# Patient Record
Sex: Female | Born: 1970 | Race: Black or African American | Hispanic: No | Smoking: Never smoker
Health system: Southern US, Community
[De-identification: ages and names within clinical notes are randomized; demographics above are authoritative.]

## PROBLEM LIST (undated history)

## (undated) DIAGNOSIS — G5603 Carpal tunnel syndrome, bilateral upper limbs: Secondary | ICD-10-CM

## (undated) DIAGNOSIS — M25731 Osteophyte, right wrist: Secondary | ICD-10-CM

## (undated) DIAGNOSIS — G4730 Sleep apnea, unspecified: Secondary | ICD-10-CM

## (undated) DIAGNOSIS — M3500 Sicca syndrome, unspecified: Secondary | ICD-10-CM

## (undated) DIAGNOSIS — M1990 Unspecified osteoarthritis, unspecified site: Secondary | ICD-10-CM

## (undated) DIAGNOSIS — K219 Gastro-esophageal reflux disease without esophagitis: Secondary | ICD-10-CM

## (undated) DIAGNOSIS — J45998 Other asthma: Secondary | ICD-10-CM

## (undated) DIAGNOSIS — T7840XA Allergy, unspecified, initial encounter: Secondary | ICD-10-CM

## (undated) DIAGNOSIS — R519 Headache, unspecified: Secondary | ICD-10-CM

## (undated) DIAGNOSIS — R51 Headache: Secondary | ICD-10-CM

## (undated) DIAGNOSIS — I10 Essential (primary) hypertension: Secondary | ICD-10-CM

## (undated) HISTORY — DX: Gastro-esophageal reflux disease without esophagitis: K21.9

## (undated) HISTORY — DX: Osteophyte, right wrist: M25.731

## (undated) HISTORY — PX: CARPAL TUNNEL RELEASE: SHX101

## (undated) HISTORY — DX: Other asthma: J45.998

## (undated) HISTORY — PX: KNEE ARTHROSCOPY: SUR90

## (undated) HISTORY — DX: Allergy, unspecified, initial encounter: T78.40XA

## (undated) HISTORY — DX: Sjogren syndrome, unspecified: M35.00

## (undated) HISTORY — DX: Sleep apnea, unspecified: G47.30

---

## 2006-01-10 ENCOUNTER — Emergency Department: Payer: Self-pay | Admitting: Emergency Medicine

## 2007-01-15 ENCOUNTER — Emergency Department: Payer: Self-pay | Admitting: Unknown Physician Specialty

## 2007-08-09 ENCOUNTER — Other Ambulatory Visit: Payer: Self-pay

## 2007-08-09 ENCOUNTER — Emergency Department: Payer: Self-pay | Admitting: Emergency Medicine

## 2008-03-21 ENCOUNTER — Ambulatory Visit: Payer: Self-pay

## 2008-10-22 ENCOUNTER — Emergency Department: Payer: Self-pay | Admitting: Internal Medicine

## 2009-01-16 ENCOUNTER — Ambulatory Visit: Payer: Self-pay | Admitting: Family Medicine

## 2009-03-01 ENCOUNTER — Ambulatory Visit: Payer: Self-pay

## 2009-03-06 ENCOUNTER — Ambulatory Visit: Payer: Self-pay

## 2017-06-03 ENCOUNTER — Emergency Department (HOSPITAL_COMMUNITY)
Admission: EM | Admit: 2017-06-03 | Discharge: 2017-06-03 | Disposition: A | Discharge status: Home / Self Care | Payer: BC Managed Care – PPO | Attending: Emergency Medicine | Admitting: Emergency Medicine

## 2017-06-03 ENCOUNTER — Encounter: Payer: Self-pay | Admitting: Emergency Medicine

## 2017-06-03 ENCOUNTER — Encounter (HOSPITAL_COMMUNITY): Payer: Self-pay | Admitting: Emergency Medicine

## 2017-06-03 DIAGNOSIS — R531 Weakness: Secondary | ICD-10-CM | POA: Insufficient documentation

## 2017-06-03 DIAGNOSIS — M5412 Radiculopathy, cervical region: Secondary | ICD-10-CM | POA: Diagnosis not present

## 2017-06-03 DIAGNOSIS — R200 Anesthesia of skin: Secondary | ICD-10-CM | POA: Diagnosis present

## 2017-06-03 DIAGNOSIS — I10 Essential (primary) hypertension: Secondary | ICD-10-CM | POA: Diagnosis not present

## 2017-06-03 DIAGNOSIS — Z5321 Procedure and treatment not carried out due to patient leaving prior to being seen by health care provider: Secondary | ICD-10-CM | POA: Diagnosis not present

## 2017-06-03 HISTORY — DX: Essential (primary) hypertension: I10

## 2017-06-03 LAB — URINALYSIS, ROUTINE W REFLEX MICROSCOPIC
Bilirubin Urine: NEGATIVE
Glucose, UA: NEGATIVE mg/dL
Hgb urine dipstick: NEGATIVE
Ketones, ur: NEGATIVE mg/dL
Leukocytes, UA: NEGATIVE
Nitrite: NEGATIVE
Protein, ur: NEGATIVE mg/dL
Specific Gravity, Urine: 1.02 (ref 1.005–1.030)
pH: 5 (ref 5.0–8.0)

## 2017-06-03 LAB — TROPONIN I: Troponin I: <0.03 ng/mL (ref <0.03)

## 2017-06-03 LAB — BASIC METABOLIC PANEL
Anion gap: 8 (ref 5–15)
BUN: 19 mg/dL (ref 6–20)
CO2: 29 mmol/L (ref 22–32)
Calcium: 9.5 mg/dL (ref 8.9–10.3)
Chloride: 100 mmol/L — ABNORMAL LOW (ref 101–111)
Creatinine, Ser: 0.9 mg/dL (ref 0.44–1.00)
GFR calc Af Amer: >60 mL/min (ref >60)
GFR calc non Af Amer: >60 mL/min (ref >60)
Glucose, Bld: 104 mg/dL — ABNORMAL HIGH (ref 65–99)
Potassium: 3.4 mmol/L — ABNORMAL LOW (ref 3.5–5.1)
Sodium: 137 mmol/L (ref 135–145)

## 2017-06-03 LAB — CBC WITH DIFFERENTIAL/PLATELET
Basophils Absolute: 0.1 10*3/uL (ref 0–0.1)
Basophils Relative: 1 %
Eosinophils Absolute: 0.1 10*3/uL (ref 0–0.7)
Eosinophils Relative: 1 %
HCT: 34.6 % — ABNORMAL LOW (ref 35.0–47.0)
Hemoglobin: 11.9 g/dL — ABNORMAL LOW (ref 12.0–16.0)
Lymphocytes Relative: 36 %
Lymphs Abs: 2.9 10*3/uL (ref 1.0–3.6)
MCH: 33 pg (ref 26.0–34.0)
MCHC: 34.5 g/dL (ref 32.0–36.0)
MCV: 95.6 fL (ref 80.0–100.0)
Monocytes Absolute: 0.6 10*3/uL (ref 0.2–0.9)
Monocytes Relative: 8 %
Neutro Abs: 4.3 10*3/uL (ref 1.4–6.5)
Neutrophils Relative %: 54 %
Platelets: 291 10*3/uL (ref 150–440)
RBC: 3.62 MIL/uL — ABNORMAL LOW (ref 3.80–5.20)
RDW: 13.8 % (ref 11.5–14.5)
WBC: 7.9 10*3/uL (ref 3.6–11.0)

## 2017-06-03 LAB — POCT PREGNANCY, URINE: Preg Test, Ur: NEGATIVE

## 2017-06-03 NOTE — ED Triage Notes (Signed)
C/o numbness and tingling to R arm and leg x 3 weeks.  Reports swelling to R knee.  Denies recent injury.  History of R knee surgery.  Also reports bruising to R 4th toe since hitting it 2 months ago.  Scheduled to have R carpal tunnel release.

## 2017-06-03 NOTE — ED Triage Notes (Addendum)
Pt presents to ED with c/o right arm and leg numbness for the past 3 weeks. Pt states she was dx with carpal tunnel with brace in place for support approx 1 month ago. Pt was informed her carpal tunnel is causing the numbness to her arm/hand. Pt also reports pain to her 4th digit on her right foot after hitting it a couple of months ago on a coffee table. Pt states the toe is slightly crooked with bruising present. Pt currently has no increased work of breathing or distress noted. Skin warm and dry. Speech clear. Denies chest pain or headache.

## 2017-06-04 ENCOUNTER — Emergency Department: Payer: BC Managed Care – PPO

## 2017-06-04 ENCOUNTER — Emergency Department
Admission: EM | Admit: 2017-06-04 | Discharge: 2017-06-04 | Disposition: A | Discharge status: Home / Self Care | Payer: BC Managed Care – PPO | Attending: Student in an Organized Health Care Education/Training Program | Admitting: Student in an Organized Health Care Education/Training Program

## 2017-06-04 DIAGNOSIS — M5412 Radiculopathy, cervical region: Secondary | ICD-10-CM

## 2017-06-04 DIAGNOSIS — R531 Weakness: Secondary | ICD-10-CM

## 2017-06-04 MED ORDER — DEXAMETHASONE SODIUM PHOSPHATE 10 MG/ML IJ SOLN
10.0000 mg | Freq: Once | INTRAMUSCULAR | Status: AC
  Administered 2017-06-04: 10 mg via INTRAVENOUS
  Filled 2017-06-04: qty 1

## 2017-06-04 MED ORDER — PREDNISONE 10 MG PO TABS: 10.0000 mg | ORAL_TABLET | Freq: Every day | ORAL | 0 refills | Status: DC

## 2017-06-04 NOTE — ED Notes (Signed)
Patient transported to MRI 

## 2017-06-04 NOTE — Discharge Instructions (Signed)
Return for worsening weakness, numbness or for any additional concerns or questions.

## 2017-06-04 NOTE — Consult Note (Signed)
Referring Physician:  No referring provider defined for this encounter.  Primary Physician:  System, Pcp Not In  Chief Complaint:  Numbness R side of body  History of Present Illness: 06/04/2017 Anita Gibbs is a 46 y.o. female who presents with the chief complaint of numbness and tingling in the R side of her body.  This began in her R arm several months ago, and affects all 5 fingers of her right hand and her forearm.  More recently, she has developed L leg numbness over the past 3-4 weeks.  She reports weakness of her R arm, particularly with opening jars and holding items. She has dropped several things.  She reports some issues with manual dexterity of the R hand.  Due to the amount of difficulty this is causing, she sought care at the [facility] ED, where MRI showed significant cervical spine findings.  I was consulted for an opinion.  In addition to above, Anita Gibbs reports some incoordination and gait difficulty, but no falls or trips.  She reports that these symptoms have been worsening.  She also reports that she has shooting pains down her R arm into her index finger. This has been going on for months.  Anita Gibbs has symptoms of cervical myelopathy - weakness of arms, gait difficulty, dropping items, hand numbness, arm pain.  She has tried bracing and >6 weeks of naproxen for conservative management. She has not had injections.  The symptoms are causing a significant impact on the patient's life.   Review of Systems:  A 10 point review of systems is negative, except for the pertinent positives and negatives detailed in the HPI.  Past Medical History: Past Medical History:  Diagnosis Date  . Hypertension     Past Surgical History: History reviewed. No pertinent surgical history.  Allergies: Allergies as of 06/03/2017  . (No Known Allergies)    Medications: No current facility-administered medications for this encounter.  No current outpatient prescriptions  on file.   Social History: Social History  Substance Use Topics  . Smoking status: Never Smoker  . Smokeless tobacco: Never Used  . Alcohol use No    Family Medical History: History reviewed. No pertinent family history.  Physical Examination: Vitals:   06/04/17 0115 06/04/17 0400  BP: 114/66 (!) 147/85  Pulse: (!) 57 62  Resp: (!) 23 19  Temp:    SpO2: 96% 100%     General: Patient is well developed, well nourished, calm, collected, and in no apparent distress.  Psychiatric: Patient is non-anxious.  Head:  Pupils equal, round, and reactive to light.  ENT:  Oral mucosa appears well hydrated.  Neck:   Supple.  Full range of motion.  Respiratory: Patient is breathing without any difficulty.  Extremities: No edema.  Vascular: Palpable pulses in dorsal pedal vessels.  Skin:   On exposed skin, there are no abnormal skin lesions.  Heart sounds normal no MRG. Chest Clear to Auscultation Bilaterally.   NEUROLOGICAL:  General: In no acute distress.   Awake, alert, oriented to person, place, and time.  Pupils equal round and reactive to light.  Facial tone is symmetric.  Tongue protrusion is midline.  There is no pronator drift.  ROM of spine: diminished extension.  Palpation of spine: nontender.    Strength: Side Biceps Triceps Deltoid Interossei Grip Wrist Ext. Wrist Flex.  R 5 5 4+ 4+ 4 5 5   L 5 5 5 5 5 5 5    Side Iliopsoas Quads Hamstring  PF DF EHL  R 5 5 5 5 5 5   L 5 5 5 5 5 5    Reflexes are 3+ and symmetric at the biceps, triceps, brachioradialis, 1+ and symmetric at patella and achilles.   Left upper and lower extremity sensation is intact to light touch and pin prick.  R arm, trunk, and leg show diminished light touch, but preserved deep touch and pain sensation. Clonus is present (2 beats).  Toes are down-going.   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  Imaging: MRI Brain and Cervical Spine 06/04/17 IMPRESSION: MRI head:  1. Limited by braces artifact.  No  acute intracranial process. 2. Partially empty sella, otherwise negative MRI of the head without contrast. MRI cervical spine:  1. 4 mm RIGHT central C4-5 and C5-6 disc protrusions compressing the spinal cord without cord edema. Small disc protrusions C6-7. 2. Moderate canal stenosis C5-6, mild to moderate C4-5 and mild at C6-7. 3. Neural foraminal narrowing C3-4 thru C5-6: Moderate on the LEFT at C4-5.   Electronically Signed   By: Elon Alas M.D.   On: 06/04/2017 03:15  I have personally reviewed the images and agree with the above interpretation with the following exceptions - there is moderate to severe stenosis at C4-5 and C5-6 due to disc herniations, and moderate stenosis at C6-7. She has myelomalacia apparent on axial imaging at these levels.  Assessment and Plan: Anita Gibbs is a pleasant 46 y.o. 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 There is also some evidence of radiculopathy affecting the R C6 nerve root.  Though I feel she meets indications for surgical intervention, she would like to try additional medications prior to making this decision.  I have recommended a steroid taper, and will see her in clinic in follow-up.  I have offered ACDF C4-7 to alleviate the pressure on her spine.  We will discuss this in clinic further after she tries additional conservative management.    Anita Gibbs K. Izora Ribas MD, [place] [facility]

## 2017-06-04 NOTE — ED Provider Notes (Addendum)
[facility] Emergency Department Provider Note    First MD Initiated Contact with Patient 06/04/17 0007     (approximate)  I have reviewed the triage vital signs and the nursing notes.   HISTORY  Chief Complaint Numbness    HPI Anita Gibbs is a 46 y.o. female with a history of hypertension presents with chief complaint of progressively worsening tingling and burning sensation on her right upper extremity and right lower extremity. States that she's been dealing with tingling in her right arm for several months and was told that she had carpal tunnel. 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 She is able to ambulate with a steady gait. No facial droop. No headaches. No trauma. She does not smoke. Denies any chest pain or shortness of breath.   Past Medical History:  Diagnosis Date  . Hypertension    History reviewed. No pertinent family history. History reviewed. No pertinent surgical history. There are no active problems to display for this patient.     Prior to Admission medications   Not on File    Allergies Patient has no known allergies.    Social History Social History  Substance Use Topics  . Smoking status: Never Smoker  . Smokeless tobacco: Never Used  . Alcohol use No    Review of Systems Patient denies headaches, rhinorrhea, blurry vision, numbness, shortness of breath, chest pain, edema, cough, abdominal pain, nausea, vomiting, diarrhea, dysuria, fevers, rashes or hallucinations unless otherwise stated above in HPI. ____________________________________________   PHYSICAL EXAM:  VITAL SIGNS: Vitals:   06/03/17 2201 06/04/17 0015  BP: (!) 152/82 (!) 123/42  Pulse: 63 61  Resp: 18 20  Temp: 97.9 F (36.6 C)   SpO2: 100% 100%    Constitutional: Alert and  oriented. Well appearing and in no acute distress. Eyes: Conjunctivae are normal.  Head: Atraumatic. Nose: No congestion/rhinnorhea. Mouth/Throat: Mucous membranes are moist.   Neck: No stridor. Painless ROM.  Cardiovascular: Normal rate, regular rhythm. Grossly normal heart sounds.  Good peripheral circulation. Respiratory: Normal respiratory effort.  No retractions. Lungs CTAB. Gastrointestinal: Soft and nontender. No distention. No abdominal bruits. No CVA tenderness. Genitourinary:  Musculoskeletal: No lower extremity tenderness nor edema.  No joint effusions. Neurologic:  Normal speech and language.CN 2-12 intact. SILT bilaterally, mild decrease grip strength and tricep extension of RUE,  Decreased plantar flexion as compared to left.  . No facial droop Skin:  Skin is warm, dry and intact. No rash noted. Psychiatric: Mood and affect are normal. Speech and behavior are normal.  ____________________________________________   LABS (all labs ordered are listed, but only abnormal results are displayed)  Results for orders placed or performed during the hospital encounter of 06/04/17 (from the past 24 hour(s))  Basic metabolic panel     Status: Abnormal   Collection Time: 06/03/17 10:11 PM  Result Value Ref Range   Sodium 137 135 - 145 mmol/L   Potassium 3.4 (L) 3.5 - 5.1 mmol/L   Chloride 100 (L) 101 - 111 mmol/L   CO2 29 22 - 32 mmol/L   Glucose, Bld 104 (H) 65 - 99 mg/dL   BUN 19 6 - 20 mg/dL   Creatinine, Ser 0.90 0.44 - 1.00 mg/dL   Calcium 9.5 8.9 - 10.3 mg/dL   GFR calc non Af Amer >  60 >60 mL/min   GFR calc Af Amer >60 >60 mL/min   Anion gap 8 5 - 15  CBC WITH DIFFERENTIAL     Status: Abnormal   Collection Time: 06/03/17 10:11 PM  Result Value Ref Range   WBC 7.9 3.6 - 11.0 K/uL   RBC 3.62 (L) 3.80 - 5.20 MIL/uL   Hemoglobin 11.9 (L) 12.0 - 16.0 g/dL   HCT 34.6 (L) 35.0 - 47.0 %   MCV 95.6 80.0 - 100.0 fL   MCH 33.0 26.0 - 34.0 pg   MCHC 34.5 32.0 - 36.0 g/dL   RDW  13.8 11.5 - 14.5 %   Platelets 291 150 - 440 K/uL   Neutrophils Relative % 54 %   Neutro Abs 4.3 1.4 - 6.5 K/uL   Lymphocytes Relative 36 %   Lymphs Abs 2.9 1.0 - 3.6 K/uL   Monocytes Relative 8 %   Monocytes Absolute 0.6 0.2 - 0.9 K/uL   Eosinophils Relative 1 %   Eosinophils Absolute 0.1 0 - 0.7 K/uL   Basophils Relative 1 %   Basophils Absolute 0.1 0 - 0.1 K/uL  Troponin I     Status: None   Collection Time: 06/03/17 10:11 PM  Result Value Ref Range   Troponin I <0.03 <0.03 ng/mL  Urinalysis, Routine w reflex microscopic     Status: Abnormal   Collection Time: 06/03/17 10:34 PM  Result Value Ref Range   Color, Urine YELLOW (A) YELLOW   APPearance CLEAR (A) CLEAR   Specific Gravity, Urine 1.020 1.005 - 1.030   pH 5.0 5.0 - 8.0   Glucose, UA NEGATIVE NEGATIVE mg/dL   Hgb urine dipstick NEGATIVE NEGATIVE   Bilirubin Urine NEGATIVE NEGATIVE   Ketones, ur NEGATIVE NEGATIVE mg/dL   Protein, ur NEGATIVE NEGATIVE mg/dL   Nitrite NEGATIVE NEGATIVE   Leukocytes, UA NEGATIVE NEGATIVE  Pregnancy, urine POC     Status: None   Collection Time: 06/03/17 10:43 PM  Result Value Ref Range   Preg Test, Ur NEGATIVE NEGATIVE   ____________________________________________  EKG My review and personal interpretation at Time: 22:01   Indication: tingling  Rate: 60  Rhythm: sinus Axis: normal  Other: normal intervals, no stemi,  ____________________________________________  RADIOLOGY  I personally reviewed all radiographic images ordered to evaluate for the above acute complaints and reviewed radiology reports and findings.  These findings were personally discussed with the patient.  Please see medical record for radiology report.  ____________________________________________   PROCEDURES  Procedure(s) performed:  Procedures    Critical Care performed: yes CRITICAL CARE Performed by: Merlyn Lot   Total critical care time: 30 minutes  Critical care time was exclusive of  separately billable procedures and treating other patients.  Critical care was necessary to treat or prevent imminent or life-threatening deterioration.  Critical care was time spent personally by me on the following activities: development of treatment plan with patient and/or surrogate as well as nursing, discussions with consultants, evaluation of patient's response to treatment, examination of patient, obtaining history from patient or surrogate, ordering and performing treatments and interventions, ordering and review of laboratory studies, ordering and review of radiographic studies, pulse oximetry and re-evaluation of patient's condition.  ____________________________________________   INITIAL IMPRESSION / ASSESSMENT AND PLAN / ED COURSE  Pertinent labs & imaging results that were available during my care of the patient were reviewed by me and considered in my medical decision making (see chart for details).  DDX: cva, tia, hypoglycemia, dehydration, electrolyte abnormality,  radiculopathy   Anita Gibbs is a 46 y.o. who presents to the ED with chief complaint of right-sided weakness as described above. Patient presentation CT imaging ordered due to concern for stroke versus fracture with evidence of herniation given her radicular type symptoms. Blood work is otherwise reassuring. CT imaging shows no evidence of acute intracranial process but her cervical spine does show evidence of mobile cord impingement secondary to disc herniation. Based on her symptoms with acute motor deficits will order MRI to further evaluate.  Clinical Course as of Jun 04 641  Thu Jun 04, 2017  0419 discussed results of MRI concerning for acute cord impingement which would correspond with her deficits. The patient is updated on plan. Spoke with Dr. Cari Caraway of neurosurgery who kindly agrees to evaluate patient for further recommendations.  [PR]    Clinical Course User Index [PR] Merlyn Lot, MD    ----------------------------------------- 6:44 AM on 06/04/2017 -----------------------------------------  Patient remains hemodynamic stable. Patient will be signed out to Dr. Reita Cliche pending consultation by neurosurgery.   ----------------------------------------- 7:12 AM on 06/04/2017 -----------------------------------------  Dr. Izora Ribas is currently evaluated patient at bedside. Based on the duration of her symptoms it is felt that she is appropriate for trial of steroid taper and follow up in clinic. Have discussed with the patient and available family all diagnostics and treatments performed thus far and all questions were answered to the best of my ability. The patient demonstrates understanding and agreement with plan.  ____________________________________________   FINAL CLINICAL IMPRESSION(S) / ED DIAGNOSES  Final diagnoses:  Cervical radiculopathy  Weakness      NEW MEDICATIONS STARTED DURING THIS VISIT:  New Prescriptions   No medications on file     Note:  This document was prepared using Dragon voice recognition software and may include unintentional dictation errors.    Merlyn Lot, MD 06/04/17 8828    Merlyn Lot, MD 06/04/17 [id number]

## 2017-06-23 ENCOUNTER — Inpatient Hospital Stay: Admission: RE | Admit: 2017-06-23 | Payer: BC Managed Care – PPO | Source: Ambulatory Visit

## 2017-06-25 ENCOUNTER — Encounter
Admission: RE | Admit: 2017-06-25 | Discharge: 2017-06-25 | Disposition: A | Discharge status: Home / Self Care | Payer: BC Managed Care – PPO | Source: Ambulatory Visit | Attending: Neurosurgery | Admitting: Neurosurgery

## 2017-06-25 DIAGNOSIS — Z01818 Encounter for other preprocedural examination: Secondary | ICD-10-CM | POA: Diagnosis present

## 2017-06-25 DIAGNOSIS — M5000 Cervical disc disorder with myelopathy, unspecified cervical region: Secondary | ICD-10-CM | POA: Diagnosis not present

## 2017-06-25 HISTORY — DX: Headache, unspecified: R51.9

## 2017-06-25 HISTORY — DX: Headache: R51

## 2017-06-25 HISTORY — DX: Unspecified osteoarthritis, unspecified site: M19.90

## 2017-06-25 HISTORY — DX: Carpal tunnel syndrome, bilateral upper limbs: G56.03

## 2017-06-25 LAB — URINALYSIS, COMPLETE (UACMP) WITH MICROSCOPIC
Bilirubin Urine: NEGATIVE
Glucose, UA: NEGATIVE mg/dL
Ketones, ur: NEGATIVE mg/dL
Leukocytes, UA: NEGATIVE
Nitrite: NEGATIVE
Protein, ur: NEGATIVE mg/dL
Specific Gravity, Urine: 1.021 (ref 1.005–1.030)
pH: 5 (ref 5.0–8.0)

## 2017-06-25 LAB — CBC
HCT: 38.2 % (ref 35.0–47.0)
Hemoglobin: 12.7 g/dL (ref 12.0–16.0)
MCH: 32.6 pg (ref 26.0–34.0)
MCHC: 33.1 g/dL (ref 32.0–36.0)
MCV: 98.3 fL (ref 80.0–100.0)
Platelets: 283 10*3/uL (ref 150–440)
RBC: 3.88 MIL/uL (ref 3.80–5.20)
RDW: 14.1 % (ref 11.5–14.5)
WBC: 5.4 10*3/uL (ref 3.6–11.0)

## 2017-06-25 LAB — BASIC METABOLIC PANEL
Anion gap: 8 (ref 5–15)
BUN: 17 mg/dL (ref 6–20)
CO2: 29 mmol/L (ref 22–32)
Calcium: 9.6 mg/dL (ref 8.9–10.3)
Chloride: 98 mmol/L — ABNORMAL LOW (ref 101–111)
Creatinine, Ser: 0.91 mg/dL (ref 0.44–1.00)
GFR calc Af Amer: >60 mL/min (ref >60)
GFR calc non Af Amer: >60 mL/min (ref >60)
Glucose, Bld: 100 mg/dL — ABNORMAL HIGH (ref 65–99)
Potassium: 3.4 mmol/L — ABNORMAL LOW (ref 3.5–5.1)
Sodium: 135 mmol/L (ref 135–145)

## 2017-06-25 LAB — PROTIME-INR
INR: 1.02
Prothrombin Time: 13.3 seconds (ref 11.4–15.2)

## 2017-06-25 LAB — SURGICAL PCR SCREEN
MRSA, PCR: NEGATIVE
Staphylococcus aureus: NEGATIVE

## 2017-06-25 LAB — TYPE AND SCREEN
ABO/RH(D): O POS
Antibody Screen: NEGATIVE

## 2017-06-25 LAB — APTT: aPTT: 30 seconds (ref 24–36)

## 2017-06-25 NOTE — Patient Instructions (Signed)
Your procedure is scheduled on: July 01, 2017 [facility] ) Report to Same Day Surgery.(MEDICAL [facility]ALL ) SECOND FLOOR To find out your arrival time please call [phone] between 1PM - 3PM on June 30, 2017 (TUESDAY )  Remember: Instructions that are not followed completely may result in serious medical risk, up to and including death, or upon the discretion of your surgeon and anesthesiologist your surgery may need to be rescheduled.     _X__ 1. Do not eat food after midnight the night before your procedure.                 No gum chewing or hard candies. You may drink clear liquids up to 2 hours                 before you are scheduled to arrive for your surgery- DO not drink clear                 liquids within 2 hours of the start of your surgery.                 Clear Liquids include:  water, apple juice without pulp, clear carbohydrate                 drink such as Clearfast of Gartorade, Black Coffee or Tea (Do not add                 anything to coffee or tea).     _X__ 2.  No Alcohol for 24 hours before or after surgery.   _X__ 3.  Do Not Smoke or use e-cigarettes For 24 Hours Prior to Your Surgery.                 Do not use any chewable tobacco products for at least 6 hours prior to                 surgery.  ____  4.  Bring all medications with you on the day of surgery if instructed.   __X__  5.  Notify your doctor if there is any change in your medical condition      (cold, fever, infections).     Do not wear jewelry, make-up, hairpins, clips or nail polish. Do not wear lotions, powders, or perfumes.  Do not shave 48 hours prior to surgery. Men may shave face and neck. Do not bring valuables to the hospital.    [facility] is not responsible for any belongings or valuables.  Contacts, dentures or bridgework may not be worn into surgery. Leave your suitcase in the car. After surgery it may be brought to your room. For patients admitted to the  hospital, discharge time is determined by your treatment team.   Patients discharged the day of surgery will not be allowed to drive home.   Please read over the following fact sheets that you were given:   MRSA Information          ____ Take these medicines the morning of surgery with A SIP OF WATER:    1.   2.   3.   4.  5.  6.  ____ Fleet Enema (as directed)   __X__ Use CHG Soap as directed  ____ Use inhalers on the day of surgery  ____ Stop metformin 2 days prior to surgery    ____ Take 1/2 of usual insulin dose the night before surgery. No insulin the morning  of surgery.   __X__ Stop Coumadin/Plavix/aspirin on (NO ASPIRIN )  __X__ Stop Anti-inflammatories on (NO ASPIRIN PRODUCTS, MOTRIN IBUPROFEN, ALEVE, ADVIL, BC'S, GOODYS, EXCEDRIN ) OK TO TAKE TYLENOL   ____ Stop supplements until after surgery.    ____ Bring C-Pap to the hospital.

## 2017-06-30 ENCOUNTER — Other Ambulatory Visit: Payer: BC Managed Care – PPO

## 2017-07-01 ENCOUNTER — Ambulatory Visit: Payer: BC Managed Care – PPO | Admitting: Certified Registered Nurse Anesthetist

## 2017-07-01 ENCOUNTER — Encounter
Admission: RE | Disposition: A | Discharge status: Home / Self Care | Payer: Self-pay | Source: Ambulatory Visit | Attending: Neurosurgery

## 2017-07-01 ENCOUNTER — Encounter: Payer: Self-pay | Admitting: *Deleted

## 2017-07-01 ENCOUNTER — Observation Stay
Admission: RE | Admit: 2017-07-01 | Discharge: 2017-07-02 | Disposition: A | Discharge status: Home / Self Care | Payer: BC Managed Care – PPO | Source: Ambulatory Visit | Attending: Neurosurgery | Admitting: Neurosurgery

## 2017-07-01 ENCOUNTER — Observation Stay: Payer: BC Managed Care – PPO

## 2017-07-01 ENCOUNTER — Ambulatory Visit: Payer: BC Managed Care – PPO

## 2017-07-01 DIAGNOSIS — G959 Disease of spinal cord, unspecified: Secondary | ICD-10-CM | POA: Diagnosis present

## 2017-07-01 DIAGNOSIS — Z981 Arthrodesis status: Secondary | ICD-10-CM

## 2017-07-01 DIAGNOSIS — I10 Essential (primary) hypertension: Secondary | ICD-10-CM | POA: Diagnosis not present

## 2017-07-01 DIAGNOSIS — Z79899 Other long term (current) drug therapy: Secondary | ICD-10-CM | POA: Insufficient documentation

## 2017-07-01 DIAGNOSIS — Z419 Encounter for procedure for purposes other than remedying health state, unspecified: Secondary | ICD-10-CM

## 2017-07-01 DIAGNOSIS — M4712 Other spondylosis with myelopathy, cervical region: Secondary | ICD-10-CM | POA: Diagnosis not present

## 2017-07-01 HISTORY — PX: ANTERIOR CERVICAL DECOMP/DISCECTOMY FUSION: SHX1161

## 2017-07-01 HISTORY — DX: Disease of spinal cord, unspecified: G95.9

## 2017-07-01 LAB — ABO/RH: ABO/RH(D): O POS

## 2017-07-01 LAB — POCT PREGNANCY, URINE: Preg Test, Ur: NEGATIVE

## 2017-07-01 SURGERY — ANTERIOR CERVICAL DECOMPRESSION/DISCECTOMY FUSION 3 LEVELS
Anesthesia: General

## 2017-07-01 MED ORDER — LACTATED RINGERS IV SOLN
INTRAVENOUS | Status: DC | PRN
  Administered 2017-07-01: 14:00:00 via INTRAVENOUS

## 2017-07-01 MED ORDER — LIDOCAINE HCL (CARDIAC) 20 MG/ML IV SOLN
INTRAVENOUS | Status: DC | PRN
  Administered 2017-07-01: 100 mg via INTRAVENOUS

## 2017-07-01 MED ORDER — LISINOPRIL-HYDROCHLOROTHIAZIDE 20-25 MG PO TABS: 1.0000 | ORAL_TABLET | Freq: Every day | ORAL | Status: DC

## 2017-07-01 MED ORDER — GELATIN ABSORBABLE 12-7 MM EX MISC
CUTANEOUS | Status: AC
  Filled 2017-07-01: qty 1

## 2017-07-01 MED ORDER — PROPOFOL 500 MG/50ML IV EMUL
INTRAVENOUS | Status: AC
  Filled 2017-07-01: qty 50

## 2017-07-01 MED ORDER — ACETAMINOPHEN 500 MG PO TABS
1000.0000 mg | ORAL_TABLET | Freq: Four times a day (QID) | ORAL | Status: AC
  Administered 2017-07-01 – 2017-07-02 (×4): 1000 mg via ORAL
  Filled 2017-07-01 (×4): qty 2

## 2017-07-01 MED ORDER — SODIUM CHLORIDE 0.9% FLUSH: 3.0000 mL | INTRAVENOUS | Status: DC | PRN

## 2017-07-01 MED ORDER — OXYCODONE HCL 5 MG PO TABS: 5.0000 mg | ORAL_TABLET | Freq: Once | ORAL | Status: DC | PRN

## 2017-07-01 MED ORDER — CEFAZOLIN SODIUM-DEXTROSE 2-4 GM/100ML-% IV SOLN
INTRAVENOUS | Status: AC
  Filled 2017-07-01: qty 100

## 2017-07-01 MED ORDER — FENTANYL CITRATE (PF) 100 MCG/2ML IJ SOLN
25.0000 ug | INTRAMUSCULAR | Status: DC | PRN
  Administered 2017-07-01 (×2): 25 ug via INTRAVENOUS
  Administered 2017-07-01 (×2): 50 ug via INTRAVENOUS

## 2017-07-01 MED ORDER — HYDROMORPHONE HCL 1 MG/ML IJ SOLN
0.5000 mg | INTRAMUSCULAR | Status: DC | PRN
  Administered 2017-07-01: 0.5 mg via INTRAVENOUS
  Filled 2017-07-01: qty 1

## 2017-07-01 MED ORDER — ACETAMINOPHEN 10 MG/ML IV SOLN
INTRAVENOUS | Status: AC
  Filled 2017-07-01: qty 100

## 2017-07-01 MED ORDER — THROMBIN (RECOMBINANT) 5000 UNITS EX SOLR
CUTANEOUS | Status: AC
  Filled 2017-07-01: qty 5000

## 2017-07-01 MED ORDER — ONDANSETRON HCL 4 MG/2ML IJ SOLN: 4.0000 mg | Freq: Four times a day (QID) | INTRAMUSCULAR | Status: DC | PRN

## 2017-07-01 MED ORDER — REMIFENTANIL HCL 1 MG IV SOLR
INTRAVENOUS | Status: AC
  Filled 2017-07-01: qty 1000

## 2017-07-01 MED ORDER — GLYCOPYRROLATE 0.2 MG/ML IJ SOLN
INTRAMUSCULAR | Status: DC | PRN
  Administered 2017-07-01: 0.2 mg via INTRAVENOUS

## 2017-07-01 MED ORDER — FENTANYL CITRATE (PF) 100 MCG/2ML IJ SOLN
INTRAMUSCULAR | Status: DC | PRN
Start: 2017-07-01 — End: 2017-07-01
  Administered 2017-07-01: 100 ug via INTRAVENOUS

## 2017-07-01 MED ORDER — OXYCODONE HCL 5 MG PO TABS: 5.0000 mg | ORAL_TABLET | ORAL | Status: DC | PRN

## 2017-07-01 MED ORDER — METHOCARBAMOL 1000 MG/10ML IJ SOLN
500.0000 mg | Freq: Four times a day (QID) | INTRAVENOUS | Status: DC | PRN
  Filled 2017-07-01: qty 5

## 2017-07-01 MED ORDER — FENTANYL CITRATE (PF) 100 MCG/2ML IJ SOLN
INTRAMUSCULAR | Status: AC
  Administered 2017-07-01: 25 ug via INTRAVENOUS
  Filled 2017-07-01: qty 2

## 2017-07-01 MED ORDER — SODIUM CHLORIDE 0.9 % IV SOLN
INTRAVENOUS | Status: DC
  Administered 2017-07-01: 19:00:00 via INTRAVENOUS

## 2017-07-01 MED ORDER — FAMOTIDINE 20 MG PO TABS
20.0000 mg | ORAL_TABLET | Freq: Once | ORAL | Status: AC
  Administered 2017-07-01: 20 mg via ORAL

## 2017-07-01 MED ORDER — ALBUTEROL SULFATE (2.5 MG/3ML) 0.083% IN NEBU: 3.0000 mL | INHALATION_SOLUTION | Freq: Four times a day (QID) | RESPIRATORY_TRACT | Status: DC | PRN

## 2017-07-01 MED ORDER — BACITRACIN 50000 UNITS IM SOLR
INTRAMUSCULAR | Status: AC
  Filled 2017-07-01: qty 1

## 2017-07-01 MED ORDER — ONDANSETRON HCL 4 MG PO TABS: 4.0000 mg | ORAL_TABLET | Freq: Four times a day (QID) | ORAL | Status: DC | PRN

## 2017-07-01 MED ORDER — PROPOFOL 10 MG/ML IV BOLUS
INTRAVENOUS | Status: AC
  Filled 2017-07-01: qty 60

## 2017-07-01 MED ORDER — FENTANYL CITRATE (PF) 100 MCG/2ML IJ SOLN
INTRAMUSCULAR | Status: AC
  Administered 2017-07-01: 50 ug via INTRAVENOUS
  Filled 2017-07-01: qty 2

## 2017-07-01 MED ORDER — FAMOTIDINE 20 MG PO TABS
ORAL_TABLET | ORAL | Status: AC
  Filled 2017-07-01: qty 1

## 2017-07-01 MED ORDER — MIDAZOLAM HCL 2 MG/2ML IJ SOLN
INTRAMUSCULAR | Status: AC
  Filled 2017-07-01: qty 2

## 2017-07-01 MED ORDER — SENNOSIDES-DOCUSATE SODIUM 8.6-50 MG PO TABS: 1.0000 | ORAL_TABLET | Freq: Every evening | ORAL | Status: DC | PRN

## 2017-07-01 MED ORDER — FENTANYL CITRATE (PF) 100 MCG/2ML IJ SOLN
INTRAMUSCULAR | Status: AC
  Filled 2017-07-01: qty 2

## 2017-07-01 MED ORDER — SODIUM CHLORIDE 0.9 % IV SOLN: 250.0000 mL | INTRAVENOUS | Status: DC

## 2017-07-01 MED ORDER — CEFAZOLIN SODIUM-DEXTROSE 2-4 GM/100ML-% IV SOLN
2.0000 g | Freq: Once | INTRAVENOUS | Status: AC
  Administered 2017-07-01: 2 g via INTRAVENOUS

## 2017-07-01 MED ORDER — ONDANSETRON HCL 4 MG/2ML IJ SOLN
INTRAMUSCULAR | Status: DC | PRN
  Administered 2017-07-01 (×2): 4 mg via INTRAVENOUS

## 2017-07-01 MED ORDER — ACETAMINOPHEN 325 MG PO TABS: 650.0000 mg | ORAL_TABLET | ORAL | Status: DC | PRN

## 2017-07-01 MED ORDER — HYDROCHLOROTHIAZIDE 25 MG PO TABS
25.0000 mg | ORAL_TABLET | Freq: Every day | ORAL | Status: DC
  Administered 2017-07-01: 25 mg via ORAL
  Filled 2017-07-01: qty 1

## 2017-07-01 MED ORDER — PROPOFOL 10 MG/ML IV BOLUS
INTRAVENOUS | Status: DC | PRN
  Administered 2017-07-01: 180 mg via INTRAVENOUS

## 2017-07-01 MED ORDER — SODIUM CHLORIDE 0.9% FLUSH
3.0000 mL | Freq: Two times a day (BID) | INTRAVENOUS | Status: DC
  Administered 2017-07-01: 3 mL via INTRAVENOUS

## 2017-07-01 MED ORDER — SODIUM CHLORIDE 0.9 % IV SOLN
INTRAVENOUS | Status: DC | PRN
  Administered 2017-07-01: 35 ug/min via INTRAVENOUS

## 2017-07-01 MED ORDER — OXYCODONE HCL 5 MG PO TABS
10.0000 mg | ORAL_TABLET | ORAL | Status: DC | PRN
  Administered 2017-07-01 – 2017-07-02 (×3): 10 mg via ORAL
  Filled 2017-07-01 (×3): qty 2

## 2017-07-01 MED ORDER — DEXAMETHASONE SODIUM PHOSPHATE 10 MG/ML IJ SOLN
INTRAMUSCULAR | Status: DC | PRN
  Administered 2017-07-01: 10 mg via INTRAVENOUS

## 2017-07-01 MED ORDER — LACTATED RINGERS IV SOLN
INTRAVENOUS | Status: DC
  Administered 2017-07-01 (×2): via INTRAVENOUS

## 2017-07-01 MED ORDER — MENTHOL 3 MG MT LOZG
1.0000 | LOZENGE | OROMUCOSAL | Status: DC | PRN
  Administered 2017-07-02: 3 mg via ORAL
  Filled 2017-07-01 (×2): qty 9

## 2017-07-01 MED ORDER — SODIUM CHLORIDE 0.9 % IV SOLN
INTRAVENOUS | Status: DC | PRN
  Administered 2017-07-01: .2 ug/kg/min via INTRAVENOUS

## 2017-07-01 MED ORDER — METHOCARBAMOL 500 MG PO TABS: 500.0000 mg | ORAL_TABLET | Freq: Four times a day (QID) | ORAL | Status: DC | PRN

## 2017-07-01 MED ORDER — SUCCINYLCHOLINE CHLORIDE 20 MG/ML IJ SOLN
INTRAMUSCULAR | Status: DC | PRN
  Administered 2017-07-01: 100 mg via INTRAVENOUS

## 2017-07-01 MED ORDER — MIDAZOLAM HCL 2 MG/2ML IJ SOLN
INTRAMUSCULAR | Status: DC | PRN
  Administered 2017-07-01: 2 mg via INTRAVENOUS

## 2017-07-01 MED ORDER — BUPIVACAINE-EPINEPHRINE (PF) 0.5% -1:200000 IJ SOLN
INTRAMUSCULAR | Status: AC
  Filled 2017-07-01: qty 30

## 2017-07-01 MED ORDER — SODIUM CHLORIDE 0.9 % IJ SOLN
INTRAMUSCULAR | Status: AC
  Filled 2017-07-01: qty 10

## 2017-07-01 MED ORDER — OXYCODONE HCL 5 MG/5ML PO SOLN: 5.0000 mg | Freq: Once | ORAL | Status: DC | PRN

## 2017-07-01 MED ORDER — ACETAMINOPHEN 10 MG/ML IV SOLN
INTRAVENOUS | Status: DC | PRN
  Administered 2017-07-01: 1000 mg via INTRAVENOUS

## 2017-07-01 MED ORDER — PROPOFOL 500 MG/50ML IV EMUL
INTRAVENOUS | Status: DC | PRN
  Administered 2017-07-01: 125 ug/kg/min via INTRAVENOUS

## 2017-07-01 MED ORDER — PHENOL 1.4 % MT LIQD
1.0000 | OROMUCOSAL | Status: DC | PRN
  Filled 2017-07-01: qty 177

## 2017-07-01 MED ORDER — LISINOPRIL 20 MG PO TABS
20.0000 mg | ORAL_TABLET | Freq: Every day | ORAL | Status: DC
  Administered 2017-07-01: 20 mg via ORAL
  Filled 2017-07-01: qty 1

## 2017-07-01 MED ORDER — ACETAMINOPHEN 650 MG RE SUPP: 650.0000 mg | RECTAL | Status: DC | PRN

## 2017-07-01 SURGICAL SUPPLY — 63 items
BAND RUBBER 3X1/6 TAN STRL (MISCELLANEOUS) ×2 IMPLANT
BLADE BOVIE TIP EXT 4 (BLADE) ×2 IMPLANT
BLADE SURG 15 STRL LF DISP TIS (BLADE) ×1 IMPLANT
BLADE SURG 15 STRL SS (BLADE) ×1
BONE MATRIX OSTEOCEL PRO SM (Bone Implant) ×2 IMPLANT
BULB RESERV EVAC DRAIN JP 100C (MISCELLANEOUS) IMPLANT
BUR NEURO DRILL SOFT 3.0X3.8M (BURR) ×2 IMPLANT
CAGE CERVICAL 14X16X7 7D (Cage) ×6 IMPLANT
CANISTER SUCT 1200ML W/VALVE (MISCELLANEOUS) ×4 IMPLANT
CHLORAPREP W/TINT 26ML (MISCELLANEOUS) ×2 IMPLANT
COUNTER NEEDLE 20/40 LG (NEEDLE) ×2 IMPLANT
COVER LIGHT HANDLE STERIS (MISCELLANEOUS) ×4 IMPLANT
CRADLE LAMINECT ARM (MISCELLANEOUS) ×2 IMPLANT
CUP MEDICINE 2OZ PLAST GRAD ST (MISCELLANEOUS) ×4 IMPLANT
DERMABOND ADVANCED (GAUZE/BANDAGES/DRESSINGS) ×1
DERMABOND ADVANCED .7 DNX12 (GAUZE/BANDAGES/DRESSINGS) ×1 IMPLANT
DRAIN CHANNEL JP 10F RND 20C F (MISCELLANEOUS) IMPLANT
DRAPE C-ARM 42X72 X-RAY (DRAPES) ×4 IMPLANT
DRAPE INCISE IOBAN 66X45 STRL (DRAPES) ×2 IMPLANT
DRAPE LAPAROTOMY 77X122 PED (DRAPES) ×2 IMPLANT
DRAPE MICROSCOPE SPINE 48X150 (DRAPES) ×2 IMPLANT
DRAPE POUCH INSTRU U-SHP 10X18 (DRAPES) ×2 IMPLANT
DRAPE SHEET LG 3/4 BI-LAMINATE (DRAPES) ×2 IMPLANT
DRAPE SURG 17X11 SM STRL (DRAPES) ×8 IMPLANT
DRAPE TABLE BACK 80X90 (DRAPES) ×2 IMPLANT
ELECT CAUTERY BLADE TIP 2.5 (TIP) ×2
ELECTRODE CAUTERY BLDE TIP 2.5 (TIP) ×1 IMPLANT
FEE INTRAOP MONITOR IMPULS NCS (MISCELLANEOUS) ×1 IMPLANT
FRAME EYE SHIELD (PROTECTIVE WEAR) ×2 IMPLANT
GAUZE SPONGE 4X4 12PLY STRL (GAUZE/BANDAGES/DRESSINGS) ×2 IMPLANT
GLOVE SURG SYN 8.5  E (GLOVE) ×3
GLOVE SURG SYN 8.5 E (GLOVE) ×3 IMPLANT
GOWN SRG XL LVL 3 NONREINFORCE (GOWNS) ×1 IMPLANT
GOWN STRL NON-REIN TWL XL LVL3 (GOWNS) ×1
GOWN STRL REUS W/ TWL LRG LVL3 (GOWN DISPOSABLE) ×1 IMPLANT
GOWN STRL REUS W/TWL LRG LVL3 (GOWN DISPOSABLE) ×1
GRADUATE 1200CC STRL 31836 (MISCELLANEOUS) ×2 IMPLANT
INTRAOP MONITOR FEE IMPULS NCS (MISCELLANEOUS) ×1
INTRAOP MONITOR FEE IMPULSE (MISCELLANEOUS) ×1
IV CATH ANGIO 12GX3 LT BLUE (NEEDLE) ×2 IMPLANT
KIT RM TURNOVER STRD PROC AR (KITS) ×2 IMPLANT
MARKER SKIN DUAL TIP RULER LAB (MISCELLANEOUS) ×4 IMPLANT
NEEDLE HYPO 22GX1.5 SAFETY (NEEDLE) ×2 IMPLANT
NS IRRIG 1000ML POUR BTL (IV SOLUTION) ×2 IMPLANT
PACK LAMINECTOMY NEURO (CUSTOM PROCEDURE TRAY) ×2 IMPLANT
PIN CASPAR 14 (PIN) ×1 IMPLANT
PIN CASPAR 14MM (PIN) ×2
PLATE ARCHON 3-LEVEL 56MM (Plate) ×2 IMPLANT
PUTTY DBM PROPEL SM (Putty) ×2 IMPLANT
SCREW ARCHON SD VAR 4.0X15MM (Screw) ×16 IMPLANT
SPOGE SURGIFLO 8M (HEMOSTASIS) ×1
SPONGE KITTNER 5P (MISCELLANEOUS) ×4 IMPLANT
SPONGE SURGIFLO 8M (HEMOSTASIS) ×1 IMPLANT
STRIP CLOSURE SKIN 1/2X4 (GAUZE/BANDAGES/DRESSINGS) IMPLANT
SUT SILK 2 0 (SUTURE) ×1
SUT SILK 2-0 18XBRD TIE 12 (SUTURE) ×1 IMPLANT
SUT V-LOC 90 ABS DVC 3-0 CL (SUTURE) ×2 IMPLANT
SUT VIC AB 3-0 SH 8-18 (SUTURE) ×2 IMPLANT
SYR 30ML LL (SYRINGE) ×2 IMPLANT
TAPE CLOTH 3X10 WHT NS LF (GAUZE/BANDAGES/DRESSINGS) ×2 IMPLANT
TOWEL OR 17X26 4PK STRL BLUE (TOWEL DISPOSABLE) ×8 IMPLANT
TRAY FOLEY W/METER SILVER 16FR (SET/KITS/TRAYS/PACK) IMPLANT
TUBING CONNECTING 10 (TUBING) ×2 IMPLANT

## 2017-07-01 NOTE — Anesthesia Post-op Follow-up Note (Signed)
Anesthesia QCDR form completed.        

## 2017-07-01 NOTE — Anesthesia Postprocedure Evaluation (Signed)
Anesthesia Post Note  Patient: Anita Gibbs  Procedure(s) Performed: ANTERIOR CERVICAL DECOMPRESSION/DISCECTOMY FUSION 3 LEVELS (N/A )  Patient location during evaluation: PACU Anesthesia Type: General Level of consciousness: awake and alert Pain management: pain level controlled Vital Signs Assessment: post-procedure vital signs reviewed and stable Respiratory status: spontaneous breathing and respiratory function stable Cardiovascular status: stable Anesthetic complications: no     Last Vitals:  Vitals:   07/01/17 1820 07/01/17 1825  BP: (!) 131/92   Pulse: 95 82  Resp: 18 (!) 24  Temp:  36.8 C  SpO2: 97% 95%    Last Pain:  Vitals:   07/01/17 1829  TempSrc:   PainSc: 3     LLE Motor Response: Purposeful movement (07/01/17 1825)   RLE Motor Response: Purposeful movement (07/01/17 1825)        Obie Silos K

## 2017-07-01 NOTE — Anesthesia Procedure Notes (Signed)
Procedure Name: Intubation Performed by: Demetrius Charity Pre-anesthesia Checklist: Patient identified, Patient being monitored, Timeout performed, Emergency Drugs available and Suction available Patient Re-evaluated:Patient Re-evaluated prior to induction Oxygen Delivery Method: Circle system utilized Preoxygenation: Pre-oxygenation with 100% oxygen Induction Type: IV induction Ventilation: Mask ventilation without difficulty Laryngoscope Size: 3 and McGraph Grade View: Grade I Tube type: Oral Tube size: 7.0 mm Number of attempts: 1 Airway Equipment and Method: Stylet and Video-laryngoscopy Placement Confirmation: ETT inserted through vocal cords under direct vision,  positive ETCO2 and breath sounds checked- equal and bilateral Secured at: 21 cm Tube secured with: Tape Dental Injury: Teeth and Oropharynx as per pre-operative assessment  Comments: Head and neck maintained in neutral position throughout intubation.

## 2017-07-01 NOTE — Anesthesia Preprocedure Evaluation (Signed)
Anesthesia Evaluation  Patient identified by MRN, date of birth, ID band Patient awake    Reviewed: Allergy & Precautions, H&P , NPO status , Patient's Chart, lab work & pertinent test results  History of Anesthesia Complications Negative for: history of anesthetic complications  Airway Mallampati: II  TM Distance: >3 FB Neck ROM: limited    Dental  (+) Chipped Braces:   Pulmonary neg shortness of breath, asthma ,           Cardiovascular Exercise Tolerance: Good hypertension, (-) angina(-) Past MI and (-) DOE      Neuro/Psych  Headaches,  Neuromuscular disease negative psych ROS   GI/Hepatic negative GI ROS, Neg liver ROS, neg GERD  ,  Endo/Other  negative endocrine ROS  Renal/GU      Musculoskeletal  (+) Arthritis ,   Abdominal   Peds  Hematology negative hematology ROS (+)   Anesthesia Other Findings Past Medical History: No date: Arthritis No date: Carpal tunnel syndrome, bilateral No date: Headache No date: Hypertension  Past Surgical History: No date: KNEE ARTHROSCOPY; Right  BMI    Body Mass Index:  41.70 kg/m      Reproductive/Obstetrics negative OB ROS                             Anesthesia Physical Anesthesia Plan  ASA: III  Anesthesia Plan: General ETT   Post-op Pain Management:    Induction: Intravenous  PONV Risk Score and Plan: 4 or greater and Ondansetron, Dexamethasone, Midazolam, Propofol infusion and Treatment may vary due to age or medical condition  Airway Management Planned: Oral ETT  Additional Equipment:   Intra-op Plan:   Post-operative Plan: Extubation in OR  Informed Consent: I have reviewed the patients History and Physical, chart, labs and discussed the procedure including the risks, benefits and alternatives for the proposed anesthesia with the patient or authorized representative who has indicated his/her understanding and acceptance.    Dental Advisory Given  Plan Discussed with: Anesthesiologist, CRNA and Surgeon  Anesthesia Plan Comments: (Patient consented for risks of anesthesia including but not limited to:  - adverse reactions to medications - damage to teeth, lips or other oral mucosa - sore throat or hoarseness - Damage to heart, brain, lungs or loss of life  Patient voiced understanding.)        Anesthesia Quick Evaluation

## 2017-07-01 NOTE — Transfer of Care (Signed)
Immediate Anesthesia Transfer of Care Note  Patient: Anita Gibbs  Procedure(s) Performed: ANTERIOR CERVICAL DECOMPRESSION/DISCECTOMY FUSION 3 LEVELS (N/A )  Patient Location: PACU  Anesthesia Type:GEN  Level of Consciousness: sedated  Airway & Oxygen Therapy: Patient Spontanous Breathing and Patient connected to face mask oxygen  Post-op Assessment: Report given to RN and Post -op Vital signs reviewed and stable  Post vital signs: Reviewed and stable  Last Vitals:  Vitals:   07/01/17 1137  BP: (!) 150/69  Pulse: 65  Resp: 16  Temp: 36.7 C  SpO2: 100%    Last Pain:  Vitals:   07/01/17 1137  TempSrc: Oral  PainSc: 7          Complications: No apparent anesthesia complications

## 2017-07-01 NOTE — Op Note (Signed)
Indications: Ms. Hugh is a 46 yo female who presented with cervical spondylosis causing myelopathy. She failed conservative management and asked for surgical intervention.  Findings: cervical spondylosis  Preoperative Diagnosis: Cervical myelopathy Postoperative Diagnosis: same   EBL: 50 ml IVF: 900 ml Drains: none Disposition: Extubated and Stable to PACU Complications: none  No foley catheter was placed.   Preoperative Note:   Risks of surgery discussed include: infection, bleeding, stroke, coma, death, paralysis, CSF leak, nerve/spinal cord injury, numbness, tingling, weakness, complex regional pain syndrome, recurrent stenosis and/or disc herniation, vascular injury, development of instability, neck/back pain, need for further surgery, persistent symptoms, development of deformity, and the risks of anesthesia. They understood these risks and have agreed to proceed.  Operative Note:   Operative Procedure: 1. Anterior Cervical Discectomy C4-5 including bilateral foraminotomies and end plate preparation  2. Anterior Cervical Discectomy C5-6 including bilateral foraminotomies and end plate preparation  3. Anterior Cervical Discectomy C6-7 including bilateral foraminotomies and end plate preparation 4. Anterior Spinal Instrumentation C4 to 7  5. Anterior arthrodesis from C4 to C7 with placement of biomechanical devices at C4-5, C5-6, and C6-7 6. Use of the operative microscope 7. Use of intraoperative flouroscopy  PROCEDURE IN DETAIL: After obtaining informed consent, the patient taken to the operating room, placed in supine position, general anesthesia induced.  The patient had a small shoulder roll placed behind their shoulders.  The patient received preop antibiotics and IV Decadron.  The patient had a neck incision outlined, was prepped and draped in usual sterile fashion. The incision was injected with local anesthetic.   An incision was opened, dissection taken down medial to  the carotid artery and jugular vein, lateral to the trachea and esophagus.  The prevertebral fascia identified and a localizing x-ray demonstrated the correct level.  The longus colli were dissected laterally, and self-retaining retractors placed to open the operative field. The microscope was then brought into the field.  With this complete, distractor pins were placed in the vertebral bodies of C4, C6, and C7. The distractor was placed from C4-6, and the anuli at C4-5 and C5-6 were opened using a bovie.  Curettes and pituitary rongeurs used to remove the majority of disk, then the drill was used to remove the posterior osteophyte and begin the foraminotomies. The nerve hook was used to elevate the posterior longitudinal ligament, which was then removed with Kerrison rongeurs. The microblunt nerve hook could be passed out the foramen bilateral at each level.   Meticulous hemostasis obtained. A biomechanical device (Nuvasive Cohere 7 mm height x 16 mm width by 14 mm depth) was placed at C4/5. A second biomechanical device (Nuvasive Cohere 7 mm height x 16 mm width by 14 mm depth) was placed at C5/6. Each device had been filled with allograft for aid in arthrodesis.  The caspar distractor was removed and placed at C6-7. The pin at C4 was removed and bone wax used for hemostasis. The C6-7 disc was opened with the bovie. Curettes and pituitary rongeurs used to remove the majority of disk, then the drill was used to remove the posterior osteophyte and begin the foraminotomies. The nerve hook was used to elevate the posterior longitudinal ligament, which was then removed with Kerrison rongeurs. The microblunt nerve hook could be passed out the foramen bilateral at each level.   Meticulous hemostasis obtained.  A biomechanical device (Nuvasive Cohere 7 mm height x 16 mm width by 14 mm depth) was placed at C6/7.    A four  segment, three level plate (56 mm Nuvasive Archon) was chosen.  Two screws placed in the  vertebral bodies of all four segments, respectively making sure the screws were behind the locking mechanism.  Final AP and lateral radiographs were taken.   With everything in good position, the wound was irrigated copiously with bacitracin-containing solution and meticulous hemostasis obtained.  Wound was closed in 2 layers using interrupted inverted 3-0 Vicryl sutures.  The wound was dressed with dermabond, the head of bed at 30 degrees, taken to recovery room in stable condition.  No new postop neurological deficits were identified..  All counts were correct at the end of the case.  I performed the entire procedure with the assistance of Marin Olp PA as an Pensions consultant.  Meade Maw MD

## 2017-07-01 NOTE — OR Nursing (Signed)
Lab tech in for abo/rh draw @ 1145 am

## 2017-07-01 NOTE — H&P (Signed)
I have reviewed and confirmed my history and physical from 06/11/17 with no additions or changes. Plan for ACDF C4-7.  Risks and benefits reviewed.       Heart sounds normal no MRG. Chest Clear to Auscultation Bilaterally.

## 2017-07-02 ENCOUNTER — Encounter: Payer: Self-pay | Admitting: Neurosurgery

## 2017-07-02 DIAGNOSIS — M4712 Other spondylosis with myelopathy, cervical region: Secondary | ICD-10-CM | POA: Diagnosis not present

## 2017-07-02 MED ORDER — OXYCODONE HCL 5 MG PO TABS: 5.0000 mg | ORAL_TABLET | ORAL | 0 refills | Status: DC | PRN

## 2017-07-02 MED ORDER — METHOCARBAMOL 500 MG PO TABS: 500.0000 mg | ORAL_TABLET | Freq: Four times a day (QID) | ORAL | 0 refills | Status: DC | PRN

## 2017-07-02 NOTE — Discharge Instructions (Signed)

## 2017-07-02 NOTE — Progress Notes (Signed)
Patient discharge summary reviewed with verbal understanding. 1 narcotic Rx given upon discharge. Escorted to personal vehicle via wc.

## 2017-07-02 NOTE — Discharge Summary (Signed)
  History: Anita Gibbs is POD1 s/p ACDF C4-C7 for cervical myelopathy. Patient tolerated procedure well. Pain has been controlled with tylenol, oxycodone, and robaxin. Currently 6/10. Patient denies issues with swallowing and is able to tolerated soft food. Voiding without issue. Denies pain, numbness, or tingling in extremities.   Physical Exam: Vitals:   07/02/17 0429 07/02/17 0825  BP: (!) 131/59 138/60  Pulse: (!) 56 69  Resp: 16   Temp: 98.4 F (36.9 C) 98 F (36.7 C)  SpO2: 100% 99%    AA Ox3 CNI Skin: Incision intact. Glue present. No drainage, minimal swelling Strength:5/5 upper and lower extremities. Sensation intact and symmetric upper and lower extremities.   Data:   Recent Labs Lab 06/25/17 1346  NA 135  K 3.4*  CL 98*  CO2 29  BUN 17  CREATININE 0.91  GLUCOSE 100*  CALCIUM 9.6   No results for input(s): AST, ALT, ALKPHOS in the last 168 hours.  Invalid input(s): TBILI    Recent Labs Lab 06/25/17 1346  WBC 5.4  HGB 12.7  HCT 38.2  PLT 283    Recent Labs Lab 06/25/17 1346  APTT 30  INR 1.02         Other tests/results:  EXAM: CERVICAL SPINE - 2-3 VIEW  COMPARISON:  None  FINDINGS: Anterior C4 through C7 fusion with interbody spacers. C7-T1 alignment is not well evaluated due to overlying osseous and soft tissue structures. Alignment otherwise maintained. Recent postsurgical change with air in the prevertebral soft tissues.  IMPRESSION: Post anterior C4 through C7 fusion with interbody spacers. No immediate postoperative complication.   Electronically Signed   By: Jeb Levering M.D.   On: 07/01/2017 23:04  Assessment/Plan:  Anita Gibbs is POD1 s/p C4-C7 ACDF. Recovery is going well. Patient request walker to assist with ambulation. Balance will be evaluated by PT and decide if PT home health is recommended at that time. Otherwise, admission was unremarkable. Pain will continue to be controlled with tylenol,  oxycodone, and robaxin. Patient advised no bending, twisting, or lifting greater than 10 pounds for 6 weeks. Contact office if any questions or concerns arise. Follow up in 2 weeks at clinic to monitor progress.  All questions were answered. Patient expressed understanding of post op instructions.   Marin Olp PA-C Department of Neurosurgery

## 2017-07-02 NOTE — Evaluation (Addendum)
Physical Therapy Evaluation Patient Details Name: GRISEL BLUMENSTOCK MRN: [id number] DOB: August 20, 1971 Today's Date: 07/02/2017   History of Present Illness  admitted for acute hospitalization status post ACDF C4-7 secondary to cervical myelopathy.  Clinical Impression  Upon evaluation, patient alert and oriented; follows all commands and demonstrates good insight/safety awareness.  Bilat UE/LE strength and ROM grossly symmetrical and WFL; denies paresthesia or radicular symptoms.  Demonstrates ability to complete bed mobility, basic transfers, gait (220' total) without assist device, stairs x4 with single rail, mod indep.  Good LE strength and control; no overt buckling or LOB.  Does require increased time for 5x sit/stand (21 seconds) due slight guarding, but able to maintain SLS 8-10 seconds bilat, negative Romberg.  Voices understanding of all education provided; comfortable with pending discharge home. Appears to be at baseline level of function and without acute indication for PT services.  Will complete order at this time.  Please re-consult should needs change.    Follow Up Recommendations No PT follow up    Equipment Recommendations       Recommendations for Other Services       Precautions / Restrictions Precautions Precautions: Cervical Restrictions Weight Bearing Restrictions: No      Mobility  Bed Mobility Overal bed mobility: Modified Independent                Transfers Overall transfer level: Modified independent Equipment used: None             General transfer comment: min use of UEs to assist with lift off   Ambulation/Gait Ambulation/Gait assistance: Modified independent (Device/Increase time) Ambulation Distance (Feet): 220 Feet Assistive device: Rolling walker (2 wheeled)       General Gait Details: RW initially utilized for patient comfort/confdience; no noted gait deviations to suggest need for RW.  RW removed and additional 120' completed  without use of RW without change in cadence, gait pattern or overall safety/stability; actually, noting improved trunk rotation, arm swing and overall fluidity  Stairs Stairs: Yes Stairs assistance: Modified independent (Device/Increase time) Stair Management: One rail Right Number of Stairs: 4 General stair comments: good LE strength/control; no buckling or LOB  Wheelchair Mobility    Modified Rankin (Stroke Patients Only)       Balance Overall balance assessment: Needs assistance Sitting-balance support: No upper extremity supported;Feet supported Sitting balance-Leahy Scale: Good     Standing balance support: No upper extremity supported Standing balance-Leahy Scale: Good   Single Leg Stance - Right Leg: 8 Single Leg Stance - Left Leg: 10                         Pertinent Vitals/Pain Pain Assessment: No/denies pain    Home Living Family/patient expects to be discharged to:: Private residence Living Arrangements: Alone Available Help at Discharge: Family (son planning to stay with patient upon discharge) Type of Home: House Home Access: Stairs to enter Entrance Stairs-Rails:  (has post to hold for stabilization as needed) Entrance Stairs-Number of Steps: 2 Home Layout: One level        Prior Function Level of Independence: Independent         Comments: Indep with ADLs, household and community mobilization without assist device; working full-time as Recruitment consultant, custodian and part-time job in [employer]        Extremity/Trunk Assessment   Upper Extremity Assessment Upper Extremity Assessment: Overall [facility] for tasks assessed  Lower Extremity Assessment Lower Extremity Assessment: Overall WFL for tasks assessed (strength grossly 5/5 bilat, denies numbness/paresthesia or any radicular symtoms)       Communication      Cognition Arousal/Alertness: Awake/alert Behavior During Therapy: WFL for tasks  assessed/performed Overall Cognitive Status: Within Functional Limits for tasks assessed                                        General Comments      Exercises Other Exercises Other Exercises: Educated in cervical precautions (patient able to repeat back), use of ice (instead of heat) during acute phase of recovery (may alternate ice/heat after first week), relaxation exercises and positioning.  Patient voiced understanding of all infomration.   Assessment/Plan    PT Assessment Patent does not need any further PT services  PT Problem List         PT Treatment Interventions      PT Goals (Current goals can be found in the Care Plan section)  Acute Rehab PT Goals Patient Stated Goal: to return home PT Goal Formulation: All assessment and education complete, DC therapy    Frequency     Barriers to discharge        Co-evaluation               AM-PAC PT "6 Clicks" Daily Activity  Outcome Measure Difficulty turning over in bed (including adjusting bedclothes, sheets and blankets)?: None Difficulty moving from lying on back to sitting on the side of the bed? : None Difficulty sitting down on and standing up from a chair with arms (e.g., wheelchair, bedside commode, etc,.)?: None Help needed moving to and from a bed to chair (including a wheelchair)?: None Help needed walking in hospital room?: None Help needed climbing 3-5 steps with a railing? : None 6 Click Score: 24    End of Session Equipment Utilized During Treatment: Gait belt Activity Tolerance: Patient tolerated treatment well Patient left: in bed;with call bell/phone within reach;with family/visitor present Nurse Communication: Mobility status PT Visit Diagnosis: Difficulty in walking, not elsewhere classified (R26.2)    Time: 1235-1250 PT Time Calculation (min) (ACUTE ONLY): 15 min   Charges:   PT Evaluation $PT Eval Low Complexity: 1 Low PT Treatments $Therapeutic Activity: 8-22  mins   PT G Codes:   PT G-Codes **NOT FOR INPATIENT CLASS** Functional Assessment Tool Used: AM-PAC 6 Clicks Basic Mobility Functional Limitation: Mobility: Walking and moving around Mobility: Walking and Moving Around Current Status (Q1975): 0 percent impaired, limited or restricted Mobility: Walking and Moving Around Goal Status (O8325): 0 percent impaired, limited or restricted Mobility: Walking and Moving Around Discharge Status (Q9826): 0 percent impaired, limited or restricted    Layanna Charo H. Owens Shark, PT, DPT, NCS 07/02/17, 4:59 PM [phone]

## 2017-07-02 NOTE — Progress Notes (Signed)
Pnt resting medicated as requested for pain and discomfort. Pnt reports pain radiated to shoulders. Applied warm blanket to pnts neck and shoulders. Pnt tol well and appears comfortable. No other issues or concerns at this time will continue to monitor and assess.

## 2017-07-02 NOTE — Care Management Note (Signed)
Case Management Note  Patient Details  Name: ANMOL PASCHEN MRN: [id number] Date of Birth: 19-Apr-1971  Subjective/Objective:  Ordered walker from [facility] PT to evlauate for home health needs                  Action/Plan: Gilford Rile from  [facility]  Expected Discharge Date:  07/02/17               Expected Discharge Plan:     In-House Referral:     Discharge planning Services  CM Consult  Post Acute Care Choice:  Durable Medical Equipment Choice offered to:  Patient  DME Arranged:  Gilford Rile rolling DME Agency:  [place]:    [facility] Agency:     Status of Service:  Completed, signed off  If discussed at [place] of Stay Meetings, dates discussed:    Additional Comments:  Jolly Mango, RN 07/02/2017, 9:34 AM

## 2018-01-21 ENCOUNTER — Other Ambulatory Visit: Payer: Self-pay | Admitting: Family Medicine

## 2018-01-21 DIAGNOSIS — N926 Irregular menstruation, unspecified: Secondary | ICD-10-CM

## 2018-01-27 ENCOUNTER — Ambulatory Visit
Admission: RE | Admit: 2018-01-27 | Discharge: 2018-01-27 | Disposition: A | Discharge status: Home / Self Care | Payer: BC Managed Care – PPO | Source: Ambulatory Visit | Attending: Family Medicine | Admitting: Family Medicine

## 2018-01-27 DIAGNOSIS — N926 Irregular menstruation, unspecified: Secondary | ICD-10-CM

## 2018-01-27 DIAGNOSIS — D259 Leiomyoma of uterus, unspecified: Secondary | ICD-10-CM | POA: Diagnosis not present

## 2018-04-28 ENCOUNTER — Emergency Department: Payer: BC Managed Care – PPO

## 2018-04-28 ENCOUNTER — Encounter: Payer: Self-pay | Admitting: Emergency Medicine

## 2018-04-28 ENCOUNTER — Emergency Department
Admission: EM | Admit: 2018-04-28 | Discharge: 2018-04-28 | Disposition: A | Discharge status: Home / Self Care | Payer: BC Managed Care – PPO | Attending: Emergency Medicine | Admitting: Emergency Medicine

## 2018-04-28 DIAGNOSIS — E876 Hypokalemia: Secondary | ICD-10-CM | POA: Insufficient documentation

## 2018-04-28 DIAGNOSIS — Z79899 Other long term (current) drug therapy: Secondary | ICD-10-CM | POA: Diagnosis not present

## 2018-04-28 DIAGNOSIS — M10072 Idiopathic gout, left ankle and foot: Secondary | ICD-10-CM | POA: Diagnosis not present

## 2018-04-28 DIAGNOSIS — I10 Essential (primary) hypertension: Secondary | ICD-10-CM | POA: Diagnosis not present

## 2018-04-28 DIAGNOSIS — R200 Anesthesia of skin: Secondary | ICD-10-CM | POA: Insufficient documentation

## 2018-04-28 DIAGNOSIS — R2242 Localized swelling, mass and lump, left lower limb: Secondary | ICD-10-CM | POA: Diagnosis present

## 2018-04-28 LAB — CBC WITH DIFFERENTIAL/PLATELET
Basophils Absolute: 0 10*3/uL (ref 0–0.1)
Basophils Relative: 1 %
Eosinophils Absolute: 0.1 10*3/uL (ref 0–0.7)
Eosinophils Relative: 1 %
HCT: 34.8 % — ABNORMAL LOW (ref 35.0–47.0)
Hemoglobin: 11.9 g/dL — ABNORMAL LOW (ref 12.0–16.0)
Lymphocytes Relative: 37 %
Lymphs Abs: 2.2 10*3/uL (ref 1.0–3.6)
MCH: 33.3 pg (ref 26.0–34.0)
MCHC: 34.3 g/dL (ref 32.0–36.0)
MCV: 97 fL (ref 80.0–100.0)
Monocytes Absolute: 0.6 10*3/uL (ref 0.2–0.9)
Monocytes Relative: 11 %
Neutro Abs: 3 10*3/uL (ref 1.4–6.5)
Neutrophils Relative %: 50 %
Platelets: 320 10*3/uL (ref 150–440)
RBC: 3.59 MIL/uL — ABNORMAL LOW (ref 3.80–5.20)
RDW: 13.6 % (ref 11.5–14.5)
WBC: 6 10*3/uL (ref 3.6–11.0)

## 2018-04-28 LAB — BASIC METABOLIC PANEL
Anion gap: 10 (ref 5–15)
BUN: 19 mg/dL (ref 6–20)
CO2: 25 mmol/L (ref 22–32)
Calcium: 9.4 mg/dL (ref 8.9–10.3)
Chloride: 104 mmol/L (ref 98–111)
Creatinine, Ser: 1.02 mg/dL — ABNORMAL HIGH (ref 0.44–1.00)
GFR calc Af Amer: >60 mL/min (ref >60)
GFR calc non Af Amer: >60 mL/min (ref >60)
Glucose, Bld: 106 mg/dL — ABNORMAL HIGH (ref 70–99)
Potassium: 3.3 mmol/L — ABNORMAL LOW (ref 3.5–5.1)
Sodium: 139 mmol/L (ref 135–145)

## 2018-04-28 LAB — URIC ACID: Uric Acid, Serum: 8 mg/dL — ABNORMAL HIGH (ref 2.5–7.1)

## 2018-04-28 MED ORDER — NAPROXEN 500 MG PO TABS: 500.0000 mg | ORAL_TABLET | Freq: Two times a day (BID) | ORAL | Status: DC

## 2018-04-28 MED ORDER — COLCHICINE 0.6 MG PO TABS
1.2000 mg | ORAL_TABLET | Freq: Once | ORAL | Status: AC
  Administered 2018-04-28: 1.2 mg via ORAL
  Filled 2018-04-28: qty 2

## 2018-04-28 MED ORDER — COLCHICINE 0.6 MG PO TABS: 0.6000 mg | ORAL_TABLET | Freq: Every day | ORAL | 0 refills | Status: DC

## 2018-04-28 MED ORDER — NAPROXEN 500 MG PO TABS
500.0000 mg | ORAL_TABLET | Freq: Once | ORAL | Status: AC
  Administered 2018-04-28: 500 mg via ORAL
  Filled 2018-04-28: qty 1

## 2018-04-28 NOTE — ED Notes (Signed)
See triage note  Presents with pain to left foot for about 3 weeks  Also has had some swelling to same

## 2018-04-28 NOTE — ED Triage Notes (Signed)
Pt ambulatory to triage. Pt reports pain and swelling to left foot for the last 3 weeks. Pt reports also some numbness to left foot. Denies injuries. Pt ambulatory and swelling noted.

## 2018-04-28 NOTE — ED Provider Notes (Signed)
[facility] Emergency Department Provider Note   ____________________________________________   First MD Initiated Contact with Patient 04/28/18 1051     (approximate)  I have reviewed the triage vital signs and the nursing notes.   HISTORY  Chief Complaint Foot Pain    HPI Anita Gibbs is a 47 y.o. female patient complain of swelling numbness to the left foot increasing over the past 2 weeks.  Patient onset was after she returned from a trip to [place].  Patient state atypical gait secondary to the pain and swelling.  Patient was seen by PCP today was sent to the emergency room for evaluation of DVT.  Patient denies chest pain or shortness of breath.  Patient rates pain/discomfort as 6/10.  Patient described pain/discomfort as "achy of intermitting numbness".   Past Medical History:  Diagnosis Date  . Arthritis   . Carpal tunnel syndrome, bilateral   . Headache   . Hypertension     Patient Active Problem List   Diagnosis Date Noted  . Cervical myelopathy ([place]) 07/01/2017    Past Surgical History:  Procedure Laterality Date  . ANTERIOR CERVICAL DECOMP/DISCECTOMY FUSION N/A 07/01/2017   Procedure: ANTERIOR CERVICAL DECOMPRESSION/DISCECTOMY FUSION 3 LEVELS;  Surgeon: Meade Maw, MD;  Location: [facility] ORS;  Service: Neurosurgery;  Laterality: N/A;  . KNEE ARTHROSCOPY Right     Prior to Admission medications   Medication Sig Start Date End Date Taking? Authorizing Provider  albuterol (PROVENTIL HFA;VENTOLIN HFA) 108 (90 Base) MCG/ACT inhaler Inhale 2 puffs into the lungs every 6 (six) hours as needed for wheezing or shortness of breath.    [provider]  aspirin-acetaminophen-caffeine (EXCEDRIN MIGRAINE) [id number] MG tablet Take 2 tablets by mouth every 6 (six) hours as needed for headache.    [provider]  colchicine 0.6 MG tablet Take 1 tablet (0.6 mg total) by mouth daily. 04/28/18 04/28/19  Sable Feil, PA-C    diphenhydrAMINE (BENADRYL) 25 mg capsule Take 25 mg by mouth every 6 (six) hours as needed (for allergies.).    [provider]  lisinopril-hydrochlorothiazide (PRINZIDE,ZESTORETIC) 20-25 MG tablet Take 1 tablet by mouth daily.    [provider]  methocarbamol (ROBAXIN) 500 MG tablet Take 1 tablet (500 mg total) by mouth every 6 (six) hours as needed for muscle spasms. 07/02/17   Marin Olp, PA-C  naproxen (NAPROSYN) 500 MG tablet Take 1 tablet (500 mg total) by mouth 2 (two) times daily with a meal. 04/28/18   Sable Feil, PA-C  oxyCODONE (OXY IR/ROXICODONE) 5 MG immediate release tablet Take 1 tablet (5 mg total) by mouth every 3 (three) hours as needed for moderate pain ((score 4 to 6)). 07/02/17   Marin Olp, PA-C    Allergies Other; Shellfish allergy; Strawberry (diagnostic); and Tomato  Family History  Problem Relation Age of Onset  . [name] Mother   . Diabetes Mother   . Hypertension Father     Social History Social History   Tobacco Use  . Smoking status: Never Smoker  . Smokeless tobacco: Never Used  Substance Use Topics  . Alcohol use: Yes    Comment: occ  . Drug use: No    Review of Systems Constitutional: No fever/chills Eyes: No visual changes. ENT: No sore throat. Cardiovascular: Denies chest pain. Respiratory: Denies shortness of breath. Gastrointestinal: No abdominal pain.  No nausea, no vomiting.  No diarrhea.  No constipation. Genitourinary: Negative for dysuria. Musculoskeletal: Left foot pain and swelling. Skin: Negative for rash.  Neurological: Negative for headaches, focal weakness or numbness. Endocrine:Hypertension Hematological/Lymphatic: Allergic/Immunilogical: Shellfish, tomatoes, and strawberries.  ____________________________________________   PHYSICAL EXAM:  VITAL SIGNS: ED Triage Vitals  Enc Vitals Group     BP 04/28/18 1043 (!) 133/54     Pulse Rate 04/28/18 1043 73     Resp 04/28/18 1043 20     Temp  04/28/18 1043 98.3 F (36.8 C)     Temp Source 04/28/18 1043 Oral     SpO2 04/28/18 1043 100 %     Weight 04/28/18 1044 220 lb (99.8 kg)     Height 04/28/18 1044 5\' 2"  (1.575 m)     Head Circumference --      Peak Flow --      Pain Score 04/28/18 1043 6     Pain Loc --      Pain Edu? --      Excl. in [place]? --    Constitutional: Alert and oriented. Well appearing and in no acute distress. Cardiovascular: Normal rate, regular rhythm. Grossly normal heart sounds.  Good peripheral circulation. Respiratory: Normal respiratory effort.  No retractions. Lungs CTAB. Musculoskeletal: No obvious deformity to the left foot. Neurologic:  Normal speech and language. No gross focal neurologic deficits are appreciated. No gait instability. Skin:  Skin is warm, dry and intact. No rash noted.  Edema erythema left foot. Psychiatric: Mood and affect are normal. Speech and behavior are normal.  ____________________________________________   LABS (all labs ordered are listed, but only abnormal results are displayed)  Labs Reviewed  BASIC METABOLIC PANEL - Abnormal; Notable for the following components:      Result Value   Potassium 3.3 (*)    Glucose, Bld 106 (*)    Creatinine, Ser 1.02 (*)    All other components within normal limits  CBC WITH DIFFERENTIAL/PLATELET - Abnormal; Notable for the following components:   RBC 3.59 (*)    Hemoglobin 11.9 (*)    HCT 34.8 (*)    All other components within normal limits  URIC ACID - Abnormal; Notable for the following components:   Uric Acid, Serum 8.0 (*)    All other components within normal limits   ____________________________________________  EKG   ____________________________________________  RADIOLOGY  ED MD interpretation:    Official radiology report(s): US Venous Img Lower Unilateral Left  Result Date: 04/28/2018 CLINICAL DATA:  47 year old female with a history of left ankle and leg pain with swelling EXAM: LEFT LOWER EXTREMITY  VENOUS DOPPLER ULTRASOUND TECHNIQUE: Gray-scale sonography with graded compression, as well as color Doppler and duplex ultrasound were performed to evaluate the lower extremity deep venous systems from the level of the common femoral vein and including the common femoral, femoral, profunda femoral, popliteal and calf veins including the posterior tibial, peroneal and gastrocnemius veins when visible. The superficial great saphenous vein was also interrogated. Spectral Doppler was utilized to evaluate flow at rest and with distal augmentation maneuvers in the common femoral, femoral and popliteal veins. COMPARISON:  None. FINDINGS: Contralateral Common Femoral Vein: Respiratory phasicity is normal and symmetric with the symptomatic side. No evidence of thrombus. Normal compressibility. Common Femoral Vein: No evidence of thrombus. Normal compressibility, respiratory phasicity and response to augmentation. Saphenofemoral Junction: No evidence of thrombus. Normal compressibility and flow on color Doppler imaging. Profunda Femoral Vein: No evidence of thrombus. Normal compressibility and flow on color Doppler imaging. Femoral Vein: No evidence of thrombus. Normal compressibility, respiratory phasicity and response to augmentation. Popliteal Vein: No evidence of thrombus. Normal  compressibility, respiratory phasicity and response to augmentation. Calf Veins: No evidence of thrombus. Normal compressibility and flow on color Doppler imaging. Superficial Great Saphenous Vein: No evidence of thrombus. Normal compressibility and flow on color Doppler imaging. Other Findings:  None. IMPRESSION: Sonographic survey of the left lower extremity negative for DVT Electronically Signed   By: Corrie Mckusick D.O.   On: 04/28/2018 12:02    ____________________________________________   PROCEDURES  Procedure(s) performed: None  Procedures  Critical Care performed:  No  ____________________________________________   INITIAL IMPRESSION / ASSESSMENT AND PLAN / ED COURSE  As part of my medical decision making, I reviewed the following data within the electronic MEDICAL RECORD NUMBER    Left foot pain and swelling secondary to gout.  Discussed negative ultrasound findings with patient.  Patient also was found to be hyperkalemic which probably secondary to her blood pressure medication.  Patient given discharge care instructions.  Patient given a prescription for colchicine and naproxen.  Patient advised to follow-up PCP with lab results to discuss mild hyperkalemia.      ____________________________________________   FINAL CLINICAL IMPRESSION(S) / ED DIAGNOSES  Final diagnoses:  Acute idiopathic gout of left foot  Hypokalemia due to loss of potassium     ED Discharge Orders         Ordered    colchicine 0.6 MG tablet  Daily     04/28/18 1259    naproxen (NAPROSYN) 500 MG tablet  2 times daily with meals     04/28/18 1300           Note:  This document was prepared using Dragon voice recognition software and may include unintentional dictation errors.    Sable Feil, PA-C 04/28/18 1306    Earleen Newport, MD 04/28/18 [phone]

## 2018-05-21 ENCOUNTER — Other Ambulatory Visit: Payer: Self-pay | Admitting: Student

## 2018-05-21 DIAGNOSIS — G959 Disease of spinal cord, unspecified: Secondary | ICD-10-CM

## 2018-05-31 ENCOUNTER — Ambulatory Visit (INDEPENDENT_AMBULATORY_CARE_PROVIDER_SITE_OTHER): Payer: BC Managed Care – PPO

## 2018-05-31 ENCOUNTER — Encounter: Payer: Self-pay | Admitting: Family Medicine

## 2018-05-31 ENCOUNTER — Ambulatory Visit: Payer: BC Managed Care – PPO | Admitting: Family Medicine

## 2018-05-31 VITALS — BP 120/68 | HR 71 | Temp 99.1°F | Ht 62.0 in | Wt 221.8 lb

## 2018-05-31 DIAGNOSIS — I10 Essential (primary) hypertension: Secondary | ICD-10-CM | POA: Diagnosis not present

## 2018-05-31 DIAGNOSIS — E875 Hyperkalemia: Secondary | ICD-10-CM

## 2018-05-31 DIAGNOSIS — R208 Other disturbances of skin sensation: Secondary | ICD-10-CM

## 2018-05-31 DIAGNOSIS — R200 Anesthesia of skin: Secondary | ICD-10-CM | POA: Insufficient documentation

## 2018-05-31 DIAGNOSIS — Z23 Encounter for immunization: Secondary | ICD-10-CM | POA: Diagnosis not present

## 2018-05-31 DIAGNOSIS — R202 Paresthesia of skin: Secondary | ICD-10-CM

## 2018-05-31 DIAGNOSIS — Z1231 Encounter for screening mammogram for malignant neoplasm of breast: Secondary | ICD-10-CM

## 2018-05-31 DIAGNOSIS — M109 Gout, unspecified: Secondary | ICD-10-CM | POA: Diagnosis not present

## 2018-05-31 DIAGNOSIS — M5030 Other cervical disc degeneration, unspecified cervical region: Secondary | ICD-10-CM

## 2018-05-31 DIAGNOSIS — M7732 Calcaneal spur, left foot: Secondary | ICD-10-CM

## 2018-05-31 HISTORY — DX: Gout, unspecified: M10.9

## 2018-05-31 HISTORY — DX: Hyperkalemia: E87.5

## 2018-05-31 HISTORY — DX: Anesthesia of skin: R20.0

## 2018-05-31 LAB — COMPREHENSIVE METABOLIC PANEL
ALT: 9 U/L (ref 0–35)
AST: 13 U/L (ref 0–37)
Albumin: 4.2 g/dL (ref 3.5–5.2)
Alkaline Phosphatase: 36 U/L — ABNORMAL LOW (ref 39–117)
BUN: 16 mg/dL (ref 6–23)
CO2: 29 mEq/L (ref 19–32)
Calcium: 9.6 mg/dL (ref 8.4–10.5)
Chloride: 103 mEq/L (ref 96–112)
Creatinine, Ser: 0.96 mg/dL (ref 0.40–1.20)
GFR: 80.87 mL/min (ref >60.00)
Glucose, Bld: 137 mg/dL — ABNORMAL HIGH (ref 70–99)
Potassium: 3.8 mEq/L (ref 3.5–5.1)
Sodium: 138 mEq/L (ref 135–145)
Total Bilirubin: 0.3 mg/dL (ref 0.2–1.2)
Total Protein: 7.9 g/dL (ref 6.0–8.3)

## 2018-05-31 LAB — LIPID PANEL
Cholesterol: 184 mg/dL (ref 0–200)
HDL: 46.9 mg/dL (ref >39.00)
LDL Cholesterol: 126 mg/dL — ABNORMAL HIGH (ref 0–99)
NonHDL: 137.08
Total CHOL/HDL Ratio: 4
Triglycerides: 56 mg/dL (ref 0.0–149.0)
VLDL: 11.2 mg/dL (ref 0.0–40.0)

## 2018-05-31 LAB — CBC WITH DIFFERENTIAL/PLATELET
Basophils Absolute: 0 10*3/uL (ref 0.0–0.1)
Basophils Relative: 0.3 % (ref 0.0–3.0)
Eosinophils Absolute: 0.1 10*3/uL (ref 0.0–0.7)
Eosinophils Relative: 1.6 % (ref 0.0–5.0)
HCT: 33.7 % — ABNORMAL LOW (ref 36.0–46.0)
Hemoglobin: 11.4 g/dL — ABNORMAL LOW (ref 12.0–15.0)
Lymphocytes Relative: 40.3 % (ref 12.0–46.0)
Lymphs Abs: 2.1 10*3/uL (ref 0.7–4.0)
MCHC: 33.8 g/dL (ref 30.0–36.0)
MCV: 97.5 fl (ref 78.0–100.0)
Monocytes Absolute: 0.3 10*3/uL (ref 0.1–1.0)
Monocytes Relative: 5.7 % (ref 3.0–12.0)
Neutro Abs: 2.8 10*3/uL (ref 1.4–7.7)
Neutrophils Relative %: 52.1 % (ref 43.0–77.0)
Platelets: 281 10*3/uL (ref 150.0–400.0)
RBC: 3.45 Mil/uL — ABNORMAL LOW (ref 3.87–5.11)
RDW: 13.9 % (ref 11.5–15.5)
WBC: 5.3 10*3/uL (ref 4.0–10.5)

## 2018-05-31 LAB — HEMOGLOBIN A1C: Hgb A1c MFr Bld: 6 % (ref 4.6–6.5)

## 2018-05-31 LAB — TSH: TSH: 1.3 u[IU]/mL (ref 0.35–4.50)

## 2018-05-31 LAB — URIC ACID: Uric Acid, Serum: 6 mg/dL (ref 2.4–7.0)

## 2018-05-31 NOTE — Progress Notes (Signed)
Subjective:    Patient ID: Anita Gibbs, female    DOB: 08/29/1971, 47 y.o.   MRN: [id number]  HPI  Presents to clinic to establish care with new PCP.   Patient's past medical history, social history, surgical history, family history reviewed and updated accordingly in chart.  Patient's main concern today is left foot pain/numbness.  She was seen at [facility] emergency department on April 29, 2018 due to left foot pain/swelling to rule out DVT.  DVT was ruled out, and patient was diagnosed with gout.  She was treated with colchicine and naproxen.  Lab work done in ER also revealed a mild hyperkalemia which was thought to be related to her blood pressure medication lisinopril.  Patient states she has finished the naproxen and colchicine, but still has feeling of numbness in her left foot.  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  She is currently following with neurology for evaluation of need for possible spinal fusion and cervical area.  Patient states she has had issues with left arm and left leg directly related to cervical spine degenerative disc disease  Blood pressure stable on lisinopril hydrochlorothiazide combination.  Patient would like flu vaccine in clinic today.  Patient Active Problem List   Diagnosis Date Noted  . Essential hypertension 05/31/2018  . Gout of left foot 05/31/2018  . Numbness of left foot 05/31/2018  . Cervical myelopathy ([place]) 07/01/2017   Past Medical History:  Diagnosis Date  . Arthritis   . Carpal tunnel syndrome, bilateral   . Headache   . Hypertension    Family History  Problem Relation Age of Onset  . [name] Mother   . Diabetes Mother   . Arthritis Mother   . Hypertension Mother   . Hyperlipidemia Mother   . Hypertension Father   . Arthritis Father   . Hearing loss Father   . Kidney disease Father   . Arthritis Sister   . Hypertension Sister   . Cancer  Brother   . COPD Brother   . Early death Brother   . COPD Son   . Depression Son    Social History   Tobacco Use  . Smoking status: Never Smoker  . Smokeless tobacco: Never Used  Substance Use Topics  . Alcohol use: Yes    Comment: occ   Past Surgical History:  Procedure Laterality Date  . ANTERIOR CERVICAL DECOMP/DISCECTOMY FUSION N/A 07/01/2017   Procedure: ANTERIOR CERVICAL DECOMPRESSION/DISCECTOMY FUSION 3 LEVELS;  Surgeon: Meade Maw, MD;  Location: [facility] ORS;  Service: Neurosurgery;  Laterality: N/A;  . KNEE ARTHROSCOPY Right     Review of Systems   Constitutional: Negative for chills, fatigue and fever.  HENT: Negative for congestion, ear pain, sinus pain and sore throat.   Eyes: Negative.   Respiratory: Negative for cough, shortness of breath and wheezing.   Cardiovascular: Negative for chest pain, palpitations and leg swelling.  Gastrointestinal: Negative for abdominal pain, diarrhea, nausea and vomiting.  Genitourinary: Negative for dysuria, frequency and urgency.  Musculoskeletal: Pain/numbness in left foot. Dx with Gout in ER 04/29/18 Skin: Negative for color change, pallor and rash.  Neurological: Negative for syncope, light-headedness and headaches.  Psychiatric/Behavioral: The patient is not nervous/anxious.       Objective:   Physical Exam  Constitutional: She appears well-developed and well-nourished. No distress.  Head: Normocephalic and atraumatic.  Eyes: Pupils are equal, round, and reactive to light. EOM are normal. No  scleral icterus.  Neck: Normal range of motion. Neck supple. No tracheal deviation present.  Cardiovascular: Normal rate, regular rhythm and normal heart sounds.  Pulmonary/Chest: Effort normal and breath sounds normal. No respiratory distress. She has no wheezes. She has no rales.  Breast exam declined in clinic today Musculoskeletal: Gait normal. ROM arms and legs normal. ROM feet, ankles, toes normal.  Neurological: She is  alert and oriented to person, place, and time. States left foot "feels numb" can feel the pressure or me touching foot, but still says it feels numb.   Gait normal  Skin: Skin is warm and dry. No pallor.  Psychiatric: She has a normal mood and affect. Her behavior is normal. Thought content normal.  Nursing note and vitals reviewed.    Vitals:   05/31/18 0926  BP: 120/68  Pulse: 71  Temp: 99.1 F (37.3 C)  SpO2: 98%   Assessment & Plan:    HTN -- Stable on current medications. We will get CBC, CMP, Lipids, TSH, A1c labs drawn today.   Gout/foot numbness -- She was diagnosed with gout in ER. We will recheck uric acid and get xray of foot due to continued c/o pain and numbness after finished treatment for gout.  Hyperkalemia -- Mild in ER. CMP collected in lab work.   DJD cervical spine -- She is following with neurosurgery  Screening mammogram ordered.  Flu vaccine given in clinic today.   Follow up in 6 months for monitoring of BP. Depending on lab results, xray results and left foot numbness - we can consider specialist referral to further evaluate.

## 2018-06-01 NOTE — Addendum Note (Signed)
Addended by: Philis Nettle on: 06/01/2018 08:41 AM   Modules accepted: Orders

## 2018-06-10 ENCOUNTER — Ambulatory Visit
Admission: RE | Admit: 2018-06-10 | Discharge: 2018-06-10 | Disposition: A | Discharge status: Home / Self Care | Payer: BC Managed Care – PPO | Source: Ambulatory Visit | Attending: Student | Admitting: Student

## 2018-06-10 DIAGNOSIS — M4802 Spinal stenosis, cervical region: Secondary | ICD-10-CM | POA: Diagnosis not present

## 2018-06-10 DIAGNOSIS — Z981 Arthrodesis status: Secondary | ICD-10-CM | POA: Insufficient documentation

## 2018-06-10 DIAGNOSIS — G959 Disease of spinal cord, unspecified: Secondary | ICD-10-CM

## 2018-06-18 ENCOUNTER — Ambulatory Visit: Payer: Self-pay | Admitting: Podiatry

## 2018-07-02 ENCOUNTER — Encounter: Payer: Self-pay | Admitting: Podiatry

## 2018-07-06 ENCOUNTER — Encounter: Payer: Self-pay | Admitting: Family Medicine

## 2018-07-07 NOTE — Progress Notes (Signed)
This encounter was created in error - please disregard.

## 2018-07-22 ENCOUNTER — Encounter: Payer: Self-pay | Admitting: Emergency Medicine

## 2018-07-22 ENCOUNTER — Emergency Department
Admission: EM | Admit: 2018-07-22 | Discharge: 2018-07-22 | Disposition: A | Discharge status: Home / Self Care | Payer: BC Managed Care – PPO | Attending: Emergency Medicine | Admitting: Emergency Medicine

## 2018-07-22 ENCOUNTER — Other Ambulatory Visit: Payer: Self-pay

## 2018-07-22 DIAGNOSIS — Z79899 Other long term (current) drug therapy: Secondary | ICD-10-CM | POA: Insufficient documentation

## 2018-07-22 DIAGNOSIS — S70361A Insect bite (nonvenomous), right thigh, initial encounter: Secondary | ICD-10-CM | POA: Diagnosis not present

## 2018-07-22 DIAGNOSIS — Y929 Unspecified place or not applicable: Secondary | ICD-10-CM | POA: Insufficient documentation

## 2018-07-22 DIAGNOSIS — I10 Essential (primary) hypertension: Secondary | ICD-10-CM | POA: Insufficient documentation

## 2018-07-22 DIAGNOSIS — Y939 Activity, unspecified: Secondary | ICD-10-CM | POA: Insufficient documentation

## 2018-07-22 DIAGNOSIS — W57XXXA Bitten or stung by nonvenomous insect and other nonvenomous arthropods, initial encounter: Secondary | ICD-10-CM | POA: Diagnosis not present

## 2018-07-22 DIAGNOSIS — Y999 Unspecified external cause status: Secondary | ICD-10-CM | POA: Insufficient documentation

## 2018-07-22 DIAGNOSIS — S80861A Insect bite (nonvenomous), right lower leg, initial encounter: Secondary | ICD-10-CM

## 2018-07-22 MED ORDER — PREDNISONE 10 MG PO TABS: 30.0000 mg | ORAL_TABLET | Freq: Every day | ORAL | 0 refills | Status: DC

## 2018-07-22 NOTE — Discharge Instructions (Addendum)
Take over-the-counter Claritin or Benadryl to help with the itching.  You can also use hydrocortisone cream which would help.  Benadryl cream.  You may continue the aloe vera.  Return if worsening

## 2018-07-22 NOTE — ED Triage Notes (Signed)
Patient reports getting insect bite on right upper leg 4 days ago. Reports continued itching since bite. No obvious swelling or redness noted to site.

## 2018-07-22 NOTE — ED Provider Notes (Signed)
[facility] Emergency Department Provider Note  ____________________________________________   First MD Initiated Contact with Patient 07/22/18 1830     (approximate)  I have reviewed the triage vital signs and the nursing notes.   HISTORY  Chief Complaint Insect Bite    HPI Anita Gibbs is a 47 y.o. female presents emergency department complaining of a bug bite to the right upper leg.   Patient states that area has been swollen and itching.  She states the swelling has decreased but the use of aloe vera.  She denies any chest pain or shortness of breath related to the bug bite.  She denies any fever or chills.   Past Medical History:  Diagnosis Date  . Arthritis   . Carpal tunnel syndrome, bilateral   . Headache   . Hypertension     Patient Active Problem List   Diagnosis Date Noted  . Essential hypertension 05/31/2018  . Gout of left foot 05/31/2018  . Numbness of left foot 05/31/2018  . Hyperkalemia 05/31/2018  . Cervical myelopathy ([place]) 07/01/2017    Past Surgical History:  Procedure Laterality Date  . ANTERIOR CERVICAL DECOMP/DISCECTOMY FUSION N/A 07/01/2017   Procedure: ANTERIOR CERVICAL DECOMPRESSION/DISCECTOMY FUSION 3 LEVELS;  Surgeon: Meade Maw, MD;  Location: [facility] ORS;  Service: Neurosurgery;  Laterality: N/A;  . KNEE ARTHROSCOPY Right     Prior to Admission medications   Medication Sig Start Date End Date Taking? Authorizing Provider  albuterol (PROVENTIL HFA;VENTOLIN HFA) 108 (90 Base) MCG/ACT inhaler Inhale 2 puffs into the lungs every 6 (six) hours as needed for wheezing or shortness of breath.    [provider]  aspirin-acetaminophen-caffeine (EXCEDRIN MIGRAINE) [phone] MG tablet Take 2 tablets by mouth every 6 (six) hours as needed for headache.    [provider]  colchicine 0.6 MG tablet Take 1 tablet (0.6 mg total) by mouth daily. 04/28/18 04/28/19  Sable Feil, PA-C  diphenhydrAMINE  (BENADRYL) 25 mg capsule Take 25 mg by mouth every 6 (six) hours as needed (for allergies.).    [provider]  gabapentin (NEURONTIN) 300 MG capsule Take 300 mg by mouth 3 (three) times daily.    [provider]  lisinopril-hydrochlorothiazide (PRINZIDE,ZESTORETIC) 20-25 MG tablet Take 1 tablet by mouth daily.    [provider]  methocarbamol (ROBAXIN) 500 MG tablet Take 1 tablet (500 mg total) by mouth every 6 (six) hours as needed for muscle spasms. 07/02/17   Marin Olp, PA-C  naproxen (NAPROSYN) 500 MG tablet Take 1 tablet (500 mg total) by mouth 2 (two) times daily with a meal. 04/28/18   Sable Feil, PA-C  oxyCODONE (OXY IR/ROXICODONE) 5 MG immediate release tablet Take 1 tablet (5 mg total) by mouth every 3 (three) hours as needed for moderate pain ((score 4 to 6)). 07/02/17   Marin Olp, PA-C  predniSONE (DELTASONE) 10 MG tablet Take 3 tablets (30 mg total) by mouth daily with breakfast. 07/22/18   Caryn Section Linden Dolin, PA-C    Allergies Other; Shellfish allergy; Strawberry (diagnostic); and Tomato  Family History  Problem Relation Age of Onset  . [name] Mother   . Diabetes Mother   . Arthritis Mother   . Hypertension Mother   . Hyperlipidemia Mother   . Hypertension Father   . Arthritis Father   . Hearing loss Father   . Kidney disease Father   . Arthritis Sister   . Hypertension Sister   . Cancer Brother   . COPD Brother   .  Early death Brother   . COPD Son   . Depression Son     Social History Social History   Tobacco Use  . Smoking status: Never Smoker  . Smokeless tobacco: Never Used  Substance Use Topics  . Alcohol use: Yes    Comment: occ  . Drug use: No    Review of Systems  Constitutional: No fever/chills Eyes: No visual changes. ENT: No sore throat. Respiratory: Denies cough Genitourinary: Negative for dysuria. Musculoskeletal: Negative for back pain. Skin: Negative for rash.  Positive for bug  bite    ____________________________________________   PHYSICAL EXAM:  VITAL SIGNS: ED Triage Vitals  Enc Vitals Group     BP 07/22/18 1812 140/67     Pulse Rate 07/22/18 1812 66     Resp 07/22/18 1812 18     Temp 07/22/18 1812 98.7 F (37.1 C)     Temp Source 07/22/18 1812 Oral     SpO2 07/22/18 1812 99 %     Weight 07/22/18 1812 218 lb (98.9 kg)     Height 07/22/18 1812 5\' 2"  (1.575 m)     Head Circumference --      Peak Flow --      Pain Score 07/22/18 1815 5     Pain Loc --      Pain Edu? --      Excl. in [place]? --     Constitutional: Alert and oriented. Well appearing and in no acute distress. Eyes: Conjunctivae are normal.  Head: Atraumatic. Nose: No congestion/rhinnorhea. Mouth/Throat: Mucous membranes are moist.   Neck:  supple no lymphadenopathy noted Cardiovascular: Normal rate, regular rhythm. Heart sounds are normal Respiratory: Normal respiratory effort.  No retractions, lungs c t a  GU: deferred Musculoskeletal: FROM all extremities, warm and well perfused Neurologic:  Normal speech and language.  Skin:  Skin is warm, dry and intact. No rash noted.  Positive for small hard area noted on the posterior right thigh.  Patient has a picture which shows hives noted around the area.  No pus or drainage is noted. Psychiatric: Mood and affect are normal. Speech and behavior are normal.  ____________________________________________   LABS (all labs ordered are listed, but only abnormal results are displayed)  Labs Reviewed - No data to display ____________________________________________   ____________________________________________  RADIOLOGY    ____________________________________________   PROCEDURES  Procedure(s) performed: No  Procedures    ____________________________________________   INITIAL IMPRESSION / ASSESSMENT AND PLAN / ED COURSE  Pertinent labs & imaging results that were available during my care of the patient were reviewed  by me and considered in my medical decision making (see chart for details).   Patient reports the emergency department with complaints of a bug bite.  Physical exam shows evidence of a recent insect bite.  Explained findings to the patient.  She is given a prescription for prednisone 30 mg daily for 4 days.  Explained to her that this will help decrease the itching and the hives.  She is to continue to use aloe vera and may also use hydrocortisone cream/Benadryl cream.  She states she understands will comply.  She is discharged in stable condition.     As part of my medical decision making, I reviewed the following data within the [place] notes reviewed and incorporated, Notes from prior ED visits and [place] Controlled Substance Database  ____________________________________________   FINAL CLINICAL IMPRESSION(S) / ED DIAGNOSES  Final diagnoses:  Insect bite of lower extremity,  right, initial encounter      NEW MEDICATIONS STARTED DURING THIS VISIT:  New Prescriptions   PREDNISONE (DELTASONE) 10 MG TABLET    Take 3 tablets (30 mg total) by mouth daily with breakfast.     Note:  This document was prepared using Dragon voice recognition software and may include unintentional dictation errors.    Versie Starks, PA-C 07/22/18 1900    Harvest Dark, MD 07/22/18 [phone]

## 2018-07-26 DIAGNOSIS — G8929 Other chronic pain: Secondary | ICD-10-CM

## 2018-07-26 DIAGNOSIS — M545 Low back pain, unspecified: Secondary | ICD-10-CM

## 2018-07-26 HISTORY — DX: Low back pain, unspecified: M54.50

## 2018-07-26 HISTORY — DX: Other chronic pain: G89.29

## 2018-08-03 ENCOUNTER — Encounter: Payer: Self-pay | Admitting: Family Medicine

## 2018-08-03 ENCOUNTER — Ambulatory Visit: Payer: BC Managed Care – PPO | Admitting: Family Medicine

## 2018-08-03 VITALS — BP 118/82 | HR 67 | Temp 98.7°F | Ht 62.0 in | Wt 226.0 lb

## 2018-08-03 DIAGNOSIS — T7840XA Allergy, unspecified, initial encounter: Secondary | ICD-10-CM

## 2018-08-03 DIAGNOSIS — Z91018 Allergy to other foods: Secondary | ICD-10-CM | POA: Diagnosis not present

## 2018-08-03 DIAGNOSIS — I10 Essential (primary) hypertension: Secondary | ICD-10-CM

## 2018-08-03 DIAGNOSIS — Z833 Family history of diabetes mellitus: Secondary | ICD-10-CM | POA: Diagnosis not present

## 2018-08-03 LAB — CBC WITH DIFFERENTIAL/PLATELET
Basophils Absolute: 0 10*3/uL (ref 0.0–0.1)
Basophils Relative: 0.5 % (ref 0.0–3.0)
Eosinophils Absolute: 0.1 10*3/uL (ref 0.0–0.7)
Eosinophils Relative: 2.3 % (ref 0.0–5.0)
HCT: 37.1 % (ref 36.0–46.0)
Hemoglobin: 12.2 g/dL (ref 12.0–15.0)
Lymphocytes Relative: 34.3 % (ref 12.0–46.0)
Lymphs Abs: 1.8 10*3/uL (ref 0.7–4.0)
MCHC: 33 g/dL (ref 30.0–36.0)
MCV: 100.6 fl — ABNORMAL HIGH (ref 78.0–100.0)
Monocytes Absolute: 0.4 10*3/uL (ref 0.1–1.0)
Monocytes Relative: 7.5 % (ref 3.0–12.0)
Neutro Abs: 3 10*3/uL (ref 1.4–7.7)
Neutrophils Relative %: 55.4 % (ref 43.0–77.0)
Platelets: 286 10*3/uL (ref 150.0–400.0)
RBC: 3.69 Mil/uL — ABNORMAL LOW (ref 3.87–5.11)
RDW: 15 % (ref 11.5–15.5)
WBC: 5.4 10*3/uL (ref 4.0–10.5)

## 2018-08-03 LAB — COMPREHENSIVE METABOLIC PANEL
ALT: 10 U/L (ref 0–35)
AST: 11 U/L (ref 0–37)
Albumin: 4.2 g/dL (ref 3.5–5.2)
Alkaline Phosphatase: 35 U/L — ABNORMAL LOW (ref 39–117)
BUN: 13 mg/dL (ref 6–23)
CO2: 29 mEq/L (ref 19–32)
Calcium: 9.6 mg/dL (ref 8.4–10.5)
Chloride: 103 mEq/L (ref 96–112)
Creatinine, Ser: 1.04 mg/dL (ref 0.40–1.20)
GFR: 73.67 mL/min (ref >60.00)
Glucose, Bld: 137 mg/dL — ABNORMAL HIGH (ref 70–99)
Potassium: 3.7 mEq/L (ref 3.5–5.1)
Sodium: 138 mEq/L (ref 135–145)
Total Bilirubin: 0.6 mg/dL (ref 0.2–1.2)
Total Protein: 7.8 g/dL (ref 6.0–8.3)

## 2018-08-03 LAB — HEMOGLOBIN A1C: Hgb A1c MFr Bld: 5.5 % (ref 4.6–6.5)

## 2018-08-03 NOTE — Progress Notes (Signed)
Subjective:    Patient ID: Anita Gibbs, female    DOB: 07-05-1971, 47 y.o.   MRN: [id number]  HPI  Presents to clinic for follow up on hand swelling. Patient was seen at [facility] ER on 08/01/18 due to left hand swelling and itching after eating jerk chicken. Patient states the symptoms started about 30 min after eating the chicken so this is why she thinks maybe she had allergic reaction. I was able to review ER note via [facility] in [place]. She was advised to take zyrtec and Pepcid to calm allergic reaction symptoms  Patient states the swelling has resolved. The itching is slightly improved, still somewhat present.   Blood pressure also is looking great on lisinopril HCTZ, tolerating this med without any issues. No CP, SOB, Leg swelling or palpitations.  Patient also wants her A1c rechecked, due to family history or diabetes she remains concerned she is diabetic.  Lab Results  Component Value Date   HGBA1C 5.5 08/03/2018    Patient Active Problem List   Diagnosis Date Noted  . Essential hypertension 05/31/2018  . Gout of left foot 05/31/2018  . Numbness of left foot 05/31/2018  . Hyperkalemia 05/31/2018  . Cervical myelopathy ([place]) 07/01/2017   Social History   Tobacco Use  . Smoking status: Never Smoker  . Smokeless tobacco: Never Used  Substance Use Topics  . Alcohol use: Yes    Comment: occ    Review of Systems  Constitutional: Negative for chills, fatigue and fever.  HENT: Negative for congestion, ear pain, sinus pain and sore throat.   Eyes: Negative.   Respiratory: Negative for cough, shortness of breath and wheezing.   Cardiovascular: Negative for chest pain, palpitations and leg swelling.  Gastrointestinal: Negative for abdominal pain, diarrhea, nausea and vomiting.  Genitourinary: Negative for dysuria, frequency and urgency.  Musculoskeletal: +hand swelling, improved Skin: Negative for color change, pallor and rash. Some left hand itching.     Neurological: Negative for syncope, light-headedness and headaches.  Psychiatric/Behavioral: The patient is not nervous/anxious.       Objective:   Physical Exam   Constitutional:  She appears well-developed and well-nourished. No distress.  HENT:  Head: Normocephalic and atraumatic.  Eyes: Pupils are equal, round, and reactive to light. EOM are normal. No scleral icterus.  Neck: Normal range of motion. Neck supple. No tracheal deviation present.  Cardiovascular: Normal rate, regular rhythm and normal heart sounds. No LE edema.  Pulmonary/Chest: Effort normal and breath sounds normal. No respiratory distress. She has no wheezes. She has no rales.  Neurological: She is alert and oriented to person, place, and time.  Musculoskeletal: Range of motion of all fingers, wrists intact.  No extremity swelling present. gait normal  Skin: Skin is warm and dry. No pallor.  No signs of rash or skin dryness. Psychiatric: She has a normal mood and affect. Her behavior is normal. Thought content normal.   Nursing note and vitals reviewed.    Today's Vitals   08/03/18 1026  BP: 118/82  Pulse: 67  Temp: 98.7 F (37.1 C)  TempSrc: Oral  SpO2: 97%  Weight: 226 lb (102.5 kg)  Height: 5\' 2"  (1.575 m)   Body mass index is 41.34 kg/m.  BP Readings from Last 3 Encounters:  08/03/18 118/82  07/22/18 140/67  05/31/18 120/68    Assessment & Plan:   Suspected food allergy/allergic reaction-patient's hand swelling and itching have both improved.  Patient advised she should continue to take Zyrtec every  day, and can use a topical Benadryl cream as needed for the remainder of itching symptoms.  Patient also is agreeable to referral to allergist due to possible food allergy.  I want to make sure we rule out allergen triggers so patient does not end up with a worse allergy reaction in the future.  Essential hypertension-patient's blood pressure is controlled beautifully on lisinopril  hydrochlorothiazide.  She will continue taking this medicine.  Family history of type 2 diabetes- patient's A1c was 6.0% in September, we will recheck a1c to further monitor this due to patient concerns she is diabetic.  Patient encouraged to follow healthy diet by increasing vegetables, decreasing carbs.  Also advised to increase her physical activity.  CBC and CMP ordered and lab work today as well  Patient will keep regularly scheduled follow-up here as planned in March 2020.  Patient is aware she can return to clinic sooner if any issues arise

## 2018-08-17 ENCOUNTER — Ambulatory Visit (INDEPENDENT_AMBULATORY_CARE_PROVIDER_SITE_OTHER): Payer: BC Managed Care – PPO | Admitting: Allergy and Immunology

## 2018-08-17 ENCOUNTER — Telehealth: Payer: Self-pay | Admitting: Family Medicine

## 2018-08-17 ENCOUNTER — Encounter: Payer: Self-pay | Admitting: Allergy and Immunology

## 2018-08-17 VITALS — BP 120/72 | HR 68 | Temp 98.5°F | Resp 20 | Ht 63.0 in | Wt 224.0 lb

## 2018-08-17 DIAGNOSIS — T783XXD Angioneurotic edema, subsequent encounter: Secondary | ICD-10-CM

## 2018-08-17 DIAGNOSIS — L500 Allergic urticaria: Secondary | ICD-10-CM | POA: Diagnosis not present

## 2018-08-17 DIAGNOSIS — T7840XD Allergy, unspecified, subsequent encounter: Secondary | ICD-10-CM | POA: Diagnosis not present

## 2018-08-17 DIAGNOSIS — Z79899 Other long term (current) drug therapy: Secondary | ICD-10-CM

## 2018-08-17 MED ORDER — LOSARTAN POTASSIUM 50 MG PO TABS: 50.0000 mg | ORAL_TABLET | Freq: Every day | ORAL | 1 refills | Status: DC

## 2018-08-17 MED ORDER — FAMOTIDINE 20 MG PO TABS: 20.0000 mg | ORAL_TABLET | Freq: Two times a day (BID) | ORAL | 2 refills | Status: DC

## 2018-08-17 MED ORDER — EPINEPHRINE 0.3 MG/0.3ML IJ SOAJ: INTRAMUSCULAR | 2 refills | Status: DC

## 2018-08-17 NOTE — Telephone Encounter (Signed)
Please advise 

## 2018-08-17 NOTE — Telephone Encounter (Signed)
Sometime this week would be OK -- I am off the few days after christmas.  If not this week then the week of new years could work too

## 2018-08-17 NOTE — Telephone Encounter (Signed)
LMTCB to make f/u appointment with Lauren.

## 2018-08-17 NOTE — Patient Instructions (Addendum)
  1.  Allergen avoidance measures  2.  Auvi-Q 0.3, Benadryl, MD/ER evaluation for allergic reaction  3.  Every day use the following medications:   A.  Cetirizine 10 mg tablet twice a day  B.  Famotidine 20 mg tablet twice a day  4.  Can add OTC Benadryl if needed  5.  Eliminate use of lisinopril and use losartan 50 mg tablet 1 time per day.  Check blood pressure and follow-up with primary care doctor about further management of hypertension  6.  Blood -alpha gal panel, shellfish panel, tomato IgE, strawberry IgE, thyroid peroxidase antibody, TSH, T4,  7.  Return to clinic in 4 weeks or earlier if problem  8.  Further evaluation?  Yes if recurrent reactions

## 2018-08-17 NOTE — Telephone Encounter (Signed)
Pt called [facility] Patient is calling and states she seen her allergist today and he changed her BP medication to losartan (COZAAR) 50 MG tablet from lisinopril-hydrochlorothiazide (PRINZIDE,ZESTORETIC) 20-25 MG tablet. She states he told her to follow up with Lauren but did not give her a time frame to follow up. Patient would like to know when would Lauren like for her to make an appt.

## 2018-08-17 NOTE — Progress Notes (Signed)
Dear Philis Nettle,  Thank you for referring Anita Gibbs to the [facility] on 08/17/2018.   Below is a summation of this patient's evaluation and recommendations.  Thank you for your referral. I will keep you informed about this patient's response to treatment.   If you have any questions please do not hesitate to contact me.   Sincerely,  Jiles Prows, MD Allergy / Immunology [place]   ______________________________________________________________________    NEW PATIENT NOTE  Referring Provider: Jodelle Green, FNP Primary Provider: Jodelle Green, FNP Date of office visit: 08/17/2018    Subjective:   Chief Complaint:  Anita Gibbs (DOB: 12-30-1970) is a 47 y.o. female who presents to the clinic on 08/17/2018 with a chief complaint of Urticaria (3 months) .     HPI: Anita Gibbs presents to this clinic in evaluation of recurrent allergic reactions.  Her history dates back about 3 months ago.  She has developed intense continuous itching for which she will use witch hazel, peroxide, and Benadryl on most days.  She has had very significant itching episodes associated with angioedema.  She will develop very red raised itchy areas that sometimes involve a very large area like the dorsal surface of her hand or her left calf.  She has ended up in the emergency room on 2 occasions in evaluation of this issue with her most recent evaluation occurring on 01 August 2018.  She has had a least 10 of these episodes.  They usually last about a week to a week and a half and then resolve without any scar or hyperpigmentation.  She has no associated systemic or constitutional symptoms when these events occur.  There is not really an obvious consistent trigger giving rise to these reactions.  She has not really had an obvious change in her environment or medications and she is not using  over-the-counter agents or health foods or herbs that may be responsible for this activity.  She has not been having any unusual GI or respiratory tract symptoms.  Over the course of the past several years she has had several food reactions.  When eating shrimp several years ago she developed diffuse urticaria.  When eating strawberries she developed facial swelling several years ago.  When eating cherry tomatoes she has broken out in hives although she can eat regular tomatoes with no problem.  Past Medical History:  Diagnosis Date  . Arthritis   . Carpal tunnel syndrome, bilateral   . Headache   . Hypertension     Past Surgical History:  Procedure Laterality Date  . ANTERIOR CERVICAL DECOMP/DISCECTOMY FUSION N/A 07/01/2017   Procedure: ANTERIOR CERVICAL DECOMPRESSION/DISCECTOMY FUSION 3 LEVELS;  Surgeon: Meade Maw, MD;  Location: [facility] ORS;  Service: Neurosurgery;  Laterality: N/A;  . KNEE ARTHROSCOPY Right     Allergies as of 08/17/2018      Reactions   Other Hives   White and red sauces   Shellfish Allergy Hives   Strawberry (diagnostic) Hives   Tomato Hives   cherry      Medication List      albuterol 108 (90 Base) MCG/ACT inhaler Commonly known as:  PROVENTIL HFA;VENTOLIN HFA Inhale 2 puffs into the lungs every 6 (six) hours as needed for wheezing or shortness of breath.   aspirin-acetaminophen-caffeine 250-250-65 MG tablet Commonly known as:  EXCEDRIN MIGRAINE Take 2 tablets by mouth every 6 (six)  hours as needed for headache.   diphenhydrAMINE 25 mg capsule Commonly known as:  BENADRYL Take 25 mg by mouth every 6 (six) hours as needed (for allergies.).   EPINEPHrine 0.3 mg/0.3 mL Soaj injection Commonly known as:  AUVI-Q Use as directed for severe allergic reaction   gabapentin 300 MG capsule Commonly known as:  NEURONTIN Take 300 mg by mouth 3 (three) times daily.   lisinopril-hydrochlorothiazide 20-25 MG tablet Commonly known as:   PRINZIDE,ZESTORETIC Take 1 tablet by mouth daily.   methocarbamol 500 MG tablet Commonly known as:  ROBAXIN Take 1 tablet (500 mg total) by mouth every 6 (six) hours as needed for muscle spasms.   naproxen 500 MG tablet Commonly known as:  NAPROSYN Take 1 tablet (500 mg total) by mouth 2 (two) times daily with a meal.   oxyCODONE 5 MG immediate release tablet Commonly known as:  Oxy IR/ROXICODONE Take 1 tablet (5 mg total) by mouth every 3 (three) hours as needed for moderate pain ((score 4 to 6)).       Review of systems negative except as noted in HPI / PMHx or noted below:  Review of Systems  Constitutional: Negative.   HENT: Negative.   Eyes: Negative.   Respiratory: Negative.   Cardiovascular: Negative.   Gastrointestinal: Negative.   Genitourinary: Negative.   Musculoskeletal: Negative.   Skin: Negative.   Neurological: Negative.   Endo/Heme/Allergies: Negative.   Psychiatric/Behavioral: Negative.     Family History  Problem Relation Age of Onset  . [name] Mother   . Diabetes Mother   . Arthritis Mother   . Hypertension Mother   . Hyperlipidemia Mother   . Hypertension Father   . Arthritis Father   . Hearing loss Father   . Kidney disease Father   . Arthritis Sister   . Hypertension Sister   . Cancer Brother   . COPD Brother   . Early death Brother   . COPD Son   . Depression Son   . Allergic rhinitis Neg Hx   . Angioedema Neg Hx   . Eczema Neg Hx   . Urticaria Neg Hx   . Asthma Neg Hx     Social History   Socioeconomic History  . Marital status: Widowed    Spouse name: Not on file  . Number of children: Not on file  . Years of education: Not on file  . Highest education level: Not on file  Occupational History  . Not on file  Social Needs  . Financial resource strain: Not on file  . Food insecurity:    Worry: Not on file    Inability: Not on file  . Transportation needs:    Medical: Not on file    Non-medical: Not on file  Tobacco  Use  . Smoking status: Never Smoker  . Smokeless tobacco: Never Used  Substance and Sexual Activity  . Alcohol use: Yes    Comment: occ  . Drug use: No  . Sexual activity: Yes    Birth control/protection: None  Lifestyle  . Physical activity:    Days per week: Not on file    Minutes per session: Not on file  . Stress: Not on file  Relationships  . Social connections:    Talks on phone: Not on file    Gets together: Not on file    Attends religious service: Not on file    Active member of club or organization: Not on file    Attends meetings  of clubs or organizations: Not on file    Relationship status: Not on file  . Intimate partner violence:    Fear of current or ex partner: Not on file    Emotionally abused: Not on file    Physically abused: Not on file    Forced sexual activity: Not on file  Other Topics Concern  . Not on file  Social History Narrative  . Not on file    Environmental and Social history  Lives in a house with a dry environment, cats located inside the household, no carpet in the bedroom, no plastic on the bed, no plastic on the pillows, no smokers located inside the household.  Objective:   Vitals:   08/17/18 0900  BP: 120/72  Pulse: 68  Resp: 20  Temp: 98.5 F (36.9 C)  SpO2: 97%   Height: 5\' 3"  (160 cm) Weight: 224 lb (101.6 kg)  Physical Exam Constitutional:      Appearance: She is not diaphoretic.  HENT:     Head: Normocephalic. No right periorbital erythema or left periorbital erythema.     Right Ear: Tympanic membrane, ear canal and external ear normal.     Left Ear: Tympanic membrane, ear canal and external ear normal.     Nose: Nose normal. No mucosal edema or rhinorrhea.     Mouth/Throat:     Pharynx: No oropharyngeal exudate.  Eyes:     General: Lids are normal.     Conjunctiva/sclera: Conjunctivae normal.     Pupils: Pupils are equal, round, and reactive to light.  Neck:     Thyroid: No thyromegaly.     Trachea: Trachea  normal. No tracheal deviation.  Cardiovascular:     Rate and Rhythm: Normal rate and regular rhythm.     Heart sounds: Normal heart sounds, S1 normal and S2 normal. No murmur.  Pulmonary:     Effort: Pulmonary effort is normal. No respiratory distress.     Breath sounds: No stridor. No wheezing or rales.  Chest:     Chest wall: No tenderness.  Abdominal:     General: There is no distension.     Palpations: Abdomen is soft. There is no mass.     Tenderness: There is no abdominal tenderness. There is no guarding or rebound.  Musculoskeletal:        General: No tenderness.  Lymphadenopathy:     Head:     Right side of head: No tonsillar adenopathy.     Left side of head: No tonsillar adenopathy.     Cervical: No cervical adenopathy.  Skin:    Coloration: Skin is not pale.     Findings: No erythema or rash.     Nails: There is no clubbing.   Neurological:     Mental Status: She is alert.     Diagnostics: Allergy skin tests were performed.  She demonstrated hypersensitivity to grasses, trees, weeds.  She did not demonstrate any hypersensitivity against the screening panel of foods.  Results of blood tests obtained 03 August 2018 identify creatinine 1.04 mg/DL, AST 11 U/L, ALT 10 U/L, WBC 6.4, absolute eosinophil 100, absolute lymphocyte 1800, hemoglobin 12.2, platelet 286  Results of blood tests obtained 31 May 2018 identified TSH 1.30 uIU/mL   Assessment and Plan:    1. Allergic reaction, subsequent encounter   2. Allergic urticaria   3. Angioedema, subsequent encounter   4. On angiotensin-converting enzyme (ACE) inhibitors     1.  Allergen avoidance measures  2.  Auvi-Q 0.3, Benadryl, MD/ER evaluation for allergic reaction  3.  Every day use the following medications:   A.  Cetirizine 10 mg tablet twice a day  B.  Famotidine 20 mg tablet twice a day  4.  Can add OTC Benadryl if needed  5.  Eliminate use of lisinopril and use losartan 50 mg tablet 1 time  per day.  Check blood pressure and follow-up with primary care doctor about further management of hypertension  6.  Blood -alpha gal panel, shellfish panel, tomato IgE, strawberry IgE, thyroid peroxidase antibody, TSH, T4,  7.  Return to clinic in 4 weeks or earlier if problem  8.  Further evaluation?  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  It is quite possible that she is having sensitivity to consumption of various foods based upon their content of pollen associated proteins.  For now we will have her consistently use an H1 and H2 receptor blocker to see if we can raise her threshold for immunological hyperreactivity.  As well, we will have her undergo further evaluation for atopic disease and other systemic disease contributing to her immunological hyperactivity as noted above.  She has had several allergic reactions and the use of an ACE inhibitor is a contraindication in the setting of recurrent reactions and we will give her a ARB to replace her ACE inhibitor and she can follow-up with her primary care doctor regarding further evaluation for this issue.  I will see her back in this clinic in 4 weeks or earlier if there is a problem.  Jiles Prows, MD Allergy / Immunology [facility]

## 2018-08-17 NOTE — Telephone Encounter (Signed)
Copied from [place] [id number]. Topic: Quick Communication - See Telephone Encounter >> Aug 17, 2018 10:55 AM Conception Chancy, NT wrote: CRM for notification. See Telephone encounter for: 08/17/18.  Patient is calling and states she seen her allergist today and he changed her BP medication to losartan (COZAAR) 50 MG tablet from lisinopril-hydrochlorothiazide (PRINZIDE,ZESTORETIC) 20-25 MG tablet. She states he told her to follow up with Lauren but did not give her a time frame to follow up. Patient would like to know when would Lauren like for her to make an appt.

## 2018-08-17 NOTE — Telephone Encounter (Signed)
Called Pt and scheduled an appointment for 08/24/2018 @ 9:00am

## 2018-08-18 ENCOUNTER — Encounter: Payer: Self-pay | Admitting: Allergy and Immunology

## 2018-08-19 ENCOUNTER — Telehealth: Payer: Self-pay | Admitting: Allergy and Immunology

## 2018-08-19 MED ORDER — EPINEPHRINE 0.3 MG/0.3ML IJ SOAJ: INTRAMUSCULAR | 2 refills | Status: AC

## 2018-08-19 NOTE — Telephone Encounter (Signed)
Patient was seen 08-17-18 and was told a prescription for Auvi-Q would be sent to the pharmacy and if she didn't hear anything for a couple days, to call the pharmacy. She called them today, and they told her they never received it. It shows they did, but they told her they didn't. I told her I would like you all know.

## 2018-08-19 NOTE — Telephone Encounter (Signed)
Called and spoke with patient and it looks like her Auvi-q was sent to the wrong pharmacy. Rx has been sent to [facility] and patient will call this afternoon to set up delivery.

## 2018-08-20 LAB — T4, FREE: Free T4: 1.33 ng/dL (ref 0.82–1.77)

## 2018-08-20 LAB — ALPHA-GAL PANEL
Alpha Gal IgE*: 0.13 kU/L — ABNORMAL HIGH (ref <0.10)
Beef (Bos spp) IgE: <0.1 kU/L (ref <0.35)
Class Interpretation: 0
Class Interpretation: 0
Class Interpretation: 0
Lamb/Mutton (Ovis spp) IgE: <0.1 kU/L (ref <0.35)
Pork (Sus spp) IgE: <0.1 kU/L (ref <0.35)

## 2018-08-20 LAB — ALLERGEN PROFILE, SHELLFISH
Clam IgE: <0.1 kU/L
F023-IgE Crab: <0.1 kU/L
F080-IgE Lobster: <0.1 kU/L
F290-IgE Oyster: <0.1 kU/L
Scallop IgE: <0.1 kU/L
Shrimp IgE: <0.1 kU/L

## 2018-08-20 LAB — THYROID PEROXIDASE ANTIBODY: Thyroperoxidase Ab SerPl-aCnc: 14 IU/mL (ref 0–34)

## 2018-08-20 LAB — ALLERGEN, TOMATO F25: Allergen Tomato, IgE: <0.1 kU/L

## 2018-08-20 LAB — TSH: TSH: 1.39 u[IU]/mL (ref 0.450–4.500)

## 2018-08-20 LAB — ALLERGEN, STRAWBERRY, F44: Allergen Strawberry IgE: <0.1 kU/L

## 2018-08-24 ENCOUNTER — Ambulatory Visit: Payer: BC Managed Care – PPO | Admitting: Family Medicine

## 2018-08-24 DIAGNOSIS — Z0289 Encounter for other administrative examinations: Secondary | ICD-10-CM

## 2018-08-24 NOTE — Progress Notes (Deleted)
   Subjective:    Patient ID: Anita Gibbs, female    DOB: 10/04/1970, 47 y.o.   MRN: [id number]  HPI  Presents to clinic to discuss BP medication  Patient Active Problem List   Diagnosis Date Noted  . Essential hypertension 05/31/2018  . Gout of left foot 05/31/2018  . Numbness of left foot 05/31/2018  . Hyperkalemia 05/31/2018  . Cervical myelopathy ([place]) 07/01/2017   Social History   Tobacco Use  . Smoking status: Never Smoker  . Smokeless tobacco: Never Used  Substance Use Topics  . Alcohol use: Yes    Comment: occ    Review of Systems  Constitutional: Negative for chills, fatigue and fever.  HENT: Negative for congestion, ear pain, sinus pain and sore throat.   Eyes: Negative.   Respiratory: Negative for cough, shortness of breath and wheezing.   Cardiovascular: Negative for chest pain, palpitations and leg swelling.  Gastrointestinal: Negative for abdominal pain, diarrhea, nausea and vomiting.  Genitourinary: Negative for dysuria, frequency and urgency.  Musculoskeletal: Negative for arthralgias and myalgias.  Skin: Negative for color change, pallor and rash.  Neurological: Negative for syncope, light-headedness and headaches.  Psychiatric/Behavioral: The patient is not nervous/anxious.       Objective:   Physical Exam        Assessment & Plan:

## 2018-09-02 ENCOUNTER — Other Ambulatory Visit: Payer: Self-pay | Admitting: Family Medicine

## 2018-09-02 DIAGNOSIS — Z1231 Encounter for screening mammogram for malignant neoplasm of breast: Secondary | ICD-10-CM

## 2018-09-03 IMAGING — MR MR CERVICAL SPINE W/O CM
5 series · 40 of 48 positions shown · non-contrast
Comparison: CT HEAD and cervical spine June 04, 2017 at 3366
hours

CLINICAL DATA: RIGHT arm and RIGHT leg numbness for 3 weeks. RIGHT
carpal tunnel.

EXAM:
MRI HEAD WITHOUT CONTRAST
MRI CERVICAL SPINE WITHOUT CONTRAST
TECHNIQUE: Multiplanar, multiecho pulse sequences of the brain and surrounding
structures, and cervical spine, to include the craniocervical
junction and cervicothoracic junction, were obtained without
intravenous contrast.

[Series 2: T2 · sagittal · 3.0mm · 0.70mm/px · 7 of 15 slices shown (1 of 2)]
[im 1/15]
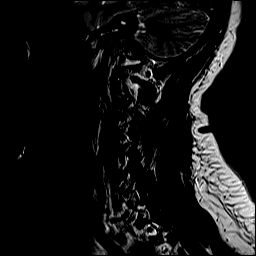
[im 3/15]
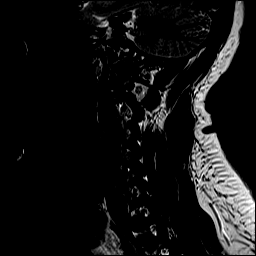
[im 5/15]
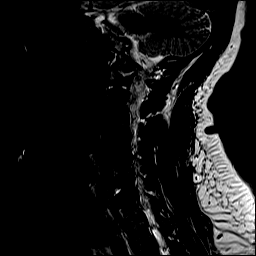
[im 8/15]
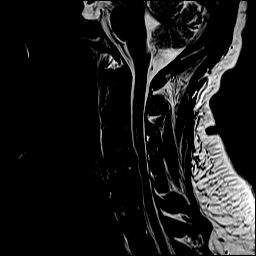
[im 10/15]
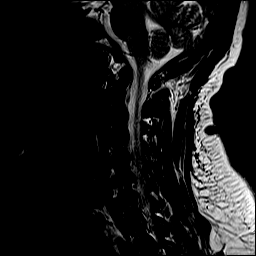
[im 12/15]
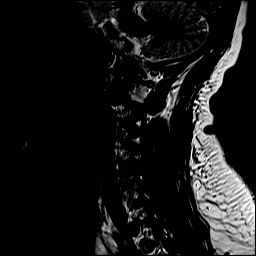
[im 15/15]
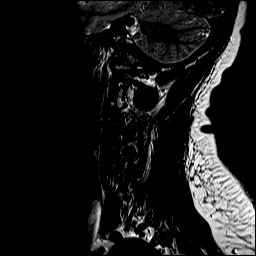

[Series 3: T1 · sagittal · 3.0mm · 0.70mm/px · 7 of 15 slices shown]
[im 1/15]
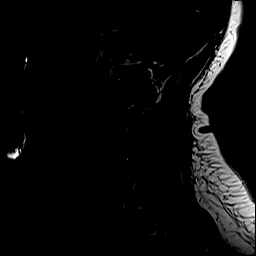
[im 3/15]
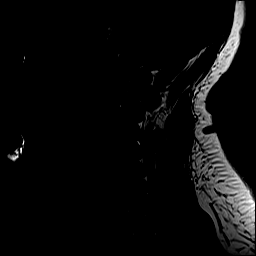
[im 5/15]
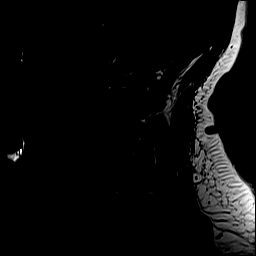
[im 8/15]
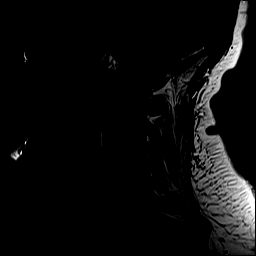
[im 10/15]
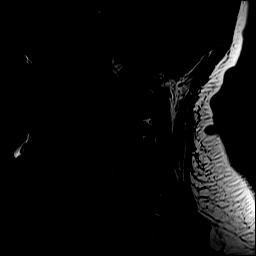
[im 12/15]
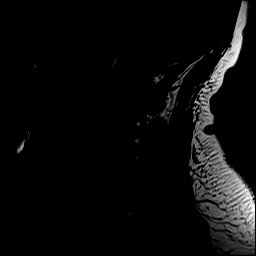
[im 15/15]
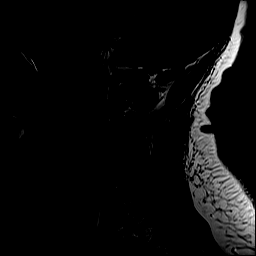

[Series 4: STIR · sagittal · 3.0mm · 0.70mm/px · 8 of 15 slices shown]
[im 1/15]
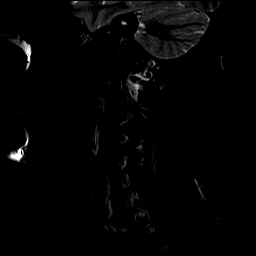
[im 3/15]
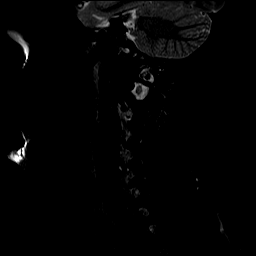
[im 5/15]
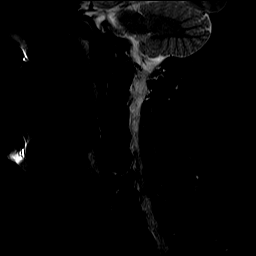
[im 7/15]
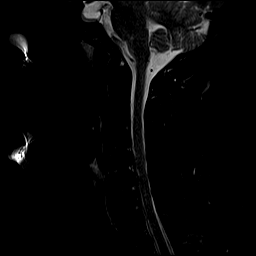
[im 9/15]
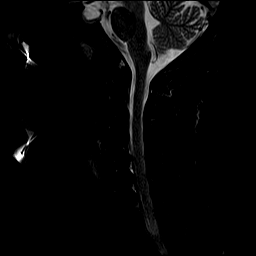
[im 11/15]
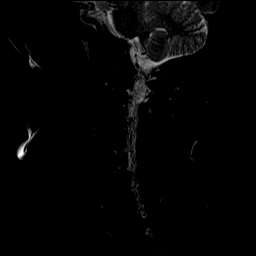
[im 13/15]
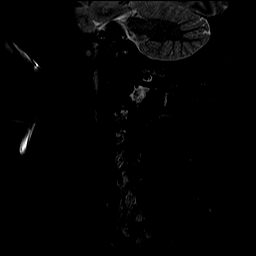
[im 15/15]
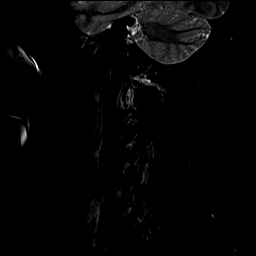

[Series 5: T2 · axial · 3.0mm · 0.70mm/px · z∈[-196,-105]mm · 10 of 25 slices shown (2 of 2)]
[im 1/25]
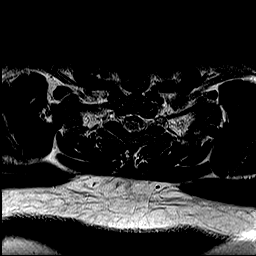
[im 3/25]
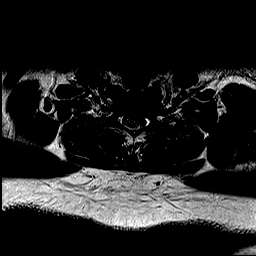
[im 5/25]
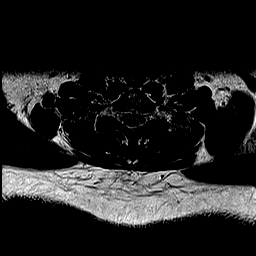
[im 9/25]
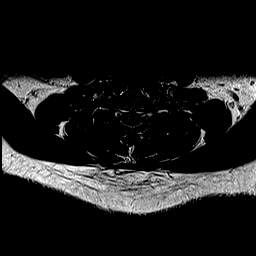
[im 11/25]
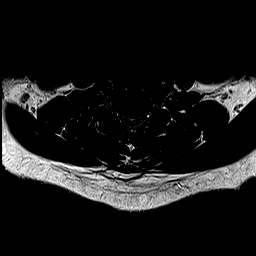
[im 13/25]
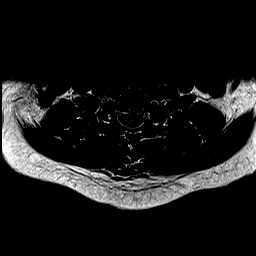
[im 15/25]
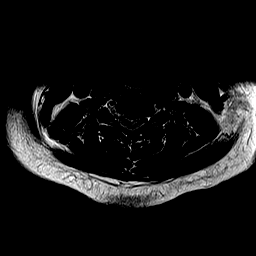
[im 17/25]
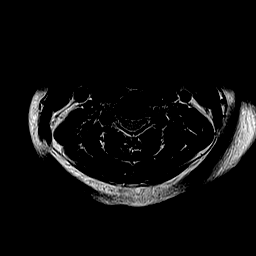
[im 21/25]
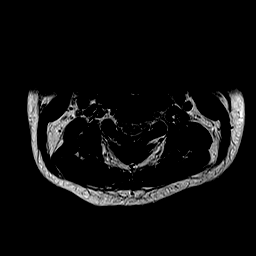
[im 25/25]
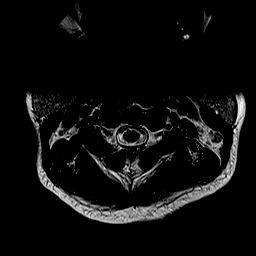

[Series 6: mpgr ax · axial · 3.0mm · 0.35mm/px · z∈[-196,-105]mm · 8 of 25 slices shown]
[im 1/25]
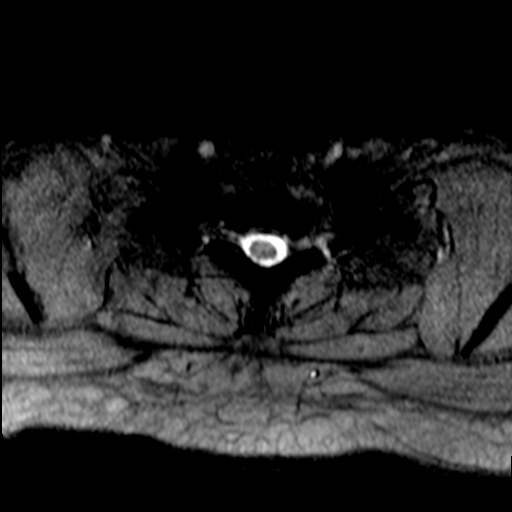
[im 5/25]
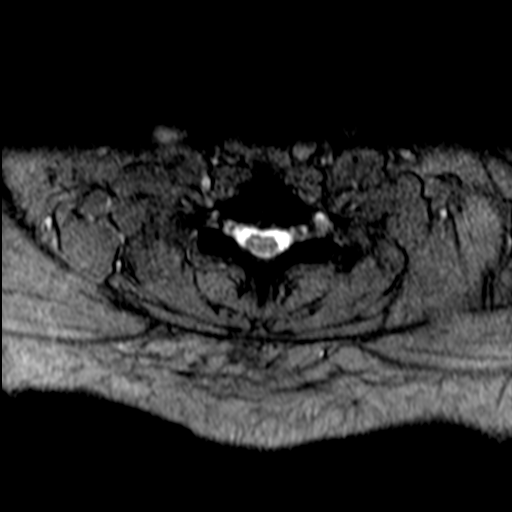
[im 9/25]
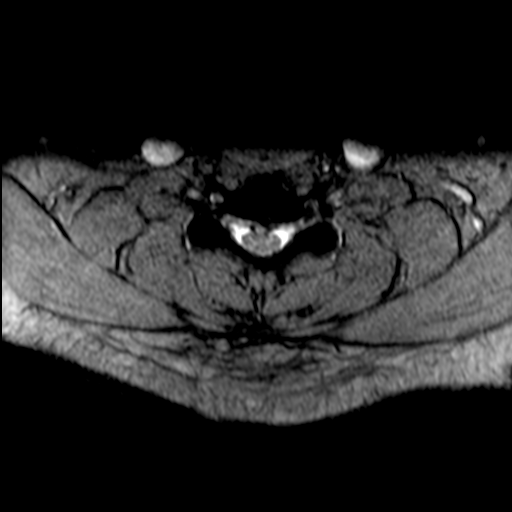
[im 11/25]
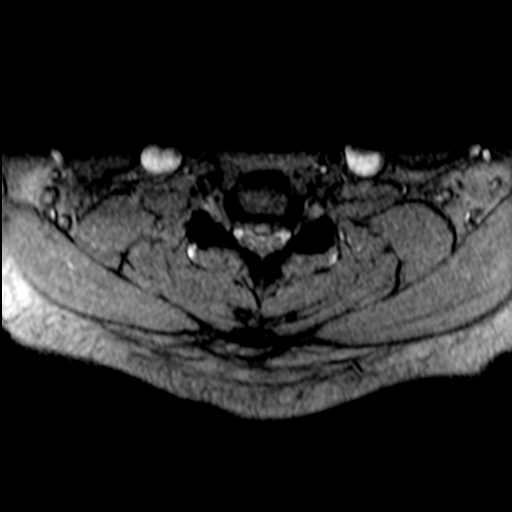
[im 15/25]
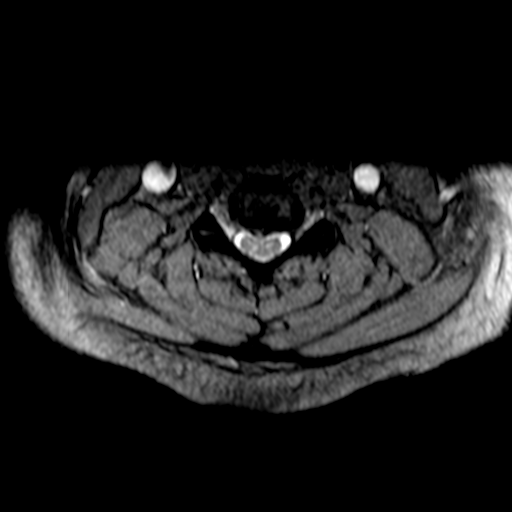
[im 17/25]
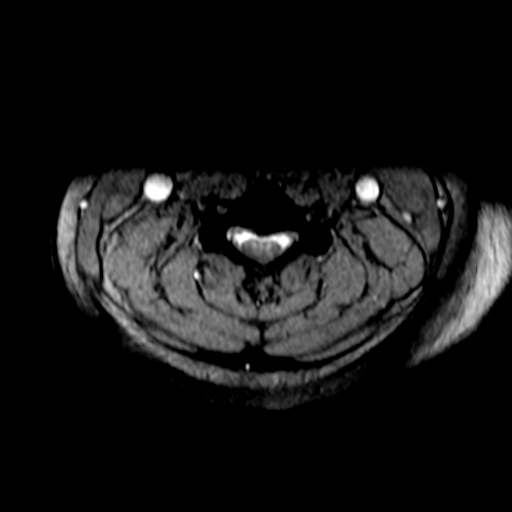
[im 21/25]
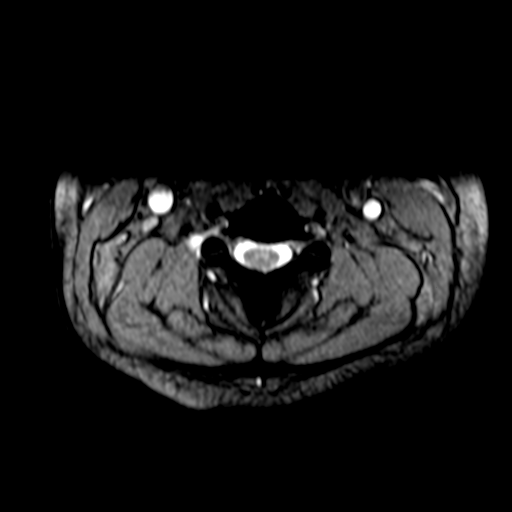
[im 25/25]
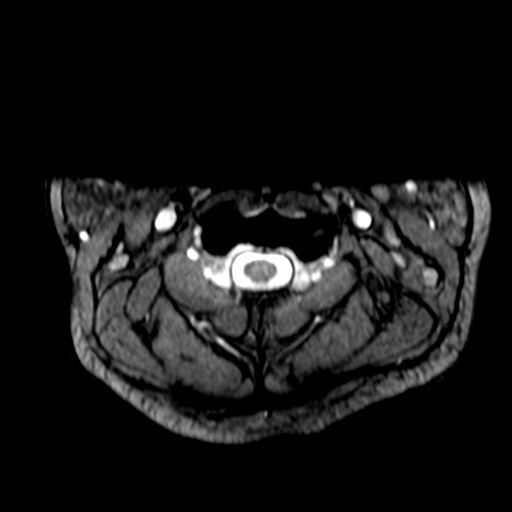

[40 of 48 positions shown; findings below may reference images not displayed]

FINDINGS: MRI HEAD FINDINGS- braces artifact perturbing field homogeneity,
obscuring frontal lobes on multiple sequences.

BRAIN: No reduced diffusion to suggest acute ischemia or hyperacute
demyelination. No susceptibility artifact to suggest hemorrhage. The
ventricles and sulci are normal for patient's age. No suspicious
parenchymal signal, mass or mass effect. No abnormal extra-axial
fluid collections.

VASCULAR: Normal major intracranial vascular flow voids present at
skull base.

SKULL AND UPPER CERVICAL SPINE: Partially empty sella. No suspicious
calvarial bone marrow signal. Craniocervical junction maintained.

SINUSES/ORBITS: Mild ethmoid mucosal thickening. Mastoid air cells
are well aerated. The included ocular globes and orbital contents
are non-suspicious.

OTHER: None.

MRI CERVICAL SPINE FINDINGS

ALIGNMENT: Straightened cervical lordosis.  No malalignment.

VERTEBRAE/DISCS: Vertebral bodies are intact. Intervertebral disc
morphology's and signal are normal. Mild C5-6 and C6-7 ventral
endplate spurring.

CORD:Cervical spinal cord is normal morphology and signal
characteristics from the cervicomedullary junction to level of T1-2,
the most caudal well visualized level.

POSTERIOR FOSSA, VERTEBRAL ARTERIES, PARASPINAL TISSUES: No MR
findings of ligamentous injury. Vertebral artery flow voids present.
Included posterior fossa and paraspinal soft tissues are normal.

DISC LEVELS:

C2-3: No disc bulge, canal stenosis nor neural foraminal narrowing.

C3-4: Mild uncovertebral hypertrophy without canal stenosis. Mild
LEFT neural foraminal narrowing.

C4-5: 4 mm RIGHT central disc protrusion with bright STIR signal
suggesting acute process. RIGHT ventral cord deformity. Mild to
moderate canal stenosis. Moderate LEFT neural foraminal narrowing.

C5-6: 4 mm RIGHT central disc protrusion bright STIR signal
suggesting acute process. RIGHT ventral cord deformity. Moderate
canal stenosis. Mild neural foraminal narrowing.

C6-7: Small central disc protrusion, uncovertebral hypertrophy. Mild
canal stenosis without neural foraminal narrowing.

C7-T1: No disc bulge, canal stenosis nor neural foraminal narrowing.
IMPRESSION: MRI head:

1. Limited by braces artifact.  No acute intracranial process.
2. Partially empty sella, otherwise negative MRI of the head without
contrast.
MRI cervical spine:

1. 4 mm RIGHT central C4-5 and C5-6 disc protrusions compressing the
spinal cord without cord edema. Small disc protrusions C6-7.
2. Moderate canal stenosis C5-6, mild to moderate C4-5 and mild at
C6-7.
3. Neural foraminal narrowing C3-4 thru C5-6: Moderate on the LEFT
at C4-5.

## 2018-09-03 IMAGING — MR MR HEAD W/O CM
8 of 10 series · 34 of 48 positions shown · non-contrast
Comparison: CT HEAD and cervical spine June 04, 2017 at 3366
hours

CLINICAL DATA: RIGHT arm and RIGHT leg numbness for 3 weeks. RIGHT
carpal tunnel.

EXAM:
MRI HEAD WITHOUT CONTRAST
MRI CERVICAL SPINE WITHOUT CONTRAST
TECHNIQUE: Multiplanar, multiecho pulse sequences of the brain and surrounding
structures, and cervical spine, to include the craniocervical
junction and cervicothoracic junction, were obtained without
intravenous contrast.

[Series 2: GRE · sagittal · 5.0mm · 0.45mm/px · 3 of 21 slices shown (1 of 2)]
[im 1/21]
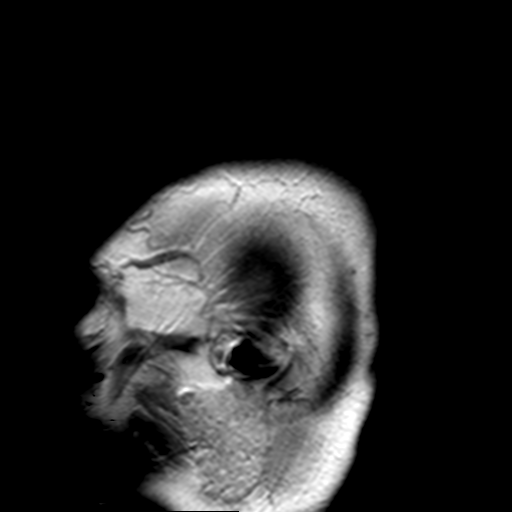
[im 11/21]
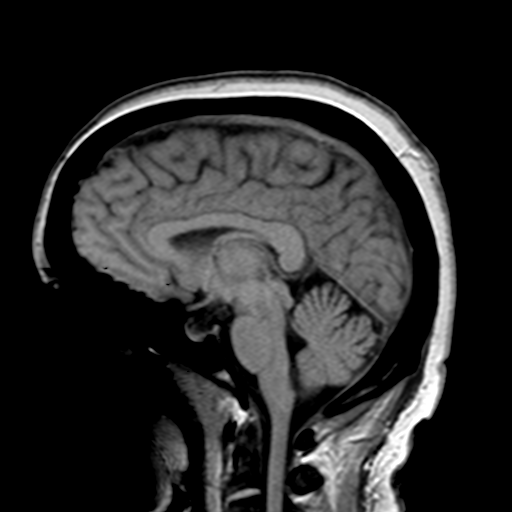
[im 21/21]
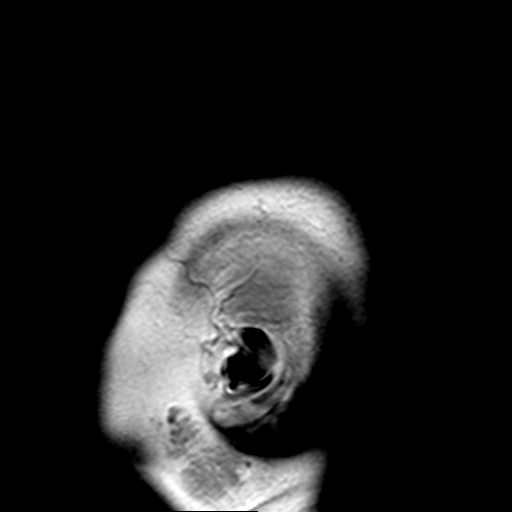

[Series 8: DWI · axial · 3.0mm · 1.80mm/px · z∈[-130,+16]mm · 7 of 50 slices shown (1 of 2)]
[im 1/50]
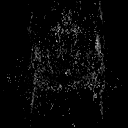
[im 9/50]
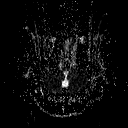
[im 17/50]
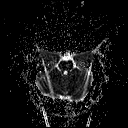
[im 25/50]
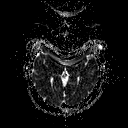
[im 33/50]
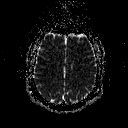
[im 41/50]
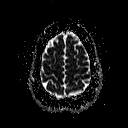
[im 50/50]
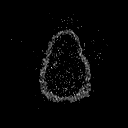

[Series 10: DWI · coronal · 3.0mm · 1.80mm/px · 6 of 43 slices shown (2 of 2)]
[im 1/43]
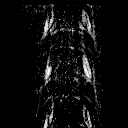
[im 9/43]
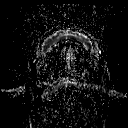
[im 17/43]
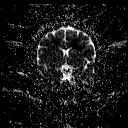
[im 26/43]
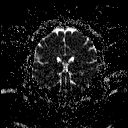
[im 34/43]
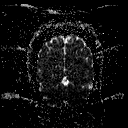
[im 43/43]
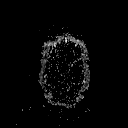

[Series 11: T2 · axial · 5.0mm · 0.45mm/px · z∈[-134,+15]mm · 3 of 24 slices shown (1 of 3)]
[im 1/24]
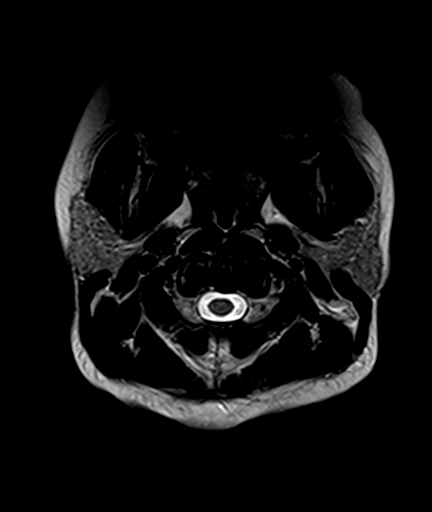
[im 12/24]
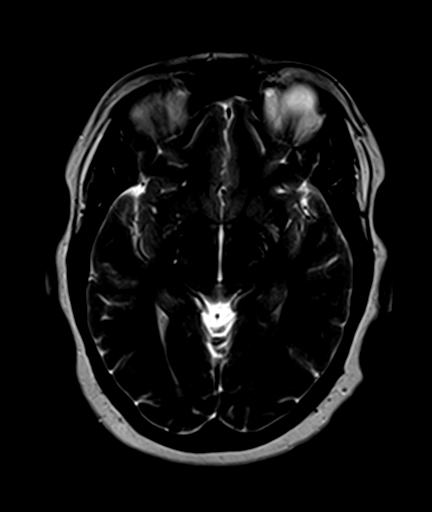
[im 24/24]
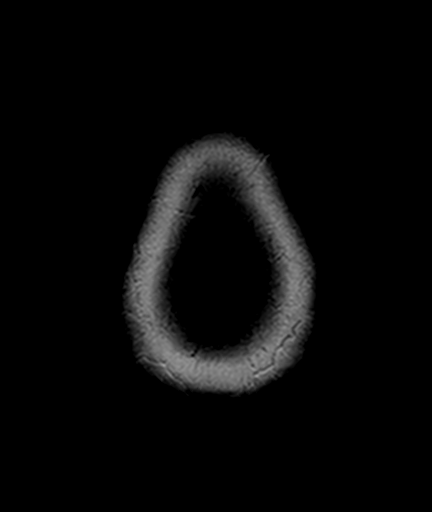

[Series 12: FLAIR · axial · 3.0mm · 0.45mm/px · z∈[-137,+18]mm · 7 of 53 slices shown]
[im 1/53]
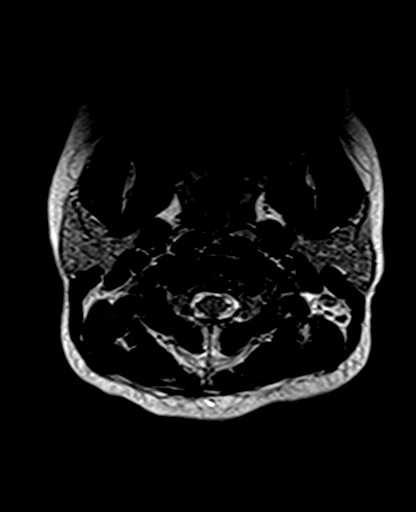
[im 9/53]
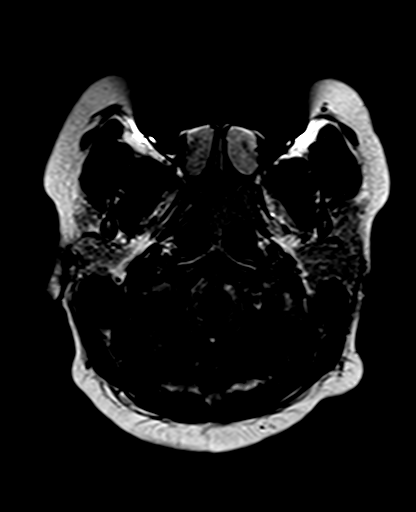
[im 18/53]
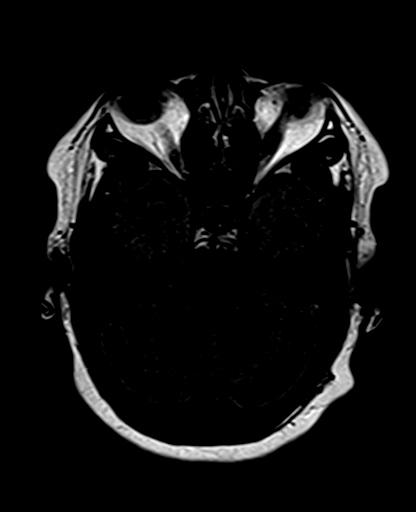
[im 27/53]
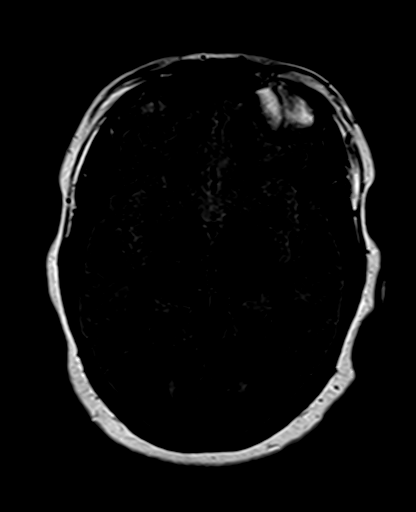
[im 35/53]
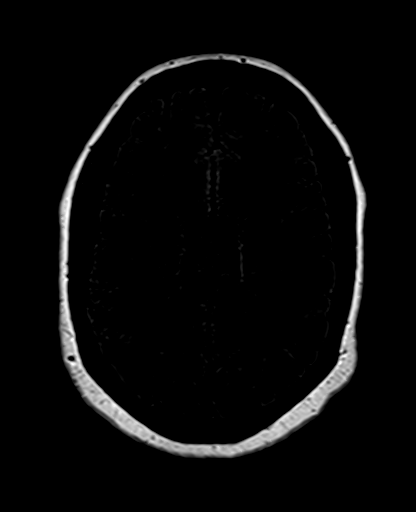
[im 44/53]
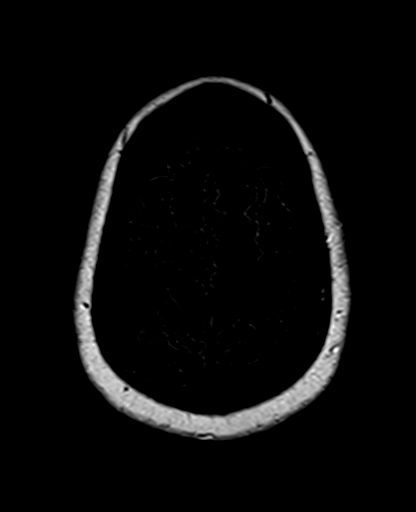
[im 53/53]
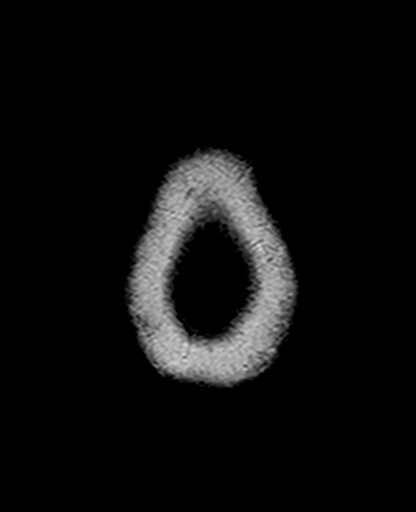

[Series 13: T2 · axial · 5.0mm · 1.20mm/px · z∈[-134,+15]mm · 3 of 24 slices shown (2 of 3)]
[im 1/24]
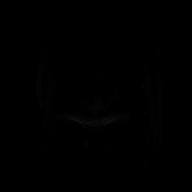
[im 12/24]
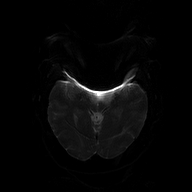
[im 24/24]
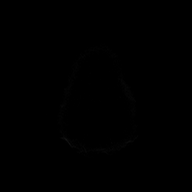

[Series 14: GRE · axial · 5.0mm · 0.45mm/px · 1 of 25 slices shown (2 of 2)]
[im 1/25]
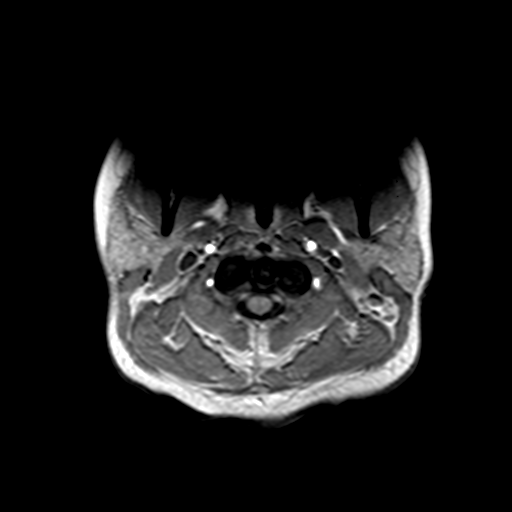

[Series 15: T2 · coronal · 5.0mm · 0.45mm/px · 4 of 26 slices shown (3 of 3)]
[im 1/26]
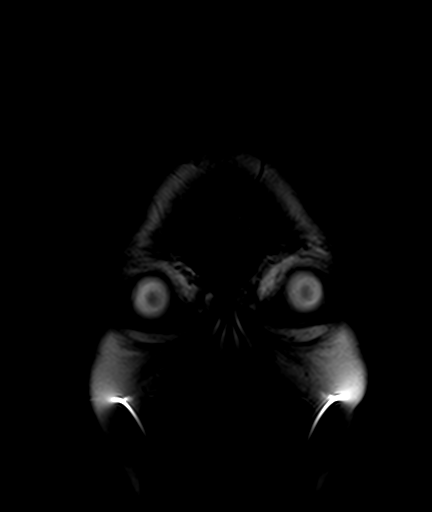
[im 9/26]
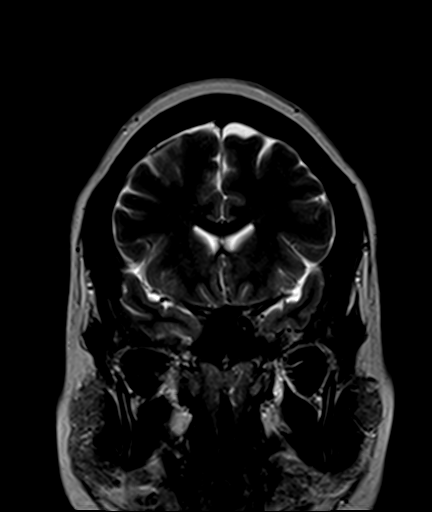
[im 17/26]
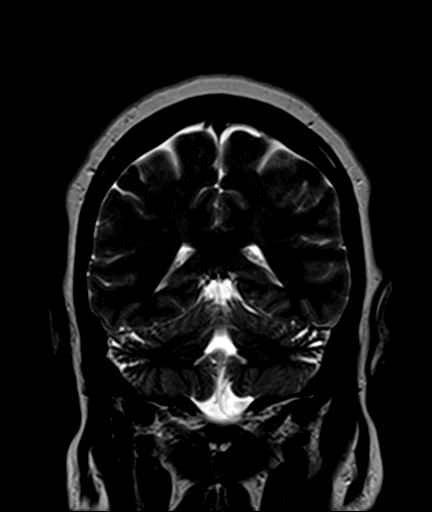
[im 26/26]
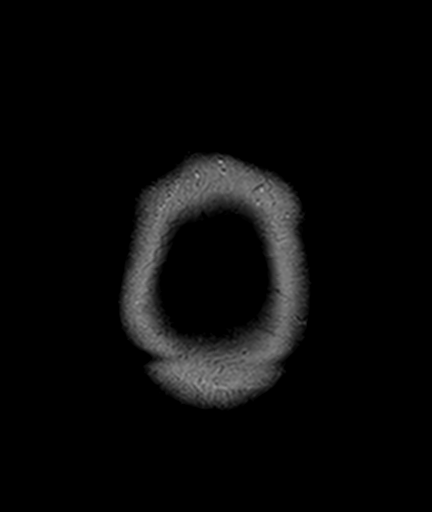

[34 of 48 positions shown; findings below may reference images not displayed]

FINDINGS: MRI HEAD FINDINGS- braces artifact perturbing field homogeneity,
obscuring frontal lobes on multiple sequences.

BRAIN: No reduced diffusion to suggest acute ischemia or hyperacute
demyelination. No susceptibility artifact to suggest hemorrhage. The
ventricles and sulci are normal for patient's age. No suspicious
parenchymal signal, mass or mass effect. No abnormal extra-axial
fluid collections.

VASCULAR: Normal major intracranial vascular flow voids present at
skull base.

SKULL AND UPPER CERVICAL SPINE: Partially empty sella. No suspicious
calvarial bone marrow signal. Craniocervical junction maintained.

SINUSES/ORBITS: Mild ethmoid mucosal thickening. Mastoid air cells
are well aerated. The included ocular globes and orbital contents
are non-suspicious.

OTHER: None.

MRI CERVICAL SPINE FINDINGS

ALIGNMENT: Straightened cervical lordosis.  No malalignment.

VERTEBRAE/DISCS: Vertebral bodies are intact. Intervertebral disc
morphology's and signal are normal. Mild C5-6 and C6-7 ventral
endplate spurring.

CORD:Cervical spinal cord is normal morphology and signal
characteristics from the cervicomedullary junction to level of T1-2,
the most caudal well visualized level.

POSTERIOR FOSSA, VERTEBRAL ARTERIES, PARASPINAL TISSUES: No MR
findings of ligamentous injury. Vertebral artery flow voids present.
Included posterior fossa and paraspinal soft tissues are normal.

DISC LEVELS:

C2-3: No disc bulge, canal stenosis nor neural foraminal narrowing.

C3-4: Mild uncovertebral hypertrophy without canal stenosis. Mild
LEFT neural foraminal narrowing.

C4-5: 4 mm RIGHT central disc protrusion with bright STIR signal
suggesting acute process. RIGHT ventral cord deformity. Mild to
moderate canal stenosis. Moderate LEFT neural foraminal narrowing.

C5-6: 4 mm RIGHT central disc protrusion bright STIR signal
suggesting acute process. RIGHT ventral cord deformity. Moderate
canal stenosis. Mild neural foraminal narrowing.

C6-7: Small central disc protrusion, uncovertebral hypertrophy. Mild
canal stenosis without neural foraminal narrowing.

C7-T1: No disc bulge, canal stenosis nor neural foraminal narrowing.
IMPRESSION: MRI head:

1. Limited by braces artifact.  No acute intracranial process.
2. Partially empty sella, otherwise negative MRI of the head without
contrast.
MRI cervical spine:

1. 4 mm RIGHT central C4-5 and C5-6 disc protrusions compressing the
spinal cord without cord edema. Small disc protrusions C6-7.
2. Moderate canal stenosis C5-6, mild to moderate C4-5 and mild at
C6-7.
3. Neural foraminal narrowing C3-4 thru C5-6: Moderate on the LEFT
at C4-5.

## 2018-09-06 ENCOUNTER — Encounter (INDEPENDENT_AMBULATORY_CARE_PROVIDER_SITE_OTHER): Payer: Self-pay

## 2018-09-06 ENCOUNTER — Ambulatory Visit: Payer: BC Managed Care – PPO | Admitting: Family Medicine

## 2018-09-06 ENCOUNTER — Other Ambulatory Visit (HOSPITAL_COMMUNITY)
Admission: RE | Admit: 2018-09-06 | Discharge: 2018-09-06 | Disposition: A | Discharge status: Home / Self Care | Payer: BC Managed Care – PPO | Source: Ambulatory Visit | Attending: Family Medicine | Admitting: Family Medicine

## 2018-09-06 ENCOUNTER — Encounter: Payer: Self-pay | Admitting: Family Medicine

## 2018-09-06 VITALS — BP 142/88 | HR 61 | Temp 98.6°F | Ht 63.0 in | Wt 227.0 lb

## 2018-09-06 DIAGNOSIS — N898 Other specified noninflammatory disorders of vagina: Secondary | ICD-10-CM

## 2018-09-06 DIAGNOSIS — Z113 Encounter for screening for infections with a predominantly sexual mode of transmission: Secondary | ICD-10-CM

## 2018-09-06 DIAGNOSIS — N760 Acute vaginitis: Secondary | ICD-10-CM | POA: Diagnosis not present

## 2018-09-06 DIAGNOSIS — B9689 Other specified bacterial agents as the cause of diseases classified elsewhere: Secondary | ICD-10-CM

## 2018-09-06 DIAGNOSIS — I10 Essential (primary) hypertension: Secondary | ICD-10-CM | POA: Diagnosis not present

## 2018-09-06 LAB — POCT URINALYSIS DIPSTICK
Bilirubin, UA: NEGATIVE
Blood, UA: NEGATIVE
Glucose, UA: NEGATIVE
Ketones, UA: NEGATIVE
Leukocytes, UA: NEGATIVE
Nitrite, UA: NEGATIVE
Protein, UA: NEGATIVE
Spec Grav, UA: >=1.03 — AB (ref 1.010–1.025)
Urobilinogen, UA: 0.2 E.U./dL
pH, UA: 5.5 (ref 5.0–8.0)

## 2018-09-06 LAB — POCT URINE PREGNANCY: Preg Test, Ur: NEGATIVE

## 2018-09-06 MED ORDER — LOSARTAN POTASSIUM 100 MG PO TABS: 100.0000 mg | ORAL_TABLET | Freq: Every day | ORAL | 3 refills | Status: DC

## 2018-09-06 MED ORDER — METRONIDAZOLE 500 MG PO TABS: 500.0000 mg | ORAL_TABLET | Freq: Two times a day (BID) | ORAL | 0 refills | Status: DC

## 2018-09-06 NOTE — Progress Notes (Signed)
Subjective:    Patient ID: Anita Gibbs, female    DOB: 1971-05-14, 48 y.o.   MRN: [id number]  HPI  Presents to clinic for follow-up on blood pressure after medication was changed from lisinopril-hydrochlorothiazide to losartan and also complains of some vaginal discharge with foul smell.  Patient states that change on blood pressure medicine so far has been going okay.  Has no adverse side effects to new medication, denies chest pain, short of breath, leg swelling.  States that she is checked her BP at home is still is running on the higher end in the 160s to 170s over 80s to 90s.  Patient states the vaginal discharge began about a week ago.  She recently began having sexual intercourse again, and is interested in getting STD testing done today.  Denies any painful urination, denies any abdominal pain, denies nausea/vomiting/diarrhea.  Patient Active Problem List   Diagnosis Date Noted  . Essential hypertension 05/31/2018  . Gout of left foot 05/31/2018  . Numbness of left foot 05/31/2018  . Hyperkalemia 05/31/2018  . Cervical myelopathy ([place]) 07/01/2017   Social History   Tobacco Use  . Smoking status: Never Smoker  . Smokeless tobacco: Never Used  Substance Use Topics  . Alcohol use: Yes    Comment: occ   Review of Systems   Constitutional: Negative for chills, fatigue and fever.  HENT: Negative for congestion, ear pain, sinus pain and sore throat.   Eyes: Negative.   Respiratory: Negative for cough, shortness of breath and wheezing.   Cardiovascular: Negative for chest pain, palpitations and leg swelling.  Gastrointestinal: Negative for abdominal pain, diarrhea, nausea and vomiting.  Genitourinary: Negative for dysuria, frequency and urgency. +vaginal discharge. Musculoskeletal: Negative for arthralgias and myalgias.  Skin: Negative for color change, pallor and rash.  Neurological: Negative for syncope, light-headedness and headaches.  Psychiatric/Behavioral: The  patient is not nervous/anxious.       Objective:   Physical Exam Vitals signs and nursing note reviewed.  HENT:     Head: Normocephalic and atraumatic.  Eyes:     Extraocular Movements: Extraocular movements intact.     Conjunctiva/sclera: Conjunctivae normal.  Neck:     Musculoskeletal: Neck supple. No neck rigidity.  Cardiovascular:     Rate and Rhythm: Normal rate and regular rhythm.  Pulmonary:     Effort: Pulmonary effort is normal. No respiratory distress.     Breath sounds: Normal breath sounds. No wheezing, rhonchi or rales.  Genitourinary:    Labia:        Right: No rash, tenderness or lesion.        Left: No rash, tenderness or lesion.      Vagina: Vaginal discharge (small amount of white vaginal discharge) present.     Cervix: Normal.     Uterus: Not tender.      Adnexa:        Right: No tenderness.         Left: No tenderness.    Lymphadenopathy:     Cervical: No cervical adenopathy.     Lower Body: No right inguinal adenopathy. No left inguinal adenopathy.  Skin:    General: Skin is warm and dry.     Coloration: Skin is not pale.  Neurological:     Mental Status: She is alert and oriented to person, place, and time.  Psychiatric:        Mood and Affect: Mood normal.        Behavior: Behavior normal.  Vitals:   09/06/18 0853  BP: (!) 142/88  Pulse: 61  Temp: 98.6 F (37 C)  SpO2: 97%   Assessment & Plan:   Discharge from vagina, STD screen, bacterial vaginosis-due to vaginal discharge and foul smell, I do suspect bacterial vaginosis.  Patient will take Flagyl twice daily for 7 days.  STD testing sample collected during pelvic exam and sent to lab to look for gonorrhea, chlamydia, trichomonas, bacterial vaginosis, vaginal Candida.  Patient also will have lab work to screen for syphilis and HIV.  Essential hypertension-patient has been tolerating losartan without any adverse effects.  BP still has been running high at home, we will increase  losartan to 100 mg once daily.  Return to clinic in 2 weeks for BP check and basic metabolic panel recheck after dose increase of losartan.

## 2018-09-07 ENCOUNTER — Encounter: Payer: Self-pay | Admitting: Nurse Practitioner

## 2018-09-07 ENCOUNTER — Ambulatory Visit
Admission: RE | Admit: 2018-09-07 | Discharge: 2018-09-07 | Disposition: A | Discharge status: Home / Self Care | Payer: BC Managed Care – PPO | Source: Ambulatory Visit | Attending: Nurse Practitioner | Admitting: Nurse Practitioner

## 2018-09-07 ENCOUNTER — Other Ambulatory Visit: Payer: Self-pay

## 2018-09-07 ENCOUNTER — Ambulatory Visit: Payer: BC Managed Care – PPO | Admitting: Nurse Practitioner

## 2018-09-07 VITALS — BP 155/93 | HR 60 | Temp 98.3°F | Ht 63.0 in | Wt 226.0 lb

## 2018-09-07 DIAGNOSIS — M5442 Lumbago with sciatica, left side: Secondary | ICD-10-CM

## 2018-09-07 DIAGNOSIS — M79602 Pain in left arm: Secondary | ICD-10-CM

## 2018-09-07 DIAGNOSIS — G8929 Other chronic pain: Secondary | ICD-10-CM | POA: Insufficient documentation

## 2018-09-07 DIAGNOSIS — M542 Cervicalgia: Secondary | ICD-10-CM

## 2018-09-07 DIAGNOSIS — G894 Chronic pain syndrome: Secondary | ICD-10-CM | POA: Insufficient documentation

## 2018-09-07 DIAGNOSIS — M79601 Pain in right arm: Secondary | ICD-10-CM

## 2018-09-07 DIAGNOSIS — M5441 Lumbago with sciatica, right side: Secondary | ICD-10-CM

## 2018-09-07 DIAGNOSIS — Z79899 Other long term (current) drug therapy: Secondary | ICD-10-CM | POA: Insufficient documentation

## 2018-09-07 DIAGNOSIS — M899 Disorder of bone, unspecified: Secondary | ICD-10-CM

## 2018-09-07 DIAGNOSIS — M533 Sacrococcygeal disorders, not elsewhere classified: Principal | ICD-10-CM

## 2018-09-07 DIAGNOSIS — M79605 Pain in left leg: Secondary | ICD-10-CM

## 2018-09-07 DIAGNOSIS — M79604 Pain in right leg: Secondary | ICD-10-CM

## 2018-09-07 DIAGNOSIS — Z789 Other specified health status: Secondary | ICD-10-CM | POA: Insufficient documentation

## 2018-09-07 HISTORY — DX: Other chronic pain: G89.29

## 2018-09-07 HISTORY — DX: Disorder of bone, unspecified: M89.9

## 2018-09-07 HISTORY — DX: Other long term (current) drug therapy: Z79.899

## 2018-09-07 HISTORY — DX: Other specified health status: Z78.9

## 2018-09-07 LAB — CERVICOVAGINAL ANCILLARY ONLY
Bacterial vaginitis: POSITIVE — AB
Candida vaginitis: NEGATIVE
Chlamydia: NEGATIVE
Neisseria Gonorrhea: NEGATIVE
Trichomonas: POSITIVE — AB

## 2018-09-07 LAB — RPR TITER: RPR Titer: 1:1 {titer} — ABNORMAL HIGH

## 2018-09-07 LAB — URINE CULTURE
MICRO NUMBER:: 15533
SPECIMEN QUALITY:: ADEQUATE

## 2018-09-07 LAB — RPR: RPR Ser Ql: REACTIVE — AB

## 2018-09-07 LAB — FLUORESCENT TREPONEMAL AB(FTA)-IGG-BLD: Fluorescent Treponemal ABS: REACTIVE — AB

## 2018-09-07 LAB — HIV ANTIBODY (ROUTINE TESTING W REFLEX): HIV 1&2 Ab, 4th Generation: NONREACTIVE

## 2018-09-07 NOTE — Patient Instructions (Addendum)
____________________________________________________________________________________________  Appointment Policy Summary  It is our goal and responsibility to provide the medical community with assistance in the evaluation and management of patients with chronic pain. Unfortunately our resources are limited. Because we do not have an unlimited amount of time, or available appointments, we are required to closely monitor and manage their use. The following rules exist to maximize their use:  Patient's responsibilities: 1. Punctuality:  At what time should I arrive? You should be physically present in our office 30 minutes before your scheduled appointment. Your scheduled appointment is with your assigned healthcare provider. However, it takes 5-10 minutes to be "checked-in", and another 15 minutes for the nurses to do the admission. If you arrive to our office at the time you were given for your appointment, you will end up being at least 20-25 minutes late to your appointment with the provider. 2. Tardiness:  What happens if I arrive only a few minutes after my scheduled appointment time? You will need to reschedule your appointment. The cutoff is your appointment time. This is why it is so important that you arrive at least 30 minutes before that appointment. If you have an appointment scheduled for 10:00 AM and you arrive at 10:01, you will be required to reschedule your appointment.  3. Plan ahead:  Always assume that you will encounter traffic on your way in. Plan for it. If you are dependent on a driver, make sure they understand these rules and the need to arrive early. 4. Other appointments and responsibilities:  Avoid scheduling any other appointments before or after your pain clinic appointments.  5. Be prepared:  Write down everything that you need to discuss with your healthcare provider and give this information to the admitting nurse. Write down the medications that you will need  refilled. Bring your pills and bottles (even the empty ones), to all of your appointments, except for those where a procedure is scheduled. 6. No children or pets:  Find someone to take care of them. It is not appropriate to bring them in. 7. Scheduling changes:  We request "advanced notification" of any changes or cancellations. 8. Advanced notification:  Defined as a time period of more than 24 hours prior to the originally scheduled appointment. This allows for the appointment to be offered to other patients. 9. Rescheduling:  When a visit is rescheduled, it will require the cancellation of the original appointment. For this reason they both fall within the category of "Cancellations".  10. Cancellations:  They require advanced notification. Any cancellation less than 24 hours before the  appointment will be recorded as a "No Show". 11. No Show:  Defined as an unkept appointment where the patient failed to notify or declare to the practice their intention or inability to keep the appointment.  Corrective process for repeat offenders:  1. Tardiness: Three (3) episodes of rescheduling due to late arrivals will be recorded as one (1) "No Show". 2. Cancellation or reschedule: Three (3) cancellations or rescheduling will be recorded as one (1) "No Show". 3. "No Shows": Three (3) "No Shows" within a 12 month period will result in discharge from the practice. ____________________________________________________________________________________________   ______________________________________________________________________________________________  Specialty Pain Scale  Introduction:  There are significant differences in how pain is reported. The word pain usually refers to physical pain, but it is also a common synonym of suffering. The medical community uses a scale from 0 (zero) to 10 (ten) to report pain level. Zero (0) is described as "no pain",   while ten (10) is described as "the worse pain  you can imagine". The problem with this scale is that physical pain is reported along with suffering. Suffering refers to mental pain, or more often yet it refers to any unpleasant feeling, emotion or aversion associated with the perception of harm or threat of harm. It is the psychological component of pain.  Pain Specialists prefer to separate the two components. The pain scale used by this practice is the Verbal Numerical Rating Scale (VNRS-11). This scale is for the physical pain only. DO NOT INCLUDE how your pain psychologically affects you. This scale is for adults 21 years of age and older. It has 11 (eleven) levels. The 1st level is 0/10. This means: "right now, I have no pain". In the context of pain management, it also means: "right now, my physical pain is under control with the current therapy".  General Information:  The scale should reflect your current level of pain. Unless you are specifically asked for the level of your worst pain, or your average pain. If you are asked for one of these two, then it should be understood that it is over the past 24 hours.  Levels 1 (one) through 5 (five) are described below, and can be treated as an outpatient. Ambulatory pain management facilities such as ours are more than adequate to treat these levels. Levels 6 (six) through 10 (ten) are also described below, however, these must be treated as a hospitalized patient. While levels 6 (six) and 7 (seven) may be evaluated at an urgent care facility, levels 8 (eight) through 10 (ten) constitute medical emergencies and as such, they belong in a hospital's emergency department. When having these levels (as described below), do not come to our office. Our facility is not equipped to manage these levels. Go directly to an urgent care facility or an emergency department to be evaluated.  Definitions:  Activities of Daily Living (ADL): Activities of daily living (ADL or ADLs) is a term used in healthcare to refer to  people's daily self-care activities. Health professionals often use a person's ability or inability to perform ADLs as a measurement of their functional status, particularly in regard to people post injury, with disabilities and the elderly. There are two ADL levels: Basic and Instrumental. Basic Activities of Daily Living (BADL  or BADLs) consist of self-care tasks that include: Bathing and showering; personal hygiene and grooming (including brushing/combing/styling hair); dressing; Toilet hygiene (getting to the toilet, cleaning oneself, and getting back up); eating and self-feeding (not including cooking or chewing and swallowing); functional mobility, often referred to as "transferring", as measured by the ability to walk, get in and out of bed, and get into and out of a chair; the broader definition (moving from one place to another while performing activities) is useful for people with different physical abilities who are still able to get around independently. Basic ADLs include the things many people do when they get up in the morning and get ready to go out of the house: get out of bed, go to the toilet, bathe, dress, groom, and eat. On the average, loss of function typically follows a particular order. Hygiene is the first to go, followed by loss of toilet use and locomotion. The last to go is the ability to eat. When there is only one remaining area in which the person is independent, there is a 62.9% chance that it is eating and only a 3.5% chance that it is hygiene. Instrumental Activities   of Daily Living (IADL or IADLs) are not necessary for fundamental functioning, but they let an individual live independently in a community. IADL consist of tasks that include: cleaning and maintaining the house; home establishment and maintenance; care of others (including selecting and supervising caregivers); care of pets; child rearing; managing money; managing financials (investments, etc.); meal preparation  and cleanup; shopping for groceries and necessities; moving within the community; safety procedures and emergency responses; health management and maintenance (taking prescribed medications); and using the telephone or other form of communication.  Instructions:  Most patients tend to report their pain as a combination of two factors, their physical pain and their psychosocial pain. This last one is also known as "suffering" and it is reflection of how physical pain affects you socially and psychologically. From now on, report them separately.  From this point on, when asked to report your pain level, report only your physical pain. Use the following table for reference.  Pain Clinic Pain Levels (0-5/10)  Pain Level Score  Description  No Pain 0   Mild pain 1 Nagging, annoying, but does not interfere with basic activities of daily living (ADL). Patients are able to eat, bathe, get dressed, toileting (being able to get on and off the toilet and perform personal hygiene functions), transfer (move in and out of bed or a chair without assistance), and maintain continence (able to control bladder and bowel functions). Blood pressure and heart rate are unaffected. A normal heart rate for a healthy adult ranges from 60 to 100 bpm (beats per minute).   Mild to moderate pain 2 Noticeable and distracting. Impossible to hide from other people. More frequent flare-ups. Still possible to adapt and function close to normal. It can be very annoying and may have occasional stronger flare-ups. With discipline, patients may get used to it and adapt.   Moderate pain 3 Interferes significantly with activities of daily living (ADL). It becomes difficult to feed, bathe, get dressed, get on and off the toilet or to perform personal hygiene functions. Difficult to get in and out of bed or a chair without assistance. Very distracting. With effort, it can be ignored when deeply involved in activities.   Moderately severe pain  4 Impossible to ignore for more than a few minutes. With effort, patients may still be able to manage work or participate in some social activities. Very difficult to concentrate. Signs of autonomic nervous system discharge are evident: dilated pupils (mydriasis); mild sweating (diaphoresis); sleep interference. Heart rate becomes elevated (>115 bpm). Diastolic blood pressure (lower number) rises above 100 mmHg. Patients find relief in laying down and not moving.   Severe pain 5 Intense and extremely unpleasant. Associated with frowning face and frequent crying. Pain overwhelms the senses.  Ability to do any activity or maintain social relationships becomes significantly limited. Conversation becomes difficult. Pacing back and forth is common, as getting into a comfortable position is nearly impossible. Pain wakes you up from deep sleep. Physical signs will be obvious: pupillary dilation; increased sweating; goosebumps; brisk reflexes; cold, clammy hands and feet; nausea, vomiting or dry heaves; loss of appetite; significant sleep disturbance with inability to fall asleep or to remain asleep. When persistent, significant weight loss is observed due to the complete loss of appetite and sleep deprivation.  Blood pressure and heart rate becomes significantly elevated. Caution: If elevated blood pressure triggers a pounding headache associated with blurred vision, then the patient should immediately seek attention at an urgent or emergency care unit, as   these may be signs of an impending stroke.    Emergency Department Pain Levels (6-10/10)  Emergency Room Pain 6 Severely limiting. Requires emergency care and should not be seen or managed at an outpatient pain management facility. Communication becomes difficult and requires great effort. Assistance to reach the emergency department may be required. Facial flushing and profuse sweating along with potentially dangerous increases in heart rate and blood pressure  will be evident.   Distressing pain 7 Self-care is very difficult. Assistance is required to transport, or use restroom. Assistance to reach the emergency department will be required. Tasks requiring coordination, such as bathing and getting dressed become very difficult.   Disabling pain 8 Self-care is no longer possible. At this level, pain is disabling. The individual is unable to do even the most "basic" activities such as walking, eating, bathing, dressing, transferring to a bed, or toileting. Fine motor skills are lost. It is difficult to think clearly.   Incapacitating pain 9 Pain becomes incapacitating. Thought processing is no longer possible. Difficult to remember your own name. Control of movement and coordination are lost.   The worst pain imaginable 10 At this level, most patients pass out from pain. When this level is reached, collapse of the autonomic nervous system occurs, leading to a sudden drop in blood pressure and heart rate. This in turn results in a temporary and dramatic drop in blood flow to the brain, leading to a loss of consciousness. Fainting is one of the body's self defense mechanisms. Passing out puts the brain in a calmed state and causes it to shut down for a while, in order to begin the healing process.    Summary: 1. Refer to this scale when providing [place] with your pain level. 2. Be accurate and careful when reporting your pain level. This will help with your care. 3. Over-reporting your pain level will lead to loss of credibility. 4. Even a level of 1/10 means that there is pain and will be treated at our facility. 5. High, inaccurate reporting will be documented as "Symptom Exaggeration", leading to loss of credibility and suspicions of possible secondary gains such as obtaining more narcotics, or wanting to appear disabled, for fraudulent reasons. 6. Only pain levels of 5 or below will be seen at our facility. 7. Pain levels of 6 and above will be sent to the  Emergency Department and the appointment cancelled. ______________________________________________________________________________________________   BMI Assessment: Estimated body mass index is 40.03 kg/m as calculated from the following:   Height as of this encounter: 5\' 3"  (1.6 m).   Weight as of this encounter: 226 lb (102.5 kg).  BMI interpretation table: BMI level Category Range association with higher incidence of chronic pain  <18 kg/m2 Underweight   18.5-24.9 kg/m2 Ideal body weight   25-29.9 kg/m2 Overweight Increased incidence by 20%  30-34.9 kg/m2 Obese (Class I) Increased incidence by 68%  35-39.9 kg/m2 Severe obesity (Class II) Increased incidence by 136%  >40 kg/m2 Extreme obesity (Class III) Increased incidence by 254%      BMI Readings from Last 4 Encounters:  09/07/18 40.03 kg/m  09/06/18 40.21 kg/m  08/17/18 39.68 kg/m  08/03/18 41.34 kg/m      Wt Readings from Last 4 Encounters:  09/07/18 226 lb (102.5 kg)  09/06/18 227 lb (103 kg)  08/17/18 224 lb (101.6 kg)  08/03/18 226 lb (102.5 kg)

## 2018-09-07 NOTE — Progress Notes (Signed)
Patient's Name: Anita Gibbs  MRN: [id number]  Referring Provider: Marin Olp, PA-C  DOB: 1971/06/24  PCP: Jodelle Green, FNP  DOS: 09/07/2018  Note by: Dionisio David NP  Service setting: Ambulatory outpatient  Specialty: Interventional Pain Management  Location: [facility] (AMB) Pain Management Facility    Patient type: New Patient    Primary Reason(s) for Visit: Initial Patient Evaluation CC: Neck Pain  HPI  Ms. Mortell is a 48 y.o. year old, female patient, who comes today for an initial evaluation. She has Cervical myelopathy (Delia); Essential hypertension; Gout of left foot; Numbness and tingling of both feet; Hyperkalemia; Chronic bilateral low back pain without sciatica; Chronic neck pain (Primary Area of Pain) (L>R); Chronic upper extremity pain (Secondary Area of Pain) (R>L); Chronic bilateral low back pain with bilateral sciatica [facility] Area of Pain) (R>L); Chronic pain of both lower extremities (Fourth Area of Pain) (R>L); Chronic pain syndrome; Pharmacologic therapy; Disorder of skeletal system; Problems influencing health status; and Chronic right sacroiliac joint pain on their problem list.. Her primarily concern today is the Neck Pain  Pain Assessment: Location: Right Neck(right side all way to foot) Radiating: pain radiaties down neck to foot on the right side Onset: More than a month ago Duration: Chronic pain Quality: Shooting, Sharp, Aching, Constant Severity: 7 /10 (subjective, self-reported pain score)  Note: Reported level is compatible with observation. Clinically the patient looks like a 2/10 A 2/10 is viewed as "Mild to Moderate" and described as noticeable and distracting. Impossible to hide from other people. More frequent flare-ups. Still possible to adapt and function close to normal. It can be very annoying and may have occasional stronger flare-ups. With discipline, patients may get used to it and adapt. Information on the proper use of the pain scale provided to the  patient today. When using our objective Pain Scale, levels between 6 and 10/10 are said to belong in an emergency room, as it progressively worsens from a 6/10, described as severely limiting, requiring emergency care not usually available at an outpatient pain management facility. At a 6/10 level, communication becomes difficult and requires great effort. Assistance to reach the emergency department may be required. Facial flushing and profuse sweating along with potentially dangerous increases in heart rate and blood pressure will be evident. Effect on ADL: pain slow me down but i still have to work Timing: Constant Modifying factors: medications BP: (!) 155/93  HR: 60  Onset and Duration: Gradual and Date of onset: 2018 Cause of pain: Unknown Severity: Getting worse, NAS-11 at its worse: 12/10, NAS-11 at its best: 6/10, NAS-11 now: 8/10 and NAS-11 on the average: 7/10 Timing: Not influenced by the time of the day and During activity or exercise Aggravating Factors: Lifiting and Prolonged standing Alleviating Factors: Lying down, Medications, Resting and Warm showers or baths Associated Problems: Night-time cramps, Numbness, Sweating, Tingling, Pain that wakes patient up and Pain that does not allow patient to sleep Quality of Pain: Cramping, Nagging, Sharp, Throbbing, Tingling and Uncomfortable Previous Examinations or Tests: CT scan, MRI scan and X-rays Previous Treatments: The patient denies treatments  The patient comes into the clinics today for the first time for a chronic pain management evaluation.  According to the patient her primary area of pain is in her neck.  She denies any precipitating factors.  She admits that she woke up one morning left-sided numbness and tingling was seen in the ER and 1 month later she underwent the surgery.  She is status post  ACDF October 2018 by Dr. Cari Caraway.  She denies any previous interventional therapy.  She denies any physical therapy.  She has had  a recent CT scan along with cervical MRI.  She is currently describing her pain is sharp pain in the neck.  Indicated that she was sent to have an injection in her neck.  Her second area of pain is in her arms.  She admits that currently the right side is worse than the left.  She has numbness tingling that goes down into the fingers.  She has had a nerve conduction study.  She was diagnosed with a right-sided carpal tunnel syndrome.  Denies any weakness.  Her third area of pain is in her lower back.  She admits that the right side is greater than the left.  She denies any previous surgery, interventional therapy, physical therapy or recent images.  Her last area of pain is in her legs.  She admits that the right is greater than the left.  She has numbness and tingling in her right leg that goes down into the buttocks down the back of her leg into the top of her foot.  She does have occasional swelling in her right leg.  On the left side she does have some tingling.  She denies any weakness.  She denies any recent physical therapy.  Today I took the time to provide the patient with information regarding this pain practice. The patient was informed that the practice is divided into two sections: an interventional pain management section, as well as a completely separate and distinct medication management section. I explained that there are procedure days for interventional therapies, and evaluation days for follow-ups and medication management. Because of the amount of documentation required during both, they are kept separated. This means that there is the possibility that he may be scheduled for a procedure on one day, and medication management the next. I have also informed her that because of staffing and facility limitations, this practice will no longer take patients for medication management only. To illustrate the reasons for this, I gave the patient the example of surgeons, and how inappropriate it  would be to refer a patient to his/her care, just to write for the post-surgical antibiotics on a surgery done by a different surgeon.   Because interventional pain management is part of the board-certified specialty for the doctors, the patient was informed that joining this practice means that they are open to any and all interventional therapies. I made it clear that this does not mean that they will be forced to have any procedures done. What this means is that I believe interventional therapies to be essential part of the diagnosis and proper management of chronic pain conditions. Therefore, patients not interested in these interventional alternatives will be better served under the care of a different practitioner.  The patient was also made aware of my Comprehensive Pain Management Safety Guidelines where by joining this practice, they limit all of their nerve blocks and joint injections to those done by our practice, for as long as we are retained to manage their care. Historic Controlled Substance Pharmacotherapy Review  PMP and historical list of controlled substances: Oxycodone 5 mg, tramadol 50 mg Highest opioid analgesic regimen found: Oxycodone 5 mg 2 tablets 4 times daily (fill date 11/12/2017) oxycodone 40 mg/day Most recent opioid analgesic: None Current opioid analgesics: None Highest recorded MME/day: 60 mg/day MME/day: 0 mg/day Medications: The patient did not bring the medication(s) to  the appointment, as requested in our "New Patient Package" Pharmacodynamics: Desired effects: Analgesia: The patient reports >50% benefit. Reported improvement in function: The patient reports medication allows her to accomplish basic ADLs. Clinically meaningful improvement in function (CMIF): Sustained CMIF goals met Perceived effectiveness: Described as relatively effective, allowing for increase in activities of daily living (ADL) Undesirable effects: Side-effects or Adverse reactions: None  reported Historical Monitoring: The patient  reports no history of drug use. List of all UDS Test(s): No results found for: MDMA, COCAINSCRNUR, PCPSCRNUR, PCPQUANT, CANNABQUANT, THCU, [place] List of all Serum Drug Screening Test(s):  No results found for: AMPHSCRSER, BARBSCRSER, BENZOSCRSER, COCAINSCRSER, PCPSCRSER, PCPQUANT, THCSCRSER, CANNABQUANT, OPIATESCRSER, OXYSCRSER, PROPOXSCRSER Historical Background Evaluation: [place] PDMP: Six (6) year initial data search conducted.             [place] Department of public safety, offender search: Editor, commissioning Information) Non-contributory Risk Assessment Profile: Aberrant behavior: None observed or detected today Risk factors for fatal opioid overdose: None identified today Fatal overdose hazard ratio (HR): Calculation deferred Non-fatal overdose hazard ratio (HR): Calculation deferred Risk of opioid abuse or dependence: 0.7-3.0% with doses ? 36 MME/day and 6.1-26% with doses ? 120 MME/day. Substance use disorder (SUD) risk level: Pending results of Medical Psychology Evaluation for SUD Opioid risk tool (ORT) (Total Score): 0  ORT Scoring interpretation table:  Score <3 = Low Risk for SUD  Score between 4-7 = Moderate Risk for SUD  Score >8 = High Risk for Opioid Abuse   PHQ-2 Depression Scale:  Total score:    PHQ-2 Scoring interpretation table: (Score and probability of major depressive disorder)  Score 0 = No depression  Score 1 = 15.4% Probability  Score 2 = 21.1% Probability  Score 3 = 38.4% Probability  Score 4 = 45.5% Probability  Score 5 = 56.4% Probability  Score 6 = 78.6% Probability   PHQ-9 Depression Scale:  Total score:    PHQ-9 Scoring interpretation table:  Score 0-4 = No depression  Score 5-9 = Mild depression  Score 10-14 = Moderate depression  Score 15-19 = Moderately severe depression  Score 20-27 = Severe depression (2.4 times higher risk of SUD and 2.89 times higher risk of overuse)   Pharmacologic Plan: Pending ordered  tests and/or consults  Meds  The patient has a current medication list which includes the following prescription(s): albuterol, aspirin-acetaminophen-caffeine, diphenhydramine, epinephrine, famotidine, gabapentin, losartan, methocarbamol, metronidazole, and oxycodone.  Current Outpatient Medications on File Prior to Visit  Medication Sig  . albuterol (PROVENTIL HFA;VENTOLIN HFA) 108 (90 Base) MCG/ACT inhaler Inhale 2 puffs into the lungs every 6 (six) hours as needed for wheezing or shortness of breath.  Marland Kitchen aspirin-acetaminophen-caffeine (EXCEDRIN MIGRAINE) 250-250-65 MG tablet Take 2 tablets by mouth every 6 (six) hours as needed for headache.  . diphenhydrAMINE (BENADRYL) 25 mg capsule Take 25 mg by mouth every 6 (six) hours as needed (for allergies.).  Marland Kitchen EPINEPHrine (AUVI-Q) 0.3 mg/0.3 mL IJ SOAJ injection Use as directed for severe allergic reaction  . famotidine (PEPCID) 20 MG tablet Take 1 tablet (20 mg total) by mouth 2 (two) times daily.  Marland Kitchen gabapentin (NEURONTIN) 300 MG capsule Take 300 mg by mouth 3 (three) times daily.  Marland Kitchen losartan (COZAAR) 100 MG tablet Take 1 tablet (100 mg total) by mouth daily.  . methocarbamol (ROBAXIN) 500 MG tablet Take 1 tablet (500 mg total) by mouth every 6 (six) hours as needed for muscle spasms.  . metroNIDAZOLE (FLAGYL) 500 MG tablet Take 1 tablet (500 mg total)  by mouth 2 (two) times daily.  Marland Kitchen oxyCODONE (OXY IR/ROXICODONE) 5 MG immediate release tablet Take 1 tablet (5 mg total) by mouth every 3 (three) hours as needed for moderate pain ((score 4 to 6)).   No current facility-administered medications on file prior to visit.    Imaging Review  Cervical Imaging: Cervical MR wo contrast:  Results for orders placed during the hospital encounter of 06/10/18  MR CERVICAL SPINE WO CONTRAST   Narrative CLINICAL DATA:  Cervical myelopathy. Posterior and right lateral neck pain. Left leg numbness. Bilateral arm and hand numbness.  EXAM: MRI CERVICAL SPINE  WITHOUT CONTRAST  TECHNIQUE: Multiplanar, multisequence MR imaging of the cervical spine was performed. No intravenous contrast was administered.  COMPARISON:  None.  FINDINGS: Alignment: AP alignment is anatomic.  Vertebrae: Marrow signal is obscured by fusion hardware at C4, C5, C6, and C7.  Cord: Normal signal is present in the cervical and upper thoracic spinal cord to the lowest imaged level T3-4.  Posterior Fossa, vertebral arteries, paraspinal tissues: A relatively empty sella is present. Intracranial contents are otherwise normal. Flow is present in the basilar artery. The craniocervical junction is otherwise normal.  Disc levels:  C2-3: Negative.  C3-4: Negative. A mild broad-based disc osteophyte complex present. Uncovertebral spurring contributes to mild foraminal narrowing bilaterally.  C4-5: A leftward disc osteophyte complex is present. Uncovertebral and facet disease contribute to mild left foraminal narrowing. There is partial effacement of ventral CSF.  C5-6: Anterior fusion is present. Mild left foraminal narrowing is due to residual uncovertebral disease.  C6-7: Anterior fusion is present. Mild left foraminal narrowing is due to residual uncovertebral disease.  C7-T1: Minimal left-sided uncovertebral spurring is present without significant stenosis.  IMPRESSION: 1. Anterior fusion at C4-5, C5-6, and C6-7. 2. Mild residual central canal narrowing at C4-5. 3. Mild left foraminal narrowing at each of the fused levels. 4. Mild foraminal narrowing bilaterally at C3-4, adjacent to the fusion.   Electronically Signed   By: San Morelle M.D.   On: 06/10/2018 12:00     Cervical CT wo contrast:  Results for orders placed during the hospital encounter of 06/10/18  CT CERVICAL SPINE WO CONTRAST   Narrative CLINICAL DATA:  Left-sided numbness since ACDF 1 year ago.  EXAM: CT CERVICAL SPINE WITHOUT CONTRAST  TECHNIQUE: Multidetector CT  imaging of the cervical spine was performed without intravenous contrast. Multiplanar CT image reconstructions were also generated.  COMPARISON:  MRI cervical spine from same day. Cervical spine x-rays dated July 01, 2017.  FINDINGS: Alignment: Normal.  Skull base and vertebrae: Prior C4-C7 ACDF with solid fusion. The right C7 screw and plate are slightly offset from the vertebral body. No acute fracture. No primary bone lesion or focal pathologic process.  Soft tissues and spinal canal: Normal prevertebral soft tissues.  Disc levels:  C2-C3:  Negative.  C3-C4: Tiny central disc protrusion. Mild bilateral uncovertebral hypertrophy with mild bilateral neuroforaminal stenosis.  C4-C5: Prior ACDF. Mild left neuroforaminal stenosis due to facet uncovertebral hypertrophy.  C5-C6: Prior ACDF. Mild bilateral facet uncovertebral hypertrophy. Mild left neuroforaminal stenosis.  C6-C7: Prior ACDF. Mild left facet uncovertebral hypertrophy. Mild left neuroforaminal stenosis.  C7-T1:  Negative.  Upper chest: Negative.  Other: None.  IMPRESSION: 1. Prior C4-C7 ACDF with solid fusion. The right C7 screw and plate are slightly offset from the vertebral body. 2. Mild residual left-sided neuroforaminal stenosis at the fused levels. 3. Mild bilateral neuroforaminal stenosis at C3-C4.   Electronically Signed   By:  Titus Dubin M.D.   On: 06/10/2018 13:46     Cervical DG 2-3 views:  Results for orders placed during the hospital encounter of 07/01/17  DG Cervical Spine 2 or 3 views   Narrative CLINICAL DATA:  Status post cervical spine fusion.  EXAM: CERVICAL SPINE - 2-3 VIEW  COMPARISON:  None  FINDINGS: Anterior C4 through C7 fusion with interbody spacers. C7-T1 alignment is not well evaluated due to overlying osseous and soft tissue structures. Alignment otherwise maintained. Recent postsurgical change with air in the prevertebral soft  tissues.  IMPRESSION: Post anterior C4 through C7 fusion with interbody spacers. No immediate postoperative complication.   Electronically Signed   By: Jeb Levering M.D.   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  EXAM: CERVICAL SPINE - 2-3 VIEW; DG C-ARM 61-120 MIN  COMPARISON:  None.  FINDINGS: Multiple C-arm images demonstrate the patient has undergone anterior cervical fusion at C4-5, C5-6, and C6-7. Alignment appears anatomic.  IMPRESSION: Anterior cervical fusion performed from C4-5 through C6-7.  FLUOROSCOPY TIME:  8 seconds.  C-arm fluoroscopic images were obtained intraoperatively and submitted for post operative interpretation.   Electronically Signed   By: Lorriane Shire M.D.   On: 07/01/2017 17:30           ROS  Cardiovascular History: High blood pressure Pulmonary or Respiratory History: Shortness of breath Neurological History: No reported neurological signs or symptoms such as seizures, abnormal skin sensations, urinary and/or fecal incontinence, being born with an abnormal open spine and/or a tethered spinal cord Review of Past Neurological Studies:  Results for orders placed or performed during the hospital encounter of 06/04/17  MR Brain Wo Contrast   Narrative   CLINICAL DATA:  RIGHT arm and RIGHT leg numbness for 3 weeks. RIGHT carpal tunnel.  EXAM: MRI HEAD WITHOUT CONTRAST  MRI CERVICAL SPINE WITHOUT CONTRAST  TECHNIQUE: Multiplanar, multiecho pulse sequences of the brain and surrounding structures, and cervical spine, to include the craniocervical junction and cervicothoracic junction, were obtained without intravenous contrast.  COMPARISON:  CT HEAD and cervical spine June 04, 2017 at 0056 hours  FINDINGS: MRI HEAD FINDINGS- braces artifact perturbing field homogeneity, obscuring frontal lobes on multiple  sequences.  BRAIN: No reduced diffusion to suggest acute ischemia or hyperacute demyelination. No susceptibility artifact to suggest hemorrhage. The ventricles and sulci are normal for patient's age. No suspicious parenchymal signal, mass or mass effect. No abnormal extra-axial fluid collections.  VASCULAR: Normal major intracranial vascular flow voids present at skull base.  SKULL AND UPPER CERVICAL SPINE: Partially empty sella. No suspicious calvarial bone marrow signal. Craniocervical junction maintained.  SINUSES/ORBITS: Mild ethmoid mucosal thickening. Mastoid air cells are well aerated. The included ocular globes and orbital contents are non-suspicious.  OTHER: None.  MRI CERVICAL SPINE FINDINGS  ALIGNMENT: Straightened cervical lordosis.  No malalignment.  VERTEBRAE/DISCS: Vertebral bodies are intact. Intervertebral disc morphology's and signal are normal. Mild C5-6 and C6-7 ventral endplate spurring.  CORD:Cervical spinal cord is normal morphology and signal characteristics from the cervicomedullary junction to level of T1-2, the most caudal well visualized level.  POSTERIOR FOSSA, VERTEBRAL ARTERIES, PARASPINAL TISSUES: No MR findings of ligamentous injury. Vertebral artery flow voids present. Included posterior fossa and paraspinal soft tissues are normal.  DISC LEVELS:  C2-3: No disc bulge, canal stenosis nor neural foraminal narrowing.  C3-4: Mild uncovertebral hypertrophy without canal stenosis. Mild LEFT neural  foraminal narrowing.  C4-5: 4 mm RIGHT central disc protrusion with bright STIR signal suggesting acute process. RIGHT ventral cord deformity. Mild to moderate canal stenosis. Moderate LEFT neural foraminal narrowing.  C5-6: 4 mm RIGHT central disc protrusion bright STIR signal suggesting acute process. RIGHT ventral cord deformity. Moderate canal stenosis. Mild neural foraminal narrowing.  C6-7: Small central disc protrusion, uncovertebral  hypertrophy. Mild canal stenosis without neural foraminal narrowing.  C7-T1: No disc bulge, canal stenosis nor neural foraminal narrowing.  IMPRESSION: MRI head:  1. Limited by braces artifact.  No acute intracranial process. 2. Partially empty sella, otherwise negative MRI of the head without contrast. MRI cervical spine:  1. 4 mm RIGHT central C4-5 and C5-6 disc protrusions compressing the spinal cord without cord edema. Small disc protrusions C6-7. 2. Moderate canal stenosis C5-6, mild to moderate C4-5 and mild at C6-7. 3. Neural foraminal narrowing C3-4 thru C5-6: Moderate on the LEFT at C4-5.   Electronically Signed   By: Elon Alas M.D.   On: 06/04/2017 03:15   CT Head Wo Contrast   Narrative   CLINICAL DATA:  Numbness and tingling right arm and leg 3 weeks. Right knee swelling.  EXAM: CT HEAD WITHOUT CONTRAST  CT CERVICAL SPINE WITHOUT CONTRAST  TECHNIQUE: Multidetector CT imaging of the head and cervical spine was performed following the standard protocol without intravenous contrast. Multiplanar CT image reconstructions of the cervical spine were also generated.  COMPARISON:  None.  FINDINGS: CT HEAD FINDINGS  Brain: No evidence of acute infarction, hemorrhage, hydrocephalus, extra-axial collection or mass lesion/mass effect.  Vascular: No hyperdense vessel or unexpected calcification.  Skull: Normal. Negative for fracture or focal lesion.  Sinuses/Orbits: No acute finding.  Other: None.  CT CERVICAL SPINE FINDINGS  Alignment: Mild reversal of the normal cervical lordosis. No traumatic subluxation.  Skull base and vertebrae: Minimal spondylosis of the cervical spine. Vertebral body heights maintained. Atlantoaxial articulation is within normal. Minimal uncovertebral joint spurring. No acute fracture.  Soft tissues and spinal canal: No prevertebral fluid or swelling. No visible canal hematoma.  Disc levels: Minimal disc space  narrowing at the C5-6 and C6-7 levels.  Upper chest: Negative.  Other: None.  IMPRESSION: No acute intracranial findings.  No acute cervical spine injury.  Mild spondylosis of the cervical spine with minimal disc disease at the C5-6 and C6-7 levels.   Electronically Signed   By: Marin Olp M.D.   On: 06/04/2017 01:16    Psychological-Psychiatric History: No reported psychological or psychiatric signs or symptoms such as difficulty sleeping, anxiety, depression, delusions or hallucinations (schizophrenial), mood swings (bipolar disorders) or suicidal ideations or attempts Gastrointestinal History: No reported gastrointestinal signs or symptoms such as vomiting or evacuating blood, reflux, heartburn, alternating episodes of diarrhea and constipation, inflamed or scarred liver, or pancreas or irrregular and/or infrequent bowel movements Genitourinary History: Difficulty emptying the bladder or controlling the flow of urine (Neurogenic bladder) Hematological History: No reported hematological signs or symptoms such as prolonged bleeding, low or poor functioning platelets, bruising or bleeding easily, hereditary bleeding problems, low energy levels due to low hemoglobin or being anemic Endocrine History: No reported endocrine signs or symptoms such as high or low blood sugar, rapid heart rate due to high thyroid levels, obesity or weight gain due to slow thyroid or thyroid disease Rheumatologic History: No reported rheumatological signs and symptoms such as fatigue, joint pain, tenderness, swelling, redness, heat, stiffness, decreased range of motion, with or without associated rash Musculoskeletal History: Negative for myasthenia  gravis, muscular dystrophy, multiple sclerosis or malignant hyperthermia Work History: Working full time  Allergies  Ms. Rosen is allergic to other; shellfish allergy; strawberry (diagnostic); and tomato.  Laboratory Chemistry  Inflammation Markers No  results found for: CRP, ESRSEDRATE (CRP: Acute Phase) (ESR: Chronic Phase) Renal Function Markers Lab Results  Component Value Date   BUN 13 08/03/2018   CREATININE 1.04 08/03/2018   GFRAA >60 04/28/2018   GFRNONAA >60 04/28/2018   Hepatic Function Markers Lab Results  Component Value Date   AST 11 08/03/2018   ALT 10 08/03/2018   ALBUMIN 4.2 08/03/2018   ALKPHOS 35 (L) 08/03/2018   Electrolytes Lab Results  Component Value Date   NA 138 08/03/2018   K 3.7 08/03/2018   CL 103 08/03/2018   CALCIUM 9.6 08/03/2018   Neuropathy Markers No results found for: [id number] Bone Pathology Markers Lab Results  Component Value Date   ALKPHOS 35 (L) 08/03/2018   CALCIUM 9.6 08/03/2018   Coagulation Parameters Lab Results  Component Value Date   INR 1.02 06/25/2017   LABPROT 13.3 06/25/2017   APTT 30 06/25/2017   PLT 286.0 08/03/2018   Cardiovascular Markers Lab Results  Component Value Date   HGB 12.2 08/03/2018   HCT 37.1 08/03/2018   Note: Lab results reviewed.  PFSH  Drug: Ms. Rasnic  reports no history of drug use. Alcohol:  reports current alcohol use. Tobacco:  reports that she has never smoked. She has never used smokeless tobacco. Medical:  has a past medical history of Arthritis, Carpal boss, right, Carpal tunnel syndrome, bilateral, Headache, and Hypertension. Family: family history includes [name] in her mother; Arthritis in her father, mother, and sister; COPD in her brother and son; Cancer in her brother; Depression in her son; Diabetes in her mother; Early death in her brother; Hearing loss in her father; Hyperlipidemia in her mother; Hypertension in her father, mother, and sister; Kidney disease in her father.  Past Surgical History:  Procedure Laterality Date  . ANTERIOR CERVICAL DECOMP/DISCECTOMY FUSION N/A 07/01/2017   Procedure: ANTERIOR CERVICAL DECOMPRESSION/DISCECTOMY FUSION 3 LEVELS;  Surgeon: Meade Maw, MD;  Location: [facility];   Service: Neurosurgery;  Laterality: N/A;  . KNEE ARTHROSCOPY Right    Active Ambulatory Problems    Diagnosis Date Noted  . Cervical myelopathy ([place]) 07/01/2017  . Essential hypertension 05/31/2018  . Gout of left foot 05/31/2018  . Numbness and tingling of both feet 05/31/2018  . Hyperkalemia 05/31/2018  . Chronic bilateral low back pain without sciatica 07/26/2018  . Chronic neck pain (Primary Area of Pain) (L>R) 09/07/2018  . Chronic upper extremity pain (Secondary Area of Pain) (R>L) 09/07/2018  . Chronic bilateral low back pain with bilateral sciatica [facility] Area of Pain) (R>L) 09/07/2018  . Chronic pain of both lower extremities (Fourth Area of Pain) (R>L) 09/07/2018  . Chronic pain syndrome 09/07/2018  . Pharmacologic therapy 09/07/2018  . Disorder of skeletal system 09/07/2018  . Problems influencing health status 09/07/2018  . Chronic right sacroiliac joint pain 09/07/2018   Resolved Ambulatory Problems    Diagnosis Date Noted  . No Resolved Ambulatory Problems   Past Medical History:  Diagnosis Date  . Arthritis   . Carpal boss, right   . Carpal tunnel syndrome, bilateral   . Headache   . Hypertension    Constitutional Exam  General appearance: Well nourished, well developed, and well hydrated. In no apparent acute distress Vitals:   09/07/18 1008  BP: (!) 155/93  Pulse: 60  Temp: 98.3 F (36.8 C)  SpO2: 99%  Weight: 226 lb (102.5 kg)  Height: _0  (1.6 m)   BMI Assessment: Estimated body mass index is 40.03 kg/m as calculated from the following:   Height as of this encounter: _1  (1.6 m).   Weight as of this encounter: 226 lb (102.5 kg).  BMI interpretation table: BMI level Category Range association with higher incidence of chronic pain  <18 kg/m2 Underweight   18.5-24.9 kg/m2 Ideal body weight   25-29.9 kg/m2 Overweight Increased incidence by 20%  30-34.9 kg/m2 Obese (Class I) Increased incidence by 68%  35-39.9 kg/m2 Severe obesity (Class  II) Increased incidence by 136%  >40 kg/m2 Extreme obesity (Class III) Increased incidence by 254%   BMI Readings from Last 4 Encounters:  09/07/18 40.03 kg/m  09/06/18 40.21 kg/m  08/17/18 39.68 kg/m  08/03/18 41.34 kg/m   Wt Readings from Last 4 Encounters:  09/07/18 226 lb (102.5 kg)  09/06/18 227 lb (103 kg)  08/17/18 224 lb (101.6 kg)  08/03/18 226 lb (102.5 kg)  Psych/Mental status: Alert, oriented x 3 (person, place, & time)       Eyes: PERLA Respiratory: No evidence of acute respiratory distress  Cervical Spine Exam  Inspection: Well healed scar from previous spine surgery detected Alignment: Asymmetric anterior aspect to left  Functional ROM: Adequate ROM      Stability: No instability detected Muscle strength & Tone: Functionally intact Sensory: Unimpaired Palpation: Complains of area being tender to palpation              Upper Extremity (UE) Exam    Side: Right upper extremity  Side: Left upper extremity  Inspection: No masses, redness, swelling, or asymmetry. No contractures  Inspection: No masses, redness, swelling, or asymmetry. No contractures  Functional ROM: Unrestricted ROM          Functional ROM: Unrestricted ROM          Muscle strength & Tone: Functionally intact  Muscle strength & Tone: Functionally intact  Sensory: Dermatomal pain pattern  Sensory: Dermatomal pain pattern  Palpation: No palpable anomalies              Palpation: No palpable anomalies              Specialized Test(s): Deferred         Specialized Test(s): Deferred          Thoracic Spine Exam  Inspection: No masses, redness, or swelling Alignment: Symmetrical Functional ROM: Unrestricted ROM Stability: No instability detected Sensory: Unimpaired Muscle strength & Tone: No palpable anomalies  Lumbar Spine Exam  Inspection: No masses, redness, or swelling Alignment: Symmetrical Functional ROM: Unrestricted ROM      Stability: No instability detected Muscle strength & Tone:  Functionally intact Sensory: Unimpaired Palpation: Complains of area being tender to palpation       Provocative Tests: Lumbar Hyperextension and rotation test: Positive on the right for facet joint pain. Patrick's Maneuver: Positive for right-sided S-I arthralgia              Gait & Posture Assessment  Ambulation: Unassisted Gait: Awkward Posture: WNL   Lower Extremity Exam    Side: Right lower extremity  Side: Left lower extremity  Inspection: Edema  Inspection: No masses, redness, swelling, or asymmetry. No contractures  Functional ROM: Unrestricted ROM          Functional ROM: Unrestricted ROM          Muscle strength & Tone:  Functionally intact  Muscle strength & Tone: Functionally intact  Sensory: Unimpaired  Sensory: Unimpaired  Palpation: No palpable anomalies  Palpation: No palpable anomalies   Assessment  Primary Diagnosis & Pertinent Problem List: The primary encounter diagnosis was Chronic neck pain (Primary Area of Pain) (L>R). Diagnoses of Chronic pain of both upper extremities, Chronic bilateral low back pain with bilateral sciatica (Tertiary Area of Pain) (R>L), Chronic pain of both lower extremities (Fourth Area of Pain) (R>L), Chronic pain syndrome, Pharmacologic therapy, Disorder of skeletal system, Problems influencing health status, and Chronic right sacroiliac joint pain were also pertinent to this visit.  Visit Diagnosis: 1. Chronic neck pain (Primary Area of Pain) (L>R)   2. Chronic pain of both upper extremities   3. Chronic bilateral low back pain with bilateral sciatica [facility] Area of Pain) (R>L)   4. Chronic pain of both lower extremities (Fourth Area of Pain) (R>L)   5. Chronic pain syndrome   6. Pharmacologic therapy   7. Disorder of skeletal system   8. Problems influencing health status   9. Chronic right sacroiliac joint pain    Plan of Care  Initial treatment plan:  Please be advised that as per protocol, today's visit has been an evaluation  only. We have not taken over the patient's controlled substance management.  Problem-specific plan: No problem-specific Assessment & Plan notes found for this encounter.  Ordered Lab-work, Procedure(s), Referral(s), & Consult(s): Orders Placed This Encounter  Procedures  . DG Si Joints  . DG Lumbar Spine Complete W/Bend  . Compliance Drug Analysis, Ur  . Comp. Metabolic Panel (12)  . Magnesium  . Vitamin B12  . Sedimentation rate  . 25-Hydroxyvitamin D Lcms D2+D3  . C-reactive protein  . Ambulatory referral to Physical Therapy   Pharmacotherapy: Medications ordered:  No orders of the defined types were placed in this encounter.  Medications administered during this visit: Yasmene Salomone. Mu[place] had no medications administered during this visit.   Pharmacotherapy under consideration:  Opioid Analgesics: The patient was informed that there is no guarantee that she would be a candidate for opioid analgesics. The decision will be made following CDC guidelines. This decision will be based on the results of diagnostic studies, as well as Ms. Norem's risk profile.  Membrane stabilizer: To be determined at a later time Muscle relaxant: To be determined at a later time NSAID: To be determined at a later time Other analgesic(s): To be determined at a later time   Interventional therapies under consideration: Ms. Garin was informed that there is no guarantee that she would be a candidate for interventional therapies. The decision will be based on the results of diagnostic studies, as well as Ms. Borre's risk profile.  Possible procedure(s): Diagnostic midline cervical epidural steroid injection Diagnostic bilateral cervical facet nerve block Possible bilateral cervical facet radiofrequency ablation Diagnostic midline lumbar epidural steroid injection Diagnostic bilateral lumbar facet nerve block Possible bilateral lumbar facet radiofrequency ablation Diagnostic right-sided sacroiliac  joint injection Possible right-sided sacroiliac joint radiofrequency ablation   Provider-requested follow-up: Return for 2nd Visit, w/ Dr. Dossie Arbour.  Future Appointments  Date Time Provider [place]  09/22/2018  9:00 AM [facility]  09/23/2018  9:00 AM [facility] N[facility]  09/23/2018  9:30 AM [facility] LAB [facility]  09/27/2018 10:30 AM Milinda Pointer, MD [facility] None  09/28/2018  6:00 PM Kozlow, Donnamarie Poag, MD [facility] None  11/29/2018  9:00 AM Guse, Jacquelynn Cree, FNP [facility]    Primary Care  Physician: Jodelle Green, FNP Location: [facility] Note by:  Date: 09/07/2018; Time: 1:14 PM  Pain Score Disclaimer: We use the NRS-11 scale. This is a self-reported, subjective measurement of pain severity with only modest accuracy. It is used primarily to identify changes within a particular patient. It must be understood that outpatient pain scales are significantly less accurate that those used for research, where they can be applied under ideal controlled circumstances with minimal exposure to variables. In reality, the score is likely to be a combination of pain intensity and pain affect, where pain affect describes the degree of emotional arousal or changes in action readiness caused by the sensory experience of pain. Factors such as social and work situation, setting, emotional state, anxiety levels, expectation, and prior pain experience may influence pain perception and show large inter-individual differences that may also be affected by time variables.  Patient instructions provided during this appointment: Patient Instructions   ____________________________________________________________________________________________  Appointment Policy Summary  It is our goal and responsibility to provide the medical community with assistance in the evaluation and management of patients with chronic pain. Unfortunately our resources are  limited. Because we do not have an unlimited amount of time, or available appointments, we are required to closely monitor and manage their use. The following rules exist to maximize their use:  Patient's responsibilities: 1. Punctuality:  At what time should I arrive? You should be physically present in our office 30 minutes before your scheduled appointment. Your scheduled appointment is with your assigned healthcare provider. However, it takes 5-10 minutes to be "checked-in", and another 15 minutes for the nurses to do the admission. If you arrive to our office at the time you were given for your appointment, you will end up being at least 20-25 minutes late to your appointment with the provider. 2. Tardiness:  What happens if I arrive only a few minutes after my scheduled appointment time? You will need to reschedule your appointment. The cutoff is your appointment time. This is why it is so important that you arrive at least 30 minutes before that appointment. If you have an appointment scheduled for 10:00 AM and you arrive at 10:01, you will be required to reschedule your appointment.  3. Plan ahead:  Always assume that you will encounter traffic on your way in. Plan for it. If you are dependent on a driver, make sure they understand these rules and the need to arrive early. 4. Other appointments and responsibilities:  Avoid scheduling any other appointments before or after your pain clinic appointments.  5. Be prepared:  Write down everything that you need to discuss with your healthcare provider and give this information to the admitting nurse. Write down the medications that you will need refilled. Bring your pills and bottles (even the empty ones), to all of your appointments, except for those where a procedure is scheduled. 6. No children or pets:  Find someone to take care of them. It is not appropriate to bring them in. 7. Scheduling changes:  We request "advanced notification" of any  changes or cancellations. 8. Advanced notification:  Defined as a time period of more than 24 hours prior to the originally scheduled appointment. This allows for the appointment to be offered to other patients. 9. Rescheduling:  When a visit is rescheduled, it will require the cancellation of the original appointment. For this reason they both fall within the category of "Cancellations".  10. Cancellations:  They require advanced notification. Any  cancellation less than 24 hours before the  appointment will be recorded as a "No Show". 11. No Show:  Defined as an unkept appointment where the patient failed to notify or declare to the practice their intention or inability to keep the appointment.  Corrective process for repeat offenders:  1. Tardiness: Three (3) episodes of rescheduling due to late arrivals will be recorded as one (1) "No Show". 2. Cancellation or reschedule: Three (3) cancellations or rescheduling will be recorded as one (1) "No Show". 3. "No Shows": Three (3) "No Shows" within a 12 month period will result in discharge from the practice. ____________________________________________________________________________________________   ______________________________________________________________________________________________  Specialty Pain Scale  Introduction:  There are significant differences in how pain is reported. The word pain usually refers to physical pain, but it is also a common synonym of suffering. The medical community uses a scale from 0 (zero) to 10 (ten) to report pain level. Zero (0) is described as "no pain", while ten (10) is described as "the worse pain you can imagine". The problem with this scale is that physical pain is reported along with suffering. Suffering refers to mental pain, or more often yet it refers to any unpleasant feeling, emotion or aversion associated with the perception of harm or threat of harm. It is the psychological component of  pain.  Pain Specialists prefer to separate the two components. The pain scale used by this practice is the Verbal Numerical Rating Scale (VNRS-11). This scale is for the physical pain only. DO NOT INCLUDE how your pain psychologically affects you. This scale is for adults 107 years of age and older. It has 11 (eleven) levels. The 1st level is 0/10. This means: "right now, I have no pain". In the context of pain management, it also means: "right now, my physical pain is under control with the current therapy".  General Information:  The scale should reflect your current level of pain. Unless you are specifically asked for the level of your worst pain, or your average pain. If you are asked for one of these two, then it should be understood that it is over the past 24 hours.  Levels 1 (one) through 5 (five) are described below, and can be treated as an outpatient. Ambulatory pain management facilities such as ours are more than adequate to treat these levels. Levels 6 (six) through 10 (ten) are also described below, however, these must be treated as a hospitalized patient. While levels 6 (six) and 7 (seven) may be evaluated at an urgent care facility, levels 8 (eight) through 10 (ten) constitute medical emergencies and as such, they belong in a hospital's emergency department. When having these levels (as described below), do not come to our office. Our facility is not equipped to manage these levels. Go directly to an urgent care facility or an emergency department to be evaluated.  Definitions:  Activities of Daily Living (ADL): Activities of daily living (ADL or ADLs) is a term used in healthcare to refer to people's daily self-care activities. Health professionals often use a person's ability or inability to perform ADLs as a measurement of their functional status, particularly in regard to people post injury, with disabilities and the elderly. There are two ADL levels: Basic and Instrumental. Basic  Activities of Daily Living (BADL  or BADLs) consist of self-care tasks that include: Bathing and showering; personal hygiene and grooming (including brushing/combing/styling hair); dressing; Toilet hygiene (getting to the toilet, cleaning oneself, and getting back up); eating and self-feeding (not  including cooking or chewing and swallowing); functional mobility, often referred to as "transferring", as measured by the ability to walk, get in and out of bed, and get into and out of a chair; the broader definition (moving from one place to another while performing activities) is useful for people with different physical abilities who are still able to get around independently. Basic ADLs include the things many people do when they get up in the morning and get ready to go out of the house: get out of bed, go to the toilet, bathe, dress, groom, and eat. On the average, loss of function typically follows a particular order. Hygiene is the first to go, followed by loss of toilet use and locomotion. The last to go is the ability to eat. When there is only one remaining area in which the person is independent, there is a 62.9% chance that it is eating and only a 3.5% chance that it is hygiene. Instrumental Activities of Daily Living (IADL or IADLs) are not necessary for fundamental functioning, but they let an individual live independently in a community. IADL consist of tasks that include: cleaning and maintaining the house; home establishment and maintenance; care of others (including selecting and supervising caregivers); care of pets; child rearing; managing money; managing financials (investments, etc.); meal preparation and cleanup; shopping for groceries and necessities; moving within the community; safety procedures and emergency responses; health management and maintenance (taking prescribed medications); and using the telephone or other form of communication.  Instructions:  Most patients tend to report  their pain as a combination of two factors, their physical pain and their psychosocial pain. This last one is also known as "suffering" and it is reflection of how physical pain affects you socially and psychologically. From now on, report them separately.  From this point on, when asked to report your pain level, report only your physical pain. Use the following table for reference.  Pain Clinic Pain Levels (0-5/10)  Pain Level Score  Description  No Pain 0   Mild pain 1 Nagging, annoying, but does not interfere with basic activities of daily living (ADL). Patients are able to eat, bathe, get dressed, toileting (being able to get on and off the toilet and perform personal hygiene functions), transfer (move in and out of bed or a chair without assistance), and maintain continence (able to control bladder and bowel functions). Blood pressure and heart rate are unaffected. A normal heart rate for a healthy adult ranges from 60 to 100 bpm (beats per minute).   Mild to moderate pain 2 Noticeable and distracting. Impossible to hide from other people. More frequent flare-ups. Still possible to adapt and function close to normal. It can be very annoying and may have occasional stronger flare-ups. With discipline, patients may get used to it and adapt.   Moderate pain 3 Interferes significantly with activities of daily living (ADL). It becomes difficult to feed, bathe, get dressed, get on and off the toilet or to perform personal hygiene functions. Difficult to get in and out of bed or a chair without assistance. Very distracting. With effort, it can be ignored when deeply involved in activities.   Moderately severe pain 4 Impossible to ignore for more than a few minutes. With effort, patients may still be able to manage work or participate in some social activities. Very difficult to concentrate. Signs of autonomic nervous system discharge are evident: dilated pupils (mydriasis); mild sweating (diaphoresis);  sleep interference. Heart rate becomes elevated (>115 bpm). Diastolic  blood pressure (lower number) rises above 100 mmHg. Patients find relief in laying down and not moving.   Severe pain 5 Intense and extremely unpleasant. Associated with frowning face and frequent crying. Pain overwhelms the senses.  Ability to do any activity or maintain social relationships becomes significantly limited. Conversation becomes difficult. Pacing back and forth is common, as getting into a comfortable position is nearly impossible. Pain wakes you up from deep sleep. Physical signs will be obvious: pupillary dilation; increased sweating; goosebumps; brisk reflexes; cold, clammy hands and feet; nausea, vomiting or dry heaves; loss of appetite; significant sleep disturbance with inability to fall asleep or to remain asleep. When persistent, significant weight loss is observed due to the complete loss of appetite and sleep deprivation.  Blood pressure and heart rate becomes significantly elevated. Caution: If elevated blood pressure triggers a pounding headache associated with blurred vision, then the patient should immediately seek attention at an urgent or emergency care unit, as these may be signs of an impending stroke.    Emergency Department Pain Levels (6-10/10)  Emergency Room Pain 6 Severely limiting. Requires emergency care and should not be seen or managed at an outpatient pain management facility. Communication becomes difficult and requires great effort. Assistance to reach the emergency department may be required. Facial flushing and profuse sweating along with potentially dangerous increases in heart rate and blood pressure will be evident.   Distressing pain 7 Self-care is very difficult. Assistance is required to transport, or use restroom. Assistance to reach the emergency department will be required. Tasks requiring coordination, such as bathing and getting dressed become very difficult.   Disabling pain 8  Self-care is no longer possible. At this level, pain is disabling. The individual is unable to do even the most "basic" activities such as walking, eating, bathing, dressing, transferring to a bed, or toileting. Fine motor skills are lost. It is difficult to think clearly.   Incapacitating pain 9 Pain becomes incapacitating. Thought processing is no longer possible. Difficult to remember your own name. Control of movement and coordination are lost.   The worst pain imaginable 10 At this level, most patients pass out from pain. When this level is reached, collapse of the autonomic nervous system occurs, leading to a sudden drop in blood pressure and heart rate. This in turn results in a temporary and dramatic drop in blood flow to the brain, leading to a loss of consciousness. Fainting is one of the body's self defense mechanisms. Passing out puts the brain in a calmed state and causes it to shut down for a while, in order to begin the healing process.    Summary: 1. Refer to this scale when providing [place] with your pain level. 2. Be accurate and careful when reporting your pain level. This will help with your care. 3. Over-reporting your pain level will lead to loss of credibility. 4. Even a level of 1/10 means that there is pain and will be treated at our facility. 5. High, inaccurate reporting will be documented as "Symptom Exaggeration", leading to loss of credibility and suspicions of possible secondary gains such as obtaining more narcotics, or wanting to appear disabled, for fraudulent reasons. 6. Only pain levels of 5 or below will be seen at our facility. 7. Pain levels of 6 and above will be sent to the Emergency Department and the appointment cancelled. ______________________________________________________________________________________________   BMI Assessment: Estimated body mass index is 40.03 kg/m as calculated from the following:   Height as of  this encounter: _0  (1.6 m).    Weight as of this encounter: 226 lb (102.5 kg).  BMI interpretation table: BMI level Category Range association with higher incidence of chronic pain  <18 kg/m2 Underweight   18.5-24.9 kg/m2 Ideal body weight   25-29.9 kg/m2 Overweight Increased incidence by 20%  30-34.9 kg/m2 Obese (Class I) Increased incidence by 68%  35-39.9 kg/m2 Severe obesity (Class II) Increased incidence by 136%  >40 kg/m2 Extreme obesity (Class III) Increased incidence by 254%      BMI Readings from Last 4 Encounters:  09/07/18 40.03 kg/m  09/06/18 40.21 kg/m  08/17/18 39.68 kg/m  08/03/18 41.34 kg/m      Wt Readings from Last 4 Encounters:  09/07/18 226 lb (102.5 kg)  09/06/18 227 lb (103 kg)  08/17/18 224 lb (101.6 kg)  08/03/18 226 lb (102.5 kg)

## 2018-09-08 ENCOUNTER — Encounter: Payer: Self-pay | Admitting: Nurse Practitioner

## 2018-09-08 DIAGNOSIS — R700 Elevated erythrocyte sedimentation rate: Secondary | ICD-10-CM | POA: Insufficient documentation

## 2018-09-08 HISTORY — DX: Elevated erythrocyte sedimentation rate: R70.0

## 2018-09-08 NOTE — Progress Notes (Signed)
Results were reviewed and found to be: mildly abnormal  No acute injury or pathology identified  Review would suggest interventional pain management techniques may be of benefit 

## 2018-09-09 LAB — CERVICOVAGINAL ANCILLARY ONLY: Herpes: NEGATIVE

## 2018-09-11 LAB — COMPLIANCE DRUG ANALYSIS, UR

## 2018-09-12 ENCOUNTER — Other Ambulatory Visit: Payer: Self-pay | Admitting: Allergy and Immunology

## 2018-09-12 LAB — 25-HYDROXY VITAMIN D LCMS D2+D3
25-Hydroxy, Vitamin D-2: <1 ng/mL
25-Hydroxy, Vitamin D-3: 8.1 ng/mL
25-Hydroxy, Vitamin D: 8.1 ng/mL — ABNORMAL LOW

## 2018-09-12 LAB — SEDIMENTATION RATE: Sed Rate: 67 mm/hr — ABNORMAL HIGH (ref 0–32)

## 2018-09-12 LAB — COMP. METABOLIC PANEL (12)
AST: 10 IU/L (ref 0–40)
Albumin/Globulin Ratio: 1.2 (ref 1.2–2.2)
Albumin: 4.3 g/dL (ref 3.5–5.5)
Alkaline Phosphatase: 42 IU/L (ref 39–117)
BUN/Creatinine Ratio: 13 (ref 9–23)
BUN: 13 mg/dL (ref 6–24)
Bilirubin Total: 0.6 mg/dL (ref 0.0–1.2)
Calcium: 9.6 mg/dL (ref 8.7–10.2)
Chloride: 105 mmol/L (ref 96–106)
Creatinine, Ser: 1.03 mg/dL — ABNORMAL HIGH (ref 0.57–1.00)
GFR calc Af Amer: 76 mL/min/{1.73_m2} (ref >59)
GFR calc non Af Amer: 66 mL/min/{1.73_m2} (ref >59)
Globulin, Total: 3.5 g/dL (ref 1.5–4.5)
Glucose: 88 mg/dL (ref 65–99)
Potassium: 3.9 mmol/L (ref 3.5–5.2)
Sodium: 142 mmol/L (ref 134–144)
Total Protein: 7.8 g/dL (ref 6.0–8.5)

## 2018-09-12 LAB — MAGNESIUM: Magnesium: 2.2 mg/dL (ref 1.6–2.3)

## 2018-09-12 LAB — VITAMIN B12: Vitamin B-12: 601 pg/mL (ref 232–1245)

## 2018-09-12 LAB — C-REACTIVE PROTEIN: CRP: 7 mg/L (ref 0–10)

## 2018-09-13 ENCOUNTER — Ambulatory Visit: Payer: BC Managed Care – PPO | Admitting: Nurse Practitioner

## 2018-09-20 ENCOUNTER — Other Ambulatory Visit: Payer: BC Managed Care – PPO

## 2018-09-22 ENCOUNTER — Ambulatory Visit
Admission: RE | Admit: 2018-09-22 | Discharge: 2018-09-22 | Disposition: A | Discharge status: Home / Self Care | Payer: BC Managed Care – PPO | Source: Ambulatory Visit | Attending: Family Medicine | Admitting: Family Medicine

## 2018-09-22 ENCOUNTER — Telehealth: Payer: Self-pay | Admitting: Family Medicine

## 2018-09-22 ENCOUNTER — Other Ambulatory Visit: Payer: Self-pay | Admitting: Lab

## 2018-09-22 DIAGNOSIS — N760 Acute vaginitis: Secondary | ICD-10-CM

## 2018-09-22 DIAGNOSIS — Z1231 Encounter for screening mammogram for malignant neoplasm of breast: Secondary | ICD-10-CM

## 2018-09-22 DIAGNOSIS — B9689 Other specified bacterial agents as the cause of diseases classified elsewhere: Secondary | ICD-10-CM

## 2018-09-22 DIAGNOSIS — N898 Other specified noninflammatory disorders of vagina: Secondary | ICD-10-CM

## 2018-09-22 MED ORDER — METRONIDAZOLE 500 MG PO TABS: 500.0000 mg | ORAL_TABLET | Freq: Two times a day (BID) | ORAL | 0 refills | Status: DC

## 2018-09-22 NOTE — Telephone Encounter (Signed)
Copied from [place]. Topic: General - Other >> Sep 22, 2018  8:51 AM Carolyn Stare wrote:  Pt call to ask for a refill on metroNIDAZOLE (FLAGYL) 500 MG tablet     still having discharge   [place]

## 2018-09-22 NOTE — Telephone Encounter (Signed)
I will do refill of this x1, if not improving she needs to come back in for appt  Also -- [facility]?

## 2018-09-22 NOTE — Telephone Encounter (Signed)
Pt called [facility] call to ask for a refill on metroNIDAZOLE (FLAGYL) 500 MG tablet     still having discharge   [place]

## 2018-09-22 NOTE — Telephone Encounter (Signed)
Called Pt to tell her the Rx was sent in to the pharmacy for the Flagyl, and if it is not improving she needs make an appt to come back to our office. I also asked the Pt [facility] and she said yes.

## 2018-09-23 ENCOUNTER — Other Ambulatory Visit (INDEPENDENT_AMBULATORY_CARE_PROVIDER_SITE_OTHER): Payer: BC Managed Care – PPO

## 2018-09-23 ENCOUNTER — Ambulatory Visit (INDEPENDENT_AMBULATORY_CARE_PROVIDER_SITE_OTHER): Payer: BC Managed Care – PPO

## 2018-09-23 ENCOUNTER — Ambulatory Visit: Payer: BC Managed Care – PPO | Admitting: Allergy & Immunology

## 2018-09-23 VITALS — BP 160/84

## 2018-09-23 DIAGNOSIS — I10 Essential (primary) hypertension: Secondary | ICD-10-CM

## 2018-09-23 LAB — BASIC METABOLIC PANEL
BUN: 11 mg/dL (ref 6–23)
CO2: 25 mEq/L (ref 19–32)
Calcium: 9.1 mg/dL (ref 8.4–10.5)
Chloride: 108 mEq/L (ref 96–112)
Creatinine, Ser: 0.89 mg/dL (ref 0.40–1.20)
GFR: 82.91 mL/min (ref >60.00)
Glucose, Bld: 112 mg/dL — ABNORMAL HIGH (ref 70–99)
Potassium: 3.7 mEq/L (ref 3.5–5.1)
Sodium: 142 mEq/L (ref 135–145)

## 2018-09-23 MED ORDER — AMLODIPINE BESYLATE 5 MG PO TABS: 5.0000 mg | ORAL_TABLET | Freq: Every day | ORAL | 1 refills | Status: DC

## 2018-09-23 NOTE — Progress Notes (Signed)
BP still elevated even with increase in losartan. We will add amlodipine 5 mg per day.  Please have patient make follow up appt with me in 2-3 weeks for recheck on BP

## 2018-09-23 NOTE — Progress Notes (Signed)
Patient came in today for BP check after changing medication to Losartan. BP was checked with large cuff in [place].  BP was still elevated & checked again in [place] with systolic number still high.

## 2018-09-23 NOTE — Progress Notes (Signed)
Patient scheduled & notified.

## 2018-09-23 NOTE — Progress Notes (Signed)
Patient's Name: Anita Gibbs  MRN: [id number]  Referring Provider: Jodelle Green, FNP  DOB: April 04, 1971  PCP: Jodelle Green, FNP  DOS: 09/27/2018  Note by: Gaspar Cola, MD  Service setting: Ambulatory outpatient  Specialty: Interventional Pain Management  Location: [facility] (AMB) Pain Management Facility    Patient type: Established   Primary Reason(s) for Visit: Encounter for evaluation before starting new chronic pain management plan of care (Level of risk: moderate) CC: Neck Pain and Back Pain (lower, right is worse)  HPI  Anita Gibbs is a 48 y.o. year old, female patient, who comes today for a follow-up evaluation to review the test results and decide on a treatment plan. She has Cervical myelopathy ([place]); Essential hypertension; Chronic gout of foot (Left); Numbness and tingling of both feet; Hyperkalemia; Chronic low back pain [facility] Area of Pain) (Bilateral) (R>L) w/o sciatica; Chronic neck pain (Primary Area of Pain) (Bilateral) (L>R); Chronic low back pain [facility] Area of Pain) (Bilateral) (R>L) w/ sciatica (Bilateral); Chronic lower extremity pain (Fourth Area of Pain) (Bilateral) (R>L); Chronic pain syndrome; Pharmacologic therapy; Disorder of skeletal system; Problems influencing health status; Chronic sacroiliac joint pain (Right); Elevated sed rate; Chronic upper extremity pain (Secondary Area of Pain) (Bilateral) (R>L); Cervicalgia (Primary Area of Pain) (Bilateral) (L>R); History of fusion of cervical spine (ACDF C4-C7); Abnormal MRI, cervical spine (06/10/2018); 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-5, C5-6, and C6-7); Cervical facet joint syndrome (Bilateral) (L>R); Cervical facet hypertrophy (C3-T1); 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 Her primarily concern today is the Neck Pain and Back Pain (lower, right is worse)  Pain Assessment: Location: Right Neck Radiating: radiates down right side of back, through right buttock, down back of right leg to foot, sometimes all toes of right foot feel numb Onset: More than a month ago Duration: Chronic pain Quality: Constant, Numbness, Aching, Sharp, Shooting Severity: 7 /10 (subjective, self-reported pain score)  Note: Reported level is inconsistent with clinical observations. Clinically the patient looks like a 2/10 A 2/10 is viewed as "Mild to Moderate" and described as noticeable and distracting. Impossible to hide from other people. More frequent flare-ups. Still possible to adapt and function close to normal. It can be very annoying and may have occasional stronger flare-ups. With discipline, patients may get used to it and adapt. Information on the proper use of the pain scale provided to the patient today. When using our objective Pain Scale, levels between 6 and 10/10 are said to belong in an emergency room, as it progressively worsens from a 6/10, described as severely limiting, requiring emergency care not usually available at an outpatient pain management facility. At a 6/10 level, communication becomes difficult and requires great effort. Assistance to reach the emergency department may be required. Facial flushing and profuse sweating along with potentially dangerous increases in heart rate and blood pressure will be evident. Effect on ADL: "pain slows me down, but I still have to work" Timing: Constant Modifying factors: medications BP: (!) 137/56  HR: 65  Anita Gibbs comes in today for a follow-up visit after her initial evaluation on 09/07/2018.  Today we went over the results of her tests. These were explained  in "Layman's terms". During today's appointment we went over my diagnostic impression, as well as the proposed treatment plan.  According to the patient her primary area of pain is in her neck.  She denies any precipitating factors.  She admits that she woke up one morning left-sided numbness and tingling was seen in the ER and 1 month later she underwent the surgery.  She is s/p ACDF October 2018 by Dr. Cari Caraway.  She denies any previous interventional therapy.  She denies any physical therapy.  She has had a recent CT scan along with cervical MRI.  She is currently describing her pain is sharp pain in the neck.  Indicated that she was sent to have an injection in her neck.  Her second area of pain is in her arms(B)(R>L).  She admits that currently the right side is worse than the left.  She has numbness tingling that goes down into the fingers.  She has had a nerve conduction study.  She was diagnosed with a right-sided carpal tunnel syndrome.  Denies any weakness.  Her third area of pain is in her lower back(B)(R>L).  She admits that the right side is greater than the left.  She denies any previous surgery, interventional therapy, physical therapy or recent images.  Her last area of pain is in her legs(B)(R>L).  She admits that the right is greater than the left.  She has numbness and tingling in her right leg that goes down into the buttocks down the back of her leg into the top of her foot (L5).  She does have occasional swelling in her right leg.  On the left side she does have some tingling.  LLEP, same as in right. She denies any weakness.  She denies any recent physical therapy.  In considering the treatment plan options, Ms. Belleau was reminded that I no longer take patients for medication management only. I asked her to let me know if she had no intention of taking advantage of the interventional therapies, so that we could  make arrangements to provide this space to someone interested. I also made it clear that undergoing interventional therapies for the purpose of getting pain medications is very inappropriate on the part of a patient, and it will not be tolerated in this practice. This type of behavior would suggest true addiction and therefore it requires referral to an addiction specialist.   Further details on both, my assessment(s), as well as the proposed treatment plan, please see below.  Controlled Substance Pharmacotherapy Assessment REMS (Risk Evaluation and Mitigation Strategy)  Analgesic: None Highest recorded MME/day: 60 mg/day MME/day: 0 mg/day  Pill Count: None expected due to no prior prescriptions written by our practice. Rise Patience, RN  09/27/2018 11:00 AM  Signed Safety precautions to be maintained throughout the outpatient stay will include: orient to surroundings, keep bed in low position, maintain call bell within reach at all times, provide assistance with transfer out of bed and ambulation.    Pharmacokinetics: Liberation and absorption (onset of action): WNL Distribution (time to peak effect): WNL Metabolism and excretion (duration of action): WNL         Pharmacodynamics: Desired effects: Analgesia: Ms. Frank reports >50% benefit. Functional ability: Patient reports that medication allows her to accomplish basic ADLs Clinically meaningful improvement in function (CMIF): Sustained CMIF goals met Perceived effectiveness: Described as relatively effective, allowing for increase in activities of daily living (ADL) Undesirable effects: Side-effects or Adverse reactions: None reported Monitoring: [place] PMP: Online review  of the past 53-monthperiod previously conducted. Not applicable at this point since we have not taken over the patient's medication management yet. List of other Serum/Urine Drug Screening Test(s):  No results found. List of all UDS test(s) done:  Lab Results   Component Value Date   SUMMARY FINAL 09/07/2018   Last UDS on record: Summary  Date Value Ref Range Status  09/07/2018 FINAL  Final    Comment:    ==================================================================== TOXASSURE COMP DRUG ANALYSIS,UR ==================================================================== Test                             Result       Flag       Units Drug Present and Declared for Prescription Verification   Oxycodone                      220          EXPECTED   ng/mg creat   Oxymorphone                    65           EXPECTED   ng/mg creat   Noroxycodone                   363          EXPECTED   ng/mg creat    Sources of oxycodone include scheduled prescription medications.    Oxymorphone and noroxycodone are expected metabolites of    oxycodone. Oxymorphone is also available as a scheduled    prescription medication.   Gabapentin                     PRESENT      EXPECTED   Methocarbamol                  PRESENT      EXPECTED   Diphenhydramine                PRESENT      EXPECTED Drug Absent but Declared for Prescription Verification   Acetaminophen                  Not Detected UNEXPECTED    Acetaminophen, as indicated in the declared medication list, is    not always detected even when used as directed.   Salicylate                     Not Detected UNEXPECTED    Aspirin, as indicated in the declared medication list, is not    always detected even when used as directed.   Naproxen                       Not Detected UNEXPECTED ==================================================================== Test                      Result    Flag   Units      Ref Range   Creatinine              207              mg/dL      >=20 ==================================================================== Declared Medications:  The flagging and interpretation on this report are based on the  following declared medications.  Unexpected results may arise from  inaccuracies  in  the declared medications.  **Note: The testing scope of this panel includes these medications:  Diphenhydramine (Benadryl)  Gabapentin  Methocarbamol (Robaxin)  Naproxen (Naprosyn)  Oxycodone  **Note: The testing scope of this panel does not include small to  moderate amounts of these reported medications:  Acetaminophen (Excedrin)  Aspirin (Excedrin)  **Note: The testing scope of this panel does not include following  reported medications:  Albuterol  Caffeine (Excedrin)  Epinephrine (EpiPen)  Famotidine (Pepcid)  Losartan (Cozaar)  Metronidazole (Flagyl) ==================================================================== For clinical consultation, please call [phone]. ====================================================================    UDS interpretation: No unexpected findings.          Medication Assessment Form: Patient introduced to form today Treatment compliance: Treatment may start today if patient agrees with proposed plan. Evaluation of compliance is not applicable at this point Risk Assessment Profile: Aberrant behavior: See initial evaluations. None observed or detected today Comorbid factors increasing risk of overdose: See initial evaluation. No additional risks detected today Opioid risk tool (ORT) (Total Score): 0 Personal History of Substance Abuse (SUD-Substance use disorder):  Alcohol: Negative  Illegal Drugs: Negative  Rx Drugs: Negative  ORT Risk Level calculation: Low Risk Risk of substance use disorder (SUD): Low Opioid Risk Tool - 09/27/18 1059      Family History of Substance Abuse   Alcohol  Negative    Illegal Drugs  Negative    Rx Drugs  Negative      Personal History of Substance Abuse   Alcohol  Negative    Illegal Drugs  Negative    Rx Drugs  Negative      Age   Age between 30-45 years   No      History of Preadolescent Sexual Abuse   History of Preadolescent Sexual Abuse  Negative or Female      Psychological Disease    Psychological Disease  Negative    Depression  Negative      Total Score   Opioid Risk Tool Scoring  0    Opioid Risk Interpretation  Low Risk      ORT Scoring interpretation table:  Score <3 = Low Risk for SUD  Score between 4-7 = Moderate Risk for SUD  Score >8 = High Risk for Opioid Abuse   Risk Mitigation Strategies:  Patient opioid safety counseling: Completed today. Counseling provided to patient as per "Patient Counseling Document". Document signed by patient, attesting to counseling and understanding Patient-Prescriber Agreement (PPA): Obtained today.  Controlled substance notification to other providers: Written and sent today.  Pharmacologic Plan: Today we may be taking over the patient's pharmacological regimen. See below.             Laboratory Chemistry  Inflammation Markers (CRP: Acute Phase) (ESR: Chronic Phase) Lab Results  Component Value Date   CRP 7 09/07/2018   ESRSEDRATE 67 (H) 09/07/2018                         Rheumatology Markers Lab Results  Component Value Date   LABURIC 6.0 05/31/2018                        Renal Function Markers Lab Results  Component Value Date   BUN 11 09/23/2018   CREATININE 0.89 09/23/2018   BCR 13 09/07/2018   GFRAA 76 09/07/2018   GFRNONAA 66 09/07/2018  Hepatic Function Markers Lab Results  Component Value Date   AST 10 09/07/2018   ALT 10 08/03/2018   ALBUMIN 4.3 09/07/2018   ALKPHOS 42 09/07/2018                        Electrolytes Lab Results  Component Value Date   NA 142 09/23/2018   K 3.7 09/23/2018   CL 108 09/23/2018   CALCIUM 9.1 09/23/2018   MG 2.2 09/07/2018                        Neuropathy Markers Lab Results  Component Value Date   VITAMINB12 601 09/07/2018   HGBA1C 5.5 08/03/2018   HIV NON-REACTIVE 09/06/2018                        CNS Tests No results found.  Bone Pathology Markers Lab Results  Component Value Date   25OHVITD1 8.1 (L)  09/07/2018   25OHVITD2 <1.0 09/07/2018   25OHVITD3 8.1 09/07/2018                         Coagulation Parameters Lab Results  Component Value Date   INR 1.02 06/25/2017   LABPROT 13.3 06/25/2017   APTT 30 06/25/2017   PLT 286.0 08/03/2018                        Cardiovascular Markers Lab Results  Component Value Date   TROPONINI <0.03 06/03/2017   HGB 12.2 08/03/2018   HCT 37.1 08/03/2018                         CA Markers No results found.  Note: Lab results reviewed.  Recent Diagnostic Imaging Review  Cervical Imaging: Cervical MR wo contrast:  Results for orders placed during the hospital encounter of 06/10/18  MR CERVICAL SPINE WO CONTRAST   Narrative CLINICAL DATA:  Cervical myelopathy. Posterior and right lateral neck pain. Left leg numbness. Bilateral arm and hand numbness.  EXAM: MRI CERVICAL SPINE WITHOUT CONTRAST  TECHNIQUE: Multiplanar, multisequence MR imaging of the cervical spine was performed. No intravenous contrast was administered.  COMPARISON:  None.  FINDINGS: Alignment: AP alignment is anatomic.  Vertebrae: Marrow signal is obscured by fusion hardware at C4, C5, C6, and C7.  Cord: Normal signal is present in the cervical and upper thoracic spinal cord to the lowest imaged level T3-4.  Posterior Fossa, vertebral arteries, paraspinal tissues: A relatively empty sella is present. Intracranial contents are otherwise normal. Flow is present in the basilar artery. The craniocervical junction is otherwise normal.  Disc levels:  C2-3: Negative.  C3-4: Negative. A mild broad-based disc osteophyte complex present. Uncovertebral spurring contributes to mild foraminal narrowing bilaterally.  C4-5: A leftward disc osteophyte complex is present. Uncovertebral and facet disease contribute to mild left foraminal narrowing. There is partial effacement of ventral CSF.  C5-6: Anterior fusion is present. Mild left foraminal narrowing is due to  residual uncovertebral disease.  C6-7: Anterior fusion is present. Mild left foraminal narrowing is due to residual uncovertebral disease.  C7-T1: Minimal left-sided uncovertebral spurring is present without significant stenosis.  IMPRESSION: 1. Anterior fusion at C4-5, C5-6, and C6-7. 2. Mild residual central canal narrowing at C4-5. 3. Mild left foraminal narrowing at each of the fused levels. 4. Mild foraminal narrowing bilaterally at C3-4, adjacent  to the fusion.   Electronically Signed   By: San Morelle M.D.   On: 06/10/2018 12:00    Cervical CT wo contrast:  Results for orders placed during the hospital encounter of 06/10/18  CT CERVICAL SPINE WO CONTRAST   Narrative CLINICAL DATA:  Left-sided numbness since ACDF 1 year ago.  EXAM: CT CERVICAL SPINE WITHOUT CONTRAST  TECHNIQUE: Multidetector CT imaging of the cervical spine was performed without intravenous contrast. Multiplanar CT image reconstructions were also generated.  COMPARISON:  MRI cervical spine from same day. Cervical spine x-rays dated July 01, 2017.  FINDINGS: Alignment: Normal.  Skull base and vertebrae: Prior C4-C7 ACDF with solid fusion. The right C7 screw and plate are slightly offset from the vertebral body. No acute fracture. No primary bone lesion or focal pathologic process.  Soft tissues and spinal canal: Normal prevertebral soft tissues.  Disc levels:  C2-C3:  Negative.  C3-C4: Tiny central disc protrusion. Mild bilateral uncovertebral hypertrophy with mild bilateral neuroforaminal stenosis.  C4-C5: Prior ACDF. Mild left neuroforaminal stenosis due to facet uncovertebral hypertrophy.  C5-C6: Prior ACDF. Mild bilateral facet uncovertebral hypertrophy. Mild left neuroforaminal stenosis.  C6-C7: Prior ACDF. Mild left facet uncovertebral hypertrophy. Mild left neuroforaminal stenosis.  C7-T1:  Negative.  Upper chest: Negative.  Other: None.  IMPRESSION: 1.  Prior C4-C7 ACDF with solid fusion. The right C7 screw and plate are slightly offset from the vertebral body. 2. Mild residual left-sided neuroforaminal stenosis at the fused levels. 3. Mild bilateral neuroforaminal stenosis at C3-C4.   Electronically Signed   By: Titus Dubin M.D.   On: 06/10/2018 13:46    Cervical DG 2-3 views:  Results for orders placed during the hospital encounter of 07/01/17  DG Cervical Spine 2 or 3 views   Narrative CLINICAL DATA:  Status post cervical spine fusion.  EXAM: CERVICAL SPINE - 2-3 VIEW  COMPARISON:  None  FINDINGS: Anterior C4 through C7 fusion with interbody spacers. C7-T1 alignment is not well evaluated due to overlying osseous and soft tissue structures. Alignment otherwise maintained. Recent postsurgical change with air in the prevertebral soft tissues.  IMPRESSION: Post anterior C4 through C7 fusion with interbody spacers. No immediate postoperative complication.   Electronically Signed   By: Jeb Levering M.D.   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  EXAM: CERVICAL SPINE - 2-3 VIEW; DG C-ARM 61-120 MIN  COMPARISON:  None.  FINDINGS: Multiple C-arm images demonstrate the patient has undergone anterior cervical fusion at C4-5, C5-6, and C6-7. Alignment appears anatomic.  IMPRESSION: Anterior cervical fusion performed from C4-5 through C6-7.  FLUOROSCOPY TIME:  8 seconds.  C-arm fluoroscopic images were obtained intraoperatively and submitted for post operative interpretation.   Electronically Signed   By: Lorriane Shire M.D.   On: 07/01/2017 17:30    Lumbosacral Imaging: Lumbar DG Bending views:  Results for orders placed during the hospital encounter of 09/07/18  DG Lumbar Spine Complete W/Bend   Narrative CLINICAL DATA:  48 year old female chronic low back pain. No injury.  EXAM: LUMBAR SPINE -  COMPLETE WITH BENDING VIEWS  COMPARISON:  None.  FINDINGS: Slight curvature lumbar spine convex left. Normal mineralization. No compression fracture or pars defect.  No significant lumbar disc space narrowing. No abnormal motion between flexion and extension.  Minimal anterior spur T11-12 and L3-4 level.  IMPRESSION: 1. Slight curvature lumbar spine convex left. 2. No compression fracture  or pars defect noted. 3. No significant lumbar disc space narrowing. 4. No abnormal motion between flexion and extension.   Electronically Signed   By: Genia Del M.D.   On: 09/07/2018 15:31    Sacroiliac Joint Imaging: Sacroiliac Joint DG:  Results for orders placed during the hospital encounter of 09/07/18  DG Si Joints   Narrative CLINICAL DATA:  48 year old female with chronic lumbar pain. No injury. Initial encounter.  EXAM: BILATERAL SACROILIAC JOINTS - 3+ VIEW  COMPARISON:  Lumbar spine films same date.  FINDINGS: Mild sclerosis surrounds the sacroiliac joints bilaterally. No associated erosions. Otherwise negative.  IMPRESSION: Mild sclerosis surrounds the sacroiliac joints bilaterally. No associated erosions.   Electronically Signed   By: Genia Del M.D.   On: 09/07/2018 15:32    Foot Imaging: Foot-L DG Complete:  Results for orders placed in visit on 05/31/18  DG Foot Complete Left   Narrative CLINICAL DATA:  Pain and numbness  EXAM: LEFT FOOT - COMPLETE 3+ VIEW  COMPARISON:  None.  FINDINGS: Frontal, oblique, and lateral views obtained. There is generalized soft tissue swelling. There is no appreciable fracture or dislocation. Joint spaces appear normal. There is spurring in the dorsal midfoot. There are spurs arising from the posterior and inferior calcaneus.  IMPRESSION: Spurring dorsal midfoot. Calcaneal spurs present. No appreciable joint space narrowing or erosion. No fracture or dislocation. There is generalized soft tissue  swelling.   Electronically Signed   By: Lowella Grip III M.D.   On: 05/31/2018 10:16    Complexity Note: Imaging results reviewed. Results shared with Ms. Rademaker, using Layman's terms.                         Meds   Current Outpatient Medications:  .  albuterol (PROVENTIL HFA;VENTOLIN HFA) 108 (90 Base) MCG/ACT inhaler, Inhale 2 puffs into the lungs every 6 (six) hours as needed for wheezing or shortness of breath., Disp: , Rfl:  .  amLODipine (NORVASC) 5 MG tablet, Take 1 tablet (5 mg total) by mouth daily., Disp: 90 tablet, Rfl: 1 .  aspirin-acetaminophen-caffeine (EXCEDRIN MIGRAINE) 250-250-65 MG tablet, Take 2 tablets by mouth every 6 (six) hours as needed for headache., Disp: , Rfl:  .  diphenhydrAMINE (BENADRYL) 25 mg capsule, Take 25 mg by mouth every 6 (six) hours as needed (for allergies.)., Disp: , Rfl:  .  EPINEPHrine (AUVI-Q) 0.3 mg/0.3 mL IJ SOAJ injection, Use as directed for severe allergic reaction, Disp: 2 Device, Rfl: 2 .  famotidine (PEPCID) 20 MG tablet, Take 1 tablet (20 mg total) by mouth 2 (two) times daily., Disp: 60 tablet, Rfl: 2 .  gabapentin (NEURONTIN) 300 MG capsule, Take 300 mg by mouth 3 (three) times daily., Disp: , Rfl:  .  losartan (COZAAR) 100 MG tablet, Take 1 tablet (100 mg total) by mouth daily., Disp: 90 tablet, Rfl: 3 .  methocarbamol (ROBAXIN) 500 MG tablet, Take 1 tablet (500 mg total) by mouth every 6 (six) hours as needed for muscle spasms., Disp: 90 tablet, Rfl: 0 .  metroNIDAZOLE (FLAGYL) 500 MG tablet, Take 1 tablet (500 mg total) by mouth 2 (two) times daily., Disp: 14 tablet, Rfl: 0 .  oxyCODONE (OXY IR/ROXICODONE) 5 MG immediate release tablet, Take 1 tablet (5 mg total) by mouth every 3 (three) hours as needed for moderate pain ((score 4 to 6))., Disp: 30 tablet, Rfl: 0 .  calcium carbonate (CALCIUM 600) 600 MG TABS tablet, Take 1 tablet (  600 mg total) by mouth 2 (two) times daily with a meal for 30 days., Disp: 60 tablet, Rfl: 0 .   Cholecalciferol (VITAMIN D3) 125 MCG (5000 UT) CAPS, Take 1 capsule (5,000 Units total) by mouth daily with breakfast. Take along with calcium and magnesium., Disp: 30 capsule, Rfl: 5 .  ergocalciferol (VITAMIN D2) 1.25 MG (50000 UT) capsule, Take 1 capsule (50,000 Units total) by mouth 2 (two) times a week. X 6 weeks., Disp: 12 capsule, Rfl: 0 .  Magnesium 500 MG CAPS, Take 1 capsule (500 mg total) by mouth 2 (two) times daily at 8 am and 10 pm., Disp: 60 capsule, Rfl: 5  ROS  Constitutional: Denies any fever or chills Gastrointestinal: No reported hemesis, hematochezia, vomiting, or acute GI distress Musculoskeletal: Denies any acute onset joint swelling, redness, loss of ROM, or weakness Neurological: No reported episodes of acute onset apraxia, aphasia, dysarthria, agnosia, amnesia, paralysis, loss of coordination, or loss of consciousness  Allergies  Ms. Guerry is allergic to other; shellfish allergy; strawberry (diagnostic); and tomato.  PFSH  Drug: Ms. Feigenbaum  reports no history of drug use. Alcohol:  reports current alcohol use. Tobacco:  reports that she has never smoked. She has never used smokeless tobacco. Medical:  has a past medical history of Arthritis, Carpal boss, right, Carpal tunnel syndrome, bilateral, Headache, and Hypertension. Surgical: Ms. Stringfield  has a past surgical history that includes Knee arthroscopy (Right) and Anterior cervical decomp/discectomy fusion (N/A, 07/01/2017). Family: family history includes [name] in her mother; Arthritis in her father, mother, and sister; Breast cancer in her maternal aunt and paternal aunt; COPD in her brother and son; Cancer in her brother; Depression in her son; Diabetes in her mother; Early death in her brother; Hearing loss in her father; Hyperlipidemia in her mother; Hypertension in her father, mother, and sister; Kidney disease in her father.  Constitutional Exam  General appearance: Well nourished, well developed, and  well hydrated. In no apparent acute distress Vitals:   09/27/18 1051  BP: (!) 137/56  Pulse: 65  Resp: 16  Temp: 98.3 F (36.8 C)  TempSrc: Oral  SpO2: 100%  Weight: 222 lb (100.7 kg)  Height: '5\' 3"'  (1.6 m)   BMI Assessment: Estimated body mass index is 39.33 kg/m as calculated from the following:   Height as of this encounter: '5\' 3"'  (1.6 m).   Weight as of this encounter: 222 lb (100.7 kg).  BMI interpretation table: BMI level Category Range association with higher incidence of chronic pain  <18 kg/m2 Underweight   18.5-24.9 kg/m2 Ideal body weight   25-29.9 kg/m2 Overweight Increased incidence by 20%  30-34.9 kg/m2 Obese (Class I) Increased incidence by 68%  35-39.9 kg/m2 Severe obesity (Class II) Increased incidence by 136%  >40 kg/m2 Extreme obesity (Class III) Increased incidence by 254%   Patient's current BMI Ideal Body weight  Body mass index is 39.33 kg/m. Ideal body weight: 52.4 kg (115 lb 8.3 oz) Adjusted ideal body weight: 71.7 kg (158 lb 1.8 oz)   BMI Readings from Last 4 Encounters:  09/28/18 39.33 kg/m  09/27/18 39.33 kg/m  09/07/18 40.03 kg/m  09/06/18 40.21 kg/m   Wt Readings from Last 4 Encounters:  09/28/18 222 lb (100.7 kg)  09/27/18 222 lb (100.7 kg)  09/07/18 226 lb (102.5 kg)  09/06/18 227 lb (103 kg)  Psych/Mental status: Alert, oriented x 3 (person, place, & time)       Eyes: PERLA Respiratory: No evidence of acute respiratory distress  Cervical Spine Area Exam  Skin & Axial Inspection: No masses, redness, edema, swelling, or associated skin lesions Alignment: Symmetrical Functional ROM: Unrestricted ROM      Stability: No instability detected Muscle Tone/Strength: Functionally intact. No obvious neuro-muscular anomalies detected. Sensory (Neurological): Unimpaired Palpation: No palpable anomalies              Upper Extremity (UE) Exam    Side: Right upper extremity  Side: Left upper extremity  Skin & Extremity Inspection: Skin  color, temperature, and hair growth are WNL. No peripheral edema or cyanosis. No masses, redness, swelling, asymmetry, or associated skin lesions. No contractures.  Skin & Extremity Inspection: Skin color, temperature, and hair growth are WNL. No peripheral edema or cyanosis. No masses, redness, swelling, asymmetry, or associated skin lesions. No contractures.  Functional ROM: Unrestricted ROM          Functional ROM: Unrestricted ROM          Muscle Tone/Strength: Functionally intact. No obvious neuro-muscular anomalies detected.  Muscle Tone/Strength: Functionally intact. No obvious neuro-muscular anomalies detected.  Sensory (Neurological): Unimpaired          Sensory (Neurological): Unimpaired          Palpation: No palpable anomalies              Palpation: No palpable anomalies              Provocative Test(s):  Phalen's test: deferred Tinel's test: deferred Apley's scratch test (touch opposite shoulder):  Action 1 (Across chest): deferred Action 2 (Overhead): deferred Action 3 (LB reach): deferred   Provocative Test(s):  Phalen's test: deferred Tinel's test: deferred Apley's scratch test (touch opposite shoulder):  Action 1 (Across chest): deferred Action 2 (Overhead): deferred Action 3 (LB reach): deferred    Thoracic Spine Area Exam  Skin & Axial Inspection: No masses, redness, or swelling Alignment: Symmetrical Functional ROM: Unrestricted ROM Stability: No instability detected Muscle Tone/Strength: Functionally intact. No obvious neuro-muscular anomalies detected. Sensory (Neurological): Unimpaired Muscle strength & Tone: No palpable anomalies  Lumbar Spine Area Exam  Skin & Axial Inspection: No masses, redness, or swelling Alignment: Symmetrical Functional ROM: Unrestricted ROM       Stability: No instability detected Muscle Tone/Strength: Functionally intact. No obvious neuro-muscular anomalies detected. Sensory (Neurological): Unimpaired Palpation: No palpable  anomalies       Provocative Tests: Hyperextension/rotation test: deferred today       Lumbar quadrant test (Kemp's test): deferred today       Lateral bending test: deferred today       Patrick's Maneuver: deferred today                   FABER* test: deferred today                   S-I anterior distraction/compression test: deferred today         S-I lateral compression test: deferred today         S-I Thigh-thrust test: deferred today         S-I Gaenslen's test: deferred today         *(Flexion, ABduction and External Rotation)  Gait & Posture Assessment  Ambulation: Unassisted Gait: Relatively normal for age and body habitus Posture: WNL   Lower Extremity Exam    Side: Right lower extremity  Side: Left lower extremity  Stability: No instability observed          Stability: No instability observed  Skin & Extremity Inspection: Skin color, temperature, and hair growth are WNL. No peripheral edema or cyanosis. No masses, redness, swelling, asymmetry, or associated skin lesions. No contractures.  Skin & Extremity Inspection: Skin color, temperature, and hair growth are WNL. No peripheral edema or cyanosis. No masses, redness, swelling, asymmetry, or associated skin lesions. No contractures.  Functional ROM: Unrestricted ROM                  Functional ROM: Unrestricted ROM                  Muscle Tone/Strength: Functionally intact. No obvious neuro-muscular anomalies detected.  Muscle Tone/Strength: Functionally intact. No obvious neuro-muscular anomalies detected.  Sensory (Neurological): Unimpaired        Sensory (Neurological): Unimpaired        DTR: Patellar: deferred today Achilles: deferred today Plantar: deferred today  DTR: Patellar: deferred today Achilles: deferred today Plantar: deferred today  Palpation: No palpable anomalies  Palpation: No palpable anomalies   Assessment & Plan  Primary Diagnosis & Pertinent Problem List: The primary encounter diagnosis was  Chronic pain syndrome. 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-5, C5-6, and C6-7), Cervical myelopathy (HCC), Chronic upper extremity pain (Secondary Area of Pain) (Bilateral) (R>L), DDD (degenerative disc disease), cervical, Spondylosis, cervical, w/ myelopathy, Chronic low back pain (Tertiary Area of Pain) (Bilateral) (R>L) w/ sciatica (Bilateral), DDD (degenerative disc disease), lumbar, Chronic sacroiliac joint dysfunction (Bilateral), Osteoarthritis of sacroiliac joint (Bilateral), Somatic dysfunction of sacroiliac joint (Bilateral), Chronic sacroiliac joint pain (Right), Chronic lower extremity pain (Fourth Area of Pain) (Bilateral) (R>L), Numbness and tingling of both feet, Disorder of skeletal system, Elevated sed rate, Pharmacologic therapy, and Vitamin D deficiency were also pertinent to this visit.  Visit Diagnosis: 1. Chronic pain syndrome   2. Chronic neck pain (Primary Area of Pain) (Bilateral) (L>R)   3. Chronic neck pain (Bilateral) w/ history of cervical spinal surgery   4. Cervicalgia (Primary Area of Pain) (Bilateral) (L>R)   5. Cervical facet hypertrophy (C3-T1)   6. Cervical facet joint syndrome (Bilateral) (L>R)   7. Cervical spondylosis   8. History of fusion of cervical spine (ACDF C4-C7)   9. Abnormal MRI, cervical spine (06/10/2018)   10. 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)   11. 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 Cervical myelopathy ([place])   13. Chronic upper extremity pain  (Secondary Area of Pain) (Bilateral) (R>L)   14. DDD (degenerative disc disease), cervical   15. Spondylosis, cervical, w/ myelopathy   16. Chronic low back pain [facility] Area of Pain) (Bilateral) (R>L) w/ sciatica (Bilateral)   17. DDD (degenerative disc disease), lumbar   18. Chronic sacroiliac joint dysfunction (Bilateral)   19. Osteoarthritis of sacroiliac joint (Bilateral)   20. Somatic dysfunction of sacroiliac joint (Bilateral)   21. Chronic sacroiliac joint pain (Right)   22. Chronic lower extremity pain (Fourth Area of Pain) (Bilateral) (R>L)   23. Numbness and tingling of both feet   24. Disorder of skeletal system   25. Elevated sed rate   26. Pharmacologic therapy   27. Vitamin D deficiency  Problems updated and reviewed during this visit: No problems updated.  Plan of Care  Pharmacotherapy (Medications Ordered): Meds ordered this encounter  Medications  . ergocalciferol (VITAMIN D2) 1.25 MG (50000 UT) capsule    Sig: Take 1 capsule (50,000 Units total) by mouth 2 (two) times a week. X 6 weeks.    Dispense:  12 capsule    Refill:  0    Do not add this medication to the electronic "Automatic Refill" notification system. Patient may have prescription filled one day early if pharmacy is closed on scheduled refill date.  . Cholecalciferol (VITAMIN D3) 125 MCG (5000 UT) CAPS    Sig: Take 1 capsule (5,000 Units total) by mouth daily with breakfast. Take along with calcium and magnesium.    Dispense:  30 capsule    Refill:  5    Do not place medication on "Automatic Refill".  May substitute with similar over-the-counter product.  . Magnesium 500 MG CAPS    Sig: Take 1 capsule (500 mg total) by mouth 2 (two) times daily at 8 am and 10 pm.    Dispense:  60 capsule    Refill:  5    Do not place medication on "Automatic Refill".  The patient may use similar over-the-counter product.  . calcium carbonate (CALCIUM 600) 600 MG TABS tablet    Sig: Take 1 tablet (600 mg total)  by mouth 2 (two) times daily with a meal for 30 days.    Dispense:  60 tablet    Refill:  0    Do not place medication on "Automatic Refill". Fill one day early if pharmacy is closed on scheduled refill date.    Procedure Orders     Cervical Epidural Injection Lab Orders  No laboratory test(s) ordered today   Imaging Orders  No imaging studies ordered today   Referral Orders  No referral(s) requested today    Pharmacological management options:  Opioid Analgesics: We'll take over management today. See above orders Membrane stabilizer: We have discussed the possibility of optimizing this mode of therapy, if tolerated Muscle relaxant: We have discussed the possibility of a trial NSAID: We have discussed the possibility of a trial Other analgesic(s): To be determined at a later time   Interventional management options: Planned, scheduled, and/or pending:    Diagnostic right-sided cervical epidural steroid injection #1 under fluoroscopic guidance and IV sedation    Considering:   Diagnostic midline CESI  Diagnostic bilateral cervical facet nerve block  Possible bilateral cervical facet RFA  Diagnostic midline lumbar ESI  Diagnostic bilateral lumbar facet nerve block  Possible bilateral lumbar facet RFA  Diagnostic right-sided sacroiliac joint injection  Possible right-sided sacroiliac joint RFA    PRN Procedures:   None at this time   Provider-requested follow-up: Return for Procedure (w/ sedation): (R) CESI #1.  Future Appointments  Date Time Provider [place]  10/07/2018  9:00 AM [facility] NURSE [facility]  10/12/2018 11:30 AM Kozlow, Donnamarie Poag, MD [facility] None  10/13/2018 11:15 AM Milinda Pointer, MD [facility] None  11/29/2018  9:00 AM Guse, Jacquelynn Cree, FNP [facility]    Primary Care Physician: Jodelle Green, FNP Location: [facility] Note by: Gaspar Cola, MD Date: 09/27/2018; Time: 12:05 PM

## 2018-09-23 NOTE — Progress Notes (Signed)
I called LM for patient to call back. BP check needs to be scheduled 2-3 weeks & patient needs to be notified amlodipine was sent in to start.

## 2018-09-23 NOTE — Addendum Note (Signed)
Addended by: Philis Nettle on: 09/23/2018 09:26 AM   Modules accepted: Orders

## 2018-09-27 ENCOUNTER — Ambulatory Visit: Payer: BC Managed Care – PPO | Attending: Pain Medicine | Admitting: Pain Medicine

## 2018-09-27 ENCOUNTER — Encounter: Payer: Self-pay | Admitting: Pain Medicine

## 2018-09-27 ENCOUNTER — Other Ambulatory Visit: Payer: Self-pay

## 2018-09-27 VITALS — BP 137/56 | HR 65 | Temp 98.3°F | Resp 16 | Ht 63.0 in | Wt 222.0 lb

## 2018-09-27 DIAGNOSIS — Z79899 Other long term (current) drug therapy: Secondary | ICD-10-CM | POA: Diagnosis present

## 2018-09-27 DIAGNOSIS — M9904 Segmental and somatic dysfunction of sacral region: Secondary | ICD-10-CM | POA: Insufficient documentation

## 2018-09-27 DIAGNOSIS — M5442 Lumbago with sciatica, left side: Secondary | ICD-10-CM | POA: Insufficient documentation

## 2018-09-27 DIAGNOSIS — M461 Sacroiliitis, not elsewhere classified: Secondary | ICD-10-CM

## 2018-09-27 DIAGNOSIS — E559 Vitamin D deficiency, unspecified: Secondary | ICD-10-CM | POA: Insufficient documentation

## 2018-09-27 DIAGNOSIS — G8929 Other chronic pain: Secondary | ICD-10-CM | POA: Diagnosis present

## 2018-09-27 DIAGNOSIS — M79605 Pain in left leg: Secondary | ICD-10-CM | POA: Insufficient documentation

## 2018-09-27 DIAGNOSIS — M5030 Other cervical disc degeneration, unspecified cervical region: Secondary | ICD-10-CM | POA: Insufficient documentation

## 2018-09-27 DIAGNOSIS — G959 Disease of spinal cord, unspecified: Secondary | ICD-10-CM | POA: Diagnosis present

## 2018-09-27 DIAGNOSIS — M5136 Other intervertebral disc degeneration, lumbar region: Secondary | ICD-10-CM | POA: Insufficient documentation

## 2018-09-27 DIAGNOSIS — R200 Anesthesia of skin: Secondary | ICD-10-CM

## 2018-09-27 DIAGNOSIS — M47812 Spondylosis without myelopathy or radiculopathy, cervical region: Secondary | ICD-10-CM

## 2018-09-27 DIAGNOSIS — M533 Sacrococcygeal disorders, not elsewhere classified: Secondary | ICD-10-CM | POA: Insufficient documentation

## 2018-09-27 DIAGNOSIS — M79604 Pain in right leg: Secondary | ICD-10-CM | POA: Diagnosis present

## 2018-09-27 DIAGNOSIS — M4802 Spinal stenosis, cervical region: Secondary | ICD-10-CM | POA: Insufficient documentation

## 2018-09-27 DIAGNOSIS — M542 Cervicalgia: Secondary | ICD-10-CM

## 2018-09-27 DIAGNOSIS — G8928 Other chronic postprocedural pain: Secondary | ICD-10-CM

## 2018-09-27 DIAGNOSIS — R700 Elevated erythrocyte sedimentation rate: Secondary | ICD-10-CM | POA: Diagnosis present

## 2018-09-27 DIAGNOSIS — R937 Abnormal findings on diagnostic imaging of other parts of musculoskeletal system: Secondary | ICD-10-CM | POA: Insufficient documentation

## 2018-09-27 DIAGNOSIS — M79602 Pain in left arm: Secondary | ICD-10-CM

## 2018-09-27 DIAGNOSIS — M5441 Lumbago with sciatica, right side: Secondary | ICD-10-CM | POA: Insufficient documentation

## 2018-09-27 DIAGNOSIS — M47818 Spondylosis without myelopathy or radiculopathy, sacral and sacrococcygeal region: Secondary | ICD-10-CM | POA: Diagnosis present

## 2018-09-27 DIAGNOSIS — Z9889 Other specified postprocedural states: Secondary | ICD-10-CM

## 2018-09-27 DIAGNOSIS — Z981 Arthrodesis status: Secondary | ICD-10-CM | POA: Insufficient documentation

## 2018-09-27 DIAGNOSIS — M899 Disorder of bone, unspecified: Secondary | ICD-10-CM | POA: Insufficient documentation

## 2018-09-27 DIAGNOSIS — M4712 Other spondylosis with myelopathy, cervical region: Secondary | ICD-10-CM | POA: Diagnosis present

## 2018-09-27 DIAGNOSIS — G894 Chronic pain syndrome: Secondary | ICD-10-CM | POA: Insufficient documentation

## 2018-09-27 DIAGNOSIS — R202 Paresthesia of skin: Secondary | ICD-10-CM | POA: Insufficient documentation

## 2018-09-27 DIAGNOSIS — M79601 Pain in right arm: Secondary | ICD-10-CM | POA: Diagnosis present

## 2018-09-27 HISTORY — DX: Other spondylosis with myelopathy, cervical region: M47.12

## 2018-09-27 HISTORY — DX: Cervicalgia: M54.2

## 2018-09-27 HISTORY — DX: Sacrococcygeal disorders, not elsewhere classified: M53.3

## 2018-09-27 HISTORY — DX: Segmental and somatic dysfunction of sacral region: M99.04

## 2018-09-27 HISTORY — DX: Other chronic pain: G89.29

## 2018-09-27 HISTORY — DX: Sacroiliitis, not elsewhere classified: M46.1

## 2018-09-27 HISTORY — DX: Spinal stenosis, cervical region: M48.02

## 2018-09-27 HISTORY — DX: Other chronic postprocedural pain: G89.28

## 2018-09-27 HISTORY — DX: Spondylosis without myelopathy or radiculopathy, cervical region: M47.812

## 2018-09-27 HISTORY — DX: Abnormal findings on diagnostic imaging of other parts of musculoskeletal system: R93.7

## 2018-09-27 HISTORY — DX: Arthrodesis status: Z98.1

## 2018-09-27 MED ORDER — VITAMIN D3 125 MCG (5000 UT) PO CAPS: 1.0000 | ORAL_CAPSULE | Freq: Every day | ORAL | 5 refills | Status: AC

## 2018-09-27 MED ORDER — MAGNESIUM 500 MG PO CAPS: 500.0000 mg | ORAL_CAPSULE | Freq: Two times a day (BID) | ORAL | 5 refills | Status: AC

## 2018-09-27 MED ORDER — CALCIUM CARBONATE 600 MG PO TABS: 600.0000 mg | ORAL_TABLET | Freq: Two times a day (BID) | ORAL | 0 refills | Status: DC

## 2018-09-27 MED ORDER — ERGOCALCIFEROL 1.25 MG (50000 UT) PO CAPS: 50000.0000 [IU] | ORAL_CAPSULE | ORAL | 0 refills | Status: AC

## 2018-09-27 NOTE — Progress Notes (Signed)
Safety precautions to be maintained throughout the outpatient stay will include: orient to surroundings, keep bed in low position, maintain call bell within reach at all times, provide assistance with transfer out of bed and ambulation.  

## 2018-09-27 NOTE — Patient Instructions (Addendum)
____________________________________________________________________________________________  Preparing for Procedure with Sedation  Instructions: . Oral Intake: Do not eat or drink anything for at least 8 hours prior to your procedure. . Transportation: Public transportation is not allowed. Bring an adult driver. The driver must be physically present in our waiting room before any procedure can be started. . Physical Assistance: Bring an adult physically capable of assisting you, in the event you need help. This adult should keep you company at home for at least 6 hours after the procedure. . Blood Pressure Medicine: Take your blood pressure medicine with a sip of water the morning of the procedure. . Blood thinners: Notify our staff if you are taking any blood thinners. Depending on which one you take, there will be specific instructions on how and when to stop it. . Diabetics on insulin: Notify the staff so that you can be scheduled 1st case in the morning. If your diabetes requires high dose insulin, take only  of your normal insulin dose the morning of the procedure and notify the staff that you have done so. . Preventing infections: Shower with an antibacterial soap the morning of your procedure. . Build-up your immune system: Take 1000 mg of Vitamin C with every meal (3 times a day) the day prior to your procedure. . Antibiotics: Inform the staff if you have a condition or reason that requires you to take antibiotics before dental procedures. . Pregnancy: If you are pregnant, call and cancel the procedure. . Sickness: If you have a cold, fever, or any active infections, call and cancel the procedure. . Arrival: You must be in the facility at least 30 minutes prior to your scheduled procedure. . Children: Do not bring children with you. . Dress appropriately: Bring dark clothing that you would not mind if they get stained. . Valuables: Do not bring any jewelry or valuables.  Procedure  appointments are reserved for interventional treatments only. . No Prescription Refills. . No medication changes will be discussed during procedure appointments. . No disability issues will be discussed.  Reasons to call and reschedule or cancel your procedure: (Following these recommendations will minimize the risk of a serious complication.) . Surgeries: Avoid having procedures within 2 weeks of any surgery. (Avoid for 2 weeks before or after any surgery). . Flu Shots: Avoid having procedures within 2 weeks of a flu shots or . (Avoid for 2 weeks before or after immunizations). . Barium: Avoid having a procedure within 7-10 days after having had a radiological study involving the use of radiological contrast. (Myelograms, Barium swallow or enema study). . Heart attacks: Avoid any elective procedures or surgeries for the initial 6 months after a "Myocardial Infarction" (Heart Attack). . Blood thinners: It is imperative that you stop these medications before procedures. Let us know if you if you take any blood thinner.  . Infection: Avoid procedures during or within two weeks of an infection (including chest colds or gastrointestinal problems). Symptoms associated with infections include: Localized redness, fever, chills, night sweats or profuse sweating, burning sensation when voiding, cough, congestion, stuffiness, runny nose, sore throat, diarrhea, nausea, vomiting, cold or Flu symptoms, recent or current infections. It is specially important if the infection is over the area that we intend to treat. . Heart and lung problems: Symptoms that may suggest an active cardiopulmonary problem include: cough, chest pain, breathing difficulties or shortness of breath, dizziness, ankle swelling, uncontrolled high or unusually low blood pressure, and/or palpitations. If you are experiencing any of these symptoms, cancel   your procedure and contact your primary care physician for an evaluation.  Remember:   Regular Business hours are:  Monday to Thursday 8:00 AM to 4:00 PM  Provider's Schedule: Tanza Pellot, MD:  Procedure days: Tuesday and Thursday 7:30 AM to 4:00 PM  Bilal Lateef, MD:  Procedure days: Monday and Wednesday 7:30 AM to 4:00 PM ____________________________________________________________________________________________    ______________________________________________________________________________________________  Specialty Pain Scale  Introduction:  There are significant differences in how pain is reported. The word pain usually refers to physical pain, but it is also a common synonym of suffering. The medical community uses a scale from 0 (zero) to 10 (ten) to report pain level. Zero (0) is described as "no pain", while ten (10) is described as "the worse pain you can imagine". The problem with this scale is that physical pain is reported along with suffering. Suffering refers to mental pain, or more often yet it refers to any unpleasant feeling, emotion or aversion associated with the perception of harm or threat of harm. It is the psychological component of pain.  Pain Specialists prefer to separate the two components. The pain scale used by this practice is the Verbal Numerical Rating Scale (VNRS-11). This scale is for the physical pain only. DO NOT INCLUDE how your pain psychologically affects you. This scale is for adults 21 years of age and older. It has 11 (eleven) levels. The 1st level is 0/10. This means: "right now, I have no pain". In the context of pain management, it also means: "right now, my physical pain is under control with the current therapy".  General Information:  The scale should reflect your current level of pain. Unless you are specifically asked for the level of your worst pain, or your average pain. If you are asked for one of these two, then it should be understood that it is over the past 24 hours.  Levels 1 (one) through 5 (five) are  described below, and can be treated as an outpatient. Ambulatory pain management facilities such as ours are more than adequate to treat these levels. Levels 6 (six) through 10 (ten) are also described below, however, these must be treated as a hospitalized patient. While levels 6 (six) and 7 (seven) may be evaluated at an urgent care facility, levels 8 (eight) through 10 (ten) constitute medical emergencies and as such, they belong in a hospital's emergency department. When having these levels (as described below), do not come to our office. Our facility is not equipped to manage these levels. Go directly to an urgent care facility or an emergency department to be evaluated.  Definitions:  Activities of Daily Living (ADL): Activities of daily living (ADL or ADLs) is a term used in healthcare to refer to people's daily self-care activities. Health professionals often use a person's ability or inability to perform ADLs as a measurement of their functional status, particularly in regard to people post injury, with disabilities and the elderly. There are two ADL levels: Basic and Instrumental. Basic Activities of Daily Living (BADL  or BADLs) consist of self-care tasks that include: Bathing and showering; personal hygiene and grooming (including brushing/combing/styling hair); dressing; Toilet hygiene (getting to the toilet, cleaning oneself, and getting back up); eating and self-feeding (not including cooking or chewing and swallowing); functional mobility, often referred to as "transferring", as measured by the ability to walk, get in and out of bed, and get into and out of a chair; the broader definition (moving from one place to another while performing activities)   is useful for people with different physical abilities who are still able to get around independently. Basic ADLs include the things many people do when they get up in the morning and get ready to go out of the house: get out of bed, go to the  toilet, bathe, dress, groom, and eat. On the average, loss of function typically follows a particular order. Hygiene is the first to go, followed by loss of toilet use and locomotion. The last to go is the ability to eat. When there is only one remaining area in which the person is independent, there is a 62.9% chance that it is eating and only a 3.5% chance that it is hygiene. Instrumental Activities of Daily Living (IADL or IADLs) are not necessary for fundamental functioning, but they let an individual live independently in a community. IADL consist of tasks that include: cleaning and maintaining the house; home establishment and maintenance; care of others (including selecting and supervising caregivers); care of pets; child rearing; managing money; managing financials (investments, etc.); meal preparation and cleanup; shopping for groceries and necessities; moving within the community; safety procedures and emergency responses; health management and maintenance (taking prescribed medications); and using the telephone or other form of communication.  Instructions:  Most patients tend to report their pain as a combination of two factors, their physical pain and their psychosocial pain. This last one is also known as "suffering" and it is reflection of how physical pain affects you socially and psychologically. From now on, report them separately.  From this point on, when asked to report your pain level, report only your physical pain. Use the following table for reference.  Pain Clinic Pain Levels (0-5/10)  Pain Level Score  Description  No Pain 0   Mild pain 1 Nagging, annoying, but does not interfere with basic activities of daily living (ADL). Patients are able to eat, bathe, get dressed, toileting (being able to get on and off the toilet and perform personal hygiene functions), transfer (move in and out of bed or a chair without assistance), and maintain continence (able to control bladder and  bowel functions). Blood pressure and heart rate are unaffected. A normal heart rate for a healthy adult ranges from 60 to 100 bpm (beats per minute).   Mild to moderate pain 2 Noticeable and distracting. Impossible to hide from other people. More frequent flare-ups. Still possible to adapt and function close to normal. It can be very annoying and may have occasional stronger flare-ups. With discipline, patients may get used to it and adapt.   Moderate pain 3 Interferes significantly with activities of daily living (ADL). It becomes difficult to feed, bathe, get dressed, get on and off the toilet or to perform personal hygiene functions. Difficult to get in and out of bed or a chair without assistance. Very distracting. With effort, it can be ignored when deeply involved in activities.   Moderately severe pain 4 Impossible to ignore for more than a few minutes. With effort, patients may still be able to manage work or participate in some social activities. Very difficult to concentrate. Signs of autonomic nervous system discharge are evident: dilated pupils (mydriasis); mild sweating (diaphoresis); sleep interference. Heart rate becomes elevated (>115 bpm). Diastolic blood pressure (lower number) rises above 100 mmHg. Patients find relief in laying down and not moving.   Severe pain 5 Intense and extremely unpleasant. Associated with frowning face and frequent crying. Pain overwhelms the senses.  Ability to do any activity or maintain social   relationships becomes significantly limited. Conversation becomes difficult. Pacing back and forth is common, as getting into a comfortable position is nearly impossible. Pain wakes you up from deep sleep. Physical signs will be obvious: pupillary dilation; increased sweating; goosebumps; brisk reflexes; cold, clammy hands and feet; nausea, vomiting or dry heaves; loss of appetite; significant sleep disturbance with inability to fall asleep or to remain asleep. When  persistent, significant weight loss is observed due to the complete loss of appetite and sleep deprivation.  Blood pressure and heart rate becomes significantly elevated. Caution: If elevated blood pressure triggers a pounding headache associated with blurred vision, then the patient should immediately seek attention at an urgent or emergency care unit, as these may be signs of an impending stroke.    Emergency Department Pain Levels (6-10/10)  Emergency Room Pain 6 Severely limiting. Requires emergency care and should not be seen or managed at an outpatient pain management facility. Communication becomes difficult and requires great effort. Assistance to reach the emergency department may be required. Facial flushing and profuse sweating along with potentially dangerous increases in heart rate and blood pressure will be evident.   Distressing pain 7 Self-care is very difficult. Assistance is required to transport, or use restroom. Assistance to reach the emergency department will be required. Tasks requiring coordination, such as bathing and getting dressed become very difficult.   Disabling pain 8 Self-care is no longer possible. At this level, pain is disabling. The individual is unable to do even the most "basic" activities such as walking, eating, bathing, dressing, transferring to a bed, or toileting. Fine motor skills are lost. It is difficult to think clearly.   Incapacitating pain 9 Pain becomes incapacitating. Thought processing is no longer possible. Difficult to remember your own name. Control of movement and coordination are lost.   The worst pain imaginable 10 At this level, most patients pass out from pain. When this level is reached, collapse of the autonomic nervous system occurs, leading to a sudden drop in blood pressure and heart rate. This in turn results in a temporary and dramatic drop in blood flow to the brain, leading to a loss of consciousness. Fainting is one of the body's  self defense mechanisms. Passing out puts the brain in a calmed state and causes it to shut down for a while, in order to begin the healing process.    Summary: 1. Refer to this scale when providing [place] with your pain level. 2. Be accurate and careful when reporting your pain level. This will help with your care. 3. Over-reporting your pain level will lead to loss of credibility. 4. Even a level of 1/10 means that there is pain and will be treated at our facility. 5. High, inaccurate reporting will be documented as "Symptom Exaggeration", leading to loss of credibility and suspicions of possible secondary gains such as obtaining more narcotics, or wanting to appear disabled, for fraudulent reasons. 6. Only pain levels of 5 or below will be seen at our facility. 7. Pain levels of 6 and above will be sent to the Emergency Department and the appointment cancelled. ______________________________________________________________________________________________  ____________________________________________________________________________________________  General Risks and Possible Complications  Patient Responsibilities: It is important that you read this as it is part of your informed consent. It is our duty to inform you of the risks and possible complications associated with treatments offered to you. It is your responsibility as a patient to read this and to ask questions about anything that is not clear or  that you believe was not covered in this document.  Patient's Rights: You have the right to refuse treatment. You also have the right to change your mind, even after initially having agreed to have the treatment done. However, under this last option, if you wait until the last second to change your mind, you may be charged for the materials used up to that point.  Introduction: Medicine is not an Chief Strategy Officer. Everything in Medicine, including the lack of treatment(s), carries the potential for  danger, harm, or loss (which is by definition: Risk). In Medicine, a complication is a secondary problem, condition, or disease that can aggravate an already existing one. All treatments carry the risk of possible complications. The fact that a side effects or complications occurs, does not imply that the treatment was conducted incorrectly. It must be clearly understood that these can happen even when everything is done following the highest safety standards.  No treatment: You can choose not to proceed with the proposed treatment alternative. The "PRO(s)" would include: avoiding the risk of complications associated with the therapy. The "CON(s)" would include: not getting any of the treatment benefits. These benefits fall under one of three categories: diagnostic; therapeutic; and/or palliative. Diagnostic benefits include: getting information which can ultimately lead to improvement of the disease or symptom(s). Therapeutic benefits are those associated with the successful treatment of the disease. Finally, palliative benefits are those related to the decrease of the primary symptoms, without necessarily curing the condition (example: decreasing the pain from a flare-up of a chronic condition, such as incurable terminal cancer).  General Risks and Complications: These are associated to most interventional treatments. They can occur alone, or in combination. They fall under one of the following six (6) categories: no benefit or worsening of symptoms; bleeding; infection; nerve damage; allergic reactions; and/or death. 1. No benefits or worsening of symptoms: In Medicine there are no guarantees, only probabilities. No healthcare provider can ever guarantee that a medical treatment will work, they can only state the probability that it may. Furthermore, there is always the possibility that the condition may worsen, either directly, or indirectly, as a consequence of the treatment. 2. Bleeding: This is more  common if the patient is taking a blood thinner, either prescription or over the counter (example: Goody Powders, Fish oil, Aspirin, Garlic, etc.), or if suffering a condition associated with impaired coagulation (example: Hemophilia, cirrhosis of the liver, low platelet counts, etc.). However, even if you do not have one on these, it can still happen. If you have any of these conditions, or take one of these drugs, make sure to notify your treating physician. 3. Infection: This is more common in patients with a compromised immune system, either due to disease (example: diabetes, cancer, human immunodeficiency virus [HIV], etc.), or due to medications or treatments (example: therapies used to treat cancer and rheumatological diseases). However, even if you do not have one on these, it can still happen. If you have any of these conditions, or take one of these drugs, make sure to notify your treating physician. 4. Nerve Damage: This is more common when the treatment is an invasive one, but it can also happen with the use of medications, such as those used in the treatment of cancer. The damage can occur to small secondary nerves, or to large primary ones, such as those in the spinal cord and brain. This damage may be temporary or permanent and it may lead to impairments that can range from temporary  numbness to permanent paralysis and/or brain death. 5. Allergic Reactions: Any time a substance or material comes in contact with our body, there is the possibility of an allergic reaction. These can range from a mild skin rash (contact dermatitis) to a severe systemic reaction (anaphylactic reaction), which can result in death. 6. Death: In general, any medical intervention can result in death, most of the time due to an unforeseen complication. ____________________________________________________________________________________________

## 2018-09-28 ENCOUNTER — Encounter: Payer: Self-pay | Admitting: Pain Medicine

## 2018-09-28 ENCOUNTER — Other Ambulatory Visit: Payer: Self-pay

## 2018-09-28 ENCOUNTER — Ambulatory Visit (HOSPITAL_BASED_OUTPATIENT_CLINIC_OR_DEPARTMENT_OTHER): Payer: BC Managed Care – PPO | Admitting: Pain Medicine

## 2018-09-28 ENCOUNTER — Ambulatory Visit
Admission: RE | Admit: 2018-09-28 | Discharge: 2018-09-28 | Disposition: A | Discharge status: Home / Self Care | Payer: BC Managed Care – PPO | Source: Ambulatory Visit | Attending: Pain Medicine | Admitting: Pain Medicine

## 2018-09-28 ENCOUNTER — Ambulatory Visit: Payer: BC Managed Care – PPO | Admitting: Allergy and Immunology

## 2018-09-28 VITALS — BP 122/64 | HR 63 | Temp 98.1°F | Resp 16 | Ht 63.0 in | Wt 222.0 lb

## 2018-09-28 DIAGNOSIS — Z981 Arthrodesis status: Secondary | ICD-10-CM | POA: Diagnosis present

## 2018-09-28 DIAGNOSIS — M5030 Other cervical disc degeneration, unspecified cervical region: Secondary | ICD-10-CM | POA: Diagnosis not present

## 2018-09-28 DIAGNOSIS — Z91013 Allergy to seafood: Secondary | ICD-10-CM

## 2018-09-28 DIAGNOSIS — G959 Disease of spinal cord, unspecified: Secondary | ICD-10-CM | POA: Insufficient documentation

## 2018-09-28 DIAGNOSIS — M47812 Spondylosis without myelopathy or radiculopathy, cervical region: Secondary | ICD-10-CM | POA: Insufficient documentation

## 2018-09-28 DIAGNOSIS — M4802 Spinal stenosis, cervical region: Secondary | ICD-10-CM | POA: Diagnosis present

## 2018-09-28 DIAGNOSIS — M542 Cervicalgia: Secondary | ICD-10-CM | POA: Insufficient documentation

## 2018-09-28 HISTORY — DX: Allergy to seafood: Z91.013

## 2018-09-28 MED ORDER — SODIUM CHLORIDE (PF) 0.9 % IJ SOLN
INTRAMUSCULAR | Status: AC
  Filled 2018-09-28: qty 10

## 2018-09-28 MED ORDER — SODIUM CHLORIDE 0.9% FLUSH
1.0000 mL | Freq: Once | INTRAVENOUS | Status: AC
  Administered 2018-09-28: 1 mL

## 2018-09-28 MED ORDER — LACTATED RINGERS IV SOLN
1000.0000 mL | Freq: Once | INTRAVENOUS | Status: AC
  Administered 2018-09-28: 1000 mL via INTRAVENOUS

## 2018-09-28 MED ORDER — DEXAMETHASONE SODIUM PHOSPHATE 10 MG/ML IJ SOLN
10.0000 mg | Freq: Once | INTRAMUSCULAR | Status: AC
  Administered 2018-09-28: 10 mg
  Filled 2018-09-28: qty 1

## 2018-09-28 MED ORDER — ROPIVACAINE HCL 2 MG/ML IJ SOLN
1.0000 mL | Freq: Once | INTRAMUSCULAR | Status: AC
  Administered 2018-09-28: 1 mL via EPIDURAL
  Filled 2018-09-28: qty 10

## 2018-09-28 MED ORDER — FENTANYL CITRATE (PF) 100 MCG/2ML IJ SOLN
25.0000 ug | INTRAMUSCULAR | Status: DC | PRN
  Filled 2018-09-28: qty 2

## 2018-09-28 MED ORDER — LIDOCAINE HCL 2 % IJ SOLN
20.0000 mL | Freq: Once | INTRAMUSCULAR | Status: AC
  Administered 2018-09-28: 400 mg
  Filled 2018-09-28: qty 40

## 2018-09-28 MED ORDER — MIDAZOLAM HCL 5 MG/5ML IJ SOLN
1.0000 mg | INTRAMUSCULAR | Status: DC | PRN
  Filled 2018-09-28: qty 5

## 2018-09-28 NOTE — Progress Notes (Addendum)
Patient's Name: Anita Gibbs  MRN: [id number]  Referring Provider: Jodelle Green, FNP  DOB: 06-10-1971  PCP: Jodelle Green, FNP  DOS: 09/28/2018  Note by: Gaspar Cola, MD  Service setting: Ambulatory outpatient  Specialty: Interventional Pain Management  Patient type: Established  Location: [facility] (AMB) Pain Management Facility  Visit type: Interventional Procedure   Primary Reason for Visit: Interventional Pain Management Treatment. CC: Back Pain (Across Lower Back)  Procedure:          Anesthesia, Analgesia, Anxiolysis:  Type: Diagnostic, Inter-Laminar, Cervical Epidural Steroid Injection  #1  Region: Posterior Cervico-thoracic Region Level: C7-T1 Laterality: Right-Sided Paramedial  Type: Moderate (Conscious) Sedation combined with Local Anesthesia Indication(s): Analgesia and Anxiety Route: Intravenous (IV) IV Access: Secured Sedation: Meaningful verbal contact was maintained at all times during the procedure  Local Anesthetic: Lidocaine 1-2%  Position: Prone with head of the table was raised to facilitate breathing.   Indications: 1. DDD (degenerative disc disease), cervical   2. 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 Cervical spondylosis   5. Cervicalgia (Primary Area of Pain) (Bilateral) (L>R)   6. Cervical myelopathy (HCC)   7. History of fusion of cervical spine (ACDF C4-C7)    Pain Score: Pre-procedure: 9 /10 Post-procedure: 0-No pain/10  Pre-op Assessment:  Anita Gibbs is a 48 y.o. (year old), female patient, seen today for interventional treatment. She  has a past surgical history that includes Knee arthroscopy (Right) and Anterior cervical decomp/discectomy fusion (N/A, 07/01/2017). Anita Gibbs has a current medication list which includes the following prescription(s): albuterol, amlodipine, aspirin-acetaminophen-caffeine, calcium carbonate, vitamin d3, diphenhydramine,  epinephrine, ergocalciferol, famotidine, gabapentin, losartan, magnesium, methocarbamol, metronidazole, and oxycodone. Her primarily concern today is the Back Pain (Across Lower Back)  Initial Vital Signs:  Pulse/HCG Rate: 63ECG Heart Rate: (!) 57 Temp: 98.2 F (36.8 C) Resp: 18 BP: (!) 142/73 SpO2: 100 %  BMI: Estimated body mass index is 39.33 kg/m as calculated from the following:   Height as of this encounter: 5\' 3"  (1.6 m).   Weight as of this encounter: 222 lb (100.7 kg).  Risk Assessment: Allergies: Reviewed. She is allergic to other; shellfish allergy; strawberry (diagnostic); and tomato.  Allergy Precautions: No iodine containing solutions or radiological contrast used. Coagulopathies: Reviewed. None identified.  Blood-thinner therapy: None at this time Active Infection(s): Reviewed. None identified. Anita Gibbs is afebrile  Site Confirmation: Anita Gibbs was asked to confirm the procedure and laterality before marking the site Procedure checklist: Completed Consent: Before the procedure and under the influence of no sedative(s), amnesic(s), or anxiolytics, the patient was informed of the treatment options, risks and possible complications. To fulfill our ethical and legal obligations, as recommended by the American Medical Association's Code of Ethics, I have informed the patient of my clinical impression; the nature and purpose of the treatment or procedure; the risks, benefits, and possible complications of the intervention; the alternatives, including doing nothing; the risk(s) and benefit(s) of the alternative treatment(s) or procedure(s); and the risk(s) and benefit(s) of doing nothing. The patient was provided information about the general risks and possible complications associated with the procedure. These may include, but are not limited to: failure to achieve desired goals, infection, bleeding, organ or nerve damage, allergic reactions, paralysis, and death. In addition,  the patient was informed of those risks and complications associated to Spine-related procedures, such as failure to decrease pain; infection (i.e.: Meningitis, epidural or  intraspinal abscess); bleeding (i.e.: epidural hematoma, subarachnoid hemorrhage, or any other type of intraspinal or peri-dural bleeding); organ or nerve damage (i.e.: Any type of peripheral nerve, nerve root, or spinal cord injury) with subsequent damage to sensory, motor, and/or autonomic systems, resulting in permanent pain, numbness, and/or weakness of one or several areas of the body; allergic reactions; (i.e.: anaphylactic reaction); and/or death. Furthermore, the patient was informed of those risks and complications associated with the medications. These include, but are not limited to: allergic reactions (i.e.: anaphylactic or anaphylactoid reaction(s)); adrenal axis suppression; blood sugar elevation that in diabetics may result in ketoacidosis or comma; water retention that in patients with history of congestive heart failure may result in shortness of breath, pulmonary edema, and decompensation with resultant heart failure; weight gain; swelling or edema; medication-induced neural toxicity; particulate matter embolism and blood vessel occlusion with resultant organ, and/or nervous system infarction; and/or aseptic necrosis of one or more joints. Finally, the patient was informed that Medicine is not an exact science; therefore, there is also the possibility of unforeseen or unpredictable risks and/or possible complications that may result in a catastrophic outcome. The patient indicated having understood very clearly. We have given the patient no guarantees and we have made no promises. Enough time was given to the patient to ask questions, all of which were answered to the patient's satisfaction. Anita Gibbs has indicated that she wanted to continue with the procedure. Attestation: I, the ordering provider, attest that I have  discussed with the patient the benefits, risks, side-effects, alternatives, likelihood of achieving goals, and potential problems during recovery for the procedure that I have provided informed consent. Date  Time: 09/28/2018  8:52 AM  Pre-Procedure Preparation:  Monitoring: As per clinic protocol. Respiration, ETCO2, SpO2, BP, heart rate and rhythm monitor placed and checked for adequate function Safety Precautions: Patient was assessed for positional comfort and pressure points before starting the procedure. Time-out: I initiated and conducted the "Time-out" before starting the procedure, as per protocol. The patient was asked to participate by confirming the accuracy of the "Time Out" information. Verification of the correct person, site, and procedure were performed and confirmed by me, the nursing staff, and the patient. "Time-out" conducted as per Joint Commission's Universal Protocol (UP.01.01.01). Time: 1950  Description of Procedure:          Target Area: For Epidural Steroid injections the target is the interlaminar space, initially targeting the lower border of the superior vertebral body lamina. Approach: Paramedial approach. Area Prepped: Entire PosteriorCervical Region Prepping solution: ChloraPrep (2% chlorhexidine gluconate and 70% isopropyl alcohol) Safety Precautions: Aspiration looking for blood return was conducted prior to all injections. At no point did we inject any substances, as a needle was being advanced. No attempts were made at seeking any paresthesias. Safe injection practices and needle disposal techniques used. Medications properly checked for expiration dates. SDV (single dose vial) medications used. Description of the Procedure: Protocol guidelines were followed. The procedure needle was introduced through the skin, ipsilateral to the reported pain, and advanced to the target area. Bone was contacted and the needle walked caudad, until the lamina was cleared. The  epidural space was identified using "loss-of-resistance technique" with 2-3 ml of PF-NaCl (0.9% NSS), in a 5cc LOR glass syringe. Vitals:   09/28/18 1006 09/28/18 1016 09/28/18 1026 09/28/18 1036  BP: 114/71 125/71 120/69 122/64  Pulse:      Resp: 15 19 13 16   Temp:  98.2 F (36.8 C)  98.1 F (36.7  C)  TempSrc:      SpO2: 96% 99% 99% 98%  Weight:      Height:        Start Time: 0958 hrs. End Time: 1005 hrs. Materials:  Needle(s) Type: Epidural needle Gauge: 17G Length: 3.5-in Medication(s): Please see orders for medications and dosing details.  Imaging Guidance (Spinal):          Type of Imaging Technique: Fluoroscopy Guidance (Spinal) Indication(s): Assistance in needle guidance and placement for procedures requiring needle placement in or near specific anatomical locations not easily accessible without such assistance. Exposure Time: Please see nurses notes. Contrast: None used. Fluoroscopic Guidance: I was personally present during the use of fluoroscopy. "Tunnel Vision Technique" used to obtain the best possible view of the target area. Parallax error corrected before commencing the procedure. "Direction-depth-direction" technique used to introduce the needle under continuous pulsed fluoroscopy. Once target was reached, antero-posterior, oblique, and lateral fluoroscopic projection used confirm needle placement in all planes. Images permanently stored in EMR. Interpretation: No contrast injected. I personally interpreted the imaging intraoperatively. Adequate needle placement confirmed in multiple planes. Permanent images saved into the patient's record.  Antibiotic Prophylaxis:   Anti-infectives (From admission, onward)   None     Indication(s): None identified  Post-operative Assessment:  Post-procedure Vital Signs:  Pulse/HCG Rate: 6360 Temp: 98.1 F (36.7 C) Resp: 16 BP: 122/64 SpO2: 98 %  EBL: None  Complications: No immediate post-treatment complications  observed by team, or reported by patient.  Note: The patient tolerated the entire procedure well. A repeat set of vitals were taken after the procedure and the patient was kept under observation following institutional policy, for this type of procedure. Post-procedural neurological assessment was performed, showing return to baseline, prior to discharge. The patient was provided with post-procedure discharge instructions, including a section on how to identify potential problems. Should any problems arise concerning this procedure, the patient was given instructions to immediately contact us, at any time, without hesitation. In any case, we plan to contact the patient by telephone for a follow-up status report regarding this interventional procedure.  Comments:  No additional relevant information.  Plan of Care    Imaging Orders     DG C-Arm 1-60 Min-No Report  Procedure Orders     Cervical Epidural Injection  Medications ordered for procedure: Meds ordered this encounter  Medications  . lidocaine (XYLOCAINE) 2 % (with pres) injection 400 mg  . DISCONTD: midazolam (VERSED) 5 MG/5ML injection 1-2 mg    Make sure Flumazenil is available in the pyxis when using this medication. If oversedation occurs, administer 0.2 mg IV over 15 sec. If after 45 sec no response, administer 0.2 mg again over 1 min; may repeat at 1 min intervals; not to exceed 4 doses (1 mg)  . DISCONTD: fentaNYL (SUBLIMAZE) injection 25-50 mcg    Make sure Narcan is available in the pyxis when using this medication. In the event of respiratory depression (RR< 8/min): Titrate NARCAN (naloxone) in increments of 0.1 to 0.2 mg IV at 2-3 minute intervals, until desired degree of reversal.  . lactated ringers infusion 1,000 mL  . sodium chloride flush (NS) 0.9 % injection 1 mL  . ropivacaine (PF) 2 mg/mL (0.2%) (NAROPIN) injection 1 mL  . dexamethasone (DECADRON) injection 10 mg   Medications administered: We administered  lidocaine, lactated ringers, sodium chloride flush, ropivacaine (PF) 2 mg/mL (0.2%), and dexamethasone.  See the medical record for exact dosing, route, and time of administration.  Disposition:  Discharge home  Discharge Date & Time: 09/28/2018; 1037 hrs.   Physician-requested Follow-up: Return for post-procedure eval (2 wks), w/ Dr. Dossie Arbour.  Future Appointments  Date Time Provider [place]  10/07/2018  9:00 AM [facility] NURSE [facility]  10/12/2018 11:30 AM Kozlow, Donnamarie Poag, MD [facility] None  10/13/2018 11:15 AM Milinda Pointer, MD [facility] None  11/29/2018  9:00 AM Guse, Jacquelynn Cree, FNP [facility]   Primary Care Physician: Jodelle Green, FNP Location: [facility] Note by: Gaspar Cola, MD Date: 09/28/2018; Time: 1:57 PM  Disclaimer:  Medicine is not an Chief Strategy Officer. The only guarantee in medicine is that nothing is guaranteed. It is important to note that the decision to proceed with this intervention was based on the information collected from the patient. The Data and conclusions were drawn from the patient's questionnaire, the interview, and the physical examination. Because the information was provided in large part by the patient, it cannot be guaranteed that it has not been purposely or unconsciously manipulated. Every effort has been made to obtain as much relevant data as possible for this evaluation. It is important to note that the conclusions that lead to this procedure are derived in large part from the available data. Always take into account that the treatment will also be dependent on availability of resources and existing treatment guidelines, considered by other Pain Management Practitioners as being common knowledge and practice, at the time of the intervention. For Medico-Legal purposes, it is also important to point out that variation in procedural techniques and pharmacological choices are the acceptable norm. The indications,  contraindications, technique, and results of the above procedure should only be interpreted and judged by a Board-Certified Interventional Pain Specialist with extensive familiarity and expertise in the same exact procedure and technique.

## 2018-09-28 NOTE — Addendum Note (Signed)
Addended by: Milinda Pointer A on: 09/28/2018 01:57 PM   Modules accepted: Orders

## 2018-09-28 NOTE — Patient Instructions (Signed)

## 2018-09-29 ENCOUNTER — Telehealth: Payer: Self-pay

## 2018-09-29 NOTE — Telephone Encounter (Signed)
Post procedure phone call.  Patient states she is doing well.  

## 2018-09-30 ENCOUNTER — Telehealth: Payer: Self-pay | Admitting: Pain Medicine

## 2018-09-30 IMAGING — CR DG CERVICAL SPINE 2 OR 3 VIEWS
1 series · 4 of 4 positions shown · non-contrast
Comparison: None

CLINICAL DATA: Status post cervical spine fusion.

EXAM:
CERVICAL SPINE - 2-3 VIEW

[Series 1: dg cervical spine 2 or 3 views · 0.14mm/px · 4 of 4 slices shown]
[im 1/4]
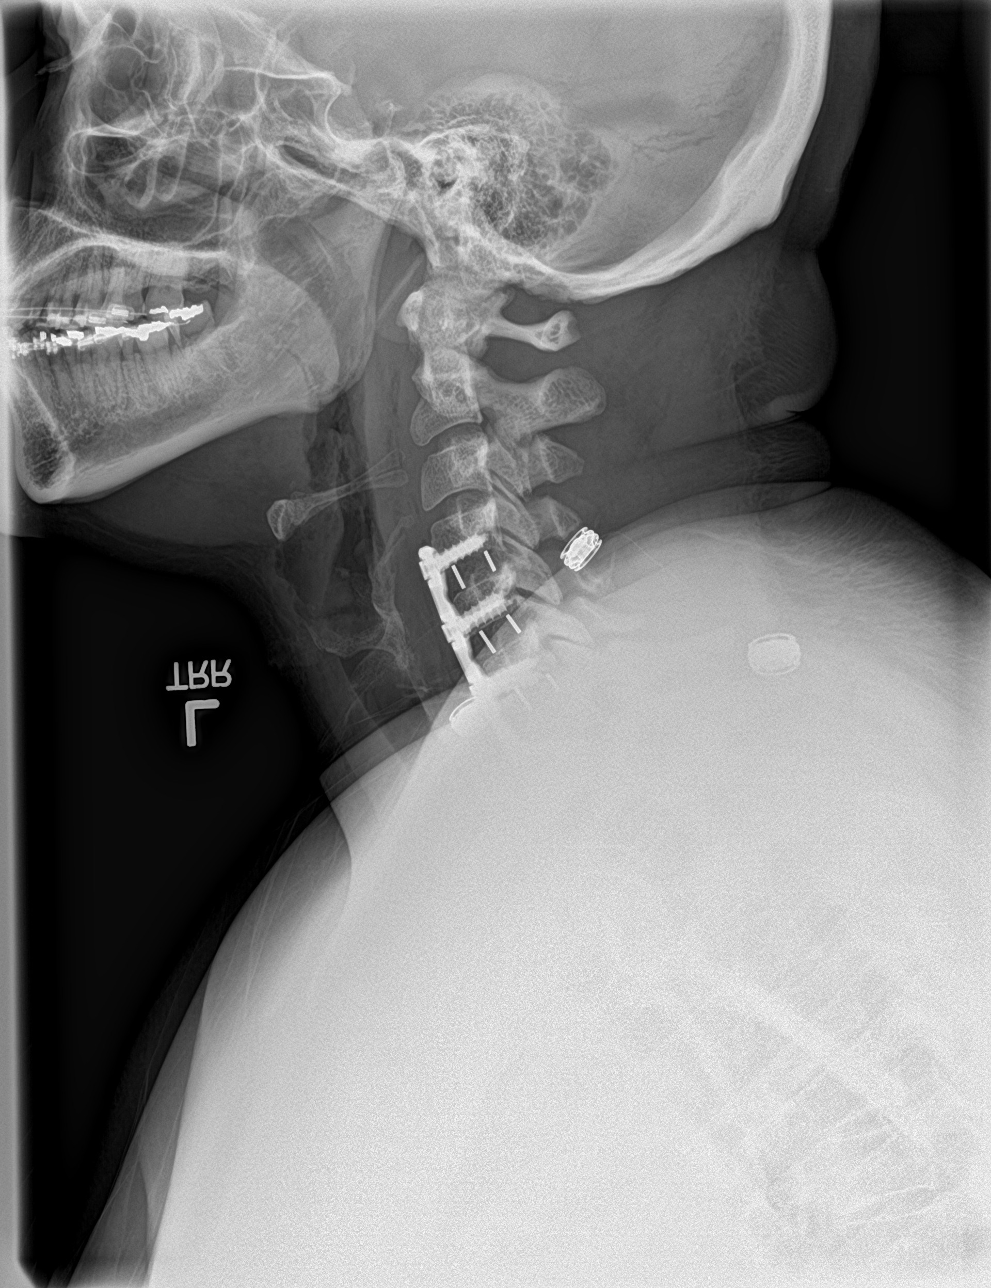
[im 2/4]
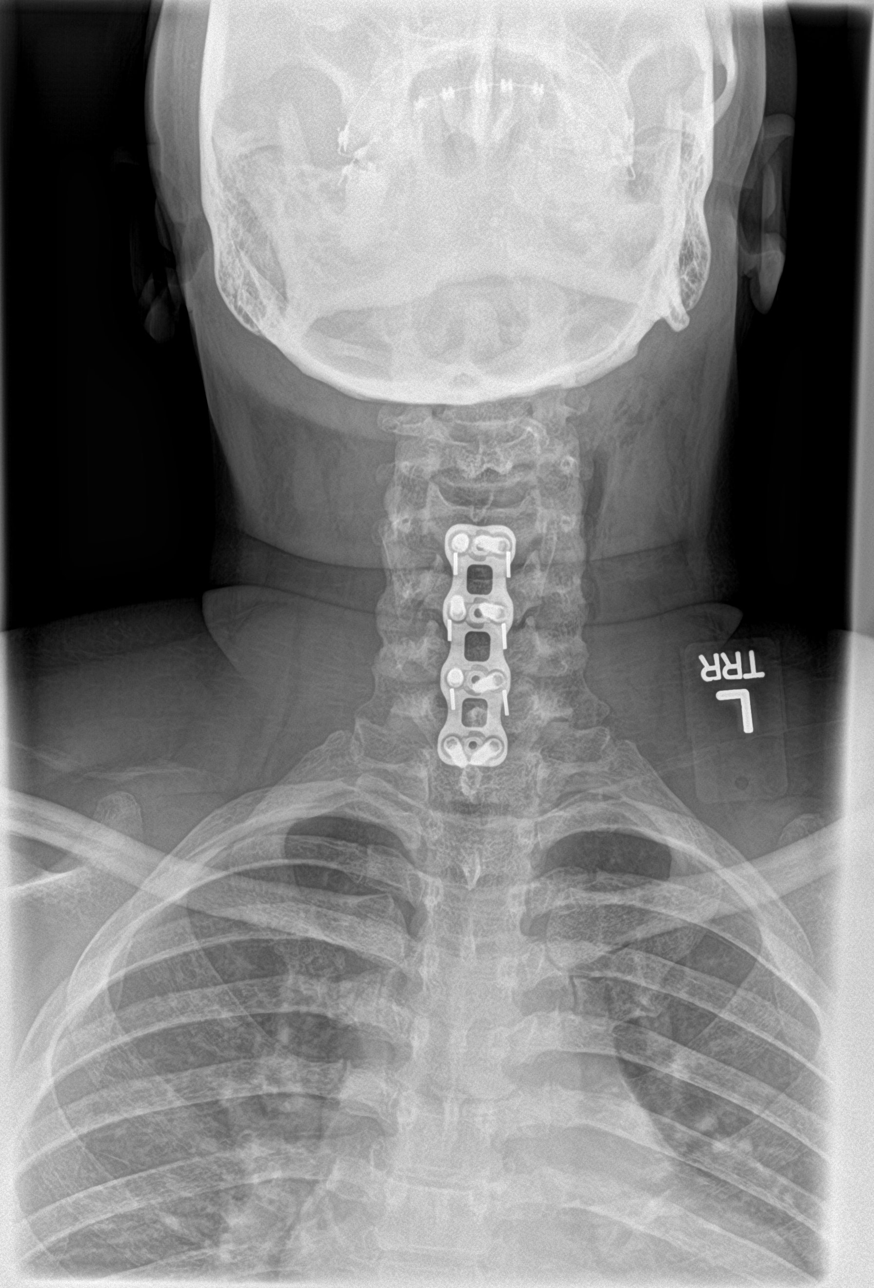
[im 3/4]
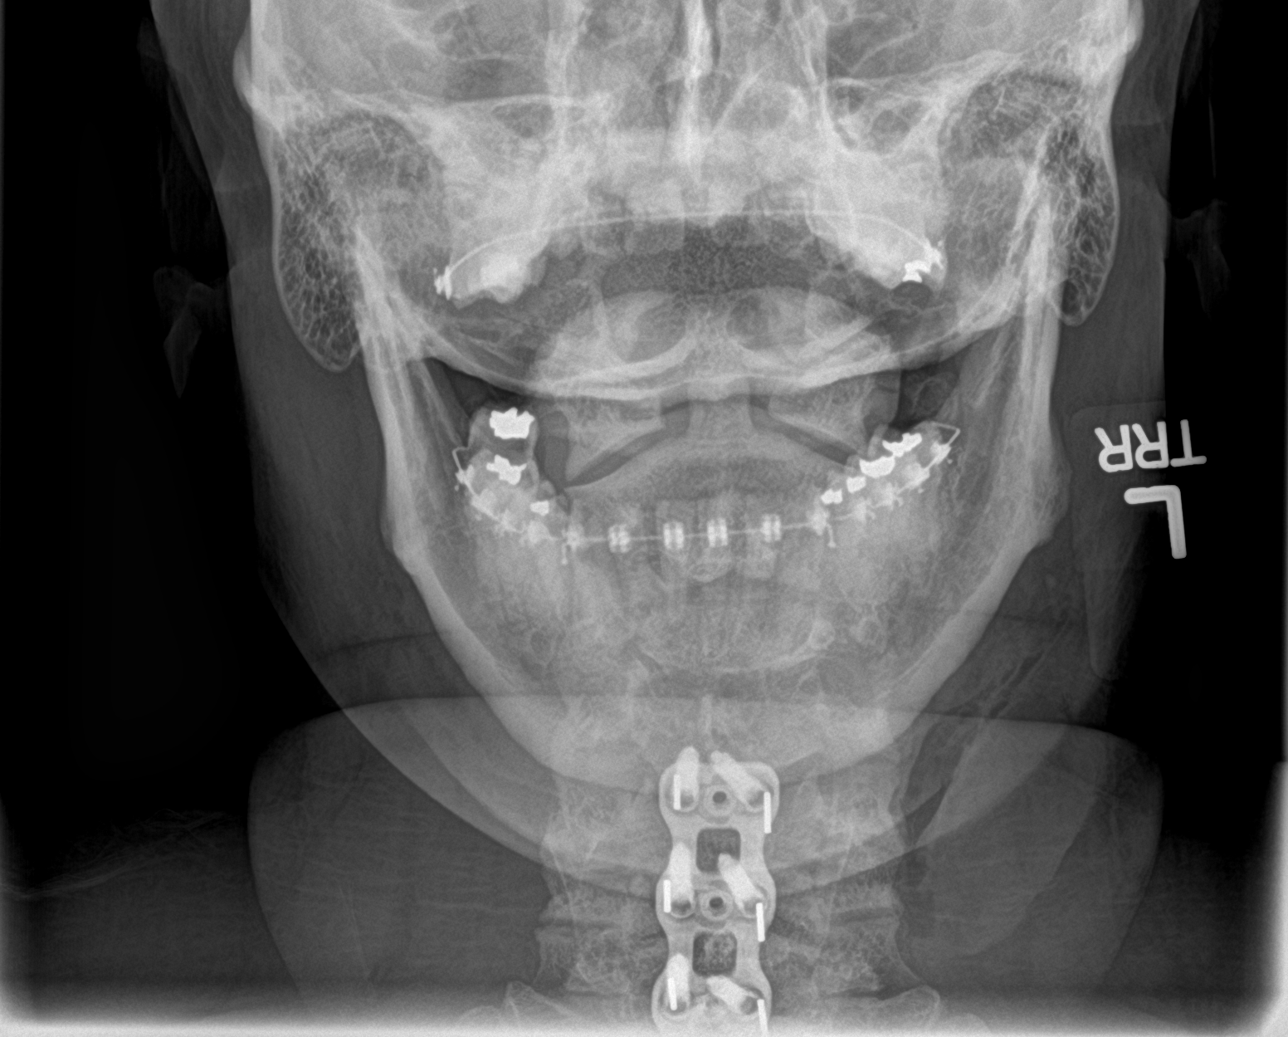
[im 4/4]
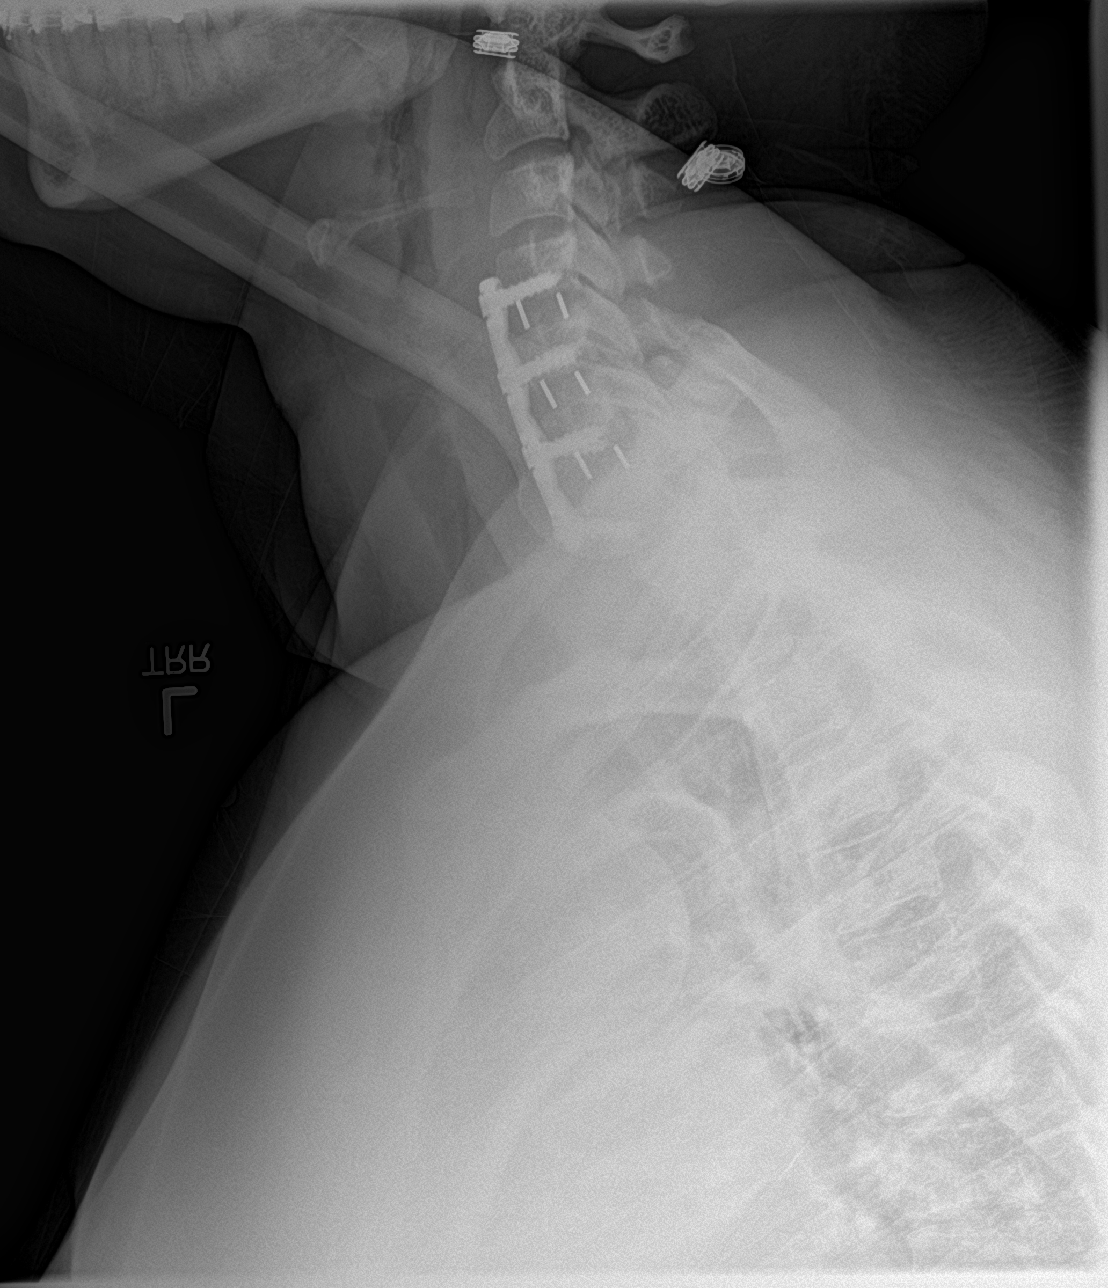

[4 of 4 positions shown; findings below may reference images not displayed]

FINDINGS: Anterior C4 through C7 fusion with interbody spacers. C7-T1
alignment is not well evaluated due to overlying osseous and soft
tissue structures. Alignment otherwise maintained. Recent
postsurgical change with air in the prevertebral soft tissues.
IMPRESSION: Post anterior C4 through C7 fusion with interbody spacers. No
immediate postoperative complication.

## 2018-09-30 NOTE — Telephone Encounter (Signed)
Pt called and stated that she had a procedure on Tuesday with Dr Delane Ginger and he told her to take Vitamin D2 and now pt states that she is broke out in a rash that looks like mosquito bites on her legs and neck. Please call pt back.

## 2018-09-30 NOTE — Telephone Encounter (Signed)
Spoke with patient.  States she took Vitamin D2 last night and this morning developed a rash on her neck, chest and inner legs.  Dr Dossie Arbour notified.  Dr Dossie Arbour states to have patient stop the vitamin D2 and try the vitamin D3 and see if the same thing happens and to let us know.  Patient states understanding and will call us back to inform us whether or not the vitamin D3 caused a rash.

## 2018-10-07 ENCOUNTER — Ambulatory Visit (INDEPENDENT_AMBULATORY_CARE_PROVIDER_SITE_OTHER): Payer: BC Managed Care – PPO

## 2018-10-07 VITALS — BP 142/78

## 2018-10-07 DIAGNOSIS — I10 Essential (primary) hypertension: Secondary | ICD-10-CM | POA: Diagnosis not present

## 2018-10-07 NOTE — Progress Notes (Signed)
BP looks improved  Continue current medications & planned appts as scheduled

## 2018-10-08 ENCOUNTER — Encounter: Payer: Self-pay | Admitting: Family Medicine

## 2018-10-08 ENCOUNTER — Ambulatory Visit: Payer: BC Managed Care – PPO | Admitting: Family Medicine

## 2018-10-08 VITALS — BP 138/82 | HR 70 | Temp 98.8°F | Resp 18 | Ht 63.0 in | Wt 220.6 lb

## 2018-10-08 DIAGNOSIS — I10 Essential (primary) hypertension: Secondary | ICD-10-CM

## 2018-10-08 DIAGNOSIS — Z9109 Other allergy status, other than to drugs and biological substances: Secondary | ICD-10-CM | POA: Diagnosis not present

## 2018-10-08 MED ORDER — FLUTICASONE PROPIONATE 50 MCG/ACT NA SUSP: 2.0000 | Freq: Every day | NASAL | 6 refills | Status: DC

## 2018-10-08 MED ORDER — MONTELUKAST SODIUM 10 MG PO TABS: 10.0000 mg | ORAL_TABLET | Freq: Every day | ORAL | 4 refills | Status: DC

## 2018-10-08 NOTE — Patient Instructions (Signed)
Take zyrtec in AM and singulair in PM  Use flonase  Try putting vaseline on q-tip and rubbing inside nose to help dryness in nose

## 2018-10-08 NOTE — Progress Notes (Signed)
Subjective:    Patient ID: Anita Gibbs, female    DOB: 11-17-1970, 48 y.o.   MRN: [id number]  HPI   Patient presents to clinic due to sneezing, itchy nose, clear nasal drainage, itchy throat, ringing in ears, ear fullness and puffy eyes for the past 2 to 3 weeks.  Patient states Anita Gibbs has a long history of multiple environmental allergies.  Currently is taking Zyrtec every day.  States Zyrtec does help somewhat, but allergy symptoms have seemed worse over the past few weeks.  Also patient has been tolerating the addition of amlodipine to her losartan very well for blood pressure control.  No chest pain, palpitations or lower extremity swelling.  Patient Active Problem List   Diagnosis Date Noted  . History of allergy to shellfish 09/28/2018  . Chronic upper extremity pain (Secondary Area of Pain) (Bilateral) (R>L) 09/27/2018  . Cervicalgia (Primary Area of Pain) (Bilateral) (L>R) 09/27/2018  . History of fusion of cervical spine (ACDF C4-C7) 09/27/2018  . Abnormal MRI, cervical spine (06/10/2018) 09/27/2018  . 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 Cervical facet joint syndrome (Bilateral) (L>R) 09/27/2018  . Cervical facet hypertrophy (C3-T1) 09/27/2018  . 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 Chronic sacroiliac joint dysfunction (Bilateral) 09/27/2018  . 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  . Somatic dysfunction of sacroiliac joint (Bilateral) 09/27/2018  . DDD (degenerative disc disease), cervical 09/27/2018  . Chronic neck pain (Bilateral) w/ history of cervical spinal surgery 09/27/2018  . Spondylosis, cervical, w/ myelopathy 09/27/2018  . Cervical spondylosis 09/27/2018  . DDD (degenerative disc disease), lumbar 09/27/2018  . Vitamin D deficiency 09/27/2018  . Elevated sed rate 09/08/2018  . Chronic neck pain (Primary Area of Pain) (Bilateral) (L>R) 09/07/2018  . Chronic low back pain  [facility] Area of Pain) (Bilateral) (R>L) w/ sciatica (Bilateral) 09/07/2018  . Chronic lower extremity pain (Fourth Area of Pain) (Bilateral) (R>L) 09/07/2018  . Chronic pain syndrome 09/07/2018  . Pharmacologic therapy 09/07/2018  . Disorder of skeletal system 09/07/2018  . Problems influencing health status 09/07/2018  . Chronic sacroiliac joint pain (Right) 09/07/2018  . Chronic low back pain [facility] Area of Pain) (Bilateral) (R>L) w/o sciatica 07/26/2018  . Essential hypertension 05/31/2018  . Chronic gout of foot (Left) 05/31/2018  . Numbness and tingling of both feet 05/31/2018  . Hyperkalemia 05/31/2018  . Cervical myelopathy ([place]) 07/01/2017   Social History   Tobacco Use  . Smoking status: Never Smoker  . Smokeless tobacco: Never Used  Substance Use Topics  . Alcohol use: Yes    Comment: occ   Review of Systems  Constitutional: Negative for chills, fatigue and fever.  HENT:+ congestion, ear pain, sinus pain, bloody nose x1, sneezing, itchy throat.   Eyes: + puffy/itchy eyes.   Respiratory: Negative for cough, shortness of breath and wheezing.   Cardiovascular: Negative for chest pain, palpitations and leg swelling.  Gastrointestinal: Negative for abdominal pain, diarrhea, nausea and vomiting.  Genitourinary: Negative for dysuria, frequency and urgency.  Musculoskeletal: Negative for arthralgias and myalgias.  Skin: Negative for color change, pallor and rash.  Neurological: Negative for syncope, light-headedness and headaches.  Psychiatric/Behavioral: The patient is not nervous/anxious.       Objective:   Physical Exam Vitals signs and nursing note reviewed.  Constitutional:      General: Anita Gibbs is not in acute distress.    Appearance: Anita Gibbs is not ill-appearing or toxic-appearing.  HENT:  Head: Normocephalic and atraumatic.     Ears:     Comments: Fullness bilat TMs    Nose: Congestion and rhinorrhea (clear) present.     Mouth/Throat:     Mouth: Mucous  membranes are moist.     Pharynx: No oropharyngeal exudate or posterior oropharyngeal erythema.     Comments: +post nasal drip Eyes:     General: No scleral icterus.    Extraocular Movements: Extraocular movements intact.     Conjunctiva/sclera: Conjunctivae normal.     Comments: Some conjunctival redness. No crusting or discharge.  Neck:     Musculoskeletal: Neck supple. No neck rigidity.  Cardiovascular:     Rate and Rhythm: Normal rate and regular rhythm.  Pulmonary:     Effort: Pulmonary effort is normal. No respiratory distress.     Breath sounds: Normal breath sounds.  Lymphadenopathy:     Cervical: No cervical adenopathy.  Skin:    General: Skin is warm and dry.     Coloration: Skin is not jaundiced or pale.  Neurological:     Mental Status: Anita Gibbs is alert and oriented to person, place, and time.  Psychiatric:        Mood and Affect: Mood normal.        Behavior: Behavior normal.    BP Readings from Last 3 Encounters:  10/07/18 (!) 142/78  09/28/18 122/64  09/27/18 (!) 137/56   Vitals:   10/08/18 0848  BP: 138/82  Pulse: 70  Resp: 18  Temp: 98.8 F (37.1 C)  SpO2: 99%       Assessment & Plan:   Multiple environmental allergies-patient will take Zyrtec in the mornings and add Singulair in the p.m.  Anita Gibbs will use Flonase nasal spray once daily.  Also advised to help soothe dryness and nasal passages Anita Gibbs can put Vaseline on a Q-tip and rub inside the nose.  Advised to increase fluid intake, and do good handwashing.  Essential hypertension-blood pressure looks wonderful since addition of amlodipine.  Anita Gibbs will continue losartan and amlodipine.  Patient will keep already planned follow-up at the end of March 2020.

## 2018-10-11 ENCOUNTER — Other Ambulatory Visit: Payer: Self-pay | Admitting: Family Medicine

## 2018-10-11 NOTE — Telephone Encounter (Signed)
Pt called and states that she has experienced SOB since last Friday. Pt was seen in the office with Ander Purpura, NP on 10/08/18. Pt not in distress at this time. Prescription for albuterol was previously ordered by a historical provider.

## 2018-10-11 NOTE — Telephone Encounter (Signed)
Copied from [place] [id number]. Topic: Quick Communication - Rx Refill/Question >> Oct 11, 2018 12:55 PM Leward Quan A wrote: Medication: albuterol (PROVENTIL HFA;VENTOLIN HFA) 108 (90 Base) MCG/ACT inhaler  Patient say that she is having SOB upon exertion  Has the patient contacted their pharmacy? Yes.   (Agent: If no, request that the patient contact the pharmacy for the refill.) (Agent: If yes, when and what did the pharmacy advise?)  Preferred Pharmacy (with phone number or street name): [place], [place] - [place] [phone] (Phone) [phone] (Fax)    Agent: Please be advised that RX refills may take up to 3 business days. We ask that you follow-up with your pharmacy.

## 2018-10-12 ENCOUNTER — Ambulatory Visit: Payer: BC Managed Care – PPO | Admitting: Family Medicine

## 2018-10-12 ENCOUNTER — Ambulatory Visit: Payer: BC Managed Care – PPO | Admitting: Allergy and Immunology

## 2018-10-12 ENCOUNTER — Ambulatory Visit: Payer: Self-pay | Admitting: *Deleted

## 2018-10-12 ENCOUNTER — Encounter: Payer: Self-pay | Admitting: Family Medicine

## 2018-10-12 VITALS — BP 116/78 | HR 62 | Temp 98.9°F | Resp 16 | Ht 63.0 in | Wt 224.6 lb

## 2018-10-12 DIAGNOSIS — J101 Influenza due to other identified influenza virus with other respiratory manifestations: Secondary | ICD-10-CM | POA: Diagnosis not present

## 2018-10-12 DIAGNOSIS — R059 Cough, unspecified: Secondary | ICD-10-CM

## 2018-10-12 DIAGNOSIS — R05 Cough: Secondary | ICD-10-CM | POA: Diagnosis not present

## 2018-10-12 LAB — POC INFLUENZA A&B (BINAX/QUICKVUE)
Influenza A, POC: POSITIVE — AB
Influenza B, POC: NEGATIVE

## 2018-10-12 MED ORDER — ONDANSETRON 4 MG PO TBDP: 4.0000 mg | ORAL_TABLET | Freq: Three times a day (TID) | ORAL | 0 refills | Status: DC | PRN

## 2018-10-12 MED ORDER — HYDROCOD POLST-CPM POLST ER 10-8 MG/5ML PO SUER: 5.0000 mL | Freq: Two times a day (BID) | ORAL | 0 refills | Status: DC | PRN

## 2018-10-12 MED ORDER — ALBUTEROL SULFATE HFA 108 (90 BASE) MCG/ACT IN AERS: 2.0000 | INHALATION_SPRAY | Freq: Four times a day (QID) | RESPIRATORY_TRACT | 5 refills | Status: DC | PRN

## 2018-10-12 NOTE — Progress Notes (Signed)
Subjective:    Patient ID: Anita Gibbs, female    DOB: 09/16/1970, 48 y.o.   MRN: [id number]  HPI   Patient presents to clinic complaining of cough, nasal congestion, shortness of breath, body aches, fever and chills, feeling rundown that began 3 days ago on Saturday, 10/09/2018.  States for the most part before Saturday she has been feeling well other than some regular seasonal allergy symptoms.  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  Patient Active Problem List   Diagnosis Date Noted  . History of allergy to shellfish 09/28/2018  . Chronic upper extremity pain (Secondary Area of Pain) (Bilateral) (R>L) 09/27/2018  . Cervicalgia (Primary Area of Pain) (Bilateral) (L>R) 09/27/2018  . History of fusion of cervical spine (ACDF C4-C7) 09/27/2018  . Abnormal MRI, cervical spine (06/10/2018) 09/27/2018  . 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 Cervical facet joint syndrome (Bilateral) (L>R) 09/27/2018  . Cervical facet hypertrophy (C3-T1) 09/27/2018  . 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 Chronic sacroiliac joint dysfunction (Bilateral) 09/27/2018  . 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  . Somatic dysfunction of sacroiliac joint (Bilateral) 09/27/2018  . DDD (degenerative disc disease), cervical 09/27/2018  . Chronic neck pain (Bilateral) w/ history of cervical spinal surgery 09/27/2018  . Spondylosis, cervical, w/ myelopathy 09/27/2018  . Cervical spondylosis 09/27/2018  . DDD (degenerative disc disease), lumbar 09/27/2018  . Vitamin D deficiency 09/27/2018  . Elevated sed rate 09/08/2018  . Chronic neck pain (Primary Area of Pain) (Bilateral) (L>R) 09/07/2018  . Chronic low back pain [facility] Area of Pain) (Bilateral) (R>L) w/ sciatica (Bilateral) 09/07/2018  . Chronic lower extremity pain (Fourth Area of Pain) (Bilateral) (R>L)  09/07/2018  . Chronic pain syndrome 09/07/2018  . Pharmacologic therapy 09/07/2018  . Disorder of skeletal system 09/07/2018  . Problems influencing health status 09/07/2018  . Chronic sacroiliac joint pain (Right) 09/07/2018  . Chronic low back pain [facility] Area of Pain) (Bilateral) (R>L) w/o sciatica 07/26/2018  . Essential hypertension 05/31/2018  . Chronic gout of foot (Left) 05/31/2018  . Numbness and tingling of both feet 05/31/2018  . Hyperkalemia 05/31/2018  . Cervical myelopathy ([place]) 07/01/2017   Social History   Tobacco Use  . Smoking status: Never Smoker  . Smokeless tobacco: Never Used  Substance Use Topics  . Alcohol use: Yes    Comment: occ   Review of Systems  Constitutional: +chills, fatigue and fever.  HENT: +nasal congestion. Negative for ear pain, sinus pain and sore throat.   Eyes: Negative.   Respiratory:+cough, shortness of breath and wheezing.   Cardiovascular: Negative for chest pain, palpitations and leg swelling.  Gastrointestinal: Negative for abdominal pain, diarrhea, and vomiting. +nausea Genitourinary: Negative for dysuria, frequency and urgency.  Musculoskeletal: +aches  Skin: Negative for color change, pallor and rash.  Neurological: Negative for syncope, light-headedness and headaches.  Psychiatric/Behavioral: The patient is not nervous/anxious.       Objective:   Physical Exam Vitals signs and nursing note reviewed.  Constitutional:      General: She is not in acute distress.    Appearance: She is ill-appearing (appears tired). She is not toxic-appearing.  HENT:     Head: Normocephalic and atraumatic.     Nose: Congestion and rhinorrhea present.     Mouth/Throat:     Mouth: Mucous membranes are moist.     Pharynx: Posterior oropharyngeal erythema present.  No oropharyngeal exudate.  Eyes:     General: No scleral icterus.       Right eye: No discharge.        Left eye: No discharge.     Extraocular Movements: Extraocular  movements intact.     Conjunctiva/sclera: Conjunctivae normal.  Neck:     Musculoskeletal: Neck supple. No neck rigidity.  Cardiovascular:     Rate and Rhythm: Normal rate and regular rhythm.  Pulmonary:     Effort: Pulmonary effort is normal. No respiratory distress.     Breath sounds: Normal breath sounds. No wheezing, rhonchi or rales.  Abdominal:     General: Bowel sounds are normal.     Palpations: Abdomen is soft.     Tenderness: There is no abdominal tenderness.  Lymphadenopathy:     Cervical: No cervical adenopathy.  Skin:    General: Skin is warm and dry.  Neurological:     Mental Status: She is alert and oriented to person, place, and time.  Psychiatric:        Mood and Affect: Mood normal.        Behavior: Behavior normal.    Vitals:   10/12/18 1323  BP: 116/78  Pulse: 62  Resp: 16  Temp: 98.9 F (37.2 C)  SpO2: 100%      Assessment & Plan:    Influenza A - point-of-care flu testing is positive for flu A.  Due to being on third day of symptoms she is out of range for Tamiflu.  She will use albuterol inhaler as needed to treat shortness of breath and wheezing.  She will take Zofran as needed for any nausea.  She will use Tylenol or Advil if needed for aches/fever.  Prescribed Tussionex syrup to use as needed for cough, made aware that this med can cause drowsiness so do not take prior to driving.  Advised she is considered contagious from 5 to 7 days of symptom onset, out of work note given.  She will rest, keep up good fluid intake and do good handwashing.  Advised she should begin to feel better by end of this week.  If she is not feeling better as expected, advised to call office for reevaluation.  She will otherwise keep already scheduled follow-up as planned.

## 2018-10-12 NOTE — Telephone Encounter (Signed)
FYI Pt called stating that she was seen in the office on 10/08/2018; starting 10/09/2018 she started having shortness of breath, dry cough, and nausea; she actually did vomit on the night of 10/11/2018; the pt says that she is short of breath all of the time; the pt has not used the albuterol inhaler (sent to [facility]); the pt says that it should have been sent to [place];  temp 99.8 at 2030 10/11/2018; recommendations made per nurse triage protocol; the pt does not want to go to ED/urgent care; she would like to be seen in the office; spoke with Judson Roch and given ok to schedule pt; pt offered and accepted appointment with Philis Nettle, [place], 10/12/2018 at 1320; further explained to pt that the provider may also recommend that she should be evaluated in ED/Urgent Care; she verbalized understanding; will route to office for notification; will also have new prescription for albuterol inhaler sent to [place]

## 2018-10-12 NOTE — Telephone Encounter (Addendum)
Pt called stating that she was seen in the office on 10/08/2018; starting 10/09/2018 she started having shortness of breath, dry cough, and nausea; she actually did vomit on the night of 10/11/2018; the pt says that she is short of breath all of the time; the pt has not used the albuterol inhaler (sent to [place]); the pt says that it should have been sent to [place];  temp 99.8 at 2030 10/11/2018; recommendations made per nurse triage protocol; the pt does not want to go to ED/urgent care; she would like to be seen in the office; spoke with Judson Roch and given ok to schedule pt; pt offered and accepted appointment with Philis Nettle, [place], 10/12/2018 at 1320; further explained to pt that the provider may also recommend that she should be evaluated in ED/Urgent Care; she verbalized understanding; will route to office for notification; will also have new prescription for albuterol inhaler sent to [facility].   Reason for Disposition . [1] MILD difficulty breathing (e.g., minimal/no SOB at rest, SOB with walking, pulse <100) AND [2] NEW-onset or WORSE than normal  Answer Assessment - Initial Assessment Questions 1. RESPIRATORY STATUS: "Describe your breathing?" (e.g., wheezing, shortness of breath, unable to speak, severe coughing)      Short of breath 2. ONSET: "When did this breathing problem begin?"      10/09/2018 3. PATTERN "Does the difficult breathing come and go, or has it been constant since it started?"      constant 4. SEVERITY: "How bad is your breathing?" (e.g., mild, moderate, severe)    - MILD: No SOB at rest, mild SOB with walking, speaks normally in sentences, can lay down, no retractions, pulse < 100.    - MODERATE: SOB at rest, SOB with minimal exertion and prefers to sit, cannot lie down flat, speaks in phrases, mild retractions, audible wheezing, pulse 100-120.    - SEVERE: Very SOB at rest, speaks in single words, struggling to breathe, sitting  hunched forward, retractions, pulse > 120      moderate 5. RECURRENT SYMPTOM: "Have you had difficulty breathing before?" If so, ask: "When was the last time?" and "What happened that time?"      Yes due to allergies 6. CARDIAC HISTORY: "Do you have any history of heart disease?" (e.g., heart attack, angina, bypass surgery, angioplasty)      hypertension 7. LUNG HISTORY: "Do you have any history of lung disease?"  (e.g., pulmonary embolus, asthma, emphysema)     no 8. CAUSE: "What do you think is causing the breathing problem?"      congestion 9. OTHER SYMPTOMS: "Do you have any other symptoms? (e.g., dizziness, runny nose, cough, chest pain, fever)     Dry cough, nasal congestion, nausea 10. PREGNANCY: "Is there any chance you are pregnant?" "When was your last menstrual period?"       No, No  11. TRAVEL: "Have you traveled out of the country in the last month?" (e.g., travel history, exposures)       No  Protocols used: BREATHING DIFFICULTY-A-AH

## 2018-10-12 NOTE — Patient Instructions (Signed)

## 2018-10-12 NOTE — Progress Notes (Deleted)
Patient's Name: Anita Gibbs  MRN: [id number]  Referring Provider: Jodelle Green, FNP  DOB: 1971/06/28  PCP: Jodelle Green, FNP  DOS: 10/13/2018  Note by: Gaspar Cola, MD  Service setting: Ambulatory outpatient  Specialty: Interventional Pain Management  Location: [facility] (AMB) Pain Management Facility    Patient type: Established   Primary Reason(s) for Visit: Encounter for post-procedure evaluation of chronic illness with mild to moderate exacerbation CC: No chief complaint on file.  HPI  Anita Gibbs is a 48 y.o. year old, female patient, who comes today for a post-procedure evaluation. She has Cervical myelopathy ([place]); Essential hypertension; Chronic gout of foot (Left); Numbness and tingling of both feet; Hyperkalemia; Chronic low back pain [facility] Area of Pain) (Bilateral) (R>L) w/o sciatica; Chronic neck pain (Primary Area of Pain) (Bilateral) (L>R); Chronic low back pain [facility] Area of Pain) (Bilateral) (R>L) w/ sciatica (Bilateral); Chronic lower extremity pain (Fourth Area of Pain) (Bilateral) (R>L); Chronic pain syndrome; Pharmacologic therapy; Disorder of skeletal system; Problems influencing health status; Chronic sacroiliac joint pain (Right); Elevated sed rate; Chronic upper extremity pain (Secondary Area of Pain) (Bilateral) (R>L); Cervicalgia (Primary Area of Pain) (Bilateral) (L>R); History of fusion of cervical spine (ACDF C4-C7); Abnormal MRI, cervical spine (06/10/2018); 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-5, C5-6, and C6-7); Cervical facet joint syndrome (Bilateral) (L>R); Cervical facet hypertrophy (C3-T1); 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 Her primarily concern today is the No chief complaint on file.  Pain Assessment: Location:     Radiating:   Onset:   Duration:   Quality:   Severity:  /10 (subjective, self-reported pain score)  Note: Reported level is compatible with observation.                         When using our objective Pain Scale, levels between 6 and 10/10 are said to belong in an emergency room, as it progressively worsens from a 6/10, described as severely limiting, requiring emergency care not usually available at an outpatient pain management facility. At a 6/10 level, communication becomes difficult and requires great effort. Assistance to reach the emergency department may be required. Facial flushing and profuse sweating along with potentially dangerous increases in heart rate and blood pressure will be evident. Effect on ADL:   Timing:   Modifying factors:   BP:    HR:    Anita Gibbs comes in today for post-procedure evaluation.  Further details on both, my assessment(s), as well as the proposed treatment plan, please see below.  Post-Procedure Assessment  09/30/2018 Procedure: Diagnostic right-sided Cervical ESI #1 under fluoroscopic guidance and IV sedation Pre-procedure pain score:  9/10 Post-procedure pain score: 0/10 (100% relief) Influential Factors: BMI:   Intra-procedural challenges: None observed.         Assessment challenges: None detected.              Reported side-effects: None.        Post-procedural adverse reactions or complications: None reported         Sedation: Sedation provided. When no sedatives are used, the analgesic levels obtained are directly associated to the effectiveness of the local  anesthetics. However, when sedation is provided, the level of analgesia obtained during the initial 1 hour following the intervention, is believed to be the  result of a combination of factors. These factors may include, but are not limited to: 1. The effectiveness of the local anesthetics used. 2. The effects of the analgesic(s) and/or anxiolytic(s) used. 3. The degree of discomfort experienced by the patient at the time of the procedure. 4. The patients ability and reliability in recalling and recording the events. 5. The presence and influence of possible secondary gains and/or psychosocial factors. Reported result: Relief experienced during the 1st hour after the procedure:   (Ultra-Short Term Relief)            Interpretative annotation: Clinically appropriate result. Analgesia during this period is likely to be Local Anesthetic and/or IV Sedative (Analgesic/Anxiolytic) related.          Effects of local anesthetic: The analgesic effects attained during this period are directly associated to the localized infiltration of local anesthetics and therefore cary significant diagnostic value as to the etiological location, or anatomical origin, of the pain. Expected duration of relief is directly dependent on the pharmacodynamics of the local anesthetic used. Long-acting (4-6 hours) anesthetics used.  Reported result: Relief during the next 4 to 6 hour after the procedure:   (Short-Term Relief)            Interpretative annotation: Clinically appropriate result. Analgesia during this period is likely to be Local Anesthetic-related.          Long-term benefit: Defined as the period of time past the expected duration of local anesthetics (1 hour for short-acting and 4-6 hours for long-acting). With the possible exception of prolonged sympathetic blockade from the local anesthetics, benefits during this period are typically attributed to, or associated with, other factors such as analgesic sensory neuropraxia, antiinflammatory effects, or beneficial biochemical changes provided by agents other than the local anesthetics.  Reported result: Extended relief  following procedure:   (Long-Term Relief)            Interpretative annotation: Clinically possible results. Good relief. No permanent benefit expected. Inflammation plays a part in the etiology to the pain.          Current benefits: Defined as reported results that persistent at this point in time.   Analgesia: *** %            Function: Somewhat improved ROM: Somewhat improved Interpretative annotation: Recurrence of symptoms. No permanent benefit expected. Effective diagnostic intervention.          Interpretation: Results would suggest a successful diagnostic intervention.                  Plan:  Please see "Plan of Care" for details.                Laboratory Chemistry  Inflammation Markers (CRP: Acute Phase) (ESR: Chronic Phase) Lab Results  Component Value Date   CRP 7 09/07/2018   ESRSEDRATE 67 (H) 09/07/2018                         Rheumatology Markers Lab Results  Component Value Date   LABURIC 6.0 05/31/2018                        Renal Markers Lab Results  Component Value Date   BUN 11 09/23/2018   CREATININE 0.89 09/23/2018   BCR 13 09/07/2018  GFRAA 76 09/07/2018   GFRNONAA 66 09/07/2018                             Hepatic Markers Lab Results  Component Value Date   AST 10 09/07/2018   ALT 10 08/03/2018   ALBUMIN 4.3 09/07/2018                        Neuropathy Markers Lab Results  Component Value Date   VITAMINB12 601 09/07/2018   HGBA1C 5.5 08/03/2018   HIV NON-REACTIVE 09/06/2018                        Hematology Parameters Lab Results  Component Value Date   INR 1.02 06/25/2017   LABPROT 13.3 06/25/2017   APTT 30 06/25/2017   PLT 286.0 08/03/2018   HGB 12.2 08/03/2018   HCT 37.1 08/03/2018                        CV Markers Lab Results  Component Value Date   TROPONINI <0.03 06/03/2017                         Note: Lab results reviewed.  Recent Imaging Results   Results for orders placed in visit on 09/28/18  DG C-Arm 1-60  Min-No Report   Narrative Fluoroscopy was utilized by the requesting physician.  No radiographic  interpretation.    Interpretation Report: Fluoroscopy was used during the procedure to assist with needle guidance. The images were interpreted intraoperatively by the requesting physician.  Meds   Current Outpatient Medications:  .  albuterol (PROVENTIL HFA;VENTOLIN HFA) 108 (90 Base) MCG/ACT inhaler, Inhale 2 puffs into the lungs every 6 (six) hours as needed for wheezing or shortness of breath., Disp: 1 Inhaler, Rfl: 5 .  amLODipine (NORVASC) 5 MG tablet, Take 1 tablet (5 mg total) by mouth daily., Disp: 90 tablet, Rfl: 1 .  aspirin-acetaminophen-caffeine (EXCEDRIN MIGRAINE) 250-250-65 MG tablet, Take 2 tablets by mouth every 6 (six) hours as needed for headache., Disp: , Rfl:  .  calcium carbonate (CALCIUM 600) 600 MG TABS tablet, Take 1 tablet (600 mg total) by mouth 2 (two) times daily with a meal for 30 days., Disp: 60 tablet, Rfl: 0 .  chlorpheniramine-HYDROcodone (TUSSIONEX PENNKINETIC ER) 10-8 MG/5ML SUER, Take 5 mLs by mouth every 12 (twelve) hours as needed., Disp: 140 mL, Rfl: 0 .  Cholecalciferol (VITAMIN D3) 125 MCG (5000 UT) CAPS, Take 1 capsule (5,000 Units total) by mouth daily with breakfast. Take along with calcium and magnesium., Disp: 30 capsule, Rfl: 5 .  diphenhydrAMINE (BENADRYL) 25 mg capsule, Take 25 mg by mouth every 6 (six) hours as needed (for allergies.)., Disp: , Rfl:  .  EPINEPHrine (AUVI-Q) 0.3 mg/0.3 mL IJ SOAJ injection, Use as directed for severe allergic reaction, Disp: 2 Device, Rfl: 2 .  ergocalciferol (VITAMIN D2) 1.25 MG (50000 UT) capsule, Take 1 capsule (50,000 Units total) by mouth 2 (two) times a week. X 6 weeks., Disp: 12 capsule, Rfl: 0 .  famotidine (PEPCID) 20 MG tablet, Take 1 tablet (20 mg total) by mouth 2 (two) times daily., Disp: 60 tablet, Rfl: 2 .  fluticasone (FLONASE) 50 MCG/ACT nasal spray, Place 2 sprays into both nostrils daily., Disp: 16  g, Rfl: 6 .  gabapentin (NEURONTIN) 300 MG capsule, Take 300   mg by mouth 3 (three) times daily., Disp: , Rfl:  .  losartan (COZAAR) 100 MG tablet, Take 1 tablet (100 mg total) by mouth daily., Disp: 90 tablet, Rfl: 3 .  Magnesium 500 MG CAPS, Take 1 capsule (500 mg total) by mouth 2 (two) times daily at 8 am and 10 pm., Disp: 60 capsule, Rfl: 5 .  methocarbamol (ROBAXIN) 500 MG tablet, Take 1 tablet (500 mg total) by mouth every 6 (six) hours as needed for muscle spasms., Disp: 90 tablet, Rfl: 0 .  metroNIDAZOLE (FLAGYL) 500 MG tablet, Take 1 tablet (500 mg total) by mouth 2 (two) times daily., Disp: 14 tablet, Rfl: 0 .  montelukast (SINGULAIR) 10 MG tablet, Take 1 tablet (10 mg total) by mouth at bedtime., Disp: 30 tablet, Rfl: 4 .  ondansetron (ZOFRAN ODT) 4 MG disintegrating tablet, Take 1 tablet (4 mg total) by mouth every 8 (eight) hours as needed for nausea or vomiting., Disp: 20 tablet, Rfl: 0 .  oxyCODONE (OXY IR/ROXICODONE) 5 MG immediate release tablet, Take 1 tablet (5 mg total) by mouth every 3 (three) hours as needed for moderate pain ((score 4 to 6))., Disp: 30 tablet, Rfl: 0  ROS  Constitutional: Denies any fever or chills Gastrointestinal: No reported hemesis, hematochezia, vomiting, or acute GI distress Musculoskeletal: Denies any acute onset joint swelling, redness, loss of ROM, or weakness Neurological: No reported episodes of acute onset apraxia, aphasia, dysarthria, agnosia, amnesia, paralysis, loss of coordination, or loss of consciousness  Allergies  Anita Gibbs is allergic to other; shellfish allergy; strawberry (diagnostic); and tomato.  PFSH  Drug: Anita Gibbs  reports no history of drug use. Alcohol:  reports current alcohol use. Tobacco:  reports that she has never smoked. She has never used smokeless tobacco. Medical:  has a past medical history of Arthritis, Carpal boss, right, Carpal tunnel syndrome, bilateral, Headache, and Hypertension. Surgical: Anita Gibbs   has a past surgical history that includes Knee arthroscopy (Right) and Anterior cervical decomp/discectomy fusion (N/A, 07/01/2017). Family: family history includes [name] in her mother; Arthritis in her father, mother, and sister; Breast cancer in her maternal aunt and paternal aunt; COPD in her brother and son; Cancer in her brother; Depression in her son; Diabetes in her mother; Early death in her brother; Hearing loss in her father; Hyperlipidemia in her mother; Hypertension in her father, mother, and sister; Kidney disease in her father.  Constitutional Exam  General appearance: Well nourished, well developed, and well hydrated. In no apparent acute distress There were no vitals filed for this visit. BMI Assessment: Estimated body mass index is 39.79 kg/m as calculated from the following:   Height as of 10/12/18: 5' 3" (1.6 m).   Weight as of 10/12/18: 224 lb 9.6 oz (101.9 kg).  BMI interpretation table: BMI level Category Range association with higher incidence of chronic pain  <18 kg/m2 Underweight   18.5-24.9 kg/m2 Ideal body weight   25-29.9 kg/m2 Overweight Increased incidence by 20%  30-34.9 kg/m2 Obese (Class I) Increased incidence by 68%  35-39.9 kg/m2 Severe obesity (Class II) Increased incidence by 136%  >40 kg/m2 Extreme obesity (Class III) Increased incidence by 254%   Patient's current BMI Ideal Body weight  There is no height or weight on file to calculate BMI. Ideal body weight: 52.4 kg (115 lb 8.3 oz) Adjusted ideal body weight: 72.2 kg (159 lb 2.4 oz)   BMI Readings from Last 4 Encounters:  10/12/18 39.79 kg/m  10/08/18 39.08 kg/m  09/28/18 39.33  kg/m  09/27/18 39.33 kg/m   Wt Readings from Last 4 Encounters:  10/12/18 224 lb 9.6 oz (101.9 kg)  10/08/18 220 lb 9.6 oz (100.1 kg)  09/28/18 222 lb (100.7 kg)  09/27/18 222 lb (100.7 kg)  Psych/Mental status: Alert, oriented x 3 (person, place, & time)       Eyes: PERLA Respiratory: No evidence of acute  respiratory distress  Cervical Spine Area Exam  Skin & Axial Inspection: No masses, redness, edema, swelling, or associated skin lesions Alignment: Symmetrical Functional ROM: Unrestricted ROM      Stability: No instability detected Muscle Tone/Strength: Functionally intact. No obvious neuro-muscular anomalies detected. Sensory (Neurological): Unimpaired Palpation: No palpable anomalies              Upper Extremity (UE) Exam    Side: Right upper extremity  Side: Left upper extremity  Skin & Extremity Inspection: Skin color, temperature, and hair growth are WNL. No peripheral edema or cyanosis. No masses, redness, swelling, asymmetry, or associated skin lesions. No contractures.  Skin & Extremity Inspection: Skin color, temperature, and hair growth are WNL. No peripheral edema or cyanosis. No masses, redness, swelling, asymmetry, or associated skin lesions. No contractures.  Functional ROM: Unrestricted ROM          Functional ROM: Unrestricted ROM          Muscle Tone/Strength: Functionally intact. No obvious neuro-muscular anomalies detected.  Muscle Tone/Strength: Functionally intact. No obvious neuro-muscular anomalies detected.  Sensory (Neurological): Unimpaired          Sensory (Neurological): Unimpaired          Palpation: No palpable anomalies              Palpation: No palpable anomalies              Provocative Test(s):  Phalen's test: deferred Tinel's test: deferred Apley's scratch test (touch opposite shoulder):  Action 1 (Across chest): deferred Action 2 (Overhead): deferred Action 3 (LB reach): deferred   Provocative Test(s):  Phalen's test: deferred Tinel's test: deferred Apley's scratch test (touch opposite shoulder):  Action 1 (Across chest): deferred Action 2 (Overhead): deferred Action 3 (LB reach): deferred    Thoracic Spine Area Exam  Skin & Axial Inspection: No masses, redness, or swelling Alignment: Symmetrical Functional ROM: Unrestricted ROM Stability:  No instability detected Muscle Tone/Strength: Functionally intact. No obvious neuro-muscular anomalies detected. Sensory (Neurological): Unimpaired Muscle strength & Tone: No palpable anomalies  Lumbar Spine Area Exam  Skin & Axial Inspection: No masses, redness, or swelling Alignment: Symmetrical Functional ROM: Unrestricted ROM       Stability: No instability detected Muscle Tone/Strength: Functionally intact. No obvious neuro-muscular anomalies detected. Sensory (Neurological): Unimpaired Palpation: No palpable anomalies       Provocative Tests: Hyperextension/rotation test: deferred today       Lumbar quadrant test (Kemp's test): deferred today       Lateral bending test: deferred today       Patrick's Maneuver: deferred today                   FABER* test: deferred today                   S-I anterior distraction/compression test: deferred today         S-I lateral compression test: deferred today         S-I Thigh-thrust test: deferred today         S-I Gaenslen's test: deferred today         *(  Flexion, ABduction and External Rotation)  Gait & Posture Assessment  Ambulation: Unassisted Gait: Relatively normal for age and body habitus Posture: WNL   Lower Extremity Exam    Side: Right lower extremity  Side: Left lower extremity  Stability: No instability observed          Stability: No instability observed          Skin & Extremity Inspection: Skin color, temperature, and hair growth are WNL. No peripheral edema or cyanosis. No masses, redness, swelling, asymmetry, or associated skin lesions. No contractures.  Skin & Extremity Inspection: Skin color, temperature, and hair growth are WNL. No peripheral edema or cyanosis. No masses, redness, swelling, asymmetry, or associated skin lesions. No contractures.  Functional ROM: Unrestricted ROM                  Functional ROM: Unrestricted ROM                  Muscle Tone/Strength: Functionally intact. No obvious neuro-muscular  anomalies detected.  Muscle Tone/Strength: Functionally intact. No obvious neuro-muscular anomalies detected.  Sensory (Neurological): Unimpaired        Sensory (Neurological): Unimpaired        DTR: Patellar: deferred today Achilles: deferred today Plantar: deferred today  DTR: Patellar: deferred today Achilles: deferred today Plantar: deferred today  Palpation: No palpable anomalies  Palpation: No palpable anomalies   Assessment   Status Diagnosis  Controlled Controlled Controlled 1. Cervicalgia (Primary Area of Pain) (Bilateral) (L>R)   2. Chronic upper extremity pain (Secondary Area of Pain) (Bilateral) (R>L)   3. Chronic low back pain [facility] Area of Pain) (Bilateral) (R>L) w/o sciatica   4. Chronic lower extremity pain (Fourth Area of Pain) (Bilateral) (R>L)   5. History of fusion of cervical spine (ACDF C4-C7)      Updated Problems: No problems updated.  Plan of Care  Pharmacotherapy (Medications Ordered): No orders of the defined types were placed in this encounter.  Medications administered today: Anita Gibbs. Anita Gibbs had no medications administered during this visit.  Procedure Orders    No procedure(s) ordered today   Lab Orders  No laboratory test(s) ordered today   Imaging Orders  No imaging studies ordered today   Referral Orders  No referral(s) requested today   Interventional management options: Planned, scheduled, and/or pending:   ***   Considering:   Diagnostic right-sided CESI #2  Diagnostic bilateral cervicalfacet nerve block  Possible bilateral cervical facet RFA  Diagnostic right-sided LESI  Diagnostic bilateral lumbar facet nerve block  Possible bilateral lumbar facet RFA  Diagnostic right-sided sacroiliac joint injection  Possible right-sided sacroiliac joint RFA    Palliative PRN treatment(s):   Diagnostic/therapeutic right-sided cervical ESI #2 under fluoroscopic guidance and IV sedation    Provider-requested follow-up: No  follow-ups on file.  Future Appointments  Date Time Provider [place]  10/13/2018 11:15 AM Milinda Pointer, MD [facility] None  11/09/2018  9:30 AM Shelton Silvas, PT [facility] None  11/11/2018  9:00 AM Shelton Silvas, PT [facility] None  11/16/2018  9:45 AM Shelton Silvas, PT [facility] None  11/18/2018  9:00 AM Shelton Silvas, PT [facility] None  11/23/2018  9:45 AM Shelton Silvas, PT [facility] None  11/25/2018  9:00 AM Shelton Silvas, PT [facility] None  11/29/2018  9:00 AM Jodelle Green, FNP [id number]  11/30/2018  9:45 AM Shelton Silvas, PT [facility] None  12/02/2018  9:45 AM Shelton Silvas, PT [facility] None  12/07/2018  9:00 AM Shelton Silvas, PT  [facility] None  12/10/2018  9:00 AM Miller, Chelsea, PT [facility] None  12/14/2018  9:00 AM Miller, Chelsea, PT [facility] None  12/16/2018  9:00 AM Miller, Chelsea, PT [facility] None  12/21/2018  9:00 AM Miller, Chelsea, PT [facility] None  12/23/2018  9:00 AM Miller, Chelsea, PT [facility] None  12/28/2018  9:00 AM Miller, Chelsea, PT [facility] None  12/30/2018  9:00 AM Miller, Chelsea, PT [facility] None   Primary Care Physician: Guse, Lauren M, FNP Location: [facility] Note by: Francisco A Naveira, MD Date: 10/13/2018; Time: 7:00 AM 

## 2018-10-13 ENCOUNTER — Ambulatory Visit: Payer: BC Managed Care – PPO | Admitting: Pain Medicine

## 2018-10-22 ENCOUNTER — Ambulatory Visit: Payer: Self-pay | Admitting: *Deleted

## 2018-10-22 ENCOUNTER — Encounter: Payer: Self-pay | Admitting: Family Medicine

## 2018-10-22 ENCOUNTER — Ambulatory Visit: Payer: BC Managed Care – PPO | Admitting: Family Medicine

## 2018-10-22 VITALS — BP 126/80 | HR 76 | Temp 98.7°F | Resp 18 | Ht 63.0 in | Wt 228.0 lb

## 2018-10-22 DIAGNOSIS — J019 Acute sinusitis, unspecified: Secondary | ICD-10-CM

## 2018-10-22 DIAGNOSIS — J029 Acute pharyngitis, unspecified: Secondary | ICD-10-CM

## 2018-10-22 LAB — POCT RAPID STREP A (OFFICE): Rapid Strep A Screen: NEGATIVE

## 2018-10-22 MED ORDER — AMOXICILLIN-POT CLAVULANATE 875-125 MG PO TABS: 1.0000 | ORAL_TABLET | Freq: Two times a day (BID) | ORAL | 0 refills | Status: DC

## 2018-10-22 NOTE — Progress Notes (Signed)
Subjective:    Patient ID: Anita Gibbs, female    DOB: 07/22/1971, 48 y.o.   MRN: [id number]  HPI  Patient presents to clinic complaining of sore throat, sinus pressure, thick yellow mucus when she blows her nose and is coughing up some yellow mucus as well.  She was here last week and diagnosed with the flu.  Patient states her flu symptoms have all improved except for the sinus congestion getting worse and the sore throat persisting.  No more fever or chills.  No body aches.  No shortness of breath or wheezing.  No chest pain.  No nausea/vomiting or diarrhea.  Patient Active Problem List   Diagnosis Date Noted  . History of allergy to shellfish 09/28/2018  . Chronic upper extremity pain (Secondary Area of Pain) (Bilateral) (R>L) 09/27/2018  . Cervicalgia (Primary Area of Pain) (Bilateral) (L>R) 09/27/2018  . History of fusion of cervical spine (ACDF C4-C7) 09/27/2018  . Abnormal MRI, cervical spine (06/10/2018) 09/27/2018  . 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 Cervical facet joint syndrome (Bilateral) (L>R) 09/27/2018  . Cervical facet hypertrophy (C3-T1) 09/27/2018  . 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 Chronic sacroiliac joint dysfunction (Bilateral) 09/27/2018  . 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  . Somatic dysfunction of sacroiliac joint (Bilateral) 09/27/2018  . DDD (degenerative disc disease), cervical 09/27/2018  . Chronic neck pain (Bilateral) w/ history of cervical spinal surgery 09/27/2018  . Spondylosis, cervical, w/ myelopathy 09/27/2018  . Cervical spondylosis 09/27/2018  . DDD (degenerative disc disease), lumbar 09/27/2018  . Vitamin D deficiency 09/27/2018  . Elevated sed rate 09/08/2018  . Chronic neck pain (Primary Area of Pain) (Bilateral) (L>R) 09/07/2018  . Chronic low back pain [facility] Area of Pain) (Bilateral) (R>L) w/ sciatica (Bilateral) 09/07/2018  . Chronic  lower extremity pain (Fourth Area of Pain) (Bilateral) (R>L) 09/07/2018  . Chronic pain syndrome 09/07/2018  . Pharmacologic therapy 09/07/2018  . Disorder of skeletal system 09/07/2018  . Problems influencing health status 09/07/2018  . Chronic sacroiliac joint pain (Right) 09/07/2018  . Chronic low back pain [facility] Area of Pain) (Bilateral) (R>L) w/o sciatica 07/26/2018  . Essential hypertension 05/31/2018  . Chronic gout of foot (Left) 05/31/2018  . Numbness and tingling of both feet 05/31/2018  . Hyperkalemia 05/31/2018  . Cervical myelopathy ([place]) 07/01/2017   Social History   Tobacco Use  . Smoking status: Never Smoker  . Smokeless tobacco: Never Used  Substance Use Topics  . Alcohol use: Yes    Comment: occ   Review of Systems   Constitutional: Negative for chills, fatigue and fever.  HENT: +congestion, ear pain, sinus pain, fullness, pressure, drainage and sore throat.   Eyes: Negative.   Respiratory: Negative for cough, shortness of breath and wheezing.   Cardiovascular: Negative for chest pain, palpitations and leg swelling.  Gastrointestinal: Negative for abdominal pain, diarrhea, nausea and vomiting.  Genitourinary: Negative for dysuria, frequency and urgency.  Musculoskeletal: Negative for arthralgias and myalgias.  Skin: Negative for color change, pallor and rash.  Neurological: Negative for syncope, light-headedness and headaches.  Psychiatric/Behavioral: The patient is not nervous/anxious.       Objective:   Physical Exam Vitals signs and nursing note reviewed.  Constitutional:      General: She is not in acute distress.    Appearance: She is not toxic-appearing or diaphoretic.  HENT:     Head: Normocephalic and atraumatic.     Right Ear:  There is no impacted cerumen.     Left Ear: There is no impacted cerumen.     Nose: Nasal tenderness, congestion and rhinorrhea present.     Right Sinus: Maxillary sinus tenderness and frontal sinus tenderness  present.     Left Sinus: Maxillary sinus tenderness and frontal sinus tenderness present.     Mouth/Throat:     Mouth: Mucous membranes are moist.     Pharynx: No oropharyngeal exudate or posterior oropharyngeal erythema.  Eyes:     General: No scleral icterus.    Extraocular Movements: Extraocular movements intact.     Conjunctiva/sclera: Conjunctivae normal.     Pupils: Pupils are equal, round, and reactive to light.  Neck:     Musculoskeletal: Neck supple. No neck rigidity.  Cardiovascular:     Rate and Rhythm: Normal rate and regular rhythm.  Pulmonary:     Effort: Pulmonary effort is normal. No respiratory distress.     Breath sounds: No wheezing, rhonchi or rales.  Lymphadenopathy:     Cervical: No cervical adenopathy.  Skin:    General: Skin is warm and dry.     Coloration: Skin is not pale.  Neurological:     Mental Status: She is alert and oriented to person, place, and time.  Psychiatric:        Mood and Affect: Mood normal.        Behavior: Behavior normal.    Vitals:   10/22/18 1312  BP: 126/80  Pulse: 76  Resp: 18  Temp: 98.7 F (37.1 C)  SpO2: 97%      Assessment & Plan:   Acute sinusitis- patient will take Augmentin twice daily for 10 days to treat sinus infection.  Advised to do saline nasal rinses to help reduce congestion.  Advised to keep up good fluid intake, get good rest and do good handwashing.  Point-of-care strep is negative in clinic.  She will keep regularly scheduled follow-up as planned.  Patient is aware she can return to clinic sooner if any issues arise.

## 2018-10-22 NOTE — Telephone Encounter (Signed)
Patient states she was diagnosed with flu on last week and the symptoms are coming back in she wants to know if there is anything she can take.  Patient states she was prescribed comfort measures for flu treatment- cough medication and inhaler and antinausea medication.  Cough has improved with treatment. Patient has started sneezing, mucus is turning yellow,and sore throat. 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  Reason for Disposition . [1] Using nasal washes and pain medicine > 24 hours AND [2] sinus pain (around cheekbone or eye) persists  Answer Assessment - Initial Assessment Questions 1. DIAGNOSIS CONFIRMATION: "When was the influenza diagnosed?" "By whom?" "Did you get a test for it?"     10/12/18- positive for type A 2. MEDICINES: "Were you prescribed any medications for the influenza?"  (e.g., zanamivir [Relenza], oseltamavir [Tamiflu]).      Cough medication, inhaler, antimedic 3. ONSET of SYMPTOMS: "When did your symptoms start?"     Throat has been sore this whole time- worse now than before 4. SYMPTOMS: "What symptoms are you most concerned about?" (e.g., runny nose, stuffy nose, sore throat, cough, breathing difficulty, fever)     Sore throat, change in mucus- nasal passages 5. COUGH: "How bad is the cough?"     cough is better 6. FEVER: "Do you have a fever?" If so, ask: "What is your temperature, how was it measured, and when did it start?"     No fever 7. RESPIRATORY DISTRESS: "Are you having any trouble breathing?" If yes, ask: "Describe your breathing."      Patient is still the inhaler 8. FLU VACCINE: "Did you receive a flu shot this year?" (e.g., seasonal influenza, H1N1)     yes 9. PREGNANCY: "Is there any chance you are pregnant?" "When was your last menstrual period?"     No- no cycle 10. HIGH RISK for COMPLICATIONS: "Do you have any heart or lung problems? Do you have a weakened immune system?" (e.g., CHF, COPD, asthma, HIV positive, chemotherapy,  renal failure, diabetes mellitus, sickle cell anemia)       no  Protocols used: INFLUENZA FOLLOW-UP CALL-A-AH

## 2018-10-22 NOTE — Patient Instructions (Signed)
Take a daily probiotic for at least the next 2 to 4 weeks to replace good bacteria in body   Sinusitis, Adult Sinusitis is soreness and swelling (inflammation) of your sinuses. Sinuses are hollow spaces in the bones around your face. They are located:  Around your eyes.  In the middle of your forehead.  Behind your nose.  In your cheekbones. Your sinuses and nasal passages are lined with a fluid called mucus. Mucus drains out of your sinuses. Swelling can trap mucus in your sinuses. This lets germs (bacteria, virus, or fungus) grow, which leads to infection. Most of the time, this condition is caused by a virus. What are the causes? This condition is caused by:  Allergies.  Asthma.  Germs.  Things that block your nose or sinuses.  Growths in the nose (nasal polyps).  Chemicals or irritants in the air.  Fungus (rare). What increases the risk? You are more likely to develop this condition if:  You have a weak body defense system (immune system).  You do a lot of swimming or diving.  You use nasal sprays too much.  You smoke. What are the signs or symptoms? The main symptoms of this condition are pain and a feeling of pressure around the sinuses. Other symptoms include:  Stuffy nose (congestion).  Runny nose (drainage).  Swelling and warmth in the sinuses.  Headache.  Toothache.  A cough that may get worse at night.  Mucus that collects in the throat or the back of the nose (postnasal drip).  Being unable to smell and taste.  Being very tired (fatigue).  A fever.  Sore throat.  Bad breath. How is this diagnosed? This condition is diagnosed based on:  Your symptoms.  Your medical history.  A physical exam.  Tests to find out if your condition is short-term (acute) or long-term (chronic). Your doctor may: ? Check your nose for growths (polyps). ? Check your sinuses using a tool that has a light (endoscope). ? Check for allergies or germs. ? Do  imaging tests, such as an MRI or CT scan. How is this treated? Treatment for this condition depends on the cause and whether it is short-term or long-term.  If caused by a virus, your symptoms should go away on their own within 10 days. You may be given medicines to relieve symptoms. They include: ? Medicines that shrink swollen tissue in the nose. ? Medicines that treat allergies (antihistamines). ? A spray that treats swelling of the nostrils. ? Rinses that help get rid of thick mucus in your nose (nasal saline washes).  If caused by bacteria, your doctor may wait to see if you will get better without treatment. You may be given antibiotic medicine if you have: ? A very bad infection. ? A weak body defense system.  If caused by growths in the nose, you may need to have surgery. Follow these instructions at home: Medicines  Take, use, or apply over-the-counter and prescription medicines only as told by your doctor. These may include nasal sprays.  If you were prescribed an antibiotic medicine, take it as told by your doctor. Do not stop taking the antibiotic even if you start to feel better. Hydrate and humidify   Drink enough water to keep your pee (urine) pale yellow.  Use a cool mist humidifier to keep the humidity level in your home above 50%.  Breathe in steam for 10-15 minutes, 3-4 times a day, or as told by your doctor. You can do  this in the bathroom while a hot shower is running.  Try not to spend time in cool or dry air. Rest  Rest as much as you can.  Sleep with your head raised (elevated).  Make sure you get enough sleep each night. General instructions   Put a warm, moist washcloth on your face 3-4 times a day, or as often as told by your doctor. This will help with discomfort.  Wash your hands often with soap and water. If there is no soap and water, use hand sanitizer.  Do not smoke. Avoid being around people who are smoking (secondhand smoke).  Keep  all follow-up visits as told by your doctor. This is important. Contact a doctor if:  You have a fever.  Your symptoms get worse.  Your symptoms do not get better within 10 days. Get help right away if:  You have a very bad headache.  You cannot stop throwing up (vomiting).  You have very bad pain or swelling around your face or eyes.  You have trouble seeing.  You feel confused.  Your neck is stiff.  You have trouble breathing. Summary  Sinusitis is swelling of your sinuses. Sinuses are hollow spaces in the bones around your face.  This condition is caused by tissues in your nose that become inflamed or swollen. This traps germs. These can lead to infection.  If you were prescribed an antibiotic medicine, take it as told by your doctor. Do not stop taking it even if you start to feel better.  Keep all follow-up visits as told by your doctor. This is important. This information is not intended to replace advice given to you by your health care provider. Make sure you discuss any questions you have with your health care provider. Document Released: 02/04/2008 Document Revised: 01/18/2018 Document Reviewed: 01/18/2018 Elsevier Interactive Patient Education  2019 [employer].

## 2018-10-22 NOTE — Telephone Encounter (Signed)
FYI

## 2018-10-22 NOTE — Addendum Note (Signed)
Addended by: Neta Ehlers on: 10/22/2018 01:35 PM   Modules accepted: Orders

## 2018-10-22 NOTE — Telephone Encounter (Signed)
FYI Patient states she was diagnosed with flu on last week and the symptoms are coming back in she wants to know if there is anything she can take.  Patient states she was prescribed comfort measures for flu treatment- cough medication and inhaler and antinausea medication.  Cough has improved with treatment. Patient has started sneezing, mucus is turning yellow,and sore throat. 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  Pt has an appt today with Philis Nettle FNP

## 2018-10-26 ENCOUNTER — Other Ambulatory Visit: Payer: Self-pay | Admitting: Family Medicine

## 2018-10-26 NOTE — Telephone Encounter (Signed)
Copied from [place] [id number]. Topic: Quick Communication - Rx Refill/Question >> Oct 26, 2018 12:32 PM Rayann Heman wrote: Medication: methocarbamol (ROBAXIN) 500 MG tablet [[id number]] gabapentin (NEURONTIN) 300 MG capsule [[id number] would like to know if lauren guse could take over medications   Has the patient contacted their pharmacy? no Preferred Pharmacy (with phone number or street name): [place], [place] - [address] [phone] (Phone) [phone] (Fax)   Agent: Please be advised that RX refills may take up to 3 business days. We ask that you follow-up with your pharmacy.

## 2018-10-26 NOTE — Progress Notes (Signed)
Patient's Name: Anita Gibbs  MRN: [id number]  Referring Provider: Jodelle Green, FNP  DOB: [birth year]  PCP: Jodelle Green, FNP  DOS: 10/27/2018  Note by: Gaspar Cola, MD  Service setting: Ambulatory outpatient  Specialty: Interventional Pain Management  Location: [facility] (AMB) Pain Management Facility    Patient type: Established   Primary Reason(s) for Visit: Encounter for post-procedure evaluation of chronic illness with mild to moderate exacerbation CC: Neck Pain  HPI  Anita Gibbs is a 48 y.o. year old, female patient, who comes today for a post-procedure evaluation. She has Cervical myelopathy ([place]); Essential hypertension; Chronic gout of foot (Left); Numbness and tingling of both feet; Hyperkalemia; Chronic low back pain [facility] Area of Pain) (Bilateral) (R>L) w/o sciatica; Chronic neck pain (Primary Area of Pain) (Bilateral) (L>R); Chronic low back pain [facility] Area of Pain) (Bilateral) (R>L) w/ sciatica (Bilateral); Chronic lower extremity pain (Fourth Area of Pain) (Bilateral) (R>L); Chronic pain syndrome; Pharmacologic therapy; Disorder of skeletal system; Problems influencing health status; Chronic sacroiliac joint pain (Right); Elevated sed rate; Chronic upper extremity pain (Secondary Area of Pain) (Bilateral) (R>L); Cervicalgia (Primary Area of Pain) (Bilateral) (L>R); History of fusion of cervical spine (ACDF C4-C7); Abnormal MRI, cervical spine (06/10/2018); 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-5, C5-6, and C6-7); Cervical facet joint syndrome (Bilateral) (L>R); Cervical facet hypertrophy (C3-T1); 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 Her primarily concern today is the Neck Pain  Pain Assessment: Location: Lower Neck Radiating: Pain radiaties down neck to lower back Onset: More than a month ago Duration: Chronic pain Quality: Sharp, Nagging, Constant Severity: 9 /10 (subjective, self-reported pain score)  Note: Reported level is inconsistent with clinical observations. Clinically the patient looks like a 3/10 A 3/10 is viewed as "Moderate" and described as significantly interfering with activities of daily living (ADL). It becomes difficult to feed, bathe, get dressed, get on and off the toilet or to perform personal hygiene functions. Difficult to get in and out of bed or a chair without assistance. Very distracting. With effort, it can be ignored when deeply involved in activities. Information on the proper use of the pain scale provided to the patient today. When using our objective Pain Scale, levels between 6 and 10/10 are said to belong in an emergency room, as it progressively worsens from a 6/10, described as severely limiting, requiring emergency care not usually available at an outpatient pain management facility. At a 6/10 level, communication becomes difficult and requires great effort. Assistance to reach the emergency department may be required. Facial flushing and profuse sweating along with potentially dangerous increases in heart rate and blood pressure will be evident. Effect on ADL: can't take pain meds during the day because it affects my job, slows me down Timing: Constant Modifying factors: medications, ice and heat BP: 127/71  HR: 66  Anita Gibbs comes in today for post-procedure evaluation.  The patient indicates that after her cervical epidural steroid injection she had 5 days of 100% relief of the pain.  After that, the pain started coming  back and she is now experiencing pain that is somewhat different to what it was before.  At this point, based on the described results, I believe that she still has some remaining swelling in the area and therefore she would benefit from completing the series of 3 cervical epidural steroid injections.  I have explained this to the patient and she agrees.  We will be scheduling the patient to return for her other 2 cervical epidural steroid injection.  Patient has requested for [place] to take over her medication management.  We have done so today.  She has signed a medication agreement with [place] and we have explained to her that she can no longer get any pain medication from anybody else.  We have provided her with written information about our rules and regulations.  Further details on both, my assessment(s), as well as the proposed treatment plan, please see below.  Controlled Substance Pharmacotherapy Assessment REMS (Risk Evaluation and Mitigation Strategy)  Analgesic: Oxycodone IR 5 mg daily. MME/day: 7.5 mg/day.  No notes on file Pharmacokinetics: Liberation and absorption (onset of action): WNL Distribution (time to peak effect): WNL Metabolism and excretion (duration of action): WNL         Pharmacodynamics: Desired effects: Analgesia: Ms. Johndrow reports >50% benefit. Functional ability: Patient reports that medication allows her to accomplish basic ADLs Clinically meaningful improvement in function (CMIF): Sustained CMIF goals met Perceived effectiveness: Described as relatively effective, allowing for increase in activities of daily living (ADL) Undesirable effects: Side-effects or Adverse reactions: None reported Monitoring: [place] PMP: Online review of the past 67-monthperiod conducted. Compliant with practice rules and regulations Last UDS on record: Summary  Date Value Ref Range Status  09/07/2018 FINAL  Final    Comment:     ==================================================================== TOXASSURE COMP DRUG ANALYSIS,UR ==================================================================== Test                             Result       Flag       Units Drug Present and Declared for Prescription Verification   Oxycodone                      220          EXPECTED   ng/mg creat   Oxymorphone                    65           EXPECTED   ng/mg creat   Noroxycodone                   363          EXPECTED   ng/mg creat    Sources of oxycodone include scheduled prescription medications.    Oxymorphone and noroxycodone are expected metabolites of    oxycodone. Oxymorphone is also available as a scheduled    prescription medication.   Gabapentin                     PRESENT      EXPECTED   Methocarbamol                  PRESENT      EXPECTED   Diphenhydramine                PRESENT      EXPECTED Drug Absent but Declared for Prescription Verification   Acetaminophen  Not Detected UNEXPECTED    Acetaminophen, as indicated in the declared medication list, is    not always detected even when used as directed.   Salicylate                     Not Detected UNEXPECTED    Aspirin, as indicated in the declared medication list, is not    always detected even when used as directed.   Naproxen                       Not Detected UNEXPECTED ==================================================================== Test                      Result    Flag   Units      Ref Range   Creatinine              207              mg/dL      >=20 ==================================================================== Declared Medications:  The flagging and interpretation on this report are based on the  following declared medications.  Unexpected results may arise from  inaccuracies in the declared medications.  **Note: The testing scope of this panel includes these medications:  Diphenhydramine (Benadryl)  Gabapentin  Methocarbamol  (Robaxin)  Naproxen (Naprosyn)  Oxycodone  **Note: The testing scope of this panel does not include small to  moderate amounts of these reported medications:  Acetaminophen (Excedrin)  Aspirin (Excedrin)  **Note: The testing scope of this panel does not include following  reported medications:  Albuterol  Caffeine (Excedrin)  Epinephrine (EpiPen)  Famotidine (Pepcid)  Losartan (Cozaar)  Metronidazole (Flagyl) ==================================================================== For clinical consultation, please call [phone]. ====================================================================    UDS interpretation: Compliant          Medication Assessment Form: Reviewed. Patient indicates being compliant with therapy Treatment compliance: Compliant Risk Assessment Profile: Aberrant behavior: See initial evaluations. None observed or detected today Comorbid factors increasing risk of overdose: See initial evaluation. No additional risks detected today Opioid risk tool (ORT):  Opioid Risk  10/27/2018  Alcohol 0  Illegal Drugs 0  Rx Drugs 0  Alcohol 0  Illegal Drugs 0  Rx Drugs 0  Age between 16-45 years  0  History of Preadolescent Sexual Abuse 0  Psychological Disease 0  Depression 0  Opioid Risk Tool Scoring 0  Opioid Risk Interpretation Low Risk    ORT Scoring interpretation table:  Score <3 = Low Risk for SUD  Score between 4-7 = Moderate Risk for SUD  Score >8 = High Risk for Opioid Abuse   Risk of substance use disorder (SUD): Low  Risk Mitigation Strategies:  Patient Counseling: Covered Patient-Prescriber Agreement (PPA): Present and active  Notification to other healthcare providers: Done  Pharmacologic Plan: Today we will take over her medication management.              Post-Procedure Assessment  09/30/2018 Procedure: Diagnostic right-sided cervical epidural steroid injection #1 under fluoroscopic guidance and IV sedation  Pre-procedure pain  score:  9/10 Post-procedure pain score: 0/10 (100% relief) Influential Factors: BMI: 40.39 kg/m Intra-procedural challenges: None observed.         Assessment challenges: None detected.              Reported side-effects: None.        Post-procedural adverse reactions or complications: None reported         Sedation: Sedation provided.  When no sedatives are used, the analgesic levels obtained are directly associated to the effectiveness of the local anesthetics. However, when sedation is provided, the level of analgesia obtained during the initial 1 hour following the intervention, is believed to be the result of a combination of factors. These factors may include, but are not limited to: 1. The effectiveness of the local anesthetics used. 2. The effects of the analgesic(s) and/or anxiolytic(s) used. 3. The degree of discomfort experienced by the patient at the time of the procedure. 4. The patients ability and reliability in recalling and recording the events. 5. The presence and influence of possible secondary gains and/or psychosocial factors. Reported result: Relief experienced during the 1st hour after the procedure: 100 % (Ultra-Short Term Relief) Ms. Mackel has indicated area to have been numb during this time. Interpretative annotation: Clinically appropriate result. Analgesia during this period is likely to be Local Anesthetic and/or IV Sedative (Analgesic/Anxiolytic) related.          Effects of local anesthetic: The analgesic effects attained during this period are directly associated to the localized infiltration of local anesthetics and therefore cary significant diagnostic value as to the etiological location, or anatomical origin, of the pain. Expected duration of relief is directly dependent on the pharmacodynamics of the local anesthetic used. Long-acting (4-6 hours) anesthetics used.  Reported result: Relief during the next 4 to 6 hour after the procedure: 100 % (Short-Term  Relief) Ms. Spader has indicated area to have been numb during this time. Interpretative annotation: Clinically appropriate result. Analgesia during this period is likely to be Local Anesthetic-related.          Long-term benefit: Defined as the period of time past the expected duration of local anesthetics (1 hour for short-acting and 4-6 hours for long-acting). With the possible exception of prolonged sympathetic blockade from the local anesthetics, benefits during this period are typically attributed to, or associated with, other factors such as analgesic sensory neuropraxia, antiinflammatory effects, or beneficial biochemical changes provided by agents other than the local anesthetics.  Reported result: Extended relief following procedure: 100 %(last for 5 days) (Long-Term Relief)            Interpretative annotation: Clinically possible results. Good relief. No permanent benefit expected. Inflammation plays a part in the etiology to the pain.          Current benefits: Defined as reported results that persistent at this point in time.   Analgesia: 0 %            Function: Back to baseline ROM: Back to baseline Interpretative annotation: Recurrence of symptoms. Therapeutic benefit observed. Results would suggest persistent aggravating factors.          Interpretation: Results would suggest a successful diagnostic intervention.                  Plan:  Proceed with treatment No.: 2          Laboratory Chemistry  Inflammation Markers (CRP: Acute Phase) (ESR: Chronic Phase) Lab Results  Component Value Date   CRP 7 09/07/2018   ESRSEDRATE 67 (H) 09/07/2018                         Renal Markers Lab Results  Component Value Date   BUN 11 09/23/2018   CREATININE 0.89 09/23/2018   BCR 13 09/07/2018   GFRAA 76 09/07/2018   GFRNONAA 66 09/07/2018  Hepatic Markers Lab Results  Component Value Date   AST 10 09/07/2018   ALT 10 08/03/2018   ALBUMIN 4.3  09/07/2018                        Note: Lab results reviewed.  Recent Imaging Results   Results for orders placed in visit on 09/28/18  DG C-Arm 1-60 Min-No Report   Narrative Fluoroscopy was utilized by the requesting physician.  No radiographic  interpretation.    Interpretation Report: Fluoroscopy was used during the procedure to assist with needle guidance. The images were interpreted intraoperatively by the requesting physician.  Meds   Current Outpatient Medications:  .  albuterol (PROVENTIL HFA;VENTOLIN HFA) 108 (90 Base) MCG/ACT inhaler, Inhale 2 puffs into the lungs every 6 (six) hours as needed for wheezing or shortness of breath., Disp: 1 Inhaler, Rfl: 5 .  amLODipine (NORVASC) 5 MG tablet, Take 1 tablet (5 mg total) by mouth daily., Disp: 90 tablet, Rfl: 1 .  amoxicillin-clavulanate (AUGMENTIN) 875-125 MG tablet, Take 1 tablet by mouth 2 (two) times daily., Disp: 20 tablet, Rfl: 0 .  aspirin-acetaminophen-caffeine (EXCEDRIN MIGRAINE) 250-250-65 MG tablet, Take 2 tablets by mouth every 6 (six) hours as needed for headache., Disp: , Rfl:  .  calcium carbonate (CALCIUM 600) 600 MG TABS tablet, Take 1 tablet (600 mg total) by mouth 2 (two) times daily with a meal for 30 days., Disp: 60 tablet, Rfl: 0 .  chlorpheniramine-HYDROcodone (TUSSIONEX PENNKINETIC ER) 10-8 MG/5ML SUER, Take 5 mLs by mouth every 12 (twelve) hours as needed., Disp: 140 mL, Rfl: 0 .  Cholecalciferol (VITAMIN D3) 125 MCG (5000 UT) CAPS, Take 1 capsule (5,000 Units total) by mouth daily with breakfast. Take along with calcium and magnesium., Disp: 30 capsule, Rfl: 5 .  diphenhydrAMINE (BENADRYL) 25 mg capsule, Take 25 mg by mouth every 6 (six) hours as needed (for allergies.)., Disp: , Rfl:  .  EPINEPHrine (AUVI-Q) 0.3 mg/0.3 mL IJ SOAJ injection, Use as directed for severe allergic reaction, Disp: 2 Device, Rfl: 2 .  ergocalciferol (VITAMIN D2) 1.25 MG (50000 UT) capsule, Take 1 capsule (50,000 Units total) by  mouth 2 (two) times a week. X 6 weeks., Disp: 12 capsule, Rfl: 0 .  famotidine (PEPCID) 20 MG tablet, Take 1 tablet (20 mg total) by mouth 2 (two) times daily., Disp: 60 tablet, Rfl: 2 .  fluticasone (FLONASE) 50 MCG/ACT nasal spray, Place 2 sprays into both nostrils daily., Disp: 16 g, Rfl: 6 .  gabapentin (NEURONTIN) 300 MG capsule, Take 1-3 capsules (300-900 mg total) by mouth 3 (three) times daily for 30 days. Follow written titration instructions., Disp: 270 capsule, Rfl: 0 .  losartan (COZAAR) 100 MG tablet, Take 1 tablet (100 mg total) by mouth daily., Disp: 90 tablet, Rfl: 3 .  Magnesium 500 MG CAPS, Take 1 capsule (500 mg total) by mouth 2 (two) times daily at 8 am and 10 pm., Disp: 60 capsule, Rfl: 5 .  methocarbamol (ROBAXIN) 500 MG tablet, Take 1 tablet (500 mg total) by mouth every 6 (six) hours as needed for up to 30 days for muscle spasms., Disp: 90 tablet, Rfl: 0 .  montelukast (SINGULAIR) 10 MG tablet, Take 1 tablet (10 mg total) by mouth at bedtime., Disp: 30 tablet, Rfl: 4 .  ondansetron (ZOFRAN ODT) 4 MG disintegrating tablet, Take 1 tablet (4 mg total) by mouth every 8 (eight) hours as needed for nausea or vomiting., Disp: 20 tablet, Rfl:  0 .  oxyCODONE (OXY IR/ROXICODONE) 5 MG immediate release tablet, Take 1 tablet (5 mg total) by mouth daily for 30 days. Must last 30 days., Disp: 30 tablet, Rfl: 0  ROS  Constitutional: Denies any fever or chills Gastrointestinal: No reported hemesis, hematochezia, vomiting, or acute GI distress Musculoskeletal: Denies any acute onset joint swelling, redness, loss of ROM, or weakness Neurological: No reported episodes of acute onset apraxia, aphasia, dysarthria, agnosia, amnesia, paralysis, loss of coordination, or loss of consciousness  Allergies  Ms. Lokken is allergic to other; shellfish allergy; strawberry (diagnostic); and tomato.  PFSH  Drug: Ms. Adler  reports no history of drug use. Alcohol:  reports current alcohol  use. Tobacco:  reports that she has never smoked. She has never used smokeless tobacco. Medical:  has a past medical history of Arthritis, Carpal boss, right, Carpal tunnel syndrome, bilateral, Headache, and Hypertension. Surgical: Ms. Beaudin  has a past surgical history that includes Knee arthroscopy (Right) and Anterior cervical decomp/discectomy fusion (N/A, 07/01/2017). Family: family history includes [name] in her mother; Arthritis in her father, mother, and sister; Breast cancer in her maternal aunt and paternal aunt; COPD in her brother and son; Cancer in her brother; Depression in her son; Diabetes in her mother; Early death in her brother; Hearing loss in her father; Hyperlipidemia in her mother; Hypertension in her father, mother, and sister; Kidney disease in her father.  Constitutional Exam  General appearance: Well nourished, well developed, and well hydrated. In no apparent acute distress Vitals:   10/27/18 0926  BP: 127/71  Pulse: 66  Temp: 98.3 F (36.8 C)  SpO2: 100%  Weight: 228 lb (103.4 kg)  Height: '5\' 3"'  (1.6 m)   BMI Assessment: Estimated body mass index is 40.39 kg/m as calculated from the following:   Height as of this encounter: '5\' 3"'  (1.6 m).   Weight as of this encounter: 228 lb (103.4 kg).  BMI interpretation table: BMI level Category Range association with higher incidence of chronic pain  <18 kg/m2 Underweight   18.5-24.9 kg/m2 Ideal body weight   25-29.9 kg/m2 Overweight Increased incidence by 20%  30-34.9 kg/m2 Obese (Class I) Increased incidence by 68%  35-39.9 kg/m2 Severe obesity (Class II) Increased incidence by 136%  >40 kg/m2 Extreme obesity (Class III) Increased incidence by 254%   Patient's current BMI Ideal Body weight  Body mass index is 40.39 kg/m. Ideal body weight: 52.4 kg (115 lb 8.3 oz) Adjusted ideal body weight: 72.8 kg (160 lb 8.2 oz)   BMI Readings from Last 4 Encounters:  10/27/18 40.39 kg/m  10/22/18 40.39 kg/m   10/12/18 39.79 kg/m  10/08/18 39.08 kg/m   Wt Readings from Last 4 Encounters:  10/27/18 228 lb (103.4 kg)  10/22/18 228 lb (103.4 kg)  10/12/18 224 lb 9.6 oz (101.9 kg)  10/08/18 220 lb 9.6 oz (100.1 kg)  Psych/Mental status: Alert, oriented x 3 (person, place, & time)       Eyes: PERLA Respiratory: No evidence of acute respiratory distress  Cervical Spine Area Exam  Skin & Axial Inspection: No masses, redness, edema, swelling, or associated skin lesions Alignment: Symmetrical Functional ROM: Decreased ROM      Stability: No instability detected Muscle Tone/Strength: Guarding observed Sensory (Neurological): Movement-associated pain Palpation: Complains of area being tender to palpation              Upper Extremity (UE) Exam    Side: Right upper extremity  Side: Left upper extremity  Skin & Extremity  Inspection: Skin color, temperature, and hair growth are WNL. No peripheral edema or cyanosis. No masses, redness, swelling, asymmetry, or associated skin lesions. No contractures.  Skin & Extremity Inspection: Skin color, temperature, and hair growth are WNL. No peripheral edema or cyanosis. No masses, redness, swelling, asymmetry, or associated skin lesions. No contractures.  Functional ROM: Unrestricted ROM          Functional ROM: Unrestricted ROM          Muscle Tone/Strength: Functionally intact. No obvious neuro-muscular anomalies detected.  Muscle Tone/Strength: Functionally intact. No obvious neuro-muscular anomalies detected.  Sensory (Neurological): Unimpaired          Sensory (Neurological): Unimpaired          Palpation: No palpable anomalies              Palpation: No palpable anomalies              Provocative Test(s):  Phalen's test: deferred Tinel's test: deferred Apley's scratch test (touch opposite shoulder):  Action 1 (Across chest): deferred Action 2 (Overhead): deferred Action 3 (LB reach): deferred   Provocative Test(s):  Phalen's test: deferred Tinel's  test: deferred Apley's scratch test (touch opposite shoulder):  Action 1 (Across chest): deferred Action 2 (Overhead): deferred Action 3 (LB reach): deferred    Thoracic Spine Area Exam  Skin & Axial Inspection: No masses, redness, or swelling Alignment: Symmetrical Functional ROM: Unrestricted ROM Stability: No instability detected Muscle Tone/Strength: Functionally intact. No obvious neuro-muscular anomalies detected. Sensory (Neurological): Unimpaired Muscle strength & Tone: No palpable anomalies  Lumbar Spine Area Exam  Skin & Axial Inspection: No masses, redness, or swelling Alignment: Symmetrical Functional ROM: Unrestricted ROM       Stability: No instability detected Muscle Tone/Strength: Functionally intact. No obvious neuro-muscular anomalies detected. Sensory (Neurological): Unimpaired Palpation: No palpable anomalies       Provocative Tests: Hyperextension/rotation test: deferred today       Lumbar quadrant test (Kemp's test): deferred today       Lateral bending test: deferred today       Patrick's Maneuver: deferred today                   FABER* test: deferred today                   S-I anterior distraction/compression test: deferred today         S-I lateral compression test: deferred today         S-I Thigh-thrust test: deferred today         S-I Gaenslen's test: deferred today         *(Flexion, ABduction and External Rotation)  Gait & Posture Assessment  Ambulation: Unassisted Gait: Relatively normal for age and body habitus Posture: WNL   Lower Extremity Exam    Side: Right lower extremity  Side: Left lower extremity  Stability: No instability observed          Stability: No instability observed          Skin & Extremity Inspection: Skin color, temperature, and hair growth are WNL. No peripheral edema or cyanosis. No masses, redness, swelling, asymmetry, or associated skin lesions. No contractures.  Skin & Extremity Inspection: Skin color, temperature,  and hair growth are WNL. No peripheral edema or cyanosis. No masses, redness, swelling, asymmetry, or associated skin lesions. No contractures.  Functional ROM: Unrestricted ROM  Functional ROM: Unrestricted ROM                  Muscle Tone/Strength: Functionally intact. No obvious neuro-muscular anomalies detected.  Muscle Tone/Strength: Functionally intact. No obvious neuro-muscular anomalies detected.  Sensory (Neurological): Unimpaired        Sensory (Neurological): Unimpaired        DTR: Patellar: deferred today Achilles: deferred today Plantar: deferred today  DTR: Patellar: deferred today Achilles: deferred today Plantar: deferred today  Palpation: No palpable anomalies  Palpation: No palpable anomalies   Assessment   Status Diagnosis  Persistent Persistent Improved 1. Cervicalgia (Primary Area of Pain) (Bilateral) (L>R)   2. Chronic neck pain (Primary Area of Pain) (Bilateral) (L>R)   3. Chronic upper extremity pain (Secondary Area of Pain) (Bilateral) (R>L)   4. Chronic low back pain [place] [facility] Area of Pain) (Bilateral) (R>L) w/ sciatica (Bilateral)   5. Chronic lower extremity pain (Fourth Area of Pain) (Bilateral) (R>L)   6. 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)   7. DDD (degenerative disc disease), cervical   8. Neurogenic pain   9. Chronic musculoskeletal pain   10. Chronic pain syndrome      Updated Problems: Problem  Neurogenic Pain  Chronic Musculoskeletal Pain    Plan of Care  Pharmacotherapy (Medications Ordered): Meds ordered this encounter  Medications  . gabapentin (NEURONTIN) 300 MG capsule    Sig: Take 1-3 capsules (300-900 mg total) by mouth 3 (three) times daily for 30 days. Follow written titration instructions.    Dispense:  270 capsule    Refill:  0    Do not place medication on "Automatic Refill". Fill one day early if pharmacy is closed on scheduled refill date.  . methocarbamol (ROBAXIN) 500 MG tablet    Sig: Take 1  tablet (500 mg total) by mouth every 6 (six) hours as needed for up to 30 days for muscle spasms.    Dispense:  90 tablet    Refill:  0    Do not place medication on "Automatic Refill". Fill one day early if pharmacy is closed on scheduled refill date.  Marland Kitchen oxyCODONE (OXY IR/ROXICODONE) 5 MG immediate release tablet    Sig: Take 1 tablet (5 mg total) by mouth daily for 30 days. Must last 30 days.    Dispense:  30 tablet    Refill:  0   Medications administered today: Lashaya C. Musa had no medications administered during this visit.   Procedure Orders     Cervical Epidural Injection Lab Orders  No laboratory test(s) ordered today   Imaging Orders  No imaging studies ordered today   Referral Orders  No referral(s) requested today   Interventional management options: Planned, scheduled, and/or pending:   Diagnostic right-sided cervical ESI #2 under fluoroscopic guidance and IV sedation. Today I will take over her meds.   Considering:   Diagnostic midline CESI  Diagnostic bilateral cervicalfacet nerve block  Possible bilateral cervical facet RFA  Diagnostic midline lumbar ESI  Diagnostic bilateral lumbar facet nerve block  Possible bilateral lumbar facet RFA  Diagnostic right-sided sacroiliac joint injection  Possible right-sided sacroiliac joint RFA    Palliative PRN treatment(s):   Diagnostic right CESIs    Provider-requested follow-up: Return for Procedure (w/ sedation): (R) CESI #2.  Future Appointments  Date Time Provider [place]  11/04/2018  9:45 AM Milinda Pointer, MD [facility] None  11/09/2018  9:30 AM Shelton Silvas, PT [facility] None  11/11/2018  9:00 AM Shelton Silvas, PT [facility]  None  11/16/2018  9:45 AM Shelton Silvas, PT [facility] None  11/18/2018  9:00 AM Shelton Silvas, PT [facility] None  11/23/2018  9:45 AM Shelton Silvas, PT [facility] None  11/25/2018  9:00 AM Shelton Silvas, PT [facility] None  11/29/2018  9:00 AM Guse, Jacquelynn Cree, FNP [id number]  [facility]  11/30/2018  9:45 AM Shelton Silvas, PT [facility] None  12/02/2018  9:45 AM Shelton Silvas, PT [facility] None  12/07/2018  9:00 AM Shelton Silvas, PT [facility] None  12/10/2018  9:00 AM Shelton Silvas, PT [facility] None  12/14/2018  9:00 AM Shelton Silvas, PT [facility] None  12/16/2018  9:00 AM Shelton Silvas, PT [facility] None  12/21/2018  9:00 AM Shelton Silvas, PT [facility] None  12/23/2018  9:00 AM Shelton Silvas, PT [facility] None  12/28/2018  9:00 AM Shelton Silvas, PT [facility] None  12/30/2018  9:00 AM Shelton Silvas, PT [facility] None   Primary Care Physician: Jodelle Green, FNP Location: [facility] Note by: Gaspar Cola, MD Date: 10/27/2018; Time: 12:55 PM

## 2018-10-26 NOTE — Telephone Encounter (Signed)
Requested medication (s) are due for refill today - expired per list  Requested medication (s) are on the active medication list -yes  Future visit scheduled - yes  Last refill: Robaxin- 07/02/17                 Gabapentin- historical  Notes to clinic: Patient is requesting PCP take over the medications requested. 1 Rx is a non delegated Rx and the other is a historical medication. Sent for PCP review of request  Requested Prescriptions  Pending Prescriptions Disp Refills   methocarbamol (ROBAXIN) 500 MG tablet 90 tablet 0    Sig: Take 1 tablet (500 mg total) by mouth every 6 (six) hours as needed for muscle spasms.     Not Delegated - Analgesics:  Muscle Relaxants Failed - 10/26/2018 12:35 PM      Failed - This refill cannot be delegated      Passed - Valid encounter within last 6 months    Recent Outpatient Visits          4 days ago Acute sinusitis, recurrence not specified, unspecified location   [place] Guse, Jacquelynn Cree, FNP   2 weeks ago Influenza A   [facility] [place] Guse, Jacquelynn Cree, FNP   2 weeks ago Multiple environmental allergies   [place] Guse, Jacquelynn Cree, FNP   1 month ago Discharge from the [place], FNP   2 months ago [place], FNP      Future Appointments            In 1 month Guse, Jacquelynn Cree, [place], [facility]          gabapentin (NEURONTIN) 300 MG capsule      Sig: Take 1 capsule (300 mg total) by mouth 3 (three) times daily.     Neurology: Anticonvulsants - gabapentin Passed - 10/26/2018 12:35 PM      Passed - Valid encounter within last 12 months    Recent Outpatient Visits          4 days ago Acute sinusitis, recurrence not specified, unspecified location   [place] Guse, Jacquelynn Cree, FNP   2 weeks ago Influenza A   [place]  Guse, Jacquelynn Cree, FNP   2 weeks ago Multiple environmental allergies   [place] Guse, Jacquelynn Cree, FNP   1 month ago Discharge from the [place] Guse, Jacquelynn Cree, FNP   2 months ago [place] Guse, Jacquelynn Cree, FNP      Future Appointments            In 1 month Guse, Jacquelynn Cree, FNP [place], [facility]            Requested Prescriptions  Pending Prescriptions Disp Refills   methocarbamol (ROBAXIN) 500 MG tablet 90 tablet 0    Sig: Take 1 tablet (500 mg total) by mouth every 6 (six) hours as needed for muscle spasms.     Not Delegated - Analgesics:  Muscle Relaxants Failed - 10/26/2018 12:35 PM      Failed - This refill cannot be delegated      Passed - Valid encounter within last 6 months    Recent Outpatient Visits  4 days ago Acute sinusitis, recurrence not specified, unspecified location   [place] Guse, Jacquelynn Cree, FNP   2 weeks ago Influenza A   [facility] [place] Guse, Jacquelynn Cree, FNP   2 weeks ago Multiple environmental allergies   [place] Guse, Jacquelynn Cree, FNP   1 month ago Discharge from the vagina   [place], FNP   2 months ago [place] Guse, Jacquelynn Cree, FNP      Future Appointments            In 1 month Guse, Jacquelynn Cree, [place], [facility]          gabapentin (NEURONTIN) 300 MG capsule      Sig: Take 1 capsule (300 mg total) by mouth 3 (three) times daily.     Neurology: Anticonvulsants - gabapentin Passed - 10/26/2018 12:35 PM      Passed - Valid encounter within last 12 months    Recent Outpatient Visits          4 days ago Acute sinusitis, recurrence not specified, unspecified location   [place] Guse, Jacquelynn Cree, FNP   2 weeks ago Influenza A   [place] Guse, Jacquelynn Cree, FNP   2 weeks ago Multiple environmental allergies   [place] Guse, Jacquelynn Cree, FNP   1 month ago Discharge from the [place] Guse, Jacquelynn Cree, FNP   2 months ago [place] Guse, Jacquelynn Cree, FNP      Future Appointments            In 1 month Guse, Jacquelynn Cree, [place], [facility]

## 2018-10-27 ENCOUNTER — Other Ambulatory Visit: Payer: Self-pay

## 2018-10-27 ENCOUNTER — Ambulatory Visit: Payer: BC Managed Care – PPO | Attending: Pain Medicine | Admitting: Pain Medicine

## 2018-10-27 ENCOUNTER — Encounter: Payer: Self-pay | Admitting: Pain Medicine

## 2018-10-27 VITALS — BP 127/71 | HR 66 | Temp 98.3°F | Ht 63.0 in | Wt 228.0 lb

## 2018-10-27 DIAGNOSIS — M7918 Myalgia, other site: Secondary | ICD-10-CM

## 2018-10-27 DIAGNOSIS — G8929 Other chronic pain: Secondary | ICD-10-CM | POA: Insufficient documentation

## 2018-10-27 DIAGNOSIS — M79604 Pain in right leg: Secondary | ICD-10-CM | POA: Diagnosis present

## 2018-10-27 DIAGNOSIS — M79605 Pain in left leg: Secondary | ICD-10-CM

## 2018-10-27 DIAGNOSIS — M5441 Lumbago with sciatica, right side: Secondary | ICD-10-CM | POA: Diagnosis present

## 2018-10-27 DIAGNOSIS — M4802 Spinal stenosis, cervical region: Secondary | ICD-10-CM

## 2018-10-27 DIAGNOSIS — M542 Cervicalgia: Secondary | ICD-10-CM | POA: Diagnosis present

## 2018-10-27 DIAGNOSIS — M792 Neuralgia and neuritis, unspecified: Secondary | ICD-10-CM

## 2018-10-27 DIAGNOSIS — G894 Chronic pain syndrome: Secondary | ICD-10-CM | POA: Diagnosis present

## 2018-10-27 DIAGNOSIS — M79601 Pain in right arm: Secondary | ICD-10-CM | POA: Diagnosis present

## 2018-10-27 DIAGNOSIS — M79602 Pain in left arm: Secondary | ICD-10-CM

## 2018-10-27 DIAGNOSIS — M5030 Other cervical disc degeneration, unspecified cervical region: Secondary | ICD-10-CM | POA: Diagnosis present

## 2018-10-27 DIAGNOSIS — M5442 Lumbago with sciatica, left side: Secondary | ICD-10-CM | POA: Diagnosis present

## 2018-10-27 HISTORY — DX: Myalgia, other site: M79.18

## 2018-10-27 HISTORY — DX: Neuralgia and neuritis, unspecified: M79.2

## 2018-10-27 MED ORDER — GABAPENTIN 300 MG PO CAPS: 300.0000 mg | ORAL_CAPSULE | Freq: Three times a day (TID) | ORAL | 0 refills | Status: DC

## 2018-10-27 MED ORDER — OXYCODONE HCL 5 MG PO TABS: 5.0000 mg | ORAL_TABLET | Freq: Every day | ORAL | 0 refills | Status: DC

## 2018-10-27 MED ORDER — METHOCARBAMOL 500 MG PO TABS: 500.0000 mg | ORAL_TABLET | Freq: Four times a day (QID) | ORAL | 0 refills | Status: DC | PRN

## 2018-10-27 NOTE — Patient Instructions (Addendum)
______________________________________________________________________________________________  Specialty Pain Scale  Introduction:  There are significant differences in how pain is reported. The word pain usually refers to physical pain, but it is also a common synonym of suffering. The medical community uses a scale from 0 (zero) to 10 (ten) to report pain level. Zero (0) is described as "no pain", while ten (10) is described as "the worse pain you can imagine". The problem with this scale is that physical pain is reported along with suffering. Suffering refers to mental pain, or more often yet it refers to any unpleasant feeling, emotion or aversion associated with the perception of harm or threat of harm. It is the psychological component of pain.  Pain Specialists prefer to separate the two components. The pain scale used by this practice is the Verbal Numerical Rating Scale (VNRS-11). This scale is for the physical pain only. DO NOT INCLUDE how your pain psychologically affects you. This scale is for adults 21 years of age and older. It has 11 (eleven) levels. The 1st level is 0/10. This means: "right now, I have no pain". In the context of pain management, it also means: "right now, my physical pain is under control with the current therapy".  General Information:  The scale should reflect your current level of pain. Unless you are specifically asked for the level of your worst pain, or your average pain. If you are asked for one of these two, then it should be understood that it is over the past 24 hours.  Levels 1 (one) through 5 (five) are described below, and can be treated as an outpatient. Ambulatory pain management facilities such as ours are more than adequate to treat these levels. Levels 6 (six) through 10 (ten) are also described below, however, these must be treated as a hospitalized patient. While levels 6 (six) and 7 (seven) may be evaluated at an urgent care facility, levels 8  (eight) through 10 (ten) constitute medical emergencies and as such, they belong in a hospital's emergency department. When having these levels (as described below), do not come to our office. Our facility is not equipped to manage these levels. Go directly to an urgent care facility or an emergency department to be evaluated.  Definitions:  Activities of Daily Living (ADL): Activities of daily living (ADL or ADLs) is a term used in healthcare to refer to people's daily self-care activities. Health professionals often use a person's ability or inability to perform ADLs as a measurement of their functional status, particularly in regard to people post injury, with disabilities and the elderly. There are two ADL levels: Basic and Instrumental. Basic Activities of Daily Living (BADL  or BADLs) consist of self-care tasks that include: Bathing and showering; personal hygiene and grooming (including brushing/combing/styling hair); dressing; Toilet hygiene (getting to the toilet, cleaning oneself, and getting back up); eating and self-feeding (not including cooking or chewing and swallowing); functional mobility, often referred to as "transferring", as measured by the ability to walk, get in and out of bed, and get into and out of a chair; the broader definition (moving from one place to another while performing activities) is useful for people with different physical abilities who are still able to get around independently. Basic ADLs include the things many people do when they get up in the morning and get ready to go out of the house: get out of bed, go to the toilet, bathe, dress, groom, and eat. On the average, loss of function typically follows a particular order.   Hygiene is the first to go, followed by loss of toilet use and locomotion. The last to go is the ability to eat. When there is only one remaining area in which the person is independent, there is a 62.9% chance that it is eating and only a 3.5% chance  that it is hygiene. Instrumental Activities of Daily Living (IADL or IADLs) are not necessary for fundamental functioning, but they let an individual live independently in a community. IADL consist of tasks that include: cleaning and maintaining the house; home establishment and maintenance; care of others (including selecting and supervising caregivers); care of pets; child rearing; managing money; managing financials (investments, etc.); meal preparation and cleanup; shopping for groceries and necessities; moving within the community; safety procedures and emergency responses; health management and maintenance (taking prescribed medications); and using the telephone or other form of communication.  Instructions:  Most patients tend to report their pain as a combination of two factors, their physical pain and their psychosocial pain. This last one is also known as "suffering" and it is reflection of how physical pain affects you socially and psychologically. From now on, report them separately.  From this point on, when asked to report your pain level, report only your physical pain. Use the following table for reference.  Pain Clinic Pain Levels (0-5/10)  Pain Level Score  Description  No Pain 0   Mild pain 1 Nagging, annoying, but does not interfere with basic activities of daily living (ADL). Patients are able to eat, bathe, get dressed, toileting (being able to get on and off the toilet and perform personal hygiene functions), transfer (move in and out of bed or a chair without assistance), and maintain continence (able to control bladder and bowel functions). Blood pressure and heart rate are unaffected. A normal heart rate for a healthy adult ranges from 60 to 100 bpm (beats per minute).   Mild to moderate pain 2 Noticeable and distracting. Impossible to hide from other people. More frequent flare-ups. Still possible to adapt and function close to normal. It can be very annoying and may have  occasional stronger flare-ups. With discipline, patients may get used to it and adapt.   Moderate pain 3 Interferes significantly with activities of daily living (ADL). It becomes difficult to feed, bathe, get dressed, get on and off the toilet or to perform personal hygiene functions. Difficult to get in and out of bed or a chair without assistance. Very distracting. With effort, it can be ignored when deeply involved in activities.   Moderately severe pain 4 Impossible to ignore for more than a few minutes. With effort, patients may still be able to manage work or participate in some social activities. Very difficult to concentrate. Signs of autonomic nervous system discharge are evident: dilated pupils (mydriasis); mild sweating (diaphoresis); sleep interference. Heart rate becomes elevated (>115 bpm). Diastolic blood pressure (lower number) rises above 100 mmHg. Patients find relief in laying down and not moving.   Severe pain 5 Intense and extremely unpleasant. Associated with frowning face and frequent crying. Pain overwhelms the senses.  Ability to do any activity or maintain social relationships becomes significantly limited. Conversation becomes difficult. Pacing back and forth is common, as getting into a comfortable position is nearly impossible. Pain wakes you up from deep sleep. Physical signs will be obvious: pupillary dilation; increased sweating; goosebumps; brisk reflexes; cold, clammy hands and feet; nausea, vomiting or dry heaves; loss of appetite; significant sleep disturbance with inability to fall asleep or to   remain asleep. When persistent, significant weight loss is observed due to the complete loss of appetite and sleep deprivation.  Blood pressure and heart rate becomes significantly elevated. Caution: If elevated blood pressure triggers a pounding headache associated with blurred vision, then the patient should immediately seek attention at an urgent or emergency care unit, as  these may be signs of an impending stroke.    Emergency Department Pain Levels (6-10/10)  Emergency Room Pain 6 Severely limiting. Requires emergency care and should not be seen or managed at an outpatient pain management facility. Communication becomes difficult and requires great effort. Assistance to reach the emergency department may be required. Facial flushing and profuse sweating along with potentially dangerous increases in heart rate and blood pressure will be evident.   Distressing pain 7 Self-care is very difficult. Assistance is required to transport, or use restroom. Assistance to reach the emergency department will be required. Tasks requiring coordination, such as bathing and getting dressed become very difficult.   Disabling pain 8 Self-care is no longer possible. At this level, pain is disabling. The individual is unable to do even the most "basic" activities such as walking, eating, bathing, dressing, transferring to a bed, or toileting. Fine motor skills are lost. It is difficult to think clearly.   Incapacitating pain 9 Pain becomes incapacitating. Thought processing is no longer possible. Difficult to remember your own name. Control of movement and coordination are lost.   The worst pain imaginable 10 At this level, most patients pass out from pain. When this level is reached, collapse of the autonomic nervous system occurs, leading to a sudden drop in blood pressure and heart rate. This in turn results in a temporary and dramatic drop in blood flow to the brain, leading to a loss of consciousness. Fainting is one of the body's self defense mechanisms. Passing out puts the brain in a calmed state and causes it to shut down for a while, in order to begin the healing process.    Summary: 1. Refer to this scale when providing [place] with your pain level. 2. Be accurate and careful when reporting your pain level. This will help with your care. 3. Over-reporting your pain level will  lead to loss of credibility. 4. Even a level of 1/10 means that there is pain and will be treated at our facility. 5. High, inaccurate reporting will be documented as "Symptom Exaggeration", leading to loss of credibility and suspicions of possible secondary gains such as obtaining more narcotics, or wanting to appear disabled, for fraudulent reasons. 6. Only pain levels of 5 or below will be seen at our facility. 7. Pain levels of 6 and above will be sent to the Emergency Department and the appointment cancelled. ______________________________________________________________________________________________   ____________________________________________________________________________________________  Preparing for Procedure with Sedation  Instructions: . Oral Intake: Do not eat or drink anything for at least 8 hours prior to your procedure. . Transportation: Public transportation is not allowed. Bring an adult driver. The driver must be physically present in our waiting room before any procedure can be started. Marland Kitchen Physical Assistance: Bring an adult physically capable of assisting you, in the event you need help. This adult should keep you company at home for at least 6 hours after the procedure. . Blood Pressure Medicine: Take your blood pressure medicine with a sip of water the morning of the procedure. . Blood thinners: Notify our staff if you are taking any blood thinners. Depending on which one you take, there will be  specific instructions on how and when to stop it. . Diabetics on insulin: Notify the staff so that you can be scheduled 1st case in the morning. If your diabetes requires high dose insulin, take only  of your normal insulin dose the morning of the procedure and notify the staff that you have done so. . Preventing infections: Shower with an antibacterial soap the morning of your procedure. . Build-up your immune system: Take 1000 mg of Vitamin C with every meal (3 times a day)  the day prior to your procedure. Marland Kitchen Antibiotics: Inform the staff if you have a condition or reason that requires you to take antibiotics before dental procedures. . Pregnancy: If you are pregnant, call and cancel the procedure. . Sickness: If you have a cold, fever, or any active infections, call and cancel the procedure. . Arrival: You must be in the facility at least 30 minutes prior to your scheduled procedure. . Children: Do not bring children with you. . Dress appropriately: Bring dark clothing that you would not mind if they get stained. . Valuables: Do not bring any jewelry or valuables.  Procedure appointments are reserved for interventional treatments only. Marland Kitchen No Prescription Refills. . No medication changes will be discussed during procedure appointments. . No disability issues will be discussed.  Reasons to call and reschedule or cancel your procedure: (Following these recommendations will minimize the risk of a serious complication.) . Surgeries: Avoid having procedures within 2 weeks of any surgery. (Avoid for 2 weeks before or after any surgery). . Flu Shots: Avoid having procedures within 2 weeks of a flu shots or . (Avoid for 2 weeks before or after immunizations). . Barium: Avoid having a procedure within 7-10 days after having had a radiological study involving the use of radiological contrast. (Myelograms, Barium swallow or enema study). . Heart attacks: Avoid any elective procedures or surgeries for the initial 6 months after a "Myocardial Infarction" (Heart Attack). . Blood thinners: It is imperative that you stop these medications before procedures. Let us know if you if you take any blood thinner.  . Infection: Avoid procedures during or within two weeks of an infection (including chest colds or gastrointestinal problems). Symptoms associated with infections include: Localized redness, fever, chills, night sweats or profuse sweating, burning sensation when voiding, cough,  congestion, stuffiness, runny nose, sore throat, diarrhea, nausea, vomiting, cold or Flu symptoms, recent or current infections. It is specially important if the infection is over the area that we intend to treat. Marland Kitchen Heart and lung problems: Symptoms that may suggest an active cardiopulmonary problem include: cough, chest pain, breathing difficulties or shortness of breath, dizziness, ankle swelling, uncontrolled high or unusually low blood pressure, and/or palpitations. If you are experiencing any of these symptoms, cancel your procedure and contact your primary care physician for an evaluation.  Remember:  Regular Business hours are:  Monday to Thursday 8:00 AM to 4:00 PM  Provider's Schedule: Milinda Pointer, MD:  Procedure days: Tuesday and Thursday 7:30 AM to 4:00 PM  Gillis Santa, MD:  Procedure days: Monday and Wednesday 7:30 AM to 4:00 PM ____________________________________________________________________________________________   ____________________________________________________________________________________________  Medication Rules  Purpose: To inform patients, and their family members, of our rules and regulations.  Applies to: All patients receiving prescriptions (written or electronic).  Pharmacy of record: Pharmacy where electronic prescriptions will be sent. If written prescriptions are taken to a different pharmacy, please inform the nursing staff. The pharmacy listed in the electronic medical record should be the one  where you would like electronic prescriptions to be sent.  Electronic prescriptions: In compliance with the [place] (STOP) Act of 2017 (Session Lanny Cramp [phone]), effective September 01, 2018, all controlled substances must be electronically prescribed. Calling prescriptions to the pharmacy will cease to exist.  Prescription refills: Only during scheduled appointments. Applies to all prescriptions.  NOTE: The  following applies primarily to controlled substances (Opioid* Pain Medications).   Patient's responsibilities: 1. Pain Pills: Bring all pain pills to every appointment (except for procedure appointments). 2. Pill Bottles: Bring pills in original pharmacy bottle. Always bring the newest bottle. Bring bottle, even if empty. 3. Medication refills: You are responsible for knowing and keeping track of what medications you take and those you need refilled. The day before your appointment: write a list of all prescriptions that need to be refilled. The day of the appointment: give the list to the admitting nurse. Prescriptions will be written only during appointments. No prescriptions will be written on procedure days. If you forget a medication: it will not be "Called in", "Faxed", or "electronically sent". You will need to get another appointment to get these prescribed. No early refills. Do not call asking to have your prescription filled early. 4. Prescription Accuracy: You are responsible for carefully inspecting your prescriptions before leaving our office. Have the discharge nurse carefully go over each prescription with you, before taking them home. Make sure that your name is accurately spelled, that your address is correct. Check the name and dose of your medication to make sure it is accurate. Check the number of pills, and the written instructions to make sure they are clear and accurate. Make sure that you are given enough medication to last until your next medication refill appointment. 5. Taking Medication: Take medication as prescribed. When it comes to controlled substances, taking less pills or less frequently than prescribed is permitted and encouraged. Never take more pills than instructed. Never take medication more frequently than prescribed.  6. Inform other Doctors: Always inform, all of your healthcare providers, of all the medications you take. 7. Pain Medication from other  Providers: You are not allowed to accept any additional pain medication from any other Doctor or Healthcare provider. There are two exceptions to this rule. (see below) In the event that you require additional pain medication, you are responsible for notifying us, as stated below. 8. Medication Agreement: You are responsible for carefully reading and following our Medication Agreement. This must be signed before receiving any prescriptions from our practice. Safely store a copy of your signed Agreement. Violations to the Agreement will result in no further prescriptions. (Additional copies of our Medication Agreement are available upon request.) 9. Laws, Rules, & Regulations: All patients are expected to follow all Federal and [employer], [employer], Rules, [employer]. Ignorance of the Laws does not constitute a valid excuse. The use of any illegal substances is prohibited. 10. Adopted CDC guidelines & recommendations: Target dosing levels will be at or below 60 MME/day. Use of benzodiazepines** is not recommended.  Exceptions: There are only two exceptions to the rule of not receiving pain medications from other Healthcare Providers. 1. Exception #1 (Emergencies): In the event of an emergency (i.e.: accident requiring emergency care), you are allowed to receive additional pain medication. However, you are responsible for: As soon as you are able, call our office [phone], at any time of the day or night, and leave a message stating your name, the date and nature  of the emergency, and the name and dose of the medication prescribed. In the event that your call is answered by a member of our staff, make sure to document and save the date, time, and the name of the person that took your information.  2. Exception #2 (Planned Surgery): In the event that you are scheduled by another doctor or dentist to have any type of surgery or procedure, you are allowed (for a period no longer than 30 days), to receive  additional pain medication, for the acute post-op pain. However, in this case, you are responsible for picking up a copy of our "Post-op Pain Management for Surgeons" handout, and giving it to your surgeon or dentist. This document is available at our office, and does not require an appointment to obtain it. Simply go to our office during business hours (Monday-Thursday from 8:00 AM to 4:00 PM) (Friday 8:00 AM to 12:00 Noon) or if you have a scheduled appointment with [place], prior to your surgery, and ask for it by name. In addition, you will need to provide [place] with your name, name of your surgeon, type of surgery, and date of procedure or surgery.  *Opioid medications include: morphine, codeine, oxycodone, oxymorphone, hydrocodone, hydromorphone, meperidine, tramadol, tapentadol, buprenorphine, fentanyl, methadone. **Benzodiazepine medications include: diazepam (Valium), alprazolam (Xanax), clonazepam (Klonopine), lorazepam (Ativan), clorazepate (Tranxene), chlordiazepoxide (Librium), estazolam (Prosom), oxazepam (Serax), temazepam (Restoril), triazolam (Halcion) (Last updated: 10/29/2017) ____________________________________________________________________________________________   ____________________________________________________________________________________________  Medication Recommendations and Reminders  Applies to: All patients receiving prescriptions (written and/or electronic).  Medication Rules & Regulations: These rules and regulations exist for your safety and that of others. They are not flexible and neither are we. Dismissing or ignoring them will be considered "non-compliance" with medication therapy, resulting in complete and irreversible termination of such therapy. (See document titled "Medication Rules" for more details.) In all conscience, because of safety reasons, we cannot continue providing a therapy where the patient does not follow instructions.  Pharmacy of record:    Definition: This is the pharmacy where your electronic prescriptions will be sent.   We do not endorse any particular pharmacy.  You are not restricted in your choice of pharmacy.  The pharmacy listed in the electronic medical record should be the one where you want electronic prescriptions to be sent.  If you choose to change pharmacy, simply notify our nursing staff of your choice of new pharmacy.  Recommendations:  Keep all of your pain medications in a safe place, under lock and key, even if you live alone.   After you fill your prescription, take 1 week's worth of pills and put them away in a safe place. You should keep a separate, properly labeled bottle for this purpose. The remainder should be kept in the original bottle. Use this as your primary supply, until it runs out. Once it's gone, then you know that you have 1 week's worth of medicine, and it is time to come in for a prescription refill. If you do this correctly, it is unlikely that you will ever run out of medicine.  To make sure that the above recommendation works, it is very important that you make sure your medication refill appointments are scheduled at least 1 week before you run out of medicine. To do this in an effective manner, make sure that you do not leave the office without scheduling your next medication management appointment. Always ask the nursing staff to show you in your prescription , when your medication will be running  out. Then arrange for the receptionist to get you a return appointment, at least 7 days before you run out of medicine. Do not wait until you have 1 or 2 pills left, to come in. This is very poor planning and does not take into consideration that we may need to cancel appointments due to bad weather, sickness, or emergencies affecting our staff.  "Partial Fill": If for any reason your pharmacy does not have enough pills/tablets to completely fill or refill your prescription, do not allow for  a "partial fill". You will need a separate prescription to fill the remaining amount, which we will not provide. If the reason for the partial fill is your insurance, you will need to talk to the pharmacist about payment alternatives for the remaining tablets, but again, do not accept a partial fill.  Prescription refills and/or changes in medication(s):   Prescription refills, and/or changes in dose or medication, will be conducted only during scheduled medication management appointments. (Applies to both, written and electronic prescriptions.)  No refills on procedure days. No medication will be changed or started on procedure days. No changes, adjustments, and/or refills will be conducted on a procedure day. Doing so will interfere with the diagnostic portion of the procedure.  No phone refills. No medications will be "called into the pharmacy".  No Fax refills.  No weekend refills.  No Holliday refills.  No after hours refills.  Remember:  Business hours are:  Monday to Thursday 8:00 AM to 4:00 PM Provider's Schedule: Dionisio David, NP - Appointments are:  Medication management: Monday to Thursday 8:00 AM to 4:00 PM Milinda Pointer, MD - Appointments are:  Medication management: Monday and Wednesday 8:00 AM to 4:00 PM Procedure day: Tuesday and Thursday 7:30 AM to 4:00 PM Gillis Santa, MD - Appointments are:  Medication management: Tuesday and Thursday 8:00 AM to 4:00 PM Procedure day: Monday and Wednesday 7:30 AM to 4:00 PM (Last update: 10/29/2017) ____________________________________________________________________________________________   ____________________________________________________________________________________________  CANNABIDIOL (AKA: CBD Oil or Pills)  Applies to: All patients receiving prescriptions of controlled substances (written and/or electronic).  General Information: Cannabidiol (CBD) was discovered in [birth year]. It is one of some 113 identified  cannabinoids in cannabis (Marijuana) plants, accounting for up to 40% of the plant's extract. As of 2018, preliminary clinical research on cannabidiol included studies of anxiety, cognition, movement disorders, and pain.  Cannabidiol is consummed in multiple ways, including inhalation of cannabis smoke or vapor, as an aerosol spray into the cheek, and by mouth. It may be supplied as CBD oil containing CBD as the active ingredient (no added tetrahydrocannabinol (THC) or terpenes), a full-plant CBD-dominant hemp extract oil, capsules, dried cannabis, or as a liquid solution. CBD is thought not have the same psychoactivity as THC, and may affect the actions of THC. Studies suggest that CBD may interact with different biological targets, including cannabinoid receptors and other neurotransmitter receptors. As of 2018 the mechanism of action for its biological effects has not been determined.  In the [place], cannabidiol has a limited approval by the Food and Drug Administration (FDA) for treatment of only two types of epilepsy disorders. The side effects of long-term use of the drug include somnolence, decreased appetite, diarrhea, fatigue, malaise, weakness, sleeping problems, and others.  CBD remains a Schedule I drug prohibited for any use.  Legality: Some manufacturers ship CBD products nationally, an illegal action which the FDA has not enforced in 2018, with CBD remaining the subject of an FDA investigational new drug  evaluation, and is not considered legal as a dietary supplement or food ingredient as of December 2018. Federal illegality has made it difficult historically to conduct research on CBD. CBD is openly sold in head shops and health food stores in some states where such sales have not been explicitly legalized.  Warning: Because it is not FDA approved for general use or treatment of pain, it is not required to undergo the same manufacturing controls as prescription drugs.  This means  that the available cannabidiol (CBD) may be contaminated with THC.  If this is the case, it will trigger a positive urine drug screen (UDS) test for cannabinoids (Marijuana).  Because a positive UDS for illicit substances is a violation of our medication agreement, your opioid analgesics (pain medicine) may be permanently discontinued. (Last update: 11/19/2017) ____________________________________________________________________________________________   ____________________________________________________________________________________________  Gabapentin Titration  Medication used: Gabapentin (Generic Name) or Neurontin (Brand Name) 300 mg tablets/capsules  Reasons to stop increasing the dose:  Reason 1: You get good relief of symptoms, in which case there is no need to increase the daily dose any further.    Reason 2: You develop some side effects, such as sleeping all of the time, difficulty concentrating, or becoming disoriented, in which case you need to go down on the dose, to the prior level, where you were not experiencing any side effects. Stay on that dose longer, to allow more time for your body to get use it, before attempting to increase it again.   Reasons to stop increasing the dose: Reason 1: You get good relief of symptoms, in which case there is no need to increase the daily dose any further.  Reason 2: You develop some side effects, such as sleeping all of the time, difficulty concentrating, or becoming disoriented, in which case you need to go down on the dose, to the prior level, where you were not experiencing any side effects. Stay on that dose longer, to allow more time for your body to get use it, before attempting to increase it again.  Steps to increase medication: Step 1: Start by taking 1 (one) tablet at bedtime x 7 (seven) days.  Step 2: After 7 (seven) days of taking 1 (one) tablet at bedtime, increase it to 2 (two) tablets at bedtime. Stay on this dose x 7  (seven) days.  Step 3: After 7 (seven) days of taking 2 (two) tablets at bedtime, increase it to 3 (three) tablets at bedtime. Stay on this dose x another 7 (seven) days.  Step 4: After 7 (seven) days of taking 3 (three) tablet at bedtime, begin taking 1 (one) tablet at noon with lunch. Stay on this dose x another 7 (seven) days.  Step 5: After 7 (seven) days of taking 3 (three) tablet at bedtime, and 1 (one) tablet at noon, then begin taking 1 (one) tablet in the afternoon with dinner. Stay on this dose x another 7 (seven) days.  Step 6: After 7 (seven) days of taking 3 (three) tablet at bedtime, 1 (one) tablet at noon, and 1 (one) tablet in the afternoon, then begin taking 1 (one) tablet in the morning with breakfast. Stay on this dose x another 7 (seven) days. At this point you should be taking the medicine 4 (four) times a day, or about every 6 (six) hours. This daily regimen of taking the medicine 4 (four) times a day, will be maintained from now on. You should not take any doses any sooner than every 6 (six)  hours.  Step 7: After 7 (seven) days of taking 3 (three) tablet at bedtime, 1 (one) tablet at noon, 1 (one) tablet in the afternoon, and 1 (one) tablet in the morning, begin taking 2 (two) tablets at noon with lunch. Stay on this dose x another 7 (seven) days.   Step 8: After 7 (seven) days of taking 3 (three) tablet at bedtime, 2 (two) tablets at noon, 1 (one) tablet in the afternoon, and 1 (one) tablet in the morning, begin taking 2 (two) tablets in the afternoon with dinner. Stay on this dose x another 7 (seven) days.   Step 9: After 7 (seven) days of taking 3 (three) tablet at bedtime, 2 (two) tablets at noon, 2 (two) tablets in the afternoon, and 1 (one) tablet in the morning, begin taking 2 (two) tablets in the morning with breakfast. Stay on this dose x another 7 (seven) days. At this point you should be taking the medicine 4 (four) times a day, or about every 6 (six) hours. This daily  regimen of taking the medicine 4 (four) times a day, will be maintained from now on. You should not take any doses any sooner than every 6 (six) hours.  Step 10: After 7 (seven) days of taking 3 (three) tablet at bedtime, 2 (two) tablets at noon, 2 (two) tablets in the afternoon, and 2 (two) tablets in the morning, begin taking 3 (three) tablets at noon with lunch. Stay on this dose x another 7 (seven) days.   Step 11: After 7 (seven) days of taking 3 (three) tablet at bedtime, 3 (three) tablets at noon, 2 (two) tablets in the afternoon, and 2 (two) tablets in the morning, begin taking 3 (three) tablets in the afternoon with dinner. Stay on this dose x another 7 (seven) days.   Step 12: After 7 (seven) days of taking 3 (three) tablet at bedtime, 3 (three) tablets at noon, 3 (three) tablets in the afternoon, and 2 (two) tablet in the morning, begin taking 3 (three) tablets in the morning with breakfast. Stay on this dose x another 7 (seven) days. At this point you should be taking the medicine 4 (four) times a day, or about every 6 (six) hours. This daily regimen of taking the medicine 4 (four) times a day, will be maintained from now on.   Endpoint: Once you have reached the maximum dose you can tolerate without side-effects, contact your physician so as to evaluate the results of the regimen.   Questions: Feel free to contact us for any questions or problems at [phone] ____________________________________________________________________________________________   Epidural Steroid Injection An epidural steroid injection is given to relieve pain in your neck, back, or legs that is caused by the irritation or swelling of a nerve root. This procedure involves injecting a steroid and numbing medicine (anesthetic) into the epidural space. The epidural space is the space between the outer covering of your spinal cord and the bones that form your backbone (vertebra).  LET [facility] CARE PROVIDER  KNOW ABOUT:   Any allergies you have.  All medicines you are taking, including vitamins, herbs, eye drops, creams, and over-the-counter medicines such as aspirin.  Previous problems you or members of your family have had with the use of anesthetics.  Any blood disorders or blood clotting disorders you have.  Previous surgeries you have had.  Medical conditions you have.  RISKS AND COMPLICATIONS Generally, this is a safe procedure. However, as with any procedure, complications can occur. Possible  complications of epidural steroid injection include:  Headache.  Bleeding.  Infection.  Allergic reaction to the medicines.  Damage to your nerves. The response to this procedure depends on the underlying cause of the pain and its duration. People who have long-term (chronic) pain are less likely to benefit from epidural steroids than are those people whose pain comes on strong and suddenly.  BEFORE THE PROCEDURE   Ask your health care provider about changing or stopping your regular medicines. You may be advised to stop taking blood-thinning medicines a few days before the procedure.  You may be given medicines to reduce anxiety.  Arrange for someone to take you home after the procedure.  PROCEDURE   You will remain awake during the procedure. You may receive medicine to make you relaxed.  You will be asked to lie on your stomach.  The injection site will be cleaned.  The injection site will be numbed with a medicine (local anesthetic).  A needle will be injected through your skin into the epidural space.  Your health care provider will use an X-ray machine to ensure that the steroid is delivered closest to the affected nerve. You may have minimal discomfort at this time.  Once the needle is in the right position, the local anesthetic and the steroid will be injected into the epidural space.  The needle will then be removed and a bandage will be applied to the injection  site.  AFTER THE PROCEDURE   You may be monitored for a short time before you go home.  You may feel weakness or numbness in your arm or leg, which disappears within hours.  You may be allowed to eat, drink, and take your regular medicine.  You may have soreness at the site of the injection.   This information is not intended to replace advice given to you by your health care provider. Make sure you discuss any questions you have with your health care provider.   Document Released: 11/25/2007 Document Revised: 04/20/2013 Document Reviewed: 02/04/2013 Elsevier Interactive Patient Education 2016 [place]  What are the risk, side effects and possible complications? Generally speaking, most procedures are safe.  However, with any procedure there are risks, side effects, and the possibility of complications.  The risks and complications are dependent upon the sites that are lesioned, or the type of nerve block to be performed.  The closer the procedure is to the spine, the more serious the risks are.  Great care is taken when placing the radio frequency needles, block needles or lesioning probes, but sometimes complications can occur. Infection: Any time there is an injection through the skin, there is a risk of infection.  This is why sterile conditions are used for these blocks. There are four possible types of infection: 1. Localized skin infection. 2. Central Nervous System Infection: This can be in the form of Meningitis, which can be deadly. 3. Epidural Infections: This can be in the form of an epidural abscess, which can cause pressure inside of the spine, causing compression of the spinal cord with subsequent paralysis. This would require an emergency surgery to decompress, and there are no guarantees that the patient would recover from the paralysis. 4. Discitis: This is an infection of the intervertebral discs. It occurs in about 1% of discography  procedures. It is difficult to treat and it may lead to surgery. Pain: the needles have to go through skin and soft tissues, will cause soreness. Damage  to internal structures:  The nerves to be lesioned may be near blood vessels or other nerves which can be potentially damaged. Bleeding: Bleeding is more common if the patient is taking blood thinners such as  aspirin, Coumadin, Ticiid, Plavix, etc., or if he/she have some genetic predisposition such as hemophilia. Bleeding into the spinal canal can cause compression of the spinal  cord with subsequent paralysis.  This would require an emergency surgery to decompress and there are no guarantees that the patient would recover from the paralysis. Pneumothorax: Puncturing of a lung is a possibility, every time a needle is introduced in the area of the chest or upper back.  Pneumothorax refers to free air around the collapsed lung(s), inside of the thoracic cavity (chest cavity).  Another two possible complications related to a similar event would include: Hemothorax and Chylothorax. These are variations of the Pneumothorax, where instead of air around the collapsed lung(s), you may have blood or chyle, respectively. Spinal headaches: They may occur with any procedures in the area of the spine. Persistent CSF (Cerebro-Spinal Fluid) leakage: This is a rare problem, but may occur with prolonged intrathecal or epidural catheters either due to the formation of a fistulous track or a dural tear. Nerve damage: By working so close to the spinal cord, there is always a possibility of nerve damage, which could be as serious as a permanent spinal cord injury with paralysis. Death: Although rare, severe deadly allergic reactions known as "Anaphylactic reaction" can occur to any of the medications used. Worsening of the symptoms: We can always make thing worse.  What are the chances of something like this happening? Chances of any of this occuring are extremely low.  By  statistics, you have more of a chance of getting killed in a motor vehicle accident: while driving to the hospital than any of the above occurring .  Nevertheless, you should be aware that they are possibilities.  In general, it is similar to taking a shower.  Everybody knows that you can slip, hit your head and get killed.  Does that mean that you should not shower again?  Nevertheless always keep in mind that statistics do not mean anything if you happen to be on the wrong side of them.  Even if a procedure has a 1 (one) in a 1,000,000 (million) chance of going wrong, it you happen to be that one..Also, keep in mind that by statistics, you have more of a chance of having something go wrong when taking medications.  Who should not have this procedure? If you are on a blood thinning medication (e.g. Coumadin, Plavix, see list of "Blood Thinners"), or if you have an active infection going on, you should not have the procedure.  If you are taking any blood thinners, please inform your physician.  Preparing for your procedure: 1. Do not eat or drink anything at least eight (8) hours prior to the procedure. 2. Bring a driver with you .  It cannot be a taxi. 3. Come accompanied by an adult that can drive you back, and that is strong enough to help you if your legs get weak or numb from the local anesthetic. 4. Take all of your medicines the morning of the procedure with just enough water to swallow them. 5. If you have diabetes, make sure that you are scheduled to have your procedure done first thing in the morning, whenever possible. 6. If you have diabetes, take only half of your insulin dose and notify  our nurse that you have done so as soon as you arrive at the clinic. 7. If you are diabetic, but only take blood sugar pills (oral hypoglycemic), then do not take them on the morning of your procedure.  You may take them after you have had the procedure. 8. Do not take aspirin or any aspirin-containing  medications, at least eleven (11) days prior to the procedure.  They may prolong bleeding. 9. Wear loose fitting clothing that may be easy to take off and that you would not mind if it got stained with Betadine or blood. 10. Do not wear any jewelry or perfume 11. Remove any nail coloring.  It will interfere with some of our monitoring equipment. 12. If you take Metformin for your diabetes, stop it 48 hours prior to the procedure.  NOTE: Remember that this is not meant to be interpreted as a complete list of all possible complications.  Unforeseen problems may occur.  BLOOD THINNERS The following drugs contain aspirin or other products, which can cause increased bleeding during surgery and should not be taken for 2 weeks prior to and 1 week after surgery.  If you should need take something for relief of minor pain, you may take acetaminophen which is found in Tylenol,m Datril, Anacin-3 and Panadol. It is not blood thinner. The products listed below are.  Do not take any of the products listed below in addition to any listed on your instruction sheet.  A.P.C or A.P.C with Codeine Codeine Phosphate Capsules #3 Ibuprofen Ridaura  ABC compound Congesprin Imuran rimadil  Advil Cope Indocin Robaxisal  Alka-Seltzer Effervescent Pain Reliever and Antacid Coricidin or Coricidin-D  Indomethacin Rufen  Alka-Seltzer plus Cold Medicine Cosprin Ketoprofen S-A-C Tablets  Anacin Analgesic Tablets or Capsules Coumadin Korlgesic Salflex  Anacin Extra Strength Analgesic tablets or capsules CP-2 Tablets Lanoril Salicylate  Anaprox Cuprimine Capsules Levenox Salocol  Anexsia-D Dalteparin Magan Salsalate  Anodynos Darvon compound Magnesium Salicylate Sine-off  Ansaid Dasin Capsules Magsal Sodium Salicylate  Anturane Depen Capsules Marnal Soma  APF Arthritis pain formula Dewitt's Pills Measurin Stanback  Argesic Dia-Gesic Meclofenamic Sulfinpyrazone  Arthritis Bayer Timed Release Aspirin Diclofenac Meclomen  Sulindac  Arthritis pain formula Anacin Dicumarol Medipren Supac  Analgesic (Safety coated) Arthralgen Diffunasal Mefanamic Suprofen  Arthritis Strength Bufferin Dihydrocodeine Mepro Compound Suprol  Arthropan liquid Dopirydamole Methcarbomol with Aspirin Synalgos  ASA tablets/Enseals Disalcid Micrainin Tagament  Ascriptin Doan's Midol Talwin  Ascriptin A/D Dolene Mobidin Tanderil  Ascriptin Extra Strength Dolobid Moblgesic Ticlid  Ascriptin with Codeine Doloprin or Doloprin with Codeine Momentum Tolectin  Asperbuf Duoprin Mono-gesic Trendar  Aspergum Duradyne Motrin or Motrin IB Triminicin  Aspirin plain, buffered or enteric coated Durasal Myochrisine Trigesic  Aspirin Suppositories Easprin Nalfon Trillsate  Aspirin with Codeine Ecotrin Regular or Extra Strength Naprosyn Uracel  Atromid-S Efficin Naproxen Ursinus  Auranofin Capsules Elmiron Neocylate Vanquish  Axotal Emagrin Norgesic Verin  Azathioprine Empirin or Empirin with Codeine Normiflo Vitamin E  Azolid Emprazil Nuprin Voltaren  Bayer Aspirin plain, buffered or children's or timed BC Tablets or powders Encaprin Orgaran Warfarin Sodium  Buff-a-Comp Enoxaparin Orudis Zorpin  Buff-a-Comp with Codeine Equegesic Os-Cal-Gesic   Buffaprin Excedrin plain, buffered or Extra Strength Oxalid   Bufferin Arthritis Strength Feldene Oxphenbutazone   Bufferin plain or Extra Strength Feldene Capsules Oxycodone with Aspirin   Bufferin with Codeine Fenoprofen Fenoprofen Pabalate or Pabalate-SF   Buffets II Flogesic Panagesic   Buffinol plain or Extra Strength Florinal or Florinal with Codeine Panwarfarin   Buf-Tabs Flurbiprofen Penicillamine  Butalbital Compound Four-way cold tablets Penicillin   Butazolidin Fragmin Pepto-Bismol   Carbenicillin Geminisyn Percodan   Carna Arthritis Reliever Geopen Persantine   Carprofen Gold's salt Persistin   Chloramphenicol Goody's Phenylbutazone   Chloromycetin Haltrain Piroxlcam   Clmetidine heparin  Plaquenil   Cllnoril Hyco-pap Ponstel   Clofibrate Hydroxy chloroquine Propoxyphen         Before stopping any of these medications, be sure to consult the physician who ordered them.  Some, such as Coumadin (Warfarin) are ordered to prevent or treat serious conditions such as "deep thrombosis", "pumonary embolisms", and other heart problems.  The amount of time that you may need off of the medication may also vary with the medication and the reason for which you were taking it.  If you are taking any of these medications, please make sure you notify your pain physician before you undergo any procedures.

## 2018-10-31 ENCOUNTER — Other Ambulatory Visit: Payer: Self-pay | Admitting: Pain Medicine

## 2018-10-31 DIAGNOSIS — E559 Vitamin D deficiency, unspecified: Secondary | ICD-10-CM

## 2018-11-04 ENCOUNTER — Encounter: Payer: Self-pay | Admitting: Pain Medicine

## 2018-11-04 ENCOUNTER — Ambulatory Visit
Admission: RE | Admit: 2018-11-04 | Discharge: 2018-11-04 | Disposition: A | Discharge status: Home / Self Care | Payer: BC Managed Care – PPO | Source: Ambulatory Visit | Attending: Pain Medicine | Admitting: Pain Medicine

## 2018-11-04 ENCOUNTER — Other Ambulatory Visit: Payer: Self-pay

## 2018-11-04 ENCOUNTER — Ambulatory Visit (HOSPITAL_BASED_OUTPATIENT_CLINIC_OR_DEPARTMENT_OTHER): Payer: BC Managed Care – PPO | Admitting: Pain Medicine

## 2018-11-04 VITALS — BP 123/73 | HR 54 | Temp 97.3°F | Resp 18 | Ht 63.0 in | Wt 220.0 lb

## 2018-11-04 DIAGNOSIS — M79602 Pain in left arm: Secondary | ICD-10-CM | POA: Diagnosis present

## 2018-11-04 DIAGNOSIS — M79601 Pain in right arm: Secondary | ICD-10-CM | POA: Insufficient documentation

## 2018-11-04 DIAGNOSIS — Z91013 Allergy to seafood: Secondary | ICD-10-CM

## 2018-11-04 DIAGNOSIS — G959 Disease of spinal cord, unspecified: Secondary | ICD-10-CM

## 2018-11-04 DIAGNOSIS — M47812 Spondylosis without myelopathy or radiculopathy, cervical region: Secondary | ICD-10-CM | POA: Insufficient documentation

## 2018-11-04 DIAGNOSIS — M542 Cervicalgia: Secondary | ICD-10-CM | POA: Diagnosis present

## 2018-11-04 DIAGNOSIS — M4802 Spinal stenosis, cervical region: Secondary | ICD-10-CM

## 2018-11-04 DIAGNOSIS — M5030 Other cervical disc degeneration, unspecified cervical region: Secondary | ICD-10-CM | POA: Diagnosis present

## 2018-11-04 DIAGNOSIS — G8929 Other chronic pain: Secondary | ICD-10-CM | POA: Diagnosis present

## 2018-11-04 MED ORDER — DEXAMETHASONE SODIUM PHOSPHATE 10 MG/ML IJ SOLN
10.0000 mg | Freq: Once | INTRAMUSCULAR | Status: AC
  Administered 2018-11-04: 10 mg
  Filled 2018-11-04: qty 1

## 2018-11-04 MED ORDER — MIDAZOLAM HCL 5 MG/5ML IJ SOLN
1.0000 mg | INTRAMUSCULAR | Status: DC | PRN
  Administered 2018-11-04: 2 mg via INTRAVENOUS
  Filled 2018-11-04: qty 5

## 2018-11-04 MED ORDER — SODIUM CHLORIDE 0.9% FLUSH: 1.0000 mL | Freq: Once | INTRAVENOUS | Status: DC

## 2018-11-04 MED ORDER — FENTANYL CITRATE (PF) 100 MCG/2ML IJ SOLN
25.0000 ug | INTRAMUSCULAR | Status: DC | PRN
  Administered 2018-11-04: 50 ug via INTRAVENOUS
  Filled 2018-11-04: qty 2

## 2018-11-04 MED ORDER — LACTATED RINGERS IV SOLN
1000.0000 mL | Freq: Once | INTRAVENOUS | Status: AC
  Administered 2018-11-04: 1000 mL via INTRAVENOUS

## 2018-11-04 MED ORDER — LIDOCAINE HCL 2 % IJ SOLN
20.0000 mL | Freq: Once | INTRAMUSCULAR | Status: AC
  Administered 2018-11-04: 400 mg
  Filled 2018-11-04: qty 40

## 2018-11-04 MED ORDER — ROPIVACAINE HCL 2 MG/ML IJ SOLN
1.0000 mL | Freq: Once | INTRAMUSCULAR | Status: AC
  Administered 2018-11-04: 1 mL via EPIDURAL
  Filled 2018-11-04: qty 10

## 2018-11-04 NOTE — Progress Notes (Signed)
Safety precautions to be maintained throughout the outpatient stay will include: orient to surroundings, keep bed in low position, maintain call bell within reach at all times, provide assistance with transfer out of bed and ambulation.  

## 2018-11-04 NOTE — Patient Instructions (Addendum)
____________________________________________________________________________________________  Post-Procedure Discharge Instructions  Instructions:  Apply ice:   Purpose: This will minimize any swelling and discomfort after procedure.   When: Day of procedure, as soon as you get home.  How: Fill a plastic sandwich bag with crushed ice. Cover it with a small towel and apply to injection site.  How long: (15 min on, 15 min off) Apply for 15 minutes then remove x 15 minutes.  Repeat sequence on day of procedure, until you go to bed.  Apply heat:   Purpose: To treat any soreness and discomfort from the procedure.  When: Starting the next day after the procedure.  How: Apply heat to procedure site starting the day following the procedure.  How long: May continue to repeat daily, until discomfort goes away.  Food intake: Start with clear liquids (like water) and advance to regular food, as tolerated.   Physical activities: Keep activities to a minimum for the first 8 hours after the procedure. After that, then as tolerated.  Driving: If you have received any sedation, be responsible and do not drive. You are not allowed to drive for 24 hours after having sedation.  Blood thinner: (Applies only to those taking blood thinners) You may restart your blood thinner 6 hours after your procedure.  Insulin: (Applies only to Diabetic patients taking insulin) As soon as you can eat, you may resume your normal dosing schedule.  Infection prevention: Keep procedure site clean and dry. Shower daily and clean area with soap and water.  Post-procedure Pain Diary: Extremely important that this be done correctly and accurately. Recorded information will be used to determine the next step in treatment. For the purpose of accuracy, follow these rules:  Evaluate only the area treated. Do not report or include pain from an untreated area. For the purpose of this evaluation, ignore all other areas of pain,  except for the treated area.  After your procedure, avoid taking a long nap and attempting to complete the pain diary after you wake up. Instead, set your alarm clock to go off every hour, on the hour, for the initial 8 hours after the procedure. Document the duration of the numbing medicine, and the relief you are getting from it.  Do not go to sleep and attempt to complete it later. It will not be accurate. If you received sedation, it is likely that you were given a medication that may cause amnesia. Because of this, completing the diary at a later time may cause the information to be inaccurate. This information is needed to plan your care.  Follow-up appointment: Keep your post-procedure follow-up evaluation appointment after the procedure (usually 2 weeks for most procedures, 6 weeks for radiofrequencies). DO NOT FORGET to bring you pain diary with you.   Expect: (What should I expect to see with my procedure?)  From numbing medicine (AKA: Local Anesthetics): Numbness or decrease in pain. You may also experience some weakness, which if present, could last for the duration of the local anesthetic.  Onset: Full effect within 15 minutes of injected.  Duration: It will depend on the type of local anesthetic used. On the average, 1 to 8 hours.   From steroids (Applies only if steroids were used): Decrease in swelling or inflammation. Once inflammation is improved, relief of the pain will follow.  Onset of benefits: Depends on the amount of swelling present. The more swelling, the longer it will take for the benefits to be seen. In some cases, up to 10 days.    Duration: Steroids will stay in the system x 2 weeks. Duration of benefits will depend on multiple posibilities including persistent irritating factors.  Side-effects: If present, they may typically last 2 weeks (the duration of the steroids).  Frequent: Cramps (if they occur, drink Gatorade and take over-the-counter Magnesium 450-500 mg  once to twice a day); water retention with temporary weight gain; increases in blood sugar; decreased immune system response; increased appetite.  Occasional: Facial flushing (red, warm cheeks); mood swings; menstrual changes.  Uncommon: Long-term decrease or suppression of natural hormones; bone thinning. (These are more common with higher doses or more frequent use. This is why we prefer that our patients avoid having any injection therapies in other practices.)   Very Rare: Severe mood changes; psychosis; aseptic necrosis.  From procedure: Some discomfort is to be expected once the numbing medicine wears off. This should be minimal if ice and heat are applied as instructed.  Call if: (When should I call?)  You experience numbness and weakness that gets worse with time, as opposed to wearing off.  New onset bowel or bladder incontinence. (Applies only to procedures done in the spine)  Emergency Numbers:  Durning business hours (Monday - Thursday, 8:00 AM - 4:00 PM) (Friday, 9:00 AM - 12:00 Noon): [phone]  After hours: [phone]  NOTE: If you are having a problem and are unable connect with, or to talk to a provider, then go to your nearest urgent care or emergency department. If the problem is serious and urgent, please call 911. ____________________________________________________________________________________________   Pain Management Discharge Instructions  General Discharge Instructions :  If you need to reach your doctor call: Monday-Friday 8:00 am - 4:00 pm at [phone] or toll free [phone].  After clinic hours [phone] to have operator reach doctor.  Bring all of your medication bottles to all your appointments in the pain clinic.  To cancel or reschedule your appointment with Pain Management please remember to call 24 hours in advance to avoid a fee.  Refer to the educational materials which you have been given on: General Risks, I had my  Procedure. Discharge Instructions, Post Sedation.  Post Procedure Instructions:  The drugs you were given will stay in your system until tomorrow, so for the next 24 hours you should not drive, make any legal decisions or drink any alcoholic beverages.  You may eat anything you prefer, but it is better to start with liquids then soups and crackers, and gradually work up to solid foods.  Please notify your doctor immediately if you have any unusual bleeding, trouble breathing or pain that is not related to your normal pain.  Depending on the type of procedure that was done, some parts of your body may feel week and/or numb.  This usually clears up by tonight or the next day.  Walk with the use of an assistive device or accompanied by an adult for the 24 hours.  You may use ice on the affected area for the first 24 hours.  Put ice in a Ziploc bag and cover with a towel and place against area 15 minutes on 15 minutes off.  You may switch to heat after 24 hours.Epidural Steroid Injection Patient Information  Description: The epidural space surrounds the nerves as they exit the spinal cord.  In some patients, the nerves can be compressed and inflamed by a bulging disc or a tight spinal canal (spinal stenosis).  By injecting steroids into the epidural space, we can bring irritated nerves   into direct contact with a potentially helpful medication.  These steroids act directly on the irritated nerves and can reduce swelling and inflammation which often leads to decreased pain.  Epidural steroids may be injected anywhere along the spine and from the neck to the low back depending upon the location of your pain.   After numbing the skin with local anesthetic (like Novocaine), a small needle is passed into the epidural space slowly.  You may experience a sensation of pressure while this is being done.  The entire block usually last less than 10 minutes.  Conditions which may be treated by epidural  steroids:   Low back and leg pain  Neck and arm pain  Spinal stenosis  Post-laminectomy syndrome  Herpes zoster (shingles) pain  Pain from compression fractures  Preparation for the injection:  1. Do not eat any solid food or dairy products within 8 hours of your appointment.  2. You may drink clear liquids up to 3 hours before appointment.  Clear liquids include water, black coffee, juice or soda.  No milk or cream please. 3. You may take your regular medication, including pain medications, with a sip of water before your appointment  Diabetics should hold regular insulin (if taken separately) and take 1/2 normal NPH dos the morning of the procedure.  Carry some sugar containing items with you to your appointment. 4. A driver must accompany you and be prepared to drive you home after your procedure.  5. Bring all your current medications with your. 6. An IV may be inserted and sedation may be given at the discretion of the physician.   7. A blood pressure cuff, EKG and other monitors will often be applied during the procedure.  Some patients may need to have extra oxygen administered for a short period. 8. You will be asked to provide medical information, including your allergies, prior to the procedure.  We must know immediately if you are taking blood thinners (like Coumadin/Warfarin)  Or if you are allergic to IV iodine contrast (dye). We must know if you could possible be pregnant.  Possible side-effects:  Bleeding from needle site  Infection (rare, may require surgery)  Nerve injury (rare)  Numbness & tingling (temporary)  Difficulty urinating (rare, temporary)  Spinal headache ( a headache worse with upright posture)  Light -headedness (temporary)  Pain at injection site (several days)  Decreased blood pressure (temporary)  Weakness in arm/leg (temporary)  Pressure sensation in back/neck (temporary)  Call if you experience:  Fever/chills associated with  headache or increased back/neck pain.  Headache worsened by an upright position.  New onset weakness or numbness of an extremity below the injection site  Hives or difficulty breathing (go to the emergency room)  Inflammation or drainage at the infection site  Severe back/neck pain  Any new symptoms which are concerning to you  Please note:  Although the local anesthetic injected can often make your back or neck feel good for several hours after the injection, the pain will likely return.  It takes 3-7 days for steroids to work in the epidural space.  You may not notice any pain relief for at least that one week.  If effective, we will often do a series of three injections spaced 3-6 weeks apart to maximally decrease your pain.  After the initial series, we generally will wait several months before considering a repeat injection of the same type.  If you have any questions, please call [phone] [facility] 

## 2018-11-04 NOTE — Progress Notes (Addendum)
Patient's Name: Anita Gibbs  MRN: [id number]  Referring Provider: Jodelle Green, FNP  DOB: 05-Dec-1970  PCP: Jodelle Green, FNP  DOS: 11/04/2018  Note by: Gaspar Cola, MD  Service setting: Ambulatory outpatient  Specialty: Interventional Pain Management  Patient type: Established  Location: [facility] (AMB) Pain Management Facility  Visit type: Interventional Procedure   Primary Reason for Visit: Interventional Pain Management Treatment. CC: Neck Pain and Back Pain (lower)  Procedure:          Anesthesia, Analgesia, Anxiolysis:  Type: Therapeutic, Inter-Laminar, Cervical Epidural Steroid Injection  #2  Region: Posterior Cervico-thoracic Region Level: C7-T1 Laterality: Right-Sided Paramedial  Type: Moderate (Conscious) Sedation combined with Local Anesthesia Indication(s): Analgesia and Anxiety Route: Intravenous (IV) IV Access: Secured Sedation: Meaningful verbal contact was maintained at all times during the procedure  Local Anesthetic: Lidocaine 1-2%  Position: Prone with head of the table was raised to facilitate breathing.   Indications: 1. DDD (degenerative disc disease), cervical   2. 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 Cervical myelopathy (HCC)   5. Cervical spondylosis   6. Cervicalgia (Primary Area of Pain) (Bilateral) (L>R)   7. Chronic upper extremity pain (Secondary Area of Pain) (Bilateral) (R>L)    Pain Score: Pre-procedure: 5 /10 Post-procedure: 0-No pain/10  Pre-op Assessment:  Ms. Piccininni is a 48 y.o. (year old), female patient, seen today for interventional treatment. She  has a past surgical history that includes Knee arthroscopy (Right) and Anterior cervical decomp/discectomy fusion (N/A, 07/01/2017). Ms. Shippey has a current medication list which includes the following prescription(s): albuterol, amlodipine, aspirin-acetaminophen-caffeine, calcium carbonate, vitamin  d3, diphenhydramine, epinephrine, ergocalciferol, famotidine, fluticasone, gabapentin, losartan, magnesium, methocarbamol, montelukast, ondansetron, oxycodone, amoxicillin-clavulanate, and chlorpheniramine-hydrocodone, and the following Facility-Administered Medications: fentanyl, lactated ringers, midazolam, and sodium chloride flush. Her primarily concern today is the Neck Pain and Back Pain (lower)  Initial Vital Signs:  Pulse/HCG Rate: (!) 58ECG Heart Rate: (!) 53 Temp: 98.4 F (36.9 C) Resp: 16 BP: 125/76 SpO2: 100 %  BMI: Estimated body mass index is 38.97 kg/m as calculated from the following:   Height as of this encounter: 5\' 3"  (1.6 m).   Weight as of this encounter: 220 lb (99.8 kg).  Risk Assessment: Allergies: Reviewed. She is allergic to other; shellfish allergy; strawberry (diagnostic); and tomato.  Allergy Precautions: No iodine containing solutions or radiological contrast used. Coagulopathies: Reviewed. None identified.  Blood-thinner therapy: None at this time Active Infection(s): Reviewed. None identified. Ms. [place] is afebrile  Site Confirmation: Ms. Dement was asked to confirm the procedure and laterality before marking the site Procedure checklist: Completed Consent: Before the procedure and under the influence of no sedative(s), amnesic(s), or anxiolytics, the patient was informed of the treatment options, risks and possible complications. To fulfill our ethical and legal obligations, as recommended by the American Medical Association's Code of Ethics, I have informed the patient of my clinical impression; the nature and purpose of the treatment or procedure; the risks, benefits, and possible complications of the intervention; the alternatives, including doing nothing; the risk(s) and benefit(s) of the alternative treatment(s) or procedure(s); and the risk(s) and benefit(s) of doing nothing. The patient was provided information about the general risks and possible  complications associated with the procedure. These may include, but are not limited to: failure to achieve desired goals, infection, bleeding, organ or nerve damage, allergic reactions, paralysis, and death. In addition, the patient was  informed of those risks and complications associated to Spine-related procedures, such as failure to decrease pain; infection (i.e.: Meningitis, epidural or intraspinal abscess); bleeding (i.e.: epidural hematoma, subarachnoid hemorrhage, or any other type of intraspinal or peri-dural bleeding); organ or nerve damage (i.e.: Any type of peripheral nerve, nerve root, or spinal cord injury) with subsequent damage to sensory, motor, and/or autonomic systems, resulting in permanent pain, numbness, and/or weakness of one or several areas of the body; allergic reactions; (i.e.: anaphylactic reaction); and/or death. Furthermore, the patient was informed of those risks and complications associated with the medications. These include, but are not limited to: allergic reactions (i.e.: anaphylactic or anaphylactoid reaction(s)); adrenal axis suppression; blood sugar elevation that in diabetics may result in ketoacidosis or comma; water retention that in patients with history of congestive heart failure may result in shortness of breath, pulmonary edema, and decompensation with resultant heart failure; weight gain; swelling or edema; medication-induced neural toxicity; particulate matter embolism and blood vessel occlusion with resultant organ, and/or nervous system infarction; and/or aseptic necrosis of one or more joints. Finally, the patient was informed that Medicine is not an exact science; therefore, there is also the possibility of unforeseen or unpredictable risks and/or possible complications that may result in a catastrophic outcome. The patient indicated having understood very clearly. We have given the patient no guarantees and we have made no promises. Enough time was given to the  patient to ask questions, all of which were answered to the patient's satisfaction. Ms. Moffitt has indicated that she wanted to continue with the procedure. Attestation: I, the ordering provider, attest that I have discussed with the patient the benefits, risks, side-effects, alternatives, likelihood of achieving goals, and potential problems during recovery for the procedure that I have provided informed consent. Date  Time: 11/04/2018 10:13 AM  Pre-Procedure Preparation:  Monitoring: As per clinic protocol. Respiration, ETCO2, SpO2, BP, heart rate and rhythm monitor placed and checked for adequate function Safety Precautions: Patient was assessed for positional comfort and pressure points before starting the procedure. Time-out: I initiated and conducted the "Time-out" before starting the procedure, as per protocol. The patient was asked to participate by confirming the accuracy of the "Time Out" information. Verification of the correct person, site, and procedure were performed and confirmed by me, the nursing staff, and the patient. "Time-out" conducted as per Joint Commission's Universal Protocol (UP.01.01.01). Time: 1115  Description of Procedure:          Target Area: For Epidural Steroid injections the target is the interlaminar space, initially targeting the lower border of the superior vertebral body lamina. Approach: Paramedial approach. Area Prepped: Entire PosteriorCervical Region Prepping solution: ChloraPrep (2% chlorhexidine gluconate and 70% isopropyl alcohol) Safety Precautions: Aspiration looking for blood return was conducted prior to all injections. At no point did we inject any substances, as a needle was being advanced. No attempts were made at seeking any paresthesias. Safe injection practices and needle disposal techniques used. Medications properly checked for expiration dates. SDV (single dose vial) medications used. Description of the Procedure: Protocol guidelines were  followed. The procedure needle was introduced through the skin, ipsilateral to the reported pain, and advanced to the target area. Bone was contacted and the needle walked caudad, until the lamina was cleared. The epidural space was identified using "loss-of-resistance technique" with 2-3 ml of PF-NaCl (0.9% NSS), in a 5cc LOR glass syringe. Vitals:   11/04/18 1115 11/04/18 1120 11/04/18 1125 11/04/18 1135  BP: 120/75 124/77 126/81 127/70  Pulse: Marland Kitchen)  51 (!) 52 (!) 54   Resp: 18 16 17 16   Temp:      TempSrc:      SpO2: 97% 98% 96% 100%  Weight:      Height:        Start Time: 1115 hrs. End Time: 1125 hrs. Materials:  Needle(s) Type: Epidural needle Gauge: 17G Length: 3.5-in Medication(s): Please see orders for medications and dosing details.  Imaging Guidance (Spinal):          Type of Imaging Technique: Fluoroscopy Guidance (Spinal) Indication(s): Assistance in needle guidance and placement for procedures requiring needle placement in or near specific anatomical locations not easily accessible without such assistance. Exposure Time: Please see nurses notes. Contrast: Before injecting any contrast, we confirmed that the patient did not have an allergy to iodine, shellfish, or radiological contrast. Once satisfactory needle placement was completed at the desired level, radiological contrast was injected. Contrast injected under live fluoroscopy. No contrast complications. See chart for type and volume of contrast used. Fluoroscopic Guidance: I was personally present during the use of fluoroscopy. "Tunnel Vision Technique" used to obtain the best possible view of the target area. Parallax error corrected before commencing the procedure. "Direction-depth-direction" technique used to introduce the needle under continuous pulsed fluoroscopy. Once target was reached, antero-posterior, oblique, and lateral fluoroscopic projection used confirm needle placement in all planes. Images permanently stored  in EMR. Interpretation: I personally interpreted the imaging intraoperatively. Adequate needle placement confirmed in multiple planes. Appropriate spread of contrast into desired area was observed. No evidence of afferent or efferent intravascular uptake. No intrathecal or subarachnoid spread observed. Permanent images saved into the patient's record.  Antibiotic Prophylaxis:   Anti-infectives (From admission, onward)   None     Indication(s): None identified  Post-operative Assessment:  Post-procedure Vital Signs:  Pulse/HCG Rate: (!) 54(!) 53 Temp: 98.4 F (36.9 C) Resp: 16 BP: 127/70 SpO2: 100 %  EBL: None  Complications: No immediate post-treatment complications observed by team, or reported by patient.  Note: The patient tolerated the entire procedure well. A repeat set of vitals were taken after the procedure and the patient was kept under observation following institutional policy, for this type of procedure. Post-procedural neurological assessment was performed, showing return to baseline, prior to discharge. The patient was provided with post-procedure discharge instructions, including a section on how to identify potential problems. Should any problems arise concerning this procedure, the patient was given instructions to immediately contact us, at any time, without hesitation. In any case, we plan to contact the patient by telephone for a follow-up status report regarding this interventional procedure.  Comments:  No additional relevant information.  Plan of Care    Imaging Orders     DG C-Arm 1-60 Min-No Report Orders:  Orders Placed This Encounter  Procedures  . Cervical Epidural Injection    Procedure: Cervical Epidural Steroid Injection/Block Purpose: Therapeutic Indication(s): Radiculitis and cervicalgia associater with cervical degenerative disc disease.    Scheduling Instructions:     Level(s): C7-T1     Laterality: Right-sided     Sedation: Patient's  choice.     Timeframe: Today    Order Specific Question:   Where will this procedure be performed?    Answer:   [facility]    Comments:   by Dr. Dossie Arbour  . DG C-Arm 1-60 Min-No Report    Intraoperative interpretation by procedural physician at [place].    Standing Status:   Standing    Number  of Occurrences:   1    Order Specific Question:   Reason for exam:    Answer:   Assistance in needle guidance and placement for procedures requiring needle placement in or near specific anatomical locations not easily accessible without such assistance.  . Provider attestation of informed consent for procedure/surgical case    I, the ordering provider, attest that I have discussed with the patient the benefits, risks, side effects, alternatives, likelihood of achieving goals and potential problems during recovery for the procedure that I have provided informed consent.    Standing Status:   Standing    Number of Occurrences:   1  . Informed Consent Details: Transcribe to consent form and obtain patient signature    Consent Attestation: I, the ordering provider, attest that I have discussed with the patient the benefits, risks, side-effects, alternatives, likelihood of achieving goals, and potential problems during recovery for the procedure that I have provided informed consent.    Standing Status:   Standing    Number of Occurrences:   1    Order Specific Question:   Procedure    Answer:   Right-sided Cervical epidural steroid injection under fluoroscopic guidance.    Order Specific Question:   Surgeon    Answer:   Kathlen [place]. Dossie Arbour, MD    Order Specific Question:   Indication/Reason    Answer:   Neck pain and upper extremity pain secondary to cervical radiculitis   Medications ordered for procedure: Meds ordered this encounter  Medications  . lidocaine (XYLOCAINE) 2 % (with pres) injection 400 mg  . midazolam (VERSED) 5 MG/5ML injection 1-2 mg    Make sure Flumazenil  is available in the pyxis when using this medication. If oversedation occurs, administer 0.2 mg IV over 15 sec. If after 45 sec no response, administer 0.2 mg again over 1 min; may repeat at 1 min intervals; not to exceed 4 doses (1 mg)  . fentaNYL (SUBLIMAZE) injection 25-50 mcg    Make sure Narcan is available in the pyxis when using this medication. In the event of respiratory depression (RR< 8/min): Titrate NARCAN (naloxone) in increments of 0.1 to 0.2 mg IV at 2-3 minute intervals, until desired degree of reversal.  . lactated ringers infusion 1,000 mL  . sodium chloride flush (NS) 0.9 % injection 1 mL  . ropivacaine (PF) 2 mg/mL (0.2%) (NAROPIN) injection 1 mL  . dexamethasone (DECADRON) injection 10 mg   Medications administered: We administered lidocaine, midazolam, fentaNYL, lactated ringers, ropivacaine (PF) 2 mg/mL (0.2%), and dexamethasone.  See the medical record for exact dosing, route, and time of administration.  Disposition: Discharge home  Discharge Date & Time: 11/04/2018; 1155 hrs.   Follow-up plan:   Return for PPE (2 wks) w/ Dr. Dossie Arbour.   Future Appointments  Date Time Provider [place]  11/09/2018  9:30 AM Shelton Silvas, PT [facility] None  11/11/2018  9:00 AM Shelton Silvas, PT [facility] None  11/16/2018  9:45 AM Shelton Silvas, PT [facility] None  11/18/2018  9:00 AM Shelton Silvas, PT [facility] None  11/23/2018  9:45 AM Shelton Silvas, PT [facility] None  11/25/2018  9:00 AM Shelton Silvas, PT [facility] None  11/29/2018  9:00 AM Jodelle Green, FNP [facility]  11/30/2018  9:45 AM Shelton Silvas, PT [facility] None  12/02/2018  9:45 AM Shelton Silvas, PT [facility] None  12/07/2018  9:00 AM Shelton Silvas, PT [facility] None  12/10/2018  9:00 AM Shelton Silvas, PT [facility] None  12/14/2018  9:00  AM Shelton Silvas, PT [facility] None  12/16/2018  9:00 AM Shelton Silvas, PT [facility] None  12/21/2018  9:00 AM Shelton Silvas, PT [facility] None  12/23/2018  9:00 AM  Shelton Silvas, PT [facility] None  12/28/2018  9:00 AM Shelton Silvas, PT [facility] None  12/30/2018  9:00 AM Shelton Silvas, PT [facility] None   Primary Care Physician: Jodelle Green, FNP Location: [facility] Note by: Gaspar Cola, MD Date: 11/04/2018; Time: 11:38 AM  Disclaimer:  Medicine is not an Chief Strategy Officer. The only guarantee in medicine is that nothing is guaranteed. It is important to note that the decision to proceed with this intervention was based on the information collected from the patient. The Data and conclusions were drawn from the patient's questionnaire, the interview, and the physical examination. Because the information was provided in large part by the patient, it cannot be guaranteed that it has not been purposely or unconsciously manipulated. Every effort has been made to obtain as much relevant data as possible for this evaluation. It is important to note that the conclusions that lead to this procedure are derived in large part from the available data. Always take into account that the treatment will also be dependent on availability of resources and existing treatment guidelines, considered by other Pain Management Practitioners as being common knowledge and practice, at the time of the intervention. For Medico-Legal purposes, it is also important to point out that variation in procedural techniques and pharmacological choices are the acceptable norm. The indications, contraindications, technique, and results of the above procedure should only be interpreted and judged by a Board-Certified Interventional Pain Specialist with extensive familiarity and expertise in the same exact procedure and technique.

## 2018-11-05 ENCOUNTER — Ambulatory Visit: Payer: BC Managed Care – PPO | Admitting: Family Medicine

## 2018-11-05 ENCOUNTER — Telehealth: Payer: Self-pay | Admitting: *Deleted

## 2018-11-05 ENCOUNTER — Encounter: Payer: Self-pay | Admitting: Family Medicine

## 2018-11-05 VITALS — BP 120/70 | HR 73 | Temp 99.5°F | Ht 63.0 in | Wt 228.6 lb

## 2018-11-05 DIAGNOSIS — N898 Other specified noninflammatory disorders of vagina: Secondary | ICD-10-CM

## 2018-11-05 DIAGNOSIS — Z01411 Encounter for gynecological examination (general) (routine) with abnormal findings: Secondary | ICD-10-CM

## 2018-11-05 LAB — POCT URINALYSIS DIPSTICK
Bilirubin, UA: NEGATIVE
Glucose, UA: NEGATIVE
Ketones, UA: NEGATIVE
Leukocytes, UA: NEGATIVE
Nitrite, UA: NEGATIVE
Protein, UA: NEGATIVE
Spec Grav, UA: 1.02 (ref 1.010–1.025)
Urobilinogen, UA: 0.2 E.U./dL
pH, UA: 6 (ref 5.0–8.0)

## 2018-11-05 MED ORDER — FLUCONAZOLE 150 MG PO TABS: 150.0000 mg | ORAL_TABLET | Freq: Once | ORAL | 0 refills | Status: AC

## 2018-11-05 MED ORDER — METRONIDAZOLE 500 MG PO TABS: 500.0000 mg | ORAL_TABLET | Freq: Two times a day (BID) | ORAL | 0 refills | Status: DC

## 2018-11-05 NOTE — Telephone Encounter (Signed)
No problems post procedure. 

## 2018-11-05 NOTE — Progress Notes (Signed)
Subjective:    Patient ID: Anita Gibbs, female    DOB: 04-Dec-1970, 48 y.o.   MRN: [id number]  HPI  Patient presents to clinic complaining of vaginal discharge and vaginal itching for about 1 week.  Patient did finish treatment for syphilis via [place] last month.  Patient also has had bacterial vaginosis in the past, treated with Flagyl and believes this is the same thing.  Denies any new sexual partners.  When having sex, does use condoms.  Denies any issues with urination.  Denies any trouble with bowel movements.  Denies any fever or chills. Patient Active Problem List   Diagnosis Date Noted  . Neurogenic pain 10/27/2018  . Chronic musculoskeletal pain 10/27/2018  . History of allergy to shellfish 09/28/2018  . Chronic upper extremity pain (Secondary Area of Pain) (Bilateral) (R>L) 09/27/2018  . Cervicalgia (Primary Area of Pain) (Bilateral) (L>R) 09/27/2018  . History of fusion of cervical spine (ACDF C4-C7) 09/27/2018  . Abnormal MRI, cervical spine (06/10/2018) 09/27/2018  . 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 Cervical facet joint syndrome (Bilateral) (L>R) 09/27/2018  . Cervical facet hypertrophy (C3-T1) 09/27/2018  . 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 Chronic sacroiliac joint dysfunction (Bilateral) 09/27/2018  . 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  . Somatic dysfunction of sacroiliac joint (Bilateral) 09/27/2018  . DDD (degenerative disc disease), cervical 09/27/2018  . Chronic neck pain (Bilateral) w/ history of cervical spinal surgery 09/27/2018  . Spondylosis, cervical, w/ myelopathy 09/27/2018  . Cervical spondylosis 09/27/2018  . DDD (degenerative disc disease), lumbar 09/27/2018  . Vitamin D deficiency 09/27/2018  . Elevated sed rate 09/08/2018  . Chronic neck pain (Primary Area of Pain) (Bilateral) (L>R) 09/07/2018  . Chronic low back pain [facility]  Area of Pain) (Bilateral) (R>L) w/ sciatica (Bilateral) 09/07/2018  . Chronic lower extremity pain (Fourth Area of Pain) (Bilateral) (R>L) 09/07/2018  . Chronic pain syndrome 09/07/2018  . Pharmacologic therapy 09/07/2018  . Disorder of skeletal system 09/07/2018  . Problems influencing health status 09/07/2018  . Chronic sacroiliac joint pain (Right) 09/07/2018  . Chronic low back pain [facility] Area of Pain) (Bilateral) (R>L) w/o sciatica 07/26/2018  . Essential hypertension 05/31/2018  . Chronic gout of foot (Left) 05/31/2018  . Numbness and tingling of both feet 05/31/2018  . Hyperkalemia 05/31/2018  . Cervical myelopathy ([place]) 07/01/2017   Social History   Tobacco Use  . Smoking status: Never Smoker  . Smokeless tobacco: Never Used  Substance Use Topics  . Alcohol use: Yes    Comment: occ   Review of Systems  Constitutional: Negative for chills, fatigue and fever.  HENT: Negative for congestion, ear pain, sinus pain and sore throat.   Eyes: Negative.   Respiratory: Negative for cough, shortness of breath and wheezing.   Cardiovascular: Negative for chest pain, palpitations and leg swelling.  Gastrointestinal: Negative for abdominal pain, diarrhea, nausea and vomiting.  Genitourinary: Negative for dysuria, frequency and urgency. +vaginal itch/discharge Musculoskeletal: Negative for arthralgias and myalgias.  Skin: Negative for color change, pallor and rash.  Neurological: Negative for syncope, light-headedness and headaches.  Psychiatric/Behavioral: The patient is not nervous/anxious.       Objective:   Physical Exam Vitals signs and nursing note reviewed.  Constitutional:      General: She is not in acute distress.    Appearance: She is not toxic-appearing.  HENT:     Head: Normocephalic and atraumatic.  Cardiovascular:  Rate and Rhythm: Normal rate and regular rhythm.  Pulmonary:     Effort: Pulmonary effort is normal. No respiratory distress.     Breath  sounds: Normal breath sounds.  Abdominal:     General: Bowel sounds are normal. There is no distension.     Palpations: Abdomen is soft. There is no mass.     Tenderness: There is no abdominal tenderness. There is no guarding or rebound.     Hernia: No hernia is present.  Genitourinary:    Labia:        Right: No rash, tenderness, lesion or injury.        Left: No rash, tenderness, lesion or injury.      Vagina: No signs of injury and foreign body. Vaginal discharge (small amount of thin white discharge) present. No tenderness, bleeding or lesions.     Cervix: Normal.     Uterus: Not tender.      Adnexa:        Right: No tenderness.         Left: No tenderness.    Lymphadenopathy:     Lower Body: No right inguinal adenopathy. No left inguinal adenopathy.  Neurological:     Mental Status: She is alert and oriented to person, place, and time.     Gait: Gait normal.  Psychiatric:        Mood and Affect: Mood normal.        Behavior: Behavior normal.    Vitals:   11/05/18 1534  BP: 120/70  Pulse: 73  Temp: 99.5 F (37.5 C)  SpO2: 97%      Assessment & Plan:    A total of 25  minutes were spent face-to-face with the patient during this encounter and over half of that time was spent on counseling and coordination of care. The patient was counseled on testing we are doing today, GU care, safe sex practices.  Vaginal discharge/vaginal itching-patient's physical exam does appear consistent with a bacterial vaginosis.  We will treat her with Flagyl twice daily for 7 days.  Due to the itching we will also do Diflucan x1 dose.  Testing for gonorrhea chlamydia collected and sent to pharmacy.  Patient advised to keep up good fluid intake, wear cotton underwear, always wipe front to back after urinating, avoid the use of scented soaps when bathing genitourinary area.  Patient will keep regularly scheduled follow-up as planned.  She is aware she can return to clinic anytime if issues  arise.

## 2018-11-06 LAB — URINE CULTURE
MICRO NUMBER:: 287211
SPECIMEN QUALITY:: ADEQUATE

## 2018-11-07 LAB — GC/CHLAMYDIA PROBE AMP
Chlamydia trachomatis, NAA: NEGATIVE
Neisseria gonorrhoeae by PCR: NEGATIVE

## 2018-11-09 ENCOUNTER — Ambulatory Visit: Payer: BC Managed Care – PPO | Admitting: Physical Therapy

## 2018-11-11 ENCOUNTER — Ambulatory Visit: Payer: BC Managed Care – PPO | Admitting: Physical Therapy

## 2018-11-16 ENCOUNTER — Ambulatory Visit: Payer: BC Managed Care – PPO | Admitting: Physical Therapy

## 2018-11-18 ENCOUNTER — Ambulatory Visit: Payer: BC Managed Care – PPO | Admitting: Physical Therapy

## 2018-11-23 ENCOUNTER — Ambulatory Visit: Payer: BC Managed Care – PPO | Admitting: Physical Therapy

## 2018-11-25 ENCOUNTER — Encounter: Payer: BC Managed Care – PPO | Admitting: Physical Therapy

## 2018-11-26 ENCOUNTER — Telehealth: Payer: Self-pay | Admitting: Family Medicine

## 2018-11-26 NOTE — Telephone Encounter (Signed)
Can we set up web ex rather than phone call for mondays visit? Reimbursement is more. If patient has an email address and a smart phone or computer they can do web ex

## 2018-11-26 NOTE — Telephone Encounter (Signed)
The patient has been scheduled for a Webex visit.

## 2018-11-27 NOTE — Telephone Encounter (Signed)
Thanks

## 2018-11-29 ENCOUNTER — Other Ambulatory Visit: Payer: Self-pay

## 2018-11-29 ENCOUNTER — Ambulatory Visit: Payer: BC Managed Care – PPO | Attending: Pain Medicine | Admitting: Pain Medicine

## 2018-11-29 ENCOUNTER — Encounter: Payer: Self-pay | Admitting: Family Medicine

## 2018-11-29 ENCOUNTER — Ambulatory Visit (INDEPENDENT_AMBULATORY_CARE_PROVIDER_SITE_OTHER): Payer: BC Managed Care – PPO | Admitting: Family Medicine

## 2018-11-29 DIAGNOSIS — G894 Chronic pain syndrome: Secondary | ICD-10-CM | POA: Diagnosis not present

## 2018-11-29 DIAGNOSIS — J45998 Other asthma: Secondary | ICD-10-CM | POA: Diagnosis not present

## 2018-11-29 DIAGNOSIS — G8929 Other chronic pain: Secondary | ICD-10-CM

## 2018-11-29 DIAGNOSIS — I10 Essential (primary) hypertension: Secondary | ICD-10-CM | POA: Diagnosis not present

## 2018-11-29 DIAGNOSIS — J301 Allergic rhinitis due to pollen: Secondary | ICD-10-CM

## 2018-11-29 DIAGNOSIS — M47812 Spondylosis without myelopathy or radiculopathy, cervical region: Secondary | ICD-10-CM | POA: Diagnosis not present

## 2018-11-29 DIAGNOSIS — M7918 Myalgia, other site: Secondary | ICD-10-CM

## 2018-11-29 DIAGNOSIS — M542 Cervicalgia: Secondary | ICD-10-CM | POA: Diagnosis not present

## 2018-11-29 HISTORY — DX: Other asthma: J45.998

## 2018-11-29 HISTORY — DX: Allergic rhinitis due to pollen: J30.1

## 2018-11-29 MED ORDER — AMLODIPINE BESYLATE 5 MG PO TABS: 5.0000 mg | ORAL_TABLET | Freq: Every day | ORAL | 1 refills | Status: DC

## 2018-11-29 MED ORDER — METHOCARBAMOL 750 MG PO TABS: 750.0000 mg | ORAL_TABLET | Freq: Three times a day (TID) | ORAL | 0 refills | Status: DC | PRN

## 2018-11-29 MED ORDER — OXYCODONE HCL 5 MG PO TABS: 5.0000 mg | ORAL_TABLET | Freq: Every day | ORAL | 0 refills | Status: DC | PRN

## 2018-11-29 MED ORDER — OXYCODONE HCL 5 MG PO TABS: 5.0000 mg | ORAL_TABLET | Freq: Every day | ORAL | 0 refills | Status: DC

## 2018-11-29 NOTE — Patient Instructions (Signed)

## 2018-11-29 NOTE — Progress Notes (Addendum)
Virtual Visit via Telephone Note  I connected with Anita Gibbs on 11/29/18 at  9:00 AM EDT by telephone and verified that I am speaking with the correct person using two identifiers.  We originally were scheduled to do a WebEx video visit, but patient was unable to get the video portion working on her end even after trying to go through steps coaching pateint to help her get set video set up; not successful.  We changed form of visit to a telephone call visit.   I discussed the limitations, risks, security and privacy concerns of performing an evaluation and management service by telephone and the availability of in person appointments. I also discussed with the patient that there may be a patient responsible charge related to this service. The patient expressed understanding and agreed to proceed.  Patient present on the call, located at her home.   Philis Nettle FNP present on the call; located at [facility]   History of Present Illness:  Patient called via phone today to discuss her blood pressure and also to discuss seasonal allergic rhinitis, seasonal asthma.  Blood pressures have been running very good at home with combination of losartan and amlodipine.  Patient states they have been running mainly in the 120s to 130s over 70s 80s.  BP measured today by patient and is 133/72.  Denies any issues with chest pain, shortness of breath, cough, wheezing, lower extremity swelling, or palpitations.  Patient is starting to notice her nasal congestion kick up a little bit due to thick pollen.  Patient does have a history of seasonal allergic rhinitis from pollen and sometimes a seasonal asthma flareup.  Has not had issues with any breathing yet, but wants to be prepared.  Patient was prescribed an albuterol inhaler to use as needed at last visit, has not needed yet.  Also was prescribed a singular tablet at last visit, but has not started taking this medication yet.  Denies any fever or  chills.  Denies any fatigue or body aches.  Denies any travel outside of the [place] area.    Observations/Objective:  Patient sounds to be in no distress while speaking on the phone.  She is speaking fluently without any issues.  She is alert and oriented.  Patient was able to measure her own BP at home this morning with reading of 133/72.  Assessment and Plan:  Essential hypertension-patient will continue losartan and amlodipine as prescribed.  These medications seem to be working quite well together.  She will also work on healthy diet and regular exercise discussed diet full of lean proteins and lots of vegetables and lower carbs and processed sugars.  We will plan for her to get blood work rechecked at her next follow-up visit.  BP medication refill sent into pharmacy for patient.  Seasonal allergic rhinitis/seasonal asthma-patient encouraged to begin taking the Singulair that was sent in at last visit every night, also discussed adding on a daily Zyrtec, Claritin or Allegra if seasonal allergies do become bad.  Patient also advised she does have the albuterol inhaler on hand if needed for any breakthrough shortness of breath or wheezing. Discussed good handwashing, not touching face, remaining home as much as possible  Follow Up Instructions:  Patient will follow-up here in approximately 3 months for recheck of chronic medical conditions and we will plan to do blood work at that time.  Patient aware she can call office if anything is needed and she can return to clinic  sooner if any issues arise.  I discussed the assessment and treatment plan with the patient. The patient was provided an opportunity to ask questions and all were answered. The patient agreed with the plan and demonstrated an understanding of the instructions.   The patient was advised to call back or seek an in-person evaluation if the symptoms worsen or if the condition fails to improve as  anticipated.  I provided 15 minutes of non-face-to-face time during this encounter.   Jodelle Green, FNP

## 2018-11-29 NOTE — Telephone Encounter (Signed)
Called Pt and scheduled her a Follow-up appt for 03/01/2019 @ 8:20am.

## 2018-11-29 NOTE — Progress Notes (Signed)
Pain Management Encounter Note - Virtual Visit via Telephone Telehealth (real-time audio visits between healthcare provider and patient).  Patient's Phone No.:  [phone] (home); [phone] (mobile); (Preferred) [phone]  Pre-screening note:  Our staff contacted Anita Gibbs and offered her an "in person", "face-to-face" appointment versus a telephone encounter. She indicated preferring the telephone encounter, at this time.  Reason for Virtual Visit: COVID-19*  Social distancing based on CDC ans AMA recommendations.   I contacted Anita Gibbs on 11/29/2018 at 2:39 PM by telephone and clearly identified myself as Gaspar Cola, MD. I verified that I was speaking with the correct person using two identifiers (Name and date of birth: Aug 02, 1971).  Advanced Informed Consent I sought verbal advanced consent from Anita Gibbs for telemedicine interactions and virtual visit. I informed Anita Gibbs of the security and privacy concerns, risks, and limitations associated with performing an evaluation and management service by telephone. I also informed Anita Gibbs of the availability of "in person" appointments and I informed her of the possibility of a patient responsible charge related to this service. Anita Gibbs expressed understanding and agreed to proceed.   Historic Elements   Anita Gibbs is a 48 y.o. year old, female patient evaluated today after her last encounter by our practice on 11/05/2018. Anita Gibbs  has a past medical history of Arthritis, Carpal boss, right, Carpal tunnel syndrome, bilateral, Headache, Hypertension, and Seasonal asthma (11/29/2018). She also  has a past surgical history that includes Knee arthroscopy (Right) and Anterior cervical decomp/discectomy fusion (N/A, 07/01/2017). Anita Gibbs has a current medication list which includes the following prescription(s): albuterol, amlodipine, chlorpheniramine-hydrocodone, vitamin d3, diphenhydramine, epinephrine,  famotidine, fluticasone, losartan, magnesium, methocarbamol, metronidazole, montelukast, oxycodone, oxycodone, and oxycodone. She  reports that she has never smoked. She has never used smokeless tobacco. She reports current alcohol use. She reports that she does not use drugs. Anita Gibbs is allergic to other; shellfish allergy; strawberry (diagnostic); and tomato.   HPI  I last saw her on 11/04/2018. She is being evaluated for both, medication management and a post-procedure assessment.  Post-Procedure Evaluation  Procedure: Diagnostic/therapeutic cervical epidural steroid injection #2 under fluoroscopic guidance and IV sedation Pre-procedure pain level:  5/10 Post-procedure: 0/10          Sedation: Please see nurses note.  Effectiveness during initial hour after procedure(Ultra-Short Term Relief): 100 %  Local anesthetic used: Long-acting (4-6 hours) Effectiveness: Defined as any analgesic benefit obtained secondary to the administration of local anesthetics. This carries significant diagnostic value as to the etiological location, or anatomical origin, of the pain. Duration of benefit is expected to coincide with the duration of the local anesthetic used.  Effectiveness during initial 4-6 hours after procedure(Short-Term Relief): 90 %  Long-term benefit: Defined as any relief past the pharmacologic duration of the local anesthetics.  Effectiveness past the initial 6 hours after procedure(Long-Term Relief): 75 % x 2-3 days  Current benefits: Defined as benefit that persist at this time.   Analgesia:  <50% better Function: Back to baseline ROM: Back to baseline  Note: Based on the description of the patient's problem, it would seem that the pain may be coming from the facet joints as opposed to intraspinal.  Pharmacotherapy Assessment  Analgesic: Oxycodone IR 5 mg 1 tablet p.o. daily MME/day: 7.5 mg/day.   Monitoring: Pharmacotherapy: No side-effects or adverse reactions reported. [place]  PMP: PDMP not reviewed this encounter.       Compliance: No problems identified. Plan: Refer to "POC".  Review of recent tests  DG C-Arm 1-60 Min-No Report Fluoroscopy was utilized by the requesting physician.  No radiographic  interpretation.    Office Visit on 11/05/2018  Component Date Value Ref Range Status  . Chlamydia trachomatis, NAA 11/05/2018 Negative  Negative Final  . Neisseria gonorrhoeae by PCR 11/05/2018 Negative  Negative Final  . Color, UA 11/05/2018 dk yellow   Final  . Clarity, UA 11/05/2018 cloudy   Final  . Glucose, UA 11/05/2018 Negative  Negative Final  . Bilirubin, UA 11/05/2018 neg   Final  . Ketones, UA 11/05/2018 neg   Final  . Spec Grav, UA 11/05/2018 1.020  1.010 - 1.025 Final  . Blood, UA 11/05/2018 2+   Final  . pH, UA 11/05/2018 6.0  5.0 - 8.0 Final  . Protein, UA 11/05/2018 Negative  Negative Final  . Urobilinogen, UA 11/05/2018 0.2  0.2 or 1.0 E.U./dL Final  . Nitrite, UA 11/05/2018 neg   Final  . Leukocytes, UA 11/05/2018 Negative  Negative Final  . Appearance 11/05/2018 cloudy   Final  . Odor 11/05/2018 none   Final  . MICRO NUMBER: 11/05/2018 [id number]   Final  . SPECIMEN QUALITY: 11/05/2018 Adequate   Final  . Sample Source 11/05/2018 NOT GIVEN   Final  . STATUS: 11/05/2018 FINAL   Final  . Result: 11/05/2018 Multiple organisms present, each less than 10,000 CFU/mL. These organisms, commonly found on external and internal genitalia, are considered to be colonizers. No further testing performed.   Final   Assessment  The primary encounter diagnosis was Cervical facet hypertrophy (C3-T1). Diagnoses of Cervical facet joint syndrome (Bilateral) (L>R), Chronic neck pain (Primary Area of Pain) (Bilateral) (L>R), Chronic pain syndrome, and Chronic musculoskeletal pain were also pertinent to this visit.  Plan of Care  I discussed the assessment and treatment plan with the patient. The patient was provided an opportunity to ask questions and all were  answered. The patient agreed with the plan and demonstrated an understanding of the instructions.  Patient advised to call back or seek an in-person evaluation if the symptoms or condition worsens.  I am having Anita Gibbs. Anita Gibbs start on oxyCODONE, oxyCODONE, and methocarbamol. I am also having her maintain her diphenhydrAMINE, famotidine, EPINEPHrine, losartan, Vitamin D3, Magnesium, montelukast, fluticasone, albuterol, chlorpheniramine-HYDROcodone, metroNIDAZOLE, and oxyCODONE. Pharmacotherapy (Medications Ordered): Meds ordered this encounter  Medications  . oxyCODONE (OXY IR/ROXICODONE) 5 MG immediate release tablet    Sig: Take 1 tablet (5 mg total) by mouth daily for 30 days. Must last 30 days.    Dispense:  30 tablet    Refill:  0    [place] STOP ACT - Not applicable to Chronic Pain Syndrome (G89.4) diagnosis. Fill one day early if pharmacy is closed on scheduled refill date. Do not fill until: 12/01/18. To last until: 12/31/18.  Marland Kitchen oxyCODONE (OXY IR/ROXICODONE) 5 MG immediate release tablet    Sig: Take 1 tablet (5 mg total) by mouth daily as needed for up to 30 days for severe pain.    Dispense:  30 tablet    Refill:  0    [place] STOP ACT - Not applicable to Chronic Pain Syndrome (G89.4) diagnosis. Fill one day early if pharmacy is closed on scheduled refill date. Do not fill until: 01/30/19. To last until: 03/01/19.  Marland Kitchen oxyCODONE (OXY IR/ROXICODONE) 5 MG immediate release tablet    Sig: Take 1 tablet (5 mg total) by mouth daily as needed for up to 30 days for severe pain. Must last  30 days.    Dispense:  30 tablet    Refill:  0    [place] STOP ACT - Not applicable to Chronic Pain Syndrome (G89.4) diagnosis. Fill one day early if pharmacy is closed on scheduled refill date. Do not fill until: 12/31/18. To last until: 01/30/19.  Marland Kitchen methocarbamol (ROBAXIN) 750 MG tablet    Sig: Take 1 tablet (750 mg total) by mouth every 8 (eight) hours as needed for muscle spasms.    Dispense:  90 tablet    Refill:   0    Do not place this medication, or any other prescription from our practice, on "Automatic Refill". Patient may have prescription filled one day early if pharmacy is closed on scheduled refill date.   Orders:  Orders Placed This Encounter  Procedures  . CERVICAL FACET (MEDIAL BRANCH NERVE BLOCK)     CLINICAL INDICATIONS: Neck pain. Indications: Cervical Facet Syndrome.    Standing Status:   Standing    Number of Occurrences:   2    Standing Expiration Date:   05/31/2020    Scheduling Instructions:     LATERALITY: Bilateral     Level: C3-4, C4-5, C5-6 Facet joints (C3, C4, C5, C6, & C7 Medial Branch Nerves)     SEDATION: Patient's choice.     TIMEFRAME: PRN procedure. (Ms. Catalina will call when needed.)    Order Specific Question:   Where will this procedure be performed?    Answer:   [facility]   Follow-up plan:   Return in about 3 months (around 03/01/2019) for Med-Mgmt, w/ Dr. Dossie Arbour, PRN Procedure(s): (B) C-FCT BLK #1 (w/ sedation).    Total duration of non-face-to-face encounter: 15 minutes.  Note by: Gaspar Cola, MD Date: 11/29/2018; Time: 2:39 PM  Disclaimer:  * Given the special circumstances of the COVID-19 pandemic, the federal government has announced that the Office for Civil Rights (OCR) will exercise its enforcement discretion and will not impose penalties on physicians using telehealth in the event of noncompliance with regulatory requirements under the [place] and Accountability Act (HIPAA) in connection with the good faith provision of telehealth during the IHWTU-88 national public health emergency. ([place])

## 2018-11-29 NOTE — Telephone Encounter (Signed)
You're welcome!

## 2018-11-29 NOTE — Addendum Note (Signed)
Addended by: Philis Nettle on: 11/29/2018 01:21 PM   Modules accepted: Level of Service

## 2018-11-29 NOTE — Telephone Encounter (Signed)
Please call to schedule 3 month follow up office visit with me for patient for her BP

## 2018-11-30 ENCOUNTER — Encounter: Payer: BC Managed Care – PPO | Admitting: Physical Therapy

## 2018-12-01 ENCOUNTER — Telehealth: Payer: Self-pay | Admitting: Pain Medicine

## 2018-12-01 NOTE — Telephone Encounter (Signed)
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 She wants to know if she can pick up a note saying she cannot lift more than 25 lbs. She is having back and leg pain and spasms. Please call patient.

## 2018-12-01 NOTE — Telephone Encounter (Signed)
Patient advised that our office does not determine specific limitations, and cannot issue a note stating any limitations.

## 2018-12-02 ENCOUNTER — Encounter: Payer: BC Managed Care – PPO | Admitting: Physical Therapy

## 2018-12-07 ENCOUNTER — Encounter: Payer: BC Managed Care – PPO | Admitting: Physical Therapy

## 2018-12-09 ENCOUNTER — Encounter: Payer: BC Managed Care – PPO | Admitting: Physical Therapy

## 2018-12-10 ENCOUNTER — Encounter: Payer: BC Managed Care – PPO | Admitting: Physical Therapy

## 2018-12-14 ENCOUNTER — Encounter: Payer: BC Managed Care – PPO | Admitting: Physical Therapy

## 2018-12-16 ENCOUNTER — Encounter: Payer: BC Managed Care – PPO | Admitting: Physical Therapy

## 2018-12-21 ENCOUNTER — Encounter: Payer: BC Managed Care – PPO | Admitting: Physical Therapy

## 2018-12-23 ENCOUNTER — Encounter: Payer: BC Managed Care – PPO | Admitting: Physical Therapy

## 2018-12-23 ENCOUNTER — Telehealth: Payer: Self-pay | Admitting: *Deleted

## 2018-12-23 ENCOUNTER — Other Ambulatory Visit: Payer: Self-pay | Admitting: Pain Medicine

## 2018-12-23 DIAGNOSIS — M5441 Lumbago with sciatica, right side: Principal | ICD-10-CM

## 2018-12-23 DIAGNOSIS — M5442 Lumbago with sciatica, left side: Principal | ICD-10-CM

## 2018-12-23 DIAGNOSIS — G8929 Other chronic pain: Secondary | ICD-10-CM

## 2018-12-23 MED ORDER — METHYLPREDNISOLONE 4 MG PO TBPK: ORAL_TABLET | ORAL | 0 refills | Status: AC

## 2018-12-23 NOTE — Telephone Encounter (Signed)
Having sharp pain in lower back, with numbness in right leg. Numbness is continually getting worse. Is there anything that can be done at this time?

## 2018-12-23 NOTE — Telephone Encounter (Signed)
Call and inform patient that I called a medrol dose pack.

## 2018-12-28 ENCOUNTER — Encounter: Payer: BC Managed Care – PPO | Admitting: Physical Therapy

## 2018-12-30 ENCOUNTER — Encounter: Payer: BC Managed Care – PPO | Admitting: Physical Therapy

## 2019-01-27 ENCOUNTER — Other Ambulatory Visit: Payer: Self-pay | Admitting: Family Medicine

## 2019-01-27 DIAGNOSIS — Z9109 Other allergy status, other than to drugs and biological substances: Secondary | ICD-10-CM

## 2019-01-31 ENCOUNTER — Other Ambulatory Visit: Payer: Self-pay | Admitting: Pain Medicine

## 2019-01-31 DIAGNOSIS — G8929 Other chronic pain: Secondary | ICD-10-CM

## 2019-01-31 DIAGNOSIS — M7918 Myalgia, other site: Secondary | ICD-10-CM

## 2019-02-17 ENCOUNTER — Encounter: Payer: Self-pay | Admitting: Pain Medicine

## 2019-02-21 ENCOUNTER — Ambulatory Visit: Payer: BC Managed Care – PPO | Attending: Pain Medicine | Admitting: Pain Medicine

## 2019-02-21 ENCOUNTER — Other Ambulatory Visit: Payer: Self-pay | Admitting: Pain Medicine

## 2019-02-21 ENCOUNTER — Other Ambulatory Visit: Payer: Self-pay

## 2019-02-21 DIAGNOSIS — G894 Chronic pain syndrome: Secondary | ICD-10-CM

## 2019-02-21 DIAGNOSIS — M7918 Myalgia, other site: Secondary | ICD-10-CM

## 2019-02-21 DIAGNOSIS — M79601 Pain in right arm: Secondary | ICD-10-CM

## 2019-02-21 DIAGNOSIS — M79602 Pain in left arm: Secondary | ICD-10-CM

## 2019-02-21 DIAGNOSIS — M542 Cervicalgia: Secondary | ICD-10-CM

## 2019-02-21 DIAGNOSIS — M79604 Pain in right leg: Secondary | ICD-10-CM

## 2019-02-21 DIAGNOSIS — G8929 Other chronic pain: Secondary | ICD-10-CM

## 2019-02-21 DIAGNOSIS — M5442 Lumbago with sciatica, left side: Secondary | ICD-10-CM

## 2019-02-21 DIAGNOSIS — M5441 Lumbago with sciatica, right side: Secondary | ICD-10-CM

## 2019-02-21 DIAGNOSIS — M79605 Pain in left leg: Secondary | ICD-10-CM

## 2019-02-21 MED ORDER — OXYCODONE HCL 5 MG PO TABS: 5.0000 mg | ORAL_TABLET | Freq: Every day | ORAL | 0 refills | Status: DC | PRN

## 2019-02-21 MED ORDER — METHOCARBAMOL 750 MG PO TABS: 750.0000 mg | ORAL_TABLET | Freq: Three times a day (TID) | ORAL | 2 refills | Status: DC | PRN

## 2019-02-21 NOTE — Progress Notes (Signed)
Pain Management Virtual Encounter Note - Virtual Visit via Telephone Telehealth (real-time audio visits between healthcare provider and patient).   Patient's Phone No. & Preferred Pharmacy:  [phone] (home); [phone] (mobile); (Preferred) [phone] [email or web address]  [place], [place] - [address] [place] [address] Phone: [phone] Fax: [phone]    Pre-screening note:  Our staff contacted Anita Gibbs and offered her an "in person", "face-to-face" appointment versus a telephone encounter. She indicated preferring the telephone encounter, at this time.   Reason for Virtual Visit: COVID-19*  Social distancing based on CDC and AMA recommendations.   I contacted [name] on 02/21/2019 via telephone.      I clearly identified myself as Gaspar Cola, MD. I verified that I was speaking with the correct person using two identifiers (Name: Anita Gibbs, and date of birth: 09/17/1970).  Advanced Informed Consent I sought verbal advanced consent from [name] for virtual visit interactions. I informed Anita Gibbs of possible security and privacy concerns, risks, and limitations associated with providing "not-in-person" medical evaluation and management services. I also informed Anita Gibbs of the availability of "in-person" appointments. Finally, I informed her that there would be a charge for the virtual visit and that she could be  personally, fully or partially, financially responsible for it. Anita Gibbs expressed understanding and agreed to proceed.   Historic Elements   Anita Gibbs is a 48 y.o. year old, female patient evaluated today after her last encounter by our practice on 01/31/2019. [name]  has a past medical history of Arthritis, Carpal boss, right, Carpal tunnel syndrome, bilateral, Headache, Hypertension, and Seasonal asthma (11/29/2018). She also  has a past surgical history that includes  Knee arthroscopy (Right) and Anterior cervical decomp/discectomy fusion (N/A, 07/01/2017). [name] has a current medication list which includes the following prescription(s): albuterol, amlodipine, cetirizine hcl, vitamin d3, diphenhydramine, epinephrine, famotidine, fluticasone, losartan, magnesium, methocarbamol, metronidazole, montelukast, oxycodone, oxycodone, and oxycodone. She  reports that she has never smoked. She has never used smokeless tobacco. She reports current alcohol use. She reports that she does not use drugs. [name] is allergic to other; shellfish allergy; strawberry (diagnostic); and tomato.   HPI  Today, she is being contacted for medication management.  The patient indicates doing rather well on her medication regimen.  However, she feels that she needs to have another cervical epidural steroid injection.  She has had 2 of these injections and therefore we will go ahead and schedule her third as soon as possible.  Pharmacotherapy Assessment  Analgesic: Oxycodone IR 5 mg 1 tablet p.o. daily MME/day: 7.5 mg/day.   Monitoring: Pharmacotherapy: No side-effects or adverse reactions reported. [place] PMP: PDMP reviewed during this encounter.       Compliance: No problems identified. Effectiveness: Clinically acceptable. Plan: Refer to "POC".  Pertinent Labs   SAFETY SCREENING Profile Lab Results  Component Value Date   STAPHAUREUS NEGATIVE 06/25/2017   MRSAPCR NEGATIVE 06/25/2017   HIV NON-REACTIVE 09/06/2018   PREGTESTUR Negative 09/06/2018   Renal Function Lab Results  Component Value Date   BUN 11 09/23/2018   CREATININE 0.89 09/23/2018   BCR 13 09/07/2018   GFRAA 76 09/07/2018   GFRNONAA 66 09/07/2018   Hepatic Function Lab Results  Component Value Date   AST 10 09/07/2018   ALT 10 08/03/2018   ALBUMIN 4.3 09/07/2018   UDS Summary  Date Value Ref Range Status  09/07/2018 FINAL  Final  Comment:     ==================================================================== TOXASSURE COMP DRUG ANALYSIS,UR ==================================================================== Test                             Result       Flag       Units Drug Present and Declared for Prescription Verification   Oxycodone                      220          EXPECTED   ng/mg creat   Oxymorphone                    65           EXPECTED   ng/mg creat   Noroxycodone                   363          EXPECTED   ng/mg creat    Sources of oxycodone include scheduled prescription medications.    Oxymorphone and noroxycodone are expected metabolites of    oxycodone. Oxymorphone is also available as a scheduled    prescription medication.   Gabapentin                     PRESENT      EXPECTED   Methocarbamol                  PRESENT      EXPECTED   Diphenhydramine                PRESENT      EXPECTED Drug Absent but Declared for Prescription Verification   Acetaminophen                  Not Detected UNEXPECTED    Acetaminophen, as indicated in the declared medication list, is    not always detected even when used as directed.   Salicylate                     Not Detected UNEXPECTED    Aspirin, as indicated in the declared medication list, is not    always detected even when used as directed.   Naproxen                       Not Detected UNEXPECTED ==================================================================== Test                      Result    Flag   Units      Ref Range   Creatinine              207              mg/dL      >=20 ==================================================================== Declared Medications:  The flagging and interpretation on this report are based on the  following declared medications.  Unexpected results may arise from  inaccuracies in the declared medications.  **Note: The testing scope of this panel includes these medications:  Diphenhydramine (Benadryl)  Gabapentin  Methocarbamol  (Robaxin)  Naproxen (Naprosyn)  Oxycodone  **Note: The testing scope of this panel does not include small to  moderate amounts of these reported medications:  Acetaminophen (Excedrin)  Aspirin (Excedrin)  **Note: The testing scope of this panel does not include following  reported medications:  Albuterol  Caffeine (Excedrin)  Epinephrine (EpiPen)  Famotidine (Pepcid)  Losartan (Cozaar)  Metronidazole (Flagyl) ==================================================================== For clinical consultation, please call [phone]. ====================================================================    Note: Above Lab results reviewed.  Recent imaging  DG C-Arm 1-60 Min-No Report Fluoroscopy was utilized by the requesting physician.  No radiographic  interpretation.   Assessment  The primary encounter diagnosis was Chronic pain syndrome. 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  Plan of Care  I have discontinued Amada C. Lim's chlorpheniramine-HYDROcodone, oxyCODONE, and oxyCODONE. I have also changed her oxyCODONE. Additionally, I am having her start on oxyCODONE and oxyCODONE. Lastly, I am having her maintain her diphenhydrAMINE, famotidine, EPINEPHrine, losartan, Vitamin D3, Magnesium, fluticasone, albuterol, metroNIDAZOLE, amLODipine, montelukast, Cetirizine HCl (ZYRTEC PO), and methocarbamol.  Pharmacotherapy (Medications Ordered): Meds ordered this encounter  Medications  . oxyCODONE (OXY IR/ROXICODONE) 5 MG immediate release tablet    Sig: Take 1 tablet (5 mg total) by mouth daily as needed for up to 30 days for severe pain. Must last 30 days     Dispense:  30 tablet    Refill:  0    Chronic Pain: STOP Act - Not applicable. Fill 1 day early if closed on scheduled refill date. Do not fill until: 02/27/2019. To last until: 03/29/2019. Instruct to avoid benzodiazepines within 8 hours of opioid.  . methocarbamol (ROBAXIN) 750 MG tablet    Sig: Take 1 tablet (750 mg total) by mouth every 8 (eight) hours as needed for muscle spasms.    Dispense:  90 tablet    Refill:  2    Fill one day early if pharmacy is closed on scheduled refill date. May substitute for generic if available.  Marland Kitchen oxyCODONE (OXY IR/ROXICODONE) 5 MG immediate release tablet    Sig: Take 1 tablet (5 mg total) by mouth daily as needed for up to 30 days for severe pain. Must last 30 days    Dispense:  30 tablet    Refill:  0    Chronic Pain: STOP Act - Not applicable. Fill 1 day early if closed on scheduled refill date. Do not fill until: 03/29/2019. To last until: 04/28/2019. Instruct to avoid benzodiazepines within 8 hours of opioid.  Marland Kitchen oxyCODONE (OXY IR/ROXICODONE) 5 MG immediate release tablet    Sig: Take 1 tablet (5 mg total) by mouth daily as needed for up to 30 days for severe pain. Must last 30 days    Dispense:  30 tablet    Refill:  0    Chronic Pain: STOP Act - Not applicable. Fill 1 day early if closed on scheduled refill date. Do not fill until: 04/28/2019. To last until: 05/28/2019. Instruct to avoid benzodiazepines within 8 hours of opioid.   Orders:  Orders Placed This Encounter  Procedures  . Cervical Epidural Injection    Procedure: Cervical Epidural Steroid Injection/Block Purpose: Diagnostic Indication(s): Radiculitis and/or cervicalgia associater with cervical degenerative disc disease.    Standing Status:   Future    Standing Expiration Date:   02/21/2020    Scheduling Instructions:     Level(s): C7-T1     Laterality: Right-sided     Sedation: With Sedation.     Timeframe: ASAA    Order Specific Question:   Where will this procedure  be performed?     Answer:   [facility]    Comments:   by Dr. Dossie Arbour   Follow-up plan:   Return in about 3 months (around 05/18/2019) for (VV), E/M (MM), in addition, Procedure (w/ sedation): (R) CESI, (ASAP).    Recent Visits Date Type Provider Dept  11/29/18 Office Visit Milinda Pointer, MD [facility]  Showing recent visits within past 90 days and meeting all other requirements   Today's Visits Date Type Provider Dept  02/21/19 Office Visit Milinda Pointer, MD [facility]  Showing today's visits and meeting all other requirements   Future Appointments No visits were found meeting these conditions.  Showing future appointments within next 90 days and meeting all other requirements   I discussed the assessment and treatment plan with the patient. The patient was provided an opportunity to ask questions and all were answered. The patient agreed with the plan and demonstrated an understanding of the instructions.  Patient advised to call back or seek an in-person evaluation if the symptoms or condition worsens.  Total duration of non-face-to-face encounter: 15 minutes.  Note by: Gaspar Cola, MD Date: 02/21/2019; Time: 11:04 AM  Note: This dictation was prepared with Dragon dictation. Any transcriptional errors that may result from this process are unintentional.  Disclaimer:  * Given the special circumstances of the COVID-19 pandemic, the federal government has announced that the Office for Civil Rights (OCR) will exercise its enforcement discretion and will not impose penalties on physicians using telehealth in the event of noncompliance with regulatory requirements under the [place] and [place] (HIPAA) in connection with the good faith provision of telehealth during the IOXBD-53 national public health emergency. ([place])

## 2019-02-21 NOTE — Patient Instructions (Addendum)
____________________________________________________________________________________________  Preparing for Procedure with Sedation  Procedure appointments are limited to planned procedures: . No Prescription Refills. . No disability issues will be discussed. . No medication changes will be discussed.  Instructions: . Oral Intake: Do not eat or drink anything for at least 8 hours prior to your procedure. . Transportation: Public transportation is not allowed. Bring an adult driver. The driver must be physically present in our waiting room before any procedure can be started. . Physical Assistance: Bring an adult physically capable of assisting you, in the event you need help. This adult should keep you company at home for at least 6 hours after the procedure. . Blood Pressure Medicine: Take your blood pressure medicine with a sip of water the morning of the procedure. . Blood thinners: Notify our staff if you are taking any blood thinners. Depending on which one you take, there will be specific instructions on how and when to stop it. . Diabetics on insulin: Notify the staff so that you can be scheduled 1st case in the morning. If your diabetes requires high dose insulin, take only  of your normal insulin dose the morning of the procedure and notify the staff that you have done so. . Preventing infections: Shower with an antibacterial soap the morning of your procedure. . Build-up your immune system: Take 1000 mg of Vitamin C with every meal (3 times a day) the day prior to your procedure. . Antibiotics: Inform the staff if you have a condition or reason that requires you to take antibiotics before dental procedures. . Pregnancy: If you are pregnant, call and cancel the procedure. . Sickness: If you have a cold, fever, or any active infections, call and cancel the procedure. . Arrival: You must be in the facility at least 30 minutes prior to your scheduled procedure. . Children: Do not bring  children with you. . Dress appropriately: Bring dark clothing that you would not mind if they get stained. . Valuables: Do not bring any jewelry or valuables.  Reasons to call and reschedule or cancel your procedure: (Following these recommendations will minimize the risk of a serious complication.) . Surgeries: Avoid having procedures within 2 weeks of any surgery. (Avoid for 2 weeks before or after any surgery). . Flu Shots: Avoid having procedures within 2 weeks of a flu shots or . (Avoid for 2 weeks before or after immunizations). . Barium: Avoid having a procedure within 7-10 days after having had a radiological study involving the use of radiological contrast. (Myelograms, Barium swallow or enema study). . Heart attacks: Avoid any elective procedures or surgeries for the initial 6 months after a "Myocardial Infarction" (Heart Attack). . Blood thinners: It is imperative that you stop these medications before procedures. Let us know if you if you take any blood thinner.  . Infection: Avoid procedures during or within two weeks of an infection (including chest colds or gastrointestinal problems). Symptoms associated with infections include: Localized redness, fever, chills, night sweats or profuse sweating, burning sensation when voiding, cough, congestion, stuffiness, runny nose, sore throat, diarrhea, nausea, vomiting, cold or Flu symptoms, recent or current infections. It is specially important if the infection is over the area that we intend to treat. . Heart and lung problems: Symptoms that may suggest an active cardiopulmonary problem include: cough, chest pain, breathing difficulties or shortness of breath, dizziness, ankle swelling, uncontrolled high or unusually low blood pressure, and/or palpitations. If you are experiencing any of these symptoms, cancel your procedure and contact   your primary care physician for an evaluation.  Remember:  Regular Business hours are:  Monday to Thursday  8:00 AM to 4:00 PM  Provider's Schedule: Milinda Pointer, MD:  Procedure days: Tuesday and Thursday 7:30 AM to 4:00 PM  Gillis Santa, MD:  Procedure days: Monday and Wednesday 7:30 AM to 4:00 PM ____________________________________________________________________________________________   ____________________________________________________________________________________________  Medication Rules  Purpose: To inform patients, and their family members, of our rules and regulations.  Applies to: All patients receiving prescriptions (written or electronic).  Pharmacy of record: Pharmacy where electronic prescriptions will be sent. If written prescriptions are taken to a different pharmacy, please inform the nursing staff. The pharmacy listed in the electronic medical record should be the one where you would like electronic prescriptions to be sent.  Electronic prescriptions: In compliance with the [place] (STOP) Act of 2017 (Session Lanny Cramp [phone]), effective September 01, 2018, all controlled substances must be electronically prescribed. Calling prescriptions to the pharmacy will cease to exist.  Prescription refills: Only during scheduled appointments. Applies to all prescriptions.  NOTE: The following applies primarily to controlled substances (Opioid* Pain Medications).   Patient's responsibilities: 1. Pain Pills: Bring all pain pills to every appointment (except for procedure appointments). 2. Pill Bottles: Bring pills in original pharmacy bottle. Always bring the newest bottle. Bring bottle, even if empty. 3. Medication refills: You are responsible for knowing and keeping track of what medications you take and those you need refilled. The day before your appointment: write a list of all prescriptions that need to be refilled. The day of the appointment: give the list to the admitting nurse. Prescriptions will be written only during  appointments. No prescriptions will be written on procedure days. If you forget a medication: it will not be "Called in", "Faxed", or "electronically sent". You will need to get another appointment to get these prescribed. No early refills. Do not call asking to have your prescription filled early. 4. Prescription Accuracy: You are responsible for carefully inspecting your prescriptions before leaving our office. Have the discharge nurse carefully go over each prescription with you, before taking them home. Make sure that your name is accurately spelled, that your address is correct. Check the name and dose of your medication to make sure it is accurate. Check the number of pills, and the written instructions to make sure they are clear and accurate. Make sure that you are given enough medication to last until your next medication refill appointment. 5. Taking Medication: Take medication as prescribed. When it comes to controlled substances, taking less pills or less frequently than prescribed is permitted and encouraged. Never take more pills than instructed. Never take medication more frequently than prescribed.  6. Inform other Doctors: Always inform, all of your healthcare providers, of all the medications you take. 7. Pain Medication from other Providers: You are not allowed to accept any additional pain medication from any other Doctor or Healthcare provider. There are two exceptions to this rule. (see below) In the event that you require additional pain medication, you are responsible for notifying us, as stated below. 8. Medication Agreement: You are responsible for carefully reading and following our Medication Agreement. This must be signed before receiving any prescriptions from our practice. Safely store a copy of your signed Agreement. Violations to the Agreement will result in no further prescriptions. (Additional copies of our Medication Agreement are available upon request.) 9. Laws, Rules,  & Regulations: All patients are expected to follow all Federal  and [employer], Rules, & Regulations. Ignorance of the Laws does not constitute a valid excuse. The use of any illegal substances is prohibited. 10. Adopted CDC guidelines & recommendations: Target dosing levels will be at or below 60 MME/day. Use of benzodiazepines** is not recommended.  Exceptions: There are only two exceptions to the rule of not receiving pain medications from other Healthcare Providers. 1. Exception #1 (Emergencies): In the event of an emergency (i.e.: accident requiring emergency care), you are allowed to receive additional pain medication. However, you are responsible for: As soon as you are able, call our office [phone], at any time of the day or night, and leave a message stating your name, the date and nature of the emergency, and the name and dose of the medication prescribed. In the event that your call is answered by a member of our staff, make sure to document and save the date, time, and the name of the person that took your information.  2. Exception #2 (Planned Surgery): In the event that you are scheduled by another doctor or dentist to have any type of surgery or procedure, you are allowed (for a period no longer than 30 days), to receive additional pain medication, for the acute post-op pain. However, in this case, you are responsible for picking up a copy of our "Post-op Pain Management for Surgeons" handout, and giving it to your surgeon or dentist. This document is available at our office, and does not require an appointment to obtain it. Simply go to our office during business hours (Monday-Thursday from 8:00 AM to 4:00 PM) (Friday 8:00 AM to 12:00 Noon) or if you have a scheduled appointment with [place], prior to your surgery, and ask for it by name. In addition, you will need to provide [place] with your name, name of your surgeon, type of surgery, and date of procedure or surgery.  *Opioid  medications include: morphine, codeine, oxycodone, oxymorphone, hydrocodone, hydromorphone, meperidine, tramadol, tapentadol, buprenorphine, fentanyl, methadone. **Benzodiazepine medications include: diazepam (Valium), alprazolam (Xanax), clonazepam (Klonopine), lorazepam (Ativan), clorazepate (Tranxene), chlordiazepoxide (Librium), estazolam (Prosom), oxazepam (Serax), temazepam (Restoril), triazolam (Halcion) (Last updated: 10/29/2017) ____________________________________________________________________________________________

## 2019-02-25 ENCOUNTER — Other Ambulatory Visit: Payer: Self-pay

## 2019-02-25 ENCOUNTER — Other Ambulatory Visit
Admission: RE | Admit: 2019-02-25 | Discharge: 2019-02-25 | Disposition: A | Discharge status: Home / Self Care | Payer: BC Managed Care – PPO | Source: Ambulatory Visit | Attending: Pain Medicine | Admitting: Pain Medicine

## 2019-02-25 DIAGNOSIS — Z1159 Encounter for screening for other viral diseases: Secondary | ICD-10-CM | POA: Insufficient documentation

## 2019-02-26 LAB — NOVEL CORONAVIRUS, NAA (HOSP ORDER, SEND-OUT TO REF LAB; TAT 18-24 HRS): SARS-CoV-2, NAA: NOT DETECTED

## 2019-03-01 ENCOUNTER — Ambulatory Visit (INDEPENDENT_AMBULATORY_CARE_PROVIDER_SITE_OTHER): Payer: BC Managed Care – PPO | Admitting: Family Medicine

## 2019-03-01 ENCOUNTER — Ambulatory Visit
Admission: RE | Admit: 2019-03-01 | Discharge: 2019-03-01 | Disposition: A | Discharge status: Home / Self Care | Payer: BC Managed Care – PPO | Source: Ambulatory Visit | Attending: Pain Medicine | Admitting: Pain Medicine

## 2019-03-01 ENCOUNTER — Ambulatory Visit (HOSPITAL_BASED_OUTPATIENT_CLINIC_OR_DEPARTMENT_OTHER): Payer: BC Managed Care – PPO | Admitting: Pain Medicine

## 2019-03-01 ENCOUNTER — Encounter: Payer: Self-pay | Admitting: Pain Medicine

## 2019-03-01 ENCOUNTER — Other Ambulatory Visit: Payer: Self-pay

## 2019-03-01 VITALS — BP 132/60 | HR 50 | Temp 97.5°F | Resp 18 | Ht 63.0 in | Wt 228.0 lb

## 2019-03-01 DIAGNOSIS — I10 Essential (primary) hypertension: Secondary | ICD-10-CM | POA: Diagnosis not present

## 2019-03-01 DIAGNOSIS — G959 Disease of spinal cord, unspecified: Secondary | ICD-10-CM

## 2019-03-01 DIAGNOSIS — M5030 Other cervical disc degeneration, unspecified cervical region: Secondary | ICD-10-CM

## 2019-03-01 DIAGNOSIS — M542 Cervicalgia: Secondary | ICD-10-CM | POA: Insufficient documentation

## 2019-03-01 DIAGNOSIS — M4802 Spinal stenosis, cervical region: Secondary | ICD-10-CM | POA: Diagnosis present

## 2019-03-01 DIAGNOSIS — M47812 Spondylosis without myelopathy or radiculopathy, cervical region: Secondary | ICD-10-CM | POA: Insufficient documentation

## 2019-03-01 DIAGNOSIS — Z9109 Other allergy status, other than to drugs and biological substances: Secondary | ICD-10-CM | POA: Diagnosis not present

## 2019-03-01 MED ORDER — LIDOCAINE HCL 2 % IJ SOLN
20.0000 mL | Freq: Once | INTRAMUSCULAR | Status: AC
  Administered 2019-03-01: 400 mg

## 2019-03-01 MED ORDER — FENTANYL CITRATE (PF) 100 MCG/2ML IJ SOLN
INTRAMUSCULAR | Status: AC
  Filled 2019-03-01: qty 2

## 2019-03-01 MED ORDER — ROPIVACAINE HCL 2 MG/ML IJ SOLN
INTRAMUSCULAR | Status: AC
  Filled 2019-03-01: qty 10

## 2019-03-01 MED ORDER — FENTANYL CITRATE (PF) 100 MCG/2ML IJ SOLN
25.0000 ug | INTRAMUSCULAR | Status: DC | PRN
  Administered 2019-03-01: 50 ug via INTRAVENOUS

## 2019-03-01 MED ORDER — MIDAZOLAM HCL 5 MG/5ML IJ SOLN
INTRAMUSCULAR | Status: AC
  Filled 2019-03-01: qty 5

## 2019-03-01 MED ORDER — SODIUM CHLORIDE (PF) 0.9 % IJ SOLN
INTRAMUSCULAR | Status: AC
  Filled 2019-03-01: qty 10

## 2019-03-01 MED ORDER — DEXAMETHASONE SODIUM PHOSPHATE 10 MG/ML IJ SOLN
10.0000 mg | Freq: Once | INTRAMUSCULAR | Status: AC
  Administered 2019-03-01: 10 mg

## 2019-03-01 MED ORDER — DEXAMETHASONE SODIUM PHOSPHATE 10 MG/ML IJ SOLN
INTRAMUSCULAR | Status: AC
  Filled 2019-03-01: qty 1

## 2019-03-01 MED ORDER — ROPIVACAINE HCL 2 MG/ML IJ SOLN
1.0000 mL | Freq: Once | INTRAMUSCULAR | Status: AC
  Administered 2019-03-01: 1 mL via EPIDURAL

## 2019-03-01 MED ORDER — LACTATED RINGERS IV SOLN
1000.0000 mL | Freq: Once | INTRAVENOUS | Status: AC
  Administered 2019-03-01: 1000 mL via INTRAVENOUS

## 2019-03-01 MED ORDER — SODIUM CHLORIDE 0.9% FLUSH
1.0000 mL | Freq: Once | INTRAVENOUS | Status: AC
  Administered 2019-03-01: 1 mL

## 2019-03-01 MED ORDER — LIDOCAINE HCL 2 % IJ SOLN
INTRAMUSCULAR | Status: AC
  Filled 2019-03-01: qty 20

## 2019-03-01 MED ORDER — MIDAZOLAM HCL 5 MG/5ML IJ SOLN
1.0000 mg | INTRAMUSCULAR | Status: DC | PRN
  Administered 2019-03-01: 2 mg via INTRAVENOUS

## 2019-03-01 NOTE — Progress Notes (Signed)
Patient's Name: Anita Gibbs  MRN: [id number]  Referring Provider: Jodelle Green, FNP  DOB: 16-Sep-1970  PCP: Jodelle Green, FNP  DOS: 03/01/2019  Note by: Gaspar Cola, MD  Service setting: Ambulatory outpatient  Specialty: Interventional Pain Management  Patient type: Established  Location: [facility] (AMB) Pain Management Facility  Visit type: Interventional Procedure   Primary Reason for Visit: Interventional Pain Management Treatment. CC: Neck Pain  Procedure:          Anesthesia, Analgesia, Anxiolysis:  Type: Diagnostic, Inter-Laminar, Cervical Epidural Steroid Injection  #3  Region: Posterior Cervico-thoracic Region Level: C7-T1 Laterality: Right-Sided Paramedial  Type: Moderate (Conscious) Sedation combined with Local Anesthesia Indication(s): Analgesia and Anxiety Route: Intravenous (IV) IV Access: Secured Sedation: Meaningful verbal contact was maintained at all times during the procedure  Local Anesthetic: Lidocaine 1-2%  Position: Prone with head of the table was raised to facilitate breathing.   Indications: 1. DDD (degenerative disc disease), cervical   2. 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 Cervical spondylosis   5. Cervical myelopathy (HCC)   6. Cervicalgia (Primary Area of Pain) (Bilateral) (L>R)    Pain Score: Pre-procedure: 4 /10 Post-procedure: 0-No pain/10  Pre-op Assessment:  Anita Gibbs is a 48 y.o. (year old), female patient, seen today for interventional treatment. She  has a past surgical history that includes Knee arthroscopy (Right) and Anterior cervical decomp/discectomy fusion (N/A, 07/01/2017). Anita Gibbs has a current medication list which includes the following prescription(s): albuterol, amlodipine, cetirizine hcl, vitamin d3, diphenhydramine, epinephrine, famotidine, fluticasone, losartan, magnesium, methocarbamol, metronidazole, montelukast, oxycodone,  oxycodone, and oxycodone, and the following Facility-Administered Medications: fentanyl and midazolam. Her primarily concern today is the Neck Pain  Initial Vital Signs:  Pulse/HCG Rate: (!) 58ECG Heart Rate: 75 Temp: 99.2 F (37.3 C) Resp: 16 BP: 135/70 SpO2: 100 %  BMI: Estimated body mass index is 40.39 kg/m as calculated from the following:   Height as of this encounter: 5\' 3"  (1.6 m).   Weight as of this encounter: 228 lb (103.4 kg).  Risk Assessment: Allergies: Reviewed. She is allergic to other; shellfish allergy; strawberry (diagnostic); and tomato.  Allergy Precautions: None required Coagulopathies: Reviewed. None identified.  Blood-thinner therapy: None at this time Active Infection(s): Reviewed. None identified. Anita Gibbs is afebrile  Site Confirmation: Anita Gibbs was asked to confirm the procedure and laterality before marking the site Procedure checklist: Completed Consent: Before the procedure and under the influence of no sedative(s), amnesic(s), or anxiolytics, the patient was informed of the treatment options, risks and possible complications. To fulfill our ethical and legal obligations, as recommended by the American Medical Association's Code of Ethics, I have informed the patient of my clinical impression; the nature and purpose of the treatment or procedure; the risks, benefits, and possible complications of the intervention; the alternatives, including doing nothing; the risk(s) and benefit(s) of the alternative treatment(s) or procedure(s); and the risk(s) and benefit(s) of doing nothing. The patient was provided information about the general risks and possible complications associated with the procedure. These may include, but are not limited to: failure to achieve desired goals, infection, bleeding, organ or nerve damage, allergic reactions, paralysis, and death. In addition, the patient was informed of those risks and complications associated to Spine-related  procedures, such as failure to decrease pain; infection (i.e.: Meningitis, epidural or intraspinal abscess); bleeding (i.e.: epidural hematoma, subarachnoid hemorrhage, or any other type of intraspinal or  peri-dural bleeding); organ or nerve damage (i.e.: Any type of peripheral nerve, nerve root, or spinal cord injury) with subsequent damage to sensory, motor, and/or autonomic systems, resulting in permanent pain, numbness, and/or weakness of one or several areas of the body; allergic reactions; (i.e.: anaphylactic reaction); and/or death. Furthermore, the patient was informed of those risks and complications associated with the medications. These include, but are not limited to: allergic reactions (i.e.: anaphylactic or anaphylactoid reaction(s)); adrenal axis suppression; blood sugar elevation that in diabetics may result in ketoacidosis or comma; water retention that in patients with history of congestive heart failure may result in shortness of breath, pulmonary edema, and decompensation with resultant heart failure; weight gain; swelling or edema; medication-induced neural toxicity; particulate matter embolism and blood vessel occlusion with resultant organ, and/or nervous system infarction; and/or aseptic necrosis of one or more joints. Finally, the patient was informed that Medicine is not an exact science; therefore, there is also the possibility of unforeseen or unpredictable risks and/or possible complications that may result in a catastrophic outcome. The patient indicated having understood very clearly. We have given the patient no guarantees and we have made no promises. Enough time was given to the patient to ask questions, all of which were answered to the patient's satisfaction. Anita Gibbs has indicated that she wanted to continue with the procedure. Attestation: I, the ordering provider, attest that I have discussed with the patient the benefits, risks, side-effects, alternatives, likelihood of  achieving goals, and potential problems during recovery for the procedure that I have provided informed consent. Date  Time: 03/01/2019  8:44 AM  Pre-Procedure Preparation:  Monitoring: As per clinic protocol. Respiration, ETCO2, SpO2, BP, heart rate and rhythm monitor placed and checked for adequate function Safety Precautions: Patient was assessed for positional comfort and pressure points before starting the procedure. Time-out: I initiated and conducted the "Time-out" before starting the procedure, as per protocol. The patient was asked to participate by confirming the accuracy of the "Time Out" information. Verification of the correct person, site, and procedure were performed and confirmed by me, the nursing staff, and the patient. "Time-out" conducted as per Joint Commission's Universal Protocol (UP.01.01.01). Time: 0930  Description of Procedure:          Target Area: For Epidural Steroid injections the target is the interlaminar space, initially targeting the lower border of the superior vertebral body lamina. Approach: Paramedial approach. Area Prepped: Entire PosteriorCervical Region Prepping solution: DuraPrep (Iodine Povacrylex [0.7% available iodine] and Isopropyl Alcohol, 74% w/w) Safety Precautions: Aspiration looking for blood return was conducted prior to all injections. At no point did we inject any substances, as a needle was being advanced. No attempts were made at seeking any paresthesias. Safe injection practices and needle disposal techniques used. Medications properly checked for expiration dates. SDV (single dose vial) medications used. Description of the Procedure: Protocol guidelines were followed. The procedure needle was introduced through the skin, ipsilateral to the reported pain, and advanced to the target area. Bone was contacted and the needle walked caudad, until the lamina was cleared. The epidural space was identified using "loss-of-resistance technique" with 2-3  ml of PF-NaCl (0.9% NSS), in a 5cc LOR glass syringe. Vitals:   03/01/19 0936 03/01/19 0946 03/01/19 0957 03/01/19 1006  BP: 121/73 120/74 (!) 135/58 132/60  Pulse: (!) 50     Resp: 16 18 20 18   Temp:  (!) 97.5 F (36.4 C)  (!) 97.5 F (36.4 C)  SpO2: 98% 100% 100% 100%  Weight:      Height:        Start Time: 0936 hrs. End Time: 0936 hrs. Materials:  Needle(s) Type: Epidural needle Gauge: 17G Length: 3.5-in Medication(s): Please see orders for medications and dosing details.  Imaging Guidance (Spinal):          Type of Imaging Technique: Fluoroscopy Guidance (Spinal) Indication(s): Assistance in needle guidance and placement for procedures requiring needle placement in or near specific anatomical locations not easily accessible without such assistance. Exposure Time: Please see nurses notes. Contrast: Before injecting any contrast, we confirmed that the patient did not have an allergy to iodine, shellfish, or radiological contrast. Once satisfactory needle placement was completed at the desired level, radiological contrast was injected. Contrast injected under live fluoroscopy. No contrast complications. See chart for type and volume of contrast used. Fluoroscopic Guidance: I was personally present during the use of fluoroscopy. "Tunnel Vision Technique" used to obtain the best possible view of the target area. Parallax error corrected before commencing the procedure. "Direction-depth-direction" technique used to introduce the needle under continuous pulsed fluoroscopy. Once target was reached, antero-posterior, oblique, and lateral fluoroscopic projection used confirm needle placement in all planes. Images permanently stored in EMR. Interpretation: I personally interpreted the imaging intraoperatively. Adequate needle placement confirmed in multiple planes. Appropriate spread of contrast into desired area was observed. No evidence of afferent or efferent intravascular uptake. No  intrathecal or subarachnoid spread observed. Permanent images saved into the patient's record.  Antibiotic Prophylaxis:   Anti-infectives (From admission, onward)   None     Indication(s): None identified  Post-operative Assessment:  Post-procedure Vital Signs:  Pulse/HCG Rate: (!) 5070 Temp: (!) 97.5 F (36.4 C) Resp: 18 BP: 132/60 SpO2: 100 %  EBL: None  Complications: No immediate post-treatment complications observed by team, or reported by patient.  Note: The patient tolerated the entire procedure well. A repeat set of vitals were taken after the procedure and the patient was kept under observation following institutional policy, for this type of procedure. Post-procedural neurological assessment was performed, showing return to baseline, prior to discharge. The patient was provided with post-procedure discharge instructions, including a section on how to identify potential problems. Should any problems arise concerning this procedure, the patient was given instructions to immediately contact us, at any time, without hesitation. In any case, we plan to contact the patient by telephone for a follow-up status report regarding this interventional procedure.  Comments:  No additional relevant information.  Plan of Care  Orders:  Orders Placed This Encounter  Procedures  . Cervical Epidural Injection    Procedure: Cervical Epidural Steroid Injection/Block Purpose: Diagnostic Indication(s): Radiculitis and cervicalgia associater with cervical degenerative disc disease.    Scheduling Instructions:     Level(s): C7-T1     Laterality: Right-sided     Sedation: With Sedation.     Timeframe: Today    Order Specific Question:   Where will this procedure be performed?    Answer:   [facility]    Comments:   by Dr. Dossie Arbour  . [facility] C-ARM 1-60 MIN NO REPORT    Intraoperative interpretation by procedural physician at [place].    Standing Status:    Standing    Number of Occurrences:   1    Order Specific Question:   Reason for exam:    Answer:   Assistance in needle guidance and placement for procedures requiring needle placement in or near specific anatomical locations not easily  accessible without such assistance.  . Provider attestation of informed consent for procedure/surgical case    I, the ordering provider, attest that I have discussed with the patient the benefits, risks, side effects, alternatives, likelihood of achieving goals and potential problems during recovery for the procedure that I have provided informed consent.    Standing Status:   Standing    Number of Occurrences:   1  . Informed Consent Details: Transcribe to consent form and obtain patient signature    Standing Status:   Standing    Number of Occurrences:   1    Order Specific Question:   Procedure    Answer:   Cervical epidural steroid injection under fluoroscopic guidance. (See notes for level and laterality.)    Order Specific Question:   Surgeon    Answer:   Volney Reierson A. Dossie Arbour, MD    Order Specific Question:   Indication/Reason    Answer:   Neck pain and/or upper extremity pain secondary to cervical radiculitis/radiculopathy.  . Miscellanous precautions    NOTE: Although It is true that patients can have allergies to shellfish and that shellfish contain iodine, most shellfish  allergies are due to two protein allergens present in the shellfish: tropomyosins and parvalbumin. Not all patients with shellfish allergies are allergic to iodine. However, as a precaution, avoid using iodine containing products.    Standing Status:   Standing    Number of Occurrences:   1   Medications ordered for procedure: Meds ordered this encounter  Medications  . lidocaine (XYLOCAINE) 2 % (with pres) injection 400 mg  . lactated ringers infusion 1,000 mL  . midazolam (VERSED) 5 MG/5ML injection 1-2 mg    Make sure Flumazenil is available in the pyxis when using this  medication. If oversedation occurs, administer 0.2 mg IV over 15 sec. If after 45 sec no response, administer 0.2 mg again over 1 min; may repeat at 1 min intervals; not to exceed 4 doses (1 mg)  . fentaNYL (SUBLIMAZE) injection 25-50 mcg    Make sure Narcan is available in the pyxis when using this medication. In the event of respiratory depression (RR< 8/min): Titrate NARCAN (naloxone) in increments of 0.1 to 0.2 mg IV at 2-3 minute intervals, until desired degree of reversal.  . sodium chloride flush (NS) 0.9 % injection 1 mL  . ropivacaine (PF) 2 mg/mL (0.2%) (NAROPIN) injection 1 mL  . dexamethasone (DECADRON) injection 10 mg   Medications administered: We administered lidocaine, lactated ringers, midazolam, fentaNYL, sodium chloride flush, ropivacaine (PF) 2 mg/mL (0.2%), and dexamethasone.  See the medical record for exact dosing, route, and time of administration.  Disposition: Discharge home  Discharge Date & Time: 03/01/2019; 1007 hrs.   Follow-up plan:   Return for (VV), E/M (PP).     Recent Visits Date Type Provider Dept  02/21/19 Office Visit Milinda Pointer, MD [facility]  Showing recent visits within past 90 days and meeting all other requirements   Today's Visits Date Type Provider Dept  03/01/19 Procedure visit Milinda Pointer, MD [facility]  Showing today's visits and meeting all other requirements   Future Appointments Date Type Provider Dept  03/17/19 Appointment Milinda Pointer, MD [facility]  05/18/19 Appointment Milinda Pointer, MD [facility]  Showing future appointments within next 90 days and meeting all other requirements   Primary Care Physician: Jodelle Green, FNP Location: [facility] Note by: Gaspar Cola, MD Date: 03/01/2019; Time: 10:39  AM  Disclaimer:  Medicine is not an Chief Strategy Officer. The only guarantee in medicine is that nothing is guaranteed. It is  important to note that the decision to proceed with this intervention was based on the information collected from the patient. The Data and conclusions were drawn from the patient's questionnaire, the interview, and the physical examination. Because the information was provided in large part by the patient, it cannot be guaranteed that it has not been purposely or unconsciously manipulated. Every effort has been made to obtain as much relevant data as possible for this evaluation. It is important to note that the conclusions that lead to this procedure are derived in large part from the available data. Always take into account that the treatment will also be dependent on availability of resources and existing treatment guidelines, considered by other Pain Management Practitioners as being common knowledge and practice, at the time of the intervention. For Medico-Legal purposes, it is also important to point out that variation in procedural techniques and pharmacological choices are the acceptable norm. The indications, contraindications, technique, and results of the above procedure should only be interpreted and judged by a Board-Certified Interventional Pain Specialist with extensive familiarity and expertise in the same exact procedure and technique.

## 2019-03-01 NOTE — Progress Notes (Signed)
Patient ID: Anita Gibbs, female   DOB: May 15, 1971, 49 y.o.   MRN: [id number]    Virtual Visit via phone Note  This visit type was conducted due to national recommendations for restrictions regarding the COVID-19 pandemic (e.g. social distancing).  This format is felt to be most appropriate for this patient at this time.  All issues noted in this document were discussed and addressed.  No physical exam was performed (except for noted visual exam findings with Video Visits).   I connected with Garald Braver today at  8:20 AM EDT by a telephone and verified that I am speaking with the correct person using two identifiers. Location patient: home Location provider: work or home office Persons participating in the virtual visit: patient, provider  I discussed the limitations, risks, security and privacy concerns of performing an evaluation and management service by telephone and the availability of in person appointments. I also discussed with the patient that there may be a patient responsible charge related to this service. The patient expressed understanding and agreed to proceed.  HPI:  Patient and I connected via telephone today to follow-up on blood pressure, multiple environmental allergies and chronic neck pain.  Patient has been tolerating her losartan and amlodipine without any problems.  Denies any chest pain, shortness of breath, or extremity swelling or palpitations.  Has not checked her BP in the last couple weeks, but states did have a checked back in May and it was 136/82.  Patient's allergy symptoms are well controlled with using Singulair, Flonase and also daily Zyrtec or Claritin.  Patient also has a epidural procedure planned for later today to help control her chronic neck pain.  Patient is pleased she is going to be getting epidural and is hopeful it will help better control her pain.   ROS:   Review of Systems  Constitutional: Negative for chills, fatigue and fever.    HENT: Negative for congestion, ear pain, sinus pain and sore throat.   Eyes: Negative.   Respiratory: Negative for cough, shortness of breath and wheezing.   Cardiovascular: Negative for chest pain, palpitations and leg swelling.  Gastrointestinal: Negative for abdominal pain, diarrhea, nausea and vomiting.  Genitourinary: Negative for dysuria, frequency and urgency.  Musculoskeletal: +chronic neck pain  Skin: Negative for color change, pallor and rash.  Neurological: Negative for syncope, light-headedness and headaches.  Psychiatric/Behavioral: The patient is not nervous/anxious.     Past Medical History:  Diagnosis Date   Arthritis    Carpal boss, right    Carpal tunnel syndrome, bilateral    Headache    Hypertension    Seasonal asthma 11/29/2018    Past Surgical History:  Procedure Laterality Date   ANTERIOR CERVICAL DECOMP/DISCECTOMY FUSION N/A 07/01/2017   Procedure: ANTERIOR CERVICAL DECOMPRESSION/DISCECTOMY FUSION 3 LEVELS;  Surgeon: Meade Maw, MD;  Location: [facility];  Service: Neurosurgery;  Laterality: N/A;   KNEE ARTHROSCOPY Right     Family History  Problem Relation Age of Onset   [name] Mother    Diabetes Mother    Arthritis Mother    Hypertension Mother    Hyperlipidemia Mother    Hypertension Father    Arthritis Father    Hearing loss Father    Kidney disease Father    Arthritis Sister    Hypertension Sister    Cancer Brother    COPD Brother    Early death Brother    COPD Son    Depression Son    Breast cancer Maternal Aunt  Breast cancer Paternal Aunt    Allergic rhinitis Neg Hx    Angioedema Neg Hx    Eczema Neg Hx    Urticaria Neg Hx    Asthma Neg Hx    Social History   Tobacco Use   Smoking status: Never Smoker   Smokeless tobacco: Never Used  Substance Use Topics   Alcohol use: Yes    Comment: occ    Current Outpatient Medications:    albuterol (PROVENTIL HFA;VENTOLIN HFA) 108 (90  Base) MCG/ACT inhaler, Inhale 2 puffs into the lungs every 6 (six) hours as needed for wheezing or shortness of breath., Disp: 1 Inhaler, Rfl: 5   amLODipine (NORVASC) 5 MG tablet, Take 1 tablet (5 mg total) by mouth daily., Disp: 90 tablet, Rfl: 1   Cetirizine HCl (ZYRTEC PO), Take 25 mg by mouth., Disp: , Rfl:    Cholecalciferol (VITAMIN D3) 125 MCG (5000 UT) CAPS, Take 1 capsule (5,000 Units total) by mouth daily with breakfast. Take along with calcium and magnesium., Disp: 30 capsule, Rfl: 5   diphenhydrAMINE (BENADRYL) 25 mg capsule, Take 25 mg by mouth every 6 (six) hours as needed (for allergies.)., Disp: , Rfl:    EPINEPHrine (AUVI-Q) 0.3 mg/0.3 mL IJ SOAJ injection, Use as directed for severe allergic reaction, Disp: 2 Device, Rfl: 2   famotidine (PEPCID) 20 MG tablet, Take 1 tablet (20 mg total) by mouth 2 (two) times daily., Disp: 60 tablet, Rfl: 2   fluticasone (FLONASE) 50 MCG/ACT nasal spray, Place 2 sprays into both nostrils daily., Disp: 16 g, Rfl: 6   losartan (COZAAR) 100 MG tablet, Take 1 tablet (100 mg total) by mouth daily., Disp: 90 tablet, Rfl: 3   Magnesium 500 MG CAPS, Take 1 capsule (500 mg total) by mouth 2 (two) times daily at 8 am and 10 pm., Disp: 60 capsule, Rfl: 5   methocarbamol (ROBAXIN) 750 MG tablet, Take 1 tablet (750 mg total) by mouth every 8 (eight) hours as needed for muscle spasms., Disp: 90 tablet, Rfl: 2   metroNIDAZOLE (FLAGYL) 500 MG tablet, Take 1 tablet (500 mg total) by mouth 2 (two) times daily., Disp: 14 tablet, Rfl: 0   montelukast (SINGULAIR) 10 MG tablet, TAKE 1 TABLET BY MOUTH AT BEDTIME, Disp: 30 tablet, Rfl: 4   oxyCODONE (OXY IR/ROXICODONE) 5 MG immediate release tablet, Take 1 tablet (5 mg total) by mouth daily as needed for up to 30 days for severe pain. Must last 30 days, Disp: 30 tablet, Rfl: 0   [START ON 03/29/2019] oxyCODONE (OXY IR/ROXICODONE) 5 MG immediate release tablet, Take 1 tablet (5 mg total) by mouth daily as needed  for up to 30 days for severe pain. Must last 30 days, Disp: 30 tablet, Rfl: 0   [START ON 04/28/2019] oxyCODONE (OXY IR/ROXICODONE) 5 MG immediate release tablet, Take 1 tablet (5 mg total) by mouth daily as needed for up to 30 days for severe pain. Must last 30 days, Disp: 30 tablet, Rfl: 0  EXAM:  GENERAL: alert, oriented, sounds well and in no acute distress  LUNGS: Speaking in full sentences, no signs of respiratory distress, breathing rate appears normal, no obvious gross SOB, gasping, coughing or wheezing  PSYCH/NEURO: pleasant and cooperative, no obvious depression or anxiety, speech and thought processing grossly intact  ASSESSMENT AND PLAN:  Discussed the following assessment and plan:  Essential hypertension-BP well controlled on current medication regimen.  Tolerating losartan amlodipine with no issues.  She will continue.  Multiple environmental allergies-  allergy symptoms controlled at this time with Flonase, Claritin or Zyrtec in the a.m. and Singulair in the p.m.  Chronic neck pain- follows with pain management.  Will be getting epidural procedure today to help better control pain.    I discussed the assessment and treatment plan with the patient. The patient was provided an opportunity to ask questions and all were answered. The patient agreed with the plan and demonstrated an understanding of the instructions.   The patient was advised to call back or seek an in-person evaluation if the symptoms worsen or if the condition fails to improve as anticipated.  I provided 13 minutes of non-face-to-face over phone time during this encounter.  We will plan to have patient follow-up in the office in approximately 3 months for management of chronic issues.  She is aware she can call clinic or return to clinic sooner at any time if issues arise.  Jodelle Green, FNP

## 2019-03-01 NOTE — Progress Notes (Signed)
Safety precautions to be maintained throughout the outpatient stay will include: orient to surroundings, keep bed in low position, maintain call bell within reach at all times, provide assistance with transfer out of bed and ambulation.  

## 2019-03-01 NOTE — Patient Instructions (Signed)

## 2019-03-02 ENCOUNTER — Telehealth: Payer: Self-pay

## 2019-03-02 NOTE — Telephone Encounter (Signed)
Patient was called. She stated that she was little nausea this morning. She was told to monitor her condition and call us if she has other problems.

## 2019-03-16 ENCOUNTER — Encounter: Payer: Self-pay | Admitting: Pain Medicine

## 2019-03-16 NOTE — Patient Instructions (Signed)

## 2019-03-16 NOTE — Progress Notes (Signed)
Pain Management Virtual Encounter Note - Virtual Visit via Telephone Telehealth (real-time audio visits between healthcare provider and Anita Gibbs).   Anita Gibbs's Phone No. & Preferred Pharmacy:  [phone] (home); [phone] (mobile); (Preferred) [phone] [email or web address]  [place], [place] [address] Phone: [phone] Fax: [phone]    Pre-screening note:  Our staff contacted Anita Gibbs and offered her an "in person", "face-to-face" appointment versus a telephone encounter. Anita Gibbs indicated preferring the telephone encounter, at this time.   Reason for Virtual Visit: COVID-19*  Social distancing based on CDC and AMA recommendations.   I contacted Anita Gibbs on 03/17/2019 via telephone.      I clearly identified myself as Gaspar Cola, MD. I verified that I was speaking with the correct person using two identifiers (Name: Anita Gibbs, and date of birth: 28-Sep-1970).  Advanced Informed Consent I sought verbal advanced consent from Anita Gibbs for virtual visit interactions. I informed Anita Gibbs of possible security and privacy concerns, risks, and limitations associated with providing "not-in-person" medical evaluation and management services. I also informed Anita Gibbs of the availability of "in-person" appointments. Finally, I informed her that there would be a charge for the virtual visit and that Anita Gibbs could be  personally, fully or partially, financially responsible for it. Anita Gibbs expressed understanding and agreed to proceed.   Historic Elements   Anita Gibbs is a 48 y.o. year old, female Anita Gibbs evaluated today after her last encounter by our practice on 03/02/2019. Anita Gibbs  has a past medical history of Arthritis, Carpal boss, right, Carpal tunnel syndrome, bilateral, Headache, Hypertension, and Seasonal asthma (11/29/2018). Anita Gibbs also  has a past surgical history that includes  Knee arthroscopy (Right) and Anterior cervical decomp/discectomy fusion (N/A, 07/01/2017). Anita Gibbs has a current medication list which includes the following prescription(s): albuterol, amlodipine, cetirizine hcl, vitamin d3, diphenhydramine, epinephrine, famotidine, fluticasone, losartan, magnesium, methocarbamol, metronidazole, montelukast, oxycodone, oxycodone, and oxycodone. Anita Gibbs  reports that Anita Gibbs has never smoked. Anita Gibbs has never used smokeless tobacco. Anita Gibbs reports current alcohol use. Anita Gibbs reports that Anita Gibbs does not use drugs. Anita Gibbs is allergic to other; shellfish allergy; strawberry (diagnostic); and tomato.   HPI  Today, Anita Gibbs is being contacted for a post-procedure assessment.  The Anita Gibbs refers that for the duration of the local anesthetic Anita Gibbs had good relief, unfortunately Anita Gibbs did not get any long-term benefit from it.  Particularly in the area of the neck.  When questioned further about radicular symptoms Anita Gibbs indicated that the numbness that Anita Gibbs was experiencing in the upper extremities does not seem to be there as frequently as he used to be.  The pain in the upper extremities seem to have improved with the epidurals.  Reviewing the MRI below, there is evidence of multilevel uncovertebral joint (facet joint) arthropathy.  Prior physical exam testing was compatible with bilateral cervical facet disease.  At this time, I believe that Anita Gibbs would benefit from a diagnostic bilateral cervical facet block under fluoroscopic guidance and IV sedation.  The Anita Gibbs was informed of the reasoning behind this and Anita Gibbs understood and agreed.  Should the Anita Gibbs get good benefit from the diagnostic cervical facet blocks, the long-term goal is to apply radiofrequency, in the event of the relief being just temporary.  06/10/2018 cervical MRI FINDINGS: Vertebrae: Marrow signal is obscured by fusion hardware at C4, C5, C6, and C7.  Cord: Normal signal is present in the cervical and upper  thoracic spinal cord to  the lowest imaged level T3-4. Posterior Fossa, vertebral arteries, paraspinal tissues: A relatively empty sella is present. Intracranial contents are otherwise normal. Flow is present in the basilar artery. The craniocervical junction is otherwise normal. Disc levels: C2-3: Negative. C3-4: Negative. A mild broad-based disc osteophyte complex present. Uncovertebral spurring contributes to mild foraminal narrowing bilaterally. C4-5: A leftward disc osteophyte complex is present. Uncovertebral and facet disease contribute to mild left foraminal narrowing. There is partial effacement of ventral CSF. C5-6: Anterior fusion is present. Mild left foraminal narrowing is due to residual uncovertebral disease. C6-7: Anterior fusion is present. Mild left foraminal narrowing is due to residual uncovertebral disease. C7-T1: Minimal left-sided uncovertebral spurring is present without significant stenosis.  IMPRESSION: 1. Anterior fusion at C4-5, C5-6, and C6-7. 2. Mild residual central canal narrowing at C4-5. 3. Mild left foraminal narrowing at each of the fused levels. 4. Mild foraminal narrowing bilaterally at C3-4, adjacent to the fusion.  Post-Procedure Evaluation  Procedure: Diagnostic right-sided cervical epidural steroid injection #3 under fluoroscopic guidance and IV sedation Pre-procedure pain level:  4/10 Post-procedure: 0/10 (100% relief)  Sedation: Sedation provided.  Effectiveness during initial hour after procedure(Ultra-Short Term Relief): 100 %   Local anesthetic used: Long-acting (4-6 hours) Effectiveness: Defined as any analgesic benefit obtained secondary to the administration of local anesthetics. This carries significant diagnostic value as to the etiological location, or anatomical origin, of the pain. Duration of benefit is expected to coincide with the duration of the local anesthetic used.  Effectiveness during initial 4-6 hours after procedure(Short-Term Relief): 100 %(Anita Gibbs  stated that Anita Gibbs got no relief after numbness was gone)   Long-term benefit: Defined as any relief past the pharmacologic duration of the local anesthetics.  Effectiveness past the initial 6 hours after procedure(Long-Term Relief): 0 %   Current benefits: Defined as benefit that persist at this time.   Analgesia:  Back to baseline Function: Back to baseline ROM: Back to baseline  Pharmacotherapy Assessment  Analgesic: Oxycodone IR 5 mg, 1 tab PO QD (has enough to last until 05/28/2019) MME/day:7.5mg /day.   Monitoring: Pharmacotherapy: No side-effects or adverse reactions reported. [place] PMP: PDMP reviewed during this encounter.       Compliance: No problems identified. Effectiveness: Clinically acceptable. Plan: Refer to "POC".  Pertinent Labs   SAFETY SCREENING Profile Lab Results  Component Value Date   SARSCOV2NAA NOT DETECTED 02/25/2019   COVIDSOURCE NASOPHARYNGEAL 02/25/2019   STAPHAUREUS NEGATIVE 06/25/2017   MRSAPCR NEGATIVE 06/25/2017   HIV NON-REACTIVE 09/06/2018   PREGTESTUR Negative 09/06/2018   Renal Function Lab Results  Component Value Date   BUN 11 09/23/2018   CREATININE 0.89 09/23/2018   BCR 13 09/07/2018   GFRAA 76 09/07/2018   GFRNONAA 66 09/07/2018   Hepatic Function Lab Results  Component Value Date   AST 10 09/07/2018   ALT 10 08/03/2018   ALBUMIN 4.3 09/07/2018   UDS Summary  Date Value Ref Range Status  09/07/2018 FINAL  Final    Comment:    ==================================================================== TOXASSURE COMP DRUG ANALYSIS,UR ==================================================================== Test                             Result       Flag       Units Drug Present and Declared for Prescription Verification   Oxycodone  220          EXPECTED   ng/mg creat   Oxymorphone                    65           EXPECTED   ng/mg creat   Noroxycodone                   363          EXPECTED   ng/mg creat     Sources of oxycodone include scheduled prescription medications.    Oxymorphone and noroxycodone are expected metabolites of    oxycodone. Oxymorphone is also available as a scheduled    prescription medication.   Gabapentin                     PRESENT      EXPECTED   Methocarbamol                  PRESENT      EXPECTED   Diphenhydramine                PRESENT      EXPECTED Drug Absent but Declared for Prescription Verification   Acetaminophen                  Not Detected UNEXPECTED    Acetaminophen, as indicated in the declared medication list, is    not always detected even when used as directed.   Salicylate                     Not Detected UNEXPECTED    Aspirin, as indicated in the declared medication list, is not    always detected even when used as directed.   Naproxen                       Not Detected UNEXPECTED ==================================================================== Test                      Result    Flag   Units      Ref Range   Creatinine              207              mg/dL      >=20 ==================================================================== Declared Medications:  The flagging and interpretation on this report are based on the  following declared medications.  Unexpected results may arise from  inaccuracies in the declared medications.  **Note: The testing scope of this panel includes these medications:  Diphenhydramine (Benadryl)  Gabapentin  Methocarbamol (Robaxin)  Naproxen (Naprosyn)  Oxycodone  **Note: The testing scope of this panel does not include small to  moderate amounts of these reported medications:  Acetaminophen (Excedrin)  Aspirin (Excedrin)  **Note: The testing scope of this panel does not include following  reported medications:  Albuterol  Caffeine (Excedrin)  Epinephrine (EpiPen)  Famotidine (Pepcid)  Losartan (Cozaar)  Metronidazole (Flagyl) ==================================================================== For  clinical consultation, please call [phone]. ====================================================================    Note: Above Lab results reviewed.  Recent imaging  DG PAIN CLINIC C-ARM 1-60 MIN NO REPORT Fluoro was used, but no Radiologist interpretation will be provided.  Please refer to "NOTES" tab for provider progress note.     Assessment  The primary encounter diagnosis was Cervicalgia (Primary Area of Pain) (Bilateral) (L>R). 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  Plan of Care  I am having Anita Gibbs maintain her diphenhydrAMINE, famotidine, EPINEPHrine, losartan, Vitamin D3, Magnesium, fluticasone, albuterol, metroNIDAZOLE, amLODipine, montelukast, Cetirizine HCl (ZYRTEC PO), oxyCODONE, methocarbamol, oxyCODONE, and oxyCODONE.  Pharmacotherapy (Medications Ordered): No orders of the defined types were placed in this encounter.  Orders:  Orders Placed This Encounter  Procedures  . CERVICAL FACET (MEDIAL BRANCH NERVE BLOCK)     Standing Status:   Future    Standing Expiration Date:   04/16/2019    Scheduling Instructions:     Side: Bilateral     Level: C3-4, C4-5, C5-6 Facet joints (C3, C4, C5, C6, & C7 Medial Branch Nerves)     Sedation: With Sedation.     Timeframe: As soon as schedule allows    Order Specific Question:   Where will this procedure be performed?    Answer:   [facility]   Follow-up plan:   Return for Procedure (w/ sedation): (B) C-FCT Blk #1.      Interventional management options:  Considering:   Diagnostic bilateral cervicalfacet nerve block #1 Possible bilateral cervical facetRFA Diagnostic midline lumbarESI Diagnostic  bilateral lumbar facet nerve block Possible bilateral lumbar facetRFA Diagnostic right-sided sacroiliac joint injection Possible right-sided sacroiliac jointRFA   Palliative PRN treatment(s):   Palliative right-sided CESI #4     Recent Visits Date Type Provider Dept  03/01/19 Procedure visit Milinda Pointer, MD [facility]  02/21/19 Office Visit Milinda Pointer, MD [facility]  Showing recent visits within past 90 days and meeting all other requirements   Today's Visits Date Type Provider Dept  03/17/19 Office Visit Milinda Pointer, MD [facility]  Showing today's visits and meeting all other requirements   Future Appointments Date Type Provider Dept  05/18/19 Appointment Milinda Pointer, MD [facility]  Showing future appointments within next 90 days and meeting all other requirements   I discussed the assessment and treatment plan with the Anita Gibbs. The Anita Gibbs was provided an opportunity to ask questions and all were answered. The Anita Gibbs agreed with the plan and demonstrated an understanding of the instructions.  Anita Gibbs advised to call back or seek an in-person evaluation if the symptoms or condition worsens.  Total duration of non-face-to-face encounter: 18 minutes.  Note by: Gaspar Cola, MD Date: 03/17/2019; Time: 11:05 AM  Note: This dictation was prepared with Dragon dictation. Any transcriptional errors that may result from this process are unintentional.  Disclaimer:  * Given the special circumstances of the COVID-19 pandemic, the federal government has announced that the Office for Civil Rights (OCR) will exercise its enforcement discretion and will not impose penalties on physicians using telehealth in the event of noncompliance with regulatory requirements under the [place] (HIPAA) in connection with the good faith provision of telehealth during the HUDJS-97  national public health emergency. ([place])

## 2019-03-17 ENCOUNTER — Other Ambulatory Visit: Payer: Self-pay

## 2019-03-17 ENCOUNTER — Ambulatory Visit: Payer: BC Managed Care – PPO | Attending: Pain Medicine | Admitting: Pain Medicine

## 2019-03-17 DIAGNOSIS — M79602 Pain in left arm: Secondary | ICD-10-CM

## 2019-03-17 DIAGNOSIS — M79601 Pain in right arm: Secondary | ICD-10-CM | POA: Diagnosis not present

## 2019-03-17 DIAGNOSIS — M545 Low back pain, unspecified: Secondary | ICD-10-CM

## 2019-03-17 DIAGNOSIS — M47812 Spondylosis without myelopathy or radiculopathy, cervical region: Secondary | ICD-10-CM

## 2019-03-17 DIAGNOSIS — M542 Cervicalgia: Secondary | ICD-10-CM

## 2019-03-17 DIAGNOSIS — Z981 Arthrodesis status: Secondary | ICD-10-CM | POA: Diagnosis not present

## 2019-03-17 DIAGNOSIS — G8929 Other chronic pain: Secondary | ICD-10-CM

## 2019-03-22 ENCOUNTER — Ambulatory Visit
Admission: RE | Admit: 2019-03-22 | Discharge: 2019-03-22 | Disposition: A | Discharge status: Home / Self Care | Payer: BC Managed Care – PPO | Source: Ambulatory Visit | Attending: Pain Medicine | Admitting: Pain Medicine

## 2019-03-22 ENCOUNTER — Encounter: Payer: Self-pay | Admitting: Pain Medicine

## 2019-03-22 ENCOUNTER — Ambulatory Visit (HOSPITAL_BASED_OUTPATIENT_CLINIC_OR_DEPARTMENT_OTHER): Payer: BC Managed Care – PPO | Admitting: Pain Medicine

## 2019-03-22 ENCOUNTER — Other Ambulatory Visit: Payer: Self-pay

## 2019-03-22 VITALS — BP 112/95 | HR 59 | Temp 97.6°F | Resp 16 | Ht 63.0 in | Wt 228.0 lb

## 2019-03-22 DIAGNOSIS — M5030 Other cervical disc degeneration, unspecified cervical region: Secondary | ICD-10-CM

## 2019-03-22 DIAGNOSIS — M47812 Spondylosis without myelopathy or radiculopathy, cervical region: Secondary | ICD-10-CM

## 2019-03-22 DIAGNOSIS — M542 Cervicalgia: Secondary | ICD-10-CM | POA: Insufficient documentation

## 2019-03-22 HISTORY — DX: Spondylosis without myelopathy or radiculopathy, cervical region: M47.812

## 2019-03-22 MED ORDER — FENTANYL CITRATE (PF) 100 MCG/2ML IJ SOLN
25.0000 ug | INTRAMUSCULAR | Status: DC | PRN
  Administered 2019-03-22: 50 ug via INTRAVENOUS

## 2019-03-22 MED ORDER — ROPIVACAINE HCL 2 MG/ML IJ SOLN
INTRAMUSCULAR | Status: AC
  Filled 2019-03-22: qty 20

## 2019-03-22 MED ORDER — DEXAMETHASONE SODIUM PHOSPHATE 10 MG/ML IJ SOLN
20.0000 mg | Freq: Once | INTRAMUSCULAR | Status: AC
  Administered 2019-03-22: 20 mg

## 2019-03-22 MED ORDER — LIDOCAINE HCL 2 % IJ SOLN
INTRAMUSCULAR | Status: AC
  Filled 2019-03-22: qty 20

## 2019-03-22 MED ORDER — LACTATED RINGERS IV SOLN
1000.0000 mL | Freq: Once | INTRAVENOUS | Status: AC
  Administered 2019-03-22: 14:00:00 1000 mL via INTRAVENOUS

## 2019-03-22 MED ORDER — LIDOCAINE HCL 2 % IJ SOLN
20.0000 mL | Freq: Once | INTRAMUSCULAR | Status: AC
  Administered 2019-03-22: 400 mg

## 2019-03-22 MED ORDER — ROPIVACAINE HCL 2 MG/ML IJ SOLN
18.0000 mL | Freq: Once | INTRAMUSCULAR | Status: AC
  Administered 2019-03-22: 18 mL via PERINEURAL

## 2019-03-22 MED ORDER — FENTANYL CITRATE (PF) 100 MCG/2ML IJ SOLN
INTRAMUSCULAR | Status: AC
  Filled 2019-03-22: qty 2

## 2019-03-22 MED ORDER — MIDAZOLAM HCL 5 MG/5ML IJ SOLN
INTRAMUSCULAR | Status: AC
  Filled 2019-03-22: qty 5

## 2019-03-22 MED ORDER — MIDAZOLAM HCL 5 MG/5ML IJ SOLN
1.0000 mg | INTRAMUSCULAR | Status: DC | PRN
  Administered 2019-03-22: 2 mg via INTRAVENOUS

## 2019-03-22 MED ORDER — DEXAMETHASONE SODIUM PHOSPHATE 10 MG/ML IJ SOLN
INTRAMUSCULAR | Status: AC
  Filled 2019-03-22: qty 2

## 2019-03-22 NOTE — Progress Notes (Signed)
Patient's Name: Anita Gibbs  MRN: [id number]  Referring Provider: Jodelle Green, FNP  DOB: 1971-03-19  PCP: Anita Green, FNP  DOS: 03/22/2019  Note by: Anita Cola, MD  Service setting: Ambulatory outpatient  Specialty: Interventional Pain Management  Patient type: Established  Location: [facility] (AMB) Pain Management Facility  Visit type: Interventional Procedure   Primary Reason for Visit: Interventional Pain Management Treatment. CC: Neck Pain  Procedure:          Anesthesia, Analgesia, Anxiolysis:  Type: Cervical Facet Medial Branch Block(s)  #1  Primary Purpose: Diagnostic Region: Posterolateral cervical spine Level: C3, C4, C5, C6, & C7 Medial Branch Level(s). Injecting these levels blocks the C3-4, C4-5, C5-6, and C6-7 cervical facet joints. Laterality: Bilateral  Type: Moderate (Conscious) Sedation combined with Local Anesthesia Indication(s): Analgesia and Anxiety Route: Intravenous (IV) IV Access: Secured Sedation: Meaningful verbal contact was maintained at all times during the procedure  Local Anesthetic: Lidocaine 1-2%  Position: Prone with head of the table raised to facilitate breathing.   Indications: 1. Cervical facet joint syndrome (Bilateral) (L>R)   2. Spondylosis without myelopathy or radiculopathy, cervical region   3. Cervical facet hypertrophy (C3-T1)   4. Cervicalgia (Primary Area of Pain) (Bilateral) (L>R)   5. Cervical spondylosis   6. DDD (degenerative disc disease), cervical    Pain Score: Pre-procedure: 5 /10 Post-procedure: 3 /10   Pertinent Labs  COVID-19 screennig: Lab Results  Component Value Date   SARSCOV2NAA NOT DETECTED 02/25/2019   Pre-op Assessment:  [name] is a 48 y.o. (year old), female patient, seen today for interventional treatment. She  has a past surgical history that includes Knee arthroscopy (Right) and Anterior cervical decomp/discectomy fusion (N/A, 07/01/2017). [name] has a current medication list which  includes the following prescription(s): albuterol, amlodipine, cetirizine hcl, vitamin d3, diphenhydramine, epinephrine, famotidine, fluticasone, losartan, magnesium, methocarbamol, metronidazole, montelukast, oxycodone, oxycodone, and oxycodone, and the following Facility-Administered Medications: fentanyl and midazolam. Her primarily concern today is the Neck Pain  Initial Vital Signs:  Pulse/HCG Rate: 64ECG Heart Rate: (!) 59 Temp: 98.4 F (36.9 C) Resp: 16 BP: (!) 145/81 SpO2: 100 %  BMI: Estimated body mass index is 40.39 kg/m as calculated from the following:   Height as of this encounter: 5\' 3"  (1.6 m).   Weight as of this encounter: 228 lb (103.4 kg).  Risk Assessment: Allergies: Reviewed. She is allergic to other; shellfish allergy; strawberry (diagnostic); and tomato.  Allergy Precautions: None required Coagulopathies: Reviewed. None identified.  Blood-thinner therapy: None at this time Active Infection(s): Reviewed. None identified. [name] is afebrile  Site Confirmation: [name] was asked to confirm the procedure and laterality before marking the site Procedure checklist: Completed Consent: Before the procedure and under the influence of no sedative(s), amnesic(s), or anxiolytics, the patient was informed of the treatment options, risks and possible complications. To fulfill our ethical and legal obligations, as recommended by the American Medical Association's Code of Ethics, I have informed the patient of my clinical impression; the nature and purpose of the treatment or procedure; the risks, benefits, and possible complications of the intervention; the alternatives, including doing nothing; the risk(s) and benefit(s) of the alternative treatment(s) or procedure(s); and the risk(s) and benefit(s) of doing nothing. The patient was provided information about the general risks and possible complications associated with the procedure. These may include, but are not limited  to: failure to achieve desired goals, infection, bleeding, organ or nerve damage, allergic reactions, paralysis, and death.  In addition, the patient was informed of those risks and complications associated to Spine-related procedures, such as failure to decrease pain; infection (i.e.: Meningitis, epidural or intraspinal abscess); bleeding (i.e.: epidural hematoma, subarachnoid hemorrhage, or any other type of intraspinal or peri-dural bleeding); organ or nerve damage (i.e.: Any type of peripheral nerve, nerve root, or spinal cord injury) with subsequent damage to sensory, motor, and/or autonomic systems, resulting in permanent pain, numbness, and/or weakness of one or several areas of the body; allergic reactions; (i.e.: anaphylactic reaction); and/or death. Furthermore, the patient was informed of those risks and complications associated with the medications. These include, but are not limited to: allergic reactions (i.e.: anaphylactic or anaphylactoid reaction(s)); adrenal axis suppression; blood sugar elevation that in diabetics may result in ketoacidosis or comma; water retention that in patients with history of congestive heart failure may result in shortness of breath, pulmonary edema, and decompensation with resultant heart failure; weight gain; swelling or edema; medication-induced neural toxicity; particulate matter embolism and blood vessel occlusion with resultant organ, and/or nervous system infarction; and/or aseptic necrosis of one or more joints. Finally, the patient was informed that Medicine is not an exact science; therefore, there is also the possibility of unforeseen or unpredictable risks and/or possible complications that may result in a catastrophic outcome. The patient indicated having understood very clearly. We have given the patient no guarantees and we have made no promises. Enough time was given to the patient to ask questions, all of which were answered to the patient's satisfaction.  [name] has indicated that she wanted to continue with the procedure. Attestation: I, the ordering provider, attest that I have discussed with the patient the benefits, risks, side-effects, alternatives, likelihood of achieving goals, and potential problems during recovery for the procedure that I have provided informed consent. Date   Time: 03/22/2019  1:16 PM  Pre-Procedure Preparation:  Monitoring: As per clinic protocol. Respiration, ETCO2, SpO2, BP, heart rate and rhythm monitor placed and checked for adequate function Safety Precautions: Patient was assessed for positional comfort and pressure points before starting the procedure. Time-out: I initiated and conducted the "Time-out" before starting the procedure, as per protocol. The patient was asked to participate by confirming the accuracy of the "Time Out" information. Verification of the correct person, site, and procedure were performed and confirmed by me, the nursing staff, and the patient. "Time-out" conducted as per Joint Commission's Universal Protocol (UP.01.01.01). Time: 1356  Description of Procedure:          Laterality: Bilateral. The procedure was performed in identical fashion on both sides. Level: C3, C4, C5, C6, & C7 Medial Branch Level(s). Area Prepped: Posterior Cervico-thoracic Region Prepping solution: DuraPrep (Iodine Povacrylex [0.7% available iodine] and Isopropyl Alcohol, 74% w/w) Safety Precautions: Aspiration looking for blood return was conducted prior to all injections. At no point did we inject any substances, as a needle was being advanced. Before injecting, the patient was told to immediately notify me if she was experiencing any new onset of "ringing in the ears, or metallic taste in the mouth". No attempts were made at seeking any paresthesias. Safe injection practices and needle disposal techniques used. Medications properly checked for expiration dates. SDV (single dose vial) medications used. After the  completion of the procedure, all disposable equipment used was discarded in the proper designated medical waste containers. Local Anesthesia: Protocol guidelines were followed. The patient was positioned over the fluoroscopy table. The area was prepped in the usual manner. The time-out was completed. The target  area was identified using fluoroscopy. A 12-in long, straight, sterile hemostat was used with fluoroscopic guidance to locate the targets for each level blocked. Once located, the skin was marked with an approved surgical skin marker. Once all sites were marked, the skin (epidermis, dermis, and hypodermis), as well as deeper tissues (fat, connective tissue and muscle) were infiltrated with a small amount of a short-acting local anesthetic, loaded on a 10cc syringe with a 25G, 1.5-in  Needle. An appropriate amount of time was allowed for local anesthetics to take effect before proceeding to the next step. Local Anesthetic: Lidocaine 2.0% The unused portion of the local anesthetic was discarded in the proper designated containers. Technical explanation of process:  C3 Medial Branch Nerve Block (MBB): The target area for the C3 dorsal medial articular branch is the lateral concave waist of the articular pillar of C3. Under fluoroscopic guidance, a Quincke needle was inserted until contact was made with os over the postero-lateral aspect of the articular pillar of C3 (target area). After negative aspiration for blood, 0.5 mL of the nerve block solution was injected without difficulty or complication. The needle was removed intact. C4 Medial Branch Nerve Block (MBB): The target area for the C4 dorsal medial articular branch is the lateral concave waist of the articular pillar of C4. Under fluoroscopic guidance, a Quincke needle was inserted until contact was made with os over the postero-lateral aspect of the articular pillar of C4 (target area). After negative aspiration for blood, 0.5 mL of the nerve block  solution was injected without difficulty or complication. The needle was removed intact. C5 Medial Branch Nerve Block (MBB): The target area for the C5 dorsal medial articular branch is the lateral concave waist of the articular pillar of C5. Under fluoroscopic guidance, a Quincke needle was inserted until contact was made with os over the postero-lateral aspect of the articular pillar of C5 (target area). After negative aspiration for blood, 0.5 mL of the nerve block solution was injected without difficulty or complication. The needle was removed intact. C6 Medial Branch Nerve Block (MBB): The target area for the C6 dorsal medial articular branch is the lateral concave waist of the articular pillar of C6. Under fluoroscopic guidance, a Quincke needle was inserted until contact was made with os over the postero-lateral aspect of the articular pillar of C6 (target area). After negative aspiration for blood, 0.5 mL of the nerve block solution was injected without difficulty or complication. The needle was removed intact. C7 Medial Branch Nerve Block (MBB): The target for the C7 dorsal medial articular branch lies on the superior-medial tip of the C7 transverse process. Under fluoroscopic guidance, a Quincke needle was inserted until contact was made with os over the postero-lateral aspect of the articular pillar of C7 (target area). After negative aspiration for blood, 0.5 mL of the nerve block solution was injected without difficulty or complication. The needle was removed intact. Procedural Needles: 22-gauge, 3.5-inch, Quincke needles used for all levels. Nerve block solution: 0.2% PF-Ropivacaine + Triamcinolone (40 mg/mL) diluted to a final concentration of 4 mg of Triamcinolone/mL of Ropivacaine The unused portion of the solution was discarded in the proper designated containers.  Once the entire procedure was completed, the treated area was cleaned, making sure to leave some of the prepping solution back  to take advantage of its long term bactericidal properties.  Anatomy Reference Guide:       Vitals:   03/22/19 1404 03/22/19 1409 03/22/19 1419 03/22/19 1429  BP: 129/65  121/65 139/74 128/68  Pulse: (!) 57 (!) 59 (!) 59   Resp: 20 19 18    Temp:    97.6 F (36.4 C)  TempSrc:      SpO2: 100% 100% 100% 100%  Weight:      Height:        Start Time: 1356 hrs. End Time: 1408 hrs.  Imaging Guidance (Spinal):          Type of Imaging Technique: Fluoroscopy Guidance (Spinal) Indication(s): Assistance in needle guidance and placement for procedures requiring needle placement in or near specific anatomical locations not easily accessible without such assistance. Exposure Time: Please see nurses notes. Contrast: None used. Fluoroscopic Guidance: I was personally present during the use of fluoroscopy. "Tunnel Vision Technique" used to obtain the best possible view of the target area. Parallax error corrected before commencing the procedure. "Direction-depth-direction" technique used to introduce the needle under continuous pulsed fluoroscopy. Once target was reached, antero-posterior, oblique, and lateral fluoroscopic projection used confirm needle placement in all planes. Images permanently stored in EMR. Interpretation: No contrast injected. I personally interpreted the imaging intraoperatively. Adequate needle placement confirmed in multiple planes. Permanent images saved into the patient's record.  Antibiotic Prophylaxis:   Anti-infectives (From admission, onward)   None     Indication(s): None identified  Post-operative Assessment:  Post-procedure Vital Signs:  Pulse/HCG Rate: (!) 5960 Temp: 97.6 F (36.4 C) Resp: 18 BP: 128/68 SpO2: 100 %  EBL: None  Complications: No immediate post-treatment complications observed by team, or reported by patient.  Note: The patient tolerated the entire procedure well. A repeat set of vitals were taken after the procedure and the patient  was kept under observation following institutional policy, for this type of procedure. Post-procedural neurological assessment was performed, showing return to baseline, prior to discharge. The patient was provided with post-procedure discharge instructions, including a section on how to identify potential problems. Should any problems arise concerning this procedure, the patient was given instructions to immediately contact us, at any time, without hesitation. In any case, we plan to contact the patient by telephone for a follow-up status report regarding this interventional procedure.  Comments:  No additional relevant information.  Plan of Care  Orders:  Orders Placed This Encounter  Procedures   CERVICAL FACET (MEDIAL BRANCH NERVE BLOCK)     Scheduling Instructions:     Side: Bilateral     Level: C3-4, C4-5, C5-6 Facet joints (C3, C4, C5, C6, & C7 Medial Branch Nerves)     Sedation: With Sedation.     Timeframe: Today    Order Specific Question:   Where will this procedure be performed?    Answer:   [facility]   DG [facility] C-ARM 1-60 MIN NO REPORT    Intraoperative interpretation by procedural physician at [facility].    Standing Status:   Standing    Number of Occurrences:   1    Order Specific Question:   Reason for exam:    Answer:   Assistance in needle guidance and placement for procedures requiring needle placement in or near specific anatomical locations not easily accessible without such assistance.   Informed Consent Details: Transcribe to consent form and obtain patient signature    Surgeon: Nettie Wyffels A. Dossie Arbour, MD    Scheduling Instructions:     Procedure: Bilateral Cervical facet block under fluoroscopic guidance.     Indications: Chronic neck pain secondary to cervical facet syndrome   Provider attestation of informed consent  for procedure/surgical case    I, the ordering provider, attest that I have discussed with the patient the benefits,  risks, side effects, alternatives, likelihood of achieving goals and potential problems during recovery for the procedure that I have provided informed consent.    Standing Status:   Standing    Number of Occurrences:   1   Chronic Opioid Analgesic:  Oxycodone IR 5 mg, 1 tab PO QD (has enough to last until 05/28/2019) MME/day:7.5mg /day.   Medications ordered for procedure: Meds ordered this encounter  Medications   lidocaine (XYLOCAINE) 2 % (with pres) injection 400 mg   lactated ringers infusion 1,000 mL   midazolam (VERSED) 5 MG/5ML injection 1-2 mg    Make sure Flumazenil is available in the pyxis when using this medication. If oversedation occurs, administer 0.2 mg IV over 15 sec. If after 45 sec no response, administer 0.2 mg again over 1 min; may repeat at 1 min intervals; not to exceed 4 doses (1 mg)   fentaNYL (SUBLIMAZE) injection 25-50 mcg    Make sure Narcan is available in the pyxis when using this medication. In the event of respiratory depression (RR< 8/min): Titrate NARCAN (naloxone) in increments of 0.1 to 0.2 mg IV at 2-3 minute intervals, until desired degree of reversal.   ropivacaine (PF) 2 mg/mL (0.2%) (NAROPIN) injection 18 mL   dexamethasone (DECADRON) injection 20 mg   Medications administered: We administered lidocaine, lactated ringers, midazolam, fentaNYL, ropivacaine (PF) 2 mg/mL (0.2%), and dexamethasone.  See the medical record for exact dosing, route, and time of administration.  Follow-up plan:   Return for (VV), 2 wk PP-F/U Eval.       Interventional management options:  Considering:   Diagnostic bilateral cervicalfacet nerve block #1under fluoroscopic guidance and IV sedation (today) Possible bilateral cervical facetRFA Diagnostic right L3-4 lumbarESI Diagnostic bilateral L3 vs L4 TFESI  Diagnostic bilateral lumbar facet nerve block Possible bilateral lumbar facetRFA Diagnostic right vs bilateral S-I joint injection Possible right  vs bilateral S-I jointRFA   Palliative PRN treatment(s):   Palliative right-sided CESI #4     Recent Visits Date Type Provider Dept  03/17/19 Office Visit Milinda Pointer, MD [facility]  03/01/19 Procedure visit Milinda Pointer, MD [facility]  02/21/19 Office Visit Milinda Pointer, MD [facility]  Showing recent visits within past 90 days and meeting all other requirements   Today's Visits Date Type Provider Dept  03/22/19 Procedure visit Milinda Pointer, MD [facility]  Showing today's visits and meeting all other requirements   Future Appointments Date Type Provider Dept  04/20/19 Appointment Milinda Pointer, MD [facility]  05/18/19 Appointment Milinda Pointer, MD [facility]  Showing future appointments within next 90 days and meeting all other requirements   Disposition: Discharge home  Discharge Date & Time: 03/22/2019; 1440 hrs.   Primary Care Physician: Anita Green, FNP Location: [facility] Note by: Anita Cola, MD Date: 03/22/2019; Time: 2:35 PM  Disclaimer:  Medicine is not an Chief Strategy Officer. The only guarantee in medicine is that nothing is guaranteed. It is important to note that the decision to proceed with this intervention was based on the information collected from the patient. The Data and conclusions were drawn from the patient's questionnaire, the interview, and the physical examination. Because the information was provided in large part by the patient, it cannot be guaranteed that it has not been purposely or unconsciously manipulated. Every effort has been  made to obtain as much relevant data as possible for this evaluation. It is important to note that the conclusions that lead to this procedure are derived in large part from the available data. Always take into account that the treatment will also be dependent on availability of resources and  existing treatment guidelines, considered by other Pain Management Practitioners as being common knowledge and practice, at the time of the intervention. For Medico-Legal purposes, it is also important to point out that variation in procedural techniques and pharmacological choices are the acceptable norm. The indications, contraindications, technique, and results of the above procedure should only be interpreted and judged by a Board-Certified Interventional Pain Specialist with extensive familiarity and expertise in the same exact procedure and technique.

## 2019-03-22 NOTE — Patient Instructions (Signed)

## 2019-03-23 ENCOUNTER — Telehealth: Payer: Self-pay | Admitting: *Deleted

## 2019-03-23 NOTE — Telephone Encounter (Signed)
No answer. LVM for her to call for any post procedure issues.

## 2019-04-19 ENCOUNTER — Ambulatory Visit (INDEPENDENT_AMBULATORY_CARE_PROVIDER_SITE_OTHER): Payer: BC Managed Care – PPO | Admitting: Family Medicine

## 2019-04-19 ENCOUNTER — Encounter: Payer: Self-pay | Admitting: Pain Medicine

## 2019-04-19 ENCOUNTER — Other Ambulatory Visit: Payer: Self-pay

## 2019-04-19 ENCOUNTER — Ambulatory Visit: Payer: Self-pay | Admitting: *Deleted

## 2019-04-19 DIAGNOSIS — R21 Rash and other nonspecific skin eruption: Secondary | ICD-10-CM

## 2019-04-19 DIAGNOSIS — L299 Pruritus, unspecified: Secondary | ICD-10-CM

## 2019-04-19 MED ORDER — TRIAMCINOLONE ACETONIDE 0.1 % EX CREA: 1.0000 "application " | TOPICAL_CREAM | Freq: Two times a day (BID) | CUTANEOUS | 0 refills | Status: DC

## 2019-04-19 MED ORDER — PREDNISONE 10 MG (21) PO TBPK: ORAL_TABLET | ORAL | 0 refills | Status: DC

## 2019-04-19 MED ORDER — HYDROXYZINE HCL 10 MG PO TABS: 10.0000 mg | ORAL_TABLET | Freq: Three times a day (TID) | ORAL | 1 refills | Status: DC | PRN

## 2019-04-19 NOTE — Telephone Encounter (Signed)
Patient thought she had had mosquito bite- but legs are swollen and itching. Patient has localized swelling inner thigh bilaterally. Patient is not sure what she has going on there.  Reason for Disposition . [1] Red or very tender (to touch) area AND [2] started over 24 hours after the bite  Answer Assessment - Initial Assessment Questions 1. ONSET: "When did the swelling start?" (e.g., minutes, hours, days)     last night about 8 oclock 2. LOCATION: "What part of the leg is swollen?"  "Are both legs swollen or just one leg?"     Upper legs- inner thigh area 3. SEVERITY: "How bad is the swelling?" (e.g., localized; mild, moderate, severe)  - Localized - small area of swelling localized to one leg  - MILD pedal edema - swelling limited to foot and ankle, pitting edema < 1/4 inch (6 mm) deep, rest and elevation eliminate most or all swelling  - MODERATE edema - swelling of lower leg to knee, pitting edema > 1/4 inch (6 mm) deep, rest and elevation only partially reduce swelling  - SEVERE edema - swelling extends above knee, facial or hand swelling present      Localized to upper legs 4. REDNESS: "Does the swelling look red or infected?"     No sure- patient has calamine lotion on it 5. PAIN: "Is the swelling painful to touch?" If so, ask: "How painful is it?"   (Scale 1-10; mild, moderate or severe)     Yes- 7 6. FEVER: "Do you have a fever?" If so, ask: "What is it, how was it measured, and when did it start?"      no 7. CAUSE: "What do you think is causing the leg swelling?"     Patient thought it was mosquito bites 8. MEDICAL HISTORY: "Do you have a history of heart failure, kidney disease, liver failure, or cancer?"     no 9. RECURRENT SYMPTOM: "Have you had leg swelling before?" If so, ask: "When was the last time?" "What happened that time?"     no 10. OTHER SYMPTOMS: "Do you have any other symptoms?" (e.g., chest pain, difficulty breathing)       no 11. PREGNANCY: "Is there any  chance you are pregnant?" "When was your last menstrual period?"       No- patient is on pills to control bleeding  Protocols used: INSECT BITE-A-AH, LEG SWELLING AND EDEMA-A-AH

## 2019-04-19 NOTE — Progress Notes (Signed)
Pain Management Virtual Encounter Note - Virtual Visit via Telephone Telehealth (real-time audio visits between healthcare provider and patient).   Patient's Phone No. & Preferred Pharmacy:  [phone] (home); [phone] (mobile); (Preferred) [phone] [email or web address]  [place], [place] - [address] [place] [place] [address] Phone: [phone] Fax: [phone]    Pre-screening note:  Our staff contacted Ms. Chaidez and offered her an "in person", "face-to-face" appointment versus a telephone encounter. She indicated preferring the telephone encounter, at this time.   Reason for Virtual Visit: COVID-19*  Social distancing based on CDC and AMA recommendations.   I contacted [name] on 04/20/2019 via telephone.      I clearly identified myself as Gaspar Cola, MD. I verified that I was speaking with the correct person using two identifiers (Name: SALIMATOU SIMONE, and date of birth: [birth year]).  Advanced Informed Consent I sought verbal advanced consent from [name] for virtual visit interactions. I informed Ms. Wiechmann of possible security and privacy concerns, risks, and limitations associated with providing "not-in-person" medical evaluation and management services. I also informed Ms. Looney of the availability of "in-person" appointments. Finally, I informed her that there would be a charge for the virtual visit and that she could be  personally, fully or partially, financially responsible for it. Ms. Polimeni expressed understanding and agreed to proceed.   Historic Elements   Ms. ASHANTE YELLIN is a 48 y.o. year old, female patient evaluated today after her last encounter by our practice on 03/23/2019. Ms. Erker  has a past medical history of Arthritis, Carpal boss, right, Carpal tunnel syndrome, bilateral, Headache, Hypertension, and Seasonal asthma (11/29/2018). She also  has a past surgical history that  includes Knee arthroscopy (Right) and Anterior cervical decomp/discectomy fusion (N/A, 07/01/2017). Ms. Villeda has a current medication list which includes the following prescription(s): albuterol, amlodipine, cetirizine hcl, diphenhydramine, epinephrine, famotidine, fluticasone, gabapentin, hydroxyzine, losartan, methocarbamol, montelukast, oxycodone, oxycodone, prednisone, triamcinolone cream, and oxycodone. She  reports that she has never smoked. She has never used smokeless tobacco. She reports current alcohol use. She reports that she does not use drugs. Ms. Gwynne is allergic to other; shellfish allergy; strawberry (diagnostic); and tomato.   HPI  Today, she is being contacted for both, medication management and a post-procedure assessment.  Today I went over the results of the procedure with the patient and it turns out that she had complete relief of her neck pain for at least 2 weeks.  What did not get better with her low back pain, but when she was provided notes with the results of the procedure she was a little confused and thought that she should include the lower back pain and the report of her pain results.  Anyway, I spent today quite a bit of time explaining to her the difference between the axial pain and the extremity pain and the origins of those as well as how the local anesthetic and steroids worked in terms of the diagnostic blocks that were performing.  Today we also spoke about the choices of treating this with radiofrequency as well as all the choices such as surgeries.  She currently has most of her pain in the lower back and she wants [place] to work on that first.  However, she is also having pain in the upper back and numbness in both arms.  This numbness and pain of the upper extremities did get better with a series of 3 cervical  epidural steroid injections, but unfortunately it is coming back.  We will need to address this in the near future.  She currently describes her low back pain  as being bilateral.  She is also denying any type of referred pain or lower extremity pain.  Note: The patient's last prescription filled was on 02/21/2019 and it was a prescription written on 11/29/2018.  Looking at her medication use pattern, it would seem that 30 pills are lasting her approximately 70 days.  The last time that I refilled her medication I gave her 3 prescriptions on 02/21/2019, for 30 pills each.  If each of the prescriptions is lasting her 70 days, this means that her last prescription filled on 02/21/2019 should last her until 05/02/2019.  If at that point she starts using the 3 prescriptions that I wrote for her on 02/21/2019, this means that her prescriptions to last until 11/28/2019.    Post-Procedure Evaluation  Procedure: Diagnostic bilateral cervical facet block #1 under fluoroscopic guidance and IV sedation Pre-procedure pain level:  5/10 Post-procedure: 3/10 (< 50% relief)  Sedation: Sedation provided.  Effectiveness during initial hour after procedure(Ultra-Short Term Relief): 100 %   Local anesthetic used: Long-acting (4-6 hours) Effectiveness: Defined as any analgesic benefit obtained secondary to the administration of local anesthetics. This carries significant diagnostic value as to the etiological location, or anatomical origin, of the pain. Duration of benefit is expected to coincide with the duration of the local anesthetic used.  Effectiveness during initial 4-6 hours after procedure(Short-Term Relief): 100 %   Long-term benefit: Defined as any relief past the pharmacologic duration of the local anesthetics.  Effectiveness past the initial 6 hours after procedure(Long-Term Relief): 100 % relief of the neck pain for 2 weeks.  Current benefits: Defined as benefit that persist at this time.   Analgesia:  >50% relief of the neck pain, but she still having numbness of the upper extremities which has been getting worse.  Aside from this, she also has quite a bit of pain in  her lower back. Function: Somewhat improved ROM: Somewhat improved  Pharmacotherapy Assessment  Analgesic: Oxycodone IR 5 mg, 1 tab PO QD (5 mg/day of oxycodone) (has enough to last until 11/28/2019) (please see the 04/20/2019 note.) MME/day:7.5mg /day.   Monitoring: Pharmacotherapy: No side-effects or adverse reactions reported. [place] PMP: PDMP reviewed during this encounter.       Compliance: No problems identified. Effectiveness: Clinically acceptable. Plan: Refer to "POC".  UDS:  Summary  Date Value Ref Range Status  09/07/2018 FINAL  Final    Comment:    ==================================================================== TOXASSURE COMP DRUG ANALYSIS,UR ==================================================================== Test                             Result       Flag       Units Drug Present and Declared for Prescription Verification   Oxycodone                      220          EXPECTED   ng/mg creat   Oxymorphone                    65           EXPECTED   ng/mg creat   Noroxycodone                   363  EXPECTED   ng/mg creat    Sources of oxycodone include scheduled prescription medications.    Oxymorphone and noroxycodone are expected metabolites of    oxycodone. Oxymorphone is also available as a scheduled    prescription medication.   Gabapentin                     PRESENT      EXPECTED   Methocarbamol                  PRESENT      EXPECTED   Diphenhydramine                PRESENT      EXPECTED Drug Absent but Declared for Prescription Verification   Acetaminophen                  Not Detected UNEXPECTED    Acetaminophen, as indicated in the declared medication list, is    not always detected even when used as directed.   Salicylate                     Not Detected UNEXPECTED    Aspirin, as indicated in the declared medication list, is not    always detected even when used as directed.   Naproxen                       Not Detected  UNEXPECTED ==================================================================== Test                      Result    Flag   Units      Ref Range   Creatinine              207              mg/dL      >=20 ==================================================================== Declared Medications:  The flagging and interpretation on this report are based on the  following declared medications.  Unexpected results may arise from  inaccuracies in the declared medications.  **Note: The testing scope of this panel includes these medications:  Diphenhydramine (Benadryl)  Gabapentin  Methocarbamol (Robaxin)  Naproxen (Naprosyn)  Oxycodone  **Note: The testing scope of this panel does not include small to  moderate amounts of these reported medications:  Acetaminophen (Excedrin)  Aspirin (Excedrin)  **Note: The testing scope of this panel does not include following  reported medications:  Albuterol  Caffeine (Excedrin)  Epinephrine (EpiPen)  Famotidine (Pepcid)  Losartan (Cozaar)  Metronidazole (Flagyl) ==================================================================== For clinical consultation, please call [phone]. ====================================================================    Laboratory Chemistry Profile (12 mo)  Renal: 09/07/2018: BUN/Creatinine Ratio 13 09/23/2018: BUN 11; Creatinine, Ser 0.89  Lab Results  Component Value Date   GFRAA 76 09/07/2018   GFRNONAA 66 09/07/2018   Hepatic: 09/07/2018: Albumin 4.3 Lab Results  Component Value Date   AST 10 09/07/2018   ALT 10 08/03/2018   Other: 09/07/2018: 25-Hydroxy, Vitamin D 8.1; 25-Hydroxy, Vitamin D-2 <1.0; 25-Hydroxy, Vitamin D-3 8.1; CRP 7; Sed Rate 67; Vitamin B-12 601 Note: Above Lab results reviewed.  Imaging  Last 90 days:  Dg Pain Clinic C-arm 1-60 Min No Report  Result Date: 03/22/2019 Fluoro was used, but no Radiologist interpretation will be provided. Please refer to "NOTES" tab for provider progress  note.  Dg Pain Clinic C-arm 1-60 Min No Report  Result Date: 03/01/2019 Fluoro was used, but no Radiologist interpretation will be  provided. Please refer to "NOTES" tab for provider progress note.   Assessment  The primary encounter diagnosis was Chronic pain syndrome. 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  Plan of Care  I have discontinued Chaise C. Soy's metroNIDAZOLE. I am also having her maintain her diphenhydrAMINE, famotidine, EPINEPHrine, losartan, fluticasone, albuterol, amLODipine, montelukast, Cetirizine HCl (ZYRTEC PO), oxyCODONE, methocarbamol, oxyCODONE, oxyCODONE, predniSONE, hydrOXYzine, triamcinolone cream, and gabapentin.  Pharmacotherapy (Medications Ordered): No orders of the defined types were placed in this encounter.  Orders:  Orders Placed This Encounter  Procedures  . LUMBAR FACET(MEDIAL BRANCH NERVE BLOCK) MBNB    Standing Status:   Future    Standing Expiration Date:   05/21/2019    Scheduling Instructions:     Side: Bilateral     Level: L3-4, L4-5, & L5-S1 Facets (L2, L3, L4, L5, & S1 Medial Branch Nerves)     Sedation: Patient's choice.     Timeframe: ASAA    Order Specific Question:   Where will this procedure be performed?    Answer:   [facility]  . SACROILIAC JOINT INJECTION    Standing Status:   Future    Standing Expiration Date:   05/21/2019    Scheduling Instructions:     Side: Bilateral     Sedation: Patient's choice.      Timeframe: ASAP    Order Specific Question:   Where will this procedure be performed?    Answer:   [facility]   Follow-up plan:   Return in about 7 months (around 11/28/2019) for (VV), E/M (MM), in addition, Procedure (w/ sedation): (B) L-FCT #1 Blk Vs SI Blk #1.      Interventional management options:  Considering:   Possible bilateral cervical facetRFA Diagnostic right L3-4 LESI Diagnostic bilateral L3 vs L4 TFESI  Diagnostic bilateral lumbar facet block Possible bilateral lumbar facetRFA Diagnostic right vs bilateral S-I joint injection Possible right vs bilateral S-I jointRFA   Palliative PRN treatment(s):   Palliative right-sided CESI #4  Diagnostic bilateral cervicalfacet nerve block #2     Recent Visits Date Type Provider Dept  03/22/19 Procedure visit Milinda Pointer, MD [facility]  03/17/19 Office Visit Milinda Pointer, MD [facility]  03/01/19 Procedure visit Milinda Pointer, MD [facility]  02/21/19 Office Visit Milinda Pointer, MD [facility]  Showing recent visits within past 90 days and meeting all other requirements   Today's Visits Date Type Provider Dept  04/20/19 Office Visit Milinda Pointer, MD [facility]  Showing today's visits and meeting all other requirements   Future Appointments No visits were found meeting these conditions.  Showing future appointments within next 90 days and meeting all other requirements   I discussed the assessment and treatment plan with the patient. The patient was provided an opportunity to ask questions and all were answered. The patient agreed with the plan and demonstrated an understanding of the instructions.  Patient advised to call back or seek an in-person evaluation if the symptoms or condition worsens.  Total duration of non-face-to-face encounter: 25  minutes.  Note by: Gaspar Cola, MD Date: 04/20/2019; Time: 10:41  AM  Note: This dictation was prepared with Dragon dictation. Any transcriptional errors that may result from this process are unintentional.  Disclaimer:  * Given the special circumstances of the COVID-19 pandemic, the federal government has announced that the Office for Civil Rights (OCR) will exercise its enforcement discretion and will not impose penalties on physicians using telehealth in the event of noncompliance with regulatory requirements under the [place] (HIPAA) in connection with the good faith provision of telehealth during the GEXBM-84 national public health emergency. ([place])

## 2019-04-19 NOTE — Progress Notes (Signed)
Patient ID: Anita Gibbs, female   DOB: 02/05/1971, 48 y.o.   MRN: [id number]    Virtual Visit via video Note  This visit type was conducted due to national recommendations for restrictions regarding the COVID-19 pandemic (e.g. social distancing).  This format is felt to be most appropriate for this patient at this time.  All issues noted in this document were discussed and addressed.  No physical exam was performed (except for noted visual exam findings with Video Visits).   I connected with Garald Braver today at  3:20 PM EDT by a video enabled telemedicine application nd verified that I am speaking with the correct person using two identifiers. Location patient: home Location provider: work or home office Persons participating in the virtual visit: patient, provider  I discussed the limitations, risks, security and privacy concerns of performing an evaluation and management service by telephone and the availability of in person appointments. I also discussed with the patient that there may be a patient responsible charge related to this service. The patient expressed understanding and agreed to proceed.  HPI:  Patient and I connected via video due to rash on bilateral upper thighs.  States rash is a little bit red, raised and bumpy and would describe it as very itchy.  Denies any new lotions, soaps or detergents.  Denies new places or foods.  No open or draining areas in the rash.  Denies fever or chills.    ROS: See pertinent positives and negatives per HPI.  Past Medical History:  Diagnosis Date  . Arthritis   . Carpal boss, right   . Carpal tunnel syndrome, bilateral   . Headache   . Hypertension   . Seasonal asthma 11/29/2018    Past Surgical History:  Procedure Laterality Date  . ANTERIOR CERVICAL DECOMP/DISCECTOMY FUSION N/A 07/01/2017   Procedure: ANTERIOR CERVICAL DECOMPRESSION/DISCECTOMY FUSION 3 LEVELS;  Surgeon: Meade Maw, MD;  Location: [facility];  Service:  Neurosurgery;  Laterality: N/A;  . KNEE ARTHROSCOPY Right     Family History  Problem Relation Age of Onset  . [name] Mother   . Diabetes Mother   . Arthritis Mother   . Hypertension Mother   . Hyperlipidemia Mother   . Hypertension Father   . Arthritis Father   . Hearing loss Father   . Kidney disease Father   . Arthritis Sister   . Hypertension Sister   . Cancer Brother   . COPD Brother   . Early death Brother   . COPD Son   . Depression Son   . Breast cancer Maternal Aunt   . Breast cancer Paternal Aunt   . Allergic rhinitis Neg Hx   . Angioedema Neg Hx   . Eczema Neg Hx   . Urticaria Neg Hx   . Asthma Neg Hx    Social History   Tobacco Use  . Smoking status: Never Smoker  . Smokeless tobacco: Never Used  Substance Use Topics  . Alcohol use: Yes    Comment: occ    Current Outpatient Medications:  .  albuterol (PROVENTIL HFA;VENTOLIN HFA) 108 (90 Base) MCG/ACT inhaler, Inhale 2 puffs into the lungs every 6 (six) hours as needed for wheezing or shortness of breath., Disp: 1 Inhaler, Rfl: 5 .  amLODipine (NORVASC) 5 MG tablet, Take 1 tablet (5 mg total) by mouth daily., Disp: 90 tablet, Rfl: 1 .  Cetirizine HCl (ZYRTEC PO), Take 25 mg by mouth., Disp: , Rfl:  .  diphenhydrAMINE (BENADRYL) 25 mg  capsule, Take 25 mg by mouth every 6 (six) hours as needed (for allergies.)., Disp: , Rfl:  .  EPINEPHrine (AUVI-Q) 0.3 mg/0.3 mL IJ SOAJ injection, Use as directed for severe allergic reaction, Disp: 2 Device, Rfl: 2 .  famotidine (PEPCID) 20 MG tablet, Take 1 tablet (20 mg total) by mouth 2 (two) times daily., Disp: 60 tablet, Rfl: 2 .  fluticasone (FLONASE) 50 MCG/ACT nasal spray, Place 2 sprays into both nostrils daily., Disp: 16 g, Rfl: 6 .  losartan (COZAAR) 100 MG tablet, Take 1 tablet (100 mg total) by mouth daily., Disp: 90 tablet, Rfl: 3 .  methocarbamol (ROBAXIN) 750 MG tablet, Take 1 tablet (750 mg total) by mouth every 8 (eight) hours as needed for muscle  spasms., Disp: 90 tablet, Rfl: 2 .  metroNIDAZOLE (FLAGYL) 500 MG tablet, Take 1 tablet (500 mg total) by mouth 2 (two) times daily., Disp: 14 tablet, Rfl: 0 .  montelukast (SINGULAIR) 10 MG tablet, TAKE 1 TABLET BY MOUTH AT BEDTIME, Disp: 30 tablet, Rfl: 4 .  oxyCODONE (OXY IR/ROXICODONE) 5 MG immediate release tablet, Take 1 tablet (5 mg total) by mouth daily as needed for up to 30 days for severe pain. Must last 30 days, Disp: 30 tablet, Rfl: 0 .  [START ON 04/28/2019] oxyCODONE (OXY IR/ROXICODONE) 5 MG immediate release tablet, Take 1 tablet (5 mg total) by mouth daily as needed for up to 30 days for severe pain. Must last 30 days, Disp: 30 tablet, Rfl: 0 .  oxyCODONE (OXY IR/ROXICODONE) 5 MG immediate release tablet, Take 1 tablet (5 mg total) by mouth daily as needed for up to 30 days for severe pain. Must last 30 days, Disp: 30 tablet, Rfl: 0  EXAM:  GENERAL: alert, oriented, appears well and in no acute distress  HEENT: atraumatic, conjunttiva clear, no obvious abnormalities on inspection of external nose and ears  NECK: normal movements of the head and neck  LUNGS: on inspection no signs of respiratory distress, breathing rate appears normal, no obvious gross SOB, gasping or wheezing  CV: no obvious cyanosis  MS: moves all visible extremities without noticeable abnormality  PSYCH/NEURO: pleasant and cooperative, no obvious depression or anxiety, speech and thought processing grossly intact  ASSESSMENT AND PLAN:  Discussed the following assessment and plan:  Rash, itching - unclear reason or cause for rash.  We will treat with oral prednisone taper, topical steroid cream and hydroxyzine as needed for itching.  Patient also advised to take an oral nondrowsy antihistamine in the morning such as a Allegra 180 mg or Claritin 10 mg to calm down bodies histamine response.  Advised to not use topical steroid cream for longer than 7 days at a time as longer term use of steroid cream can  discolor skin.  Advised if symptoms of rash and itching do not improve the next 7 to 10 days to let us know and at that point we can consider dermatology referral.  To monitor for any possible triggers and advised to use soaps that are mild and unscented as these are less irritating to skin.   I discussed the assessment and treatment plan with the patient. The patient was provided an opportunity to ask questions and all were answered. The patient agreed with the plan and demonstrated an understanding of the instructions.   The patient was advised to call back or seek an in-person evaluation if the symptoms worsen or if the condition fails to improve as anticipated.  Jodelle Green, FNP

## 2019-04-20 ENCOUNTER — Ambulatory Visit: Payer: BC Managed Care – PPO | Attending: Pain Medicine | Admitting: Pain Medicine

## 2019-04-20 DIAGNOSIS — G894 Chronic pain syndrome: Secondary | ICD-10-CM

## 2019-04-20 DIAGNOSIS — M533 Sacrococcygeal disorders, not elsewhere classified: Secondary | ICD-10-CM

## 2019-04-20 DIAGNOSIS — M545 Low back pain, unspecified: Secondary | ICD-10-CM

## 2019-04-20 DIAGNOSIS — M47816 Spondylosis without myelopathy or radiculopathy, lumbar region: Secondary | ICD-10-CM

## 2019-04-20 DIAGNOSIS — M79605 Pain in left leg: Secondary | ICD-10-CM

## 2019-04-20 DIAGNOSIS — M79601 Pain in right arm: Secondary | ICD-10-CM

## 2019-04-20 DIAGNOSIS — M5441 Lumbago with sciatica, right side: Secondary | ICD-10-CM

## 2019-04-20 DIAGNOSIS — G8929 Other chronic pain: Secondary | ICD-10-CM

## 2019-04-20 DIAGNOSIS — M5442 Lumbago with sciatica, left side: Secondary | ICD-10-CM | POA: Diagnosis not present

## 2019-04-20 DIAGNOSIS — M542 Cervicalgia: Secondary | ICD-10-CM

## 2019-04-20 DIAGNOSIS — M79602 Pain in left arm: Secondary | ICD-10-CM

## 2019-04-20 DIAGNOSIS — M79604 Pain in right leg: Secondary | ICD-10-CM

## 2019-04-20 HISTORY — DX: Spondylosis without myelopathy or radiculopathy, lumbar region: M47.816

## 2019-04-20 NOTE — Patient Instructions (Signed)

## 2019-05-10 ENCOUNTER — Encounter: Payer: Self-pay | Admitting: Allergy and Immunology

## 2019-05-10 ENCOUNTER — Other Ambulatory Visit: Payer: Self-pay

## 2019-05-10 ENCOUNTER — Ambulatory Visit: Payer: BC Managed Care – PPO | Admitting: Allergy and Immunology

## 2019-05-10 VITALS — BP 160/98 | HR 78 | Temp 98.1°F | Resp 16

## 2019-05-10 DIAGNOSIS — Z9109 Other allergy status, other than to drugs and biological substances: Secondary | ICD-10-CM

## 2019-05-10 DIAGNOSIS — L500 Allergic urticaria: Secondary | ICD-10-CM

## 2019-05-10 DIAGNOSIS — T783XXD Angioneurotic edema, subsequent encounter: Secondary | ICD-10-CM

## 2019-05-10 DIAGNOSIS — T7840XD Allergy, unspecified, subsequent encounter: Secondary | ICD-10-CM | POA: Diagnosis not present

## 2019-05-10 MED ORDER — FAMOTIDINE 20 MG PO TABS: 20.0000 mg | ORAL_TABLET | Freq: Two times a day (BID) | ORAL | 5 refills | Status: DC

## 2019-05-10 MED ORDER — MONTELUKAST SODIUM 10 MG PO TABS: 10.0000 mg | ORAL_TABLET | Freq: Every day | ORAL | 5 refills | Status: DC

## 2019-05-10 NOTE — Progress Notes (Signed)
[facility] [place] [facility] [place]   Follow-up Note  Referring Provider: Jodelle Green, FNP Primary Provider: Jodelle Green, FNP Date of Office Visit: 05/10/2019  Subjective:   Anita Gibbs (DOB: 01/27/1971) is a 48 y.o. 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  I last saw her in this clinic during her initial evaluation of 17 August 2018.  During her last visit we had her utilize a H1 and H2 receptor blocker on a consistent basis and we had her evaluated for systemic disease with various blood tests all of which returned normal other than a slightly elevated alpha gal IgE antibody.  She did well with resolution of her pruritus and resolution of allergic reactions until about 1 month ago.  At that point in time she developed pruritus across her body and has developed a reaction sometime in August where she developed an itchy swelling area on both thighs for which she was treated with a systemic steroid with complete resolution.  She has no associated systemic or constitutional symptoms.  She has not really had a significant change in her environment or medications or supplement use or any obvious trigger giving rise to her this issue.  She continues on cetirizine and famotidine and has been adding in Benadryl twice a day.  Allergies as of 05/10/2019      Reactions   Other Hives   White and red sauces   Shellfish Allergy Hives   Strawberry (diagnostic) Hives   Tomato Hives   cherry      Medication List    albuterol 108 (90 Base) MCG/ACT inhaler Commonly known as: VENTOLIN HFA Inhale 2 puffs into the lungs every 6 (six) hours as needed for wheezing or shortness of breath.   amLODipine 5 MG tablet Commonly known as: NORVASC Take 1 tablet (5 mg total) by mouth daily.    diphenhydrAMINE 25 mg capsule Commonly known as: BENADRYL Take 25 mg by mouth every 6 (six) hours as needed (for allergies.).   EPINEPHrine 0.3 mg/0.3 mL Soaj injection Commonly known as: Auvi-Q Use as directed for severe allergic reaction   famotidine 20 MG tablet Commonly known as: PEPCID Take 1 tablet (20 mg total) by mouth 2 (two) times daily.   fluticasone 50 MCG/ACT nasal spray Commonly known as: FLONASE Place 2 sprays into both nostrils daily.   gabapentin 300 MG capsule Commonly known as: NEURONTIN Take 300 mg by mouth 3 (three) times daily.   hydrOXYzine 10 MG tablet Commonly known as: ATARAX/VISTARIL Take 1 tablet (10 mg total) by mouth 3 (three) times daily as needed for itching. Can cause drowsiness   losartan 100 MG tablet Commonly known as: COZAAR Take 1 tablet (100 mg total) by mouth daily.   methocarbamol 750 MG tablet Commonly known as: ROBAXIN Take 1 tablet (750 mg total) by mouth every 8 (eight) hours as needed for muscle spasms.   montelukast 10 MG tablet Commonly known as: SINGULAIR TAKE 1 TABLET BY MOUTH AT BEDTIME   norethindrone 5 MG tablet Commonly known as: AYGESTIN   oxyCODONE 5 MG immediate release tablet Commonly known as: Oxy IR/ROXICODONE Take 1 tablet (5 mg total) by mouth daily as needed for up to 30 days for severe pain. Must last 30 days   oxyCODONE 5 MG immediate release tablet Commonly known  as: Oxy IR/ROXICODONE Take 1 tablet (5 mg total) by mouth daily as needed for up to 30 days for severe pain. Must last 30 days   oxyCODONE 5 MG immediate release tablet Commonly known as: Oxy IR/ROXICODONE Take 1 tablet (5 mg total) by mouth daily as needed for up to 30 days for severe pain. Must last 30 days   triamcinolone cream 0.1 % Commonly known as: KENALOG Apply 1 application topically 2 (two) times daily. Use thin layer on affected area. Do not use for more than 7 days at a time.   ZYRTEC PO Take 25 mg by mouth.       Past  Medical History:  Diagnosis Date  . Arthritis   . Carpal boss, right   . Carpal tunnel syndrome, bilateral   . Headache   . Hypertension   . Seasonal asthma 11/29/2018    Past Surgical History:  Procedure Laterality Date  . ANTERIOR CERVICAL DECOMP/DISCECTOMY FUSION N/A 07/01/2017   Procedure: ANTERIOR CERVICAL DECOMPRESSION/DISCECTOMY FUSION 3 LEVELS;  Surgeon: Meade Maw, MD;  Location: [facility] ORS;  Service: Neurosurgery;  Laterality: N/A;  . KNEE ARTHROSCOPY Right     Review of systems negative except as noted in HPI / PMHx or noted below:  Review of Systems  Constitutional: Negative.   HENT: Negative.   Eyes: Negative.   Respiratory: Negative.   Cardiovascular: Negative.   Gastrointestinal: Negative.   Genitourinary: Negative.   Musculoskeletal: Negative.   Skin: Negative.   Neurological: Negative.   Endo/Heme/Allergies: Negative.   Psychiatric/Behavioral: Negative.      Objective:   Vitals:   05/10/19 1008  BP: (!) 160/98  Pulse: 78  Resp: 16  Temp: 98.1 F (36.7 C)  SpO2: 98%          Physical Exam Constitutional:      Appearance: She is not diaphoretic.  HENT:     Head: Normocephalic.     Right Ear: Tympanic membrane, ear canal and external ear normal.     Left Ear: Tympanic membrane, ear canal and external ear normal.     Nose: Nose normal. No mucosal edema or rhinorrhea.     Mouth/Throat:     Pharynx: Uvula midline. No oropharyngeal exudate.  Eyes:     Conjunctiva/sclera: Conjunctivae normal.  Neck:     Thyroid: No thyromegaly.     Trachea: Trachea normal. No tracheal tenderness or tracheal deviation.  Cardiovascular:     Rate and Rhythm: Normal rate and regular rhythm.     Heart sounds: Normal heart sounds, S1 normal and S2 normal. No murmur.  Pulmonary:     Effort: No respiratory distress.     Breath sounds: Normal breath sounds. No stridor. No wheezing or rales.  Lymphadenopathy:     Head:     Right side of head: No tonsillar  adenopathy.     Left side of head: No tonsillar adenopathy.     Cervical: No cervical adenopathy.  Skin:    Findings: No erythema or rash.     Nails: There is no clubbing.   Neurological:     Mental Status: She is alert.     Diagnostics:   Results of blood tests obtained 17 August 2018 identified TSH 1.390 IU/mL, thyroid peroxidase antibody 14 IU/mL, free T4 1.33 NG/DL, negative IgE antibodies directed against seafood panel, alpha gal IgE 0.13 KU/L, no IgE antibodies directed against beef, Lamb, pork   Assessment and Plan:   1. Allergic reaction, subsequent encounter   2.  Allergic urticaria   3. Angioedema, subsequent encounter   4. Multiple environmental allergies     1.  Allergen avoidance measures - no mammal for 4 weeks: Helps?  2.  Auvi-Q 0.3, Benadryl, MD/ER evaluation for allergic reaction  3.  Every day use the following medications:   A.  Cetirizine 10 mg 1-2 tablets twice a day (MAX=40mg )  B.  Famotidine 20 mg tablet twice a day  C.  Montelukast 10 mg - 1 tablet 1 time per day  4.  Can add OTC Benadryl if needed   5.  Return to clinic in 12 weeks or earlier if problem  6.  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  There does not appear to be any obvious provoking factor giving rise to this issue.  We will have her empirically perform avoidance measures directed against mammal consumption given her positive alpha gal IgE antibody.  She will contact me at the end of 4 weeks and will make a decision about this dietary restriction pending her response to this approach.  I will see her back in this clinic in 12 weeks or earlier if there is a problem.  Allena Katz, MD Allergy / Immunology [place]

## 2019-05-10 NOTE — Patient Instructions (Addendum)
  1.  Allergen avoidance measures - no mammal for 4 weeks: Helps?  2.  Auvi-Q 0.3, Benadryl, MD/ER evaluation for allergic reaction  3.  Every day use the following medications:   A.  Cetirizine 10 mg 1-2 tablets twice a day (MAX=40mg )  B.  Famotidine 20 mg tablet twice a day  C.  Montelukast 10 mg - 1 tablet 1 time per day  4.  Can add OTC Benadryl if needed   5.  Return to clinic in 12 weeks or earlier if problem  6.  Obtain fall flu vaccine (and COVID vaccine)

## 2019-05-11 ENCOUNTER — Encounter: Payer: Self-pay | Admitting: Allergy and Immunology

## 2019-05-18 ENCOUNTER — Encounter: Payer: BC Managed Care – PPO | Admitting: Pain Medicine

## 2019-05-19 ENCOUNTER — Other Ambulatory Visit: Payer: Self-pay

## 2019-05-19 ENCOUNTER — Encounter: Payer: Self-pay | Admitting: Pain Medicine

## 2019-05-19 ENCOUNTER — Ambulatory Visit
Admission: RE | Admit: 2019-05-19 | Discharge: 2019-05-19 | Disposition: A | Discharge status: Home / Self Care | Payer: BC Managed Care – PPO | Source: Ambulatory Visit | Attending: Pain Medicine | Admitting: Pain Medicine

## 2019-05-19 ENCOUNTER — Ambulatory Visit (HOSPITAL_BASED_OUTPATIENT_CLINIC_OR_DEPARTMENT_OTHER): Payer: BC Managed Care – PPO | Admitting: Pain Medicine

## 2019-05-19 VITALS — BP 133/72 | HR 62 | Temp 97.8°F | Resp 21 | Ht 63.0 in | Wt 228.0 lb

## 2019-05-19 DIAGNOSIS — M47817 Spondylosis without myelopathy or radiculopathy, lumbosacral region: Secondary | ICD-10-CM

## 2019-05-19 DIAGNOSIS — M47816 Spondylosis without myelopathy or radiculopathy, lumbar region: Secondary | ICD-10-CM

## 2019-05-19 DIAGNOSIS — M5136 Other intervertebral disc degeneration, lumbar region: Secondary | ICD-10-CM | POA: Insufficient documentation

## 2019-05-19 DIAGNOSIS — M545 Low back pain, unspecified: Secondary | ICD-10-CM

## 2019-05-19 DIAGNOSIS — G8929 Other chronic pain: Secondary | ICD-10-CM | POA: Insufficient documentation

## 2019-05-19 HISTORY — DX: Spondylosis without myelopathy or radiculopathy, lumbosacral region: M47.817

## 2019-05-19 MED ORDER — TRIAMCINOLONE ACETONIDE 40 MG/ML IJ SUSP
80.0000 mg | Freq: Once | INTRAMUSCULAR | Status: AC
  Administered 2019-05-19: 80 mg
  Filled 2019-05-19: qty 2

## 2019-05-19 MED ORDER — ROPIVACAINE HCL 2 MG/ML IJ SOLN
18.0000 mL | Freq: Once | INTRAMUSCULAR | Status: AC
  Administered 2019-05-19: 18 mL via PERINEURAL
  Filled 2019-05-19: qty 20

## 2019-05-19 MED ORDER — FENTANYL CITRATE (PF) 100 MCG/2ML IJ SOLN
25.0000 ug | INTRAMUSCULAR | Status: DC | PRN
  Administered 2019-05-19: 50 ug via INTRAVENOUS
  Filled 2019-05-19: qty 2

## 2019-05-19 MED ORDER — MIDAZOLAM HCL 5 MG/5ML IJ SOLN
1.0000 mg | INTRAMUSCULAR | Status: DC | PRN
  Administered 2019-05-19: 2 mg via INTRAVENOUS
  Filled 2019-05-19: qty 5

## 2019-05-19 MED ORDER — LIDOCAINE HCL 2 % IJ SOLN
20.0000 mL | Freq: Once | INTRAMUSCULAR | Status: AC
  Administered 2019-05-19: 400 mg
  Filled 2019-05-19: qty 20

## 2019-05-19 MED ORDER — LACTATED RINGERS IV SOLN
1000.0000 mL | Freq: Once | INTRAVENOUS | Status: AC
  Administered 2019-05-19: 1000 mL via INTRAVENOUS

## 2019-05-19 NOTE — Patient Instructions (Signed)

## 2019-05-19 NOTE — Progress Notes (Signed)
Patient's Name: Anita Gibbs  MRN: [id number]  Referring Provider: Milinda Pointer, MD  DOB: 1971-07-03  PCP: Jodelle Green, FNP  DOS: 05/19/2019  Note by: Gaspar Cola, MD  Service setting: Ambulatory outpatient  Specialty: Interventional Pain Management  Patient type: Established  Location: [facility] (AMB) Pain Management Facility  Visit type: Interventional Procedure   Primary Reason for Visit: Interventional Pain Management Treatment. CC: Back Pain (lower)  Procedure:          Anesthesia, Analgesia, Anxiolysis:  Type: Lumbar Facet, Medial Branch Block(s)          Primary Purpose: Diagnostic Region: Posterolateral Lumbosacral Spine Level: L2, L3, L4, L5, & S1 Medial Branch Level(s). Injecting these levels blocks the L3-4, L4-5, and L5-S1 lumbar facet joints. Laterality: Bilateral  Type: Moderate (Conscious) Sedation combined with Local Anesthesia Indication(s): Analgesia and Anxiety Route: Intravenous (IV) IV Access: Secured Sedation: Meaningful verbal contact was maintained at all times during the procedure  Local Anesthetic: Lidocaine 1-2%  Position: Prone   Indications: 1. Lumbar facet syndrome (Bilateral)   2. Spondylosis without myelopathy or radiculopathy, lumbosacral region   3. DDD (degenerative disc disease), lumbar   4. Chronic low back pain [facility]) (Bilateral) (R>L) w/o sciatica    Pain Score: Pre-procedure: 8 /10 Post-procedure: 0-No pain/10   Pre-op Assessment:  Ms. Fedie is a 48 y.o. (year old), female patient, seen today for interventional treatment. She  has a past surgical history that includes Knee arthroscopy (Right) and Anterior cervical decomp/discectomy fusion (N/A, 07/01/2017). Ms. Erbes has a current medication list which includes the following prescription(s): albuterol, amlodipine, cetirizine hcl, diphenhydramine, epinephrine, famotidine, fluticasone, gabapentin, hydroxyzine, losartan, methocarbamol, montelukast, norethindrone,  oxycodone, triamcinolone cream, oxycodone, and oxycodone, and the following Facility-Administered Medications: fentanyl and midazolam. Her primarily concern today is the Back Pain (lower)  Initial Vital Signs:  Pulse/HCG Rate: 65ECG Heart Rate: 60 Temp: 99.3 F (37.4 C) Resp: 18 BP: (!) 146/90 SpO2: 99 %  BMI: Estimated body mass index is 40.39 kg/m as calculated from the following:   Height as of this encounter: 5\' 3"  (1.6 m).   Weight as of this encounter: 228 lb (103.4 kg).  Risk Assessment: Allergies: Reviewed. She is allergic to other; shellfish allergy; strawberry (diagnostic); and tomato.  Allergy Precautions: None required Coagulopathies: Reviewed. None identified.  Blood-thinner therapy: None at this time Active Infection(s): Reviewed. None identified. Ms. Begnoche is afebrile  Site Confirmation: Ms. Schwieger was asked to confirm the procedure and laterality before marking the site Procedure checklist: Completed Consent: Before the procedure and under the influence of no sedative(s), amnesic(s), or anxiolytics, the patient was informed of the treatment options, risks and possible complications. To fulfill our ethical and legal obligations, as recommended by the American Medical Association's Code of Ethics, I have informed the patient of my clinical impression; the nature and purpose of the treatment or procedure; the risks, benefits, and possible complications of the intervention; the alternatives, including doing nothing; the risk(s) and benefit(s) of the alternative treatment(s) or procedure(s); and the risk(s) and benefit(s) of doing nothing. The patient was provided information about the general risks and possible complications associated with the procedure. These may include, but are not limited to: failure to achieve desired goals, infection, bleeding, organ or nerve damage, allergic reactions, paralysis, and death. In addition, the patient was informed of those risks and  complications associated to Spine-related procedures, such as failure to decrease pain; infection (i.e.: Meningitis, epidural or intraspinal abscess); bleeding (i.e.: epidural  hematoma, subarachnoid hemorrhage, or any other type of intraspinal or peri-dural bleeding); organ or nerve damage (i.e.: Any type of peripheral nerve, nerve root, or spinal cord injury) with subsequent damage to sensory, motor, and/or autonomic systems, resulting in permanent pain, numbness, and/or weakness of one or several areas of the body; allergic reactions; (i.e.: anaphylactic reaction); and/or death. Furthermore, the patient was informed of those risks and complications associated with the medications. These include, but are not limited to: allergic reactions (i.e.: anaphylactic or anaphylactoid reaction(s)); adrenal axis suppression; blood sugar elevation that in diabetics may result in ketoacidosis or comma; water retention that in patients with history of congestive heart failure may result in shortness of breath, pulmonary edema, and decompensation with resultant heart failure; weight gain; swelling or edema; medication-induced neural toxicity; particulate matter embolism and blood vessel occlusion with resultant organ, and/or nervous system infarction; and/or aseptic necrosis of one or more joints. Finally, the patient was informed that Medicine is not an exact science; therefore, there is also the possibility of unforeseen or unpredictable risks and/or possible complications that may result in a catastrophic outcome. The patient indicated having understood very clearly. We have given the patient no guarantees and we have made no promises. Enough time was given to the patient to ask questions, all of which were answered to the patient's satisfaction. Ms. Dennin has indicated that she wanted to continue with the procedure. Attestation: I, the ordering provider, attest that I have discussed with the patient the benefits, risks,  side-effects, alternatives, likelihood of achieving goals, and potential problems during recovery for the procedure that I have provided informed consent. Date   Time: 05/19/2019  9:46 AM  Pre-Procedure Preparation:  Monitoring: As per clinic protocol. Respiration, ETCO2, SpO2, BP, heart rate and rhythm monitor placed and checked for adequate function Safety Precautions: Patient was assessed for positional comfort and pressure points before starting the procedure. Time-out: I initiated and conducted the "Time-out" before starting the procedure, as per protocol. The patient was asked to participate by confirming the accuracy of the "Time Out" information. Verification of the correct person, site, and procedure were performed and confirmed by me, the nursing staff, and the patient. "Time-out" conducted as per Joint Commission's Universal Protocol (UP.01.01.01). Time: 1038  Description of Procedure:          Laterality: Bilateral. The procedure was performed in identical fashion on both sides. Levels:  L2, L3, L4, L5, & S1 Medial Branch Level(s) Area Prepped: Posterior Lumbosacral Region Prepping solution: DuraPrep (Iodine Povacrylex [0.7% available iodine] and Isopropyl Alcohol, 74% w/w) Safety Precautions: Aspiration looking for blood return was conducted prior to all injections. At no point did we inject any substances, as a needle was being advanced. Before injecting, the patient was told to immediately notify me if she was experiencing any new onset of "ringing in the ears, or metallic taste in the mouth". No attempts were made at seeking any paresthesias. Safe injection practices and needle disposal techniques used. Medications properly checked for expiration dates. SDV (single dose vial) medications used. After the completion of the procedure, all disposable equipment used was discarded in the proper designated medical waste containers. Local Anesthesia: Protocol guidelines were followed. The  patient was positioned over the fluoroscopy table. The area was prepped in the usual manner. The time-out was completed. The target area was identified using fluoroscopy. A 12-in long, straight, sterile hemostat was used with fluoroscopic guidance to locate the targets for each level blocked. Once located, the skin was marked  with an approved surgical skin marker. Once all sites were marked, the skin (epidermis, dermis, and hypodermis), as well as deeper tissues (fat, connective tissue and muscle) were infiltrated with a small amount of a short-acting local anesthetic, loaded on a 10cc syringe with a 25G, 1.5-in  Needle. An appropriate amount of time was allowed for local anesthetics to take effect before proceeding to the next step. Local Anesthetic: Lidocaine 2.0% The unused portion of the local anesthetic was discarded in the proper designated containers. Technical explanation of process:  L2 Medial Branch Nerve Block (MBB): The target area for the L2 medial branch is at the junction of the postero-lateral aspect of the superior articular process and the superior, posterior, and medial edge of the transverse process of L3. Under fluoroscopic guidance, a Quincke needle was inserted until contact was made with os over the superior postero-lateral aspect of the pedicular shadow (target area). After negative aspiration for blood, 0.5 mL of the nerve block solution was injected without difficulty or complication. The needle was removed intact. L3 Medial Branch Nerve Block (MBB): The target area for the L3 medial branch is at the junction of the postero-lateral aspect of the superior articular process and the superior, posterior, and medial edge of the transverse process of L4. Under fluoroscopic guidance, a Quincke needle was inserted until contact was made with os over the superior postero-lateral aspect of the pedicular shadow (target area). After negative aspiration for blood, 0.5 mL of the nerve block solution  was injected without difficulty or complication. The needle was removed intact. L4 Medial Branch Nerve Block (MBB): The target area for the L4 medial branch is at the junction of the postero-lateral aspect of the superior articular process and the superior, posterior, and medial edge of the transverse process of L5. Under fluoroscopic guidance, a Quincke needle was inserted until contact was made with os over the superior postero-lateral aspect of the pedicular shadow (target area). After negative aspiration for blood, 0.5 mL of the nerve block solution was injected without difficulty or complication. The needle was removed intact. L5 Medial Branch Nerve Block (MBB): The target area for the L5 medial branch is at the junction of the postero-lateral aspect of the superior articular process and the superior, posterior, and medial edge of the sacral ala. Under fluoroscopic guidance, a Quincke needle was inserted until contact was made with os over the superior postero-lateral aspect of the pedicular shadow (target area). After negative aspiration for blood, 0.5 mL of the nerve block solution was injected without difficulty or complication. The needle was removed intact. S1 Medial Branch Nerve Block (MBB): The target area for the S1 medial branch is at the posterior and inferior 6 o'clock position of the L5-S1 facet joint. Under fluoroscopic guidance, the Quincke needle inserted for the L5 MBB was redirected until contact was made with os over the inferior and postero aspect of the sacrum, at the 6 o' clock position under the L5-S1 facet joint (Target area). After negative aspiration for blood, 0.5 mL of the nerve block solution was injected without difficulty or complication. The needle was removed intact.  Nerve block solution: 0.2% PF-Ropivacaine + Triamcinolone (40 mg/mL) diluted to a final concentration of 4 mg of Triamcinolone/mL of Ropivacaine The unused portion of the solution was discarded in the proper  designated containers. Procedural Needles: 22-gauge, 3.5-inch, Quincke needles used for all levels.  Once the entire procedure was completed, the treated area was cleaned, making sure to leave some of the prepping  solution back to take advantage of its long term bactericidal properties.   Illustration of the posterior view of the lumbar spine and the posterior neural structures. Laminae of L2 through S1 are labeled. DPRL5, dorsal primary ramus of L5; DPRS1, dorsal primary ramus of S1; DPR3, dorsal primary ramus of L3; FJ, facet (zygapophyseal) joint L3-L4; I, inferior articular process of L4; LB1, lateral branch of dorsal primary ramus of L1; IAB, inferior articular branches from L3 medial branch (supplies L4-L5 facet joint); IBP, intermediate branch plexus; MB3, medial branch of dorsal primary ramus of L3; NR3, third lumbar nerve root; S, superior articular process of L5; SAB, superior articular branches from L4 (supplies L4-5 facet joint also); TP3, transverse process of L3.  Vitals:   05/19/19 1053 05/19/19 1057 05/19/19 1107 05/19/19 1117  BP: (!) 145/76 137/77 135/70 133/72  Pulse: 62     Resp: 16 11 18  (!) 21  Temp:  97.7 F (36.5 C)  97.8 F (36.6 C)  TempSrc:  Temporal  Temporal  SpO2: 99% 100% 98% 100%  Weight:      Height:         Start Time: 1038 hrs. End Time: 1050 hrs.  Imaging Guidance (Spinal):          Type of Imaging Technique: Fluoroscopy Guidance (Spinal) Indication(s): Assistance in needle guidance and placement for procedures requiring needle placement in or near specific anatomical locations not easily accessible without such assistance. Exposure Time: Please see nurses notes. Contrast: None used. Fluoroscopic Guidance: I was personally present during the use of fluoroscopy. "Tunnel Vision Technique" used to obtain the best possible view of the target area. Parallax error corrected before commencing the procedure. "Direction-depth-direction" technique used to  introduce the needle under continuous pulsed fluoroscopy. Once target was reached, antero-posterior, oblique, and lateral fluoroscopic projection used confirm needle placement in all planes. Images permanently stored in EMR. Interpretation: No contrast injected. I personally interpreted the imaging intraoperatively. Adequate needle placement confirmed in multiple planes. Permanent images saved into the patient's record.  Antibiotic Prophylaxis:   Anti-infectives (From admission, onward)   None     Indication(s): None identified  Post-operative Assessment:  Post-procedure Vital Signs:  Pulse/HCG Rate: 6267 Temp: 97.8 F (36.6 C) Resp: (!) 21 BP: 133/72 SpO2: 100 %  EBL: None  Complications: No immediate post-treatment complications observed by team, or reported by patient.  Note: The patient tolerated the entire procedure well. A repeat set of vitals were taken after the procedure and the patient was kept under observation following institutional policy, for this type of procedure. Post-procedural neurological assessment was performed, showing return to baseline, prior to discharge. The patient was provided with post-procedure discharge instructions, including a section on how to identify potential problems. Should any problems arise concerning this procedure, the patient was given instructions to immediately contact us, at any time, without hesitation. In any case, we plan to contact the patient by telephone for a follow-up status report regarding this interventional procedure.  Comments:  No additional relevant information.  Plan of Care  Orders:  Orders Placed This Encounter  Procedures   LUMBAR FACET(MEDIAL BRANCH NERVE BLOCK) MBNB    Scheduling Instructions:     Side: Bilateral     Level: L3-4, L4-5, & L5-S1 Facets (L2, L3, L4, L5, & S1 Medial Branch Nerves)     Sedation: With Sedation.     Timeframe: Today    Order Specific Question:   Where will this procedure be  performed?    Answer:  [facility]   DG PAIN CLINIC C-ARM 1-60 MIN NO REPORT    Intraoperative interpretation by procedural physician at [facility].    Standing Status:   Standing    Number of Occurrences:   1    Order Specific Question:   Reason for exam:    Answer:   Assistance in needle guidance and placement for procedures requiring needle placement in or near specific anatomical locations not easily accessible without such assistance.   Informed Consent Details: Physician/Practitioner Attestation; Transcribe to consent form and obtain patient signature    Surgeon/Provider: Merick Kelleher A. Dossie Arbour, MD (Pain Management Specialist) Attestation: I, Cosmos Dossie Arbour, MD, the physician/practitioner, attest that I have discussed with the patient the benefits, risks, side effects, alternatives, likelihood of achieving goals and potential problems during recovery for the procedure that I have provided informed consent.    Scheduling Instructions:     Procedure: Lumbar Facet Block  under fluoroscopic guidance     Indication(s): Low Back Pain, with our without leg pain, due to Facet Joint Arthralgia (Joint Pain) known as Lumbar Facet Syndrome, secondary to Lumbar, and/or Lumbosacral Spondylosis (Arthritis of the Spine), without myelopathy or radiculopathy (Nerve Damage).     Note: Always confirm laterality of pain with Ms. Akers, before procedure.   Chronic Opioid Analgesic:  Oxycodone IR 5 mg, 1 tab PO QD (5 mg/day of oxycodone) (has enough to last until 11/28/2019) (please see the 04/20/2019 note.) MME/day:7.5mg /day.   Medications ordered for procedure: Meds ordered this encounter  Medications   lidocaine (XYLOCAINE) 2 % (with pres) injection 400 mg   lactated ringers infusion 1,000 mL   midazolam (VERSED) 5 MG/5ML injection 1-2 mg    Make sure Flumazenil is available in the pyxis when using this medication. If oversedation occurs, administer 0.2 mg IV over 15 sec.  If after 45 sec no response, administer 0.2 mg again over 1 min; may repeat at 1 min intervals; not to exceed 4 doses (1 mg)   fentaNYL (SUBLIMAZE) injection 25-50 mcg    Make sure Narcan is available in the pyxis when using this medication. In the event of respiratory depression (RR< 8/min): Titrate NARCAN (naloxone) in increments of 0.1 to 0.2 mg IV at 2-3 minute intervals, until desired degree of reversal.   ropivacaine (PF) 2 mg/mL (0.2%) (NAROPIN) injection 18 mL   triamcinolone acetonide (KENALOG-40) injection 80 mg   Medications administered: We administered lidocaine, lactated ringers, midazolam, fentaNYL, ropivacaine (PF) 2 mg/mL (0.2%), and triamcinolone acetonide.  See the medical record for exact dosing, route, and time of administration.  Follow-up plan:   Return in about 2 weeks (around 06/02/2019) for (VV), (PP).       Interventional management options:  Considering:   Possible bilateral cervical facetRFA Diagnostic right L3-4 LESI Diagnostic bilateral L3 vs L4 TFESI  Diagnostic bilateral lumbar facet block Possible bilateral lumbar facetRFA Diagnostic right vs bilateral S-I joint injection Possible right vs bilateral S-I jointRFA   Palliative PRN treatment(s):   Palliative right-sided CESI #4  Diagnostic bilateral cervicalfacet nerve block #2      Recent Visits Date Type Provider Dept  04/20/19 Office Visit Milinda Pointer, MD [facility]  03/22/19 Procedure visit Milinda Pointer, MD [facility]  03/17/19 Office Visit Milinda Pointer, MD [facility]  03/01/19 Procedure visit Milinda Pointer, MD [facility]  02/21/19 Office Visit Milinda Pointer, MD [facility]  Showing recent visits within past 90 days and meeting all other requirements  Today's Visits Date Type Provider Dept  05/19/19 Procedure visit Milinda Pointer, MD [facility]  Showing today's visits and meeting  all other requirements   Future Appointments Date Type Provider Dept  06/13/19 Appointment Milinda Pointer, MD [facility]  Showing future appointments within next 90 days and meeting all other requirements   Disposition: Discharge home  Discharge Date & Time: 05/19/2019; 1120 hrs.   Primary Care Physician: Jodelle Green, FNP Location: [facility] Note by: Gaspar Cola, MD Date: 05/19/2019; Time: 12:53 PM  Disclaimer:  Medicine is not an Chief Strategy Officer. The only guarantee in medicine is that nothing is guaranteed. It is important to note that the decision to proceed with this intervention was based on the information collected from the patient. The Data and conclusions were drawn from the patient's questionnaire, the interview, and the physical examination. Because the information was provided in large part by the patient, it cannot be guaranteed that it has not been purposely or unconsciously manipulated. Every effort has been made to obtain as much relevant data as possible for this evaluation. It is important to note that the conclusions that lead to this procedure are derived in large part from the available data. Always take into account that the treatment will also be dependent on availability of resources and existing treatment guidelines, considered by other Pain Management Practitioners as being common knowledge and practice, at the time of the intervention. For Medico-Legal purposes, it is also important to point out that variation in procedural techniques and pharmacological choices are the acceptable norm. The indications, contraindications, technique, and results of the above procedure should only be interpreted and judged by a Board-Certified Interventional Pain Specialist with extensive familiarity and expertise in the same exact procedure and technique.

## 2019-05-19 NOTE — Progress Notes (Signed)
Safety precautions to be maintained throughout the outpatient stay will include: orient to surroundings, keep bed in low position, maintain call bell within reach at all times, provide assistance with transfer out of bed and ambulation.  

## 2019-05-20 ENCOUNTER — Telehealth: Payer: Self-pay

## 2019-05-20 NOTE — Telephone Encounter (Signed)
Patient states that she is having some nausea. Instructed to drink fluids and keep a watch on it . I asked if she was running a temperature she stated it was 100.4 last night. Informed her again to be cautious and watch both and if needed to call us back or seek help if needed.Patient with understanding

## 2019-05-25 ENCOUNTER — Ambulatory Visit (INDEPENDENT_AMBULATORY_CARE_PROVIDER_SITE_OTHER): Payer: BC Managed Care – PPO | Admitting: Family Medicine

## 2019-05-25 ENCOUNTER — Other Ambulatory Visit: Payer: Self-pay

## 2019-05-25 DIAGNOSIS — N898 Other specified noninflammatory disorders of vagina: Secondary | ICD-10-CM | POA: Diagnosis not present

## 2019-05-25 MED ORDER — FLUCONAZOLE 150 MG PO TABS: 150.0000 mg | ORAL_TABLET | Freq: Once | ORAL | 0 refills | Status: AC

## 2019-05-25 MED ORDER — METRONIDAZOLE 500 MG PO TABS: 500.0000 mg | ORAL_TABLET | Freq: Two times a day (BID) | ORAL | 0 refills | Status: DC

## 2019-05-25 NOTE — Progress Notes (Signed)
Patient ID: Anita Gibbs, female   DOB: [birth year], 48 y.o.   MRN: [id number]    Virtual Visit via video Note  This visit type was conducted due to national recommendations for restrictions regarding the COVID-19 pandemic (e.g. social distancing).  This format is felt to be most appropriate for this patient at this time.  All issues noted in this document were discussed and addressed.  No physical exam was performed (except for noted visual exam findings with Video Visits).   I connected with Garald Braver today at 10:20 AM EDT by a video enabled telemedicine application and verified that I am speaking with the correct person using two identifiers. Location patient: home Location provider: work or home office Persons participating in the virtual visit: patient, provider  I discussed the limitations, risks, security and privacy concerns of performing an evaluation and management service by video and the availability of in person appointments. I also discussed with the patient that there may be a patient responsible charge related to this service. The patient expressed understanding and agreed to proceed.   HPI:  Patient and I connected via video to discuss some vaginal discharge with odor.  States the discharge is white in color and does smell "fishy".  Patient denies any concerns for STDs.  Denies abdominal pain, difficulty urinating or any issues having bowel movements.  Menstrual cycles are normal.    ROS: See pertinent positives and negatives per HPI.  Past Medical History:  Diagnosis Date  . Arthritis   . Carpal boss, right   . Carpal tunnel syndrome, bilateral   . Headache   . Hypertension   . Seasonal asthma 11/29/2018    Past Surgical History:  Procedure Laterality Date  . ANTERIOR CERVICAL DECOMP/DISCECTOMY FUSION N/A 07/01/2017   Procedure: ANTERIOR CERVICAL DECOMPRESSION/DISCECTOMY FUSION 3 LEVELS;  Surgeon: Meade Maw, MD;  Location: [facility];  Service:  Neurosurgery;  Laterality: N/A;  . KNEE ARTHROSCOPY Right     Family History  Problem Relation Age of Onset  . [name] Mother   . Diabetes Mother   . Arthritis Mother   . Hypertension Mother   . Hyperlipidemia Mother   . Hypertension Father   . Arthritis Father   . Hearing loss Father   . Kidney disease Father   . Arthritis Sister   . Hypertension Sister   . Cancer Brother   . COPD Brother   . Early death Brother   . COPD Son   . Depression Son   . Breast cancer Maternal Aunt   . Breast cancer Paternal Aunt   . Allergic rhinitis Neg Hx   . Angioedema Neg Hx   . Eczema Neg Hx   . Urticaria Neg Hx   . Asthma Neg Hx    Social History   Tobacco Use  . Smoking status: Never Smoker  . Smokeless tobacco: Never Used  Substance Use Topics  . Alcohol use: Yes    Comment: occ     Current Outpatient Medications:  .  albuterol (PROVENTIL HFA;VENTOLIN HFA) 108 (90 Base) MCG/ACT inhaler, Inhale 2 puffs into the lungs every 6 (six) hours as needed for wheezing or shortness of breath., Disp: 1 Inhaler, Rfl: 5 .  amLODipine (NORVASC) 5 MG tablet, Take 1 tablet (5 mg total) by mouth daily., Disp: 90 tablet, Rfl: 1 .  Cetirizine HCl (ZYRTEC PO), Take 25 mg by mouth., Disp: , Rfl:  .  diphenhydrAMINE (BENADRYL) 25 mg capsule, Take 25 mg by mouth every 6 (six)  hours as needed (for allergies.)., Disp: , Rfl:  .  EPINEPHrine (AUVI-Q) 0.3 mg/0.3 mL IJ SOAJ injection, Use as directed for severe allergic reaction, Disp: 2 Device, Rfl: 2 .  famotidine (PEPCID) 20 MG tablet, Take 1 tablet (20 mg total) by mouth 2 (two) times daily., Disp: 60 tablet, Rfl: 5 .  fluticasone (FLONASE) 50 MCG/ACT nasal spray, Place 2 sprays into both nostrils daily., Disp: 16 g, Rfl: 6 .  gabapentin (NEURONTIN) 300 MG capsule, Take 300 mg by mouth 3 (three) times daily., Disp: , Rfl:  .  hydrOXYzine (ATARAX/VISTARIL) 10 MG tablet, Take 1 tablet (10 mg total) by mouth 3 (three) times daily as needed for itching. Can  cause drowsiness, Disp: 30 tablet, Rfl: 1 .  losartan (COZAAR) 100 MG tablet, Take 1 tablet (100 mg total) by mouth daily., Disp: 90 tablet, Rfl: 3 .  methocarbamol (ROBAXIN) 750 MG tablet, Take 1 tablet (750 mg total) by mouth every 8 (eight) hours as needed for muscle spasms., Disp: 90 tablet, Rfl: 2 .  montelukast (SINGULAIR) 10 MG tablet, Take 1 tablet (10 mg total) by mouth at bedtime., Disp: 30 tablet, Rfl: 5 .  norethindrone (AYGESTIN) 5 MG tablet, , Disp: , Rfl:  .  oxyCODONE (OXY IR/ROXICODONE) 5 MG immediate release tablet, Take 1 tablet (5 mg total) by mouth daily as needed for up to 30 days for severe pain. Must last 30 days, Disp: 30 tablet, Rfl: 0 .  triamcinolone cream (KENALOG) 0.1 %, Apply 1 application topically 2 (two) times daily. Use thin layer on affected area. Do not use for more than 7 days at a time., Disp: 30 g, Rfl: 0 .  oxyCODONE (OXY IR/ROXICODONE) 5 MG immediate release tablet, Take 1 tablet (5 mg total) by mouth daily as needed for up to 30 days for severe pain. Must last 30 days, Disp: 30 tablet, Rfl: 0 .  oxyCODONE (OXY IR/ROXICODONE) 5 MG immediate release tablet, Take 1 tablet (5 mg total) by mouth daily as needed for up to 30 days for severe pain. Must last 30 days, Disp: 30 tablet, Rfl: 0  EXAM:  GENERAL: alert, oriented, appears well and in no acute distress  HEENT: atraumatic, conjunttiva clear, no obvious abnormalities on inspection of external nose and ears  NECK: normal movements of the head and neck  LUNGS: on inspection no signs of respiratory distress, breathing rate appears normal, no obvious gross SOB, gasping or wheezing  CV: no obvious cyanosis  MS: moves all visible extremities without noticeable abnormality  PSYCH/NEURO: pleasant and cooperative, no obvious depression or anxiety, speech and thought processing grossly intact  ASSESSMENT AND PLAN:  Discussed the following assessment and plan:  1. Vaginal discharge Suspect vaginal  discharge could be related to a bacterial vaginosis and/or yeast infection.  We will treat for both.  She will take Flagyl twice daily for 7 days and also Diflucan x1.  Advised to cleanse body with mild soap such as a Dove white bar soap and rinse well with water.  Advised to avoid body washes that contain excessive scent as they can be irritating the skin especially in at the GU region.  Advised patient if symptoms do not improve with current treatment plan, she will have to come into clinic for vaginal exam and further testing.  Verbalizes understanding.  - metroNIDAZOLE (FLAGYL) 500 MG tablet; Take 1 tablet (500 mg total) by mouth 2 (two) times daily.  Dispense: 14 tablet; Refill: 0 - fluconazole (DIFLUCAN) 150 MG tablet;  Take 1 tablet (150 mg total) by mouth once for 1 dose.  Dispense: 1 tablet; Refill: 0    I discussed the assessment and treatment plan with the patient. The patient was provided an opportunity to ask questions and all were answered. The patient agreed with the plan and demonstrated an understanding of the instructions.   The patient was advised to call back or seek an in-person evaluation if the symptoms worsen or if the condition fails to improve as anticipated.  Jodelle Green, FNP

## 2019-06-09 ENCOUNTER — Telehealth: Payer: Self-pay | Admitting: *Deleted

## 2019-06-09 NOTE — Telephone Encounter (Signed)
Attempted to call for pre appointment review of meds/allergies. Message left. 

## 2019-06-10 ENCOUNTER — Encounter: Payer: Self-pay | Admitting: Pain Medicine

## 2019-06-12 NOTE — Progress Notes (Signed)
Pain Management Virtual Encounter Note - Virtual Visit via Telephone Telehealth (real-time audio visits between healthcare provider and patient).   Patient's Phone No. & Preferred Pharmacy:  [phone] (home); [phone] (mobile); (Preferred) [phone] [email or web address]  [place], [place] - [address] [place] [address] Phone: [phone] Fax: [phone]    Pre-screening note:  Our staff contacted Anita Gibbs and offered her an "in person", "face-to-face" appointment versus a telephone encounter. She indicated preferring the telephone encounter, at this time.   Reason for Virtual Visit: COVID-19*  Social distancing based on CDC and AMA recommendations.   I contacted Anita Gibbs on 06/13/2019 via telephone.      I clearly identified myself as Gaspar Cola, MD. I verified that I was speaking with the correct person using two identifiers (Name: Anita Gibbs, and date of birth: [birth year]).  Advanced Informed Consent I sought verbal advanced consent from Anita Gibbs for virtual visit interactions. I informed Anita Gibbs of possible security and privacy concerns, risks, and limitations associated with providing "not-in-person" medical evaluation and management services. I also informed Anita Gibbs of the availability of "in-person" appointments. Finally, I informed her that there would be a charge for the virtual visit and that she could be  personally, fully or partially, financially responsible for it. [name] expressed understanding and agreed to proceed.   Historic Elements   Anita Gibbs is a 48 y.o. year old, female patient evaluated today after her last encounter by our practice on 06/09/2019. Anita Gibbs  has a past medical history of Arthritis, Carpal boss, right, Carpal tunnel syndrome, bilateral, Headache, Hypertension, and Seasonal asthma (11/29/2018). She also  has a past surgical history that  includes Knee arthroscopy (Right) and Anterior cervical decomp/discectomy fusion (N/A, 07/01/2017). Anita Gibbs has a current medication list which includes the following prescription(s): albuterol, amlodipine, cetirizine hcl, diphenhydramine, epinephrine, famotidine, fluticasone, gabapentin, losartan, montelukast, norethindrone, methocarbamol, oxycodone, oxycodone, and oxycodone. She  reports that she has never smoked. She has never used smokeless tobacco. She reports current alcohol use. She reports that she does not use drugs. Anita Gibbs is allergic to other; shellfish allergy; strawberry (diagnostic); and tomato.   HPI  Today, she is being contacted for both, medication management and a post-procedure assessment.  According to the 04/20/2019 note, the patient should have enough pain medication to last until 11/28/2019.  The results of today's evaluation would suggest that the patient's low back pain is secondary to facet arthralgia and therefore she may be a good candidate for lumbar facet radiofrequency ablation, based on the 05/19/2019 diagnostic injection.  The patient indicates currently having absolutely no pain whatsoever.  This again would confirm that the pain is from a lumbar facet arthropathy.  I had today I have explained to the patient on the long-term plan is to wait to see if and when the pain comes back and if it does, we will go ahead and repeat the diagnostic injection.  If she again gets similar results, then she may be a good candidate for radiofrequency ablation.  She understood and accepted.  The patient indicates doing well with the current medication regimen. No adverse reactions or side effects reported to the medications.   Post-Procedure Evaluation  Procedure (05/19/2019): Diagnostic/therapeutic bilateral lumbar facet block #1 under fluoroscopic guidance and IV sedation Pre-procedure pain level:  8/10 Post-procedure: 0/10 (100% relief)  Sedation: Sedation provided.   Effectiveness during initial hour after procedure(Ultra-Short Term Relief):  100 %   Local anesthetic used: Long-acting (4-6 hours) Effectiveness: Defined as any analgesic benefit obtained secondary to the administration of local anesthetics. This carries significant diagnostic value as to the etiological location, or anatomical origin, of the pain. Duration of benefit is expected to coincide with the duration of the local anesthetic used.  Effectiveness during initial 4-6 hours after procedure(Short-Term Relief): 100 %   Long-term benefit: Defined as any relief past the pharmacologic duration of the local anesthetics.  Effectiveness past the initial 6 hours after procedure(Long-Term Relief): 100 %(ongoing)   Current benefits: Defined as benefit that persist at this time.   Analgesia:  90-100% better Function: Anita Gibbs reports improvement in function ROM: Anita Gibbs reports improvement in ROM  Pharmacotherapy Assessment  Analgesic: Oxycodone IR 5 mg, 1 tab PO QD (5 mg/day of oxycodone) (has enough to last until 11/28/2019) (please see the 04/20/2019 note.) MME/day:7.5mg /day.   Monitoring: Pharmacotherapy: No side-effects or adverse reactions reported. [place] PMP: PDMP reviewed during this encounter.       Compliance: No problems identified. Effectiveness: Clinically acceptable. Plan: Refer to "POC".  UDS:  Summary  Date Value Ref Range Status  09/07/2018 FINAL  Final    Comment:    ==================================================================== TOXASSURE COMP DRUG ANALYSIS,UR ==================================================================== Test                             Result       Flag       Units Drug Present and Declared for Prescription Verification   Oxycodone                      220          EXPECTED   ng/mg creat   Oxymorphone                    65           EXPECTED   ng/mg creat   Noroxycodone                   363          EXPECTED   ng/mg creat    Sources  of oxycodone include scheduled prescription medications.    Oxymorphone and noroxycodone are expected metabolites of    oxycodone. Oxymorphone is also available as a scheduled    prescription medication.   Gabapentin                     PRESENT      EXPECTED   Methocarbamol                  PRESENT      EXPECTED   Diphenhydramine                PRESENT      EXPECTED Drug Absent but Declared for Prescription Verification   Acetaminophen                  Not Detected UNEXPECTED    Acetaminophen, as indicated in the declared medication list, is    not always detected even when used as directed.   Salicylate                     Not Detected UNEXPECTED    Aspirin, as indicated in the declared medication list, is not    always detected even when used  as directed.   Naproxen                       Not Detected UNEXPECTED ==================================================================== Test                      Result    Flag   Units      Ref Range   Creatinine              207              mg/dL      >=20 ==================================================================== Declared Medications:  The flagging and interpretation on this report are based on the  following declared medications.  Unexpected results may arise from  inaccuracies in the declared medications.  **Note: The testing scope of this panel includes these medications:  Diphenhydramine (Benadryl)  Gabapentin  Methocarbamol (Robaxin)  Naproxen (Naprosyn)  Oxycodone  **Note: The testing scope of this panel does not include small to  moderate amounts of these reported medications:  Acetaminophen (Excedrin)  Aspirin (Excedrin)  **Note: The testing scope of this panel does not include following  reported medications:  Albuterol  Caffeine (Excedrin)  Epinephrine (EpiPen)  Famotidine (Pepcid)  Losartan (Cozaar)  Metronidazole (Flagyl) ==================================================================== For clinical  consultation, please call [phone]. ====================================================================    Laboratory Chemistry Profile (12 mo)  Renal: 09/07/2018: Gibbs/Creatinine Ratio 13 09/23/2018: Gibbs 11; Creatinine, Ser 0.89  Lab Results  Component Value Date   GFR 82.91 09/23/2018   GFRAA 76 09/07/2018   GFRNONAA 66 09/07/2018   Hepatic: 09/07/2018: Albumin 4.3 Lab Results  Component Value Date   AST 10 09/07/2018   ALT 10 08/03/2018   Other: 09/07/2018: 25-Hydroxy, Vitamin D 8.1; 25-Hydroxy, Vitamin D-2 <1.0; 25-Hydroxy, Vitamin D-3 8.1; CRP 7; Sed Rate 67; Vitamin B-12 601 Note: Above Lab results reviewed.  Imaging  Last 90 days:  Dg Pain Clinic C-arm 1-60 Min No Report  Result Date: 05/19/2019 Fluoro was used, but no Radiologist interpretation will be provided. Please refer to "NOTES" tab for provider progress note.  Dg Pain Clinic C-arm 1-60 Min No Report  Result Date: 03/22/2019 Fluoro was used, but no Radiologist interpretation will be provided. Please refer to "NOTES" tab for provider progress note.   Assessment  The primary encounter diagnosis was Chronic pain syndrome. Diagnoses of Chronic neck pain (Primary Area of Pain) (Bilateral) (L>R), Chronic upper extremity pain (Secondary Area of Pain) (Bilateral) (R>L), Chronic low back pain (Tertiary Area of Pain) (Bilateral) (R>L) w/o sciatica, Chronic lower extremity pain (Fourth Area of Pain) (Bilateral) (R>L), Lumbar facet syndrome (Bilateral) (R>L), and Chronic musculoskeletal pain were also pertinent to this visit.  Plan of Care  I have discontinued Anita Gibbs's oxyCODONE, oxyCODONE, hydrOXYzine, triamcinolone cream, and metroNIDAZOLE. I have also changed her oxyCODONE. Additionally, I am having her start on oxyCODONE and oxyCODONE. Lastly, I am having her maintain her diphenhydrAMINE, EPINEPHrine, losartan, fluticasone, albuterol, amLODipine, Cetirizine HCl (ZYRTEC PO), gabapentin, norethindrone, montelukast,  famotidine, and methocarbamol.  Pharmacotherapy (Medications Ordered): Meds ordered this encounter  Medications  . methocarbamol (ROBAXIN) 750 MG tablet    Sig: Take 1 tablet (750 mg total) by mouth every 8 (eight) hours as needed for muscle spasms.    Dispense:  90 tablet    Refill:  3    Fill one day early if pharmacy is closed on scheduled refill date. May substitute for generic if available.  Marland Kitchen oxyCODONE (OXY IR/ROXICODONE) 5 MG immediate release  tablet    Sig: Take 1 tablet (5 mg total) by mouth daily as needed for severe pain. Must last 30 days    Dispense:  30 tablet    Refill:  0    Chronic Pain: STOP Act (Not applicable) Fill 1 day early if closed on refill date. Do not fill until: 06/13/2019. To last until: 07/13/2019. Avoid benzodiazepines within 8 hours of opioids  . oxyCODONE (OXY IR/ROXICODONE) 5 MG immediate release tablet    Sig: Take 1 tablet (5 mg total) by mouth daily as needed for severe pain. Must last 30 days    Dispense:  30 tablet    Refill:  0    Chronic Pain: STOP Act (Not applicable) Fill 1 day early if closed on refill date. Do not fill until: 07/13/2019. To last until: 08/12/2019. Avoid benzodiazepines within 8 hours of opioids  . oxyCODONE (OXY IR/ROXICODONE) 5 MG immediate release tablet    Sig: Take 1 tablet (5 mg total) by mouth daily as needed for severe pain. Must last 30 days    Dispense:  30 tablet    Refill:  0    Chronic Pain: STOP Act (Not applicable) Fill 1 day early if closed on refill date. Do not fill until: 08/12/2019. To last until: 09/11/2019. Avoid benzodiazepines within 8 hours of opioids   Orders:  Orders Placed This Encounter  Procedures  . LUMBAR FACET(MEDIAL BRANCH NERVE BLOCK) MBNB    Scheduling timeframe: (PRN procedure) Ms. Baza will call when needed. Clinical indication: Axial low back pain. 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  Sedation: Usually done with sedation. (May be done without sedation if so desired by  patient.) Requirements: NPO x 8 hrs.; Driver; Stop blood thinners.    Standing Status:   Standing    Number of Occurrences:   5    Standing Expiration Date:   06/12/2020    Scheduling Instructions:     Procedure: Lumbar facet block (medial branch block).     Level: L3-4, L4-5, & L5-S1 Facets (L2, L3, L4, L5, & S1 Medial Branch Nerves)     Laterality: Bilateral    Order Specific Question:   Where will this procedure be performed?    Answer:   [facility]   Follow-up plan:   Return in about 3 months (around 09/07/2019) for (VV), (MM), in addition, PRN Procedure(s): (B) L-FCT BLK #2.      Interventional management options:  Considering:   Possible bilateral cervical facetRFA Diagnostic right L3-4 LESI Diagnostic bilateral L3 vs L4 TFESI  Possible bilateral lumbar facetRFA Diagnostic right vs bilateral SI joint injection Possible right vs bilateral SI jointRFA   Palliative PRN treatment(s):   Diagnostic/therapeutic bilateral lumbar facet block #2  Palliative right CESI #4  Diagnostic bilateral cervicalfacet block #2    Recent Visits Date Type Provider Dept  05/19/19 Procedure visit Milinda Pointer, MD [facility]  04/20/19 Office Visit Milinda Pointer, MD [facility]  03/22/19 Procedure visit Milinda Pointer, MD [facility]  03/17/19 Office Visit Milinda Pointer, MD [facility]  Showing recent visits within past 90 days and meeting all other requirements   Today's Visits Date Type Provider Dept  06/13/19 Office Visit Milinda Pointer, MD [facility]  Showing today's visits and meeting all other requirements   Future Appointments No visits were found meeting these conditions.  Showing future appointments within next 90 days and meeting all other requirements   I discussed the assessment and treatment plan with the  patient. The patient was provided an opportunity to ask questions and all were  answered. The patient agreed with the plan and demonstrated an understanding of the instructions.  Patient advised to call back or seek an in-person evaluation if the symptoms or condition worsens.  Total duration of non-face-to-face encounter: 13 minutes.  Note by: Gaspar Cola, MD Date: 06/13/2019; Time: 2:49 PM  Note: This dictation was prepared with Dragon dictation. Any transcriptional errors that may result from this process are unintentional.  Disclaimer:  * Given the special circumstances of the COVID-19 pandemic, the federal government has announced that the Office for Civil Rights (OCR) will exercise its enforcement discretion and will not impose penalties on physicians using telehealth in the event of noncompliance with regulatory requirements under the [place] (HIPAA) in connection with the good faith provision of telehealth during the XX123456 national public health emergency. ([place])

## 2019-06-13 ENCOUNTER — Ambulatory Visit: Payer: BC Managed Care – PPO | Attending: Pain Medicine | Admitting: Pain Medicine

## 2019-06-13 ENCOUNTER — Other Ambulatory Visit: Payer: Self-pay

## 2019-06-13 DIAGNOSIS — M79604 Pain in right leg: Secondary | ICD-10-CM

## 2019-06-13 DIAGNOSIS — M545 Low back pain, unspecified: Secondary | ICD-10-CM

## 2019-06-13 DIAGNOSIS — M79602 Pain in left arm: Secondary | ICD-10-CM

## 2019-06-13 DIAGNOSIS — G894 Chronic pain syndrome: Secondary | ICD-10-CM

## 2019-06-13 DIAGNOSIS — M79601 Pain in right arm: Secondary | ICD-10-CM

## 2019-06-13 DIAGNOSIS — M79605 Pain in left leg: Secondary | ICD-10-CM

## 2019-06-13 DIAGNOSIS — M542 Cervicalgia: Secondary | ICD-10-CM

## 2019-06-13 DIAGNOSIS — M7918 Myalgia, other site: Secondary | ICD-10-CM

## 2019-06-13 DIAGNOSIS — M47816 Spondylosis without myelopathy or radiculopathy, lumbar region: Secondary | ICD-10-CM

## 2019-06-13 DIAGNOSIS — G8929 Other chronic pain: Secondary | ICD-10-CM

## 2019-06-13 MED ORDER — OXYCODONE HCL 5 MG PO TABS: 5.0000 mg | ORAL_TABLET | Freq: Every day | ORAL | 0 refills | Status: DC | PRN

## 2019-06-13 MED ORDER — METHOCARBAMOL 750 MG PO TABS: 750.0000 mg | ORAL_TABLET | Freq: Three times a day (TID) | ORAL | 3 refills | Status: DC | PRN

## 2019-06-13 NOTE — Patient Instructions (Signed)

## 2019-07-21 ENCOUNTER — Other Ambulatory Visit: Payer: Self-pay

## 2019-07-21 ENCOUNTER — Encounter: Payer: Self-pay | Admitting: Pain Medicine

## 2019-07-21 ENCOUNTER — Ambulatory Visit (HOSPITAL_BASED_OUTPATIENT_CLINIC_OR_DEPARTMENT_OTHER): Payer: BC Managed Care – PPO | Admitting: Pain Medicine

## 2019-07-21 ENCOUNTER — Ambulatory Visit
Admission: RE | Admit: 2019-07-21 | Discharge: 2019-07-21 | Disposition: A | Discharge status: Home / Self Care | Payer: BC Managed Care – PPO | Source: Ambulatory Visit | Attending: Pain Medicine | Admitting: Pain Medicine

## 2019-07-21 VITALS — BP 139/68 | HR 90 | Temp 97.6°F | Resp 16 | Ht 63.0 in | Wt 228.0 lb

## 2019-07-21 DIAGNOSIS — M47817 Spondylosis without myelopathy or radiculopathy, lumbosacral region: Secondary | ICD-10-CM | POA: Diagnosis present

## 2019-07-21 DIAGNOSIS — M5136 Other intervertebral disc degeneration, lumbar region: Secondary | ICD-10-CM

## 2019-07-21 DIAGNOSIS — Z91013 Allergy to seafood: Secondary | ICD-10-CM | POA: Diagnosis present

## 2019-07-21 DIAGNOSIS — M545 Low back pain, unspecified: Secondary | ICD-10-CM

## 2019-07-21 DIAGNOSIS — G8929 Other chronic pain: Secondary | ICD-10-CM | POA: Insufficient documentation

## 2019-07-21 DIAGNOSIS — M47816 Spondylosis without myelopathy or radiculopathy, lumbar region: Secondary | ICD-10-CM | POA: Insufficient documentation

## 2019-07-21 MED ORDER — FENTANYL CITRATE (PF) 100 MCG/2ML IJ SOLN
25.0000 ug | INTRAMUSCULAR | Status: DC | PRN
  Administered 2019-07-21: 50 ug via INTRAVENOUS
  Filled 2019-07-21: qty 2

## 2019-07-21 MED ORDER — ROPIVACAINE HCL 2 MG/ML IJ SOLN
18.0000 mL | Freq: Once | INTRAMUSCULAR | Status: AC
  Administered 2019-07-21: 18 mL via PERINEURAL
  Filled 2019-07-21: qty 20

## 2019-07-21 MED ORDER — TRIAMCINOLONE ACETONIDE 40 MG/ML IJ SUSP
80.0000 mg | Freq: Once | INTRAMUSCULAR | Status: AC
  Administered 2019-07-21: 80 mg
  Filled 2019-07-21: qty 2

## 2019-07-21 MED ORDER — LACTATED RINGERS IV SOLN
1000.0000 mL | Freq: Once | INTRAVENOUS | Status: AC
  Administered 2019-07-21: 1000 mL via INTRAVENOUS

## 2019-07-21 MED ORDER — LIDOCAINE HCL 2 % IJ SOLN
20.0000 mL | Freq: Once | INTRAMUSCULAR | Status: AC
  Administered 2019-07-21: 400 mg
  Filled 2019-07-21: qty 40

## 2019-07-21 MED ORDER — MIDAZOLAM HCL 5 MG/5ML IJ SOLN
1.0000 mg | INTRAMUSCULAR | Status: DC | PRN
  Administered 2019-07-21: 1 mg via INTRAVENOUS
  Filled 2019-07-21: qty 5

## 2019-07-21 NOTE — Patient Instructions (Signed)

## 2019-07-21 NOTE — Progress Notes (Signed)
Patient's Name: Anita Gibbs  MRN: [id number]  Referring Provider: Milinda Pointer, MD  DOB: [birth year]  PCP: Jodelle Green, FNP  DOS: 07/21/2019  Note by: Gaspar Cola, MD  Service setting: Ambulatory outpatient  Specialty: Interventional Pain Management  Patient type: Established  Location: [facility] (AMB) Pain Management Facility  Visit type: Interventional Procedure   Primary Reason for Visit: Interventional Pain Management Treatment. CC: Back Pain (lower)  Procedure:          Anesthesia, Analgesia, Anxiolysis:  Type: Lumbar Facet, Medial Branch Block(s) #2  Primary Purpose: Diagnostic Region: Posterolateral Lumbosacral Spine Level: L2, L3, L4, L5, & S1 Medial Branch Level(s). Injecting these levels blocks the L3-4, L4-5, and L5-S1 lumbar facet joints. Laterality: Bilateral  Type: Moderate (Conscious) Sedation combined with Local Anesthesia Indication(s): Analgesia and Anxiety Route: Intravenous (IV) IV Access: Secured Sedation: Meaningful verbal contact was maintained at all times during the procedure  Local Anesthetic: Lidocaine 1-2%  Position: Prone   Indications: 1. Spondylosis without myelopathy or radiculopathy, lumbosacral region   2. Lumbar facet syndrome (Bilateral) (R>L)   3. DDD (degenerative disc disease), lumbar   4. Chronic low back pain [facility]) (Bilateral) (R>L) w/o sciatica    Pain Score: Pre-procedure: 8 /10 Post-procedure: 0-No pain/10   Pre-op Assessment:  Anita Gibbs is a 48 y.o. (year old), female patient, seen today for interventional treatment. She  has a past surgical history that includes Knee arthroscopy (Right) and Anterior cervical decomp/discectomy fusion (N/A, 07/01/2017). Anita Gibbs has a current medication list which includes the following prescription(s): albuterol, amlodipine, cetirizine hcl, diphenhydramine, epinephrine, famotidine, fluticasone, gabapentin, losartan, methocarbamol, montelukast, norethindrone, oxycodone,  oxycodone, and oxycodone, and the following Facility-Administered Medications: fentanyl and midazolam. Her primarily concern today is the Back Pain (lower)  Initial Vital Signs:  Pulse/HCG Rate: 90ECG Heart Rate: 62 Temp: (!) 97.3 F (36.3 C) Resp: 16 BP: 131/77 SpO2: 100 %  BMI: Estimated body mass index is 40.39 kg/m as calculated from the following:   Height as of this encounter: 5\' 3"  (1.6 m).   Weight as of this encounter: 228 lb (103.4 kg).  Risk Assessment: Allergies: Reviewed. She is allergic to other; shellfish allergy; strawberry (diagnostic); and tomato.  Allergy Precautions: None required Coagulopathies: Reviewed. None identified.  Blood-thinner therapy: None at this time Active Infection(s): Reviewed. None identified. Anita Gibbs is afebrile  Site Confirmation: Anita Gibbs was asked to confirm the procedure and laterality before marking the site Procedure checklist: Completed Consent: Before the procedure and under the influence of no sedative(s), amnesic(s), or anxiolytics, the patient was informed of the treatment options, risks and possible complications. To fulfill our ethical and legal obligations, as recommended by the American Medical Association's Code of Ethics, I have informed the patient of my clinical impression; the nature and purpose of the treatment or procedure; the risks, benefits, and possible complications of the intervention; the alternatives, including doing nothing; the risk(s) and benefit(s) of the alternative treatment(s) or procedure(s); and the risk(s) and benefit(s) of doing nothing. The patient was provided information about the general risks and possible complications associated with the procedure. These may include, but are not limited to: failure to achieve desired goals, infection, bleeding, organ or nerve damage, allergic reactions, paralysis, and death. In addition, the patient was informed of those risks and complications associated to  Spine-related procedures, such as failure to decrease pain; infection (i.e.: Meningitis, epidural or intraspinal abscess); bleeding (i.e.: epidural hematoma, subarachnoid hemorrhage, or any other type of intraspinal  or peri-dural bleeding); organ or nerve damage (i.e.: Any type of peripheral nerve, nerve root, or spinal cord injury) with subsequent damage to sensory, motor, and/or autonomic systems, resulting in permanent pain, numbness, and/or weakness of one or several areas of the body; allergic reactions; (i.e.: anaphylactic reaction); and/or death. Furthermore, the patient was informed of those risks and complications associated with the medications. These include, but are not limited to: allergic reactions (i.e.: anaphylactic or anaphylactoid reaction(s)); adrenal axis suppression; blood sugar elevation that in diabetics may result in ketoacidosis or comma; water retention that in patients with history of congestive heart failure may result in shortness of breath, pulmonary edema, and decompensation with resultant heart failure; weight gain; swelling or edema; medication-induced neural toxicity; particulate matter embolism and blood vessel occlusion with resultant organ, and/or nervous system infarction; and/or aseptic necrosis of one or more joints. Finally, the patient was informed that Medicine is not an exact science; therefore, there is also the possibility of unforeseen or unpredictable risks and/or possible complications that may result in a catastrophic outcome. The patient indicated having understood very clearly. We have given the patient no guarantees and we have made no promises. Enough time was given to the patient to ask questions, all of which were answered to the patient's satisfaction. Anita Gibbs has indicated that she wanted to continue with the procedure. Attestation: I, the ordering provider, attest that I have discussed with the patient the benefits, risks, side-effects, alternatives,  likelihood of achieving goals, and potential problems during recovery for the procedure that I have provided informed consent. Date   Time: 07/21/2019  9:46 AM  Pre-Procedure Preparation:  Monitoring: As per clinic protocol. Respiration, ETCO2, SpO2, BP, heart rate and rhythm monitor placed and checked for adequate function Safety Precautions: Patient was assessed for positional comfort and pressure points before starting the procedure. Time-out: I initiated and conducted the "Time-out" before starting the procedure, as per protocol. The patient was asked to participate by confirming the accuracy of the "Time Out" information. Verification of the correct person, site, and procedure were performed and confirmed by me, the nursing staff, and the patient. "Time-out" conducted as per Joint Commission's Universal Protocol (UP.01.01.01). Time: 1039  Description of Procedure:          Laterality: Bilateral. The procedure was performed in identical fashion on both sides. Levels:  L2, L3, L4, L5, & S1 Medial Branch Level(s) Area Prepped: Posterior Lumbosacral Region Prepping solution: DuraPrep (Iodine Povacrylex [0.7% available iodine] and Isopropyl Alcohol, 74% w/w) Safety Precautions: Aspiration looking for blood return was conducted prior to all injections. At no point did we inject any substances, as a needle was being advanced. Before injecting, the patient was told to immediately notify me if she was experiencing any new onset of "ringing in the ears, or metallic taste in the mouth". No attempts were made at seeking any paresthesias. Safe injection practices and needle disposal techniques used. Medications properly checked for expiration dates. SDV (single dose vial) medications used. After the completion of the procedure, all disposable equipment used was discarded in the proper designated medical waste containers. Local Anesthesia: Protocol guidelines were followed. The patient was positioned over the  fluoroscopy table. The area was prepped in the usual manner. The time-out was completed. The target area was identified using fluoroscopy. A 12-in long, straight, sterile hemostat was used with fluoroscopic guidance to locate the targets for each level blocked. Once located, the skin was marked with an approved surgical skin marker. Once all sites  were marked, the skin (epidermis, dermis, and hypodermis), as well as deeper tissues (fat, connective tissue and muscle) were infiltrated with a small amount of a short-acting local anesthetic, loaded on a 10cc syringe with a 25G, 1.5-in  Needle. An appropriate amount of time was allowed for local anesthetics to take effect before proceeding to the next step. Local Anesthetic: Lidocaine 2.0% The unused portion of the local anesthetic was discarded in the proper designated containers. Technical explanation of process:  L2 Medial Branch Nerve Block (MBB): The target area for the L2 medial branch is at the junction of the postero-lateral aspect of the superior articular process and the superior, posterior, and medial edge of the transverse process of L3. Under fluoroscopic guidance, a Quincke needle was inserted until contact was made with os over the superior postero-lateral aspect of the pedicular shadow (target area). After negative aspiration for blood, 0.5 mL of the nerve block solution was injected without difficulty or complication. The needle was removed intact. L3 Medial Branch Nerve Block (MBB): The target area for the L3 medial branch is at the junction of the postero-lateral aspect of the superior articular process and the superior, posterior, and medial edge of the transverse process of L4. Under fluoroscopic guidance, a Quincke needle was inserted until contact was made with os over the superior postero-lateral aspect of the pedicular shadow (target area). After negative aspiration for blood, 0.5 mL of the nerve block solution was injected without difficulty  or complication. The needle was removed intact. L4 Medial Branch Nerve Block (MBB): The target area for the L4 medial branch is at the junction of the postero-lateral aspect of the superior articular process and the superior, posterior, and medial edge of the transverse process of L5. Under fluoroscopic guidance, a Quincke needle was inserted until contact was made with os over the superior postero-lateral aspect of the pedicular shadow (target area). After negative aspiration for blood, 0.5 mL of the nerve block solution was injected without difficulty or complication. The needle was removed intact. L5 Medial Branch Nerve Block (MBB): The target area for the L5 medial branch is at the junction of the postero-lateral aspect of the superior articular process and the superior, posterior, and medial edge of the sacral ala. Under fluoroscopic guidance, a Quincke needle was inserted until contact was made with os over the superior postero-lateral aspect of the pedicular shadow (target area). After negative aspiration for blood, 0.5 mL of the nerve block solution was injected without difficulty or complication. The needle was removed intact. S1 Medial Branch Nerve Block (MBB): The target area for the S1 medial branch is at the posterior and inferior 6 o'clock position of the L5-S1 facet joint. Under fluoroscopic guidance, the Quincke needle inserted for the L5 MBB was redirected until contact was made with os over the inferior and postero aspect of the sacrum, at the 6 o' clock position under the L5-S1 facet joint (Target area). After negative aspiration for blood, 0.5 mL of the nerve block solution was injected without difficulty or complication. The needle was removed intact.  Nerve block solution: 0.2% PF-Ropivacaine + Triamcinolone (40 mg/mL) diluted to a final concentration of 4 mg of Triamcinolone/mL of Ropivacaine The unused portion of the solution was discarded in the proper designated containers. Procedural  Needles: 22-gauge, 5-inch, Quincke needles used for all levels.  Once the entire procedure was completed, the treated area was cleaned, making sure to leave some of the prepping solution back to take advantage of its long term  bactericidal properties.   Illustration of the posterior view of the lumbar spine and the posterior neural structures. Laminae of L2 through S1 are labeled. DPRL5, dorsal primary ramus of L5; DPRS1, dorsal primary ramus of S1; DPR3, dorsal primary ramus of L3; FJ, facet (zygapophyseal) joint L3-L4; I, inferior articular process of L4; LB1, lateral branch of dorsal primary ramus of L1; IAB, inferior articular branches from L3 medial branch (supplies L4-L5 facet joint); IBP, intermediate branch plexus; MB3, medial branch of dorsal primary ramus of L3; NR3, third lumbar nerve root; S, superior articular process of L5; SAB, superior articular branches from L4 (supplies L4-5 facet joint also); TP3, transverse process of L3.  Vitals:   07/21/19 1052 07/21/19 1101 07/21/19 1111 07/21/19 1122  BP: 112/78 134/63 135/64 139/68  Pulse:      Resp: 17 18 18 16   Temp:  97.6 F (36.4 C)    TempSrc:      SpO2: 94% 97% 100% 100%  Weight:      Height:         Start Time: 1039 hrs. End Time: 1052 hrs.  Imaging Guidance (Spinal):          Type of Imaging Technique: Fluoroscopy Guidance (Spinal) Indication(s): Assistance in needle guidance and placement for procedures requiring needle placement in or near specific anatomical locations not easily accessible without such assistance. Exposure Time: Please see nurses notes. Contrast: None used. Fluoroscopic Guidance: I was personally present during the use of fluoroscopy. "Tunnel Vision Technique" used to obtain the best possible view of the target area. Parallax error corrected before commencing the procedure. "Direction-depth-direction" technique used to introduce the needle under continuous pulsed fluoroscopy. Once target was reached,  antero-posterior, oblique, and lateral fluoroscopic projection used confirm needle placement in all planes. Images permanently stored in EMR. Interpretation: No contrast injected. I personally interpreted the imaging intraoperatively. Adequate needle placement confirmed in multiple planes. Permanent images saved into the patient's record.  Antibiotic Prophylaxis:   Anti-infectives (From admission, onward)   None     Indication(s): None identified  Post-operative Assessment:  Post-procedure Vital Signs:  Pulse/HCG Rate: 9062 Temp: 97.6 F (36.4 C) Resp: 16 BP: 139/68 SpO2: 100 %  EBL: None  Complications: No immediate post-treatment complications observed by team, or reported by patient.  Note: The patient tolerated the entire procedure well. A repeat set of vitals were taken after the procedure and the patient was kept under observation following institutional policy, for this type of procedure. Post-procedural neurological assessment was performed, showing return to baseline, prior to discharge. The patient was provided with post-procedure discharge instructions, including a section on how to identify potential problems. Should any problems arise concerning this procedure, the patient was given instructions to immediately contact us, at any time, without hesitation. In any case, we plan to contact the patient by telephone for a follow-up status report regarding this interventional procedure.  Comments:  No additional relevant information.  Plan of Care  Orders:  Orders Placed This Encounter  Procedures   L-FCT Blk (Today)    Scheduling Instructions:     Procedure: Lumbar facet block (AKA.: Lumbosacral medial branch nerve block)     Side: Bilateral     Level: L3-4, L4-5, & L5-S1 Facets (L2, L3, L4, L5, & S1 Medial Branch Nerves)     Sedation: Patient's choice.     Timeframe: Today    Order Specific Question:   Where will this procedure be performed?    Answer:   [facility]   Fluoro (C-Arm) (<60 min) (No Report)    Intraoperative interpretation by procedural physician at [place].    Standing Status:   Standing    Number of Occurrences:   1    Order Specific Question:   Reason for exam:    Answer:   Assistance in needle guidance and placement for procedures requiring needle placement in or near specific anatomical locations not easily accessible without such assistance.   Consent: L-FCT BLK    Nursing Order: Transcribe to consent form and obtain patient signature. Note: Always confirm laterality of pain with Anita Gibbs, before procedure. Procedure: Lumbar Facet Block  under fluoroscopic guidance Indication/Reason: Low Back Pain, with our without leg pain, due to Facet Joint Arthralgia (Joint Pain) known as Lumbar Facet Syndrome, secondary to Lumbar, and/or Lumbosacral Spondylosis (Arthritis of the Spine), without myelopathy or radiculopathy (Nerve Damage). Provider Attestation: I, [place] Dossie Arbour, MD, (Pain Management Specialist), the physician/practitioner, attest that I have discussed with the patient the benefits, risks, side effects, alternatives, likelihood of achieving goals and potential problems during recovery for the procedure that I have provided informed consent.   Block Tray    Equipment required: Single use, disposable, "Block Tray"    Standing Status:   Standing    Number of Occurrences:   1    Order Specific Question:   Specify    Answer:   Block Tray   Allergy: IODINE / Shellfish    NOTE: Although It is true that patients can have allergies to shellfish and that shellfish contain iodine, most shellfish  allergies are due to two protein allergens present in the shellfish: tropomyosins and parvalbumin. Not all patients with shellfish allergies are allergic to iodine. However, as a precaution, avoid using iodine containing products.    Standing Status:   Standing    Number of Occurrences:   1   Chronic Opioid  Analgesic:  Oxycodone IR 5 mg, 1 tab PO QD (5 mg/day of oxycodone) (has enough to last until 11/28/2019) (please see the 04/20/2019 note.) MME/day:7.5mg /day.   Medications ordered for procedure: Meds ordered this encounter  Medications   lidocaine (XYLOCAINE) 2 % (with pres) injection 400 mg   lactated ringers infusion 1,000 mL   midazolam (VERSED) 5 MG/5ML injection 1-2 mg    Make sure Flumazenil is available in the pyxis when using this medication. If oversedation occurs, administer 0.2 mg IV over 15 sec. If after 45 sec no response, administer 0.2 mg again over 1 min; may repeat at 1 min intervals; not to exceed 4 doses (1 mg)   fentaNYL (SUBLIMAZE) injection 25-50 mcg    Make sure Narcan is available in the pyxis when using this medication. In the event of respiratory depression (RR< 8/min): Titrate NARCAN (naloxone) in increments of 0.1 to 0.2 mg IV at 2-3 minute intervals, until desired degree of reversal.   ropivacaine (PF) 2 mg/mL (0.2%) (NAROPIN) injection 18 mL   triamcinolone acetonide (KENALOG-40) injection 80 mg   Medications administered: We administered lidocaine, lactated ringers, midazolam, fentaNYL, ropivacaine (PF) 2 mg/mL (0.2%), and triamcinolone acetonide.  See the medical record for exact dosing, route, and time of administration.  Follow-up plan:   Return in about 2 weeks (around 08/04/2019) for (VV), (PP).       Interventional management options:  Considering:   NOTE: NO RFA until BMI is <35.  Possible bilateral cervical facetRFA Diagnostic right L3-4 LESI Diagnostic bilateral L3 vs L4 TFESI  Possible bilateral lumbar facetRFA  Diagnostic right vs bilateral SI joint injection Possible right vs bilateral SI jointRFA   Palliative PRN treatment(s):   Diagnostic/therapeutic bilateral lumbar facet block #2  Palliative right CESI #4  Diagnostic bilateral cervicalfacet block #2     Recent Visits Date Type Provider Dept  06/13/19 Office Visit  Milinda Pointer, MD [facility]  05/19/19 Procedure visit Milinda Pointer, MD [facility]  Showing recent visits within past 90 days and meeting all other requirements   Today's Visits Date Type Provider Dept  07/21/19 Procedure visit Milinda Pointer, MD [facility]  Showing today's visits and meeting all other requirements   Future Appointments Date Type Provider Dept  08/10/19 Appointment Milinda Pointer, MD [facility]  09/07/19 Appointment Milinda Pointer, MD [facility]  Showing future appointments within next 90 days and meeting all other requirements   Disposition: Discharge home  Discharge Date & Time: 07/21/2019; 1123 hrs.   Primary Care Physician: Jodelle Green, FNP Location: [facility] Note by: Gaspar Cola, MD Date: 07/21/2019; Time: 12:02 PM  Disclaimer:  Medicine is not an Chief Strategy Officer. The only guarantee in medicine is that nothing is guaranteed. It is important to note that the decision to proceed with this intervention was based on the information collected from the patient. The Data and conclusions were drawn from the patient's questionnaire, the interview, and the physical examination. Because the information was provided in large part by the patient, it cannot be guaranteed that it has not been purposely or unconsciously manipulated. Every effort has been made to obtain as much relevant data as possible for this evaluation. It is important to note that the conclusions that lead to this procedure are derived in large part from the available data. Always take into account that the treatment will also be dependent on availability of resources and existing treatment guidelines, considered by other Pain Management Practitioners as being common knowledge and practice, at the time of the intervention. For Medico-Legal purposes, it is also important to point out that variation  in procedural techniques and pharmacological choices are the acceptable norm. The indications, contraindications, technique, and results of the above procedure should only be interpreted and judged by a Board-Certified Interventional Pain Specialist with extensive familiarity and expertise in the same exact procedure and technique.

## 2019-07-21 NOTE — Progress Notes (Signed)
Safety precautions to be maintained throughout the outpatient stay will include: orient to surroundings, keep bed in low position, maintain call bell within reach at all times, provide assistance with transfer out of bed and ambulation.  

## 2019-07-22 ENCOUNTER — Telehealth: Payer: Self-pay

## 2019-07-22 NOTE — Telephone Encounter (Signed)
Post procedure phone call.  Patient states she is doing well.  

## 2019-08-08 ENCOUNTER — Encounter: Payer: Self-pay | Admitting: Internal Medicine

## 2019-08-08 ENCOUNTER — Ambulatory Visit (INDEPENDENT_AMBULATORY_CARE_PROVIDER_SITE_OTHER): Payer: BC Managed Care – PPO | Admitting: Internal Medicine

## 2019-08-08 ENCOUNTER — Other Ambulatory Visit: Payer: Self-pay

## 2019-08-08 DIAGNOSIS — R21 Rash and other nonspecific skin eruption: Secondary | ICD-10-CM | POA: Insufficient documentation

## 2019-08-08 HISTORY — DX: Rash and other nonspecific skin eruption: R21

## 2019-08-08 MED ORDER — METHYLPREDNISOLONE 4 MG PO TBPK: ORAL_TABLET | ORAL | 0 refills | Status: DC

## 2019-08-08 NOTE — Progress Notes (Addendum)
Patient ID: Anita Gibbs, female   DOB: 1970-09-03, 48 y.o.   MRN: [id number]   Virtual Visit via video Note  This visit type was conducted due to national recommendations for restrictions regarding the COVID-19 pandemic (e.g. social distancing).  This format is felt to be most appropriate for this patient at this time.  All issues noted in this document were discussed and addressed.  No physical exam was performed (except for noted visual exam findings with Video Visits).   I connected with Garald Braver by a video enabled telemedicine application and verified that I am speaking with the correct person using two identifiers. Location patient: home Location provider: work  Persons participating in the virtual visit: patient, provider  I discussed the limitations, risks, security and privacy concerns of performing an evaluation and management service by video and the availability of in person appointments.  The patient expressed understanding and agreed to proceed.   Reason for visit: acute visit.  HPI: Former pt of [employer].  She has been diagnosed with alpha gal allergy.  States she ate an increased amount of red meat this past week.  Noticed two days ago, she developed a rash (described as whelps on her neck, back, breast and legs - down to her feet). She is on zyrtec daily.  Has been taking scheduled benadryl.  Still with increased itching and rash.  No lip or tongue swelling.  No fever.  No headache.  No swallowing problems and no feeling like her throat is closing.  No cough, congestin or sob).  No nausea, vomiting or diarrhea.  Has taken steroids for similar flare and tolerated.  Denies diabetes or any acid reflux or history of ulcers.      ROS: See pertinent positives and negatives per HPI.  Past Medical History:  Diagnosis Date  . Arthritis   . Carpal boss, right   . Carpal tunnel syndrome, bilateral   . Headache   . Hypertension   . Seasonal asthma 11/29/2018    Past  Surgical History:  Procedure Laterality Date  . ANTERIOR CERVICAL DECOMP/DISCECTOMY FUSION N/A 07/01/2017   Procedure: ANTERIOR CERVICAL DECOMPRESSION/DISCECTOMY FUSION 3 LEVELS;  Surgeon: Meade Maw, MD;  Location: [facility];  Service: Neurosurgery;  Laterality: N/A;  . KNEE ARTHROSCOPY Right     Family History  Problem Relation Age of Onset  . [name] Mother   . Diabetes Mother   . Arthritis Mother   . Hypertension Mother   . Hyperlipidemia Mother   . Hypertension Father   . Arthritis Father   . Hearing loss Father   . Kidney disease Father   . Arthritis Sister   . Hypertension Sister   . Cancer Brother   . COPD Brother   . Early death Brother   . COPD Son   . Depression Son   . Breast cancer Maternal Aunt   . Breast cancer Paternal Aunt   . Allergic rhinitis Neg Hx   . Angioedema Neg Hx   . Eczema Neg Hx   . Urticaria Neg Hx   . Asthma Neg Hx     SOCIAL HX: reviewed.    Current Outpatient Medications:  .  albuterol (PROVENTIL HFA;VENTOLIN HFA) 108 (90 Base) MCG/ACT inhaler, Inhale 2 puffs into the lungs every 6 (six) hours as needed for wheezing or shortness of breath., Disp: 1 Inhaler, Rfl: 5 .  amLODipine (NORVASC) 5 MG tablet, Take 1 tablet (5 mg total) by mouth daily., Disp: 90 tablet, Rfl: 1 .  Cetirizine HCl (ZYRTEC PO), Take 25 mg by mouth., Disp: , Rfl:  .  diphenhydrAMINE (BENADRYL) 25 mg capsule, Take 25 mg by mouth every 6 (six) hours as needed (for allergies.)., Disp: , Rfl:  .  EPINEPHrine (AUVI-Q) 0.3 mg/0.3 mL IJ SOAJ injection, Use as directed for severe allergic reaction, Disp: 2 Device, Rfl: 2 .  famotidine (PEPCID) 20 MG tablet, Take 1 tablet (20 mg total) by mouth 2 (two) times daily., Disp: 60 tablet, Rfl: 5 .  fluticasone (FLONASE) 50 MCG/ACT nasal spray, Place 2 sprays into both nostrils daily., Disp: 16 g, Rfl: 6 .  gabapentin (NEURONTIN) 300 MG capsule, Take 300 mg by mouth 3 (three) times daily., Disp: , Rfl:  .  losartan (COZAAR) 100  MG tablet, Take 1 tablet (100 mg total) by mouth daily., Disp: 90 tablet, Rfl: 3 .  methocarbamol (ROBAXIN) 750 MG tablet, Take 1 tablet (750 mg total) by mouth every 8 (eight) hours as needed for muscle spasms., Disp: 90 tablet, Rfl: 3 .  methylPREDNISolone (MEDROL DOSEPAK) 4 MG TBPK tablet, Medrol dosepack 6 day taper.  Take as directed., Disp: 21 tablet, Rfl: 0 .  montelukast (SINGULAIR) 10 MG tablet, Take 1 tablet (10 mg total) by mouth at bedtime., Disp: 30 tablet, Rfl: 5 .  norethindrone (AYGESTIN) 5 MG tablet, , Disp: , Rfl:  .  oxyCODONE (OXY IR/ROXICODONE) 5 MG immediate release tablet, Take 1 tablet (5 mg total) by mouth daily as needed for severe pain. Must last 30 days, Disp: 30 tablet, Rfl: 0 .  oxyCODONE (OXY IR/ROXICODONE) 5 MG immediate release tablet, Take 1 tablet (5 mg total) by mouth daily as needed for severe pain. Must last 30 days, Disp: 30 tablet, Rfl: 0 .  [START ON 08/12/2019] oxyCODONE (OXY IR/ROXICODONE) 5 MG immediate release tablet, Take 1 tablet (5 mg total) by mouth daily as needed for severe pain. Must last 30 days, Disp: 30 tablet, Rfl: 0  EXAM:  GENERAL: alert, oriented, appears well and in no acute distress  HEENT: atraumatic, conjunttiva clear, no obvious abnormalities on inspection of external nose and ears  NECK: normal movements of the head and neck  LUNGS: on inspection no signs of respiratory distress, breathing rate appears normal, no obvious gross SOB, gasping or wheezing  CV: no obvious cyanosis  SKIN:  Round erythematous rash - top of her breast.  (also has on neck, back and legs, but I did not visualize these areas)  PSYCH/NEURO: pleasant and cooperative, no obvious depression or anxiety, speech and thought processing grossly intact  ASSESSMENT AND PLAN:  Discussed the following assessment and plan:  Rash Has alpha gal allergy. Ate increased red meat over this past week.  She is taking zyrtec daily and has started scheduled benadryl.  Some  better. She is still having increased rash and itching.  No lip or tongue swelling and no difficulty breathing.  Has taken steroids previously and tolerated.  Medrol dose pack 6 day taper.  Discussed possible side effects of medication.  Follow.  Call with update/problems.      I discussed the assessment and treatment plan with the patient. The patient was provided an opportunity to ask questions and all were answered. The patient agreed with the plan and demonstrated an understanding of the instructions.   The patient was advised to call back or seek an in-person evaluation if the symptoms worsen or if the condition fails to improve as anticipated.   Einar Pheasant, MD

## 2019-08-08 NOTE — Assessment & Plan Note (Signed)
Has alpha gal allergy. Ate increased red meat over this past week.  She is taking zyrtec daily and has started scheduled benadryl.  Some better. She is still having increased rash and itching.  No lip or tongue swelling and no difficulty breathing.  Has taken steroids previously and tolerated.  Medrol dose pack 6 day taper.  Discussed possible side effects of medication.  Follow.  Call with update/problems.

## 2019-08-08 NOTE — Addendum Note (Signed)
Addended by: Alisa Graff on: 08/08/2019 09:02 AM   Modules accepted: Orders

## 2019-08-09 ENCOUNTER — Encounter: Payer: Self-pay | Admitting: Pain Medicine

## 2019-08-09 NOTE — Progress Notes (Signed)
Pain Management Virtual Encounter Note - Virtual Visit via Telephone Telehealth (real-time audio visits between healthcare provider and patient).   Patient's Phone No. & Preferred Pharmacy:  [phone] (home); [phone] (mobile); (Preferred) [phone] [email or web address]  [place], [place] - [address] [place] [address] Phone: [phone] Fax: [phone]    Pre-screening note:  Our staff contacted Anita Gibbs and offered her an "in person", "face-to-face" appointment versus a telephone encounter. She indicated preferring the telephone encounter, at this time.   Reason for Virtual Visit: COVID-19*  Social distancing based on CDC and AMA recommendations.   I contacted Anita Gibbs on 08/10/2019 via telephone.      I clearly identified myself as Gaspar Cola, MD. I verified that I was speaking with the correct person using two identifiers (Name: Anita Gibbs, and date of birth: April 10, 1971).  Advanced Informed Consent I sought verbal advanced consent from Anita Gibbs for virtual visit interactions. I informed Anita Gibbs of possible security and privacy concerns, risks, and limitations associated with providing "not-in-person" medical evaluation and management services. I also informed Anita Gibbs of the availability of "in-person" appointments. Finally, I informed her that there would be a charge for the virtual visit and that she could be  personally, fully or partially, financially responsible for it. Anita Gibbs expressed understanding and agreed to proceed.   Historic Elements   Anita Gibbs is a 48 y.o. year old, female patient evaluated today after her last encounter by our practice on 07/22/2019. Anita Gibbs  has a past medical history of Arthritis, Carpal boss, right, Carpal tunnel syndrome, bilateral, Headache, Hypertension, and Seasonal asthma (11/29/2018). She also  has a past surgical history that  includes Knee arthroscopy (Right) and Anterior cervical decomp/discectomy fusion (N/A, 07/01/2017). Anita Gibbs has a current medication list which includes the following prescription(s): albuterol, amlodipine, cetirizine hcl, diphenhydramine, epinephrine, famotidine, fluticasone, gabapentin, losartan, methocarbamol, methylprednisolone, montelukast, norethindrone, oxycodone, oxycodone, oxycodone, and oxycodone. She  reports that she has never smoked. She has never used smokeless tobacco. She reports current alcohol use. She reports that she does not use drugs. Anita Gibbs is allergic to other; shellfish allergy; strawberry (diagnostic); and tomato.   HPI  Today, she is being contacted for a post-procedure assessment.  The patient indicates having attained excellent relief of the pain with the diagnostic bilateral lumbar facet block #2.  She is currently not experiencing any pain in the lumbar region.  Today we talked about the alternatives including the possibility of radiofrequency ablation.  I reminded the patient to minimize lifting and turning at the waist level, so asked to minimize aggravating the back pain.  I also recommended that she bring her weight down to a BMI of 30 or less, also to help with the pain and to increase the possibility of success if we need to proceed with radiofrequency ablation in the future.  Today the patient asked me to write for her gabapentin and I reminded her that the gabapentin need to take it on a regular basis and it takes 2 to 3 weeks for levels to rise the body so as to be effective.  She has been taking 300 mg p.o. 3 times daily for a while and refers that she feels that she could take more.  She denies any type of side effects to the medication.  Today I will increase the dose to 300 to 600 mg p.o. 3 times daily.  I have  given the patient explicit instructions on how to slowly increase the dose by 1 additional tablet every 2 to 3 weeks.  She understood and  accepted.  In addition, the patient today asked me about applying for disability due to her low back pain and neck pain.  I told her that I cannot give her any legal advice regarding that.  I also told her that being realistic, it usually it is very difficult to get disability for the type of condition that she has, which is arthritis of the lumbar facet joint.  Post-Procedure Evaluation  Procedure: Diagnostic bilateral lumbar facet block #2 under fluoroscopic guidance and IV sedation Pre-procedure pain level:  8/10 Post-procedure: 0/10 (100% relief)  Sedation: Sedation provided.  Rise Patience, RN  08/09/2019  2:26 PM  Sign when Signing Visit Pain relief after procedure (treated area only): (Questions asked to patient) 1. Starting about 15 minutes after the procedure, and "while the area was still numb" (from the local anesthetics), were you having any of your usual pain "in that area" (the treated area)?  (NOTE: NOT including the discomfort from the needle sticks.) First 1 hour: 100 % better. First 4-6 hours: 100 % better. 2. Assuming that it did get numb. How long was the area numb? 100 % benefit, longer than 6 hours. How long? 8 hours. 3. How much better is your pain now, when compared to before the procedure? Current benefit: 100 % better. 4. Can you move better now? Improvement in ROM (Range of Motion): Yes. 5. Can you do more now? Improvement in function: Yes. 4. Did you have any problems with the procedure? Side-effects/Complications: No.   Current benefits: Defined as benefit that persist at this time.   Analgesia:  90-100% better Function: Anita Gibbs reports improvement in function ROM: Anita Gibbs reports improvement in ROM   Pharmacotherapy Assessment  Analgesic: Oxycodone IR 5 mg, 1 tab PO QD (5 mg/day of oxycodone) (has enough to last until 11/28/2019) (please see the 04/20/2019 note.) MME/day:7.5mg /day.   Monitoring: Pharmacotherapy: No side-effects or adverse  reactions reported. [place] PMP: PDMP reviewed during this encounter.       Compliance: No problems identified. Effectiveness: Clinically acceptable. Plan: Refer to "POC".  UDS:  Summary  Date Value Ref Range Status  09/07/2018 FINAL  Final    Comment:    ==================================================================== [facility] COMP DRUG ANALYSIS,UR ==================================================================== Test                             Result       Flag       Units Drug Present and Declared for Prescription Verification   Oxycodone                      220          EXPECTED   ng/mg creat   Oxymorphone                    65           EXPECTED   ng/mg creat   Noroxycodone                   363          EXPECTED   ng/mg creat    Sources of oxycodone include scheduled prescription medications.    Oxymorphone and noroxycodone are expected metabolites of    oxycodone. Oxymorphone is also available as  a scheduled    prescription medication.   Gabapentin                     PRESENT      EXPECTED   Methocarbamol                  PRESENT      EXPECTED   Diphenhydramine                PRESENT      EXPECTED Drug Absent but Declared for Prescription Verification   Acetaminophen                  Not Detected UNEXPECTED    Acetaminophen, as indicated in the declared medication list, is    not always detected even when used as directed.   Salicylate                     Not Detected UNEXPECTED    Aspirin, as indicated in the declared medication list, is not    always detected even when used as directed.   Naproxen                       Not Detected UNEXPECTED ==================================================================== Test                      Result    Flag   Units      Ref Range   Creatinine              207              mg/dL      >=20 ==================================================================== Declared Medications:  The flagging and interpretation on this  report are based on the  following declared medications.  Unexpected results may arise from  inaccuracies in the declared medications.  **Note: The testing scope of this panel includes these medications:  Diphenhydramine (Benadryl)  Gabapentin  Methocarbamol (Robaxin)  Naproxen (Naprosyn)  Oxycodone  **Note: The testing scope of this panel does not include small to  moderate amounts of these reported medications:  Acetaminophen (Excedrin)  Aspirin (Excedrin)  **Note: The testing scope of this panel does not include following  reported medications:  Albuterol  Caffeine (Excedrin)  Epinephrine (EpiPen)  Famotidine (Pepcid)  Losartan (Cozaar)  Metronidazole (Flagyl) ==================================================================== For clinical consultation, please call [phone]. ====================================================================    Laboratory Chemistry Profile (12 mo)  Renal: 09/07/2018: BUN/Creatinine Ratio 13 09/23/2018: BUN 11; Creatinine, Ser 0.89  Lab Results  Component Value Date   GFR 82.91 09/23/2018   GFRAA 76 09/07/2018   GFRNONAA 66 09/07/2018   Hepatic: 09/07/2018: Albumin 4.3 Lab Results  Component Value Date   AST 10 09/07/2018   ALT 10 08/03/2018   Other: 09/07/2018: 25-Hydroxy, Vitamin D 8.1; 25-Hydroxy, Vitamin D-2 <1.0; 25-Hydroxy, Vitamin D-3 8.1; CRP 7; Sed Rate 67; Vitamin B-12 601 Note: Above Lab results reviewed.  Imaging  Fluoro (C-Arm) (<60 min) (No Report) Fluoro was used, but no Radiologist interpretation will be provided.  Please refer to "NOTES" tab for provider progress note.   Assessment  The primary encounter diagnosis was Chronic low back pain [facility] Area of Pain) (Bilateral) (R>L) w/o sciatica. Diagnoses of Lumbar facet syndrome (Bilateral) (R>L), Chronic pain syndrome, Chronic musculoskeletal pain, and Neurogenic pain were also pertinent to this visit.  Plan of Care  Problem-specific:  No problem-specific  Assessment & Plan notes found for this encounter.  I have changed Olena C. Hillis's gabapentin. I am also having her start on oxyCODONE and oxyCODONE. Additionally, I am having her maintain her diphenhydrAMINE, EPINEPHrine, losartan, fluticasone, albuterol, amLODipine, Cetirizine HCl (ZYRTEC PO), norethindrone, montelukast, famotidine, oxyCODONE, methylPREDNISolone, oxyCODONE, and methocarbamol.  Pharmacotherapy (Medications Ordered): Meds ordered this encounter  Medications  . oxyCODONE (OXY IR/ROXICODONE) 5 MG immediate release tablet    Sig: Take 1 tablet (5 mg total) by mouth daily as needed for severe pain. Must last 30 days    Dispense:  30 tablet    Refill:  0    Chronic Pain: STOP Act (Not applicable) Fill 1 day early if closed on refill date. Do not fill until: 09/11/2019. To last until: 10/11/2019. Avoid benzodiazepines within 8 hours of opioids  . oxyCODONE (OXY IR/ROXICODONE) 5 MG immediate release tablet    Sig: Take 1 tablet (5 mg total) by mouth daily as needed for severe pain. Must last 30 days    Dispense:  30 tablet    Refill:  0    Chronic Pain: STOP Act (Not applicable) Fill 1 day early if closed on refill date. Do not fill until: 10/11/2019. To last until: 11/10/2019. Avoid benzodiazepines within 8 hours of opioids  . oxyCODONE (OXY IR/ROXICODONE) 5 MG immediate release tablet    Sig: Take 1 tablet (5 mg total) by mouth daily as needed for severe pain. Must last 30 days    Dispense:  30 tablet    Refill:  0    Chronic Pain: STOP Act (Not applicable) Fill 1 day early if closed on refill date. Do not fill until: 11/10/2019. To last until: 12/10/2019. Avoid benzodiazepines within 8 hours of opioids  . methocarbamol (ROBAXIN) 750 MG tablet    Sig: Take 1 tablet (750 mg total) by mouth every 8 (eight) hours as needed for muscle spasms.    Dispense:  90 tablet    Refill:  5    Fill one day early if pharmacy is closed on scheduled refill date. May substitute for generic if available.   . gabapentin (NEURONTIN) 300 MG capsule    Sig: Take 1-2 capsules (300-600 mg total) by mouth 3 (three) times daily.    Dispense:  180 capsule    Refill:  2    Fill one day early if pharmacy is closed on scheduled refill date. May substitute for generic if available.   Orders:  Orders Placed This Encounter  Procedures  . L-FCT Blk (PRN)    Scheduling timeframe: (PRN procedure) Ms. Everson will call when needed. Clinical indication: Axial low back pain. 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  Sedation: Usually done with sedation. (May be done without sedation if so desired by patient.) Requirements: NPO x 8 hrs.; Driver; Stop blood thinners. Interval: No sooner than two weeks for diagnostic or therapeutic. No sooner than every other month for palliative.    Standing Status:   Standing    Number of Occurrences:   5    Standing Expiration Date:   08/09/2020    Scheduling Instructions:     Procedure: Lumbar facet block (AKA.: Lumbosacral medial branch nerve block)     Level: L3-4, L4-5, & L5-S1 Facets (L2, L3, L4, L5, & S1 Medial Branch Nerves)     Laterality: Bilateral    Order Specific Question:   Where will this procedure be performed?    Answer:   [facility]   Follow-up plan:   Return in about 17 weeks (around 12/07/2019) for (VV), (MM), in addition,  PRN Procedure(s): (B) L-FCT BLK #3.      Interventional management options:  Considering:   NOTE: NO RFA until BMI is <35. Possible bilateral cervical facetRFA Diagnostic right L3-4 LESI Diagnostic bilateral L3 vs L4 TFESI  Possible bilateral lumbar facetRFA Diagnostic right vs bilateral SI joint injection Possible right vs bilateral SI jointRFA   Palliative PRN treatment(s):   Diagnostic/therapeutic bilateral lumbar facet block #2  Palliative right CESI #4  Diagnostic bilateral cervicalfacet block #2    Recent Visits Date Type Provider Dept  07/21/19 Procedure visit Milinda Pointer, MD [facility]   06/13/19 Office Visit Milinda Pointer, MD [facility]  05/19/19 Procedure visit Milinda Pointer, MD [facility]  Showing recent visits within past 90 days and meeting all other requirements   Today's Visits Date Type Provider Dept  08/10/19 Telemedicine Milinda Pointer, MD [facility]  Showing today's visits and meeting all other requirements   Future Appointments Date Type Provider Dept  09/07/19 Appointment Milinda Pointer, MD [facility]  Showing future appointments within next 90 days and meeting all other requirements   I discussed the assessment and treatment plan with the patient. The patient was provided an opportunity to ask questions and all were answered. The patient agreed with the plan and demonstrated an understanding of the instructions.  Patient advised to call back or seek an in-person evaluation if the symptoms or condition worsens.  Total duration of non-face-to-face encounter: 25 minutes.  Note by: Gaspar Cola, MD Date: 08/10/2019; Time: 11:56 AM  Note: This dictation was prepared with Dragon dictation. Any transcriptional errors that may result from this process are unintentional.  Disclaimer:  * Given the special circumstances of the COVID-19 pandemic, the federal government has announced that the Office for Civil Rights (OCR) will exercise its enforcement discretion and will not impose penalties on physicians using telehealth in the event of noncompliance with regulatory requirements under the [place] (HIPAA) in connection with the good faith provision of telehealth during the XX123456 national public health emergency. ([place])

## 2019-08-09 NOTE — Progress Notes (Signed)
Pain relief after procedure (treated area only): (Questions asked to patient) 1. Starting about 15 minutes after the procedure, and "while the area was still numb" (from the local anesthetics), were you having any of your usual pain "in that area" (the treated area)?  (NOTE: NOT including the discomfort from the needle sticks.) First 1 hour: 100 % better. First 4-6 hours: 100 % better. 2. Assuming that it did get numb. How long was the area numb? 100 % benefit, longer than 6 hours. How long? 8 hours. 3. How much better is your pain now, when compared to before the procedure? Current benefit: 100 % better. 4. Can you move better now? Improvement in ROM (Range of Motion): Yes. 5. Can you do more now? Improvement in function: Yes. 4. Did you have any problems with the procedure? Side-effects/Complications: No.

## 2019-08-10 ENCOUNTER — Ambulatory Visit: Payer: BC Managed Care – PPO | Attending: Pain Medicine | Admitting: Pain Medicine

## 2019-08-10 ENCOUNTER — Other Ambulatory Visit: Payer: Self-pay

## 2019-08-10 DIAGNOSIS — M7918 Myalgia, other site: Secondary | ICD-10-CM | POA: Diagnosis not present

## 2019-08-10 DIAGNOSIS — M47816 Spondylosis without myelopathy or radiculopathy, lumbar region: Secondary | ICD-10-CM | POA: Diagnosis not present

## 2019-08-10 DIAGNOSIS — M545 Low back pain, unspecified: Secondary | ICD-10-CM

## 2019-08-10 DIAGNOSIS — G894 Chronic pain syndrome: Secondary | ICD-10-CM | POA: Diagnosis not present

## 2019-08-10 DIAGNOSIS — M792 Neuralgia and neuritis, unspecified: Secondary | ICD-10-CM

## 2019-08-10 DIAGNOSIS — G8929 Other chronic pain: Secondary | ICD-10-CM

## 2019-08-10 MED ORDER — METHOCARBAMOL 750 MG PO TABS: 750.0000 mg | ORAL_TABLET | Freq: Three times a day (TID) | ORAL | 5 refills | Status: DC | PRN

## 2019-08-10 MED ORDER — OXYCODONE HCL 5 MG PO TABS: 5.0000 mg | ORAL_TABLET | Freq: Every day | ORAL | 0 refills | Status: DC | PRN

## 2019-08-10 MED ORDER — GABAPENTIN 300 MG PO CAPS: 300.0000 mg | ORAL_CAPSULE | Freq: Three times a day (TID) | ORAL | 2 refills | Status: DC

## 2019-08-10 NOTE — Patient Instructions (Signed)

## 2019-08-12 ENCOUNTER — Other Ambulatory Visit: Payer: Self-pay

## 2019-08-15 ENCOUNTER — Other Ambulatory Visit: Payer: Self-pay

## 2019-08-15 ENCOUNTER — Ambulatory Visit (INDEPENDENT_AMBULATORY_CARE_PROVIDER_SITE_OTHER): Payer: BC Managed Care – PPO

## 2019-08-15 DIAGNOSIS — Z23 Encounter for immunization: Secondary | ICD-10-CM

## 2019-09-06 ENCOUNTER — Encounter: Payer: Self-pay | Admitting: Internal Medicine

## 2019-09-06 ENCOUNTER — Other Ambulatory Visit: Payer: Self-pay

## 2019-09-06 ENCOUNTER — Ambulatory Visit (INDEPENDENT_AMBULATORY_CARE_PROVIDER_SITE_OTHER): Payer: BC Managed Care – PPO | Admitting: Internal Medicine

## 2019-09-06 VITALS — Ht 63.0 in | Wt 245.0 lb

## 2019-09-06 DIAGNOSIS — Z1329 Encounter for screening for other suspected endocrine disorder: Secondary | ICD-10-CM

## 2019-09-06 DIAGNOSIS — Z1211 Encounter for screening for malignant neoplasm of colon: Secondary | ICD-10-CM

## 2019-09-06 DIAGNOSIS — I10 Essential (primary) hypertension: Secondary | ICD-10-CM | POA: Diagnosis not present

## 2019-09-06 DIAGNOSIS — Z1231 Encounter for screening mammogram for malignant neoplasm of breast: Secondary | ICD-10-CM

## 2019-09-06 DIAGNOSIS — D259 Leiomyoma of uterus, unspecified: Secondary | ICD-10-CM | POA: Insufficient documentation

## 2019-09-06 DIAGNOSIS — K219 Gastro-esophageal reflux disease without esophagitis: Secondary | ICD-10-CM

## 2019-09-06 DIAGNOSIS — E559 Vitamin D deficiency, unspecified: Secondary | ICD-10-CM

## 2019-09-06 DIAGNOSIS — R739 Hyperglycemia, unspecified: Secondary | ICD-10-CM

## 2019-09-06 DIAGNOSIS — Z1389 Encounter for screening for other disorder: Secondary | ICD-10-CM

## 2019-09-06 MED ORDER — PANTOPRAZOLE SODIUM 40 MG PO TBEC: 40.0000 mg | DELAYED_RELEASE_TABLET | Freq: Every day | ORAL | 3 refills | Status: DC

## 2019-09-06 NOTE — Patient Instructions (Signed)
Gastroesophageal Reflux Disease, Adult Gastroesophageal reflux (GER) happens when acid from the stomach flows up into the tube that connects the mouth and the stomach (esophagus). Normally, food travels down the esophagus and stays in the stomach to be digested. However, when a person has GER, food and stomach acid sometimes move back up into the esophagus. If this becomes a more serious problem, the person may be diagnosed with a disease called gastroesophageal reflux disease (GERD). GERD occurs when the reflux:  Happens often.  Causes frequent or severe symptoms.  Causes problems such as damage to the esophagus. When stomach acid comes in contact with the esophagus, the acid may cause soreness (inflammation) in the esophagus. Over time, GERD may create small holes (ulcers) in the lining of the esophagus. What are the causes? This condition is caused by a problem with the muscle between the esophagus and the stomach (lower esophageal sphincter, or LES). Normally, the LES muscle closes after food passes through the esophagus to the stomach. When the LES is weakened or abnormal, it does not close properly, and that allows food and stomach acid to go back up into the esophagus. The LES can be weakened by certain dietary substances, medicines, and medical conditions, including:  Tobacco use.  Pregnancy.  Having a hiatal hernia.  Alcohol use.  Certain foods and beverages, such as coffee, chocolate, onions, and peppermint. What increases the risk? You are more likely to develop this condition if you:  Have an increased body weight.  Have a connective tissue disorder.  Use NSAID medicines. What are the signs or symptoms? Symptoms of this condition include:  Heartburn.  Difficult or painful swallowing.  The feeling of having a lump in the throat.  Abitter taste in the mouth.  Bad breath.  Having a large amount of saliva.  Having an upset or bloated  stomach.  Belching.  Chest pain. Different conditions can cause chest pain. Make sure you see your health care provider if you experience chest pain.  Shortness of breath or wheezing.  Ongoing (chronic) cough or a night-time cough.  Wearing away of tooth enamel.  Weight loss. How is this diagnosed? Your health care provider will take a medical history and perform a physical exam. To determine if you have mild or severe GERD, your health care provider may also monitor how you respond to treatment. You may also have tests, including:  A test to examine your stomach and esophagus with a small camera (endoscopy).  A test thatmeasures the acidity level in your esophagus.  A test thatmeasures how much pressure is on your esophagus.  A barium swallow or modified barium swallow test to show the shape, size, and functioning of your esophagus. How is this treated? The goal of treatment is to help relieve your symptoms and to prevent complications. Treatment for this condition may vary depending on how severe your symptoms are. Your health care provider may recommend:  Changes to your diet.  Medicine.  Surgery. Follow these instructions at home: Eating and drinking   Follow a diet as recommended by your health care provider. This may involve avoiding foods and drinks such as: ? Coffee and tea (with or without caffeine). ? Drinks that containalcohol. ? Energy drinks and sports drinks. ? Carbonated drinks or sodas. ? Chocolate and cocoa. ? Peppermint and mint flavorings. ? Garlic and onions. ? Horseradish. ? Spicy and acidic foods, including peppers, chili powder, curry powder, vinegar, hot sauces, and barbecue sauce. ? Citrus fruit juices and citrus   fruits, such as oranges, lemons, and limes. ? Tomato-based foods, such as red sauce, chili, salsa, and pizza with red sauce. ? Fried and fatty foods, such as donuts, french fries, potato chips, and high-fat dressings. ? High-fat  meats, such as hot dogs and fatty cuts of red and white meats, such as rib eye steak, sausage, ham, and bacon. ? High-fat dairy items, such as whole milk, butter, and cream cheese.  Eat small, frequent meals instead of large meals.  Avoid drinking large amounts of liquid with your meals.  Avoid eating meals during the 2-3 hours before bedtime.  Avoid lying down right after you eat.  Do not exercise right after you eat. Lifestyle   Do not use any products that contain nicotine or tobacco, such as cigarettes, e-cigarettes, and chewing tobacco. If you need help quitting, ask your health care provider.  Try to reduce your stress by using methods such as yoga or meditation. If you need help reducing stress, ask your health care provider.  If you are overweight, reduce your weight to an amount that is healthy for you. Ask your health care provider for guidance about a safe weight loss goal. General instructions  Pay attention to any changes in your symptoms.  Take over-the-counter and prescription medicines only as told by your health care provider. Do not take aspirin, ibuprofen, or other NSAIDs unless your health care provider told you to do so.  Wear loose-fitting clothing. Do not wear anything tight around your waist that causes pressure on your abdomen.  Raise (elevate) the head of your bed about 6 inches (15 cm).  Avoid bending over if this makes your symptoms worse.  Keep all follow-up visits as told by your health care provider. This is important. Contact a health care provider if:  You have: ? New symptoms. ? Unexplained weight loss. ? Difficulty swallowing or it hurts to swallow. ? Wheezing or a persistent cough. ? A hoarse voice.  Your symptoms do not improve with treatment. Get help right away if you:  Have pain in your arms, neck, jaw, teeth, or back.  Feel sweaty, dizzy, or light-headed.  Have chest pain or shortness of breath.  Vomit and your vomit looks  like blood or coffee grounds.  Faint.  Have stool that is bloody or black.  Cannot swallow, drink, or eat. Summary  Gastroesophageal reflux happens when acid from the stomach flows up into the esophagus. GERD is a disease in which the reflux happens often, causes frequent or severe symptoms, or causes problems such as damage to the esophagus.  Treatment for this condition may vary depending on how severe your symptoms are. Your health care provider may recommend diet and lifestyle changes, medicine, or surgery.  Contact a health care provider if you have new or worsening symptoms.  Take over-the-counter and prescription medicines only as told by your health care provider. Do not take aspirin, ibuprofen, or other NSAIDs unless your health care provider told you to do so.  Keep all follow-up visits as told by your health care provider. This is important. This information is not intended to replace advice given to you by your health care provider. Make sure you discuss any questions you have with your health care provider. Document Revised: 02/24/2018 Document Reviewed: 02/24/2018 Elsevier Patient Education  2020 [place] for Gastroesophageal Reflux Disease, Adult When you have gastroesophageal reflux disease (GERD), the foods you eat and your eating habits are very important. Choosing the right foods  can help ease the discomfort of GERD. Consider working with a diet and nutrition specialist (dietitian) to help you make healthy food choices. What general guidelines should I follow?  Eating plan  Choose healthy foods low in fat, such as fruits, vegetables, whole grains, low-fat dairy products, and lean meat, fish, and poultry.  Eat frequent, small meals instead of three large meals each day. Eat your meals slowly, in a relaxed setting. Avoid bending over or lying down until 2-3 hours after eating.  Limit high-fat foods such as fatty meats or fried foods.  Limit your  intake of oils, butter, and shortening to less than 8 teaspoons each day.  Avoid the following: ? Foods that cause symptoms. These may be different for different people. Keep a food diary to keep track of foods that cause symptoms. ? Alcohol. ? Drinking large amounts of liquid with meals. ? Eating meals during the 2-3 hours before bed.  Cook foods using methods other than frying. This may include baking, grilling, or broiling. Lifestyle  Maintain a healthy weight. Ask your health care provider what weight is healthy for you. If you need to lose weight, work with your health care provider to do so safely.  Exercise for at least 30 minutes on 5 or more days each week, or as told by your health care provider.  Avoid wearing clothes that fit tightly around your waist and chest.  Do not use any products that contain nicotine or tobacco, such as cigarettes and e-cigarettes. If you need help quitting, ask your health care provider.  Sleep with the head of your bed raised. Use a wedge under the mattress or blocks under the bed frame to raise the head of the bed. What foods are not recommended? The items listed may not be a complete list. Talk with your dietitian about what dietary choices are best for you. Grains Pastries or quick breads with added fat. [place] toast. Vegetables Deep fried vegetables. [place] fries. Any vegetables prepared with added fat. Any vegetables that cause symptoms. For some people this may include tomatoes and tomato products, chili peppers, onions and garlic, and horseradish. Fruits Any fruits prepared with added fat. Any fruits that cause symptoms. For some people this may include citrus fruits, such as oranges, grapefruit, pineapple, and lemons. Meats and other protein foods High-fat meats, such as fatty beef or pork, hot dogs, ribs, ham, sausage, salami and bacon. Fried meat or protein, including fried fish and fried chicken. Nuts and nut butters. Dairy Whole milk  and chocolate milk. Sour cream. Cream. Ice cream. Cream cheese. Milk shakes. Beverages Coffee and tea, with or without caffeine. Carbonated beverages. Sodas. Energy drinks. Fruit juice made with acidic fruits (such as orange or grapefruit). Tomato juice. Alcoholic drinks. Fats and oils Butter. Margarine. Shortening. Ghee. Sweets and desserts Chocolate and cocoa. Donuts. Seasoning and other foods Pepper. Peppermint and spearmint. Any condiments, herbs, or seasonings that cause symptoms. For some people, this may include curry, hot sauce, or vinegar-based salad dressings. Summary  When you have gastroesophageal reflux disease (GERD), food and lifestyle choices are very important to help ease the discomfort of GERD.  Eat frequent, small meals instead of three large meals each day. Eat your meals slowly, in a relaxed setting. Avoid bending over or lying down until 2-3 hours after eating.  Limit high-fat foods such as fatty meat or fried foods. This information is not intended to replace advice given to you by your health care provider. Make sure you discuss  any questions you have with your health care provider. Document Revised: 12/09/2018 Document Reviewed: 08/19/2016 Elsevier Patient Education  [place].

## 2019-09-06 NOTE — Progress Notes (Signed)
Telephone Note  I connected with Anita Gibbs  on 09/06/19 at  3:30 PM EST by telephone application and verified that I am speaking with the correct person using two identifiers.  Location patient: home Location provider:work or home office Persons participating in the virtual visit: patient, provider  I discussed the limitations of evaluation and management by telemedicine and the availability of in person appointments. The patient expressed understanding and agreed to proceed.   HPI: 1. gerd uncontrolled worse with dips, carbonated sodas I.e gingerale and spicy foods she feels at times food is stuck in throat 2-3 x per day no pain or trouble swallowing liqs/solids and has sour taste in mouth x 3 weeks wakes her up at 2-3 am in the am pepcid ac 20 mg is not helping   2. HTN on losartan 50 mg qd, norvasc 5 mg qd not checking BP   ROS: See pertinent positives and negatives per HPI.  Past Medical History:  Diagnosis Date  . Arthritis   . Carpal boss, right   . Carpal tunnel syndrome, bilateral   . Headache   . Hypertension   . Seasonal asthma 11/29/2018    Past Surgical History:  Procedure Laterality Date  . ANTERIOR CERVICAL DECOMP/DISCECTOMY FUSION N/A 07/01/2017   Procedure: ANTERIOR CERVICAL DECOMPRESSION/DISCECTOMY FUSION 3 LEVELS;  Surgeon: Meade Maw, MD;  Location: [facility] ORS;  Service: Neurosurgery;  Laterality: N/A;  . KNEE ARTHROSCOPY Right     Family History  Problem Relation Age of Onset  . [name] Mother   . Diabetes Mother   . Arthritis Mother   . Hypertension Mother   . Hyperlipidemia Mother   . Hypertension Father   . Arthritis Father   . Hearing loss Father   . Kidney disease Father   . Arthritis Sister   . Hypertension Sister   . Cancer Brother   . COPD Brother   . Early death Brother   . COPD Son   . Depression Son   . Breast cancer Maternal Aunt   . Breast cancer Paternal Aunt   . Allergic rhinitis Neg Hx   . Angioedema Neg Hx   . Eczema Neg  Hx   . Urticaria Neg Hx   . Asthma Neg Hx     SOCIAL HX:  Bus driver     Current Outpatient Medications:  .  albuterol (PROVENTIL HFA;VENTOLIN HFA) 108 (90 Base) MCG/ACT inhaler, Inhale 2 puffs into the lungs every 6 (six) hours as needed for wheezing or shortness of breath., Disp: 1 Inhaler, Rfl: 5 .  amLODipine (NORVASC) 5 MG tablet, Take 1 tablet (5 mg total) by mouth daily., Disp: 90 tablet, Rfl: 1 .  Cetirizine HCl (ZYRTEC PO), Take 25 mg by mouth., Disp: , Rfl:  .  diphenhydrAMINE (BENADRYL) 25 mg capsule, Take 25 mg by mouth every 6 (six) hours as needed (for allergies.)., Disp: , Rfl:  .  EPINEPHrine (AUVI-Q) 0.3 mg/0.3 mL IJ SOAJ injection, Use as directed for severe allergic reaction, Disp: 2 Device, Rfl: 2 .  famotidine (PEPCID) 20 MG tablet, Take 1 tablet (20 mg total) by mouth 2 (two) times daily., Disp: 60 tablet, Rfl: 5 .  fluticasone (FLONASE) 50 MCG/ACT nasal spray, Place 2 sprays into both nostrils daily., Disp: 16 g, Rfl: 6 .  gabapentin (NEURONTIN) 300 MG capsule, Take 1-2 capsules (300-600 mg total) by mouth 3 (three) times daily., Disp: 180 capsule, Rfl: 2 .  losartan (COZAAR) 100 MG tablet, Take 1 tablet (100 mg total) by mouth  daily., Disp: 90 tablet, Rfl: 3 .  [START ON 10/11/2019] methocarbamol (ROBAXIN) 750 MG tablet, Take 1 tablet (750 mg total) by mouth every 8 (eight) hours as needed for muscle spasms., Disp: 90 tablet, Rfl: 5 .  methylPREDNISolone (MEDROL DOSEPAK) 4 MG TBPK tablet, Medrol dosepack 6 day taper.  Take as directed., Disp: 21 tablet, Rfl: 0 .  montelukast (SINGULAIR) 10 MG tablet, Take 1 tablet (10 mg total) by mouth at bedtime., Disp: 30 tablet, Rfl: 5 .  norethindrone (AYGESTIN) 5 MG tablet, 2 (two) times daily. , Disp: , Rfl:  .  oxyCODONE (OXY IR/ROXICODONE) 5 MG immediate release tablet, Take 1 tablet (5 mg total) by mouth daily as needed for severe pain. Must last 30 days, Disp: 30 tablet, Rfl: 0 .  [START ON 09/11/2019] oxyCODONE (OXY  IR/ROXICODONE) 5 MG immediate release tablet, Take 1 tablet (5 mg total) by mouth daily as needed for severe pain. Must last 30 days, Disp: 30 tablet, Rfl: 0 .  [START ON 10/11/2019] oxyCODONE (OXY IR/ROXICODONE) 5 MG immediate release tablet, Take 1 tablet (5 mg total) by mouth daily as needed for severe pain. Must last 30 days, Disp: 30 tablet, Rfl: 0 .  [START ON 11/10/2019] oxyCODONE (OXY IR/ROXICODONE) 5 MG immediate release tablet, Take 1 tablet (5 mg total) by mouth daily as needed for severe pain. Must last 30 days, Disp: 30 tablet, Rfl: 0 .  pantoprazole (PROTONIX) 40 MG tablet, Take 1 tablet (40 mg total) by mouth daily. 30 min. Before dinner, Disp: 90 tablet, Rfl: 3  EXAM:  VITALS per patient if applicable:  GENERAL: alert, oriented, appears well and in no acute distress  HEENT: atraumatic, conjunttiva clear, no obvious abnormalities on inspection of external nose and ears  NECK: normal movements of the head and neck  LUNGS: on inspection no signs of respiratory distress, breathing rate appears normal, no obvious gross SOB, gasping or wheezing  CV: no obvious cyanosis  MS: moves all visible extremities without noticeable abnormality  PSYCH/NEURO: pleasant and cooperative, no obvious depression or anxiety, speech and thought processing grossly intact  ASSESSMENT AND PLAN:  Discussed the following assessment and plan:  Gastroesophageal reflux disease, unspecified whether esophagitis present - Plan: pantoprazole (PROTONIX) 40 MG tablet, Ambulatory referral to Gastroenterology Hold pepcid for now if has problems can try this in the am if needed   Essential hypertension - Plan: Comprehensive metabolic panel, CBC with Differential/Platelet, Lipid panel -rec monitor BP  Cont meds   Vitamin D deficiency - Plan: Vitamin D (25 hydroxy)  Hyperglycemia - Plan: HgB A1c  HM Flu shot utd  Tdap ask at f/u  mammo can call to schedule due 09/2019  Encounter for screening colonoscopy -  Plan: Ambulatory referral to Gastroenterology w/u GERD and screening colonoscopy  02/26/18 neg neg hpv h/o abnl h/o fibroids Dr.Song  -we discussed possible serious and likely etiologies, options for evaluation and workup, limitations of telemedicine visit vs in person visit, treatment, treatment risks and precautions. Pt prefers to treat via telemedicine empirically rather then risking or undertaking an in person visit at this moment. Patient agrees to seek prompt in person care if worsening, new symptoms arise, or if is not improving with treatment.   I discussed the assessment and treatment plan with the patient. The patient was provided an opportunity to ask questions and all were answered. The patient agreed with the plan and demonstrated an understanding of the instructions.   The patient was advised to call back or seek  an in-person evaluation if the symptoms worsen or if the condition fails to improve as anticipated.  Time spent  30-39 minutes  Delorise Jackson, MD

## 2019-09-07 ENCOUNTER — Telehealth: Payer: BC Managed Care – PPO | Admitting: Pain Medicine

## 2019-09-23 ENCOUNTER — Other Ambulatory Visit: Payer: Self-pay | Admitting: Internal Medicine

## 2019-09-23 ENCOUNTER — Telehealth: Payer: Self-pay | Admitting: Internal Medicine

## 2019-09-23 DIAGNOSIS — I10 Essential (primary) hypertension: Secondary | ICD-10-CM

## 2019-09-23 MED ORDER — LOSARTAN POTASSIUM 100 MG PO TABS: 100.0000 mg | ORAL_TABLET | Freq: Every day | ORAL | 3 refills | Status: DC

## 2019-09-23 NOTE — Telephone Encounter (Signed)
Pt needs a refill on her losartan (COZAAR) 100 MG tablet, only two pills remaining.

## 2019-09-28 ENCOUNTER — Ambulatory Visit: Payer: BC Managed Care – PPO | Admitting: Family

## 2019-09-30 ENCOUNTER — Ambulatory Visit
Admission: RE | Admit: 2019-09-30 | Discharge: 2019-09-30 | Disposition: A | Discharge status: Home / Self Care | Payer: BC Managed Care – PPO | Source: Ambulatory Visit | Attending: Internal Medicine | Admitting: Internal Medicine

## 2019-09-30 DIAGNOSIS — Z1231 Encounter for screening mammogram for malignant neoplasm of breast: Secondary | ICD-10-CM | POA: Diagnosis present

## 2019-10-10 ENCOUNTER — Other Ambulatory Visit: Payer: BC Managed Care – PPO

## 2019-10-10 ENCOUNTER — Other Ambulatory Visit (INDEPENDENT_AMBULATORY_CARE_PROVIDER_SITE_OTHER): Payer: BC Managed Care – PPO

## 2019-10-10 ENCOUNTER — Other Ambulatory Visit: Payer: Self-pay

## 2019-10-10 ENCOUNTER — Other Ambulatory Visit: Payer: Self-pay | Admitting: Internal Medicine

## 2019-10-10 ENCOUNTER — Encounter: Payer: Self-pay | Admitting: Internal Medicine

## 2019-10-10 DIAGNOSIS — R319 Hematuria, unspecified: Secondary | ICD-10-CM

## 2019-10-10 DIAGNOSIS — I10 Essential (primary) hypertension: Secondary | ICD-10-CM

## 2019-10-10 DIAGNOSIS — R7303 Prediabetes: Secondary | ICD-10-CM | POA: Insufficient documentation

## 2019-10-10 DIAGNOSIS — E559 Vitamin D deficiency, unspecified: Secondary | ICD-10-CM

## 2019-10-10 DIAGNOSIS — R739 Hyperglycemia, unspecified: Secondary | ICD-10-CM | POA: Diagnosis not present

## 2019-10-10 DIAGNOSIS — Z1329 Encounter for screening for other suspected endocrine disorder: Secondary | ICD-10-CM

## 2019-10-10 DIAGNOSIS — E785 Hyperlipidemia, unspecified: Secondary | ICD-10-CM | POA: Insufficient documentation

## 2019-10-10 DIAGNOSIS — Z1389 Encounter for screening for other disorder: Secondary | ICD-10-CM

## 2019-10-10 HISTORY — DX: Prediabetes: R73.03

## 2019-10-10 LAB — CBC WITH DIFFERENTIAL/PLATELET
Basophils Absolute: 0 10*3/uL (ref 0.0–0.1)
Basophils Relative: 0.6 % (ref 0.0–3.0)
Eosinophils Absolute: 0.1 10*3/uL (ref 0.0–0.7)
Eosinophils Relative: 1.8 % (ref 0.0–5.0)
HCT: 40.2 % (ref 36.0–46.0)
Hemoglobin: 13.3 g/dL (ref 12.0–15.0)
Lymphocytes Relative: 47.1 % — ABNORMAL HIGH (ref 12.0–46.0)
Lymphs Abs: 3.2 10*3/uL (ref 0.7–4.0)
MCHC: 33.2 g/dL (ref 30.0–36.0)
MCV: 102.5 fl — ABNORMAL HIGH (ref 78.0–100.0)
Monocytes Absolute: 0.4 10*3/uL (ref 0.1–1.0)
Monocytes Relative: 5.4 % (ref 3.0–12.0)
Neutro Abs: 3.1 10*3/uL (ref 1.4–7.7)
Neutrophils Relative %: 45.1 % (ref 43.0–77.0)
Platelets: 371 10*3/uL (ref 150.0–400.0)
RBC: 3.92 Mil/uL (ref 3.87–5.11)
RDW: 14 % (ref 11.5–15.5)
WBC: 6.8 10*3/uL (ref 4.0–10.5)

## 2019-10-10 LAB — COMPREHENSIVE METABOLIC PANEL
ALT: 19 U/L (ref 0–35)
AST: 16 U/L (ref 0–37)
Albumin: 4.2 g/dL (ref 3.5–5.2)
Alkaline Phosphatase: 41 U/L (ref 39–117)
BUN: 14 mg/dL (ref 6–23)
CO2: 23 mEq/L (ref 19–32)
Calcium: 9.4 mg/dL (ref 8.4–10.5)
Chloride: 104 mEq/L (ref 96–112)
Creatinine, Ser: 1.05 mg/dL (ref 0.40–1.20)
GFR: 68.2 mL/min (ref >60.00)
Glucose, Bld: 111 mg/dL — ABNORMAL HIGH (ref 70–99)
Potassium: 3.9 mEq/L (ref 3.5–5.1)
Sodium: 138 mEq/L (ref 135–145)
Total Bilirubin: 0.5 mg/dL (ref 0.2–1.2)
Total Protein: 7.7 g/dL (ref 6.0–8.3)

## 2019-10-10 LAB — LIPID PANEL
Cholesterol: 227 mg/dL — ABNORMAL HIGH (ref 0–200)
HDL: 41.1 mg/dL (ref >39.00)
LDL Cholesterol: 171 mg/dL — ABNORMAL HIGH (ref 0–99)
NonHDL: 186.26
Total CHOL/HDL Ratio: 6
Triglycerides: 77 mg/dL (ref 0.0–149.0)
VLDL: 15.4 mg/dL (ref 0.0–40.0)

## 2019-10-10 LAB — VITAMIN D 25 HYDROXY (VIT D DEFICIENCY, FRACTURES): VITD: 11.15 ng/mL — ABNORMAL LOW (ref 30.00–100.00)

## 2019-10-10 LAB — TSH: TSH: 3.33 u[IU]/mL (ref 0.35–4.50)

## 2019-10-10 LAB — HEMOGLOBIN A1C: Hgb A1c MFr Bld: 6.1 % (ref 4.6–6.5)

## 2019-10-11 ENCOUNTER — Other Ambulatory Visit: Payer: Self-pay | Admitting: Internal Medicine

## 2019-10-11 ENCOUNTER — Telehealth: Payer: Self-pay | Admitting: Internal Medicine

## 2019-10-11 DIAGNOSIS — R319 Hematuria, unspecified: Secondary | ICD-10-CM

## 2019-10-11 LAB — URINALYSIS, ROUTINE W REFLEX MICROSCOPIC
Bacteria, UA: NONE SEEN /HPF
Bilirubin Urine: NEGATIVE
Glucose, UA: NEGATIVE
Hyaline Cast: NONE SEEN /LPF
Ketones, ur: NEGATIVE
Leukocytes,Ua: NEGATIVE
Nitrite: NEGATIVE
Specific Gravity, Urine: 1.026 (ref 1.001–1.03)
WBC, UA: NONE SEEN /HPF (ref 0–5)
pH: <=5 (ref 5.0–8.0)

## 2019-10-11 NOTE — Telephone Encounter (Signed)
Once I have reply I will call about results. She has viewed already on mychart.

## 2019-10-11 NOTE — Telephone Encounter (Signed)
  Call pt about labs review of recent and needs to sch f/u with me as well  No particular tests for cancer other than  mammo normal 09/2019  Encounter for screening colonoscopy - Plan: Ambulatory referral to Gastroenterology w/u GERD and screening colonoscopy  -referral placed did she here from GI   02/26/18 neg neg hpv h/o abnl h/o fibroids Dr.Song -pap due every 3 years so due in 02/26/21 with ob/gyn if still sees Dr. Jerl Santos   [place]

## 2019-10-11 NOTE — Telephone Encounter (Signed)
Pt called in and wants to know if there are any tests she could take to be tested for cancer?  Pt also wants a call about regarding her lab results from yesterday.

## 2019-10-11 NOTE — Addendum Note (Signed)
Addended by: Leeanne Rio on: 10/11/2019 02:17 PM   Modules accepted: Orders

## 2019-10-12 ENCOUNTER — Other Ambulatory Visit: Payer: Self-pay | Admitting: Internal Medicine

## 2019-10-12 DIAGNOSIS — E785 Hyperlipidemia, unspecified: Secondary | ICD-10-CM

## 2019-10-12 LAB — URINE CULTURE
MICRO NUMBER:: 10132573
SPECIMEN QUALITY:: ADEQUATE

## 2019-10-12 MED ORDER — ATORVASTATIN CALCIUM 10 MG PO TABS: 10.0000 mg | ORAL_TABLET | Freq: Every day | ORAL | 3 refills | Status: DC

## 2019-10-12 NOTE — Telephone Encounter (Signed)
Patient has been informed. Also schedule for tdap per request.

## 2019-10-13 ENCOUNTER — Other Ambulatory Visit: Payer: Self-pay

## 2019-10-13 ENCOUNTER — Ambulatory Visit (INDEPENDENT_AMBULATORY_CARE_PROVIDER_SITE_OTHER): Payer: BC Managed Care – PPO | Admitting: Lab

## 2019-10-13 DIAGNOSIS — Z23 Encounter for immunization: Secondary | ICD-10-CM | POA: Diagnosis not present

## 2019-10-25 ENCOUNTER — Ambulatory Visit (HOSPITAL_BASED_OUTPATIENT_CLINIC_OR_DEPARTMENT_OTHER): Payer: BC Managed Care – PPO | Admitting: Pain Medicine

## 2019-10-25 ENCOUNTER — Encounter: Payer: Self-pay | Admitting: Pain Medicine

## 2019-10-25 ENCOUNTER — Ambulatory Visit
Admission: RE | Admit: 2019-10-25 | Discharge: 2019-10-25 | Disposition: A | Discharge status: Home / Self Care | Payer: BC Managed Care – PPO | Source: Ambulatory Visit | Attending: Pain Medicine | Admitting: Pain Medicine

## 2019-10-25 ENCOUNTER — Other Ambulatory Visit: Payer: Self-pay

## 2019-10-25 VITALS — BP 116/63 | HR 69 | Temp 98.0°F | Resp 21 | Ht 63.0 in | Wt 250.0 lb

## 2019-10-25 DIAGNOSIS — M47816 Spondylosis without myelopathy or radiculopathy, lumbar region: Secondary | ICD-10-CM | POA: Insufficient documentation

## 2019-10-25 DIAGNOSIS — M47817 Spondylosis without myelopathy or radiculopathy, lumbosacral region: Secondary | ICD-10-CM | POA: Diagnosis present

## 2019-10-25 DIAGNOSIS — M5136 Other intervertebral disc degeneration, lumbar region: Secondary | ICD-10-CM | POA: Insufficient documentation

## 2019-10-25 DIAGNOSIS — M545 Low back pain, unspecified: Secondary | ICD-10-CM

## 2019-10-25 DIAGNOSIS — M792 Neuralgia and neuritis, unspecified: Secondary | ICD-10-CM | POA: Diagnosis present

## 2019-10-25 DIAGNOSIS — G8929 Other chronic pain: Secondary | ICD-10-CM | POA: Insufficient documentation

## 2019-10-25 MED ORDER — FENTANYL CITRATE (PF) 100 MCG/2ML IJ SOLN
25.0000 ug | INTRAMUSCULAR | Status: DC | PRN
  Administered 2019-10-25: 50 ug via INTRAVENOUS
  Filled 2019-10-25: qty 2

## 2019-10-25 MED ORDER — MIDAZOLAM HCL 5 MG/5ML IJ SOLN
1.0000 mg | INTRAMUSCULAR | Status: DC | PRN
  Administered 2019-10-25: 10:00:00 2 mg via INTRAVENOUS
  Filled 2019-10-25: qty 5

## 2019-10-25 MED ORDER — TRIAMCINOLONE ACETONIDE 40 MG/ML IJ SUSP
80.0000 mg | Freq: Once | INTRAMUSCULAR | Status: AC
  Administered 2019-10-25: 80 mg
  Filled 2019-10-25: qty 2

## 2019-10-25 MED ORDER — GABAPENTIN 300 MG PO CAPS: 300.0000 mg | ORAL_CAPSULE | Freq: Three times a day (TID) | ORAL | 5 refills | Status: DC

## 2019-10-25 MED ORDER — LACTATED RINGERS IV SOLN
1000.0000 mL | Freq: Once | INTRAVENOUS | Status: AC
  Administered 2019-10-25: 10:00:00 1000 mL via INTRAVENOUS

## 2019-10-25 MED ORDER — ROPIVACAINE HCL 2 MG/ML IJ SOLN
18.0000 mL | Freq: Once | INTRAMUSCULAR | Status: AC
  Administered 2019-10-25: 10:00:00 18 mL via PERINEURAL
  Filled 2019-10-25: qty 20

## 2019-10-25 MED ORDER — LIDOCAINE HCL 2 % IJ SOLN
20.0000 mL | Freq: Once | INTRAMUSCULAR | Status: AC
  Administered 2019-10-25: 400 mg
  Filled 2019-10-25: qty 40

## 2019-10-25 NOTE — Patient Instructions (Signed)

## 2019-10-25 NOTE — Progress Notes (Signed)
PROVIDER NOTE: Information contained herein reflects review and annotations entered in association with encounter. Interpretation of such information and data should be left to medically-trained personnel. Information provided to patient can be located elsewhere in the medical record under "Patient Instructions". Document created using STT-dictation technology, any transcriptional errors that may result from process are unintentional.    Patient: Anita Gibbs  Service Category: Procedure  Provider: Gaspar Cola, MD  DOB: 09/11/1970  DOS: 10/25/2019  Location: [facility]  MRN: [id number]  Setting: Ambulatory - outpatient  Referring Provider: McLean-Scocuzza, Olivia Mackie *  Type: Established Patient  Specialty: Interventional Pain Management  PCP: McLean-Scocuzza, Nino Glow, MD   Primary Reason for Visit: Interventional Pain Management Treatment. CC: Back Pain (LOWER)  Procedure:          Anesthesia, Analgesia, Anxiolysis:  Type: Lumbar Facet, Medial Branch Block(s) #3  Primary Purpose: Therapeutic Region: Posterolateral Lumbosacral Spine Level: L2, L3, L4, L5, & S1 Medial Branch Level(s). Injecting these levels blocks the L3-4, L4-5, and L5-S1 lumbar facet joints. Laterality: Bilateral  Type: Moderate (Conscious) Sedation combined with Local Anesthesia Indication(s): Analgesia and Anxiety Route: Intravenous (IV) IV Access: Secured Sedation: Meaningful verbal contact was maintained at all times during the procedure  Local Anesthetic: Lidocaine 1-2%  Position: Prone   Indications: 1. Lumbar facet syndrome (Bilateral) (R>L)   2. Spondylosis without myelopathy or radiculopathy, lumbosacral region   3. DDD (degenerative disc disease), lumbar   4. Chronic low back pain [facility] Area of Pain) (Bilateral) (R>L) w/o sciatica   5. Neurogenic pain    Pain Score: Pre-procedure: 6 /10 Post-procedure: 0-No pain/10   Pre-op Assessment:  Anita Gibbs is a 49 y.o. (year old),  female patient, seen today for interventional treatment. She  has a past surgical history that includes Knee arthroscopy (Right) and Anterior cervical decomp/discectomy fusion (N/A, 07/01/2017). Anita Gibbs has a current medication list which includes the following prescription(s): albuterol, amlodipine, atorvastatin, cetirizine hcl, diphenhydramine, epinephrine, famotidine, fluticasone, losartan, methocarbamol, methylprednisolone, montelukast, norethindrone, oxycodone, [START ON 11/10/2019] oxycodone, pantoprazole, and [START ON 11/08/2019] gabapentin, and the following Facility-Administered Medications: fentanyl and midazolam. Her primarily concern today is the Back Pain (LOWER)  Initial Vital Signs:  Pulse/HCG Rate: 69ECG Heart Rate: 61 Temp: 98 F (36.7 C) Resp: 20 BP: 135/73 SpO2: 100 %  BMI: Estimated body mass index is 44.29 kg/m as calculated from the following:   Height as of this encounter: 5\' 3"  (1.6 m).   Weight as of this encounter: 250 lb (113.4 kg).  Risk Assessment: Allergies: Reviewed. She is allergic to other; shellfish allergy; strawberry (diagnostic); and tomato.  Allergy Precautions: None required Coagulopathies: Reviewed. None identified.  Blood-thinner therapy: None at this time Active Infection(s): Reviewed. None identified. Anita Gibbs is afebrile  Site Confirmation: Anita Gibbs was asked to confirm the procedure and laterality before marking the site Procedure checklist: Completed Consent: Before the procedure and under the influence of no sedative(s), amnesic(s), or anxiolytics, the patient was informed of the treatment options, risks and possible complications. To fulfill our ethical and legal obligations, as recommended by the American Medical Association's Code of Ethics, I have informed the patient of my clinical impression; the nature and purpose of the treatment or procedure; the risks, benefits, and possible complications of the intervention; the alternatives,  including doing nothing; the risk(s) and benefit(s) of the alternative treatment(s) or procedure(s); and the risk(s) and benefit(s) of doing nothing. The patient was provided information about the general risks and possible complications associated  with the procedure. These may include, but are not limited to: failure to achieve desired goals, infection, bleeding, organ or nerve damage, allergic reactions, paralysis, and death. In addition, the patient was informed of those risks and complications associated to Spine-related procedures, such as failure to decrease pain; infection (i.e.: Meningitis, epidural or intraspinal abscess); bleeding (i.e.: epidural hematoma, subarachnoid hemorrhage, or any other type of intraspinal or peri-dural bleeding); organ or nerve damage (i.e.: Any type of peripheral nerve, nerve root, or spinal cord injury) with subsequent damage to sensory, motor, and/or autonomic systems, resulting in permanent pain, numbness, and/or weakness of one or several areas of the body; allergic reactions; (i.e.: anaphylactic reaction); and/or death. Furthermore, the patient was informed of those risks and complications associated with the medications. These include, but are not limited to: allergic reactions (i.e.: anaphylactic or anaphylactoid reaction(s)); adrenal axis suppression; blood sugar elevation that in diabetics may result in ketoacidosis or comma; water retention that in patients with history of congestive heart failure may result in shortness of breath, pulmonary edema, and decompensation with resultant heart failure; weight gain; swelling or edema; medication-induced neural toxicity; particulate matter embolism and blood vessel occlusion with resultant organ, and/or nervous system infarction; and/or aseptic necrosis of one or more joints. Finally, the patient was informed that Medicine is not an exact science; therefore, there is also the possibility of unforeseen or unpredictable risks  and/or possible complications that may result in a catastrophic outcome. The patient indicated having understood very clearly. We have given the patient no guarantees and we have made no promises. Enough time was given to the patient to ask questions, all of which were answered to the patient's satisfaction. Anita Gibbs has indicated that she wanted to continue with the procedure. Attestation: I, the ordering provider, attest that I have discussed with the patient the benefits, risks, side-effects, alternatives, likelihood of achieving goals, and potential problems during recovery for the procedure that I have provided informed consent. Date  Time: 10/25/2019  9:16 AM  Pre-Procedure Preparation:  Monitoring: As per clinic protocol. Respiration, ETCO2, SpO2, BP, heart rate and rhythm monitor placed and checked for adequate function Safety Precautions: Patient was assessed for positional comfort and pressure points before starting the procedure. Time-out: I initiated and conducted the "Time-out" before starting the procedure, as per protocol. The patient was asked to participate by confirming the accuracy of the "Time Out" information. Verification of the correct person, site, and procedure were performed and confirmed by me, the nursing staff, and the patient. "Time-out" conducted as per Joint Commission's Universal Protocol (UP.01.01.01). Time: 0959  Description of Procedure:          Laterality: Bilateral. The procedure was performed in identical fashion on both sides. Levels:  L2, L3, L4, L5, & S1 Medial Branch Level(s) Area Prepped: Posterior Lumbosacral Region Prepping solution: DuraPrep (Iodine Povacrylex [0.7% available iodine] and Isopropyl Alcohol, 74% w/w) Safety Precautions: Aspiration looking for blood return was conducted prior to all injections. At no point did we inject any substances, as a needle was being advanced. Before injecting, the patient was told to immediately notify me if she  was experiencing any new onset of "ringing in the ears, or metallic taste in the mouth". No attempts were made at seeking any paresthesias. Safe injection practices and needle disposal techniques used. Medications properly checked for expiration dates. SDV (single dose vial) medications used. After the completion of the procedure, all disposable equipment used was discarded in the proper designated medical waste containers. Local  Anesthesia: Protocol guidelines were followed. The patient was positioned over the fluoroscopy table. The area was prepped in the usual manner. The time-out was completed. The target area was identified using fluoroscopy. A 12-in long, straight, sterile hemostat was used with fluoroscopic guidance to locate the targets for each level blocked. Once located, the skin was marked with an approved surgical skin marker. Once all sites were marked, the skin (epidermis, dermis, and hypodermis), as well as deeper tissues (fat, connective tissue and muscle) were infiltrated with a small amount of a short-acting local anesthetic, loaded on a 10cc syringe with a 25G, 1.5-in  Needle. An appropriate amount of time was allowed for local anesthetics to take effect before proceeding to the next step. Local Anesthetic: Lidocaine 2.0% The unused portion of the local anesthetic was discarded in the proper designated containers. Technical explanation of process:  L2 Medial Branch Nerve Block (MBB): The target area for the L2 medial branch is at the junction of the postero-lateral aspect of the superior articular process and the superior, posterior, and medial edge of the transverse process of L3. Under fluoroscopic guidance, a Quincke needle was inserted until contact was made with os over the superior postero-lateral aspect of the pedicular shadow (target area). After negative aspiration for blood, 0.5 mL of the nerve block solution was injected without difficulty or complication. The needle was removed  intact. L3 Medial Branch Nerve Block (MBB): The target area for the L3 medial branch is at the junction of the postero-lateral aspect of the superior articular process and the superior, posterior, and medial edge of the transverse process of L4. Under fluoroscopic guidance, a Quincke needle was inserted until contact was made with os over the superior postero-lateral aspect of the pedicular shadow (target area). After negative aspiration for blood, 0.5 mL of the nerve block solution was injected without difficulty or complication. The needle was removed intact. L4 Medial Branch Nerve Block (MBB): The target area for the L4 medial branch is at the junction of the postero-lateral aspect of the superior articular process and the superior, posterior, and medial edge of the transverse process of L5. Under fluoroscopic guidance, a Quincke needle was inserted until contact was made with os over the superior postero-lateral aspect of the pedicular shadow (target area). After negative aspiration for blood, 0.5 mL of the nerve block solution was injected without difficulty or complication. The needle was removed intact. L5 Medial Branch Nerve Block (MBB): The target area for the L5 medial branch is at the junction of the postero-lateral aspect of the superior articular process and the superior, posterior, and medial edge of the sacral ala. Under fluoroscopic guidance, a Quincke needle was inserted until contact was made with os over the superior postero-lateral aspect of the pedicular shadow (target area). After negative aspiration for blood, 0.5 mL of the nerve block solution was injected without difficulty or complication. The needle was removed intact. S1 Medial Branch Nerve Block (MBB): The target area for the S1 medial branch is at the posterior and inferior 6 o'clock position of the L5-S1 facet joint. Under fluoroscopic guidance, the Quincke needle inserted for the L5 MBB was redirected until contact was made with  os over the inferior and postero aspect of the sacrum, at the 6 o' clock position under the L5-S1 facet joint (Target area). After negative aspiration for blood, 0.5 mL of the nerve block solution was injected without difficulty or complication. The needle was removed intact.  Nerve block solution: 0.2% PF-Ropivacaine + Triamcinolone (  40 mg/mL) diluted to a final concentration of 4 mg of Triamcinolone/mL of Ropivacaine The unused portion of the solution was discarded in the proper designated containers. Procedural Needles: 22-gauge, 5-inch, Quincke needles used for all levels.  Once the entire procedure was completed, the treated area was cleaned, making sure to leave some of the prepping solution back to take advantage of its long term bactericidal properties.   Illustration of the posterior view of the lumbar spine and the posterior neural structures. Laminae of L2 through S1 are labeled. DPRL5, dorsal primary ramus of L5; DPRS1, dorsal primary ramus of S1; DPR3, dorsal primary ramus of L3; FJ, facet (zygapophyseal) joint L3-L4; I, inferior articular process of L4; LB1, lateral branch of dorsal primary ramus of L1; IAB, inferior articular branches from L3 medial branch (supplies L4-L5 facet joint); IBP, intermediate branch plexus; MB3, medial branch of dorsal primary ramus of L3; NR3, third lumbar nerve root; S, superior articular process of L5; SAB, superior articular branches from L4 (supplies L4-5 facet joint also); TP3, transverse process of L3.  Vitals:   10/25/19 1009 10/25/19 1020 10/25/19 1030 10/25/19 1040  BP: 124/70 125/73 115/63 116/63  Pulse:      Resp: 16 14 20  (!) 21  Temp:      SpO2: 99% 100% 99% 100%  Weight:      Height:         Start Time: 0959 hrs. End Time: 1009 hrs.  Imaging Guidance (Spinal):          Type of Imaging Technique: Fluoroscopy Guidance (Spinal) Indication(s): Assistance in needle guidance and placement for procedures requiring needle placement in or  near specific anatomical locations not easily accessible without such assistance. Exposure Time: Please see nurses notes. Contrast: None used. Fluoroscopic Guidance: I was personally present during the use of fluoroscopy. "Tunnel Vision Technique" used to obtain the best possible view of the target area. Parallax error corrected before commencing the procedure. "Direction-depth-direction" technique used to introduce the needle under continuous pulsed fluoroscopy. Once target was reached, antero-posterior, oblique, and lateral fluoroscopic projection used confirm needle placement in all planes. Images permanently stored in EMR. Interpretation: No contrast injected. I personally interpreted the imaging intraoperatively. Adequate needle placement confirmed in multiple planes. Permanent images saved into the patient's record.  Antibiotic Prophylaxis:   Anti-infectives (From admission, onward)   None     Indication(s): None identified  Post-operative Assessment:  Post-procedure Vital Signs:  Pulse/HCG Rate: 69(!) 59 Temp: 98 F (36.7 C) Resp: (!) 21 BP: 116/63 SpO2: 100 %  EBL: None  Complications: No immediate post-treatment complications observed by team, or reported by patient.  Note: The patient tolerated the entire procedure well. A repeat set of vitals were taken after the procedure and the patient was kept under observation following institutional policy, for this type of procedure. Post-procedural neurological assessment was performed, showing return to baseline, prior to discharge. The patient was provided with post-procedure discharge instructions, including a section on how to identify potential problems. Should any problems arise concerning this procedure, the patient was given instructions to immediately contact us, at any time, without hesitation. In any case, we plan to contact the patient by telephone for a follow-up status report regarding this interventional  procedure.  Comments:  No additional relevant information.  Plan of Care  Orders:  Orders Placed This Encounter  Procedures  . LUMBAR FACET(MEDIAL BRANCH NERVE BLOCK) MBNB    Scheduling Instructions:     Procedure: Lumbar facet block (AKA.: Lumbosacral medial  branch nerve block)     Side: Bilateral     Level: L3-4, L4-5, & L5-S1 Facets (L2, L3, L4, L5, & S1 Medial Branch Nerves)     Sedation: Patient's choice.     Timeframe: Today    Order Specific Question:   Where will this procedure be performed?    Answer:   [facility]  . DG [facility] C-ARM 1-60 MIN NO REPORT    Intraoperative interpretation by procedural physician at [place].    Standing Status:   Standing    Number of Occurrences:   1    Order Specific Question:   Reason for exam:    Answer:   Assistance in needle guidance and placement for procedures requiring needle placement in or near specific anatomical locations not easily accessible without such assistance.  . Informed Consent Details: Physician/Practitioner Attestation; Transcribe to consent form and obtain patient signature    Nursing Order: Transcribe to consent form and obtain patient signature. Note: Always confirm laterality of pain with Anita Gibbs, before procedure. Procedure: Lumbar Facet Block  under fluoroscopic guidance Indication/Reason: Low Back Pain, with our without leg pain, due to Facet Joint Arthralgia (Joint Pain) known as Lumbar Facet Syndrome, secondary to Lumbar, and/or Lumbosacral Spondylosis (Arthritis of the Spine), without myelopathy or radiculopathy (Nerve Damage). Provider Attestation: I, Corcoran Dossie Arbour, MD, (Pain Management Specialist), the physician/practitioner, attest that I have discussed with the patient the benefits, risks, side effects, alternatives, likelihood of achieving goals and potential problems during recovery for the procedure that I have provided informed consent.  . Provide equipment / supplies  at bedside    Equipment required: Single use, disposable, "Block Tray"    Standing Status:   Standing    Number of Occurrences:   1    Order Specific Question:   Specify    Answer:   Block Tray  . Miscellanous precautions    Standing Status:   Standing    Number of Occurrences:   1   Chronic Opioid Analgesic:  Oxycodone IR 5 mg, 1 tab PO QD (5 mg/day of oxycodone) (has enough to last until 11/28/2019) (please see the 04/20/2019 note.) MME/day:7.5mg /day.   Medications ordered for procedure: Meds ordered this encounter  Medications  . gabapentin (NEURONTIN) 300 MG capsule    Sig: Take 1-2 capsules (300-600 mg total) by mouth 3 (three) times daily.    Dispense:  180 capsule    Refill:  5    Fill one day early if pharmacy is closed on scheduled refill date. May substitute for generic if available.  . lidocaine (XYLOCAINE) 2 % (with pres) injection 400 mg  . lactated ringers infusion 1,000 mL  . midazolam (VERSED) 5 MG/5ML injection 1-2 mg    Make sure Flumazenil is available in the pyxis when using this medication. If oversedation occurs, administer 0.2 mg IV over 15 sec. If after 45 sec no response, administer 0.2 mg again over 1 min; may repeat at 1 min intervals; not to exceed 4 doses (1 mg)  . fentaNYL (SUBLIMAZE) injection 25-50 mcg    Make sure Narcan is available in the pyxis when using this medication. In the event of respiratory depression (RR< 8/min): Titrate NARCAN (naloxone) in increments of 0.1 to 0.2 mg IV at 2-3 minute intervals, until desired degree of reversal.  . ropivacaine (PF) 2 mg/mL (0.2%) (NAROPIN) injection 18 mL  . triamcinolone acetonide (KENALOG-40) injection 80 mg   Medications administered: We administered lidocaine, lactated ringers,  midazolam, fentaNYL, ropivacaine (PF) 2 mg/mL (0.2%), and triamcinolone acetonide.  See the medical record for exact dosing, route, and time of administration.  Follow-up plan:   Return in about 2 weeks (around 11/08/2019) for  (VV), (PP).       Interventional management options:  Considering:   NOTE: NO RFA until BMI is <35. Possible bilateral cervical facetRFA Diagnostic right L3-4 LESI Diagnostic bilateral L3 vs L4 TFESI  Possible bilateral lumbar facetRFA Diagnostic right vs bilateral SI joint injection Possible right vs bilateral SI jointRFA   Palliative PRN treatment(s):   Diagnostic/therapeutic bilateral lumbar facet block #2  Palliative right CESI #4  Diagnostic bilateral cervicalfacet block #2     Recent Visits Date Type Provider Dept  08/10/19 Telemedicine Milinda Pointer, MD [facility]  Showing recent visits within past 90 days and meeting all other requirements   Today's Visits Date Type Provider Dept  10/25/19 Procedure visit Milinda Pointer, MD [facility]  Showing today's visits and meeting all other requirements   Future Appointments Date Type Provider Dept  11/08/19 Appointment Milinda Pointer, [facility]  12/07/19 Appointment Milinda Pointer, MD [facility]  Showing future appointments within next 90 days and meeting all other requirements   Disposition: Discharge home  Discharge (Date  Time): 10/25/2019; 1040 hrs.   Primary Care Physician: McLean-Scocuzza, Nino Glow, MD Location: [facility] Note by: Gaspar Cola, MD Date: 10/25/2019; Time: 11:02 AM  Disclaimer:  Medicine is not an Chief Strategy Officer. The only guarantee in medicine is that nothing is guaranteed. It is important to note that the decision to proceed with this intervention was based on the information collected from the patient. The Data and conclusions were drawn from the patient's questionnaire, the interview, and the physical examination. Because the information was provided in large part by the patient, it cannot be guaranteed that it has not been purposely or unconsciously manipulated. Every effort has been made to  obtain as much relevant data as possible for this evaluation. It is important to note that the conclusions that lead to this procedure are derived in large part from the available data. Always take into account that the treatment will also be dependent on availability of resources and existing treatment guidelines, considered by other Pain Management Practitioners as being common knowledge and practice, at the time of the intervention. For Medico-Legal purposes, it is also important to point out that variation in procedural techniques and pharmacological choices are the acceptable norm. The indications, contraindications, technique, and results of the above procedure should only be interpreted and judged by a Board-Certified Interventional Pain Specialist with extensive familiarity and expertise in the same exact procedure and technique.

## 2019-10-25 NOTE — Progress Notes (Deleted)
Safety followed

## 2019-11-08 ENCOUNTER — Telehealth: Payer: BC Managed Care – PPO | Admitting: Pain Medicine

## 2019-11-09 ENCOUNTER — Telehealth: Payer: Self-pay

## 2019-11-09 ENCOUNTER — Encounter: Payer: Self-pay | Admitting: Pain Medicine

## 2019-11-09 NOTE — Telephone Encounter (Signed)
LM for patient to call office to go over pre virtual appointment questions.  

## 2019-11-10 ENCOUNTER — Encounter: Payer: Self-pay | Admitting: Pain Medicine

## 2019-11-10 NOTE — Progress Notes (Signed)
Pain relief after procedure (treated area only): (Questions asked to patient) 1. Starting about 15 minutes after the procedure, and "while the area was still numb" (from the local anesthetics), were you having any of your usual pain "in that area" (the treated area)?  (NOTE: NOT including the discomfort from the needle sticks.) First 1 hour: 100 % better. First 4-6 hours: 100 % better. 2. How long did the numbness from the local anesthetics last? (More than 6 hours?) Duration: 6 hours.  3. How much better is your pain now, when compared to before the procedure? Current benefit: 30 % better. 4. Can you move better now? Improvement in ROM (Range of Motion): Yes. 5. Can you do more now? Improvement in function: Yes. 4. Did you have any problems with the procedure? Side-effects/Complications: No.

## 2019-11-13 NOTE — Progress Notes (Signed)
Patient: Anita Gibbs  Service Category: E/M  Provider: Gaspar Cola, MD  DOB: 1970-09-04  DOS: 11/14/2019  Location: Office  MRN: [id number]  Setting: Ambulatory outpatient  Referring Provider: McLean-Scocuzza, Olivia Mackie *  Type: Established Patient  Specialty: Interventional Pain Management  PCP: McLean-Scocuzza, Nino Glow, MD  Location: Remote location  Delivery: TeleHealth     Virtual Encounter - Pain Management PROVIDER NOTE: Information contained herein reflects review and annotations entered in association with encounter. Interpretation of such information and data should be left to medically-trained personnel. Information provided to patient can be located elsewhere in the medical record under "Patient Instructions". Document created using STT-dictation technology, any transcriptional errors that may result from process are unintentional.    Contact & Pharmacy Preferred: [phone] Home: [phone] (home) Mobile: [phone] (mobile) E-mail: [email or web address]  [place], [place] - [address] [place] [address] Phone: [phone] Fax: [phone]   Pre-screening  Anita Gibbs offered "in-person" vs "virtual" encounter. She indicated preferring virtual for this encounter.   Reason COVID-19*  Social distancing based on CDC and AMA recommendations.   I contacted Anita Gibbs on 11/14/2019 via telephone.      I clearly identified myself as Gaspar Cola, MD. I verified that I was speaking with the correct person using two identifiers (Name: Anita Gibbs, and date of birth: 09-03-1970).  Consent I sought verbal advanced consent from Anita Gibbs for virtual visit interactions. I informed Anita Gibbs of possible security and privacy concerns, risks, and limitations associated with providing "not-in-person" medical evaluation and management services. I also informed Anita Gibbs of the availability of "in-person"  appointments. Finally, I informed her that there would be a charge for the virtual visit and that she could be  personally, fully or partially, financially responsible for it. Ms. Salido expressed understanding and agreed to proceed.   Historic Elements   Anita Gibbs is a 49 y.o. year old, female patient evaluated today after her last contact with our practice on 11/09/2019. Ms. Bhat  has a past medical history of Arthritis, Carpal boss, right, Carpal tunnel syndrome, bilateral, Headache, Hypertension, and Seasonal asthma (11/29/2018). She also  has a past surgical history that includes Knee arthroscopy (Right) and Anterior cervical decomp/discectomy fusion (N/A, 07/01/2017). Anita Gibbs has a current medication list which includes the following prescription(s): albuterol, amlodipine, atorvastatin, cetirizine hcl, diphenhydramine, epinephrine, famotidine, fluticasone, gabapentin, losartan, methocarbamol, methylprednisolone, montelukast, norethindrone, oxycodone, [START ON 02/10/2020] oxycodone, and pantoprazole. She  reports that she has never smoked. She has never used smokeless tobacco. She reports current alcohol use. She reports that she does not use drugs. Anita Gibbs is allergic to other; shellfish allergy; strawberry (diagnostic); and tomato.   HPI  Today, she is being contacted for both, medication management and a post-procedure assessment. She is using an average of 1/2 pill per day.  The patient again confirmed having attained 100% relief of the pain for the duration of the local anesthetic on her third bilateral lumbar facet block.  Unfortunately, she continues not to obtain any long-term benefit from the steroids suggesting that the primary etiology is not an inflammatory condition but mostly mechanical.  For this reason, the patient would be a much better candidate for radiofrequency ablation in order to attain that long-term benefit.  This was discussed with the patient who agreed  with the plan.  At this point, I believe it to be medically necessary for this patient to undergo  radiofrequency ablation for the purpose of extending the benefit from the lumbar facet blocks.  She clearly does attain good relief in addition to increasing range of motion and function.  Provocative test such as hypertension and rotation of the lumbar spine continue to be positive for lumbar facet disease.  Post-Procedure Evaluation  Procedure (10/25/2019): Diagnostic/therapeutic bilateral lumbar facet block #3 under fluoroscopic guidance, and IV sedation Pre-procedure pain level:  6/10 Post-procedure: 0/10 (100% relief)  Sedation: Sedation provided.  Landis Martins, RN  11/10/2019  3:19 PM  Sign when Signing Visit Pain relief after procedure (treated area only): (Questions asked to patient) 1. Starting about 15 minutes after the procedure, and "while the area was still numb" (from the local anesthetics), were you having any of your usual pain "in that area" (the treated area)?  (NOTE: NOT including the discomfort from the needle sticks.) First 1 hour: 100 % better. First 4-6 hours: 100 % better. 2. How long did the numbness from the local anesthetics last? (More than 6 hours?) Duration: 6 hours.  3. How much better is your pain now, when compared to before the procedure? Current benefit: 30 % better. 4. Can you move better now? Improvement in ROM (Range of Motion): Yes. 5. Can you do more now? Improvement in function: Yes. 4. Did you have any problems with the procedure? Side-effects/Complications: No.  Current benefits: Defined as benefit that persist at this time.   Analgesia:  <50% better Function: Anita Gibbs reports improvement in function ROM: Anita Gibbs reports improvement in ROM   Medical Necessity: Anita Gibbs has experienced debilitating chronic pain from the Lumbosacral Facet Syndrome (Spondylosis without myelopathy or radiculopathy, lumbosacral region [M47.817]) that has  persisted for longer than three months of failed non-surgical care and has either failed to respond, or was unable to tolerate, or simply did not get enough benefit from other more conservative therapies including, but not limited to: 1. Over-the-counter oral analgesic medications (i.e.: ibuprofen, naproxen, etc.) 2. Anti-inflammatory medications 3. Muscle relaxants 4. Membrane stabilizers 5. Opioids 6. Home exercise program (HEP). 7. Modalities (Heat, ice, etc.) 8. Invasive techniques such as nerve blocks.  Anita Gibbs has attained greater than 50% reduction in pain from at least two (2) diagnostic medial branch blocks conducted in separate occasions. For this reason, I believe it is medically necessary to proceed with Non-Pulsed Radiofrequency Ablation for the purpose of attempting to prolong the duration of the benefits seen with the diagnostic injections.  Pharmacotherapy Assessment  Analgesic: Oxycodone IR 5 mg, 1 tab PO QOD (2.5 mg/day of oxycodone) (enough to last until 11/28/2019) MME/day:7.43m/day.   Monitoring: [place] PMP: PDMP reviewed during this encounter.       Pharmacotherapy: No side-effects or adverse reactions reported. Compliance: No problems identified. Effectiveness: Clinically acceptable. Plan: Refer to "POC".  UDS:  Summary  Date Value Ref Range Status  09/07/2018 FINAL  Final    Comment:    ==================================================================== TOXASSURE COMP DRUG ANALYSIS,UR ==================================================================== Test                             Result       Flag       Units Drug Present and Declared for Prescription Verification   Oxycodone                      220          EXPECTED   ng/mg creat  Oxymorphone                    65           EXPECTED   ng/mg creat   Noroxycodone                   363          EXPECTED   ng/mg creat    Sources of oxycodone include scheduled prescription medications.    Oxymorphone  and noroxycodone are expected metabolites of    oxycodone. Oxymorphone is also available as a scheduled    prescription medication.   Gabapentin                     PRESENT      EXPECTED   Methocarbamol                  PRESENT      EXPECTED   Diphenhydramine                PRESENT      EXPECTED Drug Absent but Declared for Prescription Verification   Acetaminophen                  Not Detected UNEXPECTED    Acetaminophen, as indicated in the declared medication list, is    not always detected even when used as directed.   Salicylate                     Not Detected UNEXPECTED    Aspirin, as indicated in the declared medication list, is not    always detected even when used as directed.   Naproxen                       Not Detected UNEXPECTED ==================================================================== Test                      Result    Flag   Units      Ref Range   Creatinine              207              mg/dL      >=20 ==================================================================== Declared Medications:  The flagging and interpretation on this report are based on the  following declared medications.  Unexpected results may arise from  inaccuracies in the declared medications.  **Note: The testing scope of this panel includes these medications:  Diphenhydramine (Benadryl)  Gabapentin  Methocarbamol (Robaxin)  Naproxen (Naprosyn)  Oxycodone  **Note: The testing scope of this panel does not include small to  moderate amounts of these reported medications:  Acetaminophen (Excedrin)  Aspirin (Excedrin)  **Note: The testing scope of this panel does not include following  reported medications:  Albuterol  Caffeine (Excedrin)  Epinephrine (EpiPen)  Famotidine (Pepcid)  Losartan (Cozaar)  Metronidazole (Flagyl) ==================================================================== For clinical consultation, please call [phone]. ====================================================================    Laboratory Chemistry Profile   Renal Lab Results  Component Value Date   BUN 14 10/10/2019   CREATININE 1.05 10/10/2019   BCR 13 09/07/2018   GFR 68.20 10/10/2019   GFRAA 76 09/07/2018   GFRNONAA 66 09/07/2018    Hepatic Lab Results  Component Value Date   AST 16 10/10/2019   ALT 19 10/10/2019   ALBUMIN 4.2 10/10/2019   ALKPHOS 41 10/10/2019    Electrolytes Lab Results  Component Value Date   NA 138 10/10/2019   K 3.9 10/10/2019   CL 104 10/10/2019   CALCIUM 9.4 10/10/2019   MG 2.2 09/07/2018    Bone Lab Results  Component Value Date   VD25OH 11.15 (L) 10/10/2019   25OHVITD1 8.1 (L) 09/07/2018   25OHVITD2 <1.0 09/07/2018   25OHVITD3 8.1 09/07/2018    Inflammation (CRP: Acute Phase) (ESR: Chronic Phase) Lab Results  Component Value Date   CRP 7 09/07/2018   ESRSEDRATE 67 (H) 09/07/2018      Note: Above Lab results reviewed.  Imaging  DG PAIN CLINIC C-ARM 1-60 MIN NO REPORT Fluoro was used, but no Radiologist interpretation will be provided.  Please refer to "NOTES" tab for provider progress note.      Assessment  The primary encounter diagnosis was Lumbar facet syndrome (Bilateral) (R>L). Diagnoses of Chronic low back pain (Tertiary Area of Pain) (Bilateral) (R>L) w/o sciatica, DDD (degenerative disc disease), lumbar, and Chronic pain syndrome were also pertinent to this visit.  Plan of Care  Problem-specific:  No problem-specific Assessment & Plan notes found for this encounter.  Anita Gibbs has a current medication list which includes the following long-term medication(s): albuterol, amlodipine, atorvastatin, cetirizine hcl, diphenhydramine, famotidine, fluticasone, gabapentin, losartan, methocarbamol, montelukast, norethindrone, oxycodone, [START ON 02/10/2020] oxycodone, and pantoprazole.  Pharmacotherapy (Medications Ordered): Meds ordered this encounter   Medications  . oxyCODONE (OXY IR/ROXICODONE) 5 MG immediate release tablet    Sig: Take 1 tablet (5 mg total) by mouth daily as needed for severe pain. Must last 30 days    Dispense:  15 tablet    Refill:  0    Chronic Pain: STOP Act (Not applicable) Fill 1 day early if closed on refill date. Do not fill until: 02/10/2020. To last until: 03/11/2020. Avoid benzodiazepines within 8 hours of opioids   Orders:  Orders Placed This Encounter  Procedures  . Radiofrequency,Lumbar    Standing Status:   Future    Standing Expiration Date:   05/16/2021    Scheduling Instructions:     Side(s): Left-sided     Level: L3-4, L4-5, & L5-S1 Facets (L2, L3, L4, L5, & S1 Medial Branch Nerves)     Sedation: With Sedation     Scheduling Timeframe: As soon as pre-approved    Order Specific Question:   Where will this procedure be performed?    Answer:   [facility]   Follow-up plan:   Return in about 4 months (around 03/07/2020) for (VV), (MM), in addition, RFA (w/ sedation): (L) L-FCT RFA #1, (ASAP).      Interventional management options:  Considering:   NOTE: Shellfish allergy Possible bilateral cervical facetRFA Diagnostic right L3-4 LESI Diagnostic bilateral L3 vs L4 TFESI  Possible bilateral lumbar facetRFA #1 Diagnostic right vs bilateral SI joint injection Possible right vs bilateral SI jointRFA   Palliative PRN treatment(s):   Diagnostic/therapeutic bilateral lumbar facet block #4  Palliative right CESI #4  Diagnostic bilateral cervicalfacet block #2    Recent Visits Date Type Provider Dept  10/25/19 Procedure visit Milinda Pointer, MD [facility]  Showing recent visits within past 90 days and meeting all other requirements   Today's Visits Date Type Provider Dept  11/14/19 Telemedicine Milinda Pointer, MD [facility]  Showing today's visits and meeting all other requirements   Future Appointments Date Type Provider Dept  12/07/19  Appointment Milinda Pointer, MD [facility]  Showing future appointments within next 90 days and  meeting all other requirements   I discussed the assessment and treatment plan with the patient. The patient was provided an opportunity to ask questions and all were answered. The patient agreed with the plan and demonstrated an understanding of the instructions.  Patient advised to call back or seek an in-person evaluation if the symptoms or condition worsens.  Duration of encounter: 16 minutes.  Note by: Gaspar Cola, MD Date: 11/14/2019; Time: 11:47 AM

## 2019-11-14 ENCOUNTER — Ambulatory Visit: Payer: BC Managed Care – PPO | Attending: Pain Medicine | Admitting: Pain Medicine

## 2019-11-14 ENCOUNTER — Other Ambulatory Visit: Payer: Self-pay

## 2019-11-14 DIAGNOSIS — M5136 Other intervertebral disc degeneration, lumbar region: Secondary | ICD-10-CM | POA: Diagnosis not present

## 2019-11-14 DIAGNOSIS — M545 Low back pain, unspecified: Secondary | ICD-10-CM

## 2019-11-14 DIAGNOSIS — G894 Chronic pain syndrome: Secondary | ICD-10-CM | POA: Diagnosis not present

## 2019-11-14 DIAGNOSIS — M47816 Spondylosis without myelopathy or radiculopathy, lumbar region: Secondary | ICD-10-CM | POA: Diagnosis not present

## 2019-11-14 DIAGNOSIS — G8929 Other chronic pain: Secondary | ICD-10-CM

## 2019-11-14 LAB — HM COLONOSCOPY

## 2019-11-14 MED ORDER — OXYCODONE HCL 5 MG PO TABS: 5.0000 mg | ORAL_TABLET | Freq: Every day | ORAL | 0 refills | Status: DC | PRN

## 2019-11-15 ENCOUNTER — Other Ambulatory Visit: Payer: Self-pay | Admitting: Pain Medicine

## 2019-11-15 ENCOUNTER — Telehealth: Payer: Self-pay | Admitting: *Deleted

## 2019-11-15 DIAGNOSIS — M5136 Other intervertebral disc degeneration, lumbar region: Secondary | ICD-10-CM | POA: Insufficient documentation

## 2019-11-15 DIAGNOSIS — R200 Anesthesia of skin: Secondary | ICD-10-CM

## 2019-11-15 DIAGNOSIS — R202 Paresthesia of skin: Secondary | ICD-10-CM

## 2019-11-15 DIAGNOSIS — M79605 Pain in left leg: Secondary | ICD-10-CM

## 2019-11-15 DIAGNOSIS — G8929 Other chronic pain: Secondary | ICD-10-CM

## 2019-11-15 DIAGNOSIS — M51369 Other intervertebral disc degeneration, lumbar region without mention of lumbar back pain or lower extremity pain: Secondary | ICD-10-CM

## 2019-11-15 DIAGNOSIS — M47816 Spondylosis without myelopathy or radiculopathy, lumbar region: Secondary | ICD-10-CM

## 2019-11-15 HISTORY — DX: Other intervertebral disc degeneration, lumbar region: M51.36

## 2019-11-15 HISTORY — DX: Other intervertebral disc degeneration, lumbar region without mention of lumbar back pain or lower extremity pain: M51.369

## 2019-11-17 LAB — HM COLONOSCOPY

## 2019-11-21 ENCOUNTER — Telehealth: Payer: Self-pay | Admitting: *Deleted

## 2019-11-21 ENCOUNTER — Telehealth: Payer: BC Managed Care – PPO | Admitting: Pain Medicine

## 2019-11-22 ENCOUNTER — Other Ambulatory Visit: Payer: Self-pay | Admitting: Internal Medicine

## 2019-11-22 ENCOUNTER — Telehealth: Payer: Self-pay | Admitting: Internal Medicine

## 2019-11-22 DIAGNOSIS — I10 Essential (primary) hypertension: Secondary | ICD-10-CM

## 2019-11-22 MED ORDER — AMLODIPINE BESYLATE 5 MG PO TABS: 5.0000 mg | ORAL_TABLET | Freq: Every day | ORAL | 3 refills | Status: DC

## 2019-11-22 NOTE — Telephone Encounter (Signed)
Last sent in by Guse. Okay to send?

## 2019-11-22 NOTE — Telephone Encounter (Signed)
Pt needs a refill on amLODipine (NORVASC) 5 MG tablet sent to [facility]

## 2019-12-01 ENCOUNTER — Encounter: Payer: Self-pay | Admitting: Internal Medicine

## 2019-12-01 ENCOUNTER — Other Ambulatory Visit: Payer: Self-pay

## 2019-12-06 ENCOUNTER — Ambulatory Visit: Payer: BC Managed Care – PPO | Admitting: Internal Medicine

## 2019-12-06 ENCOUNTER — Other Ambulatory Visit: Payer: Self-pay

## 2019-12-06 ENCOUNTER — Ambulatory Visit (INDEPENDENT_AMBULATORY_CARE_PROVIDER_SITE_OTHER): Payer: BC Managed Care – PPO

## 2019-12-06 ENCOUNTER — Ambulatory Visit: Payer: BC Managed Care – PPO

## 2019-12-06 ENCOUNTER — Encounter: Payer: Self-pay | Admitting: Internal Medicine

## 2019-12-06 VITALS — BP 140/84 | HR 89 | Temp 97.5°F | Ht 63.0 in | Wt 253.0 lb

## 2019-12-06 DIAGNOSIS — I10 Essential (primary) hypertension: Secondary | ICD-10-CM | POA: Diagnosis not present

## 2019-12-06 DIAGNOSIS — M79671 Pain in right foot: Secondary | ICD-10-CM

## 2019-12-06 DIAGNOSIS — M79672 Pain in left foot: Secondary | ICD-10-CM

## 2019-12-06 MED ORDER — HYDROCHLOROTHIAZIDE 25 MG PO TABS: 12.5000 mg | ORAL_TABLET | Freq: Every day | ORAL | 3 refills | Status: DC

## 2019-12-06 NOTE — Patient Instructions (Addendum)
Ice feet and voltaren gel up to 4x per day  Blood pressure goal <130/<80    Foot Pain Many things can cause foot pain. Some common causes are:  An injury.  A sprain.  Arthritis.  Blisters.  Bunions. Follow these instructions at home: Managing pain, stiffness, and swelling If directed, put ice on the painful area:  Put ice in a plastic bag.  Place a towel between your skin and the bag.  Leave the ice on for 20 minutes, 2-3 times a day.  Activity  Do not stand or walk for long periods.  Return to your normal activities as told by your health care provider. Ask your health care provider what activities are safe for you.  Do stretches to relieve foot pain and stiffness as told by your health care provider.  Do not lift anything that is heavier than 10 lb (4.5 kg), or the limit that you are told, until your health care provider says that it is safe. Lifting a lot of weight can put added pressure on your feet. Lifestyle  Wear comfortable, supportive shoes that fit you well. Do not wear high heels.  Keep your feet clean and dry. General instructions  Take over-the-counter and prescription medicines only as told by your health care provider.  Rub your foot gently.  Pay attention to any changes in your symptoms.  Keep all follow-up visits as told by your health care provider. This is important. Contact a health care provider if:  Your pain does not get better after a few days of self-care.  Your pain gets worse.  You cannot stand on your foot. Get help right away if:  Your foot is numb or tingling.  Your foot or toes are swollen.  Your foot or toes turn white or blue.  You have warmth and redness along your foot. Summary  Common causes of foot pain are injury, sprain, arthritis, blisters or bunions.  Ice, medicines, and comfortable shoes may help foot pain.  Contact your health care provider if your pain does not get better after a few days of self-care.  This information is not intended to replace advice given to you by your health care provider. Make sure you discuss any questions you have with your health care provider. Document Revised: 06/03/2018 Document Reviewed: 06/03/2018 Elsevier Patient Education  [place] DASH stands for "Dietary Approaches to Stop Hypertension." The DASH eating plan is a healthy eating plan that has been shown to reduce high blood pressure (hypertension). It may also reduce your risk for type 2 diabetes, heart disease, and stroke. The DASH eating plan may also help with weight loss. What are tips for following this plan?  General guidelines  Avoid eating more than 2,300 mg (milligrams) of salt (sodium) a day. If you have hypertension, you may need to reduce your sodium intake to 1,500 mg a day.  Limit alcohol intake to no more than 1 drink a day for nonpregnant women and 2 drinks a day for men. One drink equals 12 oz of beer, 5 oz of wine, or 1 oz of hard liquor.  Work with your health care provider to maintain a healthy body weight or to lose weight. Ask what an ideal weight is for you.  Get at least 30 minutes of exercise that causes your heart to beat faster (aerobic exercise) most days of the week. Activities may include walking, swimming, or biking.  Work with your health care provider or diet and  nutrition specialist (dietitian) to adjust your eating plan to your individual calorie needs. Reading food labels   Check food labels for the amount of sodium per serving. Choose foods with less than 5 percent of the Daily Value of sodium. Generally, foods with less than 300 mg of sodium per serving fit into this eating plan.  To find whole grains, look for the word "whole" as the first word in the ingredient list. Shopping  Buy products labeled as "low-sodium" or "no salt added."  Buy fresh foods. Avoid canned foods and premade or frozen meals. Cooking  Avoid adding salt when  cooking. Use salt-free seasonings or herbs instead of table salt or sea salt. Check with your health care provider or pharmacist before using salt substitutes.  Do not fry foods. Cook foods using healthy methods such as baking, boiling, grilling, and broiling instead.  Cook with heart-healthy oils, such as olive, canola, soybean, or sunflower oil. Meal planning  Eat a balanced diet that includes: ? 5 or more servings of fruits and vegetables each day. At each meal, try to fill half of your plate with fruits and vegetables. ? Up to 6-8 servings of whole grains each day. ? Less than 6 oz of lean meat, poultry, or fish each day. A 3-oz serving of meat is about the same size as a deck of cards. One egg equals 1 oz. ? 2 servings of low-fat dairy each day. ? A serving of nuts, seeds, or beans 5 times each week. ? Heart-healthy fats. Healthy fats called Omega-3 fatty acids are found in foods such as flaxseeds and coldwater fish, like sardines, salmon, and mackerel.  Limit how much you eat of the following: ? Canned or prepackaged foods. ? Food that is high in trans fat, such as fried foods. ? Food that is high in saturated fat, such as fatty meat. ? Sweets, desserts, sugary drinks, and other foods with added sugar. ? Full-fat dairy products.  Do not salt foods before eating.  Try to eat at least 2 vegetarian meals each week.  Eat more home-cooked food and less restaurant, buffet, and fast food.  When eating at a restaurant, ask that your food be prepared with less salt or no salt, if possible. What foods are recommended? The items listed may not be a complete list. Talk with your dietitian about what dietary choices are best for you. Grains Whole-grain or whole-wheat bread. Whole-grain or whole-wheat pasta. Brown rice. [place]. Bulgur. Whole-grain and low-sodium cereals. Pita bread. Low-fat, low-sodium crackers. Whole-wheat flour tortillas. Vegetables Fresh or frozen vegetables  (raw, steamed, roasted, or grilled). Low-sodium or reduced-sodium tomato and vegetable juice. Low-sodium or reduced-sodium tomato sauce and tomato paste. Low-sodium or reduced-sodium canned vegetables. Fruits All fresh, dried, or frozen fruit. Canned fruit in natural juice (without added sugar). Meat and other protein foods Skinless chicken or [place]. Ground chicken or [place]. Pork with fat trimmed off. Fish and seafood. Egg whites. Dried beans, peas, or lentils. Unsalted nuts, nut butters, and seeds. Unsalted canned beans. Lean cuts of beef with fat trimmed off. Low-sodium, lean deli meat. Dairy Low-fat (1%) or fat-free (skim) milk. Fat-free, low-fat, or reduced-fat cheeses. Nonfat, low-sodium ricotta or cottage cheese. Low-fat or nonfat yogurt. Low-fat, low-sodium cheese. Fats and oils Soft margarine without trans fats. Vegetable oil. Low-fat, reduced-fat, or light mayonnaise and salad dressings (reduced-sodium). Canola, safflower, olive, soybean, and sunflower oils. Avocado. Seasoning and other foods Herbs. Spices. Seasoning mixes without salt. Unsalted popcorn and pretzels. Fat-free sweets. What  foods are not recommended? The items listed may not be a complete list. Talk with your dietitian about what dietary choices are best for you. Grains Baked goods made with fat, such as croissants, muffins, or some breads. Dry pasta or rice meal packs. Vegetables Creamed or fried vegetables. Vegetables in a cheese sauce. Regular canned vegetables (not low-sodium or reduced-sodium). Regular canned tomato sauce and paste (not low-sodium or reduced-sodium). Regular tomato and vegetable juice (not low-sodium or reduced-sodium). Angie Fava. Olives. Fruits Canned fruit in a light or heavy syrup. Fried fruit. Fruit in cream or butter sauce. Meat and other protein foods Fatty cuts of meat. Ribs. Fried meat. Berniece Salines. Sausage. Bologna and other processed lunch meats. Salami. Fatback. Hotdogs. Bratwurst. Salted nuts  and seeds. Canned beans with added salt. Canned or smoked fish. Whole eggs or egg yolks. Chicken or [place] with skin. Dairy Whole or 2% milk, cream, and half-and-half. Whole or full-fat cream cheese. Whole-fat or sweetened yogurt. Full-fat cheese. Nondairy creamers. Whipped toppings. Processed cheese and cheese spreads. Fats and oils Butter. Stick margarine. Lard. Shortening. Ghee. Bacon fat. Tropical oils, such as coconut, palm kernel, or palm oil. Seasoning and other foods Salted popcorn and pretzels. Onion salt, garlic salt, seasoned salt, table salt, and sea salt. Worcestershire sauce. Tartar sauce. Barbecue sauce. Teriyaki sauce. Soy sauce, including reduced-sodium. Steak sauce. Canned and packaged gravies. Fish sauce. Oyster sauce. Cocktail sauce. Horseradish that you find on the shelf. Ketchup. Mustard. Meat flavorings and tenderizers. Bouillon cubes. Hot sauce and Tabasco sauce. Premade or packaged marinades. Premade or packaged taco seasonings. Relishes. Regular salad dressings. Where to find more information:  National Heart, Lung, and [place]: https://wilson-eaton.com/  American Heart Association: www.heart.org Summary  The DASH eating plan is a healthy eating plan that has been shown to reduce high blood pressure (hypertension). It may also reduce your risk for type 2 diabetes, heart disease, and stroke.  With the DASH eating plan, you should limit salt (sodium) intake to 2,300 mg a day. If you have hypertension, you may need to reduce your sodium intake to 1,500 mg a day.  When on the DASH eating plan, aim to eat more fresh fruits and vegetables, whole grains, lean proteins, low-fat dairy, and heart-healthy fats.  Work with your health care provider or diet and nutrition specialist (dietitian) to adjust your eating plan to your individual calorie needs. This information is not intended to replace advice given to you by your health care provider. Make sure you discuss any questions  you have with your health care provider. Document Revised: 07/31/2017 Document Reviewed: 08/11/2016 Elsevier Patient Education  [place].  Plantar Fasciitis  Plantar fasciitis is a painful foot condition that affects the heel. It occurs when the band of tissue that connects the toes to the heel bone (plantar fascia) becomes irritated. This can happen as the result of exercising too much or doing other repetitive activities (overuse injury). The pain from plantar fasciitis can range from mild irritation to severe pain that makes it difficult to walk or move. The pain is usually worse in the morning after sleeping, or after sitting or lying down for a while. Pain may also be worse after long periods of walking or standing. What are the causes? This condition may be caused by:  Standing for long periods of time.  Wearing shoes that do not have good arch support.  Doing activities that put stress on joints (high-impact activities), including running, aerobics, and ballet.  Being overweight.  An abnormal  way of walking (gait).  Tight muscles in the back of your lower leg (calf).  High arches in your feet.  Starting a new athletic activity. What are the signs or symptoms? The main symptom of this condition is heel pain. Pain may:  Be worse with first steps after a time of rest, especially in the morning after sleeping or after you have been sitting or lying down for a while.  Be worse after long periods of standing still.  Decrease after 30-45 minutes of activity, such as gentle walking. How is this diagnosed? This condition may be diagnosed based on your medical history and your symptoms. Your health care provider may ask questions about your activity level. Your health care provider will do a physical exam to check for:  A tender area on the bottom of your foot.  A high arch in your foot.  Pain when you move your foot.  Difficulty moving your foot. You may have  imaging tests to confirm the diagnosis, such as:  X-rays.  Ultrasound.  MRI. How is this treated? Treatment for plantar fasciitis depends on how severe your condition is. Treatment may include:  Rest, ice, applying pressure (compression), and raising the affected foot (elevation). This may be called RICE therapy. Your health care provider may recommend RICE therapy along with over-the-counter pain medicines to manage your pain.  Exercises to stretch your calves and your plantar fascia.  A splint that holds your foot in a stretched, upward position while you sleep (night splint).  Physical therapy to relieve symptoms and prevent problems in the future.  Injections of steroid medicine (cortisone) to relieve pain and inflammation.  Stimulating your plantar fascia with electrical impulses (extracorporeal shock wave therapy). This is usually the last treatment option before surgery.  Surgery, if other treatments have not worked after 12 months. Follow these instructions at home:  Managing pain, stiffness, and swelling  If directed, put ice on the painful area: ? Put ice in a plastic bag, or use a frozen bottle of water. ? Place a towel between your skin and the bag or bottle. ? Roll the bottom of your foot over the bag or bottle. ? Do this for 20 minutes, 2-3 times a day.  Wear athletic shoes that have air-sole or gel-sole cushions, or try wearing soft shoe inserts that are designed for plantar fasciitis.  Raise (elevate) your foot above the level of your heart while you are sitting or lying down. Activity  Avoid activities that cause pain. Ask your health care provider what activities are safe for you.  Do physical therapy exercises and stretches as told by your health care provider.  Try activities and forms of exercise that are easier on your joints (low-impact). Examples include swimming, water aerobics, and biking. General instructions  Take over-the-counter and  prescription medicines only as told by your health care provider.  Wear a night splint while sleeping, if told by your health care provider. Loosen the splint if your toes tingle, become numb, or turn cold and blue.  Maintain a healthy weight, or work with your health care provider to lose weight as needed.  Keep all follow-up visits as told by your health care provider. This is important. Contact a health care provider if you:  Have symptoms that do not go away after caring for yourself at home.  Have pain that gets worse.  Have pain that affects your ability to move or do your daily activities. Summary  Plantar fasciitis is a  painful foot condition that affects the heel. It occurs when the band of tissue that connects the toes to the heel bone (plantar fascia) becomes irritated.  The main symptom of this condition is heel pain that may be worse after exercising too much or standing still for a long time.  Treatment varies, but it usually starts with rest, ice, compression, and elevation (RICE therapy) and over-the-counter medicines to manage pain. This information is not intended to replace advice given to you by your health care provider. Make sure you discuss any questions you have with your health care provider. Document Revised: 07/31/2017 Document Reviewed: 06/15/2017 Elsevier Patient Education  2020 [employer].

## 2019-12-06 NOTE — Progress Notes (Signed)
Chief Complaint  Patient presents with  . Follow-up  . Foot Problem    left foot swelling, bilateral foot pain, right foot has turned darker in color. Difficulty walking due to feet pain. 7/10 pain score  . Referral    referral to podiatry   F/u  1. C/o left foot swelling at times and pain with walking worse right heel >left and top of right foot color turning dark. 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 htn on losartan 100 mg qd and norvasc 5 mg qd   Review of Systems  Constitutional: Negative for weight loss.  HENT: Negative for hearing loss.   Eyes: Negative for blurred vision.  Respiratory: Negative for shortness of breath.   Cardiovascular: Negative for chest pain.  Gastrointestinal: Negative for abdominal pain.  Musculoskeletal: Positive for joint pain.  Skin: Negative for rash.  Neurological: Negative for headaches.  Psychiatric/Behavioral: Negative for depression.   Past Medical History:  Diagnosis Date  . Arthritis   . Carpal boss, right   . Carpal tunnel syndrome, bilateral   . Headache   . Hypertension   . Seasonal asthma 11/29/2018   Past Surgical History:  Procedure Laterality Date  . ANTERIOR CERVICAL DECOMP/DISCECTOMY FUSION N/A 07/01/2017   Procedure: ANTERIOR CERVICAL DECOMPRESSION/DISCECTOMY FUSION 3 LEVELS;  Surgeon: Meade Maw, MD;  Location: [facility] ORS;  Service: Neurosurgery;  Laterality: N/A;  . KNEE ARTHROSCOPY Right    Family History  Problem Relation Age of Onset  . [name] Mother   . Diabetes Mother   . Arthritis Mother   . Hypertension Mother   . Hyperlipidemia Mother   . Hypertension Father   . Arthritis Father   . Hearing loss Father   . Kidney disease Father   . Lymphoma Father   . Arthritis Sister   . Hypertension Sister   . Cancer Brother        ? lung due to exposure   . COPD Brother   . Early death Brother   . COPD Son   . Depression Son   . Breast cancer Maternal Aunt    . Breast cancer Paternal Aunt   . Allergic rhinitis Neg Hx   . Angioedema Neg Hx   . Eczema Neg Hx   . Urticaria Neg Hx   . Asthma Neg Hx    Social History   Socioeconomic History  . Marital status: Widowed    Spouse name: Not on file  . Number of children: Not on file  . Years of education: Not on file  . Highest education level: Not on file  Occupational History  . Not on file  Tobacco Use  . Smoking status: Never Smoker  . Smokeless tobacco: Never Used  Substance and Sexual Activity  . Alcohol use: Yes    Comment: occ  . Drug use: No  . Sexual activity: Yes    Birth control/protection: None  Other Topics Concern  . Not on file  Social History Narrative   Bus driver    And school custodian    Social Determinants of Health   Financial Resource Strain:   . Difficulty of Paying Living Expenses:   Food Insecurity:   . Worried About Charity fundraiser in the Last Year:   . Arboriculturist in the Last Year:   Transportation Needs:   . Film/video editor (Medical):   Marland Kitchen Lack of Transportation (Non-Medical):   Physical Activity:   .  Days of Exercise per Week:   . Minutes of Exercise per Session:   Stress:   . Feeling of Stress :   Social Connections:   . Frequency of Communication with Friends and Family:   . Frequency of Social Gatherings with Friends and Family:   . Attends Religious Services:   . Active Member of Clubs or Organizations:   . Attends Archivist Meetings:   Marland Kitchen Marital Status:   Intimate Partner Violence:   . Fear of Current or Ex-Partner:   . Emotionally Abused:   Marland Kitchen Physically Abused:   . Sexually Abused:    Current Meds  Medication Sig  . albuterol (PROVENTIL HFA;VENTOLIN HFA) 108 (90 Base) MCG/ACT inhaler Inhale 2 puffs into the lungs every 6 (six) hours as needed for wheezing or shortness of breath.  Marland Kitchen amLODipine (NORVASC) 5 MG tablet Take 1 tablet (5 mg total) by mouth daily.  Marland Kitchen atorvastatin (LIPITOR) 10 MG tablet Take 1  tablet (10 mg total) by mouth daily at 6 PM.  . Cetirizine HCl (ZYRTEC PO) Take 25 mg by mouth.  . Cholecalciferol (VITAMIN D-3) 125 MCG (5000 UT) TABS Take by mouth daily.  . diphenhydrAMINE (BENADRYL) 25 mg capsule Take 25 mg by mouth every 6 (six) hours as needed (for allergies.).  Marland Kitchen EPINEPHrine (AUVI-Q) 0.3 mg/0.3 mL IJ SOAJ injection Use as directed for severe allergic reaction  . fluticasone (FLONASE) 50 MCG/ACT nasal spray Place 2 sprays into both nostrils daily.  Marland Kitchen gabapentin (NEURONTIN) 300 MG capsule Take 1-2 capsules (300-600 mg total) by mouth 3 (three) times daily.  Marland Kitchen losartan (COZAAR) 100 MG tablet Take 1 tablet (100 mg total) by mouth daily.  . methocarbamol (ROBAXIN) 750 MG tablet Take 1 tablet (750 mg total) by mouth every 8 (eight) hours as needed for muscle spasms.  . montelukast (SINGULAIR) 10 MG tablet Take 1 tablet (10 mg total) by mouth at bedtime.  . norethindrone (AYGESTIN) 5 MG tablet 2 (two) times daily.   Marland Kitchen oxyCODONE (OXY IR/ROXICODONE) 5 MG immediate release tablet Take 1 tablet (5 mg total) by mouth daily as needed for severe pain. Must last 30 days  . [START ON 02/10/2020] oxyCODONE (OXY IR/ROXICODONE) 5 MG immediate release tablet Take 1 tablet (5 mg total) by mouth daily as needed for severe pain. Must last 30 days  . pantoprazole (PROTONIX) 40 MG tablet Take 1 tablet (40 mg total) by mouth daily. 30 min. Before dinner   Allergies  Allergen Reactions  . Other Hives    White and red sauces  . Shellfish Allergy Hives  . Strawberry (Diagnostic) Hives  . Tomato Hives    cherry   Recent Results (from the past 2160 hour(s))  HgB A1c     Status: None   Collection Time: 10/10/19  9:00 AM  Result Value Ref Range   Hgb A1c MFr Bld 6.1 4.6 - 6.5 %    Comment: Glycemic Control Guidelines for People with Diabetes:Non Diabetic:  <6%Goal of Therapy: <7%Additional Action Suggested:  >8%   Vitamin D (25 hydroxy)     Status: Abnormal   Collection Time: 10/10/19  9:00 AM   Result Value Ref Range   VITD 11.15 (L) 30.00 - 100.00 ng/mL  Urinalysis, Routine w reflex microscopic     Status: Abnormal   Collection Time: 10/10/19  9:00 AM  Result Value Ref Range   Color, Urine YELLOW YELLOW   APPearance CLOUDY (A) CLEAR   Specific Gravity, Urine 1.026 1.001 - 1.03  pH < OR = 5.0 5.0 - 8.0   Glucose, UA NEGATIVE NEGATIVE   Bilirubin Urine NEGATIVE NEGATIVE   Ketones, ur NEGATIVE NEGATIVE   Hgb urine dipstick 2+ (A) NEGATIVE   Protein, ur 1+ (A) NEGATIVE   Nitrite NEGATIVE NEGATIVE   Leukocytes,Ua NEGATIVE NEGATIVE   WBC, UA NONE SEEN 0 - 5 /HPF   RBC / HPF 0-2 0 - 2 /HPF   Squamous Epithelial / LPF 0-5 < OR = 5 /HPF   Bacteria, UA NONE SEEN NONE SEEN /HPF   Hyaline Cast NONE SEEN NONE SEEN /LPF  TSH     Status: None   Collection Time: 10/10/19  9:00 AM  Result Value Ref Range   TSH 3.33 0.35 - 4.50 uIU/mL  Lipid panel     Status: Abnormal   Collection Time: 10/10/19  9:00 AM  Result Value Ref Range   Cholesterol 227 (H) 0 - 200 mg/dL    Comment: ATP III Classification       Desirable:  < 200 mg/dL               Borderline High:  200 - 239 mg/dL          High:  > = 240 mg/dL   Triglycerides 77.0 0.0 - 149.0 mg/dL    Comment: Normal:  <150 mg/dLBorderline High:  150 - 199 mg/dL   HDL 41.10 >39.00 mg/dL   VLDL 15.4 0.0 - 40.0 mg/dL   LDL Cholesterol 171 (H) 0 - 99 mg/dL   Total CHOL/HDL Ratio 6     Comment:                Men          Women1/2 Average Risk     3.4          3.3Average Risk          5.0          4.42X Average Risk          9.6          7.13X Average Risk          15.0          11.0                       NonHDL 186.26     Comment: NOTE:  Non-HDL goal should be 30 mg/dL higher than patient's LDL goal (i.e. LDL goal of < 70 mg/dL, would have non-HDL goal of < 100 mg/dL)  CBC with Differential/Platelet     Status: Abnormal   Collection Time: 10/10/19  9:00 AM  Result Value Ref Range   WBC 6.8 4.0 - 10.5 K/uL   RBC 3.92 3.87 - 5.11 Mil/uL    Hemoglobin 13.3 12.0 - 15.0 g/dL   HCT 40.2 36.0 - 46.0 %   MCV 102.5 (H) 78.0 - 100.0 fl   MCHC 33.2 30.0 - 36.0 g/dL   RDW 14.0 11.5 - 15.5 %   Platelets 371.0 150.0 - 400.0 K/uL   Neutrophils Relative % 45.1 43.0 - 77.0 %   Lymphocytes Relative 47.1 (H) 12.0 - 46.0 %   Monocytes Relative 5.4 3.0 - 12.0 %   Eosinophils Relative 1.8 0.0 - 5.0 %   Basophils Relative 0.6 0.0 - 3.0 %   Neutro Abs 3.1 1.4 - 7.7 K/uL   Lymphs Abs 3.2 0.7 - 4.0 K/uL   Monocytes Absolute 0.4 0.1 - 1.0 K/uL  Eosinophils Absolute 0.1 0.0 - 0.7 K/uL   Basophils Absolute 0.0 0.0 - 0.1 K/uL  Comprehensive metabolic panel     Status: Abnormal   Collection Time: 10/10/19  9:00 AM  Result Value Ref Range   Sodium 138 135 - 145 mEq/L   Potassium 3.9 3.5 - 5.1 mEq/L   Chloride 104 96 - 112 mEq/L   CO2 23 19 - 32 mEq/L   Glucose, Bld 111 (H) 70 - 99 mg/dL   BUN 14 6 - 23 mg/dL   Creatinine, Ser 1.05 0.40 - 1.20 mg/dL   Total Bilirubin 0.5 0.2 - 1.2 mg/dL   Alkaline Phosphatase 41 39 - 117 U/L   AST 16 0 - 37 U/L   ALT 19 0 - 35 U/L   Total Protein 7.7 6.0 - 8.3 g/dL   Albumin 4.2 3.5 - 5.2 g/dL   GFR 68.20 >60.00 mL/min   Calcium 9.4 8.4 - 10.5 mg/dL  Urine Culture     Status: None   Collection Time: 10/10/19  9:00 AM   Specimen: Urine  Result Value Ref Range   MICRO NUMBER: [id number]    SPECIMEN QUALITY: Adequate    Sample Source URINE    STATUS: FINAL    ISOLATE 1:      Less than 10,000 CFU/mL of single Gram positive organism isolated. No further testing will be performed. If clinically indicated, recollection using a method to minimize contamination, with prompt transfer to Urine Culture Transport Tube, is recommended.  HM COLONOSCOPY     Status: None   Collection Time: 11/14/19 12:00 AM  Result Value Ref Range   HM Colonoscopy See Report (in chart) See Report (in chart), Patient Reported    Comment: mild gerd, hyperplastic polyp f/u in 5 yar Dr. Alice Reichert   HM COLONOSCOPY     Status: None    Collection Time: 11/17/19 12:00 AM  Result Value Ref Range   HM Colonoscopy See Report (in chart) See Report (in chart), Patient Reported    Comment: Repeat 5 years   Objective  Body mass index is 44.82 kg/m. Wt Readings from Last 3 Encounters:  12/06/19 253 lb (114.8 kg)  10/25/19 250 lb (113.4 kg)  09/06/19 245 lb (111.1 kg)   Temp Readings from Last 3 Encounters:  12/06/19 (!) 97.5 F (36.4 C) (Temporal)  10/25/19 98 F (36.7 C)  07/21/19 97.6 F (36.4 C)   BP Readings from Last 3 Encounters:  12/06/19 140/84  10/25/19 116/63  07/21/19 139/68   Pulse Readings from Last 3 Encounters:  12/06/19 89  10/25/19 69  07/21/19 90    Physical Exam Vitals and nursing note reviewed.  Constitutional:      Appearance: Normal appearance. She is well-developed and well-groomed. She is morbidly obese.  HENT:     Head: Normocephalic and atraumatic.  Eyes:     Conjunctiva/sclera: Conjunctivae normal.     Pupils: Pupils are equal, round, and reactive to light.  Cardiovascular:     Rate and Rhythm: Normal rate and regular rhythm.     Heart sounds: Normal heart sounds. No murmur.  Pulmonary:     Effort: Pulmonary effort is normal.     Breath sounds: Normal breath sounds.  Musculoskeletal:       Feet:  Skin:    General: Skin is warm and dry.  Neurological:     General: No focal deficit present.     Mental Status: She is alert and oriented to person, place, and time. Mental  status is at baseline.     Gait: Gait normal.  Psychiatric:        Attention and Perception: Attention and perception normal.        Mood and Affect: Mood and affect normal.        Speech: Speech normal.        Behavior: Behavior normal. Behavior is cooperative.        Thought Content: Thought content normal.        Cognition and Memory: Cognition and memory normal.        Judgment: Judgment normal.     Assessment  Plan  Pain in both feet left top of foot and right heel - Plan: DG Foot Complete  Right, DG Foot Complete Left Rx compression stockings    Essential hypertension - Plan: hydrochlorothiazide (HYDRODIURIL) 12.5 MG tablet in am  Cont other meds  Goal BP <130/<80  HM Flu shot utd  Tdap ask at f/u  mammo 09/30/19 normal   2021 colonoscopy - GERD hyperplastic polyp f/u in 5 years   02/26/18 neg neg hpv h/o abnl h/o fibroids Dr.Song  Provider: Dr. Olivia Mackie McLean-Scocuzza-Internal Medicine

## 2019-12-07 ENCOUNTER — Telehealth: Payer: BC Managed Care – PPO | Admitting: Pain Medicine

## 2019-12-23 ENCOUNTER — Ambulatory Visit: Payer: BC Managed Care – PPO | Admitting: Podiatry

## 2019-12-23 ENCOUNTER — Other Ambulatory Visit: Payer: Self-pay

## 2019-12-23 ENCOUNTER — Encounter: Payer: Self-pay | Admitting: Podiatry

## 2019-12-23 VITALS — Temp 97.5°F

## 2019-12-23 DIAGNOSIS — G5791 Unspecified mononeuropathy of right lower limb: Secondary | ICD-10-CM

## 2019-12-23 DIAGNOSIS — M5416 Radiculopathy, lumbar region: Secondary | ICD-10-CM | POA: Diagnosis not present

## 2019-12-23 DIAGNOSIS — R600 Localized edema: Secondary | ICD-10-CM

## 2019-12-23 DIAGNOSIS — M722 Plantar fascial fibromatosis: Secondary | ICD-10-CM | POA: Diagnosis not present

## 2019-12-23 MED ORDER — MELOXICAM 15 MG PO TABS: 15.0000 mg | ORAL_TABLET | Freq: Every day | ORAL | 1 refills | Status: DC

## 2019-12-27 NOTE — Progress Notes (Signed)
   Subjective: 49 y.o. female presenting today as a new patient with a chief complaint of tenderness and pins and needles sensation to the bilateral heels that began one year ago. She reports swelling and a nodule in the arch of the left foot. She states the pain is constant but worsens with movement and walking. She has been using ice therapy and taking pain medication for treatment. Patient is here for further evaluation and treatment.   Past Medical History:  Diagnosis Date  . Arthritis   . Carpal boss, right   . Carpal tunnel syndrome, bilateral   . Headache   . Hypertension   . Seasonal asthma 11/29/2018     Objective: Physical Exam General: The patient is alert and oriented x3 in no acute distress.  Dermatology: Skin is warm, dry and supple bilateral lower extremities. Negative for open lesions or macerations bilateral.   Vascular: Edema noted to the left foot. Dorsalis Pedis and Posterior Tibial pulses palpable bilateral. Capillary fill time is immediate to all digits.  Neurological: Epicritic and protective threshold diminished bilateral.   Musculoskeletal: Tenderness to palpation to the plantar aspect of the bilateral heels along the plantar fascia. All other joints range of motion within normal limits bilateral. Strength 5/5 in all groups bilateral.    Assessment: 1. plantar fasciitis right 2. Plantar fasciitis left - midfoot  3. Lumbar radiculopathy RLE 4. Peripheral neuropathy RLE 5. Edema left foot   Plan of Care:  1. Patient evaluated. Xrays from 12/07/2019 in Epic reviewed.   2. Injection of 0.5cc Celestone soluspan injected into the bilateral heels.  3. Compression anklet dispensed.  4. Rx for Meloxicam ordered for patient. 5. Return to clinic in 4 weeks.   Bus driver and custodian.    Edrick Kins, DPM [facility]  Dr. Edrick Kins, DPM    [address], [place] [address]                Office  [phone]  Fax [phone]

## 2020-01-17 ENCOUNTER — Encounter: Payer: Self-pay | Admitting: Internal Medicine

## 2020-01-17 ENCOUNTER — Ambulatory Visit (INDEPENDENT_AMBULATORY_CARE_PROVIDER_SITE_OTHER): Payer: BC Managed Care – PPO | Admitting: Internal Medicine

## 2020-01-17 ENCOUNTER — Other Ambulatory Visit: Payer: Self-pay

## 2020-01-17 VITALS — BP 122/76 | HR 77 | Temp 97.7°F | Ht 63.0 in | Wt 245.1 lb

## 2020-01-17 DIAGNOSIS — Z9109 Other allergy status, other than to drugs and biological substances: Secondary | ICD-10-CM

## 2020-01-17 DIAGNOSIS — E785 Hyperlipidemia, unspecified: Secondary | ICD-10-CM

## 2020-01-17 DIAGNOSIS — E559 Vitamin D deficiency, unspecified: Secondary | ICD-10-CM

## 2020-01-17 DIAGNOSIS — G4733 Obstructive sleep apnea (adult) (pediatric): Secondary | ICD-10-CM | POA: Diagnosis not present

## 2020-01-17 DIAGNOSIS — R0683 Snoring: Secondary | ICD-10-CM | POA: Diagnosis not present

## 2020-01-17 DIAGNOSIS — I10 Essential (primary) hypertension: Secondary | ICD-10-CM

## 2020-01-17 DIAGNOSIS — R7303 Prediabetes: Secondary | ICD-10-CM

## 2020-01-17 DIAGNOSIS — G4719 Other hypersomnia: Secondary | ICD-10-CM | POA: Diagnosis not present

## 2020-01-17 HISTORY — DX: Other allergy status, other than to drugs and biological substances: Z91.09

## 2020-01-17 MED ORDER — FLUTICASONE PROPIONATE 50 MCG/ACT NA SUSP: 2.0000 | Freq: Every day | NASAL | 6 refills | Status: DC

## 2020-01-17 NOTE — Progress Notes (Signed)
Chief Complaint  Patient presents with  . Fatigue    constant low energy + sleeping all day. Pt sleeps 6-8 hors every night. No known history of vitamin deficiency.    F/u  1. Increased daytime sleepiness though sleeping from 6:30 pm to 4 am. She is snoring onset 1 month  Ago nothing changed about her meds on chronic pain oxycodone and gabapentin 600 mg bid x 1.5 years per pain clinic  2. HTN controlled on norvasc 5 mg qd and losartan 100 mg qd and hctz 25 mg qd  3. Vitamin d Def will repeat lab upcoming  Review of Systems  Respiratory: Negative for shortness of breath.   Cardiovascular: Negative for chest pain.  Neurological:       +daytime fatigue and snoring   Psychiatric/Behavioral: The patient does not have insomnia.        +daytime fatigue and snoring    Past Medical History:  Diagnosis Date  . Arthritis   . Carpal boss, right   . Carpal tunnel syndrome, bilateral   . Headache   . Hypertension   . Seasonal asthma 11/29/2018   Past Surgical History:  Procedure Laterality Date  . ANTERIOR CERVICAL DECOMP/DISCECTOMY FUSION N/A 07/01/2017   Procedure: ANTERIOR CERVICAL DECOMPRESSION/DISCECTOMY FUSION 3 LEVELS;  Surgeon: Meade Maw, MD;  Location: [facility] ORS;  Service: Neurosurgery;  Laterality: N/A;  . KNEE ARTHROSCOPY Right    Family History  Problem Relation Age of Onset  . [name] Mother   . Diabetes Mother   . Arthritis Mother   . Hypertension Mother   . Hyperlipidemia Mother   . Hypertension Father   . Arthritis Father   . Hearing loss Father   . Kidney disease Father   . Lymphoma Father   . Arthritis Sister   . Hypertension Sister   . Cancer Brother        ? lung due to exposure   . COPD Brother   . Early death Brother   . COPD Son   . Depression Son   . Breast cancer Maternal Aunt   . Breast cancer Paternal Aunt   . Sleep apnea Daughter   . Allergic rhinitis Neg Hx   . Angioedema Neg Hx   . Eczema Neg Hx   . Urticaria Neg Hx   . Asthma Neg  Hx    Social History   Socioeconomic History  . Marital status: Widowed    Spouse name: Not on file  . Number of children: Not on file  . Years of education: Not on file  . Highest education level: Not on file  Occupational History  . Not on file  Tobacco Use  . Smoking status: Never Smoker  . Smokeless tobacco: Never Used  Substance and Sexual Activity  . Alcohol use: Yes    Comment: occ  . Drug use: No  . Sexual activity: Yes    Birth control/protection: None  Other Topics Concern  . Not on file  Social History Narrative   Bus driver    And school custodian    1 daughter lives in [place] 18 y.o as of 01/17/20   1 son 36 almost 9 in [place] Strain:   . Difficulty of Paying Living Expenses:   Food Insecurity:   . Worried About Charity fundraiser in the Last Year:   . Arboriculturist in the Last Year:   Transportation Needs:   .  Lack of Transportation (Medical):   Marland Kitchen Lack of Transportation (Non-Medical):   Physical Activity:   . Days of Exercise per Week:   . Minutes of Exercise per Session:   Stress:   . Feeling of Stress :   Social Connections:   . Frequency of Communication with Friends and Family:   . Frequency of Social Gatherings with Friends and Family:   . Attends Religious Services:   . Active Member of Clubs or Organizations:   . Attends Archivist Meetings:   Marland Kitchen Marital Status:   Intimate Partner Violence:   . Fear of Current or Ex-Partner:   . Emotionally Abused:   Marland Kitchen Physically Abused:   . Sexually Abused:    Current Meds  Medication Sig  . albuterol (PROVENTIL HFA;VENTOLIN HFA) 108 (90 Base) MCG/ACT inhaler Inhale 2 puffs into the lungs every 6 (six) hours as needed for wheezing or shortness of breath.  Marland Kitchen amLODipine (NORVASC) 5 MG tablet Take 1 tablet (5 mg total) by mouth daily.  Marland Kitchen atorvastatin (LIPITOR) 10 MG tablet Take 1 tablet (10 mg total) by mouth daily at 6 PM.  .  Cetirizine HCl (ZYRTEC PO) Take 25 mg by mouth.  . Cholecalciferol (VITAMIN D-3) 125 MCG (5000 UT) TABS Take by mouth daily.  . diphenhydrAMINE (BENADRYL) 25 mg capsule Take 25 mg by mouth every 6 (six) hours as needed (for allergies.).  Marland Kitchen EPINEPHrine (AUVI-Q) 0.3 mg/0.3 mL IJ SOAJ injection Use as directed for severe allergic reaction  . fluticasone (FLONASE) 50 MCG/ACT nasal spray Place 2 sprays into both nostrils daily.  Marland Kitchen gabapentin (NEURONTIN) 300 MG capsule Take 1-2 capsules (300-600 mg total) by mouth 3 (three) times daily.  . hydrochlorothiazide (HYDRODIURIL) 25 MG tablet Take 0.5 tablets (12.5 mg total) by mouth daily. In am  . losartan (COZAAR) 100 MG tablet Take 1 tablet (100 mg total) by mouth daily.  . meloxicam (MOBIC) 15 MG tablet Take 1 tablet (15 mg total) by mouth daily.  . methocarbamol (ROBAXIN) 750 MG tablet Take 1 tablet (750 mg total) by mouth every 8 (eight) hours as needed for muscle spasms.  . montelukast (SINGULAIR) 10 MG tablet Take 1 tablet (10 mg total) by mouth at bedtime.  . norethindrone (AYGESTIN) 5 MG tablet 2 (two) times daily.   Derrill Memo ON 02/10/2020] oxyCODONE (OXY IR/ROXICODONE) 5 MG immediate release tablet Take 1 tablet (5 mg total) by mouth daily as needed for severe pain. Must last 30 days  . pantoprazole (PROTONIX) 40 MG tablet Take 1 tablet (40 mg total) by mouth daily. 30 min. Before dinner  . [DISCONTINUED] fluticasone (FLONASE) 50 MCG/ACT nasal spray Place 2 sprays into both nostrils daily.   Allergies  Allergen Reactions  . Other Hives    White and red sauces  . Shellfish Allergy Hives  . Strawberry (Diagnostic) Hives  . Tomato Hives    cherry   Recent Results (from the past 2160 hour(s))  HM COLONOSCOPY     Status: None   Collection Time: 11/14/19 12:00 AM  Result Value Ref Range   HM Colonoscopy See Report (in chart) See Report (in chart), Patient Reported    Comment: mild gerd, hyperplastic polyp f/u in 5 yar Dr. Alice Reichert   HM  COLONOSCOPY     Status: None   Collection Time: 11/17/19 12:00 AM  Result Value Ref Range   HM Colonoscopy See Report (in chart) See Report (in chart), Patient Reported    Comment: Repeat 5 years  Objective  Body mass index is 43.42 kg/m. Wt Readings from Last 3 Encounters:  01/17/20 245 lb 1.9 oz (111.2 kg)  12/06/19 253 lb (114.8 kg)  10/25/19 250 lb (113.4 kg)   Temp Readings from Last 3 Encounters:  01/17/20 97.7 F (36.5 C) (Temporal)  12/23/19 (!) 97.5 F (36.4 C)  12/06/19 (!) 97.5 F (36.4 C) (Temporal)   BP Readings from Last 3 Encounters:  01/17/20 122/76  12/06/19 140/84  10/25/19 116/63   Pulse Readings from Last 3 Encounters:  01/17/20 77  12/06/19 89  10/25/19 69    Physical Exam Vitals and nursing note reviewed.  Constitutional:      Appearance: Normal appearance. She is well-developed and well-groomed. She is obese.  HENT:     Head: Normocephalic and atraumatic.  Eyes:     Conjunctiva/sclera: Conjunctivae normal.     Pupils: Pupils are equal, round, and reactive to light.  Cardiovascular:     Rate and Rhythm: Normal rate and regular rhythm.     Heart sounds: Normal heart sounds. No murmur.  Pulmonary:     Effort: Pulmonary effort is normal.     Breath sounds: Normal breath sounds.  Skin:    General: Skin is warm and dry.  Neurological:     General: No focal deficit present.     Mental Status: She is alert and oriented to person, place, and time. Mental status is at baseline.     Gait: Gait normal.  Psychiatric:        Attention and Perception: Attention and perception normal.        Mood and Affect: Mood and affect normal.        Speech: Speech normal.        Behavior: Behavior normal. Behavior is cooperative.        Thought Content: Thought content normal.        Cognition and Memory: Cognition and memory normal.        Judgment: Judgment normal.     Assessment  Plan  Excessive daytime sleepiness OSA (obstructive sleep apnea) -  Plan: Ambulatory referral to Pulmonology Snoring - Plan: Ambulatory referral to Pulmonology  Multiple environmental allergies - Plan: fluticasone (FLONASE) 50 MCG/ACT nasal spray  Essential hypertension - Plan: Comprehensive metabolic panel, Lipid panel Cont meds controlled   Vitamin D deficiency - Plan: Vitamin D (25 hydroxy)  Prediabetes - Plan: Hemoglobin A1c  Hyperlipidemia, unspecified hyperlipidemia type - Plan: Lipid panel  Cont meds lipitor 10 Repeat lipid upcoming  HM Flu shot utd  Tdap utd  covid vaccine considering   mammo 09/30/19 normal   2021 colonoscopy - GERD hyperplastic polyp f/u in 5 years   02/26/18 neg neg hpv h/o abnl h/o fibroids Dr.Song  Provider: Dr. Olivia Mackie McLean-Scocuzza-Internal Medicine

## 2020-01-17 NOTE — Patient Instructions (Addendum)
COVID-19 Vaccine Information can be found at: [email or web address] For questions related to vaccine distribution or appointments, please email [email or web address] or call [phone].   Consider Pfizer COVID 19 vaccine  [facility]   Sleep Apnea Sleep apnea is a condition in which breathing pauses or becomes shallow during sleep. Episodes of sleep apnea usually last 10 seconds or longer, and they may occur as many as 20 times an hour. Sleep apnea disrupts your sleep and keeps your body from getting the rest that it needs. This condition can increase your risk of certain health problems, including:  Heart attack.  Stroke.  Obesity.  Diabetes.  Heart failure.  Irregular heartbeat. What are the causes? There are three kinds of sleep apnea:  Obstructive sleep apnea. This kind is caused by a blocked or collapsed airway.  Central sleep apnea. This kind happens when the part of the brain that controls breathing does not send the correct signals to the muscles that control breathing.  Mixed sleep apnea. This is a combination of obstructive and central sleep apnea. The most common cause of this condition is a collapsed or blocked airway. An airway can collapse or become blocked if:  Your throat muscles are abnormally relaxed.  Your tongue and tonsils are larger than normal.  You are overweight.  Your airway is smaller than normal. What increases the risk? You are more likely to develop this condition if you:  Are overweight.  Smoke.  Have a smaller than normal airway.  Are elderly.  Are female.  Drink alcohol.  Take sedatives or tranquilizers.  Have a family history of sleep apnea. What are the signs or symptoms? Symptoms of this condition include:  Trouble staying asleep.  Daytime sleepiness and tiredness.  Irritability.  Loud snoring.  Morning headaches.  Trouble  concentrating.  Forgetfulness.  Decreased interest in sex.  Unexplained sleepiness.  Mood swings.  Personality changes.  Feelings of depression.  Waking up often during the night to urinate.  Dry mouth.  Sore throat. How is this diagnosed? This condition may be diagnosed with:  A medical history.  A physical exam.  A series of tests that are done while you are sleeping (sleep study). These tests are usually done in a sleep lab, but they may also be done at home. How is this treated? Treatment for this condition aims to restore normal breathing and to ease symptoms during sleep. It may involve managing health issues that can affect breathing, such as high blood pressure or obesity. Treatment may include:  Sleeping on your side.  Using a decongestant if you have nasal congestion.  Avoiding the use of depressants, including alcohol, sedatives, and narcotics.  Losing weight if you are overweight.  Making changes to your diet.  Quitting smoking.  Using a device to open your airway while you sleep, such as: ? An oral appliance. This is a custom-made mouthpiece that shifts your lower jaw forward. ? A continuous positive airway pressure (CPAP) device. This device blows air through a mask when you breathe out (exhale). ? A nasal expiratory positive airway pressure (EPAP) device. This device has valves that you put into each nostril. ? A bi-level positive airway pressure (BPAP) device. This device blows air through a mask when you breathe in (inhale) and breathe out (exhale).  Having surgery if other treatments do not work. During surgery, excess tissue is removed to create a wider airway. It is important to get treatment for sleep apnea. Without treatment, this condition can lead to:  High  blood pressure.  Coronary artery disease.  In men, an inability to achieve or maintain an erection (impotence).  Reduced thinking abilities. Follow these instructions at  home: Lifestyle  Make any lifestyle changes that your health care provider recommends.  Eat a healthy, well-balanced diet.  Take steps to lose weight if you are overweight.  Avoid using depressants, including alcohol, sedatives, and narcotics.  Do not use any products that contain nicotine or tobacco, such as cigarettes, e-cigarettes, and chewing tobacco. If you need help quitting, ask your health care provider. General instructions  Take over-the-counter and prescription medicines only as told by your health care provider.  If you were given a device to open your airway while you sleep, use it only as told by your health care provider.  If you are having surgery, make sure to tell your health care provider you have sleep apnea. You may need to bring your device with you.  Keep all follow-up visits as told by your health care provider. This is important. Contact a health care provider if:  The device that you received to open your airway during sleep is uncomfortable or does not seem to be working.  Your symptoms do not improve.  Your symptoms get worse. Get help right away if:  You develop: ? Chest pain. ? Shortness of breath. ? Discomfort in your back, arms, or stomach.  You have: ? Trouble speaking. ? Weakness on one side of your body. ? Drooping in your face. These symptoms may represent a serious problem that is an emergency. Do not wait to see if the symptoms will go away. Get medical help right away. Call your local emergency services (911 in the [place] Do not drive yourself to the hospital. Summary  Sleep apnea is a condition in which breathing pauses or becomes shallow during sleep.  The most common cause is a collapsed or blocked airway.  The goal of treatment is to restore normal breathing and to ease symptoms during sleep. This information is not intended to replace advice given to you by your health care provider. Make sure you discuss any questions you  have with your health care provider. Document Revised: 02/02/2019 Document Reviewed: 04/13/2018 Elsevier Patient Education  [place].  Sleep Studies A sleep study (polysomnogram) is a series of tests done while you are sleeping. A sleep study records your brain waves, heart rate, breathing rate, oxygen level, and eye and leg movements. A sleep study helps your health care provider:  See how well you sleep.  Diagnose a sleep disorder.  Determine how severe your sleep disorder is.  Create a plan to treat your sleep disorder. Your health care provider may recommend a sleep study if you:  Feel sleepy on most days.  Snore loudly while sleeping.  Have unusual behaviors while you sleep, such as walking.  Have brief periods in which you stop breathing during sleep (sleepapnea).  Fall asleep suddenly during the day (narcolepsy).  Have trouble falling asleep or staying asleep (insomnia).  Feel like you need to move your legs when trying to fall asleep (restless legs syndrome).  Move your legs by flexing and extending them regularly while asleep (periodic limb movement disorder).  Act out your dreams while you sleep (sleep behavior disorder).  Feel like you cannot move when you first wake up (sleep paralysis). What tests are part of a sleep study? Most sleep studies record the following during sleep:  Brain activity.  Eye movements.  Heart rate and rhythm.  Breathing rate and rhythm.  Blood-oxygen level.  Blood pressure.  Chest and belly movement as you breathe.  Arm and leg movements.  Snoring or other noises.  Body position. Where are sleep studies done? Sleep studies are done at sleep centers. A sleep center may be inside a hospital, office, or clinic. The room where you have the study may look like a hospital room or a hotel room. The health care providers doing the study may come in and out of the room during the study. Most of the time, they will be in  another room monitoring your test as you sleep. How are sleep studies done? Most sleep studies are done during a normal period of time for a full night of sleep. You will arrive at the study center in the evening and go home in the morning. Before the test  Bring your pajamas and toothbrush with you to the sleep study.  Do not have caffeine on the day of your sleep study.  Do not drink alcohol on the day of your sleep study.  Your health care provider will let you know if you should stop taking any of your regular medicines before the test. During the test      Round, sticky patches with sensors attached to recording wires (electrodes) are placed on your scalp, face, chest, and limbs.  Wires from all the electrodes and sensors run from your bed to a computer. The wires can be taken off and put back on if you need to get out of bed to go to the bathroom.  A sensor is placed over your nose to measure airflow.  A finger clip is put on your finger or ear to measure your blood oxygen level (pulse oximetry).  A belt is placed around your belly and a belt is placed around your chest to measure breathing movements.  If you have signs of the sleep disorder called sleep apnea during your test, you may get a treatment mask to wear for the second half of the night. ? The mask provides positive airway pressure (PAP) to help you breathe better during sleep. This may greatly improve your sleep apnea. ? You will then have all tests done again with the mask in place to see if your measurements and recordings change. After the test  A medical doctor who specializes in sleep will evaluate the results of your sleep study and share them with you and your primary health care provider.  Based on your results, your medical history, and a physical exam, you may be diagnosed with a sleep disorder, such as: ? Sleep apnea. ? Restless legs syndrome. ? Sleep-related behavior disorder. ? Sleep-related  movement disorders. ? Sleep-related seizure disorders.  Your health care team will help determine your treatment options based on your diagnosis. This may include: ? Improving your sleep habits (sleep hygiene). ? Wearing a continuous positive airway pressure (CPAP) or bi-level positive airway pressure (BPAP) mask. ? Wearing an oral device at night to improve breathing and reduce snoring. ? Taking medicines. Follow these instructions at home:  Take over-the-counter and prescription medicines only as told by your health care provider.  If you are instructed to use a CPAP or BPAP mask, make sure you use it nightly as directed.  Make any lifestyle changes that your health care provider recommends.  If you were given a device to open your airway while you sleep, use it only as told by your health care provider.  Do not use  any tobacco products, such as cigarettes, chewing tobacco, and e-cigarettes. If you need help quitting, ask your health care provider.  Keep all follow-up visits as told by your health care provider. This is important. Summary  A sleep study (polysomnogram) is a series of tests done while you are sleeping. It shows how well you sleep.  Most sleep studies are done over one full night of sleep. You will arrive at the study center in the evening and go home in the morning.  If you have signs of the sleep disorder called sleep apnea during your test, you may get a treatment mask to wear for the second half of the night.  A medical doctor who specializes in sleep will evaluate the results of your sleep study and share them with your primary health care provider. This information is not intended to replace advice given to you by your health care provider. Make sure you discuss any questions you have with your health care provider. Document Revised: 02/02/2019 Document Reviewed: 09/15/2017 Elsevier Patient Education  [place].

## 2020-01-24 ENCOUNTER — Ambulatory Visit: Payer: BC Managed Care – PPO | Admitting: Podiatry

## 2020-01-24 ENCOUNTER — Ambulatory Visit: Payer: Self-pay | Attending: Internal Medicine

## 2020-01-24 DIAGNOSIS — Z23 Encounter for immunization: Secondary | ICD-10-CM

## 2020-01-24 NOTE — Progress Notes (Signed)
   Covid-[facility] Vaccination [facility]  Name:  Anita Gibbs    MRN: [id number] DOB: [birth year]  01/24/2020  [name] was observed post Covid-19 immunization for 15 minutes without incident. She was provided with Vaccine Information Sheet and instruction to access the V-Safe system.   [name] was instructed to call 911 with any severe reactions post vaccine: Marland Kitchen Difficulty breathing  . Swelling of face and throat  . A fast heartbeat  . A bad rash all over body  . Dizziness and weakness   Immunizations Administered    Name Date Dose VIS Date Route   Pfizer COVID-19 Vaccine 01/24/2020  5:25 PM 0.3 mL 10/26/2018 Intramuscular   Manufacturer: [place]   Lot: [id number]   [place]: [id number]

## 2020-02-02 ENCOUNTER — Telehealth: Payer: Self-pay | Admitting: Internal Medicine

## 2020-02-02 ENCOUNTER — Encounter: Payer: Self-pay | Admitting: Pain Medicine

## 2020-02-02 ENCOUNTER — Ambulatory Visit: Payer: BC Managed Care – PPO | Attending: Pain Medicine | Admitting: Pain Medicine

## 2020-02-02 ENCOUNTER — Other Ambulatory Visit: Payer: Self-pay

## 2020-02-02 VITALS — BP 111/52 | HR 86 | Temp 99.9°F | Resp 18 | Ht 63.0 in | Wt 245.0 lb

## 2020-02-02 DIAGNOSIS — G894 Chronic pain syndrome: Secondary | ICD-10-CM | POA: Diagnosis not present

## 2020-02-02 DIAGNOSIS — Z79899 Other long term (current) drug therapy: Secondary | ICD-10-CM | POA: Insufficient documentation

## 2020-02-02 DIAGNOSIS — M5136 Other intervertebral disc degeneration, lumbar region: Secondary | ICD-10-CM | POA: Diagnosis not present

## 2020-02-02 DIAGNOSIS — M9904 Segmental and somatic dysfunction of sacral region: Secondary | ICD-10-CM | POA: Diagnosis present

## 2020-02-02 DIAGNOSIS — G8929 Other chronic pain: Secondary | ICD-10-CM | POA: Diagnosis present

## 2020-02-02 DIAGNOSIS — M545 Low back pain, unspecified: Secondary | ICD-10-CM

## 2020-02-02 DIAGNOSIS — M461 Sacroiliitis, not elsewhere classified: Secondary | ICD-10-CM | POA: Diagnosis present

## 2020-02-02 DIAGNOSIS — M47816 Spondylosis without myelopathy or radiculopathy, lumbar region: Secondary | ICD-10-CM | POA: Insufficient documentation

## 2020-02-02 NOTE — Progress Notes (Signed)
Safety precautions to be maintained throughout the outpatient stay will include: orient to surroundings, keep bed in low position, maintain call bell within reach at all times, provide assistance with transfer out of bed and ambulation.  

## 2020-02-02 NOTE — Telephone Encounter (Signed)
-----   Message from Delorise Jackson, MD sent at 01/17/2020 12:56 PM EDT ----- Can you move labs back to 6/9 or after has to be 91 days for insurance fasting labs thanks

## 2020-02-02 NOTE — Telephone Encounter (Signed)
Patient rescheduled for 02/08/20 at 9:00 am. Patient informed and verbalized understanding.

## 2020-02-02 NOTE — Progress Notes (Signed)
PROVIDER NOTE: Information contained herein reflects review and annotations entered in association with encounter. Interpretation of such information and data should be left to medically-trained personnel. Information provided to patient can be located elsewhere in the medical record under "Patient Instructions". Document created using STT-dictation technology, any transcriptional errors that may result from process are unintentional.    Patient: Anita Gibbs  Service Category: E/M  Provider: Gaspar Cola, MD  DOB: 05-13-1971  DOS: 02/02/2020  Referring Provider: Orland Mustard *  MRN: [id number]  Setting: Ambulatory outpatient  PCP: McLean-Scocuzza, Nino Glow, MD  Type: Established Patient  Specialty: Interventional Pain Management    Location: Office  Delivery: Face-to-face     HPI  Reason for encounter: [name], a 49 y.o. year old female, is here today for evaluation and management of her Chronic pain syndrome [G89.4]. Ms. Harpenau primary complain today is Back Pain (low) Last encounter: Practice (11/21/2019). [name] was on 10/25/2019. Pertinent problems: Ms. Friberg has Cervical myelopathy ([place]); Chronic gout of foot (Left); Numbness and tingling of both feet; Chronic low back pain (Tertiary Area of Pain) (Bilateral) (R>L) w/o sciatica; Chronic neck pain (Primary Area of Pain) (Bilateral) (L>R); Chronic low back pain [facility] Area of Pain) (Bilateral) (R>L) w/ sciatica (Bilateral); Chronic lower extremity pain (Fourth Area of Pain) (Bilateral) (R>L); Chronic pain syndrome; Chronic sacroiliac joint pain (Right); Chronic upper extremity pain (Secondary Area of Pain) (Bilateral) (R>L); Cervicalgia (Primary Area of Pain) (Bilateral) (L>R); History of fusion of cervical spine (ACDF C4-C7); Abnormal MRI, cervical spine (06/10/2018); 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-5, C5-6, and C6-7); Cervical facet joint syndrome (Bilateral) (L>R);  Cervical facet hypertrophy (C3-T1); 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); Chronic sacroiliac joint dysfunction (Bilateral); Osteoarthritis of sacroiliac joint (Bilateral); Somatic dysfunction of sacroiliac joint (Bilateral); DDD (degenerative disc disease), cervical; Chronic neck pain (Bilateral) w/ history of cervical spinal surgery; Spondylosis, cervical, w/ myelopathy; Cervical spondylosis; DDD (degenerative disc disease), lumbar; Neurogenic pain; Chronic musculoskeletal pain; Spondylosis without myelopathy or radiculopathy, cervical region; Lumbar facet syndrome (Bilateral) (R>L); Spondylosis without myelopathy or radiculopathy, lumbosacral region; and Other intervertebral disc degeneration, lumbar region on their pertinent problem list. Pain Assessment: Severity of Chronic pain is reported as a 5 /10. Location: Back Lower/radiates down the back of right leg to heels. Onset: More than a month ago. Quality: Sharp, Stabbing. Timing: Constant. Modifying factor(s): medications and ice. Vitals:  height is '5\' 3"'  (1.6 m) and weight is 245 lb (111.1 kg). Her temperature is 99.9 F (37.7 C). Her blood pressure is 111/52 (abnormal) and her pulse is 86. Her respiration is 18 and oxygen saturation is 100%.   The patient comes into the clinics today to see what is it that we can do about her low back pain.  At the beginning I was surprised since this is something that we had already discussed.  Since she got such good relief with the diagnostic lumbar facet blocks the plan was to simply go ahead and do a radiofrequency ablation for the purpose of improving her pain so that she could increase her level of activity and continue gainful employment.  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  On the last visit I had indicated that she needed to have a radiofrequency ablation and I went ahead  and put the orders for that.  As it  turns out, her insurance company apparently denied it because she does not have an MRI.  So in order to be able to do the procedure we had to start playing board games and so we order an MRI, which in turn got denied.  So were having an interesting situation where they are denying treatment because they need her to have an MRI however, they do want to pay for it.  So at this point I am trying to figure out what is happening with these insurance companies that they have slightly cross the line in terms of stupidity.  So today we have looked at the recent why they have not approved the MRI and they claim that it is because the patient has not had a trial of physical therapy.  However in talking to the patient today, we have confirmed that her primary area of pain is that of the lower back, midline, and going all across it with bilateral lower extremity pain that seems to be worse on the right side where she has pain that goes all the way through the back of the leg into the area of the back of the ankle.  In addition to a history compatible with lumbar facet syndrome, we also have a physical exam that is positive for bilateral lumbar facet pain arthropathy.  Today we have confirmed that by submitting the patient to several provocative test where it show that the patient has a positive pain response to hyperextension on rotation of the lumbar spine with exact reproduction of the patient's low back pain.  Furthermore a Therapist, art and other test designed to evaluate for older similar causes of the pain seemed to be positive/equivocal on the right side for possible right sacroiliac joint pain.  However if to all of this we also add the results of the 3 diagnostic lumbar facet blocks, we have very strong evidence that this is coming from the facet joints and therefore it needs to be treated.   Today I went over the treatment plan for her low back pain.  She has already had  three diagnostic bilateral lumbar facet blocks where she attained 100% relief of the pain for the duration of the local anesthetic, however she has had marginal long-term benefit from these. The first bilateral lumbar facet block was done 05/19/2019 and it provided the patient with 100% relief of the pain for the duration of the local anesthetic and upon being evaluated on 06/13/2019 the patient had indicated that she was still having 100% relief of her low back pain. The second diagnostic bilateral lumbar facet block was done on 07/21/2019 and once more provided the patient with 100% relief of the pain for the duration of the local anesthetic followed by an older 100% relief that continued past the follow-up evaluation on 08/10/2019. The patient's third and last diagnostic bilateral lumbar facet block was done on 10/25/2019 and again I provided the patient with 100% relief of the pain for the duration of the local anesthetic followed by a decrease to a 30% benefit by 11/14/2019.  Today I will go ahead and send a referral to physical therapy for evaluation and treatment of the patient's low back pain.  I suspect that physical therapy will be short since the typical thing that we see with patient is having facet joint pain is that the physical therapy tends to worsen these.  If this is the case, she will not be able to tolerate it.  In addition to this,  I will go ahead and put an order for the lumbar MRI to evaluate the patient's low back pain and right lower extremity pain.  She previously had x-rays of the lumbar spine which were not diagnostic since they did not show any bone pathology however, we do need to keep in mind that the reason why we need an MRI is to evaluate for soft tissue pathology.  Medical Necessity: Ms. Rightmyer has experienced debilitating chronic pain from the Lumbosacral Facet Syndrome (Spondylosis without myelopathy or radiculopathy, lumbosacral region [M47.817]) that has persisted for longer  than three months of failed non-surgical care and has either failed to respond, or was unable to tolerate, or simply did not get enough benefit from other more conservative therapies including, but not limited to: 1. Over-the-counter oral analgesic medications (i.e.: ibuprofen, naproxen, etc.) 2. Anti-inflammatory medications 3. Muscle relaxants 4. Membrane stabilizers 5. Opioids 6. Home exercises 7. Modalities (Heat, ice, etc.) 8. Invasive techniques such as nerve blocks.  Ms. Raus has attained greater than 50% reduction in pain from at least three (3) diagnostic medial branch blocks conducted in separate occasions. For this reason, I believe it is medically necessary to proceed with Non-Pulsed Radiofrequency Ablation for the purpose of attempting to prolong the duration of the benefits seen with the diagnostic injections.  Diagnosis of facet-mediated spinal pain is difficult. History and physical examination may suggest, but cannot confirm, the facet joint as the source of pain (Hancock et al. 2007). Although Radiologists are commonly asked by Clinicians to determine the degree of facet joint osteoarthritis, the published radiological investigations report no correlation between the clinical symptoms of low back pain and degenerative spinal changes observed on radiologic imaging studies  ([place]).  Pharmacotherapy Assessment  Analgesic: Oxycodone IR 5 mg, 1 tab PO QOD (2.5 mg/day of oxycodone) (last filled on 01/23/2020) MME/day:7.70m/day.   Monitoring: [place] PMP: PDMP reviewed during this encounter.       Pharmacotherapy: No side-effects or adverse reactions reported. Compliance: No problems identified. Effectiveness: Clinically acceptable.  UDS:  Summary  Date Value Ref Range Status  09/07/2018 FINAL  Final    Comment:    ==================================================================== [facility] COMP DRUG  ANALYSIS,UR ==================================================================== Test                             Result       Flag       Units Drug Present and Declared for Prescription Verification   Oxycodone                      220          EXPECTED   ng/mg creat   Oxymorphone                    65           EXPECTED   ng/mg creat   Noroxycodone                   363          EXPECTED   ng/mg creat    Sources of oxycodone include scheduled prescription medications.    Oxymorphone and noroxycodone are expected metabolites of    oxycodone. Oxymorphone is also available as a scheduled    prescription medication.   Gabapentin  PRESENT      EXPECTED   Methocarbamol                  PRESENT      EXPECTED   Diphenhydramine                PRESENT      EXPECTED Drug Absent but Declared for Prescription Verification   Acetaminophen                  Not Detected UNEXPECTED    Acetaminophen, as indicated in the declared medication list, is    not always detected even when used as directed.   Salicylate                     Not Detected UNEXPECTED    Aspirin, as indicated in the declared medication list, is not    always detected even when used as directed.   Naproxen                       Not Detected UNEXPECTED ==================================================================== Test                      Result    Flag   Units      Ref Range   Creatinine              207              mg/dL      >=20 ==================================================================== Declared Medications:  The flagging and interpretation on this report are based on the  following declared medications.  Unexpected results may arise from  inaccuracies in the declared medications.  **Note: The testing scope of this panel includes these medications:  Diphenhydramine (Benadryl)  Gabapentin  Methocarbamol (Robaxin)  Naproxen (Naprosyn)  Oxycodone  **Note: The testing scope of this panel does  not include small to  moderate amounts of these reported medications:  Acetaminophen (Excedrin)  Aspirin (Excedrin)  **Note: The testing scope of this panel does not include following  reported medications:  Albuterol  Caffeine (Excedrin)  Epinephrine (EpiPen)  Famotidine (Pepcid)  Losartan (Cozaar)  Metronidazole (Flagyl) ==================================================================== For clinical consultation, please call [phone]. ====================================================================    ROS  Constitutional: Denies any fever or chills Gastrointestinal: No reported hemesis, hematochezia, vomiting, or acute GI distress Musculoskeletal: Denies any acute onset joint swelling, redness, loss of ROM, or weakness Neurological: No reported episodes of acute onset apraxia, aphasia, dysarthria, agnosia, amnesia, paralysis, loss of coordination, or loss of consciousness  Medication Review  Cetirizine HCl, EPINEPHrine, Vitamin D-3, albuterol, amLODipine, atorvastatin, diphenhydrAMINE, fluticasone, gabapentin, hydrochlorothiazide, losartan, meloxicam, methocarbamol, montelukast, norethindrone, oxyCODONE, and pantoprazole  History Review  Allergy: Ms. Mitchum is allergic to other; shellfish allergy; strawberry (diagnostic); and tomato. Drug: Ms. Marszalek  reports no history of drug use. Alcohol:  reports current alcohol use. Tobacco:  reports that she has never smoked. She has never used smokeless tobacco. Social: Ms. Judice  reports that she has never smoked. She has never used smokeless tobacco. She reports current alcohol use. She reports that she does not use drugs. Medical:  has a past medical history of Arthritis, Carpal boss, right, Carpal tunnel syndrome, bilateral, Headache, Hypertension, and Seasonal asthma (11/29/2018). Surgical: Ms. Ronning  has a past surgical history that includes Knee arthroscopy (Right) and Anterior cervical decomp/discectomy fusion (N/A,  07/01/2017). Family: family history includes [name] in her mother; Arthritis in her father, mother, and sister;  Breast cancer in her maternal aunt and paternal aunt; COPD in her brother and son; Cancer in her brother; Depression in her son; Diabetes in her mother; Early death in her brother; Hearing loss in her father; Hyperlipidemia in her mother; Hypertension in her father, mother, and sister; Kidney disease in her father; Lymphoma in her father; Sleep apnea in her daughter.  Laboratory Chemistry Profile   Renal Lab Results  Component Value Date   BUN 14 10/10/2019   CREATININE 1.05 10/10/2019   BCR 13 09/07/2018   GFR 68.20 10/10/2019   GFRAA 76 09/07/2018   GFRNONAA 66 09/07/2018     Hepatic Lab Results  Component Value Date   AST 16 10/10/2019   ALT 19 10/10/2019   ALBUMIN 4.2 10/10/2019   ALKPHOS 41 10/10/2019     Electrolytes Lab Results  Component Value Date   NA 138 10/10/2019   K 3.9 10/10/2019   CL 104 10/10/2019   CALCIUM 9.4 10/10/2019   MG 2.2 09/07/2018     Bone Lab Results  Component Value Date   VD25OH 11.15 (L) 10/10/2019   25OHVITD1 8.1 (L) 09/07/2018   25OHVITD2 <1.0 09/07/2018   25OHVITD3 8.1 09/07/2018     Inflammation (CRP: Acute Phase) (ESR: Chronic Phase) Lab Results  Component Value Date   CRP 7 09/07/2018   ESRSEDRATE 67 (H) 09/07/2018       Note: Above Lab results reviewed.  Recent Imaging Review  DG Foot Complete Left CLINICAL DATA:  Bilat pain  EXAM: LEFT FOOT - COMPLETE 3+ VIEW  COMPARISON:  05/31/2018  FINDINGS: Osseous mineralization normal.  Joint spaces preserved.  Small plantar and Achilles insertion calcaneal spurs.  Dorsal soft tissue swelling over the distal metatarsals.  No acute fracture, dislocation, or bone destruction.  IMPRESSION: Calcaneal spurring.  No acute osseous abnormalities.  Electronically Signed   By: Lavonia Dana M.D.   On: 12/07/2019 08:21 DG Foot Complete Right CLINICAL DATA:   BILATERAL foot pain  EXAM: RIGHT FOOT COMPLETE - 3+ VIEW  COMPARISON:  None  FINDINGS: Small plantar and Achilles insertion calcaneal spurs.  Osseous mineralization normal.  No fracture, dislocation, or bone destruction.  IMPRESSION: Calcaneal spurring.  No acute osseous abnormalities.  Electronically Signed   By: Lavonia Dana M.D.   On: 12/07/2019 08:20      Note: Reviewed Radiography is not sensitive for detection of mild osteoarthritis of the facet joints. It can detect severe cases of facet arthrosis. Oblique views are preferred over standard AP and lateral views. Joint space narrowing, subchondral sclerosis, and osteophyte formation are common radiographic findings.  In this case, we can see an example of the joint space narrowing of the subchondral sclerosis of the L3-4, L4-5, and L5-S1 facet joints, bilaterally, with the L5-S1 showing the most degree of degeneration  Physical Exam  General appearance: Well nourished, well developed, and well hydrated. In no apparent acute distress Mental status: Alert, oriented x 3 (person, place, & time)       Respiratory: No evidence of acute respiratory distress Eyes: PERLA Vitals: BP (!) 111/52   Pulse 86   Temp 99.9 F (37.7 C)   Resp 18   Ht '5\' 3"'  (1.6 m)   Wt 245 lb (111.1 kg)   SpO2 100%   BMI 43.40 kg/m  BMI: Estimated body mass index is 43.4 kg/m as calculated from the following:   Height as of this encounter: '5\' 3"'  (1.6 m).   Weight as of this encounter: 245 lb (  111.1 kg). Ideal: Ideal body weight: 52.4 kg (115 lb 8.3 oz) Adjusted ideal body weight: 75.9 kg (167 lb 5 oz)  Lumbar Exam  Skin & Axial Inspection: No masses, redness, or swelling Alignment: Symmetrical Functional ROM: Decreased ROM affecting both sides Stability: No instability detected Muscle Tone/Strength: Increased muscle tone over affected area Sensory (Neurological): Movement-associated pain Palpation: Complains of area being tender to palpation        Provocative Tests: Hyperextension/rotation test: (+) bilaterally for facet joint pain. Lumbar quadrant test (Kemp's test): (+) bilaterally for facet joint pain. Lateral bending test: (-)       Patrick's Maneuver: (+) for right-sided S-I arthralgia             FABER* test: (+) for right-sided S-I arthralgia             S-I anterior distraction/compression test: Non-diagnostic         S-I lateral compression test: Non-diagnostic         S-I Thigh-thrust test: Non-diagnostic         S-I Gaenslen's test: Non-diagnostic         *(Flexion, ABduction and External Rotation)  Lower Extremity Exam    Side: Right lower extremity  Side: Left lower extremity  Stability: No instability observed          Stability: No instability observed          Skin & Extremity Inspection: Skin color, temperature, and hair growth are WNL. No peripheral edema or cyanosis. No masses, redness, swelling, asymmetry, or associated skin lesions. No contractures.  Skin & Extremity Inspection: Skin color, temperature, and hair growth are WNL. No peripheral edema or cyanosis. No masses, redness, swelling, asymmetry, or associated skin lesions. No contractures.  Functional ROM: Decreased ROM for knee joint Limited SLR (straight leg raise)  Functional ROM: Unrestricted ROM         Adequate SLR (straight leg raise)  Muscle Tone/Strength: Able to Toe-walk & Heel-walk without problems  Muscle Tone/Strength: Able to Toe-walk & Heel-walk without problems  Sensory (Neurological): Dermatomal pain pattern sacral  Sensory (Neurological): Unimpaired        DTR: Patellar: deferred today Achilles: deferred today Plantar: deferred today  DTR: Patellar: deferred today Achilles: deferred today Plantar: deferred today  Palpation: No palpable anomalies  Palpation: No palpable anomalies   Assessment   Status Diagnosis  Persistent Unimproved Unimproved 1. Chronic pain syndrome   2. Chronic low back pain [facility] Area of Pain)  (Bilateral) (R>L) w/o sciatica   3. Lumbar facet syndrome (Bilateral) (R>L)   4. DDD (degenerative disc disease), lumbar   5. Other intervertebral disc degeneration, lumbar region   6. Osteoarthritis of sacroiliac joint (Bilateral)   7. Somatic dysfunction of sacroiliac joint (Bilateral)   8. Pharmacologic therapy      Updated Problems: No problems updated.  Plan of Care  Problem-specific:  No problem-specific Assessment & Plan notes found for this encounter.  Ms. FORRESTINE LECRONE has a current medication list which includes the following long-term medication(s): albuterol, amlodipine, atorvastatin, cetirizine hcl, diphenhydramine, fluticasone, gabapentin, hydrochlorothiazide, losartan, methocarbamol, montelukast, norethindrone, [START ON 02/10/2020] oxycodone, and pantoprazole.  Pharmacotherapy (Medications Ordered): No orders of the defined types were placed in this encounter.  Orders:  Orders Placed This Encounter  Procedures  . Radiofrequency,Lumbar    Standing Status:   Future    Standing Expiration Date:   02/01/2021    Scheduling Instructions:     Side(s): Left-sided     Level: L3-4,  L4-5, & L5-S1 Facets (L2, L3, L4, L5, & S1 Medial Branch Nerves)     Sedation: With Sedation.     Scheduling Timeframe: As soon as pre-approved    Order Specific Question:   Where will this procedure be performed?    Answer:   [facility]  . ToxASSURE Select 13 (MW), Urine    Volume: 30 ml(s). Minimum 3 ml of urine is needed. Document temperature of fresh sample. Indications: Long term (current) use of opiate analgesic (D14.970)    Order Specific Question:   Release to patient    Answer:   Immediate  . Ambulatory referral to Physical Therapy    Referral Priority:   Routine    Referral Type:   Physical Medicine    Referral Reason:   Specialty Services Required    Requested Specialty:   Physical Therapy    Number of Visits Requested:   1  . Ambulatory referral to Physical Therapy     Referral Priority:   Routine    Referral Type:   Physical Medicine    Referral Reason:   Specialty Services Required    Requested Specialty:   Physical Therapy    Number of Visits Requested:   1   Follow-up plan:   Return for RFA (w/ sedation): (L) L-FCT RFA #1.      Interventional management options:  Considering:   NOTE: Shellfish allergy Possible bilateral cervical facetRFA Diagnostic right L3-4 LESI Diagnostic bilateral L3 vs L4 TFESI  Possible bilateral lumbar facetRFA #1(starting with the left side) Diagnostic right vs bilateral SI joint injection Possible right vs bilateral SI jointRFA   Palliative PRN treatment(s):   Diagnostic/therapeutic bilateral lumbar facet block #4 (100/100/100) (100/100/100) (100/100/30) Palliative right CESI #4  Diagnostic bilateral cervicalfacet block #2    Recent Visits Date Type Provider Dept  11/14/19 Telemedicine Milinda Pointer, MD [facility]  Showing recent visits within past 90 days and meeting all other requirements   Today's Visits Date Type Provider Dept  02/02/20 Office Visit Milinda Pointer, MD [facility]  Showing today's visits and meeting all other requirements   Future Appointments Date Type Provider Dept  03/07/20 Appointment Milinda Pointer, MD [facility]  Showing future appointments within next 90 days and meeting all other requirements   I discussed the assessment and treatment plan with the patient. The patient was provided an opportunity to ask questions and all were answered. The patient agreed with the plan and demonstrated an understanding of the instructions.  Patient advised to call back or seek an in-person evaluation if the symptoms or condition worsens.  Duration of encounter: 35 minutes.  Note by: Gaspar Cola, MD Date: 02/02/2020; Time: 3:56 PM

## 2020-02-02 NOTE — Patient Instructions (Signed)
____________________________________________________________________________________________  Preparing for Procedure with Sedation  Procedure appointments are limited to planned procedures: . No Prescription Refills. . No disability issues will be discussed. . No medication changes will be discussed.  Instructions: . Oral Intake: Do not eat or drink anything for at least 8 hours prior to your procedure. (Exception: Blood Pressure Medication. See below.) . Transportation: Unless otherwise stated by your physician, you may drive yourself after the procedure. . Blood Pressure Medicine: Do not forget to take your blood pressure medicine with a sip of water the morning of the procedure. If your Diastolic (lower reading)is above 100 mmHg, elective cases will be cancelled/rescheduled. . Blood thinners: These will need to be stopped for procedures. Notify our staff if you are taking any blood thinners. Depending on which one you take, there will be specific instructions on how and when to stop it. . Diabetics on insulin: Notify the staff so that you can be scheduled 1st case in the morning. If your diabetes requires high dose insulin, take only  of your normal insulin dose the morning of the procedure and notify the staff that you have done so. . Preventing infections: Shower with an antibacterial soap the morning of your procedure. . Build-up your immune system: Take 1000 mg of Vitamin C with every meal (3 times a day) the day prior to your procedure. . Antibiotics: Inform the staff if you have a condition or reason that requires you to take antibiotics before dental procedures. . Pregnancy: If you are pregnant, call and cancel the procedure. . Sickness: If you have a cold, fever, or any active infections, call and cancel the procedure. . Arrival: You must be in the facility at least 30 minutes prior to your scheduled procedure. . Children: Do not bring children with you. . Dress appropriately:  Bring dark clothing that you would not mind if they get stained. . Valuables: Do not bring any jewelry or valuables.  Reasons to call and reschedule or cancel your procedure: (Following these recommendations will minimize the risk of a serious complication.) . Surgeries: Avoid having procedures within 2 weeks of any surgery. (Avoid for 2 weeks before or after any surgery). . Flu Shots: Avoid having procedures within 2 weeks of a flu shots or . (Avoid for 2 weeks before or after immunizations). . Barium: Avoid having a procedure within 7-10 days after having had a radiological study involving the use of radiological contrast. (Myelograms, Barium swallow or enema study). . Heart attacks: Avoid any elective procedures or surgeries for the initial 6 months after a "Myocardial Infarction" (Heart Attack). . Blood thinners: It is imperative that you stop these medications before procedures. Let us know if you if you take any blood thinner.  . Infection: Avoid procedures during or within two weeks of an infection (including chest colds or gastrointestinal problems). Symptoms associated with infections include: Localized redness, fever, chills, night sweats or profuse sweating, burning sensation when voiding, cough, congestion, stuffiness, runny nose, sore throat, diarrhea, nausea, vomiting, cold or Flu symptoms, recent or current infections. It is specially important if the infection is over the area that we intend to treat. . Heart and lung problems: Symptoms that may suggest an active cardiopulmonary problem include: cough, chest pain, breathing difficulties or shortness of breath, dizziness, ankle swelling, uncontrolled high or unusually low blood pressure, and/or palpitations. If you are experiencing any of these symptoms, cancel your procedure and contact your primary care physician for an evaluation.  Remember:  Regular Business hours are:    Monday to Thursday 8:00 AM to 4:00 PM  Provider's  Schedule: Milinda Pointer, MD:  Procedure days: Tuesday and Thursday 7:30 AM to 4:00 PM  Gillis Santa, MD:  Procedure days: Monday and Wednesday 7:30 AM to 4:00 PM ____________________________________________________________________________________________    ______________________________________________________________________________________________  Weight Management Required  URGENT: Your weight has been found to be adversely affecting your health.  Dear Anita Gibbs:  Your current Estimated body mass index is 43.42 kg/m as calculated from the following:   Height as of 01/17/20: _0  (1.6 m).   Weight as of 01/17/20: 245 lb 1.9 oz (111.2 kg).  Please use the table below to identify your weight category and associated incidence of chronic pain, secondary to your weight.  Body Mass Index (BMI) Classification BMI level (kg/m2) Category Associated incidence of chronic pain  <18  Underweight   18.5-24.9 Ideal body weight   25-29.9 Overweight  20%  30-34.9 Obese (Class I)  68%  35-39.9 Severe obesity (Class II)  136%  >40 Extreme obesity (Class III)  254%   In addition: You will be considered "Morbidly Obese", if your BMI is above 30 and you have one or more of the following conditions which are known to be caused and/or directly associated with obesity: 1.    Type 2 Diabetes (Which in turn can lead to cardiovascular diseases (CVD), stroke, peripheral vascular diseases (PVD), retinopathy, nephropathy, and neuropathy) 2.    Cardiovascular Disease (High Blood Pressure; Congestive Heart Failure; High Cholesterol; Coronary Artery Disease; Angina; or History of Heart Attacks) 3.    Breathing problems (Asthma; obesity-hypoventilation syndrome; obstructive sleep apnea; chronic inflammatory airway disease; reactive airway disease; or shortness of breath) 4.    Chronic kidney disease 5.    Liver disease (nonalcoholic fatty liver disease) 6.    High blood pressure 7.    Acid reflux  (gastroesophageal reflux disease; heartburn) 8.    Osteoarthritis (OA) (with any of the following: hip pain; knee pain; and/or low back pain) 9.    Low back pain (Lumbar Facet Syndrome; and/or Degenerative Disc Disease) 10.  Hip pain (Osteoarthritis of hip) (For every 1 lbs of added body weight, there is a 2 lbs increase in pressure inside of each hip articulation. 1:2 mechanical relationship) 11.  Knee pain (Osteoarthritis of knee) (For every 1 lbs of added body weight, there is a 4 lbs increase in pressure inside of each knee articulation. 1:4 mechanical relationship) (patients with a BMI>30 kg/m2 were 6.8 times more likely to develop knee OA than normal-weight individuals) 12.  Cancer: Epidemiological studies have shown that obesity is a risk factor for: post-menopausal breast cancer; cancers of the endometrium, colon and kidney cancer; malignant adenomas of the oesophagus. Obese subjects have an approximately 1.5-3.5-fold increased risk of developing these cancers compared with normal-weight subjects, and it has been estimated that between 15 and 45% of these cancers can be attributed to overweight. More recent studies suggest that obesity may also increase the risk of other types of cancer, including pancreatic, hepatic and gallbladder cancer. (Ref: Obesity and cancer. Pischon T, Nthlings U, [employer] H. Proc Nutr Soc. 2008 May;67(2):128-45. doi: 16.1096/E4540981191478295.) The International Agency for Research on Cancer (IARC) has identified 13 cancers associated with overweight and obesity: meningioma, multiple myeloma, adenocarcinoma of the esophagus, and cancers of the thyroid, postmenopausal breast cancer, gallbladder, stomach, liver, pancreas, kidney, ovaries, uterus, colon and rectal (colorectal) cancers. 69 percent of all cancers diagnosed in women and 24 percent of those diagnosed in men are associated with  overweight and obesity.  Recommendation: At this point it is urgent that you take a step  back and concentrate in loosing weight. Dedicate 100% of your efforts on this task. Nothing else will improve your health more than bringing your weight down and your BMI to less than 30. If you are here, you probably have chronic pain. Because most chronic pain patients have difficulty exercising secondary to their pain, you must rely on proper nutrition and diet in order to lose the weight. If your BMI is above 40, you should seriously consider bariatric surgery. A realistic goal is to lose 10% of your body weight over a period of 12 months.  Be honest to yourself, if over time you have unsuccessfully tried to lose weight, then it is time for you to seek professional help and to enter a medically supervised weight management program, and/or undergo bariatric surgery. Stop procrastinating.   Pain management considerations:  1.    Pharmacological Problems: Be advised that the use of opioid analgesics (oxycodone; hydrocodone; morphine; methadone; codeine; and all of their derivatives) have been associated with decreased metabolism and weight gain.  For this reason, should we see that you are unable to lose weight while taking these medications, it may become necessary for [place] to taper down and indefinitely discontinue them.  2.    Technical Problems: The incidence of successful interventional therapies decreases as the patient's BMI increases. It is much more difficult to accomplish a safe and effective interventional therapy on a patient with a BMI above 35. 3.    Radiation Exposure Problems: The x-rays machine, used to accomplish injection therapies, will automatically increase their x-ray output in order to capture an appropriate bone image. This means that radiation exposure increases exponentially with the patient's BMI. (The higher the BMI, the higher the radiation exposure.) Although the level of radiation used at a given time is still safe to the patient, it is not for the physician and/or assisting  staff. Unfortunately, radiation exposure is accumulative. Because physicians and the staff have to do procedures and be exposed on a daily basis, this can result in health problems such as cancer and radiation burns. Radiation exposure to the staff is monitored by the radiation batches that they wear. The exposure levels are reported back to the staff on a quarterly basis. Depending on levels of exposure, physicians and staff may be obligated by law to decrease this exposure. This means that they have the right and obligation to refuse providing therapies where they may be overexposed to radiation. For this reason, physicians may decline to offer therapies such as radiofrequency ablation or implants to patients with a BMI above 40. 4.    Current Trends: Be advised that the current trend is to no longer offer certain therapies to patients with a BMI equal to, or above 35, due to increase perioperative risks, increased technical procedural difficulties, and excessive radiation exposure to healthcare personnel.  ______________________________________________________________________________________________

## 2020-02-07 ENCOUNTER — Other Ambulatory Visit: Payer: BC Managed Care – PPO

## 2020-02-07 LAB — TOXASSURE SELECT 13 (MW), URINE

## 2020-02-08 ENCOUNTER — Other Ambulatory Visit: Payer: Self-pay

## 2020-02-08 ENCOUNTER — Other Ambulatory Visit: Payer: BC Managed Care – PPO

## 2020-02-08 NOTE — Addendum Note (Signed)
Addended by: Leeanne Rio on: 02/08/2020 09:18 AM   Modules accepted: Orders

## 2020-02-14 ENCOUNTER — Other Ambulatory Visit (INDEPENDENT_AMBULATORY_CARE_PROVIDER_SITE_OTHER): Payer: BC Managed Care – PPO

## 2020-02-14 ENCOUNTER — Other Ambulatory Visit: Payer: Self-pay

## 2020-02-14 ENCOUNTER — Ambulatory Visit: Payer: BC Managed Care – PPO | Attending: Internal Medicine

## 2020-02-14 DIAGNOSIS — E559 Vitamin D deficiency, unspecified: Secondary | ICD-10-CM

## 2020-02-14 DIAGNOSIS — E785 Hyperlipidemia, unspecified: Secondary | ICD-10-CM

## 2020-02-14 DIAGNOSIS — I10 Essential (primary) hypertension: Secondary | ICD-10-CM

## 2020-02-14 DIAGNOSIS — Z23 Encounter for immunization: Secondary | ICD-10-CM

## 2020-02-14 DIAGNOSIS — R7303 Prediabetes: Secondary | ICD-10-CM

## 2020-02-14 LAB — COMPREHENSIVE METABOLIC PANEL
ALT: 17 U/L (ref 0–35)
AST: 14 U/L (ref 0–37)
Albumin: 3.9 g/dL (ref 3.5–5.2)
Alkaline Phosphatase: 36 U/L — ABNORMAL LOW (ref 39–117)
BUN: 17 mg/dL (ref 6–23)
CO2: 27 mEq/L (ref 19–32)
Calcium: 9.1 mg/dL (ref 8.4–10.5)
Chloride: 104 mEq/L (ref 96–112)
Creatinine, Ser: 1.05 mg/dL (ref 0.40–1.20)
GFR: 68.09 mL/min (ref >60.00)
Glucose, Bld: 95 mg/dL (ref 70–99)
Potassium: 4.1 mEq/L (ref 3.5–5.1)
Sodium: 136 mEq/L (ref 135–145)
Total Bilirubin: 0.4 mg/dL (ref 0.2–1.2)
Total Protein: 6.9 g/dL (ref 6.0–8.3)

## 2020-02-14 LAB — LIPID PANEL
Cholesterol: 156 mg/dL (ref 0–200)
HDL: 50.1 mg/dL (ref >39.00)
LDL Cholesterol: 94 mg/dL (ref 0–99)
NonHDL: 106.18
Total CHOL/HDL Ratio: 3
Triglycerides: 62 mg/dL (ref 0.0–149.0)
VLDL: 12.4 mg/dL (ref 0.0–40.0)

## 2020-02-14 LAB — HEMOGLOBIN A1C: Hgb A1c MFr Bld: 6.2 % (ref 4.6–6.5)

## 2020-02-14 LAB — VITAMIN D 25 HYDROXY (VIT D DEFICIENCY, FRACTURES): VITD: 34.98 ng/mL (ref 30.00–100.00)

## 2020-02-14 NOTE — Progress Notes (Signed)
   Covid-19 Vaccination Clinic  Name:  Anita Gibbs    MRN: [id number] DOB: 1971-04-24  02/14/2020  Anita Gibbs was observed post Covid-19 immunization for 15 minutes without incident. She was provided with Vaccine Information Sheet and instruction to access the V-Safe system.   Anita Gibbs was instructed to call 911 with any severe reactions post vaccine: Marland Kitchen Difficulty breathing  . Swelling of face and throat  . A fast heartbeat  . A bad rash all over body  . Dizziness and weakness   Immunizations Administered    Name Date Dose VIS Date Route   Pfizer COVID-19 Vaccine 02/14/2020  3:52 PM 0.3 mL 10/26/2018 Intramuscular   Manufacturer: [place]   Lot: [id number]   [place]: [id number]

## 2020-03-06 ENCOUNTER — Ambulatory Visit: Payer: BC Managed Care – PPO | Admitting: Adult Health

## 2020-03-06 ENCOUNTER — Encounter: Payer: Self-pay | Admitting: Adult Health

## 2020-03-06 ENCOUNTER — Other Ambulatory Visit: Payer: Self-pay

## 2020-03-06 ENCOUNTER — Telehealth: Payer: Self-pay

## 2020-03-06 VITALS — BP 124/84 | HR 69 | Temp 97.9°F | Ht 63.0 in | Wt 250.8 lb

## 2020-03-06 DIAGNOSIS — M5442 Lumbago with sciatica, left side: Secondary | ICD-10-CM

## 2020-03-06 DIAGNOSIS — J302 Other seasonal allergic rhinitis: Secondary | ICD-10-CM

## 2020-03-06 DIAGNOSIS — G4719 Other hypersomnia: Secondary | ICD-10-CM

## 2020-03-06 DIAGNOSIS — R892 Abnormal level of other drugs, medicaments and biological substances in specimens from other organs, systems and tissues: Secondary | ICD-10-CM | POA: Insufficient documentation

## 2020-03-06 DIAGNOSIS — M5441 Lumbago with sciatica, right side: Secondary | ICD-10-CM

## 2020-03-06 DIAGNOSIS — G8929 Other chronic pain: Secondary | ICD-10-CM

## 2020-03-06 DIAGNOSIS — F1290 Cannabis use, unspecified, uncomplicated: Secondary | ICD-10-CM | POA: Insufficient documentation

## 2020-03-06 HISTORY — DX: Other hypersomnia: G47.19

## 2020-03-06 HISTORY — DX: Abnormal level of other drugs, medicaments and biological substances in specimens from other organs, systems and tissues: R89.2

## 2020-03-06 HISTORY — DX: Other seasonal allergic rhinitis: J30.2

## 2020-03-06 NOTE — Patient Instructions (Addendum)
Set up for Home Sleep study .  Work on healthy sleep regimen  Follow up with Primary MD regarding your eye issues  Follow up with Dr. Mortimer Fries or Grace Haggart NP in 2 months and As needed

## 2020-03-06 NOTE — Progress Notes (Signed)
Reviewed and agree with assessment/plan.   Chesley Mires, MD [facility] 03/06/2020, 11:28 AM Pager:  [phone]

## 2020-03-06 NOTE — Telephone Encounter (Signed)
LM  to call office for pre virtual appointment questions 

## 2020-03-06 NOTE — Assessment & Plan Note (Signed)
Patient has chronic/seasonal allergies.  Does have some upper eyelid puffiness which she relates to allergy symptoms.  No visual changes.  Have advised her to follow-up with her primary care provider.  Cool compresses as needed.  If symptoms are not improving or unable to get with her primary care provider will need to go to urgent care.

## 2020-03-06 NOTE — Progress Notes (Signed)
@Patient  ID: Anita Gibbs, female    DOB: 04-Nov-1970, 49 y.o.   MRN: [id number]  Chief Complaint  Patient presents with  . sleep consult    no prior sleep study- c/o loud snoring, restless sleep and daytime sleepiness.     Referring provider: McLean-Scocuzza, Olivia Mackie *  HPI: 49 year old female never smoker presents for sleep consult March 06, 2020 with complaints of daytime sleepiness, snoring Medical history significant for hypertension, hyperlipidemia, allergic rhinitis and seasonal asthma  TEST/EVENTS :   03/06/2020 Sleep consult Patient presents today for a sleep evaluation.  Patient was recently seen by her primary care provider complaining of daytime sleepiness.  Patient's current weight is 250 with a BMI of 44.  Epworth score 18.  Typically goes to sleep for about 8 to 9 hours.  Goes asleep very easily.  Typical bedtime is 6-7 pm  and wakes up at 4 AM.  However always wakes up tired and has significant daytime sleepiness. Does not nap typically . Does nap during weekends. Can fall asleep easily if she is sitting still. Does fall asleep after watching TV for 30 min. Denies Sleep paralysis or cataplexy .  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 Works during [employer] a day .  2 bottle of tea daily . Watches TV prior to bedtime.  Takes 10 min to fall asleep , no issues going to sleep . Wakes up easily to alarm.  Sometimes gets sleepy with driving .  Patient education on sleepiness with driving.  Goes to Pain clinic for chronic pain with neck and back, on gabapentin, mobic , robaxin and oxycodone (takes 1-2 x week) .  Epworth score today is 18  Does complain of chronic allergies, says eyes have been puffy last 2 days since being outside over the holiday weekend. No visual changes. Taking benadryl. Says some better. No pain.     Allergies  Allergen Reactions  . Other Hives    White and red sauces  . Shellfish Allergy Hives  . Strawberry (Diagnostic) Hives  . Tomato Hives    cherry     Immunization History  Administered Date(s) Administered  . Influenza,inj,Quad PF,6+ Mos 05/31/2018, 08/15/2019  . PFIZER SARS-COV-2 Vaccination 01/24/2020, 02/14/2020  . Tdap 10/13/2019    Past Medical History:  Diagnosis Date  . Arthritis   . Carpal boss, right   . Carpal tunnel syndrome, bilateral   . Headache   . Hypertension   . Seasonal asthma 11/29/2018   PMH : Allergic rhinitis   Surgical H: Neck surgery 2018. Knee surgery right 2010.   SH : 2 kids, adult age, never smoker . Rare etoh. No illegal drugs.  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  Single.  Daughter lives in [place]. Grandkids in [place] .  No usual hobbies.  Travels within [place]. No international travel.   FH : + allergies, cancer ? Brother and father  Social History   Tobacco Use  Smoking Status Never Smoker  Smokeless Tobacco Never Used   Counseling given: Not Answered   Outpatient Medications Prior to Visit  Medication Sig Dispense Refill  . albuterol (PROVENTIL HFA;VENTOLIN HFA) 108 (90 Base) MCG/ACT inhaler Inhale 2 puffs into the lungs every 6 (six) hours as needed for wheezing or shortness of breath. 1 Inhaler 5  . amLODipine (NORVASC) 5 MG tablet Take 1 tablet (5 mg total) by mouth daily. 90 tablet 3  . atorvastatin (LIPITOR) 10 MG tablet Take 1 tablet (10 mg total) by mouth  daily at 6 PM. 90 tablet 3  . Cetirizine HCl (ZYRTEC PO) Take 25 mg by mouth.    . Cholecalciferol (VITAMIN D-3) 125 MCG (5000 UT) TABS Take by mouth daily.    . diphenhydrAMINE (BENADRYL) 25 mg capsule Take 25 mg by mouth every 6 (six) hours as needed (for allergies.).    Marland Kitchen EPINEPHrine (AUVI-Q) 0.3 mg/0.3 mL IJ SOAJ injection Use as directed for severe allergic reaction 2 Device 2  . fluticasone (FLONASE) 50 MCG/ACT nasal spray Place 2 sprays into both nostrils daily. 16 g 6  . gabapentin (NEURONTIN) 300 MG capsule Take 1-2 capsules (300-600 mg total) by mouth 3 (three) times daily. 180 capsule 5  . hydrochlorothiazide  (HYDRODIURIL) 25 MG tablet Take 0.5 tablets (12.5 mg total) by mouth daily. In am 90 tablet 3  . losartan (COZAAR) 100 MG tablet Take 1 tablet (100 mg total) by mouth daily. 90 tablet 3  . meloxicam (MOBIC) 15 MG tablet Take 1 tablet (15 mg total) by mouth daily. 30 tablet 1  . methocarbamol (ROBAXIN) 750 MG tablet Take 1 tablet (750 mg total) by mouth every 8 (eight) hours as needed for muscle spasms. 90 tablet 5  . montelukast (SINGULAIR) 10 MG tablet Take 1 tablet (10 mg total) by mouth at bedtime. 30 tablet 5  . norethindrone (AYGESTIN) 5 MG tablet 2 (two) times daily.     Marland Kitchen oxyCODONE (OXY IR/ROXICODONE) 5 MG immediate release tablet Take 1 tablet (5 mg total) by mouth daily as needed for severe pain. Must last 30 days 15 tablet 0  . pantoprazole (PROTONIX) 40 MG tablet Take 1 tablet (40 mg total) by mouth daily. 30 min. Before dinner 90 tablet 3   No facility-administered medications prior to visit.     Review of Systems:   Constitutional:   No  weight loss, night sweats,  Fevers, chills,  +fatigue, or  lassitude.  HEENT:   No headaches,  Difficulty swallowing,  Tooth/dental problems, or  Sore throat,                No sneezing, itching, ear ache,  +nasal congestion, post nasal drip,  Eye lid puffiness. No visual changes.   CV:  No chest pain,  Orthopnea, PND, swelling in lower extremities, anasarca, dizziness, palpitations, syncope.   GI  No heartburn, indigestion, abdominal pain, nausea, vomiting, diarrhea, change in bowel habits, loss of appetite, bloody stools.   Resp: No shortness of breath with exertion or at rest.  No excess mucus, no productive cough,  No non-productive cough,  No coughing up of blood.  No change in color of mucus.  No wheezing.  No chest wall deformity  Skin: no rash or lesions.  GU: no dysuria, change in color of urine, no urgency or frequency.  No flank pain, no hematuria   MS:  No joint pain or swelling.  No decreased range of motion.  + chronic neck  and back pain (goes to pain clinic )     Physical Exam  BP 124/84 (BP Location: Left Arm, Cuff Size: Large)   Pulse 69   Temp 97.9 F (36.6 C) (Temporal)   Ht 5\' 3"  (1.6 m)   Wt 250 lb 12.8 oz (113.8 kg)   SpO2 98%   BMI 44.43 kg/m   GEN: A/Ox3; pleasant , NAD, BMI 44    HEENT:  [place]/AT,  NOSE-clear, THROAT-clear, no lesions, no postnasal drip or exudate noted. Class 2-3 MP airway , Right and left upper  eyelids (R>L)  puffiness, no redness or lesions noted.   NECK:  Supple w/ fair ROM; no JVD; normal carotid impulses w/o bruits; no thyromegaly or nodules palpated; no lymphadenopathy.    RESP  Clear  P & A; w/o, wheezes/ rales/ or rhonchi. no accessory muscle use, no dullness to percussion  CARD:  RRR, no m/r/g, no peripheral edema, pulses intact, no cyanosis or clubbing.  GI:   Soft & nt; nml bowel sounds; no organomegaly or masses detected.   Musco: Warm bil, no deformities or joint swelling noted.   Neuro: alert, no focal deficits noted.    Skin: Warm, no lesions or rashes    Lab Results:  CBC    Component Value Date/Time   WBC 6.8 10/10/2019 0900   RBC 3.92 10/10/2019 0900   HGB 13.3 10/10/2019 0900   HCT 40.2 10/10/2019 0900   PLT 371.0 10/10/2019 0900   MCV 102.5 (H) 10/10/2019 0900   MCH 33.3 04/28/2018 1115   MCHC 33.2 10/10/2019 0900   RDW 14.0 10/10/2019 0900   LYMPHSABS 3.2 10/10/2019 0900   MONOABS 0.4 10/10/2019 0900   EOSABS 0.1 10/10/2019 0900   BASOSABS 0.0 10/10/2019 0900    BMET    Component Value Date/Time   NA 136 02/14/2020 0853   NA 142 09/07/2018 1112   K 4.1 02/14/2020 0853   CL 104 02/14/2020 0853   CO2 27 02/14/2020 0853   GLUCOSE 95 02/14/2020 0853   BUN 17 02/14/2020 0853   BUN 13 09/07/2018 1112   CREATININE 1.05 02/14/2020 0853   CALCIUM 9.1 02/14/2020 0853   GFRNONAA 66 09/07/2018 1112   GFRAA 76 09/07/2018 1112    BMET    Component Value Date/Time   NA 136 02/14/2020 0853   NA 142 09/07/2018 1112   K 4.1  02/14/2020 0853   CL 104 02/14/2020 0853   CO2 27 02/14/2020 0853   GLUCOSE 95 02/14/2020 0853   BUN 17 02/14/2020 0853   BUN 13 09/07/2018 1112   CREATININE 1.05 02/14/2020 0853   CALCIUM 9.1 02/14/2020 0853   GFRNONAA 66 09/07/2018 1112   GFRAA 76 09/07/2018 1112    BNP No results found for: BNP  ProBNP No results found for: PROBNP  Imaging: No results found.    No flowsheet data found.  No results found for: NITRICOXIDE      Assessment & Plan:   Excessive daytime sleepiness Patient has multiple risk factors for obstructive sleep apnea including excessive daytime sleepiness, morbid obesity, snoring and restless sleep.  She has some contributing factors with chronic pain and on multiple sedating medications including gabapentin, Robaxin and oxycodone.  Extensive discussion with patient regarding dangers of sedating medications and daytime sleepiness with driving.  Recommend a home sleep study to be set up.  Patient education on healthy sleep regimen.  And patient education on dangers of sedating medications and driving.  Plan  Patient Instructions  Set up for Home Sleep study .  Work on healthy sleep regimen  Follow up with Primary MD regarding your eye issues  Follow up with Dr. Mortimer Fries or Sharicka Pogorzelski NP in 2 months and As needed       Seasonal allergies Patient has chronic/seasonal allergies.  Does have some upper eyelid puffiness which she relates to allergy symptoms.  No visual changes.  Have advised her to follow-up with her primary care provider.  Cool compresses as needed.  If symptoms are not improving or unable to get with her primary care  provider will need to go to urgent care.  Chronic low back pain [facility]) (Bilateral) (R>L) w/ sciatica (Bilateral) Chronic pain syndrome followed by the pain center.  Patient has daytime sleepiness and is on chronic medications that are sedating including gabapentin Robaxin and oxycodone.  Patient education on  dangers of driving with chronic pain medicines. See above for upcoming sleep study recommendations.     Rexene Edison, NP 03/06/2020 .

## 2020-03-06 NOTE — Assessment & Plan Note (Signed)
Patient has multiple risk factors for obstructive sleep apnea including excessive daytime sleepiness, morbid obesity, snoring and restless sleep.  She has some contributing factors with chronic pain and on multiple sedating medications including gabapentin, Robaxin and oxycodone.  Extensive discussion with patient regarding dangers of sedating medications and daytime sleepiness with driving.  Recommend a home sleep study to be set up.  Patient education on healthy sleep regimen.  And patient education on dangers of sedating medications and driving.  Plan  Patient Instructions  Set up for Home Sleep study .  Work on healthy sleep regimen  Follow up with Primary MD regarding your eye issues  Follow up with Dr. Mortimer Fries or Tiffiny Worthy NP in 2 months and As needed

## 2020-03-06 NOTE — Assessment & Plan Note (Signed)
Chronic pain syndrome followed by the pain center.  Patient has daytime sleepiness and is on chronic medications that are sedating including gabapentin Robaxin and oxycodone.  Patient education on dangers of driving with chronic pain medicines. See above for upcoming sleep study recommendations.

## 2020-03-07 ENCOUNTER — Encounter: Payer: Self-pay | Admitting: Internal Medicine

## 2020-03-07 ENCOUNTER — Ambulatory Visit (INDEPENDENT_AMBULATORY_CARE_PROVIDER_SITE_OTHER): Payer: BC Managed Care – PPO | Admitting: Internal Medicine

## 2020-03-07 ENCOUNTER — Other Ambulatory Visit: Payer: Self-pay

## 2020-03-07 ENCOUNTER — Ambulatory Visit: Payer: BC Managed Care – PPO | Attending: Pain Medicine | Admitting: Pain Medicine

## 2020-03-07 VITALS — BP 124/84 | HR 83 | Temp 99.6°F | Ht 63.0 in | Wt 251.2 lb

## 2020-03-07 DIAGNOSIS — Z9109 Other allergy status, other than to drugs and biological substances: Secondary | ICD-10-CM | POA: Diagnosis not present

## 2020-03-07 DIAGNOSIS — Z91018 Allergy to other foods: Secondary | ICD-10-CM | POA: Insufficient documentation

## 2020-03-07 DIAGNOSIS — Z9114 Patient's other noncompliance with medication regimen: Secondary | ICD-10-CM

## 2020-03-07 DIAGNOSIS — F1290 Cannabis use, unspecified, uncomplicated: Secondary | ICD-10-CM

## 2020-03-07 DIAGNOSIS — Z91148 Patient's other noncompliance with medication regimen for other reason: Secondary | ICD-10-CM | POA: Insufficient documentation

## 2020-03-07 DIAGNOSIS — Z87898 Personal history of other specified conditions: Secondary | ICD-10-CM

## 2020-03-07 DIAGNOSIS — H01119 Allergic dermatitis of unspecified eye, unspecified eyelid: Secondary | ICD-10-CM | POA: Diagnosis not present

## 2020-03-07 DIAGNOSIS — H1033 Unspecified acute conjunctivitis, bilateral: Secondary | ICD-10-CM | POA: Diagnosis not present

## 2020-03-07 DIAGNOSIS — H02843 Edema of right eye, unspecified eyelid: Secondary | ICD-10-CM

## 2020-03-07 DIAGNOSIS — F1991 Other psychoactive substance use, unspecified, in remission: Secondary | ICD-10-CM

## 2020-03-07 HISTORY — DX: Other psychoactive substance use, unspecified, in remission: F19.91

## 2020-03-07 HISTORY — DX: Allergy to other foods: Z91.018

## 2020-03-07 MED ORDER — METHYLPREDNISOLONE ACETATE 40 MG/ML IJ SUSP
40.0000 mg | Freq: Once | INTRAMUSCULAR | Status: AC
Start: 2020-03-07 — End: 2020-03-07
  Administered 2020-03-07: 40 mg via INTRAMUSCULAR

## 2020-03-07 MED ORDER — HYDROCORTISONE 2.5 % EX CREA: TOPICAL_CREAM | Freq: Two times a day (BID) | CUTANEOUS | 0 refills | Status: DC

## 2020-03-07 MED ORDER — OLOPATADINE HCL 0.2 % OP SOLN: 1.0000 [drp] | Freq: Every day | OPHTHALMIC | 11 refills | Status: DC

## 2020-03-07 MED ORDER — ERYTHROMYCIN 5 MG/GM OP OINT: 1.0000 "application " | TOPICAL_OINTMENT | Freq: Three times a day (TID) | OPHTHALMIC | 0 refills | Status: DC

## 2020-03-07 NOTE — Patient Instructions (Signed)
____________________________________________________________________________________________  Medication Rules  Purpose: To inform patients, and their family members, of our rules and regulations.  Applies to: All patients receiving prescriptions (written or electronic).  Pharmacy of record: Pharmacy where electronic prescriptions will be sent. If written prescriptions are taken to a different pharmacy, please inform the nursing staff. The pharmacy listed in the electronic medical record should be the one where you would like electronic prescriptions to be sent.  Electronic prescriptions: In compliance with the [place] Strengthen Opioid Misuse Prevention (STOP) Act of 2017 (Session Law 2017-74/H243), effective September 01, 2018, all controlled substances must be electronically prescribed. Calling prescriptions to the pharmacy will cease to exist.  Prescription refills: Only during scheduled appointments. Applies to all prescriptions.  NOTE: The following applies primarily to controlled substances (Opioid* Pain Medications).   Type of encounter (visit): For patients receiving controlled substances, face-to-face visits are required. (Not an option or up to the patient.)  Patient's responsibilities: 1. Pain Pills: Bring all pain pills to every appointment (except for procedure appointments). 2. Pill Bottles: Bring pills in original pharmacy bottle. Always bring the newest bottle. Bring bottle, even if empty. 3. Medication refills: You are responsible for knowing and keeping track of what medications you take and those you need refilled. The day before your appointment: write a list of all prescriptions that need to be refilled. The day of the appointment: give the list to the admitting nurse. Prescriptions will be written only during appointments. No prescriptions will be written on procedure days. If you forget a medication: it will not be "Called in", "Faxed", or "electronically sent".  You will need to get another appointment to get these prescribed. No early refills. Do not call asking to have your prescription filled early. 4. Prescription Accuracy: You are responsible for carefully inspecting your prescriptions before leaving our office. Have the discharge nurse carefully go over each prescription with you, before taking them home. Make sure that your name is accurately spelled, that your address is correct. Check the name and dose of your medication to make sure it is accurate. Check the number of pills, and the written instructions to make sure they are clear and accurate. Make sure that you are given enough medication to last until your next medication refill appointment. 5. Taking Medication: Take medication as prescribed. When it comes to controlled substances, taking less pills or less frequently than prescribed is permitted and encouraged. Never take more pills than instructed. Never take medication more frequently than prescribed.  6. Inform other Doctors: Always inform, all of your healthcare providers, of all the medications you take. 7. Pain Medication from other Providers: You are not allowed to accept any additional pain medication from any other Doctor or Healthcare provider. There are two exceptions to this rule. (see below) In the event that you require additional pain medication, you are responsible for notifying us, as stated below. 8. Medication Agreement: You are responsible for carefully reading and following our Medication Agreement. This must be signed before receiving any prescriptions from our practice. Safely store a copy of your signed Agreement. Violations to the Agreement will result in no further prescriptions. (Additional copies of our Medication Agreement are available upon request.) 9. Laws, Rules, & Regulations: All patients are expected to follow all Federal and State Laws, Statutes, Rules, & Regulations. Ignorance of the Laws does not constitute a  valid excuse.  10. Illegal drugs and Controlled Substances: The use of illegal substances (including, but not limited to marijuana and its   derivatives) and/or the illegal use of any controlled substances is strictly prohibited. Violation of this rule may result in the immediate and permanent discontinuation of any and all prescriptions being written by our practice. The use of any illegal substances is prohibited. 11. Adopted CDC guidelines & recommendations: Target dosing levels will be at or below 60 MME/day. Use of benzodiazepines** is not recommended.  Exceptions: There are only two exceptions to the rule of not receiving pain medications from other Healthcare Providers. 1. Exception #1 (Emergencies): In the event of an emergency (i.e.: accident requiring emergency care), you are allowed to receive additional pain medication. However, you are responsible for: As soon as you are able, call our office [phone], at any time of the day or night, and leave a message stating your name, the date and nature of the emergency, and the name and dose of the medication prescribed. In the event that your call is answered by a member of our staff, make sure to document and save the date, time, and the name of the person that took your information.  2. Exception #2 (Planned Surgery): In the event that you are scheduled by another doctor or dentist to have any type of surgery or procedure, you are allowed (for a period no longer than 30 days), to receive additional pain medication, for the acute post-op pain. However, in this case, you are responsible for picking up a copy of our "Post-op Pain Management for Surgeons" handout, and giving it to your surgeon or dentist. This document is available at our office, and does not require an appointment to obtain it. Simply go to our office during business hours (Monday-Thursday from 8:00 AM to 4:00 PM) (Friday 8:00 AM to 12:00 Noon) or if you have a scheduled appointment  with us, prior to your surgery, and ask for it by name. In addition, you will need to provide us with your name, name of your surgeon, type of surgery, and date of procedure or surgery.  *Opioid medications include: morphine, codeine, oxycodone, oxymorphone, hydrocodone, hydromorphone, meperidine, tramadol, tapentadol, buprenorphine, fentanyl, methadone. **Benzodiazepine medications include: diazepam (Valium), alprazolam (Xanax), clonazepam (Klonopine), lorazepam (Ativan), clorazepate (Tranxene), chlordiazepoxide (Librium), estazolam (Prosom), oxazepam (Serax), temazepam (Restoril), triazolam (Halcion) (Last updated: 10/29/2017) ____________________________________________________________________________________________    

## 2020-03-07 NOTE — Progress Notes (Signed)
Chief Complaint  Patient presents with  . Eye Problem   F/u  1.Monday right eyelid more swollen than left and eyelids itchy tried benadryl/ice/zyrtec h/o allergy testing in [place] and had welps with allergy testing tried to avoid beef and pork and only eating fish and chicken and [place] for now no new fruits. Eyes have some discharge  She normally wears contacts but not wearing for now she woke up with eyelid swelling  Review of Systems  Eyes:       +eyelid swelling less but right >left  Respiratory: Negative for shortness of breath.   Cardiovascular: Negative for chest pain.  Skin: Positive for itching.   Past Medical History:  Diagnosis Date  . Arthritis   . Carpal boss, right   . Carpal tunnel syndrome, bilateral   . Headache   . Hypertension   . Seasonal asthma 11/29/2018   Past Surgical History:  Procedure Laterality Date  . ANTERIOR CERVICAL DECOMP/DISCECTOMY FUSION N/A 07/01/2017   Procedure: ANTERIOR CERVICAL DECOMPRESSION/DISCECTOMY FUSION 3 LEVELS;  Surgeon: Meade Maw, MD;  Location: [facility] ORS;  Service: Neurosurgery;  Laterality: N/A;  . KNEE ARTHROSCOPY Right    Family History  Problem Relation Age of Onset  . [name] Mother   . Diabetes Mother   . Arthritis Mother   . Hypertension Mother   . Hyperlipidemia Mother   . Hypertension Father   . Arthritis Father   . Hearing loss Father   . Kidney disease Father   . Lymphoma Father   . Arthritis Sister   . Hypertension Sister   . Cancer Brother        ? lung due to exposure   . COPD Brother   . Early death Brother   . COPD Son   . Depression Son   . Breast cancer Maternal Aunt   . Breast cancer Paternal Aunt   . Sleep apnea Daughter   . Allergic rhinitis Neg Hx   . Angioedema Neg Hx   . Eczema Neg Hx   . Urticaria Neg Hx   . Asthma Neg Hx    Social History   Socioeconomic History  . Marital status: Widowed    Spouse name: Not on file  . Number of children: Not on file  . Years of education:  Not on file  . Highest education level: Not on file  Occupational History  . Not on file  Tobacco Use  . Smoking status: Never Smoker  . Smokeless tobacco: Never Used  Vaping Use  . Vaping Use: Never used  Substance and Sexual Activity  . Alcohol use: Yes    Comment: occ  . Drug use: No  . Sexual activity: Yes    Birth control/protection: None  Other Topics Concern  . Not on file  Social History Narrative   Bus driver    And school custodian    1 daughter lives in [place] 75 y.o as of 01/17/20   1 son 62 almost 69 in [place] Strain:   . Difficulty of Paying Living Expenses:   Food Insecurity:   . Worried About Charity fundraiser in the Last Year:   . Arboriculturist in the Last Year:   Transportation Needs:   . Film/video editor (Medical):   Marland Kitchen Lack of Transportation (Non-Medical):   Physical Activity:   . Days of Exercise per Week:   . Minutes of Exercise per Session:  Stress:   . Feeling of Stress :   Social Connections:   . Frequency of Communication with Friends and Family:   . Frequency of Social Gatherings with Friends and Family:   . Attends Religious Services:   . Active Member of Clubs or Organizations:   . Attends Archivist Meetings:   Marland Kitchen Marital Status:   Intimate Partner Violence:   . Fear of Current or Ex-Partner:   . Emotionally Abused:   Marland Kitchen Physically Abused:   . Sexually Abused:    Current Meds  Medication Sig  . albuterol (PROVENTIL HFA;VENTOLIN HFA) 108 (90 Base) MCG/ACT inhaler Inhale 2 puffs into the lungs every 6 (six) hours as needed for wheezing or shortness of breath.  Marland Kitchen amLODipine (NORVASC) 5 MG tablet Take 1 tablet (5 mg total) by mouth daily.  Marland Kitchen atorvastatin (LIPITOR) 10 MG tablet Take 1 tablet (10 mg total) by mouth daily at 6 PM.  . Cetirizine HCl (ZYRTEC PO) Take 25 mg by mouth.  . Cholecalciferol (VITAMIN D-3) 125 MCG (5000 UT) TABS Take by mouth daily.   . diphenhydrAMINE (BENADRYL) 25 mg capsule Take 25 mg by mouth every 6 (six) hours as needed (for allergies.).  Marland Kitchen EPINEPHrine (AUVI-Q) 0.3 mg/0.3 mL IJ SOAJ injection Use as directed for severe allergic reaction  . fluticasone (FLONASE) 50 MCG/ACT nasal spray Place 2 sprays into both nostrils daily.  Marland Kitchen gabapentin (NEURONTIN) 300 MG capsule Take 1-2 capsules (300-600 mg total) by mouth 3 (three) times daily.  . hydrochlorothiazide (HYDRODIURIL) 25 MG tablet Take 0.5 tablets (12.5 mg total) by mouth daily. In am  . losartan (COZAAR) 100 MG tablet Take 1 tablet (100 mg total) by mouth daily.  . meloxicam (MOBIC) 15 MG tablet Take 1 tablet (15 mg total) by mouth daily.  . methocarbamol (ROBAXIN) 750 MG tablet Take 1 tablet (750 mg total) by mouth every 8 (eight) hours as needed for muscle spasms.  . montelukast (SINGULAIR) 10 MG tablet Take 1 tablet (10 mg total) by mouth at bedtime.  . norethindrone (AYGESTIN) 5 MG tablet 2 (two) times daily.   . pantoprazole (PROTONIX) 40 MG tablet Take 1 tablet (40 mg total) by mouth daily. 30 min. Before dinner   Allergies  Allergen Reactions  . Other Hives    White and red sauces  . Shellfish Allergy Hives  . Strawberry (Diagnostic) Hives  . Tomato Hives    cherry   Recent Results (from the past 2160 hour(s))  ToxASSURE Select 13 (MW), Urine     Status: None   Collection Time: 02/01/20 12:00 PM  Result Value Ref Range   Summary Note     Comment: ==================================================================== ToxASSURE Select 13 (MW) ==================================================================== Test                             Result       Flag       Units Drug Present not Declared for Prescription Verification   Carboxy-THC                    20           UNEXPECTED ng/mg creat    Carboxy-THC is a metabolite of tetrahydrocannabinol (THC). Source of    THC is most commonly herbal marijuana or marijuana-based products,    but THC is  also present in a scheduled prescription medication.    Trace amounts of THC can be present in  hemp and cannabidiol (CBD)    products. This test is not intended to distinguish between delta-9-    tetrahydrocannabinol, the predominant form of THC in most herbal or    marijuana-based products, and delta-8-tetrahydrocannabinol. Drug Absent but Declared for Prescription Verification   Oxycodone                      Not Detected UNEXPECTED ng/mg creat =========================== ========================================= Test                      Result    Flag   Units      Ref Range   Creatinine              230              mg/dL      >=20 ==================================================================== Declared Medications:  The flagging and interpretation on this report are based on the  following declared medications.  Unexpected results may arise from  inaccuracies in the declared medications.  **Note: The testing scope of this panel includes these medications:  Oxycodone (Roxicodone)  **Note: The testing scope of this panel does not include the  following reported medications:  Albuterol (Ventolin HFA)  Amlodipine (Norvasc)  Atorvastatin (Lipitor)  Cetirizine  Diphenhydramine (Benadryl)  Epinephrine (EpiPen)  Fluticasone (Flonase)  Gabapentin (Neurontin)  Hydrochlorothiazide  Losartan (Cozaar)  Meloxicam (Mobic)  Methocarbamol (Robaxin)  Montelukast (Singulair)  Norethindrone (Aygestin)  Pantoprazole (Protonix)  Vitamin D3  ==================================================================== For clinical consultation, please call [phone]. ====================================================================   Vitamin D (25 hydroxy)     Status: None   Collection Time: 02/14/20  8:53 AM  Result Value Ref Range   VITD 34.98 30.00 - 100.00 ng/mL  Lipid panel     Status: None   Collection Time: 02/14/20  8:53 AM  Result Value Ref Range   Cholesterol 156 0 - 200  mg/dL    Comment: ATP III Classification       Desirable:  < 200 mg/dL               Borderline High:  200 - 239 mg/dL          High:  > = 240 mg/dL   Triglycerides 62.0 0 - 149 mg/dL    Comment: Normal:  <150 mg/dLBorderline High:  150 - 199 mg/dL   HDL 50.10 >39.00 mg/dL   VLDL 12.4 0.0 - 40.0 mg/dL   LDL Cholesterol 94 0 - 99 mg/dL   Total CHOL/HDL Ratio 3     Comment:                Men          Women1/2 Average Risk     3.4          3.3Average Risk          5.0          4.42X Average Risk          9.6          7.13X Average Risk          15.0          11.0                       NonHDL 106.18     Comment: NOTE:  Non-HDL goal should be 30 mg/dL higher than patient's LDL goal (i.e. LDL goal of < 70 mg/dL, would have  non-HDL goal of < 100 mg/dL)  Hemoglobin A1C     Status: None   Collection Time: 02/14/20  8:53 AM  Result Value Ref Range   Hgb A1c MFr Bld 6.2 4.6 - 6.5 %    Comment: Glycemic Control Guidelines for People with Diabetes:Non Diabetic:  <6%Goal of Therapy: <7%Additional Action Suggested:  >8%   Comp Met (CMET)     Status: Abnormal   Collection Time: 02/14/20  8:53 AM  Result Value Ref Range   Sodium 136 135 - 145 mEq/L   Potassium 4.1 3.5 - 5.1 mEq/L   Chloride 104 96 - 112 mEq/L   CO2 27 19 - 32 mEq/L   Glucose, Bld 95 70 - 99 mg/dL   BUN 17 6 - 23 mg/dL   Creatinine, Ser 1.05 0.40 - 1.20 mg/dL   Total Bilirubin 0.4 0.2 - 1.2 mg/dL   Alkaline Phosphatase 36 (L) 39 - 117 U/L   AST 14 0 - 37 U/L   ALT 17 0 - 35 U/L   Total Protein 6.9 6.0 - 8.3 g/dL   Albumin 3.9 3.5 - 5.2 g/dL   GFR 68.09 >60.00 mL/min   Calcium 9.1 8.4 - 10.5 mg/dL   Objective  Body mass index is 44.5 kg/m. Wt Readings from Last 3 Encounters:  03/07/20 251 lb 3.2 oz (113.9 kg)  03/06/20 250 lb 12.8 oz (113.8 kg)  02/02/20 245 lb (111.1 kg)   Temp Readings from Last 3 Encounters:  03/07/20 99.6 F (37.6 C) (Oral)  03/06/20 97.9 F (36.6 C) (Temporal)  02/02/20 99.9 F (37.7 C)   BP  Readings from Last 3 Encounters:  03/07/20 124/84  03/06/20 124/84  02/02/20 (!) 111/52   Pulse Readings from Last 3 Encounters:  03/07/20 83  03/06/20 69  02/02/20 86    Physical Exam Vitals and nursing note reviewed.  Constitutional:      Appearance: Normal appearance. She is well-developed and well-groomed. She is morbidly obese.  HENT:     Head: Normocephalic and atraumatic.     Comments: B/l eyelid swelling less but right >left Eyes:     Pupils: Pupils are equal, round, and reactive to light.     Comments: Injected conjuctiva  Cardiovascular:     Rate and Rhythm: Normal rate and regular rhythm.     Heart sounds: Normal heart sounds. No murmur heard.   Pulmonary:     Effort: Pulmonary effort is normal.     Breath sounds: Normal breath sounds.  Skin:    General: Skin is warm and dry.  Neurological:     General: No focal deficit present.     Mental Status: She is alert and oriented to person, place, and time. Mental status is at baseline.     Gait: Gait normal.  Psychiatric:        Attention and Perception: Attention and perception normal.        Mood and Affect: Mood and affect normal.        Speech: Speech normal.        Behavior: Behavior normal. Behavior is cooperative.        Thought Content: Thought content normal.        Cognition and Memory: Cognition and memory normal.        Judgment: Judgment normal.     Assessment  Plan  Acute conjunctivitis of both eyes, unspecified acute conjunctivitis type - Plan: hydrocortisone 2.5 % cream, erythromycin ophthalmic ointment, Olopatadine HCl 0.2 % SOLN  Eyelid dermatitis, allergic/contact with eyelid swelling - Plan: hydrocortisone 2.5 % cream depomedrol 40 x 1  Multiple food allergies - Plan: Ambulatory referral to Allergy  Environmental allergies - Plan: Ambulatory referral to Allergy  Referred to allergist in [facility] Dr. Zena Amos Provider: Dr. Olivia Mackie McLean-Scocuzza-[facility] Medicine

## 2020-03-07 NOTE — Addendum Note (Signed)
Addended by: Thressa Sheller on: 03/07/2020 12:01 PM   Modules accepted: Orders

## 2020-03-07 NOTE — Progress Notes (Signed)
Unsuccessful attempt to contact patient for Virtual Visit (Pain Management Telehealth)   Patient provided contact information:  [phone] (home); [phone] (mobile); (Preferred) [phone] [email or web address]   Pre-screening:  Our staff was unsuccessful in contacting Ms. Gambino using the above provided information.   I unsuccessfully attempted to make contact with [name] on 03/07/2020 via telephone. I was unable to complete the virtual encounter due to constant busy signal. I was unable to leave a message.  Unfortunately, the patient has UDS shows a violation of our medication rules and regulations and therefore today we will discontinue the use of opioid analgesics as part of her plan of care.  Since the patient is dose of oxycodone was low, there is no need for any interventions to prevent withdrawals.  In addition we had to point out that among the abnormalities seen, in her urine drug screening test, is the fact that there was no oxycodone found.  If the patient is stable on her Robaxin and Neurontin, we will consider transferring these medications to the patient's PCP.  Pharmacotherapy Assessment  Analgesic: Abnormal UDS (02/01/20) (+) undisclosed THC (marijuana). Opioid therapy terminated 03/07/2020.  Oxycodone IR 5 mg, 1 tab PO QOD (2.5 mg/day of oxycodone) (last filled on 01/23/2020) MME/day:7.5mg /day.   Follow-up plan:   Reschedule Visit.     Interventional management options:  Considering:   NOTE: Shellfish allergy Possible bilateral cervical facetRFA Diagnostic right L3-4 LESI Diagnostic bilateral L3 vs L4 TFESI  Possible bilateral lumbar facetRFA #1(starting with the left side) Diagnostic right vs bilateral SI joint injection Possible right vs bilateral SI jointRFA   Palliative PRN treatment(s):   Diagnostic/therapeutic bilateral lumbar facet block #4 (100/100/100) (100/100/100) (100/100/30) Palliative right CESI #4  Diagnostic bilateral  cervicalfacet block #2     Recent Visits Date Type Provider Dept  02/02/20 Office Visit Milinda Pointer, MD [facility]  Showing recent visits within past 90 days and meeting all other requirements Today's Visits Date Type Provider Dept  03/07/20 Telemedicine Milinda Pointer, MD [facility]  Showing today's visits and meeting all other requirements Future Appointments No visits were found meeting these conditions. Showing future appointments within next 90 days and meeting all other requirements   Note by: Gaspar Cola, MD Date: 03/07/2020; Time: 10:19 AM

## 2020-03-07 NOTE — Progress Notes (Signed)
Patient presenting with bilateral eye swelling today. The right eye has been worse than the left. Patient has been self treating with ice, Benadryl, and Zyrtec. Swelling has since gone down but still present.   Swelling started Monday. Patient had been sitting outside at a family event on Sunday. Patient wears contacts and has a history of allergies. Was not wearing her contacts on Sunday or Monday.   Patient flagged: Current status:  PATIENT IS OVERDUE FOR BMI FOLLOW UP PLAN BMI is estimated to be 44.5 based on the last recorded weight and height

## 2020-03-07 NOTE — Patient Instructions (Addendum)
[facility] Dr. Luz Brazen good reviews  [phone] [address] [place]    Allergic Conjunctivitis A clear membrane (conjunctiva) covers the white part of your eye and the inner surface of your eyelid. Allergic conjunctivitis happens when this membrane has inflammation. This is caused by allergies. Common causes of allergic reactions (allergens) include:  Outdoor allergens, such as: ? Pollen. ? Grass and weeds. ? Mold spores.  Indoor allergens, such as: ? Dust. ? Smoke. ? Mold. ? Pet dander. ? Animal hair. This condition can make your eye red or pink. It can also make your eye feel itchy. This condition cannot be spread from one person to another person (is not contagious). Follow these instructions at home:  Try not to be around things that you are allergic to.  Take or apply over-the-counter and prescription medicines only as told by your doctor. These include any eye drops.  Place a cool, clean washcloth on your eye for 10-20 minutes. Do this 3-4 times a day.  Do not touch or rub your eyes.  Do not wear contact lenses until the inflammation is gone. Wear glasses instead.  Do not wear eye makeup until the inflammation is gone.  Keep all follow-up visits as told by your doctor. This is important. Contact a doctor if:  Your symptoms get worse.  Your symptoms do not get better with treatment.  You have mild eye pain.  You are sensitive to light,  You have spots or blisters on your eyes.  You have pus coming from your eye.  You have a fever. Get help right away if:  You have redness, swelling, or other symptoms in only one eye.  Your vision is blurry.  You have vision changes.  You have very bad eye pain. Summary  Allergic conjunctivitis is caused by allergies. It can make your eye red or pink, and it can make your eye feel itchy.  This condition cannot be spread from one person to another person (is not  contagious).  Try not to be around things that you are allergic to.  Take or apply over-the-counter and prescription medicines only as told by your doctor. These include any eye drops.  Contact your doctor if your symptoms get worse or they do not get better with treatment. This information is not intended to replace advice given to you by your health care provider. Make sure you discuss any questions you have with your health care provider. Document Revised: 12/07/2018 Document Reviewed: 04/11/2016 Elsevier Patient Education  [place], Adult An allergy is when your body's defense system (immune system) overreacts to an otherwise harmless substance (allergen) that you breathe in or eat or something that touches your skin. When you come into contact with something that you are allergic to, your immune system produces certain proteins (antibodies). These proteins cause cells to release chemicals (histamines) that trigger the symptoms of an allergic reaction. Allergies often affect the nasal passages (allergic rhinitis), eyes (allergic conjunctivitis), skin (atopic dermatitis), and stomach. Allergies can be mild or severe. Allergies cannot spread from person to person (are not contagious). They can develop at any age and may be outgrown. What increases the risk? You may be at greater risk of allergies if other people in your family have allergies. What are the signs or symptoms? Symptoms depend on what type of allergy you have. They may include:  Runny, stuffy nose.  Sneezing.  Itchy mouth, ears, or  throat.  Postnasal drip.  Sore throat.  Itchy, red, watery, or puffy eyes.  Skin rash or hives.  Stomach pain.  Vomiting.  Diarrhea.  Bloating.  Wheezing or coughing. People with a severe allergy to food, medicine, or an insect bite may have a life-threatening allergic reaction (anaphylaxis). Symptoms of anaphylaxis include:  Hives.  Itching.  Flushed  face.  Swollen lips, tongue, or mouth.  Tight or swollen throat.  Chest pain or tightness in the chest.  Trouble breathing or shortness of breath.  Rapid heartbeat.  Dizziness or fainting.  Vomiting.  Diarrhea.  Pain in the abdomen. How is this diagnosed? This condition is diagnosed based on:  Your symptoms.  Your family and medical history.  A physical exam. You may need to see a health care provider who specializes in treating allergies (allergist). You may also have tests, including:  Skin tests to see which allergens are causing your symptoms, such as: ? Skin prick test. In this test, your skin is pricked with a tiny needle and exposed to small amounts of possible allergens to see if your skin reacts. ? Intradermal skin test. In this test, a small amount of allergen is injected under your skin to see if your skin reacts. ? Patch test. In this test, a small amount of allergen is placed on your skin and then your skin is covered with a bandage. Your health care provider will check your skin after a couple of days to see if a rash has developed.  Blood tests.  Challenges tests. In this test, you inhale a small amount of allergen by mouth to see if you have an allergic reaction. You may also be asked to:  Keep a food diary. A food diary is a record of all the foods and drinks you have in a day and any symptoms you experience.  Practice an elimination diet. An elimination diet involves eliminating specific foods from your diet and then adding them back in one by one to find out if a certain food causes an allergic reaction. How is this treated? Treatment for allergies depends on your symptoms. Treatment may include:  Cold compresses to soothe itching and swelling.  Eye drops.  Nasal sprays.  Using a saline spray or container (neti pot) to flush out the nose (nasal irrigation). These methods can help clear away mucus and keep the nasal passages moist.  Using a  humidifier.  Oral antihistamines or other medicines to block allergic reaction and inflammation.  Skin creams to treat rashes or itching.  Diet changes to eliminate food allergy triggers.  Repeated exposure to tiny amounts of allergens to build up a tolerance and prevent future allergic reactions (immunotherapy). These include: ? Allergy shots. ? Oral treatment. This involves taking small doses of an allergen under the tongue (sublingual immunotherapy).  Emergency epinephrine injection (auto-injector) in case of an allergic emergency. This is a self-injectable, pre-measured medicine that must be given within the first few minutes of a serious allergic reaction. Follow these instructions at home:         Avoid known allergens whenever possible.  If you suffer from airborne allergens, wash out your nose daily. You can do this with a saline spray or a neti pot to flush out your nose (nasal irrigation).  Take over-the-counter and prescription medicines only as told by your health care provider.  Keep all follow-up visits as told by your health care provider. This is important.  If you are at risk of a  severe allergic reaction (anaphylaxis), keep your auto-injector with you at all times.  If you have ever had anaphylaxis, wear a medical alert bracelet or necklace that states you have a severe allergy. Contact a health care provider if:  Your symptoms do not improve with treatment. Get help right away if:  You have symptoms of anaphylaxis, such as: ? Swollen mouth, tongue, or throat. ? Pain or tightness in your chest. ? Trouble breathing or shortness of breath. ? Dizziness or fainting. ? Severe abdominal pain, vomiting, or diarrhea. This information is not intended to replace advice given to you by your health care provider. Make sure you discuss any questions you have with your health care provider. Document Revised: 11/11/2017 Document Reviewed: 03/05/2016 Elsevier Patient  Education  [place].

## 2020-03-13 ENCOUNTER — Ambulatory Visit: Payer: BC Managed Care – PPO | Attending: Pain Medicine | Admitting: Physical Therapy

## 2020-03-13 DIAGNOSIS — G8929 Other chronic pain: Secondary | ICD-10-CM | POA: Insufficient documentation

## 2020-03-13 DIAGNOSIS — M545 Low back pain: Secondary | ICD-10-CM | POA: Insufficient documentation

## 2020-03-13 DIAGNOSIS — M6281 Muscle weakness (generalized): Secondary | ICD-10-CM | POA: Insufficient documentation

## 2020-03-14 ENCOUNTER — Ambulatory Visit: Payer: BC Managed Care – PPO | Admitting: Physical Therapy

## 2020-03-14 ENCOUNTER — Other Ambulatory Visit: Payer: Self-pay

## 2020-03-14 ENCOUNTER — Encounter: Payer: Self-pay | Admitting: Physical Therapy

## 2020-03-14 DIAGNOSIS — M6281 Muscle weakness (generalized): Secondary | ICD-10-CM

## 2020-03-14 DIAGNOSIS — M545 Low back pain, unspecified: Secondary | ICD-10-CM

## 2020-03-14 DIAGNOSIS — G8929 Other chronic pain: Secondary | ICD-10-CM | POA: Diagnosis present

## 2020-03-14 NOTE — Therapy (Signed)
[facility] [address], [place], [address] Phone: [phone]   Fax:  [phone]  Physical Therapy Evaluation  Patient Details  Name: Anita Gibbs MRN: [id number] Date of Birth: [birth year] Referring Provider (PT): Milinda Pointer, MD   Encounter Date: 03/14/2020   PT End of Session - 03/15/20 1630    Visit Number 1    Number of Visits 12    Date for PT Re-Evaluation 04/26/20    Authorization Type [employer] reporting period from 03/14/2020    Progress Note Due on Visit 10    PT Start Time 1730    PT Stop Time 1830    PT Time Calculation (min) 60 min    Activity Tolerance Patient tolerated treatment well;Patient limited by pain    Behavior During Therapy [facility] for tasks assessed/performed           Past Medical History:  Diagnosis Date  . Arthritis   . Carpal boss, right   . Carpal tunnel syndrome, bilateral   . Headache   . Hypertension   . Seasonal asthma 11/29/2018    Past Surgical History:  Procedure Laterality Date  . ANTERIOR CERVICAL DECOMP/DISCECTOMY FUSION N/A 07/01/2017   Procedure: ANTERIOR CERVICAL DECOMPRESSION/DISCECTOMY FUSION 3 LEVELS;  Surgeon: Meade Maw, MD;  Location: [facility] ORS;  Service: Neurosurgery;  Laterality: N/A;  . KNEE ARTHROSCOPY Right     There were no vitals filed for this visit.    Subjective Assessment - 03/14/20 1750    Subjective Patient reports she has back and neck pain that hurts all the way up and down. Her back pain is throbbing all day and occasionally her R leg goes numb and the knee will go out. She has had back pain for about a year. The only thing she can think of is that her husband was disabled and she was caring for him with dependent care for 2 years. Back pain came on to a point where she couldn't move, so she went to the ER. That's when she found out about her neck. She had to get three discs removed from her back, then  came the back pain. Had only had neck surgery. She has had some type of shots in her back, that last about 1 month and then the pain comes back. She has done this several time. The first week her pain goes completely away. Her back and neck pain comes and goes at the same time. Reports no history of injury to the neck. She has numbness down both arms, L > R. It is constantly present. L arm paresthesia all the way to the finger tips. Is intermittent in the forearm and distally. R hand intermittently goes numb. Patient reports pain and paresthesia down both LEs. R leg pain and numbness is comes down the lateral leg to the anterior lower leg to the toes, R glute cramps. R leg numbness is intermittent, paresthesia lingers.  L LE pain is just in the left foot with swelling. Also has numbness there. Travels lateral leg and to the front and dorsal and plantar aspect of L foot. Comes and goes. No left glute cramping. No history of injury to either leg. Had R knee arthroscopy (helped for a little bit).  Does not get too many headaches. Foot doctor did not find anything wrong with the L ankle (gave shot that helped). She gets the shakes in her hands sometimes, for example when she gets up  in the morning. Most of the time cannot funciton without a back brace (wearing today). Patient reports she missed her appointment yesterday because she felt like she could not move and was so numb. This was after work yesterday. She usually tries to walk 30 min after work. She has missed work for this. Her doctors know about it and have said to go to the ER. It gets better 45-60 min. Puts a ice pack, heating pad, and take oxycodone and muscle relaxants and that helps. Episodes happen 2-3 times a week. Have been happening the lat 6 months. Denies incontinence, vision changes, dizziness. Just has to lay there. States this is why she did not attend her PT appointment yesterday.    Pertinent History Patient is a 49 y.o. female who presents to  outpatient physical therapy with a referral for medical diagnosis chronic bilateral low back pain without sciatica, lumbar facet joint syndrome, osteoarthritis of sacroiliac joint, somatic dysfunction of sacroiliac joint. This patient's chief complaints consist of low back pain and bilateral leg pain/paresthesia R > L with weakness especially at R knee, and neck pain with B UE paresthesia leading to the following functional deficits: difficulty walking, unable to run, bending, lifting, moving furniture, driving, grocery shopping, housework, work Paramedic. Usual activities take extra time and the pain and dysfunction decreases her quality of life. Relevant past medical history and comorbidities include allergies (hives and knots; foods trigger), HTN, reflux, hx of gout, chronic pain syndrome, hx of R knee surgery, chronic neck pain with intermittent B UE paresthesia with hx of cervical spine surgery, episodes of feeling her whole body cannot move. Patient denies hx of cancer, stroke, seizures, lung problem, major cardiac events, diabetes, unexplained weight loss, changes in bowel or bladder problems, new onset stumbling. Does drop things. Chart review reveals Dr. Dossie Arbour provided facet blocks that resolved pt's pain 100% but was temporary. He planned to perform ablation that is indicated for longer term relief in the setting of successful facet blocks. However, insurance would not approve this without MRI first. Insurance requires PT prior to approval of MRI, leading to current referral for PT.    Limitations Sitting;Lifting;Standing;Walking;House hold activities;Other (comment)   work, lifting, bending, pushing, pulling   How long can you sit comfortably? can sit 2.5 hours (causes tolerable pain)    How long can you stand comfortably? 15-30 min before she must sit    How long can you walk comfortably? 1 hour    Diagnostic tests see chart    Patient Stated Goals "If PT can help with some of the pain, I  am for it"    Currently in Pain? Yes    Pain Score 7    Worst: 10+++/10; Best: 3/10   Pain Location Back    Pain Orientation Right;Left;Mid;Lower   currently across lower back   Pain Descriptors / Indicators Throbbing;Sharp;Nagging    Pain Type Chronic pain    Pain Radiating Towards pain and paresthesia down bilateral legs to feet intermittantly: lateral thigh to anterior lower leg. R > L.    Pain Onset More than a month ago    Pain Frequency Constant    Aggravating Factors  walking, doing the trash at work if bags are too heavy, moving desks, mopping, driving too long    Pain Relieving Factors laying in bed, medications (oxycodone and muscle relaxants, gabapentin), ice or heat    Effect of Pain on Daily Activities Functional Limitations: walking, unable to run, bending, lifting, moving furniture, driving,  grocery shopping, housework, work activities. Usual activities take extra time.             [facility] PT Assessment - 03/15/20 0001      Assessment   Medical Diagnosis chronic bilateral low back pain without sciatica, lumbar facet joint syndrome, osteoarthritis of sacroiliac joint, somatic dysfunction of sacroiliac joint    Referring Provider (PT) Milinda Pointer, MD    Onset Date/Surgical Date 03/15/19    Hand Dominance Left    Next MD Visit --   not yet set up   Prior Therapy None for this problem prior to current episode of care      Precautions   Precautions None      Restrictions   Weight Bearing Restrictions No      Balance Screen   Has the patient fallen in the past 6 months No    Has the patient had a decrease in activity level because of a fear of falling?  No    Is the patient reluctant to leave their home because of a fear of falling?  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  Slight shift in weight towards left side, correctable, appears related to R knee pain. Increased genuvarus at L knee and held in slight flexion.   Marland Kitchen Posture correction:  . Tremor: none . Muscle bulk: generally overall mildly decreased consistent with lack of regular adequate exercise or physical activity.  . Skin: The incision sites appear to be healing well with no excessive redness, warmth, drainage or signs of infection present.   . Bed mobility: rolling and supine <> sit WFL with report of pain . Transfers: sit <> stand WFL with report of pain . Gait: grossly WFL for household and short community ambulation with report of pain. More detailed gait analysis deferred to later date as needed.   NEUROLOGICAL Upper Motor Neuron Screen Hoffman's, and Clonus (ankle) negative bilaterally Dermatomes . L2-S2 appears equal and intact to light touch except R L5 decreased to light touch compared to left.  Myotomes . L2-S2 appears intact Deep Tendon Reflexes R/L  . 3+/3+ Biceps brachii reflex (C5, C6) . 0+/0+ Brachioradialis reflex (C6) - causes biceps reflex to go off bilaterally . 0+/0+ Triceps brachii reflex (C7) . 0+/0+ Quadriceps reflex (L4) . 2+/2+ Achilles reflex (S1) Neurodynamic Tests    Straight leg raise (SLR): R = negative sensitizing maneuver (tingling in R glute with no change), L = pain in low back but negative sensitizing manuever  SPINE MOTION. Lumbar AROM *Indicates pain - Flexion: = 2 inches proximal to ankles, end range pain. . - Extension: = 25%, pain during movement upon return. - Rotation: R = 75%, L = 75% (uncofmortable  bilaterally). - Side Flexion: R = proximal patella, L = proximal patella (pain during movenet and at end range bilaterally) . - Side glide: symmetrical and uncomfortable bilaterally.   PERIPHERAL JOINT  MOTION (in degrees) Passive Range of Motion (PROM) Comments: BLE appears WFL for basic mobility except:  B hip flexion 100 pulling and painful, stiff end feel. Left hip IR limited. Limited R knee extension.   MUSCLE PERFORMANCE (MMT):  *Indicates pain 03/14/20 Date Date  Joint/Motion R/L R/L R/L  Hip     Flexion 4+/5 / /  Extension (knee ext) 4+/4+ / /  Abduction 4+/4+* / /  Knee     Extension 4+*/5 / /  Flexion 3+*/5 / /  Ankle/Foot     Dorsiflexion  4/4+ / /  Great toe extension 5/5 / /  Eversion 4/4+ / /  Plantarflexion (seated) 4/4+ / /  Comments:   SPECIAL TESTS:  Straight leg raise (SLR): R = negative sensitizing maneuver (tingling in R glute with no change), L = pain in low back but negative sensitizing manuever  Scacral thrust: R = positive over SIJ, L = positive over SIJ.  FABER: R = pain in low back, L = pain low back and shot down front of thigh.  ACCESSORY MOTION:  - CPA tender at sacrum and lowest two segments of sacrum.  PALPATION: - TTP over B SIJ, R QL.   Objective measurements completed on examination: See above findings.     TREATMENT:  Therapeutic exercise: to centralize symptoms and improve ROM, strength, muscular endurance, and activity tolerance required for successful completion of functional activities.  - supine lower trunk rotation x 10 (discontinued due to pain at B SIJ).  - seated abdominal brace with marching, 1 second holds, 2x5 each side.  - Education on HEP including handout  - Education on diagnosis, prognosis, POC, anatomy and physiology of current condition.   HOME EXERCISE PROGRAM Access Code: [email or web address] URL: [email or web address] Date: 03/15/2020 Prepared by: Rosita Kea  Exercises Seated March - 1 x daily - 3 sets  - 5 reps - 1 second hold       PT Education - 03/15/20 1630    Education Details Exercise purpose/form. Self management techniques. Education on diagnosis, prognosis, POC, anatomy and physiology of current condition Education on HEP including handout    Person(s) Educated Patient    Methods Explanation;Demonstration;Tactile cues;Verbal cues;Handout    Comprehension Verbalized understanding;Returned demonstration;Verbal cues required;Tactile cues required;Need further instruction            PT Short Term Goals - 03/15/20 1759      PT SHORT TERM GOAL #1   Title Be independent with initial home exercise program for self-management of symptoms.    Baseline initial HEP provided at IE (03/14/2020);    Time 2    Period Weeks    Status New    Target Date 03/29/20             PT Long Term Goals - 03/15/20 1747      PT LONG TERM GOAL #1   Title Be independent with a long-term home exercise program for self-management of symptoms.    Baseline Initial HEP provided HEP (03/14/2020);    Time 6    Period Weeks    Status New   TARGET DATE FOR ALL LONG TERM GOALS: 04/26/2020     PT LONG TERM GOAL #2   Title Demonstrate improved FOTO score to equal or greater than 63 by visit 11 to demonstrate improvement in overall condition and self-reported functional ability.    Baseline 50 (03/14/2020);    Time 6    Period Weeks    Status New  PT LONG TERM GOAL #3   Title Patient will demonstrate at least 75% lumbar AROM with no compensations or increase in pain in all planes except intermittent end range discomfort to allow patient to complete valued activities with less difficulty.    Baseline pain during movement and restricted range (03/14/2020);    Time 6    Period Weeks    Status New      PT LONG TERM GOAL #4   Title Be able to demonstrate floor to waist lift with 25 box with proper form without limitation due to current condition in order to improve ability to lift during work  activities.    Baseline not tested (03/14/2020);    Time 6    Period Weeks    Status New      PT LONG TERM GOAL #5   Title Complete community, work and/or recreational activities without limitation due to current condition.    Baseline walking, unable to run, bending, lifting, moving furniture, driving, grocery shopping, housework, work activities. Usual activities take extra time (03/14/2020);    Time 6    Period Weeks    Status New                  Plan - 03/15/20 1632    Clinical Impression Statement Patient is a 49 y.o. female referred to outpatient physical therapy with a medical diagnosis of chronic bilateral low back pain without sciatica, lumbar facet joint syndrome, osteoarthritis of sacroiliac joint, somatic dysfunction of sacroiliac joint who presents with signs and symptoms consistent with chronic low back and bilateral sacroiliac pain with intermittant referral down B LE, R > L with R knee instability likely related to prior history of knee pain/dysfunction and exacerbated by pain referred down R leg. Patient with significant comorbidites including chronic neck pain with B UE intermittent paresthesia and hx of cervical spine surgery, chronic pain syndrome. Per clinical documentation and pt report she had excellent relief of back pain with facet blocks which are an indication for use of ablation procedure recommended by physician for longer term pain relief. Patient is referred by physician to PT due to insurance requirement to complete trial of PT prior to allowing MRI which insurance company requires for ablation procedure. PT is unable to provide same intervention as recommended ablation procedure and PT recommends physician's clinical judgement for pain relief be followed. Patient would likely have better results with PT when pain is better controlled with physician's recommended procedures. However, patient presents with significant pain, stiffness, paresthesia, muscle  performance (strength/endurance/power), activity tolerance, joint stiffness, muscle spasm, and ROM impairments that are limiting ability to complete her usual activities including walking, unable to run, bending, lifting, moving furniture, driving, grocery shopping, housework, work activities. Usual activities take extra time without difficulty. Patient may benefit from skilled physical therapy intervention to address current body structure impairments and activity limitations to improve function and work towards goals set in current POC in order to return to prior level of function or maximal functional improvement. Will continue with trial of PT with plan to refer back to physician if PT is not well tolerated or pt fails to make progress in reasonable amount of time.    Personal Factors and Comorbidities Age;Comorbidity 3+;Behavior Pattern;Past/Current Experience;Fitness;Social Background;Time since onset of injury/illness/exacerbation;Education    Comorbidities Relevant past medical history and comorbidities include allergies (hives and knots; foods trigger), HTN, reflux, hx of gout, chronic pain syndrome, hx of R knee surgery, chronic neck pain with intermittent B  UE paresthesia with hx of cervical spine surgery, episodes of feeling her whole body cannot move.    Examination-Activity Limitations Lift;Stand;Locomotion Level;Bend;Carry;Sit;Sleep;Squat;Stairs;Other    Examination-Participation Restrictions Yard Work;Cleaning;Meal Prep;Community Activity;Driving;Interpersonal Relationship;Laundry;Shop   walking, unable to run, bending, lifting, moving furniture, driving, grocery shopping, housework, work activities. Usual activities take extra time.   Stability/Clinical Decision Making Evolving/Moderate complexity    Clinical Decision Making Moderate    Rehab Potential Fair    PT Frequency 2x / week    PT Duration 6 weeks    PT Treatment/Interventions ADLs/Self Care Home Management;Aquatic  Therapy;Cryotherapy;Electrical Stimulation;Moist Heat;Traction;Gait training;Stair training;Functional mobility training;Therapeutic activities;Therapeutic exercise;Balance training;Neuromuscular re-education;Patient/family education;Manual techniques;Dry needling;Spinal Manipulations;Joint Manipulations    PT Next Visit Plan continue with graded exercise and manual as tolerated    PT Home Exercise Plan Medbridge Access Code: [email or web address]    Recommended Other Services proceed with procedures reccomended by physician    Consulted and Agree with Plan of Care Patient           Patient will benefit from skilled therapeutic intervention in order to improve the following deficits and impairments:  Increased fascial restricitons, Impaired sensation, Pain, Improper body mechanics, Decreased mobility, Increased muscle spasms, Impaired UE functional use, Impaired perceived functional ability, Hypomobility, Decreased strength, Decreased range of motion, Decreased endurance, Decreased activity tolerance, Difficulty walking, Impaired flexibility, Obesity  Visit Diagnosis: Chronic bilateral low back pain, unspecified whether sciatica present  Muscle weakness (generalized)     Problem List Patient Active Problem List   Diagnosis Date Noted  . History of illicit drug use 64/40/3474  . Pain medication agreement broken 03/07/2020  . Multiple food allergies 03/07/2020  . Excessive daytime sleepiness 03/06/2020  . Seasonal allergies 03/06/2020  . Marijuana use 03/06/2020  . Abnormal drug screen 03/06/2020  . Multiple environmental allergies 01/17/2020  . Other intervertebral disc degeneration, lumbar region 11/15/2019  . Prediabetes 10/10/2019  . HLD (hyperlipidemia) 10/10/2019  . Gastroesophageal reflux disease 09/06/2019  . Fibroid uterus 09/06/2019  . Rash 08/08/2019  . Spondylosis without myelopathy or radiculopathy, lumbosacral region 05/19/2019  . Lumbar facet syndrome (Bilateral) (R>L)  04/20/2019  . Spondylosis without myelopathy or radiculopathy, cervical region 03/22/2019  . Seasonal allergic rhinitis due to pollen 11/29/2018  . Seasonal asthma 11/29/2018  . Neurogenic pain 10/27/2018  . Chronic musculoskeletal pain 10/27/2018  . History of allergy to shellfish 09/28/2018  . Chronic upper extremity pain (Secondary Area of Pain) (Bilateral) (R>L) 09/27/2018  . Cervicalgia (Primary Area of Pain) (Bilateral) (L>R) 09/27/2018  . History of fusion of cervical spine (ACDF C4-C7) 09/27/2018  . Abnormal MRI, cervical spine (06/10/2018) 09/27/2018  . 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 Cervical facet joint syndrome (Bilateral) (L>R) 09/27/2018  . Cervical facet hypertrophy (C3-T1) 09/27/2018  . 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 Chronic sacroiliac joint dysfunction (Bilateral) 09/27/2018  . 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  . Somatic dysfunction of sacroiliac joint (Bilateral) 09/27/2018  . DDD (degenerative disc disease), cervical 09/27/2018  . Chronic neck pain (Bilateral) w/ history of cervical spinal surgery 09/27/2018  . Spondylosis, cervical, w/ myelopathy 09/27/2018  . Cervical spondylosis 09/27/2018  . DDD (degenerative disc disease), lumbar 09/27/2018  . Vitamin D deficiency 09/27/2018  . Elevated sed rate 09/08/2018  . Chronic neck pain (Primary Area of Pain) (Bilateral) (L>R) 09/07/2018  . Chronic low back pain [facility] Area of Pain) (Bilateral) (R>L) w/ sciatica (Bilateral) 09/07/2018  . Chronic lower extremity pain (Fourth Area of Pain) (  Bilateral) (R>L) 09/07/2018  . Chronic pain syndrome 09/07/2018  . Pharmacologic therapy 09/07/2018  . Disorder of skeletal system 09/07/2018  . Problems influencing health status 09/07/2018  . Chronic sacroiliac joint pain (Right) 09/07/2018  . Chronic low back pain [facility] Area of Pain) (Bilateral) (R>L) w/o sciatica  07/26/2018  . Essential hypertension 05/31/2018  . Chronic gout of foot (Left) 05/31/2018  . Numbness and tingling of both feet 05/31/2018  . Hyperkalemia 05/31/2018  . Cervical myelopathy ([place]) 07/01/2017    [name]. Graylon Good, PT, DPT 03/15/20, 6:03 PM  [facility] [address], [place], [address] Phone: [phone]   Fax:  [phone]  Name: Anita Gibbs MRN: [id number] Date of Birth: [birth year]

## 2020-03-15 ENCOUNTER — Ambulatory Visit: Payer: BC Managed Care – PPO | Admitting: Physical Therapy

## 2020-03-20 ENCOUNTER — Encounter: Payer: BC Managed Care – PPO | Admitting: Physical Therapy

## 2020-03-20 ENCOUNTER — Encounter: Payer: Self-pay | Admitting: Physical Therapy

## 2020-03-20 ENCOUNTER — Other Ambulatory Visit: Payer: Self-pay

## 2020-03-20 ENCOUNTER — Ambulatory Visit: Payer: BC Managed Care – PPO | Admitting: Physical Therapy

## 2020-03-20 DIAGNOSIS — G8929 Other chronic pain: Secondary | ICD-10-CM

## 2020-03-20 DIAGNOSIS — M545 Low back pain, unspecified: Secondary | ICD-10-CM

## 2020-03-20 DIAGNOSIS — M6281 Muscle weakness (generalized): Secondary | ICD-10-CM

## 2020-03-20 NOTE — Therapy (Signed)
[facility] [address] [place], [address] Phone: [phone]   Fax:  [phone]  Physical Therapy Treatment  Patient Details  Name: Anita Gibbs MRN: [id number] Date of Birth: [birth year] Referring Provider (PT): Milinda Pointer, MD   Encounter Date: 03/20/2020   PT End of Session - 03/20/20 1834    Visit Number 2    Number of Visits 12    Date for PT Re-Evaluation 04/26/20    Authorization Type [employer] reporting period from 03/14/2020    Progress Note Due on Visit 10    PT Start Time 1820    PT Stop Time 1900    PT Time Calculation (min) 40 min    Activity Tolerance Patient tolerated treatment well;Patient limited by pain    Behavior During Therapy [facility] for tasks assessed/performed           Past Medical History:  Diagnosis Date  . Arthritis   . Carpal boss, right   . Carpal tunnel syndrome, bilateral   . Headache   . Hypertension   . Seasonal asthma 11/29/2018    Past Surgical History:  Procedure Laterality Date  . ANTERIOR CERVICAL DECOMP/DISCECTOMY FUSION N/A 07/01/2017   Procedure: ANTERIOR CERVICAL DECOMPRESSION/DISCECTOMY FUSION 3 LEVELS;  Surgeon: Meade Maw, MD;  Location: [facility] ORS;  Service: Neurosurgery;  Laterality: N/A;  . KNEE ARTHROSCOPY Right     There were no vitals filed for this visit.   Subjective Assessment - 03/20/20 1821    Subjective Patient reports she has pain 8/10 pain and didn't go to work because she had a spell where she felt like she couldn't move. She thinks may have done too much at work on [employer]. She has been doing leg lifts from [facility] for aout 30 min a day. She has been doing it with her back supported. She states her doctor will not give her a pain pill refill because it shows she was positive for cannibis in her chart. She doesn't know how this could happen because she doesn't smoke. Patient reports lifting the trash at work intails  lifting the bag from waist up out of the can.    Pertinent History Patient is a 49 y.o. female who presents to outpatient physical therapy with a referral for medical diagnosis chronic bilateral low back pain without sciatica, lumbar facet joint syndrome, osteoarthritis of sacroiliac joint, somatic dysfunction of sacroiliac joint. This patient's chief complaints consist of low back pain and bilateral leg pain/paresthesia R > L with weakness especially at R knee, and neck pain with B UE paresthesia leading to the following functional deficits: difficulty walking, unable to run, bending, lifting, moving furniture, driving, grocery shopping, housework, work Paramedic. Usual activities take extra time and the pain and dysfunction decreases her quality of life. Relevant past medical history and comorbidities include allergies (hives and knots; foods trigger), HTN, reflux, hx of gout, chronic pain syndrome, hx of R knee surgery, chronic neck pain with intermittent B UE paresthesia with hx of cervical spine surgery, episodes of feeling her whole body cannot move. Patient denies hx of cancer, stroke, seizures, lung problem, major cardiac events, diabetes, unexplained weight loss, changes in bowel or bladder problems, new onset stumbling. Does drop things. Chart review reveals Dr. Dossie Arbour provided facet blocks that resolved pt's pain 100% but was temporary. He planned to perform ablation that is indicated for longer term relief in the setting of successful facet blocks. However, insurance would not  approve this without MRI first. Insurance requires PT prior to approval of MRI, leading to current referral for PT.    Limitations Sitting;Lifting;Standing;Walking;House hold activities;Other (comment)   work, lifting, bending, pushing, pulling   How long can you sit comfortably? can sit 2.5 hours (causes tolerable pain)    How long can you stand comfortably? 15-30 min before she must sit    How long can you walk  comfortably? 1 hour    Diagnostic tests see chart    Patient Stated Goals "If PT can help with some of the pain, I am for it"    Currently in Pain? Yes    Pain Score 8     Pain Location Back    Pain Orientation --   lower back   Pain Radiating Towards numbness in bilateral feet    Pain Onset More than a month ago             TREATMENT:  Therapeutic exercise:to centralize symptoms and improve ROM, strength, muscular endurance, and activity tolerance required for successful completion of functional activities.   - seated abdominal brace with marching, back supported, 1 second holds, x2 min. Moved to back unsupported for 1 min. More challenging.  -  Squats with BUE support focusing on glute activation and proper form with hip hinging, stabilized back, and tibial perpendicular to floor with knees behind toes. Self-selected depth. 3x10 (no pain) - Standing pallof press (multifidus press) with green theraband 3x10 each side. Cuing for trunk stability.  - side stepping with yellow band around knees, left x 20 feet (discontinued at that point due to R knee pain) - seated (back unsupported) B scaption with 3# DBs to improve trunk extensor strength. 3x10.  - seated lumbar flexion roll out with clear theraball x 30  - standing unilateral row with green theraband, 3x10 each side.  - standing isometric abdominal brace with UE pressure into theraball positioned in front of patient on chair, 5 second holds, x 10 forwards and x 10 each oblique.   HOME EXERCISE PROGRAM Access Code: [email or web address] URL: [email or web address] Date: 03/15/2020 Prepared by: Rosita Kea  Exercises Seated March - 1 x daily - 3 sets - 5 reps - 1 second hold     PT Education - 03/20/20 1833    Education Details Exercise purpose/form. Self management techniques.    Person(s) Educated Patient    Methods Explanation;Demonstration;Tactile cues;Verbal cues;Handout    Comprehension Verbalized  understanding;Returned demonstration;Verbal cues required;Need further instruction;Tactile cues required            PT Short Term Goals - 03/15/20 1759      PT SHORT TERM GOAL #1   Title Be independent with initial home exercise program for self-management of symptoms.    Baseline initial HEP provided at IE (03/14/2020);    Time 2    Period Weeks    Status New    Target Date 03/29/20             PT Long Term Goals - 03/15/20 1747      PT LONG TERM GOAL #1   Title Be independent with a long-term home exercise program for self-management of symptoms.    Baseline Initial HEP provided HEP (03/14/2020);    Time 6    Period Weeks    Status New   TARGET DATE FOR ALL LONG TERM GOALS: 04/26/2020     PT LONG TERM GOAL #2   Title Demonstrate improved FOTO score to equal or greater than 63  by visit 11 to demonstrate improvement in overall condition and self-reported functional ability.    Baseline 50 (03/14/2020);    Time 6    Period Weeks    Status New      PT LONG TERM GOAL #3   Title Patient will demonstrate at least 75% lumbar AROM with no compensations or increase in pain in all planes except intermittent end range discomfort to allow patient to complete valued activities with less difficulty.    Baseline pain during movement and restricted range (03/14/2020);    Time 6    Period Weeks    Status New      PT LONG TERM GOAL #4   Title Be able to demonstrate floor to waist lift with 25 box with proper form without limitation due to current condition in order to improve ability to lift during work activities.    Baseline not tested (03/14/2020);    Time 6    Period Weeks    Status New      PT LONG TERM GOAL #5   Title Complete community, work and/or recreational activities without limitation due to current condition.    Baseline walking, unable to run, bending, lifting, moving furniture, driving, grocery shopping, housework, work activities. Usual activities take extra time  (03/14/2020);    Time 6    Period Weeks    Status New                 Plan - 03/20/20 1853    Clinical Impression Statement Patient tolerated treatment fair with no increase in pain but continued to have difficulty with 8/10 pain that she had upon arrival. Discontinued or modified exercise as needed to be tolerable. Focus on graded exercise for trunk and LE loading. Patient did have some spasming at R lower back after last exercise and experienced some of the tremors she gets periodically in her B hands during that exercise. Patient would benefit from continued management of limiting condition by skilled physical therapist to address remaining impairments and functional limitations to work towards stated goals and return to PLOF or maximal functional independence.    Personal Factors and Comorbidities Age;Comorbidity 3+;Behavior Pattern;Past/Current Experience;Fitness;Social Background;Time since onset of injury/illness/exacerbation;Education    Comorbidities Relevant past medical history and comorbidities include allergies (hives and knots; foods trigger), HTN, reflux, hx of gout, chronic pain syndrome, hx of R knee surgery, chronic neck pain with intermittent B UE paresthesia with hx of cervical spine surgery, episodes of feeling her whole body cannot move.    Examination-Activity Limitations Lift;Stand;Locomotion Level;Bend;Carry;Sit;Sleep;Squat;Stairs;Other    Examination-Participation Restrictions Yard Work;Cleaning;Meal Prep;Community Activity;Driving;Interpersonal Relationship;Laundry;Shop   walking, unable to run, bending, lifting, moving furniture, driving, grocery shopping, housework, work activities. Usual activities take extra time.   Stability/Clinical Decision Making Evolving/Moderate complexity    Rehab Potential Fair    PT Frequency 2x / week    PT Duration 6 weeks    PT Treatment/Interventions ADLs/Self Care Home Management;Aquatic Therapy;Cryotherapy;Electrical  Stimulation;Moist Heat;Traction;Gait training;Stair training;Functional mobility training;Therapeutic activities;Therapeutic exercise;Balance training;Neuromuscular re-education;Patient/family education;Manual techniques;Dry needling;Spinal Manipulations;Joint Manipulations    PT Next Visit Plan continue with graded exercise and manual as tolerated    PT Home Exercise Plan Medbridge Access Code: [email or web address]    Consulted and Agree with Plan of Care Patient           Patient will benefit from skilled therapeutic intervention in order to improve the following deficits and impairments:  Increased fascial restricitons, Impaired sensation, Pain, Improper body mechanics, Decreased mobility, Increased muscle  spasms, Impaired UE functional use, Impaired perceived functional ability, Hypomobility, Decreased strength, Decreased range of motion, Decreased endurance, Decreased activity tolerance, Difficulty walking, Impaired flexibility, Obesity  Visit Diagnosis: Chronic bilateral low back pain, unspecified whether sciatica present  Muscle weakness (generalized)     Problem List Patient Active Problem List   Diagnosis Date Noted  . History of illicit drug use 79/10/4095  . Pain medication agreement broken 03/07/2020  . Multiple food allergies 03/07/2020  . Excessive daytime sleepiness 03/06/2020  . Seasonal allergies 03/06/2020  . Marijuana use 03/06/2020  . Abnormal drug screen 03/06/2020  . Multiple environmental allergies 01/17/2020  . Other intervertebral disc degeneration, lumbar region 11/15/2019  . Prediabetes 10/10/2019  . HLD (hyperlipidemia) 10/10/2019  . Gastroesophageal reflux disease 09/06/2019  . Fibroid uterus 09/06/2019  . Rash 08/08/2019  . Spondylosis without myelopathy or radiculopathy, lumbosacral region 05/19/2019  . Lumbar facet syndrome (Bilateral) (R>L) 04/20/2019  . Spondylosis without myelopathy or radiculopathy, cervical region 03/22/2019  . Seasonal allergic  rhinitis due to pollen 11/29/2018  . Seasonal asthma 11/29/2018  . Neurogenic pain 10/27/2018  . Chronic musculoskeletal pain 10/27/2018  . History of allergy to shellfish 09/28/2018  . Chronic upper extremity pain (Secondary Area of Pain) (Bilateral) (R>L) 09/27/2018  . Cervicalgia (Primary Area of Pain) (Bilateral) (L>R) 09/27/2018  . History of fusion of cervical spine (ACDF C4-C7) 09/27/2018  . Abnormal MRI, cervical spine (06/10/2018) 09/27/2018  . 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 Cervical facet joint syndrome (Bilateral) (L>R) 09/27/2018  . Cervical facet hypertrophy (C3-T1) 09/27/2018  . 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 Chronic sacroiliac joint dysfunction (Bilateral) 09/27/2018  . 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  . Somatic dysfunction of sacroiliac joint (Bilateral) 09/27/2018  . DDD (degenerative disc disease), cervical 09/27/2018  . Chronic neck pain (Bilateral) w/ history of cervical spinal surgery 09/27/2018  . Spondylosis, cervical, w/ myelopathy 09/27/2018  . Cervical spondylosis 09/27/2018  . DDD (degenerative disc disease), lumbar 09/27/2018  . Vitamin D deficiency 09/27/2018  . Elevated sed rate 09/08/2018  . Chronic neck pain (Primary Area of Pain) (Bilateral) (L>R) 09/07/2018  . Chronic low back pain [facility] Area of Pain) (Bilateral) (R>L) w/ sciatica (Bilateral) 09/07/2018  . Chronic lower extremity pain (Fourth Area of Pain) (Bilateral) (R>L) 09/07/2018  . Chronic pain syndrome 09/07/2018  . Pharmacologic therapy 09/07/2018  . Disorder of skeletal system 09/07/2018  . Problems influencing health status 09/07/2018  . Chronic sacroiliac joint pain (Right) 09/07/2018  . Chronic low back pain [facility] Area of Pain) (Bilateral) (R>L) w/o sciatica 07/26/2018  . Essential hypertension 05/31/2018  . Chronic gout of foot (Left) 05/31/2018  . Numbness and tingling of  both feet 05/31/2018  . Hyperkalemia 05/31/2018  . Cervical myelopathy ([place]) 07/01/2017    [name]. Graylon Good, PT, DPT 03/20/20, 7:11 PM  [facility] [address] [place], [address] Phone: [phone]   Fax:  [phone]  Name: Anita Gibbs MRN: [id number] Date of Birth: 09-03-1970

## 2020-03-22 ENCOUNTER — Encounter: Payer: Self-pay | Admitting: Physical Therapy

## 2020-03-22 ENCOUNTER — Other Ambulatory Visit: Payer: Self-pay

## 2020-03-22 ENCOUNTER — Encounter: Payer: BC Managed Care – PPO | Admitting: Physical Therapy

## 2020-03-22 ENCOUNTER — Encounter: Payer: Self-pay | Admitting: Internal Medicine

## 2020-03-22 ENCOUNTER — Ambulatory Visit: Payer: BC Managed Care – PPO | Admitting: Physical Therapy

## 2020-03-22 DIAGNOSIS — M6281 Muscle weakness (generalized): Secondary | ICD-10-CM

## 2020-03-22 DIAGNOSIS — M545 Low back pain, unspecified: Secondary | ICD-10-CM

## 2020-03-22 DIAGNOSIS — G8929 Other chronic pain: Secondary | ICD-10-CM

## 2020-03-22 NOTE — Therapy (Signed)
[facility] [address], [place], [address] Phone: [phone]   Fax:  [phone]  Physical Therapy Treatment  Patient Details  Name: Anita Gibbs MRN: [id number] Date of Birth: [birth year] Referring Provider (PT): Milinda Pointer, MD   Encounter Date: 03/22/2020   PT End of Session - 03/22/20 1830    Visit Number 3    Number of Visits 12    Date for PT Re-Evaluation 04/26/20    Authorization Type [employer] reporting period from 03/14/2020    Progress Note Due on Visit 10    PT Start Time 1816    PT Stop Time 1855    PT Time Calculation (min) 39 min    Activity Tolerance Patient tolerated treatment well;Patient limited by pain    Behavior During Therapy [facility] for tasks assessed/performed           Past Medical History:  Diagnosis Date  . Arthritis   . Carpal boss, right   . Carpal tunnel syndrome, bilateral   . Headache   . Hypertension   . Seasonal asthma 11/29/2018    Past Surgical History:  Procedure Laterality Date  . ANTERIOR CERVICAL DECOMP/DISCECTOMY FUSION N/A 07/01/2017   Procedure: ANTERIOR CERVICAL DECOMPRESSION/DISCECTOMY FUSION 3 LEVELS;  Surgeon: Meade Maw, MD;  Location: [facility] ORS;  Service: Neurosurgery;  Laterality: N/A;  . KNEE ARTHROSCOPY Right     There were no vitals filed for this visit.   Subjective Assessment - 03/22/20 1822    Subjective Patient reports her pain got much worse after leaving physical therapy last time. She states it hit her all of a suddend and she could not get out of the tub without help. 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 She feels like something else besides PT to help her pain. She reports she thinks she found the source of the [facility] that caused a positive test. State her freind brought her gummie bears that she ate not knowing they had THC in them. She found it in the ingrendients since last  session. Rates her pain as 12/10 across the lower back. Also has tingling in both feet. R knee has been hurting too but she does not feel it is that related to the back pain. 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    Pertinent History Patient is a 49 y.o. female who presents to outpatient physical therapy with a referral for medical diagnosis chronic bilateral low back pain without sciatica, lumbar facet joint syndrome, osteoarthritis of sacroiliac joint, somatic dysfunction of sacroiliac joint. This patient's chief complaints consist of low back pain and bilateral leg pain/paresthesia R > L with weakness especially at R knee, and neck pain with B UE paresthesia leading to the following functional deficits: difficulty walking, unable to run, bending, lifting, moving furniture, driving, grocery shopping, housework, work Paramedic. Usual activities take extra time and the pain and dysfunction decreases her quality of life. Relevant past medical history and comorbidities include allergies (hives and knots; foods trigger), HTN, reflux, hx of gout, chronic pain syndrome, hx of R knee surgery, chronic neck pain with intermittent B UE paresthesia with hx of cervical spine surgery, episodes of feeling her whole body cannot move. Patient denies hx of cancer, stroke, seizures, lung problem, major cardiac events, diabetes, unexplained weight loss, changes in bowel or bladder problems, new onset stumbling. Does drop things. Chart review reveals Dr.  Naveira provided facet blocks that resolved pt's pain 100% but was temporary. He planned to perform ablation that is indicated for longer term relief in the setting of successful facet blocks. However, insurance would not approve this without MRI first. Insurance requires PT prior to approval of MRI, leading to current referral for PT.    Limitations Sitting;Lifting;Standing;Walking;House hold activities;Other (comment)   work, lifting,  bending, pushing, pulling   How long can you sit comfortably? can sit 2.5 hours (causes tolerable pain)    How long can you stand comfortably? 15-30 min before she must sit    How long can you walk comfortably? 1 hour    Diagnostic tests see chart    Patient Stated Goals "If PT can help with some of the pain, I am for it"    Currently in Pain? Yes    Pain Score 10-Worst pain ever   12/10   Pain Location Back    Pain Orientation Lower    Pain Descriptors / Indicators Stabbing;Tightness    Pain Type Chronic pain    Pain Radiating Towards numbness in bilateral feet    Pain Onset More than a month ago              TREATMENT: Therapeutic exercise:to centralize symptoms and improve ROM, strength, muscular endurance, and activity tolerance required for successful completion of functional activities. - NuStep level 1 using bilateral upper and lower extremities. Seat setting 8. For improved extremity mobility, muscular endurance, and activity tolerance; and to induce the analgesic effect of aerobic exercise, stimulate improved joint nutrition, and prepare body structures and systems for following interventions. x 10  minutes. SPM = 35 - seated lumbar flexion roll out with clear theraball x x 4 min forward and diagonals.  - seated abdominal brace with marching, back  unsupported, 1 second holds, x1 min. Patient self selected this exercise.  - education about HEP and strategies for staying active without increasing pain.   Manual therapy: to reduce pain and tissue tension, improve range of motion, neuromodulation, in order to promote improved ability to complete functional activities. Prone with face in cradle, one pillow underhips and under lower legs for comfort:  - light to moderate STM over lumbar and lower thoracic spine musculature, and bilateral glutes. Skin to skin contact over lumbar/thoracic spine and upper glutes. "the stick" assist light STM over lower thoracic, lumbar spine,  glutes, and posterior lateral thights and calves (pt reported this felt good). For pain reduction.   HOME EXERCISE PROGRAM Access Code: [email or web address] URL: [email or web address] Date: 03/15/2020 Prepared by: Rosita Kea  Exercises Seated March - 1 x daily - 3 sets - 5 reps - 1 second hold    PT Education - 03/22/20 1830    Education Details intervention purpose/form. Self management techniques.    Person(s) Educated Patient    Methods Explanation;Demonstration;Tactile cues;Verbal cues    Comprehension Verbalized understanding;Returned demonstration;Verbal cues required;Need further instruction            PT Short Term Goals - 03/15/20 1759      PT SHORT TERM GOAL #1   Title Be independent with initial home exercise program for self-management of symptoms.    Baseline initial HEP provided at IE (03/14/2020);    Time 2    Period Weeks    Status New    Target Date 03/29/20             PT Long Term Goals - 03/15/20 1747      PT  LONG TERM GOAL #1   Title Be independent with a long-term home exercise program for self-management of symptoms.    Baseline Initial HEP provided HEP (03/14/2020);    Time 6    Period Weeks    Status New   TARGET DATE FOR ALL LONG TERM GOALS: 04/26/2020     PT LONG TERM GOAL #2   Title Demonstrate improved FOTO score to equal or greater than 63 by visit 11 to demonstrate improvement in overall condition and self-reported functional ability.    Baseline 50 (03/14/2020);    Time 6    Period Weeks    Status New      PT LONG TERM GOAL #3   Title Patient will demonstrate at least 75% lumbar AROM with no compensations or increase in pain in all planes except intermittent end range discomfort to allow patient to complete valued activities with less difficulty.    Baseline pain during movement and restricted range (03/14/2020);    Time 6    Period Weeks    Status New      PT LONG TERM GOAL #4   Title Be able to demonstrate floor to waist  lift with 25 box with proper form without limitation due to current condition in order to improve ability to lift during work activities.    Baseline not tested (03/14/2020);    Time 6    Period Weeks    Status New      PT LONG TERM GOAL #5   Title Complete community, work and/or recreational activities without limitation due to current condition.    Baseline walking, unable to run, bending, lifting, moving furniture, driving, grocery shopping, housework, work activities. Usual activities take extra time (03/14/2020);    Time 6    Period Weeks    Status New                 Plan - 03/22/20 1911    Clinical Impression Statement Patient tolerated this session well overall with interventions focused on pain control due to report of high level of pain apparently due to lack of tolerance for last PT session over the subsequent 24 hours following the visit. Patient reports she previously had a massage where the massage therapist hit a tender spot near the SIJs that immediately flared her pain, so this region was avoided during manual and patient was asked to give immediate feedback if PT got too close to such tender regions. One instance of starting to get too close at the R side and PT removed hand immediately with pt reporting that pain subsided. Patient TTP throughout lumbar and B glute musculature, so mild to shallow STM techniques used. Able to tolerate today's exercise without increased pain. Patient would benefit from continued management of limiting condition by skilled physical therapist to address remaining impairments and functional limitations to work towards stated goals and return to PLOF or maximal functional independence.    Personal Factors and Comorbidities Age;Comorbidity 3+;Behavior Pattern;Past/Current Experience;Fitness;Social Background;Time since onset of injury/illness/exacerbation;Education    Comorbidities Relevant past medical history and comorbidities include allergies  (hives and knots; foods trigger), HTN, reflux, hx of gout, chronic pain syndrome, hx of R knee surgery, chronic neck pain with intermittent B UE paresthesia with hx of cervical spine surgery, episodes of feeling her whole body cannot move.    Examination-Activity Limitations Lift;Stand;Locomotion Level;Bend;Carry;Sit;Sleep;Squat;Stairs;Other    Examination-Participation Restrictions Yard Work;Cleaning;Meal Prep;Community Activity;Driving;Interpersonal Relationship;Laundry;Shop   walking, unable to run, bending, lifting, moving furniture, driving, grocery shopping,  housework, work activities. Usual activities take extra time.   Stability/Clinical Decision Making Evolving/Moderate complexity    Rehab Potential Fair    PT Frequency 2x / week    PT Duration 6 weeks    PT Treatment/Interventions ADLs/Self Care Home Management;Aquatic Therapy;Cryotherapy;Electrical Stimulation;Moist Heat;Traction;Gait training;Stair training;Functional mobility training;Therapeutic activities;Therapeutic exercise;Balance training;Neuromuscular re-education;Patient/family education;Manual techniques;Dry needling;Spinal Manipulations;Joint Manipulations    PT Next Visit Plan continue with graded exercise and manual as tolerated    PT Home Exercise Plan Medbridge Access Code: [email or web address]    Consulted and Agree with Plan of Care Patient           Patient will benefit from skilled therapeutic intervention in order to improve the following deficits and impairments:  Increased fascial restricitons, Impaired sensation, Pain, Improper body mechanics, Decreased mobility, Increased muscle spasms, Impaired UE functional use, Impaired perceived functional ability, Hypomobility, Decreased strength, Decreased range of motion, Decreased endurance, Decreased activity tolerance, Difficulty walking, Impaired flexibility, Obesity  Visit Diagnosis: Chronic bilateral low back pain, unspecified whether sciatica present  Muscle weakness  (generalized)     Problem List Patient Active Problem List   Diagnosis Date Noted  . History of illicit drug use 34/28/7681  . Pain medication agreement broken 03/07/2020  . Multiple food allergies 03/07/2020  . Excessive daytime sleepiness 03/06/2020  . Seasonal allergies 03/06/2020  . Marijuana use 03/06/2020  . Abnormal drug screen 03/06/2020  . Multiple environmental allergies 01/17/2020  . Other intervertebral disc degeneration, lumbar region 11/15/2019  . Prediabetes 10/10/2019  . HLD (hyperlipidemia) 10/10/2019  . Gastroesophageal reflux disease 09/06/2019  . Fibroid uterus 09/06/2019  . Rash 08/08/2019  . Spondylosis without myelopathy or radiculopathy, lumbosacral region 05/19/2019  . Lumbar facet syndrome (Bilateral) (R>L) 04/20/2019  . Spondylosis without myelopathy or radiculopathy, cervical region 03/22/2019  . Seasonal allergic rhinitis due to pollen 11/29/2018  . Seasonal asthma 11/29/2018  . Neurogenic pain 10/27/2018  . Chronic musculoskeletal pain 10/27/2018  . History of allergy to shellfish 09/28/2018  . Chronic upper extremity pain (Secondary Area of Pain) (Bilateral) (R>L) 09/27/2018  . Cervicalgia (Primary Area of Pain) (Bilateral) (L>R) 09/27/2018  . History of fusion of cervical spine (ACDF C4-C7) 09/27/2018  . Abnormal MRI, cervical spine (06/10/2018) 09/27/2018  . 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 Cervical facet joint syndrome (Bilateral) (L>R) 09/27/2018  . Cervical facet hypertrophy (C3-T1) 09/27/2018  . 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 Chronic sacroiliac joint dysfunction (Bilateral) 09/27/2018  . 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  . Somatic dysfunction of sacroiliac joint (Bilateral) 09/27/2018  . DDD (degenerative disc disease), cervical 09/27/2018  . Chronic neck pain (Bilateral) w/ history of cervical spinal surgery 09/27/2018  . Spondylosis,  cervical, w/ myelopathy 09/27/2018  . Cervical spondylosis 09/27/2018  . DDD (degenerative disc disease), lumbar 09/27/2018  . Vitamin D deficiency 09/27/2018  . Elevated sed rate 09/08/2018  . Chronic neck pain (Primary Area of Pain) (Bilateral) (L>R) 09/07/2018  . Chronic low back pain [facility] Area of Pain) (Bilateral) (R>L) w/ sciatica (Bilateral) 09/07/2018  . Chronic lower extremity pain (Fourth Area of Pain) (Bilateral) (R>L) 09/07/2018  . Chronic pain syndrome 09/07/2018  . Pharmacologic therapy 09/07/2018  . Disorder of skeletal system 09/07/2018  . Problems influencing health status 09/07/2018  . Chronic sacroiliac joint pain (Right) 09/07/2018  . Chronic low back pain [facility] Area of Pain) (Bilateral) (R>L) w/o sciatica 07/26/2018  . Essential hypertension 05/31/2018  . Chronic gout of foot (Left) 05/31/2018  .  Numbness and tingling of both feet 05/31/2018  . Hyperkalemia 05/31/2018  . Cervical myelopathy ([place]) 07/01/2017    [name]. Graylon Good, PT, DPT 03/22/20, 7:12 PM  [facility] [address] [place], [address] Phone: [phone]   Fax:  [phone]  Name: ANABELEN KAMINSKY MRN: [id number] Date of Birth: 01/21/1971

## 2020-03-23 ENCOUNTER — Ambulatory Visit: Payer: BC Managed Care – PPO

## 2020-03-23 DIAGNOSIS — G4719 Other hypersomnia: Secondary | ICD-10-CM

## 2020-03-23 DIAGNOSIS — G4733 Obstructive sleep apnea (adult) (pediatric): Secondary | ICD-10-CM | POA: Diagnosis not present

## 2020-03-27 ENCOUNTER — Ambulatory Visit: Payer: BC Managed Care – PPO | Admitting: Physical Therapy

## 2020-03-27 ENCOUNTER — Encounter: Payer: BC Managed Care – PPO | Admitting: Physical Therapy

## 2020-03-29 ENCOUNTER — Ambulatory Visit: Payer: BC Managed Care – PPO | Admitting: Physical Therapy

## 2020-03-29 ENCOUNTER — Encounter: Payer: BC Managed Care – PPO | Admitting: Physical Therapy

## 2020-03-29 ENCOUNTER — Other Ambulatory Visit: Payer: Self-pay

## 2020-03-29 DIAGNOSIS — M6281 Muscle weakness (generalized): Secondary | ICD-10-CM

## 2020-03-29 DIAGNOSIS — G8929 Other chronic pain: Secondary | ICD-10-CM

## 2020-03-29 DIAGNOSIS — M545 Low back pain, unspecified: Secondary | ICD-10-CM

## 2020-03-29 NOTE — Therapy (Signed)
[facility] [address] [place], [address] Phone: [phone]   Fax:  [phone]  Physical Therapy Treatment  Patient Details  Name: Anita Gibbs MRN: [id number] Date of Birth: [birth year] Referring Provider (PT): Milinda Pointer, MD   Encounter Date: 03/29/2020   PT End of Session - 03/29/20 1832    Visit Number 4    Number of Visits 12    Date for PT Re-Evaluation 04/26/20    Authorization Type [employer] reporting period from 03/14/2020    Progress Note Due on Visit 10    PT Start Time 1816    PT Stop Time 1855    PT Time Calculation (min) 39 min    Activity Tolerance Patient tolerated treatment well;Patient limited by pain    Behavior During Therapy [facility] for tasks assessed/performed           Past Medical History:  Diagnosis Date  . Arthritis   . Carpal boss, right   . Carpal tunnel syndrome, bilateral   . Headache   . Hypertension   . Seasonal asthma 11/29/2018    Past Surgical History:  Procedure Laterality Date  . ANTERIOR CERVICAL DECOMP/DISCECTOMY FUSION N/A 07/01/2017   Procedure: ANTERIOR CERVICAL DECOMPRESSION/DISCECTOMY FUSION 3 LEVELS;  Surgeon: Meade Maw, MD;  Location: [facility] ORS;  Service: Neurosurgery;  Laterality: N/A;  . KNEE ARTHROSCOPY Right     There were no vitals filed for this visit.   Subjective Assessment - 03/29/20 1816    Subjective Patient reports she has 5/10 pain in her back and 10/10 pain in the right knee. She reports she felt better folloiwng last treatment session. She missed her PT appointment on tuesday due to her eye swelling up and needing to go to ED and it has resolved. She states her R knee has been giving her problems all this week. She worked today but sat the whole time. She has also been having some trouble with panic attacks.    Pertinent History Patient is a 49 y.o. female who presents to outpatient physical therapy with a  referral for medical diagnosis chronic bilateral low back pain without sciatica, lumbar facet joint syndrome, osteoarthritis of sacroiliac joint, somatic dysfunction of sacroiliac joint. This patient's chief complaints consist of low back pain and bilateral leg pain/paresthesia R > L with weakness especially at R knee, and neck pain with B UE paresthesia leading to the following functional deficits: difficulty walking, unable to run, bending, lifting, moving furniture, driving, grocery shopping, housework, work Paramedic. Usual activities take extra time and the pain and dysfunction decreases her quality of life. Relevant past medical history and comorbidities include allergies (hives and knots; foods trigger), HTN, reflux, hx of gout, chronic pain syndrome, hx of R knee surgery, chronic neck pain with intermittent B UE paresthesia with hx of cervical spine surgery, episodes of feeling her whole body cannot move. Patient denies hx of cancer, stroke, seizures, lung problem, major cardiac events, diabetes, unexplained weight loss, changes in bowel or bladder problems, new onset stumbling. Does drop things. Chart review reveals Dr. Dossie Arbour provided facet blocks that resolved pt's pain 100% but was temporary. He planned to perform ablation that is indicated for longer term relief in the setting of successful facet blocks. However, insurance would not approve this without MRI first. Insurance requires PT prior to approval of MRI, leading to current referral for PT.    Limitations Sitting;Lifting;Standing;Walking;House hold activities;Other (comment)   work, lifting,  bending, pushing, pulling   How long can you sit comfortably? can sit 2.5 hours (causes tolerable pain)    How long can you stand comfortably? 15-30 min before she must sit    How long can you walk comfortably? 1 hour    Diagnostic tests see chart    Patient Stated Goals "If PT can help with some of the pain, I am for it"    Currently in Pain?  Yes    Pain Score 10-Worst pain ever    Pain Location --   R knee 10/10; back 5/10.   Pain Onset More than a month ago            TREATMENT: Therapeutic exercise:to centralize symptoms and improve ROM, strength, muscular endurance, and activity tolerance required for successful completion of functional activities. - NuStep level 1 using bilateral upper and lower extremities. Seat setting 8. For improved extremity mobility, muscular endurance, and activity tolerance; and to induce the analgesic effect of aerobic exercise, stimulate improved joint nutrition, and prepare body structures and systems for following interventions. x 10  minutes. Average SPM = 35 - seated lumbar flexion roll out with clear theraball  x 4 min forward and diagonals.  - seated abdominal brace with marching,back unsupported,1 second holds,x1 min. Patient reports increased "tight" feeling   Manual therapy: to reduce pain and tissue tension, improve range of motion, neuromodulation, in order to promote improved ability to complete functional activities. Seated in short sitting:  - R tibeofemoral joint distraction and lateral rotation of tibia joint mobilization grade II-IV. Improved pain and stability when walking following.   Prone with face in cradle, one pillow under hips and under lower legs for comfort:  - light to moderate STM over lumbar and lower thoracic spine musculature, and bilateral glutes. Skin to skin contact over lumbar/thoracic spine and upper glutes. "the stick" assist light STM over lower thoracic, lumbar spine, glutes, and posterior lateral thights and calves (pt reported this felt good). For pain reduction.   HOME EXERCISE PROGRAM Access Code: [email or web address] URL: [email or web address] Date: 03/15/2020 Prepared by: Anita Gibbs  Exercises Seated March - 1 x daily - 3 sets - 5 reps - 1 second hold     PT Education - 03/29/20 1832    Education Details intervention purpose/form.  Self management techniques    Person(s) Educated Patient    Methods Explanation;Demonstration;Tactile cues;Verbal cues    Comprehension Verbalized understanding;Returned demonstration;Verbal cues required;Tactile cues required;Need further instruction            PT Short Term Goals - 03/29/20 1919      PT SHORT TERM GOAL #1   Title Be independent with initial home exercise program for self-management of symptoms.    Baseline initial HEP provided at IE (03/14/2020);    Time 2    Period Weeks    Status Partially Met    Target Date 03/29/20             PT Long Term Goals - 03/15/20 1747      PT LONG TERM GOAL #1   Title Be independent with a long-term home exercise program for self-management of symptoms.    Baseline Initial HEP provided HEP (03/14/2020);    Time 6    Period Weeks    Status New   TARGET DATE FOR ALL LONG TERM GOALS: 04/26/2020     PT LONG TERM GOAL #2   Title Demonstrate improved FOTO score to equal or greater than 63 by visit 11  to demonstrate improvement in overall condition and self-reported functional ability.    Baseline 50 (03/14/2020);    Time 6    Period Weeks    Status New      PT LONG TERM GOAL #3   Title Patient will demonstrate at least 75% lumbar AROM with no compensations or increase in pain in all planes except intermittent end range discomfort to allow patient to complete valued activities with less difficulty.    Baseline pain during movement and restricted range (03/14/2020);    Time 6    Period Weeks    Status New      PT LONG TERM GOAL #4   Title Be able to demonstrate floor to waist lift with 25 box with proper form without limitation due to current condition in order to improve ability to lift during work activities.    Baseline not tested (03/14/2020);    Time 6    Period Weeks    Status New      PT LONG TERM GOAL #5   Title Complete community, work and/or recreational activities without limitation due to current condition.     Baseline walking, unable to run, bending, lifting, moving furniture, driving, grocery shopping, housework, work activities. Usual activities take extra time (03/14/2020);    Time 6    Period Weeks    Status New                 Plan - 03/29/20 1918    Clinical Impression Statement Patient tolerated treatment with difficulty due to pain and discomfort. Performed similar intervention to last session due to report of relief until the next morning when she went to work. Patient reports some relief from manual therapy in the back but also that back pain still 5/10 pain. Reports improvement in R knee pain from 10/10 to 7/10.  Patient would benefit from continued management of limiting condition by skilled physical therapist to address remaining impairments and functional limitations to work towards stated goals and return to PLOF or maximal functional independence.    Personal Factors and Comorbidities Age;Comorbidity 3+;Behavior Pattern;Past/Current Experience;Fitness;Social Background;Time since onset of injury/illness/exacerbation;Education    Comorbidities Relevant past medical history and comorbidities include allergies (hives and knots; foods trigger), HTN, reflux, hx of gout, chronic pain syndrome, hx of R knee surgery, chronic neck pain with intermittent B UE paresthesia with hx of cervical spine surgery, episodes of feeling her whole body cannot move.    Examination-Activity Limitations Lift;Stand;Locomotion Level;Bend;Carry;Sit;Sleep;Squat;Stairs;Other    Examination-Participation Restrictions Yard Work;Cleaning;Meal Prep;Community Activity;Driving;Interpersonal Relationship;Laundry;Shop   walking, unable to run, bending, lifting, moving furniture, driving, grocery shopping, housework, work activities. Usual activities take extra time.   Stability/Clinical Decision Making Evolving/Moderate complexity    Rehab Potential Fair    PT Frequency 2x / week    PT Duration 6 weeks    PT  Treatment/Interventions ADLs/Self Care Home Management;Aquatic Therapy;Cryotherapy;Electrical Stimulation;Moist Heat;Traction;Gait training;Stair training;Functional mobility training;Therapeutic activities;Therapeutic exercise;Balance training;Neuromuscular re-education;Patient/family education;Manual techniques;Dry needling;Spinal Manipulations;Joint Manipulations    PT Next Visit Plan continue with graded exercise and manual as tolerated    PT Home Exercise Plan Medbridge Access Code: [email or web address]    Consulted and Agree with Plan of Care Patient           Patient will benefit from skilled therapeutic intervention in order to improve the following deficits and impairments:  Increased fascial restricitons, Impaired sensation, Pain, Improper body mechanics, Decreased mobility, Increased muscle spasms, Impaired UE functional use, Impaired perceived functional ability, Hypomobility, Decreased strength,  Decreased range of motion, Decreased endurance, Decreased activity tolerance, Difficulty walking, Impaired flexibility, Obesity  Visit Diagnosis: Chronic bilateral low back pain, unspecified whether sciatica present  Muscle weakness (generalized)     Problem List Patient Active Problem List   Diagnosis Date Noted  . History of illicit drug use 18/98/4210  . Pain medication agreement broken 03/07/2020  . Multiple food allergies 03/07/2020  . Excessive daytime sleepiness 03/06/2020  . Seasonal allergies 03/06/2020  . Marijuana use 03/06/2020  . Abnormal drug screen 03/06/2020  . Multiple environmental allergies 01/17/2020  . Other intervertebral disc degeneration, lumbar region 11/15/2019  . Prediabetes 10/10/2019  . HLD (hyperlipidemia) 10/10/2019  . Gastroesophageal reflux disease 09/06/2019  . Fibroid uterus 09/06/2019  . Rash 08/08/2019  . Spondylosis without myelopathy or radiculopathy, lumbosacral region 05/19/2019  . Lumbar facet syndrome (Bilateral) (R>L) 04/20/2019  . Spondylosis  without myelopathy or radiculopathy, cervical region 03/22/2019  . Seasonal allergic rhinitis due to pollen 11/29/2018  . Seasonal asthma 11/29/2018  . Neurogenic pain 10/27/2018  . Chronic musculoskeletal pain 10/27/2018  . History of allergy to shellfish 09/28/2018  . Chronic upper extremity pain (Secondary Area of Pain) (Bilateral) (R>L) 09/27/2018  . Cervicalgia (Primary Area of Pain) (Bilateral) (L>R) 09/27/2018  . History of fusion of cervical spine (ACDF C4-C7) 09/27/2018  . Abnormal MRI, cervical spine (06/10/2018) 09/27/2018  . 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 Cervical facet joint syndrome (Bilateral) (L>R) 09/27/2018  . Cervical facet hypertrophy (C3-T1) 09/27/2018  . 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 Chronic sacroiliac joint dysfunction (Bilateral) 09/27/2018  . 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  . Somatic dysfunction of sacroiliac joint (Bilateral) 09/27/2018  . DDD (degenerative disc disease), cervical 09/27/2018  . Chronic neck pain (Bilateral) w/ history of cervical spinal surgery 09/27/2018  . Spondylosis, cervical, w/ myelopathy 09/27/2018  . Cervical spondylosis 09/27/2018  . DDD (degenerative disc disease), lumbar 09/27/2018  . Vitamin D deficiency 09/27/2018  . Elevated sed rate 09/08/2018  . Chronic neck pain (Primary Area of Pain) (Bilateral) (L>R) 09/07/2018  . Chronic low back pain [facility] Area of Pain) (Bilateral) (R>L) w/ sciatica (Bilateral) 09/07/2018  . Chronic lower extremity pain (Fourth Area of Pain) (Bilateral) (R>L) 09/07/2018  . Chronic pain syndrome 09/07/2018  . Pharmacologic therapy 09/07/2018  . Disorder of skeletal system 09/07/2018  . Problems influencing health status 09/07/2018  . Chronic sacroiliac joint pain (Right) 09/07/2018  . Chronic low back pain [facility] Area of Pain) (Bilateral) (R>L) w/o sciatica 07/26/2018  . Essential  hypertension 05/31/2018  . Chronic gout of foot (Left) 05/31/2018  . Numbness and tingling of both feet 05/31/2018  . Hyperkalemia 05/31/2018  . Cervical myelopathy ([place]) 07/01/2017    [name]. Graylon Good, PT, DPT 03/29/20, 7:20 PM  [place] [facility] [address] [place], [address] Phone: [phone]   Fax:  [phone]  Name: TRENITA HULME MRN: [id number] Date of Birth: 09/16/1970

## 2020-03-31 DIAGNOSIS — G4733 Obstructive sleep apnea (adult) (pediatric): Secondary | ICD-10-CM | POA: Diagnosis not present

## 2020-04-02 ENCOUNTER — Telehealth: Payer: Self-pay | Admitting: Adult Health

## 2020-04-02 NOTE — Telephone Encounter (Signed)
Lm for pt

## 2020-04-02 NOTE — Telephone Encounter (Signed)
Called and spoke to pt, who is requesting HST results.  Pt is aware that study has not been read by MD as of yet. Advised pt that our office will contact her once sleep study has been read.

## 2020-04-03 ENCOUNTER — Encounter: Payer: BC Managed Care – PPO | Admitting: Physical Therapy

## 2020-04-03 ENCOUNTER — Ambulatory Visit: Payer: BC Managed Care – PPO | Admitting: Physical Therapy

## 2020-04-04 ENCOUNTER — Telehealth: Payer: Self-pay | Admitting: Adult Health

## 2020-04-04 DIAGNOSIS — G4733 Obstructive sleep apnea (adult) (pediatric): Secondary | ICD-10-CM

## 2020-04-04 NOTE — Telephone Encounter (Signed)
Spoke with pt. She is aware of her results. Order has been placed for CPAP. Pt will call back to make follow up appointment. Nothing further was needed.

## 2020-04-04 NOTE — Telephone Encounter (Signed)
Home sleep study from 03/23/20 showed mild obstructive sleep apnea with an AHI of 7.3 and SpO2 low of 77%.    Please let patient know that sleep study shows mild obstructive sleep apnea and nocturnal hypoxemia.  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  Enroll in [place]. Please set up a follow-up visit in the office in 6 to 8 weeks with a CPAP download.  If she would like an office visit to review results and treatment options we can do that as well

## 2020-04-05 ENCOUNTER — Encounter: Payer: Self-pay | Admitting: Physical Therapy

## 2020-04-05 ENCOUNTER — Encounter: Payer: BC Managed Care – PPO | Admitting: Physical Therapy

## 2020-04-05 ENCOUNTER — Ambulatory Visit: Payer: BC Managed Care – PPO | Attending: Pain Medicine | Admitting: Physical Therapy

## 2020-04-05 ENCOUNTER — Other Ambulatory Visit: Payer: Self-pay

## 2020-04-05 DIAGNOSIS — M545 Low back pain, unspecified: Secondary | ICD-10-CM

## 2020-04-05 DIAGNOSIS — M6281 Muscle weakness (generalized): Secondary | ICD-10-CM | POA: Diagnosis present

## 2020-04-05 DIAGNOSIS — G8929 Other chronic pain: Secondary | ICD-10-CM

## 2020-04-05 NOTE — Therapy (Signed)
[facility] [address], [place], [address] Phone: [phone]   Fax:  [phone]  Physical Therapy Treatment  Patient Details  Name: Anita Gibbs MRN: [id number] Date of Birth: May 17, 1971 Referring Provider (PT): Milinda Pointer, MD   Encounter Date: 04/05/2020   PT End of Session - 04/05/20 1831    Visit Number 5    Number of Visits 12    Date for PT Re-Evaluation 04/26/20    Authorization Type [employer] reporting period from 03/14/2020    Progress Note Due on Visit 10    PT Start Time 1820    PT Stop Time 1900    PT Time Calculation (min) 40 min    Activity Tolerance Patient tolerated treatment well;Patient limited by pain    Behavior During Therapy [facility] for tasks assessed/performed           Past Medical History:  Diagnosis Date  . Arthritis   . Carpal boss, right   . Carpal tunnel syndrome, bilateral   . Headache   . Hypertension   . Seasonal asthma 11/29/2018    Past Surgical History:  Procedure Laterality Date  . ANTERIOR CERVICAL DECOMP/DISCECTOMY FUSION N/A 07/01/2017   Procedure: ANTERIOR CERVICAL DECOMPRESSION/DISCECTOMY FUSION 3 LEVELS;  Surgeon: Meade Maw, MD;  Location: [facility] ORS;  Service: Neurosurgery;  Laterality: N/A;  . KNEE ARTHROSCOPY Right     There were no vitals filed for this visit.   Subjective Assessment - 04/05/20 1823    Subjective Patient reports her pain is 8/10 in her low back and R knee. Her R knee has been really bothering her a lot. She felt fine following last treatment session with a little "twigging" in her back but she feels that is normal. Knee pain is in the anterior portion. 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 She missed her PT appointment this Tuesday after she moved a couple of desks at work on Monday. 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 She has been getting shooting pain down the anterior R thigh towards the medial knee.    Pertinent History Patient is a 49 y.o. female who presents to outpatient physical therapy with a referral for medical diagnosis chronic bilateral low back pain without sciatica, lumbar facet joint syndrome, osteoarthritis of sacroiliac joint, somatic dysfunction of sacroiliac joint. This patient's chief complaints consist of low back pain and bilateral leg pain/paresthesia R > L with weakness especially at R knee, and neck pain with B UE paresthesia leading to the following functional deficits: difficulty walking, unable to run, bending, lifting, moving furniture, driving, grocery shopping, housework, work Paramedic. Usual activities take extra time and the pain and dysfunction decreases her quality of life. Relevant past medical history and comorbidities include allergies (hives and knots; foods trigger), HTN, reflux, hx of gout, chronic pain syndrome, hx of R knee surgery, chronic neck pain with intermittent B UE paresthesia with hx of cervical spine surgery, episodes of feeling her whole body cannot move. Patient denies hx of cancer, stroke, seizures, lung problem, major cardiac events, diabetes, unexplained weight loss, changes in bowel or bladder problems, new onset stumbling. Does drop things. Chart review reveals Dr. Dossie Arbour provided facet blocks that resolved pt's pain 100% but was temporary. He  planned to perform ablation that is indicated for longer term relief in the setting of successful facet blocks. However, insurance would not approve this without MRI first. Insurance requires PT prior to approval of MRI, leading to current referral for PT.    Limitations Sitting;Lifting;Standing;Walking;House hold activities;Other (comment)   work, lifting, bending, pushing, pulling   How long can you sit comfortably? can sit 2.5 hours  (causes tolerable pain)    How long can you stand comfortably? 15-30 min before she must sit    How long can you walk comfortably? 1 hour    Diagnostic tests see chart    Patient Stated Goals "If PT can help with some of the pain, I am for it"    Currently in Pain? Yes    Pain Score 8     Pain Location Back   and anterior R knee   Pain Onset More than a month ago            TREATMENT:  Therapeutic exercise:to centralize symptoms and improve ROM, strength, muscular endurance, and activity tolerance required for successful completion of functional activities. -NuStep level1using bilateral upper and lower extremities. Seat setting 8. For improved extremity mobility, muscular endurance, and activity tolerance; and to induce the analgesic effect of aerobic exercise, stimulate improved joint nutrition, and prepare body structures and systems for following interventions. x31mnutes. Average SPM = 49 - seated lumbar flexion roll out with clear theraball x 4 min forward and diagonals.  seated on elevated plinth with abdominal brace  - marching,backunsupported,1 second holds,2x10 each side.  - isometric hip adduction against small green ball, 2x10, 4 second holds - isometric hip abduction against strap, 2x10, 4 seconds    Manual therapy:to reduce pain and tissue tension, improve range of motion, neuromodulation, in order to promote improved ability to complete functional activities. Seated in short sitting:  - R tibeofemoral joint distraction and lateral rotation of tibia joint mobilization grade II-IV. No improvement.    HOME EXERCISE PROGRAM Access Code: [email or web address]: [email or web address] Date: 03/15/2020 Prepared by: SRosita Kea Exercises Seated March - 1 x daily - 3 sets - 5 reps - 1 second hold     PT Education - 04/05/20 1831    Education Details intervention purpose/form. Self management techniques    Person(s) Educated Patient    Methods  Explanation;Demonstration;Tactile cues;Verbal cues    Comprehension Verbalized understanding;Returned demonstration;Verbal cues required;Tactile cues required;Need further instruction            PT Short Term Goals - 03/29/20 1919      PT SHORT TERM GOAL #1   Title Be independent with initial home exercise program for self-management of symptoms.    Baseline initial HEP provided at IE (03/14/2020);    Time 2    Period Weeks    Status Partially Met    Target Date 03/29/20             PT Long Term Goals - 03/15/20 1747      PT LONG TERM GOAL #1   Title Be independent with a long-term home exercise program for self-management of symptoms.    Baseline Initial HEP provided HEP (03/14/2020);    Time 6    Period Weeks    Status New   TARGET DATE FOR ALL LONG TERM GOALS: 04/26/2020     PT LONG TERM GOAL #2   Title Demonstrate improved FOTO score to equal or greater than 63 by visit 11 to demonstrate improvement in overall condition  and self-reported functional ability.    Baseline 50 (03/14/2020);    Time 6    Period Weeks    Status New      PT LONG TERM GOAL #3   Title Patient will demonstrate at least 75% lumbar AROM with no compensations or increase in pain in all planes except intermittent end range discomfort to allow patient to complete valued activities with less difficulty.    Baseline pain during movement and restricted range (03/14/2020);    Time 6    Period Weeks    Status New      PT LONG TERM GOAL #4   Title Be able to demonstrate floor to waist lift with 25 box with proper form without limitation due to current condition in order to improve ability to lift during work activities.    Baseline not tested (03/14/2020);    Time 6    Period Weeks    Status New      PT LONG TERM GOAL #5   Title Complete community, work and/or recreational activities without limitation due to current condition.    Baseline walking, unable to run, bending, lifting, moving furniture,  driving, grocery shopping, housework, work activities. Usual activities take extra time (03/14/2020);    Time 6    Period Weeks    Status New                 Plan - 04/05/20 1843    Clinical Impression Statement Patient tolerated session with difficulty due to pain in the R knee and low back. Continued with focus on pain control this session. Patient declined manual therapy for back and reported no improvement in R knee with manual therapy to that region. Patient with strong analgesic gait favoring R LE whenever ambulating. Patient would benefit from continued management of limiting condition by skilled physical therapist to address remaining impairments and functional limitations to work towards stated goals and return to PLOF or maximal functional independence.    Personal Factors and Comorbidities Age;Comorbidity 3+;Behavior Pattern;Past/Current Experience;Fitness;Social Background;Time since onset of injury/illness/exacerbation;Education    Comorbidities Relevant past medical history and comorbidities include allergies (hives and knots; foods trigger), HTN, reflux, hx of gout, chronic pain syndrome, hx of R knee surgery, chronic neck pain with intermittent B UE paresthesia with hx of cervical spine surgery, episodes of feeling her whole body cannot move.    Examination-Activity Limitations Lift;Stand;Locomotion Level;Bend;Carry;Sit;Sleep;Squat;Stairs;Other    Examination-Participation Restrictions Yard Work;Cleaning;Meal Prep;Community Activity;Driving;Interpersonal Relationship;Laundry;Shop   walking, unable to run, bending, lifting, moving furniture, driving, grocery shopping, housework, work activities. Usual activities take extra time.   Stability/Clinical Decision Making Evolving/Moderate complexity    Rehab Potential Fair    PT Frequency 2x / week    PT Duration 6 weeks    PT Treatment/Interventions ADLs/Self Care Home Management;Aquatic Therapy;Cryotherapy;Electrical  Stimulation;Moist Heat;Traction;Gait training;Stair training;Functional mobility training;Therapeutic activities;Therapeutic exercise;Balance training;Neuromuscular re-education;Patient/family education;Manual techniques;Dry needling;Spinal Manipulations;Joint Manipulations    PT Next Visit Plan continue with graded exercise and manual as tolerated    PT Home Exercise Plan Medbridge Access Code: [email or web address]    Consulted and Agree with Plan of Care Patient           Patient will benefit from skilled therapeutic intervention in order to improve the following deficits and impairments:  Increased fascial restricitons, Impaired sensation, Pain, Improper body mechanics, Decreased mobility, Increased muscle spasms, Impaired UE functional use, Impaired perceived functional ability, Hypomobility, Decreased strength, Decreased range of motion, Decreased endurance, Decreased activity tolerance, Difficulty walking, Impaired flexibility,  Obesity  Visit Diagnosis: Chronic bilateral low back pain, unspecified whether sciatica present  Muscle weakness (generalized)     Problem List Patient Active Problem List   Diagnosis Date Noted  . History of illicit drug use 28/00/3491  . Pain medication agreement broken 03/07/2020  . Multiple food allergies 03/07/2020  . Excessive daytime sleepiness 03/06/2020  . Seasonal allergies 03/06/2020  . Marijuana use 03/06/2020  . Abnormal drug screen 03/06/2020  . Multiple environmental allergies 01/17/2020  . Other intervertebral disc degeneration, lumbar region 11/15/2019  . Prediabetes 10/10/2019  . HLD (hyperlipidemia) 10/10/2019  . Gastroesophageal reflux disease 09/06/2019  . Fibroid uterus 09/06/2019  . Rash 08/08/2019  . Spondylosis without myelopathy or radiculopathy, lumbosacral region 05/19/2019  . Lumbar facet syndrome (Bilateral) (R>L) 04/20/2019  . Spondylosis without myelopathy or radiculopathy, cervical region 03/22/2019  . Seasonal allergic  rhinitis due to pollen 11/29/2018  . Seasonal asthma 11/29/2018  . Neurogenic pain 10/27/2018  . Chronic musculoskeletal pain 10/27/2018  . History of allergy to shellfish 09/28/2018  . Chronic upper extremity pain (Secondary Area of Pain) (Bilateral) (R>L) 09/27/2018  . Cervicalgia (Primary Area of Pain) (Bilateral) (L>R) 09/27/2018  . History of fusion of cervical spine (ACDF C4-C7) 09/27/2018  . Abnormal MRI, cervical spine (06/10/2018) 09/27/2018  . 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 Cervical facet joint syndrome (Bilateral) (L>R) 09/27/2018  . Cervical facet hypertrophy (C3-T1) 09/27/2018  . 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 Chronic sacroiliac joint dysfunction (Bilateral) 09/27/2018  . 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  . Somatic dysfunction of sacroiliac joint (Bilateral) 09/27/2018  . DDD (degenerative disc disease), cervical 09/27/2018  . Chronic neck pain (Bilateral) w/ history of cervical spinal surgery 09/27/2018  . Spondylosis, cervical, w/ myelopathy 09/27/2018  . Cervical spondylosis 09/27/2018  . DDD (degenerative disc disease), lumbar 09/27/2018  . Vitamin D deficiency 09/27/2018  . Elevated sed rate 09/08/2018  . Chronic neck pain (Primary Area of Pain) (Bilateral) (L>R) 09/07/2018  . Chronic low back pain [facility] Area of Pain) (Bilateral) (R>L) w/ sciatica (Bilateral) 09/07/2018  . Chronic lower extremity pain (Fourth Area of Pain) (Bilateral) (R>L) 09/07/2018  . Chronic pain syndrome 09/07/2018  . Pharmacologic therapy 09/07/2018  . Disorder of skeletal system 09/07/2018  . Problems influencing health status 09/07/2018  . Chronic sacroiliac joint pain (Right) 09/07/2018  . Chronic low back pain [facility] Area of Pain) (Bilateral) (R>L) w/o sciatica 07/26/2018  . Essential hypertension 05/31/2018  . Chronic gout of foot (Left) 05/31/2018  . Numbness and tingling of  both feet 05/31/2018  . Hyperkalemia 05/31/2018  . Cervical myelopathy ([place]) 07/01/2017    [name]. Graylon Good, PT, DPT 04/05/20, 7:20 PM  [facility] [address] [place], [address] Phone: [phone]   Fax:  [phone]  Name: Anita Gibbs MRN: [id number] Date of Birth: Feb 18, 1971

## 2020-04-09 ENCOUNTER — Encounter: Payer: Self-pay | Admitting: Physical Therapy

## 2020-04-09 ENCOUNTER — Other Ambulatory Visit: Payer: Self-pay

## 2020-04-09 ENCOUNTER — Ambulatory Visit: Payer: BC Managed Care – PPO | Admitting: Physical Therapy

## 2020-04-09 DIAGNOSIS — G8929 Other chronic pain: Secondary | ICD-10-CM

## 2020-04-09 DIAGNOSIS — M545 Low back pain, unspecified: Secondary | ICD-10-CM

## 2020-04-09 DIAGNOSIS — M6281 Muscle weakness (generalized): Secondary | ICD-10-CM

## 2020-04-09 NOTE — Telephone Encounter (Signed)
Please see 04/04/2020 phone note.

## 2020-04-10 ENCOUNTER — Encounter: Payer: BC Managed Care – PPO | Admitting: Physical Therapy

## 2020-04-10 NOTE — Therapy (Signed)
[facility] [address] [place], [place], [address] Phone: [phone]   Fax:  [phone]  Physical Therapy Treatment  Patient Details  Name: Anita Gibbs MRN: [id number] Date of Birth: 09/23/1970 Referring Provider (PT): Milinda Pointer, MD   Encounter Date: 04/09/2020   PT End of Session - 04/10/20 1553    Visit Number 6    Number of Visits 12    Date for PT Re-Evaluation 04/26/20    Authorization Type [employer] reporting period from 03/14/2020    Authorization - Number of Visits 6    Progress Note Due on Visit 10    PT Start Time 0818    PT Stop Time 0949    PT Time Calculation (min) 91 min    Activity Tolerance Patient tolerated treatment well;Patient limited by pain    Behavior During Therapy [facility] for tasks assessed/performed           Past Medical History:  Diagnosis Date  . Arthritis   . Carpal boss, right   . Carpal tunnel syndrome, bilateral   . Headache   . Hypertension   . Seasonal asthma 11/29/2018    Past Surgical History:  Procedure Laterality Date  . ANTERIOR CERVICAL DECOMP/DISCECTOMY FUSION N/A 07/01/2017   Procedure: ANTERIOR CERVICAL DECOMPRESSION/DISCECTOMY FUSION 3 LEVELS;  Surgeon: Meade Maw, MD;  Location: [facility] ORS;  Service: Neurosurgery;  Laterality: N/A;  . KNEE ARTHROSCOPY Right     There were no vitals filed for this visit.   Subjective Assessment - 04/09/20 0825    Subjective Pt. arrived to PT in [place] this morning for FCE.  Pt. reports 7/10 low back pain with R radicular symptoms.  Pt. states R knee pain has been present for years and reports 7/10 with standing/walking tasks.  Pt. is returning to Dr. Dossie Arbour for injection and is waiting on RF oblation/ MRI authorization.    Pertinent History Patient is a 49 y.o. female who presents to outpatient physical therapy with a referral for medical diagnosis chronic bilateral low back pain without sciatica, lumbar facet  joint syndrome, osteoarthritis of sacroiliac joint, somatic dysfunction of sacroiliac joint. This patient's chief complaints consist of low back pain and bilateral leg pain/paresthesia R > L with weakness especially at R knee, and neck pain with B UE paresthesia leading to the following functional deficits: difficulty walking, unable to run, bending, lifting, moving furniture, driving, grocery shopping, housework, work Paramedic. Usual activities take extra time and the pain and dysfunction decreases her quality of life. Relevant past medical history and comorbidities include allergies (hives and knots; foods trigger), HTN, reflux, hx of gout, chronic pain syndrome, hx of R knee surgery, chronic neck pain with intermittent B UE paresthesia with hx of cervical spine surgery, episodes of feeling her whole body cannot move. Patient denies hx of cancer, stroke, seizures, lung problem, major cardiac events, diabetes, unexplained weight loss, changes in bowel or bladder problems, new onset stumbling. Does drop things. Chart review reveals Dr. Dossie Arbour provided facet blocks that resolved pt's pain 100% but was temporary. He planned to perform ablation that is indicated for longer term relief in the setting of successful facet blocks. However, insurance would not approve this without MRI first. Insurance requires PT prior to approval of MRI, leading to current referral for PT.    Limitations Sitting;Lifting;Standing;Walking;House hold activities;Other (comment)   work, lifting, bending, pushing, pulling   How long can you sit comfortably? can sit 2.5 hours (causes tolerable  pain)    How long can you stand comfortably? 15-30 min before she must sit    How long can you walk comfortably? 1 hour    Diagnostic tests see chart    Patient Stated Goals "If PT can help with some of the pain, I am for it"    Currently in Pain? Yes    Pain Score 7     Pain Location Back    Pain Orientation Lower    Pain Onset More  than a month ago                     PT Short Term Goals - 03/29/20 1919      PT SHORT TERM GOAL #1   Title Be independent with initial home exercise program for self-management of symptoms.    Baseline initial HEP provided at IE (03/14/2020);    Time 2    Period Weeks    Status Partially Met    Target Date 03/29/20             PT Long Term Goals - 03/15/20 1747      PT LONG TERM GOAL #1   Title Be independent with a long-term home exercise program for self-management of symptoms.    Baseline Initial HEP provided HEP (03/14/2020);    Time 6    Period Weeks    Status New   TARGET DATE FOR ALL LONG TERM GOALS: 04/26/2020     PT LONG TERM GOAL #2   Title Demonstrate improved FOTO score to equal or greater than 63 by visit 11 to demonstrate improvement in overall condition and self-reported functional ability.    Baseline 50 (03/14/2020);    Time 6    Period Weeks    Status New      PT LONG TERM GOAL #3   Title Patient will demonstrate at least 75% lumbar AROM with no compensations or increase in pain in all planes except intermittent end range discomfort to allow patient to complete valued activities with less difficulty.    Baseline pain during movement and restricted range (03/14/2020);    Time 6    Period Weeks    Status New      PT LONG TERM GOAL #4   Title Be able to demonstrate floor to waist lift with 25 box with proper form without limitation due to current condition in order to improve ability to lift during work activities.    Baseline not tested (03/14/2020);    Time 6    Period Weeks    Status New      PT LONG TERM GOAL #5   Title Complete community, work and/or recreational activities without limitation due to current condition.    Baseline walking, unable to run, bending, lifting, moving furniture, driving, grocery shopping, housework, work activities. Usual activities take extra time (03/14/2020);    Time 6    Period Weeks    Status New                Plan - 04/10/20 1554    Clinical Impression Statement Overall Level of Work: Falls within the Light range.  Exerting up to 20 pounds of force occasionally, and/or up to 10 pounds of force frequently, and/or a negligible amount of force constantly (Constantly: activity or condition exist 2/3 or more of the time) to move objects.  Physical demand requirements are in excess of those for Sedentary Work.  Even though the weight lifted may be  only a negligible amount, a job should be rated Light Work: (1) when it requires walking or standing to significant degree; or (2) when it requires sitting most of the time but entails pushing and/or pulling of arm or leg controls; and/or (3) when the job requires working at a production rate pace entailing the constant pushing and/or pulling of materials even though the weight of those materials is negligible.  NOTE: The constant stress and strain of maintaining a production rate pace, especially in an industrial setting, can be and is physically demanding of a worker even though the amount of force exerted is negligible.  Please note that the dynamic strength/manual materials handling section of the report indicates a higher level of work than that determined by considering the client's performance on the entire test.  Our research shows that the safe, overall level of work is significantly influenced by non-materials handling (i.e. position tolerance and mobility) abilities.  To ignore these non-materials handling demands, negatively impacts the validity of the test.    Please see the Task Performance Table for specific abilities.  Tolerance for the 8-Hour Day: Due to the client's limited ability to walk and stand, she would have to alternate between standing, walking and other tasks as listed in the task performance table to be able to tolerate the Light level of work for the 8-hour day/40-hour week.    Personal Factors and Comorbidities Age;Comorbidity 3+;Behavior  Pattern;Past/Current Experience;Fitness;Social Background;Time since onset of injury/illness/exacerbation;Education    Comorbidities Relevant past medical history and comorbidities include allergies (hives and knots; foods trigger), HTN, reflux, hx of gout, chronic pain syndrome, hx of R knee surgery, chronic neck pain with intermittent B UE paresthesia with hx of cervical spine surgery, episodes of feeling her whole body cannot move.    Examination-Activity Limitations Lift;Stand;Locomotion Level;Bend;Carry;Sit;Sleep;Squat;Stairs;Other    Examination-Participation Restrictions Yard Work;Cleaning;Meal Prep;Community Activity;Driving;Interpersonal Relationship;Laundry;Shop   walking, unable to run, bending, lifting, moving furniture, driving, grocery shopping, housework, work activities. Usual activities take extra time.   Stability/Clinical Decision Making Evolving/Moderate complexity    Clinical Decision Making Moderate    Rehab Potential Fair    PT Frequency 2x / week    PT Duration 6 weeks    PT Treatment/Interventions ADLs/Self Care Home Management;Aquatic Therapy;Cryotherapy;Electrical Stimulation;Moist Heat;Traction;Gait training;Stair training;Functional mobility training;Therapeutic activities;Therapeutic exercise;Balance training;Neuromuscular re-education;Patient/family education;Manual techniques;Dry needling;Spinal Manipulations;Joint Manipulations    PT Next Visit Plan continue with graded exercise and manual as tolerated    PT Home Exercise Plan Medbridge Access Code: [email or web address]    Recommended Other Services FCE report sent to Dr. Adalberto Cole office    Consulted and Agree with Plan of Care Patient           Patient will benefit from skilled therapeutic intervention in order to improve the following deficits and impairments:  Increased fascial restricitons, Impaired sensation, Pain, Improper body mechanics, Decreased mobility, Increased muscle spasms, Impaired UE functional use, Impaired  perceived functional ability, Hypomobility, Decreased strength, Decreased range of motion, Decreased endurance, Decreased activity tolerance, Difficulty walking, Impaired flexibility, Obesity  Visit Diagnosis: Chronic bilateral low back pain, unspecified whether sciatica present  Muscle weakness (generalized)     Problem List Patient Active Problem List   Diagnosis Date Noted  . History of illicit drug use 22/10/5425  . Pain medication agreement broken 03/07/2020  . Multiple food allergies 03/07/2020  . Excessive daytime sleepiness 03/06/2020  . Seasonal allergies 03/06/2020  . Marijuana use 03/06/2020  . Abnormal drug screen 03/06/2020  . Multiple environmental allergies 01/17/2020  .  Other intervertebral disc degeneration, lumbar region 11/15/2019  . Prediabetes 10/10/2019  . HLD (hyperlipidemia) 10/10/2019  . Gastroesophageal reflux disease 09/06/2019  . Fibroid uterus 09/06/2019  . Rash 08/08/2019  . Spondylosis without myelopathy or radiculopathy, lumbosacral region 05/19/2019  . Lumbar facet syndrome (Bilateral) (R>L) 04/20/2019  . Spondylosis without myelopathy or radiculopathy, cervical region 03/22/2019  . Seasonal allergic rhinitis due to pollen 11/29/2018  . Seasonal asthma 11/29/2018  . Neurogenic pain 10/27/2018  . Chronic musculoskeletal pain 10/27/2018  . History of allergy to shellfish 09/28/2018  . Chronic upper extremity pain (Secondary Area of Pain) (Bilateral) (R>L) 09/27/2018  . Cervicalgia (Primary Area of Pain) (Bilateral) (L>R) 09/27/2018  . History of fusion of cervical spine (ACDF C4-C7) 09/27/2018  . Abnormal MRI, cervical spine (06/10/2018) 09/27/2018  . 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 Cervical facet joint syndrome (Bilateral) (L>R) 09/27/2018  . Cervical facet hypertrophy (C3-T1) 09/27/2018  . 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 Chronic sacroiliac joint dysfunction  (Bilateral) 09/27/2018  . 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  . Somatic dysfunction of sacroiliac joint (Bilateral) 09/27/2018  . DDD (degenerative disc disease), cervical 09/27/2018  . Chronic neck pain (Bilateral) w/ history of cervical spinal surgery 09/27/2018  . Spondylosis, cervical, w/ myelopathy 09/27/2018  . Cervical spondylosis 09/27/2018  . DDD (degenerative disc disease), lumbar 09/27/2018  . Vitamin D deficiency 09/27/2018  . Elevated sed rate 09/08/2018  . Chronic neck pain (Primary Area of Pain) (Bilateral) (L>R) 09/07/2018  . Chronic low back pain [facility] Area of Pain) (Bilateral) (R>L) w/ sciatica (Bilateral) 09/07/2018  . Chronic lower extremity pain (Fourth Area of Pain) (Bilateral) (R>L) 09/07/2018  . Chronic pain syndrome 09/07/2018  . Pharmacologic therapy 09/07/2018  . Disorder of skeletal system 09/07/2018  . Problems influencing health status 09/07/2018  . Chronic sacroiliac joint pain (Right) 09/07/2018  . Chronic low back pain [facility] Area of Pain) (Bilateral) (R>L) w/o sciatica 07/26/2018  . Essential hypertension 05/31/2018  . Chronic gout of foot (Left) 05/31/2018  . Numbness and tingling of both feet 05/31/2018  . Hyperkalemia 05/31/2018  . Cervical myelopathy ([place]) 07/01/2017   Pura Spice, PT, DPT # [phone] 04/10/2020, 3:56 PM  [facility] [address] [place], [place], [address] Phone: [phone]   Fax:  [phone]  Name: Anita Gibbs MRN: [id number] Date of Birth: [birth year]

## 2020-04-11 ENCOUNTER — Other Ambulatory Visit: Payer: Self-pay

## 2020-04-11 ENCOUNTER — Ambulatory Visit: Payer: BC Managed Care – PPO | Admitting: Physical Therapy

## 2020-04-11 ENCOUNTER — Telehealth: Payer: Self-pay

## 2020-04-11 ENCOUNTER — Encounter: Payer: Self-pay | Admitting: Physical Therapy

## 2020-04-11 DIAGNOSIS — G8929 Other chronic pain: Secondary | ICD-10-CM

## 2020-04-11 DIAGNOSIS — M545 Low back pain, unspecified: Secondary | ICD-10-CM

## 2020-04-11 DIAGNOSIS — M6281 Muscle weakness (generalized): Secondary | ICD-10-CM

## 2020-04-11 NOTE — Telephone Encounter (Signed)
We have been waiting on PT and MRI to be completed to meet requirements of [employer] to get authorization for the RFA.  She has done the PT but there was no MRI ordered. Please order Lumbar MRI so we can get all of this in order to obtain prior auth for the RFA. Thanks

## 2020-04-11 NOTE — Therapy (Signed)
[facility] [address], [place], [address] Phone: [phone]   Fax:  [phone]  Physical Therapy Treatment / Progress Note Reporting period: 03/14/2020 - 04/11/2020  Patient Details  Name: Anita Gibbs MRN: [id number] Date of Birth: 18-Apr-1971 Referring Provider (PT): Milinda Pointer, MD   Encounter Date: 04/11/2020   PT End of Session - 04/11/20 1821    Visit Number 7    Number of Visits 12    Date for PT Re-Evaluation 04/26/20    Authorization Type [employer] reporting period from 03/14/2020    Authorization - Number of Visits 7    Progress Note Due on Visit 10    PT Start Time 1812    PT Stop Time 1842    PT Time Calculation (min) 30 min    Activity Tolerance Patient limited by pain    Behavior During Therapy [facility] for tasks assessed/performed           Past Medical History:  Diagnosis Date  . Arthritis   . Carpal boss, right   . Carpal tunnel syndrome, bilateral   . Headache   . Hypertension   . Seasonal asthma 11/29/2018    Past Surgical History:  Procedure Laterality Date  . ANTERIOR CERVICAL DECOMP/DISCECTOMY FUSION N/A 07/01/2017   Procedure: ANTERIOR CERVICAL DECOMPRESSION/DISCECTOMY FUSION 3 LEVELS;  Surgeon: Meade Maw, MD;  Location: [facility] ORS;  Service: Neurosurgery;  Laterality: N/A;  . KNEE ARTHROSCOPY Right     There were no vitals filed for this visit.   Subjective Assessment - 04/11/20 1816    Subjective Patient reports she has 20/10 pain in her low back, R hip, and R knee and both shoulders after working 10 hours including mopping and waxing the floors. She was so sore after her FCE on monday that she did not work [employer] or yesterday. She is very sore again following work today. Patient reports she does not really feel like physical therapy is helping her.    Pertinent History Patient is a 49 y.o. female who presents to outpatient physical therapy with  a referral for medical diagnosis chronic bilateral low back pain without sciatica, lumbar facet joint syndrome, osteoarthritis of sacroiliac joint, somatic dysfunction of sacroiliac joint. This patient's chief complaints consist of low back pain and bilateral leg pain/paresthesia R > L with weakness especially at R knee, and neck pain with B UE paresthesia leading to the following functional deficits: difficulty walking, unable to run, bending, lifting, moving furniture, driving, grocery shopping, housework, work Paramedic. Usual activities take extra time and the pain and dysfunction decreases her quality of life. Relevant past medical history and comorbidities include allergies (hives and knots; foods trigger), HTN, reflux, hx of gout, chronic pain syndrome, hx of R knee surgery, chronic neck pain with intermittent B UE paresthesia with hx of cervical spine surgery, episodes of feeling her whole body cannot move. Patient denies hx of cancer, stroke, seizures, lung problem, major cardiac events, diabetes, unexplained weight loss, changes in bowel or bladder problems, new onset stumbling. Does drop things. Chart review reveals Dr. Dossie Arbour provided facet blocks that resolved pt's pain 100% but was temporary. He planned to perform ablation that is indicated for longer term relief in the setting of successful facet blocks. However, insurance would not approve this without MRI first. Insurance requires PT prior to approval of MRI, leading to current referral for PT.    Limitations Sitting;Lifting;Standing;Walking;House hold activities;Other (comment)   work,  lifting, bending, pushing, pulling   How long can you sit comfortably? can sit 2.5 hours (causes tolerable pain)    How long can you stand comfortably? 15-30 min before she must sit    How long can you walk comfortably? 1 hour    Diagnostic tests see chart    Patient Stated Goals "If PT can help with some of the pain, I am for it"    Currently in Pain?  Yes    Pain Score 10-Worst pain ever   20/10   Pain Location Back   hip and knee   Pain Orientation Right    Pain Onset More than a month ago           OBJECTIVE:   SPINE MOTION. Lumbar AROM *Indicates pain  Flexion: = 2 inches proximal to ankles, end range pain.   Extension: = 25%, pain during movement upon return.  Rotation: R = 75%, L = 75% (feels stiff).  Side Flexion: R = 3 inches proximal to patella, L = 3 inches proximal to patella (pain during movement and at end range bilaterally) .  Side glide: symmetrical and ERP bilaterally.   TREATMENT:  Therapeutic exercise:to centralize symptoms and improve ROM, strength, muscular endurance, and activity tolerance required for successful completion of functional activities.  -NuStep level1using bilateral upper and lower extremities. Seat setting 8. For improved extremity mobility, muscular endurance, and activity tolerance; and to induce the analgesic effect of aerobic exercise, stimulate improved joint nutrition, and prepare body structures and systems for following interventions. x59mnutes.AverageSPM = 46 - AROM lumbar spine to assess progress (see above).  - floor to table and back lift with 10# box. Patient willing to perform but very slow and painful with rounded back and minimal knee flexion. Discontinued due to pain.  - seated lumbar flexion roll out with clear theraball x5 but discontinued due to prickling in left butt cheek.   Seated on elevated seat with no back support: - marching,backunsupported,1 second holds,1x10 each side.  - isometric hip adduction against small green ball, 1x10, 4 second holds - isometric hip abduction against strap, 1x10, 4 seconds    HOME EXERCISE PROGRAM Access Code: [email or web address]: [email or web address] Date: 03/15/2020 Prepared by: SRosita Kea Exercises Seated March - 1 x daily - 3 sets - 5 reps - 1 second hold     PT Education - 04/11/20 1848     Education Details POC, progress, reccomendation to hold PT at this point    Person(s) Educated Patient    Methods Explanation    Comprehension Verbalized understanding            PT Short Term Goals - 04/11/20 1845      PT SHORT TERM GOAL #1   Title Be independent with initial home exercise program for self-management of symptoms.    Baseline initial HEP provided at IE (03/14/2020); limited participation due to pain (04/11/2020);    Time 2    Period Weeks    Status Partially Met    Target Date 03/29/20             PT Long Term Goals - 04/11/20 1846      PT LONG TERM GOAL #1   Title Be independent with a long-term home exercise program for self-management of symptoms.    Baseline Initial HEP provided HEP (03/14/2020);    Time 6    Period Weeks    Status Not Met   TARGET DATE FOR ALL LONG TERM GOALS: 04/26/2020  PT LONG TERM GOAL #2   Title Demonstrate improved FOTO score to equal or greater than 63 by visit 11 to demonstrate improvement in overall condition and self-reported functional ability.    Baseline 50 (03/14/2020); 40 (04/11/2020);    Time 6    Period Weeks    Status Not Met      PT LONG TERM GOAL #3   Title Patient will demonstrate at least 75% lumbar AROM with no compensations or increase in pain in all planes except intermittent end range discomfort to allow patient to complete valued activities with less difficulty.    Baseline pain during movement and restricted range (03/14/2020); no improvement (04/11/2020);    Time 6    Period Weeks    Status New      PT LONG TERM GOAL #4   Title Be able to demonstrate floor to waist lift with 25 box with proper form without limitation due to current condition in order to improve ability to lift during work activities.    Baseline not tested (03/14/2020); see FCE result (04/11/2020);    Time 6    Period Weeks    Status Not Met      PT LONG TERM GOAL #5   Title Complete community, work and/or recreational activities  without limitation due to current condition.    Baseline walking, unable to run, bending, lifting, moving furniture, driving, grocery shopping, housework, work activities. Usual activities take extra time (03/14/2020); patient reports overall worsening in her abilities since starting PT (04/11/2020);    Time 6    Period Weeks    Status Not Met                 Plan - 04/11/20 1839    Clinical Impression Statement Patient tolerated treatment with difficulty this session due to pain. Patient has attended 7 physical therapy sessions this episode of care and with no improvement noted. Patient continues to be limited by severe pain and reports increased pain following multiple sessions. Patient is highly limited by pain in clinic. Her FOTO score has worsened from 50 to 40 and her activity tolerance has decreased per observation. No significant change noted in lumbar ROM and patient has great difficulty with lifting floor to waist. Recommend holding PT until pt is able to return to physician at this time for further medical work up due to lack of positive response and actual worsening with PT at this point.    Personal Factors and Comorbidities Age;Comorbidity 3+;Behavior Pattern;Past/Current Experience;Fitness;Social Background;Time since onset of injury/illness/exacerbation;Education    Comorbidities Relevant past medical history and comorbidities include allergies (hives and knots; foods trigger), HTN, reflux, hx of gout, chronic pain syndrome, hx of R knee surgery, chronic neck pain with intermittent B UE paresthesia with hx of cervical spine surgery, episodes of feeling her whole body cannot move.    Examination-Activity Limitations Lift;Stand;Locomotion Level;Bend;Carry;Sit;Sleep;Squat;Stairs;Other    Examination-Participation Restrictions Yard Work;Cleaning;Meal Prep;Community Activity;Driving;Interpersonal Relationship;Laundry;Shop   walking, unable to run, bending, lifting, moving furniture,  driving, grocery shopping, housework, work activities. Usual activities take extra time.   Stability/Clinical Decision Making Evolving/Moderate complexity    Rehab Potential Fair    PT Frequency 2x / week    PT Duration 6 weeks    PT Treatment/Interventions ADLs/Self Care Home Management;Aquatic Therapy;Cryotherapy;Electrical Stimulation;Moist Heat;Traction;Gait training;Stair training;Functional mobility training;Therapeutic activities;Therapeutic exercise;Balance training;Neuromuscular re-education;Patient/family education;Manual techniques;Dry needling;Spinal Manipulations;Joint Manipulations    PT Next Visit Plan hold PT while pt returns to physician for further medical evaluation  PT Home Exercise Plan Medbridge Access Code: [email or web address]    Consulted and Agree with Plan of Care Patient           Patient will benefit from skilled therapeutic intervention in order to improve the following deficits and impairments:  Increased fascial restricitons, Impaired sensation, Pain, Improper body mechanics, Decreased mobility, Increased muscle spasms, Impaired UE functional use, Impaired perceived functional ability, Hypomobility, Decreased strength, Decreased range of motion, Decreased endurance, Decreased activity tolerance, Difficulty walking, Impaired flexibility, Obesity  Visit Diagnosis: Chronic bilateral low back pain, unspecified whether sciatica present  Muscle weakness (generalized)     Problem List Patient Active Problem List   Diagnosis Date Noted  . History of illicit drug use 33/82/5053  . Pain medication agreement broken 03/07/2020  . Multiple food allergies 03/07/2020  . Excessive daytime sleepiness 03/06/2020  . Seasonal allergies 03/06/2020  . Marijuana use 03/06/2020  . Abnormal drug screen 03/06/2020  . Multiple environmental allergies 01/17/2020  . Other intervertebral disc degeneration, lumbar region 11/15/2019  . Prediabetes 10/10/2019  . HLD (hyperlipidemia)  10/10/2019  . Gastroesophageal reflux disease 09/06/2019  . Fibroid uterus 09/06/2019  . Rash 08/08/2019  . Spondylosis without myelopathy or radiculopathy, lumbosacral region 05/19/2019  . Lumbar facet syndrome (Bilateral) (R>L) 04/20/2019  . Spondylosis without myelopathy or radiculopathy, cervical region 03/22/2019  . Seasonal allergic rhinitis due to pollen 11/29/2018  . Seasonal asthma 11/29/2018  . Neurogenic pain 10/27/2018  . Chronic musculoskeletal pain 10/27/2018  . History of allergy to shellfish 09/28/2018  . Chronic upper extremity pain (Secondary Area of Pain) (Bilateral) (R>L) 09/27/2018  . Cervicalgia (Primary Area of Pain) (Bilateral) (L>R) 09/27/2018  . History of fusion of cervical spine (ACDF C4-C7) 09/27/2018  . Abnormal MRI, cervical spine (06/10/2018) 09/27/2018  . 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 Cervical facet joint syndrome (Bilateral) (L>R) 09/27/2018  . Cervical facet hypertrophy (C3-T1) 09/27/2018  . 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 Chronic sacroiliac joint dysfunction (Bilateral) 09/27/2018  . 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  . Somatic dysfunction of sacroiliac joint (Bilateral) 09/27/2018  . DDD (degenerative disc disease), cervical 09/27/2018  . Chronic neck pain (Bilateral) w/ history of cervical spinal surgery 09/27/2018  . Spondylosis, cervical, w/ myelopathy 09/27/2018  . Cervical spondylosis 09/27/2018  . DDD (degenerative disc disease), lumbar 09/27/2018  . Vitamin D deficiency 09/27/2018  . Elevated sed rate 09/08/2018  . Chronic neck pain (Primary Area of Pain) (Bilateral) (L>R) 09/07/2018  . Chronic low back pain [facility] Area of Pain) (Bilateral) (R>L) w/ sciatica (Bilateral) 09/07/2018  . Chronic lower extremity pain (Fourth Area of Pain) (Bilateral) (R>L) 09/07/2018  . Chronic pain syndrome 09/07/2018  . Pharmacologic therapy 09/07/2018   . Disorder of skeletal system 09/07/2018  . Problems influencing health status 09/07/2018  . Chronic sacroiliac joint pain (Right) 09/07/2018  . Chronic low back pain [facility] Area of Pain) (Bilateral) (R>L) w/o sciatica 07/26/2018  . Essential hypertension 05/31/2018  . Chronic gout of foot (Left) 05/31/2018  . Numbness and tingling of both feet 05/31/2018  . Hyperkalemia 05/31/2018  . Cervical myelopathy ([place]) 07/01/2017   [name]. Graylon Good, PT, DPT 04/11/20, 6:49 PM  [facility] [address] [place], [address] Phone: [phone]   Fax:  [phone]  Name: Anita Gibbs MRN: [id number] Date of Birth: Sep 18, 1970

## 2020-04-11 NOTE — Telephone Encounter (Signed)
Please order

## 2020-04-12 ENCOUNTER — Encounter: Payer: BC Managed Care – PPO | Admitting: Physical Therapy

## 2020-04-16 ENCOUNTER — Other Ambulatory Visit: Payer: Self-pay | Admitting: Pain Medicine

## 2020-04-16 ENCOUNTER — Encounter: Payer: Self-pay | Admitting: Pain Medicine

## 2020-04-16 DIAGNOSIS — M5136 Other intervertebral disc degeneration, lumbar region: Secondary | ICD-10-CM

## 2020-04-16 DIAGNOSIS — G8929 Other chronic pain: Secondary | ICD-10-CM

## 2020-04-16 NOTE — Telephone Encounter (Signed)
I ordered the MRI, but there is one more requirement. She needs to bring her BMI to less than 35. Currently it is  BMI 43.40 kg/m .

## 2020-04-17 ENCOUNTER — Encounter: Payer: BC Managed Care – PPO | Admitting: Physical Therapy

## 2020-04-17 ENCOUNTER — Ambulatory Visit: Payer: BC Managed Care – PPO | Admitting: Physical Therapy

## 2020-04-17 ENCOUNTER — Ambulatory Visit: Payer: BC Managed Care – PPO | Attending: Pain Medicine | Admitting: Pain Medicine

## 2020-04-17 ENCOUNTER — Other Ambulatory Visit: Payer: Self-pay

## 2020-04-17 DIAGNOSIS — M5442 Lumbago with sciatica, left side: Secondary | ICD-10-CM

## 2020-04-17 DIAGNOSIS — M79605 Pain in left leg: Secondary | ICD-10-CM

## 2020-04-17 DIAGNOSIS — M5416 Radiculopathy, lumbar region: Secondary | ICD-10-CM | POA: Diagnosis not present

## 2020-04-17 DIAGNOSIS — M5441 Lumbago with sciatica, right side: Secondary | ICD-10-CM

## 2020-04-17 DIAGNOSIS — M5136 Other intervertebral disc degeneration, lumbar region: Secondary | ICD-10-CM

## 2020-04-17 DIAGNOSIS — G8929 Other chronic pain: Secondary | ICD-10-CM

## 2020-04-17 DIAGNOSIS — M79604 Pain in right leg: Secondary | ICD-10-CM | POA: Diagnosis not present

## 2020-04-17 HISTORY — DX: Radiculopathy, lumbar region: M54.16

## 2020-04-17 NOTE — Patient Instructions (Signed)
____________________________________________________________________________________________  Preparing for Procedure with Sedation  Procedure appointments are limited to planned procedures: . No Prescription Refills. . No disability issues will be discussed. . No medication changes will be discussed.  Instructions: . Oral Intake: Do not eat or drink anything for at least 8 hours prior to your procedure. (Exception: Blood Pressure Medication. See below.) . Transportation: Unless otherwise stated by your physician, you may drive yourself after the procedure. . Blood Pressure Medicine: Do not forget to take your blood pressure medicine with a sip of water the morning of the procedure. If your Diastolic (lower reading)is above 100 mmHg, elective cases will be cancelled/rescheduled. . Blood thinners: These will need to be stopped for procedures. Notify our staff if you are taking any blood thinners. Depending on which one you take, there will be specific instructions on how and when to stop it. . Diabetics on insulin: Notify the staff so that you can be scheduled 1st case in the morning. If your diabetes requires high dose insulin, take only  of your normal insulin dose the morning of the procedure and notify the staff that you have done so. . Preventing infections: Shower with an antibacterial soap the morning of your procedure. . Build-up your immune system: Take 1000 mg of Vitamin C with every meal (3 times a day) the day prior to your procedure. . Antibiotics: Inform the staff if you have a condition or reason that requires you to take antibiotics before dental procedures. . Pregnancy: If you are pregnant, call and cancel the procedure. . Sickness: If you have a cold, fever, or any active infections, call and cancel the procedure. . Arrival: You must be in the facility at least 30 minutes prior to your scheduled procedure. . Children: Do not bring children with you. . Dress appropriately:  Bring dark clothing that you would not mind if they get stained. . Valuables: Do not bring any jewelry or valuables.  Reasons to call and reschedule or cancel your procedure: (Following these recommendations will minimize the risk of a serious complication.) . Surgeries: Avoid having procedures within 2 weeks of any surgery. (Avoid for 2 weeks before or after any surgery). . Flu Shots: Avoid having procedures within 2 weeks of a flu shots or . (Avoid for 2 weeks before or after immunizations). . Barium: Avoid having a procedure within 7-10 days after having had a radiological study involving the use of radiological contrast. (Myelograms, Barium swallow or enema study). . Heart attacks: Avoid any elective procedures or surgeries for the initial 6 months after a "Myocardial Infarction" (Heart Attack). . Blood thinners: It is imperative that you stop these medications before procedures. Let us know if you if you take any blood thinner.  . Infection: Avoid procedures during or within two weeks of an infection (including chest colds or gastrointestinal problems). Symptoms associated with infections include: Localized redness, fever, chills, night sweats or profuse sweating, burning sensation when voiding, cough, congestion, stuffiness, runny nose, sore throat, diarrhea, nausea, vomiting, cold or Flu symptoms, recent or current infections. It is specially important if the infection is over the area that we intend to treat. . Heart and lung problems: Symptoms that may suggest an active cardiopulmonary problem include: cough, chest pain, breathing difficulties or shortness of breath, dizziness, ankle swelling, uncontrolled high or unusually low blood pressure, and/or palpitations. If you are experiencing any of these symptoms, cancel your procedure and contact your primary care physician for an evaluation.  Remember:  Regular Business hours are:    Monday to Thursday 8:00 AM to 4:00 PM  Provider's  Schedule: Darius Lundberg, MD:  Procedure days: Tuesday and Thursday 7:30 AM to 4:00 PM  Bilal Lateef, MD:  Procedure days: Monday and Wednesday 7:30 AM to 4:00 PM ____________________________________________________________________________________________    

## 2020-04-17 NOTE — Telephone Encounter (Signed)
MRI ordered

## 2020-04-17 NOTE — Progress Notes (Addendum)
Gibbs: Anita Gibbs  Service Category: E/M  Provider: Gaspar Cola, MD  DOB: Jun 16, 1971  DOS: 04/17/2020  Location: Office  MRN: [id number]  Setting: Ambulatory outpatient  Referring Provider: McLean-Scocuzza, Olivia Mackie *  Type: Established Gibbs  Specialty: Interventional Pain Management  PCP: McLean-Scocuzza, Nino Glow, MD  Location: Remote location  Delivery: TeleHealth     Virtual Encounter - Pain Management PROVIDER NOTE: Information contained herein reflects review and annotations entered in association with encounter. Interpretation of such information and data should be left to medically-trained personnel. Information provided to Gibbs can be located elsewhere in Anita medical record under "Gibbs Instructions". Document created using STT-dictation technology, any transcriptional errors that may result from process are unintentional.    Contact & Pharmacy Preferred: [phone] Home: [phone] (home) Mobile: [phone] (mobile) E-mail: [email or web address] .[place], [place] - [address] [place] [address] Phone: [phone] Fax: [phone]   Pre-screening  Anita Gibbs offered "in-person" vs "virtual" encounter. She indicated preferring virtual for this encounter.   Reason COVID-19*  Social distancing based on CDC and AMA recommendations.   I contacted [name] on 04/17/2020 via telephone.      I clearly identified myself as Gaspar Cola, MD. I verified that I was speaking with Anita correct person using two identifiers (Name: Anita Gibbs, and date of birth: 05-Jan-1971).  Consent I sought verbal advanced consent from [name] for virtual visit interactions. I informed Anita Gibbs of possible security and privacy concerns, risks, and limitations associated with providing "not-in-person" medical evaluation and management services. I also informed Anita Gibbs of Anita availability of "in-person"  appointments. Finally, I informed her that there would be a charge for Anita virtual visit and that she could be  personally, fully or partially, financially responsible for it. Anita Gibbs expressed understanding and agreed to proceed.   Historic Elements   Anita Gibbs is a 49 y.o. year old, female Gibbs evaluated today after her last contact with our practice on 04/11/2020. Anita Gibbs  has a past medical history of Arthritis, Carpal boss, right, Carpal tunnel syndrome, bilateral, Headache, Hypertension, and Seasonal asthma (11/29/2018). She also  has a past surgical history that includes Knee arthroscopy (Right) and Anterior cervical decomp/discectomy fusion (N/A, 07/01/2017). Anita Gibbs has a current medication list which includes Anita following prescription(s): albuterol, amlodipine, atorvastatin, cetirizine hcl, vitamin d-3, diphenhydramine, epinephrine, erythromycin, fluticasone, gabapentin, hydrochlorothiazide, hydrocortisone, losartan, meloxicam, montelukast, norethindrone, olopatadine hcl, pantoprazole, and methocarbamol. She  reports that she has never smoked. She has never used smokeless tobacco. She reports current alcohol use. She reports that she does not use drugs. Anita Gibbs is allergic to other, shellfish allergy, strawberry (diagnostic), and tomato.   HPI  Today, she is being contacted for Evaluation.  Anita Gibbs indicates that she would like to know what can be done for Anita pain until she can get that radiofrequency approved.  Today I reviewed her symptoms and she indicates that she is having low back pain and right lower extremity pain but she also indicates Anita right lower extremity pain to be going all Anita way down to Anita top of her foot and what appears to be an L5 dermatomal distribution.  She also is complaining of having quite a bit of pain down Anita leg and dragging her foot which would suggest a "foot drop".  According to what she describes, it is likely that she may be  having an L5  radiculopathy on Anita right side.  She describes recently having gone to Anita emergency room due to Anita pain.  Today I have offered Anita Gibbs to bring her in for a right-sided L4-5 LESI under fluoroscopic guidance.  She indicates that she would like to come in as soon as possible.  I informed Anita Gibbs that I will be taking some personal time off and I asked her if she would mind if Anita procedure gets done by my partner Dr. Gillis Santa.  She indicated that she would be okay with that.  She stated that she would like to have Anita procedure done with sedation.  Pharmacotherapy Assessment  Analgesic: Abnormal UDS (02/01/20) (+) undisclosed THC (marijuana). Opioid therapy terminated 03/07/2020.  Oxycodone IR 5 mg, 1 tab PO QOD (2.5 mg/day of oxycodone) (last filled on 01/23/2020) MME/day:7.38m/day.   Monitoring: [place] PMP: PDMP reviewed during this encounter.       Pharmacotherapy: No side-effects or adverse reactions reported. Compliance: No problems identified. Effectiveness: Clinically acceptable. Plan: Refer to "POC".  UDS:  Summary  Date Value Ref Range Status  02/01/2020 Note  Final    Comment:    ==================================================================== [employer] Select 13 (MW) ==================================================================== Test                             Result       Flag       Units Drug Present not Declared for Prescription Verification   Carboxy-THC                    20           UNEXPECTED ng/mg creat    Carboxy-THC is a metabolite of tetrahydrocannabinol (THC). Source of    THC is most commonly herbal marijuana or marijuana-based products,    but THC is also present in a scheduled prescription medication.    Trace amounts of THC can be present in hemp and cannabidiol (CBD)    products. This test is not intended to distinguish between delta-9-    tetrahydrocannabinol, Anita predominant form of THC in most herbal or    marijuana-based  products, and delta-8-tetrahydrocannabinol. Drug Absent but Declared for Prescription Verification   Oxycodone                      Not Detected UNEXPECTED ng/mg creat ==================================================================== Test                      Result    Flag   Units      Ref Range   Creatinine              230              mg/dL      >=20 ==================================================================== Declared Medications:  Anita flagging and interpretation on this report are based on Anita  following declared medications.  Unexpected results may arise from  inaccuracies in Anita declared medications.  **Note: Anita testing scope of this panel includes these medications:  Oxycodone (Roxicodone)  **Note: Anita testing scope of this panel does not include Anita  following reported medications:  Albuterol (Ventolin HFA)  Amlodipine (Norvasc)  Atorvastatin (Lipitor)  Cetirizine  Diphenhydramine (Benadryl)  Epinephrine (EpiPen)  Fluticasone (Flonase)  Gabapentin (Neurontin)  Hydrochlorothiazide  Losartan (Cozaar)  Meloxicam (Mobic)  Methocarbamol (Robaxin)  Montelukast (Singulair)  Norethindrone (Aygestin)  Pantoprazole (Protonix)  Vitamin D3 ==================================================================== For clinical  consultation, please call [phone]. ====================================================================     Laboratory Chemistry Profile   Renal Lab Results  Component Value Date   BUN 17 02/14/2020   CREATININE 1.05 02/14/2020   BCR 13 09/07/2018   GFR 68.09 02/14/2020   GFRAA 76 09/07/2018   GFRNONAA 66 09/07/2018     Hepatic Lab Results  Component Value Date   AST 14 02/14/2020   ALT 17 02/14/2020   ALBUMIN 3.9 02/14/2020   ALKPHOS 36 (L) 02/14/2020     Electrolytes Lab Results  Component Value Date   NA 136 02/14/2020   K 4.1 02/14/2020   CL 104 02/14/2020   CALCIUM 9.1 02/14/2020   MG 2.2 09/07/2018     Bone Lab  Results  Component Value Date   VD25OH 34.98 02/14/2020   25OHVITD1 8.1 (L) 09/07/2018   25OHVITD2 <1.0 09/07/2018   25OHVITD3 8.1 09/07/2018     Inflammation (CRP: Acute Phase) (ESR: Chronic Phase) Lab Results  Component Value Date   CRP 7 09/07/2018   ESRSEDRATE 67 (H) 09/07/2018       Note: Above Lab results reviewed.   Imaging  DG Foot Complete Left CLINICAL DATA:  Bilat pain  EXAM: LEFT FOOT - COMPLETE 3+ VIEW  COMPARISON:  05/31/2018  FINDINGS: Osseous mineralization normal.  Joint spaces preserved.  Small plantar and Achilles insertion calcaneal spurs.  Dorsal soft tissue swelling over Anita distal metatarsals.  No acute fracture, dislocation, or bone destruction.  IMPRESSION: Calcaneal spurring.  No acute osseous abnormalities.  Electronically Signed   By: Lavonia Dana M.D.   On: 12/07/2019 08:21 DG Foot Complete Right CLINICAL DATA:  BILATERAL foot pain  EXAM: RIGHT FOOT COMPLETE - 3+ VIEW  COMPARISON:  None  FINDINGS: Small plantar and Achilles insertion calcaneal spurs.  Osseous mineralization normal.  No fracture, dislocation, or bone destruction.  IMPRESSION: Calcaneal spurring.  No acute osseous abnormalities.  Electronically Signed   By: Lavonia Dana M.D.   On: 12/07/2019 08:20  Assessment  Anita primary encounter diagnosis was Chronic lower extremity pain (Fourth Area of Pain) (Bilateral) (R>L). 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  Plan of Care  Problem-specific:  No problem-specific Assessment & Plan notes found for this encounter.  Ms. HARLI ENGELKEN has a current medication list which includes Anita following long-term medication(s): albuterol, amlodipine, atorvastatin, cetirizine hcl, diphenhydramine, fluticasone, gabapentin,  hydrochlorothiazide, losartan, montelukast, norethindrone, pantoprazole, and methocarbamol.  Pharmacotherapy (Medications Ordered): No orders of Anita defined types were placed in this encounter.  Orders:  Orders Placed This Encounter  Procedures  . Lumbar Epidural Injection    Standing Status:   Future    Standing Expiration Date:   05/18/2020    Scheduling Instructions:     Procedure: Interlaminar Lumbar Epidural Steroid injection (LESI)  L4-5     Laterality: Right-sided     Sedation: With Sedation.     Timeframe: ASAA with Dr. Holley Raring.    Order Specific Question:   Where will this procedure be performed?    Answer:   [facility]   Follow-up plan:   Return for Procedure (w/ sedation): (R) L4-5 LESI #1 as soon as possible with Dr. Holley Raring..      Interventional management options:  Considering:   NOTE: Shellfish allergy Possible bilateral cervical facetRFA Diagnostic right L3-4 LESI Diagnostic bilateral L3 vs L4 TFESI  Possible bilateral lumbar facetRFA #1(starting with Anita left side) Diagnostic right vs bilateral SI joint injection Possible right vs bilateral SI jointRFA   Palliative PRN treatment(s):   Diagnostic/therapeutic bilateral lumbar facet block #4 (100/100/100) (100/100/100) (100/100/30) Palliative right CESI #4  Diagnostic bilateral cervicalfacet block #2      Recent Visits Date Type Provider Dept  03/07/20 Telemedicine Milinda Pointer, [facility]  02/02/20 Office Visit Milinda Pointer, MD [facility]  Showing recent visits within past 90 days and meeting all other requirements Today's Visits Date Type Provider Dept  04/17/20 Telemedicine Milinda Pointer, MD [facility]  Showing today's visits and meeting all other requirements Future Appointments No visits were found meeting these conditions. Showing future appointments within next 90 days and meeting all other requirements  I discussed Anita  assessment and treatment plan with Anita Gibbs. Anita Gibbs was provided an opportunity to ask questions and all were answered. Anita Gibbs agreed with Anita plan and demonstrated an understanding of Anita instructions.  Gibbs advised to call back or seek an in-person evaluation if Anita symptoms or condition worsens.  Duration of encounter: 18 minutes.  Note by: Gaspar Cola, MD Date: 04/17/2020; Time: 12:54 PM

## 2020-04-19 ENCOUNTER — Encounter: Payer: BC Managed Care – PPO | Admitting: Physical Therapy

## 2020-04-19 ENCOUNTER — Ambulatory Visit: Payer: BC Managed Care – PPO | Admitting: Physical Therapy

## 2020-04-20 ENCOUNTER — Ambulatory Visit: Payer: BC Managed Care – PPO | Admitting: Internal Medicine

## 2020-04-23 ENCOUNTER — Other Ambulatory Visit: Payer: Self-pay

## 2020-04-23 ENCOUNTER — Ambulatory Visit (HOSPITAL_BASED_OUTPATIENT_CLINIC_OR_DEPARTMENT_OTHER): Payer: BC Managed Care – PPO | Admitting: Student in an Organized Health Care Education/Training Program

## 2020-04-23 ENCOUNTER — Telehealth: Payer: Self-pay | Admitting: *Deleted

## 2020-04-23 ENCOUNTER — Ambulatory Visit
Admission: RE | Admit: 2020-04-23 | Discharge: 2020-04-23 | Disposition: A | Discharge status: Home / Self Care | Payer: BC Managed Care – PPO | Source: Ambulatory Visit | Attending: Student in an Organized Health Care Education/Training Program | Admitting: Student in an Organized Health Care Education/Training Program

## 2020-04-23 ENCOUNTER — Encounter: Payer: Self-pay | Admitting: Student in an Organized Health Care Education/Training Program

## 2020-04-23 VITALS — BP 112/59 | HR 64 | Temp 97.3°F | Resp 18 | Ht 63.0 in | Wt 251.0 lb

## 2020-04-23 DIAGNOSIS — G894 Chronic pain syndrome: Secondary | ICD-10-CM | POA: Insufficient documentation

## 2020-04-23 DIAGNOSIS — M5416 Radiculopathy, lumbar region: Secondary | ICD-10-CM

## 2020-04-23 MED ORDER — FENTANYL CITRATE (PF) 100 MCG/2ML IJ SOLN
INTRAMUSCULAR | Status: AC
  Filled 2020-04-23: qty 2

## 2020-04-23 MED ORDER — IOHEXOL 180 MG/ML  SOLN
10.0000 mL | Freq: Once | INTRAMUSCULAR | Status: AC
  Administered 2020-04-23: 10 mL via EPIDURAL

## 2020-04-23 MED ORDER — DEXAMETHASONE SODIUM PHOSPHATE 10 MG/ML IJ SOLN
10.0000 mg | Freq: Once | INTRAMUSCULAR | Status: AC
  Administered 2020-04-23: 10 mg

## 2020-04-23 MED ORDER — ROPIVACAINE HCL 2 MG/ML IJ SOLN
3.0000 mL | Freq: Once | INTRAMUSCULAR | Status: AC
  Administered 2020-04-23: 3 mL via EPIDURAL

## 2020-04-23 MED ORDER — SODIUM CHLORIDE 0.9% FLUSH
2.0000 mL | Freq: Once | INTRAVENOUS | Status: AC
  Administered 2020-04-23: 2 mL

## 2020-04-23 MED ORDER — FENTANYL CITRATE (PF) 100 MCG/2ML IJ SOLN
25.0000 ug | INTRAMUSCULAR | Status: DC | PRN
  Administered 2020-04-23: 50 ug via INTRAVENOUS

## 2020-04-23 MED ORDER — DEXAMETHASONE SODIUM PHOSPHATE 10 MG/ML IJ SOLN
INTRAMUSCULAR | Status: AC
  Filled 2020-04-23: qty 1

## 2020-04-23 MED ORDER — SODIUM CHLORIDE (PF) 0.9 % IJ SOLN
INTRAMUSCULAR | Status: AC
  Filled 2020-04-23: qty 10

## 2020-04-23 MED ORDER — ROPIVACAINE HCL 2 MG/ML IJ SOLN
INTRAMUSCULAR | Status: AC
  Filled 2020-04-23: qty 10

## 2020-04-23 MED ORDER — LIDOCAINE HCL 2 % IJ SOLN
20.0000 mL | Freq: Once | INTRAMUSCULAR | Status: AC
  Administered 2020-04-23: 400 mg

## 2020-04-23 MED ORDER — LIDOCAINE HCL 2 % IJ SOLN
INTRAMUSCULAR | Status: AC
  Filled 2020-04-23: qty 20

## 2020-04-23 NOTE — Patient Instructions (Signed)

## 2020-04-23 NOTE — Progress Notes (Signed)
Safety precautions to be maintained throughout the outpatient stay will include: orient to surroundings, keep bed in low position, maintain call bell within reach at all times, provide assistance with transfer out of bed and ambulation.  

## 2020-04-23 NOTE — Progress Notes (Signed)
PROVIDER NOTE: Information contained herein reflects review and annotations entered in association with encounter. Interpretation of such information and data should be left to medically-trained personnel. Information provided to patient can be located elsewhere in the medical record under "Patient Instructions". Document created using STT-dictation technology, any transcriptional errors that may result from process are unintentional.    Patient: Anita Gibbs  Service Category: Procedure  Provider: Gillis Santa, MD  DOB: Feb 11, 1971  DOS: 04/23/2020  Location: [facility]  MRN: [id number]  Setting: Ambulatory - outpatient  Referring Provider: McLean-Scocuzza, Olivia Mackie *  Type: Established Patient  Specialty: Interventional Pain Management  PCP: McLean-Scocuzza, Nino Glow, MD   Primary Reason for Visit: Interventional Pain Management Treatment. CC: Back Pain (lumbar bilateral )  Procedure:          Anesthesia, Analgesia, Anxiolysis:  Type: Diagnostic Inter-Laminar Epidural Steroid Injection  #1  Region: Lumbar Level: L4-5 Level. Laterality: Right-Sided         Type: Moderate (Conscious) Sedation combined with Local Anesthesia Indication(s): Analgesia and Anxiety Route: Intravenous (IV) IV Access: Secured Sedation: Meaningful verbal contact was maintained at all times during the procedure  Local Anesthetic: Lidocaine 1-2%  Position: Prone with head of the table was raised to facilitate breathing.   Indications: 1. Lumbar radiculitis (L5 dermatome) (Right)   2. Chronic pain syndrome    Pain Score: Pre-procedure: 10-Worst pain ever/10 Post-procedure: 1 /10   Pre-op Assessment:  Anita Gibbs is a 49 y.o. (year old), female patient, seen today for interventional treatment. She  has a past surgical history that includes Knee arthroscopy (Right) and Anterior cervical decomp/discectomy fusion (N/A, 07/01/2017). Anita Gibbs has a current medication list which includes the following  prescription(s): albuterol, amlodipine, atorvastatin, cetirizine hcl, vitamin d-3, diphenhydramine, epinephrine, erythromycin, fluticasone, gabapentin, hydrochlorothiazide, hydrocortisone, losartan, meloxicam, montelukast, norethindrone, olopatadine hcl, pantoprazole, and methocarbamol, and the following Facility-Administered Medications: fentanyl. Her primarily concern today is the Back Pain (lumbar bilateral )  Initial Vital Signs:  Pulse/HCG Rate: 70ECG Heart Rate: 67 Temp: (!) 97.5 F (36.4 C) Resp: 16 BP: 129/73 SpO2: 100 %  BMI: Estimated body mass index is 44.46 kg/m as calculated from the following:   Height as of this encounter: 5\' 3"  (1.6 m).   Weight as of this encounter: 251 lb (113.9 kg).  Risk Assessment: Allergies: Reviewed. She is allergic to other, shellfish allergy, strawberry (diagnostic), and tomato.  Allergy Precautions: None required Coagulopathies: Reviewed. None identified.  Blood-thinner therapy: None at this time Active Infection(s): Reviewed. None identified. Anita Gibbs is afebrile  Site Confirmation: Anita Gibbs was asked to confirm the procedure and laterality before marking the site Procedure checklist: Completed Consent: Before the procedure and under the influence of no sedative(s), amnesic(s), or anxiolytics, the patient was informed of the treatment options, risks and possible complications. To fulfill our ethical and legal obligations, as recommended by the American Medical Association's Code of Ethics, I have informed the patient of my clinical impression; the nature and purpose of the treatment or procedure; the risks, benefits, and possible complications of the intervention; the alternatives, including doing nothing; the risk(s) and benefit(s) of the alternative treatment(s) or procedure(s); and the risk(s) and benefit(s) of doing nothing. The patient was provided information about the general risks and possible complications associated with the  procedure. These may include, but are not limited to: failure to achieve desired goals, infection, bleeding, organ or nerve damage, allergic reactions, paralysis, and death. In addition, the patient was informed of those risks and  complications associated to Spine-related procedures, such as failure to decrease pain; infection (i.e.: Meningitis, epidural or intraspinal abscess); bleeding (i.e.: epidural hematoma, subarachnoid hemorrhage, or any other type of intraspinal or peri-dural bleeding); organ or nerve damage (i.e.: Any type of peripheral nerve, nerve root, or spinal cord injury) with subsequent damage to sensory, motor, and/or autonomic systems, resulting in permanent pain, numbness, and/or weakness of one or several areas of the body; allergic reactions; (i.e.: anaphylactic reaction); and/or death. Furthermore, the patient was informed of those risks and complications associated with the medications. These include, but are not limited to: allergic reactions (i.e.: anaphylactic or anaphylactoid reaction(s)); adrenal axis suppression; blood sugar elevation that in diabetics may result in ketoacidosis or comma; water retention that in patients with history of congestive heart failure may result in shortness of breath, pulmonary edema, and decompensation with resultant heart failure; weight gain; swelling or edema; medication-induced neural toxicity; particulate matter embolism and blood vessel occlusion with resultant organ, and/or nervous system infarction; and/or aseptic necrosis of one or more joints. Finally, the patient was informed that Medicine is not an exact science; therefore, there is also the possibility of unforeseen or unpredictable risks and/or possible complications that may result in a catastrophic outcome. The patient indicated having understood very clearly. We have given the patient no guarantees and we have made no promises. Enough time was given to the patient to ask questions, all of  which were answered to the patient's satisfaction. Anita Gibbs has indicated that she wanted to continue with the procedure. Attestation: I, the ordering provider, attest that I have discussed with the patient the benefits, risks, side-effects, alternatives, likelihood of achieving goals, and potential problems during recovery for the procedure that I have provided informed consent. Date  Time: 04/23/2020  8:24 AM  Pre-Procedure Preparation:  Monitoring: As per clinic protocol. Respiration, ETCO2, SpO2, BP, heart rate and rhythm monitor placed and checked for adequate function Safety Precautions: Patient was assessed for positional comfort and pressure points before starting the procedure. Time-out: I initiated and conducted the "Time-out" before starting the procedure, as per protocol. The patient was asked to participate by confirming the accuracy of the "Time Out" information. Verification of the correct person, site, and procedure were performed and confirmed by me, the nursing staff, and the patient. "Time-out" conducted as per Joint Commission's Universal Protocol (UP.01.01.01). Time: 9323  Description of Procedure:          Target Area: The interlaminar space, initially targeting the lower laminar border of the superior vertebral body. Approach: Paramedial approach. Area Prepped: Entire Posterior Lumbar Region DuraPrep (Iodine Povacrylex [0.7% available iodine] and Isopropyl Alcohol, 74% w/w) Safety Precautions: Aspiration looking for blood return was conducted prior to all injections. At no point did we inject any substances, as a needle was being advanced. No attempts were made at seeking any paresthesias. Safe injection practices and needle disposal techniques used. Medications properly checked for expiration dates. SDV (single dose vial) medications used. Description of the Procedure: Protocol guidelines were followed. The procedure needle was introduced through the skin, ipsilateral to  the reported pain, and advanced to the target area. Bone was contacted and the needle walked caudad, until the lamina was cleared. The epidural space was identified using "loss-of-resistance technique" with 2-3 ml of PF-NaCl (0.9% NSS), in a 5cc LOR glass syringe.  Vitals:   04/23/20 0903 04/23/20 0913 04/23/20 0923 04/23/20 0935  BP: 118/76 (!) 114/42 (!) 104/57 (!) 112/59  Pulse: 64     Resp:  18 20 18 18   Temp:  (!) 97.3 F (36.3 C)    TempSrc:      SpO2: 100% 100% 100% 100%  Weight:      Height:        Start Time: 0857 hrs. End Time: 0903 hrs.  Materials:  Needle(s) Type: Epidural needle Gauge: 17G Length: 5-in Medication(s): Please see orders for medications and dosing details. 6 cc solution made of 3 cc of preservative-free saline, 2 cc of 0.2% ropivacaine, 1 cc of Decadron 10 mg/cc.  Imaging Guidance (Spinal):          Type of Imaging Technique: Fluoroscopy Guidance (Spinal) Indication(s): Assistance in needle guidance and placement for procedures requiring needle placement in or near specific anatomical locations not easily accessible without such assistance. Exposure Time: Please see nurses notes. Contrast: Before injecting any contrast, we confirmed that the patient did not have an allergy to iodine, shellfish, or radiological contrast. Once satisfactory needle placement was completed at the desired level, radiological contrast was injected. Contrast injected under live fluoroscopy. No contrast complications. See chart for type and volume of contrast used. Fluoroscopic Guidance: I was personally present during the use of fluoroscopy. "Tunnel Vision Technique" used to obtain the best possible view of the target area. Parallax error corrected before commencing the procedure. "Direction-depth-direction" technique used to introduce the needle under continuous pulsed fluoroscopy. Once target was reached, antero-posterior, oblique, and lateral fluoroscopic projection used confirm  needle placement in all planes. Images permanently stored in EMR. Interpretation: I personally interpreted the imaging intraoperatively. Adequate needle placement confirmed in multiple planes. Appropriate spread of contrast into desired area was observed. No evidence of afferent or efferent intravascular uptake. No intrathecal or subarachnoid spread observed. Permanent images saved into the patient's record.  Antibiotic Prophylaxis:   Anti-infectives (From admission, onward)   None     Indication(s): None identified  Post-operative Assessment:  Post-procedure Vital Signs:  Pulse/HCG Rate: 6465 Temp: (!) 97.3 F (36.3 C) Resp: 18 BP: (!) 112/59 SpO2: 100 %  EBL: None  Complications: No immediate post-treatment complications observed by team, or reported by patient.  Note: The patient tolerated the entire procedure well. A repeat set of vitals were taken after the procedure and the patient was kept under observation following institutional policy, for this type of procedure. Post-procedural neurological assessment was performed, showing return to baseline, prior to discharge. The patient was provided with post-procedure discharge instructions, including a section on how to identify potential problems. Should any problems arise concerning this procedure, the patient was given instructions to immediately contact us, at any time, without hesitation. In any case, we plan to contact the patient by telephone for a follow-up status report regarding this interventional procedure.  Comments:  No additional relevant information.  Plan of Care  Orders:  Orders Placed This Encounter  Procedures  . [facility] C-ARM 1-60 MIN NO REPORT    Intraoperative interpretation by procedural physician at [place].    Standing Status:   Standing    Number of Occurrences:   1    Order Specific Question:   Reason for exam:    Answer:   Assistance in needle guidance and placement for  procedures requiring needle placement in or near specific anatomical locations not easily accessible without such assistance.   Medications ordered for procedure: Meds ordered this encounter  Medications  . iohexol (OMNIPAQUE) 180 MG/ML injection 10 mL    Must be Myelogram-compatible. If not available, you may substitute with  a water-soluble, non-ionic, hypoallergenic, myelogram-compatible radiological contrast medium.  Marland Kitchen lidocaine (XYLOCAINE) 2 % (with pres) injection 400 mg  . fentaNYL (SUBLIMAZE) injection 25-50 mcg    Make sure Narcan is available in the pyxis when using this medication. In the event of respiratory depression (RR< 8/min): Titrate NARCAN (naloxone) in increments of 0.1 to 0.2 mg IV at 2-3 minute intervals, until desired degree of reversal.  . ropivacaine (PF) 2 mg/mL (0.2%) (NAROPIN) injection 3 mL  . sodium chloride flush (NS) 0.9 % injection 2 mL  . dexamethasone (DECADRON) injection 10 mg   Medications administered: We administered iohexol, lidocaine, fentaNYL, ropivacaine (PF) 2 mg/mL (0.2%), sodium chloride flush, and dexamethasone.  See the medical record for exact dosing, route, and time of administration.  Follow-up plan:   Return in about 4 weeks (around 05/21/2020) for Post Procedure Evaluation, in person.    Recent Visits Date Type Provider Dept  04/17/20 Telemedicine Milinda Pointer, MD [facility]  03/07/20 Telemedicine Milinda Pointer, MD [facility]  02/02/20 Office Visit Milinda Pointer, MD [facility]  Showing recent visits within past 90 days and meeting all other requirements Today's Visits Date Type Provider Dept  04/23/20 Procedure visit Gillis Santa, MD [facility]  Showing today's visits and meeting all other requirements Future Appointments Date Type Provider Dept  06/13/20 Appointment Milinda Pointer, MD [facility]  Showing future appointments within next 90 days and meeting  all other requirements  Disposition: Discharge home  Discharge (Date  Time): 04/23/2020; 0937 hrs.   Primary Care Physician: McLean-Scocuzza, Nino Glow, MD Location: [facility] Note by: Gillis Santa, MD Date: 04/23/2020; Time: 12:08 PM  Disclaimer:  Medicine is not an exact science. The only guarantee in medicine is that nothing is guaranteed. It is important to note that the decision to proceed with this intervention was based on the information collected from the patient. The Data and conclusions were drawn from the patient's questionnaire, the interview, and the physical examination. Because the information was provided in large part by the patient, it cannot be guaranteed that it has not been purposely or unconsciously manipulated. Every effort has been made to obtain as much relevant data as possible for this evaluation. It is important to note that the conclusions that lead to this procedure are derived in large part from the available data. Always take into account that the treatment will also be dependent on availability of resources and existing treatment guidelines, considered by other Pain Management Practitioners as being common knowledge and practice, at the time of the intervention. For Medico-Legal purposes, it is also important to point out that variation in procedural techniques and pharmacological choices are the acceptable norm. The indications, contraindications, technique, and results of the above procedure should only be interpreted and judged by a Board-Certified Interventional Pain Specialist with extensive familiarity and expertise in the same exact procedure and technique.

## 2020-04-24 ENCOUNTER — Telehealth: Payer: Self-pay

## 2020-04-24 ENCOUNTER — Encounter: Payer: BC Managed Care – PPO | Admitting: Physical Therapy

## 2020-04-24 ENCOUNTER — Ambulatory Visit: Payer: BC Managed Care – PPO | Admitting: Physical Therapy

## 2020-04-24 NOTE — Telephone Encounter (Signed)
Post procedure phone call.  LM 

## 2020-04-26 ENCOUNTER — Encounter: Payer: BC Managed Care – PPO | Admitting: Physical Therapy

## 2020-05-01 ENCOUNTER — Encounter: Payer: BC Managed Care – PPO | Admitting: Physical Therapy

## 2020-05-02 ENCOUNTER — Encounter: Payer: Self-pay | Admitting: Physical Therapy

## 2020-05-02 DIAGNOSIS — M545 Low back pain, unspecified: Secondary | ICD-10-CM

## 2020-05-02 DIAGNOSIS — G8929 Other chronic pain: Secondary | ICD-10-CM

## 2020-05-02 DIAGNOSIS — M6281 Muscle weakness (generalized): Secondary | ICD-10-CM

## 2020-05-02 NOTE — Therapy (Signed)
[facility] [address] [place], [address] Phone: [phone]   Fax:  [phone]  Physical Therapy Treatment / No-Visit Discharge Summary Reporting period: 03/14/2020 - 05/02/2020  Patient Details  Name: Anita Gibbs MRN: [id number] Date of Birth: Aug 26, 1971 Referring Provider (PT): Milinda Pointer, MD   Encounter Date: 05/02/2020    Past Medical History:  Diagnosis Date  . Arthritis   . Carpal boss, right   . Carpal tunnel syndrome, bilateral   . Headache   . Hypertension   . Seasonal asthma 11/29/2018    Past Surgical History:  Procedure Laterality Date  . ANTERIOR CERVICAL DECOMP/DISCECTOMY FUSION N/A 07/01/2017   Procedure: ANTERIOR CERVICAL DECOMPRESSION/DISCECTOMY FUSION 3 LEVELS;  Surgeon: Meade Maw, MD;  Location: [facility] ORS;  Service: Neurosurgery;  Laterality: N/A;  . KNEE ARTHROSCOPY Right     There were no vitals filed for this visit.   Subjective Assessment - 05/02/20 1708    Subjective Patient has not returned to PT since her last visit ~ 3 weeks ago.    Pertinent History Patient is a 49 y.o. female who presents to outpatient physical therapy with a referral for medical diagnosis chronic bilateral low back pain without sciatica, lumbar facet joint syndrome, osteoarthritis of sacroiliac joint, somatic dysfunction of sacroiliac joint. This patient's chief complaints consist of low back pain and bilateral leg pain/paresthesia R > L with weakness especially at R knee, and neck pain with B UE paresthesia leading to the following functional deficits: difficulty walking, unable to run, bending, lifting, moving furniture, driving, grocery shopping, housework, work Paramedic. Usual activities take extra time and the pain and dysfunction decreases her quality of life. Relevant past medical history and comorbidities include allergies (hives and knots; foods trigger), HTN, reflux, hx of  gout, chronic pain syndrome, hx of R knee surgery, chronic neck pain with intermittent B UE paresthesia with hx of cervical spine surgery, episodes of feeling her whole body cannot move. Patient denies hx of cancer, stroke, seizures, lung problem, major cardiac events, diabetes, unexplained weight loss, changes in bowel or bladder problems, new onset stumbling. Does drop things. Chart review reveals Dr. Dossie Arbour provided facet blocks that resolved pt's pain 100% but was temporary. He planned to perform ablation that is indicated for longer term relief in the setting of successful facet blocks. However, insurance would not approve this without MRI first. Insurance requires PT prior to approval of MRI, leading to current referral for PT.    Limitations Sitting;Lifting;Standing;Walking;House hold activities;Other (comment)   work, lifting, bending, pushing, pulling   How long can you sit comfortably? can sit 2.5 hours (causes tolerable pain)    How long can you stand comfortably? 15-30 min before she must sit    How long can you walk comfortably? 1 hour    Diagnostic tests see chart    Patient Stated Goals "If PT can help with some of the pain, I am for it"    Pain Onset More than a month ago           OBJECTIVE Patient is not present for examination at this time. Please see previous documentation for latest objective data.     PT Short Term Goals - 04/11/20 1845      PT SHORT TERM GOAL #1   Title Be independent with initial home exercise program for self-management of symptoms.    Baseline initial HEP provided at IE (03/14/2020); limited participation due to pain (04/11/2020);  Time 2    Period Weeks    Status Partially Met    Target Date 03/29/20             PT Long Term Goals - 04/11/20 1846      PT LONG TERM GOAL #1   Title Be independent with a long-term home exercise program for self-management of symptoms.    Baseline Initial HEP provided HEP (03/14/2020);    Time 6    Period  Weeks    Status Not Met   TARGET DATE FOR ALL LONG TERM GOALS: 04/26/2020     PT LONG TERM GOAL #2   Title Demonstrate improved FOTO score to equal or greater than 63 by visit 11 to demonstrate improvement in overall condition and self-reported functional ability.    Baseline 50 (03/14/2020); 40 (04/11/2020);    Time 6    Period Weeks    Status Not Met      PT LONG TERM GOAL #3   Title Patient will demonstrate at least 75% lumbar AROM with no compensations or increase in pain in all planes except intermittent end range discomfort to allow patient to complete valued activities with less difficulty.    Baseline pain during movement and restricted range (03/14/2020); no improvement (04/11/2020);    Time 6    Period Weeks    Status New      PT LONG TERM GOAL #4   Title Be able to demonstrate floor to waist lift with 25 box with proper form without limitation due to current condition in order to improve ability to lift during work activities.    Baseline not tested (03/14/2020); see FCE result (04/11/2020);    Time 6    Period Weeks    Status Not Met      PT LONG TERM GOAL #5   Title Complete community, work and/or recreational activities without limitation due to current condition.    Baseline walking, unable to run, bending, lifting, moving furniture, driving, grocery shopping, housework, work activities. Usual activities take extra time (03/14/2020); patient reports overall worsening in her abilities since starting PT (04/11/2020);    Time 6    Period Weeks    Status Not Met             Plan - 05/02/20 1711    Clinical Impression Statement Patient attended 7 physical therapy sessions with no improvement in pain. Recommended hold of PT ~ 3 weeks ago and sent patient back to physician. Now discharging from PT due to lack of tolerance or progress.    Personal Factors and Comorbidities Age;Comorbidity 3+;Behavior Pattern;Past/Current Experience;Fitness;Social Background;Time since onset of  injury/illness/exacerbation;Education    Comorbidities Relevant past medical history and comorbidities include allergies (hives and knots; foods trigger), HTN, reflux, hx of gout, chronic pain syndrome, hx of R knee surgery, chronic neck pain with intermittent B UE paresthesia with hx of cervical spine surgery, episodes of feeling her whole body cannot move.    Examination-Activity Limitations Lift;Stand;Locomotion Level;Bend;Carry;Sit;Sleep;Squat;Stairs;Other    Examination-Participation Restrictions Yard Work;Cleaning;Meal Prep;Community Activity;Driving;Interpersonal Relationship;Laundry;Shop   walking, unable to run, bending, lifting, moving furniture, driving, grocery shopping, housework, work activities. Usual activities take extra time.   Stability/Clinical Decision Making Evolving/Moderate complexity    Rehab Potential Fair    PT Frequency 2x / week    PT Duration 6 weeks    PT Treatment/Interventions ADLs/Self Care Home Management;Aquatic Therapy;Cryotherapy;Electrical Stimulation;Moist Heat;Traction;Gait training;Stair training;Functional mobility training;Therapeutic activities;Therapeutic exercise;Balance training;Neuromuscular re-education;Patient/family education;Manual techniques;Dry needling;Spinal Manipulations;Joint Manipulations  PT Next Visit Plan Patient is now discharged from [facility] Access Code: [email or web address]    Consulted and Agree with Plan of Care Patient           Patient will benefit from skilled therapeutic intervention in order to improve the following deficits and impairments:  Increased fascial restricitons, Impaired sensation, Pain, Improper body mechanics, Decreased mobility, Increased muscle spasms, Impaired UE functional use, Impaired perceived functional ability, Hypomobility, Decreased strength, Decreased range of motion, Decreased endurance, Decreased activity tolerance, Difficulty walking, Impaired flexibility, Obesity  Visit  Diagnosis: Chronic bilateral low back pain, unspecified whether sciatica present  Muscle weakness (generalized)     Problem List Patient Active Problem List   Diagnosis Date Noted  . Lumbar radiculitis (L5 dermatome) (Right) 04/17/2020  . History of illicit drug use 07/86/7544  . Pain medication agreement broken 03/07/2020  . Multiple food allergies 03/07/2020  . Excessive daytime sleepiness 03/06/2020  . Seasonal allergies 03/06/2020  . Marijuana use 03/06/2020  . Abnormal drug screen 03/06/2020  . Multiple environmental allergies 01/17/2020  . Other intervertebral disc degeneration, lumbar region 11/15/2019  . Prediabetes 10/10/2019  . HLD (hyperlipidemia) 10/10/2019  . Gastroesophageal reflux disease 09/06/2019  . Fibroid uterus 09/06/2019  . Rash 08/08/2019  . Spondylosis without myelopathy or radiculopathy, lumbosacral region 05/19/2019  . Lumbar facet syndrome (Bilateral) (R>L) 04/20/2019  . Spondylosis without myelopathy or radiculopathy, cervical region 03/22/2019  . Seasonal allergic rhinitis due to pollen 11/29/2018  . Seasonal asthma 11/29/2018  . Neurogenic pain 10/27/2018  . Chronic musculoskeletal pain 10/27/2018  . History of allergy to shellfish 09/28/2018  . Chronic upper extremity pain (Secondary Area of Pain) (Bilateral) (R>L) 09/27/2018  . Cervicalgia (Primary Area of Pain) (Bilateral) (L>R) 09/27/2018  . History of fusion of cervical spine (ACDF C4-C7) 09/27/2018  . Abnormal MRI, cervical spine (06/10/2018) 09/27/2018  . 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 Cervical facet joint syndrome (Bilateral) (L>R) 09/27/2018  . Cervical facet hypertrophy (C3-T1) 09/27/2018  . 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 Chronic sacroiliac joint dysfunction (Bilateral) 09/27/2018  . 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  . Somatic dysfunction of sacroiliac joint (Bilateral)  09/27/2018  . DDD (degenerative disc disease), cervical 09/27/2018  . Chronic neck pain (Bilateral) w/ history of cervical spinal surgery 09/27/2018  . Spondylosis, cervical, w/ myelopathy 09/27/2018  . Cervical spondylosis 09/27/2018  . DDD (degenerative disc disease), lumbar 09/27/2018  . Vitamin D deficiency 09/27/2018  . Elevated sed rate 09/08/2018  . Chronic neck pain (Primary Area of Pain) (Bilateral) (L>R) 09/07/2018  . Chronic low back pain [facility] Area of Pain) (Bilateral) (R>L) w/ sciatica (Bilateral) 09/07/2018  . Chronic lower extremity pain (Fourth Area of Pain) (Bilateral) (R>L) 09/07/2018  . Chronic pain syndrome 09/07/2018  . Pharmacologic therapy 09/07/2018  . Disorder of skeletal system 09/07/2018  . Problems influencing health status 09/07/2018  . Chronic sacroiliac joint pain (Right) 09/07/2018  . Chronic low back pain [facility] Area of Pain) (Bilateral) (R>L) w/o sciatica 07/26/2018  . Essential hypertension 05/31/2018  . Chronic gout of foot (Left) 05/31/2018  . Numbness and tingling of both feet 05/31/2018  . Hyperkalemia 05/31/2018  . Cervical myelopathy ([place]) 07/01/2017    [name]. Graylon Good, PT, DPT 05/02/20, 5:11 PM  [facility] [address], [place], [address] Phone: [phone]   Fax:  [phone]  Name: Rayvn Rickerson  Ramnauth MRN: [id number] Date of Birth: 1971-05-01

## 2020-05-03 ENCOUNTER — Encounter: Payer: BC Managed Care – PPO | Admitting: Physical Therapy

## 2020-05-25 ENCOUNTER — Ambulatory Visit: Payer: BC Managed Care – PPO | Admitting: Internal Medicine

## 2020-05-30 ENCOUNTER — Other Ambulatory Visit: Payer: Self-pay

## 2020-05-30 ENCOUNTER — Encounter: Payer: Self-pay | Admitting: Internal Medicine

## 2020-05-30 ENCOUNTER — Telehealth (INDEPENDENT_AMBULATORY_CARE_PROVIDER_SITE_OTHER): Payer: BC Managed Care – PPO | Admitting: Internal Medicine

## 2020-05-30 DIAGNOSIS — R937 Abnormal findings on diagnostic imaging of other parts of musculoskeletal system: Secondary | ICD-10-CM

## 2020-05-30 DIAGNOSIS — I10 Essential (primary) hypertension: Secondary | ICD-10-CM

## 2020-05-30 DIAGNOSIS — N62 Hypertrophy of breast: Secondary | ICD-10-CM | POA: Diagnosis not present

## 2020-05-30 DIAGNOSIS — M47812 Spondylosis without myelopathy or radiculopathy, cervical region: Secondary | ICD-10-CM

## 2020-05-30 DIAGNOSIS — G8928 Other chronic postprocedural pain: Secondary | ICD-10-CM

## 2020-05-30 DIAGNOSIS — J301 Allergic rhinitis due to pollen: Secondary | ICD-10-CM | POA: Diagnosis not present

## 2020-05-30 DIAGNOSIS — M5442 Lumbago with sciatica, left side: Secondary | ICD-10-CM

## 2020-05-30 DIAGNOSIS — R7303 Prediabetes: Secondary | ICD-10-CM

## 2020-05-30 DIAGNOSIS — Z13818 Encounter for screening for other digestive system disorders: Secondary | ICD-10-CM

## 2020-05-30 DIAGNOSIS — G8929 Other chronic pain: Secondary | ICD-10-CM

## 2020-05-30 DIAGNOSIS — Z6841 Body Mass Index (BMI) 40.0 and over, adult: Secondary | ICD-10-CM

## 2020-05-30 DIAGNOSIS — M47817 Spondylosis without myelopathy or radiculopathy, lumbosacral region: Secondary | ICD-10-CM

## 2020-05-30 DIAGNOSIS — M47816 Spondylosis without myelopathy or radiculopathy, lumbar region: Secondary | ICD-10-CM

## 2020-05-30 DIAGNOSIS — M542 Cervicalgia: Secondary | ICD-10-CM

## 2020-05-30 DIAGNOSIS — M5441 Lumbago with sciatica, right side: Secondary | ICD-10-CM

## 2020-05-30 DIAGNOSIS — Z9889 Other specified postprocedural states: Secondary | ICD-10-CM

## 2020-05-30 HISTORY — DX: Hypertrophy of breast: N62

## 2020-05-30 MED ORDER — PHENTERMINE HCL 37.5 MG PO TABS: 37.5000 mg | ORAL_TABLET | Freq: Every day | ORAL | 0 refills | Status: DC

## 2020-05-30 NOTE — Progress Notes (Signed)
Virtual Visit via Video Note  I connected with Anita Gibbs  on 05/30/20 at 10:30 AM EDT by a video enabled telemedicine application and verified that I am speaking with the correct person using two identifiers.  Location patient: car Location provider:work or home office Persons participating in the virtual visit: patient, provider  I discussed the limitations of evaluation and management by telemedicine and the availability of in person appointments. The patient expressed understanding and agreed to proceed.   HPI: 1. Obesity and wants to discuss weight loss options and get back on adipex BP 121/76 controlled but advised to monitor  2.  Large breasts thinking about breast reduction and plastic surgery as c/o chronic neck and low back pain at times 07/04/20 consult with Dr. Claudia Desanctis Chronic pain (neck and low back) f/u with Dr. Holley Raring and upcoming appt 06/07/20 and she would like to switch to him for chronic pain management   3. Allergies improved and accupuncture piercing in ear has helped and eye sxs improved since last visit    ROS: See pertinent positives and negatives per HPI.  Past Medical History:  Diagnosis Date  . Arthritis   . Carpal boss, right   . Carpal tunnel syndrome, bilateral   . Headache   . Hypertension   . Seasonal asthma 11/29/2018    Past Surgical History:  Procedure Laterality Date  . ANTERIOR CERVICAL DECOMP/DISCECTOMY FUSION N/A 07/01/2017   Procedure: ANTERIOR CERVICAL DECOMPRESSION/DISCECTOMY FUSION 3 LEVELS;  Surgeon: Meade Maw, MD;  Location: [facility] ORS;  Service: Neurosurgery;  Laterality: N/A;  . KNEE ARTHROSCOPY Right      Current Outpatient Medications:  .  albuterol (PROVENTIL HFA;VENTOLIN HFA) 108 (90 Base) MCG/ACT inhaler, Inhale 2 puffs into the lungs every 6 (six) hours as needed for wheezing or shortness of breath., Disp: 1 Inhaler, Rfl: 5 .  amLODipine (NORVASC) 5 MG tablet, Take 1 tablet (5 mg total) by mouth daily., Disp: 90  tablet, Rfl: 3 .  atorvastatin (LIPITOR) 10 MG tablet, Take 1 tablet (10 mg total) by mouth daily at 6 PM., Disp: 90 tablet, Rfl: 3 .  Cetirizine HCl (ZYRTEC PO), Take 25 mg by mouth., Disp: , Rfl:  .  Cholecalciferol (VITAMIN D-3) 125 MCG (5000 UT) TABS, Take by mouth daily., Disp: , Rfl:  .  diphenhydrAMINE (BENADRYL) 25 mg capsule, Take 25 mg by mouth every 6 (six) hours as needed (for allergies.)., Disp: , Rfl:  .  EPINEPHrine (AUVI-Q) 0.3 mg/0.3 mL IJ SOAJ injection, Use as directed for severe allergic reaction, Disp: 2 Device, Rfl: 2 .  fluticasone (FLONASE) 50 MCG/ACT nasal spray, Place 2 sprays into both nostrils daily., Disp: 16 g, Rfl: 6 .  hydrochlorothiazide (HYDRODIURIL) 25 MG tablet, Take 0.5 tablets (12.5 mg total) by mouth daily. In am, Disp: 90 tablet, Rfl: 3 .  losartan (COZAAR) 100 MG tablet, Take 1 tablet (100 mg total) by mouth daily., Disp: 90 tablet, Rfl: 3 .  meloxicam (MOBIC) 15 MG tablet, Take 1 tablet (15 mg total) by mouth daily., Disp: 30 tablet, Rfl: 1 .  methocarbamol (ROBAXIN) 750 MG tablet, Take 1 tablet (750 mg total) by mouth every 8 (eight) hours as needed for muscle spasms., Disp: 90 tablet, Rfl: 5 .  norethindrone (AYGESTIN) 5 MG tablet, 2 (two) times daily. , Disp: , Rfl:  .  pantoprazole (PROTONIX) 40 MG tablet, Take 1 tablet (40 mg total) by mouth daily. 30 min. Before dinner, Disp: 90 tablet, Rfl: 3 .  hydrocortisone 2.5 % cream,  Apply topically 2 (two) times daily. Prn to eyelids, Disp: 30 g, Rfl: 0 .  montelukast (SINGULAIR) 10 MG tablet, Take 1 tablet (10 mg total) by mouth at bedtime., Disp: 90 tablet, Rfl: 3 .  Olopatadine HCl 0.2 % SOLN, Apply 1 drop to eye daily. Both eyes, Disp: 2.5 mL, Rfl: 11 .  phentermine (ADIPEX-P) 37.5 MG tablet, Take 1 tablet (37.5 mg total) by mouth daily before breakfast. rx 1/2, Disp: 60 tablet, Rfl: 0  EXAM: Vitals with BMI 06/18/2020 06/11/2020 06/07/2020  Height 5\' 2"  5\' 2"  5\' 2"   Weight 248 lbs 252 lbs 246 lbs  BMI  45.35 50.38 88.28  Systolic 003 491 791  Diastolic 76 80 67  Pulse 86 85 63   VITALS per patient if applicable:  GENERAL: alert, oriented, appears well and in no acute distress  HEENT: atraumatic, conjunttiva clear, no obvious abnormalities on inspection of external nose and ears  NECK: normal movements of the head and neck  LUNGS: on inspection no signs of respiratory distress, breathing rate appears normal, no obvious gross SOB, gasping or wheezing  CV: no obvious cyanosis  MS: moves all visible extremities without noticeable abnormality  PSYCH/NEURO: pleasant and cooperative, no obvious depression or anxiety, speech and thought processing grossly intact  ASSESSMENT AND PLAN:  Discussed the following assessment and plan:  Morbid obesity with BMI of 45.0-49.9, adult ([place]) - Plan: phentermine (ADIPEX-P) 37.5 MG tablet, DISCONTINUED: phentermine (ADIPEX-P) 37.5 MG tablet 4 months on and 3 months off  Monitor BP   Large breasts Consult with plastics 07/04/20 Dr. Claudia Desanctis   Seasonal allergic rhinitis due to pollen accupuncuture heled   Chronic neck and low back pain with large breasts with  Abnormal MRI, cervical spine Spondylosis without myelopathy or radiculopathy, lumbosacral region Lumbar facet syndrome (Bilateral) (R>L) Spondylosis without myelopathy or radiculopathy, cervical region Chronic neck pain (Bilateral) w/ history of cervical spinal surgery Chronic low back pain (Tertiary Area of Pain) (Bilateral) (R>L) w/ sciatica (Bilateral) -pt wants to switch to Dr. Holley Raring for chronic pain instead of Dr. Consuela Mimes sent message to Dr. Holley Raring   HM Flu shot due  Tdap utd  covid vaccine pfizer 2/2 consider booster  mammo1/29/21 normal   2021colonoscopy - GERD hyperplastic polyp f/u in 5 years  02/26/18 neg neg hpv h/o abnl h/o fibroids Dr.Song  rec healthy diet and exercise   -we discussed possible serious and likely etiologies, options for evaluation and workup,  limitations of telemedicine visit vs in person visit, treatment, treatment risks and precautions.    I discussed the assessment and treatment plan with the patient. The patient was provided an opportunity to ask questions and all were answered. The patient agreed with the plan and demonstrated an understanding of the instructions.    Time 20 minutes Delorise Jackson, MD

## 2020-05-31 ENCOUNTER — Other Ambulatory Visit: Payer: Self-pay | Admitting: Allergy and Immunology

## 2020-06-04 ENCOUNTER — Other Ambulatory Visit: Payer: Self-pay | Admitting: Internal Medicine

## 2020-06-04 MED ORDER — MONTELUKAST SODIUM 10 MG PO TABS
10.0000 mg | ORAL_TABLET | Freq: Every day | ORAL | 3 refills | Status: DC
Start: 2020-06-04 — End: 2021-03-04

## 2020-06-07 ENCOUNTER — Other Ambulatory Visit: Payer: Self-pay

## 2020-06-07 ENCOUNTER — Encounter: Payer: Self-pay | Admitting: Student in an Organized Health Care Education/Training Program

## 2020-06-07 ENCOUNTER — Ambulatory Visit
Payer: BC Managed Care – PPO | Attending: Pain Medicine | Admitting: Student in an Organized Health Care Education/Training Program

## 2020-06-07 VITALS — BP 116/67 | HR 63 | Temp 98.2°F | Resp 18 | Ht 62.0 in | Wt 246.0 lb

## 2020-06-07 DIAGNOSIS — M47816 Spondylosis without myelopathy or radiculopathy, lumbar region: Secondary | ICD-10-CM | POA: Insufficient documentation

## 2020-06-07 DIAGNOSIS — M47817 Spondylosis without myelopathy or radiculopathy, lumbosacral region: Secondary | ICD-10-CM

## 2020-06-07 DIAGNOSIS — M5441 Lumbago with sciatica, right side: Secondary | ICD-10-CM | POA: Diagnosis present

## 2020-06-07 DIAGNOSIS — M5442 Lumbago with sciatica, left side: Secondary | ICD-10-CM | POA: Insufficient documentation

## 2020-06-07 DIAGNOSIS — G8929 Other chronic pain: Secondary | ICD-10-CM

## 2020-06-07 DIAGNOSIS — G894 Chronic pain syndrome: Secondary | ICD-10-CM | POA: Insufficient documentation

## 2020-06-07 NOTE — Progress Notes (Signed)
PROVIDER NOTE: Information contained herein reflects review and annotations entered in association with encounter. Interpretation of such information and data should be left to medically-trained personnel. Information provided to patient can be located elsewhere in the medical record under "Patient Instructions". Document created using STT-dictation technology, any transcriptional errors that may result from process are unintentional.    Patient: Anita Gibbs  Service Category: E/M  Provider: Gillis Santa, MD  DOB: [birth year]  DOS: 06/07/2020  Specialty: Interventional Pain Management  MRN: [id number]  Setting: Ambulatory outpatient  PCP: McLean-Scocuzza, Nino Glow, MD  Type: Established Patient    Referring Provider: Orland Gibbs *  Location: Office  Delivery: Face-to-face     HPI  [name], a 49 y.o. year old female, is here today because of her Spondylosis without myelopathy or radiculopathy, lumbosacral region [M47.817]. Ms. Level primary complain today is Back Pain (low) and Leg Pain (right) Last encounter: [name] was on 04/23/2020. Pertinent problems: [name] does not have any pertinent problems on file. Pain Assessment: Severity of Chronic pain is reported as a 7 /10. Location: Back Lower/right leg below knee through to toes that go numb most of the time. Onset: More than a month ago. Quality: Pins and needles, Throbbing, Stabbing. Timing: Constant. Modifying factor(s): OTC patches, topicals, heat, ice, medications. Vitals:  height is _0  (1.575 m) and weight is 246 lb (111.6 kg). Her oral temperature is 98.2 F (36.8 C). Her blood pressure is 116/67 and her pulse is 63. Her respiration is 18 and oxygen saturation is 100%.   Reason for encounter: post-procedure assessment.    Post-Procedure Evaluation  Procedure (04/23/2020):  Type: Diagnostic Inter-Laminar Epidural Steroid Injection  #1  Region: Lumbar Level: L4-5 Level. Laterality:  Right-Sided         Sedation: Please see nurses note.  Effectiveness during initial hour after procedure(Ultra-Short Term Relief): 100 %   Local anesthetic used: Long-acting (4-6 hours) Effectiveness: Defined as any analgesic benefit obtained secondary to the administration of local anesthetics. This carries significant diagnostic value as to the etiological location, or anatomical origin, of the pain. Duration of benefit is expected to coincide with the duration of the local anesthetic used.  Effectiveness during initial 4-6 hours after procedure(Short-Term Relief): 100 %  Long-term benefit: Defined as any relief past the pharmacologic duration of the local anesthetics.  Effectiveness past the initial 6 hours after procedure(Long-Term Relief): 30 %   Current benefits: Defined as benefit that persist at this time.   Analgesia:  <50% better Function: [name] reports improvement in function ROM: [name] reports improvement in ROM    ROS  Constitutional: Denies any fever or chills Gastrointestinal: No reported hemesis, hematochezia, vomiting, or acute GI distress Musculoskeletal: Low back pain Neurological: No reported episodes of acute onset apraxia, aphasia, dysarthria, agnosia, amnesia, paralysis, loss of coordination, or loss of consciousness  Medication Review  Cetirizine HCl, EPINEPHrine, Olopatadine HCl, Vitamin D-3, albuterol, amLODipine, atorvastatin, diphenhydrAMINE, erythromycin, fluticasone, gabapentin, hydrochlorothiazide, hydrocortisone, losartan, meloxicam, methocarbamol, montelukast, norethindrone, pantoprazole, and phentermine  History Review  Allergy: [name] is allergic to other, shellfish allergy, strawberry (diagnostic), and tomato. Drug: [name]  reports no history of drug use. Alcohol:  reports current alcohol use. Tobacco:  reports that she has never smoked. She has never used smokeless tobacco. Social: [name]  reports that she has never  smoked. She has never used smokeless tobacco. She reports current alcohol use. She reports that she does not use  drugs. Medical:  has a past medical history of Arthritis, Carpal boss, right, Carpal tunnel syndrome, bilateral, Headache, Hypertension, and Seasonal asthma (11/29/2018). Surgical: [name]  has a past surgical history that includes Knee arthroscopy (Right) and Anterior cervical decomp/discectomy fusion (N/A, 07/01/2017). Family: family history includes [name] in her mother; Arthritis in her father, mother, and sister; Breast cancer in her maternal aunt and paternal aunt; COPD in her brother and son; Cancer in her brother; Depression in her son; Diabetes in her mother; Early death in her brother; Hearing loss in her father; Hyperlipidemia in her mother; Hypertension in her father, mother, and sister; Kidney disease in her father; Lymphoma in her father; Sleep apnea in her daughter.  Laboratory Chemistry Profile   Renal Lab Results  Component Value Date   BUN 17 02/14/2020   CREATININE 1.05 02/14/2020   BCR 13 09/07/2018   GFR 68.09 02/14/2020   GFRAA 76 09/07/2018   GFRNONAA 66 09/07/2018     Hepatic Lab Results  Component Value Date   AST 14 02/14/2020   ALT 17 02/14/2020   ALBUMIN 3.9 02/14/2020   ALKPHOS 36 (L) 02/14/2020     Electrolytes Lab Results  Component Value Date   NA 136 02/14/2020   K 4.1 02/14/2020   CL 104 02/14/2020   CALCIUM 9.1 02/14/2020   MG 2.2 09/07/2018     Bone Lab Results  Component Value Date   VD25OH 34.98 02/14/2020   25OHVITD1 8.1 (L) 09/07/2018   25OHVITD2 <1.0 09/07/2018   25OHVITD3 8.1 09/07/2018     Inflammation (CRP: Acute Phase) (ESR: Chronic Phase) Lab Results  Component Value Date   CRP 7 09/07/2018   ESRSEDRATE 67 (H) 09/07/2018       Note: Above Lab results reviewed.   Physical Exam  General appearance: Well nourished, well developed, and well hydrated. In no apparent acute distress Mental status: Alert,  oriented x 3 (person, place, & time)       Respiratory: No evidence of acute respiratory distress Eyes: PERLA Vitals: BP 116/67   Pulse 63   Temp 98.2 F (36.8 C) (Oral)   Resp 18   Ht _0  (1.575 m)   Wt 246 lb (111.6 kg)   SpO2 100%   BMI 44.99 kg/m  BMI: Estimated body mass index is 44.99 kg/m as calculated from the following:   Height as of this encounter: _1  (1.575 m).   Weight as of this encounter: 246 lb (111.6 kg). Ideal: Ideal body weight: 50.1 kg (110 lb 7.2 oz) Adjusted ideal body weight: 74.7 kg (164 lb 10.7 oz)   Lumbar Spine Area Exam  Skin & Axial Inspection: No masses, redness, or swelling Alignment: Symmetrical Functional ROM: Unrestricted ROM       Stability: No instability detected Muscle Tone/Strength: Functionally intact. No obvious neuro-muscular anomalies detected. Sensory (Neurological): Musculoskeletal pain pattern Palpation: No palpable anomalies       Provocative Tests: Hyperextension/rotation test: (+) bilaterally for facet joint pain. Lumbar quadrant test (Kemp's test): (+) bilaterally for facet joint pain. Lateral bending test: deferred today       Patrick's Maneuver: deferred today                   FABER* test: deferred today                   S-I anterior distraction/compression test: deferred today         S-I lateral compression test: deferred today  S-I Thigh-thrust test: deferred today         S-I Gaenslen's test: deferred today         *(Flexion, ABduction and External Rotation) Gait & Posture Assessment  Ambulation: Unassisted Gait: Relatively normal for age and body habitus Posture: WNL  Lower Extremity Exam    Side: Right lower extremity  Side: Left lower extremity  Stability: No instability observed          Stability: No instability observed          Skin & Extremity Inspection: Skin color, temperature, and hair growth are WNL. No peripheral edema or cyanosis. No masses, redness, swelling, asymmetry, or associated  skin lesions. No contractures.  Skin & Extremity Inspection: Skin color, temperature, and hair growth are WNL. No peripheral edema or cyanosis. No masses, redness, swelling, asymmetry, or associated skin lesions. No contractures.  Functional ROM: Improved after treatment                  Functional ROM: Unrestricted ROM                  Muscle Tone/Strength: Functionally intact. No obvious neuro-muscular anomalies detected.  Muscle Tone/Strength: Functionally intact. No obvious neuro-muscular anomalies detected.  Sensory (Neurological): Arthropathic arthralgia        Sensory (Neurological): Unimpaired        DTR: Patellar: deferred today Achilles: deferred today Plantar: deferred today  DTR: Patellar: deferred today Achilles: deferred today Plantar: deferred today  Palpation: No palpable anomalies  Palpation: No palpable anomalies    Assessment   Status Diagnosis  Worsening Worsening Worsening 1. Spondylosis without myelopathy or radiculopathy, lumbosacral region   2. Lumbar facet arthropathy   3. Lumbar facet syndrome (Bilateral) (R>L)   4. Chronic low back pain [facility] Pain) (Bilateral) (R>L) w/ sciatica (Bilateral)   5. Chronic pain syndrome       Plan of Care    Kaly follows up today for postprocedural evaluation after right L4 and L5 lumbar epidural steroid injection.  She states that the injection was helpful in helping to decrease her pain radiating into her right leg.  She states that she is able to bear weight and less pain and has less pain with ADLs.  She is describing more focal low back pain that is worse with lumbar facet loading.  Of note she has had diagnostic lumbar facet medial branch nerve blocks in the past which provided her with greater than 50% pain relief for at least 5 days.  An order was placed for a lumbar radiofrequency ablation however it was denied by insurance even though the patient has evidence of significant facet arthropathy that was  responsive to diagnostic lumbar facet medial branch nerve blocks.  Of note patient has completed physical therapy.  I presented the patient with 2 options which would be to try and resubmit for lumbar radiofrequency ablation now that she has completed physical therapy but continues to have persistent low back pain with facet loading in the context of having positive diagnostic lumbar facet medial branch nerve blocks.  Another option that I discussed with her was lumbar facet medial branch peripheral nerve stimulation of L4 bilaterally.  I provided the patient with resources regarding this and spent time discussing the risks and benefits of this procedure.  Patient opted for the Sprint peripheral nerve stimulation procedure for her low back pain.  We will plan for right L4 medial branch peripheral nerve stimulation followed 2 weeks by the  left.  Orders:  Orders Placed This Encounter  Procedures  . Peripheral Nerve Stimulation    Standing Status:   Future    Standing Expiration Date:   06/07/2021    Scheduling Instructions:     SPRINT PNS lumbar medial branch     Right L4    Order Specific Question:   Where will this procedure be performed?    Answer:   [facility]  . Peripheral Nerve Stimulation    Standing Status:   Future    Standing Expiration Date:   06/07/2021    Scheduling Instructions:     Left medial branch L4 peripheral nerve stimulation    Order Specific Question:   Where will this procedure be performed?    Answer:   [facility]   Follow-up plan:   Return in about 2 weeks (around 06/21/2020) for Right Sprint PNS L4 medial branch Po Valium.   Recent Visits Date Type Provider Dept  04/23/20 Procedure visit Anita Santa, MD [facility]  04/17/20 Telemedicine Milinda Pointer, MD [facility]  Showing recent visits within past 90 days and meeting all other requirements Today's Visits Date Type Provider Dept  06/07/20 Office Visit Anita Santa, MD [facility]  Showing today's visits and meeting all other requirements Future Appointments No visits were found meeting these conditions. Showing future appointments within next 90 days and meeting all other requirements  I discussed the assessment and treatment plan with the patient. The patient was provided an opportunity to ask questions and all were answered. The patient agreed with the plan and demonstrated an understanding of the instructions.  Patient advised to call back or seek an in-person evaluation if the symptoms or condition worsens.  Duration of encounter: 30 minutes.  Note by: Anita Santa, MD Date: 06/07/2020; Time: 1:01 PM

## 2020-06-07 NOTE — Progress Notes (Signed)
Safety precautions to be maintained throughout the outpatient stay will include: orient to surroundings, keep bed in low position, maintain call bell within reach at all times, provide assistance with transfer out of bed and ambulation.   MRI performed 05/02/2020: L3-L4: Moderate left lateral, mild right lateral disc bulging with moderate right facet arthrosis and mild right facet hypertrophy.  Mild left neuroforaminal narrowing. L4-L5: Mild right lateral disc bulging with mild bilateral facet arthrosis without neural foraminal narrowing. L5-S1: Moderate right lateral disc bulging disc osteophyte complex with moderate right facet arthrosis and facet hypertrophy.  Mild right neuroforaminal narrowing.: Impression: Mild left L3-L4 neuroforaminal narrowing, moderate right facet arthrosis. 2.  Mild right L5-S1 neuroforaminal narrowing.  Moderate right facet arthrosis.

## 2020-06-11 ENCOUNTER — Telehealth: Payer: Self-pay | Admitting: Nurse Practitioner

## 2020-06-11 ENCOUNTER — Other Ambulatory Visit: Payer: Self-pay

## 2020-06-11 ENCOUNTER — Other Ambulatory Visit (HOSPITAL_COMMUNITY)
Admission: RE | Admit: 2020-06-11 | Discharge: 2020-06-11 | Disposition: A | Discharge status: Home / Self Care | Payer: BC Managed Care – PPO | Source: Ambulatory Visit | Attending: Nurse Practitioner | Admitting: Nurse Practitioner

## 2020-06-11 ENCOUNTER — Ambulatory Visit (INDEPENDENT_AMBULATORY_CARE_PROVIDER_SITE_OTHER): Payer: BC Managed Care – PPO | Admitting: Nurse Practitioner

## 2020-06-11 ENCOUNTER — Encounter: Payer: Self-pay | Admitting: Nurse Practitioner

## 2020-06-11 VITALS — BP 122/80 | HR 85 | Temp 98.5°F | Ht 62.0 in | Wt 252.0 lb

## 2020-06-11 DIAGNOSIS — R109 Unspecified abdominal pain: Secondary | ICD-10-CM | POA: Insufficient documentation

## 2020-06-11 DIAGNOSIS — N760 Acute vaginitis: Secondary | ICD-10-CM | POA: Insufficient documentation

## 2020-06-11 DIAGNOSIS — B373 Candidiasis of vulva and vagina: Secondary | ICD-10-CM | POA: Insufficient documentation

## 2020-06-11 DIAGNOSIS — N898 Other specified noninflammatory disorders of vagina: Secondary | ICD-10-CM

## 2020-06-11 DIAGNOSIS — E278 Other specified disorders of adrenal gland: Secondary | ICD-10-CM

## 2020-06-11 DIAGNOSIS — B9689 Other specified bacterial agents as the cause of diseases classified elsewhere: Secondary | ICD-10-CM | POA: Insufficient documentation

## 2020-06-11 DIAGNOSIS — R1031 Right lower quadrant pain: Secondary | ICD-10-CM | POA: Diagnosis not present

## 2020-06-11 HISTORY — DX: Unspecified abdominal pain: R10.9

## 2020-06-11 HISTORY — DX: Acute vaginitis: N76.0

## 2020-06-11 HISTORY — DX: Other specified noninflammatory disorders of vagina: N89.8

## 2020-06-11 LAB — COMPREHENSIVE METABOLIC PANEL
ALT: 29 U/L (ref 0–35)
AST: 18 U/L (ref 0–37)
Albumin: 4.5 g/dL (ref 3.5–5.2)
Alkaline Phosphatase: 45 U/L (ref 39–117)
BUN: 14 mg/dL (ref 6–23)
CO2: 28 mEq/L (ref 19–32)
Calcium: 10.1 mg/dL (ref 8.4–10.5)
Chloride: 102 mEq/L (ref 96–112)
Creatinine, Ser: 1.23 mg/dL — ABNORMAL HIGH (ref 0.40–1.20)
GFR: 52.22 mL/min — ABNORMAL LOW (ref >60.00)
Glucose, Bld: 80 mg/dL (ref 70–99)
Potassium: 3.9 mEq/L (ref 3.5–5.1)
Sodium: 139 mEq/L (ref 135–145)
Total Bilirubin: 0.4 mg/dL (ref 0.2–1.2)
Total Protein: 8.2 g/dL (ref 6.0–8.3)

## 2020-06-11 LAB — CBC WITH DIFFERENTIAL/PLATELET
Basophils Absolute: 0 10*3/uL (ref 0.0–0.1)
Basophils Relative: 0.4 % (ref 0.0–3.0)
Eosinophils Absolute: 0 10*3/uL (ref 0.0–0.7)
Eosinophils Relative: 0.4 % (ref 0.0–5.0)
HCT: 39 % (ref 36.0–46.0)
Hemoglobin: 13 g/dL (ref 12.0–15.0)
Lymphocytes Relative: 30.3 % (ref 12.0–46.0)
Lymphs Abs: 2.2 10*3/uL (ref 0.7–4.0)
MCHC: 33.4 g/dL (ref 30.0–36.0)
MCV: 100.3 fl — ABNORMAL HIGH (ref 78.0–100.0)
Monocytes Absolute: 0.7 10*3/uL (ref 0.1–1.0)
Monocytes Relative: 8.9 % (ref 3.0–12.0)
Neutro Abs: 4.4 10*3/uL (ref 1.4–7.7)
Neutrophils Relative %: 60 % (ref 43.0–77.0)
Platelets: 353 10*3/uL (ref 150.0–400.0)
RBC: 3.89 Mil/uL (ref 3.87–5.11)
RDW: 14.4 % (ref 11.5–15.5)
WBC: 7.4 10*3/uL (ref 4.0–10.5)

## 2020-06-11 LAB — POCT URINE PREGNANCY: Preg Test, Ur: NEGATIVE

## 2020-06-11 MED ORDER — METRONIDAZOLE 500 MG PO TABS: 500.0000 mg | ORAL_TABLET | Freq: Three times a day (TID) | ORAL | 0 refills | Status: DC

## 2020-06-11 MED ORDER — SACCHAROMYCES BOULARDII 250 MG PO CAPS: 250.0000 mg | ORAL_CAPSULE | Freq: Two times a day (BID) | ORAL | 0 refills | Status: AC

## 2020-06-11 MED ORDER — CIPROFLOXACIN HCL 500 MG PO TABS: 500.0000 mg | ORAL_TABLET | Freq: Two times a day (BID) | ORAL | 0 refills | Status: AC

## 2020-06-11 NOTE — Progress Notes (Addendum)
Established Patient Office Visit  Subjective:  Patient ID: Anita Gibbs, female    DOB: 11-02-1970  Age: 49 y.o. MRN: [id number]  CC:  Chief Complaint  Patient presents with  . Acute Visit    vaginal discharge    HPI Anita Gibbs presents for vaginal discharge that started on Thursday.  No itching or burning.  The discharge color is white to yellow.  No odor.  She has had the same sex partner for 1 year.  She does not use condoms.  She does not use birth control but does not think she could be pregnant. Point-of-care pregnancy today is negative.  Patient also has developed on Saturday right lower abdomen pain.  It hurts with bending, walking and twisting. Intact appendix. No dysuria, blood in the urine or hx of kidney stones.  She has normal bowel movements formed, no diarrhea.  No nausea, vomiting, diarrhea, fevers or chills.  She has never had abdominal pain like this before.She rates RLQ abd pain as 8 out of 10.   She denies any dysuria urgency, frequency, or burning.  Past Medical History:  Diagnosis Date  . Arthritis   . Carpal boss, right   . Carpal tunnel syndrome, bilateral   . Headache   . Hypertension   . Seasonal asthma 11/29/2018    Past Surgical History:  Procedure Laterality Date  . ANTERIOR CERVICAL DECOMP/DISCECTOMY FUSION N/A 07/01/2017   Procedure: ANTERIOR CERVICAL DECOMPRESSION/DISCECTOMY FUSION 3 LEVELS;  Surgeon: Meade Maw, MD;  Location: [facility] ORS;  Service: Neurosurgery;  Laterality: N/A;  . KNEE ARTHROSCOPY Right     Family History  Problem Relation Age of Onset  . [name] Mother   . Diabetes Mother   . Arthritis Mother   . Hypertension Mother   . Hyperlipidemia Mother   . Hypertension Father   . Arthritis Father   . Hearing loss Father   . Kidney disease Father   . Lymphoma Father   . Arthritis Sister   . Hypertension Sister   . Cancer Brother        ? lung due to exposure   . COPD Brother   . Early death Brother   . COPD Son    . Depression Son   . Breast cancer Maternal Aunt   . Breast cancer Paternal Aunt   . Sleep apnea Daughter   . Allergic rhinitis Neg Hx   . Angioedema Neg Hx   . Eczema Neg Hx   . Urticaria Neg Hx   . Asthma Neg Hx     Social History   Socioeconomic History  . Marital status: Widowed    Spouse name: Not on file  . Number of children: Not on file  . Years of education: Not on file  . Highest education level: Not on file  Occupational History  . Not on file  Tobacco Use  . Smoking status: Never Smoker  . Smokeless tobacco: Never Used  Vaping Use  . Vaping Use: Never used  Substance and Sexual Activity  . Alcohol use: Yes    Comment: occ  . Drug use: No  . Sexual activity: Yes    Birth control/protection: None  Other Topics Concern  . Not on file  Social History Narrative   Bus driver    And school custodian    1 daughter lives in [place] 51 y.o as of 01/17/20   1 son 37 almost 16 in [place]  Financial Resource Strain:   . Difficulty of Paying Living Expenses: Not on file  Food Insecurity:   . Worried About Charity fundraiser in the Last Year: Not on file  . Ran Out of Food in the Last Year: Not on file  Transportation Needs:   . Lack of Transportation (Medical): Not on file  . Lack of Transportation (Non-Medical): Not on file  Physical Activity:   . Days of Exercise per Week: Not on file  . Minutes of Exercise per Session: Not on file  Stress:   . Feeling of Stress : Not on file  Social Connections:   . Frequency of Communication with Friends and Family: Not on file  . Frequency of Social Gatherings with Friends and Family: Not on file  . Attends Religious Services: Not on file  . Active Member of Clubs or Organizations: Not on file  . Attends Archivist Meetings: Not on file  . Marital Status: Not on file  Intimate Partner Violence:   . Fear of Current or Ex-Partner: Not on file  . Emotionally Abused:  Not on file  . Physically Abused: Not on file  . Sexually Abused: Not on file    Outpatient Medications Prior to Visit  Medication Sig Dispense Refill  . albuterol (PROVENTIL HFA;VENTOLIN HFA) 108 (90 Base) MCG/ACT inhaler Inhale 2 puffs into the lungs every 6 (six) hours as needed for wheezing or shortness of breath. 1 Inhaler 5  . amLODipine (NORVASC) 5 MG tablet Take 1 tablet (5 mg total) by mouth daily. 90 tablet 3  . atorvastatin (LIPITOR) 10 MG tablet Take 1 tablet (10 mg total) by mouth daily at 6 PM. 90 tablet 3  . Cetirizine HCl (ZYRTEC PO) Take 25 mg by mouth.    . Cholecalciferol (VITAMIN D-3) 125 MCG (5000 UT) TABS Take by mouth daily.    . diphenhydrAMINE (BENADRYL) 25 mg capsule Take 25 mg by mouth every 6 (six) hours as needed (for allergies.).    Marland Kitchen EPINEPHrine (AUVI-Q) 0.3 mg/0.3 mL IJ SOAJ injection Use as directed for severe allergic reaction 2 Device 2  . erythromycin ophthalmic ointment Place 1 application into both eyes 3 (three) times daily. X 4-7 days 3.5 g 0  . fluticasone (FLONASE) 50 MCG/ACT nasal spray Place 2 sprays into both nostrils daily. 16 g 6  . hydrochlorothiazide (HYDRODIURIL) 25 MG tablet Take 0.5 tablets (12.5 mg total) by mouth daily. In am 90 tablet 3  . hydrocortisone 2.5 % cream Apply topically 2 (two) times daily. Prn to eyelids 30 g 0  . losartan (COZAAR) 100 MG tablet Take 1 tablet (100 mg total) by mouth daily. 90 tablet 3  . meloxicam (MOBIC) 15 MG tablet Take 1 tablet (15 mg total) by mouth daily. 30 tablet 1  . montelukast (SINGULAIR) 10 MG tablet Take 1 tablet (10 mg total) by mouth at bedtime. 90 tablet 3  . norethindrone (AYGESTIN) 5 MG tablet 2 (two) times daily.     . Olopatadine HCl 0.2 % SOLN Apply 1 drop to eye daily. Both eyes 2.5 mL 11  . pantoprazole (PROTONIX) 40 MG tablet Take 1 tablet (40 mg total) by mouth daily. 30 min. Before dinner 90 tablet 3  . phentermine (ADIPEX-P) 37.5 MG tablet Take 1 tablet (37.5 mg total) by mouth  daily before breakfast. rx 1/2 60 tablet 0  . phentermine (ADIPEX-P) 37.5 MG tablet Take 1 tablet (37.5 mg total) by mouth daily before breakfast. 60 tablet 0  .  gabapentin (NEURONTIN) 300 MG capsule Take 1-2 capsules (300-600 mg total) by mouth 3 (three) times daily. 180 capsule 5  . methocarbamol (ROBAXIN) 750 MG tablet Take 1 tablet (750 mg total) by mouth every 8 (eight) hours as needed for muscle spasms. 90 tablet 5   No facility-administered medications prior to visit.    Allergies  Allergen Reactions  . Other Hives    White and red sauces  . Shellfish Allergy Hives  . Strawberry (Diagnostic) Hives  . Tomato Hives    cherry    Review of Systems See history of present illness for pertinent review of systems otherwise negative.   Objective:    Physical Exam Vitals reviewed. Exam conducted with a chaperone present.  Constitutional:      General: She is not in acute distress.    Appearance: She is obese. She is not ill-appearing.  HENT:     Head: Normocephalic.  Cardiovascular:     Rate and Rhythm: Normal rate and regular rhythm.     Pulses: Normal pulses.     Heart sounds: Normal heart sounds.  Pulmonary:     Effort: Pulmonary effort is normal.     Breath sounds: Normal breath sounds.  Abdominal:     General: There is no distension.     Palpations: Abdomen is soft. There is no mass.     Tenderness: There is no abdominal tenderness. There is no guarding or rebound.  Genitourinary:    Vagina: Vaginal discharge present. No erythema or bleeding.     Cervix: No friability or cervical bleeding.     Uterus: Normal. Not tender.      Adnexa: Left adnexa normal.       Right: Tenderness present.      Comments: Hx of uterine fibroids.  Musculoskeletal:        General: Normal range of motion.     Cervical back: Normal range of motion and neck supple.  Skin:    General: Skin is warm and dry.  Neurological:     General: No focal deficit present.     Mental Status: She is  alert and oriented to person, place, and time.  Psychiatric:        Mood and Affect: Mood normal.        Behavior: Behavior normal.     BP 122/80 (BP Location: Left Arm, Patient Position: Sitting, Cuff Size: Normal)   Pulse 85   Temp 98.5 F (36.9 C) (Oral)   Ht 5\' 2"  (1.575 m)   Wt 252 lb (114.3 kg)   SpO2 99%   BMI 46.09 kg/m  Wt Readings from Last 3 Encounters:  06/11/20 252 lb (114.3 kg)  06/07/20 246 lb (111.6 kg)  05/30/20 256 lb (116.1 kg)   Pulse Readings from Last 3 Encounters:  06/11/20 85  06/07/20 63  04/23/20 64    BP Readings from Last 3 Encounters:  06/11/20 122/80  06/07/20 116/67  04/23/20 (!) 112/59    Lab Results  Component Value Date   CHOL 156 02/14/2020   HDL 50.10 02/14/2020   LDLCALC 94 02/14/2020   TRIG 62.0 02/14/2020   CHOLHDL 3 02/14/2020      Health Maintenance Due  Topic Date Due  . Hepatitis C Screening  Never done  . INFLUENZA VACCINE  04/01/2020    There are no preventive care reminders to display for this patient.  Lab Results  Component Value Date   TSH 3.33 10/10/2019   Lab Results  Component  Value Date   WBC 6.8 10/10/2019   HGB 13.3 10/10/2019   HCT 40.2 10/10/2019   MCV 102.5 (H) 10/10/2019   PLT 371.0 10/10/2019   Lab Results  Component Value Date   NA 136 02/14/2020   K 4.1 02/14/2020   CO2 27 02/14/2020   GLUCOSE 95 02/14/2020   BUN 17 02/14/2020   CREATININE 1.05 02/14/2020   BILITOT 0.4 02/14/2020   ALKPHOS 36 (L) 02/14/2020   AST 14 02/14/2020   ALT 17 02/14/2020   PROT 6.9 02/14/2020   ALBUMIN 3.9 02/14/2020   CALCIUM 9.1 02/14/2020   ANIONGAP 10 04/28/2018   GFR 68.09 02/14/2020   Lab Results  Component Value Date   CHOL 156 02/14/2020   Lab Results  Component Value Date   HDL 50.10 02/14/2020   Lab Results  Component Value Date   LDLCALC 94 02/14/2020   Lab Results  Component Value Date   TRIG 62.0 02/14/2020   Lab Results  Component Value Date   CHOLHDL 3 02/14/2020    Lab Results  Component Value Date   HGBA1C 6.2 02/14/2020      Assessment & Plan:   Problem List Items Addressed This Visit      Genitourinary   Acute vaginitis   Relevant Orders   RPR   HIV Antibody (routine testing w rflx)   Cytology - PAP( [place])   Cervicovaginal ancillary only( [place])     Other   RLQ abdominal pain - Primary   Relevant Orders   CT Abdomen Pelvis W Contrast   CBC with Differential/Platelet   Comprehensive metabolic panel   POCT urine pregnancy   Urine Culture      No orders of the defined types were placed in this encounter. 49 year old patient with a history of vaginal discharge that started Thursday.  The vaginal exam is unremarkable except for moderate white discharge.  STD testing performed.  Patient wanted HIV and RPR testing as well.  She says she gets this every year as well.   She does mention new onset Saturday of right lower quadrant pain that is out of the ordinary for her.  She is very tender to the exam, no rebound or guarding. This is in the McBurney's point- to suggest appendicitis.  She has had no nausea or vomiting or fevers.  She is working today.  She is eating a regular diet today.  She has not had lunch yet.  This could be a very early appendicitis, ovarian in nature, or diverticular.Doubt kidney stone as no flank pain.   Plan to obtain stat CBC, metc, urine preg is NEG and CT abdomen pelvis today  for definitive diagnosis.  She does not appear toxic and does not require ED transferat this time.  Please go to the lab today.   I have ordered vaginal culture testing as requested.   For the right lower abdominal pain, I have ordered a CT scan.  I will call with results.  This visit occurred during the SARS-CoV-2 public health emergency.  Safety protocols were in place, including screening questions prior to the visit, additional usage of staff PPE, and extensive cleaning of exam room while observing appropriate contact time  as indicated for disinfecting solutions.   Denice Paradise, NP

## 2020-06-11 NOTE — Patient Instructions (Addendum)
Please go to the lab today.   I have ordered vaginal culture testing as requested.   For the right lower abdominal pain, I have ordered a CT scan.  I will call with results.     Vaginitis Vaginitis is a condition in which the vaginal tissue swells and becomes red (inflamed). This condition is most often caused by a change in the normal balance of bacteria and yeast that live in the vagina. This change causes an overgrowth of certain bacteria or yeast, which causes the inflammation. There are different types of vaginitis, but the most common types are:  Bacterial vaginosis.  Yeast infection (candidiasis).  Trichomoniasis vaginitis. This is a sexually transmitted disease (STD).  Viral vaginitis.  Atrophic vaginitis.  Allergic vaginitis. What are the causes? The cause of this condition depends on the type of vaginitis. It can be caused by:  Bacteria (bacterial vaginosis).  Yeast, which is a fungus (yeast infection).  A parasite (trichomoniasis vaginitis).  A virus (viral vaginitis).  Low hormone levels (atrophic vaginitis). Low hormone levels can occur during pregnancy, breastfeeding, or after menopause.  Irritants, such as bubble baths, scented tampons, and feminine sprays (allergic vaginitis). Other factors can change the normal balance of the yeast and bacteria that live in the vagina. These include:  Antibiotic medicines.  Poor hygiene.  Diaphragms, vaginal sponges, spermicides, birth control pills, and intrauterine devices (IUD).  Sex.  Infection.  Uncontrolled diabetes.  A weakened defense (immune) system. What increases the risk? This condition is more likely to develop in women who:  Smoke.  Use vaginal douches, scented tampons, or scented sanitary pads.  Wear tight-fitting pants.  Wear thong underwear.  Use oral birth control pills or an IUD.  Have sex without a condom.  Have multiple sex partners.  Have an STD.  Frequently use the  spermicide nonoxynol-9.  Eat lots of foods high in sugar.  Have uncontrolled diabetes.  Have low estrogen levels.  Have a weakened immune system from an immune disorder or medical treatment.  Are pregnant or breastfeeding. What are the signs or symptoms? Symptoms vary depending on the cause of the vaginitis. Common symptoms include:  Abnormal vaginal discharge. ? The discharge is white, gray, or yellow with bacterial vaginosis. ? The discharge is thick, white, and cheesy with a yeast infection. ? The discharge is frothy and yellow or greenish with trichomoniasis.  A bad vaginal smell. The smell is fishy with bacterial vaginosis.  Vaginal itching, pain, or swelling.  Sex that is painful.  Pain or burning when urinating. Sometimes there are no symptoms. How is this diagnosed? This condition is diagnosed based on your symptoms and medical history. A physical exam, including a pelvic exam, will also be done. You may also have other tests, including:  Tests to determine the pH level (acidity or alkalinity) of your vagina.  A whiff test, to assess the odor that results when a sample of your vaginal discharge is mixed with a potassium hydroxide solution.  Tests of vaginal fluid. A sample will be examined under a microscope. How is this treated? Treatment varies depending on the type of vaginitis you have. Your treatment may include:  Antibiotic creams or pills to treat bacterial vaginosis and trichomoniasis.  Antifungal medicines, such as vaginal creams or suppositories, to treat a yeast infection.  Medicine to ease discomfort if you have viral vaginitis. Your sexual partner should also be treated.  Estrogen delivered in a cream, pill, suppository, or vaginal ring to treat atrophic vaginitis. If  vaginal dryness occurs, lubricants and moisturizing creams may help. You may need to avoid scented soaps, sprays, or douches.  Stopping use of a product that is causing allergic  vaginitis. Then using a vaginal cream to treat the symptoms. Follow these instructions at home: Lifestyle  Keep your genital area clean and dry. Avoid soap, and only rinse the area with water.  Do not douche or use tampons until your health care provider says it is okay to do so. Use sanitary pads, if needed.  Do not have sex until your health care provider approves. When you can return to sex, practice safe sex and use condoms.  Wipe from front to back. This avoids the spread of bacteria from the rectum to the vagina. General instructions  Take over-the-counter and prescription medicines only as told by your health care provider.  If you were prescribed an antibiotic medicine, take or use it as told by your health care provider. Do not stop taking or using the antibiotic even if you start to feel better.  Keep all follow-up visits as told by your health care provider. This is important. How is this prevented?  Use mild, non-scented products. Do not use things that can irritate the vagina, such as fabric softeners. Avoid the following products if they are scented: ? Feminine sprays. ? Detergents. ? Tampons. ? Feminine hygiene products. ? Soaps or bubble baths.  Let air reach your genital area. ? Wear cotton underwear to reduce moisture buildup. ? Avoid wearing underwear while you sleep. ? Avoid wearing tight pants and underwear or nylons without a cotton panel. ? Avoid wearing thong underwear.  Take off any wet clothing, such as bathing suits, as soon as possible.  Practice safe sex and use condoms. Contact a health care provider if:  You have abdominal pain.  You have a fever.  You have symptoms that last for more than 2-3 days. Get help right away if:  You have a fever and your symptoms suddenly get worse. Summary  Vaginitis is a condition in which the vaginal tissue becomes inflamed.This condition is most often caused by a change in the normal balance of bacteria  and yeast that live in the vagina.  Treatment varies depending on the type of vaginitis you have.  Do not douche, use tampons , or have sex until your health care provider approves. When you can return to sex, practice safe sex and use condoms. This information is not intended to replace advice given to you by your health care provider. Make sure you discuss any questions you have with your health care provider. Document Revised: 07/31/2017 Document Reviewed: 09/23/2016 Elsevier Patient Education  2020 [employer].

## 2020-06-11 NOTE — Telephone Encounter (Signed)
Patient was called with a CBC that was normal, chemistry panel normal with a creatinine up very slightly.  She was not able to leave work today for a CT study so that scheduled for tomorrow at 11 AM.  Patient says she is feeling pretty good.  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  She has noted no fevers or chills, nausea or vomiting.  She does not think she needs to go to the emergency department tonight.  Plan: For right lower quadrant pain with tenderness I will start her on Cipro 500 mg twice daily for 7 days and Flagyl 500 mg 3 times daily for 10 days while we wait for her CT study results.  She was given alarm symptoms to present to the emergency room if symptoms worsen. She voices understanding.

## 2020-06-12 ENCOUNTER — Telehealth: Payer: Self-pay | Admitting: Nurse Practitioner

## 2020-06-12 ENCOUNTER — Other Ambulatory Visit: Payer: Self-pay

## 2020-06-12 ENCOUNTER — Ambulatory Visit
Admission: RE | Admit: 2020-06-12 | Discharge: 2020-06-12 | Disposition: A | Discharge status: Home / Self Care | Payer: BC Managed Care – PPO | Source: Ambulatory Visit | Attending: Nurse Practitioner | Admitting: Nurse Practitioner

## 2020-06-12 DIAGNOSIS — R1031 Right lower quadrant pain: Secondary | ICD-10-CM | POA: Diagnosis present

## 2020-06-12 LAB — RPR: RPR Ser Ql: REACTIVE — AB

## 2020-06-12 LAB — CERVICOVAGINAL ANCILLARY ONLY
Bacterial Vaginitis (gardnerella): NEGATIVE
Candida Glabrata: POSITIVE — AB
Candida Vaginitis: POSITIVE — AB
Comment: NEGATIVE
Comment: NEGATIVE
Comment: NEGATIVE

## 2020-06-12 LAB — RPR TITER: RPR Titer: 1:1 {titer} — ABNORMAL HIGH

## 2020-06-12 LAB — HIV ANTIBODY (ROUTINE TESTING W REFLEX): HIV 1&2 Ab, 4th Generation: NONREACTIVE

## 2020-06-12 LAB — FLUORESCENT TREPONEMAL AB(FTA)-IGG-BLD: Fluorescent Treponemal ABS: NONREACTIVE

## 2020-06-12 MED ORDER — IOHEXOL 300 MG/ML  SOLN
150.0000 mL | Freq: Once | INTRAMUSCULAR | Status: AC | PRN
  Administered 2020-06-12: 100 mL via INTRAVENOUS

## 2020-06-12 MED ORDER — FLUCONAZOLE 150 MG PO TABS: ORAL_TABLET | ORAL | 0 refills | Status: DC

## 2020-06-12 NOTE — Addendum Note (Signed)
Addended by: Denice Paradise A on: 06/12/2020 01:57 PM   Modules accepted: Orders

## 2020-06-12 NOTE — Addendum Note (Signed)
Addended by: Denice Paradise A on: 06/12/2020 02:01 PM   Modules accepted: Orders

## 2020-06-12 NOTE — Telephone Encounter (Addendum)
I called her to give results of the CT scan and to get symptom update. I would like to speak with her when she calls back about plan of care.   She called back and I was able to talk with her:  The CT abd/pelvis study showed no findings to explain the right lower quadrant pain. There were no findings of appendicitis or diverticulitis. She has a known enlarged fibroid uterus. There is a indeterminate left adrenal nodule present and she does not have a history of malignancy.A small left kidney cyst.   PLAN: No appendicitis or diverticulitis. No bladder or kidney abnormality. No kidney stone. This was a reassuring CT scan. Her CBC was normal. She may discontinue the Cipro and the Flagyl, Florastor as no evidence of infection.  She may try Tylenol, heat, and monitor her right lower quadrant pain. She has taken no analgesic medication. She has chronic back pain, and she is thinking this may be related to her chronic lower back pain. Will monitor and call back in 2 days if not resolving.   I do recommend further evaluation with her gynecologist. She has known uterine fibroids and is overdue for repeat GYN exam. I have asked her to set up her GYN exam.  Her vaginal cytology returned with positive candidiasis and I ordered Diflucan for her. Neg BV- other STI pending.   She also tested positive for history of syphilis and was treated at the [facility] last year. Looks like she still has low titers and this is not an active infection.  I recommend referral to endocrinology for the adrenal nodule found. Patient is agreement, referral placed.  She has a very small left kidney cyst, and I will check with urology colleagues for their recommended follow-up, if any.  Janett Billow please check with the [facility] health department regarding what her RPR titers were initially last year. Thx

## 2020-06-12 NOTE — Telephone Encounter (Signed)
Patient called back and spoke with Maudie Mercury; sending back for documentation if needed

## 2020-06-13 ENCOUNTER — Telehealth: Payer: Self-pay | Admitting: Nurse Practitioner

## 2020-06-13 ENCOUNTER — Ambulatory Visit: Payer: BC Managed Care – PPO | Admitting: Pain Medicine

## 2020-06-13 DIAGNOSIS — R7989 Other specified abnormal findings of blood chemistry: Secondary | ICD-10-CM

## 2020-06-13 LAB — URINE CULTURE
MICRO NUMBER:: 11055858
SPECIMEN QUALITY:: ADEQUATE

## 2020-06-13 NOTE — Telephone Encounter (Signed)
Patient advised to let us know if symptoms do not resolve.  She is going to hydrate well, monitor her symptoms, take antibiotics as directed, and follow-up as needed.  She has a GYN appointment coming up.  She has a small left kidney cyst on CT study, no stone was seen with a contrasted study.   Jessica:  1. Please call her for follow-up with symptom update in 2 days.   2. Please ask her to come in for repeat the [facility] in 1 week to check kidney function that was slightly elevated. She will need lab appt.

## 2020-06-13 NOTE — Telephone Encounter (Signed)
Spoke with [facility] and they are faxing over RPR titer results from last year

## 2020-06-14 ENCOUNTER — Ambulatory Visit: Payer: BC Managed Care – PPO | Admitting: Student in an Organized Health Care Education/Training Program

## 2020-06-14 ENCOUNTER — Other Ambulatory Visit: Payer: Self-pay

## 2020-06-15 NOTE — Telephone Encounter (Signed)
Spoke with HD but whoever could get me the results was not around; left a message to see if they can call back before 5 pm and if not that I will call back Monday.

## 2020-06-15 NOTE — Telephone Encounter (Signed)
Tried to call [facility] three times in a row and it rings a couple of times and hangs up; will try again later. Results were never faxed over.

## 2020-06-15 NOTE — Telephone Encounter (Signed)
Spoke with patient and she states she is still in a little bit of pain but other than that is okay. She has appt for f/u already scheduled for 06/18/20. I have been trying to call the HD for last years results; spoke with them 2 days ago and were supposed to fax them over but have not seen them. Tried to call three times today and it rings and rings then hangs up.

## 2020-06-18 ENCOUNTER — Ambulatory Visit: Payer: BC Managed Care – PPO | Admitting: Nurse Practitioner

## 2020-06-18 ENCOUNTER — Encounter: Payer: Self-pay | Admitting: Nurse Practitioner

## 2020-06-18 ENCOUNTER — Other Ambulatory Visit: Payer: Self-pay

## 2020-06-18 VITALS — BP 114/76 | HR 86 | Temp 98.3°F | Ht 62.0 in | Wt 248.0 lb

## 2020-06-18 DIAGNOSIS — R1031 Right lower quadrant pain: Secondary | ICD-10-CM | POA: Diagnosis not present

## 2020-06-18 DIAGNOSIS — N3000 Acute cystitis without hematuria: Secondary | ICD-10-CM

## 2020-06-18 DIAGNOSIS — D259 Leiomyoma of uterus, unspecified: Secondary | ICD-10-CM

## 2020-06-18 DIAGNOSIS — R7989 Other specified abnormal findings of blood chemistry: Secondary | ICD-10-CM

## 2020-06-18 DIAGNOSIS — N390 Urinary tract infection, site not specified: Secondary | ICD-10-CM

## 2020-06-18 DIAGNOSIS — E278 Other specified disorders of adrenal gland: Secondary | ICD-10-CM | POA: Diagnosis not present

## 2020-06-18 DIAGNOSIS — B9620 Unspecified Escherichia coli [E. coli] as the cause of diseases classified elsewhere: Secondary | ICD-10-CM

## 2020-06-18 HISTORY — DX: Acute cystitis without hematuria: N30.00

## 2020-06-18 NOTE — Telephone Encounter (Signed)
Dawson Bills, NP was able to speak with [facility] about results she needed.

## 2020-06-18 NOTE — Progress Notes (Signed)
Established Patient Office Visit  Subjective:  Patient ID: Anita Gibbs, female    DOB: [birth year]  Age: 49 y.o. MRN: [id number]  CC:  Chief Complaint  Patient presents with  . Follow-up    HPI Anita Gibbs is a 49 yo who presents for f/up of RLQ abdomen pain. Finding E coli UTI. Doing well now and the pain is gone. She is taking on Cipro and Flagyl and it helping. She has menstrual cramps now- cycle just started. She is 90% better.   06/12/20: CT of the abdomen pelvis with contrast revealed no acute findings in the abdomen or pelvis.  The appendix is normal.  There is enlarged fibroid uterus.  Incidental finding of an indeterminate left adrenal nodule measuring 1.4 cm.  She does not have a personal history of cancer and therefore the radiologist recommended to repeat the CT study in 1 year .  GYN/STI: Appt next Tues for uterine fibroids and mild tenderness right adnexal area with pelvic exam. Cervical vaginal ancillary only: Negative for bacterial vaginitis, positive for Candida,  treated with diflucan for discharge- resolved. We did not get back the GC/chlamydia/trichomonas- will likely need repeated. PAP test was NOT done and patient is not sure when it is due.   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 2. 4 million weekly x 3. Advised to check annual RPR and if the  titer climbs 1:4 or higher and then re-infected and needs re- treated.  Patient reports she is not with a new partner at this time.  Past Medical History:  Diagnosis Date  . Arthritis   . Carpal boss, right   . Carpal tunnel syndrome, bilateral   . Headache   . Hypertension   . Seasonal asthma 11/29/2018    Past Surgical History:  Procedure Laterality Date  . ANTERIOR CERVICAL DECOMP/DISCECTOMY FUSION N/A 07/01/2017   Procedure: ANTERIOR CERVICAL DECOMPRESSION/DISCECTOMY FUSION 3 LEVELS;   Surgeon: Meade Maw, MD;  Location: [facility] ORS;  Service: Neurosurgery;  Laterality: N/A;  . KNEE ARTHROSCOPY Right     Family History  Problem Relation Age of Onset  . [name] Mother   . Diabetes Mother   . Arthritis Mother   . Hypertension Mother   . Hyperlipidemia Mother   . Hypertension Father   . Arthritis Father   . Hearing loss Father   . Kidney disease Father   . Lymphoma Father   . Arthritis Sister   . Hypertension Sister   . Cancer Brother        ? lung due to exposure   . COPD Brother   . Early death Brother   . COPD Son   . Depression Son   . Breast cancer Maternal Aunt   . Breast cancer Paternal Aunt   . Sleep apnea Daughter   . Allergic rhinitis Neg Hx   . Angioedema Neg Hx   . Eczema Neg Hx   . Urticaria Neg Hx   . Asthma Neg Hx     Social History   Socioeconomic History  . Marital status: Widowed    Spouse name: Not on file  . Number of children: Not on file  . Years of education: Not on file  . Highest education level: Not on file  Occupational History  . Not on file  Tobacco Use  . Smoking status: Never Smoker  .  Smokeless tobacco: Never Used  Vaping Use  . Vaping Use: Never used  Substance and Sexual Activity  . Alcohol use: Yes    Comment: occ  . Drug use: No  . Sexual activity: Yes    Birth control/protection: None  Other Topics Concern  . Not on file  Social History Narrative   Bus driver    And school custodian    1 daughter lives in [place] 2 y.o as of 01/17/20   1 son 58 almost 38 in [place] Strain:   . Difficulty of Paying Living Expenses: Not on file  Food Insecurity:   . Worried About Charity fundraiser in the Last Year: Not on file  . Ran Out of Food in the Last Year: Not on file  Transportation Needs:   . Lack of Transportation (Medical): Not on file  . Lack of Transportation (Non-Medical): Not on file  Physical Activity:   . Days of Exercise  per Week: Not on file  . Minutes of Exercise per Session: Not on file  Stress:   . Feeling of Stress : Not on file  Social Connections:   . Frequency of Communication with Friends and Family: Not on file  . Frequency of Social Gatherings with Friends and Family: Not on file  . Attends Religious Services: Not on file  . Active Member of Clubs or Organizations: Not on file  . Attends Archivist Meetings: Not on file  . Marital Status: Not on file  Intimate Partner Violence:   . Fear of Current or Ex-Partner: Not on file  . Emotionally Abused: Not on file  . Physically Abused: Not on file  . Sexually Abused: Not on file    Outpatient Medications Prior to Visit  Medication Sig Dispense Refill  . albuterol (PROVENTIL HFA;VENTOLIN HFA) 108 (90 Base) MCG/ACT inhaler Inhale 2 puffs into the lungs every 6 (six) hours as needed for wheezing or shortness of breath. 1 Inhaler 5  . amLODipine (NORVASC) 5 MG tablet Take 1 tablet (5 mg total) by mouth daily. 90 tablet 3  . atorvastatin (LIPITOR) 10 MG tablet Take 1 tablet (10 mg total) by mouth daily at 6 PM. 90 tablet 3  . Cetirizine HCl (ZYRTEC PO) Take 25 mg by mouth.    . Cholecalciferol (VITAMIN D-3) 125 MCG (5000 UT) TABS Take by mouth daily.    . ciprofloxacin (CIPRO) 500 MG tablet Take 1 tablet (500 mg total) by mouth 2 (two) times daily for 7 days. 14 tablet 0  . diphenhydrAMINE (BENADRYL) 25 mg capsule Take 25 mg by mouth every 6 (six) hours as needed (for allergies.).    Marland Kitchen EPINEPHrine (AUVI-Q) 0.3 mg/0.3 mL IJ SOAJ injection Use as directed for severe allergic reaction 2 Device 2  . fluticasone (FLONASE) 50 MCG/ACT nasal spray Place 2 sprays into both nostrils daily. 16 g 6  . hydrochlorothiazide (HYDRODIURIL) 25 MG tablet Take 0.5 tablets (12.5 mg total) by mouth daily. In am 90 tablet 3  . hydrocortisone 2.5 % cream Apply topically 2 (two) times daily. Prn to eyelids 30 g 0  . losartan (COZAAR) 100 MG tablet Take 1 tablet (100  mg total) by mouth daily. 90 tablet 3  . meloxicam (MOBIC) 15 MG tablet Take 1 tablet (15 mg total) by mouth daily. 30 tablet 1  . montelukast (SINGULAIR) 10 MG tablet Take 1 tablet (10 mg total) by mouth at bedtime.  90 tablet 3  . norethindrone (AYGESTIN) 5 MG tablet 2 (two) times daily.     . Olopatadine HCl 0.2 % SOLN Apply 1 drop to eye daily. Both eyes 2.5 mL 11  . pantoprazole (PROTONIX) 40 MG tablet Take 1 tablet (40 mg total) by mouth daily. 30 min. Before dinner 90 tablet 3  . phentermine (ADIPEX-P) 37.5 MG tablet Take 1 tablet (37.5 mg total) by mouth daily before breakfast. rx 1/2 60 tablet 0  . saccharomyces boulardii (FLORASTOR) 250 MG capsule Take 1 capsule (250 mg total) by mouth 2 (two) times daily for 14 days. 28 capsule 0  . methocarbamol (ROBAXIN) 750 MG tablet Take 1 tablet (750 mg total) by mouth every 8 (eight) hours as needed for muscle spasms. 90 tablet 5  . erythromycin ophthalmic ointment Place 1 application into both eyes 3 (three) times daily. X 4-7 days 3.5 g 0  . fluconazole (DIFLUCAN) 150 MG tablet Take 1 pill now and if still having symotms in 3 days, may repeat x 1. 2 tablet 0  . gabapentin (NEURONTIN) 300 MG capsule Take 1-2 capsules (300-600 mg total) by mouth 3 (three) times daily. 180 capsule 5  . metroNIDAZOLE (FLAGYL) 500 MG tablet Take 1 tablet (500 mg total) by mouth 3 (three) times daily for 10 days. 30 tablet 0  . phentermine (ADIPEX-P) 37.5 MG tablet Take 1 tablet (37.5 mg total) by mouth daily before breakfast. 60 tablet 0   No facility-administered medications prior to visit.    Allergies  Allergen Reactions  . Other Hives    White and red sauces  . Shellfish Allergy Hives  . Strawberry (Diagnostic) Hives  . Tomato Hives    cherry    Review of Systems  Constitutional: Negative for chills and fever.  Respiratory: Negative.   Cardiovascular: Negative.   Gastrointestinal: Negative.   Genitourinary: Negative.  Negative for dysuria, flank  pain, pelvic pain, vaginal discharge and vaginal pain.      Objective:    Physical Exam Vitals reviewed.  HENT:     Head: Normocephalic.  Cardiovascular:     Rate and Rhythm: Normal rate and regular rhythm.     Pulses: Normal pulses.     Heart sounds: Normal heart sounds.  Pulmonary:     Effort: Pulmonary effort is normal.     Breath sounds: Normal breath sounds.  Abdominal:     Palpations: Abdomen is soft.     Tenderness: There is no abdominal tenderness.  Musculoskeletal:     Cervical back: Normal range of motion and neck supple.  Skin:    General: Skin is warm and dry.  Neurological:     Mental Status: She is alert.     BP 114/76 (BP Location: Left Arm, Patient Position: Sitting, Cuff Size: Normal)   Pulse 86   Temp 98.3 F (36.8 C) (Oral)   Ht 5\' 2"  (1.575 m)   Wt 248 lb (112.5 kg)   SpO2 99%   BMI 45.36 kg/m  Wt Readings from Last 3 Encounters:  06/18/20 248 lb (112.5 kg)  06/11/20 252 lb (114.3 kg)  06/07/20 246 lb (111.6 kg)   Pulse Readings from Last 3 Encounters:  06/18/20 86  06/11/20 85  06/07/20 63    BP Readings from Last 3 Encounters:  06/18/20 114/76  06/11/20 122/80  06/07/20 116/67    Lab Results  Component Value Date   CHOL 156 02/14/2020   HDL 50.10 02/14/2020   LDLCALC 94 02/14/2020  TRIG 62.0 02/14/2020   CHOLHDL 3 02/14/2020      Health Maintenance Due  Topic Date Due  . Hepatitis C Screening  Never done  . INFLUENZA VACCINE  04/01/2020    There are no preventive care reminders to display for this patient.  Lab Results  Component Value Date   TSH 3.33 10/10/2019   Lab Results  Component Value Date   WBC 7.4 06/11/2020   HGB 13.0 06/11/2020   HCT 39.0 06/11/2020   MCV 100.3 (H) 06/11/2020   PLT 353.0 06/11/2020   Lab Results  Component Value Date   NA 139 06/11/2020   K 3.9 06/11/2020   CO2 28 06/11/2020   GLUCOSE 80 06/11/2020   BUN 14 06/11/2020   CREATININE 1.23 (H) 06/11/2020   BILITOT 0.4 06/11/2020    ALKPHOS 45 06/11/2020   AST 18 06/11/2020   ALT 29 06/11/2020   PROT 8.2 06/11/2020   ALBUMIN 4.5 06/11/2020   CALCIUM 10.1 06/11/2020   ANIONGAP 10 04/28/2018   GFR 52.22 (L) 06/11/2020   Lab Results  Component Value Date   CHOL 156 02/14/2020   Lab Results  Component Value Date   HDL 50.10 02/14/2020   Lab Results  Component Value Date   LDLCALC 94 02/14/2020   Lab Results  Component Value Date   TRIG 62.0 02/14/2020   Lab Results  Component Value Date   CHOLHDL 3 02/14/2020   Lab Results  Component Value Date   HGBA1C 6.2 02/14/2020      Assessment & Plan:   Problem List Items Addressed This Visit      Genitourinary   Fibroid uterus   Acute cystitis without hematuria     Other   RLQ abdominal pain - Primary   Elevated serum creatinine   Adrenal nodule (HCC)     No orders of the defined types were placed in this encounter.  Patient had right lower quadrant pain rated 8 out of 10 and a CT study was unrevealing. Urine culture returned positive for E. coli. She started Cipro/Flagyl prescribed empirically for possible diverticulitis. The CT study ruled out obvious diverticulitis. The Cipro is treating her E. coli UTI. She believes the Flagyl has helped as well so she is continuing her therapy and her right lower quadrant pain has resolved. I am wondering if she had trichomonas that has responded to the Flagyl.  She also had pelvic discomfort on the right adnexal area, large uterine fibroids. She is on her menstrual cycle now. No heavy dysfunctional uterine bleeding. STI testing revealed candidiasis but we did not get reports on GC chlamydia or trichomonas. According to the [facility] STI nurse, she was treated for latent syphilis last year, her RPR titers are still low. Recommend annual RPR titers and if start to climb 1:4 or greater, then she needs to be retreated. She has not had a Pap test recently through chart review. She is set up to see her  gynecologist next week.  Patient advised: Continue with the Cipro and Flagyl as you are doing.  Take a probiotic to put good bacteria back in the colon.   Keep your GYN appt next Tues- fibroids. We did not perform a PAP test. The culture- for gonorrhea/chlamydia has not resulted. If we do not get the results, it should be repeated at your GYN visit as you requested testing.   You have positive RPR and low titers- the syphilis was treated at [facility] last year. Annual  RPR is recommended.  Follow up with your primary care provider as needed.   Addendum 06/20/2020: The incidental finding of the left adrenal gland indeterminate nodule 1.4 cm consider referral to endocrinology for evaluation and management. Will discuss with Dr. Gayland Curry if that would be her plan of care as well.  In addition, recent chemistry showed decrease in creatinine and GFR. Would recommend hydration and repeat the lab. Patient does not have a history of chronic kidney disease. She does take daily Mobic 15 mg and that may be contributory. Blood pressure 114/76. A1c 6.2 with history of prediabetes. She is not on Metformin. She has had CT with contrast. She will called to come back for a BMet when she is able in the next 2 weeks.    Follow-up: Return if symptoms worsen or fail to improve.   This visit occurred during the SARS-CoV-2 public health emergency.  Safety protocols were in place, including screening questions prior to the visit, additional usage of staff PPE, and extensive cleaning of exam room while observing appropriate contact time as indicated for disinfecting solutions.    Denice Paradise, NP

## 2020-06-18 NOTE — Patient Instructions (Addendum)
Continue with the Cipro and Flagyl as you are doing.  Take a probiotic to put good bacteria back in the colon.   Keep your GYN appt next Tues- fibroids. We did not perform a PAP test. The culture- for gonorrhea/chlamydia has not resulted. If we do not get the results, it should be repeated at your GYN visit as you requested testing.   You have positive RPR and low titers- the syphilis was treated at [facility] last year.   Follow up with your primary care provider as needed.

## 2020-06-20 ENCOUNTER — Telehealth: Payer: Self-pay | Admitting: Nurse Practitioner

## 2020-06-20 DIAGNOSIS — N390 Urinary tract infection, site not specified: Secondary | ICD-10-CM

## 2020-06-20 DIAGNOSIS — B9620 Unspecified Escherichia coli [E. coli] as the cause of diseases classified elsewhere: Secondary | ICD-10-CM

## 2020-06-20 DIAGNOSIS — E278 Other specified disorders of adrenal gland: Secondary | ICD-10-CM | POA: Insufficient documentation

## 2020-06-20 DIAGNOSIS — R7989 Other specified abnormal findings of blood chemistry: Secondary | ICD-10-CM

## 2020-06-20 DIAGNOSIS — E279 Disorder of adrenal gland, unspecified: Secondary | ICD-10-CM

## 2020-06-20 HISTORY — DX: Other specified abnormal findings of blood chemistry: R79.89

## 2020-06-20 HISTORY — DX: Disorder of adrenal gland, unspecified: E27.9

## 2020-06-20 HISTORY — DX: Unspecified Escherichia coli (E. coli) as the cause of diseases classified elsewhere: B96.20

## 2020-06-20 HISTORY — DX: Other specified disorders of adrenal gland: E27.8

## 2020-06-20 HISTORY — DX: Unspecified Escherichia coli (E. coli) as the cause of diseases classified elsewhere: N39.0

## 2020-06-20 NOTE — Telephone Encounter (Signed)
Resolved at office visit.

## 2020-06-20 NOTE — Telephone Encounter (Signed)
1. Please ask Dr. Gayland Curry to review my last OV note and if she agrees,  I will place ENDO referral for left adrenal gland incidental finding on the CT abd/pelvis.  She also had a small kidney cyst 1 x 1.2 cm not mentioned in impresson of the CT study.   Also, Marsia needs a lab appt to get her Bmet re checked next week - non fasting.

## 2020-06-21 NOTE — Telephone Encounter (Addendum)
I spoke with the radiologist about the incidental finding of the 1.4 x 1.2  cm left adrenal nodule.  This patient does not have a history of an adrenal nodule and there is no record to compare.  She does not have a personal history of malignancy.  She does not have endocrine type symptoms to be concerned about.  The guideline recommendation is to monitor this for stabilization at 1 year.  Since the size of this is between 1 and 2 cm, the chance of this being malignant is extremely small so that 1 year interval imaging is recommended. No recommendation for Endocrine Consult or lab work with a nodule of this size that is asymptomatic.   PLAN: I called and left a HIPAA compliant message to call the office:  She called back and was advised that she would need a a repeat CT of adrenal gland in 1 year 06/2021.   Per Radiology: Order an adrenal protocol CT of the abdomen with and without contrast October 2022. The easiest way to order this is a CT ABD W and WO and in comments write say "ADRENAL PROTOCOL". The radiologist does not see an indication to perform it sooner- unless she has adrenal symptoms.

## 2020-06-21 NOTE — Telephone Encounter (Signed)
We can f/u in 1 year I am ok and often call radiology when in doubt thanks

## 2020-06-21 NOTE — Telephone Encounter (Signed)
You can do adrenal protocol CT call radiology and figure out how to order/correct order [phone] Order this for 3-6 months  Hydration 55-64 ounces of water  No need to see endocrine at this time

## 2020-06-26 DIAGNOSIS — Z6841 Body Mass Index (BMI) 40.0 and over, adult: Secondary | ICD-10-CM | POA: Insufficient documentation

## 2020-06-26 LAB — CYTOLOGY - PAP
Chlamydia: NEGATIVE
Comment: NEGATIVE
Comment: NEGATIVE
Comment: NEGATIVE
Comment: NEGATIVE
Comment: NORMAL
Diagnosis: NEGATIVE
HSV1: NEGATIVE
HSV2: NEGATIVE
High risk HPV: NEGATIVE
Neisseria Gonorrhea: NEGATIVE
Trichomonas: NEGATIVE

## 2020-06-27 ENCOUNTER — Ambulatory Visit
Admission: RE | Admit: 2020-06-27 | Discharge: 2020-06-27 | Disposition: A | Discharge status: Home / Self Care | Payer: BC Managed Care – PPO | Source: Ambulatory Visit | Attending: Student in an Organized Health Care Education/Training Program | Admitting: Student in an Organized Health Care Education/Training Program

## 2020-06-27 ENCOUNTER — Other Ambulatory Visit: Payer: Self-pay

## 2020-06-27 ENCOUNTER — Ambulatory Visit (HOSPITAL_BASED_OUTPATIENT_CLINIC_OR_DEPARTMENT_OTHER): Payer: BC Managed Care – PPO | Admitting: Student in an Organized Health Care Education/Training Program

## 2020-06-27 DIAGNOSIS — M5442 Lumbago with sciatica, left side: Secondary | ICD-10-CM

## 2020-06-27 DIAGNOSIS — G8929 Other chronic pain: Secondary | ICD-10-CM | POA: Diagnosis present

## 2020-06-27 DIAGNOSIS — M47817 Spondylosis without myelopathy or radiculopathy, lumbosacral region: Secondary | ICD-10-CM

## 2020-06-27 DIAGNOSIS — G894 Chronic pain syndrome: Secondary | ICD-10-CM | POA: Insufficient documentation

## 2020-06-27 DIAGNOSIS — M5441 Lumbago with sciatica, right side: Secondary | ICD-10-CM | POA: Diagnosis present

## 2020-06-27 DIAGNOSIS — M47816 Spondylosis without myelopathy or radiculopathy, lumbar region: Secondary | ICD-10-CM

## 2020-06-27 MED ORDER — LIDOCAINE HCL 2 % IJ SOLN
INTRAMUSCULAR | Status: AC
  Filled 2020-06-27: qty 20

## 2020-06-27 MED ORDER — ROPIVACAINE HCL 2 MG/ML IJ SOLN
INTRAMUSCULAR | Status: AC
  Filled 2020-06-27: qty 10

## 2020-06-27 MED ORDER — LIDOCAINE HCL 2 % IJ SOLN
20.0000 mL | Freq: Once | INTRAMUSCULAR | Status: AC
  Administered 2020-06-27: 400 mg

## 2020-06-27 MED ORDER — ROPIVACAINE HCL 2 MG/ML IJ SOLN
9.0000 mL | Freq: Once | INTRAMUSCULAR | Status: AC
  Administered 2020-06-27: 10 mL via PERINEURAL

## 2020-06-27 MED ORDER — DIAZEPAM 5 MG PO TABS
ORAL_TABLET | ORAL | Status: AC
  Filled 2020-06-27: qty 1

## 2020-06-27 NOTE — Progress Notes (Signed)
PROVIDER NOTE: Information contained herein reflects review and annotations entered in association with encounter. Interpretation of such information and data should be left to medically-trained personnel. Information provided to patient can be located elsewhere in the medical record under "Patient Instructions". Document created using STT-dictation technology, any transcriptional errors that may result from process are unintentional.    Patient: Anita Gibbs  Service Category: Procedure  Provider: Gillis Santa, MD  DOB: 09-Apr-1971  DOS: 06/27/2020  Location: [facility]  MRN: [id number]  Setting: Ambulatory - outpatient  Referring Provider: Gillis Santa, MD  Type: Established Patient  Specialty: Interventional Pain Management  PCP: McLean-Scocuzza, Nino Glow, MD   Primary Reason for Visit: Interventional Pain Management Treatment. CC: Back Pain (low and worse on the right)  Procedure:          Anesthesia, Analgesia, Anxiolysis:  Sprint peripheral nerve stimulation of right L4 medial branch nerve for low back pain related to lumbar facet arthropathy under fluoroscopy and ultrasound guidance  Type: Local Anesthesia   Local Anesthetic: Lidocaine 1-2%  Position: Prone   Indications: 1. Spondylosis without myelopathy or radiculopathy, lumbosacral region   2. Lumbar facet syndrome (Bilateral) (R>L)   3. Lumbar facet arthropathy   4. Chronic low back pain [facility]) (Bilateral) (R>L) w/ sciatica (Bilateral)   5. Chronic pain syndrome    Pain Score: Pre-procedure: 8 /10 Post-procedure: 4/10   Pre-op Assessment:  Anita Gibbs is a 49 y.o. (year old), female patient, seen today for interventional treatment. She  has a past surgical history that includes Knee arthroscopy (Right) and Anterior cervical decomp/discectomy fusion (N/A, 07/01/2017). Anita Gibbs has a current medication list which includes the following prescription(s): albuterol, amlodipine, atorvastatin,  cetirizine hcl, vitamin d-3, diphenhydramine, epinephrine, fluticasone, hydrochlorothiazide, hydrocortisone, losartan, meloxicam, methocarbamol, montelukast, norethindrone, olopatadine hcl, pantoprazole, and phentermine. Her primarily concern today is the Back Pain (low and worse on the right)  Initial Vital Signs:  Pulse/HCG Rate: 78ECG Heart Rate: 80 Temp: (!) 97.2 F (36.2 C) Resp: 18 BP: 120/77 SpO2: 100 %  BMI: Estimated body mass index is 43.4 kg/m as calculated from the following:   Height as of this encounter: 5\' 3"  (1.6 m).   Weight as of this encounter: 245 lb (111.1 kg).  Risk Assessment: Allergies: Reviewed. She is allergic to other, shellfish allergy, strawberry (diagnostic), and tomato.  Allergy Precautions: None required Coagulopathies: Reviewed. None identified.  Blood-thinner therapy: None at this time Active Infection(s): Reviewed. None identified. Anita Gibbs is afebrile  Site Confirmation: Anita Gibbs was asked to confirm the procedure and laterality before marking the site Procedure checklist: Completed Consent: Before the procedure and under the influence of no sedative(s), amnesic(s), or anxiolytics, the patient was informed of the treatment options, risks and possible complications. To fulfill our ethical and legal obligations, as recommended by the American Medical Association's Code of Ethics, I have informed the patient of my clinical impression; the nature and purpose of the treatment or procedure; the risks, benefits, and possible complications of the intervention; the alternatives, including doing nothing; the risk(s) and benefit(s) of the alternative treatment(s) or procedure(s); and the risk(s) and benefit(s) of doing nothing. The patient was provided information about the general risks and possible complications associated with the procedure. These may include, but are not limited to: failure to achieve desired goals, infection, bleeding, organ or nerve  damage, allergic reactions, paralysis, and death. In addition, the patient was informed of those risks and complications associated to Spine-related procedures, such  as failure to decrease pain; infection (i.e.: Meningitis, epidural or intraspinal abscess); bleeding (i.e.: epidural hematoma, subarachnoid hemorrhage, or any other type of intraspinal or peri-dural bleeding); organ or nerve damage (i.e.: Any type of peripheral nerve, nerve root, or spinal cord injury) with subsequent damage to sensory, motor, and/or autonomic systems, resulting in permanent pain, numbness, and/or weakness of one or several areas of the body; allergic reactions; (i.e.: anaphylactic reaction); and/or death. Furthermore, the patient was informed of those risks and complications associated with the medications. These include, but are not limited to: allergic reactions (i.e.: anaphylactic or anaphylactoid reaction(s)); adrenal axis suppression; blood sugar elevation that in diabetics may result in ketoacidosis or comma; water retention that in patients with history of congestive heart failure may result in shortness of breath, pulmonary edema, and decompensation with resultant heart failure; weight gain; swelling or edema; medication-induced neural toxicity; particulate matter embolism and blood vessel occlusion with resultant organ, and/or nervous system infarction; and/or aseptic necrosis of one or more joints. Finally, the patient was informed that Medicine is not an exact science; therefore, there is also the possibility of unforeseen or unpredictable risks and/or possible complications that may result in a catastrophic outcome. The patient indicated having understood very clearly. We have given the patient no guarantees and we have made no promises. Enough time was given to the patient to ask questions, all of which were answered to the patient's satisfaction. Anita Gibbs has indicated that she wanted to continue with the procedure.  Attestation: I, the ordering provider, attest that I have discussed with the patient the benefits, risks, side-effects, alternatives, likelihood of achieving goals, and potential problems during recovery for the procedure that I have provided informed consent. Date  Time: 06/27/2020  8:38 AM  Pre-Procedure Preparation:  Monitoring: As per clinic protocol. Respiration, ETCO2, SpO2, BP, heart rate and rhythm monitor placed and checked for adequate function Safety Precautions: Patient was assessed for positional comfort and pressure points before starting the procedure. Time-out: I initiated and conducted the "Time-out" before starting the procedure, as per protocol. The patient was asked to participate by confirming the accuracy of the "Time Out" information. Verification of the correct person, site, and procedure were performed and confirmed by me, the nursing staff, and the patient. "Time-out" conducted as per Joint Commission's Universal Protocol (UP.01.01.01). Time: 0859  Description of Procedure:          Sprint peripheral nerve stimulation right L4 medial branch nerve  After the risks, benefits and alternatives were discussed  with the patient and informed consent was obtained, patient  was placed in the prone position and padded to foster comfort.  Prior to delivery of anesthetics, the painful region was carefully  outlined with a marker. Appropriate skin and bony landmarks  were identified, and pertinent vascular structures were located.   The skin overlying the lumbosacral spine was prepped and  draped in sterile fashion. Fluoroscopy was used to identify the spinous process and lamina in the center of the  patient's region of pain. After identifying and marking the intended target along the course of the medial branch nerve,  the skin around the planned entry point and the subcutaneous tissues are injected with local anesthetic.  An introducer needle and stimulating probe were  assembled,  inserted and advanced along the intended course of the medial branch nerve as it traverses the lamina medial and inferior to the zygapophyseal joint, taking care to maintain the proper depth of insertion as the introducer is advanced under  fluoroscopic. The introducer needle was delivered to a location in proximity to the nerve.  Multiple stimulation parameters were used to deliver stimulation to the medial branch nerve in concert with stimulating at multiple positions around the nerve. Nerve target  acquisition was confirmed noting generation of paresthesias in the paravertebral regions corresponding to the level being stimulated as well as rhythmic thumping within the multifidi, the  latter being further corroborated via palpation.   Various electrical parameter combinations were tested, and the lead location was adjusted (physically relocated) until the patient  indicated paresthesia or muscle tension overlapping the distribution of the patient's typical region of pain.   The stimulating probe was removed from the introducer and a percutaneous lead was guided through the needle and delivered to a location in similar proximity to the nerve. Final  location was verified with electrical stimulation and documented with fluoroscopy & ultrasound.   The introducer needle was removed, and the exposed end of the percutaneous lead was attached to an external stimulator unit. Various electrical parameter combinations were again  tested until the patient indicated paresthesia or muscle tension  overlapping the distribution of the patient's typical region of pain.   After confirming that lead impedance was in the normal range, the external unit was detached, the needle was removed, and the lead was anchored at the skin.  The lead was threaded into the connector block and electrical continuity and desired patient response was confirmed. The connector block was attached to the external  stimulator unit.The  site was covered with a sterile occlusive dressing and  a fluoroscopic and ultrasound image was taken to document final placement. The patient was observed for stability of vital signs and comfort.      Illustration of the posterior view of the lumbar spine and the posterior neural structures. Laminae of L2 through S1 are labeled. DPRL5, dorsal primary ramus of L5; DPRS1, dorsal primary ramus of S1; DPR3, dorsal primary ramus of L3; FJ, facet (zygapophyseal) joint L3-L4; I, inferior articular process of L4; LB1, lateral branch of dorsal primary ramus of L1; IAB, inferior articular branches from L3 medial branch (supplies L4-L5 facet joint); IBP, intermediate branch plexus; MB3, medial branch of dorsal primary ramus of L3; NR3, third lumbar nerve root; S, superior articular process of L5; SAB, superior articular branches from L4 (supplies L4-5 facet joint also); TP3, transverse process of L3.  Vitals:   06/27/20 0841 06/27/20 0903 06/27/20 0908 06/27/20 0912  BP: 120/77 112/84 124/79 120/85  Pulse: 78     Resp: 18 20 (!) 23 18  Temp: (!) 97.2 F (36.2 C)     TempSrc: Temporal     SpO2: 100% 100% 100% 100%  Weight: 245 lb (111.1 kg)     Height: 5\' 3"  (1.6 m)        Start Time: 0859 hrs. End Time: 0912 hrs.  Imaging Guidance (Spinal):          Type of Imaging Technique: Fluoroscopy Guidance (Spinal) and ultrasound guidance to confirm multifidus of lumbar paraspinal muscle activation once lead was placed Indication(s): Assistance in needle guidance and placement for procedures requiring needle placement in or near specific anatomical locations not easily accessible without such assistance. Exposure Time: Please see nurses notes. Contrast: None used. Fluoroscopic Guidance: I was personally present during the use of fluoroscopy. "Tunnel Vision Technique" used to obtain the best possible view of the target area. Parallax error corrected before commencing the procedure.  "Direction-depth-direction" technique used to introduce the needle  under continuous pulsed fluoroscopy. Once target was reached, antero-posterior, oblique, and lateral fluoroscopic projection used confirm needle placement in all planes. Images permanently stored in EMR. Interpretation: No contrast injected. I personally interpreted the imaging intraoperatively. Adequate needle placement confirmed in multiple planes. Permanent images saved into the patient's record.  Antibiotic Prophylaxis:   Anti-infectives (From admission, onward)   None     Indication(s): None identified  Post-operative Assessment:  Post-procedure Vital Signs:  Pulse/HCG Rate: 7871 Temp: (!) 97.2 F (36.2 C) Resp: 18 BP: 120/85 SpO2: 100 %  EBL: None  Complications: No immediate post-treatment complications observed by team, or reported by patient.  Note: The patient tolerated the entire procedure well. A repeat set of vitals were taken after the procedure and the patient was kept under observation following institutional policy, for this type of procedure. Post-procedural neurological assessment was performed, showing return to baseline, prior to discharge. The patient was provided with post-procedure discharge instructions, including a section on how to identify potential problems. Should any problems arise concerning this procedure, the patient was given instructions to immediately contact us, at any time, without hesitation. In any case, we plan to contact the patient by telephone for a follow-up status report regarding this interventional procedure.  Comments:  No additional relevant information.  Plan of Care  Orders:  Orders Placed This Encounter  Procedures  . [facility] C-ARM 1-60 MIN NO REPORT    Intraoperative interpretation by procedural physician at [facility].    Standing Status:   Standing    Number of Occurrences:   1    Order Specific Question:   Reason for exam:    Answer:    Assistance in needle guidance and placement for procedures requiring needle placement in or near specific anatomical locations not easily accessible without such assistance.   Medications ordered for procedure: Meds ordered this encounter  Medications  . lidocaine (XYLOCAINE) 2 % (with pres) injection 400 mg  . ropivacaine (PF) 2 mg/mL (0.2%) (NAROPIN) injection 9 mL   Medications administered: We administered lidocaine and ropivacaine (PF) 2 mg/mL (0.2%).  See the medical record for exact dosing, route, and time of administration.  Follow-up plan:   Return for [facility]. appt for left L4 medial branch PNS.     Recent Visits Date Type Provider Dept  06/07/20 Office Visit Gillis Santa, MD [facility]  04/23/20 Procedure visit Gillis Santa, MD [facility]  04/17/20 Telemedicine Milinda Pointer, MD [facility]  Showing recent visits within past 90 days and meeting all other requirements Today's Visits Date Type Provider Dept  06/27/20 Procedure visit Gillis Santa, MD [facility]  Showing today's visits and meeting all other requirements Future Appointments Date Type Provider Dept  07/11/20 Appointment Gillis Santa, MD [facility]  Showing future appointments within next 90 days and meeting all other requirements  Disposition: Discharge home  Discharge (Date  Time): 06/27/2020; 0930 hrs.   Primary Care Physician: McLean-Scocuzza, Nino Glow, MD Location: [facility] Note by: Gillis Santa, MD Date: 06/27/2020; Time: 9:57 AM  Disclaimer:  Medicine is not an exact science. The only guarantee in medicine is that nothing is guaranteed. It is important to note that the decision to proceed with this intervention was based on the information collected from the patient. The Data and conclusions were drawn from the patient's questionnaire, the interview, and the physical examination. Because the information was  provided in large part by the patient,  it cannot be guaranteed that it has not been purposely or unconsciously manipulated. Every effort has been made to obtain as much relevant data as possible for this evaluation. It is important to note that the conclusions that lead to this procedure are derived in large part from the available data. Always take into account that the treatment will also be dependent on availability of resources and existing treatment guidelines, considered by other Pain Management Practitioners as being common knowledge and practice, at the time of the intervention. For Medico-Legal purposes, it is also important to point out that variation in procedural techniques and pharmacological choices are the acceptable norm. The indications, contraindications, technique, and results of the above procedure should only be interpreted and judged by a Board-Certified Interventional Pain Specialist with extensive familiarity and expertise in the same exact procedure and technique.

## 2020-06-27 NOTE — Patient Instructions (Signed)

## 2020-06-27 NOTE — Progress Notes (Signed)
Safety precautions to be maintained throughout the outpatient stay will include: orient to surroundings, keep bed in low position, maintain call bell within reach at all times, provide assistance with transfer out of bed and ambulation.  

## 2020-06-28 ENCOUNTER — Telehealth: Payer: Self-pay

## 2020-06-28 ENCOUNTER — Telehealth: Payer: Self-pay | Admitting: Internal Medicine

## 2020-06-28 NOTE — Telephone Encounter (Signed)
Filbert Berthold, CMA  06/28/2020 9:54 AM EDT Back to Top    [place], NP  06/27/2020 5:32 PM EDT     Please call her with normal PAP and repeat in 3 years.  Positive yeast- treated. No GC, chlamydia, herpes, trichomonas. She needs a RPR titer annually going forward.    Patient calling back in and informed of results. Patient verbalized understanding

## 2020-06-28 NOTE — Telephone Encounter (Signed)
Post procedure phone call.  LM 

## 2020-07-04 ENCOUNTER — Institutional Professional Consult (permissible substitution): Payer: BC Managed Care – PPO | Admitting: Plastic Surgery

## 2020-07-11 ENCOUNTER — Ambulatory Visit (HOSPITAL_BASED_OUTPATIENT_CLINIC_OR_DEPARTMENT_OTHER): Payer: BC Managed Care – PPO | Admitting: Student in an Organized Health Care Education/Training Program

## 2020-07-11 ENCOUNTER — Ambulatory Visit
Admission: RE | Admit: 2020-07-11 | Discharge: 2020-07-11 | Disposition: A | Discharge status: Home / Self Care | Payer: BC Managed Care – PPO | Source: Ambulatory Visit | Attending: Student in an Organized Health Care Education/Training Program | Admitting: Student in an Organized Health Care Education/Training Program

## 2020-07-11 ENCOUNTER — Other Ambulatory Visit: Payer: Self-pay

## 2020-07-11 ENCOUNTER — Encounter: Payer: Self-pay | Admitting: Student in an Organized Health Care Education/Training Program

## 2020-07-11 VITALS — BP 131/81 | HR 70 | Temp 97.3°F | Resp 18 | Ht 63.0 in | Wt 245.0 lb

## 2020-07-11 DIAGNOSIS — G894 Chronic pain syndrome: Secondary | ICD-10-CM

## 2020-07-11 DIAGNOSIS — M47817 Spondylosis without myelopathy or radiculopathy, lumbosacral region: Secondary | ICD-10-CM | POA: Insufficient documentation

## 2020-07-11 DIAGNOSIS — M5441 Lumbago with sciatica, right side: Secondary | ICD-10-CM | POA: Insufficient documentation

## 2020-07-11 DIAGNOSIS — M47816 Spondylosis without myelopathy or radiculopathy, lumbar region: Secondary | ICD-10-CM | POA: Diagnosis not present

## 2020-07-11 DIAGNOSIS — G8929 Other chronic pain: Secondary | ICD-10-CM | POA: Insufficient documentation

## 2020-07-11 DIAGNOSIS — M5442 Lumbago with sciatica, left side: Secondary | ICD-10-CM | POA: Diagnosis present

## 2020-07-11 MED ORDER — LIDOCAINE HCL 2 % IJ SOLN
INTRAMUSCULAR | Status: AC
  Filled 2020-07-11: qty 20

## 2020-07-11 MED ORDER — ROPIVACAINE HCL 2 MG/ML IJ SOLN
9.0000 mL | Freq: Once | INTRAMUSCULAR | Status: AC
  Administered 2020-07-11: 10 mL via PERINEURAL

## 2020-07-11 MED ORDER — ROPIVACAINE HCL 2 MG/ML IJ SOLN
INTRAMUSCULAR | Status: AC
  Filled 2020-07-11: qty 10

## 2020-07-11 MED ORDER — LIDOCAINE HCL 2 % IJ SOLN
20.0000 mL | Freq: Once | INTRAMUSCULAR | Status: AC
  Administered 2020-07-11: 400 mg

## 2020-07-11 NOTE — Progress Notes (Addendum)
PROVIDER NOTE: Information contained herein reflects review and annotations entered in association with encounter. Interpretation of such information and data should be left to medically-trained personnel. Information provided to patient can be located elsewhere in the medical record under "Patient Instructions". Document created using STT-dictation technology, any transcriptional errors that may result from process are unintentional.    Patient: Anita Gibbs  Service Category: Procedure  Provider: Gillis Santa, MD  DOB: 06-11-1971  DOS: 07/11/2020  Location: [facility]  MRN: [id number]  Setting: Ambulatory - outpatient  Referring Provider: McLean-Scocuzza, Olivia Mackie *  Type: Established Patient  Specialty: Interventional Pain Management  PCP: McLean-Scocuzza, Nino Glow, MD   Primary Reason for Visit: Interventional Pain Management Treatment. CC: Back Pain (low)  Procedure:          Anesthesia, Analgesia, Anxiolysis:  Sprint peripheral nerve stimulation of LEFT L4 medial branch nerve for low back pain related to lumbar facet arthropathy under fluoroscopy and ultrasound guidance  Type: Local Anesthesia   Local Anesthetic: Lidocaine 1-2%  Position: Prone   Indications: 1. Spondylosis without myelopathy or radiculopathy, lumbosacral region   2. Lumbar facet syndrome (Bilateral) (R>L)   3. Lumbar facet arthropathy   4. Chronic low back pain [facility]) (Bilateral) (R>L) w/ sciatica (Bilateral)   5. Chronic pain syndrome    Pain Score: Pre-procedure: 3 /10 Post-procedure: 4/10   Pre-op Assessment:  Anita Gibbs is a 49 y.o. (year old), female patient, seen today for interventional treatment. She  has a past surgical history that includes Knee arthroscopy (Right) and Anterior cervical decomp/discectomy fusion (N/A, 07/01/2017). Anita Gibbs has a current medication list which includes the following prescription(s): albuterol, amlodipine, atorvastatin, cetirizine hcl,  vitamin d-3, diphenhydramine, epinephrine, fluticasone, hydrochlorothiazide, hydrocortisone, losartan, meloxicam, methocarbamol, montelukast, norethindrone, olopatadine hcl, pantoprazole, and phentermine. Her primarily concern today is the Back Pain (low)  Initial Vital Signs:  Pulse/HCG Rate: 70ECG Heart Rate: 64 Temp: (!) 97.3 F (36.3 C) Resp: 18 BP: 125/68 SpO2: 100 %  BMI: Estimated body mass index is 43.4 kg/m as calculated from the following:   Height as of this encounter: 5\' 3"  (1.6 m).   Weight as of this encounter: 245 lb (111.1 kg).  Risk Assessment: Allergies: Reviewed. She is allergic to other, shellfish allergy, strawberry (diagnostic), and tomato.  Allergy Precautions: None required Coagulopathies: Reviewed. None identified.  Blood-thinner therapy: None at this time Active Infection(s): Reviewed. None identified. Anita Gibbs is afebrile  Site Confirmation: Anita Gibbs was asked to confirm the procedure and laterality before marking the site Procedure checklist: Completed Consent: Before the procedure and under the influence of no sedative(s), amnesic(s), or anxiolytics, the patient was informed of the treatment options, risks and possible complications. To fulfill our ethical and legal obligations, as recommended by the American Medical Association's Code of Ethics, I have informed the patient of my clinical impression; the nature and purpose of the treatment or procedure; the risks, benefits, and possible complications of the intervention; the alternatives, including doing nothing; the risk(s) and benefit(s) of the alternative treatment(s) or procedure(s); and the risk(s) and benefit(s) of doing nothing. The patient was provided information about the general risks and possible complications associated with the procedure. These may include, but are not limited to: failure to achieve desired goals, infection, bleeding, organ or nerve damage, allergic reactions, paralysis, and  death. In addition, the patient was informed of those risks and complications associated to Spine-related procedures, such as failure to decrease pain; infection (i.e.: Meningitis, epidural or  intraspinal abscess); bleeding (i.e.: epidural hematoma, subarachnoid hemorrhage, or any other type of intraspinal or peri-dural bleeding); organ or nerve damage (i.e.: Any type of peripheral nerve, nerve root, or spinal cord injury) with subsequent damage to sensory, motor, and/or autonomic systems, resulting in permanent pain, numbness, and/or weakness of one or several areas of the body; allergic reactions; (i.e.: anaphylactic reaction); and/or death. Furthermore, the patient was informed of those risks and complications associated with the medications. These include, but are not limited to: allergic reactions (i.e.: anaphylactic or anaphylactoid reaction(s)); adrenal axis suppression; blood sugar elevation that in diabetics may result in ketoacidosis or comma; water retention that in patients with history of congestive heart failure may result in shortness of breath, pulmonary edema, and decompensation with resultant heart failure; weight gain; swelling or edema; medication-induced neural toxicity; particulate matter embolism and blood vessel occlusion with resultant organ, and/or nervous system infarction; and/or aseptic necrosis of one or more joints. Finally, the patient was informed that Medicine is not an exact science; therefore, there is also the possibility of unforeseen or unpredictable risks and/or possible complications that may result in a catastrophic outcome. The patient indicated having understood very clearly. We have given the patient no guarantees and we have made no promises. Enough time was given to the patient to ask questions, all of which were answered to the patient's satisfaction. Anita Gibbs has indicated that she wanted to continue with the procedure. Attestation: I, the ordering provider,  attest that I have discussed with the patient the benefits, risks, side-effects, alternatives, likelihood of achieving goals, and potential problems during recovery for the procedure that I have provided informed consent. Date  Time: 07/11/2020  7:50 AM  Pre-Procedure Preparation:  Monitoring: As per clinic protocol. Respiration, ETCO2, SpO2, BP, heart rate and rhythm monitor placed and checked for adequate function Safety Precautions: Patient was assessed for positional comfort and pressure points before starting the procedure. Time-out: I initiated and conducted the "Time-out" before starting the procedure, as per protocol. The patient was asked to participate by confirming the accuracy of the "Time Out" information. Verification of the correct person, site, and procedure were performed and confirmed by me, the nursing staff, and the patient. "Time-out" conducted as per Joint Commission's Universal Protocol (UP.01.01.01). Time: 0832  Description of Procedure:          Sprint peripheral nerve stimulation LEFT L4 medial branch nerve  After the risks, benefits and alternatives were discussed  with the patient and informed consent was obtained, patient  was placed in the prone position and padded to foster comfort.  Prior to delivery of anesthetics, the painful region was carefully  outlined with a marker. Appropriate skin and bony landmarks  were identified, and pertinent vascular structures were located.   The skin overlying the lumbosacral spine was prepped and  draped in sterile fashion. Fluoroscopy was used to identify the spinous process and lamina in the center of the  patient's region of pain. After identifying and marking the intended target along the course of the medial branch nerve,  the skin around the planned entry point and the subcutaneous tissues are injected with local anesthetic.  An introducer needle and stimulating probe were  assembled, inserted and advanced along the intended  course of the medial branch nerve as it traverses the lamina medial and inferior to the zygapophyseal joint, taking care to maintain the proper depth of insertion as the introducer is advanced under fluoroscopic. The introducer needle was delivered to a location in  proximity to the nerve.  Multiple stimulation parameters were used to deliver stimulation to the medial branch nerve in concert with stimulating at multiple positions around the nerve. Nerve target  acquisition was confirmed noting generation of paresthesias in the paravertebral regions corresponding to the level being stimulated as well as rhythmic thumping within the multifidi, the  latter being further corroborated via palpation.   Various electrical parameter combinations were tested, and the lead location was adjusted (physically relocated) until the patient  indicated paresthesia or muscle tension overlapping the distribution of the patient's typical region of pain.   The stimulating probe was removed from the introducer and a percutaneous lead was guided through the needle and delivered to a location in similar proximity to the nerve. Final  location was verified with electrical stimulation and documented with fluoroscopy & ultrasound.   The introducer needle was removed, and the exposed end of the percutaneous lead was attached to an external stimulator unit. Various electrical parameter combinations were again  tested until the patient indicated paresthesia or muscle tension  overlapping the distribution of the patient's typical region of pain.   After confirming that lead impedance was in the normal range, the external unit was detached, the needle was removed, and the lead was anchored at the skin.  The lead was threaded into the connector block and electrical continuity and desired patient response was confirmed. The connector block was attached to the external  stimulator unit.The site was covered with a sterile occlusive  dressing and  a fluoroscopic and ultrasound image was taken to document final placement. The patient was observed for stability of vital signs and comfort.      Illustration of the posterior view of the lumbar spine and the posterior neural structures. Laminae of L2 through S1 are labeled. DPRL5, dorsal primary ramus of L5; DPRS1, dorsal primary ramus of S1; DPR3, dorsal primary ramus of L3; FJ, facet (zygapophyseal) joint L3-L4; I, inferior articular process of L4; LB1, lateral branch of dorsal primary ramus of L1; IAB, inferior articular branches from L3 medial branch (supplies L4-L5 facet joint); IBP, intermediate branch plexus; MB3, medial branch of dorsal primary ramus of L3; NR3, third lumbar nerve root; S, superior articular process of L5; SAB, superior articular branches from L4 (supplies L4-5 facet joint also); TP3, transverse process of L3.  Vitals:   07/11/20 0803 07/11/20 0833 07/11/20 0838 07/11/20 0843  BP: 125/68 133/81 129/79 131/81  Pulse: 70     Resp: 18 18 20 18   Temp: (!) 97.3 F (36.3 C)     TempSrc: Temporal     SpO2: 100% 99% 99% 97%  Weight: 245 lb (111.1 kg)     Height: 5\' 3"  (1.6 m)        Start Time: 0832 hrs. End Time: 0844 hrs.  Imaging Guidance (Spinal):          Type of Imaging Technique: Fluoroscopy Guidance (Spinal) and ultrasound guidance to confirm multifidus of lumbar paraspinal muscle activation once lead was placed Indication(s): Assistance in needle guidance and placement for procedures requiring needle placement in or near specific anatomical locations not easily accessible without such assistance. Exposure Time: Please see nurses notes. Contrast: None used. Fluoroscopic Guidance: I was personally present during the use of fluoroscopy. "Tunnel Vision Technique" used to obtain the best possible view of the target area. Parallax error corrected before commencing the procedure. "Direction-depth-direction" technique used to introduce the needle under  continuous pulsed fluoroscopy. Once target was reached, antero-posterior, oblique, and  lateral fluoroscopic projection used confirm needle placement in all planes. Images permanently stored in EMR. Interpretation: No contrast injected. I personally interpreted the imaging intraoperatively. Adequate needle placement confirmed in multiple planes. Permanent images saved into the patient's record.  Antibiotic Prophylaxis:   Anti-infectives (From admission, onward)   None     Indication(s): None identified  Post-operative Assessment:  Post-procedure Vital Signs:  Pulse/HCG Rate: 7065 Temp: (!) 97.3 F (36.3 C) Resp: 18 BP: 131/81 SpO2: 97 %  EBL: None  Complications: No immediate post-treatment complications observed by team, or reported by patient.  Note: The patient tolerated the entire procedure well. A repeat set of vitals were taken after the procedure and the patient was kept under observation following institutional policy, for this type of procedure. Post-procedural neurological assessment was performed, showing return to baseline, prior to discharge. The patient was provided with post-procedure discharge instructions, including a section on how to identify potential problems. Should any problems arise concerning this procedure, the patient was given instructions to immediately contact us, at any time, without hesitation. In any case, we plan to contact the patient by telephone for a follow-up status report regarding this interventional procedure.  Comments:  No additional relevant information.  Plan of Care  Orders:  Orders Placed This Encounter  Procedures  . DG PA[facility] C-ARM 1-60 MIN NO REPORT    Intraoperative interpretation by procedural physician at [place].    Standing Status:   Standing    Number of Occurrences:   1    Order Specific Question:   Reason for exam:    Answer:   Assistance in needle guidance and placement for procedures requiring needle  placement in or near specific anatomical locations not easily accessible without such assistance.   Medications ordered for procedure: Meds ordered this encounter  Medications  . lidocaine (XYLOCAINE) 2 % (with pres) injection 400 mg  . ropivacaine (PF) 2 mg/mL (0.2%) (NAROPIN) injection 9 mL   Medications administered: We administered lidocaine and ropivacaine (PF) 2 mg/mL (0.2%).  See the medical record for exact dosing, route, and time of administration.  Follow-up plan:   Return in about 8 weeks (around 09/03/2020) for PNS Lead pull.     Recent Visits Date Type Provider Dept  06/27/20 Procedure visit Gillis Santa, MD [facility]  06/07/20 Office Visit Gillis Santa, MD [facility]  04/23/20 Procedure visit Gillis Santa, MD [facility]  04/17/20 Telemedicine Milinda Pointer, MD [facility]  Showing recent visits within past 90 days and meeting all other requirements Today's Visits Date Type Provider Dept  07/11/20 Procedure visit Gillis Santa, MD [facility]  Showing today's visits and meeting all other requirements Future Appointments No visits were found meeting these conditions. Showing future appointments within next 90 days and meeting all other requirements  Disposition: Discharge home  Discharge (Date  Time): 07/11/2020; 0900 hrs.   Primary Care Physician: McLean-Scocuzza, Nino Glow, MD Location: [facility] Note by: Gillis Santa, MD Date: 07/11/2020; Time: 9:01 AM  Disclaimer:  Medicine is not an exact science. The only guarantee in medicine is that nothing is guaranteed. It is important to note that the decision to proceed with this intervention was based on the information collected from the patient. The Data and conclusions were drawn from the patient's questionnaire, the interview, and the physical examination. Because the information was provided in large part by the patient, it  cannot be guaranteed that it has not  been purposely or unconsciously manipulated. Every effort has been made to obtain as much relevant data as possible for this evaluation. It is important to note that the conclusions that lead to this procedure are derived in large part from the available data. Always take into account that the treatment will also be dependent on availability of resources and existing treatment guidelines, considered by other Pain Management Practitioners as being common knowledge and practice, at the time of the intervention. For Medico-Legal purposes, it is also important to point out that variation in procedural techniques and pharmacological choices are the acceptable norm. The indications, contraindications, technique, and results of the above procedure should only be interpreted and judged by a Board-Certified Interventional Pain Specialist with extensive familiarity and expertise in the same exact procedure and technique.

## 2020-07-11 NOTE — Progress Notes (Signed)
Safety precautions to be maintained throughout the outpatient stay will include: orient to surroundings, keep bed in low position, maintain call bell within reach at all times, provide assistance with transfer out of bed and ambulation.  

## 2020-07-12 ENCOUNTER — Telehealth: Payer: Self-pay

## 2020-07-12 NOTE — Telephone Encounter (Signed)
Post procedure follow up. Patient states she is doing fine.  

## 2020-07-18 ENCOUNTER — Encounter: Payer: Self-pay | Admitting: Plastic Surgery

## 2020-07-18 ENCOUNTER — Ambulatory Visit (INDEPENDENT_AMBULATORY_CARE_PROVIDER_SITE_OTHER): Payer: BC Managed Care – PPO | Admitting: Plastic Surgery

## 2020-07-18 ENCOUNTER — Other Ambulatory Visit: Payer: Self-pay

## 2020-07-18 VITALS — BP 136/71 | HR 101 | Temp 98.3°F | Ht 63.0 in | Wt 246.0 lb

## 2020-07-18 DIAGNOSIS — M546 Pain in thoracic spine: Secondary | ICD-10-CM | POA: Diagnosis not present

## 2020-07-18 DIAGNOSIS — M545 Low back pain, unspecified: Secondary | ICD-10-CM

## 2020-07-18 DIAGNOSIS — N62 Hypertrophy of breast: Secondary | ICD-10-CM | POA: Diagnosis not present

## 2020-07-18 DIAGNOSIS — M4004 Postural kyphosis, thoracic region: Secondary | ICD-10-CM

## 2020-07-18 NOTE — Progress Notes (Signed)
Referring Provider McLean-Scocuzza, Nino Glow, MD [place],  [place] [address]   CC:  Chief Complaint  Patient presents with  . Consult      Anita Gibbs is an 49 y.o. female.  HPI: Patient presents to discuss breast reduction. She had years of back pain, neck pain and shoulder grooving related to her large breast. She has had back operations and still has chronic back neck and shoulder pain. She sees a pain specialist and has a nerve stimulator that helps a little bit. She is tried over-the-counter medications, warm packs and cold packs but has not gotten any sustained relief of her back pain. She also gets rashes beneath her breast that have been treated with over-the-counter medications with little relief. She has an aunt with breast cancer. She has not had any previous breast procedures or biopsies. She is up-to-date on her mammograms and is not due again until the end of January of next year. She is currently a 73 8H and would like to be proportional. She does not smoke and is not diabetic.  Allergies  Allergen Reactions  . Other Hives    White and red sauces  . Shellfish Allergy Hives  . Strawberry (Diagnostic) Hives  . Tomato Hives    cherry    Outpatient Encounter Medications as of 07/18/2020  Medication Sig Note  . albuterol (PROVENTIL HFA;VENTOLIN HFA) 108 (90 Base) MCG/ACT inhaler Inhale 2 puffs into the lungs every 6 (six) hours as needed for wheezing or shortness of breath.   Marland Kitchen amLODipine (NORVASC) 5 MG tablet Take 1 tablet (5 mg total) by mouth daily.   Marland Kitchen atorvastatin (LIPITOR) 10 MG tablet Take 1 tablet (10 mg total) by mouth daily at 6 PM.   . Cetirizine HCl (ZYRTEC PO) Take 25 mg by mouth. 05/10/2019: Taking BID  . Cholecalciferol (VITAMIN D-3) 125 MCG (5000 UT) TABS Take by mouth daily.   . diphenhydrAMINE (BENADRYL) 25 mg capsule Take 25 mg by mouth every 6 (six) hours as needed (for allergies.).   Marland Kitchen EPINEPHrine (AUVI-Q) 0.3 mg/0.3 mL IJ SOAJ  injection Use as directed for severe allergic reaction   . fluticasone (FLONASE) 50 MCG/ACT nasal spray Place 2 sprays into both nostrils daily.   . hydrochlorothiazide (HYDRODIURIL) 25 MG tablet Take 0.5 tablets (12.5 mg total) by mouth daily. In am   . hydrocortisone 2.5 % cream Apply topically 2 (two) times daily. Prn to eyelids   . losartan (COZAAR) 100 MG tablet Take 1 tablet (100 mg total) by mouth daily.   . meloxicam (MOBIC) 15 MG tablet Take 1 tablet (15 mg total) by mouth daily.   . montelukast (SINGULAIR) 10 MG tablet Take 1 tablet (10 mg total) by mouth at bedtime.   . norethindrone (AYGESTIN) 5 MG tablet 2 (two) times daily.    . Olopatadine HCl 0.2 % SOLN Apply 1 drop to eye daily. Both eyes   . pantoprazole (PROTONIX) 40 MG tablet Take 1 tablet (40 mg total) by mouth daily. 30 min. Before dinner   . phentermine (ADIPEX-P) 37.5 MG tablet Take 1 tablet (37.5 mg total) by mouth daily before breakfast. rx 1/2   . methocarbamol (ROBAXIN) 750 MG tablet Take 1 tablet (750 mg total) by mouth every 8 (eight) hours as needed for muscle spasms.    No facility-administered encounter medications on file as of 07/18/2020.     Past Medical History:  Diagnosis Date  . Arthritis   . Carpal boss, right   .  Carpal tunnel syndrome, bilateral   . Headache   . Hypertension   . Seasonal asthma 11/29/2018    Past Surgical History:  Procedure Laterality Date  . ANTERIOR CERVICAL DECOMP/DISCECTOMY FUSION N/A 07/01/2017   Procedure: ANTERIOR CERVICAL DECOMPRESSION/DISCECTOMY FUSION 3 LEVELS;  Surgeon: Meade Maw, MD;  Location: [facility] ORS;  Service: Neurosurgery;  Laterality: N/A;  . KNEE ARTHROSCOPY Right     Family History  Problem Relation Age of Onset  . [name] Mother   . Diabetes Mother   . Arthritis Mother   . Hypertension Mother   . Hyperlipidemia Mother   . Hypertension Father   . Arthritis Father   . Hearing loss Father   . Kidney disease Father   . Lymphoma Father    . Arthritis Sister   . Hypertension Sister   . Cancer Brother        ? lung due to exposure   . COPD Brother   . Early death Brother   . COPD Son   . Depression Son   . Breast cancer Maternal Aunt   . Breast cancer Paternal Aunt   . Sleep apnea Daughter   . Allergic rhinitis Neg Hx   . Angioedema Neg Hx   . Eczema Neg Hx   . Urticaria Neg Hx   . Asthma Neg Hx     Social History   Social History Narrative   Bus driver    And school custodian    1 daughter lives in [place] 64 y.o as of 01/17/20   1 son 24 almost 73 in [place]: Denies fevers, chills, weight loss CV: Denies chest pain, shortness of breath, palpitations  Physical Exam Vitals with BMI 07/18/2020 07/11/2020 07/11/2020  Height 5\' 3"  - -  Weight 246 lbs - -  BMI 20.23 - -  Systolic 343 568 616  Diastolic 71 81 79  Pulse 837 - -    General:  No acute distress,  Alert and oriented, Non-Toxic, Normal speech and affect Breast: She has grade 3 ptosis. Sternal notch to nipple is 39 cm on the right and 38 cm on the left. Nipple to fold is 20 cm bilaterally. I do not see any obvious scars or masses.  Assessment/Plan The patient has bilateral symptomatic macromastia.  She is a good candidate for a breast reduction.  She is interested in pursuing surgical treatment.  She has tried supportive garments and fitted bras with no relief.  The details of breast reduction surgery were discussed.  I explained the procedure in detail along the with the expected scars.  The risks were discussed in detail and include bleeding, infection, damage to surrounding structures, need for additional procedures, nipple loss, change in nipple sensation, persistent pain, contour irregularities and asymmetries.  I explained that breast feeding is often not possible after breast reduction surgery.  We discussed the expected postoperative course with an overall recovery period of about 1 month.  She demonstrated  full understanding of all risks.  We discussed her personal risk factors that include her BMI.  I anticipate approximately 1000g of tissue removed from each side.   [name] 07/18/2020, 10:40 AM

## 2020-07-23 ENCOUNTER — Telehealth: Payer: Self-pay | Admitting: Nurse Practitioner

## 2020-07-23 NOTE — Telephone Encounter (Signed)
Please call her and give her lab appt to complete the Bmet ordered in Oct to f/up on elevated creatinine.

## 2020-07-24 NOTE — Telephone Encounter (Signed)
Patient already has lab appt for 08/14/20 at 8:45 am.

## 2020-07-31 ENCOUNTER — Telehealth: Payer: Self-pay | Admitting: Plastic Surgery

## 2020-07-31 NOTE — Telephone Encounter (Signed)
Called patient regarding [employer] denial for surgery. Explained that [place] is stating the patient must participate in a medically supervised weight loss program for at least 3 months. I explained this is common for [employer], as they like for the BMI to be close to 27 - or proof of weight loss efforts. We will put in a referral to weight management. Patient expressed understanding and agreed to the plan.

## 2020-07-31 NOTE — Progress Notes (Signed)
Name: Anita Gibbs  MRN/ DOB: [id number], Jul 26, 1971    Age/ Sex: 49 y.o., female    PCP: McLean-Scocuzza, Anita Glow, MD   Reason for Endocrinology Evaluation: Adrenal Adenoma     Date of Initial Endocrinology Evaluation: 07/31/2020     HPI: Anita Gibbs is a 49 y.o. female with a past medical history of HTN and dyslipidemia. The patient presented for initial endocrinology clinic visit on 07/31/2020 for consultative assistance with her Adrenal Adenoma.   She had an incidental left adrenal adenoma on a CT scan in 06/2020 during evaluation for abdominal pain that measures 1.4 x 1.2 cm and 48 Hounsfield units.   Substantial weight gain- yes started this year  Centripetal obesity- no Easy bruisbility- no Severe hypertension-no DM- no Proximal muscle weakness-yes she attributes this to knee and back pain Fatigue-  yes Sudden/ severe headaches- occasionally  Weight loss-no Anxiety attacks- yes Sweating- yes Cardiac arrhythmias- no  Palpitations- yes  Fluid retention- no Hypokalemia- yes       No FH of adrenal problems Mother and sister with DM     HISTORY:  Past Medical History:  Past Medical History:  Diagnosis Date  . Arthritis   . Carpal boss, right   . Carpal tunnel syndrome, bilateral   . Headache   . Hypertension   . Seasonal asthma 11/29/2018    Past Surgical History:  Past Surgical History:  Procedure Laterality Date  . ANTERIOR CERVICAL DECOMP/DISCECTOMY FUSION N/A 07/01/2017   Procedure: ANTERIOR CERVICAL DECOMPRESSION/DISCECTOMY FUSION 3 LEVELS;  Surgeon: Anita Maw, MD;  Location: [facility];  Service: Neurosurgery;  Laterality: N/A;  . KNEE ARTHROSCOPY Right       Social History:  reports that she has never smoked. She has never used smokeless tobacco. She reports current alcohol use. She reports that she does not use drugs.  Family History: family history includes Anita Gibbs in her mother; Arthritis in her father, mother, and  sister; Breast cancer in her maternal aunt and paternal aunt; COPD in her brother and son; Cancer in her brother; Depression in her son; Diabetes in her mother; Early death in her brother; Hearing loss in her father; Hyperlipidemia in her mother; Hypertension in her father, mother, and sister; Kidney disease in her father; Lymphoma in her father; Sleep apnea in her daughter.   HOME MEDICATIONS: Allergies as of 08/01/2020      Reactions   Other Hives   White and red sauces   Shellfish Allergy Hives   Strawberry (diagnostic) Hives   Tomato Hives   cherry      Medication List       Accurate as of July 31, 2020 10:36 AM. If you have any questions, ask your nurse or doctor.        albuterol 108 (90 Base) MCG/ACT inhaler Commonly known as: VENTOLIN HFA Inhale 2 puffs into the lungs every 6 (six) hours as needed for wheezing or shortness of breath.   amLODipine 5 MG tablet Commonly known as: NORVASC Take 1 tablet (5 mg total) by mouth daily.   atorvastatin 10 MG tablet Commonly known as: Lipitor Take 1 tablet (10 mg total) by mouth daily at 6 PM.   diphenhydrAMINE 25 mg capsule Commonly known as: BENADRYL Take 25 mg by mouth every 6 (six) hours as needed (for allergies.).   EPINEPHrine 0.3 mg/0.3 mL Soaj injection Commonly known as: Auvi-Q Use as directed for severe allergic reaction   fluticasone 50 MCG/ACT nasal spray Commonly known  as: FLONASE Place 2 sprays into both nostrils daily.   hydrochlorothiazide 25 MG tablet Commonly known as: HYDRODIURIL Take 0.5 tablets (12.5 mg total) by mouth daily. In am   hydrocortisone 2.5 % cream Apply topically 2 (two) times daily. Prn to eyelids   losartan 100 MG tablet Commonly known as: COZAAR Take 1 tablet (100 mg total) by mouth daily.   meloxicam 15 MG tablet Commonly known as: MOBIC Take 1 tablet (15 mg total) by mouth daily.   methocarbamol 750 MG tablet Commonly known as: ROBAXIN Take 1 tablet (750 mg total) by  mouth every 8 (eight) hours as needed for muscle spasms.   montelukast 10 MG tablet Commonly known as: SINGULAIR Take 1 tablet (10 mg total) by mouth at bedtime.   norethindrone 5 MG tablet Commonly known as: AYGESTIN 2 (two) times daily.   Olopatadine HCl 0.2 % Soln Apply 1 drop to eye daily. Both eyes   pantoprazole 40 MG tablet Commonly known as: Protonix Take 1 tablet (40 mg total) by mouth daily. 30 min. Before dinner   phentermine 37.5 MG tablet Commonly known as: Adipex-P Take 1 tablet (37.5 mg total) by mouth daily before breakfast. rx 1/2   Vitamin D-3 125 MCG (5000 UT) Tabs Take by mouth daily.   ZYRTEC PO Take 25 mg by mouth.         REVIEW OF SYSTEMS: A comprehensive ROS was conducted with the patient and is negative except as per HPI and below:  ROS     OBJECTIVE:  VS: There were no vitals taken for this visit.   Wt Readings from Last 3 Encounters:  07/18/20 246 lb (111.6 kg)  07/11/20 245 lb (111.1 kg)  06/27/20 245 lb (111.1 kg)     EXAM: General: Pt appears well and is in NAD  Neck: General: Supple without adenopathy. Thyroid: Thyroid size normal.  No goiter or nodules appreciated. No thyroid bruit.  Lungs: Clear with good BS bilat with no rales, rhonchi, or wheezes  Heart: Auscultation: RRR.  Abdomen: Normoactive bowel sounds, soft, nontender, without masses or organomegaly palpable  Extremities:  BL LE: No pretibial edema normal ROM and strength.  Skin: Hair: Texture and amount normal with gender appropriate distribution Skin Inspection: No rashes Skin Palpation: Skin temperature, texture, and thickness normal to palpation  Neuro: Cranial nerves: II - XII grossly intact  Cerebellar: Normal coordination and movement; no tremor Motor: Normal strength throughout DTRs: 2+ and symmetric in UE without delay in relaxation phase  Mental Status: Judgment, insight: Intact Memory: Intact for recent and remote events Mood and affect: No  depression, anxiety, or agitation     DATA REVIEWED:  Results for Anita, Gibbs (MRN [id number]) as of 08/01/2020 14:07  Ref. Range 08/01/2020 09:59  Sodium Latest Ref Range: 135 - 145 mEq/L 136  Potassium Latest Ref Range: 3.5 - 5.1 mEq/L 3.8  Chloride Latest Ref Range: 96 - 112 mEq/L 100  CO2 Latest Ref Range: 19 - 32 mEq/L 29  Glucose Latest Ref Range: 70 - 99 mg/dL 107 (H)  BUN Latest Ref Range: 6 - 23 mg/dL 14  Creatinine Latest Ref Range: 0.40 - 1.20 mg/dL 0.95  Calcium Latest Ref Range: 8.4 - 10.5 mg/dL 9.8  GFR Latest Ref Range: >60.00 mL/min 71.57       CT abdomen 06/12/2020 Lower chest: No acute abnormality.  Hepatobiliary: No focal liver abnormality is seen. No gallstones, gallbladder wall thickening, or biliary dilatation.  Pancreas: Unremarkable. No pancreatic ductal dilatation  or surrounding inflammatory changes.  Spleen: Normal in size without focal abnormality.  Adrenals/Urinary Tract: Normal right adrenal gland. Left adrenal nodule measures 1.4 x 1.2 cm and 48 Hounsfield units.  No kidney stone. No mass or hydronephrosis identified bilaterally. Small cyst is noted within the upper pole of the left kidney measuring 1 x 1.2 cm. Urinary bladder is unremarkable.  Stomach/Bowel: Stomach is nondistended. No bowel wall thickening, inflammation or distension. The appendix is visualized and appears normal.  Vascular/Lymphatic: Normal appearance of the abdominal aorta. No abdominal or pelvic adenopathy.  Reproductive: There is an enlarged fibroid uterus the largest fibroid arises from the left side of uterine fundus extending into the lower abdomen. This measures approximately 5.9 cm. No adnexal mass identified.  Other: No free fluid or fluid collections identified within the abdomen or pelvis. Fat containing umbilical hernia noted.  Musculoskeletal: No acute or significant osseous findings.  IMPRESSION: 1. No acute findings identified within  the abdomen or pelvis. The appendix is visualized and appears normal. 2. Enlarged fibroid uterus. 3. Indeterminate left adrenal nodule measures 1.4 cm. In a patient who has no history of malignancy this is probably benign and a 12 month follow-up adrenal protocol CT may be considered. In a patient with a history of known malignancy further evaluation with adrenal protocol CT is recommended at this time. This recommendation follows ACR consensus guidelines: Management of Incidental Adrenal Masses: A White Paper of the ACR Incidental Findings Committee. J Am Coll Radiol 2017;14:1038-1044.  ASSESSMENT/PLAN/RECOMMENDATIONS:   1. Left adrenal Adenoma:   - Eighty five percent of adrenal adenomas are nonsecretory.  - Three forms of adrenal hyperfunction should be considered in patients with adrenal incidentaloma   Glucocorticoid hypersecretion  Primary hyperaldosteronism  Pheochromocytoma  TESTING: 1. 24-urine collection for cortisol  2. Aldo: renin 3. 24-hr urine collection for  metanephrine  Will repeat adrenal imaging in 6 months    F/U in 6 months     Signed electronically by: Mack Guise, MD  [facility]  [address] [place], [place] [address] Phone: [phone] FAX: [phone]   CC: McLean-Scocuzza, Anita Glow, MD [place] [address] Phone: [phone] Fax: [phone]   Return to Endocrinology clinic as below: Future Appointments  Date Time Provider [place]  08/01/2020  9:30 AM Makailey Hodgkin, Melanie Crazier, MD [facility] None  08/14/2020  8:45 AM [facility]  09/03/2020  9:00 AM Gillis Santa, MD [facility] None  09/05/2020  9:30 AM McLean-Scocuzza, Anita Glow, MD [facility]

## 2020-08-01 ENCOUNTER — Ambulatory Visit: Payer: BC Managed Care – PPO | Admitting: Internal Medicine

## 2020-08-01 ENCOUNTER — Other Ambulatory Visit: Payer: Self-pay

## 2020-08-01 ENCOUNTER — Encounter: Payer: Self-pay | Admitting: Internal Medicine

## 2020-08-01 VITALS — BP 134/76 | HR 87 | Ht 63.0 in | Wt 248.2 lb

## 2020-08-01 DIAGNOSIS — D3502 Benign neoplasm of left adrenal gland: Secondary | ICD-10-CM

## 2020-08-01 HISTORY — DX: Benign neoplasm of left adrenal gland: D35.02

## 2020-08-01 LAB — BASIC METABOLIC PANEL
BUN: 14 mg/dL (ref 6–23)
CO2: 29 mEq/L (ref 19–32)
Calcium: 9.8 mg/dL (ref 8.4–10.5)
Chloride: 100 mEq/L (ref 96–112)
Creatinine, Ser: 0.95 mg/dL (ref 0.40–1.20)
GFR: 71.57 mL/min (ref >60.00)
Glucose, Bld: 107 mg/dL — ABNORMAL HIGH (ref 70–99)
Potassium: 3.8 mEq/L (ref 3.5–5.1)
Sodium: 136 mEq/L (ref 135–145)

## 2020-08-01 NOTE — Patient Instructions (Signed)

## 2020-08-02 ENCOUNTER — Telehealth: Payer: Self-pay

## 2020-08-02 DIAGNOSIS — R1031 Right lower quadrant pain: Secondary | ICD-10-CM

## 2020-08-02 DIAGNOSIS — R399 Unspecified symptoms and signs involving the genitourinary system: Secondary | ICD-10-CM

## 2020-08-02 DIAGNOSIS — B379 Candidiasis, unspecified: Secondary | ICD-10-CM

## 2020-08-02 NOTE — Telephone Encounter (Signed)
Left message to return call. Need to triage symptoms and see if Patient is agreeable to telephone consult fee. This fee is for all labs and medications ordered outside of a visit. Patient would need to agree to this fee and schedule to come in for a urine lab before anything could be sent in.

## 2020-08-02 NOTE — Telephone Encounter (Signed)
Pt states that she has a UTI. She states that it never fully went away from last time. I offered her an appt with someone and she states that she only wants to see Dr Kelly Services and wants to know if she can just call her something in to the pharmacy. Please advise

## 2020-08-02 NOTE — Telephone Encounter (Signed)
Spoke with the Patient and she is agreeable to consultation phone fee.  She has been scheduled to come in for urine test 12/03 at 9:45 am   Patient states that she has been having symptoms since Monday. She saw kin mills 06/18/20 and was diagnosed with yeast infection and UTI. She states the UTI symptoms got better but never went away. They have started to worsen as off this past Monday.  Patient informed and verbalized understanding that medication would not be sent in until urine was collected tomorrow. Lab orders have been placed.

## 2020-08-03 ENCOUNTER — Other Ambulatory Visit: Payer: Self-pay

## 2020-08-03 ENCOUNTER — Other Ambulatory Visit (INDEPENDENT_AMBULATORY_CARE_PROVIDER_SITE_OTHER): Payer: BC Managed Care – PPO

## 2020-08-03 ENCOUNTER — Other Ambulatory Visit (HOSPITAL_COMMUNITY)
Admission: RE | Admit: 2020-08-03 | Discharge: 2020-08-03 | Disposition: A | Discharge status: Home / Self Care | Payer: BC Managed Care – PPO | Source: Ambulatory Visit | Attending: Internal Medicine | Admitting: Internal Medicine

## 2020-08-03 ENCOUNTER — Other Ambulatory Visit: Payer: Self-pay | Admitting: Internal Medicine

## 2020-08-03 ENCOUNTER — Telehealth: Payer: Self-pay | Admitting: Internal Medicine

## 2020-08-03 DIAGNOSIS — R1031 Right lower quadrant pain: Secondary | ICD-10-CM | POA: Insufficient documentation

## 2020-08-03 DIAGNOSIS — B379 Candidiasis, unspecified: Secondary | ICD-10-CM | POA: Insufficient documentation

## 2020-08-03 DIAGNOSIS — Z23 Encounter for immunization: Secondary | ICD-10-CM | POA: Diagnosis not present

## 2020-08-03 DIAGNOSIS — R399 Unspecified symptoms and signs involving the genitourinary system: Secondary | ICD-10-CM | POA: Insufficient documentation

## 2020-08-03 DIAGNOSIS — B373 Candidiasis of vulva and vagina: Secondary | ICD-10-CM

## 2020-08-03 DIAGNOSIS — N3000 Acute cystitis without hematuria: Secondary | ICD-10-CM

## 2020-08-03 DIAGNOSIS — B3731 Acute candidiasis of vulva and vagina: Secondary | ICD-10-CM

## 2020-08-03 MED ORDER — LEVOFLOXACIN 750 MG PO TABS: 750.0000 mg | ORAL_TABLET | Freq: Every day | ORAL | 0 refills | Status: DC

## 2020-08-03 MED ORDER — FLUCONAZOLE 150 MG PO TABS: 150.0000 mg | ORAL_TABLET | Freq: Once | ORAL | 0 refills | Status: AC

## 2020-08-03 NOTE — Telephone Encounter (Signed)
Was urine cytology lab ordered?

## 2020-08-04 LAB — URINALYSIS, ROUTINE W REFLEX MICROSCOPIC
Bilirubin Urine: NEGATIVE
Glucose, UA: NEGATIVE
Hgb urine dipstick: NEGATIVE
Ketones, ur: NEGATIVE
Leukocytes,Ua: NEGATIVE
Nitrite: NEGATIVE
Protein, ur: NEGATIVE
Specific Gravity, Urine: 1.021 (ref 1.001–1.03)
pH: 6.5 (ref 5.0–8.0)

## 2020-08-04 LAB — URINE CULTURE
MICRO NUMBER:: 11274485
SPECIMEN QUALITY:: ADEQUATE

## 2020-08-06 ENCOUNTER — Other Ambulatory Visit: Payer: Self-pay

## 2020-08-06 ENCOUNTER — Other Ambulatory Visit: Payer: Self-pay | Admitting: Internal Medicine

## 2020-08-06 ENCOUNTER — Other Ambulatory Visit: Payer: BC Managed Care – PPO

## 2020-08-06 DIAGNOSIS — B373 Candidiasis of vulva and vagina: Secondary | ICD-10-CM

## 2020-08-06 DIAGNOSIS — D3502 Benign neoplasm of left adrenal gland: Secondary | ICD-10-CM

## 2020-08-06 DIAGNOSIS — B3731 Acute candidiasis of vulva and vagina: Secondary | ICD-10-CM

## 2020-08-06 LAB — URINE CYTOLOGY ANCILLARY ONLY
Bacterial Vaginitis-Urine: NEGATIVE
Candida Urine: POSITIVE — AB
Chlamydia: NEGATIVE
Comment: NEGATIVE
Comment: NEGATIVE
Comment: NORMAL
Neisseria Gonorrhea: NEGATIVE
Trichomonas: NEGATIVE

## 2020-08-06 MED ORDER — FLUCONAZOLE 150 MG PO TABS: 150.0000 mg | ORAL_TABLET | Freq: Once | ORAL | 0 refills | Status: AC

## 2020-08-06 NOTE — Telephone Encounter (Signed)
Just confused b/c it says active and not active collected like other tests do   Thank you

## 2020-08-06 NOTE — Telephone Encounter (Signed)
Yes, but these test want process or result for up to 7-10 days. Because this is not the recommended way to collect.

## 2020-08-07 LAB — CREATININE, URINE, 24 HOUR
Creatinine, 24H Ur: 1708 mg/24 hr (ref 800–1800)
Creatinine, Urine: 87.6 mg/dL

## 2020-08-09 ENCOUNTER — Telehealth: Payer: Self-pay | Admitting: *Deleted

## 2020-08-09 NOTE — Telephone Encounter (Signed)
Patient called requesting her results. Please contact patient [phone]

## 2020-08-10 NOTE — Telephone Encounter (Signed)
Her labs are not all back yet. So no plan until that happens. Please let her know I will be out of the office the last 2 weeks of December and if the labs are not all back then, will have to wait until the next week of January

## 2020-08-10 NOTE — Telephone Encounter (Signed)
Relayed to patient. She verbalized understanding, had no additional questions.

## 2020-08-10 NOTE — Telephone Encounter (Signed)
Spoke with patient. She would like to know what are the next steps regarding her tumor? Please advise. Thank you!

## 2020-08-13 LAB — ALDOSTERONE + RENIN ACTIVITY W/ RATIO
ALDOS/RENIN RATIO: 0.1 (ref 0.0–30.0)
ALDOSTERONE: 5.7 ng/dL (ref 0.0–30.0)
Renin: 45.875 ng/mL/hr — ABNORMAL HIGH (ref 0.167–5.380)

## 2020-08-14 ENCOUNTER — Other Ambulatory Visit: Payer: Self-pay

## 2020-08-14 ENCOUNTER — Other Ambulatory Visit (INDEPENDENT_AMBULATORY_CARE_PROVIDER_SITE_OTHER): Payer: BC Managed Care – PPO

## 2020-08-14 DIAGNOSIS — I10 Essential (primary) hypertension: Secondary | ICD-10-CM

## 2020-08-14 DIAGNOSIS — Z13818 Encounter for screening for other digestive system disorders: Secondary | ICD-10-CM

## 2020-08-14 DIAGNOSIS — R7303 Prediabetes: Secondary | ICD-10-CM | POA: Diagnosis not present

## 2020-08-14 LAB — BASIC METABOLIC PANEL
BUN: 15 mg/dL (ref 6–23)
CO2: 27 mEq/L (ref 19–32)
Calcium: 9.6 mg/dL (ref 8.4–10.5)
Chloride: 105 mEq/L (ref 96–112)
Creatinine, Ser: 0.98 mg/dL (ref 0.40–1.20)
GFR: 68.93 mL/min (ref >60.00)
Glucose, Bld: 106 mg/dL — ABNORMAL HIGH (ref 70–99)
Potassium: 3.9 mEq/L (ref 3.5–5.1)
Sodium: 140 mEq/L (ref 135–145)

## 2020-08-14 LAB — CORTISOL, URINE, FREE
Cortisol (Ur), Free: 27 ug/24 hr (ref 6–42)
Cortisol,F,ug/L,U: 14 ug/L

## 2020-08-14 LAB — METANEPHRINES, URINE, 24 HOUR
Metaneph Total, Ur: 67 ug/L
Metanephrines, 24H Ur: 131 ug/24 hr (ref 36–209)
Normetanephrine, 24H Ur: 458 ug/24 hr (ref 131–612)
Normetanephrine, Ur: 235 ug/L

## 2020-08-14 LAB — HEMOGLOBIN A1C: Hgb A1c MFr Bld: 5.8 % (ref 4.6–6.5)

## 2020-08-15 LAB — HEPATITIS C ANTIBODY
Hepatitis C Ab: NONREACTIVE
SIGNAL TO CUT-OFF: 0.01 (ref <1.00)

## 2020-08-20 ENCOUNTER — Telehealth: Payer: Self-pay | Admitting: Internal Medicine

## 2020-08-20 DIAGNOSIS — B373 Candidiasis of vulva and vagina: Secondary | ICD-10-CM

## 2020-08-20 DIAGNOSIS — B3731 Acute candidiasis of vulva and vagina: Secondary | ICD-10-CM

## 2020-08-20 DIAGNOSIS — N3000 Acute cystitis without hematuria: Secondary | ICD-10-CM

## 2020-08-20 NOTE — Telephone Encounter (Signed)
Spoke with the Patient and she is agreeable to consultation phone fee.  She has been scheduled to come in for urine test 12/03 at 9:45 am   Patient states that she has been having symptoms since Monday. She saw kin mills 06/18/20 and was diagnosed with yeast infection and UTI. She states the UTI symptoms got better but never went away. They have started to worsen as off this past Monday.  Patient informed and verbalized understanding that medication would not be sent in until urine was collected tomorrow. Lab orders have been placed.       Documentation    Thressa Sheller, CMA 2 weeks ago       Left message to return call. Need to triage symptoms and see if Patient is agreeable to telephone consult fee. This fee is for all labs and medications ordered outside of a visit. Patient would need to agree to this fee and schedule to come in for a urine lab before anything could be sent in.       Documentation     Renaldo Fiddler routed conversation to Thressa Sheller, CMA 2 weeks ago   Renaldo Fiddler 2 weeks ago   [place]    Pt states that she has a UTI. She states that it never fully went away from last time. I offered her an appt with someone and she states that she only wants to see Dr Kelly Services and wants to know if she can just call her something in to the pharmacy. Please advise       A/p 08/03/20 telephone call  Unresolved UTI prior yeast infection  Repeat urine  Rx levaquin 750 x 5 days, diflucan UA/Cx  Agreeable consult fee  Time spent 5-10 min  Dr. Olivia Mackie McLean-Scocuzza

## 2020-08-31 ENCOUNTER — Other Ambulatory Visit: Payer: Self-pay | Admitting: Pain Medicine

## 2020-08-31 DIAGNOSIS — G8929 Other chronic pain: Secondary | ICD-10-CM

## 2020-08-31 DIAGNOSIS — M7918 Myalgia, other site: Secondary | ICD-10-CM

## 2020-09-03 ENCOUNTER — Encounter: Payer: Self-pay | Admitting: Student in an Organized Health Care Education/Training Program

## 2020-09-03 ENCOUNTER — Other Ambulatory Visit: Payer: Self-pay

## 2020-09-03 ENCOUNTER — Ambulatory Visit
Payer: BC Managed Care – PPO | Attending: Student in an Organized Health Care Education/Training Program | Admitting: Student in an Organized Health Care Education/Training Program

## 2020-09-03 VITALS — BP 121/87 | HR 81 | Temp 97.5°F | Resp 16 | Ht 63.0 in | Wt 248.0 lb

## 2020-09-03 DIAGNOSIS — M47816 Spondylosis without myelopathy or radiculopathy, lumbar region: Secondary | ICD-10-CM | POA: Insufficient documentation

## 2020-09-03 DIAGNOSIS — M47817 Spondylosis without myelopathy or radiculopathy, lumbosacral region: Secondary | ICD-10-CM | POA: Diagnosis not present

## 2020-09-03 NOTE — Progress Notes (Signed)
PROVIDER NOTE: Information contained herein reflects review and annotations entered in association with encounter. Interpretation of such information and data should be left to medically-trained personnel. Information provided to patient can be located elsewhere in the medical record under "Patient Instructions". Document created using STT-dictation technology, any transcriptional errors that may result from process are unintentional.    Patient: Anita Gibbs  Service Category: E/M  Provider: Gillis Santa, MD  DOB: [birth year]  DOS: 09/03/2020  Specialty: Interventional Pain Management  MRN: [id number]  Setting: Ambulatory outpatient  PCP: McLean-Scocuzza, Nino Glow, MD  Type: Established Patient    Referring Provider: Orland Gibbs *  Location: Office  Delivery: Face-to-face     HPI  [name], a 50 y.o. year old female, is here today because of her Spondylosis without myelopathy or radiculopathy, lumbosacral region [M47.817]. [name] primary complain today is Back Pain (Lumbar bilateral ) Last encounter: [name] was on 07/11/2020. Pertinent problems: [name] has Lumbar facet syndrome (Bilateral) (R>L); Spondylosis without myelopathy or radiculopathy, lumbosacral region; and Morbid obesity with BMI of 45.0-49.9, adult ([place]) on their pertinent problem list. Pain Assessment: Severity of Chronic pain is reported as a 2 /10. Location: Back Lower,Left,Right/was radiating down the right leg but not now. Onset: More than a month ago. Quality: Other (Comment) (no discomfort right now). Timing: Intermittent. Modifying factor(s): PNS has helped approx 85%. Vitals:  height is $RemoveB'5\' 3"'jWUddtUW$  (1.6 m) and weight is 248 lb (112.5 kg). Her temporal temperature is 97.5 F (36.4 C) (abnormal). Her blood pressure is 121/87 and her pulse is 81. Her respiration is 16 and oxygen saturation is 100%.   Reason for encounter: Removal of Sprint lumbar medial branch peripheral nerve  stimulator leads.  She is  Patient presents today for removal of her Sprint lumbar medial branch peripheral nerve stimulator leads.  She endorses approximately 85% pain relief in regards to her low back and bilateral hip pain during her 60-day therapy.  She reports improvement in functional status as well.   ROS  Constitutional: Denies any fever or chills Gastrointestinal: No reported hemesis, hematochezia, vomiting, or acute GI distress Musculoskeletal: Improvement in low back pain. Neurological: No reported episodes of acute onset apraxia, aphasia, dysarthria, agnosia, amnesia, paralysis, loss of coordination, or loss of consciousness  Medication Review  Cetirizine HCl, EPINEPHrine, Olopatadine HCl, Vitamin D-3, albuterol, amLODipine, atorvastatin, diphenhydrAMINE, fluticasone, gabapentin, hydrochlorothiazide, hydrocortisone, levofloxacin, losartan, meloxicam, methocarbamol, montelukast, norethindrone, pantoprazole, and phentermine  History Review  Allergy: [name] is allergic to other, shellfish allergy, strawberry (diagnostic), and tomato. Drug: [name]  reports no history of drug use. Alcohol:  reports current alcohol use. Tobacco:  reports that she has never smoked. She has never used smokeless tobacco. Social: [name]  reports that she has never smoked. She has never used smokeless tobacco. She reports current alcohol use. She reports that she does not use drugs. Medical:  has a past medical history of Arthritis, Carpal boss, right, Carpal tunnel syndrome, bilateral, Headache, Hypertension, and Seasonal asthma (11/29/2018). Surgical: [name]  has a past surgical history that includes Knee arthroscopy (Right) and Anterior cervical decomp/discectomy fusion (N/A, 07/01/2017). Family: family history includes [name] in her mother; Arthritis in her father, mother, and sister; Breast cancer in her maternal aunt and paternal aunt; COPD in her brother and son; Cancer in her  brother; Depression in her son; Diabetes in her mother; Early death in her brother; Hearing loss in her father; Hyperlipidemia in her mother; Hypertension  in her father, mother, and sister; Kidney disease in her father; Lymphoma in her father; Sleep apnea in her daughter.  Laboratory Chemistry Profile   Renal Lab Results  Component Value Date   BUN 15 08/14/2020   CREATININE 0.98 08/14/2020   BCR 13 09/07/2018   GFR 68.93 08/14/2020   GFRAA 76 09/07/2018   GFRNONAA 66 09/07/2018     Hepatic Lab Results  Component Value Date   AST 18 06/11/2020   ALT 29 06/11/2020   ALBUMIN 4.5 06/11/2020   ALKPHOS 45 06/11/2020     Electrolytes Lab Results  Component Value Date   NA 140 08/14/2020   K 3.9 08/14/2020   CL 105 08/14/2020   CALCIUM 9.6 08/14/2020   MG 2.2 09/07/2018     Bone Lab Results  Component Value Date   VD25OH 34.98 02/14/2020   25OHVITD1 8.1 (L) 09/07/2018   25OHVITD2 <1.0 09/07/2018   25OHVITD3 8.1 09/07/2018     Inflammation (CRP: Acute Phase) (ESR: Chronic Phase) Lab Results  Component Value Date   CRP 7 09/07/2018   ESRSEDRATE 67 (H) 09/07/2018       Note: Above Lab results reviewed.   Physical Exam  General appearance: Well nourished, well developed, and well hydrated. In no apparent acute distress Mental status: Alert, oriented x 3 (person, place, & time)       Respiratory: No evidence of acute respiratory distress Eyes: PERLA Vitals: BP 121/87 (BP Location: Left Arm, Patient Position: Sitting, Cuff Size: Large)   Pulse 81   Temp (!) 97.5 F (36.4 C) (Temporal)   Resp 16   Ht $R'5\' 3"'IE$  (1.6 m)   Wt 248 lb (112.5 kg)   SpO2 100%   BMI 43.93 kg/m  BMI: Estimated body mass index is 43.93 kg/m as calculated from the following:   Height as of this encounter: $RemoveBeforeD'5\' 3"'ejyseToULQxiWj$  (1.6 m).   Weight as of this encounter: 248 lb (112.5 kg). Ideal: Ideal body weight: 52.4 kg (115 lb 8.3 oz) Adjusted ideal body weight: 76.4 kg (168 lb 8.2 oz)  Lumbar Spine Area  Exam  Skin & Axial Inspection: No masses, redness, or swelling Alignment: Symmetrical Functional ROM: Improved after treatment       Stability: No instability detected Muscle Tone/Strength: Functionally intact. No obvious neuro-muscular anomalies detected. Sensory (Neurological): Improved Palpation: No palpable anomalies       Provocative Tests: Hyperextension/rotation test: Improved after treatment        Right lumbar medial branch peripheral nerve stimulator removed with tip intact, left lumbar medial branch peripheral nerve stimulator tip fragmented with distal tip and subcu tissue.  Patient informed.  Assessment   Status Diagnosis  Controlled Improving Controlled 1. Spondylosis without myelopathy or radiculopathy, lumbosacral region   2. Lumbar facet syndrome (Bilateral) (R>L)   3. Lumbar facet arthropathy      Updated Problems: Problem  Morbid Obesity With Bmi of 45.0-49.9, Adult ([facility])  Spondylosis Without Myelopathy Or Radiculopathy, Lumbosacral Region  Lumbar facet syndrome (Bilateral) (R>L)    Plan of Care  [name] has a current medication list which includes the following long-term medication(s): albuterol, amlodipine, atorvastatin, cetirizine hcl, diphenhydramine, fluticasone, hydrochlorothiazide, losartan, montelukast, norethindrone, pantoprazole, phentermine, and methocarbamol.  Patient presents today for removal of her Sprint peripheral nerve stimulator and lumbar medial branch leads.  She endorses approximately 85% pain relief in regards to her low back and bilateral hip pain during the course of her treatment.  Her right Sprint peripheral nerve stimulator lead  was removed unremarkably.,  Her left lead did fracture upon removal with small tip fragment retained in subcutaneous tissue.  Patient was informed of this.  Sprint peripheral nerve stimulator representative was in room and provided her with instructions as well as card in the event that she should  need an MRI in the future.  Patient will follow up as needed.  We will continue to monitor how she does.  Follow-up plan:   Return if symptoms worsen or fail to improve.   Recent Visits Date Type Provider Dept  07/11/20 Procedure visit Anita Santa, MD [facility]  06/27/20 Procedure visit Anita Santa, MD [facility]  06/07/20 Office Visit Anita Santa, MD [facility]  Showing recent visits within past 90 days and meeting all other requirements Today's Visits Date Type Provider Dept  09/03/20 Procedure visit Anita Santa, MD [facility]  Showing today's visits and meeting all other requirements Future Appointments No visits were found meeting these conditions. Showing future appointments within next 90 days and meeting all other requirements  I discussed the assessment and treatment plan with the patient. The patient was provided an opportunity to ask questions and all were answered. The patient agreed with the plan and demonstrated an understanding of the instructions.  Patient advised to call back or seek an in-person evaluation if the symptoms or condition worsens.  Duration of encounter: 21mnutes.  Note by: BGillis Santa MD Date: 09/03/2020; Time: 9:59 AM

## 2020-09-03 NOTE — Progress Notes (Signed)
Safety precautions to be maintained throughout the outpatient stay will include: orient to surroundings, keep bed in low position, maintain call bell within reach at all times, provide assistance with transfer out of bed and ambulation.  

## 2020-09-04 ENCOUNTER — Encounter: Payer: Self-pay | Admitting: Internal Medicine

## 2020-09-05 ENCOUNTER — Encounter: Payer: Self-pay | Admitting: Internal Medicine

## 2020-09-05 ENCOUNTER — Telehealth: Payer: BC Managed Care – PPO | Admitting: Internal Medicine

## 2020-09-05 ENCOUNTER — Other Ambulatory Visit: Payer: Self-pay

## 2020-09-05 VITALS — BP 127/73 | Ht 63.0 in | Wt 238.0 lb

## 2020-09-05 DIAGNOSIS — E278 Other specified disorders of adrenal gland: Secondary | ICD-10-CM

## 2020-09-05 DIAGNOSIS — R102 Pelvic and perineal pain: Secondary | ICD-10-CM | POA: Insufficient documentation

## 2020-09-05 DIAGNOSIS — R202 Paresthesia of skin: Secondary | ICD-10-CM

## 2020-09-05 DIAGNOSIS — M79601 Pain in right arm: Secondary | ICD-10-CM

## 2020-09-05 DIAGNOSIS — L509 Urticaria, unspecified: Secondary | ICD-10-CM

## 2020-09-05 DIAGNOSIS — Z1231 Encounter for screening mammogram for malignant neoplasm of breast: Secondary | ICD-10-CM

## 2020-09-05 DIAGNOSIS — R252 Cramp and spasm: Secondary | ICD-10-CM

## 2020-09-05 DIAGNOSIS — D219 Benign neoplasm of connective and other soft tissue, unspecified: Secondary | ICD-10-CM

## 2020-09-05 HISTORY — DX: Urticaria, unspecified: L50.9

## 2020-09-05 HISTORY — DX: Pelvic and perineal pain: R10.2

## 2020-09-05 MED ORDER — CLOBETASOL PROPIONATE 0.05 % EX CREA: 1.0000 "application " | TOPICAL_CREAM | Freq: Two times a day (BID) | CUTANEOUS | 2 refills | Status: DC

## 2020-09-05 NOTE — Patient Instructions (Signed)
Hives Hives (urticaria) are itchy, red, swollen areas on the skin. Hives can appear on any part of the body. Hives often fade within 24 hours (acute hives). Sometimes, new hives appear after old ones fade and the cycle can continue for several days or weeks (chronic hives). Hives do not spread from person to person (are not contagious). Hives come from the body's reaction to something a person is allergic to (allergen), something that causes irritation, or various other triggers. When a person is exposed to a trigger, his or her body releases a chemical (histamine) that causes redness, itching, and swelling. Hives can appear right after exposure to a trigger or hours later. What are the causes? This condition may be caused by:  Allergies to foods or ingredients.  Insect bites or stings.  Exposure to pollen or pets.  Contact with latex or chemicals.  Spending time in sunlight, heat, or cold (exposure).  Exercise.  Stress.  Certain medicines. You can also get hives from other medical conditions and treatments, such as:  Viruses, including the common cold.  Bacterial infections, such as urinary tract infections and strep throat.  Certain medicines.  Allergy shots.  Blood transfusions. Sometimes, the cause of this condition is not known (idiopathic hives). What increases the risk? You are more likely to develop this condition if you:  Are a woman.  Have food allergies, especially to citrus fruits, milk, eggs, peanuts, tree nuts, or shellfish.  Are allergic to: ? Medicines. ? Latex. ? Insects. ? Animals. ? Pollen. What are the signs or symptoms? Common symptoms of this condition include raised, itchy, red or white bumps or patches on your skin. These areas may:  Become large and swollen (welts).  Change in shape and location, quickly and repeatedly.  Be separate hives or connect over a large area of skin.  Sting or become painful.  Turn white when pressed in the  center (blanch). In severe cases, yourhands, feet, and face may also become swollen. This may occur if hives develop deeper in your skin. How is this diagnosed? This condition may be diagnosed by your symptoms, medical history, and physical exam.  Your skin, urine, or blood may be tested to find out what is causing your hives and to rule out other health issues.  Your health care provider may also remove a small sample of skin from the affected area and examine it under a microscope (biopsy). How is this treated? Treatment for this condition depends on the cause and severity of your symptoms. Your health care provider may recommend using cool, wet cloths (cool compresses) or taking cool showers to relieve itching. Treatment may include:  Medicines that help: ? Relieve itching (antihistamines). ? Reduce swelling (corticosteroids). ? Treat infection (antibiotics).  An injectable medicine (omalizumab). Your health care provider may prescribe this if you have chronic idiopathic hives and you continue to have symptoms even after treatment with antihistamines. Severe cases may require an emergency injection of adrenaline (epinephrine) to prevent a life-threatening allergic reaction (anaphylaxis). Follow these instructions at home: Medicines  Take and apply over-the-counter and prescription medicines only as told by your health care provider.  If you were prescribed an antibiotic medicine, take it as told by your health care provider. Do not stop using the antibiotic even if you start to feel better. Skin care  Apply cool compresses to the affected areas.  Do not scratch or rub your skin. General instructions  Do not take hot showers or baths. This can make itching   worse.  Do not wear tight-fitting clothing.  Use sunscreen and wear protective clothing when you are outside.  Avoid any substances that cause your hives. Keep a journal to help track what causes your hives. Write  down: ? What medicines you take. ? What you eat and drink. ? What products you use on your skin.  Keep all follow-up visits as told by your health care provider. This is important. Contact a health care provider if:  Your symptoms are not controlled with medicine.  Your joints are painful or swollen. Get help right away if:  You have a fever.  You have pain in your abdomen.  Your tongue or lips are swollen.  Your eyelids are swollen.  Your chest or throat feels tight.  You have trouble breathing or swallowing. These symptoms may represent a serious problem that is an emergency. Do not wait to see if the symptoms will go away. Get medical help right away. Call your local emergency services (911 in the [place] Do not drive yourself to the hospital. Summary  Hives (urticaria) are itchy, red, swollen areas on your skin. Hives come from the body's reaction to something a person is allergic to (allergen), something that causes irritation, or various other triggers.  Treatment for this condition depends on the cause and severity of your symptoms.  Avoid any substances that cause your hives. Keep a journal to help track what causes your hives.  Take and apply over-the-counter and prescription medicines only as told by your health care provider.  Keep all follow-up visits as told by your health care provider. This is important. This information is not intended to replace advice given to you by your health care provider. Make sure you discuss any questions you have with your health care provider. Document Revised: 03/03/2018 Document Reviewed: 03/03/2018 Elsevier Patient Education  2020 Elsevier Inc.  Pelvic Pain, Female Pelvic pain is pain in your lower abdomen, below your belly button and between your hips. The pain may start suddenly (be acute), keep coming back (be recurring), or last a long time (become chronic). Pelvic pain that lasts longer than 6 months is considered  chronic. Pelvic pain may affect your:  Reproductive organs.  Urinary system.  Digestive tract.  Musculoskeletal system. There are many potential causes of pelvic pain. Sometimes, the pain can be a result of digestive or urinary conditions, strained muscles or ligaments, or reproductive conditions. Sometimes the cause of pelvic pain is not known. Follow these instructions at home:   Take over-the-counter and prescription medicines only as told by your health care provider.  Rest as told by your health care provider.  Do not have sex if it hurts.  Keep a journal of your pelvic pain. Write down: ? When the pain started. ? Where the pain is located. ? What seems to make the pain better or worse, such as food or your period (menstrual cycle). ? Any symptoms you have along with the pain.  Keep all follow-up visits as told by your health care provider. This is important. Contact a health care provider if:  Medicine does not help your pain.  Your pain comes back.  You have new symptoms.  You have abnormal vaginal discharge or bleeding, including bleeding after menopause.  You have a fever or chills.  You are constipated.  You have blood in your urine or stool.  You have foul-smelling urine.  You feel weak or light-headed. Get help right away if:  You have sudden severe pain.  Your pain gets steadily worse.  You have severe pain along with fever, nausea, vomiting, or excessive sweating.  You lose consciousness. Summary  Pelvic pain is pain in your lower abdomen, below your belly button and between your hips.  There are many potential causes of pelvic pain.  Keep a journal of your pelvic pain. This information is not intended to replace advice given to you by your health care provider. Make sure you discuss any questions you have with your health care provider. Document Revised: 02/03/2018 Document Reviewed: 02/03/2018 Elsevier Patient Education  [place].

## 2020-09-05 NOTE — Progress Notes (Addendum)
telephone Note  I connected with Anita Gibbs  on 09/05/20 at  9:50 AM EST by a telephone and verified that I am speaking with the correct person using two identifiers.  Location patient: home, [place] Location provider:work or home office Persons participating in the virtual visit: patient, provider  I discussed the limitations of evaluation and management by telemedicine and the availability of in person appointments. The patient expressed understanding and agreed to proceed.   HPI: 1. RLQ pain since 06/2020 CT and adrenal nodules rec adrenal protocol CT in 6 months fu and f/u endocrine  Will refer back ob/gyn Dr. Megan Salon in the future for f/u she does not have cycles   Reproductive: There is an enlarged fibroid uterus the largest fibroid arises from the left side of uterine fundus extending into the lower abdomen. This measures approximately 5.9 cm. No adnexal mass identified.  Indeterminate left adrenal nodule measures 1.4 cm. In a patient who has no history of malignancy this is probably benign and a 12 month follow-up adrenal protocol CT may be considered. In a patient with a history of known malignancy further evaluation with adrenal protocol CT is recommended at this time. This recommendation follows ACR consensus guidelines: Management of Incidental Adrenal Masses: A White Paper of the ACR Incidental Findings Committee. J Am Coll Radiol 2017;14:1038-1044.  2. Hives taking benadryl and zyrtec and not helping with itching wants to try steroid cream  she thinks related to stress due to taking care of dad with cancer and GM passed away   ROS: See pertinent positives and negatives per HPI.  Past Medical History:  Diagnosis Date  . Arthritis   . Carpal boss, right   . Carpal tunnel syndrome, bilateral   . Headache   . Hives 09/05/2020  . Hypertension   . Seasonal asthma 11/29/2018    Past Surgical History:  Procedure Laterality Date  . ANTERIOR CERVICAL DECOMP/DISCECTOMY  FUSION N/A 07/01/2017   Procedure: ANTERIOR CERVICAL DECOMPRESSION/DISCECTOMY FUSION 3 LEVELS;  Surgeon: Venetia Night, MD;  Location: [facility] ORS;  Service: Neurosurgery;  Laterality: N/A;  . KNEE ARTHROSCOPY Right      Current Outpatient Medications:  .  albuterol (PROVENTIL HFA;VENTOLIN HFA) 108 (90 Base) MCG/ACT inhaler, Inhale 2 puffs into the lungs every 6 (six) hours as needed for wheezing or shortness of breath., Disp: 1 Inhaler, Rfl: 5 .  amLODipine (NORVASC) 5 MG tablet, Take 1 tablet (5 mg total) by mouth daily., Disp: 90 tablet, Rfl: 3 .  atorvastatin (LIPITOR) 10 MG tablet, Take 1 tablet (10 mg total) by mouth daily at 6 PM., Disp: 90 tablet, Rfl: 3 .  Cetirizine HCl (ZYRTEC PO), Take 25 mg by mouth., Disp: , Rfl:  .  Cholecalciferol (VITAMIN D-3) 125 MCG (5000 UT) TABS, Take by mouth daily., Disp: , Rfl:  .  clobetasol cream (TEMOVATE) 0.05 %, Apply 1 application topically 2 (two) times daily. Prn, Disp: 60 g, Rfl: 2 .  diphenhydrAMINE (BENADRYL) 25 mg capsule, Take 25 mg by mouth every 6 (six) hours as needed (for allergies.)., Disp: , Rfl:  .  EPINEPHrine (AUVI-Q) 0.3 mg/0.3 mL IJ SOAJ injection, Use as directed for severe allergic reaction, Disp: 2 Device, Rfl: 2 .  fluticasone (FLONASE) 50 MCG/ACT nasal spray, Place 2 sprays into both nostrils daily., Disp: 16 g, Rfl: 6 .  gabapentin (NEURONTIN) 300 MG capsule, Take 300 mg by mouth in the morning, at noon, and at bedtime., Disp: , Rfl:  .  hydrochlorothiazide (HYDRODIURIL) 25 MG tablet,  Take 0.5 tablets (12.5 mg total) by mouth daily. In am, Disp: 90 tablet, Rfl: 3 .  losartan (COZAAR) 100 MG tablet, Take 1 tablet (100 mg total) by mouth daily., Disp: 90 tablet, Rfl: 3 .  meloxicam (MOBIC) 15 MG tablet, Take 1 tablet (15 mg total) by mouth daily., Disp: 30 tablet, Rfl: 1 .  methocarbamol (ROBAXIN) 750 MG tablet, Take 1 tablet (750 mg total) by mouth every 8 (eight) hours as needed for muscle spasms., Disp: 90 tablet, Rfl: 5 .   montelukast (SINGULAIR) 10 MG tablet, Take 1 tablet (10 mg total) by mouth at bedtime., Disp: 90 tablet, Rfl: 3 .  norethindrone (AYGESTIN) 5 MG tablet, 2 (two) times daily. , Disp: , Rfl:  .  Olopatadine HCl 0.2 % SOLN, Apply 1 drop to eye daily. Both eyes, Disp: 2.5 mL, Rfl: 11 .  pantoprazole (PROTONIX) 40 MG tablet, Take 1 tablet (40 mg total) by mouth daily. 30 min. Before dinner, Disp: 90 tablet, Rfl: 3 .  phentermine (ADIPEX-P) 37.5 MG tablet, Take 1 tablet (37.5 mg total) by mouth daily before breakfast. rx 1/2, Disp: 60 tablet, Rfl: 0 .  hydrocortisone 2.5 % cream, Apply topically 2 (two) times daily. Prn to eyelids (Patient not taking: Reported on 09/05/2020), Disp: 30 g, Rfl: 0  EXAM:  VITALS per patient if applicable:  GENERAL: alert, oriented, appears well and in no acute distress  PSYCH/NEURO: pleasant and cooperative, no obvious depression or anxiety, speech and thought processing grossly intact  ASSESSMENT AND PLAN:  Discussed the following assessment and plan:  Female pelvic pain - Plan: Ambulatory referral to Obstetrics / Gynecology Fibroids - Plan: Ambulatory referral to Obstetrics / Gynecology  Hives - Plan: clobetasol cream (TEMOVATE) 0.05 % Cont benadryl alt zyrtec   Adrenal nodule ([place]) - Plan: CT ABDOMEN W WO CONTRAST do 12/11/20 f/u and f/u endocrine   Leg cramps and numbness/tingling upper arms and legs My chart message 10/09/20  nerves. My arms and hands are locking up more and more these days and my legs are cramping up. They become real numb and I can't move for a minute or two and it hurts. And I'm not certain they can do anything about that at pain management  -referred Dr. Manuella Ghazi pt did not want to wait 2.5 months requested [facility] Dr. Nancy Fetter  HM Flu shot 08/2020  Tdaputd  covid vaccine pfizer 3/3   mammo1/29/21 normal ordered   3/18/2021colonoscopy - GERD hyperplastic polyp f/u in 5 years  02/26/18 neg neg hpv h/o abnl h/o fibroids  Dr.Song right female pelvic pain  Referred today   rec healthy diet and exercise     -we discussed possible serious and likely etiologies, options for evaluation and workup, limitations of telemedicine visit vs in person visit, treatment, treatment risks and precautions.   I discussed the assessment and treatment plan with the patient. The patient was provided an opportunity to ask questions and all were answered. The patient agreed with the plan and demonstrated an understanding of the instructions.    Time spent 30 minutes Delorise Jackson, MD

## 2020-09-26 ENCOUNTER — Encounter: Payer: Self-pay | Admitting: Internal Medicine

## 2020-10-07 ENCOUNTER — Encounter: Payer: Self-pay | Admitting: Internal Medicine

## 2020-10-08 NOTE — Addendum Note (Signed)
Addended by: Orland Mustard on: 10/08/2020 01:55 PM   Modules accepted: Orders

## 2020-10-12 ENCOUNTER — Other Ambulatory Visit: Payer: Self-pay

## 2020-10-12 ENCOUNTER — Ambulatory Visit
Admission: RE | Admit: 2020-10-12 | Discharge: 2020-10-12 | Disposition: A | Discharge status: Home / Self Care | Payer: BC Managed Care – PPO | Source: Ambulatory Visit | Attending: Internal Medicine | Admitting: Internal Medicine

## 2020-10-12 DIAGNOSIS — Z1231 Encounter for screening mammogram for malignant neoplasm of breast: Secondary | ICD-10-CM | POA: Diagnosis not present

## 2020-10-16 ENCOUNTER — Other Ambulatory Visit: Payer: Self-pay | Admitting: Internal Medicine

## 2020-10-16 DIAGNOSIS — R928 Other abnormal and inconclusive findings on diagnostic imaging of breast: Secondary | ICD-10-CM

## 2020-10-16 NOTE — Progress Notes (Signed)
Referred [facility] Dr. Manuella Ghazi they should call patient

## 2020-10-17 HISTORY — PX: ABDOMINAL HYSTERECTOMY: SHX81

## 2020-10-18 ENCOUNTER — Other Ambulatory Visit: Payer: Self-pay | Admitting: Internal Medicine

## 2020-10-18 DIAGNOSIS — R590 Localized enlarged lymph nodes: Secondary | ICD-10-CM

## 2020-10-18 DIAGNOSIS — R928 Other abnormal and inconclusive findings on diagnostic imaging of breast: Secondary | ICD-10-CM

## 2020-10-18 NOTE — Progress Notes (Signed)
Referral letter sent

## 2020-10-22 ENCOUNTER — Other Ambulatory Visit: Payer: Self-pay | Admitting: Internal Medicine

## 2020-10-22 DIAGNOSIS — R590 Localized enlarged lymph nodes: Secondary | ICD-10-CM

## 2020-10-22 DIAGNOSIS — R928 Other abnormal and inconclusive findings on diagnostic imaging of breast: Secondary | ICD-10-CM

## 2020-10-23 ENCOUNTER — Telehealth: Payer: Self-pay

## 2020-10-23 ENCOUNTER — Ambulatory Visit
Admission: RE | Admit: 2020-10-23 | Discharge: 2020-10-23 | Disposition: A | Discharge status: Home / Self Care | Payer: BC Managed Care – PPO | Source: Ambulatory Visit | Attending: Internal Medicine | Admitting: Internal Medicine

## 2020-10-23 ENCOUNTER — Other Ambulatory Visit: Payer: Self-pay

## 2020-10-23 DIAGNOSIS — R928 Other abnormal and inconclusive findings on diagnostic imaging of breast: Secondary | ICD-10-CM

## 2020-10-23 DIAGNOSIS — R590 Localized enlarged lymph nodes: Secondary | ICD-10-CM | POA: Diagnosis present

## 2020-10-23 NOTE — Telephone Encounter (Signed)
Faxed signed order form for right ultrasound breast/axillary to [phone] on 10/23/20.  DX abnormal finding on breast imaging-right axillary adenopathy

## 2020-10-25 ENCOUNTER — Encounter: Payer: Self-pay | Admitting: Internal Medicine

## 2020-10-27 ENCOUNTER — Other Ambulatory Visit: Payer: Self-pay | Admitting: Pain Medicine

## 2020-10-27 ENCOUNTER — Other Ambulatory Visit: Payer: Self-pay | Admitting: Internal Medicine

## 2020-10-27 DIAGNOSIS — I10 Essential (primary) hypertension: Secondary | ICD-10-CM

## 2020-10-27 DIAGNOSIS — M7918 Myalgia, other site: Secondary | ICD-10-CM

## 2020-10-29 ENCOUNTER — Telehealth: Payer: Self-pay | Admitting: Internal Medicine

## 2020-10-29 DIAGNOSIS — I10 Essential (primary) hypertension: Secondary | ICD-10-CM

## 2020-10-29 MED ORDER — LOSARTAN POTASSIUM 100 MG PO TABS: 100.0000 mg | ORAL_TABLET | Freq: Every day | ORAL | 3 refills | Status: DC

## 2020-10-29 NOTE — Telephone Encounter (Signed)
Patient called in for refill for   losartan (COZAAR) 100 MG tablet

## 2020-11-02 NOTE — Addendum Note (Signed)
Addended by: Orland Mustard on: 11/02/2020 07:06 PM   Modules accepted: Orders

## 2020-11-16 ENCOUNTER — Other Ambulatory Visit: Payer: Self-pay | Admitting: Internal Medicine

## 2020-11-16 DIAGNOSIS — E785 Hyperlipidemia, unspecified: Secondary | ICD-10-CM

## 2020-11-19 ENCOUNTER — Telehealth: Payer: Self-pay

## 2020-11-19 DIAGNOSIS — G8929 Other chronic pain: Secondary | ICD-10-CM

## 2020-11-19 DIAGNOSIS — M7918 Myalgia, other site: Secondary | ICD-10-CM

## 2020-11-19 MED ORDER — METHOCARBAMOL 750 MG PO TABS: 750.0000 mg | ORAL_TABLET | Freq: Three times a day (TID) | ORAL | 5 refills | Status: DC | PRN

## 2020-11-19 NOTE — Telephone Encounter (Signed)
LM for patient that Dr Holley Raring sent in scripts.  INformed to keep her scheduled appointment.

## 2020-11-19 NOTE — Telephone Encounter (Signed)
Dr Holley Raring, the patient is out of her Methocarbamol.  She has an appointment to come in 12-04-2020.  Will you send her in a script or would you rather wait until her appointment.

## 2020-11-19 NOTE — Telephone Encounter (Signed)
Pt states the pharmacist is not filling her medication and she is out of medication please follow up with pt.

## 2020-11-26 ENCOUNTER — Telehealth: Payer: Self-pay | Admitting: Internal Medicine

## 2020-11-26 NOTE — Telephone Encounter (Signed)
Have you received any paperwork?  

## 2020-11-26 NOTE — Telephone Encounter (Signed)
Patient called in wanted to know if her insurance company had reach out to [place]  Her insurance co. is [employer]

## 2020-11-27 ENCOUNTER — Other Ambulatory Visit: Payer: Self-pay | Admitting: Internal Medicine

## 2020-11-27 DIAGNOSIS — K219 Gastro-esophageal reflux disease without esophagitis: Secondary | ICD-10-CM

## 2020-11-27 NOTE — Telephone Encounter (Signed)
No they can fax to [phone] what is this regarding and will have fee if outside of appt

## 2020-11-28 NOTE — Telephone Encounter (Signed)
Pt called returning your call 

## 2020-11-28 NOTE — Telephone Encounter (Signed)
Left message to return call 

## 2020-11-29 NOTE — Telephone Encounter (Signed)
Called and spoke with Patient. She states this is disability paperwork that needed to be done in regards to her surgery. Informed the Patient that we have not received this as of yet. Informed her that paperwork done to this extent will be a charge for completion outside of a visit.   Patient verbalized understanding she has our fax number and will have them send this again. Patient would like to be notified when paperwork comes in.

## 2020-11-29 NOTE — Telephone Encounter (Signed)
Sorry Dr. Chana Bode who did her recent surgery will need to fill this out not me she needs to have paperwork routed to her

## 2020-11-29 NOTE — Telephone Encounter (Signed)
Noted, Patient informed she will need to contact HR and OBGYN.

## 2020-11-29 NOTE — Telephone Encounter (Signed)
Spoke with patient, explained to her that she has to go through her employer and HR department in order to initiate disability, explained our office did not do the surgery so we are unable to complete paperwork. Pt. stated she was not going to see HR and disconnected call.

## 2020-12-04 ENCOUNTER — Encounter: Payer: Self-pay | Admitting: Student in an Organized Health Care Education/Training Program

## 2020-12-04 ENCOUNTER — Ambulatory Visit
Payer: BC Managed Care – PPO | Attending: Student in an Organized Health Care Education/Training Program | Admitting: Student in an Organized Health Care Education/Training Program

## 2020-12-04 ENCOUNTER — Other Ambulatory Visit: Payer: Self-pay

## 2020-12-04 VITALS — BP 122/73 | HR 68 | Temp 98.1°F | Resp 16 | Ht 63.0 in | Wt 243.0 lb

## 2020-12-04 DIAGNOSIS — M5416 Radiculopathy, lumbar region: Secondary | ICD-10-CM | POA: Diagnosis not present

## 2020-12-04 DIAGNOSIS — M47816 Spondylosis without myelopathy or radiculopathy, lumbar region: Secondary | ICD-10-CM | POA: Diagnosis not present

## 2020-12-04 DIAGNOSIS — M7918 Myalgia, other site: Secondary | ICD-10-CM | POA: Diagnosis present

## 2020-12-04 DIAGNOSIS — M79604 Pain in right leg: Secondary | ICD-10-CM | POA: Diagnosis present

## 2020-12-04 DIAGNOSIS — G894 Chronic pain syndrome: Secondary | ICD-10-CM | POA: Insufficient documentation

## 2020-12-04 DIAGNOSIS — G8929 Other chronic pain: Secondary | ICD-10-CM | POA: Diagnosis present

## 2020-12-04 DIAGNOSIS — M79605 Pain in left leg: Secondary | ICD-10-CM

## 2020-12-04 DIAGNOSIS — M5442 Lumbago with sciatica, left side: Secondary | ICD-10-CM | POA: Diagnosis not present

## 2020-12-04 DIAGNOSIS — M47817 Spondylosis without myelopathy or radiculopathy, lumbosacral region: Secondary | ICD-10-CM | POA: Diagnosis present

## 2020-12-04 DIAGNOSIS — M5441 Lumbago with sciatica, right side: Secondary | ICD-10-CM | POA: Diagnosis present

## 2020-12-04 MED ORDER — LUMBAR BACK BRACE/SUPPORT PAD MISC: 0 refills | Status: DC

## 2020-12-04 MED ORDER — PREGABALIN 50 MG PO CAPS: 50.0000 mg | ORAL_CAPSULE | Freq: Three times a day (TID) | ORAL | 1 refills | Status: DC

## 2020-12-04 NOTE — Progress Notes (Signed)
Safety precautions to be maintained throughout the outpatient stay will include: orient to surroundings, keep bed in low position, maintain call bell within reach at all times, provide assistance with transfer out of bed and ambulation.  

## 2020-12-04 NOTE — Progress Notes (Signed)
PROVIDER NOTE: Information contained herein reflects review and annotations entered in association with encounter. Interpretation of such information and data should be left to medically-trained personnel. Information provided to patient can be located elsewhere in the medical record under "Patient Instructions". Document created using STT-dictation technology, any transcriptional errors that may result from process are unintentional.    Patient: Anita Gibbs  Service Category: E/M  Provider: Gillis Santa, MD  DOB: 11-21-1970  DOS: 12/04/2020  Specialty: Interventional Pain Management  MRN: [id number]  Setting: Ambulatory outpatient  PCP: McLean-Scocuzza, Nino Glow, MD  Type: Established Patient    Referring Provider: Orland Mustard *  Location: Office  Delivery: Face-to-face     HPI  [name], a 50 y.o. year old female, is here today because of her Lumbar facet arthropathy [M47.816]. Ms. Slaven primary complain today is Hand Pain (bilat), Back Pain, and Knee Pain (right) Last encounter: [name] was on 09/03/2020. Pertinent problems: Ms. Deford has Lumbar facet syndrome (Bilateral) (R>L); Spondylosis without myelopathy or radiculopathy, lumbosacral region; and Morbid obesity with BMI of 45.0-49.9, adult ([place]) on their pertinent problem list. Pain Assessment: Severity of Chronic pain is reported as a 9 /10. Location: Hand Right,Left/fingers are numb. Onset: More than a month ago. Quality: Sore,Tightness. Timing: Constant. Modifying factor(s): nothing. Vitals:  height is '5\' 3"'  (1.6 m) and weight is 243 lb (110.2 kg). Her temporal temperature is 98.1 F (36.7 C). Her blood pressure is 122/73 and her pulse is 68. Her respiration is 16 and oxygen saturation is 100%.   Reason for encounter: worsening of previously known (established) problem    Aleda presents today with worsening bilateral hand pain.  She also describes numbness and tingling of both of her hands.   This radiates up to her wrist.  She denies neck pain that radiates down to her arm.  She has an appointment with Dr. Manuella Ghazi later this month.  I recommend that she discuss nerve conduction velocity/EMG study with Dr. Manuella Ghazi.  Patient is on gabapentin 600 mg which he takes 3 times a day.  She states that the medications do help with out with her numbness and tingling in her hands.  She has not tried Lyrica.  We discussed Lyrica trial.  Risks and benefits reviewed.  I also encouraged a TENS unit for the patient.  She is status post Sprint peripheral nerve stimulation of lumbar medial branch which was helpful for approximately 4 months.  I will also place an order for a lumbar brace that the patient can try.  She states that she has a visit to [place] [place] with her grandkids in August and hopes to be able to go to that and participate.  ROS  Constitutional: Denies any fever or chills Gastrointestinal: No reported hemesis, hematochezia, vomiting, or acute GI distress Musculoskeletal: Bilateral hand pain, low back pain, right knee pain Neurological: No reported episodes of acute onset apraxia, aphasia, dysarthria, agnosia, amnesia, paralysis, loss of coordination, or loss of consciousness  Medication Review  Cetirizine HCl, EPINEPHrine, Lumbar Back Brace/Support Pad, Olopatadine HCl, Vitamin D-3, albuterol, amLODipine, atorvastatin, clobetasol cream, diphenhydrAMINE, fluticasone, hydrochlorothiazide, hydrocortisone, losartan, meloxicam, methocarbamol, montelukast, norethindrone, pantoprazole, phentermine, and pregabalin  History Review  Allergy: Ms. Logie is allergic to other, shellfish allergy, strawberry (diagnostic), and tomato. Drug: Ms. Kaatz  reports no history of drug use. Alcohol:  reports current alcohol use. Tobacco:  reports that she has never smoked. She has never used smokeless tobacco. Social: Ms. Alen  reports  that she has never smoked. She has never used smokeless tobacco. She  reports current alcohol use. She reports that she does not use drugs. Medical:  has a past medical history of Arthritis, Carpal boss, right, Carpal tunnel syndrome, bilateral, Headache, Hives (09/05/2020), Hypertension, and Seasonal asthma (11/29/2018). Surgical: Ms. Zaugg  has a past surgical history that includes Knee arthroscopy (Right); Anterior cervical decomp/discectomy fusion (N/A, 07/01/2017); and Abdominal hysterectomy (10/17/2020). Family: family history includes [name] in her mother; Arthritis in her father, mother, and sister; Breast cancer in her maternal aunt and paternal aunt; COPD in her brother and son; Cancer in her brother; Depression in her son; Diabetes in her mother; Early death in her brother; Hearing loss in her father; Hyperlipidemia in her mother; Hypertension in her father, mother, and sister; Kidney disease in her father; Lymphoma in her father; Sleep apnea in her daughter.  Laboratory Chemistry Profile   Renal Lab Results  Component Value Date   BUN 15 08/14/2020   CREATININE 0.98 08/14/2020   BCR 13 09/07/2018   GFR 68.93 08/14/2020   GFRAA 76 09/07/2018   GFRNONAA 66 09/07/2018     Hepatic Lab Results  Component Value Date   AST 18 06/11/2020   ALT 29 06/11/2020   ALBUMIN 4.5 06/11/2020   ALKPHOS 45 06/11/2020     Electrolytes Lab Results  Component Value Date   NA 140 08/14/2020   K 3.9 08/14/2020   CL 105 08/14/2020   CALCIUM 9.6 08/14/2020   MG 2.2 09/07/2018     Bone Lab Results  Component Value Date   VD25OH 34.98 02/14/2020   25OHVITD1 8.1 (L) 09/07/2018   25OHVITD2 <1.0 09/07/2018   25OHVITD3 8.1 09/07/2018     Inflammation (CRP: Acute Phase) (ESR: Chronic Phase) Lab Results  Component Value Date   CRP 7 09/07/2018   ESRSEDRATE 67 (H) 09/07/2018       Note: Above Lab results reviewed.  Recent Imaging Review  US BREAST LTD UNI RIGHT INC AXILLA CLINICAL DATA:  Screening recall for possibly enlarged right axillary lymph  nodes.  EXAM: ULTRASOUND OF THE RIGHT BREAST  COMPARISON:  Previous exam(s).  FINDINGS: Ultrasound of the right axilla demonstrates multiple lymph nodes with preserved morphology, and cortices measuring up to 3 mm. The contralateral axilla was scanned for comparison purposes demonstrating multiple similar appearing lymph nodes.  IMPRESSION: Prominent right axillary lymph nodes. The cortices measure within normal limits, and their appearance are symmetric with the lymph nodes in the contralateral axilla.  RECOMMENDATION: Screening mammogram in one year.(Code:[id number]-B-01Y)  I have discussed the findings and recommendations with the patient. If applicable, a reminder letter will be sent to the patient regarding the next appointment.  BI-RADS CATEGORY  1: Negative.  Electronically Signed   By: Ammie Ferrier M.D.   On: 10/23/2020 10:46 Note: Reviewed        Physical Exam  General appearance: Well nourished, well developed, and well hydrated. In no apparent acute distress Mental status: Alert, oriented x 3 (person, place, & time)       Respiratory: No evidence of acute respiratory distress Eyes: PERLA Vitals: BP 122/73   Pulse 68   Temp 98.1 F (36.7 C) (Temporal)   Resp 16   Ht '5\' 3"'  (1.6 m)   Wt 243 lb (110.2 kg)   LMP  (LMP Unknown)   SpO2 100%   BMI 43.05 kg/m  BMI: Estimated body mass index is 43.05 kg/m as calculated from the following:   Height as  of this encounter: '5\' 3"'  (1.6 m).   Weight as of this encounter: 243 lb (110.2 kg). Ideal: Ideal body weight: 52.4 kg (115 lb 8.3 oz) Adjusted ideal body weight: 75.5 kg (166 lb 8.2 oz)  Upper Extremity (UE) Exam    Side: Right upper extremity  Side: Left upper extremity  Skin & Extremity Inspection: Skin color, temperature, and hair growth are WNL. No peripheral edema or cyanosis. No masses, redness, swelling, asymmetry, or associated skin lesions. No contractures.  Skin & Extremity Inspection: Skin color,  temperature, and hair growth are WNL. No peripheral edema or cyanosis. No masses, redness, swelling, asymmetry, or associated skin lesions. No contractures.  Functional ROM: Unrestricted ROM          Functional ROM: Unrestricted ROM          Muscle Tone/Strength: Functionally intact. No obvious neuro-muscular anomalies detected.  Muscle Tone/Strength: Functionally intact. No obvious neuro-muscular anomalies detected.  Sensory (Neurological): Arthropathic arthralgia and neurogenic hand          Sensory (Neurological): Arthropathic arthralgia and neurogenic of hand          Palpation: No palpable anomalies              Palpation: No palpable anomalies              Provocative Test(s):  Phalen's test: deferred Tinel's test: deferred Apley's scratch test (touch opposite shoulder):  Action 1 (Across chest): deferred Action 2 (Overhead): deferred Action 3 (LB reach): deferred   Provocative Test(s):  Phalen's test: deferred Tinel's test: deferred Apley's scratch test (touch opposite shoulder):  Action 1 (Across chest): deferred Action 2 (Overhead): deferred Action 3 (LB reach): deferred    Lumbar Spine Area Exam  Skin & Axial Inspection: No masses, redness, or swelling Alignment: Symmetrical Functional ROM: Unrestricted ROM       Stability: No instability detected Muscle Tone/Strength: Functionally intact. No obvious neuro-muscular anomalies detected. Sensory (Neurological): Musculoskeletal pain pattern Lower Extremity Exam    Side: Right lower extremity  Side: Left lower extremity  Stability: No instability observed          Stability: No instability observed          Skin & Extremity Inspection: Skin color, temperature, and hair growth are WNL. No peripheral edema or cyanosis. No masses, redness, swelling, asymmetry, or associated skin lesions. No contractures.  Skin & Extremity Inspection: Skin color, temperature, and hair growth are WNL. No peripheral edema or cyanosis. No masses,  redness, swelling, asymmetry, or associated skin lesions. No contractures.  Functional ROM: Unrestricted ROM                  Functional ROM: Unrestricted ROM                  Muscle Tone/Strength: Functionally intact. No obvious neuro-muscular anomalies detected.  Muscle Tone/Strength: Functionally intact. No obvious neuro-muscular anomalies detected.  Sensory (Neurological): Arthropathic arthralgia        Sensory (Neurological): Arthropathic arthralgia        DTR: Patellar: deferred today Achilles: deferred today Plantar: deferred today  DTR: Patellar: deferred today Achilles: deferred today Plantar: deferred today  Palpation: No palpable anomalies  Palpation: No palpable anomalies    Assessment    Diagnosis   1. Lumbar facet arthropathy   2. Chronic low back pain [facility]) (Bilateral) (R>L) w/ sciatica (Bilateral)   3. Lumbar radiculitis (L5 dermatome) (Right)   4. Chronic lower extremity pain ([facility]  of Pain) (Bilateral) (R>L)   5. Spondylosis without myelopathy or radiculopathy, lumbosacral region   6. Chronic musculoskeletal pain   7. Chronic pain syndrome       Plan of Care   [name] has a current medication list which includes the following long-term medication(s): albuterol, amlodipine, atorvastatin, cetirizine hcl, diphenhydramine, fluticasone, hydrochlorothiazide, losartan, losartan, methocarbamol, montelukast, norethindrone, pantoprazole, phentermine, and pregabalin.  1.  Keep appointment with Dr. Manuella Ghazi neurology.  Recommend nerve conduction velocity/EMG study to evaluate paresthesias of bilateral hands. 2.  Status post branch peripheral nerve stimulation of lumbar medial branch, analgesic benefit for approximately 4 months. 3.  Discontinue gabapentin.  Start Lyrica as below. 4.  Recommend TENS unit and try lumbar brace 5.  Consider lidocaine infusion in future.  Pharmacotherapy (Medications Ordered): Meds ordered this encounter   Medications  . pregabalin (LYRICA) 50 MG capsule    Sig: Take 1 capsule (50 mg total) by mouth 3 (three) times daily.    Dispense:  90 capsule    Refill:  1    Fill one day early if pharmacy is closed on scheduled refill date. May substitute for generic if available.  [employer] Bandages & Supports (LUMBAR BACK BRACE/SUPPORT PAD) MISC    Sig: 1 lumbar back brace for lumbar facet pain    Dispense:  1 each    Refill:  0    Follow-up plan:   Return in about 4 weeks (around 01/01/2021) for Medication Management, in person.      Recent Visits No visits were found meeting these conditions. Showing recent visits within past 90 days and meeting all other requirements Today's Visits Date Type Provider Dept  12/04/20 Office Visit Gillis Santa, MD [facility]  Showing today's visits and meeting all other requirements Future Appointments Date Type Provider Dept  01/01/21 Appointment Gillis Santa, MD [facility]  Showing future appointments within next 90 days and meeting all other requirements  I discussed the assessment and treatment plan with the patient. The patient was provided an opportunity to ask questions and all were answered. The patient agreed with the plan and demonstrated an understanding of the instructions.  Patient advised to call back or seek an in-person evaluation if the symptoms or condition worsens.  Duration of encounter: 30 minutes.  Note by: Gillis Santa, MD Date: 12/04/2020; Time: 10:27 AM

## 2020-12-16 ENCOUNTER — Other Ambulatory Visit: Payer: Self-pay | Admitting: Internal Medicine

## 2020-12-16 DIAGNOSIS — I10 Essential (primary) hypertension: Secondary | ICD-10-CM

## 2020-12-18 ENCOUNTER — Telehealth: Payer: Self-pay | Admitting: Internal Medicine

## 2020-12-18 NOTE — Telephone Encounter (Signed)
I called the patient and LVM informing her that the amlodipine had refills and she needed to call her pharmacy.  Chaddrick Brue,cma

## 2020-12-18 NOTE — Telephone Encounter (Signed)
patient called in for refill amLODipine (NORVASC) 5 MG tablet

## 2020-12-25 ENCOUNTER — Other Ambulatory Visit: Payer: Self-pay | Admitting: Podiatry

## 2020-12-27 ENCOUNTER — Other Ambulatory Visit: Payer: Self-pay | Admitting: Neurology

## 2020-12-27 ENCOUNTER — Other Ambulatory Visit (HOSPITAL_COMMUNITY): Payer: Self-pay | Admitting: Neurology

## 2020-12-27 DIAGNOSIS — R292 Abnormal reflex: Secondary | ICD-10-CM

## 2020-12-27 DIAGNOSIS — M4802 Spinal stenosis, cervical region: Secondary | ICD-10-CM

## 2020-12-28 ENCOUNTER — Other Ambulatory Visit: Payer: Self-pay

## 2020-12-28 DIAGNOSIS — I10 Essential (primary) hypertension: Secondary | ICD-10-CM

## 2020-12-28 MED ORDER — AMLODIPINE BESYLATE 5 MG PO TABS: 5.0000 mg | ORAL_TABLET | Freq: Every day | ORAL | 1 refills | Status: DC

## 2021-01-01 ENCOUNTER — Ambulatory Visit
Payer: BC Managed Care – PPO | Attending: Student in an Organized Health Care Education/Training Program | Admitting: Student in an Organized Health Care Education/Training Program

## 2021-01-01 ENCOUNTER — Other Ambulatory Visit: Payer: Self-pay

## 2021-01-01 ENCOUNTER — Encounter: Payer: Self-pay | Admitting: Student in an Organized Health Care Education/Training Program

## 2021-01-01 VITALS — BP 136/58 | HR 76 | Temp 97.0°F | Resp 16 | Ht 63.0 in | Wt 243.0 lb

## 2021-01-01 DIAGNOSIS — M5416 Radiculopathy, lumbar region: Secondary | ICD-10-CM | POA: Diagnosis present

## 2021-01-01 DIAGNOSIS — M5441 Lumbago with sciatica, right side: Secondary | ICD-10-CM | POA: Diagnosis present

## 2021-01-01 DIAGNOSIS — M542 Cervicalgia: Secondary | ICD-10-CM | POA: Diagnosis not present

## 2021-01-01 DIAGNOSIS — M47816 Spondylosis without myelopathy or radiculopathy, lumbar region: Secondary | ICD-10-CM

## 2021-01-01 DIAGNOSIS — G8929 Other chronic pain: Secondary | ICD-10-CM | POA: Diagnosis present

## 2021-01-01 DIAGNOSIS — M5442 Lumbago with sciatica, left side: Secondary | ICD-10-CM

## 2021-01-01 DIAGNOSIS — M7918 Myalgia, other site: Secondary | ICD-10-CM

## 2021-01-01 DIAGNOSIS — M79605 Pain in left leg: Secondary | ICD-10-CM | POA: Insufficient documentation

## 2021-01-01 DIAGNOSIS — M792 Neuralgia and neuritis, unspecified: Secondary | ICD-10-CM | POA: Diagnosis present

## 2021-01-01 DIAGNOSIS — M5412 Radiculopathy, cervical region: Secondary | ICD-10-CM | POA: Insufficient documentation

## 2021-01-01 DIAGNOSIS — M47812 Spondylosis without myelopathy or radiculopathy, cervical region: Secondary | ICD-10-CM

## 2021-01-01 DIAGNOSIS — M79604 Pain in right leg: Secondary | ICD-10-CM

## 2021-01-01 MED ORDER — PREGABALIN 75 MG PO CAPS: 75.0000 mg | ORAL_CAPSULE | Freq: Three times a day (TID) | ORAL | 2 refills | Status: DC

## 2021-01-01 MED ORDER — METHOCARBAMOL 750 MG PO TABS: 750.0000 mg | ORAL_TABLET | Freq: Three times a day (TID) | ORAL | 5 refills | Status: DC | PRN

## 2021-01-01 NOTE — Progress Notes (Signed)
PROVIDER NOTE: Information contained herein reflects review and annotations entered in association with encounter. Interpretation of such information and data should be left to medically-trained personnel. Information provided to patient can be located elsewhere in the medical record under "Patient Instructions". Document created using STT-dictation technology, any transcriptional errors that may result from process are unintentional.    Patient: Anita Gibbs  Service Category: E/M  Provider: Gillis Santa, MD  DOB: 08/04/1971  DOS: 01/01/2021  Specialty: Interventional Pain Management  MRN: [id number]  Setting: Ambulatory outpatient  PCP: McLean-Scocuzza, Nino Glow, MD  Type: Established Patient    Referring Provider: Orland Mustard *  Location: Office  Delivery: Face-to-face     HPI  [name], a 50 y.o. year old female, is here today because of her Cervical radicular pain [M54.12]. [name] primary complain today is Back Pain (lower) and Neck Pain Last encounter: [name] was on 12/04/2020. Pertinent problems: [name] has Lumbar facet syndrome (Bilateral) (R>L); Spondylosis without myelopathy or radiculopathy, lumbosacral region; and Morbid obesity with BMI of 45.0-49.9, adult ([place]) on their pertinent problem list. Pain Assessment: Severity of Chronic pain is reported as a 7 /10. Location: Back Lower/Denies, HOWEVER fingers are numb all the time. Onset: More than a month ago. Quality: Tightness. Timing: Constant. Modifying factor(s): Lyrica helps some in that pt states now she can sleep through the night. Vitals:  height is '5\' 3"'  (1.6 m) and weight is 243 lb (110.2 kg). Her temporal temperature is 97 F (36.1 C) (abnormal). Her blood pressure is 136/58 (abnormal) and her pulse is 76. Her respiration is 16 and oxygen saturation is 100%.   Reason for encounter: medication management.    Since the patient's last visit with me, she saw neurology, Dr. 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  She has also noticed weakness in her arms has noticed difficulty with fine motor skills such as utilizing utensils or holding anything.  Dr. Manuella Ghazi has ordered a repeat MRI of cervical spine as well as nerve conduction velocity study of upper extremity.  She continues on Lyrica and is finding benefit from that.  She states that she can sleep at night.  She also utilizes Robaxin as needed for muscle spasms.  She has completed Sprint peripheral nerve stimulation of lumbar medial branch which she found helpful.   ROS  Constitutional: Denies any fever or chills Gastrointestinal: No reported hemesis, hematochezia, vomiting, or acute GI distress Musculoskeletal: Denies any acute onset joint swelling, redness, loss of ROM, or weakness Neurological: No reported episodes of acute onset apraxia, aphasia, dysarthria, agnosia, amnesia, paralysis, loss of coordination, or loss of consciousness  Medication Review  Cetirizine HCl, EPINEPHrine, Lumbar Back Brace/Support Pad, Olopatadine HCl, Vitamin D-3, albuterol, amLODipine, atorvastatin, clobetasol cream, diphenhydrAMINE, fluticasone, hydrochlorothiazide, hydrocortisone, losartan, meloxicam, methocarbamol, montelukast, norethindrone, pantoprazole, phentermine, and pregabalin  History Review  Allergy: [name] is allergic to other, shellfish allergy, strawberry (diagnostic), and tomato. Drug: [name]  reports no history of drug use. Alcohol:  reports current alcohol use. Tobacco:  reports that she has never smoked. She has never used smokeless tobacco. Social: [name]  reports that she has never smoked. She has never used smokeless tobacco. She reports current alcohol use. She reports that she does not use drugs. Medical:  has a past medical history of Arthritis, Carpal boss, right, Carpal tunnel syndrome, bilateral, Headache, Hives (09/05/2020), Hypertension, and Seasonal asthma  (11/29/2018). Surgical: [name]  has a past surgical history that includes Knee arthroscopy (Right); Anterior cervical decomp/discectomy fusion (N/A, 07/01/2017); and Abdominal hysterectomy (10/17/2020). Family: family history includes [name] in her mother; Arthritis in her father, mother, and sister; Breast cancer in her maternal aunt and paternal aunt; COPD in her brother and son; Cancer in her brother; Depression in her son; Diabetes in her mother; Early death in her brother; Hearing loss in her father; Hyperlipidemia in her mother; Hypertension in her father, mother, and sister; Kidney disease in her father; Lymphoma in her father; Sleep apnea in her daughter.  Laboratory Chemistry Profile   Renal Lab Results  Component Value Date   BUN 15 08/14/2020   CREATININE 0.98 08/14/2020   BCR 13 09/07/2018   GFR 68.93 08/14/2020   GFRAA 76 09/07/2018   GFRNONAA 66 09/07/2018     Hepatic Lab Results  Component Value Date   AST 18 06/11/2020   ALT 29 06/11/2020   ALBUMIN 4.5 06/11/2020   ALKPHOS 45 06/11/2020     Electrolytes Lab Results  Component Value Date   NA 140 08/14/2020   K 3.9 08/14/2020   CL 105 08/14/2020   CALCIUM 9.6 08/14/2020   MG 2.2 09/07/2018     Bone Lab Results  Component Value Date   VD25OH 34.98 02/14/2020   25OHVITD1 8.1 (L) 09/07/2018   25OHVITD2 <1.0 09/07/2018   25OHVITD3 8.1 09/07/2018     Inflammation (CRP: Acute Phase) (ESR: Chronic Phase) Lab Results  Component Value Date   CRP 7 09/07/2018   ESRSEDRATE 67 (H) 09/07/2018       Note: Above Lab results reviewed.  Recent Imaging Review  US BREAST LTD UNI RIGHT INC AXILLA CLINICAL DATA:  Screening recall for possibly enlarged right axillary lymph nodes.  EXAM: ULTRASOUND OF THE RIGHT BREAST  COMPARISON:  Previous exam(s).  FINDINGS: Ultrasound of the right axilla demonstrates multiple lymph nodes with preserved morphology, and cortices measuring up to 3 mm. The contralateral  axilla was scanned for comparison purposes demonstrating multiple similar appearing lymph nodes.  IMPRESSION: Prominent right axillary lymph nodes. The cortices measure within normal limits, and their appearance are symmetric with the lymph nodes in the contralateral axilla.  RECOMMENDATION: Screening mammogram in one year.(Code:[id number]-01Y)  I have discussed the findings and recommendations with the patient. If applicable, a reminder letter will be sent to the patient regarding the next appointment.  BI-RADS CATEGORY  1: Negative.  Electronically Signed   By: Ammie Ferrier M.D.   On: 10/23/2020 10:46 Note: Reviewed        Physical Exam  General appearance: Well nourished, well developed, and well hydrated. In no apparent acute distress Mental status: Alert, oriented x 3 (person, place, & time)       Respiratory: No evidence of acute respiratory distress Eyes: PERLA Vitals: BP (!) 136/58   Pulse 76   Temp (!) 97 F (36.1 C) (Temporal)   Resp 16   Ht '5\' 3"'  (1.6 m)   Wt 243 lb (110.2 kg)   LMP  (LMP Unknown)   SpO2 100%   BMI 43.05 kg/m  BMI: Estimated body mass index is 43.05 kg/m as calculated from the following:   Height as of this encounter: '5\' 3"'  (1.6 m).   Weight as of this encounter: 243 lb (110.2 kg). Ideal: Ideal body weight: 52.4 kg (115 lb 8.3 oz) Adjusted ideal body weight: 75.5 kg (166 lb 8.2 oz)  Cervical Spine Area Exam  Skin & Axial Inspection: Well healed scar  from previous spine surgery detected Alignment: Symmetrical Functional ROM: Unrestricted ROM      Stability: No instability detected Muscle Tone/Strength: Functionally intact. No obvious neuro-muscular anomalies detected. Sensory (Neurological): Neurogenic pain pattern Palpation: No palpable anomalies             Upper Extremity (UE) Exam    Side: Right upper extremity  Side: Left upper extremity  Skin & Extremity Inspection: Skin color, temperature, and hair growth are WNL. No peripheral  edema or cyanosis. No masses, redness, swelling, asymmetry, or associated skin lesions. No contractures.  Skin & Extremity Inspection: Skin color, temperature, and hair growth are WNL. No peripheral edema or cyanosis. No masses, redness, swelling, asymmetry, or associated skin lesions. No contractures.  Functional ROM: Pain restricted ROM for all joints of upper extremity  Functional ROM: Pain restricted ROM for all joints of upper extremity  Muscle Tone/Strength: Functionally intact. No obvious neuro-muscular anomalies detected.  Muscle Tone/Strength: Functionally intact. No obvious neuro-muscular anomalies detected.  Sensory (Neurological): Neurogenic pain pattern          Sensory (Neurological): Neurogenic pain pattern          Palpation: No palpable anomalies              Palpation: No palpable anomalies              Provocative Test(s):  Phalen's test: deferred Tinel's test: deferred Apley's scratch test (touch opposite shoulder):  Action 1 (Across chest): deferred Action 2 (Overhead): deferred Action 3 (LB reach): deferred   Provocative Test(s):  Phalen's test: deferred Tinel's test: deferred Apley's scratch test (touch opposite shoulder):  Action 1 (Across chest): deferred Action 2 (Overhead): deferred Action 3 (LB reach): deferred    Lumbar Spine Area Exam  Skin & Axial Inspection: No masses, redness, or swelling Alignment: Symmetrical Functional ROM: Unrestricted ROM       Stability: No instability detected Muscle Tone/Strength: Functionally intact. No obvious neuro-muscular anomalies detected. Sensory (Neurological): Musculoskeletal pain pattern  Lower Extremity Exam    Side: Right lower extremity  Side: Left lower extremity  Stability: No instability observed          Stability: No instability observed          Skin & Extremity Inspection: Skin color, temperature, and hair growth are WNL. No peripheral edema or cyanosis. No masses, redness, swelling, asymmetry, or  associated skin lesions. No contractures.  Skin & Extremity Inspection: Skin color, temperature, and hair growth are WNL. No peripheral edema or cyanosis. No masses, redness, swelling, asymmetry, or associated skin lesions. No contractures.  Functional ROM: Unrestricted ROM                  Functional ROM: Unrestricted ROM                  Muscle Tone/Strength: Functionally intact. No obvious neuro-muscular anomalies detected.  Muscle Tone/Strength: Functionally intact. No obvious neuro-muscular anomalies detected.  Sensory (Neurological): Unimpaired        Sensory (Neurological): Unimpaired        DTR: Patellar: deferred today Achilles: deferred today Plantar: deferred today  DTR: Patellar: deferred today Achilles: deferred today Plantar: deferred today  Palpation: No palpable anomalies  Palpation: No palpable anomalies    Assessment   Diagnosis  1. Cervical radicular pain   2. Lumbar facet arthropathy   3. Chronic low back pain [facility] Area of Pain) (Bilateral) (R>L) w/ sciatica (Bilateral)   4. Cervicalgia (Primary Area of Pain) (Bilateral) (L>R)  5. Neurogenic pain   6. Spondylosis without myelopathy or radiculopathy, cervical region   7. Chronic lower extremity pain (Fourth Area of Pain) (Bilateral) (R>L)   8. Lumbar radiculitis (L5 dermatome) (Right)   9. Lumbar facet syndrome (Bilateral) (R>L)   10. Chronic musculoskeletal pain      Plan of Care   [name] has a current medication list which includes the following long-term medication(s): albuterol, amlodipine, atorvastatin, cetirizine hcl, diphenhydramine, fluticasone, hydrochlorothiazide, losartan, montelukast, norethindrone, pantoprazole, phentermine, pregabalin, losartan, and methocarbamol.  1.  Increase Lyrica to 75 mg 3 times daily 2.  Refill Robaxin as below 3.  Follow-up with neurology regarding cervical MRI and nerve conduction velocity study.  I informed patient that based on her cervical MRI, if we  need to do a cervical epidural steroid injection, to call the clinic and schedule.  As needed order placed as below.  Pharmacotherapy (Medications Ordered): Meds ordered this encounter  Medications  . pregabalin (LYRICA) 75 MG capsule    Sig: Take 1 capsule (75 mg total) by mouth 3 (three) times daily.    Dispense:  90 capsule    Refill:  2    Fill one day early if pharmacy is closed on scheduled refill date. May substitute for generic if available.  . methocarbamol (ROBAXIN) 750 MG tablet    Sig: Take 1 tablet (750 mg total) by mouth every 8 (eight) hours as needed for muscle spasms.    Dispense:  90 tablet    Refill:  5    Fill one day early if pharmacy is closed on scheduled refill date. May substitute for generic if available.   Orders Placed This Encounter  Procedures  . Cervical Epidural Injection    Procedure: Cervical Epidural Steroid Injection/Block Purpose: Diagnostic Indication(s): Radiculitis and/or cervicalgia associater with cervical degenerative disc disease.    Standing Status:   Standing    Number of Occurrences:   6    Standing Expiration Date:   01/01/2022    Scheduling Instructions:     Level(s): C7-T1     Laterality: TBD     Sedation: Patient's choice.     Timeframe: PRN    Order Specific Question:   Where will this procedure be performed?    Answer:   [facility]    Comments:   [facility]     Follow-up plan:   Return in about 3 months (around 04/03/2021) for Medication Management, in person.   Recent Visits Date Type Provider Dept  12/04/20 Office Visit Gillis Santa, MD [facility]  Showing recent visits within past 90 days and meeting all other requirements Today's Visits Date Type Provider Dept  01/01/21 Office Visit Gillis Santa, MD [facility]  Showing today's visits and meeting all other requirements Future Appointments No visits were found meeting these conditions. Showing future appointments within next 90 days and  meeting all other requirements  I discussed the assessment and treatment plan with the patient. The patient was provided an opportunity to ask questions and all were answered. The patient agreed with the plan and demonstrated an understanding of the instructions.  Patient advised to call back or seek an in-person evaluation if the symptoms or condition worsens.  Duration of encounter:30 minutes.  Note by: Gillis Santa, MD Date: 01/01/2021; Time: 1:32 PM

## 2021-01-01 NOTE — Progress Notes (Signed)
Safety precautions to be maintained throughout the outpatient stay will include: orient to surroundings, keep bed in low position, maintain call bell within reach at all times, provide assistance with transfer out of bed and ambulation.  

## 2021-01-02 ENCOUNTER — Ambulatory Visit: Payer: BC Managed Care – PPO

## 2021-01-02 ENCOUNTER — Ambulatory Visit
Admission: RE | Admit: 2021-01-02 | Discharge: 2021-01-02 | Disposition: A | Discharge status: Home / Self Care | Payer: BC Managed Care – PPO | Source: Ambulatory Visit | Attending: Neurology | Admitting: Neurology

## 2021-01-02 ENCOUNTER — Other Ambulatory Visit: Payer: Self-pay

## 2021-01-02 ENCOUNTER — Other Ambulatory Visit: Payer: Self-pay | Admitting: Neurology

## 2021-01-02 DIAGNOSIS — Z5309 Procedure and treatment not carried out because of other contraindication: Secondary | ICD-10-CM

## 2021-01-02 DIAGNOSIS — R292 Abnormal reflex: Secondary | ICD-10-CM | POA: Diagnosis present

## 2021-01-02 DIAGNOSIS — M4802 Spinal stenosis, cervical region: Secondary | ICD-10-CM | POA: Diagnosis not present

## 2021-01-03 ENCOUNTER — Encounter: Payer: Self-pay | Admitting: Internal Medicine

## 2021-01-03 ENCOUNTER — Telehealth: Payer: Self-pay | Admitting: Internal Medicine

## 2021-01-03 ENCOUNTER — Other Ambulatory Visit: Payer: Self-pay

## 2021-01-03 ENCOUNTER — Ambulatory Visit: Payer: BC Managed Care – PPO | Admitting: Internal Medicine

## 2021-01-03 DIAGNOSIS — Z6841 Body Mass Index (BMI) 40.0 and over, adult: Secondary | ICD-10-CM

## 2021-01-03 NOTE — Progress Notes (Signed)
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 Olivia Mackie McLean-Scocuzza

## 2021-01-03 NOTE — Telephone Encounter (Signed)
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

## 2021-01-08 NOTE — Telephone Encounter (Signed)
LVM for pt to reschedule appt.

## 2021-01-17 ENCOUNTER — Telehealth: Payer: Self-pay

## 2021-01-17 NOTE — Telephone Encounter (Signed)
error 

## 2021-01-24 ENCOUNTER — Other Ambulatory Visit: Payer: Self-pay

## 2021-01-24 ENCOUNTER — Encounter: Payer: Self-pay | Admitting: Internal Medicine

## 2021-01-24 ENCOUNTER — Ambulatory Visit (INDEPENDENT_AMBULATORY_CARE_PROVIDER_SITE_OTHER): Payer: BC Managed Care – PPO | Admitting: Internal Medicine

## 2021-01-24 ENCOUNTER — Ambulatory Visit: Payer: BC Managed Care – PPO | Admitting: Internal Medicine

## 2021-01-24 ENCOUNTER — Ambulatory Visit
Admission: RE | Admit: 2021-01-24 | Discharge: 2021-01-24 | Disposition: A | Discharge status: Home / Self Care | Payer: BC Managed Care – PPO | Source: Ambulatory Visit | Attending: Internal Medicine | Admitting: Internal Medicine

## 2021-01-24 VITALS — BP 110/74 | HR 69 | Temp 98.0°F | Ht 63.0 in | Wt 252.6 lb

## 2021-01-24 DIAGNOSIS — E669 Obesity, unspecified: Secondary | ICD-10-CM

## 2021-01-24 DIAGNOSIS — Z13220 Encounter for screening for lipoid disorders: Secondary | ICD-10-CM | POA: Diagnosis not present

## 2021-01-24 DIAGNOSIS — M5417 Radiculopathy, lumbosacral region: Secondary | ICD-10-CM

## 2021-01-24 DIAGNOSIS — M898X9 Other specified disorders of bone, unspecified site: Secondary | ICD-10-CM

## 2021-01-24 DIAGNOSIS — R0602 Shortness of breath: Secondary | ICD-10-CM

## 2021-01-24 DIAGNOSIS — R7303 Prediabetes: Secondary | ICD-10-CM

## 2021-01-24 DIAGNOSIS — R29898 Other symptoms and signs involving the musculoskeletal system: Secondary | ICD-10-CM

## 2021-01-24 DIAGNOSIS — Z9889 Other specified postprocedural states: Secondary | ICD-10-CM

## 2021-01-24 DIAGNOSIS — F419 Anxiety disorder, unspecified: Secondary | ICD-10-CM

## 2021-01-24 DIAGNOSIS — G629 Polyneuropathy, unspecified: Secondary | ICD-10-CM

## 2021-01-24 DIAGNOSIS — M2550 Pain in unspecified joint: Secondary | ICD-10-CM

## 2021-01-24 DIAGNOSIS — F32A Depression, unspecified: Secondary | ICD-10-CM

## 2021-01-24 DIAGNOSIS — I10 Essential (primary) hypertension: Secondary | ICD-10-CM

## 2021-01-24 DIAGNOSIS — G5603 Carpal tunnel syndrome, bilateral upper limbs: Secondary | ICD-10-CM

## 2021-01-24 DIAGNOSIS — E538 Deficiency of other specified B group vitamins: Secondary | ICD-10-CM

## 2021-01-24 DIAGNOSIS — E559 Vitamin D deficiency, unspecified: Secondary | ICD-10-CM

## 2021-01-24 DIAGNOSIS — R600 Localized edema: Secondary | ICD-10-CM | POA: Diagnosis not present

## 2021-01-24 DIAGNOSIS — R748 Abnormal levels of other serum enzymes: Secondary | ICD-10-CM

## 2021-01-24 DIAGNOSIS — R768 Other specified abnormal immunological findings in serum: Secondary | ICD-10-CM

## 2021-01-24 DIAGNOSIS — M549 Dorsalgia, unspecified: Secondary | ICD-10-CM

## 2021-01-24 LAB — LIPID PANEL
Cholesterol: 171 mg/dL (ref 0–200)
HDL: 57 mg/dL (ref >39.00)
LDL Cholesterol: 100 mg/dL — ABNORMAL HIGH (ref 0–99)
NonHDL: 113.86
Total CHOL/HDL Ratio: 3
Triglycerides: 68 mg/dL (ref 0.0–149.0)
VLDL: 13.6 mg/dL (ref 0.0–40.0)

## 2021-01-24 LAB — CBC WITH DIFFERENTIAL/PLATELET
Basophils Absolute: 0 10*3/uL (ref 0.0–0.1)
Basophils Relative: 0.5 % (ref 0.0–3.0)
Eosinophils Absolute: 0.1 10*3/uL (ref 0.0–0.7)
Eosinophils Relative: 1.5 % (ref 0.0–5.0)
HCT: 38 % (ref 36.0–46.0)
Hemoglobin: 12.9 g/dL (ref 12.0–15.0)
Lymphocytes Relative: 38 % (ref 12.0–46.0)
Lymphs Abs: 2.1 10*3/uL (ref 0.7–4.0)
MCHC: 34 g/dL (ref 30.0–36.0)
MCV: 95.6 fl (ref 78.0–100.0)
Monocytes Absolute: 0.4 10*3/uL (ref 0.1–1.0)
Monocytes Relative: 7.2 % (ref 3.0–12.0)
Neutro Abs: 2.9 10*3/uL (ref 1.4–7.7)
Neutrophils Relative %: 52.8 % (ref 43.0–77.0)
Platelets: 254 10*3/uL (ref 150.0–400.0)
RBC: 3.97 Mil/uL (ref 3.87–5.11)
RDW: 14.2 % (ref 11.5–15.5)
WBC: 5.4 10*3/uL (ref 4.0–10.5)

## 2021-01-24 LAB — SEDIMENTATION RATE: Sed Rate: 37 mm/hr — ABNORMAL HIGH (ref 0–20)

## 2021-01-24 LAB — BRAIN NATRIURETIC PEPTIDE: Pro B Natriuretic peptide (BNP): 7 pg/mL (ref 0.0–100.0)

## 2021-01-24 LAB — COMPREHENSIVE METABOLIC PANEL
ALT: 123 U/L — ABNORMAL HIGH (ref 0–35)
AST: 58 U/L — ABNORMAL HIGH (ref 0–37)
Albumin: 4.2 g/dL (ref 3.5–5.2)
Alkaline Phosphatase: 61 U/L (ref 39–117)
BUN: 16 mg/dL (ref 6–23)
CO2: 29 mEq/L (ref 19–32)
Calcium: 9.8 mg/dL (ref 8.4–10.5)
Chloride: 101 mEq/L (ref 96–112)
Creatinine, Ser: 0.9 mg/dL (ref 0.40–1.20)
GFR: 76.11 mL/min (ref >60.00)
Glucose, Bld: 95 mg/dL (ref 70–99)
Potassium: 4.1 mEq/L (ref 3.5–5.1)
Sodium: 138 mEq/L (ref 135–145)
Total Bilirubin: 0.8 mg/dL (ref 0.2–1.2)
Total Protein: 7.5 g/dL (ref 6.0–8.3)

## 2021-01-24 LAB — TSH: TSH: 2.77 u[IU]/mL (ref 0.35–4.50)

## 2021-01-24 LAB — C-REACTIVE PROTEIN: CRP: <1 mg/dL (ref 0.5–20.0)

## 2021-01-24 LAB — VITAMIN D 25 HYDROXY (VIT D DEFICIENCY, FRACTURES): VITD: 54.97 ng/mL (ref 30.00–100.00)

## 2021-01-24 LAB — FOLATE: Folate: 7.9 ng/mL (ref >5.9)

## 2021-01-24 LAB — HEMOGLOBIN A1C: Hgb A1c MFr Bld: 6.3 % (ref 4.6–6.5)

## 2021-01-24 MED ORDER — POTASSIUM CHLORIDE CRYS ER 20 MEQ PO TBCR
20.0000 meq | EXTENDED_RELEASE_TABLET | Freq: Every day | ORAL | 0 refills | Status: DC
Start: 2021-01-24 — End: 2021-07-31

## 2021-01-24 MED ORDER — FUROSEMIDE 20 MG PO TABS: 20.0000 mg | ORAL_TABLET | Freq: Every day | ORAL | 0 refills | Status: DC

## 2021-01-24 MED ORDER — DULOXETINE HCL 20 MG PO CPEP: 20.0000 mg | ORAL_CAPSULE | Freq: Every day | ORAL | 3 refills | Status: DC

## 2021-01-24 NOTE — Progress Notes (Addendum)
Chief Complaint  Patient presents with  . Referral   F/u  1. 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 S/p low back surgery with h/o leg weakness R>L leg 3. C/o b/l leg edema x 2 weeks [place] legs negative   Review of Systems  Constitutional: Negative for weight loss.  HENT: Negative for hearing loss.   Eyes: Negative for blurred vision.  Respiratory: Negative for shortness of breath.   Cardiovascular: Positive for leg swelling.  Gastrointestinal: Negative for abdominal pain.  Musculoskeletal: Negative for falls and joint pain.  Skin: Negative for rash.  Neurological: Positive for sensory change and weakness.  Psychiatric/Behavioral: Positive for depression.   Past Medical History:  Diagnosis Date  . Arthritis   . Carpal boss, right   . Carpal tunnel syndrome, bilateral   . Headache   . Hives 09/05/2020  . Hypertension   . Seasonal asthma 11/29/2018   Past Surgical History:  Procedure Laterality Date  . ABDOMINAL HYSTERECTOMY  10/17/2020   [facility]  . ANTERIOR CERVICAL DECOMP/DISCECTOMY FUSION N/A 07/01/2017   Procedure: ANTERIOR CERVICAL DECOMPRESSION/DISCECTOMY FUSION 3 LEVELS;  Surgeon: Meade Maw, MD;  Location: [facility] ORS;  Service: Neurosurgery;  Laterality: N/A;  . KNEE ARTHROSCOPY Right    Family History  Problem Relation Age of Onset  . [name] Mother   . Diabetes Mother   . Arthritis Mother   . Hypertension Mother   . Hyperlipidemia Mother   . Hypertension Father   . Arthritis Father   . Hearing loss Father   . Kidney disease Father   . Lymphoma  Father        large b cell   . Arthritis Sister   . Hypertension Sister   . Cancer Brother        ? lung due to exposure   . COPD Brother   . Early death Brother   . COPD Son   . Depression Son   . Breast cancer Maternal Aunt   . Breast cancer Paternal Aunt   . Sleep apnea Daughter   . Allergic rhinitis Neg Hx   . Angioedema Neg Hx   . Eczema Neg Hx   . Urticaria Neg Hx   . Asthma Neg Hx    Social History   Socioeconomic History  . Marital status: Widowed    Spouse name: Not on file  . Number of children: Not on file  . Years of education: Not on file  . Highest education level: Not on file  Occupational History  . Not on file  Tobacco Use  . Smoking status: Never Smoker  . Smokeless tobacco: Never Used  Vaping Use  . Vaping Use: Never used  Substance and Sexual Activity  . Alcohol use: Yes    Comment: occ  . Drug use: No  . Sexual activity: Yes    Birth control/protection: None  Other Topics Concern  . Not on file  Social History Narrative   Bus driver    And school custodian    1 daughter lives in [place]  35 y.o as of 01/17/20   1 son 43 almost 61 in [place] Strain: Not on file  Food Insecurity: Not on file  Transportation Needs: Not on file  Physical Activity: Not on file  Stress: Not on file  Social Connections: Not on file  Intimate Partner Violence: Not on file   Current Meds  Medication Sig  . albuterol (PROVENTIL HFA;VENTOLIN HFA) 108 (90 Base) MCG/ACT inhaler Inhale 2 puffs into the lungs every 6 (six) hours as needed for wheezing or shortness of breath.  Marland Kitchen amLODipine (NORVASC) 5 MG tablet Take 1 tablet (5 mg total) by mouth daily.  Marland Kitchen atorvastatin (LIPITOR) 10 MG tablet TAKE 1 TABLET BY MOUTH DAILY AT 6PM  . Cetirizine HCl (ZYRTEC PO) Take 25 mg by mouth.  . Cholecalciferol (VITAMIN D-3) 125 MCG (5000 UT) TABS Take by mouth daily.  . clobetasol cream (TEMOVATE) 2.29 % Apply 1  application topically 2 (two) times daily. Prn  . diphenhydrAMINE (BENADRYL) 25 mg capsule Take 25 mg by mouth every 6 (six) hours as needed (for allergies.).  Marland Kitchen DULoxetine (CYMBALTA) 20 MG capsule Take 1 capsule (20 mg total) by mouth daily.  . Elastic Bandages & Supports (LUMBAR BACK BRACE/SUPPORT PAD) MISC 1 lumbar back brace for lumbar facet pain  . EPINEPHrine (AUVI-Q) 0.3 mg/0.3 mL IJ SOAJ injection Use as directed for severe allergic reaction  . fluticasone (FLONASE) 50 MCG/ACT nasal spray Place 2 sprays into both nostrils daily.  . furosemide (LASIX) 20 MG tablet Take 1 tablet (20 mg total) by mouth daily. X 5-7 days  . hydrochlorothiazide (HYDRODIURIL) 25 MG tablet TAKE 1/2 TABLET BY MOUTH EVERY MORNING AS DIRECTED  . hydrocortisone 2.5 % cream Apply topically 2 (two) times daily. Prn to eyelids  . losartan (COZAAR) 100 MG tablet TAKE ONE TABLET EVERY DAY  . meloxicam (MOBIC) 15 MG tablet TAKE ONE TABLET EVERY DAY  . methocarbamol (ROBAXIN) 750 MG tablet Take 1 tablet (750 mg total) by mouth every 8 (eight) hours as needed for muscle spasms.  . montelukast (SINGULAIR) 10 MG tablet Take 1 tablet (10 mg total) by mouth at bedtime.  . Olopatadine HCl 0.2 % SOLN Apply 1 drop to eye daily. Both eyes  . pantoprazole (PROTONIX) 40 MG tablet TAKE ONE TABLET EVERY DAY 30 MIN BEFORE DINNER  . phentermine (ADIPEX-P) 37.5 MG tablet Take 1 tablet (37.5 mg total) by mouth daily before breakfast. rx 1/2  . potassium chloride SA (KLOR-CON) 20 MEQ tablet Take 1 tablet (20 mEq total) by mouth daily.  . pregabalin (LYRICA) 75 MG capsule Take 1 capsule (75 mg total) by mouth 3 (three) times daily.   Allergies  Allergen Reactions  . Other Hives    White and red sauces  . Pineapple     Swelling   . Shellfish Allergy Hives  . Strawberry (Diagnostic) Hives  . Tomato Hives    cherry   Recent Results (from the past 2160 hour(s))  Comprehensive metabolic panel     Status: Abnormal   Collection Time:  01/24/21 11:02 AM  Result Value Ref Range   Sodium 138 135 - 145 mEq/L   Potassium 4.1 3.5 - 5.1 mEq/L   Chloride 101 96 - 112 mEq/L   CO2 29 19 - 32 mEq/L   Glucose, Bld 95 70 - 99 mg/dL   BUN 16 6 - 23 mg/dL   Creatinine, Ser 0.90 0.40 - 1.20 mg/dL  Total Bilirubin 0.8 0.2 - 1.2 mg/dL   Alkaline Phosphatase 61 39 - 117 U/L   AST 58 (H) 0 - 37 U/L   ALT 123 (H) 0 - 35 U/L   Total Protein 7.5 6.0 - 8.3 g/dL   Albumin 4.2 3.5 - 5.2 g/dL   GFR 76.11 >60.00 mL/min    Comment: Calculated using the CKD-EPI Creatinine Equation (2021)   Calcium 9.8 8.4 - 10.5 mg/dL  Lipid panel     Status: Abnormal   Collection Time: 01/24/21 11:02 AM  Result Value Ref Range   Cholesterol 171 0 - 200 mg/dL    Comment: ATP III Classification       Desirable:  < 200 mg/dL               Borderline High:  200 - 239 mg/dL          High:  > = 240 mg/dL   Triglycerides 68.0 0.0 - 149.0 mg/dL    Comment: Normal:  <150 mg/dLBorderline High:  150 - 199 mg/dL   HDL 57.00 >39.00 mg/dL   VLDL 13.6 0.0 - 40.0 mg/dL   LDL Cholesterol 100 (H) 0 - 99 mg/dL   Total CHOL/HDL Ratio 3     Comment:                Men          Women1/2 Average Risk     3.4          3.3Average Risk          5.0          4.42X Average Risk          9.6          7.13X Average Risk          15.0          11.0                       NonHDL 113.86     Comment: NOTE:  Non-HDL goal should be 30 mg/dL higher than patient's LDL goal (i.e. LDL goal of < 70 mg/dL, would have non-HDL goal of < 100 mg/dL)  CBC with Differential/Platelet     Status: None   Collection Time: 01/24/21 11:02 AM  Result Value Ref Range   WBC 5.4 4.0 - 10.5 K/uL   RBC 3.97 3.87 - 5.11 Mil/uL   Hemoglobin 12.9 12.0 - 15.0 g/dL   HCT 38.0 36.0 - 46.0 %   MCV 95.6 78.0 - 100.0 fl   MCHC 34.0 30.0 - 36.0 g/dL   RDW 14.2 11.5 - 15.5 %   Platelets 254.0 150.0 - 400.0 K/uL   Neutrophils Relative % 52.8 43.0 - 77.0 %   Lymphocytes Relative 38.0 12.0 - 46.0 %   Monocytes Relative  7.2 3.0 - 12.0 %   Eosinophils Relative 1.5 0.0 - 5.0 %   Basophils Relative 0.5 0.0 - 3.0 %   Neutro Abs 2.9 1.4 - 7.7 K/uL   Lymphs Abs 2.1 0.7 - 4.0 K/uL   Monocytes Absolute 0.4 0.1 - 1.0 K/uL   Eosinophils Absolute 0.1 0.0 - 0.7 K/uL   Basophils Absolute 0.0 0.0 - 0.1 K/uL  TSH     Status: None   Collection Time: 01/24/21 11:02 AM  Result Value Ref Range   TSH 2.77 0.35 - 4.50 uIU/mL  Hemoglobin A1c     Status: None   Collection Time:  01/24/21 11:02 AM  Result Value Ref Range   Hgb A1c MFr Bld 6.3 4.6 - 6.5 %    Comment: Glycemic Control Guidelines for People with Diabetes:Non Diabetic:  <6%Goal of Therapy: <7%Additional Action Suggested:  >8%   Vitamin D (25 hydroxy)     Status: None   Collection Time: 01/24/21 11:02 AM  Result Value Ref Range   VITD 54.97 30.00 - 100.00 ng/mL  B Nat Peptide     Status: None   Collection Time: 01/24/21 11:02 AM  Result Value Ref Range   Pro B Natriuretic peptide (BNP) 7.0 0.0 - 100.0 pg/mL  Antinuclear Antib (ANA)     Status: Abnormal   Collection Time: 01/24/21 11:02 AM  Result Value Ref Range   Anti Nuclear Antibody (ANA) POSITIVE (A) NEGATIVE    Comment: ANA IFA is a first line screen for detecting the presence of up to approximately 150 autoantibodies in various autoimmune diseases. A positive ANA IFA result is suggestive of autoimmune disease and reflexes to titer and pattern. Further laboratory testing may be considered if clinically indicated. . For additional information, please refer to http://education.[facility].com/faq/FAQ177 (This link is being provided for informational/ educational purposes only.) .   Sedimentation rate     Status: Abnormal   Collection Time: 01/24/21 11:02 AM  Result Value Ref Range   Sed Rate 37 (H) 0 - 20 mm/hr  C-reactive protein     Status: None   Collection Time: 01/24/21 11:02 AM  Result Value Ref Range   CRP <1.0 0.5 - 95.2 mg/dL  Cyclic citrul peptide antibody, IgG ([facility])      Status: None   Collection Time: 01/24/21 11:02 AM  Result Value Ref Range   Cyclic Citrullin Peptide Ab <16 UNITS    Comment: Reference Range Negative:            <20 Weak Positive:       20-39 Moderate Positive:   40-59 Strong Positive:     >59 .   Rheumatoid Factor     Status: None   Collection Time: 01/24/21 11:02 AM  Result Value Ref Range   Rhuematoid fact SerPl-aCnc <14 <14 IU/mL  Folate     Status: None   Collection Time: 01/24/21 11:02 AM  Result Value Ref Range   Folate 7.9 >5.9 ng/mL  Anti-nuclear ab-titer (ANA titer)     Status: Abnormal   Collection Time: 01/24/21 11:02 AM  Result Value Ref Range   ANA Titer 1 1:80 (H) titer    Comment: A low level ANA titer may be present in pre-clinical autoimmune diseases and normal individuals.                 Reference Range                 <1:40        Negative                 1:40-1:80    Low Antibody Level                 >1:80        Elevated Antibody Level .    ANA Pattern 1 Nuclear, Homogeneous (A)     Comment: Homogeneous pattern is associated with systemic lupus erythematosus (SLE), drug-induced lupus and juvenile idiopathic arthritis. . AC-1: Homogeneous . International Consensus on ANA Patterns (https://www.hernandez-brewer.com/)    Objective  Body mass index is 44.75 kg/m. Wt Readings from Last 3 Encounters:  01/24/21  252 lb 9.6 oz (114.6 kg)  01/03/21 248 lb 3.2 oz (112.6 kg)  01/01/21 243 lb (110.2 kg)   Temp Readings from Last 3 Encounters:  01/24/21 98 F (36.7 C) (Oral)  01/03/21 98.3 F (36.8 C) (Oral)  01/01/21 (!) 97 F (36.1 C) (Temporal)   BP Readings from Last 3 Encounters:  01/24/21 110/74  01/03/21 108/62  01/01/21 (!) 136/58   Pulse Readings from Last 3 Encounters:  01/24/21 69  01/03/21 86  01/01/21 76    Physical Exam Vitals and nursing note reviewed.  Constitutional:      Appearance: Normal appearance. She is well-developed and well-groomed. She is morbidly  obese.  HENT:     Head: Normocephalic and atraumatic.  Cardiovascular:     Rate and Rhythm: Normal rate and regular rhythm.     Heart sounds: Normal heart sounds. No murmur heard.   Pulmonary:     Effort: Pulmonary effort is normal.     Breath sounds: Normal breath sounds.  Abdominal:     Tenderness: There is no abdominal tenderness.  Skin:    General: Skin is warm and dry.  Neurological:     General: No focal deficit present.     Mental Status: She is alert and oriented to person, place, and time. Mental status is at baseline.     Gait: Gait normal.     Comments: Monofilament does not feel b/l arms/legs  Psychiatric:        Attention and Perception: Attention and perception normal.        Mood and Affect: Mood and affect normal.        Speech: Speech normal.        Behavior: Behavior normal. Behavior is cooperative.        Thought Content: Thought content normal.        Cognition and Memory: Cognition and memory normal.        Judgment: Judgment normal.     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 s/p back surgery with b/l weakness legs and numbness tingling R>L 01/07/21 EMG/NCS- Plan: DULoxetine (CYMBALTA) 20 MG capsule, Comprehensive metabolic panel, CBC with Differential/Platelet, TSH, MR Lumbar Spine Wo Contrast On lyrica 75 tid  Anxiety and depression - Plan: DULoxetine (CYMBALTA) 20 MG capsule Consider therapy/psych with [facility] no referral needed   Prediabetes - Plan: Hemoglobin A1c   SOB (shortness of breath) - Plan: B Nat Peptide  Polyarthralgia and  parathesia - Plan: Antinuclear Antib (ANA), Sedimentation rate, C-reactive protein, Cyclic citrul peptide antibody, IgG ([facility]), Rheumatoid Factor Folate deficiency - Plan: C-reactive protein, Folate Consider SPEP/UPEP if continues but no other sx's  Consider alpha lipoic acid 600 mg bid   Weakness of both lower extremities - Plan: MR Lumbar Spine Wo Contrast Back pain with history of spinal surgery - Plan: MR Lumbar Spine Wo Contrast Lumbosacral radiculopathy at L5 - Plan: MR Lumbar Spine Wo Contrast    ANA + 01/24/21  Results for ALETHIA, MELENDREZ (MRN [id number]) as of 02/01/2021 13:01  Ref. Range 01/24/2021 11:02  Anti Nuclear Antibody (ANA) Latest Ref Range: NEGATIVE  POSITIVE (  A)  ANA Pattern 1 Unknown Nuclear, Homogeneous (A)  ANA Titer 1 Latest Units: titer 9:37 (H)  Cyclic Citrullin Peptide Ab Latest Units: UNITS <16  RA Latex Turbid. Latest Ref Range: <14 IU/mL <14   HM Flu shot12/2021 Tdaputd  covid vaccinepfizer 3/3   mammo1/29/21 normal ordered, 10/12/20 mammogram rec right breast US b/l screening 10/23/20 rec screening in 1 year  3/18/2021colonoscopy - GERD hyperplastic polyp f/u in 5 years  02/26/18 neg neg hpv h/o abnl h/o fibroids Dr.Song right female pelvic pain  Referred today   rec healthy diet and exercise   Provider: Dr. Olivia Mackie McLean-Scocuzza-Internal Medicine

## 2021-01-24 NOTE — Patient Instructions (Addendum)
[facility]  [address] [place] [address] [phone]  Call if they arent calling you   Let me know if referral needed to hand ortho  Consider [facility]  Dr. Amedeo Plenty or Dr. Jaquita Folds lipoic acid 600 mg 2x per day    Try wrist brace from [facility] for carpal tunnel   B complex vitamin  [facility]  [place] [id number] [phone]   [facility]  [address]  [place] [address]  [phone]   Duloxetine Delayed-Release Capsules What is this medicine? DULOXETINE (doo LOX e teen) is used to treat depression, anxiety, and different types of chronic pain. This medicine may be used for other purposes; ask your health care provider or pharmacist if you have questions. COMMON BRAND NAME(S): Cymbalta, Creig Hines, Irenka What should I tell my health care provider before I take this medicine? They need to know if you have any of these conditions:  bipolar disorder  glaucoma  high blood pressure  kidney disease  liver disease  seizures  suicidal thoughts, plans or attempt; a previous suicide attempt by you or a family member  take medicines that treat or prevent blood clots  taken medicines called MAOIs like Carbex, Eldepryl, Marplan, Nardil, and Parnate within 14 days  trouble passing urine  an unusual reaction to duloxetine, other medicines, foods, dyes, or preservatives  pregnant or trying to get pregnant  breast-feeding How should I use this medicine? Take this medicine by mouth with a glass of water. Follow the directions on the prescription label. Do not crush, cut or chew some capsules of this medicine. Some capsules may be opened and sprinkled on applesauce. Check with your doctor or pharmacist if you are not sure. You can take this medicine with or without food. Take your medicine at regular intervals. Do not take your  medicine more often than directed. Do not stop taking this medicine suddenly except upon the advice of your doctor. Stopping this medicine too quickly may cause serious side effects or your condition may worsen. A special MedGuide will be given to you by the pharmacist with each prescription and refill. Be sure to read this information carefully each time. Talk to your pediatrician regarding the use of this medicine in children. While this drug may be prescribed for children as young as 24 years of age for selected conditions, precautions do apply. Overdosage: If you think you have taken too much of this medicine contact a poison control center or emergency room at once. NOTE: This medicine is only for you. Do not share this medicine with others. What if I miss a dose? If you miss a dose, take it as soon as you can. If it is almost time for your next dose, take only that dose. Do not take double or extra doses. What may interact with this medicine? Do not take this medicine with any of the following medications:  desvenlafaxine  levomilnacipran  linezolid  MAOIs like Carbex, Eldepryl, Emsam, Marplan, Nardil, and Parnate  methylene blue (injected into a vein)  milnacipran  safinamide  thioridazine  venlafaxine  viloxazine This medicine may also interact with the following medications:  alcohol  amphetamines  aspirin and aspirin-like medicines  certain antibiotics like ciprofloxacin and enoxacin  certain medicines for blood pressure, heart disease, irregular heart beat  certain medicines for depression, anxiety, or psychotic disturbances  certain medicines  for migraine headache like almotriptan, eletriptan, frovatriptan, naratriptan, rizatriptan, sumatriptan, zolmitriptan  certain medicines that treat or prevent blood clots like warfarin, enoxaparin, and dalteparin  cimetidine  fentanyl  lithium  NSAIDS, medicines for pain and inflammation, like ibuprofen or  naproxen  phentermine  procarbazine  rasagiline  sibutramine  St. John's wort  theophylline  tramadol  tryptophan This list may not describe all possible interactions. Give your health care provider a list of all the medicines, herbs, non-prescription drugs, or dietary supplements you use. Also tell them if you smoke, drink alcohol, or use illegal drugs. Some items may interact with your medicine. What should I watch for while using this medicine? Tell your doctor if your symptoms do not get better or if they get worse. Visit your doctor or healthcare provider for regular checks on your progress. Because it may take several weeks to see the full effects of this medicine, it is important to continue your treatment as prescribed by your doctor. This medicine may cause serious skin reactions. They can happen weeks to months after starting the medicine. Contact your healthcare provider right away if you notice fevers or flu-like symptoms with a rash. The rash may be red or purple and then turn into blisters or peeling of the skin. Or, you might notice a red rash with swelling of the face, lips, or lymph nodes in your neck or under your arms. Patients and their families should watch out for new or worsening thoughts of suicide or depression. Also watch out for sudden changes in feelings such as feeling anxious, agitated, panicky, irritable, hostile, aggressive, impulsive, severely restless, overly excited and hyperactive, or not being able to sleep. If this happens, especially at the beginning of treatment or after a change in dose, call your healthcare provider. You may get drowsy or dizzy. Do not drive, use machinery, or do anything that needs mental alertness until you know how this medicine affects you. Do not stand or sit up quickly, especially if you are an older patient. This reduces the risk of dizzy or fainting spells. Alcohol may interfere with the effect of this medicine. Avoid alcoholic  drinks. This medicine can cause an increase in blood pressure. This medicine can also cause a sudden drop in your blood pressure, which may make you feel faint and increase the chance of a fall. These effects are most common when you first start the medicine or when the dose is increased, or during use of other medicines that can cause a sudden drop in blood pressure. Check with your doctor for instructions on monitoring your blood pressure while taking this medicine. Your mouth may get dry. Chewing sugarless gum or sucking hard candy, and drinking plenty of water, may help. Contact your doctor if the problem does not go away or is severe. What side effects may I notice from receiving this medicine? Side effects that you should report to your doctor or health care professional as soon as possible:  allergic reactions like skin rash, itching or hives, swelling of the face, lips, or tongue  anxious  breathing problems  confusion  changes in vision  chest pain  confusion  elevated mood, decreased need for sleep, racing thoughts, impulsive behavior  eye pain  fast, irregular heartbeat  feeling faint or lightheaded, falls  feeling agitated, angry, or irritable  hallucination, loss of contact with reality  high blood pressure  loss of balance or coordination  palpitations  redness, blistering, peeling or loosening of the skin, including  inside the mouth  restlessness, pacing, inability to keep still  seizures  stiff muscles  suicidal thoughts or other mood changes  trouble passing urine or change in the amount of urine  trouble sleeping  unusual bleeding or bruising  unusually weak or tired  vomiting  yellowing of the eyes or skin Side effects that usually do not require medical attention (report to your doctor or health care professional if they continue or are bothersome):  change in sex drive or performance  change in appetite or  weight  constipation  dizziness  dry mouth  headache  increased sweating  nausea  tired This list may not describe all possible side effects. Call your doctor for medical advice about side effects. You may report side effects to FDA at 1-800-FDA-1088. Where should I keep my medicine? Keep out of the reach of children and pets. Store at room temperature between 15 and 30 degrees C (59 to 86 degrees F). Get rid of any unused medicine after the expiration date. To get rid of medicines that are no longer needed or have expired:  Take the medicine to a medicine take-back program. Check with your pharmacy or law enforcement to find a location.  If you cannot return the medicine, check the label or package insert to see if the medicine should be thrown out in the garbage or flushed down the toilet. If you are not sure, ask your health care provider. If it is safe to put it in the trash, take the medicine out of the container. Mix the medicine with cat litter, dirt, coffee grounds, or other unwanted substance. Seal the mixture in a bag or container. Put it in the trash. NOTE: This sheet is a summary. It may not cover all possible information. If you have questions about this medicine, talk to your doctor, pharmacist, or health care provider.  2021 Elsevier/Gold Standard (2020-07-05 16:06:16)    Managing Anxiety, Adult After being diagnosed with an anxiety disorder, you may be relieved to know why you have felt or behaved a certain way. You may also feel overwhelmed about the treatment ahead and what it will mean for your life. With care and support, you can manage this condition and recover from it. How to manage lifestyle changes Managing stress and anxiety Stress is your body's reaction to life changes and events, both good and bad. Most stress will last just a few hours, but stress can be ongoing and can lead to more than just stress. Although stress can play a major role in anxiety, it  is not the same as anxiety. Stress is usually caused by something external, such as a deadline, test, or competition. Stress normally passes after the triggering event has ended.  Anxiety is caused by something internal, such as imagining a terrible outcome or worrying that something will go wrong that will devastate you. Anxiety often does not go away even after the triggering event is over, and it can become long-term (chronic) worry. It is important to understand the differences between stress and anxiety and to manage your stress effectively so that it does not lead to an anxious response. Talk with your health care provider or a counselor to learn more about reducing anxiety and stress. He or she may suggest tension reduction techniques, such as:  Music therapy. This can include creating or listening to music that you enjoy and that inspires you.  Mindfulness-based meditation. This involves being aware of your normal breaths while not trying to control  your breathing. It can be done while sitting or walking.  Centering prayer. This involves focusing on a word, phrase, or sacred image that means something to you and brings you peace.  Deep breathing. To do this, expand your stomach and inhale slowly through your nose. Hold your breath for 3-5 seconds. Then exhale slowly, letting your stomach muscles relax.  Self-talk. This involves identifying thought patterns that lead to anxiety reactions and changing those patterns.  Muscle relaxation. This involves tensing muscles and then relaxing them. Choose a tension reduction technique that suits your lifestyle and personality. These techniques take time and practice. Set aside 5-15 minutes a day to do them. Therapists can offer counseling and training in these techniques. The training to help with anxiety may be covered by some insurance plans. Other things you can do to manage stress and anxiety include:  Keeping a stress/anxiety diary. This can help  you learn what triggers your reaction and then learn ways to manage your response.  Thinking about how you react to certain situations. You may not be able to control everything, but you can control your response.  Making time for activities that help you relax and not feeling guilty about spending your time in this way.  Visual imagery and yoga can help you stay calm and relax.   Medicines Medicines can help ease symptoms. Medicines for anxiety include:  Anti-anxiety drugs.  Antidepressants. Medicines are often used as a primary treatment for anxiety disorder. Medicines will be prescribed by a health care provider. When used together, medicines, psychotherapy, and tension reduction techniques may be the most effective treatment. Relationships Relationships can play a big part in helping you recover. Try to spend more time connecting with trusted friends and family members. Consider going to couples counseling, taking family education classes, or going to family therapy. Therapy can help you and others better understand your condition. How to recognize changes in your anxiety Everyone responds differently to treatment for anxiety. Recovery from anxiety happens when symptoms decrease and stop interfering with your daily activities at home or work. This may mean that you will start to:  Have better concentration and focus. Worry will interfere less in your daily thinking.  Sleep better.  Be less irritable.  Have more energy.  Have improved memory. It is important to recognize when your condition is getting worse. Contact your health care provider if your symptoms interfere with home or work and you feel like your condition is not improving. Follow these instructions at home: Activity  Exercise. Most adults should do the following: ? Exercise for at least 150 minutes each week. The exercise should increase your heart rate and make you sweat (moderate-intensity  exercise). ? Strengthening exercises at least twice a week.  Get the right amount and quality of sleep. Most adults need 7-9 hours of sleep each night. Lifestyle  Eat a healthy diet that includes plenty of vegetables, fruits, whole grains, low-fat dairy products, and lean protein. Do not eat a lot of foods that are high in solid fats, added sugars, or salt.  Make choices that simplify your life.  Do not use any products that contain nicotine or tobacco, such as cigarettes, e-cigarettes, and chewing tobacco. If you need help quitting, ask your health care provider.  Avoid caffeine, alcohol, and certain over-the-counter cold medicines. These may make you feel worse. Ask your pharmacist which medicines to avoid.   General instructions  Take over-the-counter and prescription medicines only as told by your health care  provider.  Keep all follow-up visits as told by your health care provider. This is important. Where to find support You can get help and support from these sources:  Self-help groups.  Online and [employer].  A trusted spiritual leader.  Couples counseling.  Family education classes.  Family therapy. Where to find more information You may find that joining a support group helps you deal with your anxiety. The following sources can help you locate counselors or support groups near you:  [place]: www.mentalhealthamerica.net  Anxiety and Depression Association of [place] (ADAA): https://www.clark.net/  National Alliance on Mental Illness (NAMI): www.nami.org Contact a health care provider if you:  Have a hard time staying focused or finishing daily tasks.  Spend many hours a day feeling worried about everyday life.  Become exhausted by worry.  Start to have headaches, feel tense, or have nausea.  Urinate more than normal.  Have diarrhea. Get help right away if you have:  A racing heart and shortness of breath.  Thoughts of hurting  yourself or others. If you ever feel like you may hurt yourself or others, or have thoughts about taking your own life, get help right away. You can go to your nearest emergency department or call:  Your local emergency services (911 in the [place]  A suicide crisis helpline, such as the [place] at 678-668-2475. This is open 24 hours a day. Summary  Taking steps to learn and use tension reduction techniques can help calm you and help prevent triggering an anxiety reaction.  When used together, medicines, psychotherapy, and tension reduction techniques may be the most effective treatment.  Family, friends, and partners can play a big part in helping you recover from an anxiety disorder. This information is not intended to replace advice given to you by your health care provider. Make sure you discuss any questions you have with your health care provider. Document Revised: 01/18/2019 Document Reviewed: 01/18/2019 Elsevier Patient Education  2021 [place] Syndrome  Carpal tunnel syndrome is a condition that causes pain, numbness, and weakness in your hand and fingers. The carpal tunnel is a narrow area located on the palm side of your wrist. Repeated wrist motion or certain diseases may cause swelling within the tunnel. This swelling pinches the main nerve in the wrist. The main nerve in the wrist is called the median nerve. What are the causes? This condition may be caused by:  Repeated and forceful wrist and hand motions.  Wrist injuries.  Arthritis.  A cyst or tumor in the carpal tunnel.  Fluid buildup during pregnancy.  Use of tools that vibrate. Sometimes the cause of this condition is not known. What increases the risk? The following factors may make you more likely to develop this condition:  Having a job that requires you to repeatedly or forcefully move your wrist or hand or requires you to use tools that vibrate. This may  include jobs that involve using computers, working on an [employer], or working with [place] such as Pension scheme manager.  Being a woman.  Having certain conditions, such as: ? Diabetes. ? Obesity. ? An underactive thyroid (hypothyroidism). ? Kidney failure. ? Rheumatoid arthritis. What are the signs or symptoms? Symptoms of this condition include:  A tingling feeling in your fingers, especially in your thumb, index, and middle fingers.  Tingling or numbness in your hand.  An aching feeling in your entire arm, especially when your wrist and elbow are  bent for a long time.  Wrist pain that goes up your arm to your shoulder.  Pain that goes down into your palm or fingers.  A weak feeling in your hands. You may have trouble grabbing and holding items. Your symptoms may feel worse during the night. How is this diagnosed? This condition is diagnosed with a medical history and physical exam. You may also have tests, including:  Electromyogram (EMG). This test measures electrical signals sent by your nerves into the muscles.  Nerve conduction study. This test measures how well electrical signals pass through your nerves.  Imaging tests, such as X-rays, ultrasound, and MRI. These tests check for possible causes of your condition. How is this treated? This condition may be treated with:  Lifestyle changes. It is important to stop or change the activity that caused your condition.  Doing exercise and activities to strengthen and stretch your muscles and tendons (physical therapy).  Making lifestyle changes to help with your condition and learning how to do your daily activities safely (occupational therapy).  Medicines for pain and inflammation. This may include medicine that is injected into your wrist.  A wrist splint or brace.  Surgery. Follow these instructions at home: If you have a splint or brace:  Wear the splint or brace as told by your health care provider.  Remove it only as told by your health care provider.  Loosen the splint or brace if your fingers tingle, become numb, or turn cold and blue.  Keep the splint or brace clean.  If the splint or brace is not waterproof: ? Do not let it get wet. ? Cover it with a watertight covering when you take a bath or shower. Managing pain, stiffness, and swelling If directed, put ice on the painful area. To do this:  If you have a removeable splint or brace, remove it as told by your health care provider.  Put ice in a plastic bag.  Place a towel between your skin and the bag or between the splint or brace and the bag.  Leave the ice on for 20 minutes, 2-3 times a day. Do not fall asleep with the cold pack on your skin.  Remove the ice if your skin turns bright red. This is very important. If you cannot feel pain, heat, or cold, you have a greater risk of damage to the area. Move your fingers often to reduce stiffness and swelling.   General instructions  Take over-the-counter and prescription medicines only as told by your health care provider.  Rest your wrist and hand from any activity that may be causing your pain. If your condition is work related, talk with your employer about changes that can be made, such as getting a wrist pad to use while typing.  Do any exercises as told by your health care provider, physical therapist, or occupational therapist.  Keep all follow-up visits. This is important. Contact a health care provider if:  You have new symptoms.  Your pain is not controlled with medicines.  Your symptoms get worse. Get help right away if:  You have severe numbness or tingling in your wrist or hand. Summary  Carpal tunnel syndrome is a condition that causes pain, numbness, and weakness in your hand and fingers.  It is usually caused by repeated wrist motions.  Lifestyle changes and medicines are used to treat carpal tunnel syndrome. Surgery may be recommended.  Follow  your health care provider's instructions about wearing a splint, resting from activity,  keeping follow-up visits, and calling for help. This information is not intended to replace advice given to you by your health care provider. Make sure you discuss any questions you have with your health care provider. Document Revised: 12/29/2019 Document Reviewed: 12/29/2019 Elsevier Patient Education  2021 [place].  Peripheral Neuropathy Peripheral neuropathy is a type of nerve damage. It affects nerves that carry signals between the spinal cord and the arms, legs, and the rest of the body (peripheral nerves). It does not affect nerves in the spinal cord or brain. In peripheral neuropathy, one nerve or a group of nerves may be damaged. Peripheral neuropathy is a broad category that includes many specific nerve disorders, like diabetic neuropathy, hereditary neuropathy, and carpal tunnel syndrome. What are the causes? This condition may be caused by:  Diabetes. This is the most common cause of peripheral neuropathy.  Nerve injury.  Pressure or stress on a nerve that lasts a long time.  Lack (deficiency) of B vitamins. This can result from alcoholism, poor diet, or a restricted diet.  Infections.  Autoimmune diseases, such as rheumatoid arthritis and systemic lupus erythematosus.  Nerve diseases that are passed from parent to child (inherited).  Some medicines, such as cancer medicines (chemotherapy).  Poisonous (toxic) substances, such as lead and mercury.  Too little blood flowing to the legs.  Kidney disease.  Thyroid disease. In some cases, the cause of this condition is not known. What are the signs or symptoms? Symptoms of this condition depend on which of your nerves is damaged. Common symptoms include:  Loss of feeling (numbness) in the feet, hands, or both.  Tingling in the feet, hands, or both.  Burning pain.  Very sensitive skin.  Weakness.  Not being able to move a  part of the body (paralysis).  Muscle twitching.  Clumsiness or poor coordination.  Loss of balance.  Not being able to control your bladder.  Feeling dizzy.  Sexual problems. How is this diagnosed? Diagnosing and finding the cause of peripheral neuropathy can be difficult. Your health care provider will take your medical history and do a physical exam. A neurological exam will also be done. This involves checking things that are affected by your brain, spinal cord, and nerves (nervous system). For example, your health care provider will check your reflexes, how you move, and what you can feel. You may have other tests, such as:  Blood tests.  Electromyogram (EMG) and nerve conduction tests. These tests check nerve function and how well the nerves are controlling the muscles.  Imaging tests, such as CT scans or MRI to rule out other causes of your symptoms.  Removing a small piece of nerve to be examined in a lab (nerve biopsy).  Removing and examining a small amount of the fluid that surrounds the brain and spinal cord (lumbar puncture). How is this treated? Treatment for this condition may involve:  Treating the underlying cause of the neuropathy, such as diabetes, kidney disease, or vitamin deficiencies.  Stopping medicines that can cause neuropathy, such as chemotherapy.  Medicine to help relieve pain. Medicines may include: ? Prescription or over-the-counter pain medicine. ? Antiseizure medicine. ? Antidepressants. ? Pain-relieving patches that are applied to painful areas of skin.  Surgery to relieve pressure on a nerve or to destroy a nerve that is causing pain.  Physical therapy to help improve movement and balance.  Devices to help you move around (assistive devices). Follow these instructions at home: Medicines  Take over-the-counter and prescription medicines  only as told by your health care provider. Do not take any other medicines without first asking your  health care provider.  Do not drive or use heavy machinery while taking prescription pain medicine. Lifestyle  Do not use any products that contain nicotine or tobacco, such as cigarettes and e-cigarettes. Smoking keeps blood from reaching damaged nerves. If you need help quitting, ask your health care provider.  Avoid or limit alcohol. Too much alcohol can cause a vitamin B deficiency, and vitamin B is needed for healthy nerves.  Eat a healthy diet. This includes: ? Eating foods that are high in fiber, such as fresh fruits and vegetables, whole grains, and beans. ? Limiting foods that are high in fat and processed sugars, such as fried or sweet foods.   General instructions  If you have diabetes, work closely with your health care provider to keep your blood sugar under control.  If you have numbness in your feet: ? Check every day for signs of injury or infection. Watch for redness, warmth, and swelling. ? Wear padded socks and comfortable shoes. These help protect your feet.  Develop a good support system. Living with peripheral neuropathy can be stressful. Consider talking with a mental health specialist or joining a support group.  Use assistive devices and attend physical therapy as told by your health care provider. This may include using a walker or a cane.  Keep all follow-up visits as told by your health care provider. This is important.   Contact a health care provider if:  You have new signs or symptoms of peripheral neuropathy.  You are struggling emotionally from dealing with peripheral neuropathy.  Your pain is not well-controlled. Get help right away if:  You have an injury or infection that is not healing normally.  You develop new weakness in an arm or leg.  You have fallen or do so frequently. Summary  Peripheral neuropathy is when the nerves in the arms, or legs are damaged, resulting in numbness, weakness, or pain.  There are many causes of peripheral  neuropathy, including diabetes, pinched nerves, vitamin deficiencies, autoimmune disease, and hereditary conditions.  Diagnosing and finding the cause of peripheral neuropathy can be difficult. Your health care provider will take your medical history, do a physical exam, and do tests, including blood tests and nerve function tests.  Treatment involves treating the underlying cause of the neuropathy and taking medicines to help control pain. Physical therapy and assistive devices may also help. This information is not intended to replace advice given to you by your health care provider. Make sure you discuss any questions you have with your health care provider. Document Revised: 05/29/2020 Document Reviewed: 05/29/2020 Elsevier Patient Education  2021 [employer].  Neuropathic Pain Neuropathic pain is pain caused by damage to the nerves that are responsible for certain sensations in your body (sensory nerves). The pain can be caused by:  Damage to the sensory nerves that send signals to your spinal cord and brain (peripheral nervous system).  Damage to the sensory nerves in your brain or spinal cord (central nervous system). Neuropathic pain can make you more sensitive to pain. Even a minor sensation can feel very painful. This is usually a long-term condition that can be difficult to treat. The type of pain differs from person to person. It may:  Start suddenly (acute), or it may develop slowly and last for a long time (chronic).  Come and go as damaged nerves heal, or it may stay at  the same level for years.  Cause emotional distress, loss of sleep, and a lower quality of life. What are the causes? The most common cause of this condition is diabetes. Many other diseases and conditions can also cause neuropathic pain. Causes of neuropathic pain can be classified as:  Toxic. This is caused by medicines and chemicals. The most common cause of toxic neuropathic pain is damage from cancer  treatments (chemotherapy).  Metabolic. This can be caused by: ? Diabetes. This is the most common disease that damages the nerves. ? Lack of vitamin B from long-term alcohol abuse.  Traumatic. Any injury that cuts, crushes, or stretches a nerve can cause damage and pain. A common example is feeling pain after losing an arm or leg (phantom limb pain).  Compression-related. If a sensory nerve gets trapped or compressed for a long period of time, the blood supply to the nerve can be cut off.  Vascular. Many blood vessel diseases can cause neuropathic pain by decreasing blood supply and oxygen to nerves.  Autoimmune. This type of pain results from diseases in which the body's defense system (immune system) mistakenly attacks sensory nerves. Examples of autoimmune diseases that can cause neuropathic pain include lupus and multiple sclerosis.  Infectious. Many types of viral infections can damage sensory nerves and cause pain. Shingles infection is a common cause of this type of pain.  Inherited. Neuropathic pain can be a symptom of many diseases that are passed down through families (genetic). What increases the risk? You are more likely to develop this condition if:  You have diabetes.  You smoke.  You drink too much alcohol.  You are taking certain medicines, including medicines that kill cancer cells (chemotherapy) or that treat immune system disorders. What are the signs or symptoms? The main symptom is pain. Neuropathic pain is often described as:  Burning.  Shock-like.  Stinging.  Hot or cold.  Itching. How is this diagnosed? No single test can diagnose neuropathic pain. It is diagnosed based on:  Physical exam and your symptoms. Your health care provider will ask you about your pain. You may be asked to use a pain scale to describe how bad your pain is.  Tests. These may be done to see if you have a high sensitivity to pain and to help find the cause and location of any  sensory nerve damage. They include: ? Nerve conduction studies to test how well nerve signals travel through your sensory nerves (electrodiagnostic testing). ? Stimulating your sensory nerves through electrodes on your skin and measuring the response in your spinal cord and brain (somatosensory evoked potential).  Imaging studies, such as: ? X-rays. ? CT scan. ? MRI. How is this treated? Treatment for neuropathic pain may change over time. You may need to try different treatment options or a combination of treatments. Some options include:  Treating the underlying cause of the neuropathy, such as diabetes, kidney disease, or vitamin deficiencies.  Stopping medicines that can cause neuropathy, such as chemotherapy.  Medicine to relieve pain. Medicines may include: ? Prescription or over-the-counter pain medicine. ? Anti-seizure medicine. ? Antidepressant medicines. ? Pain-relieving patches that are applied to painful areas of skin. ? A medicine to numb the area (local anesthetic), which can be injected as a nerve block.  Transcutaneous nerve stimulation. This uses electrical currents to block painful nerve signals. The treatment is painless.  Alternative treatments, such as: ? Acupuncture. ? Meditation. ? Massage. ? Physical therapy. ? Pain management programs. ? Counseling. Follow  these instructions at home: Medicines  Take over-the-counter and prescription medicines only as told by your health care provider.  Do not drive or use heavy machinery while taking prescription pain medicine.  If you are taking prescription pain medicine, take actions to prevent or treat constipation. Your health care provider may recommend that you: ? Drink enough fluid to keep your urine pale yellow. ? Eat foods that are high in fiber, such as fresh fruits and vegetables, whole grains, and beans. ? Limit foods that are high in fat and processed sugars, such as fried or sweet foods. ? Take an  over-the-counter or prescription medicine for constipation.   Lifestyle  Have a good support system at home.  Consider joining a chronic pain support group.  Do not use any products that contain nicotine or tobacco, such as cigarettes and e-cigarettes. If you need help quitting, ask your health care provider.  Do not drink alcohol.   General instructions  Learn as much as you can about your condition.  Work closely with all your health care providers to find the treatment plan that works best for you.  Ask your health care provider what activities are safe for you.  Keep all follow-up visits as told by your health care provider. This is important. Contact a health care provider if:  Your pain treatments are not working.  You are having side effects from your medicines.  You are struggling with tiredness (fatigue), mood changes, depression, or anxiety. Summary  Neuropathic pain is pain caused by damage to the nerves that are responsible for certain sensations in your body (sensory nerves).  Neuropathic pain may come and go as damaged nerves heal, or it may stay at the same level for years.  Neuropathic pain is usually a long-term condition that can be difficult to treat. Consider joining a chronic pain support group. This information is not intended to replace advice given to you by your health care provider. Make sure you discuss any questions you have with your health care provider. Document Revised: 12/09/2018 Document Reviewed: 09/04/2017 Elsevier Patient Education  2021 [employer].

## 2021-01-29 DIAGNOSIS — G629 Polyneuropathy, unspecified: Secondary | ICD-10-CM | POA: Insufficient documentation

## 2021-01-29 DIAGNOSIS — G5603 Carpal tunnel syndrome, bilateral upper limbs: Secondary | ICD-10-CM

## 2021-01-29 DIAGNOSIS — Z9889 Other specified postprocedural states: Secondary | ICD-10-CM | POA: Insufficient documentation

## 2021-01-29 DIAGNOSIS — F419 Anxiety disorder, unspecified: Secondary | ICD-10-CM | POA: Insufficient documentation

## 2021-01-29 DIAGNOSIS — F32A Depression, unspecified: Secondary | ICD-10-CM

## 2021-01-29 DIAGNOSIS — R600 Localized edema: Secondary | ICD-10-CM

## 2021-01-29 DIAGNOSIS — M549 Dorsalgia, unspecified: Secondary | ICD-10-CM | POA: Insufficient documentation

## 2021-01-29 DIAGNOSIS — M5417 Radiculopathy, lumbosacral region: Secondary | ICD-10-CM | POA: Insufficient documentation

## 2021-01-29 HISTORY — DX: Polyneuropathy, unspecified: G62.9

## 2021-01-29 HISTORY — DX: Anxiety disorder, unspecified: F41.9

## 2021-01-29 HISTORY — DX: Dorsalgia, unspecified: M54.9

## 2021-01-29 HISTORY — DX: Depression, unspecified: F32.A

## 2021-01-29 HISTORY — DX: Localized edema: R60.0

## 2021-01-29 HISTORY — DX: Carpal tunnel syndrome, bilateral upper limbs: G56.03

## 2021-01-29 LAB — CYCLIC CITRUL PEPTIDE ANTIBODY, IGG: Cyclic Citrullin Peptide Ab: <16 UNITS

## 2021-01-29 LAB — RHEUMATOID FACTOR: Rheumatoid fact SerPl-aCnc: <14 IU/mL (ref <14)

## 2021-01-29 LAB — ANTI-NUCLEAR AB-TITER (ANA TITER): ANA Titer 1: 1:80 {titer} — ABNORMAL HIGH

## 2021-01-29 LAB — ANA: Anti Nuclear Antibody (ANA): POSITIVE — AB

## 2021-01-30 ENCOUNTER — Other Ambulatory Visit: Payer: Self-pay

## 2021-01-30 DIAGNOSIS — R748 Abnormal levels of other serum enzymes: Secondary | ICD-10-CM

## 2021-01-31 ENCOUNTER — Ambulatory Visit: Payer: BC Managed Care – PPO | Admitting: Internal Medicine

## 2021-02-01 ENCOUNTER — Other Ambulatory Visit: Payer: Self-pay | Admitting: Internal Medicine

## 2021-02-01 ENCOUNTER — Telehealth: Payer: Self-pay | Admitting: Internal Medicine

## 2021-02-01 DIAGNOSIS — R768 Other specified abnormal immunological findings in serum: Secondary | ICD-10-CM | POA: Insufficient documentation

## 2021-02-01 HISTORY — DX: Other specified abnormal immunological findings in serum: R76.8

## 2021-02-01 NOTE — Telephone Encounter (Signed)
lft vm for pt to call ofc to sch US abdomen.thanks 

## 2021-02-01 NOTE — Addendum Note (Signed)
Addended by: Orland Mustard on: 02/01/2021 01:05 PM   Modules accepted: Orders

## 2021-02-01 NOTE — Addendum Note (Signed)
Addended by: Orland Mustard on: 02/01/2021 01:07 PM   Modules accepted: Orders

## 2021-02-04 ENCOUNTER — Encounter: Payer: Self-pay | Admitting: Internal Medicine

## 2021-02-04 NOTE — Addendum Note (Signed)
Addended by: Orland Mustard on: 02/04/2021 10:38 AM   Modules accepted: Orders

## 2021-02-04 NOTE — Telephone Encounter (Signed)
Patient last seen 01/24/21, okay for referral?

## 2021-02-08 ENCOUNTER — Telehealth: Payer: Self-pay | Admitting: Internal Medicine

## 2021-02-08 ENCOUNTER — Encounter: Payer: Self-pay | Admitting: Internal Medicine

## 2021-02-08 ENCOUNTER — Other Ambulatory Visit: Payer: Self-pay | Admitting: Internal Medicine

## 2021-02-08 DIAGNOSIS — R319 Hematuria, unspecified: Secondary | ICD-10-CM

## 2021-02-08 NOTE — Telephone Encounter (Signed)
Sch lab visit for urine check Monday here

## 2021-02-08 NOTE — Telephone Encounter (Signed)
Providing Access Nurse Documentation.   

## 2021-02-08 NOTE — Telephone Encounter (Signed)
Transfer to at Access Nurse  PT called in stating that she has blood in her urine. She states she has no pain or discomfort. Advised no appointments available for today.

## 2021-02-08 NOTE — Telephone Encounter (Signed)
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 She is not having any symptoms. No pain. No discomfort. She has not noticed any visible blood in urine or when she wipes. She has a lab appt on 6/15. Do you want to add a U/A to recheck and see if blood persists? Pt is agreeable to this if you are

## 2021-02-11 NOTE — Telephone Encounter (Signed)
Patient scheduled for labs 02/13/21

## 2021-02-12 ENCOUNTER — Ambulatory Visit
Admission: RE | Admit: 2021-02-12 | Discharge: 2021-02-12 | Disposition: A | Discharge status: Home / Self Care | Payer: BC Managed Care – PPO | Source: Ambulatory Visit | Attending: Internal Medicine | Admitting: Internal Medicine

## 2021-02-12 ENCOUNTER — Other Ambulatory Visit: Payer: Self-pay

## 2021-02-12 DIAGNOSIS — M5417 Radiculopathy, lumbosacral region: Secondary | ICD-10-CM

## 2021-02-12 DIAGNOSIS — Z9889 Other specified postprocedural states: Secondary | ICD-10-CM | POA: Diagnosis present

## 2021-02-12 DIAGNOSIS — R29898 Other symptoms and signs involving the musculoskeletal system: Secondary | ICD-10-CM | POA: Diagnosis present

## 2021-02-12 DIAGNOSIS — G629 Polyneuropathy, unspecified: Secondary | ICD-10-CM | POA: Diagnosis not present

## 2021-02-12 DIAGNOSIS — M549 Dorsalgia, unspecified: Secondary | ICD-10-CM | POA: Insufficient documentation

## 2021-02-13 ENCOUNTER — Ambulatory Visit (HOSPITAL_COMMUNITY)
Admission: RE | Admit: 2021-02-13 | Discharge: 2021-02-13 | Disposition: A | Discharge status: Home / Self Care | Payer: BC Managed Care – PPO | Source: Ambulatory Visit | Attending: Internal Medicine | Admitting: Internal Medicine

## 2021-02-13 ENCOUNTER — Other Ambulatory Visit (INDEPENDENT_AMBULATORY_CARE_PROVIDER_SITE_OTHER): Payer: BC Managed Care – PPO

## 2021-02-13 DIAGNOSIS — R748 Abnormal levels of other serum enzymes: Secondary | ICD-10-CM | POA: Diagnosis not present

## 2021-02-13 DIAGNOSIS — R319 Hematuria, unspecified: Secondary | ICD-10-CM

## 2021-02-13 LAB — COMPREHENSIVE METABOLIC PANEL
AG Ratio: 1.4 (calc) (ref 1.0–2.5)
ALT: 47 U/L — ABNORMAL HIGH (ref 6–29)
AST: 31 U/L (ref 10–35)
Albumin: 4.1 g/dL (ref 3.6–5.1)
Alkaline phosphatase (APISO): 62 U/L (ref 31–125)
BUN: 14 mg/dL (ref 7–25)
CO2: 25 mmol/L (ref 20–32)
Calcium: 9.1 mg/dL (ref 8.6–10.2)
Chloride: 102 mmol/L (ref 98–110)
Creat: 0.86 mg/dL (ref 0.50–1.10)
Globulin: 3 g/dL (calc) (ref 1.9–3.7)
Glucose, Bld: 122 mg/dL — ABNORMAL HIGH (ref 65–99)
Potassium: 3.4 mmol/L — ABNORMAL LOW (ref 3.5–5.3)
Sodium: 136 mmol/L (ref 135–146)
Total Bilirubin: 0.5 mg/dL (ref 0.2–1.2)
Total Protein: 7.1 g/dL (ref 6.1–8.1)

## 2021-02-14 LAB — URINALYSIS, ROUTINE W REFLEX MICROSCOPIC
Bilirubin Urine: NEGATIVE
Glucose, UA: NEGATIVE
Hgb urine dipstick: NEGATIVE
Ketones, ur: NEGATIVE
Leukocytes,Ua: NEGATIVE
Nitrite: NEGATIVE
Protein, ur: NEGATIVE
Specific Gravity, Urine: 1.012 (ref 1.001–1.035)
pH: 5.5 (ref 5.0–8.0)

## 2021-02-14 LAB — URINE CULTURE
MICRO NUMBER:: 12011004
SPECIMEN QUALITY:: ADEQUATE

## 2021-02-15 ENCOUNTER — Ambulatory Visit: Payer: BC Managed Care – PPO

## 2021-02-18 ENCOUNTER — Ambulatory Visit: Payer: BC Managed Care – PPO | Admitting: Internal Medicine

## 2021-02-18 ENCOUNTER — Other Ambulatory Visit: Payer: Self-pay

## 2021-02-18 ENCOUNTER — Telehealth: Payer: Self-pay

## 2021-02-18 ENCOUNTER — Encounter: Payer: Self-pay | Admitting: Internal Medicine

## 2021-02-18 VITALS — Ht 63.0 in | Wt 251.0 lb

## 2021-02-18 DIAGNOSIS — D3502 Benign neoplasm of left adrenal gland: Secondary | ICD-10-CM | POA: Diagnosis not present

## 2021-02-18 LAB — BASIC METABOLIC PANEL
BUN: 18 mg/dL (ref 6–23)
CO2: 28 mEq/L (ref 19–32)
Calcium: 9.3 mg/dL (ref 8.4–10.5)
Chloride: 104 mEq/L (ref 96–112)
Creatinine, Ser: 0.93 mg/dL (ref 0.40–1.20)
GFR: 73.14 mL/min (ref >60.00)
Glucose, Bld: 103 mg/dL — ABNORMAL HIGH (ref 70–99)
Potassium: 3.9 mEq/L (ref 3.5–5.1)
Sodium: 138 mEq/L (ref 135–145)

## 2021-02-18 NOTE — Telephone Encounter (Signed)
Attempted to call patient to ask which medication she needs, no answer, message left.

## 2021-02-18 NOTE — Telephone Encounter (Signed)
Pt states that pharmacy never received her new rx and can not refill her meds and she is completely out

## 2021-02-18 NOTE — Telephone Encounter (Signed)
States she needs a prescription for Pregabalin 75 mg, tid. I called [facility], they told me that is the one they have, and will fill it today. Patient informed of this.

## 2021-02-18 NOTE — Progress Notes (Signed)
Name: Anita Gibbs  MRN/ DOB: [id number], [birth year]    Age/ Sex: 50 y.o., female     PCP: McLean-Scocuzza, Nino Glow, MD   Reason for Endocrinology Evaluation: Left adrenal adenoma      Initial Endocrinology Clinic Visit: 07/31/2020    PATIENT IDENTIFIER: [name] is a 50 y.o., female with a past medical history of HTN and dyslipidemia . She has followed with [facility] since 07/31/2020 for consultative assistance with management of her Left adrenal adenoma .   HISTORICAL SUMMARY:   She had an incidental left adrenal adenoma on a CT scan in 06/2020 during evaluation for abdominal pain that measures 1.4 x 1.2 cm and 48 Hounsfield units.     SUBJECTIVE:     Today (02/18/2021):  Anita Gibbs is here for left adrenal adenoma    Substantial weight gain- gradual weight gain  Centripetal obesity- no Easy bruisbility- no Severe hypertension-no DM- no Proximal muscle weakness-yes she attributes this to knee and back pain Fatigue-  yes Sudden/ severe headaches- occasionally  Weight loss-no Anxiety attacks- yes Sweating- yes Cardiac arrhythmias- no  Palpitations- no  Fluid retention- no Hypokalemia- yes     No FH of adrenal problems Mother and sister with DM   HISTORY:  Past Medical History:  Past Medical History:  Diagnosis Date   Arthritis    Carpal boss, right    Carpal tunnel syndrome, bilateral    Headache    Hives 09/05/2020   Hypertension    Seasonal asthma 11/29/2018   Past Surgical History:  Past Surgical History:  Procedure Laterality Date   ABDOMINAL HYSTERECTOMY  10/17/2020   [facility]   ANTERIOR CERVICAL DECOMP/DISCECTOMY FUSION N/A 07/01/2017   Procedure: ANTERIOR CERVICAL DECOMPRESSION/DISCECTOMY FUSION 3 LEVELS;  Surgeon: Meade Maw, MD;  Location: [facility] ORS;  Service: Neurosurgery;  Laterality: N/A;   KNEE ARTHROSCOPY Right    Social History:  reports that she has never smoked. She has never used smokeless tobacco.  She reports current alcohol use. She reports that she does not use drugs. Family History:  Family History  Problem Relation Age of Onset   [name] Mother    Diabetes Mother    Arthritis Mother    Hypertension Mother    Hyperlipidemia Mother    Hypertension Father    Arthritis Father    Hearing loss Father    Kidney disease Father    Lymphoma Father        large b cell    Arthritis Sister    Hypertension Sister    Cancer Brother        ? lung due to exposure    COPD Brother    Early death Brother    COPD Son    Depression Son    Breast cancer Maternal Aunt    Breast cancer Paternal Aunt    Sleep apnea Daughter    Allergic rhinitis Neg Hx    Angioedema Neg Hx    Eczema Neg Hx    Urticaria Neg Hx    Asthma Neg Hx      HOME MEDICATIONS: Allergies as of 02/18/2021       Reactions   Other Hives   White and red sauces   Pineapple    Swelling   Shellfish Allergy Hives   Strawberry (diagnostic) Hives   Tomato Hives   cherry        Medication List        Accurate as of February 18, 2021  9:08 AM. If you have any questions, ask your nurse or doctor.          albuterol 108 (90 Base) MCG/ACT inhaler Commonly known as: VENTOLIN HFA Inhale 2 puffs into the lungs every 6 (six) hours as needed for wheezing or shortness of breath.   amLODipine 5 MG tablet Commonly known as: NORVASC Take 1 tablet (5 mg total) by mouth daily.   atorvastatin 10 MG tablet Commonly known as: LIPITOR TAKE 1 TABLET BY MOUTH DAILY AT 6PM   clobetasol cream 0.05 % Commonly known as: TEMOVATE Apply 1 application topically 2 (two) times daily. Prn   diphenhydrAMINE 25 mg capsule Commonly known as: BENADRYL Take 25 mg by mouth every 6 (six) hours as needed (for allergies.).   DULoxetine 20 MG capsule Commonly known as: Cymbalta Take 1 capsule (20 mg total) by mouth daily.   EPINEPHrine 0.3 mg/0.3 mL Soaj injection Commonly known as: Auvi-Q Use as directed for severe allergic  reaction   fluticasone 50 MCG/ACT nasal spray Commonly known as: FLONASE Place 2 sprays into both nostrils daily.   furosemide 20 MG tablet Commonly known as: LASIX Take 1 tablet (20 mg total) by mouth daily. X 5-7 days   hydrochlorothiazide 25 MG tablet Commonly known as: HYDRODIURIL TAKE 1/2 TABLET BY MOUTH EVERY MORNING AS DIRECTED   hydrocortisone 2.5 % cream Apply topically 2 (two) times daily. Prn to eyelids   losartan 100 MG tablet Commonly known as: COZAAR TAKE ONE TABLET EVERY DAY   Lumbar Back Brace/Support Pad Misc 1 lumbar back brace for lumbar facet pain   meloxicam 15 MG tablet Commonly known as: MOBIC TAKE ONE TABLET EVERY DAY   methocarbamol 750 MG tablet Commonly known as: ROBAXIN Take 1 tablet (750 mg total) by mouth every 8 (eight) hours as needed for muscle spasms.   montelukast 10 MG tablet Commonly known as: SINGULAIR Take 1 tablet (10 mg total) by mouth at bedtime.   Olopatadine HCl 0.2 % Soln Apply 1 drop to eye daily. Both eyes   pantoprazole 40 MG tablet Commonly known as: PROTONIX TAKE ONE TABLET EVERY DAY 30 MIN BEFORE DINNER   phentermine 37.5 MG tablet Commonly known as: Adipex-P Take 1 tablet (37.5 mg total) by mouth daily before breakfast. rx 1/2   potassium chloride SA 20 MEQ tablet Commonly known as: KLOR-CON Take 1 tablet (20 mEq total) by mouth daily.   pregabalin 75 MG capsule Commonly known as: Lyrica Take 1 capsule (75 mg total) by mouth 3 (three) times daily.   Vitamin D-3 125 MCG (5000 UT) Tabs Take by mouth daily.   ZYRTEC PO Take 25 mg by mouth.          OBJECTIVE:   PHYSICAL EXAM: VS: Ht 5\' 3"  (1.6 m)   Wt 251 lb (113.9 kg)   LMP  (LMP Unknown)   BMI 44.46 kg/m    EXAM: General: Pt appears well and is in NAD  Neck: General: Supple without adenopathy. Thyroid: Thyroid size normal.  No goiter or nodules appreciated.   Lungs: Clear with good BS bilat with no rales, rhonchi, or wheezes  Heart:  Auscultation: RRR.  Abdomen: Normoactive bowel sounds, soft, nontender, without masses or organomegaly palpable  Extremities:  BL LE: No pretibial edema normal ROM and strength.  Mental Status: Judgment, insight: Intact Orientation: Oriented to time, place, and person Memory: Intact for recent and remote events Mood and affect: No depression, anxiety, or agitation     DATA REVIEWED:  Results for Anita Gibbs, Anita Gibbs (MRN [id number]) as of 02/18/2021 16:48  Ref. Range 02/18/2021 09:34  Sodium Latest Ref Range: 135 - 145 mEq/L 138  Potassium Latest Ref Range: 3.5 - 5.1 mEq/L 3.9  Chloride Latest Ref Range: 96 - 112 mEq/L 104  CO2 Latest Ref Range: 19 - 32 mEq/L 28  Glucose Latest Ref Range: 70 - 99 mg/dL 103 (H)  BUN Latest Ref Range: 6 - 23 mg/dL 18  Creatinine Latest Ref Range: 0.40 - 1.20 mg/dL 0.93  Calcium Latest Ref Range: 8.4 - 10.5 mg/dL 9.3  GFR Latest Ref Range: >60.00 mL/min 73.14   Results for Anita Gibbs, Anita Gibbs (MRN [id number]) as of 02/18/2021 10:42  Ref. Range 08/06/2020 12:19  Cortisol (Ur), Free Latest Ref Range: 6 - 42 ug/24 hr 27  Results for Anita Gibbs, Anita Gibbs (MRN [id number]) as of 02/18/2021 10:42  Ref. Range 08/01/2020 09:59  ALDOSTERONE Latest Ref Range: 0.0 - 30.0 ng/dL 5.7  Renin Latest Ref Range: 0.167 - 5.380 ng/mL/hr 45.875 (H)  ALDOS/RENIN RATIO Latest Ref Range: 0.0 - 30.0  0.1     CT abdomen 06/12/2020  Adrenals/Urinary Tract: Normal right adrenal gland. Left adrenal nodule measures 1.4 x 1.2 cm and 48 Hounsfield units.  ASSESSMENT / PLAN / RECOMMENDATIONS:   Left adrenal adenoma    -We had screened her for hyperaldosteronemia as well as Cushing syndrome, both of these tests came back negative in 2021 -Today we will proceed with 24-hour urine collection for catecholamines/metanephrines  -I am also going to repeat imaging for left adrenal adenoma stability -Reassurance was provided today  Follow-up in 1 year   Signed electronically by: Mack Guise, MD  [facility]  [address] [place],  [address] Phone: [phone] FAX: [phone]      CC: McLean-Scocuzza, Nino Glow, MD [place] [address] Phone: [phone]  Fax: [phone]   Return to Endocrinology clinic as below: Future Appointments  Date Time Provider [place]  02/18/2021  9:10 AM Athalie Gibbs, Melanie Crazier, MD [facility] None  04/02/2021 11:20 AM Gillis Santa, MD [facility] None  07/31/2021 10:30 AM McLean-Scocuzza, Nino Glow, MD [facility]

## 2021-02-18 NOTE — Patient Instructions (Signed)

## 2021-02-19 ENCOUNTER — Encounter: Payer: Self-pay | Admitting: Internal Medicine

## 2021-02-27 ENCOUNTER — Telehealth: Payer: Self-pay | Admitting: Internal Medicine

## 2021-02-27 NOTE — Telephone Encounter (Signed)
Pharmacy states Patient informed them that she should be taking the Montelukast three times daily. RX is for once daily.   Please advise

## 2021-02-28 NOTE — Telephone Encounter (Signed)
PT would like a callback in regards to the medications.

## 2021-03-01 ENCOUNTER — Telehealth: Payer: Self-pay | Admitting: Internal Medicine

## 2021-03-01 NOTE — Telephone Encounter (Signed)
Patient informed and verbalized understanding.  States she was wanting the Meloxicam once daily prescribed by Dr Olivia Mackie McLean-Scocuzza as she no longer sees podiatry. States the medication helps with her arthritis.   Dr Olivia Mackie McLean-Scocuzza states she does not want Patient taking this daily. States she will prescribed daily as needed. States this could harm the kidneys.   Patient informed and verbalized understanding not to take daily.   Please advise how you would like script sent in.

## 2021-03-01 NOTE — Telephone Encounter (Signed)
Please advise 

## 2021-03-01 NOTE — Telephone Encounter (Signed)
Singulair is not 3x per day its 1 per day max

## 2021-03-01 NOTE — Telephone Encounter (Signed)
Looks as though a different provider fil;led medication. Please advise.

## 2021-03-01 NOTE — Telephone Encounter (Signed)
PT would like a refill of their Meloxicam 15 mg to be called into their Rx on file.

## 2021-03-04 ENCOUNTER — Other Ambulatory Visit: Payer: Self-pay | Admitting: Internal Medicine

## 2021-03-04 DIAGNOSIS — J309 Allergic rhinitis, unspecified: Secondary | ICD-10-CM | POA: Insufficient documentation

## 2021-03-04 DIAGNOSIS — M722 Plantar fascial fibromatosis: Secondary | ICD-10-CM | POA: Insufficient documentation

## 2021-03-04 HISTORY — DX: Plantar fascial fibromatosis: M72.2

## 2021-03-04 MED ORDER — MONTELUKAST SODIUM 10 MG PO TABS: 10.0000 mg | ORAL_TABLET | Freq: Every day | ORAL | 3 refills | Status: DC

## 2021-03-04 MED ORDER — MELOXICAM 15 MG PO TABS: 15.0000 mg | ORAL_TABLET | Freq: Every day | ORAL | 0 refills | Status: DC

## 2021-03-05 ENCOUNTER — Other Ambulatory Visit: Payer: Self-pay | Admitting: Internal Medicine

## 2021-03-05 DIAGNOSIS — M722 Plantar fascial fibromatosis: Secondary | ICD-10-CM

## 2021-03-18 ENCOUNTER — Other Ambulatory Visit: Payer: BC Managed Care – PPO

## 2021-03-18 DIAGNOSIS — D3502 Benign neoplasm of left adrenal gland: Secondary | ICD-10-CM

## 2021-03-19 ENCOUNTER — Encounter: Payer: Self-pay | Admitting: Student in an Organized Health Care Education/Training Program

## 2021-03-19 ENCOUNTER — Ambulatory Visit
Payer: BC Managed Care – PPO | Attending: Student in an Organized Health Care Education/Training Program | Admitting: Student in an Organized Health Care Education/Training Program

## 2021-03-19 ENCOUNTER — Other Ambulatory Visit: Payer: Self-pay

## 2021-03-19 ENCOUNTER — Encounter: Payer: Self-pay | Admitting: Internal Medicine

## 2021-03-19 DIAGNOSIS — G894 Chronic pain syndrome: Secondary | ICD-10-CM

## 2021-03-19 DIAGNOSIS — M792 Neuralgia and neuritis, unspecified: Secondary | ICD-10-CM

## 2021-03-19 DIAGNOSIS — M5416 Radiculopathy, lumbar region: Secondary | ICD-10-CM

## 2021-03-19 NOTE — Progress Notes (Signed)
Patient: Anita Gibbs  Service Category: E/M  Provider: Gillis Santa, MD  DOB: 1971/05/27  DOS: 03/19/2021  Location: Office  MRN: [id number]  Setting: Ambulatory outpatient  Referring Provider: McLean-Scocuzza, Olivia Mackie *  Type: Established Patient  Specialty: Interventional Pain Management  PCP: McLean-Scocuzza, Nino Glow, MD  Location: Home  Delivery: TeleHealth     Virtual Encounter - Pain Management PROVIDER NOTE: Information contained herein reflects review and annotations entered in association with encounter. Interpretation of such information and data should be left to medically-trained personnel. Information provided to patient can be located elsewhere in the medical record under "Patient Instructions". Document created using STT-dictation technology, any transcriptional errors that may result from process are unintentional.    Contact & Pharmacy Preferred: [phone] Home: [phone] (home) Mobile: [phone] (mobile) E-mail: [email or web address]  [place], [place] [address] [place] [address] Phone: [phone] Fax: [phone]   Pre-screening  Anita Gibbs offered "in-person" vs "virtual" encounter. She indicated preferring virtual for this encounter.   Reason COVID-19*  Social distancing based on CDC and AMA recommendations.   I contacted Anita Gibbs on 03/19/2021 via video conference.      I clearly identified myself as Gillis Santa, MD. I verified that I was speaking with the correct person using two identifiers (Name: Anita Gibbs, and date of birth: 26-May-1971).  Consent I sought verbal advanced consent from Anita Gibbs for virtual visit interactions. I informed Anita Gibbs of possible security and privacy concerns, risks, and limitations associated with providing "not-in-person" medical evaluation and management services. I also informed Anita Gibbs of the availability of "in-person" appointments. Finally, I  informed her that there would be a charge for the virtual visit and that she could be  personally, fully or partially, financially responsible for it. Anita Gibbs expressed understanding and agreed to proceed.   Historic Elements   Anita Gibbs is a 50 y.o. year old, female patient evaluated today after our last contact on 01/01/2021. Anita Gibbs  has a past medical history of Arthritis, Carpal boss, right, Carpal tunnel syndrome, bilateral, Headache, Hives (09/05/2020), Hypertension, and Seasonal asthma (11/29/2018). She also  has a past surgical history that includes Knee arthroscopy (Right); Anterior cervical decomp/discectomy fusion (N/A, 07/01/2017); and Abdominal hysterectomy (10/17/2020). Anita Gibbs has a current medication list which includes the following prescription(s): albuterol, amlodipine, atorvastatin, cetirizine hcl, vitamin d-3, clobetasol cream, diphenhydramine, duloxetine, lumbar back brace/support pad, epinephrine, fluticasone, furosemide, hydrochlorothiazide, hydrocortisone, losartan, meloxicam, methocarbamol, montelukast, olopatadine hcl, pantoprazole, potassium chloride sa, and pregabalin. She  reports that she has never smoked. She has never used smokeless tobacco. She reports current alcohol use. She reports that she does not use drugs. Anita Gibbs is allergic to other, pineapple, shellfish allergy, strawberry (diagnostic), and tomato.   HPI  Today, she is being contacted for worsening of previously known (established) problem  Increased low back pain with radiation into right greater than left leg.  Related to lumbar radiculopathy.  Previous lumbar epidural steroid injection was on the right side at L4-L5 in August 2021.  It has almost been 1 year since her last lumbar epidural steroid injection.  Discussed repeating it.  Risks and benefits reviewed and patient would like to proceed.  Laboratory Chemistry Profile   Renal Lab Results  Component Value Date   BUN 18  02/18/2021   CREATININE 0.93 28/76/8115   BCR NOT APPLICABLE 72/62/0355   GFR 73.14 02/18/2021   GFRAA 76 09/07/2018  GFRNONAA 66 09/07/2018    Hepatic Lab Results  Component Value Date   AST 31 02/13/2021   ALT 47 (H) 02/13/2021   ALBUMIN 4.2 01/24/2021   ALKPHOS 61 01/24/2021    Electrolytes Lab Results  Component Value Date   NA 138 02/18/2021   K 3.9 02/18/2021   CL 104 02/18/2021   CALCIUM 9.3 02/18/2021   MG 2.2 09/07/2018    Bone Lab Results  Component Value Date   VD25OH 54.97 01/24/2021   25OHVITD1 8.1 (L) 09/07/2018   25OHVITD2 <1.0 09/07/2018   25OHVITD3 8.1 09/07/2018    Inflammation (CRP: Acute Phase) (ESR: Chronic Phase) Lab Results  Component Value Date   CRP <1.0 01/24/2021   ESRSEDRATE 37 (H) 01/24/2021         Note: Above Lab results reviewed.  Imaging  US Abdomen Complete CLINICAL DATA:  Elevated liver function tests  EXAM: ABDOMEN ULTRASOUND COMPLETE  COMPARISON:  06/12/2020  FINDINGS: Gallbladder: No gallstones or wall thickening visualized. No sonographic Murphy sign noted by sonographer.  Common bile duct: Diameter: 2 mm  Liver: No focal lesion identified. Within normal limits in parenchymal echogenicity. Portal vein is patent on color Doppler imaging with normal direction of blood flow towards the liver.  IVC: No abnormality visualized.  Pancreas: Visualized portion unremarkable.  Spleen: Size and appearance within normal limits.  Right Kidney: Length: 12.0 cm. Echogenicity within normal limits. No mass or hydronephrosis visualized.  Left Kidney: Length: 11.4 cm. Echogenicity within normal limits. 15 mm peripelvic cyst incidentally noted. No hydronephrosis or renal mass.  Abdominal aorta: No aneurysm visualized.  Other findings: None.  IMPRESSION: 1. Simple left renal cyst. 2. Otherwise unremarkable abdominal ultrasound.  Electronically Signed   By: Randa Ngo M.D.   On: 02/13/2021 14:33  Assessment  The  primary encounter diagnosis was Lumbar radiculitis (L5 dermatome) (Right). Diagnoses of Neurogenic pain and Chronic pain syndrome were also pertinent to this visit.  Plan of Care    [name] has a current medication list which includes the following long-term medication(s): albuterol, amlodipine, atorvastatin, cetirizine hcl, diphenhydramine, duloxetine, fluticasone, furosemide, hydrochlorothiazide, losartan, methocarbamol, montelukast, pantoprazole, potassium chloride sa, and pregabalin.   Orders:  Orders Placed This Encounter  Procedures   Lumbar Epidural Injection    Standing Status:   Future    Standing Expiration Date:   04/19/2021    Scheduling Instructions:     Procedure: Interlaminar Lumbar Epidural Steroid injection (LESI)            Laterality: Midline     Sedation: Patient's choice.     Timeframe: ASAA    Order Specific Question:   Where will this procedure be performed?    Answer:   [facility]   Follow-up plan:   Return for R L4-L5 ESI #2, without sedation.    Recent Visits Date Type Provider Dept  01/01/21 Office Visit Gillis Santa, MD [facility]  Showing recent visits within past 90 days and meeting all other requirements Today's Visits Date Type Provider Dept  03/19/21 Telemedicine Gillis Santa, MD [facility]  Showing today's visits and meeting all other requirements Future Appointments Date Type Provider Dept  04/02/21 Appointment Gillis Santa, MD [facility]  Showing future appointments within next 90 days and meeting all other requirements I discussed the assessment and treatment plan with the patient. The patient was provided an opportunity to ask questions and all were answered. The patient agreed with the plan and demonstrated  an understanding of the instructions.  Patient advised to call back or seek an in-person evaluation if the symptoms or condition worsens.  Duration of encounter: 20  minutes.  Note by: Gillis Santa, MD Date: 03/19/2021; Time: 1:44 PM

## 2021-03-19 NOTE — Telephone Encounter (Signed)
Please advise 

## 2021-03-25 ENCOUNTER — Other Ambulatory Visit: Payer: Self-pay

## 2021-03-25 ENCOUNTER — Ambulatory Visit (HOSPITAL_BASED_OUTPATIENT_CLINIC_OR_DEPARTMENT_OTHER): Payer: BC Managed Care – PPO | Admitting: Student in an Organized Health Care Education/Training Program

## 2021-03-25 ENCOUNTER — Ambulatory Visit
Admission: RE | Admit: 2021-03-25 | Discharge: 2021-03-25 | Disposition: A | Discharge status: Home / Self Care | Payer: BC Managed Care – PPO | Source: Ambulatory Visit | Attending: Student in an Organized Health Care Education/Training Program | Admitting: Student in an Organized Health Care Education/Training Program

## 2021-03-25 VITALS — BP 121/64 | HR 60 | Temp 97.0°F | Resp 18 | Ht 63.0 in | Wt 251.0 lb

## 2021-03-25 DIAGNOSIS — M792 Neuralgia and neuritis, unspecified: Secondary | ICD-10-CM | POA: Insufficient documentation

## 2021-03-25 DIAGNOSIS — G894 Chronic pain syndrome: Secondary | ICD-10-CM | POA: Diagnosis present

## 2021-03-25 DIAGNOSIS — M5416 Radiculopathy, lumbar region: Secondary | ICD-10-CM | POA: Diagnosis present

## 2021-03-25 LAB — CATECHOLAMINES, FRACTIONATED, URINE, 24 HOUR
Creatinine, Urine mg/day-CATEUR: 1.32 g/(24.h) (ref 0.50–2.15)
Total Volume: 850 mL

## 2021-03-25 LAB — METANEPHRINES, URINE, 24 HOUR
METANEPHRINE: 109 mcg/24 h (ref 58–203)
METANEPHRINES, TOTAL: 480 mcg/24 h (ref 182–739)
NORMETANEPHRINE: 371 mcg/24 h (ref 88–649)
Total Volume: 850 mL

## 2021-03-25 LAB — CORTISOL, URINE, 24 HOUR
24 Hour urine volume (VMAHVA): 850 mL
CREATININE, URINE: 1.39 g/(24.h) (ref 0.50–2.15)
Cortisol (Ur), Free: 11.8 mcg/24 h (ref 4.0–50.0)

## 2021-03-25 LAB — EXTRA URINE SPECIMEN

## 2021-03-25 MED ORDER — DEXAMETHASONE SODIUM PHOSPHATE 10 MG/ML IJ SOLN
10.0000 mg | Freq: Once | INTRAMUSCULAR | Status: AC
  Administered 2021-03-25: 10 mg

## 2021-03-25 MED ORDER — ROPIVACAINE HCL 2 MG/ML IJ SOLN
INTRAMUSCULAR | Status: AC
  Filled 2021-03-25: qty 20

## 2021-03-25 MED ORDER — DEXAMETHASONE SODIUM PHOSPHATE 10 MG/ML IJ SOLN
INTRAMUSCULAR | Status: AC
  Filled 2021-03-25: qty 1

## 2021-03-25 MED ORDER — LIDOCAINE HCL 2 % IJ SOLN
INTRAMUSCULAR | Status: AC
  Filled 2021-03-25: qty 10

## 2021-03-25 MED ORDER — SODIUM CHLORIDE 0.9% FLUSH
2.0000 mL | Freq: Once | INTRAVENOUS | Status: AC
  Administered 2021-03-25: 2 mL

## 2021-03-25 MED ORDER — SODIUM CHLORIDE (PF) 0.9 % IJ SOLN
INTRAMUSCULAR | Status: AC
  Filled 2021-03-25: qty 10

## 2021-03-25 MED ORDER — IOHEXOL 180 MG/ML  SOLN
10.0000 mL | Freq: Once | INTRAMUSCULAR | Status: AC
  Administered 2021-03-25: 10 mL via EPIDURAL

## 2021-03-25 MED ORDER — ROPIVACAINE HCL 2 MG/ML IJ SOLN
2.0000 mL | Freq: Once | INTRAMUSCULAR | Status: AC
  Administered 2021-03-25: 2 mL via EPIDURAL

## 2021-03-25 MED ORDER — LIDOCAINE HCL 2 % IJ SOLN
20.0000 mL | Freq: Once | INTRAMUSCULAR | Status: AC
  Administered 2021-03-25: 200 mg

## 2021-03-25 NOTE — Progress Notes (Signed)
Safety precautions to be maintained throughout the outpatient stay will include: orient to surroundings, keep bed in low position, maintain call bell within reach at all times, provide assistance with transfer out of bed and ambulation.  

## 2021-03-25 NOTE — Progress Notes (Signed)
PROVIDER NOTE: Information contained herein reflects review and annotations entered in association with encounter. Interpretation of such information and data should be left to medically-trained personnel. Information provided to patient can be located elsewhere in the medical record under "Patient Instructions". Document created using STT-dictation technology, any transcriptional errors that may result from process are unintentional.    Patient: Anita Gibbs  Service Category: Procedure  Provider: Gillis Santa, MD  DOB: [birth year]  DOS: 03/25/2021  Location: [facility]  MRN: [id number]  Setting: Ambulatory - outpatient  Referring Provider: McLean-Scocuzza, Olivia Mackie *  Type: Established Patient  Specialty: Interventional Pain Management  PCP: McLean-Scocuzza, Nino Glow, MD   Primary Reason for Visit: Interventional Pain Management Treatment. CC: Back Pain  Procedure:          Anesthesia, Analgesia, Anxiolysis:  Type: Therapeutic Inter-Laminar Epidural Steroid Injection  #2  Region: Lumbar Level: L5-S1 Level. Laterality: Left-Sided         Type: Local Anesthesia  Local Anesthetic: Lidocaine 1-2%  Position: Prone with head of the table was raised to facilitate breathing.   Indications: 1. Lumbar radiculitis (L5 dermatome)    2. Neurogenic pain   3. Chronic pain syndrome     Pain Score: Pre-procedure: 8 /10 Post-procedure: 2 /10   Pre-op Assessment:  Anita Gibbs is a 50 y.o. (year old), female patient, seen today for interventional treatment. She  has a past surgical history that includes Knee arthroscopy (Right); Anterior cervical decomp/discectomy fusion (N/A, 07/01/2017); and Abdominal hysterectomy (10/17/2020). Anita Gibbs has a current medication list which includes the following prescription(s): albuterol, amlodipine, atorvastatin, cetirizine hcl, vitamin d-3, clobetasol cream, diphenhydramine, duloxetine, lumbar back brace/support pad, epinephrine, fluticasone,  furosemide, hydrochlorothiazide, hydrocortisone, losartan, meloxicam, methocarbamol, montelukast, olopatadine hcl, pantoprazole, potassium chloride sa, and pregabalin. Her primarily concern today is the Back Pain  Initial Vital Signs:  Pulse/HCG Rate: 60  Temp: (!) 97 F (36.1 C) Resp: 18 BP: (!) 114/53 SpO2: 100 %  BMI: Estimated body mass index is 44.46 kg/m as calculated from the following:   Height as of this encounter: '5\' 3"'$  (1.6 m).   Weight as of this encounter: 251 lb (113.9 kg).  Risk Assessment: Allergies: Reviewed. She is allergic to other, pineapple, shellfish allergy, strawberry (diagnostic), and tomato.  Allergy Precautions: None required Coagulopathies: Reviewed. None identified.  Blood-thinner therapy: None at this time Active Infection(s): Reviewed. None identified. Anita Gibbs is afebrile  Site Confirmation: Anita Gibbs was asked to confirm the procedure and laterality before marking the site Procedure checklist: Completed Consent: Before the procedure and under the influence of no sedative(s), amnesic(s), or anxiolytics, the patient was informed of the treatment options, risks and possible complications. To fulfill our ethical and legal obligations, as recommended by the American Medical Association's Code of Ethics, I have informed the patient of my clinical impression; the nature and purpose of the treatment or procedure; the risks, benefits, and possible complications of the intervention; the alternatives, including doing nothing; the risk(s) and benefit(s) of the alternative treatment(s) or procedure(s); and the risk(s) and benefit(s) of doing nothing. The patient was provided information about the general risks and possible complications associated with the procedure. These may include, but are not limited to: failure to achieve desired goals, infection, bleeding, organ or nerve damage, allergic reactions, paralysis, and death. In addition, the patient was informed  of those risks and complications associated to Spine-related procedures, such as failure to decrease pain; infection (i.e.: Meningitis, epidural or intraspinal abscess); bleeding (i.e.: epidural hematoma,  subarachnoid hemorrhage, or any other type of intraspinal or peri-dural bleeding); organ or nerve damage (i.e.: Any type of peripheral nerve, nerve root, or spinal cord injury) with subsequent damage to sensory, motor, and/or autonomic systems, resulting in permanent pain, numbness, and/or weakness of one or several areas of the body; allergic reactions; (i.e.: anaphylactic reaction); and/or death. Furthermore, the patient was informed of those risks and complications associated with the medications. These include, but are not limited to: allergic reactions (i.e.: anaphylactic or anaphylactoid reaction(s)); adrenal axis suppression; blood sugar elevation that in diabetics may result in ketoacidosis or comma; water retention that in patients with history of congestive heart failure may result in shortness of breath, pulmonary edema, and decompensation with resultant heart failure; weight gain; swelling or edema; medication-induced neural toxicity; particulate matter embolism and blood vessel occlusion with resultant organ, and/or nervous system infarction; and/or aseptic necrosis of one or more joints. Finally, the patient was informed that Medicine is not an exact science; therefore, there is also the possibility of unforeseen or unpredictable risks and/or possible complications that may result in a catastrophic outcome. The patient indicated having understood very clearly. We have given the patient no guarantees and we have made no promises. Enough time was given to the patient to ask questions, all of which were answered to the patient's satisfaction. Anita Gibbs has indicated that she wanted to continue with the procedure. Attestation: I, the ordering provider, attest that I have discussed with the patient  the benefits, risks, side-effects, alternatives, likelihood of achieving goals, and potential problems during recovery for the procedure that I have provided informed consent. Date  Time: 03/25/2021 10:28 AM  Pre-Procedure Preparation:  Monitoring: As per clinic protocol. Respiration, ETCO2, SpO2, BP, heart rate and rhythm monitor placed and checked for adequate function Safety Precautions: Patient was assessed for positional comfort and pressure points before starting the procedure. Time-out: I initiated and conducted the "Time-out" before starting the procedure, as per protocol. The patient was asked to participate by confirming the accuracy of the "Time Out" information. Verification of the correct person, site, and procedure were performed and confirmed by me, the nursing staff, and the patient. "Time-out" conducted as per Joint Commission's Universal Protocol (UP.01.01.01). Time: 1053  Description of Procedure:          Target Area: The interlaminar space, initially targeting the lower laminar border of the superior vertebral body. Approach: Paramedial approach. Area Prepped: Entire Posterior Lumbar Region DuraPrep (Iodine Povacrylex [0.7% available iodine] and Isopropyl Alcohol, 74% w/w) Safety Precautions: Aspiration looking for blood return was conducted prior to all injections. At no point did we inject any substances, as a needle was being advanced. No attempts were made at seeking any paresthesias. Safe injection practices and needle disposal techniques used. Medications properly checked for expiration dates. SDV (single dose vial) medications used. Description of the Procedure: Protocol guidelines were followed. The procedure needle was introduced through the skin, ipsilateral to the reported pain, and advanced to the target area. Bone was contacted and the needle walked caudad, until the lamina was cleared. The epidural space was identified using "loss-of-resistance technique" with 2-3 ml  of PF-NaCl (0.9% NSS), in a 5cc LOR glass syringe.  Vitals:   03/25/21 1050 03/25/21 1055 03/25/21 1100 03/25/21 1107  BP: (!) 118/57 126/78 115/62 121/64  Pulse: (!) 59 (!) 57 (!) 59 60  Resp: 20 (!) 22 (!) 21 18  Temp:      TempSrc:      SpO2: 100% 96%  100% 100%  Weight:      Height:        Start Time: 1053 hrs. End Time: 1104 hrs.  Materials:  Needle(s) Type: Epidural needle Gauge: 17G Length: 5-in Medication(s): Please see orders for medications and dosing details. 6 cc solution made of 3 cc of preservative-free saline, 2 cc of 0.2% ropivacaine, 1 cc of Decadron 10 mg/cc.   (Unable to get in at Left L4/5 so entered at L5/S1) Imaging Guidance (Spinal):          Type of Imaging Technique: Fluoroscopy Guidance (Spinal) Indication(s): Assistance in needle guidance and placement for procedures requiring needle placement in or near specific anatomical locations not easily accessible without such assistance. Exposure Time: Please see nurses notes. Contrast: Before injecting any contrast, we confirmed that the patient did not have an allergy to iodine, shellfish, or radiological contrast. Once satisfactory needle placement was completed at the desired level, radiological contrast was injected. Contrast injected under live fluoroscopy. No contrast complications. See chart for type and volume of contrast used. Fluoroscopic Guidance: I was personally present during the use of fluoroscopy. "Tunnel Vision Technique" used to obtain the best possible view of the target area. Parallax error corrected before commencing the procedure. "Direction-depth-direction" technique used to introduce the needle under continuous pulsed fluoroscopy. Once target was reached, antero-posterior, oblique, and lateral fluoroscopic projection used confirm needle placement in all planes. Images permanently stored in EMR. Interpretation: I personally interpreted the imaging intraoperatively. Adequate needle placement  confirmed in multiple planes. Appropriate spread of contrast into desired area was observed. No evidence of afferent or efferent intravascular uptake. No intrathecal or subarachnoid spread observed. Permanent images saved into the patient's record.   Post-operative Assessment:  Post-procedure Vital Signs:  Pulse/HCG Rate: 60 (nsr)  Temp: (!) 97 F (36.1 C) Resp: 18 BP: 121/64 SpO2: 100 %  EBL: None  Complications: No immediate post-treatment complications observed by team, or reported by patient.  Note: The patient tolerated the entire procedure well. A repeat set of vitals were taken after the procedure and the patient was kept under observation following institutional policy, for this type of procedure. Post-procedural neurological assessment was performed, showing return to baseline, prior to discharge. The patient was provided with post-procedure discharge instructions, including a section on how to identify potential problems. Should any problems arise concerning this procedure, the patient was given instructions to immediately contact us, at any time, without hesitation. In any case, we plan to contact the patient by telephone for a follow-up status report regarding this interventional procedure.  Comments:  No additional relevant information.  5 out of 5 strength bilateral lower extremity: Plantar flexion, dorsiflexion, knee flexion, knee extension.   Plan of Care  Orders:  Orders Placed This Encounter  Procedures   DG PAIN [facility] C-ARM 1-60 MIN NO REPORT    Intraoperative interpretation by procedural physician at [facility].    Standing Status:   Standing    Number of Occurrences:   1    Order Specific Question:   Reason for exam:    Answer:   Assistance in needle guidance and placement for procedures requiring needle placement in or near specific anatomical locations not easily accessible without such assistance.    Medications ordered for procedure: Meds  ordered this encounter  Medications   iohexol (OMNIPAQUE) 180 MG/ML injection 10 mL    Must be Myelogram-compatible. If not available, you may substitute with a water-soluble, non-ionic, hypoallergenic, myelogram-compatible radiological contrast medium.   lidocaine (  XYLOCAINE) 2 % (with pres) injection 400 mg   ropivacaine (PF) 2 mg/mL (0.2%) (NAROPIN) injection 2 mL   sodium chloride flush (NS) 0.9 % injection 2 mL   dexamethasone (DECADRON) injection 10 mg    Medications administered: We administered iohexol, lidocaine, ropivacaine (PF) 2 mg/mL (0.2%), sodium chloride flush, and dexamethasone.  See the medical record for exact dosing, route, and time of administration.  Follow-up plan:   Return in about 4 weeks (around 04/22/2021) for Post Procedure Evaluation, virtual.    Recent Visits Date Type Provider Dept  03/19/21 Telemedicine Gillis Santa, MD [facility]  01/01/21 Office Visit Gillis Santa, MD [facility]  Showing recent visits within past 90 days and meeting all other requirements Today's Visits Date Type Provider Dept  03/25/21 Procedure visit Gillis Santa, MD [facility]  Showing today's visits and meeting all other requirements Future Appointments Date Type Provider Dept  04/02/21 Appointment Gillis Santa, MD [facility]  Showing future appointments within next 90 days and meeting all other requirements Disposition: Discharge home  Discharge (Date  Time): 03/25/2021; 1110 hrs.   Primary Care Physician: McLean-Scocuzza, Nino Glow, MD Location: [facility] Note by: Gillis Santa, MD Date: 03/25/2021; Time: 1:23 PM  Disclaimer:  Medicine is not an Chief Strategy Officer. The only guarantee in medicine is that nothing is guaranteed. It is important to note that the decision to proceed with this intervention was based on the information collected from the patient. The Data and conclusions were drawn from the  patient's questionnaire, the interview, and the physical examination. Because the information was provided in large part by the patient, it cannot be guaranteed that it has not been purposely or unconsciously manipulated. Every effort has been made to obtain as much relevant data as possible for this evaluation. It is important to note that the conclusions that lead to this procedure are derived in large part from the available data. Always take into account that the treatment will also be dependent on availability of resources and existing treatment guidelines, considered by other Pain Management Practitioners as being common knowledge and practice, at the time of the intervention. For Medico-Legal purposes, it is also important to point out that variation in procedural techniques and pharmacological choices are the acceptable norm. The indications, contraindications, technique, and results of the above procedure should only be interpreted and judged by a Board-Certified Interventional Pain Specialist with extensive familiarity and expertise in the same exact procedure and technique.

## 2021-03-26 ENCOUNTER — Telehealth: Payer: Self-pay

## 2021-03-26 ENCOUNTER — Other Ambulatory Visit: Payer: Self-pay

## 2021-03-26 ENCOUNTER — Other Ambulatory Visit: Payer: Self-pay | Admitting: Student in an Organized Health Care Education/Training Program

## 2021-03-26 NOTE — Telephone Encounter (Signed)
Post procedure phone call. Patient states she is doing good.  

## 2021-04-02 ENCOUNTER — Ambulatory Visit
Payer: BC Managed Care – PPO | Attending: Student in an Organized Health Care Education/Training Program | Admitting: Student in an Organized Health Care Education/Training Program

## 2021-04-02 ENCOUNTER — Other Ambulatory Visit: Payer: Self-pay

## 2021-04-02 ENCOUNTER — Encounter: Payer: Self-pay | Admitting: Student in an Organized Health Care Education/Training Program

## 2021-04-02 VITALS — BP 117/57 | HR 65 | Temp 96.8°F | Resp 16 | Ht 63.0 in | Wt 242.0 lb

## 2021-04-02 DIAGNOSIS — G894 Chronic pain syndrome: Secondary | ICD-10-CM | POA: Insufficient documentation

## 2021-04-02 DIAGNOSIS — M5416 Radiculopathy, lumbar region: Secondary | ICD-10-CM | POA: Diagnosis present

## 2021-04-02 DIAGNOSIS — M5412 Radiculopathy, cervical region: Secondary | ICD-10-CM | POA: Diagnosis present

## 2021-04-02 DIAGNOSIS — G8929 Other chronic pain: Secondary | ICD-10-CM | POA: Insufficient documentation

## 2021-04-02 DIAGNOSIS — M7918 Myalgia, other site: Secondary | ICD-10-CM | POA: Insufficient documentation

## 2021-04-02 DIAGNOSIS — M542 Cervicalgia: Secondary | ICD-10-CM | POA: Diagnosis present

## 2021-04-02 DIAGNOSIS — M47812 Spondylosis without myelopathy or radiculopathy, cervical region: Secondary | ICD-10-CM | POA: Insufficient documentation

## 2021-04-02 DIAGNOSIS — M792 Neuralgia and neuritis, unspecified: Secondary | ICD-10-CM | POA: Insufficient documentation

## 2021-04-02 DIAGNOSIS — M47816 Spondylosis without myelopathy or radiculopathy, lumbar region: Secondary | ICD-10-CM | POA: Diagnosis present

## 2021-04-02 MED ORDER — METHOCARBAMOL 750 MG PO TABS: 750.0000 mg | ORAL_TABLET | Freq: Three times a day (TID) | ORAL | 5 refills | Status: DC | PRN

## 2021-04-02 MED ORDER — PREGABALIN 75 MG PO CAPS: 75.0000 mg | ORAL_CAPSULE | Freq: Three times a day (TID) | ORAL | 2 refills | Status: DC

## 2021-04-02 MED ORDER — BUPRENORPHINE 5 MCG/HR TD PTWK: 1.0000 | MEDICATED_PATCH | TRANSDERMAL | 0 refills | Status: DC

## 2021-04-02 NOTE — Progress Notes (Signed)
Safety precautions to be maintained throughout the outpatient stay will include: orient to surroundings, keep bed in low position, maintain call bell within reach at all times, provide assistance with transfer out of bed and ambulation.  

## 2021-04-02 NOTE — Progress Notes (Signed)
PROVIDER NOTE: Information contained herein reflects review and annotations entered in association with encounter. Interpretation of such information and data should be left to medically-trained personnel. Information provided to patient can be located elsewhere in the medical record under "Patient Instructions". Document created using STT-dictation technology, any transcriptional errors that may result from process are unintentional.    Patient: Anita Gibbs  Service Category: E/M  Provider: Gillis Santa, MD  DOB: 04-17-1971  DOS: 04/02/2021  Specialty: Interventional Pain Management  MRN: [id number]  Setting: Ambulatory outpatient  PCP: McLean-Scocuzza, Nino Glow, MD  Type: Established Patient    Referring Provider: Orland Mustard *  Location: Office  Delivery: Face-to-face     HPI  [name], a 50 y.o. year old female, is here today because of her Right lumbar radiculitis [M54.16]. Ms. Lanting primary complain today is Back Pain (right) Last encounter: [name] was on 03/26/2021. Pertinent problems: Ms. Schum has Lumbar facet syndrome (Bilateral) (R>L); Spondylosis without myelopathy or radiculopathy, lumbosacral region; and Morbid obesity with BMI of 45.0-49.9, adult ([place]) on their pertinent problem list. Pain Assessment: Severity of Chronic pain is reported as a 10-Worst pain ever/10. Location: Back Lower/denies. Onset: More than a month ago. Quality: Constant, Burning. Timing: Constant. Modifying factor(s): heat, ice. Vitals:  height is 5' 3" (1.6 m) and weight is 242 lb (109.8 kg). Her temporal temperature is 96.8 F (36 C) (abnormal). Her blood pressure is 117/57 (abnormal) and her pulse is 65. Her respiration is 16 and oxygen saturation is 100%.   Reason for encounter: both, medication management and post-procedure assessment.     Post-Procedure Evaluation  Procedure (03/25/2021):  Type: Therapeutic Inter-Laminar Epidural Steroid Injection  #2   Region: Lumbar Level: L5-S1 Level. Laterality: Left-Sided       Anxiolysis: Please see nurses note.  Effectiveness during initial hour after procedure (Ultra-Short Term Relief): 80 %   Local anesthetic used: Long-acting (4-6 hours) Effectiveness: Defined as any analgesic benefit obtained secondary to the administration of local anesthetics. This carries significant diagnostic value as to the etiological location, or anatomical origin, of the pain. Duration of benefit is expected to coincide with the duration of the local anesthetic used.  Effectiveness during initial 4-6 hours after procedure (Short-Term Relief): 80 %   Long-term benefit: Defined as any relief past the pharmacologic duration of the local anesthetics.  Effectiveness past the initial 6 hours after procedure (Long-Term Relief): 50% for first week  Benefits, current: Defined as benefit present at the time of this evaluation.   Analgesia:  <50% better    Pharmacotherapy Assessment  Analgesic: Start Butrans patch at 5 mcg an hour  Monitoring: [place] PMP: PDMP reviewed during this encounter.       Pharmacotherapy: No side-effects or adverse reactions reported. Compliance: No problems identified. Effectiveness: Clinically acceptable.  Landis Martins, RN  04/02/2021 10:20 AM  Sign when Signing Visit Safety precautions to be maintained throughout the outpatient stay will include: orient to surroundings, keep bed in low position, maintain call bell within reach at all times, provide assistance with transfer out of bed and ambulation.     UDS:  Summary  Date Value Ref Range Status  02/01/2020 Note  Final    Comment:    ==================================================================== [employer] Select 13 (MW) ==================================================================== Test                             Result  Flag       Units Drug Present not Declared for Prescription Verification   Carboxy-THC                     20           UNEXPECTED ng/mg creat    Carboxy-THC is a metabolite of tetrahydrocannabinol (THC). Source of    THC is most commonly herbal marijuana or marijuana-based products,    but THC is also present in a scheduled prescription medication.    Trace amounts of THC can be present in hemp and cannabidiol (CBD)    products. This test is not intended to distinguish between delta-9-    tetrahydrocannabinol, the predominant form of THC in most herbal or    marijuana-based products, and delta-8-tetrahydrocannabinol. Drug Absent but Declared for Prescription Verification   Oxycodone                      Not Detected UNEXPECTED ng/mg creat ==================================================================== Test                      Result    Flag   Units      Ref Range   Creatinine              230              mg/dL      >=20 ==================================================================== Declared Medications:  The flagging and interpretation on this report are based on the  following declared medications.  Unexpected results may arise from  inaccuracies in the declared medications.  **Note: The testing scope of this panel includes these medications:  Oxycodone (Roxicodone)  **Note: The testing scope of this panel does not include the  following reported medications:  Albuterol (Ventolin HFA)  Amlodipine (Norvasc)  Atorvastatin (Lipitor)  Cetirizine  Diphenhydramine (Benadryl)  Epinephrine (EpiPen)  Fluticasone (Flonase)  Gabapentin (Neurontin)  Hydrochlorothiazide  Losartan (Cozaar)  Meloxicam (Mobic)  Methocarbamol (Robaxin)  Montelukast (Singulair)  Norethindrone (Aygestin)  Pantoprazole (Protonix)  Vitamin D3 ==================================================================== For clinical consultation, please call [phone]. ====================================================================      ROS  Constitutional: Denies any fever or  chills Gastrointestinal: No reported hemesis, hematochezia, vomiting, or acute GI distress Musculoskeletal:  Right low back pain  Neurological: No reported episodes of acute onset apraxia, aphasia, dysarthria, agnosia, amnesia, paralysis, loss of coordination, or loss of consciousness  Medication Review  Cetirizine HCl, DULoxetine, EPINEPHrine, Lumbar Back Brace/Support Pad, Olopatadine HCl, Vitamin D-3, albuterol, amLODipine, atorvastatin, buprenorphine, clobetasol cream, diphenhydrAMINE, fluticasone, furosemide, hydrochlorothiazide, hydrocortisone, losartan, meloxicam, methocarbamol, montelukast, pantoprazole, potassium chloride SA, and pregabalin  History Review  Allergy: Ms. Brindisi is allergic to other, pineapple, shellfish allergy, strawberry (diagnostic), and tomato. Drug: Ms. Lawrance  reports no history of drug use. Alcohol:  reports current alcohol use. Tobacco:  reports that she has never smoked. She has never used smokeless tobacco. Social: Ms. Mutz  reports that she has never smoked. She has never used smokeless tobacco. She reports current alcohol use. She reports that she does not use drugs. Medical:  has a past medical history of Arthritis, Carpal boss, right, Carpal tunnel syndrome, bilateral, Headache, Hives (09/05/2020), Hypertension, and Seasonal asthma (11/29/2018). Surgical: Ms. Raphael  has a past surgical history that includes Knee arthroscopy (Right); Anterior cervical decomp/discectomy fusion (N/A, 07/01/2017); and Abdominal hysterectomy (10/17/2020). Family: family history includes [name] in her mother; Arthritis in her father, mother, and sister; Breast cancer in her maternal aunt and  paternal aunt; COPD in her brother and son; Cancer in her brother; Depression in her son; Diabetes in her mother; Early death in her brother; Hearing loss in her father; Hyperlipidemia in her mother; Hypertension in her father, mother, and sister; Kidney disease in her father; Lymphoma in  her father; Sleep apnea in her daughter.  Laboratory Chemistry Profile   Renal Lab Results  Component Value Date   BUN 18 02/18/2021   CREATININE 0.93 12/45/8099   BCR NOT APPLICABLE 83/38/2505   GFR 73.14 02/18/2021   GFRAA 76 09/07/2018   GFRNONAA 66 09/07/2018    Hepatic Lab Results  Component Value Date   AST 31 02/13/2021   ALT 47 (H) 02/13/2021   ALBUMIN 4.2 01/24/2021   ALKPHOS 61 01/24/2021    Electrolytes Lab Results  Component Value Date   NA 138 02/18/2021   K 3.9 02/18/2021   CL 104 02/18/2021   CALCIUM 9.3 02/18/2021   MG 2.2 09/07/2018    Bone Lab Results  Component Value Date   VD25OH 54.97 01/24/2021   25OHVITD1 8.1 (L) 09/07/2018   25OHVITD2 <1.0 09/07/2018   25OHVITD3 8.1 09/07/2018    Inflammation (CRP: Acute Phase) (ESR: Chronic Phase) Lab Results  Component Value Date   CRP <1.0 01/24/2021   ESRSEDRATE 37 (H) 01/24/2021         Note: Above Lab results reviewed.  Physical Exam  General appearance: Well nourished, well developed, and well hydrated. In no apparent acute distress Mental status: Alert, oriented x 3 (person, place, & time)       Respiratory: No evidence of acute respiratory distress Eyes: PERLA Vitals: BP (!) 117/57   Pulse 65   Temp (!) 96.8 F (36 C) (Temporal)   Resp 16   Ht 5' 3" (1.6 m)   Wt 242 lb (109.8 kg)   LMP  (LMP Unknown)   SpO2 100%   BMI 42.87 kg/m  BMI: Estimated body mass index is 42.87 kg/m as calculated from the following:   Height as of this encounter: 5' 3" (1.6 m).   Weight as of this encounter: 242 lb (109.8 kg). Ideal: Ideal body weight: 52.4 kg (115 lb 8.3 oz) Adjusted ideal body weight: 75.3 kg (166 lb 1.8 oz)  Lumbar Spine Area Exam  Skin & Axial Inspection: No masses, redness, or swelling Alignment: Symmetrical Functional ROM: Unrestricted ROM       Stability: No instability detected Muscle Tone/Strength: Functionally intact. No obvious neuro-muscular anomalies detected. Sensory  (Neurological): Musculoskeletal pain pattern   Lower Extremity Exam      Side: Right lower extremity   Side: Left lower extremity  Stability: No instability observed           Stability: No instability observed          Skin & Extremity Inspection: Skin color, temperature, and hair growth are WNL. No peripheral edema or cyanosis. No masses, redness, swelling, asymmetry, or associated skin lesions. No contractures.   Skin & Extremity Inspection: Skin color, temperature, and hair growth are WNL. No peripheral edema or cyanosis. No masses, redness, swelling, asymmetry, or associated skin lesions. No contractures.  Functional ROM: Unrestricted ROM                   Functional ROM: Unrestricted ROM                  Muscle Tone/Strength: Functionally intact. No obvious neuro-muscular anomalies detected.   Muscle Tone/Strength: Functionally intact. No obvious neuro-muscular  anomalies detected.  Sensory (Neurological): Unimpaired         Sensory (Neurological): Unimpaired        DTR: Patellar: deferred today Achilles: deferred today Plantar: deferred today   DTR: Patellar: deferred today Achilles: deferred today Plantar: deferred today  Palpation: No palpable anomalies   Palpation: No palpable anomalies    Assessment   Status Diagnosis  Having a Flare-up Having a Flare-up Having a Flare-up 1. Lumbar radiculitis (L5 dermatome)    2. Cervical radicular pain   3. Lumbar facet arthropathy   4. Cervicalgia (Primary Area of Pain) (Bilateral) (L>R)   5. Spondylosis without myelopathy or radiculopathy, cervical region   6. Neurogenic pain   7. Chronic pain syndrome   8. Chronic musculoskeletal pain       Plan of Care    [name] has a current medication list which includes the following long-term medication(s): albuterol, amlodipine, atorvastatin, cetirizine hcl, diphenhydramine, duloxetine, fluticasone, furosemide, hydrochlorothiazide, losartan, montelukast, pantoprazole, potassium  chloride sa, methocarbamol, and pregabalin.  Pharmacotherapy (Medications Ordered): Meds ordered this encounter  Medications   buprenorphine (BUTRANS) 5 MCG/HR PTWK    Sig: Place 1 patch onto the skin once a week for 28 days.    Dispense:  4 patch    Refill:  0    Chronic Pain: STOP Act (Not applicable) Fill 1 day early if closed on refill date. Avoid benzodiazepines within 8 hours of opioids   methocarbamol (ROBAXIN) 750 MG tablet    Sig: Take 1 tablet (750 mg total) by mouth every 8 (eight) hours as needed for muscle spasms.    Dispense:  90 tablet    Refill:  5    Fill one day early if pharmacy is closed on scheduled refill date. May substitute for generic if available.   pregabalin (LYRICA) 75 MG capsule    Sig: Take 1 capsule (75 mg total) by mouth 3 (three) times daily.    Dispense:  90 capsule    Refill:  2    Fill one day early if pharmacy is closed on scheduled refill date. May substitute for generic if available.    Follow-up plan:   Return in about 5 weeks (around 05/07/2021) for Medication Management, in person.    Recent Visits Date Type Provider Dept  03/25/21 Procedure visit Gillis Santa, MD [facility]  03/19/21 Telemedicine Gillis Santa, MD [facility]  Showing recent visits within past 90 days and meeting all other requirements Today's Visits Date Type Provider Dept  04/02/21 Office Visit Gillis Santa, MD [facility]  Showing today's visits and meeting all other requirements Future Appointments Date Type Provider Dept  05/02/21 Appointment Gillis Santa, MD [facility]  Showing future appointments within next 90 days and meeting all other requirements I discussed the assessment and treatment plan with the patient. The patient was provided an opportunity to ask questions and all were answered. The patient agreed with the plan and demonstrated an understanding of the instructions.  Patient advised to call back or seek an  in-person evaluation if the symptoms or condition worsens.  Duration of encounter: 42mnutes.  Note by: BGillis Santa MD Date: 04/02/2021; Time: 10:35 AM

## 2021-04-25 ENCOUNTER — Other Ambulatory Visit: Payer: Self-pay

## 2021-04-25 ENCOUNTER — Ambulatory Visit
Payer: BC Managed Care – PPO | Attending: Student in an Organized Health Care Education/Training Program | Admitting: Student in an Organized Health Care Education/Training Program

## 2021-04-25 ENCOUNTER — Encounter: Payer: Self-pay | Admitting: Student in an Organized Health Care Education/Training Program

## 2021-04-25 VITALS — BP 122/67 | HR 70 | Temp 97.1°F | Resp 16 | Ht 63.0 in | Wt 250.0 lb

## 2021-04-25 DIAGNOSIS — M5416 Radiculopathy, lumbar region: Secondary | ICD-10-CM | POA: Diagnosis present

## 2021-04-25 DIAGNOSIS — M792 Neuralgia and neuritis, unspecified: Secondary | ICD-10-CM | POA: Diagnosis present

## 2021-04-25 DIAGNOSIS — G894 Chronic pain syndrome: Secondary | ICD-10-CM | POA: Diagnosis present

## 2021-04-25 DIAGNOSIS — M542 Cervicalgia: Secondary | ICD-10-CM | POA: Diagnosis present

## 2021-04-25 DIAGNOSIS — M5412 Radiculopathy, cervical region: Secondary | ICD-10-CM | POA: Diagnosis present

## 2021-04-25 DIAGNOSIS — M47812 Spondylosis without myelopathy or radiculopathy, cervical region: Secondary | ICD-10-CM

## 2021-04-25 DIAGNOSIS — M47816 Spondylosis without myelopathy or radiculopathy, lumbar region: Secondary | ICD-10-CM | POA: Diagnosis present

## 2021-04-25 MED ORDER — BUPRENORPHINE 7.5 MCG/HR TD PTWK: 1.0000 | MEDICATED_PATCH | TRANSDERMAL | 1 refills | Status: DC

## 2021-04-25 NOTE — Progress Notes (Signed)
Nursing Pain Medication Assessment:  Safety precautions to be maintained throughout the outpatient stay will include: orient to surroundings, keep bed in low position, maintain call bell within reach at all times, provide assistance with transfer out of bed and ambulation.  Medication Inspection Compliance: Pill count conducted under aseptic conditions, in front of the patient. Neither the pills nor the bottle was removed from the patient's sight at any time. Once count was completed pills were immediately returned to the patient in their original bottle.  Medication: Buprenorphine (Suboxone) Pill/Patch Count:  1 of 4 pills remain Pill/Patch Appearance: Markings consistent with prescribed medication Bottle Appearance: Standard pharmacy container. Clearly labeled. Filled Date: 8 / 3 / 2022 Last Medication intake:  Today

## 2021-04-25 NOTE — Progress Notes (Signed)
PROVIDER NOTE: Information contained herein reflects review and annotations entered in association with encounter. Interpretation of such information and data should be left to medically-trained personnel. Information provided to patient can be located elsewhere in the medical record under "Patient Instructions". Document created using STT-dictation technology, any transcriptional errors that may result from process are unintentional.    Patient: Anita Gibbs  Service Category: E/M  Provider: Gillis Santa, MD  DOB: 04-23-1971  DOS: 04/25/2021  Specialty: Interventional Pain Management  MRN: [id number]  Setting: Ambulatory outpatient  PCP: McLean-Scocuzza, Anita Glow, MD  Type: Established Patient    Referring Provider: Orland Gibbs *  Location: Office  Delivery: Face-to-face     HPI  [name], a 50 y.o. year old female, is here today because of her Right lumbar radiculitis [M54.16]. [name] primary complain today is Back Pain (lower) Last encounter: [name] was on 04/02/2021. Pertinent problems: [name] has Lumbar facet syndrome (Bilateral) (R>L); Spondylosis without myelopathy or radiculopathy, lumbosacral region; and Morbid obesity with BMI of 45.0-49.9, adult ([place]) on their pertinent problem list. Pain Assessment: Severity of   is reported as a 4 /10. Location: Back Lower, Left/denies. Onset: More than a month ago. Quality: Constant, Throbbing. Timing: Constant. Modifying factor(s): heat,ice, patches, heating pad. Vitals:  height is _0  (1.6 m) and weight is 250 lb (113.4 kg). Her temperature is 97.1 F (36.2 C) (abnormal). Her blood pressure is 122/67 and her pulse is 70. Her respiration is 16 and oxygen saturation is 100%.   Reason for encounter: medication management.    Patient presents today for medication management.  She is endorsing analgesic and functional benefit with Butrans patch that was started at her last clinic visit on 5 mcg an  hour.  She states that she has an area of her back that is still painful.  She is not experiencing any side effects with buprenorphine including nausea, sedation, constipation.  We discussed dose escalation from 5 to 7.5 mcg to see if that improves her analgesic response.  Risks and benefits reviewed.   Pharmacotherapy Assessment  Analgesic: Increase Butrans to 7.5 mcg an hour  Monitoring: [place] PMP: PDMP reviewed during this encounter.       Pharmacotherapy: No side-effects or adverse reactions reported. Compliance: No problems identified. Effectiveness: Clinically acceptable.  Anita Specking, RN  04/25/2021  1:01 PM  Sign when Signing Visit Nursing Pain Medication Assessment:  Safety precautions to be maintained throughout the outpatient stay will include: orient to surroundings, keep bed in low position, maintain call bell within reach at all times, provide assistance with transfer out of bed and ambulation.  Medication Inspection Compliance: Pill count conducted under aseptic conditions, in front of the patient. Neither the pills nor the bottle was removed from the patient's sight at any time. Once count was completed pills were immediately returned to the patient in their original bottle.  Medication: Buprenorphine (Suboxone) Pill/Patch Count:  1 of 4 pills remain Pill/Patch Appearance: Markings consistent with prescribed medication Bottle Appearance: Standard pharmacy container. Clearly labeled. Filled Date: 8 / 3 / 2022 Last Medication intake:  Today  UDS:  Summary  Date Value Ref Range Status  02/01/2020 Note  Final    Comment:    ==================================================================== [employer] Select 13 (MW) ==================================================================== Test                             Result  Flag       Units Drug Present not Declared for Prescription Verification   Carboxy-THC                    20           UNEXPECTED ng/mg creat     Carboxy-THC is a metabolite of tetrahydrocannabinol (THC). Source of    THC is most commonly herbal marijuana or marijuana-based products,    but THC is also present in a scheduled prescription medication.    Trace amounts of THC can be present in hemp and cannabidiol (CBD)    products. This test is not intended to distinguish between delta-9-    tetrahydrocannabinol, the predominant form of THC in most herbal or    marijuana-based products, and delta-8-tetrahydrocannabinol. Drug Absent but Declared for Prescription Verification   Oxycodone                      Not Detected UNEXPECTED ng/mg creat ==================================================================== Test                      Result    Flag   Units      Ref Range   Creatinine              230              mg/dL      >=20 ==================================================================== Declared Medications:  The flagging and interpretation on this report are based on the  following declared medications.  Unexpected results may arise from  inaccuracies in the declared medications.  **Note: The testing scope of this panel includes these medications:  Oxycodone (Roxicodone)  **Note: The testing scope of this panel does not include the  following reported medications:  Albuterol (Ventolin HFA)  Amlodipine (Norvasc)  Atorvastatin (Lipitor)  Cetirizine  Diphenhydramine (Benadryl)  Epinephrine (EpiPen)  Fluticasone (Flonase)  Gabapentin (Neurontin)  Hydrochlorothiazide  Losartan (Cozaar)  Meloxicam (Mobic)  Methocarbamol (Robaxin)  Montelukast (Singulair)  Norethindrone (Aygestin)  Pantoprazole (Protonix)  Vitamin D3 ==================================================================== For clinical consultation, please call [phone]. ====================================================================      ROS  Constitutional: Denies any fever or chills Gastrointestinal: No reported hemesis, hematochezia,  vomiting, or acute GI distress Musculoskeletal:  Right low back pain  Neurological: No reported episodes of acute onset apraxia, aphasia, dysarthria, agnosia, amnesia, paralysis, loss of coordination, or loss of consciousness  Medication Review  Cetirizine HCl, DULoxetine, EPINEPHrine, Lumbar Back Brace/Support Pad, Olopatadine HCl, Vitamin D-3, albuterol, amLODipine, atorvastatin, buprenorphine, clobetasol cream, diphenhydrAMINE, fluticasone, furosemide, hydrochlorothiazide, hydrocortisone, losartan, meloxicam, methocarbamol, montelukast, pantoprazole, potassium chloride SA, and pregabalin  History Review  Allergy: Ms. Fiero is allergic to other, pineapple, shellfish allergy, strawberry (diagnostic), and tomato. Drug: Ms. Blomberg  reports no history of drug use. Alcohol:  reports current alcohol use. Tobacco:  reports that she has never smoked. She has never used smokeless tobacco. Social: Ms. Almendariz  reports that she has never smoked. She has never used smokeless tobacco. She reports current alcohol use. She reports that she does not use drugs. Medical:  has a past medical history of Arthritis, Carpal boss, right, Carpal tunnel syndrome, bilateral, Headache, Hives (09/05/2020), Hypertension, and Seasonal asthma (11/29/2018). Surgical: Ms. Birkland  has a past surgical history that includes Knee arthroscopy (Right); Anterior cervical decomp/discectomy fusion (N/A, 07/01/2017); and Abdominal hysterectomy (10/17/2020). Family: family history includes [name] in her mother; Arthritis in her father, mother, and sister; Breast cancer in her maternal aunt and  paternal aunt; COPD in her brother and son; Cancer in her brother; Depression in her son; Diabetes in her mother; Early death in her brother; Hearing loss in her father; Hyperlipidemia in her mother; Hypertension in her father, mother, and sister; Kidney disease in her father; Lymphoma in her father; Sleep apnea in her daughter.  Laboratory  Chemistry Profile   Renal Lab Results  Component Value Date   BUN 18 02/18/2021   CREATININE 0.93 85/46/2703   BCR NOT APPLICABLE 50/05/3817   GFR 73.14 02/18/2021   GFRAA 76 09/07/2018   GFRNONAA 66 09/07/2018    Hepatic Lab Results  Component Value Date   AST 31 02/13/2021   ALT 47 (H) 02/13/2021   ALBUMIN 4.2 01/24/2021   ALKPHOS 61 01/24/2021    Electrolytes Lab Results  Component Value Date   NA 138 02/18/2021   K 3.9 02/18/2021   CL 104 02/18/2021   CALCIUM 9.3 02/18/2021   MG 2.2 09/07/2018    Bone Lab Results  Component Value Date   VD25OH 54.97 01/24/2021   25OHVITD1 8.1 (L) 09/07/2018   25OHVITD2 <1.0 09/07/2018   25OHVITD3 8.1 09/07/2018    Inflammation (CRP: Acute Phase) (ESR: Chronic Phase) Lab Results  Component Value Date   CRP <1.0 01/24/2021   ESRSEDRATE 37 (H) 01/24/2021         Note: Above Lab results reviewed.  Physical Exam  General appearance: Well nourished, well developed, and well hydrated. In no apparent acute distress Mental status: Alert, oriented x 3 (person, place, & time)       Respiratory: No evidence of acute respiratory distress Eyes: PERLA Vitals: BP 122/67   Pulse 70   Temp (!) 97.1 F (36.2 C)   Resp 16   Ht _0  (1.6 m)   Wt 250 lb (113.4 kg)   LMP  (LMP Unknown)   SpO2 100%   BMI 44.29 kg/m  BMI: Estimated body mass index is 44.29 kg/m as calculated from the following:   Height as of this encounter: _1  (1.6 m).   Weight as of this encounter: 250 lb (113.4 kg). Ideal: Ideal body weight: 52.4 kg (115 lb 8.3 oz) Adjusted ideal body weight: 76.8 kg (169 lb 5 oz)  Lumbar Spine Area Exam  Skin & Axial Inspection: No masses, redness, or swelling Alignment: Symmetrical Functional ROM: Unrestricted ROM       Stability: No instability detected Muscle Tone/Strength: Functionally intact. No obvious neuro-muscular anomalies detected. Sensory (Neurological): Musculoskeletal pain pattern   Lower Extremity Exam       Side: Right lower extremity   Side: Left lower extremity  Stability: No instability observed           Stability: No instability observed          Skin & Extremity Inspection: Skin color, temperature, and hair growth are WNL. No peripheral edema or cyanosis. No masses, redness, swelling, asymmetry, or associated skin lesions. No contractures.   Skin & Extremity Inspection: Skin color, temperature, and hair growth are WNL. No peripheral edema or cyanosis. No masses, redness, swelling, asymmetry, or associated skin lesions. No contractures.  Functional ROM: Unrestricted ROM                   Functional ROM: Unrestricted ROM                  Muscle Tone/Strength: Functionally intact. No obvious neuro-muscular anomalies detected.   Muscle Tone/Strength: Functionally intact. No obvious neuro-muscular anomalies detected.  Sensory (Neurological): Unimpaired         Sensory (Neurological): Unimpaired        DTR: Patellar: deferred today Achilles: deferred today Plantar: deferred today   DTR: Patellar: deferred today Achilles: deferred today Plantar: deferred today  Palpation: No palpable anomalies   Palpation: No palpable anomalies    Assessment   Status Diagnosis  Improving Persistent Controlled 1. Lumbar radiculitis (L5 dermatome)    2. Lumbar facet arthropathy   3. Cervicalgia (Primary Area of Pain) (Bilateral) (L>R)   4. Spondylosis without myelopathy or radiculopathy, cervical region   5. Neurogenic pain   6. Cervical radicular pain   7. Chronic pain syndrome       Plan of Care    [name] has a current medication list which includes the following long-term medication(s): albuterol, amlodipine, atorvastatin, cetirizine hcl, diphenhydramine, duloxetine, fluticasone, furosemide, hydrochlorothiazide, losartan, methocarbamol, montelukast, pantoprazole, potassium chloride sa, and pregabalin.  Pharmacotherapy (Medications Ordered): Meds ordered this encounter  Medications    buprenorphine (BUTRANS) 7.5 MCG/HR    Sig: Place 1 patch onto the skin once a week.    Dispense:  4 patch    Refill:  1    Chronic Pain: STOP Act (Not applicable) Fill 1 day early if closed on refill date. Avoid benzodiazepines within 8 hours of opioids   Continue Lyrica and Cymbalta as prescribed   Follow-up plan:   Return in about 8 weeks (around 06/20/2021) for Medication Management, in person.    Recent Visits Date Type Provider Dept  04/02/21 Office Visit Anita Santa, MD [facility]  03/25/21 Procedure visit Anita Santa, MD [facility]  03/19/21 Telemedicine Anita Santa, MD [facility]  Showing recent visits within past 90 days and meeting all other requirements Today's Visits Date Type Provider Dept  04/25/21 Office Visit Anita Santa, MD [facility]  Showing today's visits and meeting all other requirements Future Appointments Date Type Provider Dept  06/13/21 Appointment Anita Santa, MD [facility]  Showing future appointments within next 90 days and meeting all other requirements I discussed the assessment and treatment plan with the patient. The patient was provided an opportunity to ask questions and all were answered. The patient agreed with the plan and demonstrated an understanding of the instructions.  Patient advised to call back or seek an in-person evaluation if the symptoms or condition worsens.  Duration of encounter: 18mnutes.  Note by: BGillis Santa MD Date: 04/25/2021; Time: 2:18 PM

## 2021-05-01 ENCOUNTER — Telehealth: Payer: Self-pay | Admitting: *Deleted

## 2021-05-01 NOTE — Telephone Encounter (Signed)
Received fax from [facility] stating that buprenorphine 7.5 mcg/h needs PA.  Also, the fax states that they do not have this strength in this medication and have asked to send to a different pharmacy.  Attempt to call patient, home phone no longer in service.  Work phone, no answer and voicemail capability.    I have completed PA and just need a different pharmacy.

## 2021-05-02 ENCOUNTER — Encounter: Payer: BC Managed Care – PPO | Admitting: Student in an Organized Health Care Education/Training Program

## 2021-05-07 ENCOUNTER — Telehealth: Payer: Self-pay

## 2021-05-07 NOTE — Telephone Encounter (Signed)
LM that patient has a refill on her medication.

## 2021-05-07 NOTE — Telephone Encounter (Signed)
Pt needs refill on BUTRANS 7.5 MCG patches

## 2021-05-08 ENCOUNTER — Telehealth: Payer: Self-pay | Admitting: Student in an Organized Health Care Education/Training Program

## 2021-05-09 ENCOUNTER — Other Ambulatory Visit: Payer: Self-pay | Admitting: *Deleted

## 2021-05-09 ENCOUNTER — Telehealth: Payer: Self-pay | Admitting: *Deleted

## 2021-05-09 MED ORDER — BUPRENORPHINE 7.5 MCG/HR TD PTWK: 1.0000 | MEDICATED_PATCH | TRANSDERMAL | 1 refills | Status: DC

## 2021-05-09 NOTE — Telephone Encounter (Signed)
Called patient to let her know that buprenorphine had been sent to [facility] on [address] and that the medication has already been approved.

## 2021-05-09 NOTE — Telephone Encounter (Signed)
Patient calling about Rx for buprenorphine patches 7.5 mcg/hr.  The pharmacy that Rx was sent to [facility], does not have this in stock.  Have asked [place] to send to a different pharmacy.  [facility] has chosen [facility] [address].

## 2021-05-22 ENCOUNTER — Ambulatory Visit: Payer: BC Managed Care – PPO | Admitting: Dietician

## 2021-06-13 ENCOUNTER — Ambulatory Visit
Payer: BC Managed Care – PPO | Attending: Student in an Organized Health Care Education/Training Program | Admitting: Student in an Organized Health Care Education/Training Program

## 2021-06-13 ENCOUNTER — Encounter: Payer: Self-pay | Admitting: Student in an Organized Health Care Education/Training Program

## 2021-06-13 ENCOUNTER — Other Ambulatory Visit: Payer: Self-pay

## 2021-06-13 VITALS — BP 132/76 | HR 59 | Temp 97.0°F | Resp 16 | Ht 63.0 in | Wt 232.0 lb

## 2021-06-13 DIAGNOSIS — M47816 Spondylosis without myelopathy or radiculopathy, lumbar region: Secondary | ICD-10-CM | POA: Insufficient documentation

## 2021-06-13 DIAGNOSIS — M542 Cervicalgia: Secondary | ICD-10-CM | POA: Insufficient documentation

## 2021-06-13 DIAGNOSIS — M5412 Radiculopathy, cervical region: Secondary | ICD-10-CM | POA: Insufficient documentation

## 2021-06-13 DIAGNOSIS — G894 Chronic pain syndrome: Secondary | ICD-10-CM | POA: Insufficient documentation

## 2021-06-13 DIAGNOSIS — M7918 Myalgia, other site: Secondary | ICD-10-CM | POA: Insufficient documentation

## 2021-06-13 DIAGNOSIS — M47812 Spondylosis without myelopathy or radiculopathy, cervical region: Secondary | ICD-10-CM | POA: Diagnosis present

## 2021-06-13 DIAGNOSIS — G8929 Other chronic pain: Secondary | ICD-10-CM | POA: Diagnosis present

## 2021-06-13 DIAGNOSIS — M5416 Radiculopathy, lumbar region: Secondary | ICD-10-CM | POA: Insufficient documentation

## 2021-06-13 MED ORDER — BUPRENORPHINE 7.5 MCG/HR TD PTWK: 1.0000 | MEDICATED_PATCH | TRANSDERMAL | 2 refills | Status: DC

## 2021-06-13 MED ORDER — PREGABALIN 75 MG PO CAPS: 75.0000 mg | ORAL_CAPSULE | Freq: Three times a day (TID) | ORAL | 11 refills | Status: DC

## 2021-06-13 MED ORDER — METHOCARBAMOL 750 MG PO TABS
750.0000 mg | ORAL_TABLET | Freq: Three times a day (TID) | ORAL | 11 refills | Status: DC | PRN
Start: 2021-06-13 — End: 2021-10-01

## 2021-06-13 NOTE — Progress Notes (Signed)
PROVIDER NOTE: Information contained herein reflects review and annotations entered in association with encounter. Interpretation of such information and data should be left to medically-trained personnel. Information provided to patient can be located elsewhere in the medical record under "Patient Instructions". Document created using STT-dictation technology, any transcriptional errors that may result from process are unintentional.    Patient: Anita Gibbs  Service Category: E/M  Provider: Gillis Santa, MD  DOB: 01/15/1971  DOS: 06/13/2021  Specialty: Interventional Pain Management  MRN: [id number]  Setting: Ambulatory outpatient  PCP: McLean-Scocuzza, Anita Glow, MD  Type: Established Patient    Referring Provider: Orland Mustard *  Location: Office  Delivery: Face-to-face     HPI  [name], a 50 y.o. year old female, is here today because of her Right lumbar radiculitis [M54.16]. Ms. Dier primary complain today is Back Pain (lower) Last encounter: [name] was on 04/25/2021. Pertinent problems: [name] Lumbar facet syndrome (Bilateral) (R>L); Spondylosis without myelopathy or radiculopathy, lumbosacral region; and Morbid obesity with BMI of 45.0-49.9, adult ([place]) on their pertinent problem list. Pain Assessment: Severity of Chronic pain is reported as a 0-No pain/10. Location: Back Lower/down right leg to toes. Onset: More than a month ago. Quality: Constant, Throbbing. Timing: Constant (constant without meds). Modifying factor(s): heat, ice, patches, meds. Vitals:  height is '5\' 3"'  (1.6 m) and weight is 232 lb (105.2 kg). Her temporal temperature is 97 F (36.1 C) (abnormal). Her blood pressure is 132/76 and her pulse is 59 (abnormal). Her respiration is 16 and oxygen saturation is 100%.   Reason for encounter: medication management.    Patient presents today for medication management.  She is endorsing analgesic and functional benefit with  Butrans patch at 7.5 mcg an hour.  She states that she is more functional, has significantly less pain and has better quality of life.  For example, she states that last Saturday she went to her concealed carry course and after that she volunteered at the fair.  She states that she would not have been able to do this before.  She has not had any side effects with this medication.  She is also noticing improved sleep.  We will refill as below.  Continue Robaxin and Lyrica as prescribed at current dose.   Pharmacotherapy Assessment  Analgesic: Butrans 7.5 mcg an hour  Monitoring: [place] PMP: PDMP reviewed during this encounter.       Pharmacotherapy: No side-effects or adverse reactions reported. Compliance: No problems identified. Effectiveness: Clinically acceptable.  Rise Patience, RN  06/13/2021  8:56 AM  Sign when Signing Visit Nursing Pain Medication Assessment:  Safety precautions to be maintained throughout the outpatient stay will include: orient to surroundings, keep bed in low position, maintain call bell within reach at all times, provide assistance with transfer out of bed and ambulation.  Medication Inspection Compliance: [name] to bring her pills to be counted. She was reminded that bringing the medication bottles, even when empty, is a requirement.  Medication: None brought in. Pill/Patch Count: None available to be counted. Bottle Appearance: No container available. Did not bring bottle(s) to appointment. Filled Date: N/A Last Medication intake:   Last patch applied 06/10/2021  UDS:  Summary  Date Value Ref Range Status  02/01/2020 Note  Final    Comment:    ==================================================================== [employer] Select 13 (MW) ==================================================================== Test  Result       Flag       Units Drug Present not Declared for Prescription Verification    Carboxy-THC                    20           UNEXPECTED ng/mg creat    Carboxy-THC is a metabolite of tetrahydrocannabinol (THC). Source of    THC is most commonly herbal marijuana or marijuana-based products,    but THC is also present in a scheduled prescription medication.    Trace amounts of THC can be present in hemp and cannabidiol (CBD)    products. This test is not intended to distinguish between delta-9-    tetrahydrocannabinol, the predominant form of THC in most herbal or    marijuana-based products, and delta-8-tetrahydrocannabinol. Drug Absent but Declared for Prescription Verification   Oxycodone                      Not Detected UNEXPECTED ng/mg creat ==================================================================== Test                      Result    Flag   Units      Ref Range   Creatinine              230              mg/dL      >=20 ==================================================================== Declared Medications:  The flagging and interpretation on this report are based on the  following declared medications.  Unexpected results may arise from  inaccuracies in the declared medications.  **Note: The testing scope of this panel includes these medications:  Oxycodone (Roxicodone)  **Note: The testing scope of this panel does not include the  following reported medications:  Albuterol (Ventolin HFA)  Amlodipine (Norvasc)  Atorvastatin (Lipitor)  Cetirizine  Diphenhydramine (Benadryl)  Epinephrine (EpiPen)  Fluticasone (Flonase)  Gabapentin (Neurontin)  Hydrochlorothiazide  Losartan (Cozaar)  Meloxicam (Mobic)  Methocarbamol (Robaxin)  Montelukast (Singulair)  Norethindrone (Aygestin)  Pantoprazole (Protonix)  Vitamin D3 ==================================================================== For clinical consultation, please call [phone]. ====================================================================      ROS  Constitutional: Denies any  fever or chills Gastrointestinal: No reported hemesis, hematochezia, vomiting, or acute GI distress Musculoskeletal:  Right low back pain  Neurological: No reported episodes of acute onset apraxia, aphasia, dysarthria, agnosia, amnesia, paralysis, loss of coordination, or loss of consciousness  Medication Review  Cetirizine HCl, DULoxetine, EPINEPHrine, Lumbar Back Brace/Support Pad, Olopatadine HCl, Vitamin D-3, albuterol, amLODipine, atorvastatin, buprenorphine, clobetasol cream, diphenhydrAMINE, fluticasone, furosemide, hydrochlorothiazide, hydrocortisone, losartan, meloxicam, methocarbamol, montelukast, pantoprazole, potassium chloride SA, and pregabalin  History Review  Allergy: Ms. Burkley is allergic to other, pineapple, shellfish allergy, strawberry (diagnostic), and tomato. Drug: Ms. Troutman  reports no history of drug use. Alcohol:  reports current alcohol use. Tobacco:  reports that she has never smoked. She has never used smokeless tobacco. Social: Ms. Petrilla  reports that she has never smoked. She has never used smokeless tobacco. She reports current alcohol use. She reports that she does not use drugs. Medical:  has a past medical history of Arthritis, Carpal boss, right, Carpal tunnel syndrome, bilateral, Headache, Hives (09/05/2020), Hypertension, and Seasonal asthma (11/29/2018). Surgical: Ms. Mateja  has a past surgical history that includes Knee arthroscopy (Right); Anterior cervical decomp/discectomy fusion (N/A, 07/01/2017); and Abdominal hysterectomy (10/17/2020). Family: family history includes [name] in her mother; Arthritis in her father, mother, and sister;  Breast cancer in her maternal aunt and paternal aunt; COPD in her brother and son; Cancer in her brother; Depression in her son; Diabetes in her mother; Early death in her brother; Hearing loss in her father; Hyperlipidemia in her mother; Hypertension in her father, mother, and sister; Kidney disease in her father;  Lymphoma in her father; Sleep apnea in her daughter.  Laboratory Chemistry Profile   Renal Lab Results  Component Value Date   BUN 18 02/18/2021   CREATININE 0.93 73/71/0626   BCR NOT APPLICABLE 94/85/4627   GFR 73.14 02/18/2021   GFRAA 76 09/07/2018   GFRNONAA 66 09/07/2018    Hepatic Lab Results  Component Value Date   AST 31 02/13/2021   ALT 47 (H) 02/13/2021   ALBUMIN 4.2 01/24/2021   ALKPHOS 61 01/24/2021    Electrolytes Lab Results  Component Value Date   NA 138 02/18/2021   K 3.9 02/18/2021   CL 104 02/18/2021   CALCIUM 9.3 02/18/2021   MG 2.2 09/07/2018    Bone Lab Results  Component Value Date   VD25OH 54.97 01/24/2021   25OHVITD1 8.1 (L) 09/07/2018   25OHVITD2 <1.0 09/07/2018   25OHVITD3 8.1 09/07/2018    Inflammation (CRP: Acute Phase) (ESR: Chronic Phase) Lab Results  Component Value Date   CRP <1.0 01/24/2021   ESRSEDRATE 37 (H) 01/24/2021         Note: Above Lab results reviewed.  Physical Exam  General appearance: Well nourished, well developed, and well hydrated. In no apparent acute distress Mental status: Alert, oriented x 3 (person, place, & time)       Respiratory: No evidence of acute respiratory distress Eyes: PERLA Vitals: BP 132/76 (BP Location: Right Arm, Patient Position: Sitting, Cuff Size: Large)   Pulse (!) 59   Temp (!) 97 F (36.1 C) (Temporal)   Resp 16   Ht '5\' 3"'  (1.6 m)   Wt 232 lb (105.2 kg)   LMP  (LMP Unknown)   SpO2 100%   BMI 41.10 kg/m  BMI: Estimated body mass index is 41.1 kg/m as calculated from the following:   Height as of this encounter: '5\' 3"'  (1.6 m).   Weight as of this encounter: 232 lb (105.2 kg). Ideal: Ideal body weight: 52.4 kg (115 lb 8.3 oz) Adjusted ideal body weight: 73.5 kg (162 lb 1.8 oz)  Lumbar Spine Area Exam  Skin & Axial Inspection: No masses, redness, or swelling Alignment: Symmetrical Functional ROM: Unrestricted ROM       Stability: No instability detected Muscle  Tone/Strength: Functionally intact. No obvious neuro-muscular anomalies detected. Sensory (Neurological): Musculoskeletal pain pattern   Lower Extremity Exam      Side: Right lower extremity   Side: Left lower extremity  Stability: No instability observed           Stability: No instability observed          Skin & Extremity Inspection: Skin color, temperature, and hair growth are WNL. No peripheral edema or cyanosis. No masses, redness, swelling, asymmetry, or associated skin lesions. No contractures.   Skin & Extremity Inspection: Skin color, temperature, and hair growth are WNL. No peripheral edema or cyanosis. No masses, redness, swelling, asymmetry, or associated skin lesions. No contractures.  Functional ROM: Unrestricted ROM                   Functional ROM: Unrestricted ROM                  Muscle  Tone/Strength: Functionally intact. No obvious neuro-muscular anomalies detected.   Muscle Tone/Strength: Functionally intact. No obvious neuro-muscular anomalies detected.  Sensory (Neurological): Unimpaired         Sensory (Neurological): Unimpaired        DTR: Patellar: deferred today Achilles: deferred today Plantar: deferred today   DTR: Patellar: deferred today Achilles: deferred today Plantar: deferred today  Palpation: No palpable anomalies   Palpation: No palpable anomalies    Assessment   Status Diagnosis  Controlled Controlled Controlled 1. Lumbar radiculitis (L5 dermatome)    2. Lumbar facet arthropathy   3. Cervicalgia (Primary Area of Pain) (Bilateral) (L>R)   4. Spondylosis without myelopathy or radiculopathy, cervical region   5. Cervical radicular pain   6. Chronic pain syndrome   7. Chronic musculoskeletal pain       Plan of Care    [name] has a current medication list which includes the following long-term medication(s): albuterol, amlodipine, atorvastatin, cetirizine hcl, diphenhydramine, duloxetine, fluticasone, furosemide,  hydrochlorothiazide, losartan, montelukast, pantoprazole, potassium chloride sa, methocarbamol, and pregabalin.  Pharmacotherapy (Medications Ordered): Meds ordered this encounter  Medications   buprenorphine (BUTRANS) 7.5 MCG/HR    Sig: Place 1 patch onto the skin once a week.    Dispense:  4 patch    Refill:  2    Chronic Pain: STOP Act (Not applicable) Fill 1 day early if closed on refill date. Avoid benzodiazepines within 8 hours of opioids   methocarbamol (ROBAXIN) 750 MG tablet    Sig: Take 1 tablet (750 mg total) by mouth every 8 (eight) hours as needed for muscle spasms.    Dispense:  90 tablet    Refill:  11    Fill one day early if pharmacy is closed on scheduled refill date. May substitute for generic if available.   pregabalin (LYRICA) 75 MG capsule    Sig: Take 1 capsule (75 mg total) by mouth 3 (three) times daily.    Dispense:  90 capsule    Refill:  11    Fill one day early if pharmacy is closed on scheduled refill date. May substitute for generic if available.    Follow-up plan:   Return in about 16 weeks (around 10/03/2021) for Medication Management, in person.    Recent Visits Date Type Provider Dept  04/25/21 Office Visit Gillis Santa, MD [facility]  04/02/21 Office Visit Gillis Santa, MD [facility]  03/25/21 Procedure visit Gillis Santa, MD [facility]  03/19/21 Telemedicine Gillis Santa, MD [facility]  Showing recent visits within past 90 days and meeting all other requirements Today's Visits Date Type Provider Dept  06/13/21 Office Visit Gillis Santa, MD [facility]  Showing today's visits and meeting all other requirements Future Appointments No visits were found meeting these conditions. Showing future appointments within next 90 days and meeting all other requirements I discussed the assessment and treatment plan with the patient. The patient was provided an opportunity to ask questions and all  were answered. The patient agreed with the plan and demonstrated an understanding of the instructions.  Patient advised to call back or seek an in-person evaluation if the symptoms or condition worsens.  Duration of encounter: 60mnutes.  Note by: BGillis Santa MD Date: 06/13/2021; Time: 9:28 AM

## 2021-06-13 NOTE — Progress Notes (Signed)
Nursing Pain Medication Assessment:  Safety precautions to be maintained throughout the outpatient stay will include: orient to surroundings, keep bed in low position, maintain call bell within reach at all times, provide assistance with transfer out of bed and ambulation.  Medication Inspection Compliance: Anita Gibbs did not comply with our request to bring her pills to be counted. She was reminded that bringing the medication bottles, even when empty, is a requirement.  Medication: None brought in. Pill/Patch Count: None available to be counted. Bottle Appearance: No container available. Did not bring bottle(s) to appointment. Filled Date: N/A Last Medication intake:   Last patch applied 06/10/2021

## 2021-06-19 ENCOUNTER — Other Ambulatory Visit: Payer: Self-pay

## 2021-06-19 ENCOUNTER — Ambulatory Visit
Admission: RE | Admit: 2021-06-19 | Discharge: 2021-06-19 | Disposition: A | Discharge status: Home / Self Care | Payer: BC Managed Care – PPO | Source: Ambulatory Visit | Attending: Internal Medicine | Admitting: Internal Medicine

## 2021-06-19 DIAGNOSIS — D3502 Benign neoplasm of left adrenal gland: Secondary | ICD-10-CM

## 2021-06-27 ENCOUNTER — Other Ambulatory Visit: Payer: Self-pay | Admitting: Internal Medicine

## 2021-06-27 DIAGNOSIS — I10 Essential (primary) hypertension: Secondary | ICD-10-CM

## 2021-07-04 ENCOUNTER — Encounter: Payer: Self-pay | Admitting: Family Medicine

## 2021-07-04 ENCOUNTER — Telehealth (INDEPENDENT_AMBULATORY_CARE_PROVIDER_SITE_OTHER): Payer: BC Managed Care – PPO | Admitting: Family Medicine

## 2021-07-04 VITALS — BP 132/67 | Temp 99.0°F

## 2021-07-04 DIAGNOSIS — R059 Cough, unspecified: Secondary | ICD-10-CM

## 2021-07-04 DIAGNOSIS — J029 Acute pharyngitis, unspecified: Secondary | ICD-10-CM

## 2021-07-04 DIAGNOSIS — R0981 Nasal congestion: Secondary | ICD-10-CM

## 2021-07-04 MED ORDER — DOXYCYCLINE HYCLATE 100 MG PO TABS: 100.0000 mg | ORAL_TABLET | Freq: Two times a day (BID) | ORAL | 0 refills | Status: DC

## 2021-07-04 NOTE — Patient Instructions (Signed)
-  I sent the medication(s) we discussed to your pharmacy: Meds ordered this encounter  Medications   doxycycline (VIBRA-TABS) 100 MG tablet    Sig: Take 1 tablet (100 mg total) by mouth 2 (two) times daily.    Dispense:  20 tablet    Refill:  0   Nasal saline twice daily  Avoid dairy and sweets if you can   I hope you are feeling better soon!  Seek in person care promptly if your symptoms worsen, new concerns arise or you are not improving with treatment.  It was nice to meet you today. I help [place] out with telemedicine visits on Tuesdays and Thursdays and am available for visits on those days. If you have any concerns or questions following this visit please schedule a follow up visit with your Primary Care doctor or seek care at a local urgent care clinic to avoid delays in care.

## 2021-07-04 NOTE — Progress Notes (Addendum)
Virtual Visit via Video Note  I connected with Mirjana  on 07/04/21 at 10:00 AM EDT by a video enabled telemedicine application and verified that I am speaking with the correct person using two identifiers.  Location patient:[place]  Location provider:work or home office Persons participating in the virtual visit: patient, provider  I discussed the limitations of evaluation and management by telemedicine and the availability of in person appointments. The patient expressed understanding and agreed to proceed.   HPI:  Acute telemedicine visit for cough and congestion: -Onset: 3 weeks -Symptoms include:sinus congestion, yellow mucus, sinus headache, not improving - worsening, cough, muck in the L eye the last few days -Denies:fevers, CP, SOB, NVD, inability to eat/drink/get out of bed -Has tried: salt water gargles -Pertinent past medical history:see below -Pertinent medication allergies:  Allergies  Allergen Reactions   Other Hives    White and red sauces   Pineapple     Swelling    Shellfish Allergy Hives   Strawberry (Diagnostic) Hives   Tomato Hives    cherry  -COVID-19 vaccine status: Immunization History  Administered Date(s) Administered   Influenza,inj,Quad PF,6+ Mos 05/31/2018, 08/15/2019, 08/03/2020   PFIZER(Purple Top)SARS-COV-2 Vaccination 01/24/2020, 02/14/2020, 09/03/2020   Tdap 10/13/2019    ROS: See pertinent positives and negatives per HPI.  Past Medical History:  Diagnosis Date   Arthritis    Carpal boss, right    Carpal tunnel syndrome, bilateral    Headache    Hives 09/05/2020   Hypertension    Seasonal asthma 11/29/2018    Past Surgical History:  Procedure Laterality Date   ABDOMINAL HYSTERECTOMY  10/17/2020   [facility]   ANTERIOR CERVICAL DECOMP/DISCECTOMY FUSION N/A 07/01/2017   Procedure: ANTERIOR CERVICAL DECOMPRESSION/DISCECTOMY FUSION 3 LEVELS;  Surgeon: Meade Maw, MD;  Location: [facility];  Service: Neurosurgery;  Laterality: N/A;   KNEE  ARTHROSCOPY Right      Current Outpatient Medications:    doxycycline (VIBRA-TABS) 100 MG tablet, Take 1 tablet (100 mg total) by mouth 2 (two) times daily., Disp: 20 tablet, Rfl: 0   albuterol (PROVENTIL HFA;VENTOLIN HFA) 108 (90 Base) MCG/ACT inhaler, Inhale 2 puffs into the lungs every 6 (six) hours as needed for wheezing or shortness of breath., Disp: 1 Inhaler, Rfl: 5   amLODipine (NORVASC) 5 MG tablet, TAKE 1 TABLET BY MOUTH DAILY., Disp: 90 tablet, Rfl: 1   atorvastatin (LIPITOR) 10 MG tablet, TAKE 1 TABLET BY MOUTH DAILY AT 6PM, Disp: 90 tablet, Rfl: 3   [START ON 07/13/2021] buprenorphine (BUTRANS) 7.5 MCG/HR, Place 1 patch onto the skin once a week., Disp: 4 patch, Rfl: 2   Cetirizine HCl (ZYRTEC PO), Take 25 mg by mouth., Disp: , Rfl:    Cholecalciferol (VITAMIN D-3) 125 MCG (5000 UT) TABS, Take by mouth daily., Disp: , Rfl:    clobetasol cream (TEMOVATE) 9.79 %, Apply 1 application topically 2 (two) times daily. Prn, Disp: 60 g, Rfl: 2   diphenhydrAMINE (BENADRYL) 25 mg capsule, Take 25 mg by mouth every 6 (six) hours as needed (for allergies.)., Disp: , Rfl:    DULoxetine (CYMBALTA) 20 MG capsule, Take 1 capsule (20 mg total) by mouth daily., Disp: 90 capsule, Rfl: 3   Elastic Bandages & Supports (LUMBAR BACK BRACE/SUPPORT PAD) MISC, 1 lumbar back brace for lumbar facet pain, Disp: 1 each, Rfl: 0   EPINEPHrine (AUVI-Q) 0.3 mg/0.3 mL IJ SOAJ injection, Use as directed for severe allergic reaction, Disp: 2 Device, Rfl: 2   fluticasone (FLONASE) 50 MCG/ACT nasal spray,  Place 2 sprays into both nostrils daily., Disp: 16 g, Rfl: 6   furosemide (LASIX) 20 MG tablet, Take 1 tablet (20 mg total) by mouth daily. X 5-7 days, Disp: 7 tablet, Rfl: 0   hydrochlorothiazide (HYDRODIURIL) 25 MG tablet, TAKE 1/2 TABLET BY MOUTH EVERY MORNING AS DIRECTED, Disp: 90 tablet, Rfl: 3   hydrocortisone 2.5 % cream, Apply topically 2 (two) times daily. Prn to eyelids, Disp: 30 g, Rfl: 0   losartan (COZAAR)  100 MG tablet, TAKE ONE TABLET EVERY DAY, Disp: 90 tablet, Rfl: 3   meloxicam (MOBIC) 15 MG tablet, TAKE ONE TABLET BY MOUTH EVERY DAY AS NEEDED, Disp: 90 tablet, Rfl: 0   methocarbamol (ROBAXIN) 750 MG tablet, Take 1 tablet (750 mg total) by mouth every 8 (eight) hours as needed for muscle spasms., Disp: 90 tablet, Rfl: 11   montelukast (SINGULAIR) 10 MG tablet, Take 1 tablet (10 mg total) by mouth at bedtime., Disp: 90 tablet, Rfl: 3   Olopatadine HCl 0.2 % SOLN, Apply 1 drop to eye daily. Both eyes, Disp: 2.5 mL, Rfl: 11   pantoprazole (PROTONIX) 40 MG tablet, TAKE ONE TABLET EVERY DAY 30 MIN BEFORE DINNER, Disp: 90 tablet, Rfl: 3   potassium chloride SA (KLOR-CON) 20 MEQ tablet, Take 1 tablet (20 mEq total) by mouth daily., Disp: 7 tablet, Rfl: 0   pregabalin (LYRICA) 75 MG capsule, Take 1 capsule (75 mg total) by mouth 3 (three) times daily., Disp: 90 capsule, Rfl: 11  EXAM:  VITALS per patient if applicable:  GENERAL: alert, oriented, appears well and in no acute distress  HEENT: atraumatic, conjunttiva clear, no obvious abnormalities on inspection of external nose and ears  NECK: normal movements of the head and neck  LUNGS: on inspection no signs of respiratory distress, breathing rate appears normal, no obvious gross SOB, gasping or wheezing  CV: no obvious cyanosis  MS: moves all visible extremities without noticeable abnormality  PSYCH/NEURO: pleasant and cooperative, no obvious depression or anxiety, speech and thought processing grossly intact  ASSESSMENT AND PLAN:  Discussed the following assessment and plan:  Nasal sinus congestion  Sore throat  Cough, unspecified type  -we discussed possible serious and likely etiologies, options for evaluation and workup, limitations of telemedicine visit vs in person visit, treatment, treatment risks and precautions. Pt is agreeable to treatment via telemedicine at this moment. Given duration of symptoms and worsening, query  sinusitis vs other. She opted for empiric tx with doxy 100mg  bid x10 days.  Advised to seek prompt in person care if worsening, new symptoms arise, or if is not improving with treatment. Discussed options for inperson care if PCP office not available. Did let this patient know that I only do telemedicine on Tuesdays and Thursdays for [place]. Advised to schedule follow up visit with PCP or [facility] if any further questions or concerns to avoid delays in care.   I discussed the assessment and treatment plan with the patient. The patient was provided an opportunity to ask questions and all were answered. The patient agreed with the plan and demonstrated an understanding of the instructions.     Lucretia Kern, DO

## 2021-07-05 ENCOUNTER — Telehealth: Payer: Self-pay | Admitting: Internal Medicine

## 2021-07-05 NOTE — Telephone Encounter (Signed)
Patient was seen via video visit yesterday for her symptoms.

## 2021-07-05 NOTE — Telephone Encounter (Signed)
Noted  

## 2021-07-17 ENCOUNTER — Encounter: Payer: Self-pay | Admitting: Dietician

## 2021-07-17 ENCOUNTER — Other Ambulatory Visit: Payer: Self-pay

## 2021-07-17 ENCOUNTER — Encounter: Payer: BC Managed Care – PPO | Attending: Internal Medicine | Admitting: Dietician

## 2021-07-17 VITALS — Ht 63.0 in | Wt 252.8 lb

## 2021-07-17 DIAGNOSIS — Z6841 Body Mass Index (BMI) 40.0 and over, adult: Secondary | ICD-10-CM | POA: Diagnosis not present

## 2021-07-17 DIAGNOSIS — R7303 Prediabetes: Secondary | ICD-10-CM

## 2021-07-17 DIAGNOSIS — I10 Essential (primary) hypertension: Secondary | ICD-10-CM | POA: Diagnosis not present

## 2021-07-17 NOTE — Patient Instructions (Signed)
Plan to have something to eat every 4-5 hours during the day.  Try out some protein shakes as a quick and simple option for morning nutrition if needed.  Control portions of meats (choose lean meats such as 90% lean ground beef, pork tenderloin, skinless chicken or [place]) Limit or reduce added fats like butter on foods.  Eat generous portions of low carb vegetables.

## 2021-07-17 NOTE — Progress Notes (Signed)
Medical Nutrition Therapy: Visit start time: 0950  end time: 1100  Assessment:  Diagnosis: pre-diabetes, HTN, obesity Past medical history: hyperlipidemia, gout sleep apnea Psychosocial issues/ stress concerns: patient reports some symptoms of depression, also reports occasional panic attacks  Preferred learning method:  Auditory   Current weight: 252.8lbs Height: 5'3" BMI: 44.78 Medications, supplements: reconciled list in medical record  Progress and evaluation:  Patient reports difficulty with weight loss, mostly due to limited physical activity  since experiencing significant back, knee, foot, and hand pain.   She has hunger and increased appetite during evening hours, feels she is eating the bulk of her calories late in the day and resting soon after.  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 Dining out frequency: 4 meals per week.   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) Supper: 7pm - 11/15-- ribs, string beans, potato salad; usually meat + veg + starch Snack: ice cream Beverages: water, tropicana pineapple orange drink; occasional beer; occasional tea when eating out  Nutrition Care Education: Topics covered:  Basic nutrition: basic food groups, appropriate nutrient balance, appropriate meal and snack schedule, general nutrition guidelines    Weight control: importance of low sugar and low fat choices; portion control strategies including increasing low cal food portions while decreasing higher-cal portions; effects of skipped meals on BG, insulin, and metabolic rate Pre-Diabetes:  goals for BGs, appropriate meal and snack schedule, appropriate carb intake and balance, healthy carb choices, role  of fiber, protein, fat Hypertension: identifying high sodium foods, identifying food sources of potassium, magnesium   Nutritional Diagnosis:  [place]-2.2 Altered nutrition-related laboratory As related to pre-diabetes.  As evidenced by above-normal BG test results. [place]-3.3 Overweight/obesity As related to excess calories, limited physical activity due to chronic pain.  As evidenced by patient with current BMI of 44.78.  Intervention:  Instruction and discussion as noted above. Patient reports readiness to work on diet and lifestyle changes.  Established nutrition goals with direction from patient.   Education Materials given:  Museum/gallery conservator with food lists, sample meal pattern Sample menus Visit summary with goals/ instructions   Learner/ who was taught:  Patient   Level of understanding: Verbalizes/ demonstrates competency   Demonstrated degree of understanding via:   Teach back Learning barriers: None  Willingness to learn/ readiness for change: Acceptance, ready for change   Monitoring and Evaluation:  Dietary intake, exercise, BG control, BP control, and body weight      follow up:  09/04/21 at 9:45am

## 2021-07-31 ENCOUNTER — Encounter: Payer: Self-pay | Admitting: Internal Medicine

## 2021-07-31 ENCOUNTER — Ambulatory Visit: Payer: BC Managed Care – PPO | Admitting: Internal Medicine

## 2021-07-31 ENCOUNTER — Other Ambulatory Visit: Payer: Self-pay

## 2021-07-31 VITALS — BP 124/74 | HR 72 | Temp 96.7°F | Ht 63.0 in | Wt 249.0 lb

## 2021-07-31 DIAGNOSIS — M722 Plantar fascial fibromatosis: Secondary | ICD-10-CM

## 2021-07-31 DIAGNOSIS — Z23 Encounter for immunization: Secondary | ICD-10-CM

## 2021-07-31 DIAGNOSIS — K219 Gastro-esophageal reflux disease without esophagitis: Secondary | ICD-10-CM

## 2021-07-31 DIAGNOSIS — G5603 Carpal tunnel syndrome, bilateral upper limbs: Secondary | ICD-10-CM | POA: Diagnosis not present

## 2021-07-31 DIAGNOSIS — Z0000 Encounter for general adult medical examination without abnormal findings: Secondary | ICD-10-CM

## 2021-07-31 DIAGNOSIS — G629 Polyneuropathy, unspecified: Secondary | ICD-10-CM

## 2021-07-31 DIAGNOSIS — E785 Hyperlipidemia, unspecified: Secondary | ICD-10-CM

## 2021-07-31 DIAGNOSIS — Z1231 Encounter for screening mammogram for malignant neoplasm of breast: Secondary | ICD-10-CM

## 2021-07-31 DIAGNOSIS — I10 Essential (primary) hypertension: Secondary | ICD-10-CM

## 2021-07-31 DIAGNOSIS — M5417 Radiculopathy, lumbosacral region: Secondary | ICD-10-CM

## 2021-07-31 DIAGNOSIS — R600 Localized edema: Secondary | ICD-10-CM

## 2021-07-31 DIAGNOSIS — R7303 Prediabetes: Secondary | ICD-10-CM

## 2021-07-31 MED ORDER — ATORVASTATIN CALCIUM 10 MG PO TABS: ORAL_TABLET | ORAL | 3 refills | Status: DC

## 2021-07-31 MED ORDER — AMLODIPINE BESYLATE 5 MG PO TABS: 5.0000 mg | ORAL_TABLET | Freq: Every day | ORAL | 3 refills | Status: DC

## 2021-07-31 MED ORDER — POTASSIUM CHLORIDE CRYS ER 20 MEQ PO TBCR: 20.0000 meq | EXTENDED_RELEASE_TABLET | Freq: Every day | ORAL | 11 refills | Status: DC | PRN

## 2021-07-31 MED ORDER — FUROSEMIDE 20 MG PO TABS: 20.0000 mg | ORAL_TABLET | Freq: Every day | ORAL | 11 refills | Status: DC | PRN

## 2021-07-31 MED ORDER — PANTOPRAZOLE SODIUM 40 MG PO TBEC: DELAYED_RELEASE_TABLET | ORAL | 3 refills | Status: DC

## 2021-07-31 MED ORDER — MELOXICAM 15 MG PO TABS
15.0000 mg | ORAL_TABLET | ORAL | 2 refills | Status: DC | PRN
Start: 2021-07-31 — End: 2021-12-24

## 2021-07-31 MED ORDER — LOSARTAN POTASSIUM 100 MG PO TABS: 100.0000 mg | ORAL_TABLET | Freq: Every day | ORAL | 3 refills | Status: DC

## 2021-07-31 NOTE — Progress Notes (Signed)
Chief Complaint  Patient presents with   Annual Exam   Annual  1. Htn controlled on norvasc 5 mg qd and hctz 25 mg and losartan 100 mg qd will take prn lasix for leg edema  2. B/l CTS 08/12/21 sch Dr. Amedeo Plenty  3. Asks if candidate for disability disc evermed disability    Review of Systems  Constitutional:  Negative for weight loss.  HENT:  Negative for hearing loss.   Eyes:  Negative for blurred vision.  Respiratory:  Negative for shortness of breath.   Cardiovascular:  Negative for chest pain.  Gastrointestinal:  Negative for abdominal pain and blood in stool.  Genitourinary:  Negative for dysuria.  Musculoskeletal:  Negative for falls and joint pain.  Skin:  Negative for rash.  Neurological:  Positive for sensory change. Negative for headaches.       Numbness b/l arms and legs   Psychiatric/Behavioral:  Negative for depression.   Past Medical History:  Diagnosis Date   Arthritis    Carpal boss, right    Carpal tunnel syndrome, bilateral    Headache    Hives 09/05/2020   Hypertension    Seasonal asthma 11/29/2018   Past Surgical History:  Procedure Laterality Date   ABDOMINAL HYSTERECTOMY  10/17/2020   [facility]   ANTERIOR CERVICAL DECOMP/DISCECTOMY FUSION N/A 07/01/2017   Procedure: ANTERIOR CERVICAL DECOMPRESSION/DISCECTOMY FUSION 3 LEVELS;  Surgeon: Meade Maw, MD;  Location: [facility] ORS;  Service: Neurosurgery;  Laterality: N/A;   KNEE ARTHROSCOPY Right    Family History  Problem Relation Age of Onset   [name] Mother    Diabetes Mother    Arthritis Mother    Hypertension Mother    Hyperlipidemia Mother    Hypertension Father    Arthritis Father    Hearing loss Father    Kidney disease Father    Lymphoma Father        large b cell    Arthritis Sister    Hypertension Sister    Cancer Brother        ? lung due to exposure    COPD Brother    Early death Brother    COPD Son    Depression Son    Breast cancer Maternal Aunt    Breast cancer Paternal Aunt     Sleep apnea Daughter    Allergic rhinitis Neg Hx    Angioedema Neg Hx    Eczema Neg Hx    Urticaria Neg Hx    Asthma Neg Hx    Social History   Socioeconomic History   Marital status: Widowed    Spouse name: Not on file   Number of children: Not on file   Years of education: Not on file   Highest education level: Not on file  Occupational History   Not on file  Tobacco Use   Smoking status: Never   Smokeless tobacco: Never  Vaping Use   Vaping Use: Never used  Substance and Sexual Activity   Alcohol use: Yes    Alcohol/week: 3.0 standard drinks    Types: 3 Standard drinks or equivalent per week    Comment: occ   Drug use: No   Sexual activity: Yes    Birth control/protection: None  Other Topics Concern   Not on file  Social History Narrative   Bus driver    And school custodian    1 daughter lives in [place] 76 y.o as of 01/17/20   1 son 80 almost 36 in [place]  [place]   Social Determinants of Health   Financial Resource Strain: Not on file  Food Insecurity: Not on file  Transportation Needs: Not on file  Physical Activity: Not on file  Stress: Not on file  Social Connections: Not on file  Intimate Partner Violence: Not on file   Current Meds  Medication Sig   albuterol (PROVENTIL HFA;VENTOLIN HFA) 108 (90 Base) MCG/ACT inhaler Inhale 2 puffs into the lungs every 6 (six) hours as needed for wheezing or shortness of breath.   buprenorphine (BUTRANS) 7.5 MCG/HR Place 1 patch onto the skin once a week.   Cetirizine HCl (ZYRTEC PO) Take 25 mg by mouth.   Cholecalciferol (VITAMIN D-3) 125 MCG (5000 UT) TABS Take by mouth daily.   clobetasol cream (TEMOVATE) 7.65 % Apply 1 application topically 2 (two) times daily. Prn   Cyanocobalamin (B-12 PO) Take by mouth.   diphenhydrAMINE (BENADRYL) 25 mg capsule Take 25 mg by mouth every 6 (six) hours as needed (for allergies.).   DULoxetine (CYMBALTA) 20 MG capsule Take 1 capsule (20 mg total) by mouth daily.   Elastic  Bandages & Supports (LUMBAR BACK BRACE/SUPPORT PAD) MISC 1 lumbar back brace for lumbar facet pain   EPINEPHrine (AUVI-Q) 0.3 mg/0.3 mL IJ SOAJ injection Use as directed for severe allergic reaction   fluticasone (FLONASE) 50 MCG/ACT nasal spray Place 2 sprays into both nostrils daily.   hydrochlorothiazide (HYDRODIURIL) 25 MG tablet TAKE 1/2 TABLET BY MOUTH EVERY MORNING AS DIRECTED   hydrocortisone 2.5 % cream Apply topically 2 (two) times daily. Prn to eyelids   ibuprofen (ADVIL) 600 MG tablet IBU 600 mg tablet   methocarbamol (ROBAXIN) 750 MG tablet Take 1 tablet (750 mg total) by mouth every 8 (eight) hours as needed for muscle spasms.   montelukast (SINGULAIR) 10 MG tablet Take 1 tablet (10 mg total) by mouth at bedtime.   Olopatadine HCl 0.2 % SOLN Apply 1 drop to eye daily. Both eyes   pregabalin (LYRICA) 75 MG capsule Take 1 capsule (75 mg total) by mouth 3 (three) times daily.   [DISCONTINUED] amLODipine (NORVASC) 5 MG tablet TAKE 1 TABLET BY MOUTH DAILY.   [DISCONTINUED] atorvastatin (LIPITOR) 10 MG tablet TAKE 1 TABLET BY MOUTH DAILY AT 6PM   [DISCONTINUED] furosemide (LASIX) 20 MG tablet Take 1 tablet (20 mg total) by mouth daily. X 5-7 days   [DISCONTINUED] losartan (COZAAR) 100 MG tablet TAKE ONE TABLET EVERY DAY   [DISCONTINUED] meloxicam (MOBIC) 15 MG tablet TAKE ONE TABLET BY MOUTH EVERY DAY AS NEEDED   [DISCONTINUED] pantoprazole (PROTONIX) 40 MG tablet TAKE ONE TABLET EVERY DAY 30 MIN BEFORE DINNER   [DISCONTINUED] potassium chloride SA (KLOR-CON) 20 MEQ tablet Take 1 tablet (20 mEq total) by mouth daily.   Allergies  Allergen Reactions   Other Hives    White and red sauces   Pineapple     Swelling    Shellfish Allergy Hives   Strawberry (Diagnostic) Hives   Tomato Hives    cherry   No results found for this or any previous visit (from the past 2160 hour(s)). Objective  Body mass index is 44.11 kg/m. Wt Readings from Last 3 Encounters:  07/31/21 249 lb (112.9  kg)  07/17/21 252 lb 12.8 oz (114.7 kg)  06/13/21 232 lb (105.2 kg)   Temp Readings from Last 3 Encounters:  07/31/21 (!) 96.7 F (35.9 C) (Temporal)  07/04/21 99 F (37.2 C)  06/13/21 (!) 97 F (36.1 C) (Temporal)   BP  Readings from Last 3 Encounters:  07/31/21 124/74  07/04/21 132/67  06/13/21 132/76   Pulse Readings from Last 3 Encounters:  07/31/21 72  06/13/21 (!) 59  04/25/21 70    Physical Exam Vitals and nursing note reviewed.  Constitutional:      Appearance: Normal appearance. She is well-developed and well-groomed.  HENT:     Head: Normocephalic and atraumatic.  Eyes:     Conjunctiva/sclera: Conjunctivae normal.     Pupils: Pupils are equal, round, and reactive to light.  Cardiovascular:     Rate and Rhythm: Normal rate and regular rhythm.     Heart sounds: Normal heart sounds. No murmur heard. Pulmonary:     Effort: Pulmonary effort is normal.     Breath sounds: Normal breath sounds.  Abdominal:     General: Abdomen is flat. Bowel sounds are normal.     Tenderness: There is no abdominal tenderness.  Musculoskeletal:        General: No tenderness.  Skin:    General: Skin is warm and dry.  Neurological:     General: No focal deficit present.     Mental Status: She is alert and oriented to person, place, and time. Mental status is at baseline.     Cranial Nerves: Cranial nerves 2-12 are intact.     Gait: Gait is intact.  Psychiatric:        Attention and Perception: Attention and perception normal.        Mood and Affect: Mood and affect normal.        Speech: Speech normal.        Behavior: Behavior normal. Behavior is cooperative.        Thought Content: Thought content normal.        Cognition and Memory: Cognition and memory normal.        Judgment: Judgment normal.    Assessment  Plan  Annual physical exam Needs flu shot - Plan: Flu Vaccine QUAD 6+ mos PF IM (Fluarix Quad PF) Tdap utd  covid vaccine pfizer 3/3 consider 4th dose    mammo  09/30/19 normal ordered, 10/12/20 mammogram rec right breast US b/l screening 10/23/20 rec screening in 1 year Ordered   11/17/2019 colonoscopy - GERD hyperplastic polyp f/u in 5 years    S/p hysterectomy 02/26/18 neg neg hpv h/o abnl h/o fibroids Dr.Song right female pelvic pain    rec healthy diet and exercise   Bilateral carpal tunnel syndrome Dr. Amedeo Plenty 08/12/21 CTS b/l sch   Polyneuropathy Lumbosacral radiculopathy at L5 On cymbalta and lyrica cont doses  Est unc neurology   Essential hypertension controlled - Plan: amLODipine (NORVASC) 5 MG tablet, losartan (COZAAR) 100 MG tablet Hctz 25 mg qd  Leg edema prn lasix 20 mg qd and if continues consider vascular consult   Hyperlipidemia, unspecified hyperlipidemia type - Plan: atorvastatin (LIPITOR) 10 MG tablet  Bilateral leg edema - Plan: furosemide (LASIX) 20 MG tablet, potassium chloride SA (KLOR-CON M) 20 MEQ tablet  Gastroesophageal reflux disease, unspecified whether esophagitis present - Plan: pantoprazole (PROTONIX) 40 MG tablet  Plantar fasciitis, bilateral - Plan: meloxicam (MOBIC) 15 MG tablet     Provider: Dr. Olivia Mackie McLean-Scocuzza-Internal Medicine

## 2021-07-31 NOTE — Patient Instructions (Addendum)
Consider 4th covid 19 in 2-4 weeks  Consider try alpha lipoic acid 600 mg 2x per day supplement for neuropathy   [employer] physicals   Aspercream with lidocaine to heels can try only as needed mobic    Take lasix as needed leg swelling and potassium with it      Peripheral Neuropathy Peripheral neuropathy is a type of nerve damage. It affects nerves that carry signals between the spinal cord and the arms, legs, and the rest of the body (peripheral nerves). It does not affect nerves in the spinal cord or brain. In peripheral neuropathy, one nerve or a group of nerves may be damaged. Peripheral neuropathy is a broad category that includes many specific nerve disorders, like diabetic neuropathy, hereditary neuropathy, and carpal tunnel syndrome. What are the causes? This condition may be caused by: Diabetes. This is the most common cause of peripheral neuropathy. Nerve injury. Pressure or stress on a nerve that lasts a long time. Lack (deficiency) of B vitamins. This can result from alcoholism, poor diet, or a restricted diet. Infections. Autoimmune diseases, such as rheumatoid arthritis and systemic lupus erythematosus. Nerve diseases that are passed from parent to child (inherited). Some medicines, such as cancer medicines (chemotherapy). Poisonous (toxic) substances, such as lead and mercury. Too little blood flowing to the legs. Kidney disease. Thyroid disease. In some cases, the cause of this condition is not known. What are the signs or symptoms? Symptoms of this condition depend on which of your nerves is damaged. Common symptoms include: Loss of feeling (numbness) in the feet, hands, or both. Tingling in the feet, hands, or both. Burning pain. Very sensitive skin. Weakness. Not being able to move a part of the body (paralysis). Muscle twitching. Clumsiness or poor coordination. Loss of balance. Not being able to control your bladder. Feeling dizzy. Sexual  problems. How is this diagnosed? Diagnosing and finding the cause of peripheral neuropathy can be difficult. Your health care provider will take your medical history and do a physical exam. A neurological exam will also be done. This involves checking things that are affected by your brain, spinal cord, and nerves (nervous system). For example, your health care provider will check your reflexes, how you move, and what you can feel. You may have other tests, such as: Blood tests. Electromyogram (EMG) and nerve conduction tests. These tests check nerve function and how well the nerves are controlling the muscles. Imaging tests, such as CT scans or MRI to rule out other causes of your symptoms. Removing a small piece of nerve to be examined in a lab (nerve biopsy). Removing and examining a small amount of the fluid that surrounds the brain and spinal cord (lumbar puncture). How is this treated? Treatment for this condition may involve: Treating the underlying cause of the neuropathy, such as diabetes, kidney disease, or vitamin deficiencies. Stopping medicines that can cause neuropathy, such as chemotherapy. Medicine to help relieve pain. Medicines may include: Prescription or over-the-counter pain medicine. Antiseizure medicine. Antidepressants. Pain-relieving patches that are applied to painful areas of skin. Surgery to relieve pressure on a nerve or to destroy a nerve that is causing pain. Physical therapy to help improve movement and balance. Devices to help you move around (assistive devices). Follow these instructions at home: Medicines Take over-the-counter and prescription medicines only as told by your health care provider. Do not take any other medicines without first asking your health care provider. Do not drive or use heavy machinery while taking prescription pain medicine.  Lifestyle  Do not use any products that contain nicotine or tobacco, such as cigarettes and e-cigarettes.  Smoking keeps blood from reaching damaged nerves. If you need help quitting, ask your health care provider. Avoid or limit alcohol. Too much alcohol can cause a vitamin B deficiency, and vitamin B is needed for healthy nerves. Eat a healthy diet. This includes: Eating foods that are high in fiber, such as fresh fruits and vegetables, whole grains, and beans. Limiting foods that are high in fat and processed sugars, such as fried or sweet foods. General instructions  If you have diabetes, work closely with your health care provider to keep your blood sugar under control. If you have numbness in your feet: Check every day for signs of injury or infection. Watch for redness, warmth, and swelling. Wear padded socks and comfortable shoes. These help protect your feet. Develop a good support system. Living with peripheral neuropathy can be stressful. Consider talking with a mental health specialist or joining a support group. Use assistive devices and attend physical therapy as told by your health care provider. This may include using a walker or a cane. Keep all follow-up visits as told by your health care provider. This is important. Contact a health care provider if: You have new signs or symptoms of peripheral neuropathy. You are struggling emotionally from dealing with peripheral neuropathy. Your pain is not well-controlled. Get help right away if: You have an injury or infection that is not healing normally. You develop new weakness in an arm or leg. You have fallen or do so frequently. Summary Peripheral neuropathy is when the nerves in the arms, or legs are damaged, resulting in numbness, weakness, or pain. There are many causes of peripheral neuropathy, including diabetes, pinched nerves, vitamin deficiencies, autoimmune disease, and hereditary conditions. Diagnosing and finding the cause of peripheral neuropathy can be difficult. Your health care provider will take your medical history,  do a physical exam, and do tests, including blood tests and nerve function tests. Treatment involves treating the underlying cause of the neuropathy and taking medicines to help control pain. Physical therapy and assistive devices may also help. This information is not intended to replace advice given to you by your health care provider. Make sure you discuss any questions you have with your health care provider. Document Revised: 05/29/2020 Document Reviewed: 05/29/2020 Elsevier Patient Education  2022 [employer].  Neuropathic Pain Neuropathic pain is pain caused by damage to the nerves that are responsible for certain sensations in your body (sensory nerves). Neuropathic pain can make you more sensitive to pain. Even a minor sensation can feel very painful. This is usually a long-term (chronic) condition that can be difficult to treat. The type of pain differs from person to person. It may: Start suddenly (acute), or it may develop slowly and become chronic. Come and go as damaged nerves heal, or it may stay at the same level for years. Cause emotional distress, loss of sleep, and a lower quality of life. What are the causes? The most common cause of this condition is diabetes. Many other diseases and conditions can also cause neuropathic pain. Causes of neuropathic pain can be classified as: Toxic. This is caused by medicines and chemicals. The most common causes of toxic neuropathic pain is damage from medicines that kill cancer cells (chemotherapy) or alcohol abuse. Metabolic. This can be caused by: Diabetes. Lack of vitamins like B12. Traumatic. Any injury that cuts, crushes, or stretches a nerve can cause damage and  pain. Compression-related. If a sensory nerve gets trapped or compressed for a long period of time, the blood supply to the nerve can be cut off. Vascular. Many blood vessel diseases can cause neuropathic pain by decreasing blood supply and oxygen to nerves. Autoimmune.  This type of pain results from diseases in which the body's defense system (immune system) mistakenly attacks sensory nerves. Examples of autoimmune diseases that can cause neuropathic pain include lupus and multiple sclerosis. Infectious. Many types of viral infections can damage sensory nerves and cause pain. Shingles infection is a common cause of this type of pain. Inherited. Neuropathic pain can be a symptom of many diseases that are passed down through families (genetic). What increases the risk? You are more likely to develop this condition if: You have diabetes. You smoke. You drink too much alcohol. You are taking certain medicines, including chemotherapy or medicines that treat immune system disorders. What are the signs or symptoms? The main symptom is pain. Neuropathic pain is often described as: Burning. Shock-like. Stinging. Hot or cold. Itching. How is this diagnosed? No single test can diagnose neuropathic pain. It is diagnosed based on: A physical exam and your symptoms. Your health care provider will ask you about your pain. You may be asked to use a pain scale to describe how bad your pain is. Tests. These may be done to see if you have a cause and location of any nerve damage. They include: Nerve conduction studies and electromyography to test how well nerve signals travel through your nerves and muscles (electrodiagnostic testing). Skin biopsy to evaluate for small fiber neuropathy. Imaging studies, such as: X-rays. CT scan. MRI. How is this treated? Treatment for neuropathic pain may change over time. You may need to try different treatment options or a combination of treatments. Some options include: Treating the underlying cause of the neuropathy, such as diabetes, kidney disease, or vitamin deficiencies. Stopping medicines that can cause neuropathy, such as chemotherapy. Medicine to relieve pain. Medicines may include: Prescription or over-the-counter pain  medicine. Anti-seizure medicine. Antidepressant medicines. Pain-relieving patches or creams that are applied to painful areas of skin. A medicine to numb the area (local anesthetic), which can be injected as a nerve block. Transcutaneous nerve stimulation. This uses electrical currents to block painful nerve signals. The treatment is painless. Alternative treatments, such as: Acupuncture. Meditation. Massage. Occupational or physical therapy. Pain management programs. Counseling. Follow these instructions at home: Medicines  Take over-the-counter and prescription medicines only as told by your health care provider. Ask your health care provider if the medicine prescribed to you: Requires you to avoid driving or using machinery. Can cause constipation. You may need to take these actions to prevent or treat constipation: Drink enough fluid to keep your urine pale yellow. Take over-the-counter or prescription medicines. Eat foods that are high in fiber, such as beans, whole grains, and fresh fruits and vegetables. Limit foods that are high in fat and processed sugars, such as fried or sweet foods. Lifestyle  Have a good support system at home. Consider joining a chronic pain support group. Do not use any products that contain nicotine or tobacco. These products include cigarettes, chewing tobacco, and vaping devices, such as e-cigarettes. If you need help quitting, ask your health care provider. Do not drink alcohol. General instructions Learn as much as you can about your condition. Work closely with all your health care providers to find the treatment plan that works best for you. Ask your health care provider what activities  are safe for you. Keep all follow-up visits. This is important. Contact a health care provider if: Your pain treatments are not working. You are having side effects from your medicines. You are struggling with tiredness (fatigue), mood changes, depression,  or anxiety. Get help right away if: You have thoughts of hurting yourself. Get help right away if you feel like you may hurt yourself or others, or have thoughts about taking your own life. Go to your nearest emergency room or: Call 911. Call the [place] at [phone] or 988. This is open 24 hours a day. Text the Crisis Text Line at (719)221-8508. Summary Neuropathic pain is pain caused by damage to the nerves that are responsible for certain sensations in your body (sensory nerves). Neuropathic pain may come and go as damaged nerves heal, or it may stay at the same level for years. Neuropathic pain is usually a long-term condition that can be difficult to treat. Consider joining a chronic pain support group. This information is not intended to replace advice given to you by your health care provider. Make sure you discuss any questions you have with your health care provider. Document Revised: 04/15/2021 Document Reviewed: 04/15/2021 Elsevier Patient Education  2022 [employer].

## 2021-08-22 ENCOUNTER — Encounter: Payer: Self-pay | Admitting: Internal Medicine

## 2021-08-23 ENCOUNTER — Other Ambulatory Visit: Payer: Self-pay

## 2021-08-23 ENCOUNTER — Encounter: Payer: Self-pay | Admitting: Internal Medicine

## 2021-08-23 ENCOUNTER — Telehealth (INDEPENDENT_AMBULATORY_CARE_PROVIDER_SITE_OTHER): Payer: BC Managed Care – PPO | Admitting: Internal Medicine

## 2021-08-23 VITALS — BP 127/70 | Ht 63.0 in | Wt 243.0 lb

## 2021-08-23 DIAGNOSIS — J4520 Mild intermittent asthma, uncomplicated: Secondary | ICD-10-CM

## 2021-08-23 NOTE — Telephone Encounter (Signed)
Scheduled and set up for 8 am virtual visit

## 2021-08-23 NOTE — Progress Notes (Signed)
Telephone Note  I connected with Garald Braver  on 08/23/21 at  8:15 AM EST by telephone and verified that I am speaking with the correct person using two identifiers.  Location patient: home, [place] Location provider:work or home office Persons participating in the virtual visit: patient, provider  I discussed the limitations of evaluation and management by telemedicine and the availability of in person appointments. The patient expressed understanding and agreed to proceed.   HPI:  Acute telemedicine visit for : H/o asthma with dry cough at night x 4 days will test for covid 19 and let me know overall feeling well no fever, sob, wheezing had cts surgery 08/13/21 and was on Abx ? Name started with a C and completed it  She takes prn zyrtec and has prn albuterol inhaler   -COVID-19 vaccine status:pfizer 3/3  ROS: See pertinent positives and negatives per HPI.  Past Medical History:  Diagnosis Date   Arthritis    Carpal boss, right    Carpal tunnel syndrome, bilateral    Headache    Hives 09/05/2020   Hypertension    Seasonal asthma 11/29/2018    Past Surgical History:  Procedure Laterality Date   ABDOMINAL HYSTERECTOMY  10/17/2020   [facility]   ANTERIOR CERVICAL DECOMP/DISCECTOMY FUSION N/A 07/01/2017   Procedure: ANTERIOR CERVICAL DECOMPRESSION/DISCECTOMY FUSION 3 LEVELS;  Surgeon: Meade Maw, MD;  Location: [facility] ORS;  Service: Neurosurgery;  Laterality: N/A;   KNEE ARTHROSCOPY Right      Current Outpatient Medications:    albuterol (PROVENTIL HFA;VENTOLIN HFA) 108 (90 Base) MCG/ACT inhaler, Inhale 2 puffs into the lungs every 6 (six) hours as needed for wheezing or shortness of breath., Disp: 1 Inhaler, Rfl: 5   amLODipine (NORVASC) 5 MG tablet, Take 1 tablet (5 mg total) by mouth daily., Disp: 90 tablet, Rfl: 3   atorvastatin (LIPITOR) 10 MG tablet, TAKE 1 TABLET BY MOUTH DAILY AT 6PM, Disp: 90 tablet, Rfl: 3   buprenorphine (BUTRANS) 7.5 MCG/HR, Place 1 patch onto the  skin once a week., Disp: 4 patch, Rfl: 2   Cetirizine HCl (ZYRTEC PO), Take 25 mg by mouth., Disp: , Rfl:    Cholecalciferol (VITAMIN D-3) 125 MCG (5000 UT) TABS, Take by mouth daily., Disp: , Rfl:    clobetasol cream (TEMOVATE) 1.01 %, Apply 1 application topically 2 (two) times daily. Prn, Disp: 60 g, Rfl: 2   Cyanocobalamin (B-12 PO), Take by mouth., Disp: , Rfl:    diphenhydrAMINE (BENADRYL) 25 mg capsule, Take 25 mg by mouth every 6 (six) hours as needed (for allergies.)., Disp: , Rfl:    DULoxetine (CYMBALTA) 20 MG capsule, Take 1 capsule (20 mg total) by mouth daily., Disp: 90 capsule, Rfl: 3   Elastic Bandages & Supports (LUMBAR BACK BRACE/SUPPORT PAD) MISC, 1 lumbar back brace for lumbar facet pain, Disp: 1 each, Rfl: 0   EPINEPHrine (AUVI-Q) 0.3 mg/0.3 mL IJ SOAJ injection, Use as directed for severe allergic reaction, Disp: 2 Device, Rfl: 2   fluticasone (FLONASE) 50 MCG/ACT nasal spray, Place 2 sprays into both nostrils daily., Disp: 16 g, Rfl: 6   furosemide (LASIX) 20 MG tablet, Take 1 tablet (20 mg total) by mouth daily as needed. X 5-7 days, Disp: 30 tablet, Rfl: 11   hydrochlorothiazide (HYDRODIURIL) 25 MG tablet, TAKE 1/2 TABLET BY MOUTH EVERY MORNING AS DIRECTED, Disp: 90 tablet, Rfl: 3   hydrocortisone 2.5 % cream, Apply topically 2 (two) times daily. Prn to eyelids, Disp: 30 g, Rfl: 0  ibuprofen (ADVIL) 600 MG tablet, IBU 600 mg tablet, Disp: , Rfl:    losartan (COZAAR) 100 MG tablet, Take 1 tablet (100 mg total) by mouth daily., Disp: 90 tablet, Rfl: 3   meloxicam (MOBIC) 15 MG tablet, Take 1 tablet (15 mg total) by mouth every other day as needed., Disp: 45 tablet, Rfl: 2   methocarbamol (ROBAXIN) 750 MG tablet, Take 1 tablet (750 mg total) by mouth every 8 (eight) hours as needed for muscle spasms., Disp: 90 tablet, Rfl: 11   montelukast (SINGULAIR) 10 MG tablet, Take 1 tablet (10 mg total) by mouth at bedtime., Disp: 90 tablet, Rfl: 3   Olopatadine HCl 0.2 % SOLN, Apply 1  drop to eye daily. Both eyes, Disp: 2.5 mL, Rfl: 11   pantoprazole (PROTONIX) 40 MG tablet, TAKE ONE TABLET EVERY DAY 30 MIN BEFORE DINNER, Disp: 90 tablet, Rfl: 3   potassium chloride SA (KLOR-CON M) 20 MEQ tablet, Take 1 tablet (20 mEq total) by mouth daily as needed. With lasix, Disp: 30 tablet, Rfl: 11   pregabalin (LYRICA) 75 MG capsule, Take 1 capsule (75 mg total) by mouth 3 (three) times daily., Disp: 90 capsule, Rfl: 11  EXAM:  VITALS per patient if applicable:  GENERAL: alert, oriented, appears well and in no acute distress   PSYCH/NEURO: pleasant and cooperative, no obvious depression or anxiety, speech and thought processing grossly intact  ASSESSMENT AND PLAN:  Discussed the following assessment and plan:  Mild intermittent asthma, unspecified whether complicated Dry cough at night could be presentation of asthma  Prn albuterol consider add maintenancy inhaler if needed in the future  Prn zyrtec, singulair  Otc cough drops and cough syrup robitussin dm or mucinex dm   Will take at home covid test and let me know Results today   -we discussed possible serious and likely etiologies, options for evaluation and workup, limitations of telemedicine visit vs in person visit, treatment, treatment risks and precautions. Pt is agreeable to treatment via telemedicine at this moment.   I discussed the assessment and treatment plan with the patient. The patient was provided an opportunity to ask questions and all were answered. The patient agreed with the plan and demonstrated an understanding of the instructions.    Time 5 min Delorise Jackson, MD

## 2021-08-29 ENCOUNTER — Telehealth: Payer: Self-pay | Admitting: Internal Medicine

## 2021-08-29 ENCOUNTER — Other Ambulatory Visit (INDEPENDENT_AMBULATORY_CARE_PROVIDER_SITE_OTHER): Payer: BC Managed Care – PPO

## 2021-08-29 ENCOUNTER — Other Ambulatory Visit: Payer: Self-pay

## 2021-08-29 ENCOUNTER — Other Ambulatory Visit: Payer: Self-pay | Admitting: Internal Medicine

## 2021-08-29 DIAGNOSIS — R059 Cough, unspecified: Secondary | ICD-10-CM

## 2021-08-29 DIAGNOSIS — I10 Essential (primary) hypertension: Secondary | ICD-10-CM | POA: Diagnosis not present

## 2021-08-29 DIAGNOSIS — E785 Hyperlipidemia, unspecified: Secondary | ICD-10-CM

## 2021-08-29 DIAGNOSIS — R7303 Prediabetes: Secondary | ICD-10-CM

## 2021-08-29 LAB — CBC WITH DIFFERENTIAL/PLATELET
Basophils Absolute: 0 10*3/uL (ref 0.0–0.1)
Basophils Relative: 0.4 % (ref 0.0–3.0)
Eosinophils Absolute: 0.1 10*3/uL (ref 0.0–0.7)
Eosinophils Relative: 1.1 % (ref 0.0–5.0)
HCT: 35.1 % — ABNORMAL LOW (ref 36.0–46.0)
Hemoglobin: 11.5 g/dL — ABNORMAL LOW (ref 12.0–15.0)
Lymphocytes Relative: 40.2 % (ref 12.0–46.0)
Lymphs Abs: 2.2 10*3/uL (ref 0.7–4.0)
MCHC: 32.7 g/dL (ref 30.0–36.0)
MCV: 98.5 fl (ref 78.0–100.0)
Monocytes Absolute: 0.5 10*3/uL (ref 0.1–1.0)
Monocytes Relative: 8.2 % (ref 3.0–12.0)
Neutro Abs: 2.8 10*3/uL (ref 1.4–7.7)
Neutrophils Relative %: 50.1 % (ref 43.0–77.0)
Platelets: 241 10*3/uL (ref 150.0–400.0)
RBC: 3.57 Mil/uL — ABNORMAL LOW (ref 3.87–5.11)
RDW: 13.9 % (ref 11.5–15.5)
WBC: 5.5 10*3/uL (ref 4.0–10.5)

## 2021-08-29 LAB — LIPID PANEL
Cholesterol: 171 mg/dL (ref 0–200)
HDL: 51.4 mg/dL (ref >39.00)
LDL Cholesterol: 108 mg/dL — ABNORMAL HIGH (ref 0–99)
NonHDL: 119.97
Total CHOL/HDL Ratio: 3
Triglycerides: 58 mg/dL (ref 0.0–149.0)
VLDL: 11.6 mg/dL (ref 0.0–40.0)

## 2021-08-29 LAB — COMPREHENSIVE METABOLIC PANEL
ALT: 14 U/L (ref 0–35)
AST: 14 U/L (ref 0–37)
Albumin: 3.9 g/dL (ref 3.5–5.2)
Alkaline Phosphatase: 66 U/L (ref 39–117)
BUN: 13 mg/dL (ref 6–23)
CO2: 28 mEq/L (ref 19–32)
Calcium: 9.1 mg/dL (ref 8.4–10.5)
Chloride: 103 mEq/L (ref 96–112)
Creatinine, Ser: 0.92 mg/dL (ref 0.40–1.20)
GFR: 73.82 mL/min (ref >60.00)
Glucose, Bld: 122 mg/dL — ABNORMAL HIGH (ref 70–99)
Potassium: 3.9 mEq/L (ref 3.5–5.1)
Sodium: 137 mEq/L (ref 135–145)
Total Bilirubin: 0.7 mg/dL (ref 0.2–1.2)
Total Protein: 6.9 g/dL (ref 6.0–8.3)

## 2021-08-29 LAB — HEMOGLOBIN A1C: Hgb A1c MFr Bld: 6.1 % (ref 4.6–6.5)

## 2021-08-29 MED ORDER — ALBUTEROL SULFATE HFA 108 (90 BASE) MCG/ACT IN AERS: 2.0000 | INHALATION_SPRAY | Freq: Four times a day (QID) | RESPIRATORY_TRACT | 11 refills | Status: DC | PRN

## 2021-08-29 NOTE — Telephone Encounter (Signed)
Pt need a refill on albuterol inhaler

## 2021-08-29 NOTE — Telephone Encounter (Signed)
Last sent in 2020 by another provider. Okay to send in?

## 2021-08-29 NOTE — Telephone Encounter (Signed)
LM that albuterol was sent in & to call back if any questions.

## 2021-08-29 NOTE — Telephone Encounter (Signed)
Sent albuterol

## 2021-09-04 ENCOUNTER — Ambulatory Visit: Payer: BC Managed Care – PPO | Admitting: Dietician

## 2021-09-30 ENCOUNTER — Encounter: Payer: Self-pay | Admitting: Dietician

## 2021-09-30 NOTE — Progress Notes (Signed)
Have not heard back from patient to reschedule her cancelled appointment from 09/04/21. Sent notification to referring provider.

## 2021-10-01 ENCOUNTER — Other Ambulatory Visit: Payer: Self-pay

## 2021-10-01 ENCOUNTER — Ambulatory Visit
Payer: BC Managed Care – PPO | Attending: Student in an Organized Health Care Education/Training Program | Admitting: Student in an Organized Health Care Education/Training Program

## 2021-10-01 ENCOUNTER — Encounter: Payer: Self-pay | Admitting: Student in an Organized Health Care Education/Training Program

## 2021-10-01 VITALS — BP 124/64 | HR 72 | Temp 97.3°F | Resp 15 | Ht 63.0 in | Wt 245.0 lb

## 2021-10-01 DIAGNOSIS — M5412 Radiculopathy, cervical region: Secondary | ICD-10-CM | POA: Insufficient documentation

## 2021-10-01 DIAGNOSIS — M47812 Spondylosis without myelopathy or radiculopathy, cervical region: Secondary | ICD-10-CM | POA: Diagnosis present

## 2021-10-01 DIAGNOSIS — G8929 Other chronic pain: Secondary | ICD-10-CM | POA: Diagnosis present

## 2021-10-01 DIAGNOSIS — G894 Chronic pain syndrome: Secondary | ICD-10-CM | POA: Insufficient documentation

## 2021-10-01 DIAGNOSIS — M542 Cervicalgia: Secondary | ICD-10-CM | POA: Diagnosis present

## 2021-10-01 DIAGNOSIS — M47816 Spondylosis without myelopathy or radiculopathy, lumbar region: Secondary | ICD-10-CM | POA: Diagnosis present

## 2021-10-01 DIAGNOSIS — M7918 Myalgia, other site: Secondary | ICD-10-CM | POA: Diagnosis present

## 2021-10-01 DIAGNOSIS — M5416 Radiculopathy, lumbar region: Secondary | ICD-10-CM | POA: Diagnosis present

## 2021-10-01 DIAGNOSIS — M792 Neuralgia and neuritis, unspecified: Secondary | ICD-10-CM | POA: Diagnosis present

## 2021-10-01 MED ORDER — METHOCARBAMOL 750 MG PO TABS: 750.0000 mg | ORAL_TABLET | Freq: Three times a day (TID) | ORAL | 11 refills | Status: DC | PRN

## 2021-10-01 MED ORDER — BUPRENORPHINE 7.5 MCG/HR TD PTWK: 1.0000 | MEDICATED_PATCH | TRANSDERMAL | 2 refills | Status: DC

## 2021-10-01 MED ORDER — PREGABALIN 75 MG PO CAPS: 75.0000 mg | ORAL_CAPSULE | Freq: Three times a day (TID) | ORAL | 11 refills | Status: DC

## 2021-10-01 NOTE — Progress Notes (Signed)
Nursing Pain Medication Assessment:  Safety precautions to be maintained throughout the outpatient stay will include: orient to surroundings, keep bed in low position, maintain call bell within reach at all times, provide assistance with transfer out of bed and ambulation.  Medication Inspection Compliance: Pill count conducted under aseptic conditions, in front of the patient. Neither the pills nor the bottle was removed from the patient's sight at any time. Once count was completed pills were immediately returned to the patient in their original bottle.  Medication: Buprenorphine (Suboxone) Pill/Patch Count:  01 of 04 pills remain Pill/Patch Appearance: Markings consistent with prescribed medication Bottle Appearance: Standard pharmacy container. Clearly labeled. Filled Date: 01 / 12 / 2023 Last Medication intake:  Day before yesterday

## 2021-10-01 NOTE — Progress Notes (Signed)
PROVIDER NOTE: Information contained herein reflects review and annotations entered in association with encounter. Interpretation of such information and data should be left to medically-trained personnel. Information provided to patient can be located elsewhere in the medical record under "Patient Instructions". Document created using STT-dictation technology, any transcriptional errors that may result from process are unintentional.  °  °Patient: Anita Gibbs  Service Category: E/M  Provider: Bilal Lateef, MD  °DOB: 03/14/1971  DOS: 10/01/2021  Specialty: Interventional Pain Management  °MRN: [id number]  Setting: Ambulatory outpatient  PCP: McLean-Scocuzza, Tracy N, MD  °Type: Established Patient    Referring Provider: McLean-Scocuzza, Tracy *  °Location: Office  Delivery: Face-to-face    ° °HPI  °[name], a 50 y.o. year old female, is here today because of her Right lumbar radiculitis [M54.16]. Anita Gibbs's primary complain today is Back Pain (lower) ° °Last encounter: [name] was on 04/25/2021. °Pertinent problems: Anita Gibbs has Lumbar facet syndrome (Bilateral) (R>L); Spondylosis without myelopathy or radiculopathy, lumbosacral region; and Morbid obesity with BMI of 45.0-49.9, adult (HCC) on their pertinent problem list. °Pain Assessment: Severity of Chronic pain is reported as a 1 /10. Location: Back Lower/down right leg to toes. Onset: More than a month ago. Quality: Dull. Timing: Intermittent. Modifying factor(s): meds. °Vitals:  height is 5' 3" (1.6 m) and weight is 245 lb (111.1 kg). Her temporal temperature is 97.3 °F (36.3 °C) (abnormal). Her blood pressure is 124/64 and her pulse is 72. Her respiration is 15 and oxygen saturation is 99%.  ° °Reason for encounter: medication management.   ° °Patient presents today for medication management.  She is endorsing analgesic and functional benefit with Butrans patch at 7.5 mcg an hour.  She states that she is more functional,  has significantly less pain and has better quality of life.  °Refill Lyrica and Robaxin as below ° ° °Pharmacotherapy Assessment  °Analgesic: Butrans 7.5 mcg an hour ° °Monitoring: °[place] PMP: PDMP not reviewed this encounter.       °Pharmacotherapy: No side-effects or adverse reactions reported. °Compliance: No problems identified. °Effectiveness: Clinically acceptable. ° °Welborn, Susan, RN  10/01/2021  8:33 AM  Sign when Signing Visit °Nursing Pain Medication Assessment:  °Safety precautions to be maintained throughout the outpatient stay will include: orient to surroundings, keep bed in low position, maintain call bell within reach at all times, provide assistance with transfer out of bed and ambulation.  °Medication Inspection Compliance: Pill count conducted under aseptic conditions, in front of the patient. Neither the pills nor the bottle was removed from the patient's sight at any time. Once count was completed pills were immediately returned to the patient in their original bottle. ° °Medication: Buprenorphine (Suboxone) °Pill/Patch Count:  01 of 04 pills remain °Pill/Patch Appearance: Markings consistent with prescribed medication °Bottle Appearance: Standard pharmacy container. Clearly labeled. °Filled Date: 01 / 12 / 2023 °Last Medication intake:  Day before yesterday  UDS:  °Summary  °Date Value Ref Range Status  °02/01/2020 Note  Final  °  Comment:  °  ==================================================================== °[employer] Select 13 (MW) °==================================================================== °Test                             Result       Flag       Units °Drug Present not Declared for Prescription Verification °  Carboxy-THC                      20           UNEXPECTED ng/mg creat    Carboxy-THC is a metabolite of tetrahydrocannabinol (THC). Source of    THC is most commonly herbal marijuana or marijuana-based products,    but THC is also present in a scheduled prescription  medication.    Trace amounts of THC can be present in hemp and cannabidiol (CBD)    products. This test is not intended to distinguish between delta-9-    tetrahydrocannabinol, the predominant form of THC in most herbal or    marijuana-based products, and delta-8-tetrahydrocannabinol. Drug Absent but Declared for Prescription Verification   Oxycodone                      Not Detected UNEXPECTED ng/mg creat ==================================================================== Test                      Result    Flag   Units      Ref Range   Creatinine              230              mg/dL      >=20 ==================================================================== Declared Medications:  The flagging and interpretation on this report are based on the  following declared medications.  Unexpected results may arise from  inaccuracies in the declared medications.  **Note: The testing scope of this panel includes these medications:  Oxycodone (Roxicodone)  **Note: The testing scope of this panel does not include the  following reported medications:  Albuterol (Ventolin HFA)  Amlodipine (Norvasc)  Atorvastatin (Lipitor)  Cetirizine  Diphenhydramine (Benadryl)  Epinephrine (EpiPen)  Fluticasone (Flonase)  Gabapentin (Neurontin)  Hydrochlorothiazide  Losartan (Cozaar)  Meloxicam (Mobic)  Methocarbamol (Robaxin)  Montelukast (Singulair)  Norethindrone (Aygestin)  Pantoprazole (Protonix)  Vitamin D3 ==================================================================== For clinical consultation, please call [phone]. ====================================================================      ROS  Constitutional: Denies any fever or chills Gastrointestinal: No reported hemesis, hematochezia, vomiting, or acute GI distress Musculoskeletal:  Right low back pain  Neurological: No reported episodes of acute onset apraxia, aphasia, dysarthria, agnosia, amnesia, paralysis, loss of  coordination, or loss of consciousness  Medication Review  Cetirizine HCl, Cyanocobalamin, DULoxetine, EPINEPHrine, Lumbar Back Brace/Support Pad, Olopatadine HCl, Vitamin D-3, albuterol, amLODipine, atorvastatin, buprenorphine, clobetasol cream, diphenhydrAMINE, fluticasone, furosemide, hydrochlorothiazide, hydrocortisone, ibuprofen, losartan, meloxicam, methocarbamol, montelukast, pantoprazole, potassium chloride SA, and pregabalin  History Review  Allergy: Anita Gibbs is allergic to other, pineapple, shellfish allergy, strawberry (diagnostic), and tomato. Drug: Anita Gibbs  reports no history of drug use. Alcohol:  reports current alcohol use of about 3.0 standard drinks per week. Tobacco:  reports that she has never smoked. She has never used smokeless tobacco. Social: Anita Gibbs  reports that she has never smoked. She has never used smokeless tobacco. She reports current alcohol use of about 3.0 standard drinks per week. She reports that she does not use drugs. Medical:  has a past medical history of Arthritis, Carpal boss, right, Carpal tunnel syndrome, bilateral, Headache, Hives (09/05/2020), Hypertension, and Seasonal asthma (11/29/2018). Surgical: Anita Gibbs  has a past surgical history that includes Knee arthroscopy (Right); Anterior cervical decomp/discectomy fusion (N/A, 07/01/2017); Abdominal hysterectomy (10/17/2020); and Carpal tunnel release (Bilateral). Family: family history includes [name] in her mother; Arthritis in her father, mother, and sister; Breast cancer in her maternal aunt and paternal aunt; COPD in her brother and son; Cancer in her brother; Depression in her son; Diabetes  in her mother; Early death in her brother; Hearing loss in her father; Hyperlipidemia in her mother; Hypertension in her father, mother, and sister; Kidney disease in her father; Lymphoma in her father; Sleep apnea in her daughter.  Laboratory Chemistry Profile   Renal Lab Results  Component Value  Date   BUN 13 08/29/2021   CREATININE 0.92 19/37/9024   BCR NOT APPLICABLE 09/73/5329   GFR 73.82 08/29/2021   GFRAA 76 09/07/2018   GFRNONAA 66 09/07/2018    Hepatic Lab Results  Component Value Date   AST 14 08/29/2021   ALT 14 08/29/2021   ALBUMIN 3.9 08/29/2021   ALKPHOS 66 08/29/2021    Electrolytes Lab Results  Component Value Date   NA 137 08/29/2021   K 3.9 08/29/2021   CL 103 08/29/2021   CALCIUM 9.1 08/29/2021   MG 2.2 09/07/2018    Bone Lab Results  Component Value Date   VD25OH 54.97 01/24/2021   25OHVITD1 8.1 (L) 09/07/2018   25OHVITD2 <1.0 09/07/2018   25OHVITD3 8.1 09/07/2018    Inflammation (CRP: Acute Phase) (ESR: Chronic Phase) Lab Results  Component Value Date   CRP <1.0 01/24/2021   ESRSEDRATE 37 (H) 01/24/2021         Note: Above Lab results reviewed.  Physical Exam  General appearance: Well nourished, well developed, and well hydrated. In no apparent acute distress Mental status: Alert, oriented x 3 (person, place, & time)       Respiratory: No evidence of acute respiratory distress Eyes: PERLA Vitals: BP 124/64    Pulse 72    Temp (!) 97.3 F (36.3 C) (Temporal)    Resp 15    Ht 5' 3" (1.6 m)    Wt 245 lb (111.1 kg)    LMP  (LMP Unknown)    SpO2 99%    BMI 43.40 kg/m  BMI: Estimated body mass index is 43.4 kg/m as calculated from the following:   Height as of this encounter: 5' 3" (1.6 m).   Weight as of this encounter: 245 lb (111.1 kg). Ideal: Ideal body weight: 52.4 kg (115 lb 8.3 oz) Adjusted ideal body weight: 75.9 kg (167 lb 5 oz)  Lumbar Spine Area Exam  Skin & Axial Inspection: No masses, redness, or swelling Alignment: Symmetrical Functional ROM: Unrestricted ROM       Stability: No instability detected Muscle Tone/Strength: Functionally intact. No obvious neuro-muscular anomalies detected. Sensory (Neurological): Musculoskeletal pain pattern   Lower Extremity Exam      Side: Right lower extremity   Side: Left lower  extremity  Stability: No instability observed           Stability: No instability observed          Skin & Extremity Inspection: Skin color, temperature, and hair growth are WNL. No peripheral edema or cyanosis. No masses, redness, swelling, asymmetry, or associated skin lesions. No contractures.   Skin & Extremity Inspection: Skin color, temperature, and hair growth are WNL. No peripheral edema or cyanosis. No masses, redness, swelling, asymmetry, or associated skin lesions. No contractures.  Functional ROM: Unrestricted ROM                   Functional ROM: Unrestricted ROM                  Muscle Tone/Strength: Functionally intact. No obvious neuro-muscular anomalies detected.   Muscle Tone/Strength: Functionally intact. No obvious neuro-muscular anomalies detected.  Sensory (Neurological): Unimpaired  Sensory (Neurological): Unimpaired        DTR: Patellar: deferred today Achilles: deferred today Plantar: deferred today   DTR: Patellar: deferred today Achilles: deferred today Plantar: deferred today  Palpation: No palpable anomalies   Palpation: No palpable anomalies    Assessment   Status Diagnosis  Controlled Controlled Controlled 1. Lumbar radiculitis (L5 dermatome)    2. Lumbar facet arthropathy   3. Cervicalgia (Primary Area of Pain) (Bilateral) (L>R)   4. Spondylosis without myelopathy or radiculopathy, cervical region   5. Cervical radicular pain   6. Chronic musculoskeletal pain   7. Neurogenic pain   8. Chronic pain syndrome       Plan of Care    Anita Gibbs has a current medication list which includes the following long-term medication(s): albuterol, amlodipine, atorvastatin, cetirizine hcl, diphenhydramine, duloxetine, fluticasone, furosemide, hydrochlorothiazide, losartan, montelukast, pantoprazole, potassium chloride sa, methocarbamol, and pregabalin.  Pharmacotherapy (Medications Ordered): Meds ordered this encounter  Medications    buprenorphine (BUTRANS) 7.5 MCG/HR    Sig: Place 1 patch onto the skin once a week.    Dispense:  4 patch    Refill:  2    Chronic Pain: STOP Act (Not applicable) Fill 1 day early if closed on refill date. Avoid benzodiazepines within 8 hours of opioids   DISCONTD: pregabalin (LYRICA) 75 MG capsule    Sig: Take 1 capsule (75 mg total) by mouth 3 (three) times daily.    Dispense:  90 capsule    Refill:  11    Fill one day early if pharmacy is closed on scheduled refill date. May substitute for generic if available.   DISCONTD: methocarbamol (ROBAXIN) 750 MG tablet    Sig: Take 1 tablet (750 mg total) by mouth every 8 (eight) hours as needed for muscle spasms.    Dispense:  90 tablet    Refill:  11    Fill one day early if pharmacy is closed on scheduled refill date. May substitute for generic if available.   pregabalin (LYRICA) 75 MG capsule    Sig: Take 1 capsule (75 mg total) by mouth 3 (three) times daily.    Dispense:  90 capsule    Refill:  11    Fill one day early if pharmacy is closed on scheduled refill date. May substitute for generic if available.   methocarbamol (ROBAXIN) 750 MG tablet    Sig: Take 1 tablet (750 mg total) by mouth every 8 (eight) hours as needed for muscle spasms.    Dispense:  90 tablet    Refill:  11    Fill one day early if pharmacy is closed on scheduled refill date. May substitute for generic if available.    Follow-up plan:   Return in about 3 months (around 12/29/2021) for Medication Management, in person.    Recent Visits No visits were found meeting these conditions. Showing recent visits within past 90 days and meeting all other requirements Today's Visits Date Type Provider Dept  10/01/21 Office Visit Gillis Santa, MD [facility]  Showing today's visits and meeting all other requirements Future Appointments Date Type Provider Dept  12/24/21 Appointment Gillis Santa, MD [facility]  Showing future appointments within  next 90 days and meeting all other requirements  I discussed the assessment and treatment plan with the patient. The patient was provided an opportunity to ask questions and all were answered. The patient agreed with the plan and demonstrated an understanding of the instructions.  Patient  advised to call back or seek an in-person evaluation if the symptoms or condition worsens. ° °Duration of encounter: 30minutes. ° °Note by: Bilal Lateef, MD °Date: 10/01/2021; Time: 9:33 AM °

## 2021-10-07 ENCOUNTER — Encounter: Payer: Self-pay | Admitting: Student in an Organized Health Care Education/Training Program

## 2021-10-08 NOTE — Telephone Encounter (Signed)
Anita Gibbs,           I just got off the phone with [name] at [facility] and the scripts were there all along. They are ready for pick up.

## 2021-10-10 ENCOUNTER — Encounter: Payer: Self-pay | Admitting: Internal Medicine

## 2021-10-10 NOTE — Telephone Encounter (Signed)
Patient last seen 08/2021. Please advise on weight loss

## 2021-10-25 ENCOUNTER — Encounter: Payer: Self-pay | Admitting: Student in an Organized Health Care Education/Training Program

## 2021-10-29 ENCOUNTER — Ambulatory Visit: Payer: BC Managed Care – PPO | Admitting: Internal Medicine

## 2021-10-30 ENCOUNTER — Encounter: Payer: Self-pay | Admitting: Internal Medicine

## 2021-10-31 ENCOUNTER — Encounter: Payer: Self-pay | Admitting: Internal Medicine

## 2021-10-31 ENCOUNTER — Other Ambulatory Visit: Payer: Self-pay

## 2021-10-31 ENCOUNTER — Telehealth: Payer: BC Managed Care – PPO | Admitting: Internal Medicine

## 2021-10-31 VITALS — Ht 63.0 in | Wt 248.0 lb

## 2021-10-31 DIAGNOSIS — J4521 Mild intermittent asthma with (acute) exacerbation: Secondary | ICD-10-CM | POA: Diagnosis not present

## 2021-10-31 DIAGNOSIS — J321 Chronic frontal sinusitis: Secondary | ICD-10-CM | POA: Diagnosis not present

## 2021-10-31 MED ORDER — PREDNISONE 20 MG PO TABS: 40.0000 mg | ORAL_TABLET | Freq: Every day | ORAL | 0 refills | Status: DC

## 2021-10-31 MED ORDER — AZITHROMYCIN 250 MG PO TABS: ORAL_TABLET | ORAL | 0 refills | Status: AC

## 2021-10-31 NOTE — Patient Instructions (Signed)
?  These are over the counter medication options:  ?Mucinex dm green label for cough or robitussin DM  ?Multivitamin or below vitamins  ?Vitamin C 1000 mg daily.  ?Vitamin D3 4000 Iu (units) daily.  ?Zinc 100 mg daily.  ?Quercetin 250-500 mg 2 times per day   ?Elderberry  ?Oil of oregano  ?cepacol or chloroseptic spray ?Warm salt water gargles +hydrogen peroxide ?Sugar free cough drops  ?Warm tea with honey and lemon  ?Hydration  ?Try to eat though you dont feel like it   ?Tylenol or Advil  ?Nasal saline and Flonase 2 sprays nasal congestion  ?If sneezing/runny nose over the counter allergy pill claritin,allegra, zyrtec, xyzal ?Quarantine x 10-14 days 14 days preferred  ? ?Monitor pulse oximeter, buy from [employer] if oxygen is less than 90 please go to the hospital.  ?   ?   ?Are you feeling really sick? Shortness of breath, cough, chest pain?, dizziness? Confusion  ? If so let me know  ?If worsening, go to hospital or [facility] Urgent care for further treatment.    ?

## 2021-10-31 NOTE — Progress Notes (Signed)
Telephone Note ? ?I connected with Anita Gibbs ? on 10/31/21 at  8:20 AM EST by telephone and verified that I am speaking with the correct person using two identifiers. ? Location patient: [place] ?Location provider:work or home office ?Persons participating in the virtual visit: patient, provider ? ?I discussed the limitations and requested verbal permission for telemedicine visit. The patient expressed understanding and agreed to proceed. ? ? ?HPI: ? ?Runny nose, body aches, chills cough fatigue, no sore throat but hoarse voice and sinus pressure since Monday 3 am  ?School bus driver wearing mask  ?No fever + wheezing  ?Neg covid flu and rsv ? ?-Pertinent past medical history: see below ?-Pertinent medication allergies: ?Allergies  ?Allergen Reactions  ? Other Hives  ?  White and red sauces  ? Pineapple   ?  Swelling ?  ? Shellfish Allergy Hives  ? Strawberry (Diagnostic) Hives  ? Tomato Hives  ?  cherry  ? ?-COVID-19 vaccine status:  ?Immunization History  ?Administered Date(s) Administered  ? Influenza,inj,Quad PF,6+ Mos 05/31/2018, 08/15/2019, 08/03/2020, 07/31/2021  ? PFIZER(Purple Top)SARS-COV-2 Vaccination 01/24/2020, 02/14/2020, 09/03/2020  ? 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 Tdap 10/13/2019  ? ? ? ?ROS: See pertinent positives and negatives per HPI. ? ?Past Medical History:  ?Diagnosis Date  ? Arthritis   ? Carpal boss, right   ? Carpal tunnel syndrome, bilateral   ? Headache   ? Hives 09/05/2020  ? Hypertension   ? Seasonal asthma 11/29/2018  ? ? ?Past Surgical History:  ?Procedure Laterality Date  ? ABDOMINAL HYSTERECTOMY  10/17/2020  ? [facility]  ? ANTERIOR CERVICAL DECOMP/DISCECTOMY FUSION N/A 07/01/2017  ? Procedure: ANTERIOR CERVICAL DECOMPRESSION/DISCECTOMY FUSION 3 LEVELS;  Surgeon: Meade Maw, MD;  Location: [facility] ORS;  Service: Neurosurgery;  Laterality: N/A;  ? CARPAL TUNNEL RELEASE Bilateral   ? KNEE ARTHROSCOPY Right   ? ? ? ?Current Outpatient Medications:  ?   albuterol (VENTOLIN HFA) 108 (90 Base) MCG/ACT inhaler, Inhale 2 puffs into the lungs every 6 (six) hours as needed for wheezing or shortness of breath., Disp: 1 each, Rfl: 11 ?  amLODipine (NORVASC) 5 MG tablet, Take 1 tablet (5 mg total) by mouth daily., Disp: 90 tablet, Rfl: 3 ?  atorvastatin (LIPITOR) 10 MG tablet, TAKE 1 TABLET BY MOUTH DAILY AT 6PM, Disp: 90 tablet, Rfl: 3 ?  azithromycin (ZITHROMAX) 250 MG tablet, With food Take 2 tablets on day 1, then 1 tablet daily on days 2 through 5, Disp: 6 tablet, Rfl: 0 ?  buprenorphine (BUTRANS) 7.5 MCG/HR, Place 1 patch onto the skin once a week., Disp: 4 patch, Rfl: 2 ?  Cetirizine HCl (ZYRTEC PO), Take 25 mg by mouth., Disp: , Rfl:  ?  Cholecalciferol (VITAMIN D-3) 125 MCG (5000 UT) TABS, Take by mouth daily., Disp: , Rfl:  ?  Cyanocobalamin (B-12 PO), Take by mouth., Disp: , Rfl:  ?  diphenhydrAMINE (BENADRYL) 25 mg capsule, Take 25 mg by mouth every 6 (six) hours as needed (for allergies.)., Disp: , Rfl:  ?  DULoxetine (CYMBALTA) 20 MG capsule, Take 1 capsule (20 mg total) by mouth daily., Disp: 90 capsule, Rfl: 3 ?  Elastic Bandages & Supports (LUMBAR BACK BRACE/SUPPORT PAD) MISC, 1 lumbar back brace for lumbar facet pain, Disp: 1 each, Rfl: 0 ?  EPINEPHrine (AUVI-Q) 0.3 mg/0.3 mL IJ SOAJ injection, Use as directed for severe allergic reaction, Disp: 2 Device, Rfl: 2 ?  fluticasone (FLONASE) 50 MCG/ACT nasal spray, Place 2  sprays into both nostrils daily., Disp: 16 g, Rfl: 6 ?  furosemide (LASIX) 20 MG tablet, Take 1 tablet (20 mg total) by mouth daily as needed. X 5-7 days, Disp: 30 tablet, Rfl: 11 ?  hydrochlorothiazide (HYDRODIURIL) 25 MG tablet, TAKE 1/2 TABLET BY MOUTH EVERY MORNING AS DIRECTED, Disp: 90 tablet, Rfl: 3 ?  hydrocortisone 2.5 % cream, Apply topically 2 (two) times daily. Prn to eyelids, Disp: 30 g, Rfl: 0 ?  losartan (COZAAR) 100 MG tablet, Take 1 tablet (100 mg total) by mouth daily., Disp: 90 tablet, Rfl: 3 ?  meloxicam (MOBIC) 15 MG  tablet, Take 1 tablet (15 mg total) by mouth every other day as needed., Disp: 45 tablet, Rfl: 2 ?  methocarbamol (ROBAXIN) 750 MG tablet, Take 1 tablet (750 mg total) by mouth every 8 (eight) hours as needed for muscle spasms., Disp: 90 tablet, Rfl: 11 ?  montelukast (SINGULAIR) 10 MG tablet, Take 1 tablet (10 mg total) by mouth at bedtime., Disp: 90 tablet, Rfl: 3 ?  Olopatadine HCl 0.2 % SOLN, Apply 1 drop to eye daily. Both eyes, Disp: 2.5 mL, Rfl: 11 ?  pantoprazole (PROTONIX) 40 MG tablet, TAKE ONE TABLET EVERY DAY 30 MIN BEFORE DINNER, Disp: 90 tablet, Rfl: 3 ?  potassium chloride SA (KLOR-CON M) 20 MEQ tablet, Take 1 tablet (20 mEq total) by mouth daily as needed. With lasix, Disp: 30 tablet, Rfl: 11 ?  predniSONE (DELTASONE) 20 MG tablet, Take 2 tablets (40 mg total) by mouth daily with breakfast. X 5-7, Disp: 14 tablet, Rfl: 0 ?  pregabalin (LYRICA) 75 MG capsule, Take 1 capsule (75 mg total) by mouth 3 (three) times daily., Disp: 90 capsule, Rfl: 11 ?  clobetasol cream (TEMOVATE) 5.09 %, Apply 1 application topically 2 (two) times daily. Prn (Patient not taking: Reported on 10/31/2021), Disp: 60 g, Rfl: 2 ?  ibuprofen (ADVIL) 600 MG tablet, IBU 600 mg tablet (Patient not taking: Reported on 10/31/2021), Disp: , Rfl:  ? ?EXAM: ? ?VITALS per patient if applicable: ? ?GENERAL: alert, oriented, appears well and in no acute distress ? ?HEENT: atraumatic, conjunttiva clear, no obvious abnormalities on inspection of external nose and ears ? ?PSYCH/NEURO: pleasant and cooperative, no obvious depression or anxiety, speech and thought processing grossly intact ? ?ASSESSMENT AND PLAN: ? ?Discussed the following assessment and plan: ? ?Frontal sinusitis, unspecified chronicity - Plan: azithromycin (ZITHROMAX) 250 MG tablet, predniSONE (DELTASONE) 20 MG tablet ?Supportive care  ?H/o asthma likely exacerbation prn albuterol as well  ? ?-we discussed possible serious and likely etiologies, options for evaluation and workup,  limitations of telemedicine visit vs in person visit, treatment, treatment risks and precautions. Pt is agreeable to treatment via telemedicine at this moment.  ? ?I discussed the assessment and treatment plan with the patient. The patient was provided an opportunity to ask questions and all were answered. The patient agreed with the plan and demonstrated an understanding of the instructions. ?  ? ?Time spent 20 min ?Nino Glow McLean-Scocuzza, MD  ? ?

## 2021-10-31 NOTE — Telephone Encounter (Signed)
Patient tested negative for RSV, Flu, and Covid  ?

## 2021-11-15 ENCOUNTER — Other Ambulatory Visit: Payer: Self-pay

## 2021-11-15 ENCOUNTER — Ambulatory Visit
Admission: RE | Admit: 2021-11-15 | Discharge: 2021-11-15 | Disposition: A | Discharge status: Home / Self Care | Payer: BC Managed Care – PPO | Source: Ambulatory Visit | Attending: Internal Medicine | Admitting: Internal Medicine

## 2021-11-15 DIAGNOSIS — Z1231 Encounter for screening mammogram for malignant neoplasm of breast: Secondary | ICD-10-CM | POA: Diagnosis present

## 2021-12-24 ENCOUNTER — Ambulatory Visit
Payer: BC Managed Care – PPO | Attending: Student in an Organized Health Care Education/Training Program | Admitting: Student in an Organized Health Care Education/Training Program

## 2021-12-24 ENCOUNTER — Encounter: Payer: Self-pay | Admitting: Student in an Organized Health Care Education/Training Program

## 2021-12-24 VITALS — BP 124/65 | HR 61 | Temp 98.4°F | Resp 20 | Ht 63.0 in | Wt 247.0 lb

## 2021-12-24 DIAGNOSIS — M5416 Radiculopathy, lumbar region: Secondary | ICD-10-CM | POA: Insufficient documentation

## 2021-12-24 DIAGNOSIS — M79605 Pain in left leg: Secondary | ICD-10-CM | POA: Insufficient documentation

## 2021-12-24 DIAGNOSIS — G8929 Other chronic pain: Secondary | ICD-10-CM | POA: Diagnosis present

## 2021-12-24 DIAGNOSIS — M5442 Lumbago with sciatica, left side: Secondary | ICD-10-CM | POA: Insufficient documentation

## 2021-12-24 DIAGNOSIS — M792 Neuralgia and neuritis, unspecified: Secondary | ICD-10-CM | POA: Insufficient documentation

## 2021-12-24 DIAGNOSIS — G894 Chronic pain syndrome: Secondary | ICD-10-CM | POA: Diagnosis present

## 2021-12-24 DIAGNOSIS — M722 Plantar fascial fibromatosis: Secondary | ICD-10-CM | POA: Insufficient documentation

## 2021-12-24 DIAGNOSIS — M5441 Lumbago with sciatica, right side: Secondary | ICD-10-CM | POA: Diagnosis present

## 2021-12-24 DIAGNOSIS — M7918 Myalgia, other site: Secondary | ICD-10-CM | POA: Insufficient documentation

## 2021-12-24 DIAGNOSIS — M79604 Pain in right leg: Secondary | ICD-10-CM | POA: Diagnosis present

## 2021-12-24 MED ORDER — BUPRENORPHINE 7.5 MCG/HR TD PTWK: 1.0000 | MEDICATED_PATCH | TRANSDERMAL | 2 refills | Status: DC

## 2021-12-24 MED ORDER — METHOCARBAMOL 750 MG PO TABS: 750.0000 mg | ORAL_TABLET | Freq: Three times a day (TID) | ORAL | 11 refills | Status: DC | PRN

## 2021-12-24 MED ORDER — MELOXICAM 15 MG PO TABS: 15.0000 mg | ORAL_TABLET | ORAL | 11 refills | Status: DC | PRN

## 2021-12-24 NOTE — Progress Notes (Signed)
Nursing Pain Medication Assessment:  ?Safety precautions to be maintained throughout the outpatient stay will include: orient to surroundings, keep bed in low position, maintain call bell within reach at all times, provide assistance with transfer out of bed and ambulation.  ?Medication Inspection Compliance: Ms. Holm did not comply with our request to bring her pills to be counted. She was reminded that bringing the medication bottles, even when empty, is a requirement. ? ?Medication: None brought in. ?Pill/Patch Count: None available to be counted. ?Bottle Appearance: No container available. Did not bring bottle(s) to appointment. ?Filled Date: N/A ?Last Medication intake:  Ran out of medicine more than 48 hours ago    ?Safety precautions to be maintained throughout the outpatient stay will include: orient to surroundings, keep bed in low position, maintain call bell within reach at all times, provide assistance with transfer out of bed and ambulation.  ? ?Butrans patches was available at her pharmacy ? ?

## 2021-12-24 NOTE — Progress Notes (Signed)
PROVIDER NOTE: Information contained herein reflects review and annotations entered in association with encounter. Interpretation of such information and data should be left to medically-trained personnel. Information provided to patient can be located elsewhere in the medical record under "Patient Instructions". Document created using STT-dictation technology, any transcriptional errors that may result from process are unintentional.  ?  ?Patient: Anita Gibbs  Service Category: E/M  Provider: Gillis Santa, MD  ?DOB: [birth year]  DOS: 12/24/2021  Specialty: Interventional Pain Management  ?MRN: [id number]  Setting: Ambulatory outpatient  PCP: McLean-Scocuzza, Nino Glow, MD  ?Type: Established Patient    Referring Provider: McLean-Scocuzza, Olivia Mackie *  ?Location: Office  Delivery: Face-to-face    ? ?HPI  ?Anita Gibbs, a 51 y.o. year old female, is here today because of her Right lumbar radiculitis [M54.16]. Anita Gibbs primary complain today is Back Pain ? ?Last encounter: [name] was on 10/01/21 ?Pertinent problems: Anita Gibbs has Lumbar facet syndrome (Bilateral) (R>L); Spondylosis without myelopathy or radiculopathy, lumbosacral region; and Morbid obesity with BMI of 45.0-49.9, adult ([place]) on their pertinent problem list. ?Pain Assessment: Severity of Chronic pain is reported as a 8 /10. Location: Back Lower/Denies. Onset: More than a month ago. Quality: Cramping, Aching, Throbbing. Timing: Constant. Modifying factor(s): laying down, heat and massage. ?Vitals:  height is '5\' 3"'$  (1.6 m) and weight is 247 lb (112 kg). Her temperature is 98.4 ?F (36.9 ?C). Her blood pressure is 124/65 and her pulse is 61. Her respiration is 20 and oxygen saturation is 100%.  ? ?Reason for encounter: medication management.   ? ?Patient presents today for medication management.  She is endorsing analgesic and functional benefit with Butrans patch at 7.5 mcg an hour.  She states that she is more functional, has  significantly less pain and has better quality of life.  ?Refill Lyrica and Robaxin as below ?She is complaining of increased low back pain with radiation into bilateral legs in a dermatomal fashion.  She previously had a lumbar epidural steroid injection with me on the left side at L5-S1 on 03/25/2021.  Given increased lumbar radicular pain that is affecting her functional status, we discussed repeating lumbar epidural steroid injection.  Risk and benefits reviewed and patient like to proceed.  We will plan on doing this without sedation as we did last time. ? ? ?Pharmacotherapy Assessment  ?Analgesic: Butrans 7.5 mcg an hour ? ?Monitoring: ?[place] PMP: PDMP reviewed during this encounter.       ?Pharmacotherapy: No side-effects or adverse reactions reported. ?Compliance: No problems identified. ?Effectiveness: Clinically acceptable. ? ?Chauncey Fischer, RN  12/24/2021 11:00 AM  Sign when Signing Visit ?Nursing Pain Medication Assessment:  ?Safety precautions to be maintained throughout the outpatient stay will include: orient to surroundings, keep bed in low position, maintain call bell within reach at all times, provide assistance with transfer out of bed and ambulation.  ?Medication Inspection Compliance: Anita Gibbs did not comply with our request to bring her pills to be counted. She was reminded that bringing the medication bottles, even when empty, is a requirement. ? ?Medication: None brought in. ?Pill/Patch Count: None available to be counted. ?Bottle Appearance: No container available. Did not bring bottle(s) to appointment. ?Filled Date: N/A ?Last Medication intake:  Ran out of medicine more than 48 hours ago    ?Safety precautions to be maintained throughout the outpatient stay will include: orient to surroundings, keep bed in low position, maintain call bell within reach at all times, provide  assistance with transfer out of bed and ambulation.  ? ?Butrans patches was available at her pharmacy ? ?  ?  UDS:   ?Summary  ?Date Value Ref Range Status  ?02/01/2020 Note  Final  ?  Comment:  ?  ==================================================================== ?[employer] Select 13 (MW) ?==================================================================== ?Test                             Result       Flag       Units ?Drug Present not Declared for Prescription Verification ?  Carboxy-THC                    20           UNEXPECTED ng/mg creat ?   Carboxy-THC is a metabolite of tetrahydrocannabinol (THC). Source of ?   THC is most commonly herbal marijuana or marijuana-based products, ?   but THC is also present in a scheduled prescription medication. ?   Trace amounts of THC can be present in hemp and cannabidiol (CBD) ?   products. This test is not intended to distinguish between delta-9- ?   tetrahydrocannabinol, the predominant form of THC in most herbal or ?   marijuana-based products, and delta-8-tetrahydrocannabinol. ?Drug Absent but Declared for Prescription Verification ?  Oxycodone                      Not Detected UNEXPECTED ng/mg creat ?==================================================================== ?Test                      Result    Flag   Units      Ref Range ?  Creatinine              230              mg/dL      >=20 ?==================================================================== ?Declared Medications: ? The flagging and interpretation on this report are based on the ? following declared medications.  Unexpected results may arise from ? inaccuracies in the declared medications. ? **Note: The testing scope of this panel includes these medications: ? Oxycodone (Roxicodone) ? **Note: The testing scope of this panel does not include the ? following reported medications: ? Albuterol (Ventolin HFA) ? Amlodipine (Norvasc) ? Atorvastatin (Lipitor) ? Cetirizine ? Diphenhydramine (Benadryl) ? Epinephrine (EpiPen) ? Fluticasone (Flonase) ? Gabapentin (Neurontin) ? Hydrochlorothiazide ? Losartan (Cozaar) ?  Meloxicam (Mobic) ? Methocarbamol (Robaxin) ? Montelukast (Singulair) ? Norethindrone (Aygestin) ? Pantoprazole (Protonix) ? Vitamin D3 ?==================================================================== ?For clinical consultation, please call [phone]. ?==================================================================== ?  ?  ? ?ROS  ?Constitutional: Denies any fever or chills ?Gastrointestinal: No reported hemesis, hematochezia, vomiting, or acute GI distress ?Musculoskeletal:  Right & left low back pain with radiation to b/l legs  ?Neurological: No reported episodes of acute onset apraxia, aphasia, dysarthria, agnosia, amnesia, paralysis, loss of coordination, or loss of consciousness ? ?Medication Review  ?Cetirizine HCl, Cyanocobalamin, DULoxetine, EPINEPHrine, Lumbar Back Brace/Support Pad, Olopatadine HCl, Vitamin D-3, albuterol, amLODipine, atorvastatin, buprenorphine, diphenhydrAMINE, fluticasone, furosemide, hydrochlorothiazide, hydrocortisone, losartan, meloxicam, methocarbamol, montelukast, pantoprazole, potassium chloride SA, and pregabalin ? ?History Review  ?Allergy: Ms. Mumaw is allergic to other, pineapple, shellfish allergy, strawberry (diagnostic), and tomato. ?Drug: Ms. Galentine  reports no history of drug use. ?Alcohol:  reports current alcohol use of about 3.0 standard drinks per week. ?Tobacco:  reports that she has never smoked. She has never used smokeless tobacco. ?Social:  Ms. Lilja  reports that she has never smoked. She has never used smokeless tobacco. She reports current alcohol use of about 3.0 standard drinks per week. She reports that she does not use drugs. ?Medical:  has a past medical history of Arthritis, Carpal boss, right, Carpal tunnel syndrome, bilateral, Headache, Hives (09/05/2020), Hypertension, and Seasonal asthma (11/29/2018). ?Surgical: Ms. Kley  has a past surgical history that includes Knee arthroscopy (Right); Anterior cervical decomp/discectomy fusion  (N/A, 07/01/2017); Abdominal hysterectomy (10/17/2020); and Carpal tunnel release (Bilateral). ?Family: family history includes [name] in her mother; Arthritis in her father, mother, and sister; Breast cancer

## 2022-01-01 ENCOUNTER — Ambulatory Visit (HOSPITAL_BASED_OUTPATIENT_CLINIC_OR_DEPARTMENT_OTHER): Payer: BC Managed Care – PPO | Admitting: Student in an Organized Health Care Education/Training Program

## 2022-01-01 ENCOUNTER — Ambulatory Visit
Admission: RE | Admit: 2022-01-01 | Discharge: 2022-01-01 | Disposition: A | Discharge status: Home / Self Care | Payer: BC Managed Care – PPO | Source: Ambulatory Visit | Attending: Student in an Organized Health Care Education/Training Program | Admitting: Student in an Organized Health Care Education/Training Program

## 2022-01-01 VITALS — BP 132/71 | HR 81 | Temp 97.5°F | Resp 18 | Ht 63.0 in | Wt 242.0 lb

## 2022-01-01 DIAGNOSIS — G8929 Other chronic pain: Secondary | ICD-10-CM | POA: Diagnosis present

## 2022-01-01 DIAGNOSIS — G894 Chronic pain syndrome: Secondary | ICD-10-CM | POA: Insufficient documentation

## 2022-01-01 DIAGNOSIS — M5416 Radiculopathy, lumbar region: Secondary | ICD-10-CM | POA: Diagnosis not present

## 2022-01-01 DIAGNOSIS — M79605 Pain in left leg: Secondary | ICD-10-CM | POA: Insufficient documentation

## 2022-01-01 DIAGNOSIS — M79604 Pain in right leg: Secondary | ICD-10-CM | POA: Diagnosis present

## 2022-01-01 MED ORDER — DEXAMETHASONE SODIUM PHOSPHATE 10 MG/ML IJ SOLN
INTRAMUSCULAR | Status: AC
  Filled 2022-01-01: qty 1

## 2022-01-01 MED ORDER — IOHEXOL 180 MG/ML  SOLN
10.0000 mL | Freq: Once | INTRAMUSCULAR | Status: AC
  Administered 2022-01-01: 5 mL via EPIDURAL

## 2022-01-01 MED ORDER — SODIUM CHLORIDE (PF) 0.9 % IJ SOLN
INTRAMUSCULAR | Status: AC
  Filled 2022-01-01: qty 10

## 2022-01-01 MED ORDER — ROPIVACAINE HCL 2 MG/ML IJ SOLN
2.0000 mL | Freq: Once | INTRAMUSCULAR | Status: AC
  Administered 2022-01-01: 2 mL via EPIDURAL

## 2022-01-01 MED ORDER — LIDOCAINE HCL 2 % IJ SOLN
20.0000 mL | Freq: Once | INTRAMUSCULAR | Status: AC
  Administered 2022-01-01: 400 mg

## 2022-01-01 MED ORDER — DEXAMETHASONE SODIUM PHOSPHATE 10 MG/ML IJ SOLN
10.0000 mg | Freq: Once | INTRAMUSCULAR | Status: AC
  Administered 2022-01-01: 10 mg

## 2022-01-01 MED ORDER — LIDOCAINE HCL 2 % IJ SOLN
INTRAMUSCULAR | Status: AC
  Filled 2022-01-01: qty 20

## 2022-01-01 MED ORDER — ROPIVACAINE HCL 2 MG/ML IJ SOLN
INTRAMUSCULAR | Status: AC
Start: 2022-01-01 — End: ?
  Filled 2022-01-01: qty 20

## 2022-01-01 MED ORDER — SODIUM CHLORIDE 0.9% FLUSH
2.0000 mL | Freq: Once | INTRAVENOUS | Status: AC
  Administered 2022-01-01: 2 mL

## 2022-01-01 NOTE — Progress Notes (Signed)
PROVIDER NOTE: Information contained herein reflects review and annotations entered in association with encounter. Interpretation of such information and data should be left to medically-trained personnel. Information provided to patient can be located elsewhere in the medical record under "Patient Instructions". Document created using STT-dictation technology, any transcriptional errors that may result from process are unintentional.  ?  ?Patient: Anita Gibbs  Service Category: Procedure  Provider: Gillis Santa, MD  ?DOB: November 24, 1970  DOS: 01/01/2022  Location: [facility]  ?MRN: [id number]  Setting: Ambulatory - outpatient  Referring Provider: McLean-Scocuzza, Olivia Mackie *  ?Type: Established Patient  Specialty: Interventional Pain Management  PCP: McLean-Scocuzza, Nino Glow, MD  ? ?Primary Reason for Visit: Interventional Pain Management Treatment. ?CC: Back Pain (lower) ? ? ?Procedure:          Anesthesia, Analgesia, Anxiolysis:  ?Type: Therapeutic Inter-Laminar Epidural Steroid Injection  #3  ?Region: Lumbar ?Level: L5-S1 Level. ?Laterality: Left-Sided         Type: Local Anesthesia ? ?Local Anesthetic: Lidocaine 1-2% ? ?Position: Prone with head of the table was raised to facilitate breathing.  ? ?Indications: ?1. Lumbar radiculitis (L5 dermatome)    ?2. Chronic lower extremity pain (Fourth Area of Pain) (Bilateral) (R>L)   ?3. Chronic pain syndrome   ? ? ? ?Pain Score: ?Pre-procedure: 7 /10 ?Post-procedure: 1 /10  ? ?Pre-op Assessment:  ?Anita Gibbs is a 51 y.o. (year old), female patient, seen today for interventional treatment. She  has a past surgical history that includes Knee arthroscopy (Right); Anterior cervical decomp/discectomy fusion (N/A, 07/01/2017); Abdominal hysterectomy (10/17/2020); and Carpal tunnel release (Bilateral). Ms. Sheeran has a current medication list which includes the following prescription(s): albuterol, amlodipine, atorvastatin, buprenorphine, cetirizine hcl, vitamin  d-3, cyanocobalamin, diphenhydramine, duloxetine, lumbar back brace/support pad, epinephrine, fluticasone, furosemide, hydrochlorothiazide, hydrocortisone, losartan, meloxicam, methocarbamol, montelukast, olopatadine hcl, pantoprazole, potassium chloride sa, and pregabalin. Her primarily concern today is the Back Pain (lower) ? ? ?Initial Vital Signs:  ?Pulse/HCG Rate: 81ECG Heart Rate: 71 ?Temp: (!) 97.5 ?F (36.4 ?C) ?Resp: 16 ?BP: (!) 124/53 ?SpO2: 99 % ? ?BMI: Estimated body mass index is 42.87 kg/m? as calculated from the following: ?  Height as of this encounter: '5\' 3"'$  (1.6 m). ?  Weight as of this encounter: 242 lb (109.8 kg). ? ?Risk Assessment: ?Allergies: Reviewed. She is allergic to other, pineapple, shellfish allergy, strawberry (diagnostic), and tomato.  ?Allergy Precautions: None required ?Coagulopathies: Reviewed. None identified.  ?Blood-thinner therapy: None at this time ?Active Infection(s): Reviewed. None identified. Anita Gibbs is afebrile ? ?Site Confirmation: Anita Gibbs was asked to confirm the procedure and laterality before marking the site ?Procedure checklist: Completed ?Consent: Before the procedure and under the influence of no sedative(s), amnesic(s), or anxiolytics, the patient was informed of the treatment options, risks and possible complications. To fulfill our ethical and legal obligations, as recommended by the American Medical Association's Code of Ethics, I have informed the patient of my clinical impression; the nature and purpose of the treatment or procedure; the risks, benefits, and possible complications of the intervention; the alternatives, including doing nothing; the risk(s) and benefit(s) of the alternative treatment(s) or procedure(s); and the risk(s) and benefit(s) of doing nothing. ?The patient was provided information about the general risks and possible complications associated with the procedure. These may include, but are not limited to: failure to achieve  desired goals, infection, bleeding, organ or nerve damage, allergic reactions, paralysis, and death. ?In addition, the patient was informed of those risks and complications associated to  Spine-related procedures, such as failure to decrease pain; infection (i.e.: Meningitis, epidural or intraspinal abscess); bleeding (i.e.: epidural hematoma, subarachnoid hemorrhage, or any other type of intraspinal or peri-dural bleeding); organ or nerve damage (i.e.: Any type of peripheral nerve, nerve root, or spinal cord injury) with subsequent damage to sensory, motor, and/or autonomic systems, resulting in permanent pain, numbness, and/or weakness of one or several areas of the body; allergic reactions; (i.e.: anaphylactic reaction); and/or death. ?Furthermore, the patient was informed of those risks and complications associated with the medications. These include, but are not limited to: allergic reactions (i.e.: anaphylactic or anaphylactoid reaction(s)); adrenal axis suppression; blood sugar elevation that in diabetics may result in ketoacidosis or comma; water retention that in patients with history of congestive heart failure may result in shortness of breath, pulmonary edema, and decompensation with resultant heart failure; weight gain; swelling or edema; medication-induced neural toxicity; particulate matter embolism and blood vessel occlusion with resultant organ, and/or nervous system infarction; and/or aseptic necrosis of one or more joints. ?Finally, the patient was informed that Medicine is not an exact science; therefore, there is also the possibility of unforeseen or unpredictable risks and/or possible complications that may result in a catastrophic outcome. The patient indicated having understood very clearly. We have given the patient no guarantees and we have made no promises. Enough time was given to the patient to ask questions, all of which were answered to the patient's satisfaction. Anita Gibbs has  indicated that she wanted to continue with the procedure. ?Attestation: I, the ordering provider, attest that I have discussed with the patient the benefits, risks, side-effects, alternatives, likelihood of achieving goals, and potential problems during recovery for the procedure that I have provided informed consent. ?Date  Time: 01/01/2022  9:37 AM ? ?Pre-Procedure Preparation:  ?Monitoring: As per clinic protocol. Respiration, ETCO2, SpO2, BP, heart rate and rhythm monitor placed and checked for adequate function ?Safety Precautions: Patient was assessed for positional comfort and pressure points before starting the procedure. ?Time-out: I initiated and conducted the "Time-out" before starting the procedure, as per protocol. The patient was asked to participate by confirming the accuracy of the "Time Out" information. Verification of the correct person, site, and procedure were performed and confirmed by me, the nursing staff, and the patient. "Time-out" conducted as per Joint Commission's Universal Protocol (UP.01.01.01). ?Time: 1008 ? ?Description of Procedure:          ?Target Area: The interlaminar space, initially targeting the lower laminar border of the superior vertebral body. ?Approach: Paramedial approach. ?Area Prepped: Entire Posterior Lumbar Region ?DuraPrep (Iodine Povacrylex [0.7% available iodine] and Isopropyl Alcohol, 74% w/w) ?Safety Precautions: Aspiration looking for blood return was conducted prior to all injections. At no point did we inject any substances, as a needle was being advanced. No attempts were made at seeking any paresthesias. Safe injection practices and needle disposal techniques used. Medications properly checked for expiration dates. SDV (single dose vial) medications used. ?Description of the Procedure: Protocol guidelines were followed. The procedure needle was introduced through the skin, ipsilateral to the reported pain, and advanced to the target area. Bone was contacted  and the needle walked caudad, until the lamina was cleared. The epidural space was identified using ?loss-of-resistance technique? with 2-3 ml of PF-NaCl (0.9% NSS), in a 5cc LOR glass syringe. ? ?Vitals:  ? 05/03

## 2022-01-02 ENCOUNTER — Telehealth: Payer: Self-pay | Admitting: *Deleted

## 2022-01-02 NOTE — Telephone Encounter (Signed)
No problems post procedure. 

## 2022-01-28 ENCOUNTER — Ambulatory Visit (INDEPENDENT_AMBULATORY_CARE_PROVIDER_SITE_OTHER): Payer: BC Managed Care – PPO | Admitting: Internal Medicine

## 2022-01-28 ENCOUNTER — Encounter: Payer: Self-pay | Admitting: Internal Medicine

## 2022-01-28 ENCOUNTER — Other Ambulatory Visit (HOSPITAL_COMMUNITY)
Admission: RE | Admit: 2022-01-28 | Discharge: 2022-01-28 | Disposition: A | Discharge status: Home / Self Care | Payer: BC Managed Care – PPO | Source: Ambulatory Visit | Attending: Internal Medicine | Admitting: Internal Medicine

## 2022-01-28 VITALS — BP 120/60 | HR 64 | Temp 98.2°F | Resp 14 | Ht 63.0 in | Wt 250.4 lb

## 2022-01-28 DIAGNOSIS — M545 Low back pain, unspecified: Secondary | ICD-10-CM | POA: Diagnosis not present

## 2022-01-28 DIAGNOSIS — N3000 Acute cystitis without hematuria: Secondary | ICD-10-CM

## 2022-01-28 DIAGNOSIS — Z113 Encounter for screening for infections with a predominantly sexual mode of transmission: Secondary | ICD-10-CM | POA: Insufficient documentation

## 2022-01-28 DIAGNOSIS — R109 Unspecified abdominal pain: Secondary | ICD-10-CM | POA: Diagnosis not present

## 2022-01-28 MED ORDER — METHYLPREDNISOLONE ACETATE 40 MG/ML IJ SUSP
40.0000 mg | Freq: Once | INTRAMUSCULAR | Status: AC
  Administered 2022-01-28: 40 mg via INTRAMUSCULAR

## 2022-01-28 MED ORDER — METRONIDAZOLE 500 MG PO TABS: 500.0000 mg | ORAL_TABLET | Freq: Two times a day (BID) | ORAL | 0 refills | Status: DC

## 2022-01-28 MED ORDER — CIPROFLOXACIN HCL 500 MG PO TABS: 500.0000 mg | ORAL_TABLET | Freq: Two times a day (BID) | ORAL | 0 refills | Status: AC

## 2022-01-28 MED ORDER — DICYCLOMINE HCL 20 MG PO TABS: 20.0000 mg | ORAL_TABLET | Freq: Three times a day (TID) | ORAL | 0 refills | Status: DC

## 2022-01-28 NOTE — Progress Notes (Signed)
Chief Complaint  Patient presents with   Urinary Tract Infection    Pt c/o cramping,burning,&pain ongoing since Thursday. Pain radiates to back    F/u  1. X 5 days Llq ab and lower ab pain and cramping s/p partial hysterectomy ovaries intact. Has burning with urination also pain llq radiates to left lower back not down leg chronic back pain f/u pain clinic tried oxycodone and muscle relaxer from the pain clinic w/o relief  She is sexually active and just got engaged she has a partner   Review of Systems  Constitutional:  Negative for weight loss.  HENT:  Negative for hearing loss.   Eyes:  Negative for blurred vision.  Respiratory:  Negative for shortness of breath.   Cardiovascular:  Negative for chest pain.  Gastrointestinal:  Negative for abdominal pain and blood in stool.  Genitourinary:  Positive for dysuria.  Musculoskeletal:  Positive for back pain. Negative for falls and joint pain.  Skin:  Negative for rash.  Neurological:  Negative for headaches.  Psychiatric/Behavioral:  Negative for depression.   Past Medical History:  Diagnosis Date   Arthritis    Carpal boss, right    Carpal tunnel syndrome, bilateral    Headache    Hives 09/05/2020   Hypertension    Seasonal asthma 11/29/2018   Past Surgical History:  Procedure Laterality Date   ABDOMINAL HYSTERECTOMY  10/17/2020   [facility]   ANTERIOR CERVICAL DECOMP/DISCECTOMY FUSION N/A 07/01/2017   Procedure: ANTERIOR CERVICAL DECOMPRESSION/DISCECTOMY FUSION 3 LEVELS;  Surgeon: Meade Maw, MD;  Location: [facility] ORS;  Service: Neurosurgery;  Laterality: N/A;   CARPAL TUNNEL RELEASE Bilateral    KNEE ARTHROSCOPY Right    Family History  Problem Relation Age of Onset   [name] Mother    Diabetes Mother    Arthritis Mother    Hypertension Mother    Hyperlipidemia Mother    Hypertension Father    Arthritis Father    Hearing loss Father    Kidney disease Father    Lymphoma Father        large b cell    Arthritis Sister     Hypertension Sister    Cancer Brother        ? lung due to exposure    COPD Brother    Early death Brother    COPD Son    Depression Son    Breast cancer Maternal Aunt    Breast cancer Paternal Aunt    Sleep apnea Daughter    Allergic rhinitis Neg Hx    Angioedema Neg Hx    Eczema Neg Hx    Urticaria Neg Hx    Asthma Neg Hx    Social History   Socioeconomic History   Marital status: Widowed    Spouse name: Not on file   Number of children: Not on file   Years of education: Not on file   Highest education level: Not on file  Occupational History   Not on file  Tobacco Use   Smoking status: Never   Smokeless tobacco: Never  Vaping Use   Vaping Use: Never used  Substance and Sexual Activity   Alcohol use: Yes    Alcohol/week: 3.0 standard drinks    Types: 3 Standard drinks or equivalent per week    Comment: occ   Drug use: No   Sexual activity: Yes    Birth control/protection: None  Other Topics Concern   Not on file  Social History Narrative   Bus driver  And school custodian    1 daughter lives in [place] 19 y.o as of 01/17/20   1 son 32 almost 47 in [place] Strain: Not on file  Food Insecurity: Not on file  Transportation Needs: Not on file  Physical Activity: Not on file  Stress: Not on file  Social Connections: Not on file  Intimate Partner Violence: Not on file   Current Meds  Medication Sig   albuterol (VENTOLIN HFA) 108 (90 Base) MCG/ACT inhaler Inhale 2 puffs into the lungs every 6 (six) hours as needed for wheezing or shortness of breath.   amLODipine (NORVASC) 5 MG tablet Take 1 tablet (5 mg total) by mouth daily.   atorvastatin (LIPITOR) 10 MG tablet TAKE 1 TABLET BY MOUTH DAILY AT 6PM   buprenorphine (BUTRANS) 7.5 MCG/HR Place 1 patch onto the skin once a week.   Cetirizine HCl (ZYRTEC PO) Take 25 mg by mouth.   Cholecalciferol (VITAMIN D-3) 125 MCG (5000 UT) TABS Take by mouth  daily.   ciprofloxacin (CIPRO) 500 MG tablet Take 1 tablet (500 mg total) by mouth 2 (two) times daily for 7 days. With food   Cyanocobalamin (B-12 PO) Take by mouth.   dicyclomine (BENTYL) 20 MG tablet Take 1 tablet (20 mg total) by mouth 4 (four) times daily -  before meals and at bedtime.   diphenhydrAMINE (BENADRYL) 25 mg capsule Take 25 mg by mouth every 6 (six) hours as needed (for allergies.).   DULoxetine (CYMBALTA) 20 MG capsule Take 1 capsule (20 mg total) by mouth daily.   Elastic Bandages & Supports (LUMBAR BACK BRACE/SUPPORT PAD) MISC 1 lumbar back brace for lumbar facet pain   EPINEPHrine (AUVI-Q) 0.3 mg/0.3 mL IJ SOAJ injection Use as directed for severe allergic reaction   fluticasone (FLONASE) 50 MCG/ACT nasal spray Place 2 sprays into both nostrils daily.   furosemide (LASIX) 20 MG tablet Take 1 tablet (20 mg total) by mouth daily as needed. X 5-7 days   hydrochlorothiazide (HYDRODIURIL) 25 MG tablet TAKE 1/2 TABLET BY MOUTH EVERY MORNING AS DIRECTED   hydrocortisone 2.5 % cream Apply topically 2 (two) times daily. Prn to eyelids   losartan (COZAAR) 100 MG tablet Take 1 tablet (100 mg total) by mouth daily.   meloxicam (MOBIC) 15 MG tablet Take 1 tablet (15 mg total) by mouth every other day as needed.   methocarbamol (ROBAXIN) 750 MG tablet Take 1 tablet (750 mg total) by mouth every 8 (eight) hours as needed for muscle spasms.   metroNIDAZOLE (FLAGYL) 500 MG tablet Take 1 tablet (500 mg total) by mouth 2 (two) times daily. With food   montelukast (SINGULAIR) 10 MG tablet Take 1 tablet (10 mg total) by mouth at bedtime.   Olopatadine HCl 0.2 % SOLN Apply 1 drop to eye daily. Both eyes   pantoprazole (PROTONIX) 40 MG tablet TAKE ONE TABLET EVERY DAY 30 MIN BEFORE DINNER   potassium chloride SA (KLOR-CON M) 20 MEQ tablet Take 1 tablet (20 mEq total) by mouth daily as needed. With lasix   pregabalin (LYRICA) 75 MG capsule Take 1 capsule (75 mg total) by mouth 3 (three) times  daily.   Current Facility-Administered Medications for the 01/28/22 encounter (Office Visit) with McLean-Scocuzza, Nino Glow, MD  Medication   methylPREDNISolone acetate (DEPO-MEDROL) injection 40 mg   Allergies  Allergen Reactions   Other Hives    White and red sauces   Pineapple  Swelling    Shellfish Allergy Hives   Strawberry (Diagnostic) Hives   Tomato Hives    cherry   No results found for this or any previous visit (from the past 2160 hour(s)). Objective  Body mass index is 44.36 kg/m. Wt Readings from Last 3 Encounters:  01/28/22 250 lb 6.4 oz (113.6 kg)  01/01/22 242 lb (109.8 kg)  12/24/21 247 lb (112 kg)   Temp Readings from Last 3 Encounters:  01/28/22 98.2 F (36.8 C) (Oral)  01/01/22 (!) 97.5 F (36.4 C) (Temporal)  12/24/21 98.4 F (36.9 C)   BP Readings from Last 3 Encounters:  01/28/22 120/60  01/01/22 132/71  12/24/21 124/65   Pulse Readings from Last 3 Encounters:  01/28/22 64  01/01/22 81  12/24/21 61    Physical Exam Vitals and nursing note reviewed.  Constitutional:      Appearance: Normal appearance. She is well-developed and well-groomed.  HENT:     Head: Normocephalic and atraumatic.  Eyes:     Conjunctiva/sclera: Conjunctivae normal.     Pupils: Pupils are equal, round, and reactive to light.  Cardiovascular:     Rate and Rhythm: Normal rate and regular rhythm.     Heart sounds: Normal heart sounds. No murmur heard. Pulmonary:     Effort: Pulmonary effort is normal.     Breath sounds: Normal breath sounds.  Abdominal:     General: Abdomen is flat. Bowel sounds are normal.     Tenderness: There is abdominal tenderness in the right lower quadrant, suprapubic area and left lower quadrant. There is no right CVA tenderness or left CVA tenderness.  Musculoskeletal:     Lumbar back: Tenderness present.  Skin:    General: Skin is warm and dry.  Neurological:     General: No focal deficit present.     Mental Status: She is alert  and oriented to person, place, and time. Mental status is at baseline.     Cranial Nerves: Cranial nerves 2-12 are intact.     Motor: Motor function is intact.     Coordination: Coordination is intact.     Gait: Gait is intact.  Psychiatric:        Attention and Perception: Attention and perception normal.        Mood and Affect: Mood and affect normal.        Speech: Speech normal.        Behavior: Behavior normal. Behavior is cooperative.        Thought Content: Thought content normal.        Cognition and Memory: Cognition and memory normal.        Judgment: Judgment normal.    Assessment  Plan  Flank pain - Plan: Urinalysis, Routine w reflex microscopic, Urine Culture, ciprofloxacin (CIPRO) 500 MG tablet, metroNIDAZOLE (FLAGYL) 500 MG tablet, methylPREDNISolone acetate (DEPO-MEDROL) injection 40 mg  Acute cystitis without hematuria - Plan: Urinalysis, Routine w reflex microscopic, Urine Culture, ciprofloxacin (CIPRO) 500 MG tablet, metroNIDAZOLE (FLAGYL) 500 MG tablet  Abdominal cramping and pain ? Diverticulitis r/o UTI vs GU infection- Plan: ciprofloxacin (CIPRO) 500 MG tablet, metroNIDAZOLE (FLAGYL) 500 MG tablet, dicyclomine (BENTYL) 20 MG tablet qid prn Screening for venereal disease - Plan: Urine cytology ancillary only([place])  Acute left-sided low back pain without sciatica  F/u pain clinic Dr. Holley Raring  Depomedrol 80 x 1  Meds per pain clinic   HM Needs flu shot - Plan: Flu Vaccine QUAD 6+ mos PF IM (Fluarix Quad PF) Tdap  utd  covid vaccine pfizer 3/3 consider 4th dose    mammo 09/30/19 normal ordered, 10/12/20 mammogram rec right breast US b/l screening 10/23/20 rec screening in 1 year Ordered 11/15/21 negative    11/17/2019 colonoscopy - GERD hyperplastic polyp f/u in 5 years    S/p hysterectomy still has ovaries  02/26/18 neg neg hpv h/o abnl h/o fibroids Dr.Song right female pelvic pain    rec healthy diet and exercise    Bilateral carpal tunnel  syndrome Dr. Amedeo Plenty 08/12/21 CTS b/l sch  Pain clinic Dr. Holley Raring   Provider: Dr. Olivia Mackie McLean-Scocuzza-[facility]

## 2022-01-28 NOTE — Patient Instructions (Signed)

## 2022-01-29 ENCOUNTER — Encounter: Payer: Self-pay | Admitting: Student in an Organized Health Care Education/Training Program

## 2022-01-29 ENCOUNTER — Ambulatory Visit
Payer: BC Managed Care – PPO | Attending: Student in an Organized Health Care Education/Training Program | Admitting: Student in an Organized Health Care Education/Training Program

## 2022-01-29 DIAGNOSIS — M47816 Spondylosis without myelopathy or radiculopathy, lumbar region: Secondary | ICD-10-CM | POA: Diagnosis not present

## 2022-01-29 DIAGNOSIS — G894 Chronic pain syndrome: Secondary | ICD-10-CM | POA: Diagnosis not present

## 2022-01-29 DIAGNOSIS — M47812 Spondylosis without myelopathy or radiculopathy, cervical region: Secondary | ICD-10-CM | POA: Diagnosis not present

## 2022-01-29 DIAGNOSIS — M5416 Radiculopathy, lumbar region: Secondary | ICD-10-CM

## 2022-01-29 LAB — URINALYSIS, ROUTINE W REFLEX MICROSCOPIC
Bilirubin Urine: NEGATIVE
Glucose, UA: NEGATIVE
Hgb urine dipstick: NEGATIVE
Ketones, ur: NEGATIVE
Leukocytes,Ua: NEGATIVE
Nitrite: NEGATIVE
Protein, ur: NEGATIVE
Specific Gravity, Urine: 1.022 (ref 1.001–1.035)
pH: <=5 (ref 5.0–8.0)

## 2022-01-29 LAB — URINE CULTURE
MICRO NUMBER:: 13458458
Result:: NO GROWTH
SPECIMEN QUALITY:: ADEQUATE

## 2022-01-29 NOTE — Progress Notes (Signed)
Patient: Anita Gibbs  Service Category: E/M  Provider: Gillis Santa, MD  DOB: [birth year]  DOS: 01/29/2022  Location: Office  MRN: [id number]  Setting: Ambulatory outpatient  Referring Provider: McLean-Scocuzza, Olivia Mackie *  Type: Established Patient  Specialty: Interventional Pain Management  PCP: McLean-Scocuzza, Nino Glow, MD  Location: Remote location  Delivery: TeleHealth     Virtual Encounter - Pain Management PROVIDER NOTE: Information contained herein reflects review and annotations entered in association with encounter. Interpretation of such information and data should be left to medically-trained personnel. Information provided to patient can be located elsewhere in the medical record under "Patient Instructions". Document created using STT-dictation technology, any transcriptional errors that may result from process are unintentional.    Contact & Pharmacy Preferred: [phone] Home: [phone] (home) Mobile: [phone] (mobile) E-mail: [email or web address] .[id number] [address], [address] [place] [address] Phone: [phone] Fax: [phone]  [place], [place] - [address] [place] [address] Phone: [phone] Fax: [phone]   Pre-screening  Anita Gibbs offered "in-person" vs "virtual" encounter. She indicated preferring virtual for this encounter.   Reason COVID-19*  Social distancing based on CDC and AMA recommendations.   I contacted Anita Gibbs on 01/29/2022 via telephone.      I clearly identified myself as Gillis Santa, MD. I verified that I was speaking with the correct person using two identifiers (Name: Anita Gibbs, and date of birth: [birth year]).  Consent I sought verbal advanced consent from Anita Gibbs for virtual visit interactions. I informed Anita Gibbs of possible security and privacy concerns, risks,  and limitations associated with providing "not-in-person" medical evaluation and management services. I also informed Anita Gibbs of the availability of "in-person" appointments. Finally, I informed her that there would be a charge for the virtual visit and that she could be  personally, fully or partially, financially responsible for it. Anita Gibbs expressed understanding and agreed to proceed.   Historic Elements   Anita Gibbs is a 51 y.o. year old, female patient evaluated today after our last contact on 01/01/2022. Anita Gibbs  has a past medical history of Arthritis, Carpal boss, right, Carpal tunnel syndrome, bilateral, Headache, Hives (09/05/2020), Hypertension, and Seasonal asthma (11/29/2018). She also  has a past surgical history that includes Knee arthroscopy (Right); Anterior cervical decomp/discectomy fusion (N/A, 07/01/2017); Abdominal hysterectomy (10/17/2020); and Carpal tunnel release (Bilateral). Anita Gibbs has a current medication list which includes the following prescription(s): albuterol, amlodipine, atorvastatin, buprenorphine, cetirizine hcl, vitamin d-3, cyanocobalamin, diphenhydramine, duloxetine, lumbar back brace/support pad, epinephrine, fluticasone, furosemide, hydrochlorothiazide, hydrocortisone, losartan, meloxicam, methocarbamol, montelukast, olopatadine hcl, pantoprazole, potassium chloride sa, pregabalin, ciprofloxacin, dicyclomine, and metronidazole. She  reports that she has never smoked. She has never used smokeless tobacco. She reports current alcohol use of about 3.0 standard drinks per week. She reports that she does not use drugs. Anita Gibbs is allergic to other, pineapple, shellfish allergy, strawberry (diagnostic), and tomato.   HPI  Today, she is being contacted for a post-procedure assessment.   Post-procedure evaluation    Type: Therapeutic Inter-Laminar Epidural Steroid Injection  #3  Region: Lumbar Level: L5-S1 Level. Laterality: Left-Sided    Effectiveness:  Initial hour after procedure: 100 %  Subsequent 4-6 hours post-procedure: 100 %  Analgesia past initial 6 hours: 100 % (good pain relief x 2 days and then the pain returned.)  Ongoing improvement:  Analgesic:  <20% Function: Back to baseline  Laboratory Chemistry Profile   Renal Lab Results  Component Value Date   BUN 13 08/29/2021   CREATININE 0.92 18/29/9371   BCR NOT APPLICABLE 69/67/8938   GFR 73.82 08/29/2021   GFRAA 76 09/07/2018   GFRNONAA 66 09/07/2018    Hepatic Lab Results  Component Value Date   AST 14 08/29/2021   ALT 14 08/29/2021   ALBUMIN 3.9 08/29/2021   ALKPHOS 66 08/29/2021    Electrolytes Lab Results  Component Value Date   NA 137 08/29/2021   K 3.9 08/29/2021   CL 103 08/29/2021   CALCIUM 9.1 08/29/2021   MG 2.2 09/07/2018    Bone Lab Results  Component Value Date   VD25OH 54.97 01/24/2021   25OHVITD1 8.1 (L) 09/07/2018   25OHVITD2 <1.0 09/07/2018   25OHVITD3 8.1 09/07/2018    Inflammation (CRP: Acute Phase) (ESR: Chronic Phase) Lab Results  Component Value Date   CRP <1.0 01/24/2021   ESRSEDRATE 37 (H) 01/24/2021         Note: Above Lab results reviewed.  Imaging  DG PAIN CLINIC C-ARM 1-60 MIN NO REPORT Fluoro was used, but no Radiologist interpretation will be provided.  Please refer to "NOTES" tab for provider progress note.  Assessment  The primary encounter diagnosis was Lumbar radiculitis (L5 dermatome) . Diagnoses of Lumbar facet arthropathy, Spondylosis without myelopathy or radiculopathy, cervical region, and Chronic pain syndrome were also pertinent to this visit.  Plan of Care  Patient did get benefit from her lumbar epidural steroid injection in regards to her radiating leg pain but she is having increased lower back pain that is refractory to medications.  She went to her primary care provider yesterday and received a steroid injection intramuscular for her increased lower back pain. At this point, I  do not recommend any immediate injection therapies especially since she had neuraxial steroid exposure earlier this month and then systemic IM steroid yesterday.  Keep follow-up as scheduled for July 18 and will reassess then.    Follow-up plan:   Return for Keep sch. appt.    Recent Visits Date Type Provider Dept  01/01/22 Procedure visit Gillis Santa, MD [facility]  12/24/21 Office Visit Gillis Santa, MD [facility]  Showing recent visits within past 90 days and meeting all other requirements Today's Visits Date Type Provider Dept  01/29/22 Office Visit Gillis Santa, MD [facility]  Showing today's visits and meeting all other requirements Future Appointments Date Type Provider Dept  03/18/22 Appointment Gillis Santa, MD [facility]  Showing future appointments within next 90 days and meeting all other requirements  I discussed the assessment and treatment plan with the patient. The patient was provided an opportunity to ask questions and all were answered. The patient agreed with the plan and demonstrated an understanding of the instructions.  Patient advised to call back or seek an in-person evaluation if the symptoms or condition worsens.  Duration of encounter: 80mnutes.  Note by: BGillis Santa MD Date: 01/29/2022; Time: 3:11 PM

## 2022-01-30 ENCOUNTER — Ambulatory Visit: Payer: BC Managed Care – PPO | Admitting: Internal Medicine

## 2022-01-30 LAB — URINE CYTOLOGY ANCILLARY ONLY
Bacterial Vaginitis-Urine: NEGATIVE
Candida Urine: NEGATIVE
Chlamydia: NEGATIVE
Comment: NEGATIVE
Comment: NEGATIVE
Comment: NORMAL
Neisseria Gonorrhea: NEGATIVE
Trichomonas: NEGATIVE

## 2022-02-19 ENCOUNTER — Ambulatory Visit: Payer: BC Managed Care – PPO | Admitting: Internal Medicine

## 2022-02-19 ENCOUNTER — Encounter: Payer: Self-pay | Admitting: Internal Medicine

## 2022-02-19 VITALS — BP 112/74 | HR 85 | Ht 63.0 in | Wt 243.6 lb

## 2022-02-19 DIAGNOSIS — D3502 Benign neoplasm of left adrenal gland: Secondary | ICD-10-CM

## 2022-02-19 LAB — BASIC METABOLIC PANEL
BUN: 18 mg/dL (ref 6–23)
CO2: 32 mEq/L (ref 19–32)
Calcium: 9.4 mg/dL (ref 8.4–10.5)
Chloride: 102 mEq/L (ref 96–112)
Creatinine, Ser: 0.94 mg/dL (ref 0.40–1.20)
GFR: 71.7 mL/min (ref >60.00)
Glucose, Bld: 103 mg/dL — ABNORMAL HIGH (ref 70–99)
Potassium: 3.8 mEq/L (ref 3.5–5.1)
Sodium: 139 mEq/L (ref 135–145)

## 2022-02-19 NOTE — Progress Notes (Signed)
Name: Anita Gibbs  MRN/ DOB: [id number], [birth year]    Age/ Sex: 51 y.o., female     PCP: McLean-Scocuzza, Nino Glow, MD   Reason for Endocrinology Evaluation: Left adrenal adenoma      Initial Endocrinology Clinic Visit: 07/31/2020    PATIENT IDENTIFIER: Anita Gibbs is a 51 y.o., female with a past medical history of HTN and dyslipidemia . She has followed with [facility] since 07/31/2020 for consultative assistance with management of her Left adrenal adenoma .   HISTORICAL SUMMARY:   She had an incidental left adrenal adenoma on a CT scan in 06/2020 during evaluation for abdominal pain that measures 1.4 x 1.2 cm and 48 Hounsfield units.     SUBJECTIVE:     Today (02/19/2022):  [name] is here for left adrenal adenoma    Substantial weight gain-no Centripetal obesity- yes  Easy bruisbility- no Severe hypertension-no DM- no Proximal muscle weakness-yes she attributes this to knee and back pain Sudden/ severe headaches- occasionally  Weight loss- yes Anxiety attacks- yes Sweating- yes Cardiac arrhythmias- no  Palpitations- no  Fluid retention- yes Hypokalemia- not recently      No FH of adrenal problems Mother and sister with DM   HISTORY:  Past Medical History:  Past Medical History:  Diagnosis Date   Arthritis    Carpal boss, right    Carpal tunnel syndrome, bilateral    Headache    Hives 09/05/2020   Hypertension    Seasonal asthma 11/29/2018   Past Surgical History:  Past Surgical History:  Procedure Laterality Date   ABDOMINAL HYSTERECTOMY  10/17/2020   [facility]   ANTERIOR CERVICAL DECOMP/DISCECTOMY FUSION N/A 07/01/2017   Procedure: ANTERIOR CERVICAL DECOMPRESSION/DISCECTOMY FUSION 3 LEVELS;  Surgeon: Meade Maw, MD;  Location: [facility] ORS;  Service: Neurosurgery;  Laterality: N/A;   CARPAL TUNNEL RELEASE Bilateral    KNEE ARTHROSCOPY Right    Social History:  reports that she has never smoked. She has never used  smokeless tobacco. She reports current alcohol use of about 3.0 standard drinks of alcohol per week. She reports that she does not use drugs. Family History:  Family History  Problem Relation Age of Onset   [name] Mother    Diabetes Mother    Arthritis Mother    Hypertension Mother    Hyperlipidemia Mother    Hypertension Father    Arthritis Father    Hearing loss Father    Kidney disease Father    Lymphoma Father        large b cell    Arthritis Sister    Hypertension Sister    Cancer Brother        ? lung due to exposure    COPD Brother    Early death Brother    COPD Son    Depression Son    Breast cancer Maternal Aunt    Breast cancer Paternal Aunt    Sleep apnea Daughter    Allergic rhinitis Neg Hx    Angioedema Neg Hx    Eczema Neg Hx    Urticaria Neg Hx    Asthma Neg Hx      HOME MEDICATIONS: Allergies as of 02/19/2022       Reactions   Other Hives   White and red sauces   Pineapple    Swelling   Shellfish Allergy Hives   Strawberry (diagnostic) Hives   Tomato Hives   cherry        Medication List  Accurate as of February 19, 2022  8:55 AM. If you have any questions, ask your nurse or doctor.          albuterol 108 (90 Base) MCG/ACT inhaler Commonly known as: VENTOLIN HFA Inhale 2 puffs into the lungs every 6 (six) hours as needed for wheezing or shortness of breath.   amLODipine 5 MG tablet Commonly known as: NORVASC Take 1 tablet (5 mg total) by mouth daily.   atorvastatin 10 MG tablet Commonly known as: LIPITOR TAKE 1 TABLET BY MOUTH DAILY AT 6PM   B-12 PO Take by mouth.   buprenorphine 7.5 MCG/HR Commonly known as: Butrans Place 1 patch onto the skin once a week.   dicyclomine 20 MG tablet Commonly known as: BENTYL Take 1 tablet (20 mg total) by mouth 4 (four) times daily -  before meals and at bedtime.   diphenhydrAMINE 25 mg capsule Commonly known as: BENADRYL Take 25 mg by mouth every 6 (six) hours as needed (for  allergies.).   DULoxetine 20 MG capsule Commonly known as: Cymbalta Take 1 capsule (20 mg total) by mouth daily.   EPINEPHrine 0.3 mg/0.3 mL Soaj injection Commonly known as: Auvi-Q Use as directed for severe allergic reaction   fluticasone 50 MCG/ACT nasal spray Commonly known as: FLONASE Place 2 sprays into both nostrils daily.   furosemide 20 MG tablet Commonly known as: LASIX Take 1 tablet (20 mg total) by mouth daily as needed. X 5-7 days   hydrochlorothiazide 25 MG tablet Commonly known as: HYDRODIURIL TAKE 1/2 TABLET BY MOUTH EVERY MORNING AS DIRECTED   hydrocortisone 2.5 % cream Apply topically 2 (two) times daily. Prn to eyelids   losartan 100 MG tablet Commonly known as: COZAAR Take 1 tablet (100 mg total) by mouth daily.   Lumbar Back Brace/Support Pad Misc 1 lumbar back brace for lumbar facet pain   meloxicam 15 MG tablet Commonly known as: MOBIC Take 1 tablet (15 mg total) by mouth every other day as needed.   methocarbamol 750 MG tablet Commonly known as: ROBAXIN Take 1 tablet (750 mg total) by mouth every 8 (eight) hours as needed for muscle spasms.   metroNIDAZOLE 500 MG tablet Commonly known as: Flagyl Take 1 tablet (500 mg total) by mouth 2 (two) times daily. With food   montelukast 10 MG tablet Commonly known as: SINGULAIR Take 1 tablet (10 mg total) by mouth at bedtime.   Olopatadine HCl 0.2 % Soln Apply 1 drop to eye daily. Both eyes   pantoprazole 40 MG tablet Commonly known as: PROTONIX TAKE ONE TABLET EVERY DAY 30 MIN BEFORE DINNER   potassium chloride SA 20 MEQ tablet Commonly known as: KLOR-CON M Take 1 tablet (20 mEq total) by mouth daily as needed. With lasix   pregabalin 75 MG capsule Commonly known as: Lyrica Take 1 capsule (75 mg total) by mouth 3 (three) times daily.   Vitamin D-3 125 MCG (5000 UT) Tabs Take by mouth daily.   ZYRTEC PO Take 25 mg by mouth.          OBJECTIVE:   PHYSICAL EXAM: VS: BP 112/74  (BP Location: Left Arm, Patient Position: Sitting, Cuff Size: Large)   Pulse 85   Ht '5\' 3"'$  (1.6 m)   Wt 243 lb 9.6 oz (110.5 kg)   LMP  (LMP Unknown)   SpO2 98%   BMI 43.15 kg/m    EXAM: General: Pt appears well and is in NAD  Neck: General: Supple without adenopathy. Thyroid:  Thyroid size normal.  No goiter or nodules appreciated.   Lungs: Clear with good BS bilat with no rales, rhonchi, or wheezes  Heart: Auscultation: RRR.  Abdomen: Normoactive bowel sounds, soft, nontender, without masses or organomegaly palpable  Extremities:  BL LE: No pretibial edema normal ROM and strength.  Mental Status: Judgment, insight: Intact Orientation: Oriented to time, place, and person Memory: Intact for recent and remote events Mood and affect: No depression, anxiety, or agitation     DATA REVIEWED:   Latest Reference Range & Units 02/19/22 09:09  Sodium 135 - 145 mEq/L 139  Potassium 3.5 - 5.1 mEq/L 3.8  Chloride 96 - 112 mEq/L 102  CO2 19 - 32 mEq/L 32  Glucose 70 - 99 mg/dL 103 (H)  BUN 6 - 23 mg/dL 18  Creatinine 0.40 - 1.20 mg/dL 0.94  Calcium 8.4 - 10.5 mg/dL 9.4  GFR >60.00 mL/min 71.70    Latest Reference Range & Units 03/18/21 15:03  Cortisol (Ur), Free 4.0 - 50.0 mcg/24 h 11.8    CT abdomen 06/19/2021  Adrenals/Urinary Tract: Low-density nodule in the left adrenal gland measures 1.3 x 1.4 cm and previously measured 1.4 x 1.2 cm. Hounsfield units are less than 10. This left adrenal nodule is compatible with a benign adenoma. Normal right adrenal gland. Normal appearance of both kidneys. No hydronephrosis.     ASSESSMENT / PLAN / RECOMMENDATIONS:   Left adrenal adenoma    -We had screened her for hyperaldosteronemia as well as Cushing syndrome and pheo  in 2021 and 2022 -Imaging  for left adrenal adenoma  shows stability x 2 (2021 and 2022)  - No clinical suspicion for pheochromocytoma at this time  - Will proceed with aldo , renin and cushing syndrome  screening - BMP normal today   Follow-up in 1 year   Signed electronically by: Mack Guise, MD  [facility]  [address] [place], [place] [address] Phone: [phone] FAX: [phone]      CC: McLean-Scocuzza, Nino Glow, MD [place] [address] Phone: [phone]  Fax: [phone]   Return to Endocrinology clinic as below: Future Appointments  Date Time Provider [place]  02/19/2022  9:10 AM Takya Vandivier, Melanie Crazier, MD [facility] None  03/05/2022  9:40 AM McLean-Scocuzza, Nino Glow, MD [facility]  03/18/2022 10:00 AM Gillis Santa, MD [facility] None

## 2022-02-19 NOTE — Patient Instructions (Signed)

## 2022-02-20 ENCOUNTER — Other Ambulatory Visit: Payer: BC Managed Care – PPO

## 2022-02-24 ENCOUNTER — Other Ambulatory Visit: Payer: BC Managed Care – PPO

## 2022-02-24 DIAGNOSIS — D3502 Benign neoplasm of left adrenal gland: Secondary | ICD-10-CM

## 2022-02-26 ENCOUNTER — Other Ambulatory Visit: Payer: Self-pay | Admitting: Internal Medicine

## 2022-02-26 DIAGNOSIS — E785 Hyperlipidemia, unspecified: Secondary | ICD-10-CM

## 2022-02-26 DIAGNOSIS — I10 Essential (primary) hypertension: Secondary | ICD-10-CM

## 2022-02-28 LAB — CORTISOL, URINE, 24 HOUR
24 Hour urine volume (VMAHVA): 800 mL
CREATININE, URINE: 1.6 g/(24.h) (ref 0.50–2.15)
Cortisol (Ur), Free: 7 mcg/24 h (ref 4.0–50.0)

## 2022-03-04 LAB — ALDOSTERONE + RENIN ACTIVITY W/ RATIO
ALDOS/RENIN RATIO: 2 (ref 0.0–30.0)
ALDOSTERONE: 5 ng/dL (ref 0.0–30.0)
Renin: 2.493 ng/mL/hr (ref 0.167–5.380)

## 2022-03-05 ENCOUNTER — Ambulatory Visit: Payer: BC Managed Care – PPO | Admitting: Internal Medicine

## 2022-03-07 ENCOUNTER — Encounter: Payer: Self-pay | Admitting: Internal Medicine

## 2022-03-12 ENCOUNTER — Ambulatory Visit: Payer: BC Managed Care – PPO | Admitting: Internal Medicine

## 2022-03-14 ENCOUNTER — Encounter: Payer: Self-pay | Admitting: Internal Medicine

## 2022-03-14 ENCOUNTER — Ambulatory Visit: Payer: BC Managed Care – PPO | Admitting: Internal Medicine

## 2022-03-14 VITALS — BP 130/80 | HR 70 | Temp 98.6°F | Ht 63.0 in | Wt 248.8 lb

## 2022-03-14 DIAGNOSIS — R600 Localized edema: Secondary | ICD-10-CM

## 2022-03-14 DIAGNOSIS — K219 Gastro-esophageal reflux disease without esophagitis: Secondary | ICD-10-CM | POA: Diagnosis not present

## 2022-03-14 DIAGNOSIS — J309 Allergic rhinitis, unspecified: Secondary | ICD-10-CM

## 2022-03-14 DIAGNOSIS — G629 Polyneuropathy, unspecified: Secondary | ICD-10-CM

## 2022-03-14 DIAGNOSIS — F419 Anxiety disorder, unspecified: Secondary | ICD-10-CM

## 2022-03-14 DIAGNOSIS — E785 Hyperlipidemia, unspecified: Secondary | ICD-10-CM

## 2022-03-14 DIAGNOSIS — M5416 Radiculopathy, lumbar region: Secondary | ICD-10-CM

## 2022-03-14 DIAGNOSIS — I10 Essential (primary) hypertension: Secondary | ICD-10-CM

## 2022-03-14 DIAGNOSIS — M62838 Other muscle spasm: Secondary | ICD-10-CM

## 2022-03-14 DIAGNOSIS — R252 Cramp and spasm: Secondary | ICD-10-CM

## 2022-03-14 DIAGNOSIS — F32A Depression, unspecified: Secondary | ICD-10-CM

## 2022-03-14 DIAGNOSIS — R059 Cough, unspecified: Secondary | ICD-10-CM

## 2022-03-14 MED ORDER — ALBUTEROL SULFATE HFA 108 (90 BASE) MCG/ACT IN AERS: 2.0000 | INHALATION_SPRAY | Freq: Four times a day (QID) | RESPIRATORY_TRACT | 11 refills | Status: DC | PRN

## 2022-03-14 MED ORDER — POTASSIUM CHLORIDE CRYS ER 20 MEQ PO TBCR: 20.0000 meq | EXTENDED_RELEASE_TABLET | Freq: Every day | ORAL | 11 refills | Status: DC | PRN

## 2022-03-14 MED ORDER — DULOXETINE HCL 20 MG PO CPEP: 20.0000 mg | ORAL_CAPSULE | Freq: Every day | ORAL | 3 refills | Status: DC

## 2022-03-14 MED ORDER — MONTELUKAST SODIUM 10 MG PO TABS: 10.0000 mg | ORAL_TABLET | Freq: Every day | ORAL | 3 refills | Status: DC

## 2022-03-14 MED ORDER — PANTOPRAZOLE SODIUM 40 MG PO TBEC: DELAYED_RELEASE_TABLET | ORAL | 3 refills | Status: DC

## 2022-03-14 MED ORDER — ATORVASTATIN CALCIUM 10 MG PO TABS: ORAL_TABLET | ORAL | 3 refills | Status: DC

## 2022-03-14 MED ORDER — HYDROCHLOROTHIAZIDE 25 MG PO TABS: ORAL_TABLET | ORAL | 3 refills | Status: DC

## 2022-03-14 MED ORDER — LOSARTAN POTASSIUM 100 MG PO TABS: 100.0000 mg | ORAL_TABLET | Freq: Every day | ORAL | 3 refills | Status: DC

## 2022-03-14 MED ORDER — FUROSEMIDE 20 MG PO TABS: 20.0000 mg | ORAL_TABLET | Freq: Every day | ORAL | 11 refills | Status: DC | PRN

## 2022-03-14 MED ORDER — CETIRIZINE HCL 10 MG PO TABS: 10.0000 mg | ORAL_TABLET | Freq: Every evening | ORAL | 3 refills | Status: DC | PRN

## 2022-03-14 MED ORDER — AMLODIPINE BESYLATE 5 MG PO TABS
5.0000 mg | ORAL_TABLET | Freq: Every day | ORAL | 3 refills | Status: DC
Start: 2022-03-14 — End: 2022-08-05

## 2022-03-14 NOTE — Progress Notes (Signed)
Chief Complaint  Patient presents with   Follow-up    6 mo   F/u  1. Mid low back pain and pain medication is not helping pain rad to right lower leg and having random muscle cramps/spasms in legs appt Dr. Holley Raring pain clinic 03/18/22     Review of Systems  Constitutional:  Negative for weight loss.  HENT:  Negative for hearing loss.   Eyes:  Negative for blurred vision.  Respiratory:  Negative for shortness of breath.   Cardiovascular:  Negative for chest pain.  Gastrointestinal:  Negative for abdominal pain and blood in stool.  Genitourinary:  Negative for dysuria.  Musculoskeletal:  Positive for myalgias. Negative for falls and joint pain.       +cramps  Skin:  Negative for rash.  Neurological:  Negative for headaches.  Psychiatric/Behavioral:  Negative for depression.    Past Medical History:  Diagnosis Date   Arthritis    Carpal boss, right    Carpal tunnel syndrome, bilateral    Headache    Hives 09/05/2020   Hypertension    Seasonal asthma 11/29/2018   Past Surgical History:  Procedure Laterality Date   ABDOMINAL HYSTERECTOMY  10/17/2020   [facility]   ANTERIOR CERVICAL DECOMP/DISCECTOMY FUSION N/A 07/01/2017   Procedure: ANTERIOR CERVICAL DECOMPRESSION/DISCECTOMY FUSION 3 LEVELS;  Surgeon: Meade Maw, MD;  Location: [facility] OR[facility];  Service: Neurosurgery;  Laterality: N/A;   CARPAL TUNNEL RELEASE Bilateral    KNEE ARTHROSCOPY Right    Family History  Problem Relation Age of Onset   [name] Mother    Diabetes Mother    Arthritis Mother    Hypertension Mother    Hyperlipidemia Mother    Hypertension Father    Arthritis Father    Hearing loss Father    Kidney disease Father    Lymphoma Father        large b cell    Arthritis Sister    Hypertension Sister    Cancer Brother        ? lung due to exposure    COPD Brother    Early death Brother    COPD Son    Depression Son    Breast cancer Maternal Aunt    Breast cancer Paternal Aunt    Sleep apnea Daughter     Allergic rhinitis Neg Hx    Angioedema Neg Hx    Eczema Neg Hx    Urticaria Neg Hx    Asthma Neg Hx    Social History   Socioeconomic History   Marital status: Widowed    Spouse name: Not on file   Number of children: Not on file   Years of education: Not on file   Highest education level: Not on file  Occupational History   Not on file  Tobacco Use   Smoking status: Never   Smokeless tobacco: Never  Vaping Use   Vaping Use: Never used  Substance and Sexual Activity   Alcohol use: Yes    Alcohol/week: 3.0 standard drinks of alcohol    Types: 3 Standard drinks or equivalent per week    Comment: occ   Drug use: No   Sexual activity: Yes    Birth control/protection: None  Other Topics Concern   Not on file  Social History Narrative   Bus driver    And school custodian    1 daughter lives in [place] 49 y.o as of 01/17/20   1 son 45 almost 46 in [place]  [place]    Social Determinants of Health   Financial Resource Strain: Not on file  Food Insecurity: Not on file  Transportation Needs: Not on file  Physical Activity: Not on file  Stress: Not on file  Social Connections: Not on file  Intimate Partner Violence: Not on file   Current Meds  Medication Sig   buprenorphine (BUTRANS) 7.5 MCG/HR Place 1 patch onto the skin once a week.   cetirizine (ZYRTEC ALLERGY) 10 MG tablet Take 1 tablet (10 mg total) by mouth at bedtime as needed.   Cholecalciferol (VITAMIN D-3) 125 MCG (5000 UT) TABS Take by mouth daily.   Cyanocobalamin (B-12 PO) Take by mouth.   dicyclomine (BENTYL) 20 MG tablet Take 1 tablet (20 mg total) by mouth 4 (four) times daily -  before meals and at bedtime.   diphenhydrAMINE (BENADRYL) 25 mg capsule Take 25 mg by mouth every 6 (six) hours as needed (for allergies.).   Elastic Bandages & Supports (LUMBAR BACK BRACE/SUPPORT PAD) MISC 1 lumbar back brace for lumbar facet pain   EPINEPHrine (AUVI-Q) 0.3 mg/0.3 mL IJ SOAJ injection  Use as directed for severe allergic reaction   fluticasone (FLONASE) 50 MCG/ACT nasal spray Place 2 sprays into both nostrils daily.   hydrocortisone 2.5 % cream Apply topically 2 (two) times daily. Prn to eyelids   meloxicam (MOBIC) 15 MG tablet Take 1 tablet (15 mg total) by mouth every other day as needed.   methocarbamol (ROBAXIN) 750 MG tablet Take 1 tablet (750 mg total) by mouth every 8 (eight) hours as needed for muscle spasms.   metroNIDAZOLE (FLAGYL) 500 MG tablet Take 1 tablet (500 mg total) by mouth 2 (two) times daily. With food   Olopatadine HCl 0.2 % SOLN Apply 1 drop to eye daily. Both eyes   pregabalin (LYRICA) 75 MG capsule Take 1 capsule (75 mg total) by mouth 3 (three) times daily.   [DISCONTINUED] albuterol (VENTOLIN HFA) 108 (90 Base) MCG/ACT inhaler Inhale 2 puffs into the lungs every 6 (six) hours as needed for wheezing or shortness of breath.   [DISCONTINUED] amLODipine (NORVASC) 5 MG tablet Take 1 tablet (5 mg total) by mouth daily.   [DISCONTINUED] atorvastatin (LIPITOR) 10 MG tablet TAKE 1 TABLET BY MOUTH DAILY AT 6PM   [DISCONTINUED] Cetirizine HCl (ZYRTEC PO) Take 25 mg by mouth.   [DISCONTINUED] DULoxetine (CYMBALTA) 20 MG capsule Take 1 capsule (20 mg total) by mouth daily.   [DISCONTINUED] furosemide (LASIX) 20 MG tablet Take 1 tablet (20 mg total) by mouth daily as needed. X 5-7 days   [DISCONTINUED] hydrochlorothiazide (HYDRODIURIL) 25 MG tablet TAKE 1/2 TABLET EVERY MORNING   [DISCONTINUED] losartan (COZAAR) 100 MG tablet Take 1 tablet (100 mg total) by mouth daily.   [DISCONTINUED] montelukast (SINGULAIR) 10 MG tablet Take 1 tablet (10 mg total) by mouth at bedtime.   [DISCONTINUED] pantoprazole (PROTONIX) 40 MG tablet TAKE ONE TABLET EVERY DAY 30 MIN BEFORE DINNER   [DISCONTINUED] potassium chloride SA (KLOR-CON M) 20 MEQ tablet Take 1 tablet (20 mEq total) by mouth daily as needed. With lasix   Allergies  Allergen Reactions   Other Hives    White and red  sauces   Pineapple     Swelling    Shellfish Allergy Hives   Strawberry (Diagnostic) Hives   Tomato Hives    cherry   Recent Results (from the past 2160 hour(s))  Urine cytology ancillary only([place])     Status: None  Collection Time: 01/28/22 11:01 AM  Result Value Ref Range   Neisseria Gonorrhea Negative    Chlamydia Negative    Trichomonas Negative    Bacterial Vaginitis-Urine Negative    Candida Urine Negative    Molecular Comment      This specimen does not meet the strict criteria set by the FDA. The   Molecular Comment      result interpretation should be considered in conjunction with the   Molecular Comment patient's clinical history.    Comment Normal Reference Range Trichomonas - Negative    Comment Normal Reference Ranger Chlamydia - Negative    Comment      Normal Reference Range Neisseria Gonorrhea - Negative  Urinalysis, Routine w reflex microscopic     Status: Abnormal   Collection Time: 01/28/22 11:11 AM  Result Value Ref Range   Color, Urine YELLOW YELLOW   APPearance TURBID (A) CLEAR   Specific Gravity, Urine 1.022 1.001 - 1.035   pH < OR = 5.0 5.0 - 8.0   Glucose, UA NEGATIVE NEGATIVE   Bilirubin Urine NEGATIVE NEGATIVE   Ketones, ur NEGATIVE NEGATIVE   Hgb urine dipstick NEGATIVE NEGATIVE   Protein, ur NEGATIVE NEGATIVE   Nitrite NEGATIVE NEGATIVE   Leukocytes,Ua NEGATIVE NEGATIVE  Urine Culture     Status: None   Collection Time: 01/28/22 11:11 AM   Specimen: Urine  Result Value Ref Range   MICRO NUMBER: [id number]    SPECIMEN QUALITY: Adequate    Sample Source URINE    STATUS: FINAL    Result: No Growth   Basic metabolic panel     Status: Abnormal   Collection Time: 02/19/22  9:09 AM  Result Value Ref Range   Sodium 139 135 - 145 mEq/L   Potassium 3.8 3.5 - 5.1 mEq/L   Chloride 102 96 - 112 mEq/L   CO2 32 19 - 32 mEq/L   Glucose, Bld 103 (H) 70 - 99 mg/dL   BUN 18 6 - 23 mg/dL   Creatinine, Ser 0.94 0.40 - 1.20 mg/dL   GFR 71.70  >60.00 mL/min    Comment: Calculated using the CKD-EPI Creatinine Equation (2021)   Calcium 9.4 8.4 - 10.5 mg/dL  Aldosterone + renin activity w/ ratio     Status: None   Collection Time: 02/20/22  9:09 AM  Result Value Ref Range   ALDOSTERONE 5.0 0.0 - 30.0 ng/dL   Renin 2.493 0.167 - 5.380 ng/mL/hr   ALDOS/RENIN RATIO 2.0 0.0 - 30.0    Comment:                          Units:      ng/dL per ng/mL/hr  Cortisol,free,24 hour urine w/creatinine     Status: None   Collection Time: 02/24/22 11:34 AM  Result Value Ref Range   24 Hour urine volume (VMAHVA) 800 mL   Cortisol (Ur), Free 7.0 4.0 - 50.0 mcg/24 h    Comment: . This test was developed and its analytical performance characteristics have been determined by [facility]. It has not been cleared or approved by FDA. This assay has been validated pursuant to the CLIA regulations and is used for clinical purposes.    CREATININE, URINE 1.60 0.50 - 2.15 g/24 h   Objective  Body mass index is 44.07 kg/m. Wt Readings from Last 3 Encounters:  03/14/22 248 lb 12.8 oz (112.9 kg)  02/19/22 243 lb 9.6  oz (110.5 kg)  01/28/22 250 lb 6.4 oz (113.6 kg)   Temp Readings from Last 3 Encounters:  03/14/22 98.6 F (37 C) (Oral)  01/28/22 98.2 F (36.8 C) (Oral)  01/01/22 (!) 97.5 F (36.4 C) (Temporal)   BP Readings from Last 3 Encounters:  03/14/22 130/80  02/19/22 112/74  01/28/22 120/60   Pulse Readings from Last 3 Encounters:  03/14/22 70  02/19/22 85  01/28/22 64    Physical Exam Vitals and nursing note reviewed.  Constitutional:      Appearance: Normal appearance. She is well-developed and well-groomed.  HENT:     Head: Normocephalic and atraumatic.  Eyes:     Conjunctiva/sclera: Conjunctivae normal.     Pupils: Pupils are equal, round, and reactive to light.  Cardiovascular:     Rate and Rhythm: Normal rate and regular rhythm.     Heart sounds: Normal heart sounds. No  murmur heard. Pulmonary:     Effort: Pulmonary effort is normal.     Breath sounds: Normal breath sounds.  Abdominal:     General: Abdomen is flat. Bowel sounds are normal.     Tenderness: There is no abdominal tenderness.  Musculoskeletal:        General: No tenderness.  Skin:    General: Skin is warm and dry.  Neurological:     General: No focal deficit present.     Mental Status: She is alert and oriented to person, place, and time. Mental status is at baseline.     Cranial Nerves: Cranial nerves 2-12 are intact.     Motor: Motor function is intact.     Coordination: Coordination is intact.     Gait: Gait is intact.  Psychiatric:        Attention and Perception: Attention and perception normal.        Mood and Affect: Mood and affect normal.        Speech: Speech normal.        Behavior: Behavior normal. Behavior is cooperative.        Thought Content: Thought content normal.        Cognition and Memory: Cognition and memory normal.        Judgment: Judgment normal.     Assessment  Plan  Lumbar radiculopathy F/u pain clinic 03/18/22  Hyperlipidemia, unspecified hyperlipidemia type - Plan: atorvastatin (LIPITOR) 10 MG tablet reduced to MWF ? If causing leg cramps   Bilateral leg edema - Plan: potassium chloride SA (KLOR-CON M) 20 MEQ tablet, furosemide (LASIX) 20 MG tablet  Gastroesophageal reflux disease, unspecified whether esophagitis present - Plan: pantoprazole (PROTONIX) 40 MG tablet  Allergic rhinitis, unspecified seasonality, unspecified trigger - Plan: montelukast (SINGULAIR) 10 MG tablet  Essential hypertension controlled- Plan: losartan (COZAAR) 100 MG tablet, hydrochlorothiazide (HYDRODIURIL) 25 MG tablet, amLODipine (NORVASC) 5 MG tablet  Neuropathy - Plan: DULoxetine (CYMBALTA) 20 MG capsule  Polyneuropathy - Plan: DULoxetine (CYMBALTA) 20 MG capsule  Anxiety and depression - Plan: DULoxetine (CYMBALTA) 20 MG capsule   Muscle spasm Muscle cramps  ?  Meds norvasc, lipitor reduced dose ? Pain meds  Hydrate  Theraworx  Magnesium for leg cramps  Hylands supplement for leg cramps  F/u pain clinic 03/18/22  HM Flu shot Tdap utd  covid vaccine pfizer 3/3 consider 4th dose    mammo 09/30/19 normal ordered, 10/12/20 mammogram rec right breast US b/l screening 10/23/20 rec screening in 1 year Ordered 11/15/21 negative    11/17/2019 colonoscopy - GERD hyperplastic polyp f/u in  5 years    S/p hysterectomy still has ovaries  02/26/18 neg neg hpv h/o abnl h/o fibroids Dr.Song right female pelvic pain    rec healthy diet and exercise    Bilateral carpal tunnel syndrome Dr. Amedeo Plenty 08/12/21 CTS b/l sch  Pain clinic Dr. Holley Raring appt 03/18/22   Provider: Dr. Olivia Mackie McLean-Scocuzza-Internal Medicine

## 2022-03-14 NOTE — Patient Instructions (Addendum)
[employer]x  Magnesium for leg cramps  [place]ylands supplement for leg cramps   Dr. Volanda Napoleon   Muscle Cramps and Spasms Muscle cramps and spasms occur when a muscle or muscles tighten and you have no control over this tightening (involuntary muscle contraction). They are a common problem and can develop in any muscle. The most common place is in the calf muscles of the leg. Muscle cramps and muscle spasms are both involuntary muscle contractions, but there are some differences between the two: Muscle cramps are painful. They come and go and may last for a few seconds or up to 15 minutes. Muscle cramps are often more forceful and last longer than muscle spasms. Muscle spasms may or may not be painful. They may also last just a few seconds or much longer. Certain medical conditions, such as diabetes or Parkinson's disease, can make it more likely to develop cramps or spasms. However, cramps or spasms are usually not caused by a serious underlying problem. Common causes include: Doing more physical work or exercise than your body is ready for (overexertion). Overuse from repeating certain movements too many times. Remaining in a certain position for a long period of time. Improper preparation, form, or technique while playing a sport or doing an activity. Dehydration. Injury. Side effects of some medicines. Abnormally low levels of the salts and minerals in your blood (electrolytes), especially potassium and calcium. This could happen if you are taking water pills (diuretics) or if you are pregnant. In many cases, the cause of muscle cramps or spasms is not known. Follow these instructions at home: Managing pain and stiffness     Try massaging, stretching, and relaxing the affected muscle. Do this for several minutes at a time. If directed, apply heat to tight or tense muscles as often as told by your health care provider. Use the heat source that your health care provider recommends, such as a moist  heat pack or a heating pad. Place a towel between your skin and the heat source. Leave the heat on for 20-30 minutes. Remove the heat if your skin turns bright red. This is especially important if you are unable to feel pain, heat, or cold. You may have a greater risk of getting burned. If directed, put ice on the affected area. This may help if you are sore or have pain after a cramp or spasm. Put ice in a plastic bag. Place a towel between your skin and the bag. Leave the ice on for 20 minutes, 2-3 times a day. Try taking hot showers or baths to help relax tight muscles. Eating and drinking Drink enough fluid to keep your urine pale yellow. Staying well hydrated may help prevent cramps or spasms. Eat a healthy diet that includes plenty of nutrients to help your muscles function. A healthy diet includes fruits and vegetables, lean protein, whole grains, and low-fat or nonfat dairy products. General instructions If you are having frequent cramps, avoid intense exercise for several days. Take over-the-counter and prescription medicines only as told by your health care provider. Pay attention to any changes in your symptoms. Keep all follow-up visits as told by your health care provider. This is important. Contact a health care provider if: Your cramps or spasms get more severe or happen more often. Your cramps or spasms do not improve over time. Summary Muscle cramps and spasms occur when a muscle or muscles tighten and you have no control over this tightening (involuntary muscle contraction). The most common place for cramps  or spasms to occur is in the calf muscles of the leg. Massaging, stretching, and relaxing the affected muscle may relieve the cramp or spasm. Drink enough fluid to keep your urine pale yellow. Staying well hydrated may help prevent cramps or spasms. This information is not intended to replace advice given to you by your health care provider. Make sure you discuss any  questions you have with your health care provider. Document Revised: 03/08/2021 Document Reviewed: 03/08/2021 Elsevier Patient Education  [place].

## 2022-03-18 ENCOUNTER — Encounter: Payer: Self-pay | Admitting: Student in an Organized Health Care Education/Training Program

## 2022-03-18 ENCOUNTER — Ambulatory Visit
Payer: BC Managed Care – PPO | Attending: Student in an Organized Health Care Education/Training Program | Admitting: Student in an Organized Health Care Education/Training Program

## 2022-03-18 VITALS — BP 124/70 | HR 64 | Temp 97.2°F | Resp 16 | Ht 63.0 in | Wt 243.0 lb

## 2022-03-18 DIAGNOSIS — M79605 Pain in left leg: Secondary | ICD-10-CM | POA: Diagnosis not present

## 2022-03-18 DIAGNOSIS — G894 Chronic pain syndrome: Secondary | ICD-10-CM | POA: Insufficient documentation

## 2022-03-18 DIAGNOSIS — M5416 Radiculopathy, lumbar region: Secondary | ICD-10-CM | POA: Diagnosis present

## 2022-03-18 DIAGNOSIS — M79604 Pain in right leg: Secondary | ICD-10-CM | POA: Insufficient documentation

## 2022-03-18 DIAGNOSIS — G8929 Other chronic pain: Secondary | ICD-10-CM | POA: Diagnosis present

## 2022-03-18 DIAGNOSIS — M792 Neuralgia and neuritis, unspecified: Secondary | ICD-10-CM | POA: Insufficient documentation

## 2022-03-18 MED ORDER — BUPRENORPHINE 7.5 MCG/HR TD PTWK: 1.0000 | MEDICATED_PATCH | TRANSDERMAL | 2 refills | Status: DC

## 2022-03-18 NOTE — Progress Notes (Signed)
Nursing Pain Medication Assessment:  Safety precautions to be maintained throughout the outpatient stay will include: orient to surroundings, keep bed in low position, maintain call bell within reach at all times, provide assistance with transfer out of bed and ambulation.  Medication Inspection Compliance: Pill count conducted under aseptic conditions, in front of the patient. Neither the pills nor the bottle was removed from the patient's sight at any time. Once count was completed pills were immediately returned to the patient in their original bottle.  Medication: Buprenorphine (Suboxone) Pill/Patch Count:  2 of 4 patches remain Pill/Patch Appearance: Markings consistent with prescribed medication Bottle Appearance: Standard pharmacy container. Clearly labeled. Filled Date: 06 / 30 / 2023 Last Medication intake:   03/13/22

## 2022-03-18 NOTE — Progress Notes (Signed)
PROVIDER NOTE: Information contained herein reflects review and annotations entered in association with encounter. Interpretation of such information and data should be left to medically-trained personnel. Information provided to patient can be located elsewhere in the medical record under "Patient Instructions". Document created using STT-dictation technology, any transcriptional errors that may result from process are unintentional.    Patient: Anita Gibbs  Service Category: E/M  Provider: Gillis Santa, MD  DOB: 03/31/1971  DOS: 03/18/2022  Specialty: Interventional Pain Management  MRN: [id number]  Setting: Ambulatory outpatient  PCP: McLean-Scocuzza, Nino Glow, MD  Type: Established Patient    Referring Provider: Orland Gibbs *  Location: Office  Delivery: Face-to-face     HPI  [name], a 51 y.o. year old female, is here today because of her Right lumbar radiculitis [M54.16]. Anita Gibbs primary complain today is Back Pain (lower)  Last encounter: [name] was on 01/29/22 Pertinent problems: Anita Gibbs has Lumbar facet syndrome (Bilateral) (R>L); Spondylosis without myelopathy or radiculopathy, lumbosacral region; and Morbid obesity with BMI of 45.0-49.9, adult (Anita Gibbs) on their pertinent problem list. Pain Assessment: Severity of Chronic pain is reported as a 8 /10. Location: Back Lower/NEW "for the past month, cramping pain from left groin around lower back and down right leg to foot". Onset: More than a month ago. Quality: Cramping, Constant, Stabbing. Timing: Constant. Modifying factor(s): meds, heating pad. Vitals:  height is _0  (1.6 m) and weight is 243 lb (110.2 kg). Her temperature is 97.2 F (36.2 C) (abnormal). Her blood pressure is 124/70 and her pulse is 64. Her respiration is 16 and oxygen saturation is 100%.   Reason for encounter: medication management.    Patient presents today for medication management as well as increased low back pain  with occasional radiation into her left groin and radiation down her right leg to her feet. She is complaining of increased low back pain with radiation into bilateral legs, right greater than left in a dermatomal fashion.  Previous lumbar ESI was on 01/01/2022.  I recommend that patient wait at least 3 months from her previous injection which would be April 03, 2022.  She is only had 1 injection thus far this year so reasonable to repeat.  I reviewed stretching and strengthening exercises that she could utilize at home she does have a pain flare.   Pharmacotherapy Assessment  Analgesic: Butrans 7.5 mcg an hour  Monitoring: [place] PMP: PDMP reviewed during this encounter.       Pharmacotherapy: No side-effects or adverse reactions reported. Compliance: No problems identified. Effectiveness: Clinically acceptable.  Anita Patience, RN  03/18/2022 10:12 AM  Sign when Signing Visit Nursing Pain Medication Assessment:  Safety precautions to be maintained throughout the outpatient stay will include: orient to surroundings, keep bed in low position, maintain call bell within reach at all times, provide assistance with transfer out of bed and ambulation.  Medication Inspection Compliance: Pill count conducted under aseptic conditions, in front of the patient. Neither the pills nor the bottle was removed from the patient's sight at any time. Once count was completed pills were immediately returned to the patient in their original bottle.  Medication: Buprenorphine (Suboxone) Pill/Patch Count:  2 of 4 patches remain Pill/Patch Appearance: Markings consistent with prescribed medication Bottle Appearance: Standard pharmacy container. Clearly labeled. Filled Date: 06 / 30 / 2023 Last Medication intake:   03/13/22   UDS:  Summary  Date Value Ref Range Status  02/01/2020 Note  Final  Comment:    ==================================================================== [employer] Select 13  (MW) ==================================================================== Test                             Result       Flag       Units Drug Present not Declared for Prescription Verification   Carboxy-THC                    20           UNEXPECTED ng/mg creat    Carboxy-THC is a metabolite of tetrahydrocannabinol (THC). Source of    THC is most commonly herbal marijuana or marijuana-based products,    but THC is also present in a scheduled prescription medication.    Trace amounts of THC can be present in hemp and cannabidiol (CBD)    products. This test is not intended to distinguish between delta-9-    tetrahydrocannabinol, the predominant form of THC in most herbal or    marijuana-based products, and delta-8-tetrahydrocannabinol. Drug Absent but Declared for Prescription Verification   Oxycodone                      Not Detected UNEXPECTED ng/mg creat ==================================================================== Test                      Result    Flag   Units      Ref Range   Creatinine              230              mg/dL      >=20 ==================================================================== Declared Medications:  The flagging and interpretation on this report are based on the  following declared medications.  Unexpected results may arise from  inaccuracies in the declared medications.  **Note: The testing scope of this panel includes these medications:  Oxycodone (Roxicodone)  **Note: The testing scope of this panel does not include the  following reported medications:  Albuterol (Ventolin HFA)  Amlodipine (Norvasc)  Atorvastatin (Lipitor)  Cetirizine  Diphenhydramine (Benadryl)  Epinephrine (EpiPen)  Fluticasone (Flonase)  Gabapentin (Neurontin)  Hydrochlorothiazide  Losartan (Cozaar)  Meloxicam (Mobic)  Methocarbamol (Robaxin)  Montelukast (Singulair)  Norethindrone (Aygestin)  Pantoprazole (Protonix)  Vitamin  D3 ==================================================================== For clinical consultation, please call [phone]. ====================================================================      ROS  Constitutional: Denies any fever or chills Gastrointestinal: No reported hemesis, hematochezia, vomiting, or acute GI distress Musculoskeletal:  Right & left low back pain with radiation to b/l legs  Neurological: No reported episodes of acute onset apraxia, aphasia, dysarthria, agnosia, amnesia, paralysis, loss of coordination, or loss of consciousness  Medication Review  Cyanocobalamin, DULoxetine, EPINEPHrine, Lumbar Back Brace/Support Pad, Olopatadine HCl, Vitamin D-3, albuterol, amLODipine, atorvastatin, buprenorphine, cetirizine, dicyclomine, diphenhydrAMINE, fluticasone, furosemide, hydrochlorothiazide, hydrocortisone, losartan, meloxicam, methocarbamol, metroNIDAZOLE, montelukast, pantoprazole, potassium chloride SA, and pregabalin  History Review  Allergy: Ms. Esteve is allergic to other, pineapple, shellfish allergy, strawberry (diagnostic), and tomato. Drug: Ms. Panik  reports no history of drug use. Alcohol:  reports current alcohol use of about 3.0 standard drinks of alcohol per week. Tobacco:  reports that she has never smoked. She has never used smokeless tobacco. Social: Ms. Bares  reports that she has never smoked. She has never used smokeless tobacco. She reports current alcohol use of about 3.0 standard drinks of alcohol per week. She reports that she does not use  drugs. Medical:  has a past medical history of Arthritis, Carpal boss, right, Carpal tunnel syndrome, bilateral, Headache, Hives (09/05/2020), Hypertension, and Seasonal asthma (11/29/2018). Surgical: Ms. Maka  has a past surgical history that includes Knee arthroscopy (Right); Anterior cervical decomp/discectomy fusion (N/A, 07/01/2017); Abdominal hysterectomy (10/17/2020); and Carpal tunnel release  (Bilateral). Family: family history includes [name] in her mother; Arthritis in her father, mother, and sister; Breast cancer in her maternal aunt and paternal aunt; COPD in her brother and son; Cancer in her brother; Depression in her son; Diabetes in her mother; Early death in her brother; Hearing loss in her father; Hyperlipidemia in her mother; Hypertension in her father, mother, and sister; Kidney disease in her father; Lymphoma in her father; Sleep apnea in her daughter.  Laboratory Chemistry Profile   Renal Lab Results  Component Value Date   BUN 18 02/19/2022   CREATININE 0.94 54/98/2641   BCR NOT APPLICABLE 58/30/9407   GFR 71.70 02/19/2022   GFRAA 76 09/07/2018   GFRNONAA 66 09/07/2018    Hepatic Lab Results  Component Value Date   AST 14 08/29/2021   ALT 14 08/29/2021   ALBUMIN 3.9 08/29/2021   ALKPHOS 66 08/29/2021    Electrolytes Lab Results  Component Value Date   NA 139 02/19/2022   K 3.8 02/19/2022   CL 102 02/19/2022   CALCIUM 9.4 02/19/2022   MG 2.2 09/07/2018    Bone Lab Results  Component Value Date   VD25OH 54.97 01/24/2021   25OHVITD1 8.1 (L) 09/07/2018   25OHVITD2 <1.0 09/07/2018   25OHVITD3 8.1 09/07/2018    Inflammation (CRP: Acute Phase) (ESR: Chronic Phase) Lab Results  Component Value Date   CRP <1.0 01/24/2021   ESRSEDRATE 37 (H) 01/24/2021         Note: Above Lab results reviewed.  Physical Exam  General appearance: Well nourished, well developed, and well hydrated. In no apparent acute distress Mental status: Alert, oriented x 3 (person, place, & time)       Respiratory: No evidence of acute respiratory distress Eyes: PERLA Vitals: BP 124/70   Pulse 64   Temp (!) 97.2 F (36.2 C)   Resp 16   Ht _0  (1.6 m)   Wt 243 lb (110.2 kg)   LMP  (LMP Unknown)   SpO2 100%   BMI 43.05 kg/m  BMI: Estimated body mass index is 43.05 kg/m as calculated from the following:   Height as of this encounter: _1  (1.6 m).   Weight as of  this encounter: 243 lb (110.2 kg). Ideal: Ideal body weight: 52.4 kg (115 lb 8.3 oz) Adjusted ideal body weight: 75.5 kg (166 lb 8.2 oz)  Lumbar Spine Area Exam  Skin & Axial Inspection: No masses, redness, or swelling Alignment: Symmetrical Functional ROM: Pain restricted ROM       Stability: No instability detected Muscle Tone/Strength: Functionally intact. No obvious neuro-muscular anomalies detected. Sensory (Neurological): Dermatomal L4-L5, right greater than left   Lower Extremity Exam      Side: Right lower extremity   Side: Left lower extremity  Stability: No instability observed           Stability: No instability observed          Skin & Extremity Inspection: Skin color, temperature, and hair growth are WNL. No peripheral edema or cyanosis. No masses, redness, swelling, asymmetry, or associated skin lesions. No contractures.   Skin & Extremity Inspection: Skin color, temperature, and hair growth are WNL. No peripheral  edema or cyanosis. No masses, redness, swelling, asymmetry, or associated skin lesions. No contractures.  Functional ROM: Unrestricted ROM                   Functional ROM: Unrestricted ROM                  Muscle Tone/Strength: Functionally intact. No obvious neuro-muscular anomalies detected.   Muscle Tone/Strength: Functionally intact. No obvious neuro-muscular anomalies detected.  Sensory (Neurological): Unimpaired         Sensory (Neurological): Unimpaired        DTR: Patellar: deferred today Achilles: deferred today Plantar: deferred today   DTR: Patellar: deferred today Achilles: deferred today Plantar: deferred today  Palpation: No palpable anomalies   Palpation: No palpable anomalies    Assessment   Status Diagnosis  Having a Flare-up Having a Flare-up Having a Flare-up 1. Lumbar radiculitis (L5 dermatome)    2. Neurogenic pain   3. Chronic lower extremity pain (Fourth Area of Pain) (Bilateral) (R>L)   4. Chronic pain syndrome       Plan of  Care    [name] has a current medication list which includes the following long-term medication(s): albuterol, amlodipine, atorvastatin, cetirizine, dicyclomine, diphenhydramine, duloxetine, fluticasone, furosemide, hydrochlorothiazide, losartan, methocarbamol, montelukast, pantoprazole, potassium chloride sa, and pregabalin.  Pharmacotherapy (Medications Ordered): Meds ordered this encounter  Medications   buprenorphine (BUTRANS) 7.5 MCG/HR    Sig: Place 1 patch onto the skin once a week.    Dispense:  4 patch    Refill:  2    Chronic Pain: STOP Act (Not applicable) Fill 1 day early if closed on refill date. Avoid benzodiazepines within 8 hours of opioids   Orders Placed This Encounter  Procedures   Lumbar Epidural Injection    Standing Status:   Future    Standing Expiration Date:   04/18/2022    Scheduling Instructions:     Procedure: Interlaminar Lumbar Epidural Steroid injection (LESI)            Laterality: Midline     Sedation:without     Timeframe: ASAA    Order Specific Question:   Where will this procedure be performed?    Answer:   [facility]     Follow-up plan:   Return in about 13 days (around 03/31/2022) for L-ESI , in clinic [facility].    Recent Visits Date Type Provider Dept  01/29/22 Office Visit Anita Santa, MD [facility]  01/01/22 Procedure visit Anita Santa, MD [facility]  12/24/21 Office Visit Anita Santa, MD [facility]  Showing recent visits within past 90 days and meeting all other requirements Today's Visits Date Type Provider Dept  03/18/22 Office Visit Anita Santa, MD [facility]  Showing today's visits and meeting all other requirements Future Appointments Date Type Provider Dept  03/31/22 Appointment Anita Santa, MD [facility]  Showing future appointments within next 90 days and meeting all other requirements  I discussed the assessment and treatment plan with the  patient. The patient was provided an opportunity to ask questions and all were answered. The patient agreed with the plan and demonstrated an understanding of the instructions.  Patient advised to call back or seek an in-person evaluation if the symptoms or condition worsens.  Duration of encounter: 48mnutes.  Note by: BGillis Santa MD Date: 03/18/2022; Time: 10:38 AM

## 2022-03-18 NOTE — Patient Instructions (Addendum)

## 2022-03-31 ENCOUNTER — Ambulatory Visit
Admission: RE | Admit: 2022-03-31 | Discharge: 2022-03-31 | Disposition: A | Discharge status: Home / Self Care | Payer: BC Managed Care – PPO | Source: Ambulatory Visit | Attending: Student in an Organized Health Care Education/Training Program | Admitting: Student in an Organized Health Care Education/Training Program

## 2022-03-31 ENCOUNTER — Encounter: Payer: Self-pay | Admitting: Student in an Organized Health Care Education/Training Program

## 2022-03-31 ENCOUNTER — Ambulatory Visit
Payer: BC Managed Care – PPO | Attending: Student in an Organized Health Care Education/Training Program | Admitting: Student in an Organized Health Care Education/Training Program

## 2022-03-31 VITALS — BP 145/82 | HR 68 | Temp 97.9°F | Resp 18 | Ht 63.0 in | Wt 243.0 lb

## 2022-03-31 DIAGNOSIS — M79604 Pain in right leg: Secondary | ICD-10-CM | POA: Diagnosis present

## 2022-03-31 DIAGNOSIS — M5416 Radiculopathy, lumbar region: Secondary | ICD-10-CM | POA: Diagnosis not present

## 2022-03-31 DIAGNOSIS — M79605 Pain in left leg: Secondary | ICD-10-CM | POA: Diagnosis present

## 2022-03-31 DIAGNOSIS — G8929 Other chronic pain: Secondary | ICD-10-CM | POA: Diagnosis present

## 2022-03-31 MED ORDER — IOHEXOL 180 MG/ML  SOLN
10.0000 mL | Freq: Once | INTRAMUSCULAR | Status: AC
Start: 2022-03-31 — End: 2022-03-31
  Administered 2022-03-31: 10 mL via EPIDURAL
  Filled 2022-03-31: qty 20

## 2022-03-31 MED ORDER — SODIUM CHLORIDE 0.9% FLUSH
2.0000 mL | Freq: Once | INTRAVENOUS | Status: AC
Start: 2022-03-31 — End: 2022-03-31
  Administered 2022-03-31: 2 mL

## 2022-03-31 MED ORDER — DEXAMETHASONE SODIUM PHOSPHATE 10 MG/ML IJ SOLN
10.0000 mg | Freq: Once | INTRAMUSCULAR | Status: AC
  Administered 2022-03-31: 10 mg
  Filled 2022-03-31: qty 1

## 2022-03-31 MED ORDER — ROPIVACAINE HCL 2 MG/ML IJ SOLN
2.0000 mL | Freq: Once | INTRAMUSCULAR | Status: AC
  Administered 2022-03-31: 2 mL via EPIDURAL
  Filled 2022-03-31: qty 20

## 2022-03-31 MED ORDER — LIDOCAINE HCL 2 % IJ SOLN
20.0000 mL | Freq: Once | INTRAMUSCULAR | Status: AC
  Administered 2022-03-31: 100 mg
  Filled 2022-03-31: qty 40

## 2022-03-31 NOTE — Progress Notes (Signed)
Safety precautions to be maintained throughout the outpatient stay will include: orient to surroundings, keep bed in low position, maintain call bell within reach at all times, provide assistance with transfer out of bed and ambulation.  

## 2022-03-31 NOTE — Patient Instructions (Signed)
Pain Management Discharge Instructions  General Discharge Instructions :  If you need to reach your doctor call: Monday-Friday 8:00 am - 4:00 pm at [phone] or toll free [phone].  After clinic hours [phone] to have operator reach doctor.  Bring all of your medication bottles to all your appointments in the pain clinic.  To cancel or reschedule your appointment with Pain Management please remember to call 24 hours in advance to avoid a fee.  Refer to the educational materials which you have been given on: General Risks, I had my Procedure. Discharge Instructions, Post Sedation.  Post Procedure Instructions:  The drugs you were given will stay in your system until tomorrow, so for the next 24 hours you should not drive, make any legal decisions or drink any alcoholic beverages.  You may eat anything you prefer, but it is better to start with liquids then soups and crackers, and gradually work up to solid foods.  Please notify your doctor immediately if you have any unusual bleeding, trouble breathing or pain that is not related to your normal pain.  Depending on the type of procedure that was done, some parts of your body may feel week and/or numb.  This usually clears up by tonight or the next day.  Walk with the use of an assistive device or accompanied by an adult for the 24 hours.  You may use ice on the affected area for the first 24 hours.  Put ice in a Ziploc bag and cover with a towel and place against area 15 minutes on 15 minutes off.  You may switch to heat after 24 hours.Epidural Steroid Injection Patient Information  Description: The epidural space surrounds the nerves as they exit the spinal cord.  In some patients, the nerves can be compressed and inflamed by a bulging disc or a tight spinal canal (spinal stenosis).  By injecting steroids into the epidural space, we can bring irritated nerves into direct contact with a potentially helpful medication.  These  steroids act directly on the irritated nerves and can reduce swelling and inflammation which often leads to decreased pain.  Epidural steroids may be injected anywhere along the spine and from the neck to the low back depending upon the location of your pain.   After numbing the skin with local anesthetic (like Novocaine), a small needle is passed into the epidural space slowly.  You may experience a sensation of pressure while this is being done.  The entire block usually last less than 10 minutes.  Conditions which may be treated by epidural steroids:  Low back and leg pain Neck and arm pain Spinal stenosis Post-laminectomy syndrome Herpes zoster (shingles) pain Pain from compression fractures  Preparation for the injection:  Do not eat any solid food or dairy products within 8 hours of your appointment.  You may drink clear liquids up to 3 hours before appointment.  Clear liquids include water, black coffee, juice or soda.  No milk or cream please. You may take your regular medication, including pain medications, with a sip of water before your appointment  Diabetics should hold regular insulin (if taken separately) and take 1/2 normal NPH dos the morning of the procedure.  Carry some sugar containing items with you to your appointment. A driver must accompany you and be prepared to drive you home after your procedure.  Bring all your current medications with your. An IV may be inserted and sedation may be given at the discretion of the physician.     A blood pressure cuff, EKG and other monitors will often be applied during the procedure.  Some patients may need to have extra oxygen administered for a short period. You will be asked to provide medical information, including your allergies, prior to the procedure.  We must know immediately if you are taking blood thinners (like Coumadin/Warfarin)  Or if you are allergic to IV iodine contrast (dye). We must know if you could possible be  pregnant.  Possible side-effects: Bleeding from needle site Infection (rare, may require surgery) Nerve injury (rare) Numbness & tingling (temporary) Difficulty urinating (rare, temporary) Spinal headache ( a headache worse with upright posture) Light -headedness (temporary) Pain at injection site (several days) Decreased blood pressure (temporary) Weakness in arm/leg (temporary) Pressure sensation in back/neck (temporary)  Call if you experience: Fever/chills associated with headache or increased back/neck pain. Headache worsened by an upright position. New onset weakness or numbness of an extremity below the injection site Hives or difficulty breathing (go to the emergency room) Inflammation or drainage at the infection site Severe back/neck pain Any new symptoms which are concerning to you  Please note:  Although the local anesthetic injected can often make your back or neck feel good for several hours after the injection, the pain will likely return.  It takes 3-7 days for steroids to work in the epidural space.  You may not notice any pain relief for at least that one week.  If effective, we will often do a series of three injections spaced 3-6 weeks apart to maximally decrease your pain.  After the initial series, we generally will wait several months before considering a repeat injection of the same type.  If you have any questions, please call [phone]  [facility] 

## 2022-03-31 NOTE — Progress Notes (Signed)
PROVIDER NOTE: Information contained herein reflects review and annotations entered in association with encounter. Interpretation of such information and data should be left to medically-trained personnel. Information provided to patient can be located elsewhere in the medical record under "Patient Instructions". Document created using STT-dictation technology, any transcriptional errors that may result from process are unintentional.    Patient: Anita Gibbs  Service Category: Procedure  Provider: Gillis Santa, MD  DOB: [birth year]  DOS: 03/31/2022  Location: [facility]  MRN: [id number]  Setting: Ambulatory - outpatient  Referring Provider: McLean-Scocuzza, Olivia Mackie *  Type: Established Patient  Specialty: Interventional Pain Management  PCP: McLean-Scocuzza, Nino Glow, MD   Primary Reason for Visit: Interventional Pain Management Treatment. CC: Back Pain (lower)   Procedure:          Anesthesia, Analgesia, Anxiolysis:  Type: Therapeutic Inter-Laminar Epidural Steroid Injection   Region: Lumbar Level: L5-S1 Level. Laterality: Right-Sided         Type: Local Anesthesia  Local Anesthetic: Lidocaine 1-2%  Position: Prone with head of the table was raised to facilitate breathing.   Indications: 1. Lumbar radiculitis (L5 dermatome)    2. Chronic lower extremity pain (Fourth Area of Pain) (Bilateral) (R>L)      Pain Score: Pre-procedure: 8 /10 Post-procedure: 2 /10   Pre-op Assessment:  Anita Gibbs is a 51 y.o. (year old), female patient, seen today for interventional treatment. She  has a past surgical history that includes Knee arthroscopy (Right); Anterior cervical decomp/discectomy fusion (N/A, 07/01/2017); Abdominal hysterectomy (10/17/2020); and Carpal tunnel release (Bilateral). Anita Gibbs has a current medication list which includes the following prescription(s): albuterol, amlodipine, atorvastatin, [START ON 04/21/2022] buprenorphine, cetirizine, vitamin d-3,  cyanocobalamin, dicyclomine, diphenhydramine, duloxetine, lumbar back brace/support pad, epinephrine, fluticasone, furosemide, hydrochlorothiazide, hydrocortisone, losartan, meloxicam, methocarbamol, metronidazole, montelukast, olopatadine hcl, pantoprazole, potassium chloride sa, and pregabalin. Her primarily concern today is the Back Pain (lower)   Initial Vital Signs:  Pulse/HCG Rate: 68ECG Heart Rate: 62 Temp: 97.9 F (36.6 C) Resp: 16 BP: 126/68 SpO2: 100 %  BMI: Estimated body mass index is 43.05 kg/m as calculated from the following:   Height as of this encounter: '5\' 3"'$  (1.6 m).   Weight as of this encounter: 243 lb (110.2 kg).  Risk Assessment: Allergies: Reviewed. She is allergic to other, pineapple, shellfish allergy, strawberry (diagnostic), and tomato.  Allergy Precautions: None required Coagulopathies: Reviewed. None identified.  Blood-thinner therapy: None at this time Active Infection(s): Reviewed. None identified. Anita Gibbs is afebrile  Site Confirmation: Anita Gibbs was asked to confirm the procedure and laterality before marking the site Procedure checklist: Completed Consent: Before the procedure and under the influence of no sedative(s), amnesic(s), or anxiolytics, the patient was informed of the treatment options, risks and possible complications. To fulfill our ethical and legal obligations, as recommended by the American Medical Association's Code of Ethics, I have informed the patient of my clinical impression; the nature and purpose of the treatment or procedure; the risks, benefits, and possible complications of the intervention; the alternatives, including doing nothing; the risk(s) and benefit(s) of the alternative treatment(s) or procedure(s); and the risk(s) and benefit(s) of doing nothing. The patient was provided information about the general risks and possible complications associated with the procedure. These may include, but are not limited to: failure  to achieve desired goals, infection, bleeding, organ or nerve damage, allergic reactions, paralysis, and death. In addition, the patient was informed of those risks and complications associated to Spine-related procedures, such as failure  to decrease pain; infection (i.e.: Meningitis, epidural or intraspinal abscess); bleeding (i.e.: epidural hematoma, subarachnoid hemorrhage, or any other type of intraspinal or peri-dural bleeding); organ or nerve damage (i.e.: Any type of peripheral nerve, nerve root, or spinal cord injury) with subsequent damage to sensory, motor, and/or autonomic systems, resulting in permanent pain, numbness, and/or weakness of one or several areas of the body; allergic reactions; (i.e.: anaphylactic reaction); and/or death. Furthermore, the patient was informed of those risks and complications associated with the medications. These include, but are not limited to: allergic reactions (i.e.: anaphylactic or anaphylactoid reaction(s)); adrenal axis suppression; blood sugar elevation that in diabetics may result in ketoacidosis or comma; water retention that in patients with history of congestive heart failure may result in shortness of breath, pulmonary edema, and decompensation with resultant heart failure; weight gain; swelling or edema; medication-induced neural toxicity; particulate matter embolism and blood vessel occlusion with resultant organ, and/or nervous system infarction; and/or aseptic necrosis of one or more joints. Finally, the patient was informed that Medicine is not an exact science; therefore, there is also the possibility of unforeseen or unpredictable risks and/or possible complications that may result in a catastrophic outcome. The patient indicated having understood very clearly. We have given the patient no guarantees and we have made no promises. Enough time was given to the patient to ask questions, all of which were answered to the patient's satisfaction. Anita Gibbs  has indicated that she wanted to continue with the procedure. Attestation: I, the ordering provider, attest that I have discussed with the patient the benefits, risks, side-effects, alternatives, likelihood of achieving goals, and potential problems during recovery for the procedure that I have provided informed consent. Date  Time: 03/31/2022  8:20 AM  Pre-Procedure Preparation:  Monitoring: As per clinic protocol. Respiration, ETCO2, SpO2, BP, heart rate and rhythm monitor placed and checked for adequate function Safety Precautions: Patient was assessed for positional comfort and pressure points before starting the procedure. Time-out: I initiated and conducted the "Time-out" before starting the procedure, as per protocol. The patient was asked to participate by confirming the accuracy of the "Time Out" information. Verification of the correct person, site, and procedure were performed and confirmed by me, the nursing staff, and the patient. "Time-out" conducted as per Joint Commission's Universal Protocol (UP.01.01.01). Time: 0845  Description of Procedure:          Target Area: The interlaminar space, initially targeting the lower laminar border of the superior vertebral body. Approach: Paramedial approach. Area Prepped: Entire Posterior Lumbar Region DuraPrep (Iodine Povacrylex [0.7% available iodine] and Isopropyl Alcohol, 74% w/w) Safety Precautions: Aspiration looking for blood return was conducted prior to all injections. At no point did we inject any substances, as a needle was being advanced. No attempts were made at seeking any paresthesias. Safe injection practices and needle disposal techniques used. Medications properly checked for expiration dates. SDV (single dose vial) medications used. Description of the Procedure: Protocol guidelines were followed. The procedure needle was introduced through the skin, ipsilateral to the reported pain, and advanced to the target area. Bone was  contacted and the needle walked caudad, until the lamina was cleared. The epidural space was identified using "loss-of-resistance technique" with 2-3 ml of PF-NaCl (0.9% NSS), in a 5cc LOR glass syringe.  Vitals:   03/31/22 0823 03/31/22 0842 03/31/22 0847 03/31/22 0850  BP: 126/68 133/62 (!) 142/87 (!) 145/82  Pulse: 68     Resp: '16 18 18 18  '$ Temp: 97.9 F (36.6  C)     TempSrc: Temporal     SpO2: 100% 98% 100% 98%  Weight: 243 lb (110.2 kg)     Height: '5\' 3"'$  (1.6 m)        Start Time: 0846 hrs. End Time: 0849 hrs.  Materials:  Needle(s) Type: Epidural needle Gauge: 17G Length: 5-in Medication(s): Please see orders for medications and dosing details. 6 cc solution made of 3 cc of preservative-free saline, 2 cc of 0.2% ropivacaine, 1 cc of Decadron 10 mg/cc.  Imaging Guidance (Spinal):          Type of Imaging Technique: Fluoroscopy Guidance (Spinal) Indication(s): Assistance in needle guidance and placement for procedures requiring needle placement in or near specific anatomical locations not easily accessible without such assistance. Exposure Time: Please see nurses notes. Contrast: Before injecting any contrast, we confirmed that the patient did not have an allergy to iodine, shellfish, or radiological contrast. Once satisfactory needle placement was completed at the desired level, radiological contrast was injected. Contrast injected under live fluoroscopy. No contrast complications. See chart for type and volume of contrast used. Fluoroscopic Guidance: I was personally present during the use of fluoroscopy. "Tunnel Vision Technique" used to obtain the best possible view of the target area. Parallax error corrected before commencing the procedure. "Direction-depth-direction" technique used to introduce the needle under continuous pulsed fluoroscopy. Once target was reached, antero-posterior, oblique, and lateral fluoroscopic projection used confirm needle placement in all planes.  Images permanently stored in EMR. Interpretation: I personally interpreted the imaging intraoperatively. Adequate needle placement confirmed in multiple planes. Appropriate spread of contrast into desired area was observed. No evidence of afferent or efferent intravascular uptake. No intrathecal or subarachnoid spread observed. Permanent images saved into the patient's record.   Post-operative Assessment:  Post-procedure Vital Signs:  Pulse/HCG Rate: 6866 Temp: 97.9 F (36.6 C) Resp: 18 BP: (!) 145/82 SpO2: 98 %  EBL: None  Complications: No immediate post-treatment complications observed by team, or reported by patient.  Note: The patient tolerated the entire procedure well. A repeat set of vitals were taken after the procedure and the patient was kept under observation following institutional policy, for this type of procedure. Post-procedural neurological assessment was performed, showing return to baseline, prior to discharge. The patient was provided with post-procedure discharge instructions, including a section on how to identify potential problems. Should any problems arise concerning this procedure, the patient was given instructions to immediately contact us, at any time, without hesitation. In any case, we plan to contact the patient by telephone for a follow-up status report regarding this interventional procedure.  Comments:  No additional relevant information.  5 out of 5 strength bilateral lower extremity: Plantar flexion, dorsiflexion, knee flexion, knee extension.   Plan of Care  Orders:  Orders Placed This Encounter  Procedures   DG PAIN CLINIC C-ARM 1-60 MIN NO REPORT    Intraoperative interpretation by procedural physician at [place].    Standing Status:   Standing    Number of Occurrences:   1    Order Specific Question:   Reason for exam:    Answer:   Assistance in needle guidance and placement for procedures requiring needle placement in or near  specific anatomical locations not easily accessible without such assistance.    Medications ordered for procedure: Meds ordered this encounter  Medications   iohexol (OMNIPAQUE) 180 MG/ML injection 10 mL    Must be Myelogram-compatible. If not available, you may substitute with a water-soluble, non-ionic, hypoallergenic,  myelogram-compatible radiological contrast medium.   lidocaine (XYLOCAINE) 2 % (with pres) injection 400 mg   sodium chloride flush (NS) 0.9 % injection 2 mL   ropivacaine (PF) 2 mg/mL (0.2%) (NAROPIN) injection 2 mL   dexamethasone (DECADRON) injection 10 mg    Medications administered: We administered iohexol, lidocaine, sodium chloride flush, ropivacaine (PF) 2 mg/mL (0.2%), and dexamethasone.  See the medical record for exact dosing, route, and time of administration.  Follow-up plan:   Return in about 4 weeks (around 04/28/2022) for Post Procedure Evaluation, virtual.    Recent Visits Date Type Provider Dept  03/18/22 Office Visit Gillis Santa, MD [facility]  01/29/22 Office Visit Gillis Santa, MD [facility]  01/01/22 Procedure visit Gillis Santa, MD [facility]  Showing recent visits within past 90 days and meeting all other requirements Today's Visits Date Type Provider Dept  03/31/22 Procedure visit Gillis Santa, MD [facility]  Showing today's visits and meeting all other requirements Future Appointments Date Type Provider Dept  04/30/22 Appointment Gillis Santa, MD [facility]  Showing future appointments within next 90 days and meeting all other requirements  Disposition: Discharge home  Discharge (Date  Time): 03/31/2022; 0900 hrs.   Primary Care Physician: McLean-Scocuzza, Nino Glow, MD Location: [facility] Note by: Gillis Santa, MD Date: 03/31/2022; Time: 8:57 AM  Disclaimer:  Medicine is not an exact science. The only guarantee in medicine is that nothing is  guaranteed. It is important to note that the decision to proceed with this intervention was based on the information collected from the patient. The Data and conclusions were drawn from the patient's questionnaire, the interview, and the physical examination. Because the information was provided in large part by the patient, it cannot be guaranteed that it has not been purposely or unconsciously manipulated. Every effort has been made to obtain as much relevant data as possible for this evaluation. It is important to note that the conclusions that lead to this procedure are derived in large part from the available data. Always take into account that the treatment will also be dependent on availability of resources and existing treatment guidelines, considered by other Pain Management Practitioners as being common knowledge and practice, at the time of the intervention. For Medico-Legal purposes, it is also important to point out that variation in procedural techniques and pharmacological choices are the acceptable norm. The indications, contraindications, technique, and results of the above procedure should only be interpreted and judged by a Board-Certified Interventional Pain Specialist with extensive familiarity and expertise in the same exact procedure and technique.

## 2022-04-01 ENCOUNTER — Telehealth: Payer: Self-pay

## 2022-04-01 NOTE — Telephone Encounter (Signed)
Post procedure phone call. Patient states she is doing ok but has a headache.  Instructed patient to call back if headache does not subside.

## 2022-04-07 ENCOUNTER — Encounter: Payer: Self-pay | Admitting: Student in an Organized Health Care Education/Training Program

## 2022-04-18 ENCOUNTER — Telehealth: Payer: Self-pay

## 2022-04-18 ENCOUNTER — Encounter: Payer: Self-pay | Admitting: Internal Medicine

## 2022-04-18 NOTE — Telephone Encounter (Signed)
LMOM for pt to CB to get scheduled in regards to her [place] (supporting Nino Glow McLean-Scocuzza, MD) 1 hour ago (8:55 AM)    Anita Gibbs Dr Olivia Mackie can we set up a appt to talk about these shots that suppose to help with weight lose.  Attachments  [id number]

## 2022-04-30 ENCOUNTER — Encounter: Payer: Self-pay | Admitting: Internal Medicine

## 2022-04-30 ENCOUNTER — Telehealth (INDEPENDENT_AMBULATORY_CARE_PROVIDER_SITE_OTHER): Payer: BC Managed Care – PPO | Admitting: Internal Medicine

## 2022-04-30 ENCOUNTER — Encounter: Payer: Self-pay | Admitting: Student in an Organized Health Care Education/Training Program

## 2022-04-30 ENCOUNTER — Ambulatory Visit
Payer: BC Managed Care – PPO | Attending: Student in an Organized Health Care Education/Training Program | Admitting: Student in an Organized Health Care Education/Training Program

## 2022-04-30 VITALS — Ht 63.0 in | Wt 243.0 lb

## 2022-04-30 DIAGNOSIS — E785 Hyperlipidemia, unspecified: Secondary | ICD-10-CM

## 2022-04-30 DIAGNOSIS — Z6841 Body Mass Index (BMI) 40.0 and over, adult: Secondary | ICD-10-CM | POA: Diagnosis not present

## 2022-04-30 DIAGNOSIS — R7303 Prediabetes: Secondary | ICD-10-CM | POA: Diagnosis not present

## 2022-04-30 DIAGNOSIS — M47812 Spondylosis without myelopathy or radiculopathy, cervical region: Secondary | ICD-10-CM

## 2022-04-30 DIAGNOSIS — I10 Essential (primary) hypertension: Secondary | ICD-10-CM

## 2022-04-30 DIAGNOSIS — M47816 Spondylosis without myelopathy or radiculopathy, lumbar region: Secondary | ICD-10-CM | POA: Diagnosis not present

## 2022-04-30 HISTORY — DX: Hyperlipidemia, unspecified: E78.5

## 2022-04-30 HISTORY — DX: Body Mass Index (BMI) 40.0 and over, adult: Z684

## 2022-04-30 MED ORDER — WEGOVY 2.4 MG/0.75ML ~~LOC~~ SOAJ
2.4000 mg | SUBCUTANEOUS | 5 refills | Status: DC
  Filled 2022-04-30: qty 3, fill #0

## 2022-04-30 MED ORDER — WEGOVY 1.7 MG/0.75ML ~~LOC~~ SOAJ: 1.7000 mg | SUBCUTANEOUS | 0 refills | Status: DC

## 2022-04-30 MED ORDER — WEGOVY 0.25 MG/0.5ML ~~LOC~~ SOAJ
0.2500 mg | SUBCUTANEOUS | 0 refills | Status: DC
  Filled 2022-04-30 – 2022-05-14 (×2): qty 2, 28d supply, fill #0

## 2022-04-30 MED ORDER — SAXENDA 18 MG/3ML ~~LOC~~ SOPN
0.6000 mg | PEN_INJECTOR | Freq: Every day | SUBCUTANEOUS | 11 refills | Status: DC
  Filled 2022-04-30: qty 15, 30d supply, fill #0

## 2022-04-30 MED ORDER — WEGOVY 0.25 MG/0.5ML ~~LOC~~ SOAJ: 0.2500 mg | SUBCUTANEOUS | 0 refills | Status: DC

## 2022-04-30 MED ORDER — WEGOVY 1.7 MG/0.75ML ~~LOC~~ SOAJ
1.7000 mg | SUBCUTANEOUS | 0 refills | Status: DC
  Filled 2022-04-30: qty 3, fill #0

## 2022-04-30 MED ORDER — INSULIN PEN NEEDLE 30G X 8 MM MISC: 1.0000 | 3 refills | Status: DC | PRN

## 2022-04-30 MED ORDER — SAXENDA 18 MG/3ML ~~LOC~~ SOPN: 0.6000 mg | PEN_INJECTOR | Freq: Every day | SUBCUTANEOUS | 11 refills | Status: DC

## 2022-04-30 MED ORDER — WEGOVY 2.4 MG/0.75ML ~~LOC~~ SOAJ: 2.4000 mg | SUBCUTANEOUS | 5 refills | Status: DC

## 2022-04-30 MED ORDER — WEGOVY 1 MG/0.5ML ~~LOC~~ SOAJ: 1.0000 mg | SUBCUTANEOUS | 0 refills | Status: DC

## 2022-04-30 MED ORDER — WEGOVY 1 MG/0.5ML ~~LOC~~ SOAJ
1.0000 mg | SUBCUTANEOUS | 0 refills | Status: DC
  Filled 2022-04-30: qty 2, fill #0

## 2022-04-30 MED ORDER — INSULIN PEN NEEDLE 30G X 8 MM MISC
1.0000 | 3 refills | Status: DC | PRN
  Filled 2022-04-30: qty 90, fill #0

## 2022-04-30 MED ORDER — WEGOVY 0.5 MG/0.5ML ~~LOC~~ SOAJ
0.5000 mg | SUBCUTANEOUS | 0 refills | Status: DC
  Filled 2022-04-30: qty 2, fill #0

## 2022-04-30 MED ORDER — WEGOVY 0.5 MG/0.5ML ~~LOC~~ SOAJ: 0.5000 mg | SUBCUTANEOUS | 0 refills | Status: DC

## 2022-04-30 NOTE — Progress Notes (Addendum)
Virtual Visit via Video Note  I connected with Anita Gibbs   on 04/30/22 at  2:00 PM EDT by a video enabled telemedicine application and verified that I am speaking with the correct person using two identifiers.  Location patient:  Location provider:work or home office Persons participating in the virtual visit: patient, provider  I discussed the limitations and requested verbal permission for telemedicine visit. The patient expressed understanding and agreed to proceed.   HPI:  Acute telemedicine visit for : -weight loss BMI 43.06 htn, prediabetes, hld she wants to start wt loss medication   -Pertinent past medical history: see below -Pertinent medication allergies: Allergies  Allergen Reactions   Other Hives    White and red sauces   Pineapple     Swelling    Shellfish Allergy Hives   Strawberry (Diagnostic) Hives   Tomato Hives    cherry   -COVID-19 vaccine status:  Immunization History  Administered Date(s) Administered   Influenza,inj,Quad PF,6+ Mos 05/31/2018, 08/15/2019, 08/03/2020, 07/31/2021   PFIZER(Purple Top)SARS-COV-2 Vaccination 01/24/2020, 02/14/2020, 09/03/2020   Pfizer Covid-19 Vaccine Bivalent Booster 57yr & up 09/12/2021   Tdap 10/13/2019     ROS: See pertinent positives and negatives per HPI.  Past Medical History:  Diagnosis Date   Arthritis    Carpal boss, right    Carpal tunnel syndrome, bilateral    Headache    Hives 09/05/2020   Hypertension    Seasonal asthma 11/29/2018    Past Surgical History:  Procedure Laterality Date   ABDOMINAL HYSTERECTOMY  10/17/2020   [facility]   ANTERIOR CERVICAL DECOMP/DISCECTOMY FUSION N/A 07/01/2017   Procedure: ANTERIOR CERVICAL DECOMPRESSION/DISCECTOMY FUSION 3 LEVELS;  Surgeon: YMeade Maw MD;  Location: [facility] ORS;  Service: Neurosurgery;  Laterality: N/A;   CARPAL TUNNEL RELEASE Bilateral    KNEE ARTHROSCOPY Right      Current Outpatient Medications:    albuterol (VENTOLIN HFA) 108 (90  Base) MCG/ACT inhaler, Inhale 2 puffs into the lungs every 6 (six) hours as needed for wheezing or shortness of breath., Disp: 1 each, Rfl: 11   amLODipine (NORVASC) 5 MG tablet, Take 1 tablet (5 mg total) by mouth daily., Disp: 90 tablet, Rfl: 3   atorvastatin (LIPITOR) 10 MG tablet, MWF at night, Disp: 90 tablet, Rfl: 3   buprenorphine (BUTRANS) 7.5 MCG/HR, Place 1 patch onto the skin once a week., Disp: 4 patch, Rfl: 2   cetirizine (ZYRTEC ALLERGY) 10 MG tablet, Take 1 tablet (10 mg total) by mouth at bedtime as needed., Disp: 90 tablet, Rfl: 3   Cholecalciferol (VITAMIN D-3) 125 MCG (5000 UT) TABS, Take by mouth daily., Disp: , Rfl:    Cyanocobalamin (B-12 PO), Take by mouth., Disp: , Rfl:    dicyclomine (BENTYL) 20 MG tablet, Take 1 tablet (20 mg total) by mouth 4 (four) times daily -  before meals and at bedtime., Disp: 120 tablet, Rfl: 0   diphenhydrAMINE (BENADRYL) 25 mg capsule, Take 25 mg by mouth every 6 (six) hours as needed (for allergies.)., Disp: , Rfl:    DULoxetine (CYMBALTA) 20 MG capsule, Take 1 capsule (20 mg total) by mouth daily., Disp: 90 capsule, Rfl: 3   Elastic Bandages & Supports (LUMBAR BACK BRACE/SUPPORT PAD) MISC, 1 lumbar back brace for lumbar facet pain, Disp: 1 each, Rfl: 0   EPINEPHrine (AUVI-Q) 0.3 mg/0.3 mL IJ SOAJ injection, Use as directed for severe allergic reaction, Disp: 2 Device, Rfl: 2   fluticasone (FLONASE) 50 MCG/ACT nasal spray, Place 2 sprays into  both nostrils daily., Disp: 16 g, Rfl: 6   furosemide (LASIX) 20 MG tablet, Take 1 tablet (20 mg total) by mouth daily as needed. X 5-7 days, Disp: 30 tablet, Rfl: 11   hydrochlorothiazide (HYDRODIURIL) 25 MG tablet, TAKE 1/2 TABLET EVERY MORNING, Disp: 90 tablet, Rfl: 3   hydrocortisone 2.5 % cream, Apply topically 2 (two) times daily. Prn to eyelids, Disp: 30 g, Rfl: 0   losartan (COZAAR) 100 MG tablet, Take 1 tablet (100 mg total) by mouth daily., Disp: 90 tablet, Rfl: 3   meloxicam (MOBIC) 15 MG tablet,  Take 1 tablet (15 mg total) by mouth every other day as needed., Disp: 45 tablet, Rfl: 11   methocarbamol (ROBAXIN) 750 MG tablet, Take 1 tablet (750 mg total) by mouth every 8 (eight) hours as needed for muscle spasms., Disp: 90 tablet, Rfl: 11   metroNIDAZOLE (FLAGYL) 500 MG tablet, Take 1 tablet (500 mg total) by mouth 2 (two) times daily. With food, Disp: 14 tablet, Rfl: 0   montelukast (SINGULAIR) 10 MG tablet, Take 1 tablet (10 mg total) by mouth at bedtime., Disp: 90 tablet, Rfl: 3   Olopatadine HCl 0.2 % SOLN, Apply 1 drop to eye daily. Both eyes, Disp: 2.5 mL, Rfl: 11   pantoprazole (PROTONIX) 40 MG tablet, TAKE ONE TABLET EVERY DAY 30 MIN BEFORE DINNER, Disp: 90 tablet, Rfl: 3   potassium chloride SA (KLOR-CON M) 20 MEQ tablet, Take 1 tablet (20 mEq total) by mouth daily as needed. With lasix, Disp: 30 tablet, Rfl: 11   pregabalin (LYRICA) 75 MG capsule, Take 1 capsule (75 mg total) by mouth 3 (three) times daily., Disp: 90 capsule, Rfl: 11   Insulin Pen Needle (NOVOFINE) 30G X 8 MM MISC, Inject 10 each into the skin as needed. With saxenda, Disp: 90 each, Rfl: 3   Liraglutide -Weight Management (SAXENDA) 18 MG/3ML SOPN, Inject 0.6 mg into the skin daily. X 1 week then increase to 1.2 qd x 1 week then increase 1.8 qd x 1 week then increase 2.4 qd x 1 week then increase 3 mg qd x 1 week, Disp: 9 mL, Rfl: 11   Semaglutide-Weight Management (WEGOVY) 0.25 MG/0.5ML SOAJ, Inject 0.25 mg into the skin once a week. X 20monththen increase to 0.5, Disp: 2 mL, Rfl: 0   Semaglutide-Weight Management (WEGOVY) 0.5 MG/0.5ML SOAJ, Inject 0.5 mg into the skin once a week. X 1 month, Disp: 2 mL, Rfl: 0   Semaglutide-Weight Management (WEGOVY) 1 MG/0.5ML SOAJ, Inject 1 mg into the skin once a week. X 1 month, Disp: 2 mL, Rfl: 0   Semaglutide-Weight Management (WEGOVY) 1.7 MG/0.75ML SOAJ, Inject 1.7 mg into the skin once a week. X 1 month, Disp: 3 mL, Rfl: 0   Semaglutide-Weight Management (WEGOVY) 2.4 MG/0.75ML  SOAJ, Inject 2.4 mg into the skin once a week., Disp: 3 mL, Rfl: 5  EXAM:  VITALS per patient if applicable:  GENERAL: alert, oriented, appears well and in no acute distress  HEENT: atraumatic, conjunttiva clear, no obvious abnormalities on inspection of external nose and ears  NECK: normal movements of the head and neck  LUNGS: on inspection no signs of respiratory distress, breathing rate appears normal, no obvious gross SOB, gasping or wheezing  CV: no obvious cyanosis  MS: moves all visible extremities without noticeable abnormality  PSYCH/NEURO: pleasant and cooperative, no obvious depression or anxiety, speech and thought processing grossly intact  ASSESSMENT AND PLAN:  Discussed the following assessment and plan:  Primary  hypertension -   Rx saxenda (not covered) or wegovy what is covered by insurance   Plan: Insulin Pen Needle (NOVOFINE) 30G X 8 MM MISC, Liraglutide -Weight Management (SAXENDA) 18 MG/3ML SOPN, Semaglutide-Weight Management (WEGOVY) 0.25 MG/0.5ML SOAJ, Semaglutide-Weight Management (WEGOVY) 0.5 MG/0.5ML SOAJ, Semaglutide-Weight Management (WEGOVY) 1 MG/0.5ML SOAJ, Semaglutide-Weight Management (WEGOVY) 1.7 MG/0.75ML SOAJ, Semaglutide-Weight Management (WEGOVY) 2.4 MG/0.75ML SOAJ, DISCONTINUED: Semaglutide-Weight Management (WEGOVY) 0.25 MG/0.5ML SOAJ, DISCONTINUED: Semaglutide-Weight Management (WEGOVY) 0.5 MG/0.5ML SOAJ, DISCONTINUED: Semaglutide-Weight Management (WEGOVY) 1 MG/0.5ML SOAJ, DISCONTINUED: Semaglutide-Weight Management (WEGOVY) 1.7 MG/0.75ML SOAJ, DISCONTINUED: Semaglutide-Weight Management (WEGOVY) 2.4 MG/0.75ML SOAJ, DISCONTINUED: Liraglutide -Weight Management (SAXENDA) 18 MG/3ML SOPN, DISCONTINUED: Insulin Pen Needle (NOVOFINE) 30G X 8 MM MISC  BMI 40.0-44.9, adult ([place]) - Plan: Insulin Pen Needle (NOVOFINE) 30G X 8 MM MISC, Liraglutide -Weight Management (SAXENDA) 18 MG/3ML SOPN, Semaglutide-Weight Management (WEGOVY) 0.25 MG/0.5ML SOAJ,  Semaglutide-Weight Management (WEGOVY) 0.5 MG/0.5ML SOAJ, Semaglutide-Weight Management (WEGOVY) 1 MG/0.5ML SOAJ, Semaglutide-Weight Management (WEGOVY) 1.7 MG/0.75ML SOAJ, Semaglutide-Weight Management (WEGOVY) 2.4 MG/0.75ML SOAJ, DISCONTINUED: Semaglutide-Weight Management (WEGOVY) 0.25 MG/0.5ML SOAJ, DISCONTINUED: Semaglutide-Weight Management (WEGOVY) 0.5 MG/0.5ML SOAJ, DISCONTINUED: Semaglutide-Weight Management (WEGOVY) 1 MG/0.5ML SOAJ, DISCONTINUED: Semaglutide-Weight Management (WEGOVY) 1.7 MG/0.75ML SOAJ, DISCONTINUED: Semaglutide-Weight Management (WEGOVY) 2.4 MG/0.75ML SOAJ, DISCONTINUED: Liraglutide -Weight Management (SAXENDA) 18 MG/3ML SOPN, DISCONTINUED: Insulin Pen Needle (NOVOFINE) 30G X 8 MM MISC  Prediabetes - Plan: Insulin Pen Needle (NOVOFINE) 30G X 8 MM MISC, Liraglutide -Weight Management (SAXENDA) 18 MG/3ML SOPN, Semaglutide-Weight Management (WEGOVY) 0.25 MG/0.5ML SOAJ, Semaglutide-Weight Management (WEGOVY) 0.5 MG/0.5ML SOAJ, Semaglutide-Weight Management (WEGOVY) 1 MG/0.5ML SOAJ, Semaglutide-Weight Management (WEGOVY) 1.7 MG/0.75ML SOAJ, Semaglutide-Weight Management (WEGOVY) 2.4 MG/0.75ML SOAJ, DISCONTINUED: Semaglutide-Weight Management (WEGOVY) 0.25 MG/0.5ML SOAJ, DISCONTINUED: Semaglutide-Weight Management (WEGOVY) 0.5 MG/0.5ML SOAJ, DISCONTINUED: Semaglutide-Weight Management (WEGOVY) 1 MG/0.5ML SOAJ, DISCONTINUED: Semaglutide-Weight Management (WEGOVY) 1.7 MG/0.75ML SOAJ, DISCONTINUED: Semaglutide-Weight Management (WEGOVY) 2.4 MG/0.75ML SOAJ, DISCONTINUED: Liraglutide -Weight Management (SAXENDA) 18 MG/3ML SOPN, DISCONTINUED: Insulin Pen Needle (NOVOFINE) 30G X 8 MM MISC  Hyperlipidemia, unspecified hyperlipidemia type - Plan: Insulin Pen Needle (NOVOFINE) 30G X 8 MM MISC, Liraglutide -Weight Management (SAXENDA) 18 MG/3ML SOPN, Semaglutide-Weight Management (WEGOVY) 0.25 MG/0.5ML SOAJ, Semaglutide-Weight Management (WEGOVY) 0.5 MG/0.5ML SOAJ, Semaglutide-Weight Management  (WEGOVY) 1 MG/0.5ML SOAJ, Semaglutide-Weight Management (WEGOVY) 1.7 MG/0.75ML SOAJ, Semaglutide-Weight Management (WEGOVY) 2.4 MG/0.75ML SOAJ, DISCONTINUED: Semaglutide-Weight Management (WEGOVY) 0.25 MG/0.5ML SOAJ, DISCONTINUED: Semaglutide-Weight Management (WEGOVY) 0.5 MG/0.5ML SOAJ, DISCONTINUED: Semaglutide-Weight Management (WEGOVY) 1 MG/0.5ML SOAJ, DISCONTINUED: Semaglutide-Weight Management (WEGOVY) 1.7 MG/0.75ML SOAJ, DISCONTINUED: Semaglutide-Weight Management (WEGOVY) 2.4 MG/0.75ML SOAJ, DISCONTINUED: Liraglutide -Weight Management (SAXENDA) 18 MG/3ML SOPN, DISCONTINUED: Insulin Pen Needle (NOVOFINE) 30G X 8 MM MISC   HM  See last visit   -we discussed possible serious and likely etiologies, options for evaluation and workup, limitations of telemedicine visit vs in person visit, treatment, treatment risks and precautions. Pt is agreeable to treatment via telemedicine at this moment.   I discussed the assessment and treatment plan with the patient. The patient was provided an opportunity to ask questions and all were answered. The patient agreed with the plan and demonstrated an understanding of the instructions.    Time spent 20 minutes Delorise Jackson, MD

## 2022-04-30 NOTE — Patient Instructions (Addendum)
[facility]  [address] [place] [address]  Phone:  [phone]  Fax:  [phone]   Semaglutide Injection (Weight Management) What is this medication? SEMAGLUTIDE (SEM a GLOO tide) promotes weight loss. It may also be used to maintain weight loss. It works by decreasing appetite. Changes to diet and exercise are often combined with this medication. This medicine may be used for other purposes; ask your health care provider or pharmacist if you have questions. COMMON BRAND NAME(S): [id number] What should I tell my care team before I take this medication? They need to know if you have any of these conditions: Endocrine tumors (MEN 2) or if someone in your family had these tumors Eye disease, vision problems Gallbladder disease History of depression or mental health disease History of pancreatitis Kidney disease Stomach or intestine problems Suicidal thoughts, plans, or attempt; a previous suicide attempt by you or a family member Thyroid cancer or if someone in your family had thyroid cancer An unusual or allergic reaction to semaglutide, other medications, foods, dyes, or preservatives Pregnant or trying to get pregnant Breast-feeding How should I use this medication? This medication is injected under the skin. You will be taught how to prepare and give it. Take it as directed on the prescription label. It is given once every week (every 7 days). Keep taking it unless your care team tells you to stop. It is important that you put your used needles and pens in a special sharps container. Do not put them in a trash can. If you do not have a sharps container, call your pharmacist or care team to get one. A special MedGuide will be given to you by the pharmacist with each prescription and refill. Be sure to read this information carefully each time. This medication comes with INSTRUCTIONS FOR USE. Ask your pharmacist for directions on how to use this  medication. Read the information carefully. Talk to your pharmacist or care team if you have questions. Talk to your care team about the use of this medication in children. While it may be prescribed for children as young as 12 years for selected conditions, precautions do apply. Overdosage: If you think you have taken too much of this medicine contact a poison control center or emergency room at once. NOTE: This medicine is only for you. Do not share this medicine with others. What if I miss a dose? If you miss a dose and the next scheduled dose is more than 2 days away, take the missed dose as soon as possible. If you miss a dose and the next scheduled dose is less than 2 days away, do not take the missed dose. Take the next dose at your regular time. Do not take double or extra doses. If you miss your dose for 2 weeks or more, take the next dose at your regular time or call your care team to talk about how to restart this medication. What may interact with this medication? Insulin and other medications for diabetes This list may not describe all possible interactions. Give your health care provider a list of all the medicines, herbs, non-prescription drugs, or dietary supplements you use. Also tell them if you smoke, drink alcohol, or use illegal drugs. Some items may interact with your medicine. What should I watch for while using this medication? Visit your care team for regular checks on your progress. It may be some time before you see the benefit from this medication. Drink plenty of  fluids while taking this medication. Check with your care team if you have severe diarrhea, nausea, and vomiting, or if you sweat a lot. The loss of too much body fluid may make it dangerous for you to take this medication. This medication may affect blood sugar levels. Ask your care team if changes in diet or medications are needed if you have diabetes. If you or your family notice any changes in your behavior,  such as new or worsening depression, thoughts of harming yourself, anxiety, other unusual or disturbing thoughts, or memory loss, call your care team right away. Women should inform their care team if they wish to become pregnant or think they might be pregnant. Losing weight while pregnant is not advised and may cause harm to the unborn child. Talk to your care team for more information. What side effects may I notice from receiving this medication? Side effects that you should report to your care team as soon as possible: Allergic reactions--skin rash, itching, hives, swelling of the face, lips, tongue, or throat Change in vision Dehydration--increased thirst, dry mouth, feeling faint or lightheaded, headache, dark yellow or brown urine Gallbladder problems--severe stomach pain, nausea, vomiting, fever Heart palpitations--rapid, pounding, or irregular heartbeat Kidney injury--decrease in the amount of urine, swelling of the ankles, hands, or feet Pancreatitis--severe stomach pain that spreads to your back or gets worse after eating or when touched, fever, nausea, vomiting Thoughts of suicide or self-harm, worsening mood, feelings of depression Thyroid cancer--new mass or lump in the neck, pain or trouble swallowing, trouble breathing, hoarseness Side effects that usually do not require medical attention (report to your care team if they continue or are bothersome): Diarrhea Loss of appetite Nausea Stomach pain Vomiting This list may not describe all possible side effects. Call your doctor for medical advice about side effects. You may report side effects to FDA at 1-800-FDA-1088. Where should I keep my medication? Keep out of the reach of children and pets. Refrigeration (preferred): Store in the refrigerator. Do not freeze. Keep this medication in the original container until you are ready to take it. Get rid of any unused medication after the expiration date. Room temperature: If needed,  prior to cap removal, the pen can be stored at room temperature for up to 28 days. Protect from light. If it is stored at room temperature, get rid of any unused medication after 28 days or after it expires, whichever is first. It is important to get rid of the medication as soon as you no longer need it or it is expired. You can do this in two ways: Take the medication to a medication take-back program. Check with your pharmacy or law enforcement to find a location. If you cannot return the medication, follow the directions in the [facility]. NOTE: This sheet is a summary. It may not cover all possible information. If you have questions about this medicine, talk to your doctor, pharmacist, or health care provider.  2023 Elsevier/Gold Standard (2020-11-01 00:00:00)  Liraglutide Injection (Weight Management) What is this medication? LIRAGLUTIDE (LIR a GLOO tide) promotes weight loss. It may also be used to maintain weight loss. It works by decreasing appetite. Changes to diet and exercise are often combined with this medication. This medicine may be used for other purposes; ask your health care provider or pharmacist if you have questions. COMMON BRAND NAME(S): Saxenda What should I tell my care team before I take this medication? They need to know if you have any of these conditions:  Endocrine tumors (MEN 2) or if someone in your family had these tumors Gallbladder disease High cholesterol History of alcohol abuse problem History of pancreatitis Kidney disease or if you are on dialysis Liver disease Previous swelling of the tongue, face, or lips with difficulty breathing, difficulty swallowing, hoarseness, or tightening of the throat Stomach problems Suicidal thoughts, plans, or attempt; a previous suicide attempt by you or a family member Thyroid cancer or if someone in your family had thyroid cancer An unusual or allergic reaction to liraglutide, other medications, foods, dyes, or  preservatives Pregnant or trying to get pregnant Breast-feeding How should I use this medication? This medication is for injection under the skin of your upper leg, stomach area, or upper arm. You will be taught how to prepare and give this medication. Use exactly as directed. Take your medication at regular intervals. Do not take it more often than directed. This medication comes with INSTRUCTIONS FOR USE. Ask your pharmacist for directions on how to use this medication. Read the information carefully. Talk to your pharmacist or care team if you have questions. It is important that you put your used needles and syringes in a special sharps container. Do not put them in a trash can. If you do not have a sharps container, call your pharmacist or care team to get one. A special MedGuide will be given to you by the pharmacist with each prescription and refill. Be sure to read this information carefully each time. Talk to your care team about the use of this medication in children. While it may be prescribed for children as young as 43 years of age for selected conditions, precautions do apply. Overdosage: If you think you have taken too much of this medicine contact a poison control center or emergency room at once. NOTE: This medicine is only for you. Do not share this medicine with others. What if I miss a dose? If you miss a dose, take it as soon as you can. If it is almost time for your next dose, take only that dose. Do not take double or extra doses. If you miss your dose for 3 days or more, call your care team to talk about how to restart this medicine. What may interact with this medication? Insulin and other medications for diabetes This list may not describe all possible interactions. Give your health care provider a list of all the medicines, herbs, non-prescription drugs, or dietary supplements you use. Also tell them if you smoke, drink alcohol, or use illegal drugs. Some items may interact  with your medicine. What should I watch for while using this medication? Visit your care team for regular checks on your progress. Drink plenty of fluids while taking this medication. Check with your care team if you get an attack of severe diarrhea, nausea, and vomiting. The loss of too much body fluid can make it dangerous for you to take this medication. This medication may affect blood sugar levels. Ask your care team if changes in diet or medications are needed if you have diabetes. Patients and their families should watch out for worsening depression or thoughts of suicide. Also watch out for sudden changes in feelings such as feeling anxious, agitated, panicky, irritable, hostile, aggressive, impulsive, severely restless, overly excited and hyperactive, or not being able to sleep. If this happens, especially at the beginning of treatment or after a change in dose, call your care team. Women should inform their care team if they wish to become pregnant  or think they might be pregnant. Losing weight while pregnant is not advised and may cause harm to the unborn child. Talk to your care team for more information. What side effects may I notice from receiving this medication? Side effects that you should report to your care team as soon as possible: Allergic reactions or angioedema--skin rash, itching, hives, swelling of the face, eyes, lips, tongue, arms, or legs, trouble swallowing or breathing Fast or irregular heartbeat Gallbladder problems--severe stomach pain, nausea, vomiting, fever Kidney injury--decrease in the amount of urine, swelling of the ankles, hands, or feet Pancreatitis--severe stomach pain that spreads to your back or gets worse after eating or when touched, fever, nausea, vomiting Thoughts of suicide or self-harm, worsening mood, feelings of depression Thyroid cancer--new mass or lump in the neck, pain or trouble swallowing, trouble breathing, hoarseness Side effects that  usually do not require medical attention (report to your care team if they continue or are bothersome): Constipation Dizziness Fatigue Headache Loss of Appetite Nausea Upset stomach This list may not describe all possible side effects. Call your doctor for medical advice about side effects. You may report side effects to FDA at 1-800-FDA-1088. Where should I keep my medication? Keep out of the reach of children and pets. Store unopened pen in a refrigerator between 2 and 8 degrees C (36 and 46 degrees F). Do not freeze or use if the medication has been frozen. Protect from light and excessive heat. After you first use the pen, it can be stored at room temperature between 15 and 30 degrees C (59 and 86 degrees F) or in a refrigerator. Throw away your used pen after 30 days or after the expiration date, whichever comes first. Do not store your pen with the needle attached. If the needle is left on, medication may leak from the pen. NOTE: This sheet is a summary. It may not cover all possible information. If you have questions about this medicine, talk to your doctor, pharmacist, or health care provider.  2023 Elsevier/Gold Standard (2020-09-21 00:00:00)

## 2022-04-30 NOTE — Progress Notes (Signed)
Patient: Anita Gibbs  Service Category: E/M  Provider: Gillis Santa, MD  DOB: May 06, 1971  DOS: 04/30/2022  Location: Office  MRN: [id number]  Setting: Ambulatory outpatient  Referring Provider: McLean-Scocuzza, Olivia Mackie *  Type: Established Patient  Specialty: Interventional Pain Management  PCP: McLean-Scocuzza, Nino Glow, MD  Location: Remote location  Delivery: TeleHealth     Virtual Encounter - Pain Management PROVIDER NOTE: Information contained herein reflects review and annotations entered in association with encounter. Interpretation of such information and data should be left to medically-trained personnel. Information provided to patient can be located elsewhere in the medical record under "Patient Instructions". Document created using STT-dictation technology, any transcriptional errors that may result from process are unintentional.    Contact & Pharmacy Preferred: [phone] Home: [phone] (home) Mobile: [phone] (mobile) E-mail: [id number] [address], [address] [place] [address] Phone: [phone] Fax: [place], [place] [address] [place] [address] Phone: [phone] Fax: [phone]   Pre-screening  Anita Gibbs offered "in-person" vs "virtual" encounter. She indicated preferring virtual for this encounter.   Reason COVID-19*  Social distancing based on CDC and AMA recommendations.   I contacted Anita Gibbs on 04/30/2022 via telephone.      I clearly identified myself as Gillis Santa, MD. I verified that I was speaking with the correct person using two identifiers (Name: Anita Gibbs, and date of birth: 11/15/1970).  Consent I sought verbal advanced consent from Anita Gibbs for virtual visit interactions. I informed Anita Gibbs of possible security and privacy concerns, risks,  and limitations associated with providing "not-in-person" medical evaluation and management services. I also informed Anita Gibbs of the availability of "in-person" appointments. Finally, I informed her that there would be a charge for the virtual visit and that she could be  personally, fully or partially, financially responsible for it. Anita Gibbs expressed understanding and agreed to proceed.   Historic Elements   Anita Gibbs is a 51 y.o. year old, female patient evaluated today after our last contact on 03/31/2022. Anita Gibbs  has a past medical history of Arthritis, Carpal boss, right, Carpal tunnel syndrome, bilateral, Headache, Hives (09/05/2020), Hypertension, and Seasonal asthma (11/29/2018). She also  has a past surgical history that includes Knee arthroscopy (Right); Anterior cervical decomp/discectomy fusion (N/A, 07/01/2017); Abdominal hysterectomy (10/17/2020); and Carpal tunnel release (Bilateral). Anita Gibbs has a current medication list which includes the following prescription(s): albuterol, amlodipine, atorvastatin, buprenorphine, cetirizine, vitamin d-3, cyanocobalamin, dicyclomine, diphenhydramine, duloxetine, lumbar back brace/support pad, epinephrine, fluticasone, furosemide, hydrochlorothiazide, hydrocortisone, losartan, meloxicam, methocarbamol, metronidazole, montelukast, olopatadine hcl, pantoprazole, potassium chloride sa, pregabalin, insulin pen needle, saxenda, wegovy, wegovy, wegovy, wegovy, and wegovy. She  reports that she has never smoked. She has never used smokeless tobacco. She reports current alcohol use of about 3.0 standard drinks of alcohol per week. She reports that she does not use drugs. Anita Gibbs is allergic to other, pineapple, shellfish allergy, strawberry (diagnostic), and tomato.   HPI  Today, she is being contacted for a post-procedure assessment.   Post-procedure evaluation   Type: Therapeutic Inter-Laminar Epidural Steroid Injection   Region:  Lumbar Level: L5-S1 Level. Laterality: Right-Sided       Effectiveness:  Initial hour after procedure: 80 %  Subsequent 4-6 hours post-procedure: 80 %  Analgesia past initial 6 hours: 0 % (  CAME BACK THE DAY AFTER PROCEDURE.)  Ongoing improvement:  Analgesic:  0% Function: Anita Gibbs reports improvement in function ROM: Anita Gibbs reports improvement in ROM   Laboratory Chemistry Profile   Renal Lab Results  Component Value Date   BUN 18 02/19/2022   CREATININE 0.94 40/37/5436   BCR NOT APPLICABLE 06/77/0340   GFR 71.70 02/19/2022   GFRAA 76 09/07/2018   GFRNONAA 66 09/07/2018    Hepatic Lab Results  Component Value Date   AST 14 08/29/2021   ALT 14 08/29/2021   ALBUMIN 3.9 08/29/2021   ALKPHOS 66 08/29/2021    Electrolytes Lab Results  Component Value Date   NA 139 02/19/2022   K 3.8 02/19/2022   CL 102 02/19/2022   CALCIUM 9.4 02/19/2022   MG 2.2 09/07/2018    Bone Lab Results  Component Value Date   VD25OH 54.97 01/24/2021   25OHVITD1 8.1 (L) 09/07/2018   25OHVITD2 <1.0 09/07/2018   25OHVITD3 8.1 09/07/2018    Inflammation (CRP: Acute Phase) (ESR: Chronic Phase) Lab Results  Component Value Date   CRP <1.0 01/24/2021   ESRSEDRATE 37 (H) 01/24/2021         Note: Above Lab results reviewed.  Imaging  CLINICAL DATA:  Low back pain, prior surgery.  Neuropathy.   EXAM: MRI LUMBAR SPINE WITHOUT CONTRAST   TECHNIQUE: Multiplanar, multisequence MR imaging of the lumbar spine was performed. No intravenous contrast was administered.   COMPARISON:  Radiographs Jan 02, 2021   FINDINGS: Segmentation:  Standard.   Alignment:  Physiologic.   Vertebrae:  No fracture, evidence of discitis, or bone lesion.   Conus medullaris and cauda equina: Conus extends to the L1 level. Conus and cauda equina appear normal.   Paraspinal and other soft tissues: Right adnexal cyst measuring at least 3.5 cm, only partially imaged.   Disc levels:   T12-L1:No  spinal canal or neural foraminal stenosis.   L1-2:No spinal canal or neural foraminal stenosis.   L2-3:No spinal canal or neural foraminal stenosis.   L3-4:Mild facet degenerative changes.No spinal canal or neural foraminal stenosis.   L4-5:Mild facet degenerative changes.No spinal canal or neural foraminal stenosis.   L5-S1:Mild-to-moderate facet degenerative changes.No spinal canal or neural foraminal stenosis.   IMPRESSION: Mild degenerative changes of the lumbar spine at the level of the facet joints at L3-4, L4-5 and L5-S1. No high-grade spinal canal or neural foraminal stenosis at any level.     Electronically Signed   By: Pedro Earls M.D.   On: 02/12/2021 08:47       Assessment  The primary encounter diagnosis was Lumbar facet arthropathy. Diagnoses of Spondylosis without myelopathy or radiculopathy, cervical region and Lumbar facet syndrome (Bilateral) (R>L) were also pertinent to this visit.  Plan of Care   Unfortunately no response from repeat lumbar epidural steroid injection.  Did patient's pain is predominantly lower back and is worse with axial loading.  I reviewed her lumbar MRI from last June and it does show facet arthropathy at L3-L4, L4-L5 and L5-S1.  She has done physical therapy in the past and has tried to do home stretching and strengthening physical therapy exercises that I provided her about 6 months ago.  Anita Gibbs has a history of greater than 3 months of moderate to severe pain which is resulted in functional impairment.  The patient has tried various conservative therapeutic options such as NSAIDs, Tylenol, muscle relaxants, physical therapy which was inadequately effective.  Patient's pain is predominantly  axial with physical exam and L-MRI findings suggestive of facet arthropathy.  Lumbar facet medial branch nerve blocks were discussed with the patient.  Risks and benefits were reviewed.  Patient would like to proceed with  bilateral L3, L4, L5 medial branch nerve block.    Follow-up plan:   Return in about 19 days (around 05/19/2022) for B/L L3, 4, 5 MBNB , in clinic IV Versed.    Recent Visits Date Type Provider Dept  03/31/22 Procedure visit Gillis Santa, MD [facility]  03/18/22 Office Visit Gillis Santa, MD [facility]  Showing recent visits within past 90 days and meeting all other requirements Today's Visits Date Type Provider Dept  04/30/22 Office Visit Gillis Santa, MD [facility]  Showing today's visits and meeting all other requirements Future Appointments Date Type Provider Dept  07/08/22 Appointment Gillis Santa, MD [facility]  Showing future appointments within next 90 days and meeting all other requirements  I discussed the assessment and treatment plan with the patient. The patient was provided an opportunity to ask questions and all were answered. The patient agreed with the plan and demonstrated an understanding of the instructions.  Patient advised to call back or seek an in-person evaluation if the symptoms or condition worsens.  Duration of encounter: 68mnutes.  Note by: BGillis Santa MD Date: 04/30/2022; Time: 2:58 PM

## 2022-05-01 ENCOUNTER — Other Ambulatory Visit: Payer: Self-pay

## 2022-05-02 ENCOUNTER — Other Ambulatory Visit: Payer: Self-pay

## 2022-05-12 ENCOUNTER — Other Ambulatory Visit: Payer: Self-pay | Admitting: Internal Medicine

## 2022-05-12 ENCOUNTER — Telehealth: Payer: Self-pay

## 2022-05-12 NOTE — Telephone Encounter (Signed)
Today 05/12/22 I faxed over completed  Prior authorization forms for [facility] to fax # : [phone] with a confirmed transmission log.

## 2022-05-12 NOTE — Telephone Encounter (Signed)
LMOM for pt to CB in regards to her Saxenda not being covered.   Dr. Olivia Mackie wanted me to inform the pt that she needs to call her insurance to see if wt loss meds are covered.

## 2022-05-13 ENCOUNTER — Telehealth: Payer: Self-pay

## 2022-05-13 NOTE — Telephone Encounter (Signed)
Spoke with pt in regards to a Prior Authorization that we did for [facility] via fax. We faxed over the completed neccessary form but they faxed back stating.    "In researching your fax request, we are NOT able to locate active coverage or an active account with the pts ID number, name, and DOB provided."     Dr. Olivia Mackie wanted me to reach out to pt to see if she can call her insurance and see if she is covered.   Pt stated she will reach out to her insurance and get back to [place].

## 2022-05-14 ENCOUNTER — Other Ambulatory Visit: Payer: Self-pay

## 2022-05-19 ENCOUNTER — Ambulatory Visit
Admission: RE | Admit: 2022-05-19 | Discharge: 2022-05-19 | Disposition: A | Discharge status: Home / Self Care | Payer: Self-pay | Source: Ambulatory Visit | Attending: Student in an Organized Health Care Education/Training Program | Admitting: Student in an Organized Health Care Education/Training Program

## 2022-05-19 ENCOUNTER — Ambulatory Visit
Payer: Self-pay | Attending: Student in an Organized Health Care Education/Training Program | Admitting: Student in an Organized Health Care Education/Training Program

## 2022-05-19 ENCOUNTER — Encounter: Payer: Self-pay | Admitting: Student in an Organized Health Care Education/Training Program

## 2022-05-19 VITALS — BP 122/85 | HR 66 | Temp 98.1°F | Resp 16 | Ht 63.0 in | Wt 240.0 lb

## 2022-05-19 DIAGNOSIS — M47812 Spondylosis without myelopathy or radiculopathy, cervical region: Secondary | ICD-10-CM | POA: Insufficient documentation

## 2022-05-19 DIAGNOSIS — M47816 Spondylosis without myelopathy or radiculopathy, lumbar region: Secondary | ICD-10-CM | POA: Insufficient documentation

## 2022-05-19 MED ORDER — DEXAMETHASONE SODIUM PHOSPHATE 10 MG/ML IJ SOLN
10.0000 mg | Freq: Once | INTRAMUSCULAR | Status: AC
  Administered 2022-05-19: 10 mg

## 2022-05-19 MED ORDER — LIDOCAINE HCL 2 % IJ SOLN
20.0000 mL | Freq: Once | INTRAMUSCULAR | Status: AC
  Administered 2022-05-19: 400 mg

## 2022-05-19 MED ORDER — ROPIVACAINE HCL 2 MG/ML IJ SOLN
9.0000 mL | Freq: Once | INTRAMUSCULAR | Status: AC
  Administered 2022-05-19: 9 mL via PERINEURAL

## 2022-05-19 MED ORDER — DEXAMETHASONE SODIUM PHOSPHATE 10 MG/ML IJ SOLN
INTRAMUSCULAR | Status: AC
  Filled 2022-05-19: qty 2

## 2022-05-19 MED ORDER — LIDOCAINE HCL 2 % IJ SOLN
INTRAMUSCULAR | Status: AC
  Filled 2022-05-19: qty 20

## 2022-05-19 MED ORDER — MIDAZOLAM HCL 5 MG/5ML IJ SOLN
0.5000 mg | Freq: Once | INTRAMUSCULAR | Status: AC
  Administered 2022-05-19: 2 mg via INTRAVENOUS

## 2022-05-19 MED ORDER — LACTATED RINGERS IV SOLN: Freq: Once | INTRAVENOUS | Status: AC

## 2022-05-19 MED ORDER — ROPIVACAINE HCL 2 MG/ML IJ SOLN
INTRAMUSCULAR | Status: AC
  Filled 2022-05-19: qty 20

## 2022-05-19 MED ORDER — MIDAZOLAM HCL 5 MG/5ML IJ SOLN
INTRAMUSCULAR | Status: AC
  Filled 2022-05-19: qty 5

## 2022-05-19 NOTE — Progress Notes (Signed)
Safety precautions to be maintained throughout the outpatient stay will include: orient to surroundings, keep bed in low position, maintain call bell within reach at all times, provide assistance with transfer out of bed and ambulation.  

## 2022-05-19 NOTE — Patient Instructions (Signed)

## 2022-05-19 NOTE — Progress Notes (Signed)
PROVIDER NOTE: Interpretation of information contained herein should be left to medically-trained personnel. Specific patient instructions are provided elsewhere under "Patient Instructions" section of medical record. This document was created in part using STT-dictation technology, any transcriptional errors that may result from this process are unintentional.  Patient: Anita Gibbs Type: Established DOB: December 25, 1970 MRN: [id number] PCP: McLean-Scocuzza, Nino Glow, MD  Service: Procedure DOS: 05/19/2022 Setting: Ambulatory Location: Ambulatory outpatient facility Delivery: Face-to-face Provider: Gillis Santa, MD Specialty: Interventional Pain Management Specialty designation: 09 Location: Outpatient facility Ref. Prov.: McLean-Scocuzza, Olivia Mackie *    Procedure:           Type: Lumbar Facet, Medial Branch Block(s) #1  Laterality: Bilateral  Level: L3, L4, L5,  Medial Branch Level(s). Injecting these levels blocks the L3-4 and L4-5 lumbar facet joints. Imaging: Fluoroscopic guidance         Anesthesia: Local anesthesia (1-2% Lidocaine) Anxiolysis: IV Versed         Sedation: Minimal Sedation  minimal          DOS: 05/19/2022 Performed by: Gillis Santa, MD  Primary Purpose: Diagnostic/Therapeutic Indications: Low back pain severe enough to impact quality of life or function. 1. Lumbar facet arthropathy   2. Spondylosis without myelopathy or radiculopathy, cervical region   3. Lumbar facet syndrome (Bilateral) (R>L)    NAS-11 Pain score:   Pre-procedure: 9 /10   Post-procedure: 1 /10     Position / Prep / Materials:  Position: Prone  Prep solution: DuraPrep (Iodine Povacrylex [0.7% available iodine] and Isopropyl Alcohol, 74% w/w) Area Prepped: Posterolateral Lumbosacral Spine (Wide prep: From the lower border of the scapula down to the end of the tailbone and from flank to flank.)  Materials:  Tray: Block Needle(s):  Type: Spinal  Gauge (G): 22  Length: 3.5-in Qty: 2      Pre-op H&P Assessment:  Anita Gibbs is a 51 y.o. (year old), female patient, seen today for interventional treatment. She  has a past surgical history that includes Knee arthroscopy (Right); Anterior cervical decomp/discectomy fusion (N/A, 07/01/2017); Abdominal hysterectomy (10/17/2020); and Carpal tunnel release (Bilateral). Anita Gibbs has a current medication list which includes the following prescription(s): albuterol, amlodipine, atorvastatin, buprenorphine, cetirizine, vitamin d-3, cyanocobalamin, dicyclomine, diphenhydramine, duloxetine, lumbar back brace/support pad, epinephrine, fluticasone, furosemide, hydrochlorothiazide, hydrocortisone, losartan, meloxicam, methocarbamol, metronidazole, montelukast, olopatadine hcl, pantoprazole, potassium chloride sa, pregabalin, insulin pen needle, wegovy, wegovy, wegovy, wegovy, and wegovy. Her primarily concern today is the Back Pain (lower)  Initial Vital Signs:  Pulse/HCG Rate: 66  Temp:  (!) 97.5 F (36.4 C) Resp: 17 BP: 139/82 SpO2: 99 %  BMI: Estimated body mass index is 42.51 kg/m as calculated from the following:   Height as of this encounter: '5\' 3"'$  (1.6 m).   Weight as of this encounter: 240 lb (108.9 kg).  Risk Assessment: Allergies: Reviewed. She is allergic to other, pineapple, shellfish allergy, strawberry (diagnostic), and tomato.  Allergy Precautions: None required Coagulopathies: Reviewed. None identified.  Blood-thinner therapy: None at this time Active Infection(s): Reviewed. None identified. Anita Gibbs is afebrile  Site Confirmation: Anita Gibbs was asked to confirm the procedure and laterality before marking the site Procedure checklist: Completed Consent: Before the procedure and under the influence of no sedative(s), amnesic(s), or anxiolytics, the patient was informed of the treatment options, risks and possible complications. To fulfill our ethical and legal obligations, as recommended by the American Medical  Association's Code of Ethics, I have informed the patient of my clinical impression; the nature and  purpose of the treatment or procedure; the risks, benefits, and possible complications of the intervention; the alternatives, including doing nothing; the risk(s) and benefit(s) of the alternative treatment(s) or procedure(s); and the risk(s) and benefit(s) of doing nothing. The patient was provided information about the general risks and possible complications associated with the procedure. These may include, but are not limited to: failure to achieve desired goals, infection, bleeding, organ or nerve damage, allergic reactions, paralysis, and death. In addition, the patient was informed of those risks and complications associated to Spine-related procedures, such as failure to decrease pain; infection (i.e.: Meningitis, epidural or intraspinal abscess); bleeding (i.e.: epidural hematoma, subarachnoid hemorrhage, or any other type of intraspinal or peri-dural bleeding); organ or nerve damage (i.e.: Any type of peripheral nerve, nerve root, or spinal cord injury) with subsequent damage to sensory, motor, and/or autonomic systems, resulting in permanent pain, numbness, and/or weakness of one or several areas of the body; allergic reactions; (i.e.: anaphylactic reaction); and/or death. Furthermore, the patient was informed of those risks and complications associated with the medications. These include, but are not limited to: allergic reactions (i.e.: anaphylactic or anaphylactoid reaction(s)); adrenal axis suppression; blood sugar elevation that in diabetics may result in ketoacidosis or comma; water retention that in patients with history of congestive heart failure may result in shortness of breath, pulmonary edema, and decompensation with resultant heart failure; weight gain; swelling or edema; medication-induced neural toxicity; particulate matter embolism and blood vessel occlusion with resultant organ, and/or  nervous system infarction; and/or aseptic necrosis of one or more joints. Finally, the patient was informed that Medicine is not an exact science; therefore, there is also the possibility of unforeseen or unpredictable risks and/or possible complications that may result in a catastrophic outcome. The patient indicated having understood very clearly. We have given the patient no guarantees and we have made no promises. Enough time was given to the patient to ask questions, all of which were answered to the patient's satisfaction. Anita Gibbs has indicated that she wanted to continue with the procedure. Attestation: I, the ordering provider, attest that I have discussed with the patient the benefits, risks, side-effects, alternatives, likelihood of achieving goals, and potential problems during recovery for the procedure that I have provided informed consent. Date  Time: 05/19/2022 10:23 AM  Pre-Procedure Preparation:  Monitoring: As per clinic protocol. Respiration, ETCO2, SpO2, BP, heart rate and rhythm monitor placed and checked for adequate function Safety Precautions: Patient was assessed for positional comfort and pressure points before starting the procedure. Time-out: I initiated and conducted the "Time-out" before starting the procedure, as per protocol. The patient was asked to participate by confirming the accuracy of the "Time Out" information. Verification of the correct person, site, and procedure were performed and confirmed by me, the nursing staff, and the patient. "Time-out" conducted as per Joint Commission's Universal Protocol (UP.01.01.01). Time: 1057  Description of Procedure:          Laterality: Bilateral. The procedure was performed in identical fashion on both sides. Targeted Levels: L3, L4, L5,Medial Branch Level(s)  Safety Precautions: Aspiration looking for blood return was conducted prior to all injections. At no point did we inject any substances, as a needle was being  advanced. Before injecting, the patient was told to immediately notify me if she was experiencing any new onset of "ringing in the ears, or metallic taste in the mouth". No attempts were made at seeking any paresthesias. Safe injection practices and needle disposal techniques used. Medications properly checked  for expiration dates. SDV (single dose vial) medications used. After the completion of the procedure, all disposable equipment used was discarded in the proper designated medical waste containers. Local Anesthesia: Protocol guidelines were followed. The patient was positioned over the fluoroscopy table. The area was prepped in the usual manner. The time-out was completed. The target area was identified using fluoroscopy. A 12-in long, straight, sterile hemostat was used with fluoroscopic guidance to locate the targets for each level blocked. Once located, the skin was marked with an approved surgical skin marker. Once all sites were marked, the skin (epidermis, dermis, and hypodermis), as well as deeper tissues (fat, connective tissue and muscle) were infiltrated with a small amount of a short-acting local anesthetic, loaded on a 10cc syringe with a 25G, 1.5-in  Needle. An appropriate amount of time was allowed for local anesthetics to take effect before proceeding to the next step. Local Anesthetic: Lidocaine 2.0% The unused portion of the local anesthetic was discarded in the proper designated containers. Technical description of process:  L3 Medial Branch Nerve Block (MBB): The target area for the L3 medial branch is at the junction of the postero-lateral aspect of the superior articular process and the superior, posterior, and medial edge of the transverse process of L4. Under fluoroscopic guidance, a Quincke needle was inserted until contact was made with os over the superior postero-lateral aspect of the pedicular shadow (target area). After negative aspiration for blood,80m of the nerve block  solution was injected without difficulty or complication. The needle was removed intact. L4 Medial Branch Nerve Block (MBB): The target area for the L4 medial branch is at the junction of the postero-lateral aspect of the superior articular process and the superior, posterior, and medial edge of the transverse process of L5. Under fluoroscopic guidance, a Quincke needle was inserted until contact was made with os over the superior postero-lateral aspect of the pedicular shadow (target area). After negative aspiration for blood, 2629mof the nerve block solution was injected without difficulty or complication. The needle was removed intact. L5 Medial Branch Nerve Block (MBB): The target area for the L5 medial branch is at the junction of the postero-lateral aspect of the superior articular process and the superior, posterior, and medial edge of the sacral ala. Under fluoroscopic guidance, a Quincke needle was inserted until contact was made with os over the superior postero-lateral aspect of the pedicular shadow (target area). After negative aspiration for blood, 29m70mf the nerve block solution was injected without difficulty or complication. The needle was removed intact.   12 cc solution made of 10 cc of 0.2% ropivacaine, 2 cc of Decadron 10 mg/cc.  2 cc injected at each level above bilaterally.    Once the entire procedure was completed, the treated area was cleaned, making sure to leave some of the prepping solution back to take advantage of its long term bactericidal properties.         Illustration of the posterior view of the lumbar spine and the posterior neural structures. Laminae of L2 through S1 are labeled. DPRL5, dorsal primary ramus of L5; DPRS1, dorsal primary ramus of S1; DPR3, dorsal primary ramus of L3; FJ, facet (zygapophyseal) joint L3-L4; I, inferior articular process of L4; LB1, lateral branch of dorsal primary ramus of L1; IAB, inferior articular branches from L3 medial branch  (supplies L4-L5 facet joint); IBP, intermediate branch plexus; MB3, medial branch of dorsal primary ramus of L3; NR3, third lumbar nerve root; S, superior articular process of L5;  SAB, superior articular branches from L4 (supplies L4-5 facet joint also); TP3, transverse process of L3.  Vitals:   05/19/22 1057 05/19/22 1102 05/19/22 1107 05/19/22 1112  BP: 134/84 124/85 131/80 122/85  Pulse: 63 61 60 66  Resp: '15 17 16 16  '$ Temp:    98.1 F (36.7 C)  TempSrc:    Temporal  SpO2: 98% 98% 97% 100%  Weight:      Height:         Start Time: 1057 hrs. End Time: 1107 hrs.  Imaging Guidance (Spinal):          Type of Imaging Technique: Fluoroscopy Guidance (Spinal) Indication(s): Assistance in needle guidance and placement for procedures requiring needle placement in or near specific anatomical locations not easily accessible without such assistance. Exposure Time: Please see nurses notes. Contrast: None used. Fluoroscopic Guidance: I was personally present during the use of fluoroscopy. "Tunnel Vision Technique" used to obtain the best possible view of the target area. Parallax error corrected before commencing the procedure. "Direction-depth-direction" technique used to introduce the needle under continuous pulsed fluoroscopy. Once target was reached, antero-posterior, oblique, and lateral fluoroscopic projection used confirm needle placement in all planes. Images permanently stored in EMR. Interpretation: No contrast injected. I personally interpreted the imaging intraoperatively. Adequate needle placement confirmed in multiple planes. Permanent images saved into the patient's record.  Post-operative Assessment:  Post-procedure Vital Signs:  Pulse/HCG Rate: 66  Temp: 98.1 F (36.7 C) Resp: 16 BP: 122/85 SpO2: 100 %  EBL: None  Complications: No immediate post-treatment complications observed by team, or reported by patient.  Note: The patient tolerated the entire procedure well. A  repeat set of vitals were taken after the procedure and the patient was kept under observation following institutional policy, for this type of procedure. Post-procedural neurological assessment was performed, showing return to baseline, prior to discharge. The patient was provided with post-procedure discharge instructions, including a section on how to identify potential problems. Should any problems arise concerning this procedure, the patient was given instructions to immediately contact us, at any time, without hesitation. In any case, we plan to contact the patient by telephone for a follow-up status report regarding this interventional procedure.  Comments:  No additional relevant information.  Plan of Care  Orders:  Orders Placed This Encounter  Procedures   DG [facility] C-ARM 1-60 MIN NO REPORT    Intraoperative interpretation by procedural physician at [place].    Standing Status:   Standing    Number of Occurrences:   1    Order Specific Question:   Reason for exam:    Answer:   Assistance in needle guidance and placement for procedures requiring needle placement in or near specific anatomical locations not easily accessible without such assistance.    Medications ordered for procedure: Meds ordered this encounter  Medications   lidocaine (XYLOCAINE) 2 % (with pres) injection 400 mg   lactated ringers infusion   midazolam (VERSED) 5 MG/5ML injection 0.5-2 mg    Make sure Flumazenil is available in the pyxis when using this medication. If oversedation occurs, administer 0.2 mg IV over 15 sec. If after 45 sec no response, administer 0.2 mg again over 1 min; may repeat at 1 min intervals; not to exceed 4 doses (1 mg)   dexamethasone (DECADRON) injection 10 mg   dexamethasone (DECADRON) injection 10 mg   ropivacaine (PF) 2 mg/mL (0.2%) (NAROPIN) injection 9 mL   ropivacaine (PF) 2 mg/mL (0.2%) (NAROPIN) injection  9 mL   Medications administered: We administered  lidocaine, lactated ringers, midazolam, dexamethasone, dexamethasone, ropivacaine (PF) 2 mg/mL (0.2%), and ropivacaine (PF) 2 mg/mL (0.2%).  See the medical record for exact dosing, route, and time of administration.  Follow-up plan:   Return in about 4 weeks (around 06/16/2022) for VV ppe.     Recent Visits Date Type Provider Dept  04/30/22 Office Visit Gillis Santa, MD [facility]  03/31/22 Procedure visit Gillis Santa, MD [facility]  03/18/22 Office Visit Gillis Santa, MD [facility]  Showing recent visits within past 90 days and meeting all other requirements Today's Visits Date Type Provider Dept  05/19/22 Procedure visit Gillis Santa, MD [facility]  Showing today's visits and meeting all other requirements Future Appointments Date Type Provider Dept  06/18/22 Appointment Gillis Santa, MD [facility]  07/08/22 Appointment Gillis Santa, MD [facility]  Showing future appointments within next 90 days and meeting all other requirements  Disposition: Discharge home  Discharge (Date  Time): 05/19/2022; 1114 hrs.   Primary Care Physician: McLean-Scocuzza, Nino Glow, MD Location: [facility] Note by: Gillis Santa, MD Date: 05/19/2022; Time: 11:56 AM  Disclaimer:  Medicine is not an exact science. The only guarantee in medicine is that nothing is guaranteed. It is important to note that the decision to proceed with this intervention was based on the information collected from the patient. The Data and conclusions were drawn from the patient's questionnaire, the interview, and the physical examination. Because the information was provided in large part by the patient, it cannot be guaranteed that it has not been purposely or unconsciously manipulated. Every effort has been made to obtain as much relevant data as possible for this evaluation. It is important to note that the conclusions that lead  to this procedure are derived in large part from the available data. Always take into account that the treatment will also be dependent on availability of resources and existing treatment guidelines, considered by other Pain Management Practitioners as being common knowledge and practice, at the time of the intervention. For Medico-Legal purposes, it is also important to point out that variation in procedural techniques and pharmacological choices are the acceptable norm. The indications, contraindications, technique, and results of the above procedure should only be interpreted and judged by a Board-Certified Interventional Pain Specialist with extensive familiarity and expertise in the same exact procedure and technique.

## 2022-06-16 ENCOUNTER — Telehealth: Payer: Self-pay

## 2022-06-16 NOTE — Telephone Encounter (Signed)
LM for patient to call office for pre virtual appointment questions.  

## 2022-06-18 ENCOUNTER — Ambulatory Visit
Payer: 59 | Attending: Student in an Organized Health Care Education/Training Program | Admitting: Student in an Organized Health Care Education/Training Program

## 2022-06-18 ENCOUNTER — Encounter: Payer: Self-pay | Admitting: Student in an Organized Health Care Education/Training Program

## 2022-06-18 DIAGNOSIS — G894 Chronic pain syndrome: Secondary | ICD-10-CM

## 2022-06-18 DIAGNOSIS — M47812 Spondylosis without myelopathy or radiculopathy, cervical region: Secondary | ICD-10-CM

## 2022-06-18 DIAGNOSIS — M47816 Spondylosis without myelopathy or radiculopathy, lumbar region: Secondary | ICD-10-CM

## 2022-06-18 NOTE — Progress Notes (Signed)
Patient: Anita Gibbs  Service Category: E/M  Provider: Gillis Santa, MD  DOB: 12-22-1970  DOS: 06/18/2022  Location: Office  MRN: [id number]  Setting: Ambulatory outpatient  Referring Provider: McLean-Scocuzza, Olivia Mackie *  Type: Established Patient  Specialty: Interventional Pain Management  PCP: McLean-Scocuzza, Nino Glow, MD  Location: Remote location  Delivery: TeleHealth     Virtual Encounter - Pain Management PROVIDER NOTE: Information contained herein reflects review and annotations entered in association with encounter. Interpretation of such information and data should be left to medically-trained personnel. Information provided to patient can be located elsewhere in the medical record under "Patient Instructions". Document created using STT-dictation technology, any transcriptional errors that may result from process are unintentional.    Contact & Pharmacy Preferred: [phone] Home: [phone] (home) Mobile: [phone] (mobile) E-mail: [id number] [address], [address] [place] [address] Phone: [phone] Fax: [phone]  [place], [place] - [address] [place] [address] Phone: [phone] Fax: [place] [address] Phone: [phone] Fax: [phone]   Pre-screening  Ms. Sturtevant offered "in-person" vs "virtual" encounter. She indicated preferring virtual for this encounter.   Reason COVID-19*  Social distancing based on CDC and AMA recommendations.   I contacted [name] on 06/18/2022 via telephone.      I clearly identified myself as Gillis Santa, MD. I verified that I was speaking with the correct person using two identifiers (Name: Anita Gibbs, and date of birth: [birth year]).  Consent I sought verbal  advanced consent from [name] for virtual visit interactions. I informed Ms. Kellett of possible security and privacy concerns, risks, and limitations associated with providing "not-in-person" medical evaluation and management services. I also informed Ms. Finnigan of the availability of "in-person" appointments. Finally, I informed her that there would be a charge for the virtual visit and that she could be  personally, fully or partially, financially responsible for it. Ms. Kirkland expressed understanding and agreed to proceed.   Historic Elements   [name] is a 51 y.o. year old, female patient evaluated today after our last contact on 05/19/2022. Ms. Wisser  has a past medical history of Arthritis, Carpal boss, right, Carpal tunnel syndrome, bilateral, Headache, Hives (09/05/2020), Hypertension, and Seasonal asthma (11/29/2018). She also  has a past surgical history that includes Knee arthroscopy (Right); Anterior cervical decomp/discectomy fusion (N/A, 07/01/2017); Abdominal hysterectomy (10/17/2020); and Carpal tunnel release (Bilateral). Ms. Aaron has a current medication list which includes the following prescription(s): albuterol, amlodipine, atorvastatin, buprenorphine, cetirizine, vitamin d-3, cyanocobalamin, dicyclomine, diphenhydramine, duloxetine, lumbar back brace/support pad, epinephrine, fluticasone, furosemide, hydrochlorothiazide, hydrocortisone, losartan, meloxicam, methocarbamol, metronidazole, montelukast, olopatadine hcl, pantoprazole, potassium chloride sa, pregabalin, insulin pen needle, wegovy, wegovy, wegovy, wegovy, and wegovy. She  reports that she has never smoked. She has never used smokeless tobacco. She reports current alcohol use of about 3.0 standard drinks of alcohol per week. She reports that she does not use drugs. Ms. Sabey is allergic to other, pineapple, shellfish allergy, strawberry (diagnostic), and tomato.   HPI  Today, she is being contacted for a  post-procedure assessment.   Post-procedure evaluation   Type: Lumbar Facet, Medial Branch Block(s) #1  Laterality: Bilateral  Level: L3, L4, L5,  Medial Branch Level(s). Injecting these levels blocks the L3-4 and  L4-5 lumbar facet joints. Imaging: Fluoroscopic guidance         Anesthesia: Local anesthesia (1-2% Lidocaine) Anxiolysis: IV Versed         Sedation: Minimal Sedation  minimal          DOS: 05/19/2022 Performed by: Bilal Lateef, MD  Primary Purpose: Diagnostic/Therapeutic Indications: Low back pain severe enough to impact quality of life or function. 1. Lumbar facet arthropathy   2. Spondylosis without myelopathy or radiculopathy, cervical region   3. Lumbar facet syndrome (Bilateral) (R>L)    NAS-11 Pain score:   Pre-procedure: 9 /10   Post-procedure: 1 /10      Effectiveness:  Initial hour after procedure: 70 %  Subsequent 4-6 hours post-procedure: 70 %  Analgesia past initial 6 hours: 70 % (patient reports that she received good relief x 4 days.)  Ongoing improvement:  Analgesic:  decreased to 65% for 1-2 weeks now progressing back towards pre-procedure pain levels Function: Somewhat improved ROM: Somewhat improved   Plan: repeat lumbar facet medial branch nerve blocks #2, consider RFA after Laboratory Chemistry Profile   Renal Lab Results  Component Value Date   BUN 18 02/19/2022   CREATININE 0.94 02/19/2022   BCR NOT APPLICABLE 02/13/2021   GFR 71.70 02/19/2022   GFRAA 76 09/07/2018   GFRNONAA 66 09/07/2018    Hepatic Lab Results  Component Value Date   AST 14 08/29/2021   ALT 14 08/29/2021   ALBUMIN 3.9 08/29/2021   ALKPHOS 66 08/29/2021    Electrolytes Lab Results  Component Value Date   NA 139 02/19/2022   K 3.8 02/19/2022   CL 102 02/19/2022   CALCIUM 9.4 02/19/2022   MG 2.2 09/07/2018    Bone Lab Results  Component Value Date   VD25OH 54.97 01/24/2021   25OHVITD1 8.1 (L) 09/07/2018   25OHVITD2 <1.0 09/07/2018   25OHVITD3 8.1  09/07/2018    Inflammation (CRP: Acute Phase) (ESR: Chronic Phase) Lab Results  Component Value Date   CRP <1.0 01/24/2021   ESRSEDRATE 37 (H) 01/24/2021         Note: Above Lab results reviewed.   Assessment  The primary encounter diagnosis was Lumbar facet arthropathy. Diagnoses of Lumbar facet syndrome (Bilateral) (R>L), Spondylosis without myelopathy or radiculopathy, cervical region, and Chronic pain syndrome were also pertinent to this visit.  Plan of Care  Problem-specific:  No problem-specific Assessment & Plan notes found for this encounter.  Ms. Yasaman C Phillipson has a current medication list which includes the following long-term medication(s): albuterol, amlodipine, atorvastatin, cetirizine, dicyclomine, diphenhydramine, duloxetine, fluticasone, furosemide, hydrochlorothiazide, losartan, methocarbamol, montelukast, pantoprazole, potassium chloride sa, and pregabalin.  Positive diagnostic lumbar facet medial branch nerve block #1.  See post procedure details above.  Plan: repeat lumbar facet medial branch nerve blocks #2, consider RFA after  Orders:  Orders Placed This Encounter  Procedures   LUMBAR FACET(MEDIAL BRANCH NERVE BLOCK) MBNB    Standing Status:   Future    Standing Expiration Date:   09/18/2022    Scheduling Instructions:     Procedure: Lumbar facet block (AKA.: Lumbosacral medial branch nerve block)     Side: Bilateral     Level: L3-4 & L5-S1 Facets ( L3, L4, L5,  Medial Branch Nerves)     Sedation: IV Versed     Timeframe: ASAA    Order Specific Question:   Where will this procedure be performed?    Answer:   [facility]   Follow-up plan:   Return   in about 2 weeks (around 07/02/2022) for B/L L3, 4, 5 MBNB #2, in clinic IV Versed.    Recent Visits Date Type Provider Dept  05/19/22 Procedure visit Gillis Santa, MD [facility]  04/30/22 Office Visit Gillis Santa, MD [facility]  03/31/22 Procedure visit Gillis Santa, MD  [facility]  Showing recent visits within past 90 days and meeting all other requirements Today's Visits Date Type Provider Dept  06/18/22 Office Visit Gillis Santa, MD [facility]  Showing today's visits and meeting all other requirements Future Appointments Date Type Provider Dept  07/08/22 Appointment Gillis Santa, MD [facility]  Showing future appointments within next 90 days and meeting all other requirements  I discussed the assessment and treatment plan with the patient. The patient was provided an opportunity to ask questions and all were answered. The patient agreed with the plan and demonstrated an understanding of the instructions.  Patient advised to call back or seek an in-person evaluation if the symptoms or condition worsens.  Duration of encounter: 94mnutes.  Note by: BGillis Santa MD Date: 06/18/2022; Time: 4:00 PM

## 2022-06-24 ENCOUNTER — Encounter: Payer: 59 | Admitting: Family Medicine

## 2022-06-25 ENCOUNTER — Encounter: Payer: Self-pay | Admitting: Student in an Organized Health Care Education/Training Program

## 2022-06-30 ENCOUNTER — Ambulatory Visit
Admission: RE | Admit: 2022-06-30 | Discharge: 2022-06-30 | Disposition: A | Discharge status: Home / Self Care | Payer: 59 | Source: Ambulatory Visit | Attending: Family | Admitting: Family

## 2022-06-30 ENCOUNTER — Encounter: Payer: Self-pay | Admitting: Family

## 2022-06-30 ENCOUNTER — Ambulatory Visit (INDEPENDENT_AMBULATORY_CARE_PROVIDER_SITE_OTHER): Payer: 59 | Admitting: Family

## 2022-06-30 ENCOUNTER — Telehealth: Payer: Self-pay | Admitting: Family

## 2022-06-30 VITALS — BP 138/84 | HR 62 | Temp 98.5°F | Ht 63.0 in | Wt 244.4 lb

## 2022-06-30 DIAGNOSIS — R350 Frequency of micturition: Secondary | ICD-10-CM

## 2022-06-30 DIAGNOSIS — R1030 Lower abdominal pain, unspecified: Secondary | ICD-10-CM | POA: Diagnosis not present

## 2022-06-30 DIAGNOSIS — R109 Unspecified abdominal pain: Secondary | ICD-10-CM | POA: Diagnosis not present

## 2022-06-30 DIAGNOSIS — R7303 Prediabetes: Secondary | ICD-10-CM | POA: Diagnosis not present

## 2022-06-30 DIAGNOSIS — R1032 Left lower quadrant pain: Secondary | ICD-10-CM | POA: Diagnosis not present

## 2022-06-30 LAB — URINALYSIS, ROUTINE W REFLEX MICROSCOPIC
Bilirubin Urine: NEGATIVE
Hgb urine dipstick: NEGATIVE
Ketones, ur: NEGATIVE
Leukocytes,Ua: NEGATIVE
Nitrite: NEGATIVE
RBC / HPF: NONE SEEN (ref 0–?)
Specific Gravity, Urine: 1.015 (ref 1.000–1.030)
Total Protein, Urine: NEGATIVE
Urine Glucose: NEGATIVE
Urobilinogen, UA: 0.2 (ref 0.0–1.0)
WBC, UA: NONE SEEN (ref 0–?)
pH: 6.5 (ref 5.0–8.0)

## 2022-06-30 LAB — COMPREHENSIVE METABOLIC PANEL
ALT: 13 U/L (ref 0–35)
AST: 13 U/L (ref 0–37)
Albumin: 4.1 g/dL (ref 3.5–5.2)
Alkaline Phosphatase: 65 U/L (ref 39–117)
BUN: 18 mg/dL (ref 6–23)
CO2: 28 mEq/L (ref 19–32)
Calcium: 9.5 mg/dL (ref 8.4–10.5)
Chloride: 101 mEq/L (ref 96–112)
Creatinine, Ser: 0.81 mg/dL (ref 0.40–1.20)
GFR: 85.51 mL/min (ref >60.00)
Glucose, Bld: 113 mg/dL — ABNORMAL HIGH (ref 70–99)
Potassium: 3.7 mEq/L (ref 3.5–5.1)
Sodium: 137 mEq/L (ref 135–145)
Total Bilirubin: 0.7 mg/dL (ref 0.2–1.2)
Total Protein: 7.2 g/dL (ref 6.0–8.3)

## 2022-06-30 LAB — CBC WITH DIFFERENTIAL/PLATELET
Basophils Absolute: 0 10*3/uL (ref 0.0–0.1)
Basophils Relative: 0.5 % (ref 0.0–3.0)
Eosinophils Absolute: 0.1 10*3/uL (ref 0.0–0.7)
Eosinophils Relative: 1.1 % (ref 0.0–5.0)
HCT: 36.7 % (ref 36.0–46.0)
Hemoglobin: 12.3 g/dL (ref 12.0–15.0)
Lymphocytes Relative: 43.8 % (ref 12.0–46.0)
Lymphs Abs: 2.2 10*3/uL (ref 0.7–4.0)
MCHC: 33.4 g/dL (ref 30.0–36.0)
MCV: 98.5 fl (ref 78.0–100.0)
Monocytes Absolute: 0.3 10*3/uL (ref 0.1–1.0)
Monocytes Relative: 6.7 % (ref 3.0–12.0)
Neutro Abs: 2.4 10*3/uL (ref 1.4–7.7)
Neutrophils Relative %: 47.9 % (ref 43.0–77.0)
Platelets: 266 10*3/uL (ref 150.0–400.0)
RBC: 3.73 Mil/uL — ABNORMAL LOW (ref 3.87–5.11)
RDW: 13.6 % (ref 11.5–15.5)
WBC: 5 10*3/uL (ref 4.0–10.5)

## 2022-06-30 LAB — HEMOGLOBIN A1C: Hgb A1c MFr Bld: 6.1 % (ref 4.6–6.5)

## 2022-06-30 LAB — POCT I-STAT CREATININE: Creatinine, Ser: 0.9 mg/dL (ref 0.44–1.00)

## 2022-06-30 LAB — LIPASE: Lipase: 32 U/L (ref 11.0–59.0)

## 2022-06-30 MED ORDER — IOHEXOL 350 MG/ML SOLN
100.0000 mL | Freq: Once | INTRAVENOUS | Status: AC | PRN
  Administered 2022-06-30: 100 mL via INTRAVENOUS

## 2022-06-30 NOTE — Progress Notes (Signed)
Subjective:    Patient ID: Anita Gibbs, female    DOB: [birth year], 51 y.o.   MRN: [id number]  CC: Anita Gibbs is a 51 y.o. female who presents today for an acute visit.    HPI: Lower left sided abdominal pain in the evening, approx 7pm,  stabbing  pain. Eats dinner around 5:30pm.  Occasionally she will have pain 2 pm during the day.  Episodes started 2-3 months ago, occurring nightly.  She will take robaxin and gabapentin and symptom easing pain off.         Urinary more frequently. Denies nausea, vomiting, chills, fever, dysuria, hematuria, changes to vaginal discharge.  No concerns for STD at this time   She is not taking Bentyl for abdominal cramping currently.   H/o gerd.  She is compliant with protonix '40mg'$  qam prior to breakfast.   History of abdominal hysterectomy d/t uterine fibroids.   Non-smoker.  Occasional alcohol  Colonoscopy reported in 2021 and to repeat in 5 years.  Unable to see this report  CT abdomen/ pelvis performed 06/19/2021 for follow-up left adrenal gland adenoma.  No acute findings   She follows with Dr. Holley Raring for pain management  No h/o CKD, PUD.   Saw PCP for flank pain 01/28/22. Treated with cipro and flagyl without improvement. She never started bentyl.  Screened for STIs which were negative.  HISTORY:  Past Medical History:  Diagnosis Date   Arthritis    Carpal boss, right    Carpal tunnel syndrome, bilateral    Headache    Hives 09/05/2020   Hypertension    Seasonal asthma 11/29/2018   Past Surgical History:  Procedure Laterality Date   ABDOMINAL HYSTERECTOMY  10/17/2020   [facility]; she has both ovaries   ANTERIOR CERVICAL DECOMP/DISCECTOMY FUSION N/A 07/01/2017   Procedure: ANTERIOR CERVICAL DECOMPRESSION/DISCECTOMY FUSION 3 LEVELS;  Surgeon: Meade Maw, MD;  Location: [facility];  Service: Neurosurgery;  Laterality: N/A;   CARPAL TUNNEL RELEASE Bilateral    KNEE ARTHROSCOPY Right    Family History  Problem Relation  Age of Onset   [name] Mother    Diabetes Mother    Arthritis Mother    Hypertension Mother    Hyperlipidemia Mother    Hypertension Father    Arthritis Father    Hearing loss Father    Kidney disease Father    Lymphoma Father        large b cell    Arthritis Sister    Hypertension Sister    Cancer Brother        ? lung due to exposure    COPD Brother    Early death Brother    COPD Son    Depression Son    Breast cancer Maternal Aunt    Breast cancer Paternal Aunt    Sleep apnea Daughter    Allergic rhinitis Neg Hx    Angioedema Neg Hx    Eczema Neg Hx    Urticaria Neg Hx    Asthma Neg Hx     Allergies: Other, Pineapple, Shellfish allergy, Strawberry (diagnostic), and Tomato Current Outpatient Medications on File Prior to Visit  Medication Sig Dispense Refill   albuterol (VENTOLIN HFA) 108 (90 Base) MCG/ACT inhaler Inhale 2 puffs into the lungs every 6 (six) hours as needed for wheezing or shortness of breath. 1 each 11   amLODipine (NORVASC) 5 MG tablet Take 1 tablet (5 mg total) by mouth daily. 90 tablet 3   atorvastatin (LIPITOR) 10 MG  tablet MWF at night 90 tablet 3   buprenorphine (BUTRANS) 7.5 MCG/HR Place 1 patch onto the skin once a week. 4 patch 2   cetirizine (ZYRTEC ALLERGY) 10 MG tablet Take 1 tablet (10 mg total) by mouth at bedtime as needed. 90 tablet 3   Cholecalciferol (VITAMIN D-3) 125 MCG (5000 UT) TABS Take by mouth daily.     Cyanocobalamin (B-12 PO) Take by mouth.     dicyclomine (BENTYL) 20 MG tablet Take 1 tablet (20 mg total) by mouth 4 (four) times daily -  before meals and at bedtime. 120 tablet 0   diphenhydrAMINE (BENADRYL) 25 mg capsule Take 25 mg by mouth every 6 (six) hours as needed (for allergies.).     DULoxetine (CYMBALTA) 20 MG capsule Take 1 capsule (20 mg total) by mouth daily. 90 capsule 3   Elastic Bandages & Supports (LUMBAR BACK BRACE/SUPPORT PAD) MISC 1 lumbar back brace for lumbar facet pain 1 each 0   EPINEPHrine (AUVI-Q) 0.3  mg/0.3 mL IJ SOAJ injection Use as directed for severe allergic reaction 2 Device 2   fluticasone (FLONASE) 50 MCG/ACT nasal spray Place 2 sprays into both nostrils daily. 16 g 6   furosemide (LASIX) 20 MG tablet Take 1 tablet (20 mg total) by mouth daily as needed. X 5-7 days 30 tablet 11   hydrochlorothiazide (HYDRODIURIL) 25 MG tablet TAKE 1/2 TABLET EVERY MORNING 90 tablet 3   hydrocortisone 2.5 % cream Apply topically 2 (two) times daily. Prn to eyelids 30 g 0   losartan (COZAAR) 100 MG tablet Take 1 tablet (100 mg total) by mouth daily. 90 tablet 3   meloxicam (MOBIC) 15 MG tablet Take 1 tablet (15 mg total) by mouth every other day as needed. 45 tablet 11   methocarbamol (ROBAXIN) 750 MG tablet Take 1 tablet (750 mg total) by mouth every 8 (eight) hours as needed for muscle spasms. 90 tablet 11   montelukast (SINGULAIR) 10 MG tablet Take 1 tablet (10 mg total) by mouth at bedtime. 90 tablet 3   Olopatadine HCl 0.2 % SOLN Apply 1 drop to eye daily. Both eyes 2.5 mL 11   pantoprazole (PROTONIX) 40 MG tablet TAKE ONE TABLET EVERY DAY 30 MIN BEFORE DINNER 90 tablet 3   potassium chloride SA (KLOR-CON M) 20 MEQ tablet Take 1 tablet (20 mEq total) by mouth daily as needed. With lasix 30 tablet 11   pregabalin (LYRICA) 75 MG capsule Take 1 capsule (75 mg total) by mouth 3 (three) times daily. 90 capsule 11   No current facility-administered medications on file prior to visit.    Social History   Tobacco Use   Smoking status: Never   Smokeless tobacco: Never  Vaping Use   Vaping Use: Never used  Substance Use Topics   Alcohol use: Yes    Alcohol/week: 3.0 standard drinks of alcohol    Types: 3 Standard drinks or equivalent per week    Comment: occ   Drug use: No    Review of Systems  Constitutional:  Negative for chills and fever.  Respiratory:  Negative for cough.   Cardiovascular:  Negative for chest pain and palpitations.  Gastrointestinal:  Positive for abdominal pain. Negative  for blood in stool, constipation, nausea and vomiting.  Musculoskeletal:  Positive for back pain.      Objective:    BP 138/84 (BP Location: Left Arm, Patient Position: Sitting, Cuff Size: Normal)   Pulse 62   Temp 98.5 F (36.9 C) (  Oral)   Ht '5\' 3"'$  (1.6 m)   Wt 244 lb 6.4 oz (110.9 kg)   LMP  (LMP Unknown)   SpO2 99%   BMI 43.29 kg/m    Physical Exam Vitals reviewed.  Constitutional:      Appearance: Normal appearance. She is well-developed.  Eyes:     Conjunctiva/sclera: Conjunctivae normal.  Cardiovascular:     Rate and Rhythm: Normal rate and regular rhythm.     Pulses: Normal pulses.     Heart sounds: Normal heart sounds.  Pulmonary:     Effort: Pulmonary effort is normal.     Breath sounds: Normal breath sounds. No wheezing, rhonchi or rales.  Abdominal:     General: Bowel sounds are normal. There is no distension.     Palpations: Abdomen is soft. Abdomen is not rigid. There is no fluid wave or mass.     Tenderness: There is abdominal tenderness in the right lower quadrant and left lower quadrant. There is no right CVA tenderness, left CVA tenderness, guarding or rebound. Negative signs include McBurney's sign.     Comments: Diffuse lower abdominal tenderness.  Not exquisite.  Skin:    General: Skin is warm and dry.  Neurological:     Mental Status: She is alert.  Psychiatric:        Speech: Speech normal.        Behavior: Behavior normal.        Thought Content: Thought content normal.        Assessment & Plan:    Problem List Items Addressed This Visit       Other   Abdominal pain - Primary    Duration 3 months.  Patient nontoxic in appearance.  Diffuse lower abdominal pain.  Pending labs, urine,  CT abdomen pelvis.  Differential includes IBS, GERD, diverticulitis, cholelithiasis.  Advised to add on Pepcid AC 20 mg over-the-counter to Protonix 40 mg every morning.  She did not start Bentyl as previously prescribed by Dr Aundra Dubin.  May consider Bentyl  after we receive results of CT abdomen pelvis.  Call over to [facility] gastroenterology to request endoscopy, colonoscopy from 2021      Relevant Orders   Urinalysis, Routine w reflex microscopic   Comprehensive metabolic panel   CBC with Differential/Platelet   Lipase   CT ABDOMEN PELVIS W CONTRAST   Abdominal pain, left lower quadrant   Prediabetes   Relevant Orders   Hemoglobin A1c   Other Visit Diagnoses     Urine frequency       Relevant Orders   Urine Culture        I have discontinued Manda C. Mally's metroNIDAZOLE, Insulin Pen Needle, Minna Antis, [place], and Reagen.Frieze. I am also having her maintain her diphenhydrAMINE, EPINEPHrine, Vitamin D-3, fluticasone, hydrocortisone, Olopatadine HCl, Lumbar Back Brace/Support Pad, Cyanocobalamin (B-12 PO), pregabalin, methocarbamol, meloxicam, dicyclomine, potassium chloride SA, pantoprazole, montelukast, losartan, hydrochlorothiazide, furosemide, DULoxetine, cetirizine, atorvastatin, amLODipine, albuterol, and buprenorphine.   No orders of the defined types were placed in this encounter.   Return precautions given.   Risks, benefits, and alternatives of the medications and treatment plan prescribed today were discussed, and patient expressed understanding.   Education regarding symptom management and diagnosis given to patient on AVS.  Continue to follow with Carollee Leitz, MD for routine health maintenance.   [name] and I agreed with plan.   Mable Paris, FNP

## 2022-06-30 NOTE — Patient Instructions (Addendum)
Continue Protonix from 40 mg daily in the morning   Please add on Pepcid AC 20 mg taken at bedtime.  You may find this over-the-counter.   I think it is reasonable to start Bentyl if CT abdomen and pelvis is normal.  We also will consider GI referral.  We will call you with results of CT abdomen/pelvis.  If it is critical you hear from today.  Otherwise you will hear from you in the next couple of days once the full result is available.   Please let me know how you are doing.

## 2022-06-30 NOTE — Assessment & Plan Note (Addendum)
Duration 3 months.  Patient nontoxic in appearance.  Diffuse lower abdominal pain.  Pending labs, urine,  CT abdomen pelvis.  Differential includes IBS, GERD, diverticulitis, cholelithiasis.  Advised to add on Pepcid AC 20 mg over-the-counter to Protonix 40 mg every morning.  She did not start Bentyl as previously prescribed by Dr Aundra Dubin.  May consider Bentyl after we receive results of CT abdomen pelvis.  Call over to [facility] gastroenterology to request endoscopy, colonoscopy from 2021

## 2022-06-30 NOTE — Telephone Encounter (Signed)
Spoke to [place] and gave her our fax she stated she will be sending them shortly

## 2022-06-30 NOTE — Telephone Encounter (Signed)
Call [facility] and request colonoscopy and endoscopy report

## 2022-07-01 LAB — URINE CULTURE
MICRO NUMBER:: 14118182
SPECIMEN QUALITY:: ADEQUATE

## 2022-07-02 ENCOUNTER — Other Ambulatory Visit: Payer: Self-pay | Admitting: Family

## 2022-07-02 ENCOUNTER — Encounter: Payer: Self-pay | Admitting: Family

## 2022-07-02 ENCOUNTER — Telehealth: Payer: Self-pay | Admitting: Student in an Organized Health Care Education/Training Program

## 2022-07-02 DIAGNOSIS — R109 Unspecified abdominal pain: Secondary | ICD-10-CM

## 2022-07-02 MED ORDER — DICYCLOMINE HCL 20 MG PO TABS: 20.0000 mg | ORAL_TABLET | Freq: Three times a day (TID) | ORAL | 0 refills | Status: DC

## 2022-07-02 NOTE — Progress Notes (Signed)
close

## 2022-07-02 NOTE — Telephone Encounter (Signed)
PT has an appt for Monday, PT wants to know what can /can't do before the procedures. PT asked if the notes can be put into her my chart, due to her being at work. Thanks

## 2022-07-07 ENCOUNTER — Encounter: Payer: Self-pay | Admitting: Student in an Organized Health Care Education/Training Program

## 2022-07-07 ENCOUNTER — Ambulatory Visit
Payer: 59 | Attending: Student in an Organized Health Care Education/Training Program | Admitting: Student in an Organized Health Care Education/Training Program

## 2022-07-07 ENCOUNTER — Ambulatory Visit
Admission: RE | Admit: 2022-07-07 | Discharge: 2022-07-07 | Disposition: A | Discharge status: Home / Self Care | Payer: 59 | Source: Ambulatory Visit | Attending: Student in an Organized Health Care Education/Training Program | Admitting: Student in an Organized Health Care Education/Training Program

## 2022-07-07 DIAGNOSIS — M47816 Spondylosis without myelopathy or radiculopathy, lumbar region: Secondary | ICD-10-CM | POA: Diagnosis not present

## 2022-07-07 DIAGNOSIS — M47812 Spondylosis without myelopathy or radiculopathy, cervical region: Secondary | ICD-10-CM | POA: Diagnosis not present

## 2022-07-07 MED ORDER — LIDOCAINE HCL 2 % IJ SOLN
20.0000 mL | Freq: Once | INTRAMUSCULAR | Status: AC
  Administered 2022-07-07: 400 mg

## 2022-07-07 MED ORDER — MIDAZOLAM HCL 2 MG/2ML IJ SOLN
0.5000 mg | Freq: Once | INTRAMUSCULAR | Status: AC
  Administered 2022-07-07: 1.5 mg via INTRAVENOUS

## 2022-07-07 MED ORDER — ROPIVACAINE HCL 2 MG/ML IJ SOLN
INTRAMUSCULAR | Status: AC
  Filled 2022-07-07: qty 20

## 2022-07-07 MED ORDER — DEXAMETHASONE SODIUM PHOSPHATE 10 MG/ML IJ SOLN
10.0000 mg | Freq: Once | INTRAMUSCULAR | Status: AC
  Administered 2022-07-07: 10 mg

## 2022-07-07 MED ORDER — LIDOCAINE HCL 2 % IJ SOLN
INTRAMUSCULAR | Status: AC
  Filled 2022-07-07: qty 20

## 2022-07-07 MED ORDER — DEXAMETHASONE SODIUM PHOSPHATE 10 MG/ML IJ SOLN
INTRAMUSCULAR | Status: AC
  Filled 2022-07-07: qty 2

## 2022-07-07 MED ORDER — MIDAZOLAM HCL 2 MG/2ML IJ SOLN
INTRAMUSCULAR | Status: AC
  Filled 2022-07-07: qty 2

## 2022-07-07 MED ORDER — LACTATED RINGERS IV SOLN: Freq: Once | INTRAVENOUS | Status: AC

## 2022-07-07 NOTE — Patient Instructions (Signed)

## 2022-07-07 NOTE — Progress Notes (Signed)
PROVIDER NOTE: Interpretation of information contained herein should be left to medically-trained personnel. Specific patient instructions are provided elsewhere under "Patient Instructions" section of medical record. This document was created in part using STT-dictation technology, any transcriptional errors that may result from this process are unintentional.  Patient: Anita Gibbs Type: Established DOB: 1971-04-16 MRN: [id number] PCP: Carollee Leitz, MD  Service: Procedure DOS: 07/07/2022 Setting: Ambulatory Location: Ambulatory outpatient facility Delivery: Face-to-face Provider: Gillis Santa, MD Specialty: Interventional Pain Management Specialty designation: 09 Location: Outpatient facility Ref. Prov.: Gillis Santa, MD    Procedure:           Type: Lumbar Facet, Medial Branch Block(s) #2  Laterality: Bilateral  Level: L3, L4, L5,  Medial Branch Level(s). Injecting these levels blocks the L3-4 and L4-5 lumbar facet joints. Imaging: Fluoroscopic guidance         Anesthesia: Local anesthesia (1-2% Lidocaine) Anxiolysis: IV Versed         Sedation:    minimal          DOS: 07/07/2022 Performed by: Gillis Santa, MD  Primary Purpose: Diagnostic/Therapeutic Indications: Low back pain severe enough to impact quality of life or function. 1. Lumbar facet arthropathy   2. Spondylosis without myelopathy or radiculopathy, cervical region   3. Lumbar facet syndrome (Bilateral) (R>L)    NAS-11 Pain score:   Pre-procedure: 9 /10   Post-procedure: 2 /10     Position / Prep / Materials:  Position: Prone  Prep solution: DuraPrep (Iodine Povacrylex [0.7% available iodine] and Isopropyl Alcohol, 74% w/w) Area Prepped: Posterolateral Lumbosacral Spine (Wide prep: From the lower border of the scapula down to the end of the tailbone and from flank to flank.)  Materials:  Tray: Block Needle(s):  Type: Spinal  Gauge (G): 22  Length: 3.5-in Qty: 2     Pre-op H&P Assessment:  Anita Gibbs  is a 51 y.o. (year old), female patient, seen today for interventional treatment. She  has a past surgical history that includes Knee arthroscopy (Right); Anterior cervical decomp/discectomy fusion (N/A, 07/01/2017); Abdominal hysterectomy (10/17/2020); and Carpal tunnel release (Bilateral). Ms. Amparo has a current medication list which includes the following prescription(s): albuterol, amlodipine, atorvastatin, buprenorphine, cetirizine, vitamin d-3, cyanocobalamin, dicyclomine, diphenhydramine, duloxetine, lumbar back brace/support pad, epinephrine, fluticasone, furosemide, hydrochlorothiazide, hydrocortisone, losartan, meloxicam, methocarbamol, montelukast, olopatadine hcl, pantoprazole, potassium chloride sa, and pregabalin, and the following Facility-Administered Medications: lactated ringers. Her primarily concern today is the Back Pain (lower)  Initial Vital Signs:  Pulse/HCG Rate: 63ECG Heart Rate: (!) 57 Temp:  (!) 97.5 F (36.4 C) Resp: 16 BP: (!) 144/79 SpO2: 100 %  BMI: Estimated body mass index is 43.22 kg/m as calculated from the following:   Height as of this encounter: '5\' 3"'$  (1.6 m).   Weight as of this encounter: 244 lb (110.7 kg).  Risk Assessment: Allergies: Reviewed. She is allergic to other, pineapple, shellfish allergy, strawberry (diagnostic), and tomato.  Allergy Precautions: None required Coagulopathies: Reviewed. None identified.  Blood-thinner therapy: None at this time Active Infection(s): Reviewed. None identified. Anita Gibbs is afebrile  Site Confirmation: Ms. Kuennen was asked to confirm the procedure and laterality before marking the site Procedure checklist: Completed Consent: Before the procedure and under the influence of no sedative(s), amnesic(s), or anxiolytics, the patient was informed of the treatment options, risks and possible complications. To fulfill our ethical and legal obligations, as recommended by the American Medical Association's Code of  Ethics, I have informed the patient of my clinical impression; the nature  and purpose of the treatment or procedure; the risks, benefits, and possible complications of the intervention; the alternatives, including doing nothing; the risk(s) and benefit(s) of the alternative treatment(s) or procedure(s); and the risk(s) and benefit(s) of doing nothing. The patient was provided information about the general risks and possible complications associated with the procedure. These may include, but are not limited to: failure to achieve desired goals, infection, bleeding, organ or nerve damage, allergic reactions, paralysis, and death. In addition, the patient was informed of those risks and complications associated to Spine-related procedures, such as failure to decrease pain; infection (i.e.: Meningitis, epidural or intraspinal abscess); bleeding (i.e.: epidural hematoma, subarachnoid hemorrhage, or any other type of intraspinal or peri-dural bleeding); organ or nerve damage (i.e.: Any type of peripheral nerve, nerve root, or spinal cord injury) with subsequent damage to sensory, motor, and/or autonomic systems, resulting in permanent pain, numbness, and/or weakness of one or several areas of the body; allergic reactions; (i.e.: anaphylactic reaction); and/or death. Furthermore, the patient was informed of those risks and complications associated with the medications. These include, but are not limited to: allergic reactions (i.e.: anaphylactic or anaphylactoid reaction(s)); adrenal axis suppression; blood sugar elevation that in diabetics may result in ketoacidosis or comma; water retention that in patients with history of congestive heart failure may result in shortness of breath, pulmonary edema, and decompensation with resultant heart failure; weight gain; swelling or edema; medication-induced neural toxicity; particulate matter embolism and blood vessel occlusion with resultant organ, and/or nervous system  infarction; and/or aseptic necrosis of one or more joints. Finally, the patient was informed that Medicine is not an exact science; therefore, there is also the possibility of unforeseen or unpredictable risks and/or possible complications that may result in a catastrophic outcome. The patient indicated having understood very clearly. We have given the patient no guarantees and we have made no promises. Enough time was given to the patient to ask questions, all of which were answered to the patient's satisfaction. Ms. Spittler has indicated that she wanted to continue with the procedure. Attestation: I, the ordering provider, attest that I have discussed with the patient the benefits, risks, side-effects, alternatives, likelihood of achieving goals, and potential problems during recovery for the procedure that I have provided informed consent. Date  Time: 07/07/2022  8:22 AM  Pre-Procedure Preparation:  Monitoring: As per clinic protocol. Respiration, ETCO2, SpO2, BP, heart rate and rhythm monitor placed and checked for adequate function Safety Precautions: Patient was assessed for positional comfort and pressure points before starting the procedure. Time-out: I initiated and conducted the "Time-out" before starting the procedure, as per protocol. The patient was asked to participate by confirming the accuracy of the "Time Out" information. Verification of the correct person, site, and procedure were performed and confirmed by me, the nursing staff, and the patient. "Time-out" conducted as per Joint Commission's Universal Protocol (UP.01.01.01). Time: 6962  Description of Procedure:          Laterality: Bilateral. The procedure was performed in identical fashion on both sides. Targeted Levels: L3, L4, L5,Medial Branch Level(s)  Safety Precautions: Aspiration looking for blood return was conducted prior to all injections. At no point did we inject any substances, as a needle was being advanced. Before  injecting, the patient was told to immediately notify me if she was experiencing any new onset of "ringing in the ears, or metallic taste in the mouth". No attempts were made at seeking any paresthesias. Safe injection practices and needle disposal techniques used. Medications  properly checked for expiration dates. SDV (single dose vial) medications used. After the completion of the procedure, all disposable equipment used was discarded in the proper designated medical waste containers. Local Anesthesia: Protocol guidelines were followed. The patient was positioned over the fluoroscopy table. The area was prepped in the usual manner. The time-out was completed. The target area was identified using fluoroscopy. A 12-in long, straight, sterile hemostat was used with fluoroscopic guidance to locate the targets for each level blocked. Once located, the skin was marked with an approved surgical skin marker. Once all sites were marked, the skin (epidermis, dermis, and hypodermis), as well as deeper tissues (fat, connective tissue and muscle) were infiltrated with a small amount of a short-acting local anesthetic, loaded on a 10cc syringe with a 25G, 1.5-in  Needle. An appropriate amount of time was allowed for local anesthetics to take effect before proceeding to the next step. Local Anesthetic: Lidocaine 2.0% The unused portion of the local anesthetic was discarded in the proper designated containers. Technical description of process:  L3 Medial Branch Nerve Block (MBB): The target area for the L3 medial branch is at the junction of the postero-lateral aspect of the superior articular process and the superior, posterior, and medial edge of the transverse process of L4. Under fluoroscopic guidance, a Quincke needle was inserted until contact was made with os over the superior postero-lateral aspect of the pedicular shadow (target area). After negative aspiration for blood,68m of the nerve block solution was injected  without difficulty or complication. The needle was removed intact. L4 Medial Branch Nerve Block (MBB): The target area for the L4 medial branch is at the junction of the postero-lateral aspect of the superior articular process and the superior, posterior, and medial edge of the transverse process of L5. Under fluoroscopic guidance, a Quincke needle was inserted until contact was made with os over the superior postero-lateral aspect of the pedicular shadow (target area). After negative aspiration for blood, 251mof the nerve block solution was injected without difficulty or complication. The needle was removed intact. L5 Medial Branch Nerve Block (MBB): The target area for the L5 medial branch is at the junction of the postero-lateral aspect of the superior articular process and the superior, posterior, and medial edge of the sacral ala. Under fluoroscopic guidance, a Quincke needle was inserted until contact was made with os over the superior postero-lateral aspect of the pedicular shadow (target area). After negative aspiration for blood, 79m35mf the nerve block solution was injected without difficulty or complication. The needle was removed intact.   12 cc solution made of 10 cc of 0.2% ropivacaine, 2 cc of Decadron 10 mg/cc.  2 cc injected at each level above bilaterally.    Once the entire procedure was completed, the treated area was cleaned, making sure to leave some of the prepping solution back to take advantage of its long term bactericidal properties.         Illustration of the posterior view of the lumbar spine and the posterior neural structures. Laminae of L2 through S1 are labeled. DPRL5, dorsal primary ramus of L5; DPRS1, dorsal primary ramus of S1; DPR3, dorsal primary ramus of L3; FJ, facet (zygapophyseal) joint L3-L4; I, inferior articular process of L4; LB1, lateral branch of dorsal primary ramus of L1; IAB, inferior articular branches from L3 medial branch (supplies L4-L5 facet  joint); IBP, intermediate branch plexus; MB3, medial branch of dorsal primary ramus of L3; NR3, third lumbar nerve root; S, superior articular process  of L5; SAB, superior articular branches from L4 (supplies L4-5 facet joint also); TP3, transverse process of L3.  Vitals:   07/07/22 0848 07/07/22 0852 07/07/22 0857 07/07/22 0902  BP: 122/79 124/82 114/78 129/83  Pulse:    66  Resp: '20 18 18 14  '$ Temp:      TempSrc:      SpO2: 98% 99% 100% 100%  Weight:      Height:         Start Time: 0847 hrs. End Time: 0857 hrs.  Imaging Guidance (Spinal):          Type of Imaging Technique: Fluoroscopy Guidance (Spinal) Indication(s): Assistance in needle guidance and placement for procedures requiring needle placement in or near specific anatomical locations not easily accessible without such assistance. Exposure Time: Please see nurses notes. Contrast: None used. Fluoroscopic Guidance: I was personally present during the use of fluoroscopy. "Tunnel Vision Technique" used to obtain the best possible view of the target area. Parallax error corrected before commencing the procedure. "Direction-depth-direction" technique used to introduce the needle under continuous pulsed fluoroscopy. Once target was reached, antero-posterior, oblique, and lateral fluoroscopic projection used confirm needle placement in all planes. Images permanently stored in EMR. Interpretation: No contrast injected. I personally interpreted the imaging intraoperatively. Adequate needle placement confirmed in multiple planes. Permanent images saved into the patient's record.  Post-operative Assessment:  Post-procedure Vital Signs:  Pulse/HCG Rate: 66(!) 57 Temp: (!) 97.5 F (36.4 C) Resp: 14 BP: 129/83 SpO2: 100 %  EBL: None  Complications: No immediate post-treatment complications observed by team, or reported by patient.  Note: The patient tolerated the entire procedure well. A repeat set of vitals were taken after the  procedure and the patient was kept under observation following institutional policy, for this type of procedure. Post-procedural neurological assessment was performed, showing return to baseline, prior to discharge. The patient was provided with post-procedure discharge instructions, including a section on how to identify potential problems. Should any problems arise concerning this procedure, the patient was given instructions to immediately contact us, at any time, without hesitation. In any case, we plan to contact the patient by telephone for a follow-up status report regarding this interventional procedure.  Comments:  No additional relevant information.  Plan of Care  Orders:  Orders Placed This Encounter  Procedures   DG [facility] C-ARM 1-60 MIN NO REPORT    Intraoperative interpretation by procedural physician at [place].    Standing Status:   Standing    Number of Occurrences:   1    Order Specific Question:   Reason for exam:    Answer:   Assistance in needle guidance and placement for procedures requiring needle placement in or near specific anatomical locations not easily accessible without such assistance.    Medications ordered for procedure: Meds ordered this encounter  Medications   lidocaine (XYLOCAINE) 2 % (with pres) injection 400 mg   lactated ringers infusion   midazolam (VERSED) injection 0.5-2 mg    Make sure Flumazenil is available in the pyxis when using this medication. If oversedation occurs, administer 0.2 mg IV over 15 sec. If after 45 sec no response, administer 0.2 mg again over 1 min; may repeat at 1 min intervals; not to exceed 4 doses (1 mg)   dexamethasone (DECADRON) injection 10 mg   dexamethasone (DECADRON) injection 10 mg   Medications administered: We administered lidocaine, lactated ringers, midazolam, dexamethasone, and dexamethasone.  See the medical record for exact dosing, route,  and time of administration.  Follow-up plan:    Return in about 6 weeks (around 08/18/2022), or PPE, VV.     Recent Visits Date Type Provider Dept  06/18/22 Office Visit Gillis Santa, MD [facility]  05/19/22 Procedure visit Gillis Santa, MD [facility]  04/30/22 Office Visit Gillis Santa, MD [facility]  Showing recent visits within past 90 days and meeting all other requirements Today's Visits Date Type Provider Dept  07/07/22 Procedure visit Gillis Santa, MD [facility]  Showing today's visits and meeting all other requirements Future Appointments Date Type Provider Dept  07/08/22 Appointment Gillis Santa, MD [facility]  08/18/22 Appointment Gillis Santa, MD [facility]  Showing future appointments within next 90 days and meeting all other requirements  Disposition: Discharge home  Discharge (Date  Time): 07/07/2022;   hrs.   Primary Care Physician: Carollee Leitz, MD Location: [facility] Note by: Gillis Santa, MD Date: 07/07/2022; Time: 9:39 AM  Disclaimer:  Medicine is not an exact science. The only guarantee in medicine is that nothing is guaranteed. It is important to note that the decision to proceed with this intervention was based on the information collected from the patient. The Data and conclusions were drawn from the patient's questionnaire, the interview, and the physical examination. Because the information was provided in large part by the patient, it cannot be guaranteed that it has not been purposely or unconsciously manipulated. Every effort has been made to obtain as much relevant data as possible for this evaluation. It is important to note that the conclusions that lead to this procedure are derived in large part from the available data. Always take into account that the treatment will also be dependent on availability of resources and existing treatment guidelines, considered by other Pain Management Practitioners as  being common knowledge and practice, at the time of the intervention. For Medico-Legal purposes, it is also important to point out that variation in procedural techniques and pharmacological choices are the acceptable norm. The indications, contraindications, technique, and results of the above procedure should only be interpreted and judged by a Board-Certified Interventional Pain Specialist with extensive familiarity and expertise in the same exact procedure and technique.

## 2022-07-08 ENCOUNTER — Ambulatory Visit
Payer: 59 | Attending: Student in an Organized Health Care Education/Training Program | Admitting: Student in an Organized Health Care Education/Training Program

## 2022-07-08 ENCOUNTER — Telehealth: Payer: Self-pay

## 2022-07-08 ENCOUNTER — Encounter: Payer: Self-pay | Admitting: Student in an Organized Health Care Education/Training Program

## 2022-07-08 DIAGNOSIS — G8929 Other chronic pain: Secondary | ICD-10-CM | POA: Insufficient documentation

## 2022-07-08 DIAGNOSIS — M7918 Myalgia, other site: Secondary | ICD-10-CM | POA: Diagnosis not present

## 2022-07-08 DIAGNOSIS — G894 Chronic pain syndrome: Secondary | ICD-10-CM | POA: Insufficient documentation

## 2022-07-08 DIAGNOSIS — M792 Neuralgia and neuritis, unspecified: Secondary | ICD-10-CM | POA: Diagnosis not present

## 2022-07-08 DIAGNOSIS — M79604 Pain in right leg: Secondary | ICD-10-CM | POA: Diagnosis not present

## 2022-07-08 DIAGNOSIS — M79605 Pain in left leg: Secondary | ICD-10-CM | POA: Diagnosis not present

## 2022-07-08 DIAGNOSIS — M5416 Radiculopathy, lumbar region: Secondary | ICD-10-CM | POA: Diagnosis not present

## 2022-07-08 MED ORDER — METHOCARBAMOL 750 MG PO TABS: 750.0000 mg | ORAL_TABLET | Freq: Three times a day (TID) | ORAL | 11 refills | Status: DC | PRN

## 2022-07-08 MED ORDER — PREGABALIN 75 MG PO CAPS: 75.0000 mg | ORAL_CAPSULE | Freq: Three times a day (TID) | ORAL | 11 refills | Status: DC

## 2022-07-08 MED ORDER — BUPRENORPHINE 7.5 MCG/HR TD PTWK: 1.0000 | MEDICATED_PATCH | TRANSDERMAL | 2 refills | Status: AC

## 2022-07-08 NOTE — Progress Notes (Signed)
PROVIDER NOTE: Information contained herein reflects review and annotations entered in association with encounter. Interpretation of such information and data should be left to medically-trained personnel. Information provided to patient can be located elsewhere in the medical record under "Patient Instructions". Document created using STT-dictation technology, any transcriptional errors that may result from process are unintentional.    Patient: Anita Gibbs  Service Category: E/M  Provider: Gillis Santa, MD  DOB: 03/29/1971  DOS: 07/08/2022  Referring Provider: Orland Mustard *  MRN: [id number]  Specialty: Interventional Pain Management  PCP: Carollee Leitz, MD  Type: Established Patient  Setting: Ambulatory outpatient    Location: Office  Delivery: Face-to-face     HPI  Anita Gibbs, a 51 y.o. year old female, is here today because of her No primary diagnosis found.. Ms. Kamphuis primary complain today is Back Pain (lower) Last encounter: [name] was on 07/07/2022. Pertinent problems: Ms. Mills has Lumbar facet arthropathy; Spondylosis without myelopathy or radiculopathy, lumbosacral region; and Morbid obesity with BMI of 45.0-49.9, adult ([place]) on their pertinent problem list. Pain Assessment: Severity of Chronic pain is reported as a 1 /10. Location: Back Lower/ . Onset: More than a month ago. Quality: Stabbing. Timing: Constant. Modifying factor(s): medications, heat. Vitals:  height is _0  (1.6 m) and weight is 244 lb (110.7 kg). Her temporal temperature is 97.2 F (36.2 C) (abnormal). Her blood pressure is 142/82 (abnormal) and her pulse is 52 (abnormal). Her respiration is 16 and oxygen saturation is 100%.   Reason for encounter: medication management.   Patient states that her low back is doing well after her bilateral facet medial branch nerve blocks performed yesterday. She states that her low back spasms are less today.  She is leaving for [employer].  She has a [age] on Thursday. She presents for Butrans refill.  She is doing well at 7.5 mcg transdermal patch.  No side effects.  Pharmacotherapy Assessment  Analgesic: Butrans 7.5 mcg transdermal patch.  Monitoring: [place] PMP: PDMP reviewed during this encounter.       Pharmacotherapy: No side-effects or adverse reactions reported. Compliance: No problems identified. Effectiveness: Clinically acceptable.  Landis Martins, RN  07/08/2022 10:39 AM  Sign when Signing Visit Nursing Pain Medication Assessment:  Safety precautions to be maintained throughout the outpatient stay will include: orient to surroundings, keep bed in low position, maintain call bell within reach at all times, provide assistance with transfer out of bed and ambulation.  Nursing Pain Medication Assessment:  Safety precautions to be maintained throughout the outpatient stay will include: orient to surroundings, keep bed in low position, maintain call bell within reach at all times, provide assistance with transfer out of bed and ambulation.  Medication Inspection Compliance: Ms. Sperry did not comply with our request to bring her pills to be counted. She was reminded that bringing the medication bottles, even when empty, is a requirement.  Medication: None brought in. Pill/Patch Count: None available to be counted. Bottle Appearance: No container available. Did not bring bottle(s) to appointment. Filled Date: N/A Last Medication intake:   last applied 7 days ago    No results found for: "CBDTHCR" No results found for: "D8THCCBX" No results found for: "D9THCCBX"  UDS:  Summary  Date Value Ref Range Status  02/01/2020 Note  Final    Comment:    ==================================================================== [facility] Select 13 (MW) ==================================================================== Test  Result       Flag       Units Drug Present not Declared for  Prescription Verification   Carboxy-THC                    20           UNEXPECTED ng/mg creat    Carboxy-THC is a metabolite of tetrahydrocannabinol (THC). Source of    THC is most commonly herbal marijuana or marijuana-based products,    but THC is also present in a scheduled prescription medication.    Trace amounts of THC can be present in hemp and cannabidiol (CBD)    products. This test is not intended to distinguish between delta-9-    tetrahydrocannabinol, the predominant form of THC in most herbal or    marijuana-based products, and delta-8-tetrahydrocannabinol. Drug Absent but Declared for Prescription Verification   Oxycodone                      Not Detected UNEXPECTED ng/mg creat ==================================================================== Test                      Result    Flag   Units      Ref Range   Creatinine              230              mg/dL      >=20 ==================================================================== Declared Medications:  The flagging and interpretation on this report are based on the  following declared medications.  Unexpected results may arise from  inaccuracies in the declared medications.  **Note: The testing scope of this panel includes these medications:  Oxycodone (Roxicodone)  **Note: The testing scope of this panel does not include the  following reported medications:  Albuterol (Ventolin HFA)  Amlodipine (Norvasc)  Atorvastatin (Lipitor)  Cetirizine  Diphenhydramine (Benadryl)  Epinephrine (EpiPen)  Fluticasone (Flonase)  Gabapentin (Neurontin)  Hydrochlorothiazide  Losartan (Cozaar)  Meloxicam (Mobic)  Methocarbamol (Robaxin)  Montelukast (Singulair)  Norethindrone (Aygestin)  Pantoprazole (Protonix)  Vitamin D3 ==================================================================== For clinical consultation, please call [phone]. ====================================================================        ROS  Constitutional: Denies any fever or chills Gastrointestinal: No reported hemesis, hematochezia, vomiting, or acute GI distress Musculoskeletal: Denies any acute onset joint swelling, redness, loss of ROM, or weakness Neurological: No reported episodes of acute onset apraxia, aphasia, dysarthria, agnosia, amnesia, paralysis, loss of coordination, or loss of consciousness  Medication Review  Cyanocobalamin, DULoxetine, EPINEPHrine, Lumbar Back Brace/Support Pad, Olopatadine HCl, Vitamin D-3, albuterol, amLODipine, atorvastatin, buprenorphine, cetirizine, dicyclomine, diphenhydrAMINE, fluticasone, furosemide, hydrochlorothiazide, hydrocortisone, losartan, meloxicam, methocarbamol, montelukast, pantoprazole, potassium chloride SA, and pregabalin  History Review  Allergy: Ms. Boss is allergic to other, pineapple, shellfish allergy, strawberry (diagnostic), and tomato. Drug: Ms. Wiers  reports no history of drug use. Alcohol:  reports current alcohol use of about 3.0 standard drinks of alcohol per week. Tobacco:  reports that she has never smoked. She has never used smokeless tobacco. Social: Ms. Gose  reports that she has never smoked. She has never used smokeless tobacco. She reports current alcohol use of about 3.0 standard drinks of alcohol per week. She reports that she does not use drugs. Medical:  has a past medical history of Arthritis, Carpal boss, right, Carpal tunnel syndrome, bilateral, Headache, Hives (09/05/2020), Hypertension, and Seasonal asthma (11/29/2018). Surgical: Ms. Aune  has a past surgical history that includes Knee arthroscopy (Right); Anterior  cervical decomp/discectomy fusion (N/A, 07/01/2017); Abdominal hysterectomy (10/17/2020); and Carpal tunnel release (Bilateral). Family: family history includes [name] in her mother; Arthritis in her father, mother, and sister; Breast cancer in her maternal aunt and paternal aunt; COPD in her brother and son; Cancer in  her brother; Depression in her son; Diabetes in her mother; Early death in her brother; Hearing loss in her father; Hyperlipidemia in her mother; Hypertension in her father, mother, and sister; Kidney disease in her father; Lymphoma in her father; Sleep apnea in her daughter.  Laboratory Chemistry Profile   Renal Lab Results  Component Value Date   BUN 18 06/30/2022   CREATININE 0.90 75/91/6384   BCR NOT APPLICABLE 66/59/9357   GFR 85.51 06/30/2022   GFRAA 76 09/07/2018   GFRNONAA 66 09/07/2018    Hepatic Lab Results  Component Value Date   AST 13 06/30/2022   ALT 13 06/30/2022   ALBUMIN 4.1 06/30/2022   ALKPHOS 65 06/30/2022   LIPASE 32.0 06/30/2022    Electrolytes Lab Results  Component Value Date   NA 137 06/30/2022   K 3.7 06/30/2022   CL 101 06/30/2022   CALCIUM 9.5 06/30/2022   MG 2.2 09/07/2018    Bone Lab Results  Component Value Date   VD25OH 54.97 01/24/2021   25OHVITD1 8.1 (L) 09/07/2018   25OHVITD2 <1.0 09/07/2018   25OHVITD3 8.1 09/07/2018    Inflammation (CRP: Acute Phase) (ESR: Chronic Phase) Lab Results  Component Value Date   CRP <1.0 01/24/2021   ESRSEDRATE 37 (H) 01/24/2021         Note: Above Lab results reviewed.  Recent Imaging Review  DG PAIN CLINIC C-ARM 1-60 MIN NO REPORT Fluoro was used, but no Radiologist interpretation will be provided.  Please refer to "NOTES" tab for provider progress note. Note: Reviewed        Physical Exam  General appearance: Well nourished, well developed, and well hydrated. In no apparent acute distress Mental status: Alert, oriented x 3 (person, place, & time)       Respiratory: No evidence of acute respiratory distress Eyes: PERLA Vitals: BP (!) 142/82   Pulse (!) 52   Temp (!) 97.2 F (36.2 C) (Temporal)   Resp 16   Ht _0  (1.6 m)   Wt 244 lb (110.7 kg)   LMP  (LMP Unknown)   SpO2 100%   BMI 43.22 kg/m  BMI: Estimated body mass index is 43.22 kg/m as calculated from the following:    Height as of this encounter: _1  (1.6 m).   Weight as of this encounter: 244 lb (110.7 kg). Ideal: Ideal body weight: 52.4 kg (115 lb 8.3 oz) Adjusted ideal body weight: 75.7 kg (166 lb 14.6 oz)  Assessment   Diagnosis Status  1. Chronic pain syndrome   2. Neurogenic pain   3. Lumbar radiculitis (L5 dermatome)    4. Chronic lower extremity pain (Fourth Area of Pain) (Bilateral) (R>L)   5. Chronic musculoskeletal pain    Controlled Controlled Controlled    Plan of Care  Problem-specific:  No problem-specific Assessment & Plan notes found for this encounter.  Ms. NALEE LIGHTLE has a current medication list which includes the following long-term medication(s): albuterol, amlodipine, atorvastatin, cetirizine, dicyclomine, diphenhydramine, duloxetine, fluticasone, furosemide, hydrochlorothiazide, losartan, montelukast, pantoprazole, potassium chloride sa, methocarbamol, and pregabalin.  Pharmacotherapy (Medications Ordered): Meds ordered this encounter  Medications   buprenorphine (BUTRANS) 7.5 MCG/HR    Sig: Place 1 patch onto the skin once a week.  Dispense:  4 patch    Refill:  2    Chronic Pain: STOP Act (Not applicable) Fill 1 day early if closed on refill date. Avoid benzodiazepines within 8 hours of opioids   methocarbamol (ROBAXIN) 750 MG tablet    Sig: Take 1 tablet (750 mg total) by mouth every 8 (eight) hours as needed for muscle spasms.    Dispense:  90 tablet    Refill:  11    Fill one day early if pharmacy is closed on scheduled refill date. May substitute for generic if available.   pregabalin (LYRICA) 75 MG capsule    Sig: Take 1 capsule (75 mg total) by mouth 3 (three) times daily.    Dispense:  90 capsule    Refill:  11    Fill one day early if pharmacy is closed on scheduled refill date. May substitute for generic if available.    Follow-up plan:   Return in about 12 weeks (around 09/30/2022) for Medication Management, in person.    Recent  Visits Date Type Provider Dept  07/07/22 Procedure visit Gillis Santa, MD [facility]  06/18/22 Office Visit Gillis Santa, MD [facility]  05/19/22 Procedure visit Gillis Santa, MD [facility]  04/30/22 Office Visit Gillis Santa, MD [facility]  Showing recent visits within past 90 days and meeting all other requirements Today's Visits Date Type Provider Dept  07/08/22 Office Visit Gillis Santa, MD [facility]  Showing today's visits and meeting all other requirements Future Appointments Date Type Provider Dept  08/18/22 Appointment Gillis Santa, MD [facility]  09/25/22 Appointment Gillis Santa, MD [facility]  Showing future appointments within next 90 days and meeting all other requirements  I discussed the assessment and treatment plan with the patient. The patient was provided an opportunity to ask questions and all were answered. The patient agreed with the plan and demonstrated an understanding of the instructions.  Patient advised to call back or seek an in-person evaluation if the symptoms or condition worsens.  Duration of encounter: 5mnutes.  Total time on encounter, as per AMA guidelines included both the face-to-face and non-face-to-face time personally spent by the physician and/or other qualified health care professional(s) on the day of the encounter (includes time in activities that require the physician or other qualified health care professional and does not include time in activities normally performed by clinical staff). Physician's time may include the following activities when performed: preparing to see the patient (eg, review of tests, pre-charting review of records) obtaining and/or reviewing separately obtained history performing a medically appropriate examination and/or evaluation counseling and educating the patient/family/caregiver ordering medications, tests, or  procedures referring and communicating with other health care professionals (when not separately reported) documenting clinical information in the electronic or other health record independently interpreting results (not separately reported) and communicating results to the patient/ family/caregiver care coordination (not separately reported)  Note by: BGillis Santa MD Date: 07/08/2022; Time: 11:08 AM

## 2022-07-08 NOTE — Telephone Encounter (Signed)
Post procedure phone call; pt states she is doing well.

## 2022-07-08 NOTE — Progress Notes (Signed)
Nursing Pain Medication Assessment:  Safety precautions to be maintained throughout the outpatient stay will include: orient to surroundings, keep bed in low position, maintain call bell within reach at all times, provide assistance with transfer out of bed and ambulation.  Nursing Pain Medication Assessment:  Safety precautions to be maintained throughout the outpatient stay will include: orient to surroundings, keep bed in low position, maintain call bell within reach at all times, provide assistance with transfer out of bed and ambulation.  Medication Inspection Compliance: Ms. Huneycutt did not comply with our request to bring her pills to be counted. She was reminded that bringing the medication bottles, even when empty, is a requirement.  Medication: None brought in. Pill/Patch Count: None available to be counted. Bottle Appearance: No container available. Did not bring bottle(s) to appointment. Filled Date: N/A Last Medication intake:   last applied 7 days ago

## 2022-07-14 ENCOUNTER — Telehealth: Payer: Self-pay | Admitting: Student in an Organized Health Care Education/Training Program

## 2022-07-14 NOTE — Telephone Encounter (Signed)
Called patient no answer, left message that Dr Holley Raring out of office until tomorrow and wasn't sure if he would give her 2 days . Will ask him on 07/15/22.

## 2022-07-14 NOTE — Telephone Encounter (Signed)
PT wants to see if she can have an doctor note for work. PT stated that she was out of work for two days after the procedure on 07-07-22. Please give patient a call. Thanks

## 2022-07-15 ENCOUNTER — Telehealth: Payer: Self-pay | Admitting: Student in an Organized Health Care Education/Training Program

## 2022-07-15 NOTE — Telephone Encounter (Signed)
Left message that patient can pick up note at the front desk for work.

## 2022-07-15 NOTE — Telephone Encounter (Signed)
PT stated that she was returning a call from a nurse. In reference to seeing about an doctor note for being out of work after procedure. Please give patient a call. Thanks

## 2022-07-21 ENCOUNTER — Encounter: Payer: Self-pay | Admitting: Family

## 2022-07-21 ENCOUNTER — Ambulatory Visit (INDEPENDENT_AMBULATORY_CARE_PROVIDER_SITE_OTHER): Payer: 59 | Admitting: Family

## 2022-07-21 ENCOUNTER — Ambulatory Visit (INDEPENDENT_AMBULATORY_CARE_PROVIDER_SITE_OTHER): Payer: 59

## 2022-07-21 VITALS — BP 130/86 | HR 68 | Temp 98.0°F | Ht 63.0 in | Wt 246.8 lb

## 2022-07-21 DIAGNOSIS — R1032 Left lower quadrant pain: Secondary | ICD-10-CM | POA: Diagnosis not present

## 2022-07-21 DIAGNOSIS — G629 Polyneuropathy, unspecified: Secondary | ICD-10-CM

## 2022-07-21 DIAGNOSIS — F32A Depression, unspecified: Secondary | ICD-10-CM

## 2022-07-21 DIAGNOSIS — M25552 Pain in left hip: Secondary | ICD-10-CM | POA: Diagnosis not present

## 2022-07-21 DIAGNOSIS — F419 Anxiety disorder, unspecified: Secondary | ICD-10-CM

## 2022-07-21 DIAGNOSIS — R69 Illness, unspecified: Secondary | ICD-10-CM | POA: Diagnosis not present

## 2022-07-21 HISTORY — DX: Pain in left hip: M25.552

## 2022-07-21 MED ORDER — DULOXETINE HCL 20 MG PO CPEP: 20.0000 mg | ORAL_CAPSULE | Freq: Two times a day (BID) | ORAL | 3 refills | Status: DC

## 2022-07-21 NOTE — Progress Notes (Signed)
Subjective:    Patient ID: Anita Gibbs, female    DOB: 1970/12/17, 51 y.o.   MRN: [id number]  CC: Anita Gibbs is a 51 y.o. female who presents today for follow up.   HPI: She continues to have pain left-sided lower abdomen, left buttocks.  Pain with sleeping on left side. Describes as cramping. Not a/w eating.   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   Worse when walking. No saddle anesthesia, urinary incontinence, fecal incontinence.   Started on Pepcid AC 20 qpm, compliant with protonix '40mg'$  Bentyl was started TID with some improvement.   She robaxin, pregabalin, cymbalta, buprenorphine . Follows with Anita Gibbs.    Works at [employer] .    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  Unable to see colonoscopy, endoscopy from [facility] ( records requested)   She has an appointment scheduled Anita Gibbs February 2024  Adrenal adenoma - she sees Anita Gibbs 01/2023   HISTORY:  Past Medical History:  Diagnosis Date   Arthritis    Carpal boss, right    Carpal tunnel syndrome, bilateral    Headache    Hives 09/05/2020   Hypertension    Seasonal asthma 11/29/2018   Past Surgical History:  Procedure Laterality Date   ABDOMINAL HYSTERECTOMY  10/17/2020   [facility]; she has both ovaries   ANTERIOR CERVICAL DECOMP/DISCECTOMY FUSION N/A 07/01/2017   Procedure: ANTERIOR CERVICAL DECOMPRESSION/DISCECTOMY FUSION 3 LEVELS;  Surgeon: Anita Maw, MD;  Location: [facility] ORS;  Service: Neurosurgery;  Laterality: N/A;   CARPAL TUNNEL RELEASE Bilateral    KNEE ARTHROSCOPY Right    Family History  Problem Relation Age of Onset   Anita Gibbs    Diabetes Gibbs    Arthritis Gibbs    Hypertension Gibbs    Hyperlipidemia Gibbs    Hypertension Father    Arthritis Father    Hearing loss Father    Kidney disease Father    Lymphoma Father        Anita Gibbs     Arthritis Sister    Hypertension Sister    Cancer Brother        ? lung due to exposure    COPD Brother    Early death Brother    COPD Son    Depression Son    Breast cancer Maternal Aunt    Breast cancer Paternal Aunt    Sleep apnea Daughter    Allergic rhinitis Neg Hx    Angioedema Neg Hx    Eczema Neg Hx    Urticaria Neg Hx    Asthma Neg Hx     Allergies: Other, Pineapple, Shellfish allergy, Strawberry (diagnostic), and Tomato Current Outpatient Medications on File Prior to Visit  Medication Sig Dispense Refill   albuterol (VENTOLIN HFA) 108 (90 Base) MCG/ACT inhaler Inhale 2 puffs into the lungs every 6 (six) hours as needed for wheezing or shortness of breath. 1 each 11   amLODipine (NORVASC) 5 MG tablet Take 1 tablet (5 mg total) by mouth daily. 90 tablet 3   atorvastatin (LIPITOR) 10 MG tablet MWF at night 90 tablet 3   buprenorphine (BUTRANS) 7.5 MCG/HR Place 1 patch onto the skin once a week. 4 patch 2   cetirizine (ZYRTEC ALLERGY) 10 MG tablet Take 1 tablet (10 mg total) by mouth at bedtime as needed. 90 tablet 3   Cholecalciferol (VITAMIN D-3) 125 MCG (  5000 UT) TABS Take by mouth daily.     Cyanocobalamin (B-12 PO) Take by mouth.     dicyclomine (BENTYL) 20 MG tablet Take 1 tablet (20 mg total) by mouth 4 (four) times daily -  before meals and at bedtime. 90 tablet 0   diphenhydrAMINE (BENADRYL) 25 mg capsule Take 25 mg by mouth every 6 (six) hours as needed (for allergies.).     Elastic Bandages & Supports (LUMBAR BACK BRACE/SUPPORT PAD) MISC 1 lumbar back brace for lumbar facet pain 1 each 0   EPINEPHrine (AUVI-Q) 0.3 mg/0.3 mL IJ SOAJ injection Use as directed for severe allergic reaction 2 Device 2   fluticasone (FLONASE) 50 MCG/ACT nasal spray Place 2 sprays into both nostrils daily. 16 g 6   furosemide (LASIX) 20 MG tablet Take 1 tablet (20 mg total) by mouth daily as needed. X 5-7 days 30 tablet 11   hydrochlorothiazide (HYDRODIURIL) 25 MG tablet TAKE 1/2 TABLET  EVERY MORNING 90 tablet 3   hydrocortisone 2.5 % cream Apply topically 2 (two) times daily. Prn to eyelids 30 g 0   losartan (COZAAR) 100 MG tablet Take 1 tablet (100 mg total) by mouth daily. 90 tablet 3   meloxicam (MOBIC) 15 MG tablet Take 1 tablet (15 mg total) by mouth every other day as needed. 45 tablet 11   methocarbamol (ROBAXIN) 750 MG tablet Take 1 tablet (750 mg total) by mouth every 8 (eight) hours as needed for muscle spasms. 90 tablet 11   montelukast (SINGULAIR) 10 MG tablet Take 1 tablet (10 mg total) by mouth at bedtime. 90 tablet 3   Olopatadine HCl 0.2 % SOLN Apply 1 drop to eye daily. Both eyes 2.5 mL 11   pantoprazole (PROTONIX) 40 MG tablet TAKE ONE TABLET EVERY DAY 30 MIN BEFORE DINNER 90 tablet 3   potassium chloride SA (KLOR-CON M) 20 MEQ tablet Take 1 tablet (20 mEq total) by mouth daily as needed. With lasix 30 tablet 11   pregabalin (LYRICA) 75 MG capsule Take 1 capsule (75 mg total) by mouth 3 (three) times daily. 90 capsule 11   No current facility-administered medications on file prior to visit.    Social History   Tobacco Use   Smoking status: Never   Smokeless tobacco: Never  Vaping Use   Vaping Use: Never used  Substance Use Topics   Alcohol use: Yes    Alcohol/week: 3.0 standard drinks of alcohol    Types: 3 Standard drinks or equivalent per week    Comment: occ   Drug use: No    Review of Systems  Constitutional:  Negative for chills and fever.  Respiratory:  Negative for cough.   Cardiovascular:  Negative for chest pain and palpitations.  Gastrointestinal:  Positive for abdominal pain. Negative for blood in stool, constipation, nausea and vomiting.  Genitourinary:  Negative for difficulty urinating.  Neurological:  Negative for numbness.      Objective:    BP 130/86 (BP Location: Left Arm, Patient Position: Sitting, Cuff Size: Normal)   Pulse 68   Temp 98 F (36.7 C) (Oral)   Ht '5\' 3"'$  (1.6 m)   Wt 246 lb 12.8 oz (111.9 kg)   LMP   (LMP Unknown)   SpO2 98%   BMI 43.72 kg/m  BP Readings from Last 3 Encounters:  07/21/22 130/86  07/08/22 (!) 142/82  07/07/22 129/83   Wt Readings from Last 3 Encounters:  07/21/22 246 lb 12.8 oz (111.9 kg)  07/08/22 244  lb (110.7 kg)  07/07/22 244 lb (110.7 kg)    Physical Exam Vitals reviewed.  Constitutional:      Appearance: Normal appearance. She is well-developed.  Eyes:     Conjunctiva/sclera: Conjunctivae normal.  Cardiovascular:     Rate and Rhythm: Normal rate and regular rhythm.     Pulses: Normal pulses.     Heart sounds: Normal heart sounds.  Pulmonary:     Effort: Pulmonary effort is normal.     Breath sounds: Normal breath sounds. No wheezing, rhonchi or rales.  Abdominal:     General: Bowel sounds are normal. There is no distension.     Palpations: Abdomen is soft. Abdomen is not rigid. There is no fluid wave or mass.     Tenderness: There is no abdominal tenderness. There is no right CVA tenderness, left CVA tenderness, guarding or rebound.       Comments: Left lower suprapubic discomfort with deep palpation.   Musculoskeletal:     Lumbar back: No swelling, edema, spasms, tenderness or bony tenderness. Normal range of motion.     Comments: Full range of motion with flexion, tension, lateral side bends. No bony tenderness. No pain, numbness, tingling elicited with single leg raise bilaterally.   Right Hip: No limp or waddling gait. Full ROM with flexion and hip rotation in flexion.    Mild pain of lateral hip with  (flexion-abduction-external rotation) test.   No pain with deep palpation of greater trochanter.     Skin:    General: Skin is warm and dry.  Neurological:     Mental Status: She is alert.     Sensory: No sensory deficit.     Deep Tendon Reflexes:     Reflex Scores:      Patellar reflexes are 2+ on the right side and 2+ on the left side.    Comments: Sensation and strength intact bilateral lower extremities.  Psychiatric:         Speech: Speech normal.        Behavior: Behavior normal.        Thought Content: Thought content normal.        Assessment & Plan:   Problem List Items Addressed This Visit       Nervous and Auditory   Neuropathy   Relevant Medications   DULoxetine (CYMBALTA) 20 MG capsule   Polyneuropathy   Relevant Medications   DULoxetine (CYMBALTA) 20 MG capsule     Other   Abdominal pain, left lower quadrant    Discussed GI versus MS etiology. Advised she may stop bentyl as doesn't appreciate much benefit.  Will follow      Anxiety and depression   Relevant Medications   DULoxetine (CYMBALTA) 20 MG capsule   Left hip pain - Primary    Presentation today raises concern for MS etiology versus GI.Pending XR pelvis, hip. She has known mild degenerative changes of the lumbar spine seen on MRI 01/2021, following with Anita Gibbs. Advised to keep upcoming appointment with Anita Anita Gibbs. Advised to trial tyelnol arthritis one tablet BID as scheduled. Increase cymbalta from '20mg'$  QD to 20 mg BID.  Close follow up.        Relevant Medications   DULoxetine (CYMBALTA) 20 MG capsule   Other Relevant Orders   DG HIPS BILAT W OR W/O PELVIS 3-4 VIEWS     I have changed Pachia C. Woollard's DULoxetine. I am also having her maintain her diphenhydrAMINE, EPINEPHrine, Vitamin D-3, fluticasone, hydrocortisone, Olopatadine HCl,  Lumbar Back Brace/Support Pad, Cyanocobalamin (B-12 PO), meloxicam, potassium chloride SA, pantoprazole, montelukast, losartan, hydrochlorothiazide, furosemide, cetirizine, atorvastatin, amLODipine, albuterol, dicyclomine, buprenorphine, methocarbamol, and pregabalin.   Meds ordered this encounter  Medications   DULoxetine (CYMBALTA) 20 MG capsule    Sig: Take 1 capsule (20 mg total) by mouth 2 (two) times daily.    Dispense:  180 capsule    Refill:  3    Order Specific Question:   Supervising Provider    Answer:   Crecencio Mc [[id number]]    Return precautions given.   Risks,  benefits, and alternatives of the medications and treatment plan prescribed today were discussed, and patient expressed understanding.   Education regarding symptom management and diagnosis given to patient on AVS.  Continue to follow with Carollee Leitz, MD for routine health maintenance.   [name] and I agreed with plan.   Mable Paris, FNP

## 2022-07-21 NOTE — Assessment & Plan Note (Signed)
Presentation today raises concern for MS etiology versus GI.Pending XR pelvis, hip. She has known mild degenerative changes of the lumbar spine seen on MRI 01/2021, following with Dr Holley Raring. Advised to keep upcoming appointment with Dr Alice Reichert. Advised to trial tyelnol arthritis one tablet BID as scheduled. Increase cymbalta from '20mg'$  QD to 20 mg BID.  Close follow up.

## 2022-07-21 NOTE — Patient Instructions (Addendum)
As discussed, let's start by scheduling Tylenol Arthritis which is a '650mg'$  tablet .   You may take 1-2 tablets every 8 hours ( scheduled) with maximum of 6 tablets per day.   For example , you could take two tablets in the morning ( 8am) and then two tablets again at 4pm.   Maximum daily dose of acetaminophen 4 g per day from all sources.  If you are taking another medication which includes acetaminophen (Tylenol) which may be in cough and cold preparations or pain medication such as Percocet, you will need to factor that into your total daily dose to be safe.  Please let me know if any questions   Increase Cymbalta from 20 mg daily to 20 mg twice daily.  Please let me know how you are doing

## 2022-07-21 NOTE — Assessment & Plan Note (Addendum)
Discussed GI versus MS etiology. Advised she may stop bentyl as doesn't appreciate much benefit.  Will follow

## 2022-07-26 ENCOUNTER — Other Ambulatory Visit: Payer: Self-pay | Admitting: Family

## 2022-07-26 DIAGNOSIS — R109 Unspecified abdominal pain: Secondary | ICD-10-CM

## 2022-08-05 ENCOUNTER — Ambulatory Visit (INDEPENDENT_AMBULATORY_CARE_PROVIDER_SITE_OTHER): Payer: 59 | Admitting: Family Medicine

## 2022-08-05 ENCOUNTER — Encounter: Payer: Self-pay | Admitting: Family Medicine

## 2022-08-05 VITALS — BP 128/80 | HR 74 | Temp 98.6°F | Ht 63.0 in | Wt 246.0 lb

## 2022-08-05 DIAGNOSIS — R1032 Left lower quadrant pain: Secondary | ICD-10-CM | POA: Diagnosis not present

## 2022-08-05 DIAGNOSIS — R600 Localized edema: Secondary | ICD-10-CM | POA: Diagnosis not present

## 2022-08-05 DIAGNOSIS — G894 Chronic pain syndrome: Secondary | ICD-10-CM

## 2022-08-05 DIAGNOSIS — I10 Essential (primary) hypertension: Secondary | ICD-10-CM | POA: Diagnosis not present

## 2022-08-05 MED ORDER — POLYETHYLENE GLYCOL 3350 17 GM/SCOOP PO POWD: 17.0000 g | Freq: Two times a day (BID) | ORAL | 1 refills | Status: AC | PRN

## 2022-08-05 MED ORDER — HYDROCHLOROTHIAZIDE 25 MG PO TABS: 25.0000 mg | ORAL_TABLET | Freq: Every day | ORAL | Status: DC

## 2022-08-05 NOTE — Patient Instructions (Addendum)
It was a pleasure meeting you today. Thank you for allowing me to take part in your health care.  Our goals for today as we discussed include:  For your blood pressure Continue losartan 100 mg at night Increase hydrochlorothiazide to 25 mg during the day Stop amlodipine Monitor blood pressure at home.  Notify MD if continues to elevate greater than 140/90  For your swelling of your legs Use compression stockings daily Elevate legs is much as possible Stop amlodipine Have increased your hydrochlorothiazide Can continue to use Lasix 20 mg as needed but use sparingly.  For your abdominal bloating Start MiraLAX 1 scoop twice a day.  When symptoms start to relieve decrease to once daily then every other day. Recommend increasing fruits and vegetables and limiting fried foods and carbohydrates. Start probiotics daily If you develop any fevers, worsening abdominal pain, bloody in stool please go to the emergency department    If you have any questions or concerns, please do not hesitate to call the office at [phone].  I look forward to our next visit and until then take care and stay safe.  Regards,   Carollee Leitz, MD   [facility]

## 2022-08-05 NOTE — Progress Notes (Signed)
SUBJECTIVE:   Chief Complaint  Patient presents with   Transitions Of Care    Bloating/cramping   HPI Patient presents to clinic to transfer care.  Complaints of bloating and cramping Symptoms started a couple of months ago.  Mostly on left side.  Denies any fevers, diarrhea, constipation, bloody stool, nausea or vomiting.  Last BM today, reports usually 2 times a day.  Pain lasts until she has a bowel movement.  Nutrition consists mostly of [employer], cafeteria food, skips lunch and dinner consists of chips and fruit.  Reports last colonoscopy 5 years ago had polyps removed.  Currently on opioids for back pain.  Not on any bowel regime.  Hypertension Asymptomatic.  Amlodipine recently added.  Patient reports no change in blood pressure.  Also taking losartan 100 mg daily and hydrochlorothiazide 12.5 mg daily  Chronic pain Follows with Dr. Holley Raring.  Currently on buprenorphine patch 7.5 mcg an hour, duloxetine 20 mg twice daily, meloxicam 15 mg every other day, methocarbamol 750 mg every 8 hours as needed and pregabalin 75 mg 3 times daily.   PERTINENT PMH / PSH: HTN Chronic pain syndrome Left lower quadrant pain   OBJECTIVE:  BP 128/80   Pulse 74   Temp 98.6 F (37 C) (Oral)   Ht '5\' 3"'$  (1.6 m)   Wt 246 lb (111.6 kg)   LMP  (LMP Unknown)   SpO2 97%   BMI 43.58 kg/m    Physical Exam Vitals reviewed.  Constitutional:      General: She is not in acute distress.    Appearance: She is obese. She is not ill-appearing, toxic-appearing or diaphoretic.  HENT:     Head: Normocephalic.     Nose: Nose normal.  Eyes:     Conjunctiva/sclera: Conjunctivae normal.  Cardiovascular:     Rate and Rhythm: Normal rate and regular rhythm.     Heart sounds: Normal heart sounds.  Pulmonary:     Effort: Pulmonary effort is normal.     Breath sounds: Normal breath sounds.  Abdominal:     General: Bowel sounds are normal.     Palpations: Abdomen is soft.  Musculoskeletal:         General: Normal range of motion.     Cervical back: Normal range of motion.  Neurological:     Mental Status: She is alert and oriented to person, place, and time. Mental status is at baseline.  Psychiatric:        Mood and Affect: Mood normal.        Behavior: Behavior normal.        Thought Content: Thought content normal.        Judgment: Judgment normal.     ASSESSMENT/PLAN:  Essential hypertension Assessment & Plan: Mildly elevated today.  Asymptomatic. Discontinue amlodipine secondary to lower extremity edema Increase hydrochlorothiazide to 25 mg Continue losartan 100 mg daily Continue to monitor blood pressure Strict return precautions provided  Orders: -     hydroCHLOROthiazide; Take 1 tablet (25 mg total) by mouth daily.  Chronic pain syndrome Assessment & Plan: Follows with Dr. Holley Raring for management.     Abdominal pain, left lower quadrant Assessment & Plan: Benign abdominal exam.  Suspect constipation given chronic opioid use. Start MiraLAX 1 scoop daily Increase soluble fiber Increase water intake Increase exercise Recommend healthier diet Strict return precautions provided  Orders: -     Polyethylene Glycol 3350; Take 17 g by mouth 2 (two) times daily as needed.  Dispense: 3350  g; Refill: 1  Bilateral lower extremity edema Assessment & Plan: Use compression stockings daily Elevate legs is much as possible Stop amlodipine Have increased your hydrochlorothiazide Can continue to use Lasix 20 mg as needed but use sparingly.  Plan to discontinue in future    PDMP reviewed  Return in about 3 months (around 11/04/2022) for PCP.  Carollee Leitz, MD

## 2022-08-17 ENCOUNTER — Encounter: Payer: Self-pay | Admitting: Family Medicine

## 2022-08-17 NOTE — Assessment & Plan Note (Signed)
Follows with Dr. Holley Raring for management.

## 2022-08-17 NOTE — Assessment & Plan Note (Signed)
Use compression stockings daily Elevate legs is much as possible Stop amlodipine Have increased your hydrochlorothiazide Can continue to use Lasix 20 mg as needed but use sparingly.  Plan to discontinue in future

## 2022-08-17 NOTE — Assessment & Plan Note (Signed)
Benign abdominal exam.  Suspect constipation given chronic opioid use. Start MiraLAX 1 scoop daily Increase soluble fiber Increase water intake Increase exercise Recommend healthier diet Strict return precautions provided

## 2022-08-17 NOTE — Assessment & Plan Note (Signed)
Mildly elevated today.  Asymptomatic. Discontinue amlodipine secondary to lower extremity edema Increase hydrochlorothiazide to 25 mg Continue losartan 100 mg daily Continue to monitor blood pressure Strict return precautions provided

## 2022-08-18 ENCOUNTER — Encounter: Payer: Self-pay | Admitting: Student in an Organized Health Care Education/Training Program

## 2022-08-18 ENCOUNTER — Ambulatory Visit
Payer: 59 | Attending: Student in an Organized Health Care Education/Training Program | Admitting: Student in an Organized Health Care Education/Training Program

## 2022-08-18 DIAGNOSIS — M47816 Spondylosis without myelopathy or radiculopathy, lumbar region: Secondary | ICD-10-CM

## 2022-08-18 DIAGNOSIS — M5442 Lumbago with sciatica, left side: Secondary | ICD-10-CM | POA: Diagnosis not present

## 2022-08-18 DIAGNOSIS — G8929 Other chronic pain: Secondary | ICD-10-CM | POA: Diagnosis not present

## 2022-08-18 DIAGNOSIS — M5441 Lumbago with sciatica, right side: Secondary | ICD-10-CM | POA: Diagnosis not present

## 2022-08-18 DIAGNOSIS — G894 Chronic pain syndrome: Secondary | ICD-10-CM | POA: Diagnosis not present

## 2022-08-18 NOTE — Progress Notes (Signed)
Patient: Anita Gibbs  Service Category: E/M  Provider: Gillis Santa, MD  DOB: 1971/01/15  DOS: 08/18/2022  Location: Office  MRN: [id number]  Setting: Ambulatory outpatient  Referring Provider: Carollee Leitz, MD  Type: Established Patient  Specialty: Interventional Pain Management  PCP: Carollee Leitz, MD  Location: Home  Delivery: TeleHealth     Virtual Encounter - Pain Management PROVIDER NOTE: Information contained herein reflects review and annotations entered in association with encounter. Interpretation of such information and data should be left to medically-trained personnel. Information provided to patient can be located elsewhere in the medical record under "Patient Instructions". Document created using STT-dictation technology, any transcriptional errors that may result from process are unintentional.    Contact & Pharmacy Preferred: [phone] Home: [phone] (home) Mobile: [phone] (mobile) E-mail: [id number] [address] [place] [address] Phone: [phone] Fax: [place] [address] Phone: [phone] Fax: [phone]  [place], [place] [address] [address] Phone: [phone] Fax: [phone]  [id number]: [phone]: [phone]  Pre-screening  Anita Gibbs offered "in-person" vs "virtual" encounter. She indicated preferring virtual for this encounter.   Reason COVID-19*  Social distancing based on CDC and AMA recommendations.   I contacted Anita Amatoon 08/18/2022 via telephone.      I clearly identified myself as BGillis Santa MD. I verified that I was speaking with the correct person  using two identifiers (Name: Anita Gibbs and date of birth: 128-Apr-1972.  Consent I sought verbal advanced consent from Anita Amatofor virtual visit interactions. I informed Anita Gibbs of possible security and privacy concerns, risks, and limitations associated with providing "not-in-person" medical evaluation and management services. I also informed Anita Gibbs of the availability of "in-person" appointments. Finally, I informed her that there would be a charge for the virtual visit and that she could be  personally, fully or partially, financially responsible for it. Ms. FFootmanexpressed understanding and agreed to proceed.   Historic Elements   Ms. WROXY MASTANDREAis a 51y.o. year old, female patient evaluated today after our last contact on 07/15/2022. 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), Adrenal nodule ([place] (06/20/2020), ANA positive (02/01/2021), Anxiety and depression (01/29/2021), Arthritis, Back pain with history of spinal surgery (01/29/2021), Bilateral carpal tunnel syndrome (01/29/2021), Bilateral leg edema (01/29/2021), BMI 40.0-44.9, adult ([place] (04/30/2022), Carpal boss, right, Carpal tunnel syndrome, bilateral, 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) (09/27/2018), Cervical facet hypertrophy (C3-T1) (09/27/2018), Cervical facet joint syndrome (Bilateral) (L>R) (09/27/2018), 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-5, C5-6, and C6-7) (09/27/2018), Cervical myelopathy ([place] (07/01/2017), Cervicalgia (Primary Area of Pain) (Bilateral) (L>R) (09/27/2018), Chronic gout of foot (Left) (05/31/2018), Chronic low back pain (Tertiary Area of Pain) (Bilateral) (R>L) w/ sciatica (Bilateral) (09/07/2018), Chronic low back pain (Tertiary  Area of Pain) (Bilateral) (R>L) w/o sciatica (07/26/2018), Chronic lower extremity pain (Fourth Area of Pain) (Bilateral) (R>L) (09/07/2018), Chronic musculoskeletal pain (10/27/2018), Chronic neck pain (Bilateral) w/ history of cervical spinal surgery (09/27/2018), Chronic neck  pain (Primary Area of Pain) (Bilateral) (L>R) (09/07/2018), Chronic sacroiliac joint dysfunction (Bilateral) (09/27/2018), Chronic sacroiliac joint pain (Right) (09/07/2018), Chronic upper extremity pain (Secondary Area of Pain) (Bilateral) (R>L) (09/27/2018), Disorder of skeletal system (09/07/2018), E. coli UTI (urinary tract infection) (06/20/2020), Elevated sed rate (09/08/2018), Elevated serum creatinine (06/20/2020), Excessive daytime sleepiness (03/06/2020), Female pelvic pain (09/05/2020), Headache, History of allergy to shellfish (09/28/2018), History of fusion of cervical spine (ACDF C4-C7) (33/38/3291), History of illicit drug use (91/66/0600), Hives (09/05/2020), Hives (09/05/2020), Hyperkalemia (05/31/2018), Hyperlipidemia (04/30/2022), Hypertension, Large breasts (05/30/2020), Left hip pain (07/21/2022), Lumbar facet arthropathy (04/20/2019), Lumbar radiculitis (L5 dermatome) (Right) (04/17/2020), Multiple environmental allergies (01/17/2020), Multiple food allergies (03/07/2020), Neurogenic pain (10/27/2018), Numbness and tingling of both feet (05/31/2018), 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), Other intervertebral disc degeneration, 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), Pharmacologic therapy (09/07/2018), Plantar fasciitis, bilateral (03/04/2021), Polyneuropathy (01/29/2021), Prediabetes (10/10/2019), Problems influencing health status (09/07/2018), Rash (08/08/2019), Seasonal allergic rhinitis due to pollen (11/29/2018), Seasonal allergies (03/06/2020), Seasonal asthma (11/29/2018), Somatic dysfunction of sacroiliac joint (Bilateral) (09/27/2018), Spondylosis without myelopathy or radiculopathy, cervical region  (03/22/2019), Spondylosis without myelopathy or radiculopathy, lumbosacral region (05/19/2019), Spondylosis, cervical, 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 She also  has a past surgical history that includes Knee arthroscopy (Right); Anterior cervical decomp/discectomy fusion (N/A, 07/01/2017); Abdominal hysterectomy (10/17/2020); and Carpal tunnel release (Bilateral). Anita Gibbs has a current medication list which includes the following prescription(s): albuterol, atorvastatin, buprenorphine, cetirizine, vitamin d-3, cyanocobalamin, diphenhydramine, duloxetine, epinephrine, fluticasone, furosemide, hydrochlorothiazide, hydrocortisone, losartan, meloxicam, methocarbamol, montelukast, olopatadine hcl, pantoprazole, polyethylene glycol powder, potassium chloride sa, and pregabalin. She  reports that she has never smoked. She has never used smokeless tobacco. She reports current alcohol use of about 3.0 standard drinks of alcohol per week. She reports that she does not use drugs. Anita Gibbs is allergic to other, pineapple, shellfish allergy, strawberry (diagnostic), and tomato.  Estimated body mass index is 43.58 kg/m as calculated from the following:   Height as of 08/05/22: _0  (1.6 m).   Weight as of 08/05/22: 246 lb (111.6 kg).  HPI  Today, she is being contacted for a post-procedure assessment.   Post-procedure evaluation   Type: Lumbar Facet, Medial Branch Block(s) #2  Laterality: Bilateral  Level: L3, L4, L5,  Medial Branch Level(s). Injecting these levels blocks the L3-4 and L4-5 lumbar facet joints. Imaging: Fluoroscopic guidance         Anesthesia: Local anesthesia (1-2% Lidocaine) Anxiolysis: IV Versed         Sedation:    minimal          DOS: 07/07/2022 Performed by: Gillis Santa, MD  Primary Purpose: Diagnostic/Therapeutic Indications: Low back pain severe enough to impact quality of life or function. 1. Lumbar facet arthropathy   2. Spondylosis without  myelopathy or radiculopathy, cervical region   3. Lumbar facet syndrome (Bilateral) (R>L)    NAS-11 Pain score:   Pre-procedure: 9 /10   Post-procedure: 2 /10      Effectiveness:  Initial hour after procedure: 100 %  Subsequent 4-6 hours post-procedure: 100 %  Analgesia past initial 6 hours:60- 50 % (lasting 3-4 weeks)  Ongoing improvement:  Analgesic:  40-45% Function: Back to baseline ROM: Back to baseline   Laboratory Chemistry Profile   Renal Lab Results  Component Value Date   BUN 18 06/30/2022   CREATININE 0.90 45/99/7741   BCR NOT APPLICABLE 42/39/5320   GFR 85.51 06/30/2022   GFRAA 76 09/07/2018   GFRNONAA 66 09/07/2018    Hepatic Lab  Results  Component Value Date   AST 13 06/30/2022   ALT 13 06/30/2022   ALBUMIN 4.1 06/30/2022   ALKPHOS 65 06/30/2022   LIPASE 32.0 06/30/2022    Electrolytes Lab Results  Component Value Date   NA 137 06/30/2022   K 3.7 06/30/2022   CL 101 06/30/2022   CALCIUM 9.5 06/30/2022   MG 2.2 09/07/2018    Bone Lab Results  Component Value Date   VD25OH 54.97 01/24/2021   25OHVITD1 8.1 (L) 09/07/2018   25OHVITD2 <1.0 09/07/2018   25OHVITD3 8.1 09/07/2018    Inflammation (CRP: Acute Phase) (ESR: Chronic Phase) Lab Results  Component Value Date   CRP <1.0 01/24/2021   ESRSEDRATE 37 (H) 01/24/2021         Note: Above Lab results reviewed.  Imaging  DG HIPS BILAT W OR W/O PELVIS 3-4 VIEWS CLINICAL DATA:  Left hip pain.  EXAM: DG HIP (WITH OR WITHOUT PELVIS) 3-4V BILAT  COMPARISON:  None Available.  FINDINGS: There is no evidence of hip fracture or dislocation. Mild osteophyte formation is seen involving the left hip. Right hip is unremarkable.  IMPRESSION: Mild degenerative joint disease of the left hip. No acute abnormality seen.  Electronically Signed   By: Marijo Conception M.D.   On: 07/22/2022 15:48  Assessment  The primary encounter diagnosis was Lumbar facet arthropathy. Diagnoses of Lumbar facet  syndrome (Bilateral) (R>L), Chronic low back pain (Tertiary Area of Pain) (Bilateral) (R>L) w/ sciatica (Bilateral), and Chronic pain syndrome were also pertinent to this visit.  Plan of Care  Positive diagnostic response to 2 sets of bilateral L3, L4, L5 medial branch nerve block for lumbar facet arthropathy.  Discussed next treatment steps to include lumbar radiofrequency ablation of the medial branch nerves.  Risk and benefits reviewed and patient would like to proceed.  Orders:  Orders Placed This Encounter  Procedures   Radiofrequency,Lumbar    Standing Status:   Future    Standing Expiration Date:   11/17/2022    Scheduling Instructions:     Side(s): LEFT     Level(s): L3, L4, L5,  Medial Branch Nerve(s)     Sedation: With Sedation     Scheduling Timeframe: As soon as pre-approved    Order Specific Question:   Where will this procedure be performed?    Answer:   [facility]   Radiofrequency,Lumbar    Standing Status:   Future    Standing Expiration Date:   11/17/2022    Scheduling Instructions:     Side(s): Right     Level(s): L3, L4, L5,  Medial Branch Nerve(s)     Sedation: With Sedation     Scheduling Timeframe: 2 weeks after left    Order Specific Question:   Where will this procedure be performed?    Answer:   [facility]   Follow-up plan:   Return in about 3 weeks (around 09/08/2022) for Left L3, 4, 5 RFA, in clinic IV Versed.    Recent Visits Date Type Provider Dept  07/08/22 Office Visit Gillis Santa, MD [facility]  07/07/22 Procedure visit Gillis Santa, MD [facility]  06/18/22 Office Visit Gillis Santa, MD [facility]  Showing recent visits within past 90 days and meeting all other requirements Today's Visits Date Type Provider Dept  08/18/22 Office Visit Gillis Santa, MD [facility]  Showing today's visits and meeting all other requirements Future Appointments Date Type Provider Dept  09/25/22  Appointment  Gillis Santa, MD [facility]  Showing future appointments within next 90 days and meeting all other requirements  I discussed the assessment and treatment plan with the patient. The patient was provided an opportunity to ask questions and all were answered. The patient agreed with the plan and demonstrated an understanding of the instructions.  Patient advised to call back or seek an in-person evaluation if the symptoms or condition worsens.  Duration of encounter: 72mnutes.  Note by: BGillis Santa MD Date: 08/18/2022; Time: 3:27 PM

## 2022-08-19 ENCOUNTER — Telehealth: Payer: Self-pay

## 2022-09-03 ENCOUNTER — Telehealth: Payer: 59 | Admitting: Family Medicine

## 2022-09-03 ENCOUNTER — Telehealth: Payer: Self-pay

## 2022-09-03 NOTE — Telephone Encounter (Signed)
This was suppose to be a 8:00 virtual visit so I logged into virtual visit and sent the Patient a direct link. She was not on so I called and asked her to get on. When she got on she was driving with other people in the car being loud and playing music then it sound like they were in the drive thru. The Patient then states "I need to call you back because I have something to do right now." I stated when need to start the visit and the Patient got mad and stated "well forget I can not do it right now" then hung up.

## 2022-09-25 ENCOUNTER — Encounter: Payer: 59 | Admitting: Student in an Organized Health Care Education/Training Program

## 2022-10-06 ENCOUNTER — Encounter: Payer: Self-pay | Admitting: Student in an Organized Health Care Education/Training Program

## 2022-10-06 ENCOUNTER — Ambulatory Visit
Admission: RE | Admit: 2022-10-06 | Discharge: 2022-10-06 | Disposition: A | Discharge status: Home / Self Care | Payer: 59 | Source: Ambulatory Visit | Attending: Student in an Organized Health Care Education/Training Program | Admitting: Student in an Organized Health Care Education/Training Program

## 2022-10-06 ENCOUNTER — Ambulatory Visit
Payer: 59 | Attending: Student in an Organized Health Care Education/Training Program | Admitting: Student in an Organized Health Care Education/Training Program

## 2022-10-06 DIAGNOSIS — G894 Chronic pain syndrome: Secondary | ICD-10-CM | POA: Insufficient documentation

## 2022-10-06 DIAGNOSIS — M47816 Spondylosis without myelopathy or radiculopathy, lumbar region: Secondary | ICD-10-CM | POA: Diagnosis not present

## 2022-10-06 DIAGNOSIS — M5441 Lumbago with sciatica, right side: Secondary | ICD-10-CM | POA: Diagnosis not present

## 2022-10-06 DIAGNOSIS — M5442 Lumbago with sciatica, left side: Secondary | ICD-10-CM | POA: Diagnosis not present

## 2022-10-06 DIAGNOSIS — G8929 Other chronic pain: Secondary | ICD-10-CM | POA: Insufficient documentation

## 2022-10-06 MED ORDER — MIDAZOLAM HCL 2 MG/2ML IJ SOLN
0.5000 mg | Freq: Once | INTRAMUSCULAR | Status: AC
  Administered 2022-10-06: 1.5 mg via INTRAVENOUS

## 2022-10-06 MED ORDER — DEXAMETHASONE SODIUM PHOSPHATE 10 MG/ML IJ SOLN
INTRAMUSCULAR | Status: AC
  Filled 2022-10-06: qty 1

## 2022-10-06 MED ORDER — LACTATED RINGERS IV SOLN: Freq: Once | INTRAVENOUS | Status: AC

## 2022-10-06 MED ORDER — DEXAMETHASONE SODIUM PHOSPHATE 10 MG/ML IJ SOLN
10.0000 mg | Freq: Once | INTRAMUSCULAR | Status: AC
  Administered 2022-10-06: 10 mg

## 2022-10-06 MED ORDER — LIDOCAINE HCL (PF) 2 % IJ SOLN
INTRAMUSCULAR | Status: AC
  Filled 2022-10-06: qty 5

## 2022-10-06 MED ORDER — LIDOCAINE HCL 2 % IJ SOLN
20.0000 mL | Freq: Once | INTRAMUSCULAR | Status: AC
  Administered 2022-10-06: 400 mg

## 2022-10-06 MED ORDER — MIDAZOLAM HCL 2 MG/2ML IJ SOLN
INTRAMUSCULAR | Status: AC
  Filled 2022-10-06: qty 2

## 2022-10-06 MED ORDER — ROPIVACAINE HCL 2 MG/ML IJ SOLN
9.0000 mL | Freq: Once | INTRAMUSCULAR | Status: AC
  Administered 2022-10-06: 9 mL via PERINEURAL

## 2022-10-06 MED ORDER — ROPIVACAINE HCL 2 MG/ML IJ SOLN
INTRAMUSCULAR | Status: AC
  Filled 2022-10-06: qty 20

## 2022-10-06 NOTE — Progress Notes (Signed)
PROVIDER NOTE: Interpretation of information contained herein should be left to medically-trained personnel. Specific patient instructions are provided elsewhere under "Patient Instructions" section of medical record. This document was created in part using STT-dictation technology, any transcriptional errors that may result from this process are unintentional.  Patient: Anita Gibbs Type: Established DOB: 06-15-1971 MRN: [id number] PCP: Carollee Leitz, MD  Service: Procedure DOS: 10/06/2022 Setting: Ambulatory Location: Ambulatory outpatient facility Delivery: Face-to-face Provider: Gillis Santa, MD Specialty: Interventional Pain Management Specialty designation: 09 Location: Outpatient facility Ref. Prov.: Gillis Santa, MD       Interventional Therapy     Procedure: Lumbar Facet, Medial Branch Radiofrequency Ablation (RFA) #1  Laterality: Left (-LT)  Level: L3, L4, and L5 Medial Branch Level(s). These levels will denervate the L3-4 and L4-5 lumbar facet joints.  Imaging: Fluoroscopy-guided         Anesthesia: Local anesthesia (1-2% Lidocaine) Anxiolysis: IV Versed         Sedation: Minimal Sedation                       DOS: 10/06/2022  Performed by: Gillis Santa, MD  Purpose: Therapeutic/Palliative Indications: Low back pain severe enough to impact quality of life or function. Indications: 1. Lumbar facet arthropathy   2. Lumbar facet syndrome (Bilateral) (R>L)   3. Chronic low back pain 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 Chronic pain syndrome    Anita Gibbs has been dealing with the above chronic pain for longer than three months and has either failed to respond, was unable to tolerate, or simply did not get enough benefit from other more conservative therapies including, but not limited to: 1. Over-the-counter medications 2. Anti-inflammatory medications 3. Muscle relaxants 4. Membrane stabilizers 5. Opioids 6. Physical therapy and/or  chiropractic manipulation 7. Modalities (Heat, ice, etc.) 8. Invasive techniques such as nerve blocks. Anita Gibbs has attained more than 50% relief of the pain from a series of diagnostic injections conducted in separate occasions.  Pain Score: Pre-procedure: 10-Worst pain ever/10 Post-procedure: 0-No pain/10     Position / Prep / Materials:  Position: Prone  Prep solution: DuraPrep (Iodine Povacrylex [0.7% available iodine] and Isopropyl Alcohol, 74% w/w) Prep Area: Entire Lumbosacral Region (Lower back from mid-thoracic region to end of tailbone and from flank to flank.) Materials:  Tray: RFA (Radiofrequency) tray Needle(s):  Type: RFA (Teflon-coated radiofrequency ablation needles)   Pre-op H&P Assessment:  Anita Gibbs is a 52 y.o. (year old), female patient, seen today for interventional treatment. She  has a past surgical history that includes Knee arthroscopy (Right); Anterior cervical decomp/discectomy fusion (N/A, 07/01/2017); Abdominal hysterectomy (10/17/2020); and Carpal tunnel release (Bilateral). Anita Gibbs has a current medication list which includes the following prescription(s): albuterol, atorvastatin, cetirizine, vitamin d-3, cyanocobalamin, diphenhydramine, duloxetine, epinephrine, fluticasone, furosemide, hydrochlorothiazide, hydrocortisone, losartan, meloxicam, methocarbamol, montelukast, olopatadine hcl, pantoprazole, polyethylene glycol powder, potassium chloride sa, and pregabalin, and the following Facility-Administered Medications: lactated ringers. Her primarily concern today is the Back Pain (Lower left worse)  Initial Vital Signs:  Pulse/HCG Rate: 62  Temp: (!) 97.5 F (36.4 C) Resp: 18 BP: 128/76 SpO2: 100 %  BMI: Estimated body mass index is 43.05 kg/m as calculated from the following:   Height as of this encounter: '5\' 3"'$  (1.6 m).   Weight as of this encounter: 243 lb (110.2 kg).  Risk Assessment: Allergies: Reviewed. She is allergic to other,  pineapple, shellfish allergy, strawberry (diagnostic), and tomato.  Allergy  Precautions: None required Coagulopathies: Reviewed. None identified.  Blood-thinner therapy: None at this time Active Infection(s): Reviewed. None identified. Anita Gibbs is afebrile  Site Confirmation: Anita Gibbs was asked to confirm the procedure and laterality before marking the site Procedure checklist: Completed Consent: Before the procedure and under the influence of no sedative(s), amnesic(s), or anxiolytics, the patient was informed of the treatment options, risks and possible complications. To fulfill our ethical and legal obligations, as recommended by the American Medical Association's Code of Ethics, I have informed the patient of my clinical impression; the nature and purpose of the treatment or procedure; the risks, benefits, and possible complications of the intervention; the alternatives, including doing nothing; the risk(s) and benefit(s) of the alternative treatment(s) or procedure(s); and the risk(s) and benefit(s) of doing nothing. The patient was provided information about the general risks and possible complications associated with the procedure. These may include, but are not limited to: failure to achieve desired goals, infection, bleeding, organ or nerve damage, allergic reactions, paralysis, and death. In addition, the patient was informed of those risks and complications associated to Spine-related procedures, such as failure to decrease pain; infection (i.e.: Meningitis, epidural or intraspinal abscess); bleeding (i.e.: epidural hematoma, subarachnoid hemorrhage, or any other type of intraspinal or peri-dural bleeding); organ or nerve damage (i.e.: Any type of peripheral nerve, nerve root, or spinal cord injury) with subsequent damage to sensory, motor, and/or autonomic systems, resulting in permanent pain, numbness, and/or weakness of one or several areas of the body; allergic reactions; (i.e.:  anaphylactic reaction); and/or death. Furthermore, the patient was informed of those risks and complications associated with the medications. These include, but are not limited to: allergic reactions (i.e.: anaphylactic or anaphylactoid reaction(s)); adrenal axis suppression; blood sugar elevation that in diabetics may result in ketoacidosis or comma; water retention that in patients with history of congestive heart failure may result in shortness of breath, pulmonary edema, and decompensation with resultant heart failure; weight gain; swelling or edema; medication-induced neural toxicity; particulate matter embolism and blood vessel occlusion with resultant organ, and/or nervous system infarction; and/or aseptic necrosis of one or more joints. Finally, the patient was informed that Medicine is not an exact science; therefore, there is also the possibility of unforeseen or unpredictable risks and/or possible complications that may result in a catastrophic outcome. The patient indicated having understood very clearly. We have given the patient no guarantees and we have made no promises. Enough time was given to the patient to ask questions, all of which were answered to the patient's satisfaction. Ms. Bromell has indicated that she wanted to continue with the procedure. Attestation: I, the ordering provider, attest that I have discussed with the patient the benefits, risks, side-effects, alternatives, likelihood of achieving goals, and potential problems during recovery for the procedure that I have provided informed consent. Date  Time: 10/06/2022  9:51 AM   Pre-Procedure Preparation:  Monitoring: As per clinic protocol. Respiration, ETCO2, SpO2, BP, heart rate and rhythm monitor placed and checked for adequate function Safety Precautions: Patient was assessed for positional comfort and pressure points before starting the procedure. Time-out: I initiated and conducted the "Time-out" before starting the  procedure, as per protocol. The patient was asked to participate by confirming the accuracy of the "Time Out" information. Verification of the correct person, site, and procedure were performed and confirmed by me, the nursing staff, and the patient. "Time-out" conducted as per Joint Commission's Universal Protocol (UP.01.01.01). Time: 1016  Description of Procedure:  Laterality: See above. Levels:  See above. Safety Precautions: Aspiration looking for blood return was conducted prior to all injections. At no point did we inject any substances, as a needle was being advanced. Before injecting, the patient was told to immediately notify me if she was experiencing any new onset of "ringing in the ears, or metallic taste in the mouth". No attempts were made at seeking any paresthesias. Safe injection practices and needle disposal techniques used. Medications properly checked for expiration dates. SDV (single dose vial) medications used. After the completion of the procedure, all disposable equipment used was discarded in the proper designated medical waste containers. Local Anesthesia: Protocol guidelines were followed. The patient was positioned over the fluoroscopy table. The area was prepped in the usual manner. The time-out was completed. The target area was identified using fluoroscopy. A 12-in long, straight, sterile hemostat was used with fluoroscopic guidance to locate the targets for each level blocked. Once located, the skin was marked with an approved surgical skin marker. Once all sites were marked, the skin (epidermis, dermis, and hypodermis), as well as deeper tissues (fat, connective tissue and muscle) were infiltrated with a small amount of a short-acting local anesthetic, loaded on a 10cc syringe with a 25G, 1.5-in  Needle. An appropriate amount of time was allowed for local anesthetics to take effect before proceeding to the next step. Technical description of process:  Radiofrequency  Ablation (RFA) L3 Medial Branch Nerve RFA: The target area for the L3 medial branch is at the junction of the postero-lateral aspect of the superior articular process and the superior, posterior, and medial edge of the transverse process of L4. Under fluoroscopic guidance, a Radiofrequency needle was inserted until contact was made with os over the superior postero-lateral aspect of the pedicular shadow (target area). Sensory and motor testing was conducted to properly adjust the position of the needle. Once satisfactory placement of the needle was achieved, the numbing solution was slowly injected after negative aspiration for blood. 2.0 mL of the nerve block solution was injected without difficulty or complication. After waiting for at least 3 minutes, the ablation was performed. Once completed, the needle was removed intact. L4 Medial Branch Nerve RFA: The target area for the L4 medial branch is at the junction of the postero-lateral aspect of the superior articular process and the superior, posterior, and medial edge of the transverse process of L5. Under fluoroscopic guidance, a Radiofrequency needle was inserted until contact was made with os over the superior postero-lateral aspect of the pedicular shadow (target area). Sensory and motor testing was conducted to properly adjust the position of the needle. Once satisfactory placement of the needle was achieved, the numbing solution was slowly injected after negative aspiration for blood. 2.0 mL of the nerve block solution was injected without difficulty or complication. After waiting for at least 3 minutes, the ablation was performed. Once completed, the needle was removed intact. L5 Medial Branch Nerve RFA: The target area for the L5 medial branch is at the junction of the postero-lateral aspect of the superior articular process of S1 and the superior, posterior, and medial edge of the sacral ala. Under fluoroscopic guidance, a Radiofrequency needle was  inserted until contact was made with os over the superior postero-lateral aspect of the pedicular shadow (target area). Sensory and motor testing was conducted to properly adjust the position of the needle. Once satisfactory placement of the needle was achieved, the numbing solution was slowly injected after negative aspiration for blood. 2.0 mL  of the nerve block solution was injected without difficulty or complication. After waiting for at least 3 minutes, the ablation was performed. Once completed, the needle was removed intact.  Radiofrequency lesioning (ablation):  Radiofrequency Generator: Medtronic AccurianTM AG 1000 RF Generator Sensory Stimulation Parameters: 50 Hz was used to locate & identify the nerve, making sure that the needle was positioned such that there was no sensory stimulation below 0.3 V or above 0.7 V. Motor Stimulation Parameters: 2 Hz was used to evaluate the motor component. Care was taken not to lesion any nerves that demonstrated motor stimulation of the lower extremities at an output of less than 2.5 times that of the sensory threshold, or a maximum of 2.0 V. Lesioning Technique Parameters: Standard Radiofrequency settings. (Not bipolar or pulsed.) Temperature Settings: 80 degrees C Lesioning time: 60 seconds Intra-operative Compliance: Compliant   6 cc solution made of 5 cc of 0.2% ropivacaine, 1 cc of Decadron 10 mg/cc.  2 cc injected at each level above on the left.   Once the entire procedure was completed, the treated area was cleaned, making sure to leave some of the prepping solution back to take advantage of its long term bactericidal properties.    Illustration of the posterior view of the lumbar spine and the posterior neural structures. Laminae of L2 through S1 are labeled. DPRL5, dorsal primary ramus of L5; DPRS1, dorsal primary ramus of S1; DPR3, dorsal primary ramus of L3; FJ, facet (zygapophyseal) joint L3-L4; I, inferior articular process of L4; LB1,  lateral branch of dorsal primary ramus of L1; IAB, inferior articular branches from L3 medial branch (supplies L4-L5 facet joint); IBP, intermediate branch plexus; MB3, medial branch of dorsal primary ramus of L3; NR3, third lumbar nerve root; S, superior articular process of L5; SAB, superior articular branches from L4 (supplies L4-5 facet joint also); TP3, transverse process of L3.  Vitals:   10/06/22 1024 10/06/22 1029 10/06/22 1034 10/06/22 1041  BP: 129/81 133/84 134/80 121/63  Pulse: 60 62 (!) 59 71  Resp: '16 17 16 16  '$ Temp:    (!) 97.5 F (36.4 C)  TempSrc:      SpO2: 100% 100% 100% 100%  Weight:      Height:        Start Time: 1016 hrs. End Time: 1033 hrs.  Imaging Guidance (Spinal):          Type of Imaging Technique: Fluoroscopy Guidance (Spinal) Indication(s): Assistance in needle guidance and placement for procedures requiring needle placement in or near specific anatomical locations not easily accessible without such assistance. Exposure Time: Please see nurses notes. Contrast: None used. Fluoroscopic Guidance: I was personally present during the use of fluoroscopy. "Tunnel Vision Technique" used to obtain the best possible view of the target area. Parallax error corrected before commencing the procedure. "Direction-depth-direction" technique used to introduce the needle under continuous pulsed fluoroscopy. Once target was reached, antero-posterior, oblique, and lateral fluoroscopic projection used confirm needle placement in all planes. Images permanently stored in EMR. Interpretation: No contrast injected. I personally interpreted the imaging intraoperatively. Adequate needle placement confirmed in multiple planes. Permanent images saved into the patient's record.  Antibiotic Prophylaxis:   Anti-infectives (From admission, onward)    None      Indication(s): None identified  Post-operative Assessment:  Post-procedure Vital Signs:  Pulse/HCG Rate: 71  Temp: (!)  97.5 F (36.4 C) Resp: 16 BP: 121/63 SpO2: 100 %  EBL: None  Complications: No immediate post-treatment complications observed by team, or reported  by patient.  Note: The patient tolerated the entire procedure well. A repeat set of vitals were taken after the procedure and the patient was kept under observation following institutional policy, for this type of procedure. Post-procedural neurological assessment was performed, showing return to baseline, prior to discharge. The patient was provided with post-procedure discharge instructions, including a section on how to identify potential problems. Should any problems arise concerning this procedure, the patient was given instructions to immediately contact us, at any time, without hesitation. In any case, we plan to contact the patient by telephone for a follow-up status report regarding this interventional procedure.  Comments:  No additional relevant information.  Plan of Care (POC)  Orders:  Orders Placed This Encounter  Procedures   DG PAIN CLINIC C-ARM 1-60 MIN NO REPORT    Intraoperative interpretation by procedural physician at [facility].    Standing Status:   Standing    Number of Occurrences:   1    Order Specific Question:   Reason for exam:    Answer:   Assistance in needle guidance and placement for procedures requiring needle placement in or near specific anatomical locations not easily accessible without such assistance.    Medications ordered for procedure: Meds ordered this encounter  Medications   lidocaine (XYLOCAINE) 2 % (with pres) injection 400 mg   lactated ringers infusion   midazolam (VERSED) injection 0.5-2 mg    Make sure Flumazenil is available in the pyxis when using this medication. If oversedation occurs, administer 0.2 mg IV over 15 sec. If after 45 sec no response, administer 0.2 mg again over 1 min; may repeat at 1 min intervals; not to exceed 4 doses (1 mg)   dexamethasone (DECADRON)  injection 10 mg   ropivacaine (PF) 2 mg/mL (0.2%) (NAROPIN) injection 9 mL   Medications administered: We administered lidocaine, lactated ringers, midazolam, dexamethasone, and ropivacaine (PF) 2 mg/mL (0.2%).  See the medical record for exact dosing, route, and time of administration.  Follow-up plan:   Return in about 2 weeks (around 10/20/2022) for Right L3, 4, 5 RFA, in clinic IV Versed.     Recent Visits Date Type Provider Dept  08/18/22 Office Visit Gillis Santa, MD [facility]  07/08/22 Office Visit Gillis Santa, MD [facility]  Showing recent visits within past 90 days and meeting all other requirements Today's Visits Date Type Provider Dept  10/06/22 Procedure visit Gillis Santa, MD [facility]  Showing today's visits and meeting all other requirements Future Appointments No visits were found meeting these conditions. Showing future appointments within next 90 days and meeting all other requirements  Disposition: Discharge home  Discharge (Date  Time): 10/06/2022; 1045 hrs.   Primary Care Physician: Carollee Leitz, MD Location: [facility] Note by: Gillis Santa, MD Date: 10/06/2022; Time: 10:56 AM  Disclaimer:  Medicine is not an exact science. The only guarantee in medicine is that nothing is guaranteed. It is important to note that the decision to proceed with this intervention was based on the information collected from the patient. The Data and conclusions were drawn from the patient's questionnaire, the interview, and the physical examination. Because the information was provided in large part by the patient, it cannot be guaranteed that it has not been purposely or unconsciously manipulated. Every effort has been made to obtain as much relevant data as possible for this evaluation. It is important to note that the conclusions that lead to this procedure are  derived in large part from the available data. Always  take into account that the treatment will also be dependent on availability of resources and existing treatment guidelines, considered by other Pain Management Practitioners as being common knowledge and practice, at the time of the intervention. For Medico-Legal purposes, it is also important to point out that variation in procedural techniques and pharmacological choices are the acceptable norm. The indications, contraindications, technique, and results of the above procedure should only be interpreted and judged by a Board-Certified Interventional Pain Specialist with extensive familiarity and expertise in the same exact procedure and technique.

## 2022-10-06 NOTE — Patient Instructions (Addendum)
Preparing for Procedure with Sedation Instructions: Oral Intake: Do not eat or drink anything for at least 8 hours prior to your procedure. Transportation: Public transportation is not allowed. Bring an adult driver. The driver must be physically present in our waiting room before any procedure can be started. Physical Assistance: Bring an adult capable of physically assisting you, in the event you need help. Blood Pressure Medicine: Take your blood pressure medicine with a sip of water the morning of the procedure. Insulin: Take only  of your normal insulin dose. Preventing infections: Shower with an antibacterial soap the morning of your procedure. Build-up your immune system: Take 1000 mg of Vitamin C with every meal (3 times a day) the day prior to your procedure. Pregnancy: If you are pregnant, call and cancel the procedure. Sickness: If you have a cold, fever, or any active infections, call and cancel the procedure. Arrival: You must be in the facility at least 30 minutes prior to your scheduled procedure. Children: Do not bring children with you. Dress appropriately: Bring dark clothing that you would not mind if they get stained. Valuables: Do not bring any jewelry or valuables. Procedure appointments are reserved for interventional treatments only. No Prescription Refills. No medication changes will be discussed during procedure appointments. No disability issues will be discussed.  ___________________________________________________________________________________________  Post-Radiofrequency (RF) Discharge Instructions  You have just completed a Radiofrequency Neurotomy.  The following instructions will provide you with information and guidelines for self-care upon discharge.  If at any time you have questions or concerns please call your physician. DO NOT DRIVE YOURSELF!!  Instructions: Apply ice: Fill a plastic sandwich bag with crushed ice. Cover it with a small towel and  apply to injection site. Apply for 15 minutes then remove x 15 minutes. Repeat sequence on day of procedure, until you go to bed. The purpose is to minimize swelling and discomfort after procedure. Apply heat: Apply heat to procedure site starting the day following the procedure. The purpose is to treat any soreness and discomfort from the procedure. Food intake: No eating limitations, unless stipulated above.  Nevertheless, if you have had sedation, you may experience some nausea.  In this case, it may be wise to wait at least two hours prior to resuming regular diet. Physical activities: Keep activities to a minimum for the first 8 hours after the procedure. For the first 24 hours after the procedure, do not drive a motor vehicle,  Operate heavy machinery, power tools, or handle any weapons.  Consider walking with the use of an assistive device or accompanied by an adult for the first 24 hours.  Do not drink alcoholic beverages including beer.  Do not make any important decisions or sign any legal documents. Go home and rest today.  Resume activities tomorrow, as tolerated.  Use caution in moving about as you may experience mild leg weakness.  Use caution in cooking, use of household electrical appliances and climbing steps. Driving: If you have received any sedation, you are not allowed to drive for 24 hours after your procedure. Blood thinner: Restart your blood thinner 6 hours after your procedure. (Only for those taking blood thinners) Insulin: As soon as you can eat, you may resume your normal dosing schedule. (Only for those taking insulin) Medications: May resume pre-procedure medications.  Do not take any drugs, other than what has been prescribed to you. Infection prevention: Keep procedure site clean and dry. Post-procedure Pain Diary: Extremely important that this be done correctly and accurately.  Recorded information will be used to determine the next step in treatment. Pain evaluated is that  of treated area only. Do not include pain from an untreated area. Complete every hour, on the hour, for the initial 8 hours. Set an alarm to help you do this part accurately. Do not go to sleep and have it completed later. It will not be accurate. Follow-up appointment: Keep your follow-up appointment after the procedure. Usually 2-6 weeks after radiofrequency. Bring you pain diary. The information collected will be essential for your long-term care.   Expect: From numbing medicine (AKA: Local Anesthetics): Numbness or decrease in pain. Onset: Full effect within 15 minutes of injected. Duration: It will depend on the type of local anesthetic used. On the average, 1 to 8 hours.  From steroids (when added): Decrease in swelling or inflammation. Once inflammation is improved, relief of the pain will follow. Onset of benefits: Depends on the amount of swelling present. The more swelling, the longer it will take for the benefits to be seen. In some cases, up to 10 days. Duration: Steroids will stay in the system x 2 weeks. Duration of benefits will depend on multiple posibilities including persistent irritating factors. From procedure: Some discomfort is to be expected once the numbing medicine wears off. In the case of radiofrequency procedures, this may last as long as 6 weeks. Additional post-procedure pain medication is provided for this. Discomfort is minimized if ice and heat are applied as instructed.  Call if: You experience numbness and weakness that gets worse with time, as opposed to wearing off. He experience any unusual bleeding, difficulty breathing, or loss of the ability to control your bowel and bladder. (This applies to Spinal procedures only) You experience any redness, swelling, heat, red streaks, elevated temperature, fever, or any other signs of a possible infection.  Emergency Numbers: [place] business hours (Monday - Thursday, 8:00 AM - 4:00 PM) (Friday, 9:00 AM - 12:00 Noon):  [phone] After hours: [phone] ____________________________________________________________________________________________

## 2022-10-06 NOTE — Progress Notes (Signed)
Safety precautions to be maintained throughout the outpatient stay will include: orient to surroundings, keep bed in low position, maintain call bell within reach at all times, provide assistance with transfer out of bed and ambulation.  

## 2022-10-07 ENCOUNTER — Telehealth: Payer: Self-pay

## 2022-10-07 NOTE — Telephone Encounter (Signed)
Post procedure follow up.  Patient states she is doing well.   ?

## 2022-10-08 ENCOUNTER — Telehealth (INDEPENDENT_AMBULATORY_CARE_PROVIDER_SITE_OTHER): Payer: 59 | Admitting: Family Medicine

## 2022-10-08 ENCOUNTER — Encounter: Payer: Self-pay | Admitting: Family Medicine

## 2022-10-08 VITALS — BP 128/76 | Temp 97.1°F

## 2022-10-08 DIAGNOSIS — J309 Allergic rhinitis, unspecified: Secondary | ICD-10-CM

## 2022-10-08 DIAGNOSIS — J321 Chronic frontal sinusitis: Secondary | ICD-10-CM

## 2022-10-08 MED ORDER — FLUTICASONE PROPIONATE 50 MCG/ACT NA SUSP: 2.0000 | Freq: Every day | NASAL | 6 refills | Status: DC

## 2022-10-08 MED ORDER — AMOXICILLIN-POT CLAVULANATE 875-125 MG PO TABS: 1.0000 | ORAL_TABLET | Freq: Two times a day (BID) | ORAL | 0 refills | Status: AC

## 2022-10-08 MED ORDER — SACCHAROMYCES BOULARDII 250 MG PO CAPS: 250.0000 mg | ORAL_CAPSULE | Freq: Every day | ORAL | 0 refills | Status: DC

## 2022-10-08 NOTE — Progress Notes (Signed)
Virtual Visit via Video note  I connected with Anita Gibbs on 10/08/22 at 1600 by video and verified that I am speaking with the correct person using two identifiers. Anita Gibbs is currently located at outside home and  is currently with her children during visit. The provider, Carollee Leitz, MD is located in their office at time of visit.  I discussed the limitations, risks, security and privacy concerns of performing an evaluation and management service by video and the availability of in person appointments. I also discussed with the patient that there may be a patient responsible charge related to this service. The patient expressed understanding and agreed to proceed.  Subjective: PCP: Carollee Leitz, MD  Chief Complaint  Patient presents with   Acute Visit    Sinus Pressure Ears Popping Coughing & Sneezing with White Mucous x 4 days    HPI Patient reports concern for sinus infection. Noted 4 days ago.  Right-sided headache with sinus pressure.  Also endorses nasal congestion, runny nose, sneezing and mucus.  Has noticed ear popping bilaterally, no pain, decrease in hearing or ear discharge.  Denies any fevers.  Has had increased use of albuterol.  Currently taking Zyrtec, Flonase and Robitussin which is helping a little.  Has had symptoms similar to this in the past.  Treated with antibiotics and steroids.   ROS: Per HPI  Current Outpatient Medications:    albuterol (VENTOLIN HFA) 108 (90 Base) MCG/ACT inhaler, Inhale 2 puffs into the lungs every 6 (six) hours as needed for wheezing or shortness of breath., Disp: 1 each, Rfl: 11   amoxicillin-clavulanate (AUGMENTIN) 875-125 MG tablet, Take 1 tablet by mouth 2 (two) times daily for 5 days., Disp: 10 tablet, Rfl: 0   atorvastatin (LIPITOR) 10 MG tablet, MWF at night, Disp: 90 tablet, Rfl: 3   cetirizine (ZYRTEC ALLERGY) 10 MG tablet, Take 1 tablet (10 mg total) by mouth at bedtime as needed., Disp: 90 tablet, Rfl: 3    Cholecalciferol (VITAMIN D-3) 125 MCG (5000 UT) TABS, Take by mouth daily., Disp: , Rfl:    Cyanocobalamin (B-12 PO), Take by mouth., Disp: , Rfl:    diphenhydrAMINE (BENADRYL) 25 mg capsule, Take 25 mg by mouth every 6 (six) hours as needed (for allergies.)., Disp: , Rfl:    DULoxetine (CYMBALTA) 20 MG capsule, Take 1 capsule (20 mg total) by mouth 2 (two) times daily., Disp: 180 capsule, Rfl: 3   EPINEPHrine (AUVI-Q) 0.3 mg/0.3 mL IJ SOAJ injection, Use as directed for severe allergic reaction, Disp: 2 Device, Rfl: 2   furosemide (LASIX) 20 MG tablet, Take 1 tablet (20 mg total) by mouth daily as needed. X 5-7 days, Disp: 30 tablet, Rfl: 11   hydrochlorothiazide (HYDRODIURIL) 25 MG tablet, Take 1 tablet (25 mg total) by mouth daily., Disp: , Rfl:    hydrocortisone 2.5 % cream, Apply topically 2 (two) times daily. Prn to eyelids, Disp: 30 g, Rfl: 0   losartan (COZAAR) 100 MG tablet, Take 1 tablet (100 mg total) by mouth daily., Disp: 90 tablet, Rfl: 3   meloxicam (MOBIC) 15 MG tablet, Take 1 tablet (15 mg total) by mouth every other day as needed., Disp: 45 tablet, Rfl: 11   methocarbamol (ROBAXIN) 750 MG tablet, Take 1 tablet (750 mg total) by mouth every 8 (eight) hours as needed for muscle spasms., Disp: 90 tablet, Rfl: 11   montelukast (SINGULAIR) 10 MG tablet, Take 1 tablet (10 mg total) by mouth at bedtime., Disp: 90  tablet, Rfl: 3   Olopatadine HCl 0.2 % SOLN, Apply 1 drop to eye daily. Both eyes, Disp: 2.5 mL, Rfl: 11   pantoprazole (PROTONIX) 40 MG tablet, TAKE ONE TABLET EVERY DAY 30 MIN BEFORE DINNER, Disp: 90 tablet, Rfl: 3   polyethylene glycol powder (GLYCOLAX/MIRALAX) 17 GM/SCOOP powder, Take 17 g by mouth 2 (two) times daily as needed., Disp: 3350 g, Rfl: 1   potassium chloride SA (KLOR-CON M) 20 MEQ tablet, Take 1 tablet (20 mEq total) by mouth daily as needed. With lasix, Disp: 30 tablet, Rfl: 11   pregabalin (LYRICA) 75 MG capsule, Take 1 capsule (75 mg total) by mouth 3 (three)  times daily., Disp: 90 capsule, Rfl: 11   saccharomyces boulardii (FLORASTOR) 250 MG capsule, Take 1 capsule (250 mg total) by mouth daily., Disp: 90 capsule, Rfl: 0   fluticasone (FLONASE) 50 MCG/ACT nasal spray, Place 2 sprays into both nostrils daily., Disp: 16 g, Rfl: 6  Observations/Objective: Physical Exam HENT:     Mouth/Throat:     Mouth: Mucous membranes are moist.    Assessment and Plan: Chronic frontal sinusitis Assessment & Plan: Chronic.  Stable.  Reviewed MRI head notable for mild ethmoid mucosal thickening.  Given sinus pressure and headaches with previous history of similar symptoms, by prescription for Augmentin 1 tablet twice daily x 5 days. Probiotics daily and for 2 weeks until completion of antibiotics. Increase hydration Humidified air Nettie pot Nasal saline Continue Flonase If no improvement follow-up PCP.  Orders: -     Amoxicillin-Pot Clavulanate; Take 1 tablet by mouth 2 (two) times daily for 5 days.  Dispense: 10 tablet; Refill: 0 -     Saccharomyces boulardii; Take 1 capsule (250 mg total) by mouth daily.  Dispense: 90 capsule; Refill: 0 -     Fluticasone Propionate; Place 2 sprays into both nostrils daily.  Dispense: 16 g; Refill: 6  Allergic rhinitis, unspecified seasonality, unspecified trigger Assessment & Plan: Chronic.  Stable. Refill Flonase. Continue Zyrtec 10 mg daily Continue singular 10 mg nightly.     Follow Up Instructions: Return if symptoms worsen or fail to improve.   I discussed the assessment and treatment plan with the patient. The patient was provided an opportunity to ask questions and all were answered. The patient agreed with the plan and demonstrated an understanding of the instructions.   The patient was advised to call back or seek an in-person evaluation if the symptoms worsen or if the condition fails to improve as anticipated.  The above assessment and management plan was discussed with the patient. The patient  verbalized understanding of and has agreed to the management plan. Patient is aware to call the clinic if symptoms persist or worsen. Patient is aware when to return to the clinic for a follow-up visit. Patient educated on when it is appropriate to go to the emergency department.   PDMP reviewed  Carollee Leitz, MD

## 2022-10-08 NOTE — Patient Instructions (Addendum)
It was a pleasure meeting you today. Thank you for allowing me to take part in your health care.  Our goals for today as we discussed include:  Start Augmenting 1 tablet 2 times a day for 5 days Start Probiotics daily and continue until antibiotics completed Refill Flonase 2 spray daily  Humidified air Nasal saline Increase hydration   If you have any questions or concerns, please do not hesitate to call the office at [phone].  I look forward to our next visit and until then take care and stay safe.  Regards,   Carollee Leitz, MD   [facility]

## 2022-10-08 NOTE — Assessment & Plan Note (Addendum)
Chronic.  Stable. Refill Flonase. Continue Zyrtec 10 mg daily Continue singular 10 mg nightly.

## 2022-10-08 NOTE — Assessment & Plan Note (Signed)
Chronic.  Stable.  Reviewed MRI head notable for mild ethmoid mucosal thickening.  Given sinus pressure and headaches with previous history of similar symptoms, by prescription for Augmentin 1 tablet twice daily x 5 days. Probiotics daily and for 2 weeks until completion of antibiotics. Increase hydration Humidified air Nettie pot Nasal saline Continue Flonase If no improvement follow-up PCP.

## 2022-10-16 ENCOUNTER — Other Ambulatory Visit: Payer: Self-pay | Admitting: Family Medicine

## 2022-10-16 DIAGNOSIS — Z1231 Encounter for screening mammogram for malignant neoplasm of breast: Secondary | ICD-10-CM

## 2022-10-27 ENCOUNTER — Encounter: Payer: Self-pay | Admitting: Student in an Organized Health Care Education/Training Program

## 2022-10-28 NOTE — Telephone Encounter (Signed)
done 

## 2022-10-29 ENCOUNTER — Ambulatory Visit
Admission: RE | Admit: 2022-10-29 | Discharge: 2022-10-29 | Disposition: A | Discharge status: Home / Self Care | Payer: 59 | Source: Ambulatory Visit | Attending: Student in an Organized Health Care Education/Training Program | Admitting: Student in an Organized Health Care Education/Training Program

## 2022-10-29 ENCOUNTER — Encounter: Payer: Self-pay | Admitting: Student in an Organized Health Care Education/Training Program

## 2022-10-29 ENCOUNTER — Ambulatory Visit
Payer: 59 | Attending: Student in an Organized Health Care Education/Training Program | Admitting: Student in an Organized Health Care Education/Training Program

## 2022-10-29 DIAGNOSIS — G8929 Other chronic pain: Secondary | ICD-10-CM | POA: Insufficient documentation

## 2022-10-29 DIAGNOSIS — G894 Chronic pain syndrome: Secondary | ICD-10-CM | POA: Diagnosis not present

## 2022-10-29 DIAGNOSIS — M5441 Lumbago with sciatica, right side: Secondary | ICD-10-CM | POA: Diagnosis not present

## 2022-10-29 DIAGNOSIS — M5442 Lumbago with sciatica, left side: Secondary | ICD-10-CM | POA: Insufficient documentation

## 2022-10-29 DIAGNOSIS — M47816 Spondylosis without myelopathy or radiculopathy, lumbar region: Secondary | ICD-10-CM

## 2022-10-29 MED ORDER — LACTATED RINGERS IV SOLN: Freq: Once | INTRAVENOUS | Status: AC

## 2022-10-29 MED ORDER — ROPIVACAINE HCL 2 MG/ML IJ SOLN
INTRAMUSCULAR | Status: AC
  Filled 2022-10-29: qty 20

## 2022-10-29 MED ORDER — ROPIVACAINE HCL 2 MG/ML IJ SOLN
9.0000 mL | Freq: Once | INTRAMUSCULAR | Status: AC
  Administered 2022-10-29: 9 mL via PERINEURAL

## 2022-10-29 MED ORDER — MIDAZOLAM HCL 2 MG/2ML IJ SOLN
INTRAMUSCULAR | Status: AC
  Filled 2022-10-29: qty 2

## 2022-10-29 MED ORDER — DEXAMETHASONE SODIUM PHOSPHATE 10 MG/ML IJ SOLN
INTRAMUSCULAR | Status: AC
  Filled 2022-10-29: qty 1

## 2022-10-29 MED ORDER — LIDOCAINE HCL 2 % IJ SOLN
INTRAMUSCULAR | Status: AC
  Filled 2022-10-29: qty 20

## 2022-10-29 MED ORDER — LIDOCAINE HCL 2 % IJ SOLN
20.0000 mL | Freq: Once | INTRAMUSCULAR | Status: AC
  Administered 2022-10-29: 400 mg

## 2022-10-29 MED ORDER — TRIAMCINOLONE ACETONIDE 40 MG/ML IJ SUSP
INTRAMUSCULAR | Status: AC
  Filled 2022-10-29: qty 1

## 2022-10-29 MED ORDER — MIDAZOLAM HCL 2 MG/2ML IJ SOLN
0.5000 mg | Freq: Once | INTRAMUSCULAR | Status: AC
  Administered 2022-10-29: 2 mg via INTRAVENOUS

## 2022-10-29 MED ORDER — DEXAMETHASONE SODIUM PHOSPHATE 10 MG/ML IJ SOLN
10.0000 mg | Freq: Once | INTRAMUSCULAR | Status: AC
  Administered 2022-10-29: 10 mg

## 2022-10-29 NOTE — Progress Notes (Signed)
Safety precautions to be maintained throughout the outpatient stay will include: orient to surroundings, keep bed in low position, maintain call bell within reach at all times, provide assistance with transfer out of bed and ambulation.  

## 2022-10-29 NOTE — Progress Notes (Signed)
PROVIDER NOTE: Interpretation of information contained herein should be left to medically-trained personnel. Specific patient instructions are provided elsewhere under "Patient Instructions" section of medical record. This document was created in part using STT-dictation technology, any transcriptional errors that may result from this process are unintentional.  Patient: Anita Gibbs Type: Established DOB: 1971-08-10 MRN: [id number] PCP: Carollee Leitz, MD  Service: Procedure DOS: 10/29/2022 Setting: Ambulatory Location: Ambulatory outpatient facility Delivery: Face-to-face Provider: Gillis Santa, MD Specialty: Interventional Pain Management Specialty designation: 09 Location: Outpatient facility Ref. Prov.: Gillis Santa, MD       Interventional Therapy     Procedure: Lumbar Facet, Medial Branch Radiofrequency Ablation (RFA) #1  Laterality: Right (-RT)  Level: L3, L4, and L5 Medial Branch Level(s). These levels will denervate the L3-4 and L4-5 lumbar facet joints.  Imaging: Fluoroscopy-guided         Anesthesia: Local anesthesia (1-2% Lidocaine) Anxiolysis: IV Versed         Sedation: Minimal Sedation                       DOS: 10/29/2022  Performed by: Gillis Santa, MD  Purpose: Therapeutic/Palliative Indications: Low back pain severe enough to impact quality of life or function. Indications: 1. Lumbar facet arthropathy   2. Lumbar facet syndrome (Bilateral) (R>L)   3. 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 Chronic pain syndrome    Anita Gibbs has been dealing with the above chronic pain for longer than three months and has either failed to respond, was unable to tolerate, or simply did not get enough benefit from other more conservative therapies including, but not limited to: 1. Over-the-counter medications 2. Anti-inflammatory medications 3. Muscle relaxants 4. Membrane stabilizers 5. Opioids 6. Physical therapy and/or  chiropractic manipulation 7. Modalities (Heat, ice, etc.) 8. Invasive techniques such as nerve blocks. Anita Gibbs has attained more than 50% relief of the pain from a series of diagnostic injections conducted in separate occasions.  Pain Score: Pre-procedure: 6 /10 Post-procedure: 0-No pain/10     Position / Prep / Materials:  Position: Prone  Prep solution: DuraPrep (Iodine Povacrylex [0.7% available iodine] and Isopropyl Alcohol, 74% w/w) Prep Area: Entire Lumbosacral Region (Lower back from mid-thoracic region to end of tailbone and from flank to flank.) Materials:  Tray: RFA (Radiofrequency) tray Needle(s):  Type: RFA (Teflon-coated radiofrequency ablation needles)   Pre-op H&P Assessment:  Anita Gibbs is a 52 y.o. (year old), female patient, seen today for interventional treatment. She  has a past surgical history that includes Knee arthroscopy (Right); Anterior cervical decomp/discectomy fusion (N/A, 07/01/2017); Abdominal hysterectomy (10/17/2020); and Carpal tunnel release (Bilateral). Anita Gibbs has a current medication list which includes the following prescription(s): albuterol, atorvastatin, cetirizine, vitamin d-3, cyanocobalamin, diphenhydramine, duloxetine, epinephrine, fluticasone, furosemide, hydrochlorothiazide, hydrocortisone, losartan, meloxicam, methocarbamol, montelukast, olopatadine hcl, pantoprazole, polyethylene glycol powder, potassium chloride sa, pregabalin, and saccharomyces boulardii, and the following Facility-Administered Medications: lactated ringers. Her primarily concern today is the Back Pain (Lower back)  Initial Vital Signs:  Pulse/HCG Rate: 64  Temp: 97.8 F (36.6 C) Resp: 17 BP: 124/67 SpO2: 100 %  BMI: Estimated body mass index is 43.05 kg/m as calculated from the following:   Height as of this encounter: '5\' 3"'$  (1.6 m).   Weight as of this encounter: 243 lb (110.2 kg).  Risk Assessment: Allergies: Reviewed. She is allergic to other,  pineapple, shellfish allergy, strawberry (diagnostic), and tomato.  Allergy Precautions:  None required Coagulopathies: Reviewed. None identified.  Blood-thinner therapy: None at this time Active Infection(s): Reviewed. None identified. Anita Gibbs is afebrile  Site Confirmation: Anita Gibbs was asked to confirm the procedure and laterality before marking the site Procedure checklist: Completed Consent: Before the procedure and under the influence of no sedative(s), amnesic(s), or anxiolytics, the patient was informed of the treatment options, risks and possible complications. To fulfill our ethical and legal obligations, as recommended by the American Medical Association's Code of Ethics, I have informed the patient of my clinical impression; the nature and purpose of the treatment or procedure; the risks, benefits, and possible complications of the intervention; the alternatives, including doing nothing; the risk(s) and benefit(s) of the alternative treatment(s) or procedure(s); and the risk(s) and benefit(s) of doing nothing. The patient was provided information about the general risks and possible complications associated with the procedure. These may include, but are not limited to: failure to achieve desired goals, infection, bleeding, organ or nerve damage, allergic reactions, paralysis, and death. In addition, the patient was informed of those risks and complications associated to Spine-related procedures, such as failure to decrease pain; infection (i.e.: Meningitis, epidural or intraspinal abscess); bleeding (i.e.: epidural hematoma, subarachnoid hemorrhage, or any other type of intraspinal or peri-dural bleeding); organ or nerve damage (i.e.: Any type of peripheral nerve, nerve root, or spinal cord injury) with subsequent damage to sensory, motor, and/or autonomic systems, resulting in permanent pain, numbness, and/or weakness of one or several areas of the body; allergic reactions; (i.e.:  anaphylactic reaction); and/or death. Furthermore, the patient was informed of those risks and complications associated with the medications. These include, but are not limited to: allergic reactions (i.e.: anaphylactic or anaphylactoid reaction(s)); adrenal axis suppression; blood sugar elevation that in diabetics may result in ketoacidosis or comma; water retention that in patients with history of congestive heart failure may result in shortness of breath, pulmonary edema, and decompensation with resultant heart failure; weight gain; swelling or edema; medication-induced neural toxicity; particulate matter embolism and blood vessel occlusion with resultant organ, and/or nervous system infarction; and/or aseptic necrosis of one or more joints. Finally, the patient was informed that Medicine is not an exact science; therefore, there is also the possibility of unforeseen or unpredictable risks and/or possible complications that may result in a catastrophic outcome. The patient indicated having understood very clearly. We have given the patient no guarantees and we have made no promises. Enough time was given to the patient to ask questions, all of which were answered to the patient's satisfaction. Ms. Horie has indicated that she wanted to continue with the procedure. Attestation: I, the ordering provider, attest that I have discussed with the patient the benefits, risks, side-effects, alternatives, likelihood of achieving goals, and potential problems during recovery for the procedure that I have provided informed consent. Date  Time: 10/29/2022  7:57 AM   Pre-Procedure Preparation:  Monitoring: As per clinic protocol. Respiration, ETCO2, SpO2, BP, heart rate and rhythm monitor placed and checked for adequate function Safety Precautions: Patient was assessed for positional comfort and pressure points before starting the procedure. Time-out: I initiated and conducted the "Time-out" before starting the  procedure, as per protocol. The patient was asked to participate by confirming the accuracy of the "Time Out" information. Verification of the correct person, site, and procedure were performed and confirmed by me, the nursing staff, and the patient. "Time-out" conducted as per Joint Commission's Universal Protocol (UP.01.01.01). Time: 0827  Description of Procedure:  Laterality: See above. Levels:  See above. Safety Precautions: Aspiration looking for blood return was conducted prior to all injections. At no point did we inject any substances, as a needle was being advanced. Before injecting, the patient was told to immediately notify me if she was experiencing any new onset of "ringing in the ears, or metallic taste in the mouth". No attempts were made at seeking any paresthesias. Safe injection practices and needle disposal techniques used. Medications properly checked for expiration dates. SDV (single dose vial) medications used. After the completion of the procedure, all disposable equipment used was discarded in the proper designated medical waste containers. Local Anesthesia: Protocol guidelines were followed. The patient was positioned over the fluoroscopy table. The area was prepped in the usual manner. The time-out was completed. The target area was identified using fluoroscopy. A 12-in long, straight, sterile hemostat was used with fluoroscopic guidance to locate the targets for each level blocked. Once located, the skin was marked with an approved surgical skin marker. Once all sites were marked, the skin (epidermis, dermis, and hypodermis), as well as deeper tissues (fat, connective tissue and muscle) were infiltrated with a small amount of a short-acting local anesthetic, loaded on a 10cc syringe with a 25G, 1.5-in  Needle. An appropriate amount of time was allowed for local anesthetics to take effect before proceeding to the next step. Technical description of process:  Radiofrequency  Ablation (RFA) L3 Medial Branch Nerve RFA: The target area for the L3 medial branch is at the junction of the postero-lateral aspect of the superior articular process and the superior, posterior, and medial edge of the transverse process of L4. Under fluoroscopic guidance, a Radiofrequency needle was inserted until contact was made with os over the superior postero-lateral aspect of the pedicular shadow (target area). Sensory and motor testing was conducted to properly adjust the position of the needle. Once satisfactory placement of the needle was achieved, the numbing solution was slowly injected after negative aspiration for blood. 2.0 mL of the nerve block solution was injected without difficulty or complication. After waiting for at least 3 minutes, the ablation was performed. Once completed, the needle was removed intact. L4 Medial Branch Nerve RFA: The target area for the L4 medial branch is at the junction of the postero-lateral aspect of the superior articular process and the superior, posterior, and medial edge of the transverse process of L5. Under fluoroscopic guidance, a Radiofrequency needle was inserted until contact was made with os over the superior postero-lateral aspect of the pedicular shadow (target area). Sensory and motor testing was conducted to properly adjust the position of the needle. Once satisfactory placement of the needle was achieved, the numbing solution was slowly injected after negative aspiration for blood. 2.0 mL of the nerve block solution was injected without difficulty or complication. After waiting for at least 3 minutes, the ablation was performed. Once completed, the needle was removed intact. L5 Medial Branch Nerve RFA: The target area for the L5 medial branch is at the junction of the postero-lateral aspect of the superior articular process of S1 and the superior, posterior, and medial edge of the sacral ala. Under fluoroscopic guidance, a Radiofrequency needle was  inserted until contact was made with os over the superior postero-lateral aspect of the pedicular shadow (target area). Sensory and motor testing was conducted to properly adjust the position of the needle. Once satisfactory placement of the needle was achieved, the numbing solution was slowly injected after negative aspiration for blood. 2.0 mL  of the nerve block solution was injected without difficulty or complication. After waiting for at least 3 minutes, the ablation was performed. Once completed, the needle was removed intact.  Radiofrequency lesioning (ablation):  Radiofrequency Generator: Medtronic AccurianTM AG 1000 RF Generator Sensory Stimulation Parameters: 50 Hz was used to locate & identify the nerve, making sure that the needle was positioned such that there was no sensory stimulation below 0.3 V or above 0.7 V. Motor Stimulation Parameters: 2 Hz was used to evaluate the motor component. Care was taken not to lesion any nerves that demonstrated motor stimulation of the lower extremities at an output of less than 2.5 times that of the sensory threshold, or a maximum of 2.0 V. Lesioning Technique Parameters: Standard Radiofrequency settings. (Not bipolar or pulsed.) Temperature Settings: 80 degrees C Lesioning time: 60 seconds Intra-operative Compliance: Compliant   6 cc solution made of 5 cc of 0.2% ropivacaine, 1 cc of Decadron 10 mg/cc.  2 cc injected at each level above on the right   Once the entire procedure was completed, the treated area was cleaned, making sure to leave some of the prepping solution back to take advantage of its long term bactericidal properties.    Illustration of the posterior view of the lumbar spine and the posterior neural structures. Laminae of L2 through S1 are labeled. DPRL5, dorsal primary ramus of L5; DPRS1, dorsal primary ramus of S1; DPR3, dorsal primary ramus of L3; FJ, facet (zygapophyseal) joint L3-L4; I, inferior articular process of L4; LB1,  lateral branch of dorsal primary ramus of L1; IAB, inferior articular branches from L3 medial branch (supplies L4-L5 facet joint); IBP, intermediate branch plexus; MB3, medial branch of dorsal primary ramus of L3; NR3, third lumbar nerve root; S, superior articular process of L5; SAB, superior articular branches from L4 (supplies L4-5 facet joint also); TP3, transverse process of L3.  Vitals:   10/29/22 0840 10/29/22 0845 10/29/22 0850 10/29/22 0852  BP: 121/76 122/78 (!) 111/57 120/72  Pulse: 61 (!) 59  65  Resp: '15 16 16 16  '$ Temp:   (!) 97 F (36.1 C) (!) 97 F (36.1 C)  TempSrc:      SpO2: 97% 99% 100% 95%  Weight:      Height:        Start Time: 0827 hrs. End Time: 0842 hrs.  Imaging Guidance (Spinal):          Type of Imaging Technique: Fluoroscopy Guidance (Spinal) Indication(s): Assistance in needle guidance and placement for procedures requiring needle placement in or near specific anatomical locations not easily accessible without such assistance. Exposure Time: Please see nurses notes. Contrast: None used. Fluoroscopic Guidance: I was personally present during the use of fluoroscopy. "Tunnel Vision Technique" used to obtain the best possible view of the target area. Parallax error corrected before commencing the procedure. "Direction-depth-direction" technique used to introduce the needle under continuous pulsed fluoroscopy. Once target was reached, antero-posterior, oblique, and lateral fluoroscopic projection used confirm needle placement in all planes. Images permanently stored in EMR. Interpretation: No contrast injected. I personally interpreted the imaging intraoperatively. Adequate needle placement confirmed in multiple planes. Permanent images saved into the patient's record.  Antibiotic Prophylaxis:   Anti-infectives (From admission, onward)    None      Indication(s): None identified  Post-operative Assessment:  Post-procedure Vital Signs:  Pulse/HCG Rate:  65  Temp: (!) 97 F (36.1 C) Resp: 16 BP: 120/72 SpO2: 95 %  EBL: None  Complications: No immediate post-treatment complications  observed by team, or reported by patient.  Note: The patient tolerated the entire procedure well. A repeat set of vitals were taken after the procedure and the patient was kept under observation following institutional policy, for this type of procedure. Post-procedural neurological assessment was performed, showing return to baseline, prior to discharge. The patient was provided with post-procedure discharge instructions, including a section on how to identify potential problems. Should any problems arise concerning this procedure, the patient was given instructions to immediately contact us, at any time, without hesitation. In any case, we plan to contact the patient by telephone for a follow-up status report regarding this interventional procedure.  Comments:  No additional relevant information.  Plan of Care (POC)  Orders:  Orders Placed This Encounter  Procedures   DG PAIN CLINIC C-ARM 1-60 MIN NO REPORT    Intraoperative interpretation by procedural physician at [place].    Standing Status:   Standing    Number of Occurrences:   1    Order Specific Question:   Reason for exam:    Answer:   Assistance in needle guidance and placement for procedures requiring needle placement in or near specific anatomical locations not easily accessible without such assistance.    Medications ordered for procedure: Meds ordered this encounter  Medications   lidocaine (XYLOCAINE) 2 % (with pres) injection 400 mg   lactated ringers infusion   midazolam (VERSED) injection 0.5-2 mg    Make sure Flumazenil is available in the pyxis when using this medication. If oversedation occurs, administer 0.2 mg IV over 15 sec. If after 45 sec no response, administer 0.2 mg again over 1 min; may repeat at 1 min intervals; not to exceed 4 doses (1 mg)   dexamethasone  (DECADRON) injection 10 mg   ropivacaine (PF) 2 mg/mL (0.2%) (NAROPIN) injection 9 mL   Medications administered: We administered lidocaine, lactated ringers, midazolam, dexamethasone, and ropivacaine (PF) 2 mg/mL (0.2%).  See the medical record for exact dosing, route, and time of administration.  Follow-up plan:   Return in about 2 months (around 12/30/2022) for Post Procedure Evaluation, in person.     Recent Visits Date Type Provider Dept  10/06/22 Procedure visit Gillis Santa, MD [facility]  08/18/22 Office Visit Gillis Santa, MD [facility]  Showing recent visits within past 90 days and meeting all other requirements Today's Visits Date Type Provider Dept  10/29/22 Procedure visit Gillis Santa, MD [facility]  Showing today's visits and meeting all other requirements Future Appointments Date Type Provider Dept  12/31/22 Appointment Gillis Santa, MD [facility]  Showing future appointments within next 90 days and meeting all other requirements  Disposition: Discharge home  Discharge (Date  Time): 10/29/2022; 0853 hrs.   Primary Care Physician: Carollee Leitz, MD Location: [facility] Note by: Gillis Santa, MD Date: 10/29/2022; Time: 9:04 AM  Disclaimer:  Medicine is not an exact science. The only guarantee in medicine is that nothing is guaranteed. It is important to note that the decision to proceed with this intervention was based on the information collected from the patient. The Data and conclusions were drawn from the patient's questionnaire, the interview, and the physical examination. Because the information was provided in large part by the patient, it cannot be guaranteed that it has not been purposely or unconsciously manipulated. Every effort has been made to obtain as much relevant data as possible for this evaluation. It is important to note that  the conclusions that lead to this procedure are  derived in large part from the available data. Always take into account that the treatment will also be dependent on availability of resources and existing treatment guidelines, considered by other Pain Management Practitioners as being common knowledge and practice, at the time of the intervention. For Medico-Legal purposes, it is also important to point out that variation in procedural techniques and pharmacological choices are the acceptable norm. The indications, contraindications, technique, and results of the above procedure should only be interpreted and judged by a Board-Certified Interventional Pain Specialist with extensive familiarity and expertise in the same exact procedure and technique.

## 2022-10-30 NOTE — Progress Notes (Signed)
Called patient PP , no answer, left message to call if needed.

## 2022-11-10 ENCOUNTER — Telehealth: Payer: Self-pay

## 2022-11-10 NOTE — Telephone Encounter (Signed)
Called and spoke with pt to go over meds and confirm pharmacy,

## 2022-11-10 NOTE — Progress Notes (Unsigned)
Tomasita Morrow, NP-C Phone: [phone]  Anita Gibbs is a 52 y.o. female who presents today for vaginal discharge.  Vaginitis: Patient complains of an abnormal vaginal discharge for 1 week. Vaginal symptoms include white, thick, odorless discharge. Vulvar symptoms include none. STI Risk: Very low risk of STD exposure. Patient has one partner. Discharge described as: scant, white, and thick. Other associated symptoms: none. Contraception: status post hysterectomy   Social History   Tobacco Use  Smoking Status Never  Smokeless Tobacco Never    Current Outpatient Medications on File Prior to Visit  Medication Sig Dispense Refill   albuterol (VENTOLIN HFA) 108 (90 Base) MCG/ACT inhaler Inhale 2 puffs into the lungs every 6 (six) hours as needed for wheezing or shortness of breath. 1 each 11   atorvastatin (LIPITOR) 10 MG tablet MWF at night 90 tablet 3   cetirizine (ZYRTEC ALLERGY) 10 MG tablet Take 1 tablet (10 mg total) by mouth at bedtime as needed. 90 tablet 3   Cholecalciferol (VITAMIN D-3) 125 MCG (5000 UT) TABS Take by mouth daily.     Cyanocobalamin (B-12 PO) Take by mouth.     diphenhydrAMINE (BENADRYL) 25 mg capsule Take 25 mg by mouth every 6 (six) hours as needed (for allergies.).     DULoxetine (CYMBALTA) 20 MG capsule Take 1 capsule (20 mg total) by mouth 2 (two) times daily. 180 capsule 3   EPINEPHrine (AUVI-Q) 0.3 mg/0.3 mL IJ SOAJ injection Use as directed for severe allergic reaction 2 Device 2   fluticasone (FLONASE) 50 MCG/ACT nasal spray Place 2 sprays into both nostrils daily. 16 g 6   furosemide (LASIX) 20 MG tablet Take 1 tablet (20 mg total) by mouth daily as needed. X 5-7 days 30 tablet 11   hydrochlorothiazide (HYDRODIURIL) 25 MG tablet Take 1 tablet (25 mg total) by mouth daily.     hydrocortisone 2.5 % cream Apply topically 2 (two) times daily. Prn to eyelids 30 g 0   losartan (COZAAR) 100 MG tablet Take 1 tablet (100 mg total) by mouth daily. 90 tablet 3    meloxicam (MOBIC) 15 MG tablet Take 1 tablet (15 mg total) by mouth every other day as needed. 45 tablet 11   methocarbamol (ROBAXIN) 750 MG tablet Take 1 tablet (750 mg total) by mouth every 8 (eight) hours as needed for muscle spasms. 90 tablet 11   montelukast (SINGULAIR) 10 MG tablet Take 1 tablet (10 mg total) by mouth at bedtime. 90 tablet 3   Olopatadine HCl 0.2 % SOLN Apply 1 drop to eye daily. Both eyes 2.5 mL 11   pantoprazole (PROTONIX) 40 MG tablet TAKE ONE TABLET EVERY DAY 30 MIN BEFORE DINNER 90 tablet 3   polyethylene glycol powder (GLYCOLAX/MIRALAX) 17 GM/SCOOP powder Take 17 g by mouth 2 (two) times daily as needed. 3350 g 1   potassium chloride SA (KLOR-CON M) 20 MEQ tablet Take 1 tablet (20 mEq total) by mouth daily as needed. With lasix 30 tablet 11   pregabalin (LYRICA) 75 MG capsule Take 1 capsule (75 mg total) by mouth 3 (three) times daily. 90 capsule 11   saccharomyces boulardii (FLORASTOR) 250 MG capsule Take 1 capsule (250 mg total) by mouth daily. 90 capsule 0   No current facility-administered medications on file prior to visit.    ROS see history of present illness  Objective  Physical Exam Vitals:   11/11/22 0910  BP: (!) 140/80  Pulse: 82  Temp: 98.5 F (36.9 C)  SpO2:  99%    BP Readings from Last 3 Encounters:  11/11/22 (!) 140/80  10/29/22 120/72  10/08/22 128/76   Wt Readings from Last 3 Encounters:  11/11/22 246 lb 6.4 oz (111.8 kg)  10/29/22 243 lb (110.2 kg)  10/06/22 243 lb (110.2 kg)    Physical Exam Constitutional:      General: She is not in acute distress.    Appearance: Normal appearance.  HENT:     Head: Normocephalic.  Cardiovascular:     Rate and Rhythm: Normal rate and regular rhythm.     Heart sounds: Normal heart sounds.  Pulmonary:     Effort: Pulmonary effort is normal.     Breath sounds: Normal breath sounds.  Skin:    General: Skin is warm and dry.  Neurological:     General: No focal deficit present.      Mental Status: She is alert.  Psychiatric:        Mood and Affect: Mood normal.        Behavior: Behavior normal.    Assessment/Plan: Please see individual problem list.  Vaginal discharge Assessment & Plan: Increased thick, white discharge x 1 week. No other symptoms, denies itching. Vaginal swab obtained, patient self-swabbed. Will contact patient with results. Will treat with Diflucan 150 mg x 1 dose. Encouraged patient to return if symptoms are changing or worsening.   Orders: -     Cervicovaginal ancillary only -     Fluconazole; Take 1 tablet by mouth x 1 dose. May repeat in 72 hours if needed.  Dispense: 2 tablet; Refill: 0   Return if symptoms worsen or fail to improve.   Tomasita Morrow, NP-C [place]

## 2022-11-11 ENCOUNTER — Encounter: Payer: Self-pay | Admitting: Nurse Practitioner

## 2022-11-11 ENCOUNTER — Other Ambulatory Visit (HOSPITAL_COMMUNITY)
Admission: RE | Admit: 2022-11-11 | Discharge: 2022-11-11 | Disposition: A | Discharge status: Home / Self Care | Payer: 59 | Source: Ambulatory Visit | Attending: Family Medicine | Admitting: Family Medicine

## 2022-11-11 ENCOUNTER — Ambulatory Visit (INDEPENDENT_AMBULATORY_CARE_PROVIDER_SITE_OTHER): Payer: 59 | Admitting: Nurse Practitioner

## 2022-11-11 VITALS — BP 140/80 | HR 82 | Temp 98.5°F | Ht 63.0 in | Wt 246.4 lb

## 2022-11-11 DIAGNOSIS — N898 Other specified noninflammatory disorders of vagina: Secondary | ICD-10-CM | POA: Diagnosis not present

## 2022-11-11 MED ORDER — FLUCONAZOLE 150 MG PO TABS: ORAL_TABLET | ORAL | 0 refills | Status: DC

## 2022-11-11 NOTE — Assessment & Plan Note (Signed)
Increased thick, white discharge x 1 week. No other symptoms, denies itching. Vaginal swab obtained, patient self-swabbed. Will contact patient with results. Will treat with Diflucan 150 mg x 1 dose. Encouraged patient to return if symptoms are changing or worsening.

## 2022-11-13 ENCOUNTER — Ambulatory Visit: Payer: 59 | Admitting: Family Medicine

## 2022-11-13 LAB — CERVICOVAGINAL ANCILLARY ONLY
Bacterial Vaginitis (gardnerella): NEGATIVE
Candida Glabrata: NEGATIVE
Candida Vaginitis: POSITIVE — AB
Chlamydia: NEGATIVE
Comment: NEGATIVE
Comment: NEGATIVE
Comment: NEGATIVE
Comment: NEGATIVE
Comment: NEGATIVE
Comment: NORMAL
Neisseria Gonorrhea: NEGATIVE
Trichomonas: NEGATIVE

## 2022-11-17 ENCOUNTER — Ambulatory Visit
Admission: RE | Admit: 2022-11-17 | Discharge: 2022-11-17 | Disposition: A | Discharge status: Home / Self Care | Payer: 59 | Source: Ambulatory Visit | Attending: Family Medicine | Admitting: Family Medicine

## 2022-11-17 DIAGNOSIS — Z1231 Encounter for screening mammogram for malignant neoplasm of breast: Secondary | ICD-10-CM | POA: Insufficient documentation

## 2022-11-24 ENCOUNTER — Other Ambulatory Visit: Payer: Self-pay | Admitting: Allergy and Immunology

## 2022-11-24 ENCOUNTER — Other Ambulatory Visit: Payer: Self-pay | Admitting: Student in an Organized Health Care Education/Training Program

## 2022-11-24 DIAGNOSIS — G8929 Other chronic pain: Secondary | ICD-10-CM

## 2022-11-24 DIAGNOSIS — G894 Chronic pain syndrome: Secondary | ICD-10-CM

## 2022-11-24 DIAGNOSIS — M5416 Radiculopathy, lumbar region: Secondary | ICD-10-CM

## 2022-11-24 DIAGNOSIS — M792 Neuralgia and neuritis, unspecified: Secondary | ICD-10-CM

## 2022-11-25 ENCOUNTER — Telehealth: Payer: Self-pay

## 2022-11-26 MED ORDER — FAMOTIDINE 20 MG PO TABS: 20.0000 mg | ORAL_TABLET | Freq: Every day | ORAL | 0 refills | Status: DC

## 2022-11-26 NOTE — Telephone Encounter (Signed)
Sent to pharmacy 

## 2022-12-09 ENCOUNTER — Ambulatory Visit: Payer: 59 | Admitting: Nurse Practitioner

## 2022-12-10 ENCOUNTER — Ambulatory Visit: Payer: 59 | Admitting: Family Medicine

## 2022-12-30 ENCOUNTER — Other Ambulatory Visit: Payer: Self-pay

## 2022-12-30 MED ORDER — FAMOTIDINE 20 MG PO TABS: 20.0000 mg | ORAL_TABLET | Freq: Every day | ORAL | 3 refills | Status: DC

## 2022-12-31 ENCOUNTER — Ambulatory Visit: Payer: 59 | Admitting: Student in an Organized Health Care Education/Training Program

## 2023-01-02 ENCOUNTER — Telehealth: Payer: Self-pay | Admitting: Family Medicine

## 2023-01-02 NOTE — Telephone Encounter (Signed)
Prescription Request  01/02/2023  LOV: 08/05/2022  What is the name of the medication or equipment? Dicyclomine HCL 20 mg tag  Have you contacted your pharmacy to request a refill? Yes   Which pharmacy would you like this sent to?   [facility] - [place], [place] - [address] [place] [address] Phone: [phone] Fax: [phone]    Patient notified that their request is being sent to the clinical staff for review and that they should receive a response within 2 business days.   Please advise at Mobile [phone] (mobile)

## 2023-01-05 NOTE — Telephone Encounter (Signed)
Discontinued by: Dana Allan, MD on 08/17/2022 00:48  Reason: Patient has not taken in last 30 days    Please review, thanks!

## 2023-01-15 ENCOUNTER — Other Ambulatory Visit: Payer: Self-pay | Admitting: Family Medicine

## 2023-01-20 ENCOUNTER — Other Ambulatory Visit: Payer: Self-pay | Admitting: Family Medicine

## 2023-02-16 DIAGNOSIS — G5603 Carpal tunnel syndrome, bilateral upper limbs: Secondary | ICD-10-CM | POA: Diagnosis not present

## 2023-02-16 DIAGNOSIS — M654 Radial styloid tenosynovitis [de Quervain]: Secondary | ICD-10-CM | POA: Diagnosis not present

## 2023-02-17 DIAGNOSIS — M654 Radial styloid tenosynovitis [de Quervain]: Secondary | ICD-10-CM | POA: Insufficient documentation

## 2023-02-23 ENCOUNTER — Encounter: Payer: Self-pay | Admitting: Internal Medicine

## 2023-02-23 ENCOUNTER — Ambulatory Visit: Payer: 59 | Admitting: Internal Medicine

## 2023-02-23 VITALS — BP 136/90 | HR 79 | Ht 63.0 in | Wt 231.0 lb

## 2023-02-23 DIAGNOSIS — D3502 Benign neoplasm of left adrenal gland: Secondary | ICD-10-CM

## 2023-02-23 DIAGNOSIS — I10 Essential (primary) hypertension: Secondary | ICD-10-CM

## 2023-02-23 LAB — POTASSIUM: Potassium: 3.6 mEq/L (ref 3.5–5.1)

## 2023-02-23 NOTE — Progress Notes (Unsigned)
Name: Anita Gibbs  MRN/ DOB: [id number], Jun 22, 1971    Age/ Sex: 52 y.o., female     PCP: Dana Allan, MD   Reason for Endocrinology Evaluation: Left adrenal adenoma      Initial Endocrinology Clinic Visit: 07/31/2020    PATIENT IDENTIFIER: Anita Gibbs is a 52 y.o., female with a past medical history of HTN and dyslipidemia . She has followed with [facility] since 07/31/2020 for consultative assistance with management of her Left adrenal adenoma .   HISTORICAL SUMMARY:   She had an incidental left adrenal adenoma on a CT scan in 06/2020 during evaluation for abdominal pain that measures 1.4 x 1.2 cm and 48 Hounsfield units.   No family history of adrenal disease   Mother and sister with DM  SUBJECTIVE:     Today (02/23/2023):  [name] is here for left adrenal adenoma    Substantial weight gain-no  Severe hypertension-no DM- no Proximal muscle weakness-yes Sudden/ severe headaches- no Anxiety attacks- yes Palpitations- occasionally  Fluid retention- yes Hypokalemia- not recently     She has lightheadedness    HISTORY:  Past Medical History:  Past Medical History:  Diagnosis Date   Abdominal pain 06/11/2020   Abnormal drug screen 03/06/2020   Abnormal MRI, cervical spine (06/10/2018) 09/27/2018   FINDINGS:  Vertebrae: fusion hardware at C4, C5, C6, and C7.  Posterior Fossa, vertebral arteries, paraspinal tissues: A relatively empty sella present.     Disc levels:  C3-4: Negative. A mild broad-based disc osteophyte complex present. Uncovertebral spurring contributes to mild foraminal narrowing bilaterally.  C4-5: A leftward disc osteophyte complex is present. Uncovertebral and facet disease   Acute cystitis without hematuria 06/18/2020   Acute vaginitis 06/11/2020   Adenoma of left adrenal gland 08/01/2020   Adrenal nodule (HCC) 06/20/2020   ANA positive 02/01/2021   Anxiety and depression 01/29/2021   Arthritis    Back pain  with history of spinal surgery 01/29/2021   Bilateral carpal tunnel syndrome 01/29/2021   Bilateral leg edema 01/29/2021   BMI 40.0-44.9, adult (HCC) 04/30/2022   Carpal boss, right    Carpal tunnel syndrome, bilateral    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) 09/27/2018   Levels:  C4-5: There is partial effacement of ventral CSF.     IMPRESSION:  Mild residual central canal narrowing at C4-5 s/p ACDF.   Cervical facet hypertrophy (C3-T1) 09/27/2018   Levels:  C3-4: Uncovertebral spurring  C4-5: Uncovertebral and facet disease  C5-6: Residual uncovertebral disease.  C6-7: Residual uncovertebral disease.  C7-T1: Minimal left-sided uncovertebral spurring   Cervical facet joint syndrome (Bilateral) (L>R) 09/27/2018   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-5, C5-6, and C6-7) 09/27/2018   Levels:  C3-4: Mild foraminal narrowing bilaterally.  C4-5: Mild left foraminal narrowing.  C5-6: Mild left foraminal narrowing is due to residual uncovertebral disease.  C6-7: Mild left foraminal narrowing is due to residual uncovertebral disease.   Cervical myelopathy (HCC) 07/01/2017   Cervicalgia (Primary Area of Pain) (Bilateral) (L>R) 09/27/2018   Chronic gout of foot (Left) 05/31/2018   Chronic low back pain [facility] Area of Pain) (Bilateral) (R>L) w/ sciatica (Bilateral) 09/07/2018   Chronic low back pain [facility] Area of Pain) (Bilateral) (R>L) w/o sciatica 07/26/2018   Chronic lower extremity pain (Fourth Area of Pain) (Bilateral) (R>L) 09/07/2018   Chronic musculoskeletal pain 10/27/2018   Chronic neck pain (Bilateral) w/ history of cervical spinal surgery 09/27/2018   Chronic neck pain (Primary Area of  Pain) (Bilateral) (L>R) 09/07/2018   Chronic sacroiliac joint dysfunction (Bilateral) 09/27/2018   Chronic sacroiliac joint pain (Right) 09/07/2018   Chronic upper extremity pain (Secondary Area of Pain) (Bilateral) (R>L) 09/27/2018   Disorder of skeletal system 09/07/2018   E.  coli UTI (urinary tract infection) 06/20/2020   Elevated sed rate 09/08/2018   Elevated serum creatinine 06/20/2020   Excessive daytime sleepiness 03/06/2020   Female pelvic pain 09/05/2020   Headache    History of allergy to shellfish 09/28/2018   History of fusion of cervical spine (ACDF C4-C7) 09/27/2018   History of illicit drug use 03/07/2020   Hives 09/05/2020   Hives 09/05/2020   Hyperkalemia 05/31/2018   Mild   Hyperlipidemia 04/30/2022   Hypertension    Large breasts 05/30/2020   Left hip pain 07/21/2022   Lumbar facet arthropathy 04/20/2019   Lumbar radiculitis (L5 dermatome) (Right) 04/17/2020   Multiple environmental allergies 01/17/2020   Multiple food allergies 03/07/2020   Neurogenic pain 10/27/2018   Numbness and tingling of both feet 05/31/2018   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   Other intervertebral disc degeneration, lumbar region 11/15/2019   Pharmacologic therapy 09/07/2018   Plantar fasciitis, bilateral 03/04/2021   Polyneuropathy 01/29/2021   Prediabetes 10/10/2019   Problems influencing health status 09/07/2018   Rash 08/08/2019   Seasonal allergic rhinitis due to pollen 11/29/2018   Seasonal allergies 03/06/2020   Seasonal asthma 11/29/2018   Somatic dysfunction of sacroiliac joint (Bilateral) 09/27/2018   Spondylosis without myelopathy or radiculopathy, cervical region 03/22/2019   Spondylosis without myelopathy or radiculopathy, lumbosacral region 05/19/2019   Spondylosis, cervical, w/ myelopathy 09/27/2018   Vaginal discharge 06/11/2020   Past Surgical History:  Past Surgical History:  Procedure Laterality Date   ABDOMINAL HYSTERECTOMY  10/17/2020   [facility]; she has both ovaries   ANTERIOR CERVICAL DECOMP/DISCECTOMY FUSION N/A 07/01/2017   Procedure: ANTERIOR CERVICAL DECOMPRESSION/DISCECTOMY FUSION 3 LEVELS;  Surgeon: Venetia Night, MD;  Location: [facility] ORS;  Service: Neurosurgery;  Laterality: N/A;   CARPAL TUNNEL  RELEASE Bilateral    KNEE ARTHROSCOPY Right    Social History:  reports that she has never smoked. She has never used smokeless tobacco. She reports current alcohol use of about 3.0 standard drinks of alcohol per week. She reports that she does not use drugs. Family History:  Family History  Problem Relation Age of Onset   [name] Mother    Diabetes Mother    Arthritis Mother    Hypertension Mother    Hyperlipidemia Mother    Hypertension Father    Arthritis Father    Hearing loss Father    Kidney disease Father    Lymphoma Father        large b cell    Arthritis Sister    Hypertension Sister    Cancer Brother        ? lung due to exposure    COPD Brother    Early death Brother    COPD Son    Depression Son    Breast cancer Maternal Aunt    Breast cancer Paternal Aunt    Sleep apnea Daughter    Allergic rhinitis Neg Hx    Angioedema Neg Hx    Eczema Neg Hx    Urticaria Neg Hx    Asthma Neg Hx      HOME MEDICATIONS: Allergies as of 02/23/2023       Reactions   Other Hives   White and red sauces   Pineapple  Swelling   Shellfish Allergy Hives   Strawberry (diagnostic) Hives   Tomato Hives   cherry        Medication List        Accurate as of February 23, 2023  9:20 AM. If you have any questions, ask your nurse or doctor.          albuterol 108 (90 Base) MCG/ACT inhaler Commonly known as: VENTOLIN HFA Inhale 2 puffs into the lungs every 6 (six) hours as needed for wheezing or shortness of breath.   atorvastatin 10 MG tablet Commonly known as: LIPITOR MWF at night   B-12 PO Take by mouth.   cetirizine 10 MG tablet Commonly known as: ZyrTEC Allergy Take 1 tablet (10 mg total) by mouth at bedtime as needed.   dicyclomine 20 MG tablet Commonly known as: BENTYL TAKE ONE TABLET BY MOUTH FOUR TIMES DAILY. TAKE BEFORE MEALS AND AT BEDTIME   diphenhydrAMINE 25 mg capsule Commonly known as: BENADRYL Take 25 mg by mouth every 6 (six) hours as needed  (for allergies.).   DULoxetine 20 MG capsule Commonly known as: Cymbalta Take 1 capsule (20 mg total) by mouth 2 (two) times daily.   EPINEPHrine 0.3 mg/0.3 mL Soaj injection Commonly known as: Auvi-Q Use as directed for severe allergic reaction   famotidine 20 MG tablet Commonly known as: Pepcid Take 1 tablet (20 mg total) by mouth daily.   fluconazole 150 MG tablet Commonly known as: DIFLUCAN Take 1 tablet by mouth x 1 dose. May repeat in 72 hours if needed.   fluticasone 50 MCG/ACT nasal spray Commonly known as: FLONASE Place 2 sprays into both nostrils daily.   furosemide 20 MG tablet Commonly known as: LASIX Take 1 tablet (20 mg total) by mouth daily as needed. X 5-7 days   hydrochlorothiazide 25 MG tablet Commonly known as: HYDRODIURIL Take 1 tablet (25 mg total) by mouth daily.   hydrocortisone 2.5 % cream Apply topically 2 (two) times daily. Prn to eyelids   losartan 100 MG tablet Commonly known as: COZAAR Take 1 tablet (100 mg total) by mouth daily.   meloxicam 15 MG tablet Commonly known as: MOBIC Take 1 tablet (15 mg total) by mouth every other day as needed.   methocarbamol 750 MG tablet Commonly known as: ROBAXIN Take 1 tablet (750 mg total) by mouth every 8 (eight) hours as needed for muscle spasms.   montelukast 10 MG tablet Commonly known as: SINGULAIR Take 1 tablet (10 mg total) by mouth at bedtime.   Olopatadine HCl 0.2 % Soln Apply 1 drop to eye daily. Both eyes   pantoprazole 40 MG tablet Commonly known as: PROTONIX TAKE ONE TABLET EVERY DAY 30 MIN BEFORE DINNER   polyethylene glycol powder 17 GM/SCOOP powder Commonly known as: GLYCOLAX/MIRALAX Take 17 g by mouth 2 (two) times daily as needed.   potassium chloride SA 20 MEQ tablet Commonly known as: KLOR-CON M Take 1 tablet (20 mEq total) by mouth daily as needed. With lasix   pregabalin 75 MG capsule Commonly known as: Lyrica Take 1 capsule (75 mg total) by mouth 3 (three) times  daily.   saccharomyces boulardii 250 MG capsule Commonly known as: Florastor Take 1 capsule (250 mg total) by mouth daily.   Vitamin D-3 125 MCG (5000 UT) Tabs Take by mouth daily.          OBJECTIVE:   PHYSICAL EXAM: VS: BP (!) 136/90 (BP Location: Left Arm, Patient Position: Sitting, Cuff Size: Large)  Pulse 79   Ht 5\' 3"  (1.6 m)   Wt 231 lb (104.8 kg)   LMP  (LMP Unknown)   SpO2 99%   BMI 40.92 kg/m    EXAM: General: Pt appears well and is in NAD  Neck: General: Supple without adenopathy. Thyroid: Thyroid size normal.  No goiter or nodules appreciated.   Lungs: Clear with good BS bilat with no rales, rhonchi, or wheezes  Heart: Auscultation: RRR.  Abdomen: Normoactive bowel sounds, soft, nontender, without masses or organomegaly palpable  Extremities:  BL LE: No pretibial edema normal ROM and strength.  Mental Status: Judgment, insight: Intact Orientation: Oriented to time, place, and person Memory: Intact for recent and remote events Mood and affect: No depression, anxiety, or agitation     DATA REVIEWED:   Latest Reference Range & Units 02/19/22 09:09  Sodium 135 - 145 mEq/L 139  Potassium 3.5 - 5.1 mEq/L 3.8  Chloride 96 - 112 mEq/L 102  CO2 19 - 32 mEq/L 32  Glucose 70 - 99 mg/dL 161 (H)  BUN 6 - 23 mg/dL 18  Creatinine 0.96 - 0.45 mg/dL 4.09  Calcium 8.4 - 81.1 mg/dL 9.4  GFR >91.47 mL/min 71.70    Latest Reference Range & Units 03/18/21 15:03  Cortisol (Ur), Free 4.0 - 50.0 mcg/24 h 11.8    CT abdomen 06/19/2021  Adrenals/Urinary Tract: Low-density nodule in the left adrenal gland measures 1.3 x 1.4 cm and previously measured 1.4 x 1.2 cm. Hounsfield units are less than 10. This left adrenal nodule is compatible with a benign adenoma. Normal right adrenal gland. Normal appearance of both kidneys. No hydronephrosis.     ASSESSMENT / PLAN / RECOMMENDATIONS:   Left adrenal adenoma    -We had screened her for hyperaldosteronemia as  well as Cushing syndrome and pheo  in 2021, 2022 and cushing and hyperaldo in 2023 -Imaging  for left adrenal adenoma  shows stability x 2 (2021 and 2022)  - Will proceed with aldo , renin and cushing syndrome screening     2. HTN :   - BP elevated, she did not take her anti-hypertensive medications today nor last night.   - She does have a pill box , I have advised her to switch taking medications in the morning that way she has all day to catch up if she forgets  - Attribute this to lightheadedness - Discussed increased risk of CVA with uncontrolled HTN     Follow-up in 1 year   Signed electronically by: Lyndle Herrlich, MD  [facility] Endocrinology  [facility] [address] [place] [place], [place] [address] Phone: [phone] FAX: [phone]      CC: Dana Allan, MD [address] [place] [address] Phone: [phone]  Fax: [phone]   Return to Endocrinology clinic as below: No future appointments.

## 2023-02-23 NOTE — Patient Instructions (Signed)

## 2023-02-27 LAB — ALDOSTERONE + RENIN ACTIVITY W/ RATIO: Aldosterone: 7.5 ng/dL (ref 0.0–30.0)

## 2023-03-07 DIAGNOSIS — E785 Hyperlipidemia, unspecified: Secondary | ICD-10-CM | POA: Diagnosis not present

## 2023-03-07 DIAGNOSIS — M5440 Lumbago with sciatica, unspecified side: Secondary | ICD-10-CM | POA: Diagnosis not present

## 2023-03-07 DIAGNOSIS — M545 Low back pain, unspecified: Secondary | ICD-10-CM | POA: Diagnosis not present

## 2023-03-07 DIAGNOSIS — E875 Hyperkalemia: Secondary | ICD-10-CM | POA: Diagnosis not present

## 2023-03-07 DIAGNOSIS — M109 Gout, unspecified: Secondary | ICD-10-CM | POA: Diagnosis not present

## 2023-03-07 DIAGNOSIS — Z79899 Other long term (current) drug therapy: Secondary | ICD-10-CM | POA: Diagnosis not present

## 2023-03-07 DIAGNOSIS — G8929 Other chronic pain: Secondary | ICD-10-CM | POA: Diagnosis not present

## 2023-03-07 DIAGNOSIS — I10 Essential (primary) hypertension: Secondary | ICD-10-CM | POA: Diagnosis not present

## 2023-03-07 DIAGNOSIS — M5030 Other cervical disc degeneration, unspecified cervical region: Secondary | ICD-10-CM | POA: Diagnosis not present

## 2023-03-07 DIAGNOSIS — R7303 Prediabetes: Secondary | ICD-10-CM | POA: Diagnosis not present

## 2023-03-16 DIAGNOSIS — M654 Radial styloid tenosynovitis [de Quervain]: Secondary | ICD-10-CM | POA: Diagnosis not present

## 2023-03-19 ENCOUNTER — Encounter: Payer: Self-pay | Admitting: Student in an Organized Health Care Education/Training Program

## 2023-03-19 ENCOUNTER — Ambulatory Visit
Payer: 59 | Attending: Student in an Organized Health Care Education/Training Program | Admitting: Student in an Organized Health Care Education/Training Program

## 2023-03-19 VITALS — BP 136/77 | HR 58 | Temp 98.0°F | Resp 16 | Ht 63.0 in | Wt 238.0 lb

## 2023-03-19 DIAGNOSIS — G8929 Other chronic pain: Secondary | ICD-10-CM | POA: Insufficient documentation

## 2023-03-19 DIAGNOSIS — M5442 Lumbago with sciatica, left side: Secondary | ICD-10-CM | POA: Insufficient documentation

## 2023-03-19 DIAGNOSIS — M47816 Spondylosis without myelopathy or radiculopathy, lumbar region: Secondary | ICD-10-CM | POA: Insufficient documentation

## 2023-03-19 DIAGNOSIS — M5441 Lumbago with sciatica, right side: Secondary | ICD-10-CM | POA: Diagnosis not present

## 2023-03-19 DIAGNOSIS — M7918 Myalgia, other site: Secondary | ICD-10-CM | POA: Insufficient documentation

## 2023-03-19 DIAGNOSIS — G894 Chronic pain syndrome: Secondary | ICD-10-CM | POA: Insufficient documentation

## 2023-03-19 MED ORDER — METHOCARBAMOL 750 MG PO TABS
750.0000 mg | ORAL_TABLET | Freq: Three times a day (TID) | ORAL | 11 refills | Status: DC | PRN
Start: 2023-03-19 — End: 2024-01-12

## 2023-03-19 MED ORDER — BUPRENORPHINE 10 MCG/HR TD PTWK
1.0000 | MEDICATED_PATCH | TRANSDERMAL | 2 refills | Status: DC
Start: 2023-03-19 — End: 2023-03-31

## 2023-03-19 NOTE — Patient Instructions (Signed)
____________________________________________________________________________________________  General Risks and Possible Complications  Patient Responsibilities: It is important that you read this as it is part of your informed consent. It is our duty to inform you of the risks and possible complications associated with treatments offered to you. It is your responsibility as a patient to read this and to ask questions about anything that is not clear or that you believe was not covered in this document.  Patient's Rights: You have the right to refuse treatment. You also have the right to change your mind, even after initially having agreed to have the treatment done. However, under this last option, if you wait until the last second to change your mind, you may be charged for the materials used up to that point.  Introduction: Medicine is not an Visual merchandiser. Everything in Medicine, including the lack of treatment(s), carries the potential for danger, harm, or loss (which is by definition: Risk). In Medicine, a complication is a secondary problem, condition, or disease that can aggravate an already existing one. All treatments carry the risk of possible complications. The fact that a side effects or complications occurs, does not imply that the treatment was conducted incorrectly. It must be clearly understood that these can happen even when everything is done following the highest safety standards.  No treatment: You can choose not to proceed with the proposed treatment alternative. The "PRO(s)" would include: avoiding the risk of complications associated with the therapy. The "CON(s)" would include: not getting any of the treatment benefits. These benefits fall under one of three categories: diagnostic; therapeutic; and/or palliative. Diagnostic benefits include: getting information which can ultimately lead to improvement of the disease or symptom(s). Therapeutic benefits are those associated with  the successful treatment of the disease. Finally, palliative benefits are those related to the decrease of the primary symptoms, without necessarily curing the condition (example: decreasing the pain from a flare-up of a chronic condition, such as incurable terminal cancer).  General Risks and Complications: These are associated to most interventional treatments. They can occur alone, or in combination. They fall under one of the following six (6) categories: no benefit or worsening of symptoms; bleeding; infection; nerve damage; allergic reactions; and/or death. No benefits or worsening of symptoms: In Medicine there are no guarantees, only probabilities. No healthcare provider can ever guarantee that a medical treatment will work, they can only state the probability that it may. Furthermore, there is always the possibility that the condition may worsen, either directly, or indirectly, as a consequence of the treatment. Bleeding: This is more common if the patient is taking a blood thinner, either prescription or over the counter (example: Goody Powders, Fish oil, Aspirin, Garlic, etc.), or if suffering a condition associated with impaired coagulation (example: Hemophilia, cirrhosis of the liver, low platelet counts, etc.). However, even if you do not have one on these, it can still happen. If you have any of these conditions, or take one of these drugs, make sure to notify your treating physician. Infection: This is more common in patients with a compromised immune system, either due to disease (example: diabetes, cancer, human immunodeficiency virus [HIV], etc.), or due to medications or treatments (example: therapies used to treat cancer and rheumatological diseases). However, even if you do not have one on these, it can still happen. If you have any of these conditions, or take one of these drugs, make sure to notify your treating physician. Nerve Damage: This is more common when the treatment is an  invasive one, but it can also happen with the use of medications, such as those used in the treatment of cancer. The damage can occur to small secondary nerves, or to large primary ones, such as those in the spinal cord and brain. This damage may be temporary or permanent and it may lead to impairments that can range from temporary numbness to permanent paralysis and/or brain death. Allergic Reactions: Any time a substance or material comes in contact with our body, there is the possibility of an allergic reaction. These can range from a mild skin rash (contact dermatitis) to a severe systemic reaction (anaphylactic reaction), which can result in death. Death: In general, any medical intervention can result in death, most of the time due to an unforeseen complication. ____________________________________________________________________________________________    ______________________________________________________________________  Preparing for your procedure  Appointments: If you think you may not be able to keep your appointment, call 24-48 hours in advance to cancel. We need time to make it available to others.  During your procedure appointment there will be: No Prescription Refills. No disability issues to discussed. No medication changes or discussions.  Instructions: Food intake: Avoid eating anything solid for at least 8 hours prior to your procedure. Clear liquid intake: You may take clear liquids such as water up to 2 hours prior to your procedure. (No carbonated drinks. No soda.) Transportation: Unless otherwise stated by your physician, bring a driver. Morning Medicines: Except for blood thinners, take all of your other morning medications with a sip of water. Make sure to take your heart and blood pressure medicines. If your blood pressure's lower number is above 100, the case will be rescheduled. Blood thinners: Make sure to stop your blood thinners as instructed.  If you take  a blood thinner, but were not instructed to stop it, call our office [phone] and ask to talk to a nurse. Not stopping a blood thinner prior to certain procedures could lead to serious complications. Diabetics on insulin: Notify the staff so that you can be scheduled 1st case in the morning. If your diabetes requires high dose insulin, take only  of your normal insulin dose the morning of the procedure and notify the staff that you have done so. Preventing infections: Shower with an antibacterial soap the morning of your procedure.  Build-up your immune system: Take 1000 mg of Vitamin C with every meal (3 times a day) the day prior to your procedure. Antibiotics: Inform the nursing staff if you are taking any antibiotics or if you have any conditions that may require antibiotics prior to procedures. (Example: recent joint implants)   Pregnancy: If you are pregnant make sure to notify the nursing staff. Not doing so may result in injury to the fetus, including death.  Sickness: If you have a cold, fever, or any active infections, call and cancel or reschedule your procedure. Receiving steroids while having an infection may result in complications. Arrival: You must be in the facility at least 30 minutes prior to your scheduled procedure. Tardiness: Your scheduled time is also the cutoff time. If you do not arrive at least 15 minutes prior to your procedure, you will be rescheduled.  Children: Do not bring any children with you. Make arrangements to keep them home. Dress appropriately: There is always a possibility that your clothing may get soiled. Avoid long dresses. Valuables: Do not bring any jewelry or valuables.  Reasons to call and reschedule or cancel your procedure: (Following these recommendations will minimize the risk  of a serious complication.) Surgeries: Avoid having procedures within 2 weeks of any surgery. (Avoid for 2 weeks before or after any surgery). Flu Shots: Avoid having  procedures within 2 weeks of a flu shots or . (Avoid for 2 weeks before or after immunizations). Barium: Avoid having a procedure within 7-10 days after having had a radiological study involving the use of radiological contrast. (Myelograms, Barium swallow or enema study). Heart attacks: Avoid any elective procedures or surgeries for the initial 6 months after a "Myocardial Infarction" (Heart Attack). Blood thinners: It is imperative that you stop these medications before procedures. Let us know if you if you take any blood thinner.  Infection: Avoid procedures during or within two weeks of an infection (including chest colds or gastrointestinal problems). Symptoms associated with infections include: Localized redness, fever, chills, night sweats or profuse sweating, burning sensation when voiding, cough, congestion, stuffiness, runny nose, sore throat, diarrhea, nausea, vomiting, cold or Flu symptoms, recent or current infections. It is specially important if the infection is over the area that we intend to treat. Heart and lung problems: Symptoms that may suggest an active cardiopulmonary problem include: cough, chest pain, breathing difficulties or shortness of breath, dizziness, ankle swelling, uncontrolled high or unusually low blood pressure, and/or palpitations. If you are experiencing any of these symptoms, cancel your procedure and contact your primary care physician for an evaluation.  Remember:  Regular Business hours are:  Monday to Thursday 8:00 AM to 4:00 PM  Provider's Schedule: Delano Metz, MD:  Procedure days: Tuesday and Thursday 7:30 AM to 4:00 PM  Edward Jolly, MD:  Procedure days: Monday and Wednesday 7:30 AM to 4:00 PM  ______________________________________________________________________   Radiofrequency Ablation Radiofrequency ablation is a procedure that is performed to relieve pain. The procedure is often used for back, neck, or arm pain. Radiofrequency ablation  involves the use of a machine that creates radio waves to make heat. During the procedure, the heat is applied to the nerve that carries the pain signal. The heat damages the nerve and interferes with the pain signal. Pain relief usually starts about 2 weeks after the procedure and lasts for 6 months to 1 year. Tell a health care provider about: Any allergies you have. All medicines you are taking, including vitamins, herbs, eye drops, creams, and over-the-counter medicines. Any problems you or family members have had with anesthetic medicines. Any bleeding problems you have. Any surgeries you have had. Any medical conditions you have. Whether you are pregnant or may be pregnant. What are the risks? Generally, this is a safe procedure. However, problems may occur, including: Pain or soreness at the injection site. Allergic reaction to medicines given during the procedure. Bleeding. Infection at the injection site. Damage to nerves or blood vessels. What happens before the procedure? When to stop eating and drinking Follow instructions from your health care provider about what you may eat and drink before your procedure. These may include: 8 hours before the procedure Stop eating most foods. Do not eat meat, fried foods, or fatty foods. Eat only light foods, such as toast or crackers. All liquids are okay except energy drinks and alcohol. 6 hours before the procedure Stop eating. Drink only clear liquids, such as water, clear fruit juice, black coffee, plain tea, and sports drinks. Do not drink energy drinks or alcohol. 2 hours before the procedure Stop drinking all liquids. You may be allowed to take medicine with small sips of water. If you do not follow your health  care provider's instructions, your procedure may be delayed or canceled. Medicines Ask your health care provider about: Changing or stopping your regular medicines. This is especially important if you are taking diabetes  medicines or blood thinners. Taking medicines such as aspirin and ibuprofen. These medicines can thin your blood. Do not take these medicines unless your health care provider tells you to take them. Taking over-the-counter medicines, vitamins, herbs, and supplements. General instructions Ask your health care provider what steps will be taken to help prevent infection. These steps may include: Removing hair at the procedure site. Washing skin with a germ-killing soap. Taking antibiotic medicine. If you will be going home right after the procedure, plan to have a responsible adult: Take you home from the hospital or clinic. You will not be allowed to drive. Care for you for the time you are told. What happens during the procedure?  You will be awake during the procedure. You will need to be able to talk with the health care provider during the procedure. An IV will be inserted into one of your veins. You will be given one or more of the following: A medicine to help you relax (sedative). A medicine to numb the area (local anesthetic). Your health care provider will insert a radiofrequency needle into the area to be treated. This is done with the help of fluoroscopy. A wire that carries the radio waves (electrode) will be put through the radiofrequency needle. An electrical pulse will be sent through the electrode to verify the correct nerve that is causing your pain. You will feel a tingling sensation, and you may have muscle twitching. The tissue around the needle tip will be heated by an electric current that comes from the radiofrequency machine. This will numb the nerves. The needle will be removed. A bandage (dressing) will be put on the insertion area. The procedure may vary among health care providers and hospitals. What happens after the procedure? Your blood pressure, heart rate, breathing rate, and blood oxygen level will be monitored until you leave the hospital or clinic. Return  to your normal activities as told by your health care provider. Ask your health care provider what activities are safe for you. If you were given a sedative during the procedure, it can affect you for several hours. Do not drive or operate machinery until your health care provider says that it is safe. Summary Radiofrequency ablation is a procedure that is performed to relieve pain. The procedure is often used for back, neck, or arm pain. Radiofrequency ablation involves the use of a machine that creates radio waves to make heat. Plan to have a responsible adult take you home from the hospital or clinic. Do not drive or operate machinery until your health care provider says that it is safe. Return to your normal activities as told by your health care provider. Ask your health care provider what activities are safe for you. This information is not intended to replace advice given to you by your health care provider. Make sure you discuss any questions you have with your health care provider. Document Revised: 02/05/2021 Document Reviewed: 02/05/2021 [employer] Patient Education  2024 [employer].

## 2023-03-19 NOTE — Progress Notes (Signed)
PROVIDER NOTE: Information contained herein reflects review and annotations entered in association with encounter. Interpretation of such information and data should be left to medically-trained personnel. Information provided to patient can be located elsewhere in the medical record under "Patient Instructions". Document created using STT-dictation technology, any transcriptional errors that may result from process are unintentional.    Patient: Anita Gibbs  Service Category: E/M  Provider: Edward Jolly, MD  DOB: 26-Mar-1971  DOS: 03/19/2023  Referring Provider: Dana Allan, MD  MRN: [id number]  Specialty: Interventional Pain Management  PCP: Dana Allan, MD  Type: Established Patient  Setting: Ambulatory outpatient    Location: Office  Delivery: Face-to-face     HPI  [name], a 52 y.o. year old female, is here today because of her Lumbar facet arthropathy [M47.816]. Ms. Koebel primary complain today is Back Pain (lower)  Pertinent problems: Ms. Linenberger has Morbid obesity with BMI of 45.0-49.9, adult [facility]) on their pertinent problem list. Pain Assessment: Severity of   is reported as a 7 /10. Location: Back Left, Lower/down side of leg to lower leg. Onset: More than a month ago. Quality: Aching, Numbness, Discomfort. Timing: Constant. Modifying factor(s): rest. Vitals:  height is 5\' 3"  (1.6 m) and weight is 238 lb (108 kg). Her temperature is 98 F (36.7 C). Her blood pressure is 136/77 and her pulse is 58 (abnormal). Her respiration is 16 and oxygen saturation is 100%.  BMI: Estimated body mass index is 42.16 kg/m as calculated from the following:   Height as of this encounter: 5\' 3"  (1.6 m).   Weight as of this encounter: 238 lb (108 kg). Last encounter: 08/18/2022. Last procedure: 10/29/2022.  Reason for encounter: evaluation for possible interventional PM therapy/treatment.   Patient presents with increased low back pain that is worse on the left side.  This is related to  lumbar facet arthropathy.  Her previous left L3, L4, L5 RFA was on 10/06/2022 that provided her with 70% pain relief for over 3-1/2 months.  She is experiencing return of her pain.  In fact she went to the emergency department on 03/07/2023 for increased low back pain.  She states that she is out of her Butrans patch.  She is currently in a higher level of pain.  We discussed increasing her patch temporarily from 7.5 to 10 mcg an hour.  Risk and benefits of this were reviewed and patient would like to trial that.  Pharmacotherapy Assessment  Analgesic: Butrans patch 7.5 mcg/hr to 10 mcg/hr  Monitoring: [place] PMP: PDMP reviewed during this encounter.       Pharmacotherapy: No side-effects or adverse reactions reported. Compliance: No problems identified. Effectiveness: Clinically acceptable.  Newman Pies, RN  03/19/2023  9:02 AM  Sign when Signing Visit Safety precautions to be maintained throughout the outpatient stay will include: orient to surroundings, keep bed in low position, maintain call bell within reach at all times, provide assistance with transfer out of bed and ambulation.   Did not bring medications with her today. States she has been out for a month. Patient went to the ED for pain. Patient received Toradol injection, Valium 5mg  10 pills total. Motrin and  OTC Lidocaine patch.  No results found for: "CBDTHCR" No results found for: "D8THCCBX" No results found for: "D9THCCBX"  UDS:  Summary  Date Value Ref Range Status  02/01/2020 Note  Final    Comment:    ==================================================================== [employer] Select 13 (MW) ==================================================================== Test  Result       Flag       Units Drug Present not Declared for Prescription Verification   Carboxy-THC                    20           UNEXPECTED ng/mg creat    Carboxy-THC is a metabolite of tetrahydrocannabinol (THC). Source of     THC is most commonly herbal marijuana or marijuana-based products,    but THC is also present in a scheduled prescription medication.    Trace amounts of THC can be present in hemp and cannabidiol (CBD)    products. This test is not intended to distinguish between delta-9-    tetrahydrocannabinol, the predominant form of THC in most herbal or    marijuana-based products, and delta-8-tetrahydrocannabinol. Drug Absent but Declared for Prescription Verification   Oxycodone                      Not Detected UNEXPECTED ng/mg creat ==================================================================== Test                      Result    Flag   Units      Ref Range   Creatinine              230              mg/dL      >=32 ==================================================================== Declared Medications:  The flagging and interpretation on this report are based on the  following declared medications.  Unexpected results may arise from  inaccuracies in the declared medications.  **Note: The testing scope of this panel includes these medications:  Oxycodone (Roxicodone)  **Note: The testing scope of this panel does not include the  following reported medications:  Albuterol (Ventolin HFA)  Amlodipine (Norvasc)  Atorvastatin (Lipitor)  Cetirizine  Diphenhydramine (Benadryl)  Epinephrine (EpiPen)  Fluticasone (Flonase)  Gabapentin (Neurontin)  Hydrochlorothiazide  Losartan (Cozaar)  Meloxicam (Mobic)  Methocarbamol (Robaxin)  Montelukast (Singulair)  Norethindrone (Aygestin)  Pantoprazole (Protonix)  Vitamin D3 ==================================================================== For clinical consultation, please call [phone]. ====================================================================       ROS  Constitutional: Denies any fever or chills Gastrointestinal: No reported hemesis, hematochezia, vomiting, or acute GI distress Musculoskeletal:  Left low back  pain Neurological: No reported episodes of acute onset apraxia, aphasia, dysarthria, agnosia, amnesia, paralysis, loss of coordination, or loss of consciousness  Medication Review  Cyanocobalamin, DULoxetine, EPINEPHrine, Olopatadine HCl, Vitamin D-3, albuterol, atorvastatin, buprenorphine, cetirizine, diazepam, dicyclomine, diphenhydrAMINE, famotidine, fluconazole, fluticasone, furosemide, hydrochlorothiazide, hydrocortisone, lidocaine, losartan, meloxicam, methocarbamol, montelukast, pantoprazole, polyethylene glycol powder, potassium chloride SA, pregabalin, and saccharomyces boulardii  History Review  Allergy: Ms. Manzer is allergic to other, pineapple, shellfish allergy, strawberry (diagnostic), and tomato. Drug: Ms. Waltermire  reports no history of drug use. Alcohol:  reports current alcohol use of about 3.0 standard drinks of alcohol per week. Tobacco:  reports that she has never smoked. She has never used smokeless tobacco. Social: Ms. Petrucci  reports that she has never smoked. She has never used smokeless tobacco. She reports current alcohol use of about 3.0 standard drinks of alcohol per week. She reports that she does not use drugs. 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), Adrenal nodule (HCC) (06/20/2020), ANA positive (02/01/2021),  Anxiety and depression (01/29/2021), Arthritis, Back pain with history of spinal surgery (01/29/2021), Bilateral carpal tunnel syndrome (01/29/2021), Bilateral leg edema (01/29/2021), BMI 40.0-44.9, adult (HCC) (04/30/2022), Carpal boss, right, Carpal tunnel syndrome, bilateral, 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) (09/27/2018), Cervical facet hypertrophy (C3-T1) (09/27/2018), Cervical facet joint syndrome (Bilateral) (L>R)  (09/27/2018), 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-5, C5-6, and C6-7) (09/27/2018), Cervical myelopathy (HCC) (07/01/2017), Cervicalgia (Primary Area of Pain) (Bilateral) (L>R) (09/27/2018), Chronic gout of foot (Left) (05/31/2018), Chronic low back pain (Tertiary Area of Pain) (Bilateral) (R>L) w/ sciatica (Bilateral) (09/07/2018), Chronic low back pain (Tertiary Area of Pain) (Bilateral) (R>L) w/o sciatica (07/26/2018), Chronic lower extremity pain (Fourth Area of Pain) (Bilateral) (R>L) (09/07/2018), Chronic musculoskeletal pain (10/27/2018), Chronic neck pain (Bilateral) w/ history of cervical spinal surgery (09/27/2018), Chronic neck pain (Primary Area of Pain) (Bilateral) (L>R) (09/07/2018), Chronic sacroiliac joint dysfunction (Bilateral) (09/27/2018), Chronic sacroiliac joint pain (Right) (09/07/2018), Chronic upper extremity pain (Secondary Area of Pain) (Bilateral) (R>L) (09/27/2018), Disorder of skeletal system (09/07/2018), E. coli UTI (urinary tract infection) (06/20/2020), Elevated sed rate (09/08/2018), Elevated serum creatinine (06/20/2020), Excessive daytime sleepiness (03/06/2020), Female pelvic pain (09/05/2020), Headache, History of allergy to shellfish (09/28/2018), History of fusion of cervical spine (ACDF C4-C7) (09/27/2018), History of illicit drug use (03/07/2020), Hives (09/05/2020), Hives (09/05/2020), Hyperkalemia (05/31/2018), Hyperlipidemia (04/30/2022), Hypertension, Large breasts (05/30/2020), Left hip pain (07/21/2022), Lumbar facet arthropathy (04/20/2019), Lumbar radiculitis (L5 dermatome) (Right) (04/17/2020), Multiple environmental allergies (01/17/2020), Multiple food allergies (03/07/2020), Neurogenic pain (10/27/2018), Numbness and tingling of both feet (05/31/2018), 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), Other intervertebral disc degeneration, 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), Pharmacologic therapy (09/07/2018), Plantar fasciitis, bilateral  (03/04/2021), Polyneuropathy (01/29/2021), Prediabetes (10/10/2019), Problems influencing health status (09/07/2018), Rash (08/08/2019), Seasonal allergic rhinitis due to pollen (11/29/2018), Seasonal allergies (03/06/2020), Seasonal asthma (11/29/2018), Somatic dysfunction of sacroiliac joint (Bilateral) (09/27/2018), Spondylosis without myelopathy or radiculopathy, cervical region (03/22/2019), Spondylosis without myelopathy or radiculopathy, lumbosacral region (05/19/2019), Spondylosis, cervical, 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 Surgical: Ms. Sliva  has a past surgical history that includes Knee arthroscopy (Right); Anterior cervical decomp/discectomy fusion (N/A, 07/01/2017); Abdominal hysterectomy (10/17/2020); and Carpal tunnel release (Bilateral). Family: family history includes [name] in her mother; Arthritis in her father, mother, and sister; Breast cancer in her maternal aunt and paternal aunt; COPD in her brother and son; Cancer in her brother; Depression in her son; Diabetes in her mother; Early death in her brother; Hearing loss in her father; Hyperlipidemia in her mother; Hypertension in her father, mother, and sister; Kidney disease in her father; Lymphoma in her father; Sleep apnea in her daughter.  Laboratory Chemistry Profile   Renal Lab Results  Component Value Date   BUN 18 06/30/2022   CREATININE 0.90 06/30/2022   BCR NOT APPLICABLE 02/13/2021   GFR 85.51 06/30/2022   GFRAA 76 09/07/2018   GFRNONAA 66 09/07/2018    Hepatic Lab Results  Component Value Date   AST 13 06/30/2022   ALT 13 06/30/2022   ALBUMIN 4.1 06/30/2022   ALKPHOS 65 06/30/2022   LIPASE 32.0 06/30/2022    Electrolytes Lab Results  Component Value Date   NA 137 06/30/2022   K 3.6 02/23/2023   CL 101 06/30/2022   CALCIUM 9.5 06/30/2022   MG 2.2 09/07/2018    Bone Lab Results  Component Value Date   VD25OH 54.97 01/24/2021   25OHVITD1 8.1 (L) 09/07/2018    25OHVITD2 <1.0 09/07/2018   25OHVITD3 8.1 09/07/2018    Inflammation (CRP: Acute Phase) (ESR: Chronic Phase)  Lab Results  Component Value Date   CRP <1.0 01/24/2021   ESRSEDRATE 37 (H) 01/24/2021         Note: Above Lab results reviewed.  Recent Imaging Review  MM 3D SCREEN BREAST BILATERAL CLINICAL DATA:  Screening.  EXAM: DIGITAL SCREENING BILATERAL MAMMOGRAM WITH TOMOSYNTHESIS AND CAD  TECHNIQUE: Bilateral screening digital craniocaudal and mediolateral oblique mammograms were obtained. Bilateral screening digital breast tomosynthesis was performed. The images were evaluated with computer-aided detection.  COMPARISON:  Previous exam(s).  ACR Breast Density Category b: There are scattered areas of fibroglandular density.  FINDINGS: There are no findings suspicious for malignancy.  IMPRESSION: No mammographic evidence of malignancy. A result letter of this screening mammogram will be mailed directly to the patient.  RECOMMENDATION: Screening mammogram in one year. (Code:SM-B-01Y)  BI-RADS CATEGORY  1: Negative.  Electronically Signed   By: Edwin Cap M.D.   On: 11/18/2022 12:49 Note: Reviewed        Physical Exam  General appearance: Well nourished, well developed, and well hydrated. In no apparent acute distress Mental status: Alert, oriented x 3 (person, place, & time)       Respiratory: No evidence of acute respiratory distress Eyes: PERLA Vitals: BP 136/77   Pulse (!) 58   Temp 98 F (36.7 C)   Resp 16   Ht 5\' 3"  (1.6 m)   Wt 238 lb (108 kg)   LMP  (LMP Unknown)   SpO2 100%   BMI 42.16 kg/m  BMI: Estimated body mass index is 42.16 kg/m as calculated from the following:   Height as of this encounter: 5\' 3"  (1.6 m).   Weight as of this encounter: 238 lb (108 kg). Ideal: Ideal body weight: 52.4 kg (115 lb 8.3 oz) Adjusted ideal body weight: 74.6 kg (164 lb 8.2 oz)  Lumbar Spine Area Exam  Skin & Axial Inspection: No masses, redness, or  swelling Alignment: Symmetrical Functional ROM: Pain restricted ROM affecting primarily the left Stability: No instability detected Muscle Tone/Strength: Functionally intact. No obvious neuro-muscular anomalies detected. Sensory (Neurological): Musculoskeletal pain pattern, facet mediated  Palpation: Complains of area being tender to palpation Left Fist Percussion Test Provocative Tests: Hyperextension/rotation test: (+) on the left for facet joint pain. Lumbar quadrant test (Kemp's test): (+) on the left for facet joint pain.  Gait & Posture Assessment  Ambulation: Unassisted Gait: Relatively normal for age and body habitus Posture: WNL  Lower Extremity Exam    Side: Right lower extremity  Side: Left lower extremity  Stability: No instability observed          Stability: No instability observed          Skin & Extremity Inspection: Skin color, temperature, and hair growth are WNL. No peripheral edema or cyanosis. No masses, redness, swelling, asymmetry, or associated skin lesions. No contractures.  Skin & Extremity Inspection: Skin color, temperature, and hair growth are WNL. No peripheral edema or cyanosis. No masses, redness, swelling, asymmetry, or associated skin lesions. No contractures.  Functional ROM: Unrestricted ROM                  Functional ROM: Unrestricted ROM                  Muscle Tone/Strength: Functionally intact. No obvious neuro-muscular anomalies detected.  Muscle Tone/Strength: Functionally intact. No obvious neuro-muscular anomalies detected.  Sensory (Neurological): Unimpaired        Sensory (Neurological): Unimpaired        DTR: Patellar:  deferred today Achilles: deferred today Plantar: deferred today  DTR: Patellar: deferred today Achilles: deferred today Plantar: deferred today  Palpation: No palpable anomalies  Palpation: No palpable anomalies    Assessment   Diagnosis Status  1. Lumbar facet arthropathy   2. Lumbar facet syndrome (Bilateral) (R>L)    3. Chronic low back pain [facility] Area of Pain) (Bilateral) (R>L) w/ sciatica (Bilateral)   4. Chronic pain syndrome   5. Chronic musculoskeletal pain    Having a Flare-up Having a Flare-up Having a Flare-up    Plan of Care    [name] has a current medication list which includes the following long-term medication(s): albuterol, atorvastatin, cetirizine, dicyclomine, diphenhydramine, famotidine, fluticasone, furosemide, hydrochlorothiazide, losartan, montelukast, pantoprazole, potassium chloride sa, pregabalin, duloxetine, and methocarbamol.  Pharmacotherapy (Medications Ordered): Meds ordered this encounter  Medications   buprenorphine (BUTRANS) 10 MCG/HR PTWK    Sig: Place 1 patch onto the skin once a week.    Dispense:  4 patch    Refill:  2    Chronic Pain: STOP Act (Not applicable) Fill 1 day early if closed on refill date. Avoid benzodiazepines within 8 hours of opioids   methocarbamol (ROBAXIN) 750 MG tablet    Sig: Take 1 tablet (750 mg total) by mouth every 8 (eight) hours as needed for muscle spasms.    Dispense:  90 tablet    Refill:  11    Fill one day early if pharmacy is closed on scheduled refill date. May substitute for generic if available.   Orders:  Orders Placed This Encounter  Procedures   Radiofrequency,Lumbar    Standing Status:   Future    Standing Expiration Date:   06/19/2023    Scheduling Instructions:     Side(s): LEFT     Level(s): L3, L4, L5,  Medial Branch Nerve(s)     Sedation: With Sedation, ECT     Scheduling Timeframe: 4 weeks    Order Specific Question:   Where will this procedure be performed?    Answer:   [facility]   Follow-up plan:   Return in about 4 weeks (around 04/16/2023) for Left L3, 4, 5 RFA, ECT.      Recent Visits No visits were found meeting these conditions. Showing recent visits within past 90 days and meeting all other requirements Today's Visits Date Type Provider Dept  03/19/23 Office  Visit Edward Jolly, MD [facility]  Showing today's visits and meeting all other requirements Future Appointments Date Type Provider Dept  06/11/23 Appointment Edward Jolly, MD [facility]  Showing future appointments within next 90 days and meeting all other requirements  I discussed the assessment and treatment plan with the patient. The patient was provided an opportunity to ask questions and all were answered. The patient agreed with the plan and demonstrated an understanding of the instructions.  Patient advised to call back or seek an in-person evaluation if the symptoms or condition worsens.  Duration of encounter: 30 minutes.  Total time on encounter, as per AMA guidelines included both the face-to-face and non-face-to-face time personally spent by the physician and/or other qualified health care professional(s) on the day of the encounter (includes time in activities that require the physician or other qualified health care professional and does not include time in activities normally performed by clinical staff). Physician's time may include the following activities when performed: Preparing to see the patient (e.g., pre-charting review of records, searching for previously ordered imaging, lab  work, and nerve conduction tests) Review of prior analgesic pharmacotherapies. Reviewing PMP Interpreting ordered tests (e.g., lab work, imaging, nerve conduction tests) Performing post-procedure evaluations, including interpretation of diagnostic procedures Obtaining and/or reviewing separately obtained history Performing a medically appropriate examination and/or evaluation Counseling and educating the patient/family/caregiver Ordering medications, tests, or procedures Referring and communicating with other health care professionals (when not separately reported) Documenting clinical information in the electronic or other health record Independently interpreting results  (not separately reported) and communicating results to the patient/ family/caregiver Care coordination (not separately reported)  Note by: Edward Jolly, MD Date: 03/19/2023; Time: 9:54 AM

## 2023-03-19 NOTE — Progress Notes (Signed)
Safety precautions to be maintained throughout the outpatient stay will include: orient to surroundings, keep bed in low position, maintain call bell within reach at all times, provide assistance with transfer out of bed and ambulation.   Did not bring medications with her today. States she has been out for a month. Patient went to the ED for pain. Patient received Toradol injection, Valium 5mg  10 pills total. Motrin and  OTC Lidocaine patch.

## 2023-03-23 ENCOUNTER — Telehealth: Payer: Self-pay | Admitting: Student in an Organized Health Care Education/Training Program

## 2023-03-23 NOTE — Telephone Encounter (Signed)
Needs PA for pain patches due to increased dose.

## 2023-03-23 NOTE — Telephone Encounter (Signed)
PA completed.

## 2023-03-31 ENCOUNTER — Telehealth: Payer: Self-pay | Admitting: Student in an Organized Health Care Education/Training Program

## 2023-03-31 ENCOUNTER — Other Ambulatory Visit: Payer: Self-pay | Admitting: *Deleted

## 2023-03-31 DIAGNOSIS — G894 Chronic pain syndrome: Secondary | ICD-10-CM

## 2023-03-31 MED ORDER — BUPRENORPHINE 10 MCG/HR TD PTWK
1.0000 | MEDICATED_PATCH | TRANSDERMAL | 2 refills | Status: DC
Start: 2023-03-31 — End: 2023-06-11

## 2023-03-31 NOTE — Telephone Encounter (Signed)
Patient is asking to have Butrans 10 mg script sent to [facility]. Her regular pharmacy does not have the 10mg 

## 2023-03-31 NOTE — Telephone Encounter (Signed)
Patient notified

## 2023-03-31 NOTE — Telephone Encounter (Signed)
Rx request sent to Dr. Lateef 

## 2023-04-07 ENCOUNTER — Telehealth: Payer: Self-pay

## 2023-04-07 NOTE — Telephone Encounter (Signed)
Her insurance company denied due to it not being 6 months since her previous RFA on the left side. Her previous RFA was 10/06/2022. The request for this RFA was for after 04/08/23. We are looking at mid August for the Left side RFA to be done. This is over 6 months since her previous left sided RFA.

## 2023-04-08 ENCOUNTER — Ambulatory Visit (INDEPENDENT_AMBULATORY_CARE_PROVIDER_SITE_OTHER): Payer: 59 | Admitting: Family Medicine

## 2023-04-08 ENCOUNTER — Encounter: Payer: Self-pay | Admitting: Family Medicine

## 2023-04-08 VITALS — BP 120/80 | HR 62 | Temp 98.0°F | Resp 16 | Ht 63.0 in | Wt 244.5 lb

## 2023-04-08 DIAGNOSIS — R600 Localized edema: Secondary | ICD-10-CM

## 2023-04-08 DIAGNOSIS — D649 Anemia, unspecified: Secondary | ICD-10-CM

## 2023-04-08 DIAGNOSIS — Z0000 Encounter for general adult medical examination without abnormal findings: Secondary | ICD-10-CM | POA: Diagnosis not present

## 2023-04-08 DIAGNOSIS — E785 Hyperlipidemia, unspecified: Secondary | ICD-10-CM

## 2023-04-08 DIAGNOSIS — G629 Polyneuropathy, unspecified: Secondary | ICD-10-CM | POA: Diagnosis not present

## 2023-04-08 DIAGNOSIS — R059 Cough, unspecified: Secondary | ICD-10-CM

## 2023-04-08 DIAGNOSIS — R7309 Other abnormal glucose: Secondary | ICD-10-CM | POA: Diagnosis not present

## 2023-04-08 DIAGNOSIS — K219 Gastro-esophageal reflux disease without esophagitis: Secondary | ICD-10-CM | POA: Diagnosis not present

## 2023-04-08 DIAGNOSIS — J309 Allergic rhinitis, unspecified: Secondary | ICD-10-CM | POA: Diagnosis not present

## 2023-04-08 DIAGNOSIS — H1033 Unspecified acute conjunctivitis, bilateral: Secondary | ICD-10-CM

## 2023-04-08 DIAGNOSIS — I10 Essential (primary) hypertension: Secondary | ICD-10-CM | POA: Diagnosis not present

## 2023-04-08 DIAGNOSIS — M5417 Radiculopathy, lumbosacral region: Secondary | ICD-10-CM | POA: Diagnosis not present

## 2023-04-08 DIAGNOSIS — J45998 Other asthma: Secondary | ICD-10-CM

## 2023-04-08 DIAGNOSIS — F39 Unspecified mood [affective] disorder: Secondary | ICD-10-CM

## 2023-04-08 DIAGNOSIS — J321 Chronic frontal sinusitis: Secondary | ICD-10-CM | POA: Diagnosis not present

## 2023-04-08 DIAGNOSIS — F32A Anxiety disorder, unspecified: Secondary | ICD-10-CM

## 2023-04-08 DIAGNOSIS — J302 Other seasonal allergic rhinitis: Secondary | ICD-10-CM

## 2023-04-08 DIAGNOSIS — H01119 Allergic dermatitis of unspecified eye, unspecified eyelid: Secondary | ICD-10-CM

## 2023-04-08 DIAGNOSIS — M25552 Pain in left hip: Secondary | ICD-10-CM

## 2023-04-08 LAB — VITAMIN B12: Vitamin B-12: 600 pg/mL (ref 211–911)

## 2023-04-08 LAB — CBC
HCT: 36.7 % (ref 36.0–46.0)
Hemoglobin: 11.8 g/dL — ABNORMAL LOW (ref 12.0–15.0)
MCHC: 32.2 g/dL (ref 30.0–36.0)
MCV: 100.3 fl — ABNORMAL HIGH (ref 78.0–100.0)
Platelets: 280 10*3/uL (ref 150.0–400.0)
RBC: 3.66 Mil/uL — ABNORMAL LOW (ref 3.87–5.11)
RDW: 15 % (ref 11.5–15.5)
WBC: 5.2 10*3/uL (ref 4.0–10.5)

## 2023-04-08 LAB — LIPID PANEL
Cholesterol: 246 mg/dL — ABNORMAL HIGH (ref 0–200)
HDL: 64.6 mg/dL (ref >39.00)
LDL Cholesterol: 159 mg/dL — ABNORMAL HIGH (ref 0–99)
NonHDL: 181.23
Total CHOL/HDL Ratio: 4
Triglycerides: 113 mg/dL (ref 0.0–149.0)
VLDL: 22.6 mg/dL (ref 0.0–40.0)

## 2023-04-08 LAB — COMPREHENSIVE METABOLIC PANEL
ALT: 12 U/L (ref 0–35)
AST: 13 U/L (ref 0–37)
Albumin: 4.1 g/dL (ref 3.5–5.2)
Alkaline Phosphatase: 60 U/L (ref 39–117)
BUN: 11 mg/dL (ref 6–23)
CO2: 32 mEq/L (ref 19–32)
Calcium: 9.5 mg/dL (ref 8.4–10.5)
Chloride: 101 mEq/L (ref 96–112)
Creatinine, Ser: 0.86 mg/dL (ref 0.40–1.20)
GFR: 79.15 mL/min (ref >60.00)
Glucose, Bld: 103 mg/dL — ABNORMAL HIGH (ref 70–99)
Potassium: 3.9 mEq/L (ref 3.5–5.1)
Sodium: 139 mEq/L (ref 135–145)
Total Bilirubin: 0.6 mg/dL (ref 0.2–1.2)
Total Protein: 7.3 g/dL (ref 6.0–8.3)

## 2023-04-08 LAB — VITAMIN D 25 HYDROXY (VIT D DEFICIENCY, FRACTURES): VITD: 29.35 ng/mL — ABNORMAL LOW (ref 30.00–100.00)

## 2023-04-08 LAB — HEMOGLOBIN A1C: Hgb A1c MFr Bld: 5.8 % (ref 4.6–6.5)

## 2023-04-08 LAB — TSH: TSH: 1.43 u[IU]/mL (ref 0.35–5.50)

## 2023-04-08 MED ORDER — PANTOPRAZOLE SODIUM 40 MG PO TBEC
DELAYED_RELEASE_TABLET | ORAL | 3 refills | Status: DC
Start: 2023-04-08 — End: 2024-05-06

## 2023-04-08 MED ORDER — MONTELUKAST SODIUM 10 MG PO TABS: 10.0000 mg | ORAL_TABLET | Freq: Every day | ORAL | 3 refills | Status: AC

## 2023-04-08 MED ORDER — HYDROCORTISONE 2.5 % EX CREA
TOPICAL_CREAM | Freq: Two times a day (BID) | CUTANEOUS | 0 refills | Status: AC
Start: 2023-04-08 — End: ?

## 2023-04-08 MED ORDER — POTASSIUM CHLORIDE CRYS ER 20 MEQ PO TBCR
20.0000 meq | EXTENDED_RELEASE_TABLET | Freq: Every day | ORAL | 11 refills | Status: DC | PRN
Start: 2023-04-08 — End: 2024-06-09

## 2023-04-08 MED ORDER — FLUTICASONE PROPIONATE 50 MCG/ACT NA SUSP: 2.0000 | Freq: Every day | NASAL | 6 refills | Status: DC

## 2023-04-08 MED ORDER — FAMOTIDINE 20 MG PO TABS: 20.0000 mg | ORAL_TABLET | Freq: Every day | ORAL | 3 refills | Status: DC

## 2023-04-08 MED ORDER — LOSARTAN POTASSIUM 100 MG PO TABS
100.0000 mg | ORAL_TABLET | Freq: Every day | ORAL | 3 refills | Status: DC
Start: 2023-04-08 — End: 2024-06-06

## 2023-04-08 MED ORDER — SACCHAROMYCES BOULARDII 250 MG PO CAPS
250.0000 mg | ORAL_CAPSULE | Freq: Every day | ORAL | 0 refills | Status: AC
Start: 2023-04-08 — End: ?

## 2023-04-08 MED ORDER — ALBUTEROL SULFATE HFA 108 (90 BASE) MCG/ACT IN AERS
2.0000 | INHALATION_SPRAY | Freq: Four times a day (QID) | RESPIRATORY_TRACT | 11 refills | Status: DC | PRN
Start: 2023-04-08 — End: 2023-10-23

## 2023-04-08 MED ORDER — CETIRIZINE HCL 10 MG PO TABS
10.0000 mg | ORAL_TABLET | Freq: Every evening | ORAL | 3 refills | Status: AC | PRN
Start: 2023-04-08 — End: ?

## 2023-04-08 MED ORDER — DULOXETINE HCL 20 MG PO CPEP
20.0000 mg | ORAL_CAPSULE | Freq: Two times a day (BID) | ORAL | 3 refills | Status: DC
Start: 2023-04-08 — End: 2023-07-22

## 2023-04-08 MED ORDER — FUROSEMIDE 20 MG PO TABS
20.0000 mg | ORAL_TABLET | Freq: Every day | ORAL | 11 refills | Status: DC | PRN
Start: 2023-04-08 — End: 2023-04-24

## 2023-04-08 MED ORDER — OLOPATADINE HCL 0.2 % OP SOLN
1.0000 [drp] | Freq: Every day | OPHTHALMIC | 11 refills | Status: DC
Start: 2023-04-08 — End: 2023-07-22

## 2023-04-08 MED ORDER — HYDROCHLOROTHIAZIDE 25 MG PO TABS
25.0000 mg | ORAL_TABLET | Freq: Every day | ORAL | Status: DC
Start: 2023-04-08 — End: 2023-06-10

## 2023-04-08 MED ORDER — ATORVASTATIN CALCIUM 10 MG PO TABS
ORAL_TABLET | ORAL | 3 refills | Status: DC
Start: 2023-04-08 — End: 2024-05-06

## 2023-04-08 NOTE — Progress Notes (Signed)
SUBJECTIVE:   Chief Complaint  Patient presents with   Annual Exam   HPI Presents to clinic for physical exam  Requesting referral to Neurosurgery at [facility].  Dr Montez Morita or Dr Jeri Modena for Left side sciatica.  Following with pain management, Dr Cherylann Ratel and having steroid injections.  Reports this has not been helpful.  Currently taking Lyrica 75 mg daily, Robaxin 750 mg q8h, Cymbalta 20 mg daily and Buprenorphine 10 mcg patch weekly.     HTN Asymptomatic.  Compliant with current medication.  Recently discontinued Amlodipine for lower extremity edema.  This has now improved.   HLD On Crestor 10 mg MWF  Mood disorder Requesting refill for Duloxetine 20 mg daily.  Denies SI/HI    PERTINENT PMH / PSH: HTN HLD DDD Back pain Asthma   OBJECTIVE:  BP 120/80 (BP Location: Right Arm, Patient Position: Sitting, Cuff Size: Large)   Pulse 62   Temp 98 F (36.7 C)   Resp 16   Ht 5\' 3"  (1.6 m)   Wt 244 lb 8 oz (110.9 kg)   LMP  (LMP Unknown)   SpO2 98%   BMI 43.31 kg/m    Physical Exam Vitals reviewed.  Constitutional:      General: She is not in acute distress.    Appearance: She is not ill-appearing.  HENT:     Head: Normocephalic.     Right Ear: Tympanic membrane, ear canal and external ear normal.     Left Ear: Tympanic membrane, ear canal and external ear normal.     Nose: Nose normal.     Mouth/Throat:     Mouth: Mucous membranes are moist.  Eyes:     Extraocular Movements: Extraocular movements intact.     Conjunctiva/sclera: Conjunctivae normal.     Pupils: Pupils are equal, round, and reactive to light.  Neck:     Thyroid: No thyromegaly or thyroid tenderness.     Vascular: No carotid bruit.  Cardiovascular:     Rate and Rhythm: Normal rate and regular rhythm.     Pulses: Normal pulses.     Heart sounds: Normal heart sounds.  Pulmonary:     Effort: Pulmonary effort is normal.     Breath sounds: Normal breath sounds.  Abdominal:     General: Bowel sounds  are normal. There is no distension.     Palpations: Abdomen is soft.     Tenderness: There is no abdominal tenderness. There is no right CVA tenderness, left CVA tenderness, guarding or rebound.  Musculoskeletal:     Cervical back: Normal range of motion.     Right lower leg: No edema.     Left lower leg: No edema.  Lymphadenopathy:     Cervical: No cervical adenopathy.  Skin:    Capillary Refill: Capillary refill takes less than 2 seconds.  Neurological:     General: No focal deficit present.     Mental Status: She is alert and oriented to person, place, and time. Mental status is at baseline.     Motor: No weakness.  Psychiatric:        Mood and Affect: Mood normal.        Behavior: Behavior normal.        Thought Content: Thought content normal.        Judgment: Judgment normal.        04/08/2023   11:35 AM 11/11/2022    9:12 AM 10/08/2022    3:51 PM 07/21/2022    9:36  AM 07/08/2022   10:38 AM  Depression screen PHQ 2/9  Decreased Interest 1 1 0 1 0  Down, Depressed, Hopeless 0 0 0 0 0  PHQ - 2 Score 1 1 0 1 0  Altered sleeping 0 1 0    Tired, decreased energy 1 1 0    Change in appetite 0 0 0    Feeling bad or failure about yourself  0 0 0    Trouble concentrating 0 0 0    Moving slowly or fidgety/restless 3 0 0    Suicidal thoughts 0 0 0    PHQ-9 Score 5 3 0    Difficult doing work/chores Somewhat difficult Not difficult at all Not difficult at all        04/08/2023   11:36 AM 11/11/2022    9:12 AM 10/08/2022    3:51 PM 05/30/2020   10:07 AM  GAD 7 : Generalized Anxiety Score  Nervous, Anxious, on Edge 1 1 1  0  Control/stop worrying 1 1 1  0  Worry too much - different things 1 1 1  0  Trouble relaxing 0 1 1 0  Restless 0 0 0 0  Easily annoyed or irritable 1 1 0 0  Afraid - awful might happen 0 0 0 0  Total GAD 7 Score 4 5 4  0  Anxiety Difficulty Somewhat difficult Not difficult at all Somewhat difficult Not difficult at all    ASSESSMENT/PLAN:  Annual physical  exam Assessment & Plan: Mammogram up to date Recommend regular self breast exams PAP due. Patient to schedule appointment Colonoscopy up to date Hepatitis C/HIV screening completed Tetanus up to date Annual labs today   Hyperlipidemia, unspecified hyperlipidemia type Assessment & Plan: Chronic. Refill Lipitor 10 mg MWF Recheck fasting lipids  Orders: -     Atorvastatin Calcium; MWF at night  Dispense: 90 tablet; Refill: 3  Chronic frontal sinusitis Assessment & Plan: Chronic.  Stable.  Refill Flonase   Orders: -     Saccharomyces boulardii; Take 1 capsule (250 mg total) by mouth daily.  Dispense: 90 capsule; Refill: 0 -     Fluticasone Propionate; Place 2 sprays into both nostrils daily.  Dispense: 16 g; Refill: 6  Bilateral leg edema Assessment & Plan: Resolved Use compression stockings daily Elevate legs is much as possible Discontinue lasix   Orders: -     Potassium Chloride Crys ER; Take 1 tablet (20 mEq total) by mouth daily as needed. With lasix  Dispense: 30 tablet; Refill: 11  Gastroesophageal reflux disease, unspecified whether esophagitis present Assessment & Plan: Refill Protonix 40 mg daily  Orders: -     Pantoprazole Sodium; TAKE ONE TABLET EVERY DAY 30 MIN BEFORE DINNER  Dispense: 90 tablet; Refill: 3  Allergic rhinitis, unspecified seasonality, unspecified trigger Assessment & Plan: Chronic.  Stable. Refill Flonase. Refill Zyrtec 10 mg daily Refill Singular 10 mg nightly.  Orders: -     Cetirizine HCl; Take 1 tablet (10 mg total) by mouth at bedtime as needed.  Dispense: 90 tablet; Refill: 3 -     Montelukast Sodium; Take 1 tablet (10 mg total) by mouth at bedtime.  Dispense: 90 tablet; Refill: 3  Essential hypertension Assessment & Plan: Chronic  Asymptomatic. Refill hydrochlorothiazide to 25 mg Refill losartan 100 mg daily Continue to monitor blood pressure   Orders: -     Losartan Potassium; Take 1 tablet (100 mg total) by mouth  daily.  Dispense: 90 tablet; Refill: 3 -  hydroCHLOROthiazide; Take 1 tablet (25 mg total) by mouth daily.  Neuropathy Assessment & Plan: Chronic Takes Lyrica 75 mg daily Follow up with pain management as scheduled   Orders: -     DULoxetine HCl; Take 1 capsule (20 mg total) by mouth 2 (two) times daily.  Dispense: 180 capsule; Refill: 3  Mood disorder (HCC) -     DULoxetine HCl; Take 1 capsule (20 mg total) by mouth 2 (two) times daily.  Dispense: 180 capsule; Refill: 3  Abnormal glucose -     Hemoglobin A1c  Morbid obesity (HCC) -     Lipid panel -     Comprehensive metabolic panel -     TSH -     VITAMIN D 25 Hydroxy (Vit-D Deficiency, Fractures) -     Vitamin B12 -     CBC  Lumbosacral radiculopathy at L5 Assessment & Plan: Chronic Referral sent to Neurosurgery at [facility] at patients request   Orders: -     Ambulatory referral to Neurosurgery  Anemia, unspecified type -     Folate; Future -     IBC + Ferritin; Future  Seasonal asthma -     Albuterol Sulfate HFA; Inhale 2 puffs into the lungs every 6 (six) hours as needed for wheezing or shortness of breath.  Dispense: 1 each; Refill: 11  Seasonal allergies -     Olopatadine HCl; Apply 1 drop to eye daily. Both eyes  Dispense: 2.5 mL; Refill: 11 -     Hydrocortisone; Apply topically 2 (two) times daily. Prn to eyelids  Dispense: 30 g; Refill: 0   PDMP reviewed  Return if symptoms worsen or fail to improve.  Dana Allan, MD

## 2023-04-08 NOTE — Patient Instructions (Signed)
It was a pleasure meeting you today. Thank you for allowing me to take part in your health care.  Our goals for today as we discussed include:  We will get some labs today.  If they are abnormal or we need to do something about them, I will call you.  If they are normal, I will send you a message on MyChart (if it is active) or a letter in the mail.  If you don't hear from [place] in 2 weeks, please call the office at the number below.   Will send referral to neurosurgery at [facility].   If you have any questions or concerns, please do not hesitate to call the office at [phone].  I look forward to our next visit and until then take care and stay safe.  Regards,   Dana Allan, MD   [facility]

## 2023-04-09 ENCOUNTER — Ambulatory Visit (INDEPENDENT_AMBULATORY_CARE_PROVIDER_SITE_OTHER): Payer: 59

## 2023-04-09 DIAGNOSIS — D649 Anemia, unspecified: Secondary | ICD-10-CM

## 2023-04-09 LAB — IBC + FERRITIN
Ferritin: 170.7 ng/mL (ref 10.0–291.0)
Iron: 92 ug/dL (ref 42–145)
Saturation Ratios: 33 % (ref 20.0–50.0)
TIBC: 278.6 ug/dL (ref 250.0–450.0)
Transferrin: 199 mg/dL — ABNORMAL LOW (ref 212.0–360.0)

## 2023-04-09 LAB — FOLATE: Folate: 7.8 ng/mL (ref >5.9)

## 2023-04-16 ENCOUNTER — Encounter (INDEPENDENT_AMBULATORY_CARE_PROVIDER_SITE_OTHER): Payer: Self-pay

## 2023-04-24 ENCOUNTER — Encounter: Payer: Self-pay | Admitting: Family Medicine

## 2023-04-24 DIAGNOSIS — J302 Other seasonal allergic rhinitis: Secondary | ICD-10-CM | POA: Insufficient documentation

## 2023-04-24 DIAGNOSIS — D649 Anemia, unspecified: Secondary | ICD-10-CM | POA: Insufficient documentation

## 2023-04-24 DIAGNOSIS — F39 Unspecified mood [affective] disorder: Secondary | ICD-10-CM | POA: Insufficient documentation

## 2023-04-24 DIAGNOSIS — R7309 Other abnormal glucose: Secondary | ICD-10-CM | POA: Insufficient documentation

## 2023-04-24 DIAGNOSIS — Z0000 Encounter for general adult medical examination without abnormal findings: Secondary | ICD-10-CM | POA: Insufficient documentation

## 2023-04-24 NOTE — Assessment & Plan Note (Signed)
Chronic.  Stable. Refill Flonase. Refill Zyrtec 10 mg daily Refill Singular 10 mg nightly.

## 2023-04-24 NOTE — Assessment & Plan Note (Signed)
Chronic  Asymptomatic. Refill hydrochlorothiazide to 25 mg Refill losartan 100 mg daily Continue to monitor blood pressure

## 2023-04-24 NOTE — Assessment & Plan Note (Signed)
Chronic Takes Lyrica 75 mg daily Follow up with pain management as scheduled

## 2023-04-24 NOTE — Assessment & Plan Note (Signed)
Refill Protonix 40 mg daily

## 2023-04-24 NOTE — Assessment & Plan Note (Signed)
Chronic Referral sent to Neurosurgery at [facility] at patients request

## 2023-04-24 NOTE — Assessment & Plan Note (Addendum)
Chronic.  Stable.  Refill Flonase

## 2023-04-24 NOTE — Assessment & Plan Note (Signed)
Mammogram up to date Recommend regular self breast exams PAP due. Patient to schedule appointment Colonoscopy up to date Hepatitis C/HIV screening completed Tetanus up to date Annual labs today

## 2023-04-24 NOTE — Assessment & Plan Note (Signed)
Resolved Use compression stockings daily Elevate legs is much as possible Discontinue lasix

## 2023-04-24 NOTE — Assessment & Plan Note (Signed)
Chronic. Refill Lipitor 10 mg MWF Recheck fasting lipids

## 2023-05-05 ENCOUNTER — Encounter: Payer: 59 | Admitting: Family Medicine

## 2023-05-12 ENCOUNTER — Encounter: Payer: Self-pay | Admitting: Family Medicine

## 2023-05-12 ENCOUNTER — Other Ambulatory Visit: Payer: 59

## 2023-05-12 NOTE — Progress Notes (Deleted)
05/12/2023 Name: Anita Gibbs MRN: [id number] DOB: 1970-11-14  No chief complaint on file.   Anita Gibbs is a 52 y.o. year old female who presented for a telephone visit.   They were referred to the pharmacist by their PCP/care team for assistance with comprehensive medication review and teaching per patient request.   Last PCP visit 04/08/23   Subjective:  Care Team: Primary Care Provider: Dana Allan, MD ; Next Scheduled Visit: Not currently scheduled  Brillinta and Farxiga have come form cardiology  Medication Access/Adherence  Current Outpatient Medications   Medication Instructions    albuterol (VENTOLIN HFA) 108 (90 Base) MCG/ACT inhaler 2 puffs, Inhalation, Every 6 hours PRN    atorvastatin (LIPITOR) 10 MG tablet MWF at night    buprenorphine (BUTRANS) 10 MCG/HR PTWK 1 patch, Transdermal, Weekly    cetirizine (ZYRTEC ALLERGY) 10 mg, Oral, At bedtime PRN    Cholecalciferol (VITAMIN D-3) 125 MCG (5000 UT) TABS Oral, Daily    Cyanocobalamin (B-12 PO) Oral    dicyclomine (BENTYL) 20 MG tablet TAKE ONE TABLET BY MOUTH FOUR TIMES DAILY. TAKE BEFORE MEALS AND AT BEDTIME    diphenhydrAMINE (BENADRYL) 25 mg, Oral, Every 6 hours PRN    DULoxetine (CYMBALTA) 20 mg, Oral, 2 times daily    EPINEPHrine (AUVI-Q) 0.3 mg/0.3 mL IJ SOAJ injection Use as directed for severe allergic reaction    fluticasone (FLONASE) 50 MCG/ACT nasal spray 2 sprays, Each Nare, Daily    hydrochlorothiazide (HYDRODIURIL) 25 mg, Oral, Daily    hydrocortisone 2.5 % cream Topical, 2 times daily, Prn to eyelids    lidocaine 4 % Transdermal    losartan (COZAAR) 100 mg, Oral, Daily    meloxicam (MOBIC) 15 mg, Oral, Every 48 hours PRN    methocarbamol (ROBAXIN) 750 mg, Oral, Every 8 hours PRN    montelukast (SINGULAIR) 10 mg, Oral, Daily at bedtime    Olopatadine HCl 0.2 % SOLN 1 drop, Ophthalmic, Daily, Both eyes    pantoprazole (PROTONIX) 40 MG tablet TAKE ONE TABLET EVERY DAY 30 MIN BEFORE DINNER     polyethylene glycol powder (GLYCOLAX/MIRALAX) 17 g, Oral, 2 times daily PRN    potassium chloride SA (KLOR-CON M) 20 MEQ tablet 20 mEq, Oral, Daily PRN, With lasix    pregabalin (LYRICA) 75 mg, Oral, 3 times daily    saccharomyces boulardii (FLORASTOR) 250 mg, Oral, Daily     Patient reports affordability concerns with their medications: {YES/NO:21197} Patient reports access/transportation concerns to their pharmacy: {YES/NO:21197} Patient reports adherence concerns with their medications:  {YES/NO:21197} ***   {Pharmacy S/O Choices:26420}   Objective:  Lab Results  Component Value Date   HGBA1C 5.8 04/08/2023    Lab Results  Component Value Date   CREATININE 0.86 04/08/2023   BUN 11 04/08/2023   NA 139 04/08/2023   K 3.9 04/08/2023   CL 101 04/08/2023   CO2 32 04/08/2023    Lab Results  Component Value Date   CHOL 246 (H) 04/08/2023   HDL 64.60 04/08/2023   LDLCALC 159 (H) 04/08/2023   TRIG 113.0 04/08/2023   CHOLHDL 4 04/08/2023    Medications Reviewed Today   Medications were not reviewed in this encounter    Assessment/Plan:   {Pharmacy A/P Choices:26421}  Follow Up Plan: ***  ***

## 2023-05-13 ENCOUNTER — Ambulatory Visit: Payer: 59 | Admitting: Student in an Organized Health Care Education/Training Program

## 2023-05-13 ENCOUNTER — Telehealth: Payer: Self-pay | Admitting: Pharmacist

## 2023-05-13 ENCOUNTER — Encounter: Payer: Self-pay | Admitting: Pharmacist

## 2023-05-13 NOTE — Telephone Encounter (Signed)
Attempted to contact patient for requested medication review. Left HIPAA compliant message for patient to return my call at their convenience and sent message via MyChart.  Loree Fee, PharmD Clinical Pharmacist [facility] [phone]

## 2023-05-18 ENCOUNTER — Ambulatory Visit
Admission: RE | Admit: 2023-05-18 | Discharge: 2023-05-18 | Disposition: A | Discharge status: Home / Self Care | Payer: 59 | Source: Ambulatory Visit | Attending: Student in an Organized Health Care Education/Training Program | Admitting: Student in an Organized Health Care Education/Training Program

## 2023-05-18 ENCOUNTER — Encounter: Payer: Self-pay | Admitting: Student in an Organized Health Care Education/Training Program

## 2023-05-18 ENCOUNTER — Ambulatory Visit
Payer: 59 | Attending: Student in an Organized Health Care Education/Training Program | Admitting: Student in an Organized Health Care Education/Training Program

## 2023-05-18 DIAGNOSIS — M47816 Spondylosis without myelopathy or radiculopathy, lumbar region: Secondary | ICD-10-CM | POA: Insufficient documentation

## 2023-05-18 MED ORDER — FENTANYL CITRATE (PF) 100 MCG/2ML IJ SOLN
INTRAMUSCULAR | Status: AC
  Filled 2023-05-18: qty 2

## 2023-05-18 MED ORDER — MIDAZOLAM HCL 5 MG/5ML IJ SOLN
INTRAMUSCULAR | Status: AC
  Filled 2023-05-18: qty 5

## 2023-05-18 MED ORDER — LIDOCAINE HCL 2 % IJ SOLN
INTRAMUSCULAR | Status: AC
  Filled 2023-05-18: qty 20

## 2023-05-18 MED ORDER — DEXAMETHASONE SODIUM PHOSPHATE 10 MG/ML IJ SOLN
INTRAMUSCULAR | Status: AC
  Filled 2023-05-18: qty 1

## 2023-05-18 MED ORDER — MIDAZOLAM HCL 5 MG/5ML IJ SOLN
0.5000 mg | Freq: Once | INTRAMUSCULAR | Status: AC
  Administered 2023-05-18: 1.5 mg via INTRAVENOUS

## 2023-05-18 MED ORDER — ROPIVACAINE HCL 2 MG/ML IJ SOLN
INTRAMUSCULAR | Status: AC
  Filled 2023-05-18: qty 20

## 2023-05-18 MED ORDER — DEXAMETHASONE SODIUM PHOSPHATE 10 MG/ML IJ SOLN
10.0000 mg | Freq: Once | INTRAMUSCULAR | Status: AC
  Administered 2023-05-18: 10 mg

## 2023-05-18 MED ORDER — LIDOCAINE HCL 2 % IJ SOLN
20.0000 mL | Freq: Once | INTRAMUSCULAR | Status: AC
  Administered 2023-05-18: 400 mg

## 2023-05-18 MED ORDER — FENTANYL CITRATE (PF) 100 MCG/2ML IJ SOLN
25.0000 ug | INTRAMUSCULAR | Status: DC | PRN
  Administered 2023-05-18: 50 ug via INTRAVENOUS

## 2023-05-18 MED ORDER — ROPIVACAINE HCL 2 MG/ML IJ SOLN
9.0000 mL | Freq: Once | INTRAMUSCULAR | Status: AC
  Administered 2023-05-18: 9 mL via PERINEURAL

## 2023-05-18 MED ORDER — LACTATED RINGERS IV SOLN: Freq: Once | INTRAVENOUS | Status: AC

## 2023-05-18 NOTE — Progress Notes (Signed)
PROVIDER NOTE: Interpretation of information contained herein should be left to medically-trained personnel. Specific patient instructions are provided elsewhere under "Patient Instructions" section of medical record. This document was created in part using STT-dictation technology, any transcriptional errors that may result from this process are unintentional.  Patient: Anita Gibbs Type: Established DOB: 08/21/1971 MRN: [id number] PCP: Dana Allan, MD  Service: Procedure DOS: 05/18/2023 Setting: Ambulatory Location: Ambulatory outpatient facility Delivery: Face-to-face Provider: Edward Jolly, MD Specialty: Interventional Pain Management Specialty designation: 09 Location: Outpatient facility Ref. Prov.: Edward Jolly, MD       Interventional Therapy     Procedure: Lumbar Facet, Medial Branch Radiofrequency Ablation (RFA) #2  Laterality: Left (-LT)  Level: L3, L4, and L5 Medial Branch Level(s). These levels will denervate the L3-4 and L4-5 lumbar facet joints.  Imaging: Fluoroscopy-guided         Anesthesia: Local anesthesia (1-2% Lidocaine) Sedation: Moderate Sedation                       DOS: 05/18/2023  Performed by: Edward Jolly, MD  Purpose: Therapeutic/Palliative Indications: Low back pain severe enough to impact quality of life or function. Indications: 1. Lumbar facet arthropathy   2. Lumbar facet syndrome (Bilateral) (R>L)    Anita Gibbs has been dealing with the above chronic pain for longer than three months and has either failed to respond, was unable to tolerate, or simply did not get enough benefit from other more conservative therapies including, but not limited to: 1. Over-the-counter medications 2. Anti-inflammatory medications 3. Muscle relaxants 4. Membrane stabilizers 5. Opioids 6. Physical therapy and/or chiropractic manipulation 7. Modalities (Heat, ice, etc.) 8. Invasive techniques such as nerve blocks. [name] has attained more than 50% relief of  the pain from a series of diagnostic injections conducted in separate occasions.  Pain Score: Pre-procedure: 10-Worst pain ever/10 Post-procedure: 0-No pain/10     Position / Prep / Materials:  Position: Prone  Prep solution: DuraPrep (Iodine Povacrylex [0.7% available iodine] and Isopropyl Alcohol, 74% w/w) Prep Area: Entire Lumbosacral Region (Lower back from mid-thoracic region to end of tailbone and from flank to flank.) Materials:  Tray: RFA (Radiofrequency) tray Needle(s):  Type: RFA (Teflon-coated radiofrequency ablation needles)   Pre-op H&P Assessment:  [name] is a 52 y.o. (year old), female patient, seen today for interventional treatment. She  has a past surgical history that includes Knee arthroscopy (Right); Anterior cervical decomp/discectomy fusion (N/A, 07/01/2017); Abdominal hysterectomy (10/17/2020); and Carpal tunnel release (Bilateral). Anita Gibbs has a current medication list which includes the following prescription(s): albuterol, atorvastatin, buprenorphine, cetirizine, vitamin d-3, cyanocobalamin, dicyclomine, diphenhydramine, duloxetine, epinephrine, fluticasone, hydrochlorothiazide, hydrocortisone, lidocaine, losartan, meloxicam, methocarbamol, montelukast, olopatadine hcl, pantoprazole, polyethylene glycol powder, potassium chloride sa, pregabalin, and saccharomyces boulardii, and the following Facility-Administered Medications: fentanyl and lactated ringers. Her primarily concern today is the Back Pain (lower)  Initial Vital Signs:  Pulse/HCG Rate: (!) 57ECG Heart Rate: (!) 57 Temp: (!) 97.4 F (36.3 C) Resp: (!) 21 BP: (!) 173/73 SpO2: 100 %  BMI: Estimated body mass index is 41.1 kg/m as calculated from the following:   Height as of this encounter: 5\' 3"  (1.6 m).   Weight as of this encounter: 232 lb (105.2 kg).  Risk Assessment: Allergies: Reviewed. She is allergic to other, pineapple, shellfish allergy, strawberry (diagnostic), and tomato.   Allergy Precautions: None required Coagulopathies: Reviewed. None identified.  Blood-thinner therapy: None at this time Active Infection(s): Reviewed. None identified. Anita Gibbs is afebrile  Site Confirmation: Anita Gibbs was asked to confirm the procedure and laterality before marking the site Procedure checklist: Completed Consent: Before the procedure and under the influence of no sedative(s), amnesic(s), or anxiolytics, the patient was informed of the treatment options, risks and possible complications. To fulfill our ethical and legal obligations, as recommended by the American Medical Association's Code of Ethics, I have informed the patient of my clinical impression; the nature and purpose of the treatment or procedure; the risks, benefits, and possible complications of the intervention; the alternatives, including doing nothing; the risk(s) and benefit(s) of the alternative treatment(s) or procedure(s); and the risk(s) and benefit(s) of doing nothing. The patient was provided information about the general risks and possible complications associated with the procedure. These may include, but are not limited to: failure to achieve desired goals, infection, bleeding, organ or nerve damage, allergic reactions, paralysis, and death. In addition, the patient was informed of those risks and complications associated to Spine-related procedures, such as failure to decrease pain; infection (i.e.: Meningitis, epidural or intraspinal abscess); bleeding (i.e.: epidural hematoma, subarachnoid hemorrhage, or any other type of intraspinal or peri-dural bleeding); organ or nerve damage (i.e.: Any type of peripheral nerve, nerve root, or spinal cord injury) with subsequent damage to sensory, motor, and/or autonomic systems, resulting in permanent pain, numbness, and/or weakness of one or several areas of the body; allergic reactions; (i.e.: anaphylactic reaction); and/or death. Furthermore, the patient was  informed of those risks and complications associated with the medications. These include, but are not limited to: allergic reactions (i.e.: anaphylactic or anaphylactoid reaction(s)); adrenal axis suppression; blood sugar elevation that in diabetics may result in ketoacidosis or comma; water retention that in patients with history of congestive heart failure may result in shortness of breath, pulmonary edema, and decompensation with resultant heart failure; weight gain; swelling or edema; medication-induced neural toxicity; particulate matter embolism and blood vessel occlusion with resultant organ, and/or nervous system infarction; and/or aseptic necrosis of one or more joints. Finally, the patient was informed that Medicine is not an exact science; therefore, there is also the possibility of unforeseen or unpredictable risks and/or possible complications that may result in a catastrophic outcome. The patient indicated having understood very clearly. We have given the patient no guarantees and we have made no promises. Enough time was given to the patient to ask questions, all of which were answered to the patient's satisfaction. Ms. Dingmann has indicated that she wanted to continue with the procedure. Attestation: I, the ordering provider, attest that I have discussed with the patient the benefits, risks, side-effects, alternatives, likelihood of achieving goals, and potential problems during recovery for the procedure that I have provided informed consent. Date  Time: 05/18/2023  8:22 AM   Pre-Procedure Preparation:  Monitoring: As per clinic protocol. Respiration, ETCO2, SpO2, BP, heart rate and rhythm monitor placed and checked for adequate function Safety Precautions: Patient was assessed for positional comfort and pressure points before starting the procedure. Time-out: I initiated and conducted the "Time-out" before starting the procedure, as per protocol. The patient was asked to participate by  confirming the accuracy of the "Time Out" information. Verification of the correct person, site, and procedure were performed and confirmed by me, the nursing staff, and the patient. "Time-out" conducted as per Joint Commission's Universal Protocol (UP.01.01.01). Time: 8469  Description of Procedure:          Laterality: See above. Levels:  See above. Safety Precautions: Aspiration looking for blood return was conducted prior to  all injections. At no point did we inject any substances, as a needle was being advanced. Before injecting, the patient was told to immediately notify me if she was experiencing any new onset of "ringing in the ears, or metallic taste in the mouth". No attempts were made at seeking any paresthesias. Safe injection practices and needle disposal techniques used. Medications properly checked for expiration dates. SDV (single dose vial) medications used. After the completion of the procedure, all disposable equipment used was discarded in the proper designated medical waste containers. Local Anesthesia: Protocol guidelines were followed. The patient was positioned over the fluoroscopy table. The area was prepped in the usual manner. The time-out was completed. The target area was identified using fluoroscopy. A 12-in long, straight, sterile hemostat was used with fluoroscopic guidance to locate the targets for each level blocked. Once located, the skin was marked with an approved surgical skin marker. Once all sites were marked, the skin (epidermis, dermis, and hypodermis), as well as deeper tissues (fat, connective tissue and muscle) were infiltrated with a small amount of a short-acting local anesthetic, loaded on a 10cc syringe with a 25G, 1.5-in  Needle. An appropriate amount of time was allowed for local anesthetics to take effect before proceeding to the next step. Technical description of process:  Radiofrequency Ablation (RFA) L3 Medial Branch Nerve RFA: The target area for the  L3 medial branch is at the junction of the postero-lateral aspect of the superior articular process and the superior, posterior, and medial edge of the transverse process of L4. Under fluoroscopic guidance, a Radiofrequency needle was inserted until contact was made with os over the superior postero-lateral aspect of the pedicular shadow (target area). Sensory and motor testing was conducted to properly adjust the position of the needle. Once satisfactory placement of the needle was achieved, the numbing solution was slowly injected after negative aspiration for blood. 2.0 mL of the nerve block solution was injected without difficulty or complication. After waiting for at least 3 minutes, the ablation was performed. Once completed, the needle was removed intact. L4 Medial Branch Nerve RFA: The target area for the L4 medial branch is at the junction of the postero-lateral aspect of the superior articular process and the superior, posterior, and medial edge of the transverse process of L5. Under fluoroscopic guidance, a Radiofrequency needle was inserted until contact was made with os over the superior postero-lateral aspect of the pedicular shadow (target area). Sensory and motor testing was conducted to properly adjust the position of the needle. Once satisfactory placement of the needle was achieved, the numbing solution was slowly injected after negative aspiration for blood. 2.0 mL of the nerve block solution was injected without difficulty or complication. After waiting for at least 3 minutes, the ablation was performed. Once completed, the needle was removed intact. L5 Medial Branch Nerve RFA: The target area for the L5 medial branch is at the junction of the postero-lateral aspect of the superior articular process of S1 and the superior, posterior, and medial edge of the sacral ala. Under fluoroscopic guidance, a Radiofrequency needle was inserted until contact was made with os over the superior  postero-lateral aspect of the pedicular shadow (target area). Sensory and motor testing was conducted to properly adjust the position of the needle. Once satisfactory placement of the needle was achieved, the numbing solution was slowly injected after negative aspiration for blood. 2.0 mL of the nerve block solution was injected without difficulty or complication. After waiting for at least 3 minutes,  the ablation was performed. Once completed, the needle was removed intact.  Radiofrequency lesioning (ablation):  Radiofrequency Generator: Medtronic AccurianTM AG 1000 RF Generator Sensory Stimulation Parameters: 50 Hz was used to locate & identify the nerve, making sure that the needle was positioned such that there was no sensory stimulation below 0.3 V or above 0.7 V. Motor Stimulation Parameters: 2 Hz was used to evaluate the motor component. Care was taken not to lesion any nerves that demonstrated motor stimulation of the lower extremities at an output of less than 2.5 times that of the sensory threshold, or a maximum of 2.0 V. Lesioning Technique Parameters: Standard Radiofrequency settings. (Not bipolar or pulsed.) Temperature Settings: 80 degrees C Lesioning time: 60 seconds Intra-operative Compliance: Compliant   6 cc solution made of 5 cc of 0.2% ropivacaine, 1 cc of Decadron 10 mg/cc.  2 cc injected at each level above on the left.   Once the entire procedure was completed, the treated area was cleaned, making sure to leave some of the prepping solution back to take advantage of its long term bactericidal properties.    Illustration of the posterior view of the lumbar spine and the posterior neural structures. Laminae of L2 through S1 are labeled. DPRL5, dorsal primary ramus of L5; DPRS1, dorsal primary ramus of S1; DPR3, dorsal primary ramus of L3; FJ, facet (zygapophyseal) joint L3-L4; I, inferior articular process of L4; LB1, lateral branch of dorsal primary ramus of L1; IAB, inferior  articular branches from L3 medial branch (supplies L4-L5 facet joint); IBP, intermediate branch plexus; MB3, medial branch of dorsal primary ramus of L3; NR3, third lumbar nerve root; S, superior articular process of L5; SAB, superior articular branches from L4 (supplies L4-5 facet joint also); TP3, transverse process of L3.  Vitals:   05/18/23 0905 05/18/23 0910 05/18/23 0915 05/18/23 0918  BP: (!) 161/91 (!) 155/92 (!) 164/103 (!) 160/123  Pulse:      Resp: (!) 23 (!) 24 16 17   Temp:      SpO2: 100% 100% 100% 100%  Weight:      Height:        Start Time: 0855 hrs. End Time: 0917 hrs.  Imaging Guidance (Spinal):          Type of Imaging Technique: Fluoroscopy Guidance (Spinal) Indication(s): Assistance in needle guidance and placement for procedures requiring needle placement in or near specific anatomical locations not easily accessible without such assistance. Exposure Time: Please see nurses notes. Contrast: None used. Fluoroscopic Guidance: I was personally present during the use of fluoroscopy. "Tunnel Vision Technique" used to obtain the best possible view of the target area. Parallax error corrected before commencing the procedure. "Direction-depth-direction" technique used to introduce the needle under continuous pulsed fluoroscopy. Once target was reached, antero-posterior, oblique, and lateral fluoroscopic projection used confirm needle placement in all planes. Images permanently stored in EMR. Interpretation: No contrast injected. I personally interpreted the imaging intraoperatively. Adequate needle placement confirmed in multiple planes. Permanent images saved into the patient's record.  Antibiotic Prophylaxis:   Anti-infectives (From admission, onward)    None      Indication(s): None identified  Post-operative Assessment:  Post-procedure Vital Signs:  Pulse/HCG Rate: (!) 57(!) 56 Temp: (!) 97.4 F (36.3 C) Resp: 17 BP: (!) 160/123 SpO2: 100 %  EBL:  None  Complications: No immediate post-treatment complications observed by team, or reported by patient.  Note: The patient tolerated the entire procedure well. A repeat set of vitals were taken after the procedure  and the patient was kept under observation following institutional policy, for this type of procedure. Post-procedural neurological assessment was performed, showing return to baseline, prior to discharge. The patient was provided with post-procedure discharge instructions, including a section on how to identify potential problems. Should any problems arise concerning this procedure, the patient was given instructions to immediately contact us, at any time, without hesitation. In any case, we plan to contact the patient by telephone for a follow-up status report regarding this interventional procedure.  Comments:  No additional relevant information.  Plan of Care (POC)  Orders:  Orders Placed This Encounter  Procedures   DG PAIN [facility] C-ARM 1-60 MIN NO REPORT    Intraoperative interpretation by procedural physician at [facility].    Standing Status:   Standing    Number of Occurrences:   1    Order Specific Question:   Reason for exam:    Answer:   Assistance in needle guidance and placement for procedures requiring needle placement in or near specific anatomical locations not easily accessible without such assistance.    Medications ordered for procedure: Meds ordered this encounter  Medications   lidocaine (XYLOCAINE) 2 % (with pres) injection 400 mg   lactated ringers infusion   midazolam (VERSED) 5 MG/5ML injection 0.5-2 mg    Make sure Flumazenil is available in the pyxis when using this medication. If oversedation occurs, administer 0.2 mg IV over 15 sec. If after 45 sec no response, administer 0.2 mg again over 1 min; may repeat at 1 min intervals; not to exceed 4 doses (1 mg)   fentaNYL (SUBLIMAZE) injection 25-50 mcg    Make sure Narcan is available in the  pyxis when using this medication. In the event of respiratory depression (RR< 8/min): Titrate NARCAN (naloxone) in increments of 0.1 to 0.2 mg IV at 2-3 minute intervals, until desired degree of reversal.   ropivacaine (PF) 2 mg/mL (0.2%) (NAROPIN) injection 9 mL   dexamethasone (DECADRON) injection 10 mg   Medications administered: We administered lidocaine, lactated ringers, midazolam, fentaNYL, ropivacaine (PF) 2 mg/mL (0.2%), and dexamethasone.  See the medical record for exact dosing, route, and time of administration.  Follow-up plan:   Return for Keep sch. appt.     Recent Visits Date Type Provider Dept  03/19/23 Office Visit Edward Jolly, MD [facility]  Showing recent visits within past 90 days and meeting all other requirements Today's Visits Date Type Provider Dept  05/18/23 Procedure visit Edward Jolly, MD [facility]  Showing today's visits and meeting all other requirements Future Appointments Date Type Provider Dept  06/11/23 Appointment Edward Jolly, MD [facility]  Showing future appointments within next 90 days and meeting all other requirements  Disposition: Discharge home  Discharge (Date  Time): 05/18/2023;   hrs.   Primary Care Physician: Dana Allan, MD Location: [facility] Note by: Edward Jolly, MD Date: 05/18/2023; Time: 9:23 AM  Disclaimer:  Medicine is not an exact science. The only guarantee in medicine is that nothing is guaranteed. It is important to note that the decision to proceed with this intervention was based on the information collected from the patient. The Data and conclusions were drawn from the patient's questionnaire, the interview, and the physical examination. Because the information was provided in large part by the patient, it cannot be guaranteed that it has not been purposely or unconsciously manipulated. Every effort has been made to obtain as much relevant data as  possible for this evaluation. It is important to note that the conclusions that lead to this procedure are derived in large part from the available data. Always take into account that the treatment will also be dependent on availability of resources and existing treatment guidelines, considered by other Pain Management Practitioners as being common knowledge and practice, at the time of the intervention. For Medico-Legal purposes, it is also important to point out that variation in procedural techniques and pharmacological choices are the acceptable norm. The indications, contraindications, technique, and results of the above procedure should only be interpreted and judged by a Board-Certified Interventional Pain Specialist with extensive familiarity and expertise in the same exact procedure and technique.

## 2023-05-18 NOTE — Patient Instructions (Signed)
Post-procedure Information What to expect: Most procedures involve the use of a local anesthetic (numbing medicine), and a steroid (anti-inflammatory medicine).  The local anesthetics may cause temporary numbness and weakness of the legs or arms, depending on the location of the block. This numbness/weakness may last 4-6 hours, depending on the local anesthetic used. In rare instances, it can last up to 24 hours. While numb, you must be very careful not to injure the extremity.  After any procedure, you could expect the pain to get better within 15-20 minutes. This relief is temporary and may last 4-6 hours. Once the local anesthetics wears off, you could experience discomfort, possibly more than usual, for up to 10 (ten) days. In the case of radiofrequencies, it may last up to 6 weeks. Surgeries may take up to 8 weeks for the healing process. The discomfort is due to the irritation caused by needles going through skin and muscle. To minimize the discomfort, we recommend using ice the first day, and heat from then on. The ice should be applied for 15 minutes on, and 15 minutes off. Keep repeating this cycle until bedtime. Avoid applying the ice directly to the skin, to prevent frostbite. Heat should be used daily, until the pain improves (4-10 days). Be careful not to burn yourself.  Occasionally you may experience muscle spasms or cramps. These occur as a consequence of the irritation caused by the needle sticks to the muscle and the blood that will inevitably be lost into the surrounding muscle tissue. Blood tends to be very irritating to tissues, which tend to react by going into spasm. These spasms may start the same day of your procedure, but they may also take days to develop. This late onset type of spasm or cramp is usually caused by electrolyte imbalances triggered by the steroids, at the level of the kidney. Cramps and spasms tend to respond well to muscle relaxants, multivitamins (some are  triggered by the procedure, but may have their origins in vitamin deficiencies), and "Gatorade", or any sports drinks that can replenish any electrolyte imbalances. (If you are a diabetic, ask your pharmacist to get you a sugar-free brand.) Warm showers or baths may also be helpful. Stretching exercises are highly recommended. General Instructions:  Be alert for signs of possible infection: redness, swelling, heat, red streaks, elevated temperature, and/or fever. These typically appear 4 to 6 days after the procedure. Immediately notify your doctor if you experience unusual bleeding, difficulty breathing, or loss of bowel or bladder control. If you experience increased pain, do not increase your pain medicine intake, unless instructed by your pain physician. Post-Procedure Care:  Be careful in moving about. Muscle spasms in the area of the injection may occur. Applying ice or heat to the area is often helpful. The incidence of spinal headaches after epidural injections ranges between 1.4% and 6%. If you develop a headache that does not seem to respond to conservative therapy, please let your physician know. This can be treated with an epidural blood patch.   Post-procedure numbness or redness is to be expected, however it should average 4 to 6 hours. If numbness and weakness of your extremities begins to develop 4 to 6 hours after your procedure, and is felt to be progressing and worsening, immediately contact your physician.   Diet:  If you experience nausea, do not eat until this sensation goes away. If you had a "Stellate Ganglion Block" for upper extremity "Reflex Sympathetic Dystrophy", do not eat or drink until your  hoarseness goes away. In any case, always start with liquids first and if you tolerate them well, then slowly progress to more solid foods. Activity:  For the first 4 to 6 hours after the procedure, use caution in moving about as you may experience numbness and/or weakness. Use caution in  cooking, using household electrical appliances, and climbing steps. If you need to reach your Doctor call our office: [phone] Monday-Thursday 8:00 am - 4:00 PM    Fridays: Closed     In case of an emergency: In case of emergency, call 911 or go to the nearest emergency room and have the physician there call us.  Interpretation of Procedure Every nerve block has two components: a diagnostic component, and a treatment component. Unrealistic expectations are the most common causes of "perceived failure".  In a perfect world, a single nerve block should be able to completely and permanently eliminate the pain. Sadly, the world is not perfect.  Most pain management nerve blocks are performed using local anesthetics and steroids. Steroids are responsible for any long-term benefit that you may experience. Their purpose is to decrease any chronic swelling that may exist in the area. Steroids begin to work immediately after being injected. However, most patients will not experience any benefits until 5 to 10 days after the injection, when the swelling has come down to the point where they can tell a difference. Steroids will only help if there is swelling to be treated. As such, they can assist with the diagnosis. If effective, they suggest an inflammatory component to the pain, and if ineffective, they rule out inflammation as the main cause or component of the problem. If the problem is one of mechanical compression, you will get no benefit from those steroids.   In the case of local anesthetics, they have a crucial role in the diagnosis of your condition. Most will begin to work within15 to 20 minutes after injection. The duration will depend on the type used (short- vs. Long-acting). It is of outmost importance that patients keep tract of their pain, after the procedure. To assist with this matter, a "Post-procedure Pain Diary" is provided. Make sure to complete it and to bring it back to your  follow-up appointment.  As long as the patient keeps accurate, detailed records of their symptoms after every procedure, and returns to have those interpreted, every procedure will provide [place] with invaluable information. Even a block that does not provide the patient with any relief, will always provide [place] with information about the mechanism and the origin of the pain. The only time a nerve block can be considered a waste of time is when patients do not keep track of the results, or do not keep their post-procedure appointment.  Reporting the results back to your physician The Pain Score  Pain is a subjective complaint. It cannot be seen, touched, or measured. We depend entirely on the patient's report of the pain in order to assess your condition and treatment. To evaluate the pain, we use a pain scale, where "0" means "No Pain", and a "10" is "the worst possible pain that you can even imagine" (i.e. something like been eaten alive by a shark or being torn apart by a lion).   You will frequently be asked to rate your pain. Please be as accurate, remember that medical decisions will be based on your responses. Please do not rate your pain above a 10. Doing so is actually interpreted as "symptom magnification" (exaggeration), as  well as lack of understanding with regards to the scale. To put this into perspective, when you tell us that your pain is at a 10 (ten), what you are saying is that there is nothing we can do to make this pain any worse. (Carefully think about that.) Radiofrequency Ablation Radiofrequency ablation is a procedure that is performed to relieve pain. The procedure is often used for back, neck, or arm pain. Radiofrequency ablation involves the use of a machine that creates radio waves to make heat. During the procedure, the heat is applied to the nerve that carries the pain signal. The heat damages the nerve and interferes with the pain signal. Pain relief usually starts about 2 weeks  after the procedure and lasts for 6 months to 1 year. Tell a health care provider about: Any allergies you have. All medicines you are taking, including vitamins, herbs, eye drops, creams, and over-the-counter medicines. Any problems you or family members have had with anesthetic medicines. Any bleeding problems you have. Any surgeries you have had. Any medical conditions you have. Whether you are pregnant or may be pregnant. What are the risks? Generally, this is a safe procedure. However, problems may occur, including: Pain or soreness at the injection site. Allergic reaction to medicines given during the procedure. Bleeding. Infection at the injection site. Damage to nerves or blood vessels. What happens before the procedure? When to stop eating and drinking Follow instructions from your health care provider about what you may eat and drink before your procedure. These may include: 8 hours before the procedure Stop eating most foods. Do not eat meat, fried foods, or fatty foods. Eat only light foods, such as toast or crackers. All liquids are okay except energy drinks and alcohol. 6 hours before the procedure Stop eating. Drink only clear liquids, such as water, clear fruit juice, black coffee, plain tea, and sports drinks. Do not drink energy drinks or alcohol. 2 hours before the procedure Stop drinking all liquids. You may be allowed to take medicine with small sips of water. If you do not follow your health care provider's instructions, your procedure may be delayed or canceled. Medicines Ask your health care provider about: Changing or stopping your regular medicines. This is especially important if you are taking diabetes medicines or blood thinners. Taking medicines such as aspirin and ibuprofen. These medicines can thin your blood. Do not take these medicines unless your health care provider tells you to take them. Taking over-the-counter medicines, vitamins, herbs, and  supplements. General instructions Ask your health care provider what steps will be taken to help prevent infection. These steps may include: Removing hair at the procedure site. Washing skin with a germ-killing soap. Taking antibiotic medicine. If you will be going home right after the procedure, plan to have a responsible adult: Take you home from the hospital or clinic. You will not be allowed to drive. Care for you for the time you are told. What happens during the procedure?  You will be awake during the procedure. You will need to be able to talk with the health care provider during the procedure. An IV will be inserted into one of your veins. You will be given one or more of the following: A medicine to help you relax (sedative). A medicine to numb the area (local anesthetic). Your health care provider will insert a radiofrequency needle into the area to be treated. This is done with the help of fluoroscopy. A wire that carries the radio waves (  electrode) will be put through the radiofrequency needle. An electrical pulse will be sent through the electrode to verify the correct nerve that is causing your pain. You will feel a tingling sensation, and you may have muscle twitching. The tissue around the needle tip will be heated by an electric current that comes from the radiofrequency machine. This will numb the nerves. The needle will be removed. A bandage (dressing) will be put on the insertion area. The procedure may vary among health care providers and hospitals. What happens after the procedure? Your blood pressure, heart rate, breathing rate, and blood oxygen level will be monitored until you leave the hospital or clinic. Return to your normal activities as told by your health care provider. Ask your health care provider what activities are safe for you. If you were given a sedative during the procedure, it can affect you for several hours. Do not drive or operate machinery until  your health care provider says that it is safe. Summary Radiofrequency ablation is a procedure that is performed to relieve pain. The procedure is often used for back, neck, or arm pain. Radiofrequency ablation involves the use of a machine that creates radio waves to make heat. Plan to have a responsible adult take you home from the hospital or clinic. Do not drive or operate machinery until your health care provider says that it is safe. Return to your normal activities as told by your health care provider. Ask your health care provider what activities are safe for you. This information is not intended to replace advice given to you by your health care provider. Make sure you discuss any questions you have with your health care provider. Document Revised: 02/05/2021 Document Reviewed: 02/05/2021 Elsevier Patient Education  2024 [employer].

## 2023-05-18 NOTE — Progress Notes (Signed)
Safety precautions to be maintained throughout the outpatient stay will include: orient to surroundings, keep bed in low position, maintain call bell within reach at all times, provide assistance with transfer out of bed and ambulation.

## 2023-05-19 ENCOUNTER — Telehealth: Payer: Self-pay

## 2023-05-19 NOTE — Telephone Encounter (Signed)
Post procedure follow up.  LM 

## 2023-05-21 DIAGNOSIS — R079 Chest pain, unspecified: Secondary | ICD-10-CM | POA: Diagnosis not present

## 2023-05-21 DIAGNOSIS — R071 Chest pain on breathing: Secondary | ICD-10-CM | POA: Diagnosis not present

## 2023-05-21 DIAGNOSIS — I10 Essential (primary) hypertension: Secondary | ICD-10-CM | POA: Diagnosis not present

## 2023-05-21 DIAGNOSIS — R0602 Shortness of breath: Secondary | ICD-10-CM | POA: Diagnosis not present

## 2023-05-21 DIAGNOSIS — Z20822 Contact with and (suspected) exposure to covid-19: Secondary | ICD-10-CM | POA: Diagnosis not present

## 2023-05-22 DIAGNOSIS — R079 Chest pain, unspecified: Secondary | ICD-10-CM | POA: Diagnosis not present

## 2023-06-04 ENCOUNTER — Telehealth: Payer: Self-pay

## 2023-06-04 NOTE — Telephone Encounter (Signed)
Patient states she has been trying to get her medication situated.  Patient states she feels like she has been going back and forth regarding scheduling appointments and her medications.  Patient states she is supposed to be taking medication for her cholesterol but she isn't sure what she is supposed to be taking.  Patient states Dr. Astrid Divine nurse told her that she needed to be taking crestor but she sent a message back to her saying that was wrong.  Patient states she is not sure what she should be taking.  I reviewed patient message from Kristie Cowman, CMA, with patient.  Patient states she would like to be referred to our pharmacy team.  Patient states she would like to transfer to [name], NP.  I let patient know that I will send a message to Dr. Clent Ridges and [name], NP, regarding the transfer of care and I will include our practice administrator on the message as well.

## 2023-06-10 ENCOUNTER — Other Ambulatory Visit: Payer: Self-pay

## 2023-06-10 DIAGNOSIS — I10 Essential (primary) hypertension: Secondary | ICD-10-CM

## 2023-06-10 MED ORDER — HYDROCHLOROTHIAZIDE 25 MG PO TABS
25.0000 mg | ORAL_TABLET | Freq: Every day | ORAL | 0 refills | Status: DC
Start: 2023-06-10 — End: 2023-09-30

## 2023-06-11 ENCOUNTER — Encounter: Payer: Self-pay | Admitting: Student in an Organized Health Care Education/Training Program

## 2023-06-11 ENCOUNTER — Ambulatory Visit
Payer: 59 | Attending: Student in an Organized Health Care Education/Training Program | Admitting: Student in an Organized Health Care Education/Training Program

## 2023-06-11 VITALS — BP 142/75 | HR 60 | Temp 97.3°F | Resp 17 | Ht 63.0 in | Wt 232.0 lb

## 2023-06-11 DIAGNOSIS — M47816 Spondylosis without myelopathy or radiculopathy, lumbar region: Secondary | ICD-10-CM | POA: Insufficient documentation

## 2023-06-11 DIAGNOSIS — M792 Neuralgia and neuritis, unspecified: Secondary | ICD-10-CM | POA: Insufficient documentation

## 2023-06-11 DIAGNOSIS — G894 Chronic pain syndrome: Secondary | ICD-10-CM | POA: Insufficient documentation

## 2023-06-11 DIAGNOSIS — M5441 Lumbago with sciatica, right side: Secondary | ICD-10-CM | POA: Insufficient documentation

## 2023-06-11 DIAGNOSIS — M5416 Radiculopathy, lumbar region: Secondary | ICD-10-CM | POA: Diagnosis not present

## 2023-06-11 DIAGNOSIS — M4726 Other spondylosis with radiculopathy, lumbar region: Secondary | ICD-10-CM | POA: Diagnosis not present

## 2023-06-11 DIAGNOSIS — G8929 Other chronic pain: Secondary | ICD-10-CM | POA: Insufficient documentation

## 2023-06-11 DIAGNOSIS — M5442 Lumbago with sciatica, left side: Secondary | ICD-10-CM | POA: Diagnosis not present

## 2023-06-11 MED ORDER — BUPRENORPHINE 15 MCG/HR TD PTWK: 1.0000 | MEDICATED_PATCH | TRANSDERMAL | 3 refills | Status: AC

## 2023-06-11 MED ORDER — BUPRENORPHINE 15 MCG/HR TD PTWK: 1.0000 | MEDICATED_PATCH | TRANSDERMAL | 3 refills | Status: DC

## 2023-06-11 NOTE — Progress Notes (Deleted)
Patient: Anita Gibbs  Service Category: E/M  Provider: Edward Jolly, MD  DOB: [birth year]  DOS: 06/11/2023  Referring Provider: Dana Allan, MD  MRN: [id number]  Setting: Ambulatory outpatient  PCP: Dana Allan, MD  Type: New Patient  Specialty: Interventional Pain Management    Location: Office  Delivery: Face-to-face     Primary Reason(s) for Visit: Encounter for initial evaluation of one or more chronic problems (new to examiner) potentially causing chronic pain, and posing a threat to normal musculoskeletal function. (Level of risk: High) CC: Back Pain (lower)  HPI  Anita Gibbs is a 52 y.o. year old, female patient, who comes for the first time to our practice referred by Dana Allan, MD for our initial evaluation of her chronic pain. She has Essential hypertension; Chronic pain syndrome; DDD (degenerative disc disease), cervical; Cervical spondylosis; DDD (degenerative disc disease), lumbar; Vitamin D deficiency; Seasonal asthma; Gastroesophageal reflux disease; Fibroid uterus; HLD (hyperlipidemia); Pain medication agreement broken; Vaginal discharge; Morbid obesity with BMI of 45.0-49.9, adult (HCC); Bilateral leg edema; Lumbosacral radiculopathy at L5; Neuropathy; Allergic rhinitis; Abdominal pain, left lower quadrant; Chronic frontal sinusitis; Morbid obesity (HCC); Abnormal glucose; Mood disorder (HCC); Annual physical exam; Seasonal allergies; and Anemia on their problem list. Today she comes in for evaluation of her Back Pain (lower)  Pain Assessment: Location: Lower Back Radiating: down left leg to tops of left toes Onset: More than a month ago Duration: Chronic pain Quality: Constant, Stabbing ("punching" pain) Severity: 9 /10 (subjective, self-reported pain score)  Effect on ADL: limits daily activities "severely" Timing: Constant Modifying factors: heat, ice, meds BP: (!) 142/75  HR: 60  Onset and Duration: {Hx; Onset and Duration:210120511} Cause of pain: {Hx;  Cause:210120521} Severity: {Pain Severity:210120502} Timing: {Symptoms; Timing:210120501} Aggravating Factors: {Causes; Aggravating pain factors:210120507} Alleviating Factors: {Causes; Alleviating Factors:210120500} Associated Problems: {Hx; Associated problems:210120515} Quality of Pain: {Hx; Symptom quality or Descriptor:210120531} Previous Examinations or Tests: {Hx; Previous examinations or test:210120529} Previous Treatments: {Hx; Previous Treatment:210120503}  Anita Gibbs is being evaluated for possible interventional pain management therapies for the treatment of her chronic pain.   ***  Anita Gibbs has been informed that this initial visit was an evaluation only.  On the follow up appointment I will go over the results, including ordered tests and available interventional therapies. At that time she will have the opportunity to decide whether to proceed with offered therapies or not. In the event that Anita Gibbs prefers avoiding interventional options, this will conclude our involvement in the case.  Medication management recommendations may be provided upon request.  Patient informed that diagnostic tests may be ordered to assist in identifying underlying causes, narrow the list of differential diagnoses and aid in determining candidacy for (or contraindications to) planned therapeutic interventions.  Historic Controlled Substance Pharmacotherapy Review  PMP and historical list of controlled substances: ***  Most recently prescribed opioid analgesics:   *** MME/day: *** mg/day  Historical Monitoring: The patient  reports no history of drug use. List of prior UDS Testing: No results found for: "MDMA", "COCAINSCRNUR", "PCPSCRNUR", "PCPQUANT", "CANNABQUANT", "THCU", "ETH", "CBDTHCR", "D8THCCBX", "D9THCCBX" Historical Background Evaluation: [place] PMP: PDMP not reviewed this encounter. {Blank single:19197::"   ","Unable to conduct review of the controlled substance reporting system due  to technological failure.","Six (6) year initial data search conducted.","Review of the past 51-months conducted."} {Blank single:19197::"Abnormal pattern detected.","No abnormal patterns identified.","Database search revealed no record of prior opioid analgesics.","Database search revealed no record of prior controlled substances.","Regular, uninterrupted pattern of monthly opioid refills detected.","Discrepancies found  between information provided by the patient and the database.","     "} {Blank single:19197::"Pattern of multiple prescribers found","Pattern of multiple pharmacies found","A pattern of multiple prescribers and multiple pharmacies was identified","     "} PMP NARX Score Report:  Narcotic: *** Sedative: *** Stimulant: *** [place] Department of public safety, offender search: Engineer, mining Information) {Blank single:19197::"Positive for","No criminal record(s) found","Non-contributory"} Risk Assessment Profile: Aberrant behavior: {Aberrant Behavior:210120800::"None observed or detected today"} Risk factors for fatal opioid overdose: {Risk Factors:210120801::"None identified today"} PMP NARX Overdose Risk Score: *** Fatal overdose hazard ratio (HR): {Blank single:19197::"1.32 for 20-49 MME/day","1.92 for 50-99 MME/day","2.04 for doses equal to, or higher than 100 MME/day","Calculation deferred"} Non-fatal overdose hazard ratio (HR): {Blank single:19197::"1.44 for 20-49 MME/day","3.73 for 50-99 MME/day","8.87 for 100-199 MME/day","2.88 for doses equal to, or higher than 200 MME/day","Calculation deferred"} Risk of opioid abuse or dependence: 0.7-3.0% with doses <= 36 MME/day and 6.1-26% with doses >= 120 MME/day. Substance use disorder (SUD) risk level: {Blank single:19197::"High","Moderate","Low","Pending results of Medical Psychology Evaluation for SUD","See ORT evaluation","See below"} Personal History of Substance Abuse (SUD-Substance use disorder):  Alcohol:    Illegal Drugs:    Rx Drugs:     ORT Risk Level calculation:    ORT Scoring interpretation table:  Score <3 = Low Risk for SUD  Score between 4-7 = Moderate Risk for SUD  Score >8 = High Risk for Opioid Abuse   PHQ-2 Depression Scale:  Total score: 0  PHQ-2 Scoring interpretation table: (Score and probability of major depressive disorder)  Score 0 = No depression  Score 1 = 15.4% Probability  Score 2 = 21.1% Probability  Score 3 = 38.4% Probability  Score 4 = 45.5% Probability  Score 5 = 56.4% Probability  Score 6 = 78.6% Probability   PHQ-9 Depression Scale:  Total score: 0  PHQ-9 Scoring interpretation table:  Score 0-4 = No depression  Score 5-9 = Mild depression  Score 10-14 = Moderate depression  Score 15-19 = Moderately severe depression  Score 20-27 = Severe depression (2.4 times higher risk of SUD and 2.89 times higher risk of overuse)   Pharmacologic Plan: {Blank single:19197::"None at this point.","Non-opioid analgesic therapy offered.","Further information needed.","No further testing ordered.","Adjust therapy: ***","Discontinue opioid analgesic therapy.","Continue therapy as is.","No opioid analgesics.","Anita Gibbs indicated having a preference to stay away from opioid analgesics.","Anita Gibbs did not have a "Drug Screening Test" done today.","As per protocol, I have not taken over any controlled substance management, pending the results of ordered tests and/or consults."} {Blank single:19197::"She declined/refused having a "Drug Screening test" done today.","She left without providing [place] with a sample for the "Drug Screening Test".","Anita Gibbs states being against the use of "opioid analgesics", as part of her treatment plan.","Anita Gibbs has expressed her preference to stay away from controlled substances.","Anita Gibbs has indicated not being interested in our services, at this time.","          "} Initial impression: {Blank single:19197::"N/A","The patient indicated having no interest, at this  time.","Anita Gibbs indicated having no interest in opioid therapy, at this point.","Poor candidate for opioid analgesics.","High risk for opiate therapy.","No immediate contraindications found.","Pending review of available data and ordered tests."}  Meds   Current Outpatient Medications:    albuterol (VENTOLIN HFA) 108 (90 Base) MCG/ACT inhaler, Inhale 2 puffs into the lungs every 6 (six) hours as needed for wheezing or shortness of breath., Disp: 1 each, Rfl: 11   atorvastatin (LIPITOR) 10 MG tablet, MWF at night, Disp: 90 tablet, Rfl: 3   buprenorphine (BUTRANS) 15 MCG/HR,  Place 1 patch onto the skin once a week., Disp: 4 patch, Rfl: 3   cetirizine (ZYRTEC ALLERGY) 10 MG tablet, Take 1 tablet (10 mg total) by mouth at bedtime as needed., Disp: 90 tablet, Rfl: 3   Cholecalciferol (VITAMIN D-3) 125 MCG (5000 UT) TABS, Take by mouth daily., Disp: , Rfl:    Cyanocobalamin (B-12 PO), Take by mouth., Disp: , Rfl:    dicyclomine (BENTYL) 20 MG tablet, TAKE ONE TABLET BY MOUTH FOUR TIMES DAILY. TAKE BEFORE MEALS AND AT BEDTIME, Disp: 120 tablet, Rfl: 0   diphenhydrAMINE (BENADRYL) 25 mg capsule, Take 25 mg by mouth every 6 (six) hours as needed (for allergies.)., Disp: , Rfl:    DULoxetine (CYMBALTA) 20 MG capsule, Take 1 capsule (20 mg total) by mouth 2 (two) times daily., Disp: 180 capsule, Rfl: 3   EPINEPHrine (AUVI-Q) 0.3 mg/0.3 mL IJ SOAJ injection, Use as directed for severe allergic reaction, Disp: 2 Device, Rfl: 2   fluticasone (FLONASE) 50 MCG/ACT nasal spray, Place 2 sprays into both nostrils daily., Disp: 16 g, Rfl: 6   hydrochlorothiazide (HYDRODIURIL) 25 MG tablet, Take 1 tablet (25 mg total) by mouth daily., Disp: 30 tablet, Rfl: 0   hydrocortisone 2.5 % cream, Apply topically 2 (two) times daily. Prn to eyelids, Disp: 30 g, Rfl: 0   lidocaine 4 %, Place onto the skin., Disp: , Rfl:    losartan (COZAAR) 100 MG tablet, Take 1 tablet (100 mg total) by mouth daily., Disp: 90 tablet, Rfl:  3   meloxicam (MOBIC) 15 MG tablet, Take 1 tablet (15 mg total) by mouth every other day as needed., Disp: 45 tablet, Rfl: 11   methocarbamol (ROBAXIN) 750 MG tablet, Take 1 tablet (750 mg total) by mouth every 8 (eight) hours as needed for muscle spasms., Disp: 90 tablet, Rfl: 11   montelukast (SINGULAIR) 10 MG tablet, Take 1 tablet (10 mg total) by mouth at bedtime., Disp: 90 tablet, Rfl: 3   Olopatadine HCl 0.2 % SOLN, Apply 1 drop to eye daily. Both eyes, Disp: 2.5 mL, Rfl: 11   pantoprazole (PROTONIX) 40 MG tablet, TAKE ONE TABLET EVERY DAY 30 MIN BEFORE DINNER, Disp: 90 tablet, Rfl: 3   polyethylene glycol powder (GLYCOLAX/MIRALAX) 17 GM/SCOOP powder, Take 17 g by mouth 2 (two) times daily as needed., Disp: 3350 g, Rfl: 1   potassium chloride SA (KLOR-CON M) 20 MEQ tablet, Take 1 tablet (20 mEq total) by mouth daily as needed. With lasix, Disp: 30 tablet, Rfl: 11   pregabalin (LYRICA) 75 MG capsule, Take 1 capsule (75 mg total) by mouth 3 (three) times daily., Disp: 90 capsule, Rfl: 11   saccharomyces boulardii (FLORASTOR) 250 MG capsule, Take 1 capsule (250 mg total) by mouth daily., Disp: 90 capsule, Rfl: 0  Imaging Review  Cervical Imaging: Cervical MR wo contrast: Results for orders placed during the hospital encounter of 01/02/21  MR CERVICAL SPINE WO CONTRAST  Narrative CLINICAL DATA:  Chronic neck and bilateral arm pain.  EXAM: MRI CERVICAL SPINE WITHOUT CONTRAST  TECHNIQUE: Multiplanar, multisequence MR imaging of the cervical spine was performed. No intravenous contrast was administered.  COMPARISON:  Concurrent cervical radiographs. 06/10/2018 MRI and prior.  FINDINGS: Alignment: Straightening of lordosis.  Vertebrae: No fracture or aggressive osseous lesion. Sequela of C4-7 ACDF with associated susceptibility artifact.  Cord: Normal signal and morphology.  Posterior Fossa, vertebral arteries: Normal flow voids. Partially CSF filled sella turcica,  nonspecific.  Disc levels: Multilevel desiccation.  C2-3: Shallow central  protrusion and bilateral facet degenerative spurring. Patent spinal canal and neural foramen.  C3-4: Small central protrusion with uncovertebral and facet degenerative spurring. Patent spinal canal and right neural foramen. Mild left neural foraminal narrowing.  C4-5: Sequela of fusion. Patent right neural foramen. Mild spinal canal and left neural foraminal narrowing.  C5-6: Sequela of fusion. Patent spinal canal and right neural foramen. Minimal to mild left neural foraminal narrowing.  C6-7: Sequela of fusion. Patent spinal canal and right neural foramen. Left uncovertebral degenerative spurring with minimal neural foraminal narrowing.  C7-T1: Bilateral uncovertebral and facet degenerative spurring. Patent spinal canal. Minimal to mild bilateral neural foraminal narrowing.  Paraspinal tissues: Negative.  IMPRESSION: Sequela of C4-7 ACDF. Minimal to mild left neural foraminal narrowing at these levels.  Mild C4-5 spinal canal narrowing.  Mild left C3-4 and bilateral C7-T1 neural foraminal narrowing.   Electronically Signed By: Stana Bunting M.D. On: 01/02/2021 12:21  Cervical MR wo contrast: No valid procedures specified. Cervical MR w/wo contrast: No results found for this or any previous visit.  Cervical MR w contrast: No results found for this or any previous visit.  Cervical CT wo contrast: Results for orders placed during the hospital encounter of 06/10/18  CT CERVICAL SPINE WO CONTRAST  Narrative CLINICAL DATA:  Left-sided numbness since ACDF 1 year ago.  EXAM: CT CERVICAL SPINE WITHOUT CONTRAST  TECHNIQUE: Multidetector CT imaging of the cervical spine was performed without intravenous contrast. Multiplanar CT image reconstructions were also generated.  COMPARISON:  MRI cervical spine from same day. Cervical spine x-rays dated July 01, 2017.  FINDINGS: Alignment:  Normal.  Skull base and vertebrae: Prior C4-C7 ACDF with solid fusion. The right C7 screw and plate are slightly offset from the vertebral body. No acute fracture. No primary bone lesion or focal pathologic process.  Soft tissues and spinal canal: Normal prevertebral soft tissues.  Disc levels:  C2-C3:  Negative.  C3-C4: Tiny central disc protrusion. Mild bilateral uncovertebral hypertrophy with mild bilateral neuroforaminal stenosis.  C4-C5: Prior ACDF. Mild left neuroforaminal stenosis due to facet uncovertebral hypertrophy.  C5-C6: Prior ACDF. Mild bilateral facet uncovertebral hypertrophy. Mild left neuroforaminal stenosis.  C6-C7: Prior ACDF. Mild left facet uncovertebral hypertrophy. Mild left neuroforaminal stenosis.  C7-T1:  Negative.  Upper chest: Negative.  Other: None.  IMPRESSION: 1. Prior C4-C7 ACDF with solid fusion. The right C7 screw and plate are slightly offset from the vertebral body. 2. Mild residual left-sided neuroforaminal stenosis at the fused levels. 3. Mild bilateral neuroforaminal stenosis at C3-C4.   Electronically Signed By: Obie Dredge M.D. On: 06/10/2018 13:46  Cervical CT w/wo contrast: No results found for this or any previous visit.  Cervical CT w/wo contrast: No results found for this or any previous visit.  Cervical CT w contrast: No results found for this or any previous visit.  Cervical CT outside: No results found for this or any previous visit.  Cervical DG 1 view: No results found for this or any previous visit.  Cervical DG 2-3 views: Results for orders placed during the hospital encounter of 07/01/17  DG Cervical Spine 2 or 3 views  Narrative CLINICAL DATA:  Status post cervical spine fusion.  EXAM: CERVICAL SPINE - 2-3 VIEW  COMPARISON:  None  FINDINGS: Anterior C4 through C7 fusion with interbody spacers. C7-T1 alignment is not well evaluated due to overlying osseous and soft tissue structures.  Alignment otherwise maintained. Recent postsurgical change with air in the prevertebral soft tissues.  IMPRESSION: Post anterior C4  through C7 fusion with interbody spacers. No immediate postoperative complication.   Electronically Signed By: Rubye Oaks M.D. On: 07/01/2017 23:04  Cervical DG F/E views: No results found for this or any previous visit.  Cervical DG 2-3 clearing views: Results for orders placed during the hospital encounter of 01/02/21  DG Cervical Spine 2-3Vclearing  Narrative CLINICAL DATA:  Question of stimulator wire in lower back on 09/04/2020  EXAM: LIMITED CERVICAL SPINE FOR TRAUMA CLEARING - 2-3 VIEW  COMPARISON:  July 01, 2017  FINDINGS: Signs of multilevel plate and screw fixation of the cervical spine, ACDF without change compared to previous imaging. This spans C4 through C7.  No new radiopaque foreign bodies to indicate wires in the soft tissues of the neck. Alignment is unchanged.  Signs of associated degenerative change as before.  IMPRESSION: No change in the appearance of multilevel plate and screw fixation of cervical spine ACDF. No additional metallic structures in the neck or new finding since previous imaging.   Electronically Signed By: Donzetta Kohut M.D. On: 01/02/2021 09:43  Cervical DG Bending/F/E views: No results found for this or any previous visit.  Cervical DG complete: No results found for this or any previous visit.  Cervical DG Myelogram views: No results found for this or any previous visit.  Cervical DG Myelogram views: No results found for this or any previous visit.  Cervical Discogram views: No results found for this or any previous visit.   Shoulder Imaging: Shoulder-R MR w contrast: No results found for this or any previous visit.  Shoulder-L MR w contrast: No results found for this or any previous visit.  Shoulder-R MR w/wo contrast: No results found for this or any previous  visit.  Shoulder-L MR w/wo contrast: No results found for this or any previous visit.  Shoulder-R MR wo contrast: No results found for this or any previous visit.  Shoulder-L MR wo contrast: No results found for this or any previous visit.  Shoulder-R CT w contrast: No results found for this or any previous visit.  Shoulder-L CT w contrast: No results found for this or any previous visit.  Shoulder-R CT w/wo contrast: No results found for this or any previous visit.  Shoulder-L CT w/wo contrast: No results found for this or any previous visit.  Shoulder-R CT wo contrast: No results found for this or any previous visit.  Shoulder-L CT wo contrast: No results found for this or any previous visit.  Shoulder-R DG Arthrogram: No results found for this or any previous visit.  Shoulder-L DG Arthrogram: No results found for this or any previous visit.  Shoulder-R DG 1 view: No results found for this or any previous visit.  Shoulder-L DG 1 view: No results found for this or any previous visit.  Shoulder-R DG: No results found for this or any previous visit.  Shoulder-L DG: No results found for this or any previous visit.   Thoracic Imaging: Thoracic MR wo contrast: No results found for this or any previous visit.  Thoracic MR wo contrast: No valid procedures specified. Thoracic MR w/wo contrast: No results found for this or any previous visit.  Thoracic MR w contrast: No results found for this or any previous visit.  Thoracic CT wo contrast: No results found for this or any previous visit.  Thoracic CT w/wo contrast: No results found for this or any previous visit.  Thoracic CT w/wo contrast: No results found for this or any previous visit.  Thoracic CT w contrast:  No results found for this or any previous visit.  Thoracic DG 2-3 views: Results for orders placed during the hospital encounter of 01/02/21  DG Thoracic Spine 2 View  Narrative CLINICAL DATA:  For clearance  for upcoming MRI of the spine  EXAM: THORACIC SPINE 2 VIEWS  COMPARISON:  Previous pain clinic imaging from November of 2021  FINDINGS: There is no evidence of thoracic spine fracture. Alignment is normal. No other significant bone abnormalities are identified.  No radiopaque structures to indicate wires in the soft tissues of the posterior back or about the spine.  IMPRESSION: No acute findings and no unexpected metallic foreign bodies.   Electronically Signed By: Donzetta Kohut M.D. On: 01/02/2021 09:46  Thoracic DG 4 views: No results found for this or any previous visit.  Thoracic DG: No results found for this or any previous visit.  Thoracic DG w/swimmers view: No results found for this or any previous visit.  Thoracic DG Myelogram views: No results found for this or any previous visit.  Thoracic DG Myelogram views: No results found for this or any previous visit.   Lumbosacral Imaging: Lumbar MR wo contrast: Results for orders placed during the hospital encounter of 02/12/21  MR Lumbar Spine Wo Contrast  Narrative CLINICAL DATA:  Low back pain, prior surgery.  Neuropathy.  EXAM: MRI LUMBAR SPINE WITHOUT CONTRAST  TECHNIQUE: Multiplanar, multisequence MR imaging of the lumbar spine was performed. No intravenous contrast was administered.  COMPARISON:  Radiographs Jan 02, 2021  FINDINGS: Segmentation:  Standard.  Alignment:  Physiologic.  Vertebrae:  No fracture, evidence of discitis, or bone lesion.  Conus medullaris and cauda equina: Conus extends to the L1 level. Conus and cauda equina appear normal.  Paraspinal and other soft tissues: Right adnexal cyst measuring at least 3.5 cm, only partially imaged.  Disc levels:  T12-L1:No spinal canal or neural foraminal stenosis.  L1-2:No spinal canal or neural foraminal stenosis.  L2-3:No spinal canal or neural foraminal stenosis.  L3-4:Mild facet degenerative changes.No spinal canal or  neural foraminal stenosis.  L4-5:Mild facet degenerative changes.No spinal canal or neural foraminal stenosis.  L5-S1:Mild-to-moderate facet degenerative changes.No spinal canal or neural foraminal stenosis.  IMPRESSION: Mild degenerative changes of the lumbar spine at the level of the facet joints at L3-4, L4-5 and L5-S1. No high-grade spinal canal or neural foraminal stenosis at any level.   Electronically Signed By: Baldemar Lenis M.D. On: 02/12/2021 08:47  Lumbar MR wo contrast: No valid procedures specified. Lumbar MR w/wo contrast: No results found for this or any previous visit.  Lumbar MR w/wo contrast: No results found for this or any previous visit.  Lumbar MR w contrast: No results found for this or any previous visit.  Lumbar CT wo contrast: No results found for this or any previous visit.  Lumbar CT w/wo contrast: No results found for this or any previous visit.  Lumbar CT w/wo contrast: No results found for this or any previous visit.  Lumbar CT w contrast: No results found for this or any previous visit.  Lumbar DG 1V: No results found for this or any previous visit.  Lumbar DG 1V (Clearing): No results found for this or any previous visit.  Lumbar DG 2-3V (Clearing): Results for orders placed during the hospital encounter of 01/02/21  DG Lumbar Spine 2-3Vclearing  Narrative CLINICAL DATA:  Evaluate for retained stimulator wire.  EXAM: LIMITED LUMBAR SPINE- 2-3 VIEW  COMPARISON:  None.  FINDINGS: No retained wire or  other contraindicating finding relative to MRI. No incidental fracture or bone lesion.  IMPRESSION: No wire or other contraindication to MRI at the level of the lumbar spine.   Electronically Signed By: Marnee Spring M.D. On: 01/02/2021 09:38  Lumbar DG 2-3 views: No results found for this or any previous visit.  Lumbar DG (Complete) 4+V: No results found for this or any previous visit.  {Blank  single:19197::"Radiography is not sensitive for detection of early osteoarthritis of the facet joints. It can detect severe cases of facet arthrosis. Oblique views are preferred over standard AP and lateral views. Joint space narrowing, subchondral sclerosis, and osteophyte formation are common radiographic findings. These findings can be observed in this patient's oblique views.","     "} Lumbar DG F/E views: No results found for this or any previous visit.  {Blank single:19197::"Radiography is not sensitive for detection of early osteoarthritis of the facet joints. It can detect severe cases of facet arthrosis. Oblique views are preferred over standard AP and lateral views. Joint space narrowing, subchondral sclerosis, and osteophyte formation are common radiographic findings. These findings can be observed in this patient's oblique views.","     "} Lumbar DG Bending views: Results for orders placed during the hospital encounter of 09/07/18  DG Lumbar Spine Complete W/Bend  Narrative CLINICAL DATA:  52 year old female chronic low back pain. No injury.  EXAM: LUMBAR SPINE - COMPLETE WITH BENDING VIEWS  COMPARISON:  None.  FINDINGS: Slight curvature lumbar spine convex left. Normal mineralization. No compression fracture or pars defect.  No significant lumbar disc space narrowing. No abnormal motion between flexion and extension.  Minimal anterior spur T11-12 and L3-4 level.  IMPRESSION: 1. Slight curvature lumbar spine convex left. 2. No compression fracture or pars defect noted. 3. No significant lumbar disc space narrowing. 4. No abnormal motion between flexion and extension.   Electronically Signed By: Lacy Duverney M.D. On: 09/07/2018 15:31  {Blank single:19197::"Radiography is not sensitive for detection of early osteoarthritis of the facet joints. It can detect severe cases of facet arthrosis. Oblique views are preferred over standard AP and lateral views. Joint space  narrowing, subchondral sclerosis, and osteophyte formation are common radiographic findings. These findings can be observed in this patient's oblique views.","     "} Lumbar DG Myelogram views: No results found for this or any previous visit.  Lumbar DG Myelogram: No results found for this or any previous visit.  Lumbar DG Myelogram: No results found for this or any previous visit.  Lumbar DG Myelogram: No results found for this or any previous visit.  Lumbar DG Myelogram Lumbosacral: No results found for this or any previous visit.  Lumbar DG Diskogram views: No results found for this or any previous visit.  Lumbar DG Diskogram views: No results found for this or any previous visit.  Lumbar DG Epidurogram OP: No results found for this or any previous visit.  Lumbar DG Epidurogram IP: No valid procedures specified.  Sacroiliac Joint Imaging: Sacroiliac Joint DG: Results for orders placed during the hospital encounter of 09/07/18  DG Si Joints  Narrative CLINICAL DATA:  52 year old female with chronic lumbar pain. No injury. Initial encounter.  EXAM: BILATERAL SACROILIAC JOINTS - 3+ VIEW  COMPARISON:  Lumbar spine films same date.  FINDINGS: Mild sclerosis surrounds the sacroiliac joints bilaterally. No associated erosions. Otherwise negative.  IMPRESSION: Mild sclerosis surrounds the sacroiliac joints bilaterally. No associated erosions.   Electronically Signed By: Lacy Duverney M.D. On: 09/07/2018 15:32  Sacroiliac  Joint MR w/wo contrast: No results found for this or any previous visit.  Sacroiliac Joint MR wo contrast: No results found for this or any previous visit.   Spine Imaging: Whole Spine DG Myelogram views: No results found for this or any previous visit.  Whole Spine MR Mets screen: No results found for this or any previous visit.  Whole Spine MR Mets screen: No results found for this or any previous visit.  Whole Spine MR w/wo: No results found  for this or any previous visit.  MRA Spinal Canal w/ cm: No results found for this or any previous visit.  MRA Spinal Canal wo/ cm: No valid procedures specified. MRA Spinal Canal w/wo cm: No results found for this or any previous visit.  Spine Outside MR Films: No results found for this or any previous visit.  Spine Outside CT Films: No results found for this or any previous visit.  CT-Guided Biopsy: No results found for this or any previous visit.  CT-Guided Needle Placement: No results found for this or any previous visit.  DG Spine outside: No results found for this or any previous visit.  IR Spine outside: No results found for this or any previous visit.  NM Spine outside: No results found for this or any previous visit.   Hip Imaging: Hip-R MR w contrast: No results found for this or any previous visit.  Hip-L MR w contrast: No results found for this or any previous visit.  Hip-R MR w/wo contrast: No results found for this or any previous visit.  Hip-L MR w/wo contrast: No results found for this or any previous visit.  Hip-R MR wo contrast: No results found for this or any previous visit.  Hip-L MR wo contrast: No results found for this or any previous visit.  Hip-R CT w contrast: No results found for this or any previous visit.  Hip-L CT w contrast: No results found for this or any previous visit.  Hip-R CT w/wo contrast: No results found for this or any previous visit.  Hip-L CT w/wo contrast: No results found for this or any previous visit.  Hip-R CT wo contrast: No results found for this or any previous visit.  Hip-L CT wo contrast: No results found for this or any previous visit.  Hip-R DG 2-3 views: No results found for this or any previous visit.  Hip-L DG 2-3 views: No results found for this or any previous visit.  Hip-R DG Arthrogram: No results found for this or any previous visit.  Hip-L DG Arthrogram: No results found for this or any previous  visit.  Hip-B DG Bilateral: No results found for this or any previous visit.  Hip-B DG Bilateral (5V): No results found for this or any previous visit.   Knee Imaging: Knee-R MR w contrast: No results found for this or any previous visit.  Knee-L MR w/o contrast: No results found for this or any previous visit.  Knee-R MR w/wo contrast: No results found for this or any previous visit.  Knee-L MR w/wo contrast: No results found for this or any previous visit.  Knee-R MR wo contrast: No results found for this or any previous visit.  Knee-L MR wo contrast: No results found for this or any previous visit.  Knee-R CT w contrast: No results found for this or any previous visit.  Knee-L CT w contrast: No results found for this or any previous visit.  Knee-R CT w/wo contrast: No results found for this  or any previous visit.  Knee-L CT w/wo contrast: No results found for this or any previous visit.  Knee-R CT wo contrast: No results found for this or any previous visit.  Knee-L CT wo contrast: No results found for this or any previous visit.  Knee-R DG 1-2 views: No results found for this or any previous visit.  Knee-L DG 1-2 views: No results found for this or any previous visit.  Knee-R DG 3 views: No results found for this or any previous visit.  Knee-L DG 3 views: No results found for this or any previous visit.  Knee-R DG 4 views: No results found for this or any previous visit.  Knee-L DG 4 views: No results found for this or any previous visit.  Knee-R DG Arthrogram: No results found for this or any previous visit.  Knee-L DG Arthrogram: No results found for this or any previous visit.   Ankle Imaging: Ankle-R DG Complete: No results found for this or any previous visit.  Ankle-L DG Complete: No results found for this or any previous visit.   Foot Imaging: Foot-R DG Complete: Results for orders placed in visit on 12/06/19  DG Foot Complete  Right  Narrative CLINICAL DATA:  BILATERAL foot pain  EXAM: RIGHT FOOT COMPLETE - 3+ VIEW  COMPARISON:  None  FINDINGS: Small plantar and Achilles insertion calcaneal spurs.  Osseous mineralization normal.  No fracture, dislocation, or bone destruction.  IMPRESSION: Calcaneal spurring.  No acute osseous abnormalities.   Electronically Signed By: Ulyses Southward M.D. On: 12/07/2019 08:20  Foot-L DG Complete: Results for orders placed in visit on 12/06/19  DG Foot Complete Left  Narrative CLINICAL DATA:  Bilat pain  EXAM: LEFT FOOT - COMPLETE 3+ VIEW  COMPARISON:  05/31/2018  FINDINGS: Osseous mineralization normal.  Joint spaces preserved.  Small plantar and Achilles insertion calcaneal spurs.  Dorsal soft tissue swelling over the distal metatarsals.  No acute fracture, dislocation, or bone destruction.  IMPRESSION: Calcaneal spurring.  No acute osseous abnormalities.   Electronically Signed By: Ulyses Southward M.D. On: 12/07/2019 08:21   Elbow Imaging: Elbow-R DG Complete: No results found for this or any previous visit.  Elbow-L DG Complete: No results found for this or any previous visit.   Wrist Imaging: Wrist-R DG Complete: No results found for this or any previous visit.  Wrist-L DG Complete: No results found for this or any previous visit.   Hand Imaging: Hand-R DG Complete: No results found for this or any previous visit.  Hand-L DG Complete: No results found for this or any previous visit.   Complexity Note: {Blank single:19197::"No results found under the [facility] electronic medical record.","No new results found.","Imaging results reviewed. Results shared with Ms. Lenker, using [employer] results reviewed."} {Blank single:19197::"Today I personally and independently reviewed the study images pertinent to Ms. Mengel's problem.","      "} {Blank single:19197::"Results visible under [facility].","Results visible  under [facility].","Results visible under [facility].","Results visible under [facility].","Results visible under "Care Everywhere".","Results made available to patient.","Copy of results provided to patient.","     "} {Blank single:19197::"Images reviewed using the PACS system hyperlink.","    "} {Blank single:19197::"I have personally examined the images and I agree with the reported  findings. I find no additional pain-related pathology to add to the report.","I have personally examined the images. I agree with the Radiologist's report. The following observed findings are of concern to me:","    "}  ROS  Cardiovascular: {Hx;  Cardiovascular History:210120525} Pulmonary or Respiratory: {Hx; Pumonary and/or Respiratory History:210120523} Neurological: {Hx; Neurological:210120504} Psychological-Psychiatric: {Hx; Psychological-Psychiatric History:210120512} Gastrointestinal: {Hx; Gastrointestinal:210120527} Genitourinary: {Hx; Genitourinary:210120506} Hematological: {Hx; Hematological:210120510} Endocrine: {Hx; Endocrine history:210120509} Rheumatologic: {Hx; Rheumatological:210120530} Musculoskeletal: {Hx; Musculoskeletal:210120528} Work History: {Hx; Work history:210120514}  Allergies  Anita Gibbs is allergic to other, pineapple, shellfish allergy, strawberry (diagnostic), and tomato.  Laboratory Chemistry Profile   Renal Lab Results  Component Value Date   BUN 11 04/08/2023   CREATININE 0.86 04/08/2023   BCR NOT APPLICABLE 02/13/2021   GFR 79.15 04/08/2023   GFRAA 76 09/07/2018   GFRNONAA 66 09/07/2018   SPECGRAV 1.020 11/05/2018   PHUR 6.0 11/05/2018   PROTEINUR NEGATIVE 01/28/2022     Electrolytes Lab Results  Component Value Date   NA 139 04/08/2023   K 3.9 04/08/2023   CL 101 04/08/2023   CALCIUM 9.5 04/08/2023   MG 2.2 09/07/2018     Hepatic Lab Results  Component Value Date   AST 13 04/08/2023   ALT 12 04/08/2023   ALBUMIN 4.1 04/08/2023   ALKPHOS 60 04/08/2023   LIPASE  32.0 06/30/2022     ID Lab Results  Component Value Date   HIV NON-REACTIVE 06/11/2020   SARSCOV2NAA NOT DETECTED 02/25/2019   STAPHAUREUS NEGATIVE 06/25/2017   MRSAPCR NEGATIVE 06/25/2017   PREGTESTUR Negative 06/11/2020     Bone Lab Results  Component Value Date   VD25OH 29.35 (L) 04/08/2023   25OHVITD1 8.1 (L) 09/07/2018   25OHVITD2 <1.0 09/07/2018   25OHVITD3 8.1 09/07/2018     Endocrine Lab Results  Component Value Date   GLUCOSE 103 (H) 04/08/2023   GLUCOSEU NEGATIVE 06/30/2022   HGBA1C 5.8 04/08/2023   TSH 1.43 04/08/2023   FREET4 1.33 08/17/2018   UCORFRPERLTR 14 08/06/2020   UCORFRPERDAY 7.0 02/24/2022     Neuropathy Lab Results  Component Value Date   VITAMINB12 600 04/08/2023   FOLATE 7.8 04/09/2023   HGBA1C 5.8 04/08/2023   HIV NON-REACTIVE 06/11/2020     CNS No results found for: "COLORCSF", "APPEARCSF", "RBCCOUNTCSF", "WBCCSF", "POLYSCSF", "LYMPHSCSF", "EOSCSF", "PROTEINCSF", "GLUCCSF", "JCVIRUS", "CSFOLI", "IGGCSF", "LABACHR", "ACETBL"   Inflammation (CRP: Acute  ESR: Chronic) Lab Results  Component Value Date   CRP <1.0 01/24/2021   ESRSEDRATE 37 (H) 01/24/2021     Rheumatology Lab Results  Component Value Date   RF <14 01/24/2021   ANA POSITIVE (A) 01/24/2021   LABURIC 6.0 05/31/2018     Coagulation Lab Results  Component Value Date   INR 1.02 06/25/2017   LABPROT 13.3 06/25/2017   APTT 30 06/25/2017   PLT 280.0 04/08/2023     Cardiovascular Lab Results  Component Value Date   TROPONINI <0.03 06/03/2017   HGB 11.8 (L) 04/08/2023   HCT 36.7 04/08/2023     Screening Lab Results  Component Value Date   SARSCOV2NAA NOT DETECTED 02/25/2019   COVIDSOURCE NASOPHARYNGEAL 02/25/2019   STAPHAUREUS NEGATIVE 06/25/2017   MRSAPCR NEGATIVE 06/25/2017   HIV NON-REACTIVE 06/11/2020   PREGTESTUR Negative 06/11/2020     Cancer No results found for: "CEA", "CA125", "LABCA2"   Allergens No results found for: "ALMOND", "APPLE",  "ASPARAGUS", "AVOCADO", "BANANA", "BARLEY", "BASIL", "BAYLEAF", "GREENBEAN", "LIMABEAN", "WHITEBEAN", "BEEFIGE", "REDBEET", "BLUEBERRY", "BROCCOLI", "CABBAGE", "MELON", "CARROT", "CASEIN", "CASHEWNUT", "CAULIFLOWER", "CELERY"     Note: {Blank single:19197::"No results found under the [facility] electronic medical record","Results made available to patient.","Lab results reviewed and made available to patient.","Lab results reviewed and explained to patient in Layman's terms.","Lab results reviewed."}  PFSH  Drug: Anita Gibbs  reports  no history of drug use. Alcohol:  reports current alcohol use of about 2.0 standard drinks of alcohol per week. Tobacco:  reports that she has never smoked. She has never used smokeless tobacco. 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), Adrenal nodule (HCC) (06/20/2020), Allergy, ANA positive (02/01/2021), Anxiety and depression (01/29/2021), Arthritis, Back pain with history of spinal surgery (01/29/2021), Bilateral carpal tunnel syndrome (01/29/2021), Bilateral leg edema (01/29/2021), BMI 40.0-44.9, adult (HCC) (04/30/2022), Carpal boss, right, Carpal tunnel syndrome, bilateral, 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) (09/27/2018), Cervical facet hypertrophy (C3-T1) (09/27/2018), Cervical facet joint syndrome (Bilateral) (L>R) (09/27/2018), 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-5, C5-6, and C6-7) (09/27/2018), Cervical myelopathy (HCC) (07/01/2017), Cervicalgia (Primary Area of Pain) (Bilateral) (L>R) (09/27/2018), Chronic gout of foot (Left) (05/31/2018), Chronic low back pain (Tertiary Area of Pain) (Bilateral) (R>L) w/ sciatica (Bilateral) (09/07/2018), Chronic low back pain (Tertiary Area of Pain) (Bilateral) (R>L)  w/o sciatica (07/26/2018), Chronic lower extremity pain (Fourth Area of Pain) (Bilateral) (R>L) (09/07/2018), Chronic musculoskeletal pain (10/27/2018), Chronic neck pain (Bilateral) w/ history of cervical spinal surgery (09/27/2018), Chronic neck pain (Primary Area of Pain) (Bilateral) (L>R) (09/07/2018), Chronic sacroiliac joint dysfunction (Bilateral) (09/27/2018), Chronic sacroiliac joint pain (Right) (09/07/2018), Chronic upper extremity pain (Secondary Area of Pain) (Bilateral) (R>L) (09/27/2018), Disorder of skeletal system (09/07/2018), E. coli UTI (urinary tract infection) (06/20/2020), Elevated sed rate (09/08/2018), Elevated serum creatinine (06/20/2020), Excessive daytime sleepiness (03/06/2020), Female pelvic pain (09/05/2020), GERD (gastroesophageal reflux disease), Headache, History of allergy to shellfish (09/28/2018), History of fusion of cervical spine (ACDF C4-C7) (09/27/2018), History of illicit drug use (03/07/2020), Hives (09/05/2020), Hives (09/05/2020), Hyperkalemia (05/31/2018), Hyperlipidemia (04/30/2022), Hypertension, Large breasts (05/30/2020), Left hip pain (07/21/2022), Lumbar facet arthropathy (04/20/2019), Lumbar radiculitis (L5 dermatome) (Right) (04/17/2020), Multiple environmental allergies (01/17/2020), Multiple food allergies (03/07/2020), Neurogenic pain (10/27/2018), Numbness and tingling of both feet (05/31/2018), 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), Other intervertebral disc degeneration, 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), Pharmacologic therapy (09/07/2018), Plantar fasciitis, bilateral (03/04/2021), Polyneuropathy (01/29/2021), Prediabetes (10/10/2019), Problems influencing health status (09/07/2018), Rash (08/08/2019), Seasonal allergic rhinitis due to pollen (11/29/2018), Seasonal allergies (03/06/2020), Seasonal asthma (11/29/2018), Sleep apnea, Somatic dysfunction of sacroiliac joint (Bilateral) (09/27/2018), Spondylosis without myelopathy or  radiculopathy, cervical region (03/22/2019), Spondylosis without myelopathy or radiculopathy, lumbosacral region (05/19/2019), Spondylosis, cervical, 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 Family: family history includes [name] in her mother; Arthritis in her father, mother, and sister; Breast cancer in her maternal aunt and paternal aunt; COPD in her brother and son; Cancer in her brother; Depression in her son; Diabetes in her mother; Early death in her brother; Hearing loss in her father; Hyperlipidemia in her mother; Hypertension in her father, mother, and sister; Kidney disease in her father; Lymphoma in her father; Sleep apnea in her daughter.  Past Surgical History:  Procedure Laterality Date   ABDOMINAL HYSTERECTOMY  10/17/2020   [facility]; she has both ovaries   ANTERIOR CERVICAL DECOMP/DISCECTOMY FUSION N/A 07/01/2017   Procedure: ANTERIOR CERVICAL DECOMPRESSION/DISCECTOMY FUSION 3 LEVELS;  Surgeon: Venetia Night, MD;  Location: [facility] ORS;  Service: Neurosurgery;  Laterality: N/A;   CARPAL TUNNEL RELEASE Bilateral    KNEE ARTHROSCOPY Right    Active Ambulatory Problems    Diagnosis Date Noted   Essential hypertension 05/31/2018   Chronic pain syndrome 09/07/2018   DDD (degenerative disc disease), cervical 09/27/2018   Cervical spondylosis 09/27/2018  DDD (degenerative disc disease), lumbar 09/27/2018   Vitamin D deficiency 09/27/2018   Seasonal asthma 11/29/2018   Gastroesophageal reflux disease 09/06/2019   Fibroid uterus 09/06/2019   HLD (hyperlipidemia) 10/10/2019   Pain medication agreement broken 03/07/2020   Vaginal discharge 06/11/2020   Morbid obesity with BMI of 45.0-49.9, adult (HCC) 06/26/2020   Bilateral leg edema 01/29/2021   Lumbosacral radiculopathy at L5 01/29/2021   Neuropathy 01/29/2021   Allergic rhinitis 03/04/2021   Abdominal pain, left lower quadrant 06/30/2022   Chronic frontal sinusitis 10/08/2022   Morbid obesity (HCC)  04/24/2023   Abnormal glucose 04/24/2023   Mood disorder (HCC) 04/24/2023   Annual physical exam 04/24/2023   Seasonal allergies 04/24/2023   Anemia 04/24/2023   Resolved Ambulatory Problems    Diagnosis Date Noted   Cervical myelopathy (HCC) 07/01/2017   Chronic gout of foot (Left) 05/31/2018   Numbness and tingling of both feet 05/31/2018   Hyperkalemia 05/31/2018   Chronic low back pain [facility] Area of Pain) (Bilateral) (R>L) w/o sciatica 07/26/2018   Chronic neck pain (Primary Area of Pain) (Bilateral) (L>R) 09/07/2018   Chronic low back pain [facility] Area of Pain) (Bilateral) (R>L) w/ sciatica (Bilateral) 09/07/2018   Chronic lower extremity pain (Fourth Area of Pain) (Bilateral) (R>L) 09/07/2018   Pharmacologic therapy 09/07/2018   Disorder of skeletal system 09/07/2018   Problems influencing health status 09/07/2018   Chronic sacroiliac joint pain (Right) 09/07/2018   Elevated sed rate 09/08/2018   Chronic upper extremity pain (Secondary Area of Pain) (Bilateral) (R>L) 09/27/2018   Cervicalgia (Primary Area of Pain) (Bilateral) (L>R) 09/27/2018   History of fusion of cervical spine (ACDF C4-C7) 09/27/2018   Abnormal MRI, cervical spine (06/10/2018) 09/27/2018   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-5, C5-6, and C6-7) 09/27/2018   Cervical facet joint syndrome (Bilateral) (L>R) 09/27/2018   Cervical facet hypertrophy (C3-T1) 09/27/2018   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) 09/27/2018   Chronic sacroiliac joint dysfunction (Bilateral) 09/27/2018   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   Somatic dysfunction of sacroiliac joint (Bilateral) 09/27/2018   Chronic neck pain (Bilateral) w/ history of cervical spinal surgery 09/27/2018   Spondylosis, cervical, w/ myelopathy 09/27/2018   History of allergy to shellfish 09/28/2018   Neurogenic pain 10/27/2018   Chronic musculoskeletal pain 10/27/2018   Seasonal allergic rhinitis due to  pollen 11/29/2018   Spondylosis without myelopathy or radiculopathy, cervical region 03/22/2019   Lumbar facet arthropathy 04/20/2019   Spondylosis without myelopathy or radiculopathy, lumbosacral region 05/19/2019   Rash 08/08/2019   Prediabetes 10/10/2019   Other intervertebral disc degeneration, lumbar region 11/15/2019   Multiple environmental allergies 01/17/2020   Excessive daytime sleepiness 03/06/2020   Seasonal allergies 03/06/2020   Marijuana use 03/06/2020   Abnormal drug screen 03/06/2020   History of illicit drug use 03/07/2020   Multiple food allergies 03/07/2020   Lumbar radiculitis (L5 dermatome) (Right) 04/17/2020   Abdominal pain 06/11/2020   Acute vaginitis 06/11/2020   Acute cystitis without hematuria 06/18/2020   E. coli UTI (urinary tract infection) 06/20/2020   Elevated serum creatinine 06/20/2020   Adrenal nodule (HCC) 06/20/2020   Large breasts 05/30/2020   Adenoma of left adrenal gland 08/01/2020   Hives 09/05/2020   Female pelvic pain 09/05/2020   Back pain with history of spinal surgery 01/29/2021   Polyneuropathy 01/29/2021   Bilateral carpal tunnel syndrome 01/29/2021   Anxiety and depression 01/29/2021   ANA positive 02/01/2021   Plantar fasciitis, bilateral 03/04/2021   Hyperlipidemia  04/30/2022   BMI 40.0-44.9, adult (HCC) 04/30/2022   Left hip pain 07/21/2022   Past Medical History:  Diagnosis Date   Allergy    Arthritis    Carpal boss, right    Carpal tunnel syndrome, bilateral    GERD (gastroesophageal reflux disease)    Headache    Hypertension    Sleep apnea    Constitutional Exam  General appearance: {general exam:210120802::"Well nourished, well developed, and well hydrated. In no apparent acute distress"} Vitals:   06/11/23 0945  BP: (!) 142/75  Pulse: 60  Resp: 17  Temp: (!) 97.3 F (36.3 C)  SpO2: 100%  Weight: 232 lb (105.2 kg)  Height: 5\' 3"  (1.6 m)   BMI Assessment: Estimated body mass index is 41.1 kg/m as  calculated from the following:   Height as of this encounter: 5\' 3"  (1.6 m).   Weight as of this encounter: 232 lb (105.2 kg).  BMI interpretation table: BMI level Category Range association with higher incidence of chronic pain  <18 kg/m2 Underweight   18.5-24.9 kg/m2 Ideal body weight   25-29.9 kg/m2 Overweight Increased incidence by 20%  30-34.9 kg/m2 Obese (Class I) Increased incidence by 68%  35-39.9 kg/m2 Severe obesity (Class II) Increased incidence by 136%  >40 kg/m2 Extreme obesity (Class III) Increased incidence by 254%   Patient's current BMI Ideal Body weight  Body mass index is 41.1 kg/m. Ideal body weight: 52.4 kg (115 lb 8.3 oz) Adjusted ideal body weight: 73.5 kg (162 lb 1.8 oz)   BMI Readings from Last 4 Encounters:  06/11/23 41.10 kg/m  05/18/23 41.10 kg/m  04/08/23 43.31 kg/m  03/19/23 42.16 kg/m   Wt Readings from Last 4 Encounters:  06/11/23 232 lb (105.2 kg)  05/18/23 232 lb (105.2 kg)  04/08/23 244 lb 8 oz (110.9 kg)  03/19/23 238 lb (108 kg)    Psych/Mental status: {Blank single:19197::"Alert and oriented x 3. Exaggerated physical and/or psychosocial pain behavior perceived.","Alert, oriented x 3 (person, place, & time)"} {Blank single:19197::"Ms. Cross's speech pattern and demeanor seems to suggest oversedation","     "} Eyes: {Blank single:19197::"Miotic (pupilary constriction) due to opiate use","Midriatic","Anisocoric","Evidence of ptosis","Pin-point pupils","PERLA"} Respiratory: {Blank single:19197::"Oxygen-dependent COPD","No evidence of acute respiratory distress"}  Assessment  Primary Diagnosis & Pertinent Problem List: The primary encounter diagnosis was Lumbar facet arthropathy. Diagnoses of Lumbar facet syndrome (Bilateral) (R>L), Chronic low back pain (Tertiary Area of Pain) (Bilateral) (R>L) w/ sciatica (Bilateral), Neurogenic pain, Lumbar radiculitis (L5 dermatome) , and Chronic pain syndrome were also pertinent to this visit.  Visit  Diagnosis (New problems to examiner): 1. Lumbar facet arthropathy   2. Lumbar facet syndrome (Bilateral) (R>L)   3. Chronic low back pain [facility] Area of Pain) (Bilateral) (R>L) w/ sciatica (Bilateral)   4. Neurogenic pain   5. Lumbar radiculitis (L5 dermatome)    6. Chronic pain syndrome    Plan of Care (Initial workup plan)  Note: {Blank single:19197::"     ","Anita Gibbs has communicated to [place] that she is not interested in our services, at this time.","Anita Gibbs was reminded that as per protocol, today's visit has been an evaluation only. We have not taken over the patient's controlled substance management."}  Problem-specific plan: No problem-specific Assessment & Plan notes found for this encounter. Lab Orders  No laboratory test(s) ordered today    Imaging Orders         MR LUMBAR SPINE WO CONTRAST    Referral Orders  No referral(s) requested today   Procedure Orders  No procedure(s) ordered today   Pharmacotherapy (current): Medications ordered:  Meds ordered this encounter  Medications   buprenorphine (BUTRANS) 15 MCG/HR    Sig: Place 1 patch onto the skin once a week.    Dispense:  4 patch    Refill:  3    Chronic Pain: STOP Act (Not applicable) Fill 1 day early if closed on refill date. Avoid benzodiazepines within 8 hours of opioids   Medications administered during this visit: Nayomi C. Khurana had no medications administered during this visit.   Analgesic Pharmacotherapy:  Opioid Analgesics: For patients currently taking or requesting to take opioid analgesics, in accordance with [facility] Guidelines, we will assess their risks and indications for the use of these substances. After completing our evaluation, we may offer recommendations, but we no longer take patients for medication management. The prescribing physician will ultimately decide, based on his/her training and level of comfort whether to adopt any of the recommendations,  including whether or not to prescribe such medicines.  Membrane stabilizer: {Blank single:19197::"Not indicated","Medically contraindicated","Tried and failed","I will not be taking over Anita Gibbs medication regimen","Adequate regimen","To be determined at a later time"}  Muscle relaxant: {Blank single:19197::"Not indicated","Medically contraindicated","Tried and failed","I will not be taking over Anita Gibbs medication regimen","Adequate regimen","To be determined at a later time"}  NSAID: {Blank single:19197::"Not indicated","Medically contraindicated","Tried and failed","I will not be taking over Anita Gibbs medication regimen","Adequate regimen","To be determined at a later time"}  Other analgesic(s): {Blank single:19197::"Not indicated","Medically contraindicated","Tried and failed","I will not be taking over Ms. Bartosiewicz medication regimen","Adequate regimen","To be determined at a later time"}   Interventional management options: Ms. Rinn was informed that there is no guarantee that she would be a candidate for interventional therapies. The decision will be based on the results of diagnostic studies, as well as Ms. Strebeck's risk profile.  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  Future Appointments  Date Time Provider Department Center  07/22/2023 10:00 AM [name], NP [facility]  09/22/2023 10:40 AM Edward Jolly, MD [facility] None  02/23/2024  9:10 AM Shamleffer, Konrad Dolores, MD [facility] None    Duration of  encounter: *** minutes.  Total time on encounter, as per AMA guidelines included both the face-to-face and non-face-to-face time personally spent by the physician and/or other qualified health care professional(s) on the day of the encounter (includes time in activities that require the physician or other qualified health care professional and does not include time in activities normally performed by clinical staff). Physician's time may include the following activities when performed: Preparing to see the patient (e.g., pre-charting review of records, searching for previously ordered imaging, lab work, and nerve conduction tests) Review of prior analgesic pharmacotherapies. Reviewing PMP Interpreting ordered tests (e.g., lab work, imaging, nerve conduction tests) Performing post-procedure evaluations, including interpretation of diagnostic procedures Obtaining and/or reviewing separately obtained history Performing a medically appropriate examination and/or evaluation Counseling and educating the patient/family/caregiver Ordering medications, tests, or procedures Referring and communicating with other health care professionals (  when not separately reported) Documenting clinical information in the electronic or other health record Independently interpreting results (not separately reported) and communicating results to the patient/ family/caregiver Care coordination (not separately reported)  Note by: Edward Jolly, MD (TTS technology used. I apologize for any typographical errors that were not detected and corrected.) Date: 06/11/2023; Time: 10:13 AM

## 2023-06-11 NOTE — Progress Notes (Signed)
Nursing Pain Medication Assessment:  Safety precautions to be maintained throughout the outpatient stay will include: orient to surroundings, keep bed in low position, maintain call bell within reach at all times, provide assistance with transfer out of bed and ambulation.  Medication Inspection Compliance: Pill count conducted under aseptic conditions, in front of the patient. Neither the pills nor the bottle was removed from the patient's sight at any time. Once count was completed pills were immediately returned to the patient in their original bottle.  Medication: Buprenorphine (Suboxone) Pill/Patch Count:  0 of 4 patches remain Pill/Patch Appearance: Markings consistent with prescribed medication Bottle Appearance: Standard pharmacy container. Clearly labeled. Filled Date: 7 / 15 / 2024 Last Medication intake:  Ran out of medicine more than 48 hours ago

## 2023-06-11 NOTE — Progress Notes (Signed)
PROVIDER NOTE: Information contained herein reflects review and annotations entered in association with encounter. Interpretation of such information and data should be left to medically-trained personnel. Information provided to patient can be located elsewhere in the medical record under "Patient Instructions". Document created using STT-dictation technology, any transcriptional errors that may result from process are unintentional.    Patient: Anita Gibbs  Service Category: E/M  Provider: Edward Jolly, MD  DOB: 1970-09-18  DOS: 06/11/2023  Referring Provider: Dana Allan, MD  MRN: [id number]  Specialty: Interventional Pain Management  PCP: Dana Allan, MD  Type: Established Patient  Setting: Ambulatory outpatient    Location: Office  Delivery: Face-to-face     HPI  [name], a 52 y.o. year old female, is here today because of her Lumbar facet arthropathy [M47.816]. Ms. Stair primary complain today is Back Pain (lower)  Pertinent problems: Ms. Furniss has Morbid obesity with BMI of 45.0-49.9, adult [facility]) on their pertinent problem list. Pain Assessment: Severity of Chronic pain is reported as a 9 /10. Location: Back Lower/down left leg to tops of left toes. Onset: More than a month ago. Quality: Constant, Stabbing ("punching" pain). Timing: Constant. Modifying factor(s): heat, ice, meds. Vitals:  height is 5\' 3"  (1.6 m) and weight is 232 lb (105.2 kg). Her temperature is 97.3 F (36.3 C) (abnormal). Her blood pressure is 142/75 (abnormal) and her pulse is 60. Her respiration is 17 and oxygen saturation is 100%.  BMI: Estimated body mass index is 41.1 kg/m as calculated from the following:   Height as of this encounter: 5\' 3"  (1.6 m).   Weight as of this encounter: 232 lb (105.2 kg). Last encounter: 03/19/2023. Last procedure: 05/18/2023.  Reason for encounter: both, medication management and post-procedure evaluation and assessment.   Limited response to lumbar  radiofrequency ablation. Patient states that her low back pain is getting worse and is radiating into bilateral hips. She continues on Butrans patch at 10 mcg an hour.  We discussed titrating dose up to 15 mcg an hour. I also recommend a lumbar MRI for further evaluation.  Post-procedure evaluation   Procedure: Lumbar Facet, Medial Branch Radiofrequency Ablation (RFA) #2  Laterality: Left (-LT)  Level: L3, L4, and L5 Medial Branch Level(s). These levels will denervate the L3-4 and L4-5 lumbar facet joints.  Imaging: Fluoroscopy-guided         Anesthesia: Local anesthesia (1-2% Lidocaine) Sedation: Moderate Sedation                       DOS: 05/18/2023  Performed by: Edward Jolly, MD  Purpose: Therapeutic/Palliative Indications: Low back pain severe enough to impact quality of life or function. Indications: 1. Lumbar facet arthropathy   2. Lumbar facet syndrome (Bilateral) (R>L)    Ms. Macha has been dealing with the above chronic pain for longer than three months and has either failed to respond, was unable to tolerate, or simply did not get enough benefit from other more conservative therapies including, but not limited to: 1. Over-the-counter medications 2. Anti-inflammatory medications 3. Muscle relaxants 4. Membrane stabilizers 5. Opioids 6. Physical therapy and/or chiropractic manipulation 7. Modalities (Heat, ice, etc.) 8. Invasive techniques such as nerve blocks. Ms. Franzone has attained more than 50% relief of the pain from a series of diagnostic injections conducted in separate occasions.  Pain Score: Pre-procedure: 10-Worst pain ever/10 Post-procedure: 0-No pain/10     Effectiveness:  Initial hour after procedure: 80 %  Subsequent 4-6 hours post-procedure:  80 %  Analgesia past initial 6 hours: 0 % (pain returned on day 4)  Ongoing improvement:  Analgesic:  0%    Pharmacotherapy Assessment  Analgesic: Butrans patch 10 mcg an hour to 15 mcg an  hour  Monitoring: [employer] PMP: PDMP reviewed during this encounter.       Pharmacotherapy: No side-effects or adverse reactions reported. Compliance: No problems identified. Effectiveness: Clinically acceptable.  Nonah Mattes, RN  06/11/2023  9:53 AM  Sign when Signing Visit Nursing Pain Medication Assessment:  Safety precautions to be maintained throughout the outpatient stay will include: orient to surroundings, keep bed in low position, maintain call bell within reach at all times, provide assistance with transfer out of bed and ambulation.  Medication Inspection Compliance: Pill count conducted under aseptic conditions, in front of the patient. Neither the pills nor the bottle was removed from the patient's sight at any time. Once count was completed pills were immediately returned to the patient in their original bottle.  Medication: Buprenorphine (Suboxone) Pill/Patch Count:  0 of 4 patches remain Pill/Patch Appearance: Markings consistent with prescribed medication Bottle Appearance: Standard pharmacy container. Clearly labeled. Filled Date: 7 / 24 / 2024 Last Medication intake:  Ran out of medicine more than 48 hours ago  No results found for: "CBDTHCR" No results found for: "D8THCCBX" No results found for: "D9THCCBX"  UDS:  Summary  Date Value Ref Range Status  02/01/2020 Note  Final    Comment:    ==================================================================== [employer] Select 13 (MW) ==================================================================== Test                             Result       Flag       Units Drug Present not Declared for Prescription Verification   Carboxy-THC                    20           UNEXPECTED ng/mg creat    Carboxy-THC is a metabolite of tetrahydrocannabinol (THC). Source of    THC is most commonly herbal marijuana or marijuana-based products,    but THC is also present in a scheduled prescription medication.    Trace amounts of THC can  be present in hemp and cannabidiol (CBD)    products. This test is not intended to distinguish between delta-9-    tetrahydrocannabinol, the predominant form of THC in most herbal or    marijuana-based products, and delta-8-tetrahydrocannabinol. Drug Absent but Declared for Prescription Verification   Oxycodone                      Not Detected UNEXPECTED ng/mg creat ==================================================================== Test                      Result    Flag   Units      Ref Range   Creatinine              230              mg/dL      >=74 ==================================================================== Declared Medications:  The flagging and interpretation on this report are based on the  following declared medications.  Unexpected results may arise from  inaccuracies in the declared medications.  **Note: The testing scope of this panel includes these medications:  Oxycodone (Roxicodone)  **Note: The testing scope of this panel does not include  the  following reported medications:  Albuterol (Ventolin HFA)  Amlodipine (Norvasc)  Atorvastatin (Lipitor)  Cetirizine  Diphenhydramine (Benadryl)  Epinephrine (EpiPen)  Fluticasone (Flonase)  Gabapentin (Neurontin)  Hydrochlorothiazide  Losartan (Cozaar)  Meloxicam (Mobic)  Methocarbamol (Robaxin)  Montelukast (Singulair)  Norethindrone (Aygestin)  Pantoprazole (Protonix)  Vitamin D3 ==================================================================== For clinical consultation, please call [phone]. ====================================================================       ROS  Constitutional: Denies any fever or chills Gastrointestinal: No reported hemesis, hematochezia, vomiting, or acute GI distress Musculoskeletal:  Low back pain Neurological: No reported episodes of acute onset apraxia, aphasia, dysarthria, agnosia, amnesia, paralysis, loss of coordination, or loss of consciousness  Medication  Review  Cyanocobalamin, DULoxetine, EPINEPHrine, Olopatadine HCl, Vitamin D-3, albuterol, atorvastatin, buprenorphine, cetirizine, dicyclomine, diphenhydrAMINE, fluticasone, hydrochlorothiazide, hydrocortisone, lidocaine, losartan, meloxicam, methocarbamol, montelukast, pantoprazole, polyethylene glycol powder, potassium chloride SA, pregabalin, and saccharomyces boulardii  History Review  Allergy: Ms. Tramontano is allergic to other, pineapple, shellfish allergy, strawberry (diagnostic), and tomato. Drug: Ms. Ha  reports no history of drug use. Alcohol:  reports current alcohol use of about 2.0 standard drinks of alcohol per week. Tobacco:  reports that she has never smoked. She has never used smokeless tobacco. Social: Ms. Bartelme  reports that she has never smoked. She has never used smokeless tobacco. She reports current alcohol use of about 2.0 standard drinks of alcohol per week. She reports that she does not use drugs. 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), Adrenal nodule (HCC) (06/20/2020), Allergy, ANA positive (02/01/2021), Anxiety and depression (01/29/2021), Arthritis, Back pain with history of spinal surgery (01/29/2021), Bilateral carpal tunnel syndrome (01/29/2021), Bilateral leg edema (01/29/2021), BMI 40.0-44.9, adult (HCC) (04/30/2022), Carpal boss, right, Carpal tunnel syndrome, bilateral, 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) (09/27/2018), Cervical facet hypertrophy (C3-T1) (09/27/2018), Cervical facet joint syndrome (Bilateral) (L>R) (09/27/2018), 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-5, C5-6, and C6-7) (09/27/2018), Cervical myelopathy (HCC) (07/01/2017), Cervicalgia (Primary Area of Pain) (Bilateral) (L>R) (09/27/2018),  Chronic gout of foot (Left) (05/31/2018), Chronic low back pain (Tertiary Area of Pain) (Bilateral) (R>L) w/ sciatica (Bilateral) (09/07/2018), Chronic low back pain (Tertiary Area of Pain) (Bilateral) (R>L) w/o sciatica (07/26/2018), Chronic lower extremity pain (Fourth Area of Pain) (Bilateral) (R>L) (09/07/2018), Chronic musculoskeletal pain (10/27/2018), Chronic neck pain (Bilateral) w/ history of cervical spinal surgery (09/27/2018), Chronic neck pain (Primary Area of Pain) (Bilateral) (L>R) (09/07/2018), Chronic sacroiliac joint dysfunction (Bilateral) (09/27/2018), Chronic sacroiliac joint pain (Right) (09/07/2018), Chronic upper extremity pain (Secondary Area of Pain) (Bilateral) (R>L) (09/27/2018), Disorder of skeletal system (09/07/2018), E. coli UTI (urinary tract infection) (06/20/2020), Elevated sed rate (09/08/2018), Elevated serum creatinine (06/20/2020), Excessive daytime sleepiness (03/06/2020), Female pelvic pain (09/05/2020), GERD (gastroesophageal reflux disease), Headache, History of allergy to shellfish (09/28/2018), History of fusion of cervical spine (ACDF C4-C7) (09/27/2018), History of illicit drug use (03/07/2020), Hives (09/05/2020), Hives (09/05/2020), Hyperkalemia (05/31/2018), Hyperlipidemia (04/30/2022), Hypertension, Large breasts (05/30/2020), Left hip pain (07/21/2022), Lumbar facet arthropathy (04/20/2019), Lumbar radiculitis (L5 dermatome) (Right) (04/17/2020), Multiple environmental allergies (01/17/2020), Multiple food allergies (03/07/2020), Neurogenic pain (10/27/2018), Numbness and tingling of both feet (05/31/2018), 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), Other intervertebral disc degeneration, 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), Pharmacologic therapy (09/07/2018), Plantar fasciitis, bilateral (03/04/2021), Polyneuropathy (01/29/2021), Prediabetes (10/10/2019), Problems influencing health status (09/07/2018), Rash (08/08/2019), Seasonal allergic rhinitis due to  pollen (11/29/2018), Seasonal allergies (03/06/2020), Seasonal asthma (11/29/2018), Sleep apnea, Somatic dysfunction  of sacroiliac joint (Bilateral) (09/27/2018), Spondylosis without myelopathy or radiculopathy, cervical region (03/22/2019), Spondylosis without myelopathy or radiculopathy, lumbosacral region (05/19/2019), Spondylosis, cervical, 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 Surgical: Ms. Filosa  has a past surgical history that includes Knee arthroscopy (Right); Anterior cervical decomp/discectomy fusion (N/A, 07/01/2017); Abdominal hysterectomy (10/17/2020); and Carpal tunnel release (Bilateral). Family: family history includes [name] in her mother; Arthritis in her father, mother, and sister; Breast cancer in her maternal aunt and paternal aunt; COPD in her brother and son; Cancer in her brother; Depression in her son; Diabetes in her mother; Early death in her brother; Hearing loss in her father; Hyperlipidemia in her mother; Hypertension in her father, mother, and sister; Kidney disease in her father; Lymphoma in her father; Sleep apnea in her daughter.  Laboratory Chemistry Profile   Renal Lab Results  Component Value Date   BUN 11 04/08/2023   CREATININE 0.86 04/08/2023   BCR NOT APPLICABLE 02/13/2021   GFR 79.15 04/08/2023   GFRAA 76 09/07/2018   GFRNONAA 66 09/07/2018    Hepatic Lab Results  Component Value Date   AST 13 04/08/2023   ALT 12 04/08/2023   ALBUMIN 4.1 04/08/2023   ALKPHOS 60 04/08/2023   LIPASE 32.0 06/30/2022    Electrolytes Lab Results  Component Value Date   NA 139 04/08/2023   K 3.9 04/08/2023   CL 101 04/08/2023   CALCIUM 9.5 04/08/2023   MG 2.2 09/07/2018    Bone Lab Results  Component Value Date   VD25OH 29.35 (L) 04/08/2023   25OHVITD1 8.1 (L) 09/07/2018   25OHVITD2 <1.0 09/07/2018   25OHVITD3 8.1 09/07/2018    Inflammation (CRP: Acute Phase) (ESR: Chronic Phase) Lab Results  Component Value Date   CRP <1.0  01/24/2021   ESRSEDRATE 37 (H) 01/24/2021         Note: Above Lab results reviewed.   Physical Exam  General appearance: Well nourished, well developed, and well hydrated. In no apparent acute distress Mental status: Alert, oriented x 3 (person, place, & time)       Respiratory: No evidence of acute respiratory distress Eyes: PERLA Vitals: BP (!) 142/75   Pulse 60   Temp (!) 97.3 F (36.3 C)   Resp 17   Ht 5\' 3"  (1.6 m)   Wt 232 lb (105.2 kg)   LMP  (LMP Unknown)   SpO2 100%   BMI 41.10 kg/m  BMI: Estimated body mass index is 41.1 kg/m as calculated from the following:   Height as of this encounter: 5\' 3"  (1.6 m).   Weight as of this encounter: 232 lb (105.2 kg). Ideal: Ideal body weight: 52.4 kg (115 lb 8.3 oz) Adjusted ideal body weight: 73.5 kg (162 lb 1.8 oz)  Physical Exam  General appearance: Well nourished, well developed, and well hydrated. In no apparent acute distress Mental status: Alert, oriented x 3 (person, place, & time)       Respiratory: No evidence of acute respiratory distress Eyes: PERLA Vitals: BP 136/77   Pulse (!) 58   Temp 98 F (36.7 C)   Resp 16   Ht 5\' 3"  (1.6 m)   Wt 238 lb (108 kg)   LMP  (LMP Unknown)   SpO2 100%   BMI 42.16 kg/m  BMI: Estimated body mass index is 42.16 kg/m as calculated from the following:   Height as of this encounter: 5\' 3"  (1.6 m).   Weight as of this encounter: 238 lb (108 kg). Ideal: Ideal  body weight: 52.4 kg (115 lb 8.3 oz) Adjusted ideal body weight: 74.6 kg (164 lb 8.2 oz)   Lumbar Spine Area Exam  Skin & Axial Inspection: No masses, redness, or swelling Alignment: Symmetrical Functional ROM: Pain restricted ROM affecting primarily the left Stability: No instability detected Muscle Tone/Strength: Functionally intact. No obvious neuro-muscular anomalies detected. Sensory (Neurological): Musculoskeletal pain pattern, facet mediated  Palpation: Complains of area being tender to palpation Left Fist  Percussion Test Provocative Tests: Hyperextension/rotation test: (+) on the left for facet joint pain. Lumbar quadrant test (Kemp's test): (+) on the left for facet joint pain.   Gait & Posture Assessment  Ambulation: Unassisted Gait: Relatively normal for age and body habitus Posture: WNL  Lower Extremity Exam      Side: Right lower extremity   Side: Left lower extremity  Stability: No instability observed           Stability: No instability observed          Skin & Extremity Inspection: Skin color, temperature, and hair growth are WNL. No peripheral edema or cyanosis. No masses, redness, swelling, asymmetry, or associated skin lesions. No contractures.   Skin & Extremity Inspection: Skin color, temperature, and hair growth are WNL. No peripheral edema or cyanosis. No masses, redness, swelling, asymmetry, or associated skin lesions. No contractures.  Functional ROM: Unrestricted ROM                   Functional ROM: Unrestricted ROM                  Muscle Tone/Strength: Functionally intact. No obvious neuro-muscular anomalies detected.   Muscle Tone/Strength: Functionally intact. No obvious neuro-muscular anomalies detected.  Sensory (Neurological): Unimpaired         Sensory (Neurological): Unimpaired        DTR: Patellar: deferred today Achilles: deferred today Plantar: deferred today   DTR: Patellar: deferred today Achilles: deferred today Plantar: deferred today  Palpation: No palpable anomalies   Palpation: No palpable anomalies    Assessment   Diagnosis Status  1. Lumbar facet arthropathy   2. Lumbar facet syndrome (Bilateral) (R>L)   3. Chronic low back pain [facility] Area of Pain) (Bilateral) (R>L) w/ sciatica (Bilateral)   4. Neurogenic pain   5. Lumbar radiculitis (L5 dermatome)    6. Chronic pain syndrome    Deteriorating Deteriorating Deteriorating     Plan of Care   [name] has a current medication list which includes the following long-term  medication(s): albuterol, atorvastatin, cetirizine, dicyclomine, diphenhydramine, duloxetine, fluticasone, hydrochlorothiazide, losartan, methocarbamol, montelukast, pantoprazole, potassium chloride sa, and pregabalin.  Pharmacotherapy (Medications Ordered): Meds ordered this encounter  Medications   DISCONTD: buprenorphine (BUTRANS) 15 MCG/HR    Sig: Place 1 patch onto the skin once a week.    Dispense:  4 patch    Refill:  3    Chronic Pain: STOP Act (Not applicable) Fill 1 day early if closed on refill date. Avoid benzodiazepines within 8 hours of opioids   buprenorphine (BUTRANS) 15 MCG/HR    Sig: Place 1 patch onto the skin once a week.    Dispense:  4 patch    Refill:  3    Chronic Pain: STOP Act (Not applicable) Fill 1 day early if closed on refill date. Avoid benzodiazepines within 8 hours of opioids   Orders:  Orders Placed This Encounter  Procedures   MR LUMBAR SPINE WO CONTRAST    Patient presents with axial  pain with possible radicular component. Please assist [place] in identifying specific level(s) and laterality of any additional findings such as: 1. Facet (Zygapophyseal) joint DJD (Hypertrophy, space narrowing, subchondral sclerosis, and/or osteophyte formation) 2. DDD and/or IVDD (Loss of disc height, desiccation, gas patterns, osteophytes, endplate sclerosis, or "Black disc disease") 3. Pars defects 4. Spondylolisthesis, spondylosis, and/or spondyloarthropathies (include Degree/Grade of displacement in mm) (stability) 5. Vertebral body Fractures (acute/chronic) (state percentage of collapse) 6. Demineralization (osteopenia/osteoporotic) 7. Bone pathology 8. Foraminal narrowing  9. Surgical changes 10. Central, Lateral Recess, and/or Foraminal Stenosis (include AP diameter of stenosis in mm) 11. Surgical changes (hardware type, status, and presence of fibrosis) 12. Modic Type Changes (MRI only) 13. IVDD (Disc bulge, protrusion, herniation, extrusion) (Level, laterality,  extent)    Standing Status:   Future    Standing Expiration Date:   07/12/2023    Scheduling Instructions:     Please make sure that the patient understands that this needs to be done as soon as possible. Never have the patient do the imaging "just before the next appointment". Inform patient that having the imaging done within the [facility] will expedite the availability of the results and will provide      imaging availability to the requesting physician. In addition inform the patient that the imaging order has an expiration date and will not be renewed if not done within the active period.    Order Specific Question:   What is the patient's sedation requirement?    Answer:   No Sedation    Order Specific Question:   Does the patient have a pacemaker or implanted devices?    Answer:   No    Order Specific Question:   Preferred imaging location?    Answer:   [facility] (table limit-350lbs)    Order Specific Question:   Call Results- Best Contact Number?    Answer:   [phone] [facility])    Order Specific Question:   Radiology Contrast Protocol - do NOT remove file path    Answer:   [email or web address]   Follow-up plan:   Return in about 15 weeks (around 09/24/2023) for MM, F2F.      Recent Visits Date Type Provider Dept  05/18/23 Procedure visit Edward Jolly, MD [facility]  03/19/23 Office Visit Edward Jolly, MD [facility]  Showing recent visits within past 90 days and meeting all other requirements Today's Visits Date Type Provider Dept  06/11/23 Office Visit Edward Jolly, MD [facility]  Showing today's visits and meeting all other requirements Future Appointments No visits were found meeting these conditions. Showing future appointments within next 90 days and meeting all other requirements  I discussed the assessment and treatment plan with the patient. The patient was provided an  opportunity to ask questions and all were answered. The patient agreed with the plan and demonstrated an understanding of the instructions.  Patient advised to call back or seek an in-person evaluation if the symptoms or condition worsens.  Duration of encounter: .  Total time on encounter, as per AMA guidelines included both the face-to-face and non-face-to-face time personally spent by the physician and/or other qualified health care professional(s) on the day of the encounter (includes time in activities that require the physician or other qualified health care professional and does not include time in activities normally performed by clinical staff). Physician's time may include the following activities when performed: Preparing to see the patient (e.g., pre-charting review of records, searching  for previously ordered imaging, lab work, and nerve conduction tests) Review of prior analgesic pharmacotherapies. Reviewing PMP Interpreting ordered tests (e.g., lab work, imaging, nerve conduction tests) Performing post-procedure evaluations, including interpretation of diagnostic procedures Obtaining and/or reviewing separately obtained history Performing a medically appropriate examination and/or evaluation Counseling and educating the patient/family/caregiver Ordering medications, tests, or procedures Referring and communicating with other health care professionals (when not separately reported) Documenting clinical information in the electronic or other health record Independently interpreting results (not separately reported) and communicating results to the patient/ family/caregiver Care coordination (not separately reported)  Note by: Edward Jolly, MD Date: 06/11/2023; Time: 11:40 AM

## 2023-06-12 ENCOUNTER — Telehealth: Payer: Self-pay

## 2023-06-12 ENCOUNTER — Other Ambulatory Visit (HOSPITAL_COMMUNITY): Payer: Self-pay

## 2023-06-12 NOTE — Telephone Encounter (Signed)
Pharmacy Patient Advocate Encounter   Received notification from CoverMyMeds that prior authorization for Pantoprazole Sodium 40MG  dr tablets is required/requested.   Insurance verification completed.   The patient is insured through [facility]   Per test claim: APPROVED from 06/11/23 to 06/11/24. Ran test claim, Copay is $1.55. This test claim was processed through [facility]- copay amounts may vary at other pharmacies due to pharmacy/plan contracts, or as the patient moves through the different stages of their insurance plan.   Pharmacy aware

## 2023-06-24 ENCOUNTER — Encounter: Payer: Self-pay | Admitting: Student in an Organized Health Care Education/Training Program

## 2023-06-25 ENCOUNTER — Telehealth: Payer: Self-pay | Admitting: Student in an Organized Health Care Education/Training Program

## 2023-06-25 ENCOUNTER — Telehealth: Payer: Self-pay

## 2023-06-25 NOTE — Telephone Encounter (Signed)
Patient notified that both pregabalin and Butrans had been approved and were redy for pick up.

## 2023-06-25 NOTE — Telephone Encounter (Signed)
PT called states that [facility] [address] also needs a PA to be send in for [place] prescription.

## 2023-06-25 NOTE — Telephone Encounter (Signed)
PA done

## 2023-06-25 NOTE — Telephone Encounter (Signed)
PT stated that the pharmacy needs PA for her pregabalin prescription. Please give patient a call. TY

## 2023-06-30 ENCOUNTER — Encounter: Payer: Self-pay | Admitting: Student in an Organized Health Care Education/Training Program

## 2023-07-01 ENCOUNTER — Telehealth: Payer: Self-pay | Admitting: Family Medicine

## 2023-07-01 NOTE — Telephone Encounter (Signed)
Lft pt vm to call ofc to f/u on referral. thanks

## 2023-07-02 DIAGNOSIS — Z4789 Encounter for other orthopedic aftercare: Secondary | ICD-10-CM | POA: Diagnosis not present

## 2023-07-02 DIAGNOSIS — M25532 Pain in left wrist: Secondary | ICD-10-CM | POA: Diagnosis not present

## 2023-07-02 DIAGNOSIS — M654 Radial styloid tenosynovitis [de Quervain]: Secondary | ICD-10-CM | POA: Diagnosis not present

## 2023-07-02 NOTE — Telephone Encounter (Signed)
Patient states she is returning a call from [employer].  I spoke with Rasheedah and transferred call to her.

## 2023-07-04 ENCOUNTER — Ambulatory Visit
Admission: RE | Admit: 2023-07-04 | Discharge: 2023-07-04 | Disposition: A | Discharge status: Home / Self Care | Payer: 59 | Source: Ambulatory Visit | Attending: Student in an Organized Health Care Education/Training Program | Admitting: Student in an Organized Health Care Education/Training Program

## 2023-07-04 DIAGNOSIS — M47816 Spondylosis without myelopathy or radiculopathy, lumbar region: Secondary | ICD-10-CM

## 2023-07-04 DIAGNOSIS — M4727 Other spondylosis with radiculopathy, lumbosacral region: Secondary | ICD-10-CM | POA: Diagnosis not present

## 2023-07-04 DIAGNOSIS — G894 Chronic pain syndrome: Secondary | ICD-10-CM

## 2023-07-04 DIAGNOSIS — M4726 Other spondylosis with radiculopathy, lumbar region: Secondary | ICD-10-CM | POA: Diagnosis not present

## 2023-07-04 DIAGNOSIS — M5416 Radiculopathy, lumbar region: Secondary | ICD-10-CM

## 2023-07-04 DIAGNOSIS — M792 Neuralgia and neuritis, unspecified: Secondary | ICD-10-CM

## 2023-07-08 ENCOUNTER — Encounter: Payer: Self-pay | Admitting: Student in an Organized Health Care Education/Training Program

## 2023-07-08 ENCOUNTER — Telehealth: Payer: Self-pay

## 2023-07-08 NOTE — Telephone Encounter (Signed)
MRI results read to Patient with recommendations from Dr Cherylann Ratel.

## 2023-07-21 ENCOUNTER — Other Ambulatory Visit: Payer: Self-pay | Admitting: Student in an Organized Health Care Education/Training Program

## 2023-07-22 ENCOUNTER — Encounter: Payer: Self-pay | Admitting: Nurse Practitioner

## 2023-07-22 ENCOUNTER — Ambulatory Visit (INDEPENDENT_AMBULATORY_CARE_PROVIDER_SITE_OTHER): Payer: 59 | Admitting: Nurse Practitioner

## 2023-07-22 VITALS — BP 132/86 | HR 61 | Temp 97.6°F | Ht 63.0 in | Wt 250.4 lb

## 2023-07-22 DIAGNOSIS — G894 Chronic pain syndrome: Secondary | ICD-10-CM | POA: Diagnosis not present

## 2023-07-22 DIAGNOSIS — R1030 Lower abdominal pain, unspecified: Secondary | ICD-10-CM

## 2023-07-22 DIAGNOSIS — Z23 Encounter for immunization: Secondary | ICD-10-CM

## 2023-07-22 DIAGNOSIS — E785 Hyperlipidemia, unspecified: Secondary | ICD-10-CM | POA: Diagnosis not present

## 2023-07-22 DIAGNOSIS — F39 Unspecified mood [affective] disorder: Secondary | ICD-10-CM

## 2023-07-22 DIAGNOSIS — I10 Essential (primary) hypertension: Secondary | ICD-10-CM | POA: Diagnosis not present

## 2023-07-22 LAB — COMPREHENSIVE METABOLIC PANEL
ALT: 9 U/L (ref 0–35)
AST: 11 U/L (ref 0–37)
Albumin: 4.1 g/dL (ref 3.5–5.2)
Alkaline Phosphatase: 58 U/L (ref 39–117)
BUN: 13 mg/dL (ref 6–23)
CO2: 27 meq/L (ref 19–32)
Calcium: 9.1 mg/dL (ref 8.4–10.5)
Chloride: 102 meq/L (ref 96–112)
Creatinine, Ser: 0.93 mg/dL (ref 0.40–1.20)
GFR: 71.91 mL/min (ref >60.00)
Glucose, Bld: 110 mg/dL — ABNORMAL HIGH (ref 70–99)
Potassium: 3.4 meq/L — ABNORMAL LOW (ref 3.5–5.1)
Sodium: 136 meq/L (ref 135–145)
Total Bilirubin: 0.6 mg/dL (ref 0.2–1.2)
Total Protein: 7.4 g/dL (ref 6.0–8.3)

## 2023-07-22 LAB — CBC WITH DIFFERENTIAL/PLATELET
Basophils Absolute: 0 10*3/uL (ref 0.0–0.1)
Basophils Relative: 0.4 % (ref 0.0–3.0)
Eosinophils Absolute: 0.1 10*3/uL (ref 0.0–0.7)
Eosinophils Relative: 1 % (ref 0.0–5.0)
HCT: 37.2 % (ref 36.0–46.0)
Hemoglobin: 11.9 g/dL — ABNORMAL LOW (ref 12.0–15.0)
Lymphocytes Relative: 36.3 % (ref 12.0–46.0)
Lymphs Abs: 2.3 10*3/uL (ref 0.7–4.0)
MCHC: 32.1 g/dL (ref 30.0–36.0)
MCV: 99.3 fL (ref 78.0–100.0)
Monocytes Absolute: 0.5 10*3/uL (ref 0.1–1.0)
Monocytes Relative: 8 % (ref 3.0–12.0)
Neutro Abs: 3.4 10*3/uL (ref 1.4–7.7)
Neutrophils Relative %: 54.3 % (ref 43.0–77.0)
Platelets: 268 10*3/uL (ref 150.0–400.0)
RBC: 3.75 Mil/uL — ABNORMAL LOW (ref 3.87–5.11)
RDW: 13.7 % (ref 11.5–15.5)
WBC: 6.3 10*3/uL (ref 4.0–10.5)

## 2023-07-22 LAB — SEDIMENTATION RATE: Sed Rate: 57 mm/h — ABNORMAL HIGH (ref 0–30)

## 2023-07-22 LAB — C-REACTIVE PROTEIN: CRP: <1 mg/dL (ref 0.5–20.0)

## 2023-07-22 MED ORDER — DICYCLOMINE HCL 20 MG PO TABS: 20.0000 mg | ORAL_TABLET | Freq: Three times a day (TID) | ORAL | 1 refills | Status: DC

## 2023-07-22 MED ORDER — DULOXETINE HCL 30 MG PO CPEP
30.0000 mg | ORAL_CAPSULE | Freq: Two times a day (BID) | ORAL | 3 refills | Status: DC
Start: 2023-07-22 — End: 2023-10-23

## 2023-07-22 NOTE — Assessment & Plan Note (Signed)
Her chronic low back pain, managed by pain management specialist Dr. Cherylann Ratel, radiates down bilateral legs and shows no improvement. She has chronic pain occurring in multiple joints. She is questioning an autoimmune condition or fibromyalgia as the cause and would like additional work up. We will check lab work as outlined and increase Cymbalta to 30 mg BID. Discussed referral to Rheumatology pending results.

## 2023-07-22 NOTE — Assessment & Plan Note (Signed)
Worsening mood due to increased pain. PHQ- 11 and GAD- 15 today. We will increase Cymbalta to 30 mg BID. Encouraged to contact if worsening symptoms, unusual behavior changes or suicidal thoughts occur.

## 2023-07-22 NOTE — Assessment & Plan Note (Signed)
Her hyperlipidemia is managed with atorvastatin (Lipitor) on Monday, Wednesday, and Friday. We will continue the current management plan. The 10-year ASCVD risk score (Arnett DK, et al., 2019) is: 3.8%. Encouraged healthy diet.

## 2023-07-22 NOTE — Assessment & Plan Note (Signed)
She reports crampy, generalized lower abdominal pain for the past month, which has been worsening in frequency and severity, especially at night. There are no associated symptoms such as nausea, vomiting, changes in bowel habits, or blood in stool, but there is tenderness on palpation. She has been taking dicyclomine irregularly. We will order a CT of the abdomen and pelvis and advise her to resume regular use of dicyclomine (Bentyl). Encouraged bland diet. Return precautions given to patient.

## 2023-07-22 NOTE — Assessment & Plan Note (Signed)
Her hypertension is managed with losartan 100mg  and hydrochlorothiazide as needed for foot swelling. BP stable today. We will continue the current management plan. Encouraged to monitor blood pressure at home.

## 2023-07-22 NOTE — Progress Notes (Signed)
Anita Dicker, NP-C Phone: [phone]  Anita Gibbs is a 52 y.o. female who presents today for transfer of care.   Discussed the use of AI scribe software for clinical note transcription with the patient, who gave verbal consent to proceed.  History of Present Illness   The patient, with a history of chronic lower back pain and degenerative disc disease, presents with a chief complaint of worsening abdominal pain and bloating for the past month. The pain is generalized, affecting the entire lower abdomen on both sides, and is described as crampy, similar to menstrual cramps. The patient has a history of hysterectomy, but the ovaries were not removed. The pain is intermittent, with no identifiable triggers or relieving factors, and does not appear to be related to meals. The patient has been using a heating pad for relief.  The patient also reports chronic lower back pain and chronic pain of multiple joints, which has not improved despite pain management. The pain causes a constant state of discomfort. She is concerned she may have fibromyalgia which is causing all of her symptoms. She is interested in a referral to Rheumatology.  The patient has been taking dicyclomine irregularly, initially believing it was for anxiety. The patient also takes pantoprazole daily for acid reflux and uses Miralax every other day. The patient has been having regular bowel movements, going twice a day, and denies any constipation or diarrhea. However, a few weeks ago, the patient experienced episodes of fecal incontinence with soft stool, which has since resolved.  The patient's mood has been affected, with PHQ- 11 and GAD- 15 today. The patient is currently on Cymbalta (duloxetine) 20 mg BID for these symptoms. The patient also takes losartan for blood pressure control, but does not regularly monitor blood pressure at home. The patient takes hydrochlorothiazide only when experiencing foot swelling, and atorvastatin  three times a week for cholesterol management. She denies chest pain, shortness of breath, lightheadedness and edema.      Social History   Tobacco Use  Smoking Status Never  Smokeless Tobacco Never    Current Outpatient Medications on File Prior to Visit  Medication Sig Dispense Refill   albuterol (VENTOLIN HFA) 108 (90 Base) MCG/ACT inhaler Inhale 2 puffs into the lungs every 6 (six) hours as needed for wheezing or shortness of breath. 1 each 11   atorvastatin (LIPITOR) 10 MG tablet MWF at night 90 tablet 3   buprenorphine (BUTRANS) 15 MCG/HR Place 1 patch onto the skin once a week. 4 patch 3   cetirizine (ZYRTEC ALLERGY) 10 MG tablet Take 1 tablet (10 mg total) by mouth at bedtime as needed. 90 tablet 3   Cholecalciferol (VITAMIN D-3) 125 MCG (5000 UT) TABS Take by mouth daily.     Cyanocobalamin (B-12 PO) Take by mouth.     diphenhydrAMINE (BENADRYL) 25 mg capsule Take 25 mg by mouth every 6 (six) hours as needed (for allergies.).     EPINEPHrine (AUVI-Q) 0.3 mg/0.3 mL IJ SOAJ injection Use as directed for severe allergic reaction 2 Device 2   fluticasone (FLONASE) 50 MCG/ACT nasal spray Place 2 sprays into both nostrils daily. 16 g 6   hydrochlorothiazide (HYDRODIURIL) 25 MG tablet Take 1 tablet (25 mg total) by mouth daily. 30 tablet 0   hydrocortisone 2.5 % cream Apply topically 2 (two) times daily. Prn to eyelids 30 g 0   losartan (COZAAR) 100 MG tablet Take 1 tablet (100 mg total) by mouth daily. 90 tablet 3   meloxicam (  MOBIC) 15 MG tablet Take 1 tablet (15 mg total) by mouth every other day as needed. 45 tablet 11   methocarbamol (ROBAXIN) 750 MG tablet Take 1 tablet (750 mg total) by mouth every 8 (eight) hours as needed for muscle spasms. 90 tablet 11   montelukast (SINGULAIR) 10 MG tablet Take 1 tablet (10 mg total) by mouth at bedtime. 90 tablet 3   pantoprazole (PROTONIX) 40 MG tablet TAKE ONE TABLET EVERY DAY 30 MIN BEFORE DINNER 90 tablet 3   polyethylene glycol powder  (GLYCOLAX/MIRALAX) 17 GM/SCOOP powder Take 17 g by mouth 2 (two) times daily as needed. 3350 g 1   potassium chloride SA (KLOR-CON M) 20 MEQ tablet Take 1 tablet (20 mEq total) by mouth daily as needed. With lasix 30 tablet 11   pregabalin (LYRICA) 75 MG capsule Take 1 capsule (75 mg total) by mouth 3 (three) times daily. 90 capsule 11   saccharomyces boulardii (FLORASTOR) 250 MG capsule Take 1 capsule (250 mg total) by mouth daily. 90 capsule 0   No current facility-administered medications on file prior to visit.    ROS see history of present illness  Objective  Physical Exam Vitals:   07/22/23 1010  BP: 132/86  Pulse: 61  Temp: 97.6 F (36.4 C)  SpO2: 96%    BP Readings from Last 3 Encounters:  07/22/23 132/86  06/11/23 (!) 142/75  05/18/23 136/74   Wt Readings from Last 3 Encounters:  07/22/23 250 lb 6.4 oz (113.6 kg)  06/11/23 232 lb (105.2 kg)  05/18/23 232 lb (105.2 kg)    Physical Exam Constitutional:      General: She is not in acute distress.    Appearance: Normal appearance.  HENT:     Head: Normocephalic.  Cardiovascular:     Rate and Rhythm: Normal rate and regular rhythm.     Heart sounds: Normal heart sounds.  Pulmonary:     Effort: Pulmonary effort is normal.     Breath sounds: Normal breath sounds.  Abdominal:     General: Abdomen is flat. Bowel sounds are normal. There is no distension.     Palpations: Abdomen is soft.     Tenderness: There is generalized abdominal tenderness (lower abd).  Skin:    General: Skin is warm and dry.  Neurological:     General: No focal deficit present.     Mental Status: She is alert.  Psychiatric:        Mood and Affect: Mood normal.        Behavior: Behavior normal.    Assessment/Plan: Please see individual problem list.  Lower abdominal pain Assessment & Plan: She reports crampy, generalized lower abdominal pain for the past month, which has been worsening in frequency and severity, especially at night.  There are no associated symptoms such as nausea, vomiting, changes in bowel habits, or blood in stool, but there is tenderness on palpation. She has been taking dicyclomine irregularly. We will order a CT of the abdomen and pelvis and advise her to resume regular use of dicyclomine (Bentyl). Encouraged bland diet. Return precautions given to patient.   Orders: -     Dicyclomine HCl; Take 1 tablet (20 mg total) by mouth 4 (four) times daily -  before meals and at bedtime.  Dispense: 120 tablet; Refill: 1 -     Comprehensive metabolic panel -     CT ABDOMEN PELVIS W CONTRAST; Future  Chronic pain syndrome Assessment & Plan: Her chronic low back pain,  managed by pain management specialist Dr. Cherylann Ratel, radiates down bilateral legs and shows no improvement. She has chronic pain occurring in multiple joints. She is questioning an autoimmune condition or fibromyalgia as the cause and would like additional work up. We will check lab work as outlined and increase Cymbalta to 30 mg BID. Discussed referral to Rheumatology pending results.   Orders: -     DULoxetine HCl; Take 1 capsule (30 mg total) by mouth 2 (two) times daily.  Dispense: 180 capsule; Refill: 3 -     Sedimentation rate -     ANA,IFA RA Diag Pnl w/rflx Tit/Patn -     C-reactive protein  Mood disorder (HCC) Assessment & Plan: Worsening mood due to increased pain. PHQ- 11 and GAD- 15 today. We will increase Cymbalta to 30 mg BID. Encouraged to contact if worsening symptoms, unusual behavior changes or suicidal thoughts occur.   Orders: -     DULoxetine HCl; Take 1 capsule (30 mg total) by mouth 2 (two) times daily.  Dispense: 180 capsule; Refill: 3  Essential hypertension Assessment & Plan: Her hypertension is managed with losartan 100mg  and hydrochlorothiazide as needed for foot swelling. BP stable today. We will continue the current management plan. Encouraged to monitor blood pressure at home.   Orders: -     CBC with  Differential/Platelet  Hyperlipidemia, unspecified hyperlipidemia type Assessment & Plan: Her hyperlipidemia is managed with atorvastatin (Lipitor) on Monday, Wednesday, and Friday. We will continue the current management plan. The 10-year ASCVD risk score (Arnett DK, et al., 2019) is: 3.8%. Encouraged healthy diet.    Need for influenza vaccination -     Flu vaccine trivalent PF, 6mos and older(Flulaval,Afluria,Fluarix,Fluzone)   Return in about 3 months (around 10/22/2023) for Follow up.   Anita Dicker, NP-C [facility] - [employer]

## 2023-07-24 ENCOUNTER — Other Ambulatory Visit: Payer: Self-pay | Admitting: Nurse Practitioner

## 2023-07-24 DIAGNOSIS — R768 Other specified abnormal immunological findings in serum: Secondary | ICD-10-CM

## 2023-07-24 LAB — ANA,IFA RA DIAG PNL W/RFLX TIT/PATN
Anti Nuclear Antibody (ANA): POSITIVE — AB
Cyclic Citrullin Peptide Ab: <16 U
Rheumatoid fact SerPl-aCnc: <10 [IU]/mL (ref <14)

## 2023-07-24 LAB — ANTI-NUCLEAR AB-TITER (ANA TITER): ANA Titer 1: 1:160 {titer} — ABNORMAL HIGH

## 2023-07-27 ENCOUNTER — Ambulatory Visit
Admission: RE | Admit: 2023-07-27 | Discharge: 2023-07-27 | Disposition: A | Discharge status: Home / Self Care | Payer: 59 | Source: Ambulatory Visit | Attending: Nurse Practitioner | Admitting: Nurse Practitioner

## 2023-07-27 ENCOUNTER — Ambulatory Visit
Payer: 59 | Attending: Student in an Organized Health Care Education/Training Program | Admitting: Student in an Organized Health Care Education/Training Program

## 2023-07-27 DIAGNOSIS — M5442 Lumbago with sciatica, left side: Secondary | ICD-10-CM | POA: Diagnosis not present

## 2023-07-27 DIAGNOSIS — M47816 Spondylosis without myelopathy or radiculopathy, lumbar region: Secondary | ICD-10-CM | POA: Diagnosis not present

## 2023-07-27 DIAGNOSIS — N83291 Other ovarian cyst, right side: Secondary | ICD-10-CM | POA: Diagnosis not present

## 2023-07-27 DIAGNOSIS — R109 Unspecified abdominal pain: Secondary | ICD-10-CM | POA: Diagnosis not present

## 2023-07-27 DIAGNOSIS — G8929 Other chronic pain: Secondary | ICD-10-CM

## 2023-07-27 DIAGNOSIS — G894 Chronic pain syndrome: Secondary | ICD-10-CM

## 2023-07-27 DIAGNOSIS — M5441 Lumbago with sciatica, right side: Secondary | ICD-10-CM

## 2023-07-27 DIAGNOSIS — R1030 Lower abdominal pain, unspecified: Secondary | ICD-10-CM

## 2023-07-27 DIAGNOSIS — R140 Abdominal distension (gaseous): Secondary | ICD-10-CM | POA: Diagnosis not present

## 2023-07-27 MED ORDER — IOPAMIDOL (ISOVUE-300) INJECTION 61%
100.0000 mL | Freq: Once | INTRAVENOUS | Status: AC | PRN
  Administered 2023-07-27: 100 mL via INTRAVENOUS

## 2023-07-27 NOTE — Progress Notes (Signed)
Patient: Anita Gibbs  Service Category: E/M  Provider: Edward Jolly, MD  DOB: 13-Dec-1970  DOS: 07/27/2023  Location: Office  MRN: [id number]  Setting: Ambulatory outpatient  Referring Provider: [name], NP  Type: Established Patient  Specialty: Interventional Pain Management  PCP: [name], NP  Location: Remote location  Delivery: TeleHealth     Virtual Encounter - Pain Management PROVIDER NOTE: Information contained herein reflects review and annotations entered in association with encounter. Interpretation of such information and data should be left to medically-trained personnel. Information provided to patient can be located elsewhere in the medical record under "Patient Instructions". Document created using STT-dictation technology, any transcriptional errors that may result from process are unintentional.    Contact & Pharmacy Preferred: [phone] Home: [phone] (home) Mobile: [phone] (mobile) E-mail: [email or web address]  [facility] - [place], [place] - [address] [place] [address] Phone: [phone] Fax: [phone]   Pre-screening  Anita Gibbs offered "in-person" vs "virtual" encounter. She indicated preferring virtual for this encounter.   Reason COVID-19*  Social distancing based on CDC and AMA recommendations.   I contacted Anita Gibbs on 07/27/2023 via telephone.      I clearly identified myself as Edward Jolly, MD. I verified that I was speaking with the correct person using two identifiers (Name: Anita Gibbs, and date of birth: 1971/02/06).  Consent I sought verbal advanced consent from Anita Gibbs for virtual visit interactions. I informed Anita Gibbs of possible security and privacy concerns, risks, and limitations associated with providing "not-in-person" medical evaluation and management services. I also informed Anita Gibbs of the availability of "in-person" appointments. Finally, I informed her  that there would be a charge for the virtual visit and that she could be  personally, fully or partially, financially responsible for it. Ms. Bach expressed understanding and agreed to proceed.   Historic Elements   Anita Gibbs is a 52 y.o. year old, female patient evaluated today after our last contact on 07/21/2023. 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), Adrenal nodule (HCC) (06/20/2020), Allergy, ANA positive (02/01/2021), Anxiety and depression (01/29/2021), Arthritis, Back pain with history of spinal surgery (01/29/2021), Bilateral carpal tunnel syndrome (01/29/2021), Bilateral leg edema (01/29/2021), BMI 40.0-44.9, adult (HCC) (04/30/2022), Carpal boss, right, Carpal tunnel syndrome, bilateral, 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) (09/27/2018), Cervical facet hypertrophy (C3-T1) (09/27/2018), Cervical facet joint syndrome (Bilateral) (L>R) (09/27/2018), 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-5, C5-6, and C6-7) (09/27/2018), Cervical myelopathy (HCC) (07/01/2017), Cervicalgia (Primary Area of Pain) (Bilateral) (L>R) (09/27/2018), Chronic gout of foot (Left) (05/31/2018), Chronic low back pain (Tertiary Area of Pain) (Bilateral) (R>L) w/ sciatica (Bilateral) (09/07/2018), Chronic low back pain (Tertiary Area of Pain) (Bilateral) (R>L) w/o sciatica (07/26/2018), Chronic lower extremity pain (Fourth Area of Pain) (Bilateral) (R>L) (09/07/2018), Chronic musculoskeletal pain (10/27/2018), Chronic neck pain (Bilateral) w/ history of cervical spinal surgery (09/27/2018), Chronic neck pain (Primary Area of Pain) (Bilateral) (L>R) (09/07/2018), Chronic sacroiliac joint dysfunction (Bilateral) (09/27/2018), Chronic sacroiliac joint pain (Right)  (09/07/2018), Chronic upper extremity pain (Secondary Area of Pain) (Bilateral) (R>L) (09/27/2018), Disorder of skeletal system (09/07/2018), E. coli UTI (urinary tract infection) (06/20/2020), Elevated sed rate (09/08/2018), Elevated serum creatinine (06/20/2020), Excessive daytime sleepiness (03/06/2020), Female pelvic pain (09/05/2020), GERD (gastroesophageal reflux disease), Headache, History of allergy to shellfish (09/28/2018), History  of fusion of cervical spine (ACDF C4-C7) (09/27/2018), History of illicit drug use (03/07/2020), Hives (09/05/2020), Hives (09/05/2020), Hyperkalemia (05/31/2018), Hyperlipidemia (04/30/2022), Hypertension, Large breasts (05/30/2020), Left hip pain (07/21/2022), Lumbar facet arthropathy (04/20/2019), Lumbar radiculitis (L5 dermatome) (Right) (04/17/2020), Multiple environmental allergies (01/17/2020), Multiple food allergies (03/07/2020), Neurogenic pain (10/27/2018), Numbness and tingling of both feet (05/31/2018), 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), Other intervertebral disc degeneration, 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), Pharmacologic therapy (09/07/2018), Plantar fasciitis, bilateral (03/04/2021), Polyneuropathy (01/29/2021), Prediabetes (10/10/2019), Problems influencing health status (09/07/2018), Rash (08/08/2019), Seasonal allergic rhinitis due to pollen (11/29/2018), Seasonal allergies (03/06/2020), Seasonal asthma (11/29/2018), Sleep apnea, Somatic dysfunction of sacroiliac joint (Bilateral) (09/27/2018), Spondylosis without myelopathy or radiculopathy, cervical region (03/22/2019), Spondylosis without myelopathy or radiculopathy, lumbosacral region (05/19/2019), Spondylosis, cervical, 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 She also  has a past surgical history that includes Knee arthroscopy (Right); Anterior cervical decomp/discectomy fusion (N/A, 07/01/2017); Abdominal hysterectomy (10/17/2020); and Carpal tunnel release  (Bilateral). Anita Gibbs has a current medication list which includes the following prescription(s): albuterol, atorvastatin, buprenorphine, cetirizine, vitamin d-3, cyanocobalamin, dicyclomine, diphenhydramine, duloxetine, epinephrine, fluticasone, hydrochlorothiazide, hydrocortisone, losartan, meloxicam, methocarbamol, montelukast, pantoprazole, polyethylene glycol powder, potassium chloride sa, pregabalin, and saccharomyces boulardii. She  reports that she has never smoked. She has never used smokeless tobacco. She reports current alcohol use of about 2.0 standard drinks of alcohol per week. She reports that she does not use drugs. Anita Gibbs is allergic to other, pineapple, shellfish allergy, strawberry (diagnostic), and tomato.  BMI: Estimated body mass index is 44.36 kg/m as calculated from the following:   Height as of 07/22/23: 5\' 3"  (1.6 m).   Weight as of 07/22/23: 250 lb 6.4 oz (113.6 kg). Last encounter: 06/11/2023. Last procedure: 05/18/2023.  HPI  Today, she is being contacted for  review L-MRI   Laboratory Chemistry Profile   Renal Lab Results  Component Value Date   BUN 13 07/22/2023   CREATININE 0.93 07/22/2023   BCR NOT APPLICABLE 02/13/2021   GFR 71.91 07/22/2023   GFRAA 76 09/07/2018   GFRNONAA 66 09/07/2018    Hepatic Lab Results  Component Value Date   AST 11 07/22/2023   ALT 9 07/22/2023   ALBUMIN 4.1 07/22/2023   ALKPHOS 58 07/22/2023   LIPASE 32.0 06/30/2022    Electrolytes Lab Results  Component Value Date   NA 136 07/22/2023   K 3.4 (L) 07/22/2023   CL 102 07/22/2023   CALCIUM 9.1 07/22/2023   MG 2.2 09/07/2018    Bone Lab Results  Component Value Date   VD25OH 29.35 (L) 04/08/2023   25OHVITD1 8.1 (L) 09/07/2018   25OHVITD2 <1.0 09/07/2018   25OHVITD3 8.1 09/07/2018    Inflammation (CRP: Acute Phase) (ESR: Chronic Phase) Lab Results  Component Value Date   CRP <1.0 07/22/2023   ESRSEDRATE 57 (H) 07/22/2023         Note: Above Lab  results reviewed.  Imaging  MR LUMBAR SPINE WO CONTRAST CLINICAL DATA:  Low back pain. Lumbar facet arthropathy. Neurogenic pain. Right lumbar radiculitis. Chronic pain syndrome.  EXAM: MRI LUMBAR SPINE WITHOUT CONTRAST  TECHNIQUE: Multiplanar, multisequence MR imaging of the lumbar spine was performed. No intravenous contrast was administered.  COMPARISON:  MRI of the lumbar spine February 12, 2021.  FINDINGS: Segmentation:  Standard.  Alignment:  Physiologic.  Vertebrae: No fracture, evidence of discitis, or aggressive bone lesion. Hemangiomas are seen at T11, L1 and L2.  Conus medullaris and cauda equina: Conus extends to the L1 level. Conus and cauda  equina appear normal.  Paraspinal and other soft tissues: Left adnexal simple-appearing cyst measuring 2.7 cm. No follow-up imaging is recommended.  Disc levels:  T12-L1:No spinal canal or neural foraminal stenosis.  L1-2:No spinal canal or neural foraminal stenosis.  L2-3:No spinal canal or neural foraminal stenosis.  L3-4:Disc desiccation, minimal disc bulge and mild facet degenerative changes without significant spinal canal or neural foraminal stenosis. No significant change compared to MRI.  L4-5:Moderate facet degenerative changes with mild bilateral joint effusion, mildly progressed compared to prior MRI.No significant spinal canal or neural foraminal stenosis.  L5-S1:Moderate facet degenerative changes, right greater than left, mildly progressed compared to prior MRI.No significant spinal canal or neural foraminal stenosis.  IMPRESSION: 1. Moderate facet arthropathy at L4-5 and L5-S1, mildly progressed compared to prior MRI. 2. No significant spinal canal or neural foraminal stenosis at any level.  Electronically Signed   By: Baldemar Lenis M.D.   On: 07/08/2023 11:10  Assessment  The primary encounter diagnosis was Lumbar facet arthropathy. Diagnoses of Lumbar facet syndrome (Bilateral)  (R>L), Chronic low back pain (Tertiary Area of Pain) (Bilateral) (R>L) w/ sciatica (Bilateral), and Chronic pain syndrome were also pertinent to this visit.  Plan of Care  Reviewed lumbar MRI with patient.  She has lower lumbar spondylosis.  She is status post left lumbar radiofrequency ablation in September.  We can consider repeating after the new year.  I encouraged her to perform stretching exercises at home for her low back pain.  Also encouraged her to apply a TENS unit to her lower back.  She states that she would do that.  She has a medication management visit with me scheduled at the end of January which I instructed her to keep.   Follow-up plan:   Return for Keep sch. appt.      Recent Visits Date Type Provider Dept  06/11/23 Office Visit Edward Jolly, MD [facility]  05/18/23 Procedure visit Edward Jolly, MD [facility]  Showing recent visits within past 90 days and meeting all other requirements Today's Visits Date Type Provider Dept  07/27/23 Office Visit Edward Jolly, MD [facility]  Showing today's visits and meeting all other requirements Future Appointments Date Type Provider Dept  09/22/23 Appointment Edward Jolly, MD [facility]  Showing future appointments within next 90 days and meeting all other requirements  I discussed the assessment and treatment plan with the patient. The patient was provided an opportunity to ask questions and all were answered. The patient agreed with the plan and demonstrated an understanding of the instructions.  Patient advised to call back or seek an in-person evaluation if the symptoms or condition worsens.  Duration of encounter: .  Note by: Edward Jolly, MD Date: 07/27/2023; Time: 3:29 PM

## 2023-07-29 ENCOUNTER — Ambulatory Visit: Payer: 59

## 2023-08-02 DIAGNOSIS — Z419 Encounter for procedure for purposes other than remedying health state, unspecified: Secondary | ICD-10-CM | POA: Diagnosis not present

## 2023-08-04 ENCOUNTER — Telehealth: Payer: Self-pay | Admitting: Student in an Organized Health Care Education/Training Program

## 2023-08-04 ENCOUNTER — Other Ambulatory Visit: Payer: Self-pay | Admitting: *Deleted

## 2023-08-04 MED ORDER — PREGABALIN 75 MG PO CAPS: 75.0000 mg | ORAL_CAPSULE | Freq: Three times a day (TID) | ORAL | 11 refills | Status: DC

## 2023-08-04 NOTE — Telephone Encounter (Signed)
Rx request sent to Dr. Lateef 

## 2023-08-04 NOTE — Telephone Encounter (Signed)
PT called stated that when she was last her for mm ,appt that [facility] were going to send in order for pregabalin. PT stated that the pharmacy states that a new order needs to be send over.

## 2023-08-04 NOTE — Telephone Encounter (Signed)
Patient informed. 

## 2023-08-12 DIAGNOSIS — M654 Radial styloid tenosynovitis [de Quervain]: Secondary | ICD-10-CM | POA: Diagnosis not present

## 2023-08-31 ENCOUNTER — Ambulatory Visit (INDEPENDENT_AMBULATORY_CARE_PROVIDER_SITE_OTHER): Payer: 59 | Admitting: Nurse Practitioner

## 2023-08-31 ENCOUNTER — Other Ambulatory Visit (HOSPITAL_COMMUNITY)
Admission: RE | Admit: 2023-08-31 | Discharge: 2023-08-31 | Disposition: A | Discharge status: Home / Self Care | Payer: 59 | Source: Ambulatory Visit | Attending: Nurse Practitioner | Admitting: Nurse Practitioner

## 2023-08-31 ENCOUNTER — Telehealth: Payer: Self-pay

## 2023-08-31 ENCOUNTER — Encounter: Payer: Self-pay | Admitting: Nurse Practitioner

## 2023-08-31 VITALS — BP 144/86 | HR 67 | Temp 97.7°F | Ht 63.0 in | Wt 245.5 lb

## 2023-08-31 DIAGNOSIS — M79642 Pain in left hand: Secondary | ICD-10-CM | POA: Diagnosis not present

## 2023-08-31 DIAGNOSIS — N898 Other specified noninflammatory disorders of vagina: Secondary | ICD-10-CM

## 2023-08-31 DIAGNOSIS — I10 Essential (primary) hypertension: Secondary | ICD-10-CM

## 2023-08-31 DIAGNOSIS — F39 Unspecified mood [affective] disorder: Secondary | ICD-10-CM | POA: Diagnosis not present

## 2023-08-31 LAB — CERVICOVAGINAL ANCILLARY ONLY
Bacterial Vaginitis (gardnerella): POSITIVE — AB
Candida Glabrata: NEGATIVE
Candida Vaginitis: POSITIVE — AB
Chlamydia: NEGATIVE
Comment: NEGATIVE
Comment: NEGATIVE
Comment: NEGATIVE
Comment: NEGATIVE
Comment: NEGATIVE
Comment: NORMAL
Neisseria Gonorrhea: NEGATIVE
Trichomonas: NEGATIVE

## 2023-08-31 MED ORDER — FLUCONAZOLE 150 MG PO TABS: ORAL_TABLET | ORAL | 0 refills | Status: DC

## 2023-08-31 NOTE — Assessment & Plan Note (Addendum)
Her blood pressure was elevated at today's visit due to not taking Losartan and Hydrochlorothiazide this morning. We will recheck her blood pressure today and ensure she continues Losartan 100mg  daily and Hydrochlorothiazide 25mg  daily as prescribed, emphasizing the importance of taking her blood pressure medication daily. We will continue to monitor and add additional antihypertensive medication if remaining consistently elevated.

## 2023-08-31 NOTE — Assessment & Plan Note (Signed)
She reports a low mood and lack of energy following the recent bereavement of her father in May, while currently on Cymbalta. We increased her Cymbalta to 30mg  BID last month. She notes mild improvement with some days being better than others. We will consider adding an additional antidepressant if symptoms persist and suggest a referral for therapy or counseling, leaving her to think about this option. PHQ- 13 and GAD- 9 today. Encouraged to contact if worsening symptoms, unusual behavior changes or suicidal thoughts occur.

## 2023-08-31 NOTE — Telephone Encounter (Signed)
Called pt to see if she would like to come earlier as we seen out 10 am pt early and pt stated she would be here in about 10-15 minutes

## 2023-08-31 NOTE — Assessment & Plan Note (Signed)
She presents with a thick white discharge and itching for 4 days, without dysuria, abnormal bleeding, lesions, or abnormal odor, and has a history of recurrent yeast infections. We will administer Diflucan, two tablets, instructing her to take one tablet now and repeat in 72 hours. A self-swab has been sent for testing, and we will call with results.

## 2023-08-31 NOTE — Progress Notes (Signed)
[name], NP-C Phone: [phone]  Anita Gibbs is a 52 y.o. female who presents today for vaginal discharge.   Discussed the use of AI scribe software for clinical note transcription with the patient, who gave verbal consent to proceed.  History of Present Illness   The patient, with a history of hypertension and hysterectomy, presents with a four-day history of thick, white vaginal discharge associated with itching. She reports no abnormal bleeding, blisters, bumps, or abnormal odor. The patient has a history of similar presentations in the past, diagnosed as yeast infections. She denies any recent sexual exposure that could suggest an STD.  The patient also reports recent surgery on her hand and a course of antibiotics, which she believes may have contributed to her symptoms.   In addition to these physical symptoms, the patient has been experiencing low mood and lethargy, spending most of her time in bed. She has been taking Cymbalta for this, but reports that her mood has been particularly low following the passing of her father in May. She is considering the possibility of seeking therapy or counseling.  The patient has been adhering to her prescribed medication regimen, which includes losartan, hydrochlorothiazide, and Cymbalta. She reports no chest pain, shortness of breath, or dizziness.      Social History   Tobacco Use  Smoking Status Never  Smokeless Tobacco Never    Current Outpatient Medications on File Prior to Visit  Medication Sig Dispense Refill   albuterol (VENTOLIN HFA) 108 (90 Base) MCG/ACT inhaler Inhale 2 puffs into the lungs every 6 (six) hours as needed for wheezing or shortness of breath. 1 each 11   atorvastatin (LIPITOR) 10 MG tablet MWF at night 90 tablet 3   buprenorphine (BUTRANS) 15 MCG/HR Place 1 patch onto the skin once a week. 4 patch 3   cetirizine (ZYRTEC ALLERGY) 10 MG tablet Take 1 tablet (10 mg total) by mouth at bedtime as needed. 90  tablet 3   Cholecalciferol (VITAMIN D-3) 125 MCG (5000 UT) TABS Take by mouth daily.     Cyanocobalamin (B-12 PO) Take by mouth.     dicyclomine (BENTYL) 20 MG tablet Take 1 tablet (20 mg total) by mouth 4 (four) times daily -  before meals and at bedtime. 120 tablet 1   diphenhydrAMINE (BENADRYL) 25 mg capsule Take 25 mg by mouth every 6 (six) hours as needed (for allergies.).     DULoxetine (CYMBALTA) 30 MG capsule Take 1 capsule (30 mg total) by mouth 2 (two) times daily. 180 capsule 3   EPINEPHrine (AUVI-Q) 0.3 mg/0.3 mL IJ SOAJ injection Use as directed for severe allergic reaction 2 Device 2   fluticasone (FLONASE) 50 MCG/ACT nasal spray Place 2 sprays into both nostrils daily. 16 g 6   hydrochlorothiazide (HYDRODIURIL) 25 MG tablet Take 1 tablet (25 mg total) by mouth daily. 30 tablet 0   hydrocortisone 2.5 % cream Apply topically 2 (two) times daily. Prn to eyelids 30 g 0   losartan (COZAAR) 100 MG tablet Take 1 tablet (100 mg total) by mouth daily. 90 tablet 3   meloxicam (MOBIC) 15 MG tablet Take 1 tablet (15 mg total) by mouth every other day as needed. 45 tablet 11   methocarbamol (ROBAXIN) 750 MG tablet Take 1 tablet (750 mg total) by mouth every 8 (eight) hours as needed for muscle spasms. 90 tablet 11   montelukast (SINGULAIR) 10 MG tablet Take 1 tablet (10 mg total) by mouth at bedtime. 90 tablet 3  pantoprazole (PROTONIX) 40 MG tablet TAKE ONE TABLET EVERY DAY 30 MIN BEFORE DINNER 90 tablet 3   polyethylene glycol powder (GLYCOLAX/MIRALAX) 17 GM/SCOOP powder Take 17 g by mouth 2 (two) times daily as needed. 3350 g 1   potassium chloride SA (KLOR-CON M) 20 MEQ tablet Take 1 tablet (20 mEq total) by mouth daily as needed. With lasix 30 tablet 11   pregabalin (LYRICA) 75 MG capsule Take 1 capsule (75 mg total) by mouth 3 (three) times daily. 90 capsule 11   saccharomyces boulardii (FLORASTOR) 250 MG capsule Take 1 capsule (250 mg total) by mouth daily. 90 capsule 0   No current  facility-administered medications on file prior to visit.    ROS see history of present illness  Objective  Physical Exam Vitals:   08/31/23 1020 08/31/23 1033  BP: (!) 150/82 (!) 144/86  Pulse: 67   Temp: 97.7 F (36.5 C)   SpO2: 99%     BP Readings from Last 3 Encounters:  08/31/23 (!) 144/86  07/22/23 132/86  06/11/23 (!) 142/75   Wt Readings from Last 3 Encounters:  08/31/23 245 lb 8 oz (111.4 kg)  07/22/23 250 lb 6.4 oz (113.6 kg)  06/11/23 232 lb (105.2 kg)    Physical Exam Constitutional:      General: She is not in acute distress.    Appearance: Normal appearance.  HENT:     Head: Normocephalic.  Cardiovascular:     Rate and Rhythm: Normal rate and regular rhythm.     Heart sounds: Normal heart sounds.  Pulmonary:     Effort: Pulmonary effort is normal.     Breath sounds: Normal breath sounds.  Skin:    General: Skin is warm and dry.  Neurological:     General: No focal deficit present.     Mental Status: She is alert.  Psychiatric:        Mood and Affect: Mood normal.        Behavior: Behavior normal.     Assessment/Plan: Please see individual problem list.  Vaginal discharge Assessment & Plan: She presents with a thick white discharge and itching for 4 days, without dysuria, abnormal bleeding, lesions, or abnormal odor, and has a history of recurrent yeast infections. We will administer Diflucan, two tablets, instructing her to take one tablet now and repeat in 72 hours. A self-swab has been sent for testing, and we will call with results.  Orders: -     Cervicovaginal ancillary only -     Fluconazole; Take 1 tablet by mouth x 1 dose. May repeat in 72 hours if needed.  Dispense: 2 tablet; Refill: 0  Essential hypertension Assessment & Plan: Her blood pressure was elevated at today's visit due to not taking Losartan and Hydrochlorothiazide this morning. We will recheck her blood pressure today and ensure she continues Losartan 100mg  daily and  Hydrochlorothiazide 25mg  daily as prescribed, emphasizing the importance of taking her blood pressure medication daily. We will continue to monitor and add additional antihypertensive medication if remaining consistently elevated.    Mood disorder [facility]) Assessment & Plan: She reports a low mood and lack of energy following the recent bereavement of her father in May, while currently on Cymbalta. We increased her Cymbalta to 30mg  BID last month. She notes mild improvement with some days being better than others. We will consider adding an additional antidepressant if symptoms persist and suggest a referral for therapy or counseling, leaving her to think about this option. PHQ- 13  and GAD- 9 today. Encouraged to contact if worsening symptoms, unusual behavior changes or suicidal thoughts occur.     Return if symptoms worsen or fail to improve.   [name], NP-C [facility]

## 2023-09-01 ENCOUNTER — Other Ambulatory Visit: Payer: Self-pay | Admitting: Nurse Practitioner

## 2023-09-01 DIAGNOSIS — B9689 Other specified bacterial agents as the cause of diseases classified elsewhere: Secondary | ICD-10-CM

## 2023-09-01 MED ORDER — METRONIDAZOLE 500 MG PO TABS: 500.0000 mg | ORAL_TABLET | Freq: Two times a day (BID) | ORAL | 0 refills | Status: AC

## 2023-09-02 DIAGNOSIS — Z419 Encounter for procedure for purposes other than remedying health state, unspecified: Secondary | ICD-10-CM | POA: Diagnosis not present

## 2023-09-14 DIAGNOSIS — M79642 Pain in left hand: Secondary | ICD-10-CM | POA: Diagnosis not present

## 2023-09-15 DIAGNOSIS — M5442 Lumbago with sciatica, left side: Secondary | ICD-10-CM | POA: Diagnosis not present

## 2023-09-15 DIAGNOSIS — M47816 Spondylosis without myelopathy or radiculopathy, lumbar region: Secondary | ICD-10-CM | POA: Diagnosis not present

## 2023-09-15 DIAGNOSIS — M5416 Radiculopathy, lumbar region: Secondary | ICD-10-CM | POA: Diagnosis not present

## 2023-09-15 DIAGNOSIS — G8929 Other chronic pain: Secondary | ICD-10-CM | POA: Diagnosis not present

## 2023-09-18 DIAGNOSIS — M791 Myalgia, unspecified site: Secondary | ICD-10-CM | POA: Diagnosis not present

## 2023-09-18 DIAGNOSIS — R5382 Chronic fatigue, unspecified: Secondary | ICD-10-CM | POA: Diagnosis not present

## 2023-09-18 DIAGNOSIS — M2550 Pain in unspecified joint: Secondary | ICD-10-CM | POA: Diagnosis not present

## 2023-09-22 ENCOUNTER — Ambulatory Visit
Payer: 59 | Attending: Student in an Organized Health Care Education/Training Program | Admitting: Student in an Organized Health Care Education/Training Program

## 2023-09-22 ENCOUNTER — Encounter: Payer: Self-pay | Admitting: Student in an Organized Health Care Education/Training Program

## 2023-09-22 VITALS — BP 143/76 | HR 66 | Temp 97.4°F | Resp 16 | Ht 63.0 in | Wt 238.0 lb

## 2023-09-22 DIAGNOSIS — G894 Chronic pain syndrome: Secondary | ICD-10-CM | POA: Insufficient documentation

## 2023-09-22 DIAGNOSIS — M5442 Lumbago with sciatica, left side: Secondary | ICD-10-CM | POA: Diagnosis not present

## 2023-09-22 DIAGNOSIS — G8929 Other chronic pain: Secondary | ICD-10-CM | POA: Insufficient documentation

## 2023-09-22 DIAGNOSIS — M5441 Lumbago with sciatica, right side: Secondary | ICD-10-CM | POA: Diagnosis not present

## 2023-09-22 DIAGNOSIS — M47816 Spondylosis without myelopathy or radiculopathy, lumbar region: Secondary | ICD-10-CM | POA: Diagnosis not present

## 2023-09-22 NOTE — Progress Notes (Signed)
Nursing Pain Medication Assessment:  Safety precautions to be maintained throughout the outpatient stay will include: orient to surroundings, keep bed in low position, maintain call bell within reach at all times, provide assistance with transfer out of bed and ambulation.  Medication Inspection Compliance: Anita Gibbs did not comply with our request to bring her pills to be counted. She was reminded that bringing the medication bottles, even when empty, is a requirement.  Medication: None brought in. Pill/Patch Count: None available to be counted. Bottle Appearance: No container available. Did not bring bottle(s) to appointment. Filled Date: N/A Last Medication intake:  Yesterday

## 2023-09-22 NOTE — Progress Notes (Signed)
PROVIDER NOTE: Information contained herein reflects review and annotations entered in association with encounter. Interpretation of such information and data should be left to medically-trained personnel. Information provided to patient can be located elsewhere in the medical record under "Patient Instructions". Document created using STT-dictation technology, any transcriptional errors that may result from process are unintentional.    Patient: Anita Gibbs  Service Category: E/M  Provider: Edward Jolly, MD  DOB: 06-Nov-1970  DOS: 09/22/2023  Referring Provider: Dana Allan, MD  MRN: [id number]  Specialty: Interventional Pain Management  PCP: [name], NP  Type: Established Patient  Setting: Ambulatory outpatient    Location: Office  Delivery: Face-to-face     HPI  [name], a 53 y.o. year old female, is here today because of her Lumbar facet arthropathy [M47.816]. Anita Gibbs primary complain today is Back Pain  Pertinent problems: [name] has Morbid obesity with BMI of 45.0-49.9, adult [facility]) on their pertinent problem list. Pain Assessment: Severity of Chronic pain is reported as a 7 /10. Location: Back Lower/down left leg to toes. Onset: More than a month ago. Quality: Aching, Burning, Shooting, Sharp. Timing: Constant. Modifying factor(s): rest, heat, ice. Vitals:  height is 5\' 3"  (1.6 m) and weight is 238 lb (108 kg). Her temperature is 97.4 F (36.3 C) (abnormal). Her blood pressure is 143/76 (abnormal) and her pulse is 66. Her respiration is 16 and oxygen saturation is 99%.  BMI: Estimated body mass index is 42.16 kg/m as calculated from the following:   Height as of this encounter: 5\' 3"  (1.6 m).   Weight as of this encounter: 238 lb (108 kg). Last encounter: 07/27/2023. Last procedure: 05/18/2023.  Reason for encounter:   History of Present Illness   The patient, diagnosed with fibromyalgia and an unspecified syndrome, reports being under the care of a pain  management clinic. She is currently on Cymbalta (30mg  twice daily) and Lyrica for fibromyalgia management. She also mentions a recent diagnosis of inflammatory arthritis  The patient has been diagnosed with Sjogren's syndrome, an autoimmune condition, which presents with dryness in the eyes and mouth, swelling, and joint pains. S  She is also on a Butrans patch, which was increased to 15mg . The patch has been beneficial, but she reports drowsiness and a burning sensation at the application site.  The patient also mentions receiving a message about her medication nearing expiration, but it is unclear which medication this refers to. She has multiple refills left on her Lyrica prescription.       Pharmacotherapy Assessment  Analgesic: Butrans patch 15 mcg/hr  Monitoring: [place] PMP: PDMP reviewed during this encounter.       Pharmacotherapy: No side-effects or adverse reactions reported. Compliance: No problems identified. Effectiveness: Clinically acceptable.  Anita Mattes, RN  09/22/2023 10:42 AM  Sign when Signing Visit Nursing Pain Medication Assessment:  Safety precautions to be maintained throughout the outpatient stay will include: orient to surroundings, keep bed in low position, maintain call bell within reach at all times, provide assistance with transfer out of bed and ambulation.  Medication Inspection Compliance: Anita Gibbs did not comply with our request to bring her pills to be counted. She was reminded that bringing the medication bottles, even when empty, is a requirement.  Medication: None brought in. Pill/Patch Count: None available to be counted. Bottle Appearance: No container available. Did not bring bottle(s) to appointment. Filled Date: N/A Last Medication intake:  Yesterday    No results found for: "CBDTHCR" No results  found for: "D8THCCBX" No results found for: "D9THCCBX"  UDS:  Summary  Date Value Ref Range Status  02/01/2020 Note  Final    Comment:     ==================================================================== [employer] Select 13 (MW) ==================================================================== Test                             Result       Flag       Units Drug Present not Declared for Prescription Verification   Carboxy-THC                    20           UNEXPECTED ng/mg creat    Carboxy-THC is a metabolite of tetrahydrocannabinol (THC). Source of    THC is most commonly herbal marijuana or marijuana-based products,    but THC is also present in a scheduled prescription medication.    Trace amounts of THC can be present in hemp and cannabidiol (CBD)    products. This test is not intended to distinguish between delta-9-    tetrahydrocannabinol, the predominant form of THC in most herbal or    marijuana-based products, and delta-8-tetrahydrocannabinol. Drug Absent but Declared for Prescription Verification   Oxycodone                      Not Detected UNEXPECTED ng/mg creat ==================================================================== Test                      Result    Flag   Units      Ref Range   Creatinine              230              mg/dL      >=16 ==================================================================== Declared Medications:  The flagging and interpretation on this report are based on the  following declared medications.  Unexpected results may arise from  inaccuracies in the declared medications.  **Note: The testing scope of this panel includes these medications:  Oxycodone (Roxicodone)  **Note: The testing scope of this panel does not include the  following reported medications:  Albuterol (Ventolin HFA)  Amlodipine (Norvasc)  Atorvastatin (Lipitor)  Cetirizine  Diphenhydramine (Benadryl)  Epinephrine (EpiPen)  Fluticasone (Flonase)  Gabapentin (Neurontin)  Hydrochlorothiazide  Losartan (Cozaar)  Meloxicam (Mobic)  Methocarbamol (Robaxin)  Montelukast (Singulair)  Norethindrone  (Aygestin)  Pantoprazole (Protonix)  Vitamin D3 ==================================================================== For clinical consultation, please call [phone]. ====================================================================       ROS  Constitutional: Denies any fever or chills Gastrointestinal: No reported hemesis, hematochezia, vomiting, or acute GI distress Musculoskeletal:  Polyarthralgias and polymyalgias/ Neurological: No reported episodes of acute onset apraxia, aphasia, dysarthria, agnosia, amnesia, paralysis, loss of coordination, or loss of consciousness  Medication Review  Cyanocobalamin, DULoxetine, EPINEPHrine, Vitamin D-3, albuterol, atorvastatin, buprenorphine, cetirizine, dicyclomine, diphenhydrAMINE, fluconazole, fluticasone, hydrochlorothiazide, hydrocortisone, losartan, meloxicam, methocarbamol, montelukast, pantoprazole, polyethylene glycol powder, potassium chloride SA, pregabalin, and saccharomyces boulardii  History Review  Allergy: Ms. Mano is allergic to other, pineapple, shellfish allergy, strawberry (diagnostic), and tomato. Drug: Ms. Fogelman  reports no history of drug use. Alcohol:  reports current alcohol use of about 2.0 standard drinks of alcohol per week. Tobacco:  reports that she has never smoked. She has never used smokeless tobacco. Social: Ms. Coronado  reports that she has never smoked. She has never used smokeless tobacco. She reports current alcohol use of  about 2.0 standard drinks of alcohol per week. She reports that she does not use drugs. 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), Adrenal nodule (HCC) (06/20/2020), Allergy, ANA positive (02/01/2021), Anxiety and depression (01/29/2021),  Arthritis, Back pain with history of spinal surgery (01/29/2021), Bilateral carpal tunnel syndrome (01/29/2021), Bilateral leg edema (01/29/2021), BMI 40.0-44.9, adult (HCC) (04/30/2022), Carpal boss, right, Carpal tunnel syndrome, bilateral, 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) (09/27/2018), Cervical facet hypertrophy (C3-T1) (09/27/2018), Cervical facet joint syndrome (Bilateral) (L>R) (09/27/2018), 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-5, C5-6, and C6-7) (09/27/2018), Cervical myelopathy (HCC) (07/01/2017), Cervicalgia (Primary Area of Pain) (Bilateral) (L>R) (09/27/2018), Chronic gout of foot (Left) (05/31/2018), Chronic low back pain (Tertiary Area of Pain) (Bilateral) (R>L) w/ sciatica (Bilateral) (09/07/2018), Chronic low back pain (Tertiary Area of Pain) (Bilateral) (R>L) w/o sciatica (07/26/2018), Chronic lower extremity pain (Fourth Area of Pain) (Bilateral) (R>L) (09/07/2018), Chronic musculoskeletal pain (10/27/2018), Chronic neck pain (Bilateral) w/ history of cervical spinal surgery (09/27/2018), Chronic neck pain (Primary Area of Pain) (Bilateral) (L>R) (09/07/2018), Chronic sacroiliac joint dysfunction (Bilateral) (09/27/2018), Chronic sacroiliac joint pain (Right) (09/07/2018), Chronic upper extremity pain (Secondary Area of Pain) (Bilateral) (R>L) (09/27/2018), Disorder of skeletal system (09/07/2018), E. coli UTI (urinary tract infection) (06/20/2020), Elevated sed rate (09/08/2018), Elevated serum creatinine (06/20/2020), Excessive daytime sleepiness (03/06/2020), Female pelvic pain (09/05/2020), GERD (gastroesophageal reflux disease), Headache, History of allergy to shellfish (09/28/2018), History of fusion of cervical spine (ACDF C4-C7) (09/27/2018), History of illicit drug use (03/07/2020), Hives (09/05/2020), Hives (09/05/2020), Hyperkalemia (05/31/2018), Hyperlipidemia (04/30/2022), Hypertension, Large breasts (05/30/2020), Left hip pain (07/21/2022), Lumbar facet  arthropathy (04/20/2019), Lumbar radiculitis (L5 dermatome) (Right) (04/17/2020), Multiple environmental allergies (01/17/2020), Multiple food allergies (03/07/2020), Neurogenic pain (10/27/2018), Numbness and tingling of both feet (05/31/2018), 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), Other intervertebral disc degeneration, 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), Pharmacologic therapy (09/07/2018), Plantar fasciitis, bilateral (03/04/2021), Polyneuropathy (01/29/2021), Prediabetes (10/10/2019), Problems influencing health status (09/07/2018), Rash (08/08/2019), Seasonal allergic rhinitis due to pollen (11/29/2018), Seasonal allergies (03/06/2020), Seasonal asthma (11/29/2018), Sleep apnea, Somatic dysfunction of sacroiliac joint (Bilateral) (09/27/2018), Spondylosis without myelopathy or radiculopathy, cervical region (03/22/2019), Spondylosis without myelopathy or radiculopathy, lumbosacral region (05/19/2019), Spondylosis, cervical, 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 Surgical: Ms. Dillahunty  has a past surgical history that includes Knee arthroscopy (Right); Anterior cervical decomp/discectomy fusion (N/A, 07/01/2017); Abdominal hysterectomy (10/17/2020); and Carpal tunnel release (Bilateral). Family: family history includes [name] in her mother; Arthritis in her father, mother, and sister; Breast cancer in her maternal aunt and paternal aunt; COPD in her brother and son; Cancer in her brother; Depression in her son; Diabetes in her mother; Early death in her brother; Hearing loss in her father; Hyperlipidemia in her mother; Hypertension in her father, mother, and sister; Kidney disease in her father; Lymphoma in her father; Sleep apnea in her daughter.  Laboratory Chemistry Profile   Renal Lab Results  Component Value Date   BUN 13 07/22/2023   CREATININE 0.93 07/22/2023   BCR NOT APPLICABLE 02/13/2021   GFR 71.91 07/22/2023   GFRAA 76 09/07/2018   GFRNONAA 66  09/07/2018    Hepatic Lab Results  Component Value Date   AST 11 07/22/2023   ALT 9 07/22/2023   ALBUMIN 4.1 07/22/2023   ALKPHOS 58 07/22/2023   LIPASE 32.0 06/30/2022    Electrolytes Lab Results  Component Value Date  NA 136 07/22/2023   K 3.4 (L) 07/22/2023   CL 102 07/22/2023   CALCIUM 9.1 07/22/2023   MG 2.2 09/07/2018    Bone Lab Results  Component Value Date   VD25OH 29.35 (L) 04/08/2023   25OHVITD1 8.1 (L) 09/07/2018   25OHVITD2 <1.0 09/07/2018   25OHVITD3 8.1 09/07/2018    Inflammation (CRP: Acute Phase) (ESR: Chronic Phase) Lab Results  Component Value Date   CRP <1.0 07/22/2023   ESRSEDRATE 57 (H) 07/22/2023         Note: Above Lab results reviewed.  Recent Imaging Review  CT ABDOMEN PELVIS W CONTRAST CLINICAL DATA:  Worsening abdominal pain and bloating for 1 month.  EXAM: CT ABDOMEN AND PELVIS WITH CONTRAST  TECHNIQUE: Multidetector CT imaging of the abdomen and pelvis was performed using the standard protocol following bolus administration of intravenous contrast.  RADIATION DOSE REDUCTION: This exam was performed according to the departmental dose-optimization program which includes automated exposure control, adjustment of the mA and/or kV according to patient size and/or use of iterative reconstruction technique.  CONTRAST:  ISOVUE-300 IOPAMIDOL (ISOVUE-300) INJECTION 61%  COMPARISON:  06/30/2022  FINDINGS: Lower Chest: No acute findings.  Hepatobiliary: No suspicious hepatic masses identified. 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  Pancreas:  No mass or inflammatory changes.  Spleen: Within normal limits in size and appearance.  Adrenals/Urinary Tract: A 1.7 cm left adrenal mass remains stable, consistent with a benign adenoma (No followup imaging is recommended). No suspicious renal masses identified. No evidence  of ureteral calculi or hydronephrosis. Unremarkable unopacified urinary bladder.  Stomach/Bowel: No evidence of obstruction, inflammatory process or abnormal fluid collections. Normal appendix visualized.  Vascular/Lymphatic: No pathologically enlarged lymph nodes. No acute vascular findings.  Reproductive: Prior hysterectomy noted. 1.6 cm simple appearing right ovarian cyst is seen, which is most likely physiologic. No evidence of inflammatory changes or free fluid.  Other:  None.  Musculoskeletal:  No suspicious bone lesions identified.  : 1.6 cm simple appearing right ovarian cyst, which is most likely physiologic. No follow-up imaging recommended. Reference: JACR 2020 Feb; 17(2):248-254  Electronically Signed   By: Danae Orleans M.D.   On: 07/30/2023 13:40 Note: Reviewed        Physical Exam  General appearance: Well nourished, well developed, and well hydrated. In no apparent acute distress Mental status: Alert, oriented x 3 (person, place, & time)       Respiratory: No evidence of acute respiratory distress Eyes: PERLA Vitals: BP (!) 143/76   Pulse 66   Temp (!) 97.4 F (36.3 C)   Resp 16   Ht 5\' 3"  (1.6 m)   Wt 238 lb (108 kg)   LMP  (LMP Unknown)   SpO2 99%   BMI 42.16 kg/m  BMI: Estimated body mass index is 42.16 kg/m as calculated from the following:   Height as of this encounter: 5\' 3"  (1.6 m).   Weight as of this encounter: 238 lb (108 kg). Ideal: Ideal body weight: 52.4 kg (115 lb 8.3 oz) Adjusted ideal body weight: 74.6 kg (164 lb 8.2 oz)  Assessment   Diagnosis Status  1. Lumbar facet arthropathy   2. Lumbar facet syndrome (Bilateral) (R>L)   3. Chronic low back pain [facility] Area of Pain) (Bilateral) (R>L) w/ sciatica (Bilateral)   4. Chronic pain syndrome    Controlled Controlled Controlled   Updated Problems: No problems updated.  Plan of Care  Problem-specific:  Assessment and Plan    Pain Management Currently using a Butrans  patch (15 mcg/hr) for pain management, with reports of drowsiness and burning at the application site. No other significant side effects. Discussed using Eucerin cream to alleviate burning. Continue the Butrans patch at the same dosage and apply Eucerin cream to the application site. Check with the pharmacy regarding refills and expiration of the Butrans patch. Ensure multiple refills for Lyrica.  Fibromyalgia This chronic condition is characterized by widespread musculoskeletal pain, fatigue, and tenderness. It is managed with Cymbalta (30 mg twice daily) and Lyrica. No new symptoms reported. Continue Cymbalta and Lyrica. Encourage physical activity and the use of heat therapy.  Inflammatory Arthritis Diagnosed with inflammatory arthritis with fluid on the spine. Symptoms may overlap with fibromyalgia. Further evaluation by a rheumatologist is recommended. Refer to a rheumatologist for further evaluation and management.  Sjogren's Syndrome An autoimmune condition presenting with dryness in the eyes and mouth, swelling, and joint pains. Requires specialized management. Refer to a rheumatologist for further evaluation and management.  Follow-up Contact the pharmacy to confirm refills and expiration dates for the [facility] patch. If necessary, send in new prescriptions for the next three months.        Follow-up plan:   Return in about 4 months (around 01/20/2024) for PPE, F2F.     Recent Visits Date Type Provider Dept  07/27/23 Office Visit Edward Jolly, MD [facility]  Showing recent visits within past 90 days and meeting all other requirements Today's Visits Date Type Provider Dept  09/22/23 Office Visit Edward Jolly, MD [facility]  Showing today's visits and meeting all other requirements Future Appointments No visits were found meeting these conditions. Showing future appointments within next 90 days and meeting all other requirements  I discussed the  assessment and treatment plan with the patient. The patient was provided an opportunity to ask questions and all were answered. The patient agreed with the plan and demonstrated an understanding of the instructions.  Patient advised to call back or seek an in-person evaluation if the symptoms or condition worsens.  Duration of encounter: .  Total time on encounter, as per AMA guidelines included both the face-to-face and non-face-to-face time personally spent by the physician and/or other qualified health care professional(s) on the day of the encounter (includes time in activities that require the physician or other qualified health care professional and does not include time in activities normally performed by clinical staff). Physician's time may include the following activities when performed: Preparing to see the patient (e.g., pre-charting review of records, searching for previously ordered imaging, lab work, and nerve conduction tests) Review of prior analgesic pharmacotherapies. Reviewing PMP Interpreting ordered tests (e.g., lab work, imaging, nerve conduction tests) Performing post-procedure evaluations, including interpretation of diagnostic procedures Obtaining and/or reviewing separately obtained history Performing a medically appropriate examination and/or evaluation Counseling and educating the patient/family/caregiver Ordering medications, tests, or procedures Referring and communicating with other health care professionals (when not separately reported) Documenting clinical information in the electronic or other health record Independently interpreting results (not separately reported) and communicating results to the patient/ family/caregiver Care coordination (not separately reported)  Note by: Edward Jolly, MD Date: 09/22/2023; Time: 12:13 PM

## 2023-09-26 ENCOUNTER — Other Ambulatory Visit: Payer: Self-pay | Admitting: Pain Medicine

## 2023-09-26 DIAGNOSIS — G8929 Other chronic pain: Secondary | ICD-10-CM

## 2023-09-28 ENCOUNTER — Encounter: Payer: Self-pay | Admitting: Nurse Practitioner

## 2023-09-30 ENCOUNTER — Encounter: Payer: Self-pay | Admitting: Nurse Practitioner

## 2023-09-30 ENCOUNTER — Encounter: Payer: Self-pay | Admitting: Student in an Organized Health Care Education/Training Program

## 2023-09-30 ENCOUNTER — Ambulatory Visit (INDEPENDENT_AMBULATORY_CARE_PROVIDER_SITE_OTHER): Payer: 59 | Admitting: Nurse Practitioner

## 2023-09-30 VITALS — BP 160/92 | HR 73 | Temp 98.2°F | Ht 63.0 in | Wt 239.0 lb

## 2023-09-30 DIAGNOSIS — F410 Panic disorder [episodic paroxysmal anxiety] without agoraphobia: Secondary | ICD-10-CM

## 2023-09-30 DIAGNOSIS — G894 Chronic pain syndrome: Secondary | ICD-10-CM

## 2023-09-30 DIAGNOSIS — I10 Essential (primary) hypertension: Secondary | ICD-10-CM

## 2023-09-30 DIAGNOSIS — F39 Unspecified mood [affective] disorder: Secondary | ICD-10-CM | POA: Diagnosis not present

## 2023-09-30 DIAGNOSIS — F411 Generalized anxiety disorder: Secondary | ICD-10-CM | POA: Insufficient documentation

## 2023-09-30 MED ORDER — HYDROXYZINE PAMOATE 25 MG PO CAPS: 25.0000 mg | ORAL_CAPSULE | Freq: Four times a day (QID) | ORAL | 0 refills | Status: DC | PRN

## 2023-09-30 MED ORDER — BUSPIRONE HCL 7.5 MG PO TABS: 7.5000 mg | ORAL_TABLET | Freq: Two times a day (BID) | ORAL | 5 refills | Status: DC

## 2023-09-30 MED ORDER — HYDROCHLOROTHIAZIDE 25 MG PO TABS: 25.0000 mg | ORAL_TABLET | Freq: Every day | ORAL | 3 refills | Status: DC

## 2023-09-30 MED ORDER — AMLODIPINE BESYLATE 2.5 MG PO TABS: 2.5000 mg | ORAL_TABLET | Freq: Every day | ORAL | 0 refills | Status: DC

## 2023-09-30 NOTE — Assessment & Plan Note (Signed)
Increased stressors and panic attacks are present. GAD- 20 today. Start Buspar 7.5mg  twice daily for anxiety and Vistaril 25mg  Q6 as needed for panic attacks. Continue Cymbalta. Attend the scheduled therapy appointment in March. Counseled patient on common side effects. Encouraged to contact if worsening symptoms, unusual behavior changes or suicidal thoughts occur.

## 2023-09-30 NOTE — Assessment & Plan Note (Signed)
Worsening symptoms. Currently taking Cymbalta 30mg  BID. PHQ- 19 today. Continue Cymbalta. Denies SI/HI. See plan for anxiety.

## 2023-09-30 NOTE — Assessment & Plan Note (Signed)
Significant pain is reported and managed by a pain management specialist. Continue follow-up with pain management and discuss disability paperwork with them.

## 2023-09-30 NOTE — Assessment & Plan Note (Signed)
Blood pressure remaining consistently elevated. Increased stressors recently. She has been taking her Losartan 100mg  daily and Hydrochlorothiazide 25 mg daily. We will add Norvasc 2.5 mg daily and monitor for side effects, hx of mild pedal edema with 5mg  dose. Counseled on ways to help manage stress. She will follow up in 4 weeks to monitor. Return precautions given to patient.

## 2023-09-30 NOTE — Progress Notes (Signed)
[name], NP-C Phone: [phone]  Anita Gibbs is a 53 y.o. female who presents today for anxiety and depression.   Discussed the use of AI scribe software for clinical note transcription with the patient, who gave verbal consent to proceed.  History of Present Illness   The patient presents with increased anxiety and panic attacks.  She experiences heightened anxiety and frequent panic attacks, particularly upon waking, with symptoms including shortness of breath. She feels overwhelmed by stress related to health and home issues. She is currently taking Cymbalta for mood management.  She has a history of high blood pressure, which she manages with medication. Recently, her blood pressure readings have been significantly elevated, which she attributes to increased stress.  She has recently established care with rheumatology and pain management for ongoing pain issues. She reports that pain management has not been very effective, only providing some modification of her symptoms.  She is unemployed, having previously worked as a Surveyor, mining. She stopped working due to significant pain and concerns about her ability to perform her duties safely. She is in the process of applying for disability benefits with the assistance of a lawyer.      Social History   Tobacco Use  Smoking Status Never  Smokeless Tobacco Never    Current Outpatient Medications on File Prior to Visit  Medication Sig Dispense Refill   albuterol (VENTOLIN HFA) 108 (90 Base) MCG/ACT inhaler Inhale 2 puffs into the lungs every 6 (six) hours as needed for wheezing or shortness of breath. 1 each 11   atorvastatin (LIPITOR) 10 MG tablet MWF at night 90 tablet 3   buprenorphine (BUTRANS) 15 MCG/HR Place 1 patch onto the skin once a week. 4 patch 3   cetirizine (ZYRTEC ALLERGY) 10 MG tablet Take 1 tablet (10 mg total) by mouth at bedtime as needed. 90 tablet 3   Cholecalciferol (VITAMIN D-3) 125 MCG (5000 UT)  TABS Take by mouth daily.     Cyanocobalamin (B-12 PO) Take by mouth.     dicyclomine (BENTYL) 20 MG tablet Take 1 tablet (20 mg total) by mouth 4 (four) times daily -  before meals and at bedtime. 120 tablet 1   DULoxetine (CYMBALTA) 30 MG capsule Take 1 capsule (30 mg total) by mouth 2 (two) times daily. 180 capsule 3   EPINEPHrine (AUVI-Q) 0.3 mg/0.3 mL IJ SOAJ injection Use as directed for severe allergic reaction 2 Device 2   fluconazole (DIFLUCAN) 150 MG tablet Take 1 tablet by mouth x 1 dose. May repeat in 72 hours if needed. 2 tablet 0   fluticasone (FLONASE) 50 MCG/ACT nasal spray Place 2 sprays into both nostrils daily. 16 g 6   hydrocortisone 2.5 % cream Apply topically 2 (two) times daily. Prn to eyelids 30 g 0   losartan (COZAAR) 100 MG tablet Take 1 tablet (100 mg total) by mouth daily. 90 tablet 3   meloxicam (MOBIC) 15 MG tablet Take 1 tablet (15 mg total) by mouth every other day as needed. 45 tablet 11   methocarbamol (ROBAXIN) 750 MG tablet Take 1 tablet (750 mg total) by mouth every 8 (eight) hours as needed for muscle spasms. 90 tablet 11   montelukast (SINGULAIR) 10 MG tablet Take 1 tablet (10 mg total) by mouth at bedtime. 90 tablet 3   pantoprazole (PROTONIX) 40 MG tablet TAKE ONE TABLET EVERY DAY 30 MIN BEFORE DINNER 90 tablet 3   polyethylene glycol powder (GLYCOLAX/MIRALAX) 17 GM/SCOOP powder Take 17  g by mouth 2 (two) times daily as needed. 3350 g 1   potassium chloride SA (KLOR-CON M) 20 MEQ tablet Take 1 tablet (20 mEq total) by mouth daily as needed. With lasix 30 tablet 11   pregabalin (LYRICA) 75 MG capsule Take 1 capsule (75 mg total) by mouth 3 (three) times daily. 90 capsule 11   saccharomyces boulardii (FLORASTOR) 250 MG capsule Take 1 capsule (250 mg total) by mouth daily. 90 capsule 0   No current facility-administered medications on file prior to visit.     ROS see history of present illness  Objective  Physical Exam Vitals:   09/30/23 1310 09/30/23  1325  BP: (!) 144/100 (!) 160/92  Pulse: 86 73  Temp: 98.2 F (36.8 C)   SpO2: 96%     BP Readings from Last 3 Encounters:  09/30/23 (!) 160/92  09/22/23 (!) 143/76  08/31/23 (!) 144/86   Wt Readings from Last 3 Encounters:  09/30/23 239 lb (108.4 kg)  09/22/23 238 lb (108 kg)  08/31/23 245 lb 8 oz (111.4 kg)    Physical Exam Constitutional:      General: She is not in acute distress.    Appearance: Normal appearance.  HENT:     Head: Normocephalic.  Cardiovascular:     Rate and Rhythm: Normal rate and regular rhythm.     Heart sounds: Normal heart sounds.  Pulmonary:     Effort: Pulmonary effort is normal.     Breath sounds: Normal breath sounds.  Skin:    General: Skin is warm and dry.  Neurological:     General: No focal deficit present.     Mental Status: She is alert.  Psychiatric:        Mood and Affect: Mood is anxious. Affect is tearful.        Behavior: Behavior normal.     Assessment/Plan: Please see individual problem list.  Generalized anxiety disorder with panic attacks Assessment & Plan: Increased stressors and panic attacks are present. GAD- 20 today. Start Buspar 7.5mg  twice daily for anxiety and Vistaril 25mg  Q6 as needed for panic attacks. Continue Cymbalta. Attend the scheduled therapy appointment in March. Counseled patient on common side effects. Encouraged to contact if worsening symptoms, unusual behavior changes or suicidal thoughts occur.   Orders: -     busPIRone HCl; Take 1 tablet (7.5 mg total) by mouth 2 (two) times daily.  Dispense: 60 tablet; Refill: 5 -     hydrOXYzine Pamoate; Take 1 capsule (25 mg total) by mouth every 6 (six) hours as needed for anxiety.  Dispense: 60 capsule; Refill: 0  Mood disorder (HCC) Assessment & Plan: Worsening symptoms. Currently taking Cymbalta 30mg  BID. PHQ- 19 today. Continue Cymbalta. Denies SI/HI. See plan for anxiety.    Essential hypertension Assessment & Plan: Blood pressure remaining  consistently elevated. Increased stressors recently. She has been taking her Losartan 100mg  daily and Hydrochlorothiazide 25 mg daily. We will add Norvasc 2.5 mg daily and monitor for side effects, hx of mild pedal edema with 5mg  dose. Counseled on ways to help manage stress. She will follow up in 4 weeks to monitor. Return precautions given to patient.   Orders: -     hydroCHLOROthiazide; Take 1 tablet (25 mg total) by mouth daily.  Dispense: 90 tablet; Refill: 3 -     amLODIPine Besylate; Take 1 tablet (2.5 mg total) by mouth daily.  Dispense: 90 tablet; Refill: 0  Chronic pain syndrome Assessment & Plan: Significant pain  is reported and managed by a pain management specialist. Continue follow-up with pain management and discuss disability paperwork with them.     Return in about 4 weeks (around 10/28/2023) for Anxiety/Depression.   [name], NP-C [facility] - [facility]

## 2023-10-03 DIAGNOSIS — Z419 Encounter for procedure for purposes other than remedying health state, unspecified: Secondary | ICD-10-CM | POA: Diagnosis not present

## 2023-10-08 ENCOUNTER — Encounter: Payer: Self-pay | Admitting: Nurse Practitioner

## 2023-10-23 ENCOUNTER — Ambulatory Visit (INDEPENDENT_AMBULATORY_CARE_PROVIDER_SITE_OTHER): Payer: 59 | Admitting: Nurse Practitioner

## 2023-10-23 ENCOUNTER — Other Ambulatory Visit: Payer: Self-pay

## 2023-10-23 VITALS — BP 136/86 | HR 80 | Temp 98.2°F | Ht 63.0 in | Wt 246.6 lb

## 2023-10-23 DIAGNOSIS — R0789 Other chest pain: Secondary | ICD-10-CM | POA: Diagnosis not present

## 2023-10-23 DIAGNOSIS — F39 Unspecified mood [affective] disorder: Secondary | ICD-10-CM

## 2023-10-23 DIAGNOSIS — F410 Panic disorder [episodic paroxysmal anxiety] without agoraphobia: Secondary | ICD-10-CM | POA: Diagnosis not present

## 2023-10-23 DIAGNOSIS — J45998 Other asthma: Secondary | ICD-10-CM

## 2023-10-23 DIAGNOSIS — F411 Generalized anxiety disorder: Secondary | ICD-10-CM | POA: Diagnosis not present

## 2023-10-23 DIAGNOSIS — G894 Chronic pain syndrome: Secondary | ICD-10-CM

## 2023-10-23 DIAGNOSIS — I10 Essential (primary) hypertension: Secondary | ICD-10-CM

## 2023-10-23 DIAGNOSIS — G4700 Insomnia, unspecified: Secondary | ICD-10-CM

## 2023-10-23 DIAGNOSIS — R7309 Other abnormal glucose: Secondary | ICD-10-CM

## 2023-10-23 LAB — BASIC METABOLIC PANEL
BUN: 22 mg/dL (ref 6–23)
CO2: 31 meq/L (ref 19–32)
Calcium: 9.1 mg/dL (ref 8.4–10.5)
Chloride: 103 meq/L (ref 96–112)
Creatinine, Ser: 1.16 mg/dL (ref 0.40–1.20)
GFR: 55.06 mL/min — ABNORMAL LOW (ref >60.00)
Glucose, Bld: 110 mg/dL — ABNORMAL HIGH (ref 70–99)
Potassium: 3.8 meq/L (ref 3.5–5.1)
Sodium: 140 meq/L (ref 135–145)

## 2023-10-23 MED ORDER — DULOXETINE HCL 60 MG PO CPEP: 60.0000 mg | ORAL_CAPSULE | Freq: Two times a day (BID) | ORAL | 2 refills | Status: AC

## 2023-10-23 MED ORDER — ALBUTEROL SULFATE HFA 108 (90 BASE) MCG/ACT IN AERS: 2.0000 | INHALATION_SPRAY | Freq: Four times a day (QID) | RESPIRATORY_TRACT | 11 refills | Status: AC | PRN

## 2023-10-23 MED ORDER — BUSPIRONE HCL 10 MG PO TABS: 10.0000 mg | ORAL_TABLET | Freq: Two times a day (BID) | ORAL | 1 refills | Status: DC

## 2023-10-23 NOTE — Progress Notes (Signed)
 Anita Dicker, NP-C Phone: [phone]  Anita Gibbs is a 53 y.o. female who presents today for follow up.   Discussed the use of AI scribe software for clinical note transcription with the patient, who gave verbal consent to proceed.  History of Present Illness   Anita Gibbs is a 52 year old female with anxiety and chronic pain who presents with worsening anxiety and pain symptoms. She is accompanied by her partner.  She experiences worsening anxiety with frequent panic attacks occurring two to three times daily. Her anxiety is more intense, and she feels her current medications, Buspar taken twice daily and Atarax as needed, are ineffective. She wakes up crying at 3:30 AM, has mood swings, and struggles to identify triggers for her anxiety. Recently, she has had thoughts of self-harm, particularly around her father's birthday, the first since his passing.  She has chronic pain due to fibromyalgia and Sjogren's syndrome, managed with Lyrica, muscle relaxers, and Mobic. 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 Pain occurs both when moving and sitting, with temporary relief from pregabalin, muscle relaxers, and ibuprofen. She is concerned about her ability to work and is exploring disability and alternative employment options.  She experiences chest pain localized to the chest without radiation, ongoing for several months. Previous ER visits with extensive testing did not reveal a cause. She also has back pain, which she believes may be related to her chest pain. She has uncontrolled high blood pressure despite being on three medications, including a new one added a month ago. Home monitoring shows persistently high readings.  She reports poor sleep quality, often waking up crying. This impacts her ability to work and her financial situation. She has not worked since her pain became severe.  Her diet is poor, often  going all day without eating and then binge eating, with high salt intake. She experiences shortness of breath, dizziness, and occasional swelling in her left foot, attributing these to high blood pressure and anxiety.      Social History   Tobacco Use  Smoking Status Never  Smokeless Tobacco Never    Current Outpatient Medications on File Prior to Visit  Medication Sig Dispense Refill   amLODipine (NORVASC) 2.5 MG tablet Take 1 tablet (2.5 mg total) by mouth daily. 90 tablet 0   atorvastatin (LIPITOR) 10 MG tablet MWF at night 90 tablet 3   cetirizine (ZYRTEC ALLERGY) 10 MG tablet Take 1 tablet (10 mg total) by mouth at bedtime as needed. 90 tablet 3   Cholecalciferol (VITAMIN D-3) 125 MCG (5000 UT) TABS Take by mouth daily.     Cyanocobalamin (B-12 PO) Take by mouth.     dicyclomine (BENTYL) 20 MG tablet Take 1 tablet (20 mg total) by mouth 4 (four) times daily -  before meals and at bedtime. 120 tablet 1   EPINEPHrine (AUVI-Q) 0.3 mg/0.3 mL IJ SOAJ injection Use as directed for severe allergic reaction 2 Device 2   fluconazole (DIFLUCAN) 150 MG tablet Take 1 tablet by mouth x 1 dose. May repeat in 72 hours if needed. 2 tablet 0   fluticasone (FLONASE) 50 MCG/ACT nasal spray Place 2 sprays into both nostrils daily. 16 g 6   hydrochlorothiazide (HYDRODIURIL) 25 MG tablet Take 1 tablet (25 mg total) by mouth daily. 90 tablet 3   hydrocortisone 2.5 % cream Apply topically 2 (two) times daily. Prn to eyelids 30 g 0  hydrOXYzine (VISTARIL) 25 MG capsule Take 1 capsule (25 mg total) by mouth every 6 (six) hours as needed for anxiety. 60 capsule 0   losartan (COZAAR) 100 MG tablet Take 1 tablet (100 mg total) by mouth daily. 90 tablet 3   meloxicam (MOBIC) 15 MG tablet Take 1 tablet (15 mg total) by mouth every other day as needed. 45 tablet 11   methocarbamol (ROBAXIN) 750 MG tablet Take 1 tablet (750 mg total) by mouth every 8 (eight) hours as needed for muscle spasms. 90 tablet 11    montelukast (SINGULAIR) 10 MG tablet Take 1 tablet (10 mg total) by mouth at bedtime. 90 tablet 3   pantoprazole (PROTONIX) 40 MG tablet TAKE ONE TABLET EVERY DAY 30 MIN BEFORE DINNER 90 tablet 3   polyethylene glycol powder (GLYCOLAX/MIRALAX) 17 GM/SCOOP powder Take 17 g by mouth 2 (two) times daily as needed. 3350 g 1   potassium chloride SA (KLOR-CON M) 20 MEQ tablet Take 1 tablet (20 mEq total) by mouth daily as needed. With lasix 30 tablet 11   pregabalin (LYRICA) 75 MG capsule Take 1 capsule (75 mg total) by mouth 3 (three) times daily. 90 capsule 11   saccharomyces boulardii (FLORASTOR) 250 MG capsule Take 1 capsule (250 mg total) by mouth daily. 90 capsule 0   No current facility-administered medications on file prior to visit.    ROS see history of present illness  Objective  Physical Exam Vitals:   10/23/23 0943 10/23/23 1025  BP: (!) 150/88 136/86  Pulse: 80   Temp: 98.2 F (36.8 C)   SpO2: 100%     BP Readings from Last 3 Encounters:  10/23/23 136/86  09/30/23 (!) 160/92  09/22/23 (!) 143/76   Wt Readings from Last 3 Encounters:  10/23/23 246 lb 9.6 oz (111.9 kg)  09/30/23 239 lb (108.4 kg)  09/22/23 238 lb (108 kg)    Physical Exam Constitutional:      General: She is not in acute distress.    Appearance: Normal appearance.  HENT:     Head: Normocephalic.  Cardiovascular:     Rate and Rhythm: Normal rate and regular rhythm.     Heart sounds: Normal heart sounds.  Pulmonary:     Effort: Pulmonary effort is normal.     Breath sounds: Normal breath sounds.  Skin:    General: Skin is warm and dry.  Neurological:     General: No focal deficit present.     Mental Status: She is alert.  Psychiatric:        Mood and Affect: Mood normal.        Behavior: Behavior normal.     Assessment/Plan: Please see individual problem list.  Mood disorder (HCC) Assessment & Plan: Panic attacks and mood instability have increased despite Buspar use, with no noted  improvement. Suicidal ideation has recently emerged, with no active plan. Patient and partner verbalize safety at home. Increase Buspar and Cymbalta dosage and initiate counseling or therapy. Monitor closely for worsening suicidal ideation and advise immediate hospital visit if this occurs. She will follow up in 4 weeks.   Orders: -     DULoxetine HCl; Take 1 capsule (60 mg total) by mouth 2 (two) times daily.  Dispense: 60 capsule; Refill: 2 -     Ambulatory referral to Psychology  Generalized anxiety disorder with panic attacks Assessment & Plan: See plan for mood disorder. Increasing Buspar and Cymbalta. Continue Vistaril as needed. Referral placed to Psychology to establish with  therapist.   Orders: -     busPIRone HCl; Take 1 tablet (10 mg total) by mouth 2 (two) times daily.  Dispense: 60 tablet; Refill: 1 -     Ambulatory referral to Psychology  Chronic pain syndrome Assessment & Plan: Pain persists despite the current management regimen, contributing to elevated blood pressure and mood instability. Increase Cymbalta dosage, which may also benefit mood. Continue follow-up with pain management and rheumatology, and initiate physical therapy.  Orders: -     Ambulatory referral to Physical Therapy -     DULoxetine HCl; Take 1 capsule (60 mg total) by mouth 2 (two) times daily.  Dispense: 60 capsule; Refill: 2  Essential hypertension Assessment & Plan: Blood pressure remains high despite Losartan 100 mg daily, hydrochlorothiazide 25 mg daily and Norvasc 2.5 mg daily, likely exacerbated by chronic pain and anxiety. Improvement with second BP reading in office. Encourage dietary modifications to reduce salt intake and continue current antihypertensive medications. Hx of mild pedal edema with Norvasc 5mg  dose. She will follow up in 4 weeks to monitor. Return precautions given to patient.   Orders: -     Basic metabolic panel  Other chest pain Assessment & Plan: Recurrent, localized,  non-radiating chest pain has increased in frequency, though previous workup was negative. Consider its relationship to panic attacks. Advise monitoring symptoms and seeking immediate medical attention if symptoms worsen or change.    Seasonal asthma -     Albuterol Sulfate HFA; Inhale 2 puffs into the lungs every 6 (six) hours as needed for wheezing or shortness of breath.  Dispense: 8 g; Refill: 11   Return in about 4 weeks (around 11/20/2023) for Anxiety/Depression.   Anita Dicker, NP-C [facility] - [facility]

## 2023-10-28 ENCOUNTER — Telehealth: Payer: Self-pay | Admitting: Nurse Practitioner

## 2023-10-28 ENCOUNTER — Other Ambulatory Visit: Payer: 59

## 2023-10-28 NOTE — Telephone Encounter (Signed)
 Left message to reschedule lab / lab is closed today

## 2023-10-29 ENCOUNTER — Other Ambulatory Visit: Payer: 59

## 2023-10-29 ENCOUNTER — Encounter: Payer: Self-pay | Admitting: Nurse Practitioner

## 2023-10-29 DIAGNOSIS — R0789 Other chest pain: Secondary | ICD-10-CM | POA: Insufficient documentation

## 2023-10-29 NOTE — Assessment & Plan Note (Signed)
 Blood pressure remains high despite Losartan 100 mg daily, hydrochlorothiazide 25 mg daily and Norvasc 2.5 mg daily, likely exacerbated by chronic pain and anxiety. Improvement with second BP reading in office. Encourage dietary modifications to reduce salt intake and continue current antihypertensive medications. Hx of mild pedal edema with Norvasc 5mg  dose. She will follow up in 4 weeks to monitor. Return precautions given to patient.

## 2023-10-29 NOTE — Assessment & Plan Note (Signed)
 See plan for mood disorder. Increasing Buspar and Cymbalta. Continue Vistaril as needed. Referral placed to Psychology to establish with therapist.

## 2023-10-29 NOTE — Assessment & Plan Note (Signed)
 Recurrent, localized, non-radiating chest pain has increased in frequency, though previous workup was negative. Consider its relationship to panic attacks. Advise monitoring symptoms and seeking immediate medical attention if symptoms worsen or change.

## 2023-10-29 NOTE — Assessment & Plan Note (Signed)
 Pain persists despite the current management regimen, contributing to elevated blood pressure and mood instability. Increase Cymbalta dosage, which may also benefit mood. Continue follow-up with pain management and rheumatology, and initiate physical therapy.

## 2023-10-29 NOTE — Assessment & Plan Note (Addendum)
 Panic attacks and mood instability have increased despite Buspar use, with no noted improvement. Suicidal ideation has recently emerged, with no active plan. Patient and partner verbalize safety at home. Increase Buspar and Cymbalta dosage and initiate counseling or therapy. Monitor closely for worsening suicidal ideation and advise immediate hospital visit if this occurs. She will follow up in 4 weeks.

## 2023-10-31 DIAGNOSIS — Z419 Encounter for procedure for purposes other than remedying health state, unspecified: Secondary | ICD-10-CM | POA: Diagnosis not present

## 2023-11-01 ENCOUNTER — Encounter: Payer: Self-pay | Admitting: Nurse Practitioner

## 2023-11-02 ENCOUNTER — Ambulatory Visit (INDEPENDENT_AMBULATORY_CARE_PROVIDER_SITE_OTHER): Admitting: Internal Medicine

## 2023-11-02 ENCOUNTER — Encounter: Payer: Self-pay | Admitting: Internal Medicine

## 2023-11-02 ENCOUNTER — Other Ambulatory Visit (INDEPENDENT_AMBULATORY_CARE_PROVIDER_SITE_OTHER): Payer: 59

## 2023-11-02 VITALS — BP 120/84 | HR 91 | Temp 98.0°F | Ht 63.0 in | Wt 237.0 lb

## 2023-11-02 DIAGNOSIS — R7303 Prediabetes: Secondary | ICD-10-CM | POA: Diagnosis not present

## 2023-11-02 DIAGNOSIS — F410 Panic disorder [episodic paroxysmal anxiety] without agoraphobia: Secondary | ICD-10-CM | POA: Diagnosis not present

## 2023-11-02 DIAGNOSIS — F411 Generalized anxiety disorder: Secondary | ICD-10-CM | POA: Diagnosis not present

## 2023-11-02 DIAGNOSIS — N179 Acute kidney failure, unspecified: Secondary | ICD-10-CM

## 2023-11-02 DIAGNOSIS — R7309 Other abnormal glucose: Secondary | ICD-10-CM | POA: Diagnosis not present

## 2023-11-02 DIAGNOSIS — I10 Essential (primary) hypertension: Secondary | ICD-10-CM | POA: Diagnosis not present

## 2023-11-02 NOTE — Telephone Encounter (Signed)
 Patient is scheduled to see Dr. Heide Spark today at 1:00.

## 2023-11-02 NOTE — Patient Instructions (Addendum)
-  Was a pleasure meeting you today -I do think that your episode of syncope during a panic attack is likely related to the panic attack secondary to hyperventilation -I remain concerned by the increased frequency of your panic attacks despite increased doses of your medications. -I think you will benefit from referral to psychiatry for management of this issue and I will place this order today -You were noted to have a decreased kidney function at your last visit and since you do feel lightheaded while standing you may have a component of dehydration contributing to your syncope.  I will recheck your kidney function today. -You have a history of prediabetes with your last A1c being in that range.  We will recheck an A1c today especially since you are having increased thirst and increased urination to ensure that this is not contributing to your symptoms -Your blood pressure did decrease while going from sitting to standing.  This is usually a sign of being dehydrated.  I would hold your hydrochlorothiazide for now -Please call us with any questions or concerns and follow-up with your PCP next week

## 2023-11-02 NOTE — Assessment & Plan Note (Addendum)
-  Patient noted to have orthostatic hypotension today and feels lightheaded while standing -She is on HCTZ, losartan and amlodipine -Will hold her HCTZ at this time and have her follow-up with her PCP next week -No further workup at this time

## 2023-11-02 NOTE — Progress Notes (Signed)
 Acute Office Visit  Subjective:     Patient ID: Anita Gibbs, female    DOB: Dec 30, 1970, 53 y.o.   MRN: [id number]  Chief Complaint  Patient presents with   Acute Visit    Panic attacks & passing out    HPI Patient is in today for worsening anxiety and panic attacks and an episode of syncope.  Patient states that she saw her PCP on February 21 and had her doses for anxiety medications increased (BuSpar and Cymbalta).  However, since that visit patient has had persistent panic attacks occurring 2-3 times a day.  She had an episode of syncope during one of her panic attacks witnessed by her partner.  He said the episode lasted proxy 1 minute and he had to "smack" her to wake her up.  Patient also states that she feels lightheaded while standing over the last couple of weeks and complains of increased thirst, dry mouth and increased urination.  She does admit to recently stressors with her children and grandchildren as well as her father passing away recently.  Review of Systems  Constitutional: Negative.   HENT: Negative.    Respiratory: Negative.    Cardiovascular: Negative.   Musculoskeletal: Negative.   Neurological:  Positive for dizziness, tingling and loss of consciousness.       Complains of tingling over her left lower extremity  Psychiatric/Behavioral:  The patient is nervous/anxious.         Objective:    BP 112/72   Pulse 79   Temp 98 F (36.7 C)   Ht 5\' 3"  (1.6 m)   Wt 237 lb (107.5 kg)   LMP  (LMP Unknown)   SpO2 99%   BMI 41.98 kg/m    Physical Exam Constitutional:      Appearance: Normal appearance.  HENT:     Head: Normocephalic and atraumatic.  Neurological:     Mental Status: She is alert and oriented to person, place, and time.     Cranial Nerves: No cranial nerve deficit.     Motor: No weakness.     Coordination: Coordination normal.     Gait: Gait normal.     Comments: Power 5 out of 5 in bilateral upper and lower extremities.  SCM  strength intact.  Tongue central with protrusion.  Extraocular movements intact.  No ptosis noted.  Intermittent tremors noted  Psychiatric:     Comments: Appears anxious and occasionally tremulous     No results found for any visits on 11/02/23.      Assessment & Plan:   Problem List Items Addressed This Visit       Cardiovascular and Mediastinum   Essential hypertension   -Patient noted to have orthostatic hypotension today and feels lightheaded while standing -She is on HCTZ, losartan and amlodipine -Will hold her HCTZ at this time and have her follow-up with her PCP next week -No further workup at this time        Genitourinary   AKI (acute kidney injury) (HCC)   -Patient noted to have decreased GFR in the 50s at her last visit from the 70s at baseline -Patient does complain of increased thirst and increased urination and I suspect that this is likely prerenal secondary to dehydration -Will obtain repeat BMP today for follow-up      Relevant Orders   Basic metabolic panel     Other   Generalized anxiety disorder with panic attacks   -Patient noted to have increased panic attacks  with 1 episode of syncope likely secondary to hyperventilation -She did have the doses of her BuSpar and Cymbalta increased at her last visit -Despite the increased dose patient states that her anxiety is poorly controlled and her GAD score today is 21 -I do believe the patient would benefit from referral to psychiatry given her worsening hide despite increased doses of antianxiety meds -I will put in referral to psychiatry today.  She would also benefit in therapy and has already had referral placed for psychology -No further workup at this time      Relevant Orders   Ambulatory referral to Psychiatry   Prediabetes - Primary   -Patient complaining of polyuria and polydipsia as well as feeling lightheaded while standing -She did have an A1c in the prediabetic range last year -Will recheck  an A1c given her symptoms and history of prediabetes -No further workup at this time      Relevant Orders   POCT glycosylated hemoglobin (Hb A1C)    No orders of the defined types were placed in this encounter.   Return in about 1 week (around 11/09/2023).  Earl Lagos, MD

## 2023-11-02 NOTE — Assessment & Plan Note (Signed)
-  Patient noted to have increased panic attacks with 1 episode of syncope likely secondary to hyperventilation -She did have the doses of her BuSpar and Cymbalta increased at her last visit -Despite the increased dose patient states that her anxiety is poorly controlled and her GAD score today is 21 -I do believe the patient would benefit from referral to psychiatry given her worsening hide despite increased doses of antianxiety meds -I will put in referral to psychiatry today.  She would also benefit in therapy and has already had referral placed for psychology -No further workup at this time

## 2023-11-02 NOTE — Assessment & Plan Note (Signed)
-  Patient noted to have decreased GFR in the 50s at her last visit from the 70s at baseline -Patient does complain of increased thirst and increased urination and I suspect that this is likely prerenal secondary to dehydration -Will obtain repeat BMP today for follow-up

## 2023-11-02 NOTE — Assessment & Plan Note (Signed)
-  Patient complaining of polyuria and polydipsia as well as feeling lightheaded while standing -She did have an A1c in the prediabetic range last year -Will recheck an A1c given her symptoms and history of prediabetes -No further workup at this time

## 2023-11-03 LAB — BASIC METABOLIC PANEL
BUN: 18 mg/dL (ref 6–23)
CO2: 28 meq/L (ref 19–32)
Calcium: 9.9 mg/dL (ref 8.4–10.5)
Chloride: 98 meq/L (ref 96–112)
Creatinine, Ser: 1.16 mg/dL (ref 0.40–1.20)
GFR: 55.05 mL/min — ABNORMAL LOW (ref >60.00)
Glucose, Bld: 112 mg/dL — ABNORMAL HIGH (ref 70–99)
Potassium: 3.7 meq/L (ref 3.5–5.1)
Sodium: 136 meq/L (ref 135–145)

## 2023-11-03 LAB — HEMOGLOBIN A1C: Hgb A1c MFr Bld: 6.1 % (ref 4.6–6.5)

## 2023-11-04 ENCOUNTER — Telehealth: Payer: Self-pay | Admitting: Internal Medicine

## 2023-11-04 NOTE — Telephone Encounter (Signed)
 I called the patient discussed results of her blood work with her.  Patient's creatinine is unchanged and her GFR is still low in the 50s.  I suspect this is still likely prerenal but she has an appointment with her PCP on Monday and will follow-up with her at that time.  Her A1c is still in the prediabetic range.  Patient states that she still having panic attacks and passed out again last night while having a panic attack.  I suspect this likely secondary to hyperventilation during a panic attack but patient will follow-up with her PCP for further evaluation.  I did explain to the patient if this continues to recur that she should make an urgent appointment to see [place] or follow-up in the ED.  Patient expressed understanding and is agreement with plan.

## 2023-11-05 DIAGNOSIS — M47819 Spondylosis without myelopathy or radiculopathy, site unspecified: Secondary | ICD-10-CM | POA: Diagnosis not present

## 2023-11-05 DIAGNOSIS — Z79899 Other long term (current) drug therapy: Secondary | ICD-10-CM | POA: Diagnosis not present

## 2023-11-05 DIAGNOSIS — M3500 Sicca syndrome, unspecified: Secondary | ICD-10-CM | POA: Diagnosis not present

## 2023-11-09 ENCOUNTER — Encounter: Payer: Self-pay | Admitting: Nurse Practitioner

## 2023-11-09 ENCOUNTER — Ambulatory Visit (INDEPENDENT_AMBULATORY_CARE_PROVIDER_SITE_OTHER): Admitting: Nurse Practitioner

## 2023-11-09 VITALS — BP 128/84 | HR 77 | Temp 98.3°F | Ht 63.0 in | Wt 241.4 lb

## 2023-11-09 DIAGNOSIS — N179 Acute kidney failure, unspecified: Secondary | ICD-10-CM

## 2023-11-09 DIAGNOSIS — F411 Generalized anxiety disorder: Secondary | ICD-10-CM

## 2023-11-09 DIAGNOSIS — R7303 Prediabetes: Secondary | ICD-10-CM | POA: Diagnosis not present

## 2023-11-09 DIAGNOSIS — F39 Unspecified mood [affective] disorder: Secondary | ICD-10-CM | POA: Diagnosis not present

## 2023-11-09 DIAGNOSIS — F410 Panic disorder [episodic paroxysmal anxiety] without agoraphobia: Secondary | ICD-10-CM

## 2023-11-09 DIAGNOSIS — I10 Essential (primary) hypertension: Secondary | ICD-10-CM | POA: Diagnosis not present

## 2023-11-09 LAB — BASIC METABOLIC PANEL
BUN: 20 mg/dL (ref 6–23)
CO2: 28 meq/L (ref 19–32)
Calcium: 9.3 mg/dL (ref 8.4–10.5)
Chloride: 103 meq/L (ref 96–112)
Creatinine, Ser: 1.03 mg/dL (ref 0.40–1.20)
GFR: 63.48 mL/min (ref >60.00)
Glucose, Bld: 118 mg/dL — ABNORMAL HIGH (ref 70–99)
Potassium: 3.5 meq/L (ref 3.5–5.1)
Sodium: 140 meq/L (ref 135–145)

## 2023-11-09 MED ORDER — AMLODIPINE BESYLATE 5 MG PO TABS: 5.0000 mg | ORAL_TABLET | Freq: Every day | ORAL | 0 refills | Status: AC

## 2023-11-09 MED ORDER — HYDROXYZINE PAMOATE 25 MG PO CAPS
25.0000 mg | ORAL_CAPSULE | Freq: Three times a day (TID) | ORAL | 2 refills | Status: AC
Start: 2023-11-09 — End: ?
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
## 2023-11-09 NOTE — Assessment & Plan Note (Signed)
 See GAD plan. Continue Buspar and Cymbalta. Appointment to establish with Behavioral Health on March 20th, referral previously placed to Psychiatry. Encouraged to contact if worsening symptoms, unusual behavior changes or suicidal thoughts occur. Follow up in 2 weeks.

## 2023-11-09 NOTE — Assessment & Plan Note (Addendum)
 Severe panic attacks continue despite current medication. Behavioral health and psychiatry referrals are in place for further management. Advised to start taking Vistaril three times daily. Counseled on common side effects. Continue Buspar and Cymbalta as prescribed. Attend the psychologist appointment on the 20th and follow up with psychiatry for medication management. Follow up in 2 weeks.

## 2023-11-09 NOTE — Assessment & Plan Note (Signed)
 A1c indicates prediabetes at 6.1. Implement dietary changes to reduce carbohydrate and sugar intake. Encourage physical activity within her capabilities to prevent progression. We will continue to monitor.

## 2023-11-09 NOTE — Assessment & Plan Note (Signed)
 Blood pressure elevated initially with improvement on second reading. Hydrochlorothiazide was discontinued due to kidney concerns. Increase Norvasc (amlodipine) to 5 mg daily and monitor for peripheral edema. Continue Losartan 100 mg as prescribed. We will check BMP today. Follow up in 2 weeks.

## 2023-11-09 NOTE — Assessment & Plan Note (Signed)
 Kidney function has decreased, likely due to dehydration and hydrochlorothiazide use. Meloxicam use is a concern for kidney health. Recheck kidney function with BMP today and ensure adequate hydration. Advised to choose between meloxicam or ibuprofen for pain management, but do not take both.

## 2023-11-16 DIAGNOSIS — G8929 Other chronic pain: Secondary | ICD-10-CM | POA: Diagnosis not present

## 2023-11-16 DIAGNOSIS — M47816 Spondylosis without myelopathy or radiculopathy, lumbar region: Secondary | ICD-10-CM | POA: Diagnosis not present

## 2023-11-16 DIAGNOSIS — R262 Difficulty in walking, not elsewhere classified: Secondary | ICD-10-CM | POA: Diagnosis not present

## 2023-11-18 ENCOUNTER — Emergency Department

## 2023-11-18 ENCOUNTER — Encounter: Payer: Self-pay | Admitting: Emergency Medicine

## 2023-11-18 ENCOUNTER — Emergency Department
Admission: EM | Admit: 2023-11-18 | Discharge: 2023-11-18 | Disposition: A | Discharge status: Home / Self Care | Attending: Emergency Medicine | Admitting: Emergency Medicine

## 2023-11-18 ENCOUNTER — Other Ambulatory Visit: Payer: Self-pay

## 2023-11-18 DIAGNOSIS — I10 Essential (primary) hypertension: Secondary | ICD-10-CM | POA: Diagnosis not present

## 2023-11-18 DIAGNOSIS — R0789 Other chest pain: Secondary | ICD-10-CM | POA: Insufficient documentation

## 2023-11-18 DIAGNOSIS — Z79899 Other long term (current) drug therapy: Secondary | ICD-10-CM | POA: Insufficient documentation

## 2023-11-18 DIAGNOSIS — R079 Chest pain, unspecified: Secondary | ICD-10-CM | POA: Diagnosis not present

## 2023-11-18 DIAGNOSIS — R0602 Shortness of breath: Secondary | ICD-10-CM | POA: Diagnosis not present

## 2023-11-18 LAB — CBC WITH DIFFERENTIAL/PLATELET
Abs Immature Granulocytes: 0.04 10*3/uL (ref 0.00–0.07)
Basophils Absolute: 0 10*3/uL (ref 0.0–0.1)
Basophils Relative: 0 %
Eosinophils Absolute: 0 10*3/uL (ref 0.0–0.5)
Eosinophils Relative: 0 %
HCT: 33.3 % — ABNORMAL LOW (ref 36.0–46.0)
Hemoglobin: 11.4 g/dL — ABNORMAL LOW (ref 12.0–15.0)
Immature Granulocytes: 0 %
Lymphocytes Relative: 45 %
Lymphs Abs: 4.5 10*3/uL — ABNORMAL HIGH (ref 0.7–4.0)
MCH: 33.4 pg (ref 26.0–34.0)
MCHC: 34.2 g/dL (ref 30.0–36.0)
MCV: 97.7 fL (ref 80.0–100.0)
Monocytes Absolute: 0.6 10*3/uL (ref 0.1–1.0)
Monocytes Relative: 6 %
Neutro Abs: 5 10*3/uL (ref 1.7–7.7)
Neutrophils Relative %: 49 %
Platelets: 282 10*3/uL (ref 150–400)
RBC: 3.41 MIL/uL — ABNORMAL LOW (ref 3.87–5.11)
RDW: 14 % (ref 11.5–15.5)
WBC: 10.1 10*3/uL (ref 4.0–10.5)
nRBC: 0 % (ref 0.0–0.2)

## 2023-11-18 LAB — COMPREHENSIVE METABOLIC PANEL
ALT: 12 U/L (ref 0–44)
AST: 12 U/L — ABNORMAL LOW (ref 15–41)
Albumin: 3.9 g/dL (ref 3.5–5.0)
Alkaline Phosphatase: 45 U/L (ref 38–126)
Anion gap: 7 (ref 5–15)
BUN: 15 mg/dL (ref 6–20)
CO2: 26 mmol/L (ref 22–32)
Calcium: 8.8 mg/dL — ABNORMAL LOW (ref 8.9–10.3)
Chloride: 105 mmol/L (ref 98–111)
Creatinine, Ser: 0.75 mg/dL (ref 0.44–1.00)
GFR, Estimated: >60 mL/min (ref >60)
Glucose, Bld: 103 mg/dL — ABNORMAL HIGH (ref 70–99)
Potassium: 3.6 mmol/L (ref 3.5–5.1)
Sodium: 138 mmol/L (ref 135–145)
Total Bilirubin: 0.6 mg/dL (ref 0.0–1.2)
Total Protein: 7.5 g/dL (ref 6.5–8.1)

## 2023-11-18 LAB — TROPONIN I (HIGH SENSITIVITY)
Troponin I (High Sensitivity): 4 ng/L (ref <18)
Troponin I (High Sensitivity): 4 ng/L (ref <18)

## 2023-11-18 MED ORDER — KETOROLAC TROMETHAMINE 30 MG/ML IJ SOLN
30.0000 mg | Freq: Once | INTRAMUSCULAR | Status: AC
  Administered 2023-11-18: 30 mg via INTRAVENOUS
  Filled 2023-11-18: qty 1

## 2023-11-18 MED ORDER — HYDROCODONE-ACETAMINOPHEN 5-325 MG PO TABS
1.0000 | ORAL_TABLET | Freq: Once | ORAL | Status: AC
  Administered 2023-11-18: 1 via ORAL
  Filled 2023-11-18: qty 1

## 2023-11-18 NOTE — ED Notes (Signed)
 This tech called Lab for them to get blood work on pt due to this tech being unsuccessful with blood draw.

## 2023-11-18 NOTE — Discharge Instructions (Signed)
You may alternate Tylenol 1000 mg every 6 hours as needed for pain, fever and Ibuprofen 800 mg every 6-8 hours as needed for pain, fever.  Please take Ibuprofen with food.  Do not take more than 4000 mg of Tylenol (acetaminophen) in a 24 hour period. ° °

## 2023-11-18 NOTE — ED Triage Notes (Signed)
 Patient ambulatory to triage with steady gait, without difficulty or distress noted; pt reports mid CP radiating into left arm accomp by [facility] all day

## 2023-11-18 NOTE — ED Provider Notes (Signed)
 [facility] Provider Note    Event Date/Time   First MD Initiated Contact with Patient 11/18/23 0405     (approximate)   History   Chest Pain   HPI  Anita Gibbs is a 53 y.o. female with history of chronic pain, fibromyalgia, hypertension, prediabetes who presents to the emergency department with central chest pain worse with movement, laughing for the past day.  She has had associated shortness of breath.  No nausea, vomiting.  Did have some dizziness.  No fevers, cough.  No history of PE, DVT, exogenous estrogen use, recent fractures, surgery, trauma, hospitalization, prolonged travel or other immobilization. No lower extremity swelling or pain. No calf tenderness.  Pain reproducible with palpation of her chest wall.  History provided by patient, family.    Past Medical History:  Diagnosis Date   Abdominal pain 06/11/2020   Abnormal drug screen 03/06/2020   Abnormal MRI, cervical spine (06/10/2018) 09/27/2018   FINDINGS:  Vertebrae: fusion hardware at C4, C5, C6, and C7.  Posterior Fossa, vertebral arteries, paraspinal tissues: A relatively empty sella present.     Disc levels:  C3-4: Negative. A mild broad-based disc osteophyte complex present. Uncovertebral spurring contributes to mild foraminal narrowing bilaterally.  C4-5: A leftward disc osteophyte complex is present. Uncovertebral and facet disease   Acute cystitis without hematuria 06/18/2020   Acute vaginitis 06/11/2020   Adenoma of left adrenal gland 08/01/2020   Adrenal nodule (HCC) 06/20/2020   Allergy    ANA positive 02/01/2021   Anxiety and depression 01/29/2021   Arthritis    Back pain with history of spinal surgery 01/29/2021   Bilateral carpal tunnel syndrome 01/29/2021   Bilateral leg edema 01/29/2021   BMI 40.0-44.9, adult (HCC) 04/30/2022   Carpal boss, right    Carpal tunnel syndrome, bilateral    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) 09/27/2018   Levels:  C4-5: There is  partial effacement of ventral CSF.     IMPRESSION:  Mild residual central canal narrowing at C4-5 s/p ACDF.   Cervical facet hypertrophy (C3-T1) 09/27/2018   Levels:  C3-4: Uncovertebral spurring  C4-5: Uncovertebral and facet disease  C5-6: Residual uncovertebral disease.  C6-7: Residual uncovertebral disease.  C7-T1: Minimal left-sided uncovertebral spurring   Cervical facet joint syndrome (Bilateral) (L>R) 09/27/2018   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-5, C5-6, and C6-7) 09/27/2018   Levels:  C3-4: Mild foraminal narrowing bilaterally.  C4-5: Mild left foraminal narrowing.  C5-6: Mild left foraminal narrowing is due to residual uncovertebral disease.  C6-7: Mild left foraminal narrowing is due to residual uncovertebral disease.   Cervical myelopathy (HCC) 07/01/2017   Cervicalgia (Primary Area of Pain) (Bilateral) (L>R) 09/27/2018   Chronic gout of foot (Left) 05/31/2018   Chronic low back pain [facility] Area of Pain) (Bilateral) (R>L) w/ sciatica (Bilateral) 09/07/2018   Chronic low back pain [facility] Area of Pain) (Bilateral) (R>L) w/o sciatica 07/26/2018   Chronic lower extremity pain (Fourth Area of Pain) (Bilateral) (R>L) 09/07/2018   Chronic musculoskeletal pain 10/27/2018   Chronic neck pain (Bilateral) w/ history of cervical spinal surgery 09/27/2018   Chronic neck pain (Primary Area of Pain) (Bilateral) (L>R) 09/07/2018   Chronic sacroiliac joint dysfunction (Bilateral) 09/27/2018   Chronic sacroiliac joint pain (Right) 09/07/2018   Chronic upper extremity pain (Secondary Area of Pain) (Bilateral) (R>L) 09/27/2018   Disorder of skeletal system 09/07/2018   E. coli UTI (urinary tract infection) 06/20/2020   Elevated sed rate 09/08/2018   Elevated  serum creatinine 06/20/2020   Excessive daytime sleepiness 03/06/2020   Female pelvic pain 09/05/2020   GERD (gastroesophageal reflux disease)    Headache    History of allergy to shellfish 09/28/2018   History of  fusion of cervical spine (ACDF C4-C7) 09/27/2018   History of illicit drug use 03/07/2020   Hives 09/05/2020   Hives 09/05/2020   Hyperkalemia 05/31/2018   Mild   Hyperlipidemia 04/30/2022   Hypertension    Large breasts 05/30/2020   Left hip pain 07/21/2022   Lumbar facet arthropathy 04/20/2019   Lumbar radiculitis (L5 dermatome) (Right) 04/17/2020   Multiple environmental allergies 01/17/2020   Multiple food allergies 03/07/2020   Neurogenic pain 10/27/2018   Numbness and tingling of both feet 05/31/2018   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   Other intervertebral disc degeneration, lumbar region 11/15/2019   Pharmacologic therapy 09/07/2018   Plantar fasciitis, bilateral 03/04/2021   Polyneuropathy 01/29/2021   Prediabetes 10/10/2019   Problems influencing health status 09/07/2018   Rash 08/08/2019   Seasonal allergic rhinitis due to pollen 11/29/2018   Seasonal allergies 03/06/2020   Seasonal asthma 11/29/2018   Sleep apnea    Somatic dysfunction of sacroiliac joint (Bilateral) 09/27/2018   Spondylosis without myelopathy or radiculopathy, cervical region 03/22/2019   Spondylosis without myelopathy or radiculopathy, lumbosacral region 05/19/2019   Spondylosis, cervical, w/ myelopathy 09/27/2018   Vaginal discharge 06/11/2020    Past Surgical History:  Procedure Laterality Date   ABDOMINAL HYSTERECTOMY  10/17/2020   [facility]; she has both ovaries   ANTERIOR CERVICAL DECOMP/DISCECTOMY FUSION N/A 07/01/2017   Procedure: ANTERIOR CERVICAL DECOMPRESSION/DISCECTOMY FUSION 3 LEVELS;  Surgeon: Venetia Night, MD;  Location: [facility] ORS;  Service: Neurosurgery;  Laterality: N/A;   CARPAL TUNNEL RELEASE Bilateral    KNEE ARTHROSCOPY Right     MEDICATIONS:  Prior to Admission medications   Medication Sig Start Date End Date Taking? Authorizing Provider  albuterol (VENTOLIN HFA) 108 (90 Base) MCG/ACT inhaler Inhale 2 puffs into the lungs every 6 (six) hours  as needed for wheezing or shortness of breath. 10/23/23   [name], NP  amLODipine (NORVASC) 5 MG tablet Take 1 tablet (5 mg total) by mouth daily. 11/09/23   [name], NP  atorvastatin (LIPITOR) 10 MG tablet MWF at night 04/08/23   Dana Allan, MD  busPIRone (BUSPAR) 10 MG tablet Take 1 tablet (10 mg total) by mouth 2 (two) times daily. 10/23/23   [name], NP  cetirizine (ZYRTEC ALLERGY) 10 MG tablet Take 1 tablet (10 mg total) by mouth at bedtime as needed. 04/08/23   Dana Allan, MD  Cholecalciferol (VITAMIN D-3) 125 MCG (5000 UT) TABS Take by mouth daily.    [provider]  Cyanocobalamin (B-12 PO) Take by mouth.    [provider]  dicyclomine (BENTYL) 20 MG tablet Take 1 tablet (20 mg total) by mouth 4 (four) times daily -  before meals and at bedtime. 07/22/23   [name], NP  DULoxetine (CYMBALTA) 60 MG capsule Take 1 capsule (60 mg total) by mouth 2 (two) times daily. 10/23/23   [name], NP  EPINEPHrine (AUVI-Q) 0.3 mg/0.3 mL IJ SOAJ injection Use as directed for severe allergic reaction 08/19/18   Kozlow, Alvira Philips, MD  fluconazole (DIFLUCAN) 150 MG tablet Take 1 tablet by mouth x 1 dose. May repeat in 72 hours if needed. 08/31/23   [name], NP  fluticasone (FLONASE) 50 MCG/ACT nasal spray Place 2 sprays into both nostrils daily. 04/08/23  Dana Allan, MD  hydrocortisone 2.5 % cream Apply topically 2 (two) times daily. Prn to eyelids 04/08/23   Dana Allan, MD  hydrOXYzine (VISTARIL) 25 MG capsule Take 1 capsule (25 mg total) by mouth 3 (three) times daily. 11/09/23   [name], NP  losartan (COZAAR) 100 MG tablet Take 1 tablet (100 mg total) by mouth daily. 04/08/23   Dana Allan, MD  meloxicam (MOBIC) 15 MG tablet Take 1 tablet (15 mg total) by mouth every other day as needed. 12/24/21   Edward Jolly, MD  methocarbamol (ROBAXIN) 750 MG tablet Take 1 tablet (750 mg total) by mouth every 8 (eight) hours as needed for muscle spasms. 03/19/23 03/13/24   Edward Jolly, MD  montelukast (SINGULAIR) 10 MG tablet Take 1 tablet (10 mg total) by mouth at bedtime. 04/08/23   Dana Allan, MD  pantoprazole (PROTONIX) 40 MG tablet TAKE ONE TABLET EVERY DAY 30 MIN BEFORE DINNER 04/08/23   Dana Allan, MD  polyethylene glycol powder (GLYCOLAX/MIRALAX) 17 GM/SCOOP powder Take 17 g by mouth 2 (two) times daily as needed. 08/05/22   Dana Allan, MD  potassium chloride SA (KLOR-CON M) 20 MEQ tablet Take 1 tablet (20 mEq total) by mouth daily as needed. With lasix 04/08/23   Dana Allan, MD  pregabalin (LYRICA) 75 MG capsule Take 1 capsule (75 mg total) by mouth 3 (three) times daily. 08/04/23   Edward Jolly, MD  saccharomyces boulardii (FLORASTOR) 250 MG capsule Take 1 capsule (250 mg total) by mouth daily. 04/08/23   Dana Allan, MD    Physical Exam   Triage Vital Signs: ED Triage Vitals  Encounter Vitals Group     BP 11/18/23 0013 (!) 167/100     Systolic BP Percentile --      Diastolic BP Percentile --      Pulse Rate 11/18/23 0013 67     Resp 11/18/23 0013 20     Temp 11/18/23 0013 97.7 F (36.5 C)     Temp Source 11/18/23 0013 Oral     SpO2 11/18/23 0013 100 %     Weight 11/18/23 0010 238 lb (108 kg)     Height 11/18/23 0010 5\' 3"  (1.6 m)     Head Circumference --      Peak Flow --      Pain Score 11/18/23 0010 7     Pain Loc --      Pain Education --      Exclude from Growth Chart --     Most recent vital signs: Vitals:   11/18/23 0013 11/18/23 0457  BP: (!) 167/100 (!) 154/77  Pulse: 67 (!) 58  Resp: 20 20  Temp: 97.7 F (36.5 C) 97.8 F (36.6 C)  SpO2: 100% 97%    CONSTITUTIONAL: Alert, responds appropriately to questions. Well-appearing; well-nourished HEAD: Normocephalic, atraumatic EYES: Conjunctivae clear, pupils appear equal, sclera nonicteric ENT: normal nose; moist mucous membranes NECK: Supple, normal ROM CARD: RRR; S1 and S2 appreciated CHEST:  Chest wall is tender to palpation.  No crepitus, ecchymosis, erythema,  warmth, rash or other lesions present.   RESP: Normal chest excursion without splinting or tachypnea; breath sounds clear and equal bilaterally; no wheezes, no rhonchi, no rales, no hypoxia or respiratory distress, speaking full sentences ABD/GI: Non-distended; soft, non-tender, no rebound, no guarding, no peritoneal signs BACK: The back appears normal EXT: Normal ROM in all joints; no deformity noted, no edema, no calf tenderness or calf swelling SKIN: Normal color for age and race; warm;  no rash on exposed skin NEURO: Moves all extremities equally, normal speech PSYCH: The patient's mood and manner are appropriate.   ED Results / Procedures / Treatments   LABS: (all labs ordered are listed, but only abnormal results are displayed) Labs Reviewed  CBC WITH DIFFERENTIAL/PLATELET - Abnormal; Notable for the following components:      Result Value   RBC 3.41 (*)    Hemoglobin 11.4 (*)    HCT 33.3 (*)    Lymphs Abs 4.5 (*)    All other components within normal limits  COMPREHENSIVE METABOLIC PANEL - Abnormal; Notable for the following components:   Glucose, Bld 103 (*)    Calcium 8.8 (*)    AST 12 (*)    All other components within normal limits  TROPONIN I (HIGH SENSITIVITY)  TROPONIN I (HIGH SENSITIVITY)     EKG:  EKG Interpretation Date/Time:  Wednesday November 18 2023 00:17:18 EDT Ventricular Rate:  60 PR Interval:  146 QRS Duration:  78 QT Interval:  420 QTC Calculation: 420 R Axis:   94  Text Interpretation: Normal sinus rhythm Rightward axis Borderline ECG When compared with ECG of 09-Aug-2007 20:38, No significant change was found Confirmed by Rochele Raring [phone]) on 11/18/2023 4:39:17 AM         RADIOLOGY: My personal review and interpretation of imaging: Chest x-ray clear.  I have personally reviewed all radiology reports.   DG Chest 2 View Result Date: 11/18/2023 CLINICAL DATA:  Shortness of breath, mid chest pain EXAM: CHEST - 2 VIEW COMPARISON:  08/09/2007  FINDINGS: The heart size and mediastinal contours are within normal limits. Both lungs are clear. The visualized skeletal structures are unremarkable. IMPRESSION: Normal study. Electronically Signed   By: Charlett Nose M.D.   On: 11/18/2023 01:04     PROCEDURES:  Critical Care performed: No      .1-3 Lead EKG Interpretation  Performed by: Jadie Comas, Layla Maw, DO Authorized by: Teffany Blaszczyk, Layla Maw, DO     Interpretation: normal     ECG rate:  67   ECG rate assessment: normal     Rhythm: sinus rhythm     Ectopy: none     Conduction: normal       IMPRESSION / MDM / ASSESSMENT AND PLAN / ED COURSE  I reviewed the triage vital signs and the nursing notes.    Patient here with atypical chest pain.  The patient is on the cardiac monitor to evaluate for evidence of arrhythmia and/or significant heart rate changes.   DIFFERENTIAL DIAGNOSIS (includes but not limited to):   Chest wall pain, less likely ACS, PE, dissection, pneumonia, CHF, pneumothorax   Patient's presentation is most consistent with acute presentation with potential threat to life or bodily function.   PLAN: EKG is nonischemic.  First troponin negative.  Repeat pending.  Normal hemoglobin, electrolytes.  No risk factors for PE other than age.  Suspect chest wall pain.  Pain worse with laughing, movement, palpation.  Will give Toradol and reassess.  Chest x-ray reviewed and interpreted by myself and radiologist and is clear.   MEDICATIONS GIVEN IN ED: Medications  ketorolac (TORADOL) 30 MG/ML injection 30 mg (30 mg Intravenous Given 11/18/23 0439)  HYDROcodone-acetaminophen (NORCO/VICODIN) 5-325 MG per tablet 1 tablet (1 tablet Oral Given 11/18/23 0524)     ED COURSE: Second troponin negative.  Patient feeling better after Toradol, Vicodin.  Recommended over-the-counter Tylenol, Motrin for pain control.   At this time, I do not feel there is  any life-threatening condition present. I reviewed all nursing notes, vitals,  pertinent previous records.  All lab and urine results, EKGs, imaging ordered have been independently reviewed and interpreted by myself.  I reviewed all available radiology reports from any imaging ordered this visit.  Based on my assessment, I feel the patient is safe to be discharged home without further emergent workup and can continue workup as an outpatient as needed. Discussed all findings, treatment plan as well as usual and customary return precautions.  They verbalize understanding and are comfortable with this plan.  Outpatient follow-up has been provided as needed.  All questions have been answered.    CONSULTS:  none   OUTSIDE RECORDS REVIEWED: Reviewed last PCP note on 11/09/2023.       FINAL CLINICAL IMPRESSION(S) / ED DIAGNOSES   Final diagnoses:  Atypical chest pain     Rx / DC Orders   ED Discharge Orders     None        Note:  This document was prepared using Dragon voice recognition software and may include unintentional dictation errors.   Erdem Naas, Layla Maw, DO 11/18/23 [phone]

## 2023-11-19 ENCOUNTER — Ambulatory Visit (INDEPENDENT_AMBULATORY_CARE_PROVIDER_SITE_OTHER): Payer: 59 | Admitting: Clinical

## 2023-11-19 ENCOUNTER — Telehealth: Payer: Self-pay

## 2023-11-19 DIAGNOSIS — F32A Depression, unspecified: Secondary | ICD-10-CM

## 2023-11-19 DIAGNOSIS — F411 Generalized anxiety disorder: Secondary | ICD-10-CM

## 2023-11-19 DIAGNOSIS — F410 Panic disorder [episodic paroxysmal anxiety] without agoraphobia: Secondary | ICD-10-CM

## 2023-11-19 NOTE — Progress Notes (Signed)
   Anita Barthel, LCSW

## 2023-11-19 NOTE — Transitions of Care (Post Inpatient/ED Visit) (Signed)
   11/19/2023  Name: Anita [facility] MRN: [id number] DOB: 06-Jul-1971  Today's TOC FU Call Status: Today's TOC FU Call Status:: Unsuccessful Call (1st Attempt) Unsuccessful Call (1st Attempt) Date: 11/19/23  Attempted to reach the patient regarding the most recent Inpatient/ED visit.  Follow Up Plan: Additional outreach attempts will be made to reach the patient to complete the Transitions of Care (Post Inpatient/ED visit) call.   Signature Karena Addison, LPN [facility] Nurse Health Advisor Direct Dial [phone]
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
## 2023-11-20 DIAGNOSIS — G8929 Other chronic pain: Secondary | ICD-10-CM | POA: Diagnosis not present

## 2023-11-20 DIAGNOSIS — M5442 Lumbago with sciatica, left side: Secondary | ICD-10-CM | POA: Diagnosis not present

## 2023-11-23 ENCOUNTER — Ambulatory Visit (INDEPENDENT_AMBULATORY_CARE_PROVIDER_SITE_OTHER): Admitting: Clinical

## 2023-11-23 DIAGNOSIS — F411 Generalized anxiety disorder: Secondary | ICD-10-CM

## 2023-11-23 DIAGNOSIS — F32A Depression, unspecified: Secondary | ICD-10-CM

## 2023-11-23 DIAGNOSIS — F410 Panic disorder [episodic paroxysmal anxiety] without agoraphobia: Secondary | ICD-10-CM | POA: Diagnosis not present

## 2023-11-23 NOTE — Transitions of Care (Post Inpatient/ED Visit) (Unsigned)
   11/23/2023  Name: Anita Gibbs MRN: [id number] DOB: 1971/01/09  Today's TOC FU Call Status: Today's TOC FU Call Status:: Unsuccessful Call (2nd Attempt) Unsuccessful Call (1st Attempt) Date: 11/19/23 Unsuccessful Call (2nd Attempt) Date: 11/23/23  Attempted to reach the patient regarding the most recent Inpatient/ED visit.  Follow Up Plan: Additional outreach attempts will be made to reach the patient to complete the Transitions of Care (Post Inpatient/ED visit) call.   Signature Karena Addison, LPN [facility] Nurse Health Advisor Direct Dial [phone]

## 2023-11-23 NOTE — Progress Notes (Signed)
 [facility] Counselor/Therapist Progress Note  Patient ID: Anita Gibbs, MRN: [id number]    Date: 11/23/23  Time Spent: 10:32  am - 11:26 am : 54 Minutes  Treatment Type: Individual Therapy.  Reported Symptoms: sadness and irritability daily, fatigue, chest pain  Mental Status Exam: Appearance:  Neat and Well Groomed     Behavior: Appropriate  Motor: Normal  Speech/Language:  Clear and Coherent and Normal Rate  Affect: Depressed  Mood: irritable and sad  Thought process: normal  Thought content:   WNL  Sensory/Perceptual disturbances:   Patient reported experiencing pain in patient's chest during session  Orientation: oriented to person, place, and situation  Attention: Good  Concentration: Good  Memory: WNL  Fund of knowledge:  Good  Insight:   Good  Judgment:  Good  Impulse Control: Good   Risk Assessment: Danger to Self:  No Patient denied current suicidal ideation  Self-injurious Behavior: No Danger to Others: No Patient denied current homicidal ideation Duty to Warn:no Physical Aggression / Violence:No  Access to Firearms a concern: No  Gang Involvement:No   Subjective:  Patient stated, "not well, I'm having chest pains, I'm not feeling well". Patient stated, "not good", "sad, irritated" in response to patient's mood. Patient stated, "this is the first time I haven't been able to work".  Patient reported she was scheduled for an EMG on patient's leg and is waiting for follow up with patient's physicians. Patient reported she was recently approved to begin injections for treatment of pain from osteoarthritis. Patient reported experiencing fatigue daily and reported feeling "gloomy" daily. Patient stated, "I cry every day about something". Patient stated, "I'm tired of this pain, I want to go and do and I can't".  Patient reported no questions in response to diagnoses. Patient reported she is open to a consultation with a psychiatrist. Patient stated, "yea  I probably need it" in response to participation in a grief/loss support group. Patient stated, "ill try it" in response to therapy. Patient stated, "I'm just trying to get better so I can get everything in order". Patient reported she would like to return to work and reported financial stressors. Patient stated, "try to get back to my daily activities".   Interventions: Motivational Interviewing. Clinician conducted session via caregility video from clinician's home office. Patient provided verbal consent to proceed with telehealth session and is aware of limitations of telephone or video visits. Patient participated in session from patient's home. Reviewed events since last session and assessed for changes. Provided psycho education related to deep breathing exercise. Clinician reviewed diagnoses and treatment recommendations. Provided psycho education related to diagnoses and treatment. Discussed additional symptoms. Clinician utilized motivational interviewing to explore potential goals for therapy. Clinician provided resource information for local food pantries and utility assistance programs. Clinician requested for homework patient consider potential goals for therapy.   Diagnosis:  Panic disorder  Generalized anxiety disorder  Depression, unspecified depression type   Plan: Goals to be developed during patient's follow up appointment on 12/04/23.                  Doree Barthel, LCSW

## 2023-11-24 ENCOUNTER — Ambulatory Visit: Attending: Nurse Practitioner

## 2023-11-24 ENCOUNTER — Ambulatory Visit (INDEPENDENT_AMBULATORY_CARE_PROVIDER_SITE_OTHER): Payer: 59 | Admitting: Nurse Practitioner

## 2023-11-24 ENCOUNTER — Encounter: Payer: Self-pay | Admitting: Nurse Practitioner

## 2023-11-24 VITALS — BP 138/80 | HR 68 | Temp 98.3°F | Ht 63.0 in | Wt 244.8 lb

## 2023-11-24 DIAGNOSIS — R0789 Other chest pain: Secondary | ICD-10-CM

## 2023-11-24 DIAGNOSIS — T63461A Toxic effect of venom of wasps, accidental (unintentional), initial encounter: Secondary | ICD-10-CM | POA: Diagnosis not present

## 2023-11-24 DIAGNOSIS — R002 Palpitations: Secondary | ICD-10-CM | POA: Diagnosis not present

## 2023-11-24 DIAGNOSIS — F411 Generalized anxiety disorder: Secondary | ICD-10-CM | POA: Diagnosis not present

## 2023-11-24 DIAGNOSIS — F410 Panic disorder [episodic paroxysmal anxiety] without agoraphobia: Secondary | ICD-10-CM | POA: Diagnosis not present

## 2023-11-24 MED ORDER — TRIAMCINOLONE ACETONIDE 0.1 % EX CREA: 1.0000 | TOPICAL_CREAM | Freq: Two times a day (BID) | CUTANEOUS | 0 refills | Status: AC

## 2023-11-24 NOTE — Progress Notes (Signed)
 Anita Dicker, NP-C Phone: [phone]  Anita Gibbs is a 53 y.o. female who presents today for follow up.   Discussed the use of AI scribe software for clinical note transcription with the patient, who gave verbal consent to proceed.  History of Present Illness   Anita Gibbs is a 53 year old female who presents for a two-week follow-up on mood and evaluation of chest pain.  She has been experiencing chest pain and tightness, which led to a visit to the emergency room where a myocardial infarction was ruled out. The chest pain occurs with tachycardia, particularly during exertion, such as walking up her driveway, and even while at rest, such as sitting and watching a movie. She experiences palpitations and a sensation of her heart being 'out of rhythm.' Her blood pressure was elevated at 167/100 during the ER visit, which she attributes to pain and anxiety at the time. There is no radiating pain to the jaw or arm. She had a negative chest x-ray, normal EKG and troponins whiles in the ER. Her pain was believed to be musculoskeletal at the time as it was reproducible with palpation and worse with laughter. She was treated with Toradol.    She is here for a two-week follow-up on her mood. She has been attending counseling sessions and has had two sessions so far, which she feels are going well. Her hydroxyzine dosage was increased two weeks ago to be taken more regularly, which she reports has been beneficial. She is also taking Cymbalta and Buspar regularly.  She experienced a wasp sting on Saturday night while watching a movie. The sting occurred on her hand, resulting in swelling and pruritus at the site. She has applied ice, alcohol, peroxide, and hydrocortisone cream to the area. She has also taken Benadryl and Zyrtec to manage the itching, although Benadryl was not effective. She is concerned about the persistent swelling and itching.      Social History   Tobacco Use  Smoking Status  Never  Smokeless Tobacco Never    Current Outpatient Medications on File Prior to Visit  Medication Sig Dispense Refill   albuterol (VENTOLIN HFA) 108 (90 Base) MCG/ACT inhaler Inhale 2 puffs into the lungs every 6 (six) hours as needed for wheezing or shortness of breath. 8 g 11   amLODipine (NORVASC) 5 MG tablet Take 1 tablet (5 mg total) by mouth daily. 90 tablet 0   atorvastatin (LIPITOR) 10 MG tablet MWF at night 90 tablet 3   busPIRone (BUSPAR) 10 MG tablet Take 1 tablet (10 mg total) by mouth 2 (two) times daily. 60 tablet 1   cetirizine (ZYRTEC ALLERGY) 10 MG tablet Take 1 tablet (10 mg total) by mouth at bedtime as needed. 90 tablet 3   Cholecalciferol (VITAMIN D-3) 125 MCG (5000 UT) TABS Take by mouth daily.     Cyanocobalamin (B-12 PO) Take by mouth.     dicyclomine (BENTYL) 20 MG tablet Take 1 tablet (20 mg total) by mouth 4 (four) times daily -  before meals and at bedtime. 120 tablet 1   DULoxetine (CYMBALTA) 60 MG capsule Take 1 capsule (60 mg total) by mouth 2 (two) times daily. 60 capsule 2   EPINEPHrine (AUVI-Q) 0.3 mg/0.3 mL IJ SOAJ injection Use as directed for severe allergic reaction 2 Device 2   fluconazole (DIFLUCAN) 150 MG tablet Take 1 tablet by mouth x 1 dose. May repeat in 72 hours if needed. 2 tablet 0   fluticasone (FLONASE) 50  MCG/ACT nasal spray Place 2 sprays into both nostrils daily. 16 g 6   hydrocortisone 2.5 % cream Apply topically 2 (two) times daily. Prn to eyelids 30 g 0   hydrOXYzine (VISTARIL) 25 MG capsule Take 1 capsule (25 mg total) by mouth 3 (three) times daily. 90 capsule 2   losartan (COZAAR) 100 MG tablet Take 1 tablet (100 mg total) by mouth daily. 90 tablet 3   meloxicam (MOBIC) 15 MG tablet Take 1 tablet (15 mg total) by mouth every other day as needed. 45 tablet 11   methocarbamol (ROBAXIN) 750 MG tablet Take 1 tablet (750 mg total) by mouth every 8 (eight) hours as needed for muscle spasms. 90 tablet 11   montelukast (SINGULAIR) 10 MG  tablet Take 1 tablet (10 mg total) by mouth at bedtime. 90 tablet 3   pantoprazole (PROTONIX) 40 MG tablet TAKE ONE TABLET EVERY DAY 30 MIN BEFORE DINNER 90 tablet 3   polyethylene glycol powder (GLYCOLAX/MIRALAX) 17 GM/SCOOP powder Take 17 g by mouth 2 (two) times daily as needed. 3350 g 1   potassium chloride SA (KLOR-CON M) 20 MEQ tablet Take 1 tablet (20 mEq total) by mouth daily as needed. With lasix 30 tablet 11   pregabalin (LYRICA) 75 MG capsule Take 1 capsule (75 mg total) by mouth 3 (three) times daily. 90 capsule 11   saccharomyces boulardii (FLORASTOR) 250 MG capsule Take 1 capsule (250 mg total) by mouth daily. 90 capsule 0   No current facility-administered medications on file prior to visit.    ROS see history of present illness  Objective  Physical Exam Vitals:   11/24/23 0804  BP: 138/80  Pulse: 68  Temp: 98.3 F (36.8 C)  SpO2: 98%    BP Readings from Last 3 Encounters:  11/24/23 138/80  11/18/23 (!) 154/77  11/09/23 128/84   Wt Readings from Last 3 Encounters:  11/24/23 244 lb 12.8 oz (111 kg)  11/18/23 238 lb (108 kg)  11/09/23 241 lb 6.4 oz (109.5 kg)    Physical Exam Constitutional:      General: She is not in acute distress.    Appearance: Normal appearance.  HENT:     Head: Normocephalic.  Cardiovascular:     Rate and Rhythm: Normal rate and regular rhythm.     Heart sounds: Normal heart sounds.  Pulmonary:     Effort: Pulmonary effort is normal.     Breath sounds: Normal breath sounds.  Skin:    General: Skin is warm and dry.     Comments: Wasp sting on right forearm noted, with swelling present. See picture below.   Neurological:     General: No focal deficit present.     Mental Status: She is alert.  Psychiatric:        Mood and Affect: Mood normal.        Behavior: Behavior normal.      Assessment/Plan: Please see individual problem list.  Palpitations Assessment & Plan: Chest pain and palpitations are linked to anxiety, with  negative MI work up in the ER. Symptoms, including tachycardia and chest tightness, occur at rest and during exertion. A heart monitor will assess rhythm and rate. Emergency care is advised for severe symptoms. Order a two-week heart monitor. Consider cardiology referral if abnormalities are found.   Orders: -     LONG TERM MONITOR (3-14 DAYS); Future  Other chest pain Assessment & Plan: Recurrent, localized, non-radiating chest pain has increased in frequency, though previous workup  was negative. Consider its relationship to panic attacks. Recent evaluation in the ER was likely musculoskeletal pain. See plan for palpitations.   Orders: -     LONG TERM MONITOR (3-14 DAYS); Future  Generalized anxiety disorder with panic attacks Assessment & Plan: Mild improvement since starting counseling and consistent use of hydroxyzine. Continue Cymbalta, Buspar, and hydroxyzine. Follow up with counselor as scheduled.    Wasp sting, accidental or unintentional, initial encounter Assessment & Plan: A wasp sting on her hand is causing swelling and pruritus. Topical treatment is preferred to avoid systemic steroid side effects that could exacerbate anxiety/heart palpitations. Prescribe a topical steroid cream and advise daily Zyrtec for its antihistamine effect. Monitor for infection signs. Use ice as needed.   Orders: -     Triamcinolone Acetonide; Apply 1 Application topically 2 (two) times daily.  Dispense: 30 g; Refill: 0    Return in about 6 weeks (around 01/05/2024) for Follow up.   Anita Dicker, NP-C [facility]

## 2023-11-24 NOTE — Assessment & Plan Note (Addendum)
 A wasp sting on her hand is causing swelling and pruritus. Topical treatment is preferred to avoid systemic steroid side effects that could exacerbate anxiety/heart palpitations. Prescribe a topical steroid cream and advise daily Zyrtec for its antihistamine effect. Monitor for infection signs. Use ice as needed.

## 2023-11-24 NOTE — Transitions of Care (Post Inpatient/ED Visit) (Signed)
   11/24/2023  Name: Anita Gibbs MRN: [id number] DOB: 08-07-1971  Today's TOC FU Call Status: Today's TOC FU Call Status:: Unsuccessful Call (2nd Attempt) Unsuccessful Call (1st Attempt) Date: 11/19/23 Unsuccessful Call (2nd Attempt) Date: 11/23/23  Attempted to reach the patient regarding the most recent Inpatient/ED visit.  Follow Up Plan: No further outreach attempts will be made at this time. We have been unable to contact the patient. Patient already seen in office Signature Karena Addison, LPN [facility] Nurse Health Advisor Direct Dial [phone]

## 2023-11-24 NOTE — Assessment & Plan Note (Signed)
 Mild improvement since starting counseling and consistent use of hydroxyzine. Continue Cymbalta, Buspar, and hydroxyzine. Follow up with counselor as scheduled.

## 2023-11-24 NOTE — Assessment & Plan Note (Signed)
 Recurrent, localized, non-radiating chest pain has increased in frequency, though previous workup was negative. Consider its relationship to panic attacks. Recent evaluation in the ER was likely musculoskeletal pain. See plan for palpitations.

## 2023-11-24 NOTE — Assessment & Plan Note (Signed)
 Chest pain and palpitations are linked to anxiety, with negative MI work up in the ER. Symptoms, including tachycardia and chest tightness, occur at rest and during exertion. A heart monitor will assess rhythm and rate. Emergency care is advised for severe symptoms. Order a two-week heart monitor. Consider cardiology referral if abnormalities are found.

## 2023-11-25 ENCOUNTER — Ambulatory Visit: Admitting: Nurse Practitioner

## 2023-11-27 ENCOUNTER — Ambulatory Visit (INDEPENDENT_AMBULATORY_CARE_PROVIDER_SITE_OTHER): Admitting: Clinical

## 2023-11-27 DIAGNOSIS — F411 Generalized anxiety disorder: Secondary | ICD-10-CM | POA: Diagnosis not present

## 2023-11-27 DIAGNOSIS — F32A Depression, unspecified: Secondary | ICD-10-CM

## 2023-11-27 DIAGNOSIS — F410 Panic disorder [episodic paroxysmal anxiety] without agoraphobia: Secondary | ICD-10-CM | POA: Diagnosis not present

## 2023-11-27 NOTE — Progress Notes (Signed)
 [facility] Counselor/Therapist Progress Note  Patient ID: Anita Gibbs, MRN: [id number]    Date: 11/27/23  Time Spent: 10:35  am - 11:18 am : 43 Minutes  Treatment Type: Individual Therapy.  Reported Symptoms: crying, anxiety, chest pain, passing out  Mental Status Exam: Appearance:  Neat and Well Groomed     Behavior: Evasive  Motor: Tremors, rocking back and forth  Speech/Language:  Clear and Coherent and Normal Rate  Affect: Tearful  Mood: angry, anxious, and sad  Thought process: normal  Thought content:   WNL  Sensory/Perceptual disturbances:   Significant other reported patient passed out during session and regained consciousness   Orientation: oriented to person, place, and situation  Attention: Fair  Concentration: Fair  Memory: WNL  Fund of knowledge:  Good  Insight:   Fair  Judgment:  Good  Impulse Control: Good   Risk Assessment: Danger to Self:  No Patient denied current suicidal ideation  Self-injurious Behavior: No Danger to Others: No Patient denied current homicidal ideation Duty to Warn:no Physical Aggression / Violence:No  Access to Firearms a concern: No  Gang Involvement:No   Subjective:  Patient stated, "they're trying to take my daddy's house". Patient reported the bank is in the process of foreclosure on patient's father's home. Patient reported she reached out to several organizations for assistance and was advised the organizations could not assist. Patient stated, "he (father) wanted me to have the house and they're trying to take my daddy's house". Patient stated, "I can't do this". Patient reported chest pain and reported experiencing chest pain prior to today's session. Patient stated, "I'm anxious, I'm mad, I'm frustrated". Patient reported feelings of frustration regarding patient's interactions with mortgage company and their response. Patient reported she has reached out to [employer] multiple times and provided the  required documentation three times. Patient stated,  "I'm numb" and stated, "I'll be ok", "I'm ok". Patient reported the garage is a relaxing space for patient. During today's session, patient practiced deep breathing exercise and mindfulness/imagery exercise. Patient reported patient's anxiety was a 10 prior to deep breathing exercise and imagery exercise. Patient reported patient's anxiety was a 5 after practicing deep breathing and imagery.   Interventions: Cognitive Behavioral Therapy. Clinician conducted session via caregility video from clinician's home office. Patient provided verbal consent to proceed with telehealth session and is aware of limitations of telephone or video visits. Patient participated in session from patient's home. Patient's significant other, [name], was present during today's session and patient provided verbal consent for significant other to be present and involved in today's session. Discussed current stressors and patient's response to stressors. Assessed for safety. Clinician offered to call 911 when patient passed out but significant other declined and patient regained consciousness. Guided patient in practicing deep breathing exercises and imagery exercise to reduce symptoms of anxiety during session. Provided psycho education related imagery. Explored coping strategies for patient to utilize, such as, listening to music, spending time in patient's garage. Discussed patient contacting legal aid for assistance and provided legal aid's information. Provided patient and significant other with resources for patient to utilize in the event patient experiences a behavioral health emergency, such as, calling 911 or 988, going to a local emergency room, [facility], or [facility] urgent care.   Collaboration of Care: With patient's verbal consent clinician provided patient's significant other with resources for patient to utilize in the event patient  experiences a behavioral health emergency, such as, calling 911  or 988, going to a local emergency room, [facility], or [facility] urgent care, as well as, contact information for legal aid and resources for rent assistance.   Diagnosis:  Panic disorder  Generalized anxiety disorder  Depression, unspecified depression type   Plan: Goals to be developed during follow up appointment on 12/04/23 due to clinical need to assess for safety and additional clinical needs during today's session.       Doree Barthel, LCSW

## 2023-12-01 DIAGNOSIS — M47819 Spondylosis without myelopathy or radiculopathy, site unspecified: Secondary | ICD-10-CM | POA: Diagnosis not present

## 2023-12-02 ENCOUNTER — Ambulatory Visit: Attending: Nurse Practitioner | Admitting: Physical Therapy

## 2023-12-02 DIAGNOSIS — G894 Chronic pain syndrome: Secondary | ICD-10-CM | POA: Insufficient documentation

## 2023-12-02 DIAGNOSIS — M25562 Pain in left knee: Secondary | ICD-10-CM | POA: Insufficient documentation

## 2023-12-02 DIAGNOSIS — M5459 Other low back pain: Secondary | ICD-10-CM | POA: Diagnosis not present

## 2023-12-02 DIAGNOSIS — R2689 Other abnormalities of gait and mobility: Secondary | ICD-10-CM | POA: Insufficient documentation

## 2023-12-02 DIAGNOSIS — R269 Unspecified abnormalities of gait and mobility: Secondary | ICD-10-CM | POA: Insufficient documentation

## 2023-12-02 DIAGNOSIS — M25561 Pain in right knee: Secondary | ICD-10-CM | POA: Insufficient documentation

## 2023-12-02 DIAGNOSIS — M6281 Muscle weakness (generalized): Secondary | ICD-10-CM | POA: Insufficient documentation

## 2023-12-02 DIAGNOSIS — G8929 Other chronic pain: Secondary | ICD-10-CM | POA: Diagnosis present

## 2023-12-03 ENCOUNTER — Encounter: Payer: Self-pay | Admitting: Nurse Practitioner

## 2023-12-03 ENCOUNTER — Encounter: Payer: Self-pay | Admitting: Orthopedic Surgery

## 2023-12-03 NOTE — Progress Notes (Signed)
 Referring Physician:  [name], NP [address]  [place] [address]  Primary Physician:  [name], NP  History of Present Illness: 12/07/2023 [name] has a history of HTN, asthma, GERD, chronic pain syndrome, hyperlipidemia, obesity.   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 Myer Haff.   Saw Dr. Myer Haff back in 2020 for neck pain- he was not convinced she was fused at C6-C7. He recommended C6-C7 facet injections and she was lost to follow up.   She has constant neck pain radiating down left arm to the hand causing numbness and tingling. She has weakness in left hand as well. No right arm pain. Pain is worse with movement. Pain is better with heating pad and muscle relaxers.   She is dropping things with left hand. No dexterity issues. She has some balance issues that are chronic.   She has butrans patch. She takes cymbalta, motrin, mobic, robaxin, and lyrica.   She does not smoke.   Bowel/Bladder Dysfunction: none  Conservative measures:  Physical therapy:  has not participated in PT for neck Multimodal medical therapy including regular antiinflammatories:  Cymbalta, Methocarbamol, Lyrica, Meloxicam Injections:  10/06/2022, 05/18/2023 L3-4 and L4-5 Radiofrequency Ablation 05/19/2022, 07/07/2022 L3-4, L4-5 Medial Branch Block  Past Surgery:  07/01/2017 C4-C7 ACDF by Dr. Myer Haff  The symptoms are causing a significant impact on the patient's life.   Review of Systems:  A 10 point review of systems is negative, except for the pertinent positives and negatives detailed in the HPI.  Past Medical History: Past Medical History:  Diagnosis Date   Abdominal pain 06/11/2020   Abnormal drug screen 03/06/2020   Abnormal MRI, cervical spine (06/10/2018) 09/27/2018   FINDINGS:  Vertebrae: fusion hardware at C4, C5, C6, and C7.  Posterior Fossa, vertebral arteries, paraspinal tissues: A relatively empty sella present.     Disc levels:  C3-4:  Negative. A mild broad-based disc osteophyte complex present. Uncovertebral spurring contributes to mild foraminal narrowing bilaterally.  C4-5: A leftward disc osteophyte complex is present. Uncovertebral and facet disease   Acute cystitis without hematuria 06/18/2020   Acute vaginitis 06/11/2020   Adenoma of left adrenal gland 08/01/2020   Adrenal nodule (HCC) 06/20/2020   Allergy    ANA positive 02/01/2021   Anxiety and depression 01/29/2021   Arthritis    Back pain with history of spinal surgery 01/29/2021   Bilateral carpal tunnel syndrome 01/29/2021   Bilateral leg edema 01/29/2021   BMI 40.0-44.9, adult (HCC) 04/30/2022   Carpal boss, right    Carpal tunnel syndrome, bilateral    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) 09/27/2018   Levels:  C4-5: There is partial effacement of ventral CSF.     IMPRESSION:  Mild residual central canal narrowing at C4-5 s/p ACDF.   Cervical facet hypertrophy (C3-T1) 09/27/2018   Levels:  C3-4: Uncovertebral spurring  C4-5: Uncovertebral and facet disease  C5-6: Residual uncovertebral disease.  C6-7: Residual uncovertebral disease.  C7-T1: Minimal left-sided uncovertebral spurring   Cervical facet joint syndrome (Bilateral) (L>R) 09/27/2018   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-5, C5-6, and C6-7) 09/27/2018   Levels:  C3-4: Mild foraminal narrowing bilaterally.  C4-5: Mild left foraminal narrowing.  C5-6: Mild left foraminal narrowing is due to residual uncovertebral disease.  C6-7: Mild left foraminal narrowing is due to residual uncovertebral disease.   Cervical myelopathy (HCC) 07/01/2017   Cervicalgia (Primary Area of Pain) (Bilateral) (L>R) 09/27/2018   Chronic gout of foot (Left)  05/31/2018   Chronic low back pain [facility] Area of Pain) (Bilateral) (R>L) w/ sciatica (Bilateral) 09/07/2018   Chronic low back pain [facility] Area of Pain) (Bilateral) (R>L) w/o sciatica 07/26/2018   Chronic lower extremity pain (Fourth Area of  Pain) (Bilateral) (R>L) 09/07/2018   Chronic musculoskeletal pain 10/27/2018   Chronic neck pain (Bilateral) w/ history of cervical spinal surgery 09/27/2018   Chronic neck pain (Primary Area of Pain) (Bilateral) (L>R) 09/07/2018   Chronic sacroiliac joint dysfunction (Bilateral) 09/27/2018   Chronic sacroiliac joint pain (Right) 09/07/2018   Chronic upper extremity pain (Secondary Area of Pain) (Bilateral) (R>L) 09/27/2018   Disorder of skeletal system 09/07/2018   E. coli UTI (urinary tract infection) 06/20/2020   Elevated sed rate 09/08/2018   Elevated serum creatinine 06/20/2020   Excessive daytime sleepiness 03/06/2020   Female pelvic pain 09/05/2020   GERD (gastroesophageal reflux disease)    Headache    History of allergy to shellfish 09/28/2018   History of fusion of cervical spine (ACDF C4-C7) 09/27/2018   History of illicit drug use 03/07/2020   Hives 09/05/2020   Hives 09/05/2020   Hyperkalemia 05/31/2018   Mild   Hyperlipidemia 04/30/2022   Hypertension    Large breasts 05/30/2020   Left hip pain 07/21/2022   Lumbar facet arthropathy 04/20/2019   Lumbar radiculitis (L5 dermatome) (Right) 04/17/2020   Multiple environmental allergies 01/17/2020   Multiple food allergies 03/07/2020   Neurogenic pain 10/27/2018   Numbness and tingling of both feet 05/31/2018   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   Other intervertebral disc degeneration, lumbar region 11/15/2019   Pharmacologic therapy 09/07/2018   Plantar fasciitis, bilateral 03/04/2021   Polyneuropathy 01/29/2021   Prediabetes 10/10/2019   Problems influencing health status 09/07/2018   Rash 08/08/2019   Seasonal allergic rhinitis due to pollen 11/29/2018   Seasonal allergies 03/06/2020   Seasonal asthma 11/29/2018   Sleep apnea    Somatic dysfunction of sacroiliac joint (Bilateral) 09/27/2018   Spondylosis without myelopathy or radiculopathy, cervical region 03/22/2019   Spondylosis  without myelopathy or radiculopathy, lumbosacral region 05/19/2019   Spondylosis, cervical, w/ myelopathy 09/27/2018   Vaginal discharge 06/11/2020    Past Surgical History: Past Surgical History:  Procedure Laterality Date   ABDOMINAL HYSTERECTOMY  10/17/2020   [facility]; she has both ovaries   ANTERIOR CERVICAL DECOMP/DISCECTOMY FUSION N/A 07/01/2017   Procedure: ANTERIOR CERVICAL DECOMPRESSION/DISCECTOMY FUSION 3 LEVELS;  Surgeon: Venetia Night, MD;  Location: [facility] ORS;  Service: Neurosurgery;  Laterality: N/A;   CARPAL TUNNEL RELEASE Bilateral    KNEE ARTHROSCOPY Right     Allergies: Allergies as of 12/07/2023 - Review Complete 12/07/2023  Allergen Reaction Noted   Other Hives 06/25/2017   Pineapple  01/29/2021   Shellfish allergy Hives 06/25/2017   Strawberry (diagnostic) Hives 06/25/2017   Tomato Hives 06/25/2017    Medications: Outpatient Encounter Medications as of 12/07/2023  Medication Sig   albuterol (VENTOLIN HFA) 108 (90 Base) MCG/ACT inhaler Inhale 2 puffs into the lungs every 6 (six) hours as needed for wheezing or shortness of breath.   amLODipine (NORVASC) 5 MG tablet Take 1 tablet (5 mg total) by mouth daily.   atorvastatin (LIPITOR) 10 MG tablet MWF at night   buprenorphine (BUTRANS) 15 MCG/HR Place onto the skin once a week.   busPIRone (BUSPAR) 10 MG tablet Take 1 tablet (10 mg total) by mouth 2 (two) times daily.   cetirizine (ZYRTEC ALLERGY) 10 MG tablet Take 1 tablet (10 mg total) by mouth  at bedtime as needed.   Cholecalciferol (VITAMIN D-3) 125 MCG (5000 UT) TABS Take by mouth daily.   Cyanocobalamin (B-12 PO) Take by mouth.   dicyclomine (BENTYL) 20 MG tablet Take 1 tablet (20 mg total) by mouth 4 (four) times daily -  before meals and at bedtime.   DULoxetine (CYMBALTA) 60 MG capsule Take 1 capsule (60 mg total) by mouth 2 (two) times daily.   EPINEPHrine (AUVI-Q) 0.3 mg/0.3 mL IJ SOAJ injection Use as directed for severe allergic reaction    fluconazole (DIFLUCAN) 150 MG tablet Take 1 tablet by mouth x 1 dose. May repeat in 72 hours if needed.   fluticasone (FLONASE) 50 MCG/ACT nasal spray Place 2 sprays into both nostrils daily.   hydrocortisone 2.5 % cream Apply topically 2 (two) times daily. Prn to eyelids   hydrOXYzine (VISTARIL) 25 MG capsule Take 1 capsule (25 mg total) by mouth 3 (three) times daily.   ibuprofen (ADVIL) 800 MG tablet Take 800 mg by mouth 3 (three) times daily.   losartan (COZAAR) 100 MG tablet Take 1 tablet (100 mg total) by mouth daily.   meloxicam (MOBIC) 15 MG tablet Take 1 tablet (15 mg total) by mouth every other day as needed.   methocarbamol (ROBAXIN) 750 MG tablet Take 1 tablet (750 mg total) by mouth every 8 (eight) hours as needed for muscle spasms.   montelukast (SINGULAIR) 10 MG tablet Take 1 tablet (10 mg total) by mouth at bedtime.   pantoprazole (PROTONIX) 40 MG tablet TAKE ONE TABLET EVERY DAY 30 MIN BEFORE DINNER   polyethylene glycol powder (GLYCOLAX/MIRALAX) 17 GM/SCOOP powder Take 17 g by mouth 2 (two) times daily as needed.   potassium chloride SA (KLOR-CON M) 20 MEQ tablet Take 1 tablet (20 mEq total) by mouth daily as needed. With lasix   predniSONE (DELTASONE) 5 MG tablet Take by mouth.   pregabalin (LYRICA) 75 MG capsule Take 1 capsule (75 mg total) by mouth 3 (three) times daily.   saccharomyces boulardii (FLORASTOR) 250 MG capsule Take 1 capsule (250 mg total) by mouth daily.   triamcinolone cream (KENALOG) 0.1 % Apply 1 Application topically 2 (two) times daily.   No facility-administered encounter medications on file as of 12/07/2023.    Social History: Social History   Tobacco Use   Smoking status: Never   Smokeless tobacco: Never  Vaping Use   Vaping status: Never Used  Substance Use Topics   Alcohol use: Yes    Alcohol/week: 2.0 standard drinks of alcohol    Types: 2 Glasses of wine per week    Comment: occ   Drug use: No    Family Medical History: Family History   Problem Relation Age of Onset   [name] Mother    Diabetes Mother    Arthritis Mother    Hypertension Mother    Hyperlipidemia Mother    Hypertension Father    Arthritis Father    Hearing loss Father    Kidney disease Father    Lymphoma Father        large b cell    Arthritis Sister    Hypertension Sister    Cancer Brother        ? lung due to exposure    COPD Brother    Early death Brother    Sleep apnea Daughter    COPD Son    Depression Son    Breast cancer Maternal Aunt    Breast cancer Paternal Aunt    Allergic  rhinitis Neg Hx    Angioedema Neg Hx    Eczema Neg Hx    Urticaria Neg Hx    Asthma Neg Hx     Physical Examination: Vitals:   12/07/23 1302  BP: 138/86    General: Patient is well developed, well nourished, calm, collected, and in no apparent distress. Attention to examination is appropriate.  Respiratory: Patient is breathing without any difficulty.   NEUROLOGICAL:     Awake, alert, oriented to person, place, and time.  Speech is clear and fluent. Fund of knowledge is appropriate.   Cranial Nerves: Pupils equal round and reactive to light.  Facial tone is symmetric.    Well healed cervical incision.   No abnormal lesions on exposed skin.   Strength: Side Biceps Triceps Deltoid Interossei Grip Wrist Ext. Wrist Flex.  R 5 5 5 5 5 5 5   L 5 5 5 4 4 4 4    Side Iliopsoas Quads Hamstring PF DF EHL  R 5 5 5 5 5 5   L 5 5 5 5 5 5    Reflexes are 3+ and symmetric at the biceps, brachioradialis, patella and achilles.   Hoffman's is positive bilaterally.  Clonus is not present.   Bilateral upper and lower extremity sensation is intact to light touch.     Gait is slow.   Medical Decision Making  Imaging: Cervical MRI dated 01/02/21:  FINDINGS: Alignment: Straightening of lordosis.   Vertebrae: No fracture or aggressive osseous lesion. Sequela of C4-7 ACDF with associated susceptibility artifact.   Cord: Normal signal and morphology.    Posterior Fossa, vertebral arteries: Normal flow voids. Partially CSF filled sella turcica, nonspecific.   Disc levels: Multilevel desiccation.   C2-3: Shallow central protrusion and bilateral facet degenerative spurring. Patent spinal canal and neural foramen.   C3-4: Small central protrusion with uncovertebral and facet degenerative spurring. Patent spinal canal and right neural foramen. Mild left neural foraminal narrowing.   C4-5: Sequela of fusion. Patent right neural foramen. Mild spinal canal and left neural foraminal narrowing.   C5-6: Sequela of fusion. Patent spinal canal and right neural foramen. Minimal to mild left neural foraminal narrowing.   C6-7: Sequela of fusion. Patent spinal canal and right neural foramen. Left uncovertebral degenerative spurring with minimal neural foraminal narrowing.   C7-T1: Bilateral uncovertebral and facet degenerative spurring. Patent spinal canal. Minimal to mild bilateral neural foraminal narrowing.   Paraspinal tissues: Negative.   IMPRESSION: Sequela of C4-7 ACDF. Minimal to mild left neural foraminal narrowing at these levels.   Mild C4-5 spinal canal narrowing.   Mild left C3-4 and bilateral C7-T1 neural foraminal narrowing.     Electronically Signed   By: Stana Bunting M.D.   On: 01/02/2021 12:21   Cervical xrays dated 01/02/21:  FINDINGS: Signs of multilevel plate and screw fixation of the cervical spine, ACDF without change compared to previous imaging. This spans C4 through C7.   No new radiopaque foreign bodies to indicate wires in the soft tissues of the neck. Alignment is unchanged.   Signs of associated degenerative change as before.   IMPRESSION: No change in the appearance of multilevel plate and screw fixation of cervical spine ACDF. No additional metallic structures in the neck or new finding since previous imaging.     Electronically Signed   By: Donzetta Kohut M.D.   On: 01/02/2021  09:43  I have personally reviewed the images and agree with the above interpretation.  Assessment and Plan: Ms. Hamed is  s/p C4-C7 ACDF on 07/01/17 by Dr. Myer Haff.   She has constant neck pain radiating down left arm to the hand causing numbness and tingling. She has weakness in left hand as well. No right arm pain.   She is dropping things with left hand. No dexterity issues. She has some balance issues.   MRI from 01/02/21 showed mild left foraminal stenosis C4-C7 and mild left foraminal stenosis C3-C4 with mild bilateral foraminal stenosis C7-T1.   Cervical xrays from 01/02/21 show ACDF C4-C7.   She has some left arm weakness in the hand- she thinks it's chronic. Has positive hoffmans bilaterally and is hyer-reflexic- her previous ACDF was done for myelopathy. No dexterity issues. She has some balance issues that are chronic.   Treatment options discussed with patient and following plan made:   - CT scan of cervical spine to evaluate healing of previous cervical fusion.  - Xrays of cervical spine with flex/ext.  - Will schedule phone visit to review imaging results once I get them back.  - May need to get cervical MRI at some point as well.   I spent a total of 35 minutes in face-to-face and non-face-to-face activities related to this patient's care today including review of outside records, review of imaging, review of symptoms, physical exam, discussion of differential diagnosis, discussion of treatment options, and documentation.   Thank you for involving me in the care of this patient.   Drake Leach PA-C Dept. of Neurosurgery

## 2023-12-04 ENCOUNTER — Ambulatory Visit: Admitting: Clinical

## 2023-12-04 ENCOUNTER — Encounter: Payer: Self-pay | Admitting: Clinical

## 2023-12-04 DIAGNOSIS — F410 Panic disorder [episodic paroxysmal anxiety] without agoraphobia: Secondary | ICD-10-CM | POA: Diagnosis not present

## 2023-12-04 DIAGNOSIS — F411 Generalized anxiety disorder: Secondary | ICD-10-CM | POA: Diagnosis not present

## 2023-12-04 DIAGNOSIS — F32A Depression, unspecified: Secondary | ICD-10-CM | POA: Diagnosis not present

## 2023-12-04 NOTE — Progress Notes (Signed)
 [facility] Counselor/Therapist Progress Note  Patient ID: Anita Gibbs, MRN: [id number]    Date: 12/04/23  Time Spent: 2:32  pm - 3:19 pm : 47 Minutes  Treatment Type: Individual Therapy.  Reported Symptoms: frustration, depressed mood  Mental Status Exam: Appearance:  Neat and Well Groomed     Behavior: Appropriate  Motor: Normal  Speech/Language:  Clear and Coherent and Normal Rate  Affect: Appropriate  Mood: frustrated  Thought process: normal  Thought content:   WNL  Sensory/Perceptual disturbances:   WNL  Orientation: oriented to person, place, and situation  Attention: Good  Concentration: Good  Memory: WNL  Fund of knowledge:  Good  Insight:   Good  Judgment:  Good  Impulse Control: Good   Risk Assessment: Danger to Self:  No Patient denied current suicidal ideation  Self-injurious Behavior: No Danger to Others: No Patient denied current homicidal ideation Duty to Warn:no Physical Aggression / Violence:No  Access to Firearms a concern: No  Gang Involvement:No   Subjective:  Patient stated, "its ok", "its just a waiting game", "I reached out to the people I need to for the house" in response to events since last session. Patient reported patient sent an email to the mortgage company and received a response acknowledging receipt of patient's email. Patient stated, "not good" in response to physical health and mood since last session. Patient stated, "mentally its just whatever, I'm getting to a point where I don't care". Patient reported feeling frustrated today. Patient stated, "I want to be happy again", "me laughing, smiling, just enjoying life". Patient stated, "spend more time with my family" in response to potential goals. Patient stated, "to get my household in order", "become financially stable" in response to potential goals. Patient reported she experienced a panic attack yesterday and reported she felt the panic attack was triggered by intense  physical pain.    Interventions: Motivational Interviewing. Clinician conducted session via caregility video from clinician's home office. Patient provided verbal consent to proceed with telehealth session and is aware of limitations of telephone or video visits. Patient participated in session from patient's home. Reviewed events since last session and assessed for changes. Clinician utilized motivational interviewing to explore potential goals for therapy. Clinician utilized a task centered approach in collaboration with patient to develop goals for therapy. Patient participated in development of goals and agreed to goals for therapy.   Collaboration of Care: Other not required at this time  Diagnosis:  Panic disorder  Generalized anxiety disorder  Depression, unspecified depression type   Plan: Patient is to utilize [employer] [facility], thought re-framing, behavioral activation, relaxation techniques, mindfulness and coping strategies to decrease symptoms associated with their diagnosis. Frequency: bi-weekly  Modality: individual     Long-term goal:   Reduce overall level, frequency, and intensity of the feelings of depression, anxiety and panic as evidenced by decreased panic attacks,fear of future panic attacks, lack of  concentration, difficulty falling asleep and staying asleep, muscle tension, feeling on edge, restlessness, and irritability from 7 days/week to 1 to 3 days/week per patient report for at least 3 consecutive months. Target Date: 12/03/24  Progress: established 12/04/23   Short-term goal:  Identify and access local resources to increase patient's financial stability Target Date: 06/04/24  Progress: established 12/04/23   Decrease feelings of panic/anxiety from 4 to 5 days per week to 1 to 2 days per week by implementing healthy coping strategies, such as, relaxation techniques and mindfulness exercises Target Date: 06/04/24  Progress:  established 12/04/23    Identify, challenge, and replace negative thought patterns and negative self talk that contribute to feelings of depression and anxiety with positive thoughts, beliefs, and positive self talk per patient's report Target Date: 06/04/24  Progress: established 12/04/23   Increase patient's participation in activities patient enjoys from 1 time per week to 3 times per week  Target Date: 06/04/24  Progress: established 12/04/23                      Doree Barthel, LCSW

## 2023-12-05 NOTE — Therapy (Signed)
 OUTPATIENT PHYSICAL THERAPY THORACOLUMBAR EVALUATION FUNCTIONAL CAPACITY EVALUATION   Patient Name: Anita Gibbs MRN: [id number] DOB:1971/05/14, 53 y.o., female Today's Date: 12/02/2023  END OF SESSION:  PT End of Session - 12/05/23 1851     Visit Number 1    Number of Visits 1    Date for PT Re-Evaluation 12/03/23    PT Start Time 0856    PT Stop Time 1046    PT Time Calculation (min) 110 min             Past Medical History:  Diagnosis Date   Abdominal pain 06/11/2020   Abnormal drug screen 03/06/2020   Abnormal MRI, cervical spine (06/10/2018) 09/27/2018   FINDINGS:  Vertebrae: fusion hardware at C4, C5, C6, and C7.  Posterior Fossa, vertebral arteries, paraspinal tissues: A relatively empty sella present.     Disc levels:  C3-4: Negative. A mild broad-based disc osteophyte complex present. Uncovertebral spurring contributes to mild foraminal narrowing bilaterally.  C4-5: A leftward disc osteophyte complex is present. Uncovertebral and facet disease   Acute cystitis without hematuria 06/18/2020   Acute vaginitis 06/11/2020   Adenoma of left adrenal gland 08/01/2020   Adrenal nodule (HCC) 06/20/2020   Allergy    ANA positive 02/01/2021   Anxiety and depression 01/29/2021   Arthritis    Back pain with history of spinal surgery 01/29/2021   Bilateral carpal tunnel syndrome 01/29/2021   Bilateral leg edema 01/29/2021   BMI 40.0-44.9, adult (HCC) 04/30/2022   Carpal boss, right    Carpal tunnel syndrome, bilateral    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) 09/27/2018   Levels:  C4-5: There is partial effacement of ventral CSF.     IMPRESSION:  Mild residual central canal narrowing at C4-5 s/p ACDF.   Cervical facet hypertrophy (C3-T1) 09/27/2018   Levels:  C3-4: Uncovertebral spurring  C4-5: Uncovertebral and facet disease  C5-6: Residual uncovertebral disease.  C6-7: Residual uncovertebral disease.  C7-T1: Minimal left-sided uncovertebral spurring   Cervical facet  joint syndrome (Bilateral) (L>R) 09/27/2018   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-5, C5-6, and C6-7) 09/27/2018   Levels:  C3-4: Mild foraminal narrowing bilaterally.  C4-5: Mild left foraminal narrowing.  C5-6: Mild left foraminal narrowing is due to residual uncovertebral disease.  C6-7: Mild left foraminal narrowing is due to residual uncovertebral disease.   Cervical myelopathy (HCC) 07/01/2017   Cervicalgia (Primary Area of Pain) (Bilateral) (L>R) 09/27/2018   Chronic gout of foot (Left) 05/31/2018   Chronic low back pain [facility] Area of Pain) (Bilateral) (R>L) w/ sciatica (Bilateral) 09/07/2018   Chronic low back pain [facility] Area of Pain) (Bilateral) (R>L) w/o sciatica 07/26/2018   Chronic lower extremity pain (Fourth Area of Pain) (Bilateral) (R>L) 09/07/2018   Chronic musculoskeletal pain 10/27/2018   Chronic neck pain (Bilateral) w/ history of cervical spinal surgery 09/27/2018   Chronic neck pain (Primary Area of Pain) (Bilateral) (L>R) 09/07/2018   Chronic sacroiliac joint dysfunction (Bilateral) 09/27/2018   Chronic sacroiliac joint pain (Right) 09/07/2018   Chronic upper extremity pain (Secondary Area of Pain) (Bilateral) (R>L) 09/27/2018   Disorder of skeletal system 09/07/2018   E. coli UTI (urinary tract infection) 06/20/2020   Elevated sed rate 09/08/2018   Elevated serum creatinine 06/20/2020   Excessive daytime sleepiness 03/06/2020   Female pelvic pain 09/05/2020   GERD (gastroesophageal reflux disease)    Headache    History of allergy to shellfish 09/28/2018   History of fusion of cervical spine (ACDF C4-C7) 09/27/2018  History of illicit drug use 03/07/2020   Hives 09/05/2020   Hives 09/05/2020   Hyperkalemia 05/31/2018   Mild   Hyperlipidemia 04/30/2022   Hypertension    Large breasts 05/30/2020   Left hip pain 07/21/2022   Lumbar facet arthropathy 04/20/2019   Lumbar radiculitis (L5 dermatome) (Right) 04/17/2020   Multiple  environmental allergies 01/17/2020   Multiple food allergies 03/07/2020   Neurogenic pain 10/27/2018   Numbness and tingling of both feet 05/31/2018   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   Other intervertebral disc degeneration, lumbar region 11/15/2019   Pharmacologic therapy 09/07/2018   Plantar fasciitis, bilateral 03/04/2021   Polyneuropathy 01/29/2021   Prediabetes 10/10/2019   Problems influencing health status 09/07/2018   Rash 08/08/2019   Seasonal allergic rhinitis due to pollen 11/29/2018   Seasonal allergies 03/06/2020   Seasonal asthma 11/29/2018   Sleep apnea    Somatic dysfunction of sacroiliac joint (Bilateral) 09/27/2018   Spondylosis without myelopathy or radiculopathy, cervical region 03/22/2019   Spondylosis without myelopathy or radiculopathy, lumbosacral region 05/19/2019   Spondylosis, cervical, w/ myelopathy 09/27/2018   Vaginal discharge 06/11/2020   Past Surgical History:  Procedure Laterality Date   ABDOMINAL HYSTERECTOMY  10/17/2020   [facility]; she has both ovaries   ANTERIOR CERVICAL DECOMP/DISCECTOMY FUSION N/A 07/01/2017   Procedure: ANTERIOR CERVICAL DECOMPRESSION/DISCECTOMY FUSION 3 LEVELS;  Surgeon: Venetia Night, MD;  Location: [facility] ORS;  Service: Neurosurgery;  Laterality: N/A;   CARPAL TUNNEL RELEASE Bilateral    KNEE ARTHROSCOPY Right    Patient Active Problem List   Diagnosis Date Noted   Palpitations 11/24/2023   Wasp sting 11/24/2023   AKI (acute kidney injury) (HCC) 11/02/2023   Other chest pain 10/29/2023   Generalized anxiety disorder with panic attacks 09/30/2023   Lower abdominal pain 07/22/2023   Abnormal glucose 04/24/2023   Mood disorder (HCC) 04/24/2023   Annual physical exam 04/24/2023   Anemia 04/24/2023   Chronic frontal sinusitis 10/08/2022   Abdominal pain, left lower quadrant 06/30/2022   Allergic rhinitis 03/04/2021   Bilateral leg edema 01/29/2021   Lumbosacral radiculopathy at L5 01/29/2021    Neuropathy 01/29/2021   Morbid obesity with BMI of 45.0-49.9, adult (HCC) 06/26/2020   Vaginal discharge 06/11/2020   Pain medication agreement broken 03/07/2020   Prediabetes 10/10/2019   HLD (hyperlipidemia) 10/10/2019   Gastroesophageal reflux disease 09/06/2019   Fibroid uterus 09/06/2019   Seasonal asthma 11/29/2018   DDD (degenerative disc disease), cervical 09/27/2018   Cervical spondylosis 09/27/2018   DDD (degenerative disc disease), lumbar 09/27/2018   Vitamin D deficiency 09/27/2018   Chronic pain syndrome 09/07/2018   Essential hypertension 05/31/2018    PCP: [name], NP  REFERRING PROVIDER: [name], NP  REFERRING DIAG: G89.4 (ICD-10-CM) - Chronic pain syndrome   Rationale for Evaluation and Treatment: Rehabilitation  THERAPY DIAG:  Other low back pain  Muscle weakness (generalized)  Chronic pain of both knees  Gait difficulty  Balance disorder  ONSET DATE: chronic  SEE [facility] REPORT     Cammie Mcgee, PT 12/05/2023, 6:54 PM

## 2023-12-07 ENCOUNTER — Telehealth: Payer: Self-pay | Admitting: Nurse Practitioner

## 2023-12-07 ENCOUNTER — Other Ambulatory Visit: Payer: Self-pay | Admitting: Nurse Practitioner

## 2023-12-07 ENCOUNTER — Ambulatory Visit (INDEPENDENT_AMBULATORY_CARE_PROVIDER_SITE_OTHER): Admitting: Orthopedic Surgery

## 2023-12-07 ENCOUNTER — Encounter: Payer: Self-pay | Admitting: Orthopedic Surgery

## 2023-12-07 VITALS — BP 138/86 | Ht 63.0 in | Wt 240.0 lb

## 2023-12-07 DIAGNOSIS — Z1231 Encounter for screening mammogram for malignant neoplasm of breast: Secondary | ICD-10-CM

## 2023-12-07 DIAGNOSIS — M4722 Other spondylosis with radiculopathy, cervical region: Secondary | ICD-10-CM

## 2023-12-07 DIAGNOSIS — M5416 Radiculopathy, lumbar region: Secondary | ICD-10-CM | POA: Diagnosis not present

## 2023-12-07 DIAGNOSIS — Z981 Arthrodesis status: Secondary | ICD-10-CM

## 2023-12-07 DIAGNOSIS — M542 Cervicalgia: Secondary | ICD-10-CM

## 2023-12-07 DIAGNOSIS — R29898 Other symptoms and signs involving the musculoskeletal system: Secondary | ICD-10-CM | POA: Diagnosis not present

## 2023-12-07 DIAGNOSIS — M47812 Spondylosis without myelopathy or radiculopathy, cervical region: Secondary | ICD-10-CM

## 2023-12-07 DIAGNOSIS — M4802 Spinal stenosis, cervical region: Secondary | ICD-10-CM | POA: Diagnosis not present

## 2023-12-07 DIAGNOSIS — R2689 Other abnormalities of gait and mobility: Secondary | ICD-10-CM | POA: Diagnosis not present

## 2023-12-07 DIAGNOSIS — M5412 Radiculopathy, cervical region: Secondary | ICD-10-CM

## 2023-12-07 DIAGNOSIS — G8929 Other chronic pain: Secondary | ICD-10-CM | POA: Diagnosis not present

## 2023-12-07 DIAGNOSIS — M4803 Spinal stenosis, cervicothoracic region: Secondary | ICD-10-CM

## 2023-12-07 DIAGNOSIS — M5442 Lumbago with sciatica, left side: Secondary | ICD-10-CM | POA: Diagnosis not present

## 2023-12-07 DIAGNOSIS — R262 Difficulty in walking, not elsewhere classified: Secondary | ICD-10-CM | POA: Diagnosis not present

## 2023-12-07 DIAGNOSIS — R200 Anesthesia of skin: Secondary | ICD-10-CM

## 2023-12-07 NOTE — Telephone Encounter (Signed)
 Copied from CRM [id number]. Topic: General - Other >> Dec 07, 2023 12:06 PM Gurney Maxin H wrote: Reason for CRM: Patient is calling to see if she can have a copy of the [facility] report that was done, patient states she can pick it up, please reach out to patient, thanks.  [place] [phone]

## 2023-12-07 NOTE — Patient Instructions (Signed)
 It was so nice to see you today. Thank you so much for coming in.    I want to get a CT of your neck to look into things further. We will get this approved through your insurance and [facility] will call you to schedule the appointment. Ask about your patient responsibility. You do not need to pay this prior to getting CT, they can bill you. When you get the CT scan done, remind them to get xrays of your neck done as well.   [facility] (building with the white pillars) is located off of [address]. The address is [address], [place], [place] [address].    After you have the CT and imaging, it takes 14-21 days for me to get the results back. Once I have them, we will call you to schedule a follow up phone visit with me to review them.   Please do not hesitate to call if you have any questions or concerns. You can also message me in MyChart.   Drake Leach PA-C [phone]     The physicians and staff at [facility] are committed to providing excellent care. You may receive a survey asking for feedback about your experience at our office. We value you your feedback and appreciate you taking the time to to fill it out. The [facility] leadership team is also available to discuss your experience in person, feel free to contact us [phone].

## 2023-12-08 NOTE — Telephone Encounter (Signed)
 Pt sent a mychart msg in regards to this and I responded via mychart thread

## 2023-12-09 NOTE — Addendum Note (Signed)
 Addended by: Cammie Mcgee on: 12/09/2023 09:10 AM   Modules accepted: Orders

## 2023-12-10 ENCOUNTER — Telehealth: Payer: Self-pay

## 2023-12-10 NOTE — Telephone Encounter (Signed)
 Called pt and informed her that we have received her Physical Work performance evaluation and that her copy is ready for pick up.    Pts copy has been placed upfront for pt pick up in the designated pick up area.     Provider copy has been placed in to be signed folder for review per request.

## 2023-12-12 DIAGNOSIS — Z419 Encounter for procedure for purposes other than remedying health state, unspecified: Secondary | ICD-10-CM | POA: Diagnosis not present

## 2023-12-14 ENCOUNTER — Ambulatory Visit
Admission: RE | Admit: 2023-12-14 | Discharge: 2023-12-14 | Disposition: A | Discharge status: Home / Self Care | Source: Ambulatory Visit | Attending: Orthopedic Surgery | Admitting: Orthopedic Surgery

## 2023-12-14 DIAGNOSIS — G8929 Other chronic pain: Secondary | ICD-10-CM | POA: Diagnosis not present

## 2023-12-14 DIAGNOSIS — M47812 Spondylosis without myelopathy or radiculopathy, cervical region: Secondary | ICD-10-CM

## 2023-12-14 DIAGNOSIS — R29898 Other symptoms and signs involving the musculoskeletal system: Secondary | ICD-10-CM | POA: Diagnosis not present

## 2023-12-14 DIAGNOSIS — M5412 Radiculopathy, cervical region: Secondary | ICD-10-CM | POA: Diagnosis not present

## 2023-12-14 DIAGNOSIS — R202 Paresthesia of skin: Secondary | ICD-10-CM | POA: Insufficient documentation

## 2023-12-14 DIAGNOSIS — R200 Anesthesia of skin: Secondary | ICD-10-CM | POA: Insufficient documentation

## 2023-12-14 DIAGNOSIS — Z981 Arthrodesis status: Secondary | ICD-10-CM

## 2023-12-14 DIAGNOSIS — M542 Cervicalgia: Secondary | ICD-10-CM | POA: Diagnosis not present

## 2023-12-14 DIAGNOSIS — M4802 Spinal stenosis, cervical region: Secondary | ICD-10-CM | POA: Diagnosis not present

## 2023-12-15 ENCOUNTER — Ambulatory Visit
Admission: RE | Admit: 2023-12-15 | Discharge: 2023-12-15 | Disposition: A | Discharge status: Home / Self Care | Source: Ambulatory Visit | Attending: Nurse Practitioner | Admitting: Nurse Practitioner

## 2023-12-15 DIAGNOSIS — M47819 Spondylosis without myelopathy or radiculopathy, site unspecified: Secondary | ICD-10-CM | POA: Diagnosis not present

## 2023-12-15 DIAGNOSIS — Z1231 Encounter for screening mammogram for malignant neoplasm of breast: Secondary | ICD-10-CM | POA: Insufficient documentation

## 2023-12-16 ENCOUNTER — Ambulatory Visit (INDEPENDENT_AMBULATORY_CARE_PROVIDER_SITE_OTHER): Admitting: Clinical

## 2023-12-16 DIAGNOSIS — F410 Panic disorder [episodic paroxysmal anxiety] without agoraphobia: Secondary | ICD-10-CM

## 2023-12-16 DIAGNOSIS — F411 Generalized anxiety disorder: Secondary | ICD-10-CM

## 2023-12-16 DIAGNOSIS — F32A Depression, unspecified: Secondary | ICD-10-CM

## 2023-12-16 NOTE — Progress Notes (Signed)
 [facility] Counselor/Therapist Progress Note  Patient ID: Anita Gibbs, MRN: [id number],    Date: 12/16/2023  Time Spent: 9:36am - 10:24am : 48 minutes   Treatment Type: Individual Therapy  Reported Symptoms: sadness, tearful  Mental Status Exam: Appearance:  Neat and Well Groomed     Behavior: Evasive  Motor: Normal  Speech/Language:  Clear and Coherent and Normal Rate  Affect: Tearful  Mood: sad  Thought process: normal  Thought content:   WNL  Sensory/Perceptual disturbances:   WNL  Orientation: oriented to person, place, time/date, and situation  Attention: Good  Concentration: Good  Memory: WNL  Fund of knowledge:  Good  Insight:   Fair  Judgment:  Good  Impulse Control: Good   Risk Assessment: Danger to Self:  No Patient denied current suicidal ideation  Self-injurious Behavior: No Danger to Others: No Patient denied current homicidal ideation Duty to Warn:no Physical Aggression / Violence:No  Access to Firearms a concern: No  Gang Involvement:No   Subjective: Patient stated, "ok" in response to events since last session. Patient stated, "I'm getting ready to lose the house". Patient stated, "no one is able to help with that". Patient reported patient's house is currently in foreclosure and the auction date is May 28th. Patient stated, "at this point I really don't care". Patient reported she could move in with patient's significant other but doesn't want to and stated, "I've been imposing on him enough already". Patient stated, "I'm just sad". Patient reported she was angry about the situation and stated, "my hands are tied now so I don't care". Patient stated, "I've done lost everything" and stated,  "everything is everything". Patient stated, "it has peaked" in reference to symptoms of anxiety. Patient reported she has an upcoming appointment with a psychiatrist on May 19 th. Patient stated, "they're helpful sometimes" in response to use of deep  breathing and imagery. Patient reported she tries to think about something positive when feeling anxious and stated, "there's nothing positive right now". Patient identified the following positives: patient's grandson turned 3 today, patient was able to spend time with her grandson, hugs from grandson, celebrated grandson's birthday, and support from significant other. Patient stated, "I'm just a burden" and stated, "because he (significant other) tries to make sure I'm good, he was paying my bills". Patient stated, "ill try" in response to homework assignment.   Interventions: Cognitive Behavioral Therapy and task centered . Clinician conducted session in person at clinician's office at [place]. Reviewed events since last session and assessed for changes. Discussed current stressors. Explored patient's thoughts and feelings in response to foreclosure. Explored housing options. Discussed local resources for mortgage assistance, housing, vocational rehabilitation and provided patient with contact information for local resources. Clinician clarified patient's statement "I've done lost everything" and explored patient's meaning of "everything". Assessed intensity and frequency of anxiety. Reviewed deep breathing and imagery exercises. Assisted patient in exploring and identifying positives. Assisted patient in exploring and identifying triggers and thoughts associated with patient's statement, "I'm just a burden". Challenged statements to assist patient in reframing situations. Reviewed challenging thoughts and identifying evidence for/against thoughts. Clinician requested for homework patient practice identifying one positive aspect each day.     Collaboration of Care: Other not required at this time   Diagnosis:  Panic disorder   Generalized anxiety disorder   Depression, unspecified depression type     Plan: Patient is to utilize [employer], thought re-framing, behavioral  activation, relaxation techniques, mindfulness and coping  strategies to decrease symptoms associated with their diagnosis. Frequency: bi-weekly  Modality: individual      Long-term goal:   Reduce overall level, frequency, and intensity of the feelings of depression, anxiety and panic as evidenced by decreased panic attacks,fear of future panic attacks, lack of  concentration, difficulty falling asleep and staying asleep, muscle tension, feeling on edge, restlessness, and irritability from 7 days/week to 1 to 3 days/week per patient report for at least 3 consecutive months. Target Date: 12/03/24  Progress: progressing    Short-term goal:  Identify and access local resources to increase patient's financial stability Target Date: 06/04/24  Progress: progressing    Decrease feelings of panic/anxiety from 4 to 5 days per week to 1 to 2 days per week by implementing healthy coping strategies, such as, relaxation techniques and mindfulness exercises Target Date: 06/04/24  Progress: progressing    Identify, challenge, and replace negative thought patterns and negative self talk that contribute to feelings of depression and anxiety with positive thoughts, beliefs, and positive self talk per patient's report Target Date: 06/04/24  Progress: progressing    Increase patient's participation in activities patient enjoys from 1 time per week to 3 times per week  Target Date: 06/04/24  Progress: progressing                            Burlene Carpen, LCSW

## 2023-12-16 NOTE — Progress Notes (Deleted)
 [facility] Counselor Initial Adult Exam  Name: Anita Gibbs Date: 12/16/2023 MRN: [id number] DOB: February 27, 1971 PCP: Bluford Burkitt, NP  Time spent: ***  Guardian/Payee:  ***    Paperwork requested: {WUJ:81191}  Reason for Visit Anita Gibbs Problem: ***  Mental Status Exam: Appearance:   {PSY:22683}     Behavior:  {PSY:21022743}  Motor:  {PSY:22302}  Speech/Language:   {YNW:29562}  Affect:  {PSY:22687}  Mood:  {PSY:31886}  Thought process:  {ZHY:86578}  Thought content:    {[phone]}  Sensory/Perceptual disturbances:    {[phone]}  Orientation:  {PSY:30297}  Attention:  {PSY:22877}  Concentration:  {PSY:813-121-7787}  Memory:  {ION:6295284132}  Fund of knowledge:   {GMW:1027253664}  Insight:    {PSY:813-121-7787}  Judgment:   {PSY:813-121-7787}  Impulse Control:  {PSY:813-121-7787}   Appearance: {PSY:22683}    Behavior: {PSY:21022743} Motor: {PSY:22302} Speech/Language: {PSY:22685} Affect: {PSY:22687} Mood: {PSY:31886} Thought process: {PSY:31888} Thought content: {[phone]} Sensory/Perceptual disturbances: {[phone]} Orientation: {PSY:30297} Attention: {PSY:22877} Concentration: {PSY:813-121-7787} Memory: {PSY:302 564 5628} Fund of knowledge: {PSY:813-121-7787} Insight: {PSY:813-121-7787} Judgment: {PSY:813-121-7787} Impulse Control: {PSY:813-121-7787}  Reported Symptoms:  ***  Risk Assessment: Danger to Self:  {PSY:22692} Self-injurious Behavior: {PSY:22692} Danger to Others: {PSY:22692} Duty to Warn:{PSY:311194} Physical Aggression / Violence:{PSY:21197} Access to Firearms a concern: {PSY:21197} Gang Involvement:{PSY:21197} Patient / guardian was educated about steps to take if suicide or homicide risk level increases between visits: {Yes/No-Ex:120004} While future psychiatric events cannot be accurately predicted, the patient does not currently require acute inpatient psychiatric care and does not currently meet [place]   involuntary commitment criteria.  Substance Abuse History: Current substance abuse: {PSY:21197}    Past Psychiatric History:   {Past psych history:20559} Outpatient Providers:*** History of Psych Hospitalization: {PSY:21197} Psychological Testing: {PSY:21014032}   Abuse History:  Victim of: {Abuse History:314532}, {Type of abuse:20566}   Report needed: {PSY:314532} Victim of Neglect:{yes no:314532} Perpetrator of {PSY:20566}  Witness / Exposure to Domestic Violence: {PSY:21197}  Protective Services Involvement: {PSY:21197} Witness to [employer] Violence:  {PSY:21197}  Family History:  Family History  Problem Relation Age of Onset   [name] Mother    Diabetes Mother    Arthritis Mother    Hypertension Mother    Hyperlipidemia Mother    Hypertension Father    Arthritis Father    Hearing loss Father    Kidney disease Father    Lymphoma Father        large b cell    Arthritis Sister    Hypertension Sister    Cancer Brother        ? lung due to exposure    COPD Brother    Early death Brother    Sleep apnea Daughter    COPD Son    Depression Son    Breast cancer Maternal Aunt    Breast cancer Paternal Aunt    Allergic rhinitis Neg Hx    Angioedema Neg Hx    Eczema Neg Hx    Urticaria Neg Hx    Asthma Neg Hx     Living situation: the patient {lives:315711::"lives with their family"}  Sexual Orientation: {Sexual Orientation:4428578016}  Relationship Status: {Desc; marital status:62}  Name of spouse / other:*** If a parent, number of children / ages:***  Support Systems: {DIABETES SUPPORT:20310}  Financial Stress:  {YES/NO:21197}  Income/Employment/Disability: Manufacturing engineer: [employer]  Educational History: Education: {PSY :31912}  Religion/Sprituality/World View: {CHL AMB RELIGION/SPIRITUALITY:781-204-8424}  Any cultural differences that may affect / interfere with treatment:   {Religious/Cultural:200019}  Recreation/Hobbies: {Woc hobbies:30428}  Stressors: {PATIENT STRESSORS:22669}  Strengths: {Patient Coping [phone]}  Barriers:  ***   Legal History: Pending legal issue / charges: {PSY:20588} History of legal issue / charges: {Legal Issues:8623806050}  Medical History/Surgical History: {Desc; reviewed/not reviewed:60074} Past Medical History:  Diagnosis Date   Abdominal pain 06/11/2020   Abnormal drug screen 03/06/2020   Abnormal MRI, cervical spine (06/10/2018) 09/27/2018   FINDINGS:  Vertebrae: fusion hardware at C4, C5, C6, and C7.  Posterior Fossa, vertebral arteries, paraspinal tissues: A relatively empty sella present.     Disc levels:  C3-4: Negative. A mild broad-based disc osteophyte complex present. Uncovertebral spurring contributes to mild foraminal narrowing bilaterally.  C4-5: A leftward disc osteophyte complex is present. Uncovertebral and facet disease   Acute cystitis without hematuria 06/18/2020   Acute vaginitis 06/11/2020   Adenoma of left adrenal gland 08/01/2020   Adrenal nodule (HCC) 06/20/2020   Allergy    ANA positive 02/01/2021   Anxiety and depression 01/29/2021   Arthritis    Back pain with history of spinal surgery 01/29/2021   Bilateral carpal tunnel syndrome 01/29/2021   Bilateral leg edema 01/29/2021   BMI 40.0-44.9, adult (HCC) 04/30/2022   Carpal boss, right    Carpal tunnel syndrome, bilateral    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) 09/27/2018   Levels:  C4-5: There is partial effacement of ventral CSF.     IMPRESSION:  Mild residual central canal narrowing at C4-5 s/p ACDF.   Cervical facet hypertrophy (C3-T1) 09/27/2018   Levels:  C3-4: Uncovertebral spurring  C4-5: Uncovertebral and facet disease  C5-6: Residual uncovertebral disease.  C6-7: Residual uncovertebral disease.  C7-T1: Minimal left-sided uncovertebral spurring   Cervical facet joint syndrome (Bilateral) (L>R) 09/27/2018   Cervical  foraminal stenosis (Bilateral: C3-4) (Left: C4-5, C5-6, and C6-7) 09/27/2018   Levels:  C3-4: Mild foraminal narrowing bilaterally.  C4-5: Mild left foraminal narrowing.  C5-6: Mild left foraminal narrowing is due to residual uncovertebral disease.  C6-7: Mild left foraminal narrowing is due to residual uncovertebral disease.   Cervical myelopathy (HCC) 07/01/2017   Cervicalgia (Primary Area of Pain) (Bilateral) (L>R) 09/27/2018   Chronic gout of foot (Left) 05/31/2018   Chronic low back pain [facility] Area of Pain) (Bilateral) (R>L) w/ sciatica (Bilateral) 09/07/2018   Chronic low back pain [facility] Area of Pain) (Bilateral) (R>L) w/o sciatica 07/26/2018   Chronic lower extremity pain (Fourth Area of Pain) (Bilateral) (R>L) 09/07/2018   Chronic musculoskeletal pain 10/27/2018   Chronic neck pain (Bilateral) w/ history of cervical spinal surgery 09/27/2018   Chronic neck pain (Primary Area of Pain) (Bilateral) (L>R) 09/07/2018   Chronic sacroiliac joint dysfunction (Bilateral) 09/27/2018   Chronic sacroiliac joint pain (Right) 09/07/2018   Chronic upper extremity pain (Secondary Area of Pain) (Bilateral) (R>L) 09/27/2018   Disorder of skeletal system 09/07/2018   E. coli UTI (urinary tract infection) 06/20/2020   Elevated sed rate 09/08/2018   Elevated serum creatinine 06/20/2020   Excessive daytime sleepiness 03/06/2020   Female pelvic pain 09/05/2020   GERD (gastroesophageal reflux disease)    Headache    History of allergy to shellfish 09/28/2018   History of fusion of cervical spine (ACDF C4-C7) 09/27/2018   History of illicit drug use 03/07/2020   Hives 09/05/2020   Hives 09/05/2020   Hyperkalemia 05/31/2018   Mild   Hyperlipidemia 04/30/2022   Hypertension    Large breasts 05/30/2020   Left hip pain 07/21/2022   Lumbar facet arthropathy 04/20/2019   Lumbar radiculitis (L5 dermatome) (Right) 04/17/2020   Multiple environmental allergies 01/17/2020   Multiple food  allergies  03/07/2020   Neurogenic pain 10/27/2018   Numbness and tingling of both feet 05/31/2018   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   Other intervertebral disc degeneration, lumbar region 11/15/2019   Pharmacologic therapy 09/07/2018   Plantar fasciitis, bilateral 03/04/2021   Polyneuropathy 01/29/2021   Prediabetes 10/10/2019   Problems influencing health status 09/07/2018   Rash 08/08/2019   Seasonal allergic rhinitis due to pollen 11/29/2018   Seasonal allergies 03/06/2020   Seasonal asthma 11/29/2018   Sleep apnea    Somatic dysfunction of sacroiliac joint (Bilateral) 09/27/2018   Spondylosis without myelopathy or radiculopathy, cervical region 03/22/2019   Spondylosis without myelopathy or radiculopathy, lumbosacral region 05/19/2019   Spondylosis, cervical, w/ myelopathy 09/27/2018   Vaginal discharge 06/11/2020    Past Surgical History:  Procedure Laterality Date   ABDOMINAL HYSTERECTOMY  10/17/2020   [facility]; she has both ovaries   ANTERIOR CERVICAL DECOMP/DISCECTOMY FUSION N/A 07/01/2017   Procedure: ANTERIOR CERVICAL DECOMPRESSION/DISCECTOMY FUSION 3 LEVELS;  Surgeon: Jodeen Munch, MD;  Location: [facility] ORS;  Service: Neurosurgery;  Laterality: N/A;   CARPAL TUNNEL RELEASE Bilateral    KNEE ARTHROSCOPY Right     Medications: Current Outpatient Medications  Medication Sig Dispense Refill   albuterol (VENTOLIN HFA) 108 (90 Base) MCG/ACT inhaler Inhale 2 puffs into the lungs every 6 (six) hours as needed for wheezing or shortness of breath. 8 g 11   amLODipine (NORVASC) 5 MG tablet Take 1 tablet (5 mg total) by mouth daily. 90 tablet 0   atorvastatin (LIPITOR) 10 MG tablet MWF at night 90 tablet 3   buprenorphine (BUTRANS) 15 MCG/HR Place onto the skin once a week.     busPIRone (BUSPAR) 10 MG tablet Take 1 tablet (10 mg total) by mouth 2 (two) times daily. 60 tablet 1   cetirizine (ZYRTEC ALLERGY) 10 MG tablet Take 1 tablet (10 mg total) by mouth at  bedtime as needed. 90 tablet 3   Cholecalciferol (VITAMIN D-3) 125 MCG (5000 UT) TABS Take by mouth daily.     Cyanocobalamin (B-12 PO) Take by mouth.     dicyclomine (BENTYL) 20 MG tablet Take 1 tablet (20 mg total) by mouth 4 (four) times daily -  before meals and at bedtime. 120 tablet 1   DULoxetine (CYMBALTA) 60 MG capsule Take 1 capsule (60 mg total) by mouth 2 (two) times daily. 60 capsule 2   EPINEPHrine (AUVI-Q) 0.3 mg/0.3 mL IJ SOAJ injection Use as directed for severe allergic reaction 2 Device 2   fluconazole (DIFLUCAN) 150 MG tablet Take 1 tablet by mouth x 1 dose. May repeat in 72 hours if needed. 2 tablet 0   fluticasone (FLONASE) 50 MCG/ACT nasal spray Place 2 sprays into both nostrils daily. 16 g 6   hydrocortisone 2.5 % cream Apply topically 2 (two) times daily. Prn to eyelids 30 g 0   hydrOXYzine (VISTARIL) 25 MG capsule Take 1 capsule (25 mg total) by mouth 3 (three) times daily. 90 capsule 2   ibuprofen (ADVIL) 800 MG tablet Take 800 mg by mouth 3 (three) times daily.     losartan (COZAAR) 100 MG tablet Take 1 tablet (100 mg total) by mouth daily. 90 tablet 3   meloxicam (MOBIC) 15 MG tablet Take 1 tablet (15 mg total) by mouth every other day as needed. 45 tablet 11   methocarbamol (ROBAXIN) 750 MG tablet Take 1 tablet (750 mg total) by mouth every 8 (eight) hours as needed for muscle spasms. 90 tablet 11  montelukast (SINGULAIR) 10 MG tablet Take 1 tablet (10 mg total) by mouth at bedtime. 90 tablet 3   pantoprazole (PROTONIX) 40 MG tablet TAKE ONE TABLET EVERY DAY 30 MIN BEFORE DINNER 90 tablet 3   polyethylene glycol powder (GLYCOLAX/MIRALAX) 17 GM/SCOOP powder Take 17 g by mouth 2 (two) times daily as needed. 3350 g 1   potassium chloride SA (KLOR-CON M) 20 MEQ tablet Take 1 tablet (20 mEq total) by mouth daily as needed. With lasix 30 tablet 11   pregabalin (LYRICA) 75 MG capsule Take 1 capsule (75 mg total) by mouth 3 (three) times daily. 90 capsule 11   saccharomyces  boulardii (FLORASTOR) 250 MG capsule Take 1 capsule (250 mg total) by mouth daily. 90 capsule 0   triamcinolone cream (KENALOG) 0.1 % Apply 1 Application topically 2 (two) times daily. 30 g 0   No current facility-administered medications for this visit.    Allergies  Allergen Reactions   Other Hives    White and red sauces   Pineapple     Swelling    Shellfish Allergy Hives   Strawberry (Diagnostic) Hives   Tomato Hives    cherry    Diagnoses:  No diagnosis found.  Plan of Care: ***   Burlene Carpen, LCSW

## 2023-12-16 NOTE — Progress Notes (Signed)
   Doree Barthel, LCSW

## 2023-12-17 ENCOUNTER — Encounter: Payer: Self-pay | Admitting: Nurse Practitioner

## 2023-12-17 NOTE — Telephone Encounter (Signed)
Pt informed

## 2023-12-21 ENCOUNTER — Other Ambulatory Visit: Payer: Self-pay | Admitting: Student in an Organized Health Care Education/Training Program

## 2023-12-21 ENCOUNTER — Ambulatory Visit (INDEPENDENT_AMBULATORY_CARE_PROVIDER_SITE_OTHER): Admitting: Clinical

## 2023-12-21 DIAGNOSIS — F411 Generalized anxiety disorder: Secondary | ICD-10-CM | POA: Diagnosis not present

## 2023-12-21 DIAGNOSIS — F32A Depression, unspecified: Secondary | ICD-10-CM | POA: Diagnosis not present

## 2023-12-21 DIAGNOSIS — F410 Panic disorder [episodic paroxysmal anxiety] without agoraphobia: Secondary | ICD-10-CM

## 2023-12-21 NOTE — Progress Notes (Signed)
   Anita Barthel, LCSW

## 2023-12-21 NOTE — Progress Notes (Signed)
  Behavioral Health Counselor/Therapist Progress Note  Patient ID: Anita Gibbs, MRN: [id number],    Date: 12/21/2023  Time Spent: 2:32pm - 3:14pm : 42 minutes   Treatment Type: Individual Therapy  Reported Symptoms: depressed mood  Mental Status Exam: Appearance:  Neat and Well Groomed     Behavior: Evasive  Motor: Normal  Speech/Language:  Clear and Coherent and Normal Rate  Affect: Flat  Mood: depressed  Thought process: normal  Thought content:   WNL  Sensory/Perceptual disturbances:   WNL  Orientation: oriented to person, place, and situation  Attention: Good  Concentration: Good  Memory: WNL  Fund of knowledge:  Good  Insight:   Fair  Judgment:  Good  Impulse Control: Good   Risk Assessment: Danger to Self:  No Patient denied current suicidal ideation  Self-injurious Behavior: No Danger to Others: No Patient denied current homicidal ideation Duty to Warn:no Physical Aggression / Violence:No  Access to Firearms a concern: No  Gang Involvement:No   Subjective: Patient stated, "nothing's changed".  Patient reported "the same" and stated, "I don't care" in response to patient's mood since last session. Patient reported she currently dislikes individuals and stated, "they're phony". Patient reported discord with patient's sister. Patient stated, "the sale of the house is official as of the first of may". Patient reported she will be moving in three weeks due to the sale of the house. Patient stated, "I don't care" in response to sale of the house and upcoming move. Patient stated, "I'll find somewhere" in response to upcoming move. Patient stated, "I haven't had nothing positive, everything's been hate", "I'm just disliking things, everything's just a dislike". Patient reported she contacted local resources and resources were not able to assist with foreclosures. Patient reported she feels she is a "burden" to patient's significant other. Patient stated,  "I rely on  him for everything and that's all the facts I need", "I know he's stressed" and reported significant other has been working increased hours. Patient stated, "I'll eventually get over it but it's just not today". Patient stated, "not having anywhere to go and no finances to do anything with" as barriers to identifying positives. Patient stated, "I don't want to do nothing, what's the point". Patient reported deep breathing exercises have been helpful. Patient stated, "I'll try it" in response to progressive muscle relaxation. Patient stated, "I can try it" in response to IOP.   Interventions: Cognitive Behavioral Therapy. Clinician conducted session via caregility video from clinician's home office. Patient provided verbal consent to proceed with telehealth session and is aware of limitations of telephone or video visits. Patient participated in session from patient's home. Reviewed events since last session and assessed for changes. Discussed the sale of patient's home and patient's thoughts/feelings in response. Reviewed patient's homework. Explored barriers to homework assignment. Assisted patient in exploring positives. Challenged statements to assist patient in reframing situation. Assisted patient in challenging negative thoughts and identifying evidence for/against thoughts. Reviewed mindfulness exercises. Provided psycho education related to progressive muscle relaxation. Discussed referral for IOP services.    Collaboration of Care: Other discussed consent for IOP referral   Diagnosis:  Panic disorder   Generalized anxiety disorder   Depression, unspecified depression type     Plan: Patient is to utilize [employer] Therapy, thought re-framing, behavioral activation, relaxation techniques, mindfulness and coping strategies to decrease symptoms associated with their diagnosis. Frequency: bi-weekly  Modality: individual      Long-term goal:   Reduce overall level, frequency, and  intensity of the feelings of depression, anxiety and panic as evidenced by decreased panic attacks,fear of future panic attacks, lack of  concentration, difficulty falling asleep and staying asleep, muscle tension, feeling on edge, restlessness, and irritability from 7 days/week to 1 to 3 days/week per patient report for at least 3 consecutive months. Target Date: 12/03/24  Progress: progressing    Short-term goal:  Identify and access local resources to increase patient's financial stability Target Date: 06/04/24  Progress: progressing    Decrease feelings of panic/anxiety from 4 to 5 days per week to 1 to 2 days per week by implementing healthy coping strategies, such as, relaxation techniques and mindfulness exercises Target Date: 06/04/24  Progress: progressing    Identify, challenge, and replace negative thought patterns and negative self talk that contribute to feelings of depression and anxiety with positive thoughts, beliefs, and positive self talk per patient's report Target Date: 06/04/24  Progress: progressing    Increase patient's participation in activities patient enjoys from 1 time per week to 3 times per week  Target Date: 06/04/24  Progress: progressing                      Burlene Carpen, LCSW

## 2023-12-25 DIAGNOSIS — R0789 Other chest pain: Secondary | ICD-10-CM | POA: Diagnosis not present

## 2023-12-25 DIAGNOSIS — R002 Palpitations: Secondary | ICD-10-CM | POA: Diagnosis not present

## 2023-12-29 DIAGNOSIS — M47819 Spondylosis without myelopathy or radiculopathy, site unspecified: Secondary | ICD-10-CM | POA: Diagnosis not present

## 2023-12-31 NOTE — Telephone Encounter (Signed)
 Please call her to schedule phone visit with me to discuss her cervical CT results.

## 2023-12-31 NOTE — Telephone Encounter (Signed)
 She scheduled telephone visit for 01/04/2024.

## 2024-01-01 NOTE — Progress Notes (Unsigned)
 Telephone Visit- Progress Note: Referring Physician:  No referring provider defined for this encounter.  Primary Physician:  Bluford Burkitt, NP  This visit was performed via telephone.  Patient location: home Provider location: office  I spent a total of 10 minutes non-face-to-face activities for this visit on the date of this encounter including review of current clinical condition and response to treatment.    Patient has given verbal consent to this telephone visits and we reviewed the limitations of a telephone visit. Patient wishes to proceed.    Chief Complaint:  review imaging  History of Present Illness: Anita Gibbs is a 53 y.o. female has a history of HTN, asthma, GERD, chronic pain syndrome, hyperlipidemia, obesity.    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 Mont Antis.   Last seen by me on 12/07/23 for neck and left arm pain. Saw Dr. Mont Antis back in 2020 for neck pain- he was not convinced she was fused at C6-C7. He recommended C6-C7 facet injections and she was lost to follow up.   Phone visit scheduled to review cervical xrays and CT scan.   She is about the same. She has constant neck pain radiating down left arm to the hand causing numbness and tingling. She notes more frequent headaches as ewll. She has weakness in both hands now. No right arm pain. Pain is worse with movement. Some relief with heating pad.    She has butrans  patch. She takes cymbalta , motrin, mobic , robaxin , and lyrica .    She does not smoke.    Conservative measures:  Physical therapy:  she in in PT now for her lower back Multimodal medical therapy including regular antiinflammatories:  Cymbalta , Methocarbamol , Lyrica , Meloxicam  Injections:  10/06/2022, 05/18/2023 L3-4 and L4-5 Radiofrequency Ablation 05/19/2022, 07/07/2022 L3-4, L4-5 Medial Branch Block   Past Surgery:  07/01/2017 C4-C7 ACDF by Dr. Mont Antis   The symptoms are causing a significant impact on the patient's life.     Exam: No exam done as this was a telephone encounter.     Imaging: Cervical xrays dated 12/14/23:  FINDINGS: Anterior fusion hardware with interbody device, surgical plate and fixating screws C4 through C7. Mild disc space narrowing and degenerative change at C3-C4 and C7-T1. No suspicious change in alignment with flexion or extension.   IMPRESSION: Anterior fusion hardware C4 through C7. Mild degenerative change at C3-C4 and C7-T1.     Electronically Signed   By: Esmeralda Hedge M.D.   On: 12/28/2023 19:30   CT scan of cervical spine dated 12/14/22:  FINDINGS: Alignment: Facet joints are aligned without dislocation or traumatic listhesis. Dens and lateral masses are aligned.   Skull base and vertebrae: Prior C4-C7 ACDF with solid arthrodesis. No evidence of hardware loosening. No fracture. No pathologic bone process.   Soft tissues and spinal canal: No prevertebral fluid or swelling. No visible canal hematoma.   Disc levels: Left paracentral endplate spurring at C4-5 resulting in mild canal stenosis. No evidence of high-grade foraminal or canal stenosis by CT.   Upper chest: Included lung apices are clear.   Other: None.   IMPRESSION: 1. Prior C4-C7 ACDF with solid arthrodesis. No evidence of hardware loosening. 2. Left paracentral endplate spurring at C4-5 resulting in mild canal stenosis. No evidence of high-grade foraminal or canal stenosis by CT.     Electronically Signed   By: Leverne Reading D.O.   On: 12/31/2023 11:29  I have personally reviewed the images and agree with the above interpretation.  Assessment and Plan: [name] is s/p C4-C7 ACDF on 07/01/17 by Dr. Mont Antis.    She has constant neck pain radiating down left arm to the hand causing numbness and tingling. She has weakness in both hands. No right arm pain.   Above CT shows that she is fused from C4-C7. Known mild DDD C3-C4 and C7-T1 on xrays.    At last visit, she had some left  arm weakness in the hand- she thought was chronic. Had positive hoffmans bilaterally and was hyer-reflexic- her previous ACDF was done for myelopathy. No dexterity issues. She has some balance issues that are chronic.    Treatment options discussed with patient and following plan made:   - MRI of cervical spine to evaluate cervical radiculopathy.  - EMG of bilateral upper extremities- orders to [facility] in [place].  - Will schedule f/u with me to review MRI/EMG results.    Lucetta Russel PA-C Neurosurgery

## 2024-01-04 ENCOUNTER — Encounter: Payer: Self-pay | Admitting: Orthopedic Surgery

## 2024-01-04 ENCOUNTER — Ambulatory Visit (INDEPENDENT_AMBULATORY_CARE_PROVIDER_SITE_OTHER): Admitting: Orthopedic Surgery

## 2024-01-04 ENCOUNTER — Encounter: Payer: Self-pay | Admitting: Neurology

## 2024-01-04 DIAGNOSIS — M5023 Other cervical disc displacement, cervicothoracic region: Secondary | ICD-10-CM | POA: Diagnosis not present

## 2024-01-04 DIAGNOSIS — Z981 Arthrodesis status: Secondary | ICD-10-CM

## 2024-01-04 DIAGNOSIS — M4722 Other spondylosis with radiculopathy, cervical region: Secondary | ICD-10-CM | POA: Diagnosis not present

## 2024-01-04 DIAGNOSIS — M542 Cervicalgia: Secondary | ICD-10-CM

## 2024-01-04 DIAGNOSIS — M5031 Other cervical disc degeneration,  high cervical region: Secondary | ICD-10-CM | POA: Diagnosis not present

## 2024-01-04 DIAGNOSIS — R200 Anesthesia of skin: Secondary | ICD-10-CM

## 2024-01-04 DIAGNOSIS — R29898 Other symptoms and signs involving the musculoskeletal system: Secondary | ICD-10-CM

## 2024-01-04 DIAGNOSIS — M5412 Radiculopathy, cervical region: Secondary | ICD-10-CM

## 2024-01-04 DIAGNOSIS — M47812 Spondylosis without myelopathy or radiculopathy, cervical region: Secondary | ICD-10-CM

## 2024-01-05 ENCOUNTER — Ambulatory Visit (INDEPENDENT_AMBULATORY_CARE_PROVIDER_SITE_OTHER): Admitting: Nurse Practitioner

## 2024-01-05 ENCOUNTER — Encounter: Payer: Self-pay | Admitting: Nurse Practitioner

## 2024-01-05 VITALS — BP 130/72 | HR 69 | Temp 98.3°F | Ht 63.0 in | Wt 242.4 lb

## 2024-01-05 DIAGNOSIS — M47812 Spondylosis without myelopathy or radiculopathy, cervical region: Secondary | ICD-10-CM | POA: Diagnosis not present

## 2024-01-05 DIAGNOSIS — F39 Unspecified mood [affective] disorder: Secondary | ICD-10-CM | POA: Diagnosis not present

## 2024-01-05 DIAGNOSIS — G44219 Episodic tension-type headache, not intractable: Secondary | ICD-10-CM

## 2024-01-05 DIAGNOSIS — G894 Chronic pain syndrome: Secondary | ICD-10-CM | POA: Diagnosis not present

## 2024-01-05 DIAGNOSIS — F410 Panic disorder [episodic paroxysmal anxiety] without agoraphobia: Secondary | ICD-10-CM

## 2024-01-05 DIAGNOSIS — I10 Essential (primary) hypertension: Secondary | ICD-10-CM

## 2024-01-05 DIAGNOSIS — M47819 Spondylosis without myelopathy or radiculopathy, site unspecified: Secondary | ICD-10-CM

## 2024-01-05 MED ORDER — BUTALBITAL-APAP-CAFFEINE 50-325-40 MG PO TABS
1.0000 | ORAL_TABLET | Freq: Four times a day (QID) | ORAL | 0 refills | Status: AC | PRN
Start: 2024-01-05 — End: ?

## 2024-01-05 NOTE — Progress Notes (Unsigned)
 Bluford Burkitt, NP-C Phone: [phone]  Anita Gibbs is a 53 y.o. female who presents today for follow up.   Discussed the use of AI scribe software for clinical note transcription with the patient, who gave verbal consent to proceed.  History of Present Illness   Anita Gibbs is a 53 year old female who presents with headaches.  She has been experiencing daily headaches for the past two weeks, characterized by a throbbing sensation. She suspects she may be related to sinus pressure, as her nose was notably red last week. She has been taking 3-4 Tylenol  daily without relief and has also tried ibuprofen 800 mg, but these have not been effective. She uses Flonase  and Zyrtec  for sinus issues.  She is currently undergoing treatment with rheumatology and has started infusions, which are now administered monthly. She reports that it will take time for the infusions to show effect. She has been seeing neurosurgery for cervical issues and has undergone imaging, a cervical MRI and EMG,are planned. Despite these interventions, she reports no improvement in pain control. Her current medications for pain include pregabalin  and ibuprofen 800 mg, which she uses as needed.  Her mental health has been challenging, describing it as 'out the window.' She continues to see a counselor and is scheduled to see a psychiatrist soon. She is currently taking hydroxyzine , Buspar , and Cymbalta  for her mental health.  She has been using a walker with wheels but feels the need for an upright walker with a seat to help with posture, as she feels 'slouched over.'      Social History   Tobacco Use  Smoking Status Never  Smokeless Tobacco Never    Current Outpatient Medications on File Prior to Visit  Medication Sig Dispense Refill   albuterol  (VENTOLIN  HFA) 108 (90 Base) MCG/ACT inhaler Inhale 2 puffs into the lungs every 6 (six) hours as needed for wheezing or shortness of breath. 8 g 11   amLODipine  (NORVASC ) 5  MG tablet Take 1 tablet (5 mg total) by mouth daily. 90 tablet 0   atorvastatin  (LIPITOR) 10 MG tablet MWF at night 90 tablet 3   buprenorphine  (BUTRANS ) 15 MCG/HR Place onto the skin once a week.     busPIRone  (BUSPAR ) 10 MG tablet Take 1 tablet (10 mg total) by mouth 2 (two) times daily. 60 tablet 1   cetirizine  (ZYRTEC  ALLERGY ) 10 MG tablet Take 1 tablet (10 mg total) by mouth at bedtime as needed. 90 tablet 3   Cholecalciferol (VITAMIN D -3) 125 MCG (5000 UT) TABS Take by mouth daily.     Cyanocobalamin  (B-12 PO) Take by mouth.     dicyclomine  (BENTYL ) 20 MG tablet Take 1 tablet (20 mg total) by mouth 4 (four) times daily -  before meals and at bedtime. 120 tablet 1   DULoxetine  (CYMBALTA ) 60 MG capsule Take 1 capsule (60 mg total) by mouth 2 (two) times daily. 60 capsule 2   EPINEPHrine  (AUVI-Q ) 0.3 mg/0.3 mL IJ SOAJ injection Use as directed for severe allergic reaction 2 Device 2   fluconazole  (DIFLUCAN ) 150 MG tablet Take 1 tablet by mouth x 1 dose. May repeat in 72 hours if needed. 2 tablet 0   fluticasone  (FLONASE ) 50 MCG/ACT nasal spray Place 2 sprays into both nostrils daily. 16 g 6   hydrocortisone  2.5 % cream Apply topically 2 (two) times daily. Prn to eyelids 30 g 0   hydrOXYzine  (VISTARIL ) 25 MG capsule Take 1 capsule (25 mg total) by mouth  3 (three) times daily. 90 capsule 2   ibuprofen (ADVIL) 800 MG tablet Take 800 mg by mouth 3 (three) times daily.     losartan  (COZAAR ) 100 MG tablet Take 1 tablet (100 mg total) by mouth daily. 90 tablet 3   meloxicam  (MOBIC ) 15 MG tablet Take 1 tablet (15 mg total) by mouth every other day as needed. 45 tablet 11   methocarbamol  (ROBAXIN ) 750 MG tablet Take 1 tablet (750 mg total) by mouth every 8 (eight) hours as needed for muscle spasms. 90 tablet 11   montelukast  (SINGULAIR ) 10 MG tablet Take 1 tablet (10 mg total) by mouth at bedtime. 90 tablet 3   pantoprazole  (PROTONIX ) 40 MG tablet TAKE ONE TABLET EVERY DAY 30 MIN BEFORE DINNER 90 tablet  3   polyethylene glycol powder (GLYCOLAX /MIRALAX ) 17 GM/SCOOP powder Take 17 g by mouth 2 (two) times daily as needed. 3350 g 1   potassium chloride  SA (KLOR-CON  M) 20 MEQ tablet Take 1 tablet (20 mEq total) by mouth daily as needed. With lasix  30 tablet 11   pregabalin  (LYRICA ) 75 MG capsule Take 1 capsule (75 mg total) by mouth 3 (three) times daily. 90 capsule 11   saccharomyces boulardii (FLORASTOR) 250 MG capsule Take 1 capsule (250 mg total) by mouth daily. 90 capsule 0   triamcinolone  cream (KENALOG ) 0.1 % Apply 1 Application topically 2 (two) times daily. 30 g 0   No current facility-administered medications on file prior to visit.     ROS see history of present illness  Objective  Physical Exam Vitals:   01/05/24 1039  BP: 130/72  Pulse: 69  Temp: 98.3 F (36.8 C)  SpO2: 99%    BP Readings from Last 3 Encounters:  01/05/24 130/72  12/07/23 138/86  11/24/23 138/80   Wt Readings from Last 3 Encounters:  01/05/24 242 lb 6.4 oz (110 kg)  12/07/23 240 lb (108.9 kg)  11/24/23 244 lb 12.8 oz (111 kg)    Physical Exam Constitutional:      General: She is not in acute distress.    Appearance: Normal appearance.  HENT:     Head: Normocephalic.  Cardiovascular:     Rate and Rhythm: Normal rate and regular rhythm.     Heart sounds: Normal heart sounds.  Pulmonary:     Effort: Pulmonary effort is normal.     Breath sounds: Normal breath sounds.  Skin:    General: Skin is warm and dry.  Neurological:     General: No focal deficit present.     Mental Status: She is alert.     Gait: Gait abnormal (chronic. using cane.).  Psychiatric:        Mood and Affect: Mood normal.        Behavior: Behavior normal.      Assessment/Plan: Please see individual problem list.  Episodic tension-type headache, not intractable Assessment & Plan: She experiences daily headaches for two weeks, unresponsive to acetaminophen  and ibuprofen. Stress and sinus pressure may contribute.  Prescribed Fioricet for management. PDMP reviewed. Counseled on common side effects such as drowsiness.   Orders: -     Butalbital-APAP-Caffeine; Take 1 tablet by mouth every 6 (six) hours as needed for headache.  Dispense: 30 tablet; Refill: 0  Seronegative spondyloarthropathy Assessment & Plan: Recently started infusions with Rheumatology. She requires an upright walker with a seat for improved posture and mobility. Provided prescription for the walker. Follow up with physical med and rheumatology as scheduled.   Orders: -  For home use only DME 4 wheeled rolling walker with seat  Cervical spondylosis Assessment & Plan: MRI Cervical spine and upper extremity EMG pending. Follow up with Physical Med and Rehab as scheduled.    Chronic pain syndrome Assessment & Plan: Ongoing management with rheumatology and neurosurgery continues. Monthly infusions are in progress. Awaiting imaging and EMG for cervical and arm pain. Pain control is not yet achieved. Follow up with specialists as scheduled.    Mood disorder [facility]) Assessment & Plan: She faces significant stress and emotional distress. Continues counseling and has an upcoming psychiatry appointment. Current medications include hydroxyzine , buspirone , and duloxetine . Continue.    Essential hypertension Assessment & Plan: Blood pressure stable today in office. Managed with Losartan  100 mg and Norvasc  5 mg daily. Continue. We will continue to monitor.       Return in about 3 months (around 04/06/2024) for Follow up.   Bluford Burkitt, NP-C [facility]

## 2024-01-06 ENCOUNTER — Encounter: Payer: Self-pay | Admitting: Nurse Practitioner

## 2024-01-07 ENCOUNTER — Encounter: Payer: Self-pay | Admitting: Nurse Practitioner

## 2024-01-07 NOTE — Assessment & Plan Note (Signed)
 MRI Cervical spine and upper extremity EMG pending. Follow up with Physical Med and Rehab as scheduled.

## 2024-01-07 NOTE — Assessment & Plan Note (Signed)
 She experiences daily headaches for two weeks, unresponsive to acetaminophen  and ibuprofen. Stress and sinus pressure may contribute. Prescribed Fioricet for management. PDMP reviewed. Counseled on common side effects such as drowsiness.

## 2024-01-07 NOTE — Assessment & Plan Note (Signed)
 She faces significant stress and emotional distress. Continues counseling and has an upcoming psychiatry appointment. Current medications include hydroxyzine , buspirone , and duloxetine . Continue.

## 2024-01-07 NOTE — Assessment & Plan Note (Signed)
 Blood pressure stable today in office. Managed with Losartan  100 mg and Norvasc  5 mg daily. Continue. We will continue to monitor.

## 2024-01-07 NOTE — Assessment & Plan Note (Signed)
 Recently started infusions with Rheumatology. She requires an upright walker with a seat for improved posture and mobility. Provided prescription for the walker. Follow up with physical med and rheumatology as scheduled.

## 2024-01-07 NOTE — Assessment & Plan Note (Signed)
 Ongoing management with rheumatology and neurosurgery continues. Monthly infusions are in progress. Awaiting imaging and EMG for cervical and arm pain. Pain control is not yet achieved. Follow up with specialists as scheduled.

## 2024-01-11 DIAGNOSIS — R262 Difficulty in walking, not elsewhere classified: Secondary | ICD-10-CM | POA: Diagnosis not present

## 2024-01-11 DIAGNOSIS — Z419 Encounter for procedure for purposes other than remedying health state, unspecified: Secondary | ICD-10-CM | POA: Diagnosis not present

## 2024-01-11 DIAGNOSIS — M47816 Spondylosis without myelopathy or radiculopathy, lumbar region: Secondary | ICD-10-CM | POA: Diagnosis not present

## 2024-01-11 DIAGNOSIS — M5459 Other low back pain: Secondary | ICD-10-CM | POA: Diagnosis not present

## 2024-01-11 DIAGNOSIS — G8929 Other chronic pain: Secondary | ICD-10-CM | POA: Diagnosis not present

## 2024-01-11 NOTE — Progress Notes (Unsigned)
 PROVIDER NOTE: Interpretation of information contained herein should be left to medically-trained personnel. Specific patient instructions are provided elsewhere under "Patient Instructions" section of medical record. This document was created in part using AI and STT-dictation technology, any transcriptional errors that may result from this process are unintentional.  Patient: Anita Gibbs  Service: E/M   PCP: Bluford Burkitt, NP  DOB: [birth year]  DOS: 01/12/2024  Provider: Cherylin Corrigan, NP  MRN: [id number]  Delivery: Face-to-face  Specialty: Interventional Pain Management  Type: Established Patient  Setting: Ambulatory outpatient facility  Specialty designation: 09  Referring Prov.: Bluford Burkitt, NP  Location: Outpatient office facility       HPI  Anita Gibbs, a 53 y.o. year old female, is here today because of her Lumbar facet joint syndrome [M47.816]. Ms. Orrico primary complain today is Back Pain (lower)   Pain Assessment: Severity of Chronic pain is reported as a 10-Worst pain ever/10. Location: Back  /pain radiaties everywhere. Onset: More than a month ago. Quality: Aching, Burning, Constant, Throbbing, Stabbing, Shooting, Sharp, Discomfort. Timing: Constant. Modifying factor(s): nothing. Vitals:  height is 5\' 3"  (1.6 m) and weight is 242 lb (109.8 kg). Her temperature is 97.4 F (36.3 C) (abnormal). Her blood pressure is 151/78 (abnormal) and her pulse is 59 (abnormal). Her oxygen saturation is 100%.  BMI: Estimated body mass index is 42.87 kg/m as calculated from the following:   Height as of this encounter: 5\' 3"  (1.6 m).   Weight as of this encounter: 242 lb (109.8 kg). Last encounter: 12/21/2023.  Reason for encounter: medication management. No change in medical history since last visit.  Patient's pain is at baseline.  Patient continues multimodal pain regimen as prescribed.  States that it provides pain relief and improvement in functional status.  Patient reports low back  pain, right side greater than left (R>L).   Of note, the patient underwent therapeutic and palliative right radiofrequency ablation (RFA) #1 on 10/29/2022 that provided her with 70% pain relief over 12 months.  She is experiencing return of her pain.  Given the reoccurrence of the low back pain on her right side, we discussed repeating the RFA.   Pharmacotherapy Assessment  Analgesic: Butrans  patch 15 mcg/hr 1 patch onto the skin once a week  Methocarbamol  (Robaxin ) 750 mg tablet every 8 hours as needed for muscle spasm Monitoring:  PMP: PDMP reviewed during this encounter.       Pharmacotherapy: No side-effects or adverse reactions reported. Compliance: No problems identified. Effectiveness: Clinically acceptable.  Kathee Palm, RN  01/12/2024  8:23 AM  Sign when Signing Visit Nursing Pain Medication Assessment:  Safety precautions to be maintained throughout the outpatient stay will include: orient to surroundings, keep bed in low position, maintain call bell within reach at all times, provide assistance with transfer out of bed and ambulation.  Medication Inspection Compliance: Pill count conducted under aseptic conditions, in front of the patient. Neither the pills nor the bottle was removed from the patient's sight at any time. Once count was completed pills were immediately returned to the patient in their original bottle.  Medication: Buprenorphine  patch (Butrans ) Pill/Patch Count: 0 of 4 pills/patches remain Pill/Patch Appearance: Markings consistent with prescribed medication Bottle Appearance: Standard pharmacy container. Clearly labeled. Filled Date: 01/ 20/ 2025 Last Medication intake:  TodaySafety precautions to be maintained throughout the outpatient stay will include: orient to surroundings, keep bed in low position, maintain call bell within reach at all times, provide assistance with transfer  out of bed and ambulation.       No results found for: "CBDTHCR" No results  found for: "D8THCCBX" No results found for: "D9THCCBX"  UDS:  Summary  Date Value Ref Range Status  02/01/2020 Note  Final    Comment:    ==================================================================== [facility] Select 13 (MW) ==================================================================== Test                             Result       Flag       Units Drug Present not Declared for Prescription Verification   Carboxy-THC                    20           UNEXPECTED ng/mg creat    Carboxy-THC is a metabolite of tetrahydrocannabinol (THC). Source of    THC is most commonly herbal marijuana or marijuana-based products,    but THC is also present in a scheduled prescription medication.    Trace amounts of THC can be present in hemp and cannabidiol (CBD)    products. This test is not intended to distinguish between delta-9-    tetrahydrocannabinol, the predominant form of THC in most herbal or    marijuana-based products, and delta-8-tetrahydrocannabinol. Drug Absent but Declared for Prescription Verification   Oxycodone                       Not Detected UNEXPECTED ng/mg creat ==================================================================== Test                      Result    Flag   Units      Ref Range   Creatinine              230              mg/dL      >=51 ==================================================================== Declared Medications:  The flagging and interpretation on this report are based on the  following declared medications.  Unexpected results may arise from  inaccuracies in the declared medications.  **Note: The testing scope of this panel includes these medications:  Oxycodone  (Roxicodone )  **Note: The testing scope of this panel does not include the  following reported medications:  Albuterol  (Ventolin  HFA)  Amlodipine  (Norvasc )  Atorvastatin  (Lipitor)  Cetirizine   Diphenhydramine (Benadryl)  Epinephrine  (EpiPen )  Fluticasone  (Flonase )   Gabapentin  (Neurontin )  Hydrochlorothiazide   Losartan  (Cozaar )  Meloxicam  (Mobic )  Methocarbamol  (Robaxin )  Montelukast  (Singulair )  Norethindrone (Aygestin)  Pantoprazole  (Protonix )  Vitamin D3 ==================================================================== For clinical consultation, please call [phone]. ====================================================================       ROS  Constitutional: Denies any fever or chills Gastrointestinal: No reported hemesis, hematochezia, vomiting, or acute GI distress Musculoskeletal: Low back pain (R>L) Neurological: No reported episodes of acute onset apraxia, aphasia, dysarthria, agnosia, amnesia, paralysis, loss of coordination, or loss of consciousness  Medication Review  Cyanocobalamin , DULoxetine , EPINEPHrine , Vitamin D -3, albuterol , amLODipine , atorvastatin , buprenorphine , busPIRone , butalbital -acetaminophen -caffeine , cetirizine , dicyclomine , fluconazole , fluticasone , hydrOXYzine , hydrocortisone , ibuprofen, losartan , meloxicam , methocarbamol , montelukast , pantoprazole , polyethylene glycol powder, potassium chloride  SA, pregabalin , saccharomyces boulardii, and triamcinolone  cream  History Review  Allergy : Ms. Pallanes is allergic to other, pineapple, shellfish allergy , strawberry (diagnostic), and tomato. Drug: Ms. Buick  reports no history of drug use. Alcohol:  reports current alcohol use of about 2.0 standard drinks of alcohol per week. Tobacco:  reports that she has never smoked. She has never used smokeless  tobacco. Social: Ms. Gleaton  reports that she has never smoked. She has never used smokeless tobacco. She reports current alcohol use of about 2.0 standard drinks of alcohol per week. She reports that she does not use drugs. 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), Adrenal nodule (HCC) (06/20/2020), Allergy , ANA positive (02/01/2021), Anxiety and depression (01/29/2021), Arthritis, Back pain with history of spinal surgery (01/29/2021), Bilateral carpal tunnel syndrome (01/29/2021), Bilateral leg edema (01/29/2021), BMI 40.0-44.9, adult (HCC) (04/30/2022), Carpal boss, right, Carpal tunnel syndrome, bilateral, 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) (09/27/2018), Cervical facet hypertrophy (C3-T1) (09/27/2018), Cervical facet joint syndrome (Bilateral) (L>R) (09/27/2018), 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-5, C5-6, and C6-7) (09/27/2018), Cervical myelopathy (HCC) (07/01/2017), Cervicalgia (Primary Area of Pain) (Bilateral) (L>R) (09/27/2018), Chronic gout of foot (Left) (05/31/2018), Chronic low back pain (Tertiary Area of Pain) (Bilateral) (R>L) w/ sciatica (Bilateral) (09/07/2018), Chronic low back pain (Tertiary Area of Pain) (Bilateral) (R>L) w/o sciatica (07/26/2018), Chronic lower extremity pain (Fourth Area of Pain) (Bilateral) (R>L) (09/07/2018), Chronic musculoskeletal pain (10/27/2018), Chronic neck pain (Bilateral) w/ history of cervical spinal surgery (09/27/2018), Chronic neck pain (Primary Area of Pain) (Bilateral) (L>R) (09/07/2018), Chronic sacroiliac joint dysfunction (Bilateral) (09/27/2018), Chronic sacroiliac joint pain (Right) (09/07/2018), Chronic upper extremity pain (Secondary Area of Pain) (Bilateral) (R>L) (09/27/2018), Disorder of skeletal system (09/07/2018), E. coli UTI (urinary tract infection) (06/20/2020), Elevated sed rate (09/08/2018), Elevated serum creatinine (06/20/2020), Excessive daytime sleepiness (03/06/2020), Female pelvic pain (09/05/2020), GERD (gastroesophageal reflux disease), Headache, History of allergy  to shellfish (09/28/2018), History of fusion of cervical spine (ACDF C4-C7) (09/27/2018), History of illicit drug use  (03/07/2020), Hives (09/05/2020), Hives (09/05/2020), Hyperkalemia (05/31/2018), Hyperlipidemia (04/30/2022), Hypertension, Large breasts (05/30/2020), Left hip pain (07/21/2022), Lumbar facet arthropathy (04/20/2019), Lumbar radiculitis (L5 dermatome) (Right) (04/17/2020), Multiple environmental allergies (01/17/2020), Multiple food allergies (03/07/2020), Neurogenic pain (10/27/2018), Numbness and tingling of both feet (05/31/2018), 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), Other intervertebral disc degeneration, 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), Pharmacologic therapy (09/07/2018), Plantar fasciitis, bilateral (03/04/2021), Polyneuropathy (01/29/2021), Prediabetes (10/10/2019), Problems influencing health status (09/07/2018), Rash (08/08/2019), Seasonal allergic rhinitis due to pollen (11/29/2018), Seasonal allergies (03/06/2020), Seasonal asthma (11/29/2018), Sleep apnea, Somatic dysfunction of sacroiliac joint (Bilateral) (09/27/2018), Spondylosis without myelopathy or radiculopathy, cervical region (03/22/2019), Spondylosis without myelopathy or radiculopathy, lumbosacral region (05/19/2019), Spondylosis, cervical, 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 Surgical: Ms. Kofler  has a past surgical history that includes Knee arthroscopy (Right); Anterior cervical decomp/discectomy fusion (N/A, 07/01/2017); Abdominal hysterectomy (10/17/2020); and Carpal tunnel release (Bilateral). Family: family history includes [name] in her mother; Arthritis in her father, mother, and sister; Breast cancer in her maternal aunt and paternal aunt; COPD in her brother and son; Cancer in her brother; Depression in her son; Diabetes in her mother; Early death in her brother; Hearing loss in her father; Hyperlipidemia in her mother; Hypertension in her father, mother, and sister; Kidney disease in her father; Lymphoma in her father; Sleep apnea in her daughter.  Laboratory Chemistry Profile    Renal Lab Results  Component Value Date   BUN 15 11/18/2023   CREATININE 0.75 11/18/2023   BCR NOT APPLICABLE 02/13/2021   GFR 63.48 11/09/2023   GFRAA 76 09/07/2018   GFRNONAA >60 11/18/2023    Hepatic Lab Results  Component Value Date   AST 12 (L) 11/18/2023   ALT 12 11/18/2023  ALBUMIN 3.9 11/18/2023   ALKPHOS 45 11/18/2023   LIPASE 32.0 06/30/2022    Electrolytes Lab Results  Component Value Date   NA 138 11/18/2023   K 3.6 11/18/2023   CL 105 11/18/2023   CALCIUM  8.8 (L) 11/18/2023   MG 2.2 09/07/2018    Bone Lab Results  Component Value Date   VD25OH 29.35 (L) 04/08/2023   25OHVITD1 8.1 (L) 09/07/2018   25OHVITD2 <1.0 09/07/2018   25OHVITD3 8.1 09/07/2018    Inflammation (CRP: Acute Phase) (ESR: Chronic Phase) Lab Results  Component Value Date   CRP <1.0 07/22/2023   ESRSEDRATE 57 (H) 07/22/2023         Note: Above Lab results reviewed.  Recent Imaging Review  CT CERVICAL SPINE WO CONTRAST CLINICAL DATA:  Chronic neck pain  EXAM: CT CERVICAL SPINE WITHOUT CONTRAST  TECHNIQUE: Multidetector CT imaging of the cervical spine was performed without intravenous contrast. Multiplanar CT image reconstructions were also generated.  RADIATION DOSE REDUCTION: This exam was performed according to the departmental dose-optimization program which includes automated exposure control, adjustment of the mA and/or kV according to patient size and/or use of iterative reconstruction technique.  COMPARISON:  01/02/2021  FINDINGS: Alignment: Facet joints are aligned without dislocation or traumatic listhesis. Dens and lateral masses are aligned.  Skull base and vertebrae: Prior C4-C7 ACDF with solid arthrodesis. No evidence of hardware loosening. No fracture. No pathologic bone process.  Soft tissues and spinal canal: No prevertebral fluid or swelling. No visible canal hematoma.  Disc levels: Left paracentral endplate spurring at C4-5 resulting in mild  canal stenosis. No evidence of high-grade foraminal or canal stenosis by CT.  Upper chest: Included lung apices are clear.  Other: None.  IMPRESSION: 1. Prior C4-C7 ACDF with solid arthrodesis. No evidence of hardware loosening. 2. Left paracentral endplate spurring at C4-5 resulting in mild canal stenosis. No evidence of high-grade foraminal or canal stenosis by CT.  Electronically Signed   By: Leverne Reading D.O.   On: 12/31/2023 11:29 Note: Reviewed         Physical Exam  General appearance: Well nourished, well developed, and well hydrated. In no apparent acute distress Mental status: Alert, oriented x 3 (person, place, & time)       Respiratory: No evidence of acute respiratory distress Eyes: PERLA Vitals: BP (!) 151/78   Pulse (!) 59   Temp (!) 97.4 F (36.3 C)   Ht 5\' 3"  (1.6 m)   Wt 242 lb (109.8 kg)   LMP  (LMP Unknown)   SpO2 100%   BMI 42.87 kg/m  BMI: Estimated body mass index is 42.87 kg/m as calculated from the following:   Height as of this encounter: 5\' 3"  (1.6 m).   Weight as of this encounter: 242 lb (109.8 kg). Ideal: Ideal body weight: 52.4 kg (115 lb 8.3 oz) Adjusted ideal body weight: 75.3 kg (166 lb 1.8 oz)  Musculoskeletal: Low back pain on right side worse with standing, sitting and bending   Assessment   Diagnosis Status  1. Lumbar facet syndrome (Bilateral) (R>L)   2. Lumbar facet arthropathy   3. Chronic low back pain [facility] Area of Pain) (Bilateral) (R>L) w/ sciatica (Bilateral)   4. Chronic pain syndrome   5. Neurogenic pain   6. Lumbar radiculitis (L5 dermatome)    7. Chronic musculoskeletal pain    Having a Flare-up Having a Flare-up Having a Flare-up   Plan of Care  Assessment and Plan We will continue on  Butrans  patch methocarbamol  (Robaxin ) for pain management.  Given the reoccurrence of the low back pain on her right side, we discussed repeating the RFA and patient would like to proceed with the plan.    Pharmacotherapy (Medications Ordered): Meds ordered this encounter  Medications   buprenorphine  (BUTRANS ) 15 MCG/HR    Sig: Place 1 patch onto the skin once a week.    Dispense:  4 patch    Refill:  2   methocarbamol  (ROBAXIN ) 750 MG tablet    Sig: Take 1 tablet (750 mg total) by mouth every 8 (eight) hours as needed for muscle spasms.    Dispense:  90 tablet    Refill:  11    Fill one day early if pharmacy is closed on scheduled refill date. May substitute for generic if available.   Orders:  Orders Placed This Encounter  Procedures   Radiofrequency,Lumbar    Standing Status:   Future    Expected Date:   02/01/2024    Expiration Date:   04/13/2024    Scheduling Instructions:     Side(s): RIGHT     Level(s): L3, L4, L5,  Medial Branch Nerve(s)     Sedation: With Sedation     Scheduling Timeframe: As soon as pre-approved    Where will this procedure be performed?:   [facility]   Follow-up plan:   Return in about 3 months (around 04/13/2024) for (F2F), (MM), Marthe Slain NP.    Recent Visits No visits were found meeting these conditions. Showing recent visits within past 90 days and meeting all other requirements Today's Visits Date Type Provider Dept  01/12/24 Office Visit Kiarra Kidd K, NP [facility]  Showing today's visits and meeting all other requirements Future Appointments No visits were found meeting these conditions. Showing future appointments within next 90 days and meeting all other requirements  I discussed the assessment and treatment plan with the patient. The patient was provided an opportunity to ask questions and all were answered. The patient agreed with the plan and demonstrated an understanding of the instructions.  Patient advised to call back or seek an in-person evaluation if the symptoms or condition worsens.  Duration of encounter: 30 minutes.  Total time on encounter, as per AMA guidelines included both the face-to-face and  non-face-to-face time personally spent by the physician and/or other qualified health care professional(s) on the day of the encounter (includes time in activities that require the physician or other qualified health care professional and does not include time in activities normally performed by clinical staff). Physician's time may include the following activities when performed: Preparing to see the patient (e.g., pre-charting review of records, searching for previously ordered imaging, lab work, and nerve conduction tests) Review of prior analgesic pharmacotherapies. Reviewing PMP Interpreting ordered tests (e.g., lab work, imaging, nerve conduction tests) Performing post-procedure evaluations, including interpretation of diagnostic procedures Obtaining and/or reviewing separately obtained history Performing a medically appropriate examination and/or evaluation Counseling and educating the patient/family/caregiver Ordering medications, tests, or procedures Referring and communicating with other health care professionals (when not separately reported) Documenting clinical information in the electronic or other health record Independently interpreting results (not separately reported) and communicating results to the patient/ family/caregiver Care coordination (not separately reported)  Note by: Sheldon Sem K Anida Deol, NP (TTS and AI technology used. I apologize for any typographical errors that were not detected and corrected.) Date: 01/12/2024; Time: 9:11 AM

## 2024-01-12 ENCOUNTER — Encounter: Payer: Self-pay | Admitting: Nurse Practitioner

## 2024-01-12 ENCOUNTER — Ambulatory Visit (HOSPITAL_BASED_OUTPATIENT_CLINIC_OR_DEPARTMENT_OTHER): Payer: 59 | Admitting: Nurse Practitioner

## 2024-01-12 ENCOUNTER — Ambulatory Visit
Admission: RE | Admit: 2024-01-12 | Discharge: 2024-01-12 | Disposition: A | Discharge status: Home / Self Care | Source: Ambulatory Visit | Attending: Orthopedic Surgery | Admitting: Orthopedic Surgery

## 2024-01-12 VITALS — BP 151/78 | HR 59 | Temp 97.4°F | Ht 63.0 in | Wt 242.0 lb

## 2024-01-12 DIAGNOSIS — M47816 Spondylosis without myelopathy or radiculopathy, lumbar region: Secondary | ICD-10-CM | POA: Diagnosis not present

## 2024-01-12 DIAGNOSIS — R200 Anesthesia of skin: Secondary | ICD-10-CM | POA: Diagnosis not present

## 2024-01-12 DIAGNOSIS — M47812 Spondylosis without myelopathy or radiculopathy, cervical region: Secondary | ICD-10-CM | POA: Insufficient documentation

## 2024-01-12 DIAGNOSIS — M792 Neuralgia and neuritis, unspecified: Secondary | ICD-10-CM

## 2024-01-12 DIAGNOSIS — R29898 Other symptoms and signs involving the musculoskeletal system: Secondary | ICD-10-CM | POA: Diagnosis not present

## 2024-01-12 DIAGNOSIS — R202 Paresthesia of skin: Secondary | ICD-10-CM | POA: Insufficient documentation

## 2024-01-12 DIAGNOSIS — M4722 Other spondylosis with radiculopathy, cervical region: Secondary | ICD-10-CM | POA: Insufficient documentation

## 2024-01-12 DIAGNOSIS — M5416 Radiculopathy, lumbar region: Secondary | ICD-10-CM | POA: Insufficient documentation

## 2024-01-12 DIAGNOSIS — M5442 Lumbago with sciatica, left side: Secondary | ICD-10-CM | POA: Insufficient documentation

## 2024-01-12 DIAGNOSIS — M5441 Lumbago with sciatica, right side: Secondary | ICD-10-CM

## 2024-01-12 DIAGNOSIS — G894 Chronic pain syndrome: Secondary | ICD-10-CM

## 2024-01-12 DIAGNOSIS — M7918 Myalgia, other site: Secondary | ICD-10-CM

## 2024-01-12 DIAGNOSIS — G8929 Other chronic pain: Secondary | ICD-10-CM | POA: Insufficient documentation

## 2024-01-12 DIAGNOSIS — M4726 Other spondylosis with radiculopathy, lumbar region: Secondary | ICD-10-CM | POA: Diagnosis not present

## 2024-01-12 DIAGNOSIS — M5412 Radiculopathy, cervical region: Secondary | ICD-10-CM | POA: Insufficient documentation

## 2024-01-12 DIAGNOSIS — Z981 Arthrodesis status: Secondary | ICD-10-CM | POA: Diagnosis not present

## 2024-01-12 DIAGNOSIS — M542 Cervicalgia: Secondary | ICD-10-CM | POA: Insufficient documentation

## 2024-01-12 MED ORDER — METHOCARBAMOL 750 MG PO TABS
750.0000 mg | ORAL_TABLET | Freq: Three times a day (TID) | ORAL | 11 refills | Status: DC | PRN
Start: 2024-01-12 — End: 2024-04-07

## 2024-01-12 MED ORDER — BUPRENORPHINE 15 MCG/HR TD PTWK: 1.0000 | MEDICATED_PATCH | TRANSDERMAL | 2 refills | Status: DC

## 2024-01-12 NOTE — Patient Instructions (Signed)
 ______________________________________________________________________    General Risks and Possible Complications  Patient Responsibilities: It is important that you read this as it is part of your informed consent. It is our duty to inform you of the risks and possible complications associated with treatments offered to you. It is your responsibility as a patient to read this and to ask questions about anything that is not clear or that you believe was not covered in this document.  Patient's Rights: You have the right to refuse treatment. You also have the right to change your mind, even after initially having agreed to have the treatment done. However, under this last option, if you wait until the last second to change your mind, you may be charged for the materials used up to that point.  Introduction: Medicine is not an Visual merchandiser. Everything in Medicine, including the lack of treatment(s), carries the potential for danger, harm, or loss (which is by definition: Risk). In Medicine, a complication is a secondary problem, condition, or disease that can aggravate an already existing one. All treatments carry the risk of possible complications. The fact that a side effects or complications occurs, does not imply that the treatment was conducted incorrectly. It must be clearly understood that these can happen even when everything is done following the highest safety standards.  No treatment: You can choose not to proceed with the proposed treatment alternative. The "PRO(s)" would include: avoiding the risk of complications associated with the therapy. The "CON(s)" would include: not getting any of the treatment benefits. These benefits fall under one of three categories: diagnostic; therapeutic; and/or palliative. Diagnostic benefits include: getting information which can ultimately lead to improvement of the disease or symptom(s). Therapeutic benefits are those associated with the successful  treatment of the disease. Finally, palliative benefits are those related to the decrease of the primary symptoms, without necessarily curing the condition (example: decreasing the pain from a flare-up of a chronic condition, such as incurable terminal cancer).  General Risks and Complications: These are associated to most interventional treatments. They can occur alone, or in combination. They fall under one of the following six (6) categories: no benefit or worsening of symptoms; bleeding; infection; nerve damage; allergic reactions; and/or death. No benefits or worsening of symptoms: In Medicine there are no guarantees, only probabilities. No healthcare provider can ever guarantee that a medical treatment will work, they can only state the probability that it may. Furthermore, there is always the possibility that the condition may worsen, either directly, or indirectly, as a consequence of the treatment. Bleeding: This is more common if the patient is taking a blood thinner, either prescription or over the counter (example: Goody Powders, Fish oil, Aspirin, Garlic, etc.), or if suffering a condition associated with impaired coagulation (example: Hemophilia, cirrhosis of the liver, low platelet counts, etc.). However, even if you do not have one on these, it can still happen. If you have any of these conditions, or take one of these drugs, make sure to notify your treating physician. Infection: This is more common in patients with a compromised immune system, either due to disease (example: diabetes, cancer, human immunodeficiency virus [HIV], etc.), or due to medications or treatments (example: therapies used to treat cancer and rheumatological diseases). However, even if you do not have one on these, it can still happen. If you have any of these conditions, or take one of these drugs, make sure to notify your treating physician. Nerve Damage: This is more common when the treatment is  an invasive one, but it  can also happen with the use of medications, such as those used in the treatment of cancer. The damage can occur to small secondary nerves, or to large primary ones, such as those in the spinal cord and brain. This damage may be temporary or permanent and it may lead to impairments that can range from temporary numbness to permanent paralysis and/or brain death. Allergic Reactions: Any time a substance or material comes in contact with our body, there is the possibility of an allergic reaction. These can range from a mild skin rash (contact dermatitis) to a severe systemic reaction (anaphylactic reaction), which can result in death. Death: In general, any medical intervention can result in death, most of the time due to an unforeseen complication. ______________________________________________________________________      ______________________________________________________________________    Preparing for your procedure  Appointments: If you think you may not be able to keep your appointment, call 24-48 hours in advance to cancel. We need time to make it available to others.  Procedure visits are for procedures only. During your procedure appointment there will be: NO Prescription Refills*. NO medication changes or discussions*. NO discussion of disability issues*. NO unrelated pain problem evaluations*. NO evaluations to order other pain procedures*. *These will be addressed at a separate and distinct evaluation encounter on the provider's evaluation schedule and not during procedure days.  Instructions: Food intake: Avoid eating anything solid for at least 8 hours prior to your procedure. Clear liquid intake: You may take clear liquids such as water up to 2 hours prior to your procedure. (No carbonated drinks. No soda.) Transportation: Unless otherwise stated by your physician, bring a driver. (Driver cannot be a Market researcher, Pharmacist, community, or any other form of public transportation.) Morning  Medicines: Except for blood thinners, take all of your other morning medications with a sip of water. Make sure to take your heart and blood pressure medicines. If your blood pressure's lower number is above 100, the case will be rescheduled. Blood thinners: Make sure to stop your blood thinners as instructed.  If you take a blood thinner, but were not instructed to stop it, call our office [phone] and ask to talk to a nurse. Not stopping a blood thinner prior to certain procedures could lead to serious complications. Diabetics on insulin: Notify the staff so that you can be scheduled 1st case in the morning. If your diabetes requires high dose insulin, take only  of your normal insulin dose the morning of the procedure and notify the staff that you have done so. Preventing infections: Shower with an antibacterial soap the morning of your procedure.  Build-up your immune system: Take 1000 mg of Vitamin C with every meal (3 times a day) the day prior to your procedure. Antibiotics: Inform the nursing staff if you are taking any antibiotics or if you have any conditions that may require antibiotics prior to procedures. (Example: recent joint implants)   Pregnancy: If you are pregnant make sure to notify the nursing staff. Not doing so may result in injury to the fetus, including death.  Sickness: If you have a cold, fever, or any active infections, call and cancel or reschedule your procedure. Receiving steroids while having an infection may result in complications. Arrival: You must be in the facility at least 30 minutes prior to your scheduled procedure. Tardiness: Your scheduled time is also the cutoff time. If you do not arrive at least 15 minutes prior to your procedure, you will  be rescheduled.  Children: Do not bring any children with you. Make arrangements to keep them home. Dress appropriately: There is always a possibility that your clothing may get soiled. Avoid long dresses. Valuables:  Do not bring any jewelry or valuables.  Reasons to call and reschedule or cancel your procedure: (Following these recommendations will minimize the risk of a serious complication.) Surgeries: Avoid having procedures within 2 weeks of any surgery. (Avoid for 2 weeks before or after any surgery). Flu Shots: Avoid having procedures within 2 weeks of a flu shots or . (Avoid for 2 weeks before or after immunizations). Barium: Avoid having a procedure within 7-10 days after having had a radiological study involving the use of radiological contrast. (Myelograms, Barium swallow or enema study). Heart attacks: Avoid any elective procedures or surgeries for the initial 6 months after a "Myocardial Infarction" (Heart Attack). Blood thinners: It is imperative that you stop these medications before procedures. Let us know if you if you take any blood thinner.  Infection: Avoid procedures during or within two weeks of an infection (including chest colds or gastrointestinal problems). Symptoms associated with infections include: Localized redness, fever, chills, night sweats or profuse sweating, burning sensation when voiding, cough, congestion, stuffiness, runny nose, sore throat, diarrhea, nausea, vomiting, cold or Flu symptoms, recent or current infections. It is specially important if the infection is over the area that we intend to treat. Heart and lung problems: Symptoms that may suggest an active cardiopulmonary problem include: cough, chest pain, breathing difficulties or shortness of breath, dizziness, ankle swelling, uncontrolled high or unusually low blood pressure, and/or palpitations. If you are experiencing any of these symptoms, cancel your procedure and contact your primary care physician for an evaluation.  Remember:  Regular Business hours are:  Monday to Thursday 8:00 AM to 4:00 PM  Provider's Schedule: Delano Metz, MD:  Procedure days: Tuesday and Thursday 7:30 AM to 4:00 PM  Edward Jolly, MD:  Procedure days: Monday and Wednesday 7:30 AM to 4:00 PM Last  Updated: 08/11/2023 ______________________________________________________________________     Radiofrequency Ablation Radiofrequency ablation is a procedure that is performed to relieve pain. The procedure is often used for back, neck, or arm pain. Radiofrequency ablation involves the use of a machine that creates radio waves to make heat. During the procedure, the heat is applied to the nerve that carries the pain signal. The heat damages the nerve and interferes with the pain signal. Pain relief usually starts about 2 weeks after the procedure and lasts for 6 months to 1 year. Tell a health care provider about: Any allergies you have. All medicines you are taking, including vitamins, herbs, eye drops, creams, and over-the-counter medicines. Any problems you or family members have had with anesthetic medicines. Any bleeding problems you have. Any surgeries you have had. Any medical conditions you have. Whether you are pregnant or may be pregnant. What are the risks? Generally, this is a safe procedure. However, problems may occur, including: Pain or soreness at the injection site. Allergic reaction to medicines given during the procedure. Bleeding. Infection at the injection site. Damage to nerves or blood vessels. What happens before the procedure? When to stop eating and drinking Follow instructions from your health care provider about what you may eat and drink before your procedure. These may include: 8 hours before the procedure Stop eating most foods. Do not eat meat, fried foods, or fatty foods. Eat only light foods, such as toast or crackers. All liquids are okay except  energy drinks and alcohol. 6 hours before the procedure Stop eating. Drink only clear liquids, such as water, clear fruit juice, black coffee, plain tea, and sports drinks. Do not drink energy drinks or alcohol. 2 hours before the  procedure Stop drinking all liquids. You may be allowed to take medicine with small sips of water. If you do not follow your health care provider's instructions, your procedure may be delayed or canceled. Medicines Ask your health care provider about: Changing or stopping your regular medicines. This is especially important if you are taking diabetes medicines or blood thinners. Taking medicines such as aspirin and ibuprofen. These medicines can thin your blood. Do not take these medicines unless your health care provider tells you to take them. Taking over-the-counter medicines, vitamins, herbs, and supplements. General instructions Ask your health care provider what steps will be taken to help prevent infection. These steps may include: Removing hair at the procedure site. Washing skin with a germ-killing soap. Taking antibiotic medicine. If you will be going home right after the procedure, plan to have a responsible adult: Take you home from the hospital or clinic. You will not be allowed to drive. Care for you for the time you are told. What happens during the procedure?  You will be awake during the procedure. You will need to be able to talk with the health care provider during the procedure. An IV will be inserted into one of your veins. You will be given one or more of the following: A medicine to help you relax (sedative). A medicine to numb the area (local anesthetic). Your health care provider will insert a radiofrequency needle into the area to be treated. This is done with the help of fluoroscopy. A wire that carries the radio waves (electrode) will be put through the radiofrequency needle. An electrical pulse will be sent through the electrode to verify the correct nerve that is causing your pain. You will feel a tingling sensation, and you may have muscle twitching. The tissue around the needle tip will be heated by an electric current that comes from the radiofrequency  machine. This will numb the nerves. The needle will be removed. A bandage (dressing) will be put on the insertion area. The procedure may vary among health care providers and hospitals. What happens after the procedure? Your blood pressure, heart rate, breathing rate, and blood oxygen level will be monitored until you leave the hospital or clinic. Return to your normal activities as told by your health care provider. Ask your health care provider what activities are safe for you. If you were given a sedative during the procedure, it can affect you for several hours. Do not drive or operate machinery until your health care provider says that it is safe. Summary Radiofrequency ablation is a procedure that is performed to relieve pain. The procedure is often used for back, neck, or arm pain. Radiofrequency ablation involves the use of a machine that creates radio waves to make heat. Plan to have a responsible adult take you home from the hospital or clinic. Do not drive or operate machinery until your health care provider says that it is safe. Return to your normal activities as told by your health care provider. Ask your health care provider what activities are safe for you. This information is not intended to replace advice given to you by your health care provider. Make sure you discuss any questions you have with your health care provider. Document Revised: 02/05/2021 Document Reviewed: 02/05/2021  Elsevier Patient Education  2024 [employer].

## 2024-01-12 NOTE — Progress Notes (Signed)
 Nursing Pain Medication Assessment:  Safety precautions to be maintained throughout the outpatient stay will include: orient to surroundings, keep bed in low position, maintain call bell within reach at all times, provide assistance with transfer out of bed and ambulation.  Medication Inspection Compliance: Pill count conducted under aseptic conditions, in front of the patient. Neither the pills nor the bottle was removed from the patient's sight at any time. Once count was completed pills were immediately returned to the patient in their original bottle.  Medication: Buprenorphine  patch (Butrans ) Pill/Patch Count: 0 of 4 pills/patches remain Pill/Patch Appearance: Markings consistent with prescribed medication Bottle Appearance: Standard pharmacy container. Clearly labeled. Filled Date: 01/ 20/ 2025 Last Medication intake:  TodaySafety precautions to be maintained throughout the outpatient stay will include: orient to surroundings, keep bed in low position, maintain call bell within reach at all times, provide assistance with transfer out of bed and ambulation.

## 2024-01-15 ENCOUNTER — Telehealth: Payer: Self-pay

## 2024-01-15 NOTE — Telephone Encounter (Signed)
 done

## 2024-01-15 NOTE — Telephone Encounter (Signed)
 [employer] called and left vm about her butran patches. They need clinical notes regarding any other patches or medications tried. And if not the reason why she cannot try them. Fax notes to [phone]

## 2024-01-18 ENCOUNTER — Ambulatory Visit: Admitting: Clinical

## 2024-01-18 ENCOUNTER — Ambulatory Visit: Admitting: Psychiatry

## 2024-01-18 ENCOUNTER — Other Ambulatory Visit: Payer: Self-pay

## 2024-01-18 ENCOUNTER — Encounter: Payer: Self-pay | Admitting: Psychiatry

## 2024-01-18 VITALS — BP 128/84 | HR 65 | Temp 97.8°F | Ht 63.0 in | Wt 250.4 lb

## 2024-01-18 DIAGNOSIS — M5416 Radiculopathy, lumbar region: Secondary | ICD-10-CM | POA: Diagnosis not present

## 2024-01-18 DIAGNOSIS — G4733 Obstructive sleep apnea (adult) (pediatric): Secondary | ICD-10-CM | POA: Insufficient documentation

## 2024-01-18 DIAGNOSIS — Z6841 Body Mass Index (BMI) 40.0 and over, adult: Secondary | ICD-10-CM | POA: Diagnosis not present

## 2024-01-18 DIAGNOSIS — F411 Generalized anxiety disorder: Secondary | ICD-10-CM

## 2024-01-18 DIAGNOSIS — G4730 Sleep apnea, unspecified: Secondary | ICD-10-CM | POA: Diagnosis not present

## 2024-01-18 DIAGNOSIS — G8929 Other chronic pain: Secondary | ICD-10-CM | POA: Diagnosis not present

## 2024-01-18 DIAGNOSIS — G959 Disease of spinal cord, unspecified: Secondary | ICD-10-CM | POA: Diagnosis not present

## 2024-01-18 DIAGNOSIS — F331 Major depressive disorder, recurrent, moderate: Secondary | ICD-10-CM | POA: Insufficient documentation

## 2024-01-18 DIAGNOSIS — M47816 Spondylosis without myelopathy or radiculopathy, lumbar region: Secondary | ICD-10-CM | POA: Diagnosis not present

## 2024-01-18 DIAGNOSIS — M5442 Lumbago with sciatica, left side: Secondary | ICD-10-CM | POA: Diagnosis not present

## 2024-01-18 MED ORDER — BUSPIRONE HCL 10 MG PO TABS: 20.0000 mg | ORAL_TABLET | Freq: Two times a day (BID) | ORAL | 1 refills | Status: DC

## 2024-01-18 NOTE — Progress Notes (Signed)
 Psychiatric Initial Adult Assessment   Patient Identification: Anita Gibbs MRN:  [id number] Date of Evaluation:  01/18/2024 Referral Source: Reather Campbell NP Chief Complaint:   Chief Complaint  Patient presents with   Establish Care   Depression   Anxiety   Medication Refill   Insomnia   Visit Diagnosis:    ICD-10-CM   1. MDD (major depressive disorder), recurrent episode, moderate (HCC)  F33.1     2. GAD (generalized anxiety disorder)  F41.1 busPIRone  (BUSPAR ) 10 MG tablet    3. Sleep apnea, unspecified type  G47.30 Ambulatory referral to Pulmonology     Discussed the use of AI scribe software for clinical note transcription with the patient, who gave verbal consent to proceed.  History of Present Illness Anita Gibbs is a 53 year old African-American female previously widowed currently engaged, lives in [place], currently unemployed, has a history of chronic pain syndrome, hypertension, GERD, obesity, seronegative spondyloarthropathy, depression, anxiety was evaluated in office today, presented to establish care.   She has been experiencing significant stress and mental health challenges following her father's death from cancer. She feels overwhelmed and has developed a 'don't care' attitude about many things. She has been seeing a therapist, Ms.Karen Pauletta Boroughs, but has not been in contact recently. Her therapist recommended more frequent sessions and referred her for further evaluation.  She is currently awaiting that.  She describes her current symptoms of depression as low motivation, low energy, anhedonia, sleep problems.  She is in a lot of pain which complicates her condition.  Pain does limit her ability to function during the day as well as affect her sleep as well.  She does report a history of sleep apnea however currently not on CPAP.  She has had a sleep study several years ago.  She continues to struggle with snoring.  Agreeable to referral for sleep study  again.  She does struggle with anxiety symptoms ongoing since the past several months.  She describes feeling restless, anxious, worrying about things in general on a regular basis.  She also reports panic attacks 3-4 times a week.  She describes them as having shortness of breath, chest tightness, feeling sweaty.  Usually triggered by any kind of stress.  May last for several minutes.  She does have hydroxyzine  available prescribed by her primary care provider to use as needed for her anxiety attacks.  There may help to some extent.  She reports she also has anxiety attacks when she is in public places like the grocery store.  She tries to get out of these kind of situations as soon as possible so it does not affect her too much.  She reports her appetite is okay.  She denies any history of trauma.  She denies any manic or hypomanic symptoms.  Denies any skin picking or hair pulling behaviors.  Denies any obsessions or compulsive behaviors.  She denies any current suicidality, homicidality or perceptual disturbances.  She denies any substance abuse problems.  She is currently on medications like duloxetine  120 mg, BuSpar  10 mg twice a day.  The BuSpar  may have been added recently and helps to some extent.  Appeared to be alert, oriented to person place time situation.  3 word memory immediate 3 out of 3, after 5 minutes 3 out of 3.  Was able to do serial sevens, attention and focus seem to be good in session.    Associated Signs/Symptoms: Depression Symptoms:  depressed mood, anhedonia, insomnia, fatigue, difficulty concentrating, anxiety,  loss of energy/fatigue, disturbed sleep, (Hypo) Manic Symptoms:  Denies Anxiety Symptoms:  Excessive Worry, Panic Symptoms, Psychotic Symptoms:  Denies PTSD Symptoms: Negative  Past Psychiatric History: Denies inpatient behavioral health admissions in the past.  Denies suicide attempts.  Denies self-injurious behaviors.  Currently under the  care of Ms.Karen Pauletta Boroughs at [facility] for therapy.  Reports she was referred for more specialized therapy and is currently awaiting an appointment as well.  Previous Psychotropic Medications: Yes Cymbalta , hydroxyzine , BuSpar   Substance Abuse History in the last 12 months:  No.  Consequences of Substance Abuse: Negative  Past Medical History:  Past Medical History:  Diagnosis Date   Abdominal pain 06/11/2020   Abnormal drug screen 03/06/2020   Abnormal MRI, cervical spine (06/10/2018) 09/27/2018   FINDINGS:  Vertebrae: fusion hardware at C4, C5, C6, and C7.  Posterior Fossa, vertebral arteries, paraspinal tissues: A relatively empty sella present.     Disc levels:  C3-4: Negative. A mild broad-based disc osteophyte complex present. Uncovertebral spurring contributes to mild foraminal narrowing bilaterally.  C4-5: A leftward disc osteophyte complex is present. Uncovertebral and facet disease   Acute cystitis without hematuria 06/18/2020   Acute vaginitis 06/11/2020   Adenoma of left adrenal gland 08/01/2020   Adrenal nodule (HCC) 06/20/2020   Allergy     ANA positive 02/01/2021   Anxiety and depression 01/29/2021   Arthritis    Back pain with history of spinal surgery 01/29/2021   Bilateral carpal tunnel syndrome 01/29/2021   Bilateral leg edema 01/29/2021   BMI 40.0-44.9, adult (HCC) 04/30/2022   Carpal boss, right    Carpal tunnel syndrome, bilateral    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) 09/27/2018   Levels:  C4-5: There is partial effacement of ventral CSF.     IMPRESSION:  Mild residual central canal narrowing at C4-5 s/p ACDF.   Cervical facet hypertrophy (C3-T1) 09/27/2018   Levels:  C3-4: Uncovertebral spurring  C4-5: Uncovertebral and facet disease  C5-6: Residual uncovertebral disease.  C6-7: Residual uncovertebral disease.  C7-T1: Minimal left-sided uncovertebral spurring   Cervical facet joint syndrome (Bilateral) (L>R) 09/27/2018   Cervical foraminal stenosis  (Bilateral: C3-4) (Left: C4-5, C5-6, and C6-7) 09/27/2018   Levels:  C3-4: Mild foraminal narrowing bilaterally.  C4-5: Mild left foraminal narrowing.  C5-6: Mild left foraminal narrowing is due to residual uncovertebral disease.  C6-7: Mild left foraminal narrowing is due to residual uncovertebral disease.   Cervical myelopathy (HCC) 07/01/2017   Cervicalgia (Primary Area of Pain) (Bilateral) (L>R) 09/27/2018   Chronic gout of foot (Left) 05/31/2018   Chronic low back pain [facility] Area of Pain) (Bilateral) (R>L) w/ sciatica (Bilateral) 09/07/2018   Chronic low back pain [facility] Area of Pain) (Bilateral) (R>L) w/o sciatica 07/26/2018   Chronic lower extremity pain (Fourth Area of Pain) (Bilateral) (R>L) 09/07/2018   Chronic musculoskeletal pain 10/27/2018   Chronic neck pain (Bilateral) w/ history of cervical spinal surgery 09/27/2018   Chronic neck pain (Primary Area of Pain) (Bilateral) (L>R) 09/07/2018   Chronic sacroiliac joint dysfunction (Bilateral) 09/27/2018   Chronic sacroiliac joint pain (Right) 09/07/2018   Chronic upper extremity pain (Secondary Area of Pain) (Bilateral) (R>L) 09/27/2018   Disorder of skeletal system 09/07/2018   E. coli UTI (urinary tract infection) 06/20/2020   Elevated sed rate 09/08/2018   Elevated serum creatinine 06/20/2020   Excessive daytime sleepiness 03/06/2020   Female pelvic pain 09/05/2020   GERD (gastroesophageal reflux disease)    Headache    History of allergy  to shellfish 09/28/2018  History of fusion of cervical spine (ACDF C4-C7) 09/27/2018   History of illicit drug use 03/07/2020   Hives 09/05/2020   Hives 09/05/2020   Hyperkalemia 05/31/2018   Mild   Hyperlipidemia 04/30/2022   Hypertension    Large breasts 05/30/2020   Left hip pain 07/21/2022   Lumbar facet arthropathy 04/20/2019   Lumbar radiculitis (L5 dermatome) (Right) 04/17/2020   Multiple environmental allergies 01/17/2020   Multiple food allergies 03/07/2020    Neurogenic pain 10/27/2018   Numbness and tingling of both feet 05/31/2018   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   Other intervertebral disc degeneration, lumbar region 11/15/2019   Pharmacologic therapy 09/07/2018   Plantar fasciitis, bilateral 03/04/2021   Polyneuropathy 01/29/2021   Prediabetes 10/10/2019   Problems influencing health status 09/07/2018   Rash 08/08/2019   Seasonal allergic rhinitis due to pollen 11/29/2018   Seasonal allergies 03/06/2020   Seasonal asthma 11/29/2018   Sleep apnea    Somatic dysfunction of sacroiliac joint (Bilateral) 09/27/2018   Spondylosis without myelopathy or radiculopathy, cervical region 03/22/2019   Spondylosis without myelopathy or radiculopathy, lumbosacral region 05/19/2019   Spondylosis, cervical, w/ myelopathy 09/27/2018   Vaginal discharge 06/11/2020    Past Surgical History:  Procedure Laterality Date   ABDOMINAL HYSTERECTOMY  10/17/2020   [facility]; she has both ovaries   ANTERIOR CERVICAL DECOMP/DISCECTOMY FUSION N/A 07/01/2017   Procedure: ANTERIOR CERVICAL DECOMPRESSION/DISCECTOMY FUSION 3 LEVELS;  Surgeon: Jodeen Munch, MD;  Location: [facility] ORS;  Service: Neurosurgery;  Laterality: N/A;   CARPAL TUNNEL RELEASE Bilateral    KNEE ARTHROSCOPY Right     Family Psychiatric History: As noted below.  Family History:  Family History  Problem Relation Age of Onset   [name] Mother    Diabetes Mother    Arthritis Mother    Hypertension Mother    Hyperlipidemia Mother    Hypertension Father    Arthritis Father    Hearing loss Father    Kidney disease Father    Lymphoma Father        large b cell    Arthritis Sister    Hypertension Sister    Cancer Brother        ? lung due to exposure    COPD Brother    Early death Brother    Post-traumatic stress disorder Brother    Breast cancer Maternal Aunt    Breast cancer Paternal Aunt    Sleep apnea Daughter    COPD Son    Depression Son     Post-traumatic stress disorder Son    Allergic rhinitis Neg Hx    Angioedema Neg Hx    Eczema Neg Hx    Urticaria Neg Hx    Asthma Neg Hx     Social History:   Social History   Socioeconomic History   Marital status: Widowed    Spouse name: Not on file   Number of children: 2   Years of education: Not on file   Highest education level: 12th grade  Occupational History   Not on file  Tobacco Use   Smoking status: Never   Smokeless tobacco: Never  Vaping Use   Vaping status: Never Used  Substance and Sexual Activity   Alcohol use: Yes    Alcohol/week: 2.0 standard drinks of alcohol    Types: 2 Glasses of wine per week    Comment: occ   Drug use: No   Sexual activity: Yes    Birth control/protection: None  Other Topics Concern  Not on file  Social History Narrative   Bus driver    And school custodian    1 daughter lives in [place] 28 y.o as of 01/17/20   1 son 38 almost 45 in [place]   UJWJXB [employer]    Social Drivers of Health   Financial Resource Strain: High Risk (01/13/2024)   Received from [facility]   Overall Financial Resource Strain (CARDIA)    Difficulty of Paying Living Expenses: Very hard  Food Insecurity: Food Insecurity Present (01/13/2024)   Received from [facility]   Hunger Vital Sign    Worried About Running Out of Food in the Last Year: Often true    Ran Out of Food in the Last Year: Often true  Transportation Needs: No Transportation Needs (01/13/2024)   Received from [facility] - Transportation    In the past 12 months, has lack of transportation kept you from medical appointments or from getting medications?: No    Lack of Transportation (Non-Medical): No  Physical Activity: Insufficiently Active (09/28/2023)   Exercise Vital Sign    Days of Exercise per Week: 2 days    Minutes of Exercise per Session: 10 min  Stress: Stress Concern Present (09/28/2023)    [employer] of Occupational Health - Occupational Stress Questionnaire    Feeling of Stress : Very much  Social Connections: Moderately Isolated (09/28/2023)   Social Connection and Isolation Panel [NHANES]    Frequency of Communication with Friends and Family: Three times a week    Frequency of Social Gatherings with Friends and Family: Once a week    Attends Religious Services: More than 4 times per year    Active Member of [employer] or Organizations: No    Attends Banker Meetings: Not on file    Marital Status: Widowed    Additional Social History: She was born and raised in [place], [place] .  She was raised by both parents.  She graduated high school, did some college.  She had 1 brother, half-sister.  She was married twice in the past.  Widowed.  Currently engaged to her fianc.  She has a son and a daughter and 6 grandchildren.  She currently lives in [place].  Good support system from her fianc.  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  She stopped working in 2024.  She denies legal problems.  Denies being in the [employer].  Reports she is religious.  Denies access to a gun.  Allergies:   Allergies  Allergen Reactions   Other Hives    White and red sauces   Pineapple     Swelling    Shellfish Allergy  Hives   Strawberry (Diagnostic) Hives   Tomato Hives    cherry    Metabolic Disorder Labs: Lab Results  Component Value Date   HGBA1C 6.1 11/02/2023   No results found for: "PROLACTIN" Lab Results  Component Value Date   CHOL 246 (H) 04/08/2023   TRIG 113.0 04/08/2023   HDL 64.60 04/08/2023   CHOLHDL 4 04/08/2023   VLDL 22.6 04/08/2023   LDLCALC 159 (H) 04/08/2023   LDLCALC 108 (H) 08/29/2021   Lab Results  Component Value Date   TSH 1.43 04/08/2023    Therapeutic Level Labs: No results found for: "LITHIUM" No results found for: "CBMZ" No results found for: "VALPROATE"  Current  Medications: Current Outpatient Medications  Medication  Sig Dispense Refill   albuterol  (VENTOLIN  HFA) 108 (90 Base) MCG/ACT inhaler Inhale 2 puffs into the lungs every 6 (six) hours as needed for wheezing or shortness of breath. 8 g 11   amLODipine  (NORVASC ) 5 MG tablet Take 1 tablet (5 mg total) by mouth daily. 90 tablet 0   atorvastatin  (LIPITOR) 10 MG tablet MWF at night 90 tablet 3   buprenorphine  (BUTRANS ) 15 MCG/HR Place 1 patch onto the skin once a week. 4 patch 2   butalbital -acetaminophen -caffeine  (FIORICET) 50-325-40 MG tablet Take 1 tablet by mouth every 6 (six) hours as needed for headache. 30 tablet 0   cetirizine  (ZYRTEC  ALLERGY ) 10 MG tablet Take 1 tablet (10 mg total) by mouth at bedtime as needed. 90 tablet 3   Cholecalciferol (VITAMIN D -3) 125 MCG (5000 UT) TABS Take by mouth daily.     Cyanocobalamin  (B-12 PO) Take by mouth.     dicyclomine  (BENTYL ) 20 MG tablet Take 1 tablet (20 mg total) by mouth 4 (four) times daily -  before meals and at bedtime. 120 tablet 1   DULoxetine  (CYMBALTA ) 60 MG capsule Take 1 capsule (60 mg total) by mouth 2 (two) times daily. 60 capsule 2   EPINEPHrine  (AUVI-Q ) 0.3 mg/0.3 mL IJ SOAJ injection Use as directed for severe allergic reaction 2 Device 2   fluconazole  (DIFLUCAN ) 150 MG tablet Take 1 tablet by mouth x 1 dose. May repeat in 72 hours if needed. 2 tablet 0   fluticasone  (FLONASE ) 50 MCG/ACT nasal spray Place 2 sprays into both nostrils daily. 16 g 6   hydrocortisone  2.5 % cream Apply topically 2 (two) times daily. Prn to eyelids 30 g 0   hydrOXYzine  (VISTARIL ) 25 MG capsule Take 1 capsule (25 mg total) by mouth 3 (three) times daily. 90 capsule 2   ibuprofen (ADVIL) 800 MG tablet Take 800 mg by mouth 3 (three) times daily.     losartan  (COZAAR ) 100 MG tablet Take 1 tablet (100 mg total) by mouth daily. 90 tablet 3   meloxicam  (MOBIC ) 15 MG tablet Take 1 tablet (15 mg total) by mouth every other day as needed. 45 tablet 11   methocarbamol   (ROBAXIN ) 750 MG tablet Take 1 tablet (750 mg total) by mouth every 8 (eight) hours as needed for muscle spasms. 90 tablet 11   montelukast  (SINGULAIR ) 10 MG tablet Take 1 tablet (10 mg total) by mouth at bedtime. 90 tablet 3   pantoprazole  (PROTONIX ) 40 MG tablet TAKE ONE TABLET EVERY DAY 30 MIN BEFORE DINNER 90 tablet 3   polyethylene glycol powder (GLYCOLAX /MIRALAX ) 17 GM/SCOOP powder Take 17 g by mouth 2 (two) times daily as needed. 3350 g 1   potassium chloride  SA (KLOR-CON  M) 20 MEQ tablet Take 1 tablet (20 mEq total) by mouth daily as needed. With lasix  30 tablet 11   pregabalin  (LYRICA ) 75 MG capsule Take 1 capsule (75 mg total) by mouth 3 (three) times daily. 90 capsule 11   saccharomyces boulardii (FLORASTOR) 250 MG capsule Take 1 capsule (250 mg total) by mouth daily. 90 capsule 0   triamcinolone  cream (KENALOG ) 0.1 % Apply 1 Application topically 2 (two) times daily. 30 g 0   busPIRone  (BUSPAR ) 10 MG tablet Take 2 tablets (20 mg total) by mouth 2 (two) times daily. 120 tablet 1   No current facility-administered medications for this visit.    Musculoskeletal: Strength & Muscle Tone: within normal limits Gait & Station: walks with cane Patient leans: N/A  Psychiatric Specialty  Exam: Review of Systems  Psychiatric/Behavioral:  Positive for dysphoric mood and sleep disturbance. The patient is nervous/anxious.     Blood pressure 128/84, pulse 65, temperature 97.8 F (36.6 C), temperature source Temporal, height 5\' 3"  (1.6 m), weight 250 lb 6.4 oz (113.6 kg).Body mass index is 44.36 kg/m.  General Appearance: Casual  Eye Contact:  Fair  Speech:  Clear and Coherent  Volume:  Normal  Mood:  Anxious and Depressed  Affect:  Tearful  Thought Process:  Goal Directed and Descriptions of Associations: Intact  Orientation:  Full (Time, Place, and Person)  Thought Content:  Logical  Suicidal Thoughts:  No  Homicidal Thoughts:  No  Memory:  Immediate;   Fair Recent;   Fair Remote;    Fair  Judgement:  Fair  Insight:  Fair  Psychomotor Activity:  Normal  Concentration:  Concentration: Fair and Attention Span: Fair  Recall:  limited  Fund of Knowledge:Fair  Language: Fair  Akathisia:  No  Handed:  Left  AIMS (if indicated):  not done  Assets:  Desire for Improvement Housing Social Support Transportation  ADL's:  Intact  Cognition: WNL  Sleep:  Poor   Screenings: GAD-7    Garment/textile technologist Visit from 01/18/2024 in [facility] Office Visit from 01/05/2024 in [facility] [employer] Visit from 11/24/2023 in [facility] [employer] Visit from 11/02/2023 in [facility] [employer] Visit from 09/30/2023 in [facility] [employer]  Total GAD-7 Score 19 19 21 21 20       PHQ2-9    Flowsheet Row Office Visit from 01/18/2024 in [facility] Office Visit from 01/12/2024 in [facility] Visit from 01/05/2024 in [facility] Visit from 11/24/2023 in [facility] [employer] Visit from 11/02/2023 in [facility] [employer]  PHQ-2 Total Score 6 6 6 4 6   PHQ-9 Total Score 17 -- 25 14 21       Flowsheet Row Office Visit from 01/18/2024 in [facility] ED from 11/18/2023 in [facility]  C-SSRS RISK CATEGORY No Risk No Risk       Assessment and Plan:Giulietta C Buckbee is a 53 year old African-American female who has a history of depression, anxiety was evaluated in office today, discussed assessment and plan as noted below.  Assessment & Plan Depression-unstable Depression significantly impacts daily functioning,  ongoing for approximately one year. Current treatment includes duloxetine  60 mg twice daily. Depression screening score is high. Emphasized addressing sleep apnea and regular therapy sessions to improve depression. - Increase Buspirone  to 20 mg twice daily. - Continue Duloxetine  60 mg twice daily. - Encourage regular therapy sessions with therapist Ms.Karen Pauletta Boroughs. - Consider changing Duloxetine  if depression persists after addressing sleep apnea and continuing psychotherapy.  Anxiety disorder-unstable Anxiety disorder with frequent panic attacks, occurring 3-4 times per week. Current treatment includes buspirone  and hydroxyzine . Anxiety exacerbated by stress and situational factors. - Increase Buspirone  to 20 mg twice daily. - Use Hydroxyzine  25 mg prescribed as 3 times a day for anxiety, sleep. - Encouraged to continue CBT.  Sleep apnea-unstable Sleep apnea previously diagnosed, not using CPAP due to discomfort. Discussed risks of untreated sleep apnea, including cardiovascular complications, dementia. - Refer for sleep study to  reassess sleep apnea and CPAP needs.  Reviewed labs-dated 04/08/2023-TSH-1.43-within normal limits.  11/18/2023-CMP-sodium within normal limits, AST-12, ALT-12. CBC with differential-platelet counts-282-within normal limits.   Follow-up Follow-up plan to monitor progress and adjust treatment as needed. - Schedule follow-up appointment in 3-4 weeks, preferably as a virtual visit.    Collaboration of Care: Referral or follow-up with counselor/therapist AEB patient to continue psychotherapy sessions.  Patient to follow up with pulmonology for addressing sleep apnea.  Patient referred for the same.  I have reviewed notes from primary care provider-Ms. Lester-dated 01/05/2024-patient with continued anxiety-currently on hydroxyzine , BuSpar  and Cymbalta .  She continues to have uncontrolled pain.  Patient/Guardian was advised Release of Information must be obtained prior to  any record release in order to collaborate their care with an outside provider. Patient/Guardian was advised if they have not already done so to contact the registration department to sign all necessary forms in order for us  to release information regarding their care.   Consent: Patient/Guardian gives verbal consent for treatment and assignment of benefits for services provided during this visit. Patient/Guardian expressed understanding and agreed to proceed.  This note was generated in part or whole with voice recognition software. Voice recognition is usually quite accurate but there are transcription errors that can and very often do occur. I apologize for any typographical errors that were not detected and corrected.    Izaia Say, MD 5/20/202510:21 AM

## 2024-01-22 ENCOUNTER — Ambulatory Visit (INDEPENDENT_AMBULATORY_CARE_PROVIDER_SITE_OTHER): Admitting: Clinical

## 2024-01-22 DIAGNOSIS — F411 Generalized anxiety disorder: Secondary | ICD-10-CM | POA: Diagnosis not present

## 2024-01-22 DIAGNOSIS — F32A Depression, unspecified: Secondary | ICD-10-CM

## 2024-01-22 DIAGNOSIS — F410 Panic disorder [episodic paroxysmal anxiety] without agoraphobia: Secondary | ICD-10-CM | POA: Diagnosis not present

## 2024-01-22 NOTE — Progress Notes (Signed)
   Doree Barthel, LCSW

## 2024-01-22 NOTE — Progress Notes (Signed)
 [facility] Counselor/Therapist Progress Note  Patient ID: Anita Gibbs, MRN: [id number],    Date: 01/22/2024  Time Spent: 1:33pm - 2:21pm : 48 minutes   Treatment Type: Individual Therapy  Reported Symptoms: difficulty falling asleep and staying asleep, recent feelings of anxiety  Mental Status Exam: Appearance:  Neat and Well Groomed     Behavior: Appropriate  Motor: Normal  Speech/Language:  Clear and Coherent and Normal Rate  Affect: Appropriate  Mood: Patient stated, "ok" in response to current mood  Thought process: normal  Thought content:   WNL  Sensory/Perceptual disturbances:   WNL  Orientation: oriented to person, place, time/date, and situation  Attention: Good  Concentration: Good  Memory: WNL  Fund of knowledge:  Good  Insight:   Good  Judgment:  Good  Impulse Control: Good   Risk Assessment: Danger to Self:  No Patient denied current suicidal ideation  Self-injurious Behavior: No Danger to Others: No Patient denied current homicidal ideation Duty to Warn:no Physical Aggression / Violence:No  Access to Firearms a concern: No  Gang Involvement:No   Subjective: Patient reported at this time she has not started IOP. Patient reported she had an appointment with a psychiatrist on Jan 18, 2024 and reported the psychiatrist recommended patient participate in IOP.  Patient reported psychiatrist increased one of patient's medications during patient's recent appointment. Patient stated, "ok" in response to patient's mood. Patient reported patient's daughter is angry with patient due to patient selling patient's home. Patient stated, "up and down" in response to symptoms of anxiety since last session. Patient stated, "the breathing that's been helpful", "sitting outside relaxing more trying to get fresh air". Patient stated, "I've been visualizing I was the beach". Patient reported "my family being phony, not reliable" is a trigger for anxiety. Patient  reported experiencing anxiety when walking in the grocery store and at the doctors office. Patient reported she feels being in pain in the grocery store may be a trigger for anxiety.  Patient stated, "may be it could be the topics were talking about" in reference to triggers for anxiety. Patient reported recently seeing patient's father's wife at a doctors appointment triggered feelings of anxiety and reported  interactions with family members are a trigger for feelings of anxiety. Patient reported she is open to CBTI.   Interventions: Cognitive Behavioral Therapy. Clinician conducted session via caregility video from clinician's home office. Patient provided verbal consent to proceed with telehealth session and is aware of limitations of telephone or video visits. Patient participated in session from patient's home. Reviewed recommendation and referral for IOP. Reviewed events since last session and assessed for changes. Discussed recent appointment with psychiatrist and psychiatrist's recommendations. Assessed intensity and frequency of anxiety since last session. Reviewed relaxation techniques previously discussed in therapy and efficacy. Explored and identified triggers for anxiety. Provided psycho education related to connection between thoughts, feelings, behaviors and CBT. Explored thoughts associated with anxiety. Discussed coping strategies for patient to utilize when feeling angry. Discussed recommendation for CBTI upon discharge from IOP.    Collaboration of Care: Other discussed consent for IOP referral   Diagnosis:  Panic disorder   Generalized anxiety disorder   Depression, unspecified depression type     Plan: Patient is to utilize [employer] [facility], thought re-framing, behavioral activation, relaxation techniques, mindfulness and coping strategies to decrease symptoms associated with their diagnosis. Frequency: bi-weekly  Modality: individual      Long-term goal:    Reduce overall level, frequency, and intensity of the  feelings of depression, anxiety and panic as evidenced by decreased panic attacks,fear of future panic attacks, lack of  concentration, difficulty falling asleep and staying asleep, muscle tension, feeling on edge, restlessness, and irritability from 7 days/week to 1 to 3 days/week per patient report for at least 3 consecutive months. Target Date: 12/03/24  Progress: progressing    Short-term goal:  Identify and access local resources to increase patient's financial stability Target Date: 06/04/24  Progress: progressing    Decrease feelings of panic/anxiety from 4 to 5 days per week to 1 to 2 days per week by implementing healthy coping strategies, such as, relaxation techniques and mindfulness exercises Target Date: 06/04/24  Progress: progressing    Identify, challenge, and replace negative thought patterns and negative self talk that contribute to feelings of depression and anxiety with positive thoughts, beliefs, and positive self talk per patient's report Target Date: 06/04/24  Progress: progressing    Increase patient's participation in activities patient enjoys from 1 time per week to 3 times per week  Target Date: 06/04/24  Progress: progressing                  Burlene Carpen, LCSW

## 2024-01-26 ENCOUNTER — Other Ambulatory Visit: Payer: Self-pay | Admitting: Nurse Practitioner

## 2024-01-26 DIAGNOSIS — I10 Essential (primary) hypertension: Secondary | ICD-10-CM

## 2024-01-26 DIAGNOSIS — M47819 Spondylosis without myelopathy or radiculopathy, site unspecified: Secondary | ICD-10-CM | POA: Diagnosis not present

## 2024-01-26 DIAGNOSIS — R1030 Lower abdominal pain, unspecified: Secondary | ICD-10-CM

## 2024-01-28 ENCOUNTER — Other Ambulatory Visit: Payer: Self-pay

## 2024-01-28 ENCOUNTER — Ambulatory Visit: Payer: 59 | Admitting: Nurse Practitioner

## 2024-01-28 DIAGNOSIS — R202 Paresthesia of skin: Secondary | ICD-10-CM

## 2024-01-28 DIAGNOSIS — H5213 Myopia, bilateral: Secondary | ICD-10-CM | POA: Diagnosis not present

## 2024-02-01 ENCOUNTER — Encounter: Payer: Self-pay | Admitting: Student in an Organized Health Care Education/Training Program

## 2024-02-01 ENCOUNTER — Ambulatory Visit
Admission: RE | Admit: 2024-02-01 | Discharge: 2024-02-01 | Disposition: A | Discharge status: Home / Self Care | Source: Ambulatory Visit | Attending: Student in an Organized Health Care Education/Training Program | Admitting: Student in an Organized Health Care Education/Training Program

## 2024-02-01 ENCOUNTER — Ambulatory Visit
Attending: Student in an Organized Health Care Education/Training Program | Admitting: Student in an Organized Health Care Education/Training Program

## 2024-02-01 DIAGNOSIS — M47816 Spondylosis without myelopathy or radiculopathy, lumbar region: Secondary | ICD-10-CM | POA: Insufficient documentation

## 2024-02-01 MED ORDER — LIDOCAINE HCL 2 % IJ SOLN
20.0000 mL | Freq: Once | INTRAMUSCULAR | Status: AC
  Administered 2024-02-01: 400 mg

## 2024-02-01 MED ORDER — MIDAZOLAM HCL 5 MG/5ML IJ SOLN
0.5000 mg | Freq: Once | INTRAMUSCULAR | Status: AC
  Administered 2024-02-01: 2 mg via INTRAVENOUS

## 2024-02-01 MED ORDER — FENTANYL CITRATE (PF) 100 MCG/2ML IJ SOLN
25.0000 ug | INTRAMUSCULAR | Status: DC | PRN
  Administered 2024-02-01: 50 ug via INTRAVENOUS

## 2024-02-01 MED ORDER — DEXAMETHASONE SODIUM PHOSPHATE 10 MG/ML IJ SOLN
INTRAMUSCULAR | Status: AC
  Filled 2024-02-01: qty 2

## 2024-02-01 MED ORDER — ROPIVACAINE HCL 2 MG/ML IJ SOLN
18.0000 mL | Freq: Once | INTRAMUSCULAR | Status: AC
  Administered 2024-02-01: 18 mL via PERINEURAL

## 2024-02-01 MED ORDER — ROPIVACAINE HCL 2 MG/ML IJ SOLN
INTRAMUSCULAR | Status: AC
Start: 2024-02-01 — End: ?
  Filled 2024-02-01: qty 20

## 2024-02-01 MED ORDER — LIDOCAINE HCL 2 % IJ SOLN
INTRAMUSCULAR | Status: AC
Start: 2024-02-01 — End: ?
  Filled 2024-02-01: qty 20

## 2024-02-01 MED ORDER — LACTATED RINGERS IV SOLN: Freq: Once | INTRAVENOUS | Status: AC

## 2024-02-01 MED ORDER — MIDAZOLAM HCL 5 MG/5ML IJ SOLN
INTRAMUSCULAR | Status: AC
  Filled 2024-02-01: qty 5

## 2024-02-01 MED ORDER — FENTANYL CITRATE (PF) 100 MCG/2ML IJ SOLN
INTRAMUSCULAR | Status: AC
  Filled 2024-02-01: qty 2

## 2024-02-01 MED ORDER — DEXAMETHASONE SODIUM PHOSPHATE 10 MG/ML IJ SOLN
20.0000 mg | Freq: Once | INTRAMUSCULAR | Status: AC
  Administered 2024-02-01: 20 mg

## 2024-02-01 NOTE — Progress Notes (Signed)
 Safety precautions to be maintained throughout the outpatient stay will include: orient to surroundings, keep bed in low position, maintain call bell within reach at all times, provide assistance with transfer out of bed and ambulation.

## 2024-02-01 NOTE — Patient Instructions (Signed)
Moderate Conscious Sedation, Adult, Care After  After the procedure, it is common to have:  Sleepiness for a few hours.  Impaired judgment for a few hours.  Trouble with balance.  Nausea or vomiting if you eat too soon.  Follow these instructions at home:  For the time period you were told by your health care provider:    Rest.  Do not participate in activities where you could fall or become injured.  Do not drive or use machinery.  Do not drink alcohol.  Do not take sleeping pills or medicines that cause drowsiness.  Do not make important decisions or sign legal documents.  Do not take care of children on your own.  Eating and drinking  Follow instructions from your health care provider about what you may eat and drink.  Drink enough fluid to keep your urine pale yellow.  If you vomit:  Drink clear fluids slowly and in small amounts as you are able. Clear fluids include water, ice chips, low-calorie sports drinks, and fruit juice that has water added to it (diluted fruit juice).  Eat light and bland foods in small amounts as you are able. These foods include bananas, applesauce, rice, lean meats, toast, and crackers.  General instructions  Take over-the-counter and prescription medicines only as told by your health care provider.  Have a responsible adult stay with you for the time you are told.  Do not use any products that contain nicotine or tobacco. These products include cigarettes, chewing tobacco, and vaping devices, such as e-cigarettes. If you need help quitting, ask your health care provider.  Return to your normal activities as told by your health care provider. Ask your health care provider what activities are safe for you.  Your health care provider may give you more instructions. Make sure you know what you can and cannot do.  Contact a health care provider if:  You are still sleepy or having trouble with balance after 24 hours.  You feel light-headed.  You vomit every time you eat or drink.  You get  a rash.  You have a fever.  You have redness or swelling around the IV site.  Get help right away if:  You have trouble breathing.  You start to feel confused at home.  These symptoms may be an emergency. Get help right away. Call 911.  Do not wait to see if the symptoms will go away.  Do not drive yourself to the hospital.  This information is not intended to replace advice given to you by your health care provider. Make sure you discuss any questions you have with your health care provider.  Document Revised: 03/03/2022 Document Reviewed: 03/03/2022  [employer]vier Patient Education Â© 2024 [employer]

## 2024-02-01 NOTE — Progress Notes (Signed)
 PROVIDER NOTE: Interpretation of information contained herein should be left to medically-trained personnel. Specific patient instructions are provided elsewhere under "Patient Instructions" section of medical record. This document was created in part using STT-dictation technology, any transcriptional errors that may result from this process are unintentional.  Patient: Anita Gibbs Type: Established DOB: 08-29-1971 MRN: [id number] PCP: Bluford Burkitt, NP  Service: Procedure DOS: 02/01/2024 Setting: Ambulatory Location: Ambulatory outpatient facility Delivery: Face-to-face Provider: Cephus Collin, MD Specialty: Interventional Pain Management Specialty designation: 09 Location: Outpatient facility Ref. Prov.: Cephus Collin, MD       Interventional Therapy     Procedure: Lumbar Facet, Medial Branch Radiofrequency Ablation (RFA) #2  Laterality: Right (-RT)  Level: L3, L4, and L5 Medial Branch Level(s). These levels will denervate the L3-4 and L4-5 lumbar facet joints.  Imaging: Fluoroscopy-guided         Anesthesia: Local anesthesia (1-2% Lidocaine ) Sedation: Moderate Sedation                       DOS: 02/01/2024  Performed by: Cephus Collin, MD  Purpose: Therapeutic/Palliative Indications: Low back pain severe enough to impact quality of life or function. Indications: 1. Lumbar facet syndrome (Bilateral) (R>L)   2. Lumbar facet arthropathy    Anita Gibbs has been dealing with the above chronic pain for longer than three months and has either failed to respond, was unable to tolerate, or simply did not get enough benefit from other more conservative therapies including, but not limited to: 1. Over-the-counter medications 2. Anti-inflammatory medications 3. Muscle relaxants 4. Membrane stabilizers 5. Opioids 6. Physical therapy and/or chiropractic manipulation 7. Modalities (Heat, ice, etc.) 8. Invasive techniques such as nerve blocks. Anita Gibbs has attained more than 50% relief of  the pain from a series of diagnostic injections conducted in separate occasions.  Pain Score: Pre-procedure: 5 /10 Post-procedure: 0-No pain/10     Position / Prep / Materials:  Position: Prone  Prep solution: DuraPrep (Iodine Povacrylex [0.7% available iodine] and Isopropyl Alcohol, 74% w/w) Prep Area: Entire Lumbosacral Region (Lower back from mid-thoracic region to end of tailbone and from flank to flank.) Materials:  Tray: RFA (Radiofrequency) tray Needle(s):  Type: RFA (Teflon-coated radiofrequency ablation needles)   Pre-op H&P Assessment:  Anita Gibbs is a 53 y.o. (year old), female patient, seen today for interventional treatment. She  has a past surgical history that includes Knee arthroscopy (Right); Anterior cervical decomp/discectomy fusion (N/A, 07/01/2017); Abdominal hysterectomy (10/17/2020); and Carpal tunnel release (Bilateral). Anita Gibbs has a current medication list which includes the following prescription(s): albuterol , amlodipine , atorvastatin , buprenorphine , buspirone , butalbital -acetaminophen -caffeine , cetirizine , vitamin d -3, cyanocobalamin , dicyclomine , duloxetine , epinephrine , fluconazole , fluticasone , hydrocortisone , hydroxyzine , ibuprofen, losartan , meloxicam , methocarbamol , montelukast , pantoprazole , polyethylene glycol powder, potassium chloride  sa, pregabalin , saccharomyces boulardii, and triamcinolone  cream, and the following Facility-Administered Medications: fentanyl . Her primarily concern today is the Back Pain (lower)  Initial Vital Signs:  Pulse/HCG Rate: 66ECG Heart Rate: (!) 57 (sb) Temp: (!) 97.3 F (36.3 C) Resp: 16 BP: (!) 147/87 SpO2: 100 %  BMI: Estimated body mass index is 42.69 kg/m as calculated from the following:   Height as of this encounter: 5\' 3"  (1.6 m).   Weight as of this encounter: 241 lb (109.3 kg).  Risk Assessment: Allergies: Reviewed. She is allergic to other, pineapple, shellfish allergy , strawberry (diagnostic), and  tomato.  Allergy  Precautions: None required Coagulopathies: Reviewed. None identified.  Blood-thinner therapy: None at this time Active Infection(s): Reviewed. None identified. Anita Gibbs is afebrile  Site  Confirmation: Anita Gibbs was asked to confirm the procedure and laterality before marking the site Procedure checklist: Completed Consent: Before the procedure and under the influence of no sedative(s), amnesic(s), or anxiolytics, the patient was informed of the treatment options, risks and possible complications. To fulfill our ethical and legal obligations, as recommended by the American Medical Association's Code of Ethics, I have informed the patient of my clinical impression; the nature and purpose of the treatment or procedure; the risks, benefits, and possible complications of the intervention; the alternatives, including doing nothing; the risk(s) and benefit(s) of the alternative treatment(s) or procedure(s); and the risk(s) and benefit(s) of doing nothing. The patient was provided information about the general risks and possible complications associated with the procedure. These may include, but are not limited to: failure to achieve desired goals, infection, bleeding, organ or nerve damage, allergic reactions, paralysis, and death. In addition, the patient was informed of those risks and complications associated to Spine-related procedures, such as failure to decrease pain; infection (i.e.: Meningitis, epidural or intraspinal abscess); bleeding (i.e.: epidural hematoma, subarachnoid hemorrhage, or any other type of intraspinal or peri-dural bleeding); organ or nerve damage (i.e.: Any type of peripheral nerve, nerve root, or spinal cord injury) with subsequent damage to sensory, motor, and/or autonomic systems, resulting in permanent pain, numbness, and/or weakness of one or several areas of the body; allergic reactions; (i.e.: anaphylactic reaction); and/or death. Furthermore, the patient  was informed of those risks and complications associated with the medications. These include, but are not limited to: allergic reactions (i.e.: anaphylactic or anaphylactoid reaction(s)); adrenal axis suppression; blood sugar elevation that in diabetics may result in ketoacidosis or comma; water retention that in patients with history of congestive heart failure may result in shortness of breath, pulmonary edema, and decompensation with resultant heart failure; weight gain; swelling or edema; medication-induced neural toxicity; particulate matter embolism and blood vessel occlusion with resultant organ, and/or nervous system infarction; and/or aseptic necrosis of one or more joints. Finally, the patient was informed that Medicine is not an exact science; therefore, there is also the possibility of unforeseen or unpredictable risks and/or possible complications that may result in a catastrophic outcome. The patient indicated having understood very clearly. We have given the patient no guarantees and we have made no promises. Enough time was given to the patient to ask questions, all of which were answered to the patient's satisfaction. Anita Gibbs has indicated that she wanted to continue with the procedure. Attestation: I, the ordering provider, attest that I have discussed with the patient the benefits, risks, side-effects, alternatives, likelihood of achieving goals, and potential problems during recovery for the procedure that I have provided informed consent. Date  Time: 02/01/2024  7:53 AM   Pre-Procedure Preparation:  Monitoring: As per clinic protocol. Respiration, ETCO2, SpO2, BP, heart rate and rhythm monitor placed and checked for adequate function Safety Precautions: Patient was assessed for positional comfort and pressure points before starting the procedure. Time-out: I initiated and conducted the "Time-out" before starting the procedure, as per protocol. The patient was asked to participate by  confirming the accuracy of the "Time Out" information. Verification of the correct person, site, and procedure were performed and confirmed by me, the nursing staff, and the patient. "Time-out" conducted as per Joint Commission's Universal Protocol (UP.01.01.01). Time: 0847  Description of Procedure:          Laterality: See above. Levels:  See above. Safety Precautions: Aspiration looking for blood return was conducted prior to all  injections. At no point did we inject any substances, as a needle was being advanced. Before injecting, the patient was told to immediately notify me if she was experiencing any new onset of "ringing in the ears, or metallic taste in the mouth". No attempts were made at seeking any paresthesias. Safe injection practices and needle disposal techniques used. Medications properly checked for expiration dates. SDV (single dose vial) medications used. After the completion of the procedure, all disposable equipment used was discarded in the proper designated medical waste containers. Local Anesthesia: Protocol guidelines were followed. The patient was positioned over the fluoroscopy table. The area was prepped in the usual manner. The time-out was completed. The target area was identified using fluoroscopy. A 12-in long, straight, sterile hemostat was used with fluoroscopic guidance to locate the targets for each level blocked. Once located, the skin was marked with an approved surgical skin marker. Once all sites were marked, the skin (epidermis, dermis, and hypodermis), as well as deeper tissues (fat, connective tissue and muscle) were infiltrated with a small amount of a short-acting local anesthetic, loaded on a 10cc syringe with a 25G, 1.5-in  Needle. An appropriate amount of time was allowed for local anesthetics to take effect before proceeding to the next step. Technical description of process:  Radiofrequency Ablation (RFA) L3 Medial Branch Nerve RFA: The target area for the  L3 medial branch is at the junction of the postero-lateral aspect of the superior articular process and the superior, posterior, and medial edge of the transverse process of L4. Under fluoroscopic guidance, a Radiofrequency needle was inserted until contact was made with os over the superior postero-lateral aspect of the pedicular shadow (target area). Sensory and motor testing was conducted to properly adjust the position of the needle. Once satisfactory placement of the needle was achieved, the numbing solution was slowly injected after negative aspiration for blood. 2.0 mL of the nerve block solution was injected without difficulty or complication. After waiting for at least 3 minutes, the ablation was performed. Once completed, the needle was removed intact. L4 Medial Branch Nerve RFA: The target area for the L4 medial branch is at the junction of the postero-lateral aspect of the superior articular process and the superior, posterior, and medial edge of the transverse process of L5. Under fluoroscopic guidance, a Radiofrequency needle was inserted until contact was made with os over the superior postero-lateral aspect of the pedicular shadow (target area). Sensory and motor testing was conducted to properly adjust the position of the needle. Once satisfactory placement of the needle was achieved, the numbing solution was slowly injected after negative aspiration for blood. 2.0 mL of the nerve block solution was injected without difficulty or complication. After waiting for at least 3 minutes, the ablation was performed. Once completed, the needle was removed intact. L5 Medial Branch Nerve RFA: The target area for the L5 medial branch is at the junction of the postero-lateral aspect of the superior articular process of S1 and the superior, posterior, and medial edge of the sacral ala. Under fluoroscopic guidance, a Radiofrequency needle was inserted until contact was made with os over the superior  postero-lateral aspect of the pedicular shadow (target area). Sensory and motor testing was conducted to properly adjust the position of the needle. Once satisfactory placement of the needle was achieved, the numbing solution was slowly injected after negative aspiration for blood. 2.0 mL of the nerve block solution was injected without difficulty or complication. After waiting for at least 3 minutes, the  ablation was performed. Once completed, the needle was removed intact.  Radiofrequency lesioning (ablation):  Radiofrequency Generator: Medtronic AccurianTM AG 1000 RF Generator Sensory Stimulation Parameters: 50 Hz was used to locate & identify the nerve, making sure that the needle was positioned such that there was no sensory stimulation below 0.3 V or above 0.7 V. Motor Stimulation Parameters: 2 Hz was used to evaluate the motor component. Care was taken not to lesion any nerves that demonstrated motor stimulation of the lower extremities at an output of less than 2.5 times that of the sensory threshold, or a maximum of 2.0 V. Lesioning Technique Parameters: Standard Radiofrequency settings. (Not bipolar or pulsed.) Temperature Settings: 80 degrees C Lesioning time: 60 seconds Intra-operative Compliance: Compliant   6 cc solution made of 5 cc of 0.2% ropivacaine , 1 cc of Decadron  10 mg/cc.  2 cc injected at each level above on the right   Once the entire procedure was completed, the treated area was cleaned, making sure to leave some of the prepping solution back to take advantage of its long term bactericidal properties.    Illustration of the posterior view of the lumbar spine and the posterior neural structures. Laminae of L2 through S1 are labeled. DPRL5, dorsal primary ramus of L5; DPRS1, dorsal primary ramus of S1; DPR3, dorsal primary ramus of L3; FJ, facet (zygapophyseal) joint L3-L4; I, inferior articular process of L4; LB1, lateral branch of dorsal primary ramus of L1; IAB, inferior  articular branches from L3 medial branch (supplies L4-L5 facet joint); IBP, intermediate branch plexus; MB3, medial branch of dorsal primary ramus of L3; NR3, third lumbar nerve root; S, superior articular process of L5; SAB, superior articular branches from L4 (supplies L4-5 facet joint also); TP3, transverse process of L3.  Vitals:   02/01/24 0905 02/01/24 0915 02/01/24 0925 02/01/24 0935  BP: (!) 141/96 116/66 125/79 129/70  Pulse:      Resp: 18 14 13 16   Temp:      SpO2: 100% 98% 100% 100%  Weight:      Height:        Start Time: 0847 hrs. End Time: 0905 hrs.  Imaging Guidance (Spinal):          Type of Imaging Technique: Fluoroscopy Guidance (Spinal) Indication(s): Assistance in needle guidance and placement for procedures requiring needle placement in or near specific anatomical locations not easily accessible without such assistance. Exposure Time: Please see nurses notes. Contrast: None used. Fluoroscopic Guidance: I was personally present during the use of fluoroscopy. "Tunnel Vision Technique" used to obtain the best possible view of the target area. Parallax error corrected before commencing the procedure. "Direction-depth-direction" technique used to introduce the needle under continuous pulsed fluoroscopy. Once target was reached, antero-posterior, oblique, and lateral fluoroscopic projection used confirm needle placement in all planes. Images permanently stored in EMR. Interpretation: No contrast injected. I personally interpreted the imaging intraoperatively. Adequate needle placement confirmed in multiple planes. Permanent images saved into the patient's record.  Antibiotic Prophylaxis:   Anti-infectives (From admission, onward)    None      Indication(s): None identified  Post-operative Assessment:  Post-procedure Vital Signs:  Pulse/HCG Rate: 6672 Temp: (!) 97.3 F (36.3 C) Resp: 16 BP: 129/70 SpO2: 100 %  EBL: None  Complications: No immediate  post-treatment complications observed by team, or reported by patient.  Note: The patient tolerated the entire procedure well. A repeat set of vitals were taken after the procedure and the patient was kept under observation following institutional  policy, for this type of procedure. Post-procedural neurological assessment was performed, showing return to baseline, prior to discharge. The patient was provided with post-procedure discharge instructions, including a section on how to identify potential problems. Should any problems arise concerning this procedure, the patient was given instructions to immediately contact us , at any time, without hesitation. In any case, we plan to contact the patient by telephone for a follow-up status report regarding this interventional procedure.  Comments:  No additional relevant information.  Plan of Care (POC)  Orders:  Orders Placed This Encounter  Procedures   Radiofrequency,Lumbar    Standing Status:   Future    Expected Date:   02/22/2024    Expiration Date:   05/03/2024    Scheduling Instructions:     Side(s): LEFT     Level(s): L3, L4, L5, Medial Branch Nerve(s)     Sedation: With Sedation- ECT     Scheduling Timeframe: As soon as pre-approved    Where will this procedure be performed?:   [facility]   [facility] C-ARM 1-60 MIN NO REPORT    Intraoperative interpretation by procedural physician at [facility].    Standing Status:   Standing    Number of Occurrences:   1    Reason for exam::   Assistance in needle guidance and placement for procedures requiring needle placement in or near specific anatomical locations not easily accessible without such assistance.    Medications ordered for procedure: Meds ordered this encounter  Medications   lidocaine  (XYLOCAINE ) 2 % (with pres) injection 400 mg   lactated ringers  infusion   midazolam  (VERSED ) 5 MG/5ML injection 0.5-2 mg    Make sure Flumazenil is available in the pyxis  when using this medication. If oversedation occurs, administer 0.2 mg IV over 15 sec. If after 45 sec no response, administer 0.2 mg again over 1 min; may repeat at 1 min intervals; not to exceed 4 doses (1 mg)   fentaNYL  (SUBLIMAZE ) injection 25-50 mcg    Make sure Narcan is available in the pyxis when using this medication. In the event of respiratory depression (RR< 8/min): Titrate NARCAN (naloxone) in increments of 0.1 to 0.2 mg IV at 2-3 minute intervals, until desired degree of reversal.   ropivacaine  (PF) 2 mg/mL (0.2%) (NAROPIN ) injection 18 mL   dexamethasone  (DECADRON ) injection 20 mg   Medications administered: We administered lidocaine , lactated ringers , midazolam , fentaNYL , ropivacaine  (PF) 2 mg/mL (0.2%), and dexamethasone .  See the medical record for exact dosing, route, and time of administration.  Follow-up plan:   Return in about 2 weeks (around 02/15/2024) for left L3,4,5 RFA, ECT.     Recent Visits Date Type Provider Dept  01/12/24 Office Visit Patel, Seema K, NP [facility]  Showing recent visits within past 90 days and meeting all other requirements Today's Visits Date Type Provider Dept  02/01/24 Procedure visit Cephus Collin, MD [facility]  Showing today's visits and meeting all other requirements Future Appointments Date Type Provider Dept  03/29/24 Appointment Cephus Collin, MD [facility]  04/13/24 Appointment Patel, Seema K, NP [facility]  Showing future appointments within next 90 days and meeting all other requirements  Disposition: Discharge home  Discharge (Date  Time): 02/01/2024; 0940 hrs.   Primary Care Physician: Bluford Burkitt, NP Location: [facility] Note by: Cephus Collin, MD Date: 02/01/2024; Time: 10:14 AM  Disclaimer:  Medicine is not an exact science. The only guarantee in  medicine is that nothing is guaranteed. It is important to note that the decision to proceed with this  intervention was based on the information collected from the patient. The Data and conclusions were drawn from the patient's questionnaire, the interview, and the physical examination. Because the information was provided in large part by the patient, it cannot be guaranteed that it has not been purposely or unconsciously manipulated. Every effort has been made to obtain as much relevant data as possible for this evaluation. It is important to note that the conclusions that lead to this procedure are derived in large part from the available data. Always take into account that the treatment will also be dependent on availability of resources and existing treatment guidelines, considered by other Pain Management Practitioners as being common knowledge and practice, at the time of the intervention. For Medico-Legal purposes, it is also important to point out that variation in procedural techniques and pharmacological choices are the acceptable norm. The indications, contraindications, technique, and results of the above procedure should only be interpreted and judged by a Board-Certified Interventional Pain Specialist with extensive familiarity and expertise in the same exact procedure and technique.

## 2024-02-02 ENCOUNTER — Telehealth: Payer: Self-pay

## 2024-02-02 NOTE — Telephone Encounter (Signed)
 Called amy back and she stated she received the new fax with DME and Ofc notes and I informed her of the price Amy stated she will have it approved called and informed pt

## 2024-02-02 NOTE — Telephone Encounter (Signed)
 Copied from CRM [id number]. Topic: General - Other >> Feb 02, 2024  9:19 AM Adonis Hoot wrote: Reason for CRM: Amy from [facility] called in stating that they received a request for a walker for patient.However she said that they did not receive any information as to why that patient needs it,the doctors order,how much it cost ,etc.  Cb#:[phone]

## 2024-02-02 NOTE — Telephone Encounter (Signed)
 Post procedure follow up.  Patient states she is doing well.   ?

## 2024-02-04 ENCOUNTER — Encounter: Admitting: Neurology

## 2024-02-05 ENCOUNTER — Ambulatory Visit: Admitting: Clinical

## 2024-02-05 DIAGNOSIS — F410 Panic disorder [episodic paroxysmal anxiety] without agoraphobia: Secondary | ICD-10-CM

## 2024-02-05 DIAGNOSIS — F32A Depression, unspecified: Secondary | ICD-10-CM

## 2024-02-05 DIAGNOSIS — F411 Generalized anxiety disorder: Secondary | ICD-10-CM | POA: Diagnosis not present

## 2024-02-05 NOTE — Progress Notes (Signed)
   Doree Barthel, LCSW

## 2024-02-05 NOTE — Progress Notes (Signed)
 [facility] Counselor/Therapist Progress Note  Patient ID: Anita Gibbs, MRN: [id number],    Date: 02/05/2024  Time Spent: 2:31pm - 3:20pm : 49 minutes   Treatment Type: Individual Therapy  Reported Symptoms: anxiety  Mental Status Exam: Appearance:  Neat and Well Groomed     Behavior: Appropriate  Motor: Normal  Speech/Language:  Clear and Coherent and Normal Rate  Affect: Appropriate  Mood: anxious  Thought process: normal  Thought content:   WNL  Sensory/Perceptual disturbances:   Patient reported pain in patient's back  Orientation: oriented to person, place, time/date, and situation  Attention: Fair  Concentration: Fair  Memory: WNL  Fund of knowledge:  Good  Insight:   Good  Judgment:  Good  Impulse Control: Good   Risk Assessment: Danger to Self:  No Patient denied current suicidal ideation  Self-injurious Behavior: No Danger to Others: No Patient denied current homicidal ideation Duty to Warn:no Physical Aggression / Violence:No  Access to Firearms a concern: No  Gang Involvement:No   Subjective: Patient reported "feeling antsy" and stated, "I've been feeling off today, I don't know why". Patient stated, "I feel like I've been wanting to cry all day".   Patient identified contractors working on patient's home, changes to patient's home, patient moving her belongings, and moving in with significant other as triggers for anxiety. Patient reported she has decided to move in with patient's significant other and is required to be out of her home by the end of the month. Patient stated, "that's probably why I'm feeling so antsy" in reference to triggers. Patient reported she is currently experiencing pain due to a recent procedure on the nerves in patient's back. Patient stated, "its better" in reference to feelings of anxiety after practicing deep breathing.  Patient reported "its been up and down" in response to anxiety and patient's mood. Patient reported  she recently planted lavender in flower pot and stated, "actually it was relaxing". Patient reported she obtained a bonsai tree and has been pruning the tree. Patient stated, "its been going", "its relaxing, has me thinking about something else" in reference to pruning tree. Patient stated, "ill try" in response to 5 senses exercise. Patient stated,  "I can try it" in response to mindfulness eating exercise. Patient reported experiencing thoughts about patient's family were associated with feelings of anxiety today. Patient stated, "I feel fine" at the conclusion of today's session  Interventions: Cognitive Behavioral Therapy. Clinician conducted session via caregility video from clinician's home office. Patient provided verbal consent to proceed with telehealth session and is aware of limitations of telephone or video visits. Patient participated in session from patient's home. Discussed current feelings of anxiety. Explored and identified triggers for anxiety. Reviewed deep breathing exercise and assisted patient in practicing deep breathing exercise during session. Discussed status of IOP referral and provided contact information for [facility] IOP. Assessed intensity and frequency of anxiety. Reviewed relaxation techniques and coping strategies discussed during previous session and the efficacy of each. Provided psycho education related to mindfulness exercises of the 5 senses and mindful eating.  Explored and identified thoughts associated with feelings of anxiety during session. Assisted patient in replacing negative thoughts with positive thoughts.     Collaboration of Care: Other discussed consent for [facility]   Diagnosis:  Panic disorder   Generalized anxiety disorder   Depression, unspecified depression type     Plan: Patient is to utilize [employer] Therapy, thought re-framing, behavioral activation, relaxation techniques,  mindfulness  and coping strategies to decrease symptoms associated with their diagnosis. Frequency: bi-weekly  Modality: individual      Long-term goal:   Reduce overall level, frequency, and intensity of the feelings of depression, anxiety and panic as evidenced by decreased panic attacks,fear of future panic attacks, lack of  concentration, difficulty falling asleep and staying asleep, muscle tension, feeling on edge, restlessness, and irritability from 7 days/week to 1 to 3 days/week per patient report for at least 3 consecutive months. Target Date: 12/03/24  Progress: progressing    Short-term goal:  Identify and access local resources to increase patient's financial stability Target Date: 06/04/24  Progress: progressing    Decrease feelings of panic/anxiety from 4 to 5 days per week to 1 to 2 days per week by implementing healthy coping strategies, such as, relaxation techniques and mindfulness exercises Target Date: 06/04/24  Progress: progressing    Identify, challenge, and replace negative thought patterns and negative self talk that contribute to feelings of depression and anxiety with positive thoughts, beliefs, and positive self talk per patient's report Target Date: 06/04/24  Progress: progressing    Increase patient's participation in activities patient enjoys from 1 time per week to 3 times per week  Target Date: 06/04/24  Progress: progressing                Anita Carpen, LCSW

## 2024-02-08 ENCOUNTER — Telehealth (HOSPITAL_COMMUNITY): Payer: Self-pay | Admitting: Licensed Clinical Social Worker

## 2024-02-09 ENCOUNTER — Telehealth: Payer: Self-pay

## 2024-02-09 DIAGNOSIS — M3500 Sicca syndrome, unspecified: Secondary | ICD-10-CM | POA: Diagnosis not present

## 2024-02-09 DIAGNOSIS — M47819 Spondylosis without myelopathy or radiculopathy, site unspecified: Secondary | ICD-10-CM | POA: Diagnosis not present

## 2024-02-09 DIAGNOSIS — Z79899 Other long term (current) drug therapy: Secondary | ICD-10-CM | POA: Diagnosis not present

## 2024-02-09 NOTE — Telephone Encounter (Addendum)
 Insurance Treatment Denial Note  Date order was entered:  Order entered by: Cephus Collin, MD Requested treatment: RFA Reason for denial: 80% relief for 4 months missing Recommended for approval: need 4 months relief from previous RFA   She only got relief for 4 days and they want to see 4 months of relief. They denied authorization because of this.  They are offering a P2P. You can call  [phone]. Ref # [id number]

## 2024-02-10 ENCOUNTER — Ambulatory Visit (INDEPENDENT_AMBULATORY_CARE_PROVIDER_SITE_OTHER): Admitting: Internal Medicine

## 2024-02-10 ENCOUNTER — Encounter: Payer: Self-pay | Admitting: Internal Medicine

## 2024-02-10 VITALS — BP 136/80 | HR 68 | Temp 98.6°F | Ht 62.0 in | Wt 242.2 lb

## 2024-02-10 DIAGNOSIS — G4733 Obstructive sleep apnea (adult) (pediatric): Secondary | ICD-10-CM

## 2024-02-10 DIAGNOSIS — G4700 Insomnia, unspecified: Secondary | ICD-10-CM

## 2024-02-10 DIAGNOSIS — E669 Obesity, unspecified: Secondary | ICD-10-CM | POA: Diagnosis not present

## 2024-02-10 DIAGNOSIS — Z6841 Body Mass Index (BMI) 40.0 and over, adult: Secondary | ICD-10-CM | POA: Diagnosis not present

## 2024-02-10 DIAGNOSIS — R5381 Other malaise: Secondary | ICD-10-CM | POA: Diagnosis not present

## 2024-02-10 DIAGNOSIS — F5101 Primary insomnia: Secondary | ICD-10-CM

## 2024-02-10 MED ORDER — MELATONIN 10 MG PO CAPS
10.0000 mg | ORAL_CAPSULE | Freq: Every day | ORAL | 6 refills | Status: AC
Start: 2024-02-10 — End: ?

## 2024-02-10 NOTE — Progress Notes (Signed)
 Name: Anita Gibbs MRN: [id number] DOB: 14-Aug-1971    CHIEF COMPLAINT:  EXCESSIVE DAYTIME SLEEPINESS Assessment of insomnia and sleep apnea   HISTORY OF PRESENT ILLNESS: Patient is seen today for problems and issues with sleep related to excessive daytime sleepiness Patient  has been having sleep problems for many years Patient has been having excessive daytime sleepiness for a long time Patient has been having extreme fatigue and tiredness, lack of energy +  very Loud snoring every night + Nonrefreshing sleep  Discussed sleep data and reviewed with patient.  Encouraged proper weight management.  Discussed driving precautions and its relationship with hypersomnolence.  Discussed operating dangerous equipment and its relationship with hypersomnolence.  Discussed sleep hygiene, and benefits of a fixed sleep waked time.  The importance of getting eight or more hours of sleep discussed with patient.  Discussed limiting the use of the computer and television before bedtime.  Decrease naps during the day, so night time sleep will become enhanced.  Limit caffeine , and sleep deprivation.  HTN, stroke, and heart failure are potential risk factors.      02/10/2024    1:00 PM 03/06/2020    9:00 AM  Results of the Epworth flowsheet  Sitting and reading 3 3  Watching TV 3 3  Sitting, inactive in a public place (e.g. a theatre or a meeting) 1 2  As a passenger in a car for an hour without a break 2 2  Lying down to rest in the afternoon when circumstances permit 1 3  Sitting and talking to someone 1 2  Sitting quietly after a lunch without alcohol 2 2  In a car, while stopped for a few minutes in traffic 0 1  Total score 13 18   Patient with very complex sleep patterns Sleep 6 AM to 11 AM Patient has a hard time falling asleep Patient has very poor sleep hygiene Patient is willing to try melatonin She has chronic pain syndrome and chronic back pain She does have Sjogren's  syndrome Sees rheumatology at [facility]     PAST MEDICAL HISTORY :   has a past medical history of Abdominal pain (06/11/2020), Abnormal drug screen (03/06/2020), Abnormal MRI, cervical spine (06/10/2018) (09/27/2018), Acute cystitis without hematuria (06/18/2020), Acute vaginitis (06/11/2020), 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), Adrenal nodule (HCC) (06/20/2020), Allergy , ANA positive (02/01/2021), Anxiety and depression (01/29/2021), Arthritis, Back pain with history of spinal surgery (01/29/2021), Bilateral carpal tunnel syndrome (01/29/2021), Bilateral leg edema (01/29/2021), BMI 40.0-44.9, adult (HCC) (04/30/2022), Carpal boss, right, Carpal tunnel syndrome, bilateral, 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) (09/27/2018), Cervical facet hypertrophy (C3-T1) (09/27/2018), Cervical facet joint syndrome (Bilateral) (L>R) (09/27/2018), 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-5, C5-6, and C6-7) (09/27/2018), Cervical myelopathy (HCC) (07/01/2017), Cervicalgia (Primary Area of Pain) (Bilateral) (L>R) (09/27/2018), Chronic gout of foot (Left) (05/31/2018), Chronic low back pain (Tertiary Area of Pain) (Bilateral) (R>L) w/ sciatica (Bilateral) (09/07/2018), Chronic low back pain (Tertiary Area of Pain) (Bilateral) (R>L) w/o sciatica (07/26/2018), Chronic lower extremity pain (Fourth Area of Pain) (Bilateral) (R>L) (09/07/2018), Chronic musculoskeletal pain (10/27/2018), Chronic neck pain (Bilateral) w/ history of cervical spinal surgery (09/27/2018), Chronic neck pain (Primary Area of Pain) (Bilateral) (L>R) (09/07/2018), Chronic sacroiliac joint dysfunction (Bilateral) (09/27/2018), Chronic sacroiliac joint pain (Right) (09/07/2018), Chronic upper extremity pain (Secondary Area of Pain) (Bilateral) (R>L) (09/27/2018), Disorder of skeletal system (09/07/2018), E. coli UTI (urinary tract infection) (06/20/2020), Elevated sed rate (09/08/2018), Elevated serum creatinine (06/20/2020),  Excessive daytime sleepiness (03/06/2020), Female pelvic pain (09/05/2020),  GERD (gastroesophageal reflux disease), Headache, History of allergy  to shellfish (09/28/2018), History of fusion of cervical spine (ACDF C4-C7) (09/27/2018), History of illicit drug use (03/07/2020), Hives (09/05/2020), Hives (09/05/2020), Hyperkalemia (05/31/2018), Hyperlipidemia (04/30/2022), Hypertension, Large breasts (05/30/2020), Left hip pain (07/21/2022), Lumbar facet arthropathy (04/20/2019), Lumbar radiculitis (L5 dermatome) (Right) (04/17/2020), Multiple environmental allergies (01/17/2020), Multiple food allergies (03/07/2020), Neurogenic pain (10/27/2018), Numbness and tingling of both feet (05/31/2018), 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), Other intervertebral disc degeneration, 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), Pharmacologic therapy (09/07/2018), Plantar fasciitis, bilateral (03/04/2021), Polyneuropathy (01/29/2021), Prediabetes (10/10/2019), Problems influencing health status (09/07/2018), Rash (08/08/2019), Seasonal allergic rhinitis due to pollen (11/29/2018), Seasonal allergies (03/06/2020), Seasonal asthma (11/29/2018), Sleep apnea, Somatic dysfunction of sacroiliac joint (Bilateral) (09/27/2018), Spondylosis without myelopathy or radiculopathy, cervical region (03/22/2019), Spondylosis without myelopathy or radiculopathy, lumbosacral region (05/19/2019), Spondylosis, cervical, 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  has a past surgical history that includes Knee arthroscopy (Right); Anterior cervical decomp/discectomy fusion (N/A, 07/01/2017); Abdominal hysterectomy (10/17/2020); and Carpal tunnel release (Bilateral). Prior to Admission medications   Medication Sig Start Date End Date Taking? Authorizing Provider  albuterol  (VENTOLIN  HFA) 108 (90 Base) MCG/ACT inhaler Inhale 2 puffs into the lungs every 6 (six) hours as needed for wheezing or shortness of breath. 10/23/23    Bluford Burkitt, NP  amLODipine  (NORVASC ) 5 MG tablet Take 1 tablet (5 mg total) by mouth daily. 11/09/23   Bluford Burkitt, NP  atorvastatin  (LIPITOR) 10 MG tablet MWF at night 04/08/23   Valli Gaw, MD  buprenorphine  (BUTRANS ) 15 MCG/HR Place 1 patch onto the skin once a week. 01/12/24   Patel, Seema K, NP  busPIRone  (BUSPAR ) 10 MG tablet Take 2 tablets (20 mg total) by mouth 2 (two) times daily. 01/18/24   Eappen, Saramma, MD  butalbital -acetaminophen -caffeine  (FIORICET) 50-325-40 MG tablet Take 1 tablet by mouth every 6 (six) hours as needed for headache. 01/05/24   Bluford Burkitt, NP  cetirizine  (ZYRTEC  ALLERGY ) 10 MG tablet Take 1 tablet (10 mg total) by mouth at bedtime as needed. 04/08/23   Valli Gaw, MD  Cholecalciferol (VITAMIN D -3) 125 MCG (5000 UT) TABS Take by mouth daily.    [provider]  Cyanocobalamin  (B-12 PO) Take by mouth.    [provider]  dicyclomine  (BENTYL ) 20 MG tablet TAKE 1 TABLET BY MOUTH FOUR TIMES DAILY (BEFORE MEALS AND AT BEDTIME) 01/28/24   Bluford Burkitt, NP  DULoxetine  (CYMBALTA ) 60 MG capsule Take 1 capsule (60 mg total) by mouth 2 (two) times daily. 10/23/23   Bluford Burkitt, NP  EPINEPHrine  (AUVI-Q ) 0.3 mg/0.3 mL IJ SOAJ injection Use as directed for severe allergic reaction 08/19/18   Kozlow, Rema Care, MD  fluconazole  (DIFLUCAN ) 150 MG tablet Take 1 tablet by mouth x 1 dose. May repeat in 72 hours if needed. 08/31/23   Bluford Burkitt, NP  fluticasone  (FLONASE ) 50 MCG/ACT nasal spray Place 2 sprays into both nostrils daily. 04/08/23   Valli Gaw, MD  hydrocortisone  2.5 % cream Apply topically 2 (two) times daily. Prn to eyelids 04/08/23   Valli Gaw, MD  hydrOXYzine  (VISTARIL ) 25 MG capsule Take 1 capsule (25 mg total) by mouth 3 (three) times daily. 11/09/23   Lester, Kacy, NP  ibuprofen (ADVIL) 800 MG tablet Take 800 mg by mouth 3 (three) times daily. 10/28/23   [provider]  losartan  (COZAAR ) 100 MG tablet Take 1 tablet (100 mg total) by mouth  daily. 04/08/23   Valli Gaw, MD  meloxicam  (MOBIC ) 15 MG tablet Take 1 tablet (  15 mg total) by mouth every other day as needed. 12/24/21   Lateef, Bilal, MD  methocarbamol  (ROBAXIN ) 750 MG tablet Take 1 tablet (750 mg total) by mouth every 8 (eight) hours as needed for muscle spasms. 01/12/24 01/06/25  Patel, Seema K, NP  montelukast  (SINGULAIR ) 10 MG tablet Take 1 tablet (10 mg total) by mouth at bedtime. 04/08/23   Valli Gaw, MD  pantoprazole  (PROTONIX ) 40 MG tablet TAKE ONE TABLET EVERY DAY 30 MIN BEFORE DINNER 04/08/23   Valli Gaw, MD  polyethylene glycol powder (GLYCOLAX /MIRALAX ) 17 GM/SCOOP powder Take 17 g by mouth 2 (two) times daily as needed. 08/05/22   Valli Gaw, MD  potassium chloride  SA (KLOR-CON  M) 20 MEQ tablet Take 1 tablet (20 mEq total) by mouth daily as needed. With lasix  04/08/23   Valli Gaw, MD  pregabalin  (LYRICA ) 75 MG capsule Take 1 capsule (75 mg total) by mouth 3 (three) times daily. 08/04/23   Cephus Collin, MD  saccharomyces boulardii (FLORASTOR) 250 MG capsule Take 1 capsule (250 mg total) by mouth daily. 04/08/23   Valli Gaw, MD  triamcinolone  cream (KENALOG ) 0.1 % Apply 1 Application topically 2 (two) times daily. 11/24/23   Bluford Burkitt, NP   Allergies  Allergen Reactions   Other Hives    White and red sauces   Pineapple     Swelling    Shellfish Allergy  Hives   Strawberry (Diagnostic) Hives   Tomato Hives    cherry    FAMILY HISTORY:  family history includes [name] in her mother; Arthritis in her father, mother, and sister; Breast cancer in her maternal aunt and paternal aunt; COPD in her brother and son; Cancer in her brother; Depression in her son; Diabetes in her mother; Early death in her brother; Hearing loss in her father; Hyperlipidemia in her mother; Hypertension in her father, mother, and sister; Kidney disease in her father; Lymphoma in her father; Post-traumatic stress disorder in her brother and son; Sleep apnea in her daughter. SOCIAL  HISTORY:  reports that she has never smoked. She has never used smokeless tobacco. She reports current alcohol use of about 2.0 standard drinks of alcohol per week. She reports that she does not use drugs.   BP 136/80 (BP Location: Right Arm, Patient Position: Sitting, Cuff Size: Large)   Pulse 68   Temp 98.6 F (37 C) (Oral)   Ht 5' 2 (1.575 m)   Wt 242 lb 3.2 oz (109.9 kg)   LMP  (LMP Unknown)   SpO2 98%   BMI 44.30 kg/m     Review of Systems: Gen:  Denies  fever, sweats, chills weight loss  HEENT: Denies blurred vision, double vision, ear pain, eye pain, hearing loss, nose bleeds, sore throat Cardiac:  No dizziness, chest pain or heaviness, chest tightness,edema, No JVD Resp:   No cough, -sputum production, -shortness of breath,-wheezing, -hemoptysis,  Other:  All other systems negative   Physical Examination:   General Appearance: No distress  EYES PERRLA, EOM intact.   NECK Supple, No JVD Pulmonary: normal breath sounds, No wheezing.  CardiovascularNormal S1,S2.  No m/r/g.   Abdomen: Benign, Soft, non-tender. Neurology UE/LE 5/5 strength, no focal deficits Ext pulses intact, cap refill intact ALL OTHER ROS ARE NEGATIVE     ASSESSMENT AND PLAN SYNOPSIS  Patient with signs and symptoms of excessive daytime sleepiness with probable underlying diagnosis of obstructive sleep apnea in the setting of obesity and deconditioned state Patient also with complex sleep pattern and severe insomnia  Recommend Home Sleep Study for definitve diagnosis  Obesity -recommend significant weight loss -recommend changing diet  Deconditioned state -Recommend increased daily activity and exercise  Complex sleep pattern and insomnia Recommend starting melatonin 10 mg at night Recommend referral to Dr. Kieran Pellet for complex sleep and severe insomnia   MEDICATION ADJUSTMENTS/LABS AND TESTS ORDERED: Recommend Sleep Study Recommend weight loss Recommend referral to Dr. Kieran Pellet for  complex insomnia   CURRENT MEDICATIONS REVIEWED AT LENGTH WITH PATIENT TODAY   Patient  satisfied with Plan of action and management. All questions answered  Follow up  3 months   I spent a total of  65 minutes reviewing chart data, face-to-face evaluation with the patient, counseling and coordination of care as detailed above.    Lady Pier, M.D.  [facility]  Medical Director [facility] Medical Director [facility] Cardio-Pulmonary Department

## 2024-02-10 NOTE — Patient Instructions (Addendum)
 Recommend Home Sleep Study to assess for sleep apnea Recommend starting Melatonin 10 mg at Night for Insomnia  Recommend Referral to Dr Kieran Pellet for complex sleep, insomnia

## 2024-02-11 ENCOUNTER — Telehealth (HOSPITAL_COMMUNITY): Payer: Self-pay | Admitting: Licensed Clinical Social Worker

## 2024-02-11 DIAGNOSIS — Z419 Encounter for procedure for purposes other than remedying health state, unspecified: Secondary | ICD-10-CM | POA: Diagnosis not present

## 2024-02-11 NOTE — Telephone Encounter (Signed)
 See call intake

## 2024-02-12 ENCOUNTER — Telehealth: Admitting: Psychiatry

## 2024-02-15 DIAGNOSIS — M47816 Spondylosis without myelopathy or radiculopathy, lumbar region: Secondary | ICD-10-CM | POA: Diagnosis not present

## 2024-02-15 DIAGNOSIS — G8929 Other chronic pain: Secondary | ICD-10-CM | POA: Diagnosis not present

## 2024-02-15 DIAGNOSIS — M5459 Other low back pain: Secondary | ICD-10-CM | POA: Diagnosis not present

## 2024-02-15 DIAGNOSIS — R262 Difficulty in walking, not elsewhere classified: Secondary | ICD-10-CM | POA: Diagnosis not present

## 2024-02-18 ENCOUNTER — Ambulatory Visit (INDEPENDENT_AMBULATORY_CARE_PROVIDER_SITE_OTHER)

## 2024-02-18 DIAGNOSIS — F411 Generalized anxiety disorder: Secondary | ICD-10-CM

## 2024-02-18 NOTE — Psych (Signed)
 Virtual Visit via Video Note  I connected with Anita Gibbs on 02/18/24 at  1:00 PM EDT by a video enabled telemedicine application and verified that I am speaking with the correct person using two identifiers.  Location: Patient: Home Provider: Clinical Home Office   I discussed the limitations of evaluation and management by telemedicine and the availability of in person appointments. The patient expressed understanding and agreed to proceed.  Follow Up Instructions:    I discussed the assessment and treatment plan with the patient. The patient was provided an opportunity to ask questions and all were answered. The patient agreed with the plan and demonstrated an understanding of the instructions.   The patient was advised to call back or seek an in-person evaluation if the symptoms worsen or if the condition fails to improve as anticipated.  I provided 15 minutes of non-face-to-face time during this encounter.   Buford Carnes, LCMHC  Cln met with pt to complete a CCA for PHP. Cln orients. Pt reports she would have to miss a number of days in the next few weeks because of other doctor appointments. Cln discusses attendance policy. Cln and pt discuss delaying PHP CCA until pt is closer to when she would be able to participate in group. Pt is rescheduled. Pt denies SI/HI

## 2024-02-19 ENCOUNTER — Ambulatory Visit: Admitting: Clinical

## 2024-02-23 ENCOUNTER — Ambulatory Visit: Payer: 59 | Admitting: Internal Medicine

## 2024-02-23 ENCOUNTER — Encounter: Payer: Self-pay | Admitting: Internal Medicine

## 2024-02-23 VITALS — BP 120/80 | HR 62 | Ht 62.0 in | Wt 248.0 lb

## 2024-02-23 DIAGNOSIS — D3502 Benign neoplasm of left adrenal gland: Secondary | ICD-10-CM

## 2024-02-23 DIAGNOSIS — M47819 Spondylosis without myelopathy or radiculopathy, site unspecified: Secondary | ICD-10-CM | POA: Diagnosis not present

## 2024-02-23 MED ORDER — DEXAMETHASONE 1 MG PO TABS: 1.0000 mg | ORAL_TABLET | Freq: Once | ORAL | 0 refills | Status: AC

## 2024-02-23 NOTE — Patient Instructions (Signed)
   Instructions for Dexamethasone Suppression Test   Step 1: Choose a morning when you can come to our lab at 8:00 am for a blood draw.   Step 2: On the night before the blood draw, take one 1 mg tablet of dexamethasone at 11:30 pm.  The timing is VERY important!   Step 3: The next morning, go to the lab for blood work at 8:00 am.  Bonita Quin do not have to be on an empty stomach, but the timing is VERY important!

## 2024-02-23 NOTE — Progress Notes (Signed)
 Name: Anita Gibbs  MRN/ DOB: [id number], 1971/02/24    Age/ Sex: 53 y.o., female     PCP: Anita App, NP   Reason for Endocrinology Evaluation: Left adrenal adenoma      Initial Endocrinology Clinic Visit: 07/31/2020    PATIENT IDENTIFIER: Anita Gibbs is a 53 y.o., female with a past medical history of HTN and dyslipidemia . She has followed with [facility] since 07/31/2020 for consultative assistance with management of her Left adrenal adenoma .   HISTORICAL SUMMARY:   She had an incidental left adrenal adenoma on a CT scan in 06/2020 during evaluation for abdominal pain that measures 1.4 x 1.2 cm and 48 Hounsfield units.   No family history of adrenal disease   Mother and sister with DM  SUBJECTIVE:     Today (02/23/2024):  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  Posterior Fossa, vertebral arteries, paraspinal tissues: A relatively empty sella present.     Disc levels:  C3-4: Negative. A mild broad-based disc osteophyte complex present. Uncovertebral spurring contributes to mild foraminal narrowing bilaterally.  C4-5: A leftward disc  osteophyte complex is present. Uncovertebral and facet disease   Acute cystitis without hematuria 06/18/2020   Acute vaginitis 06/11/2020   Adenoma of left adrenal gland 08/01/2020   Adrenal nodule (HCC) 06/20/2020   Allergy     ANA positive 02/01/2021   Anxiety and depression 01/29/2021   Arthritis    Back pain with history of spinal surgery 01/29/2021   Bilateral carpal tunnel syndrome 01/29/2021   Bilateral leg edema 01/29/2021   BMI 40.0-44.9, adult (HCC) 04/30/2022   Carpal boss, right    Carpal tunnel syndrome, bilateral    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) 09/27/2018   Levels:  C4-5: There is partial effacement of ventral CSF.     IMPRESSION:  Mild residual central canal narrowing at C4-5 s/p ACDF.   Cervical facet hypertrophy (C3-T1) 09/27/2018   Levels:  C3-4: Uncovertebral spurring  C4-5: Uncovertebral and facet disease  C5-6: Residual uncovertebral disease.  C6-7: Residual uncovertebral disease.  C7-T1: Minimal left-sided uncovertebral spurring   Cervical facet joint syndrome (Bilateral) (L>R) 09/27/2018   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-5, C5-6, and C6-7) 09/27/2018   Levels:  C3-4: Mild foraminal narrowing bilaterally.  C4-5: Mild left foraminal narrowing.  C5-6: Mild left foraminal narrowing is due to residual uncovertebral disease.  C6-7: Mild left foraminal narrowing is due to residual uncovertebral disease.   Cervical myelopathy (HCC) 07/01/2017   Cervicalgia (Primary Area of Pain) (Bilateral) (L>R) 09/27/2018   Chronic gout of foot (Left) 05/31/2018   Chronic low back pain [facility] Area of Pain) (Bilateral) (R>L) w/ sciatica (Bilateral) 09/07/2018  Chronic low back pain [facility] Area of Pain) (Bilateral) (R>L) w/o sciatica 07/26/2018   Chronic lower extremity pain (Fourth Area of Pain) (Bilateral) (R>L) 09/07/2018   Chronic musculoskeletal pain 10/27/2018   Chronic neck pain (Bilateral) w/ history of cervical spinal surgery 09/27/2018    Chronic neck pain (Primary Area of Pain) (Bilateral) (L>R) 09/07/2018   Chronic sacroiliac joint dysfunction (Bilateral) 09/27/2018   Chronic sacroiliac joint pain (Right) 09/07/2018   Chronic upper extremity pain (Secondary Area of Pain) (Bilateral) (R>L) 09/27/2018   Disorder of skeletal system 09/07/2018   E. coli UTI (urinary tract infection) 06/20/2020   Elevated sed rate 09/08/2018   Elevated serum creatinine 06/20/2020   Excessive daytime sleepiness 03/06/2020   Female pelvic pain 09/05/2020   GERD (gastroesophageal reflux disease)    Headache    History of allergy  to shellfish 09/28/2018   History of fusion of cervical spine (ACDF C4-C7) 09/27/2018   History of illicit drug use 03/07/2020   Hives 09/05/2020   Hives 09/05/2020   Hyperkalemia 05/31/2018   Mild   Hyperlipidemia 04/30/2022   Hypertension    Large breasts 05/30/2020   Left hip pain 07/21/2022   Lumbar facet arthropathy 04/20/2019   Lumbar radiculitis (L5 dermatome) (Right) 04/17/2020   Multiple environmental allergies 01/17/2020   Multiple food allergies 03/07/2020   Neurogenic pain 10/27/2018   Numbness and tingling of both feet 05/31/2018   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   Other intervertebral disc degeneration, lumbar region 11/15/2019   Pharmacologic therapy 09/07/2018   Plantar fasciitis, bilateral 03/04/2021   Polyneuropathy 01/29/2021   Prediabetes 10/10/2019   Problems influencing health status 09/07/2018   Rash 08/08/2019   Seasonal allergic rhinitis due to pollen 11/29/2018   Seasonal allergies 03/06/2020   Seasonal asthma 11/29/2018   Sleep apnea    Somatic dysfunction of sacroiliac joint (Bilateral) 09/27/2018   Spondylosis without myelopathy or radiculopathy, cervical region 03/22/2019   Spondylosis without myelopathy or radiculopathy, lumbosacral region 05/19/2019   Spondylosis, cervical, w/ myelopathy 09/27/2018   Vaginal discharge 06/11/2020   Past Surgical  History:  Past Surgical History:  Procedure Laterality Date   ABDOMINAL HYSTERECTOMY  10/17/2020   [facility]; she has both ovaries   ANTERIOR CERVICAL DECOMP/DISCECTOMY FUSION N/A 07/01/2017   Procedure: ANTERIOR CERVICAL DECOMPRESSION/DISCECTOMY FUSION 3 LEVELS;  Surgeon: Clois Fret, MD;  Location: [facility] ORS;  Service: Neurosurgery;  Laterality: N/A;   CARPAL TUNNEL RELEASE Bilateral    KNEE ARTHROSCOPY Right    Social History:  reports that she has never smoked. She has never used smokeless tobacco. She reports current alcohol use of about 2.0 standard drinks of alcohol per week. She reports that she does not use drugs. Family History:  Family History  Problem Relation Age of Onset   [name] Mother    Diabetes Mother    Arthritis Mother    Hypertension Mother    Hyperlipidemia Mother    Hypertension Father    Arthritis Father    Hearing loss Father    Kidney disease Father    Lymphoma Father        large b cell    Arthritis Sister    Hypertension Sister    Cancer Brother        ? lung due to exposure    COPD Brother    Early death Brother    Post-traumatic stress disorder Brother    Breast cancer Maternal Aunt    Breast cancer Paternal Aunt    Sleep apnea Daughter  COPD Son    Depression Son    Post-traumatic stress disorder Son    Allergic rhinitis Neg Hx    Angioedema Neg Hx    Eczema Neg Hx    Urticaria Neg Hx    Asthma Neg Hx      HOME MEDICATIONS: Allergies as of 02/23/2024       Reactions   Other Hives   White and red sauces   Pineapple    Swelling   Shellfish Allergy  Hives   Strawberry (diagnostic) Hives   Tomato Hives   cherry        Medication List        Accurate as of February 23, 2024  8:11 AM. If you have any questions, ask your nurse or doctor.          albuterol  108 (90 Base) MCG/ACT inhaler Commonly known as: VENTOLIN  HFA Inhale 2 puffs into the lungs every 6 (six) hours as needed for wheezing or shortness of breath.    amLODipine  5 MG tablet Commonly known as: NORVASC  Take 1 tablet (5 mg total) by mouth daily.   atorvastatin  10 MG tablet Commonly known as: LIPITOR MWF at night   B-12 PO Take by mouth.   buprenorphine  15 MCG/HR Commonly known as: BUTRANS  Place 1 patch onto the skin once a week.   busPIRone  10 MG tablet Commonly known as: BUSPAR  Take 2 tablets (20 mg total) by mouth 2 (two) times daily.   butalbital -acetaminophen -caffeine  50-325-40 MG tablet Commonly known as: FIORICET Take 1 tablet by mouth every 6 (six) hours as needed for headache.   cetirizine  10 MG tablet Commonly known as: ZyrTEC  Allergy  Take 1 tablet (10 mg total) by mouth at bedtime as needed.   dicyclomine  20 MG tablet Commonly known as: BENTYL  TAKE 1 TABLET BY MOUTH FOUR TIMES DAILY (BEFORE MEALS AND AT BEDTIME)   DULoxetine  60 MG capsule Commonly known as: Cymbalta  Take 1 capsule (60 mg total) by mouth 2 (two) times daily.   EPINEPHrine  0.3 mg/0.3 mL Soaj injection Commonly known as: Auvi-Q  Use as directed for severe allergic reaction   fluconazole  150 MG tablet Commonly known as: DIFLUCAN  Take 1 tablet by mouth x 1 dose. May repeat in 72 hours if needed.   fluticasone  50 MCG/ACT nasal spray Commonly known as: FLONASE  Place 2 sprays into both nostrils daily.   hydrocortisone  2.5 % cream Apply topically 2 (two) times daily. Prn to eyelids   hydrOXYzine  25 MG capsule Commonly known as: VISTARIL  Take 1 capsule (25 mg total) by mouth 3 (three) times daily.   ibuprofen 800 MG tablet Commonly known as: ADVIL Take 800 mg by mouth 3 (three) times daily.   losartan  100 MG tablet Commonly known as: COZAAR  Take 1 tablet (100 mg total) by mouth daily.   Melatonin 10 MG Caps Take 10 mg by mouth at bedtime.   meloxicam  15 MG tablet Commonly known as: MOBIC  Take 1 tablet (15 mg total) by mouth every other day as needed.   methocarbamol  750 MG tablet Commonly known as: ROBAXIN  Take 1 tablet (750 mg  total) by mouth every 8 (eight) hours as needed for muscle spasms.   montelukast  10 MG tablet Commonly known as: SINGULAIR  Take 1 tablet (10 mg total) by mouth at bedtime.   pantoprazole  40 MG tablet Commonly known as: PROTONIX  TAKE ONE TABLET EVERY DAY 30 MIN BEFORE DINNER   polyethylene glycol powder 17 GM/SCOOP powder Commonly known as: GLYCOLAX /MIRALAX  Take 17 g by mouth 2 (two)  times daily as needed.   potassium chloride  SA 20 MEQ tablet Commonly known as: KLOR-CON  M Take 1 tablet (20 mEq total) by mouth daily as needed. With lasix    pregabalin  75 MG capsule Commonly known as: Lyrica  Take 1 capsule (75 mg total) by mouth 3 (three) times daily.   saccharomyces boulardii 250 MG capsule Commonly known as: Florastor Take 1 capsule (250 mg total) by mouth daily.   triamcinolone  cream 0.1 % Commonly known as: KENALOG  Apply 1 Application topically 2 (two) times daily.   Vitamin D -3 125 MCG (5000 UT) Tabs Take by mouth daily.          OBJECTIVE:   PHYSICAL EXAM: VS: BP 120/80 (BP Location: Left Arm, Patient Position: Sitting, Cuff Size: Normal)   Pulse 62   Ht 5' 2 (1.575 m)   Wt 248 lb (112.5 kg)   LMP  (LMP Unknown)   SpO2 99%   BMI 45.36 kg/m    EXAM: General: Pt appears well and is in NAD  Neck: General: Supple without adenopathy. Thyroid : Thyroid  size normal.  No goiter or nodules appreciated.   Lungs: Clear with good BS bilat   Heart: Auscultation: RRR.  Abdomen: soft, nontender  Extremities:  BL LE: No pretibial edema   Mental Status: Judgment, insight: Intact Orientation: Oriented to time, place, and person Memory: Intact for recent and remote events Mood and affect: No depression, anxiety, or agitation     DATA REVIEWED:   Latest Reference Range & Units 02/23/24 08:30  Potassium 3.5 - 5.3 mmol/L 4.3    Latest Reference Range & Units 11/18/23 00:58  Sodium 135 - 145 mmol/L 138  Potassium 3.5 - 5.1 mmol/L 3.6  Chloride 98 - 111 mmol/L  105  CO2 22 - 32 mmol/L 26  Glucose 70 - 99 mg/dL 896 (H)  BUN 6 - 20 mg/dL 15  Creatinine 9.55 - 8.99 mg/dL 9.24  Calcium  8.9 - 10.3 mg/dL 8.8 (L)  Anion gap 5 - 15  7  Alkaline Phosphatase 38 - 126 U/L 45  Albumin 3.5 - 5.0 g/dL 3.9  AST 15 - 41 U/L 12 (L)  ALT 0 - 44 U/L 12  Total Protein 6.5 - 8.1 g/dL 7.5  Total Bilirubin 0.0 - 1.2 mg/dL 0.6  GFR, Estimated >39 mL/min >60    CT abdomen 06/19/2021  Adrenals/Urinary Tract: Low-density nodule in the left adrenal gland measures 1.3 x 1.4 cm and previously measured 1.4 x 1.2 cm. Hounsfield units are less than 10. This left adrenal nodule is compatible with a benign adenoma. Normal right adrenal gland. Normal appearance of both kidneys. No hydronephrosis.     ASSESSMENT / PLAN / RECOMMENDATIONS:   Left adrenal adenoma    -We had screened her for hyperaldosteronemia as well as Cushing syndrome and pheo  in 2021, 2022 and cushing syndrome and hyperaldo in 2023 -Imaging  for left adrenal adenoma  shows stability x 2 (2021 and 2022)  - Potassium is normal, aldo, renin and catecholamine pending - pt will return for dexamethasone  suppression test      Follow-up in 1 year   Signed electronically by: Stefano Redgie Butts, MD  [facility]  [facility] [address] [place], [place] [address] Phone: [phone] FAX: [phone]      CC: Anita App, NP [address] [place] [address] Phone: [phone]  Fax: [phone]   Return to Endocrinology clinic as below: Future Appointments  Date Time Provider Department  Center  02/23/2024  8:30 AM Gwenetta Devos, Donell Cardinal, MD [facility] None  02/25/2024  8:30 AM Eappen, Saramma, MD [facility] None  03/01/2024 10:00 AM [facility] THERAPIST [facility] None  03/10/2024  8:00 AM Tobie Breslow K, DO [facility] None  03/16/2024  1:30 PM Hilma Hastings, PA-C [facility] None  04/07/2024  8:00 AM Patel, Seema K, NP [facility] None

## 2024-02-24 DIAGNOSIS — H524 Presbyopia: Secondary | ICD-10-CM | POA: Diagnosis not present

## 2024-02-25 ENCOUNTER — Encounter: Payer: Self-pay | Admitting: Student in an Organized Health Care Education/Training Program

## 2024-02-25 ENCOUNTER — Telehealth: Admitting: Psychiatry

## 2024-02-25 ENCOUNTER — Encounter: Payer: Self-pay | Admitting: Psychiatry

## 2024-02-25 ENCOUNTER — Encounter: Payer: Self-pay | Admitting: Internal Medicine

## 2024-02-25 DIAGNOSIS — G4700 Insomnia, unspecified: Secondary | ICD-10-CM | POA: Diagnosis not present

## 2024-02-25 DIAGNOSIS — G894 Chronic pain syndrome: Secondary | ICD-10-CM

## 2024-02-25 DIAGNOSIS — M792 Neuralgia and neuritis, unspecified: Secondary | ICD-10-CM

## 2024-02-25 DIAGNOSIS — F331 Major depressive disorder, recurrent, moderate: Secondary | ICD-10-CM | POA: Diagnosis not present

## 2024-02-25 DIAGNOSIS — F411 Generalized anxiety disorder: Secondary | ICD-10-CM | POA: Diagnosis not present

## 2024-02-25 MED ORDER — PREGABALIN 100 MG PO CAPS: 100.0000 mg | ORAL_CAPSULE | Freq: Three times a day (TID) | ORAL | 0 refills | Status: DC

## 2024-02-25 MED ORDER — TRAZODONE HCL 50 MG PO TABS: 50.0000 mg | ORAL_TABLET | Freq: Every evening | ORAL | 1 refills | Status: DC | PRN

## 2024-02-25 NOTE — Patient Instructions (Signed)
 Trazodone Tablets What is this medication? TRAZODONE (TRAZ oh done) treats depression. It increases the amount of serotonin in the brain, a hormone that helps regulate mood. This medicine may be used for other purposes; ask your health care provider or pharmacist if you have questions. COMMON BRAND NAME(S): Desyrel What should I tell my care team before I take this medication? They need to know if you have any of these conditions: Bipolar disorder Bleeding disorder Glaucoma Heart disease, or previous heart attack Irregular heartbeat or rhythm Kidney disease Liver disease Low levels of sodium in the blood Suicidal thoughts, plans, or attempt by you or a family member An unusual or allergic reaction to trazodone, other medications, foods, dyes, or preservatives Pregnant or trying to get pregnant Breastfeeding How should I use this medication? Take this medication by mouth with a glass of water. Take it as directed on the prescription label at the same time every day. Take this medication shortly after a meal or a light snack. Keep taking this medication unless your care team tells you to stop. Stopping it too quickly can cause serious side effects. It can also make your condition worse. A special MedGuide will be given to you by the pharmacist with each prescription and refill. Be sure to read this information carefully each time. Talk to your care team about the use of this medication in children. Special care may be needed. Overdosage: If you think you have taken too much of this medicine contact a poison control center or emergency room at once. NOTE: This medicine is only for you. Do not share this medicine with others. What if I miss a dose? If you miss a dose, take it as soon as you can. If it is almost time for your next dose, take only that dose. Do not take double or extra doses. What may interact with this medication? Do not take this medication with any of the following: Certain  medications for fungal infections, such as fluconazole, itraconazole, ketoconazole, posaconazole, voriconazole Cisapride Dronedarone Linezolid MAOIs, such as Carbex, Eldepryl, Marplan, Nardil, and Parnate Mesoridazine Methylene blue (injected into a vein) Pimozide Saquinavir Thioridazine This medication may also interact with the following: Alcohol Antiviral medications for HIV or AIDS Aspirin and aspirin-like medications Barbiturates, such as phenobarbital Certain medications for blood pressure, heart disease, irregular heart beat Certain medications for mental health conditions Certain medications for migraine headache, such as almotriptan, eletriptan, frovatriptan, naratriptan, rizatriptan, sumatriptan, zolmitriptan Certain medications for seizures, such as carbamazepine and phenytoin Certain medications for sleep Certain medications that treat or prevent blood clots, such as dalteparin, enoxaparin, warfarin Digoxin Fentanyl Lithium NSAIDS, medications for pain and inflammation, such as ibuprofen or naproxen Other medications that cause heart rhythm changes Rasagiline Supplements, such as St. John's wort, kava kava, valerian Tramadol Tryptophan This list may not describe all possible interactions. Give your health care provider a list of all the medicines, herbs, non-prescription drugs, or dietary supplements you use. Also tell them if you smoke, drink alcohol, or use illegal drugs. Some items may interact with your medicine. What should I watch for while using this medication? Visit your care team for regular checks on your progress. Tell your care team if your symptoms do not start to get better or if they get worse. Because it may take several weeks to see the full effects of this medication, it is important to continue your treatment as prescribed by your care team. Watch for new or worsening thoughts of suicide or depression. This  includes sudden changes in mood, behaviors,  or thoughts. These changes can happen at any time but are more common in the beginning of treatment or after a change in dose. Call your care team right away if you experience these thoughts or worsening depression. This medication may cause mood and behavior changes, such as anxiety, nervousness, irritability, hostility, restlessness, excitability, hyperactivity, or trouble sleeping. These changes can happen at any time but are more common in the beginning of treatment or after a change in dose. Call your care team right away if you notice any of these symptoms. This medication may affect your coordination, reaction time, or judgment. Do not drive or operate machinery until you know how this medication affects you. Sit up or stand slowly to reduce the risk of dizzy or fainting spells. Drinking alcohol with this medication can increase the risk of these side effects. This medication may cause dry eyes and blurred vision. If you wear contact lenses you may feel some discomfort. Lubricating drops may help. See your care team if the problem does not go away or is severe. Your mouth may get dry. Chewing sugarless gum or sucking hard candy and drinking plenty of water may help. Contact your care team if the problem does not go away or is severe. What side effects may I notice from receiving this medication? Side effects that you should report to your care team as soon as possible: Allergic reactions--skin rash, itching, hives, swelling of the face, lips, tongue, or throat Bleeding--bloody or black, tar-like stools, red or dark brown urine, vomiting blood or brown material that looks like coffee grounds, small, red or purple spots on skin, unusual bleeding or bruising Heart rhythm changes--fast or irregular heartbeat, dizziness, feeling faint or lightheaded, chest pain, trouble breathing Low blood pressure--dizziness, feeling faint or lightheaded, blurry vision Low sodium level--muscle weakness, fatigue,  dizziness, headache, confusion Prolonged or painful erection Serotonin syndrome--irritability, confusion, fast or irregular heartbeat, muscle stiffness, twitching muscles, sweating, high fever, seizures, chills, vomiting, diarrhea Sudden eye pain or change in vision such as blurry vision, seeing halos around lights, vision loss Thoughts of suicide or self-harm, worsening mood, feelings of depression Side effects that usually do not require medical attention (report to your care team if they continue or are bothersome): Change in sex drive or performance Constipation Dizziness Drowsiness Dry mouth This list may not describe all possible side effects. Call your doctor for medical advice about side effects. You may report side effects to FDA at 1-800-FDA-1088. Where should I keep my medication? Keep out of the reach of children and pets. Store at room temperature between 15 and 30 degrees C (59 to 86 degrees F). Protect from light. Keep container tightly closed. Throw away any unused medication after the expiration date. NOTE: This sheet is a summary. It may not cover all possible information. If you have questions about this medicine, talk to your doctor, pharmacist, or health care provider.  2024 Elsevier/Gold Standard (2022-08-14 00:00:00)Serotonin Syndrome Serotonin is a chemical that helps to control several functions in the body. This chemical is also called a neurotransmitter. It controls: Brain and nerve cell function. Mood and emotions. Memory. Eating. Sleeping. Sexual activity. Stress response. Having too much serotonin in your body can cause serotonin syndrome. This condition can be harmful to your brain and nerve cells. This can be a life-threatening condition. What are the causes? This condition may be caused by taking medicines or drugs that increase the level of serotonin in your body, such  as: Antidepressant medicines. Migraine medicines. Certain pain medicines. Certain  drugs, including ecstasy, LSD, cocaine, and amphetamines. Over-the-counter cough or cold medicines that contain dextromethorphan. Certain herbal supplements, including St. John's wort, ginseng, and nutmeg. This condition usually occurs when you take these medicines or drugs together, but it can also happen with a high dose of a single medicine or drug. What increases the risk? You are more likely to develop this condition if: You just started taking a medicine or drug that increases the level of serotonin in the body. You recently increased the dose of a medicine or drug that increases the level of serotonin in the body. You take more than one medicine or drug that increases the level of serotonin in the body. What are the signs or symptoms? Symptoms of this condition usually start within several hours of taking a medicine or drug. Symptoms may be mild or severe. Mild symptoms include: Sweating. Restlessness or agitation. Muscle twitching or stiffness. Rapid heart rate. Nausea, vomiting, or diarrhea. Shivering or goose bumps. Confusion. Severe symptoms include: Irregular heartbeat. Seizures. Loss of consciousness. High fever. How is this diagnosed? This condition may be diagnosed based on: Your medical history. A physical exam. Your prior use of drugs and medicines. Blood or urine tests. These may be used to rule out other causes of your symptoms. How is this treated? The treatment for this condition depends on the severity of your symptoms. For mild cases, stopping the medicine or drug that caused your condition is usually all that is needed. For moderate to severe cases, treatment in a hospital may be needed to prevent or treat life-threatening symptoms. Treatment may include: Medicines to control your symptoms. IV fluids. Actions to support your breathing. Treatments to control your body temperature. Follow these instructions at home: Medicines  Take over-the-counter and  prescription medicines only as told by your health care provider. Check with your health care provider before you start taking any new prescriptions, over-the-counter medicines, herbs, or supplements. Do not combine any medicines that can cause this condition. Lifestyle  Maintain a healthy lifestyle. Eat a healthy diet that includes plenty of vegetables, fruits, whole grains, low-fat dairy products, and lean protein. Do not eat a lot of foods that are high in fat, added sugars, or salt. Get the right amount and quality of sleep. Most adults need 7-9 hours of sleep each night. Make time to exercise, even if it is only for short periods of time. Most adults should exercise for at least 150 minutes each week. Do not drink alcohol. Do not use illegal drugs. Do not take medicines for reasons other than they are prescribed. General instructions Do not use any products that contain nicotine or tobacco. These products include cigarettes, chewing tobacco, and vaping devices, such as e-cigarettes. If you need help quitting, ask your health care provider. Contact a health care provider if: Your symptoms do not improve or they get worse. Get help right away if: You have worsening confusion, severe headache, chest pain, high fever, seizures, or loss of consciousness. You experience serious side effects of medicine, such as swelling of your face, lips, tongue, or throat. These symptoms may be an emergency. Get help right away. Call 911. Do not wait to see if the symptoms will go away. Do not drive yourself to the hospital. Also, get help right away if: You have serious thoughts about hurting yourself or others. Take one of these steps if you feel like you may hurt yourself or others, or  have thoughts about taking your own life: Go to your nearest emergency room. Call 911. Call the National Suicide Prevention Lifeline at (320) 248-8104 or 988. This is open 24 hours a day. Text the Crisis Text Line at  (604) 433-0455. Summary Serotonin is a chemical that helps to control several functions in the body. High levels of serotonin in the body can cause serotonin syndrome, which can be life-threatening. This condition may be caused by taking medicines or drugs that increase the level of serotonin in your body. Treatment depends on the severity of your symptoms. For mild cases, stopping the medicine or drug that caused your condition is usually all that is needed. Check with your health care provider before you start taking any new prescriptions, over-the-counter medicines, herbs, or supplements. This information is not intended to replace advice given to you by your health care provider. Make sure you discuss any questions you have with your health care provider. Document Revised: 11/07/2021 Document Reviewed: 11/07/2021 Elsevier Patient Education  2024 [employer].

## 2024-02-25 NOTE — Progress Notes (Signed)
 Virtual Visit via Video Note  I connected with Anita Gibbs on 02/25/24 at  8:30 AM EDT by a video enabled telemedicine application and verified that I am speaking with the correct person using two identifiers.  Location Provider Location : [facility] Patient Location : Home  Participants: Patient , Provider I discussed the limitations of evaluation and management by telemedicine and the availability of in person appointments. The patient expressed understanding and agreed to proceed.   I discussed the assessment and treatment plan with the patient. The patient was provided an opportunity to ask questions and all were answered. The patient agreed with the plan and demonstrated an understanding of the instructions.   The patient was advised to call back or seek an in-person evaluation if the symptoms worsen or if the condition fails to improve as anticipated.   BH MD OP Progress Note  02/25/2024 9:00 AM [name]  MRN:  [id number]  Chief Complaint:  Chief Complaint  Patient presents with   Anxiety   Follow-up   Depression   Medication Refill   Insomnia   Discussed the use of AI scribe software for clinical note transcription with the patient, who gave verbal consent to proceed.  History of Present Illness Anita Gibbs is a 53 year old African-American female, previously widowed, currently engaged, unemployed, lives in [place], has a history of MDD, GAD, chronic pain, hypertension, GERD, obesity, seronegative spondyloarthropathy, was evaluated by telemedicine today for a follow-up appointment.    She experiences significant pain due to flare-ups, described as 'very strong and intense,' which lead to anxiety attacks. She last took prednisone  for these flare-ups at the end of April and has been receiving injections for pain management. She is currently taking buprenorphine  for pain control.  She has been experiencing sleep disturbances, including difficulty sleeping at night  and sleeping during the day. She has been taking melatonin 10 mg at 10:30 PM, but it has not been effective in normalizing her sleep pattern. The sleep issues have been ongoing for the past week and a half. She has a history of sleep apnea and has seen a pulmonologist who recommended a home sleep study and further evaluation by a sleep specialist.  She is currently taking several medications including duloxetine , buspar , hydroxyzine  as needed.  She has noticed some improvement with the increased dosage of buSpar  with regards to her mood symptoms.  However the pain does affect her mood as well since she is currently dealing with flareup.  She reports interpersonal relationship problems with her sister, which are contributing to her mental health struggles. She is working with her therapist, [name], to address these issues.  She is planning to start a partial hospitalization program/IOP on July 1st, which was referred by her therapist about a month ago, but was delayed due to insurance issues.  She currently denies any suicidality, homicidality or perceptual disturbances.  She does report she is angry at her sister however she has been trying to stay away from her.  She reports her fianc is aware of the situation and has been supportive.     Visit Diagnosis:    ICD-10-CM   1. MDD (major depressive disorder), recurrent episode, moderate (HCC)  F33.1 traZODone (DESYREL) 50 MG tablet    2. GAD (generalized anxiety disorder)  F41.1     3. Insomnia, unspecified type  G47.00 traZODone (DESYREL) 50 MG tablet      Past Psychiatric History: I have reviewed past psychiatric history from progress  note on 01/18/2024.  Past trials of medications like Cymbalta , hydroxyzine , BuSpar -trazodone-may have helped  Past Medical History:  Past Medical History:  Diagnosis Date   Abdominal pain 06/11/2020   Abnormal drug screen 03/06/2020   Abnormal MRI, cervical spine (06/10/2018) 09/27/2018   FINDINGS:   Vertebrae: fusion hardware at C4, C5, C6, and C7.  Posterior Fossa, vertebral arteries, paraspinal tissues: A relatively empty sella present.     Disc levels:  C3-4: Negative. A mild broad-based disc osteophyte complex present. Uncovertebral spurring contributes to mild foraminal narrowing bilaterally.  C4-5: A leftward disc osteophyte complex is present. Uncovertebral and facet disease   Acute cystitis without hematuria 06/18/2020   Acute vaginitis 06/11/2020   Adenoma of left adrenal gland 08/01/2020   Adrenal nodule (HCC) 06/20/2020   Allergy     ANA positive 02/01/2021   Anxiety and depression 01/29/2021   Arthritis    Back pain with history of spinal surgery 01/29/2021   Bilateral carpal tunnel syndrome 01/29/2021   Bilateral leg edema 01/29/2021   BMI 40.0-44.9, adult (HCC) 04/30/2022   Carpal boss, right    Carpal tunnel syndrome, bilateral    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) 09/27/2018   Levels:  C4-5: There is partial effacement of ventral CSF.     IMPRESSION:  Mild residual central canal narrowing at C4-5 s/p ACDF.   Cervical facet hypertrophy (C3-T1) 09/27/2018   Levels:  C3-4: Uncovertebral spurring  C4-5: Uncovertebral and facet disease  C5-6: Residual uncovertebral disease.  C6-7: Residual uncovertebral disease.  C7-T1: Minimal left-sided uncovertebral spurring   Cervical facet joint syndrome (Bilateral) (L>R) 09/27/2018   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-5, C5-6, and C6-7) 09/27/2018   Levels:  C3-4: Mild foraminal narrowing bilaterally.  C4-5: Mild left foraminal narrowing.  C5-6: Mild left foraminal narrowing is due to residual uncovertebral disease.  C6-7: Mild left foraminal narrowing is due to residual uncovertebral disease.   Cervical myelopathy (HCC) 07/01/2017   Cervicalgia (Primary Area of Pain) (Bilateral) (L>R) 09/27/2018   Chronic gout of foot (Left) 05/31/2018   Chronic low back pain [facility] Area of Pain) (Bilateral) (R>L) w/  sciatica (Bilateral) 09/07/2018   Chronic low back pain [facility] Area of Pain) (Bilateral) (R>L) w/o sciatica 07/26/2018   Chronic lower extremity pain (Fourth Area of Pain) (Bilateral) (R>L) 09/07/2018   Chronic musculoskeletal pain 10/27/2018   Chronic neck pain (Bilateral) w/ history of cervical spinal surgery 09/27/2018   Chronic neck pain (Primary Area of Pain) (Bilateral) (L>R) 09/07/2018   Chronic sacroiliac joint dysfunction (Bilateral) 09/27/2018   Chronic sacroiliac joint pain (Right) 09/07/2018   Chronic upper extremity pain (Secondary Area of Pain) (Bilateral) (R>L) 09/27/2018   Disorder of skeletal system 09/07/2018   E. coli UTI (urinary tract infection) 06/20/2020   Elevated sed rate 09/08/2018   Elevated serum creatinine 06/20/2020   Excessive daytime sleepiness 03/06/2020   Female pelvic pain 09/05/2020   GERD (gastroesophageal reflux disease)    Headache    History of allergy  to shellfish 09/28/2018   History of fusion of cervical spine (ACDF C4-C7) 09/27/2018   History of illicit drug use 03/07/2020   Hives 09/05/2020   Hives 09/05/2020   Hyperkalemia 05/31/2018   Mild   Hyperlipidemia 04/30/2022   Hypertension    Large breasts 05/30/2020   Left hip pain 07/21/2022   Lumbar facet arthropathy 04/20/2019   Lumbar radiculitis (L5 dermatome) (Right) 04/17/2020   Multiple environmental allergies 01/17/2020   Multiple food allergies 03/07/2020   Neurogenic pain 10/27/2018  Numbness and tingling of both feet 05/31/2018   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   Other intervertebral disc degeneration, lumbar region 11/15/2019   Pharmacologic therapy 09/07/2018   Plantar fasciitis, bilateral 03/04/2021   Polyneuropathy 01/29/2021   Prediabetes 10/10/2019   Problems influencing health status 09/07/2018   Rash 08/08/2019   Seasonal allergic rhinitis due to pollen 11/29/2018   Seasonal allergies 03/06/2020   Seasonal asthma 11/29/2018   Sleep  apnea    Somatic dysfunction of sacroiliac joint (Bilateral) 09/27/2018   Spondylosis without myelopathy or radiculopathy, cervical region 03/22/2019   Spondylosis without myelopathy or radiculopathy, lumbosacral region 05/19/2019   Spondylosis, cervical, w/ myelopathy 09/27/2018   Vaginal discharge 06/11/2020    Past Surgical History:  Procedure Laterality Date   ABDOMINAL HYSTERECTOMY  10/17/2020   [facility]; she has both ovaries   ANTERIOR CERVICAL DECOMP/DISCECTOMY FUSION N/A 07/01/2017   Procedure: ANTERIOR CERVICAL DECOMPRESSION/DISCECTOMY FUSION 3 LEVELS;  Surgeon: Clois Fret, MD;  Location: [facility] ORS;  Service: Neurosurgery;  Laterality: N/A;   CARPAL TUNNEL RELEASE Bilateral    KNEE ARTHROSCOPY Right     Family Psychiatric History: I have reviewed family psychiatric history from progress note on 01/18/2024.  Family History:  Family History  Problem Relation Age of Onset   [name] Mother    Diabetes Mother    Arthritis Mother    Hypertension Mother    Hyperlipidemia Mother    Hypertension Father    Arthritis Father    Hearing loss Father    Kidney disease Father    Lymphoma Father        large b cell    Arthritis Sister    Hypertension Sister    Cancer Brother        ? lung due to exposure    COPD Brother    Early death Brother    Post-traumatic stress disorder Brother    Breast cancer Maternal Aunt    Breast cancer Paternal Aunt    Sleep apnea Daughter    COPD Son    Depression Son    Post-traumatic stress disorder Son    Allergic rhinitis Neg Hx    Angioedema Neg Hx    Eczema Neg Hx    Urticaria Neg Hx    Asthma Neg Hx     Social History: I have reviewed social history from progress note on 01/18/2024. Social History   Socioeconomic History   Marital status: Widowed    Spouse name: Not on file   Number of children: 2   Years of education: Not on file   Highest education level: 12th grade  Occupational History   Not on file  Tobacco Use    Smoking status: Never   Smokeless tobacco: Never  Vaping Use   Vaping status: Never Used  Substance and Sexual Activity   Alcohol use: Yes    Alcohol/week: 2.0 standard drinks of alcohol    Types: 2 Glasses of wine per week    Comment: occ   Drug use: No   Sexual activity: Yes    Birth control/protection: None  Other Topics Concern   Not on file  Social History Narrative   Bus driver    And school custodian    1 daughter lives in [place] 52 y.o as of 01/17/20   1 son 24 almost 16 in [place]   Fiance Darold Pounds    Social Drivers of Health   Financial Resource Strain: High Risk (01/13/2024)   Received from [facility]   Overall Financial Resource Strain (CARDIA)    Difficulty of Paying Living Expenses: Very hard  Food Insecurity: Food Insecurity Present (01/13/2024)   [facility] from [facility]   Hunger Vital Sign    Within the past 12 months, you worried that your food would run out before you got the money to buy more.: Often true    Within the past 12 months, the food you bought just didn't last and you didn't have money to get more.: Often true  Transportation Needs: No Transportation Needs (01/13/2024)   Received from [facility] - Transportation    In the past 12 months, has lack of transportation kept you from medical appointments or from getting medications?: No    Lack of Transportation (Non-Medical): No  Physical Activity: Insufficiently Active (09/28/2023)   Exercise Vital Sign    Days of Exercise per Week: 2 days    Minutes of Exercise per Session: 10 min  Stress: Stress Concern Present (09/28/2023)   [employer] of Occupational Health - Occupational Stress Questionnaire    Feeling of Stress : Very much  Social Connections: Moderately Isolated (09/28/2023)   Social Connection and Isolation Panel    Frequency of Communication with Friends and Family: Three times a week    Frequency of  Social Gatherings with Friends and Family: Once a week    Attends Religious Services: More than 4 times per year    Active Member of [employer] or Organizations: No    Attends Banker Meetings: Not on file    Marital Status: Widowed    Allergies:  Allergies  Allergen Reactions   Other Hives    White and red sauces   Pineapple     Swelling    Shellfish Allergy  Hives   Strawberry (Diagnostic) Hives   Tomato Hives    cherry    Metabolic Disorder Labs: Lab Results  Component Value Date   HGBA1C 6.1 11/02/2023   No results found for: PROLACTIN Lab Results  Component Value Date   CHOL 246 (H) 04/08/2023   TRIG 113.0 04/08/2023   HDL 64.60 04/08/2023   CHOLHDL 4 04/08/2023   VLDL 22.6 04/08/2023   LDLCALC 159 (H) 04/08/2023   LDLCALC 108 (H) 08/29/2021   Lab Results  Component Value Date   TSH 1.43 04/08/2023   TSH 2.77 01/24/2021    Therapeutic Level Labs: No results found for: LITHIUM No results found for: VALPROATE No results found for: CBMZ  Current Medications: Current Outpatient Medications  Medication Sig Dispense Refill   albuterol  (VENTOLIN  HFA) 108 (90 Base) MCG/ACT inhaler Inhale 2 puffs into the lungs every 6 (six) hours as needed for wheezing or shortness of breath. 8 g 11   amLODipine  (NORVASC ) 5 MG tablet Take 1 tablet (5 mg total) by mouth daily. 90 tablet 0   atorvastatin  (LIPITOR) 10 MG tablet MWF at night 90 tablet 3   buprenorphine  (BUTRANS ) 15 MCG/HR Place 1 patch onto the skin once a week. 4 patch 2   busPIRone  (BUSPAR ) 10 MG tablet Take 2 tablets (20 mg total) by mouth 2 (two) times daily. 120 tablet 1   butalbital -acetaminophen -caffeine  (FIORICET) 50-325-40 MG tablet Take 1 tablet by mouth every 6 (six) hours as needed for headache. 30 tablet 0   cetirizine  (ZYRTEC  ALLERGY ) 10 MG tablet Take 1 tablet (10 mg total) by mouth at bedtime as needed. 90 tablet 3   Cholecalciferol (VITAMIN D -3) 125 MCG (  5000 UT) TABS Take by mouth  daily.     Cyanocobalamin  (B-12 PO) Take by mouth.     dicyclomine  (BENTYL ) 20 MG tablet TAKE 1 TABLET BY MOUTH FOUR TIMES DAILY (BEFORE MEALS AND AT BEDTIME) 120 tablet 5   DULoxetine  (CYMBALTA ) 60 MG capsule Take 1 capsule (60 mg total) by mouth 2 (two) times daily. 60 capsule 2   EPINEPHrine  (AUVI-Q ) 0.3 mg/0.3 mL IJ SOAJ injection Use as directed for severe allergic reaction 2 Device 2   fluconazole  (DIFLUCAN ) 150 MG tablet Take 1 tablet by mouth x 1 dose. May repeat in 72 hours if needed. 2 tablet 0   fluticasone  (FLONASE ) 50 MCG/ACT nasal spray Place 2 sprays into both nostrils daily. 16 g 6   hydrocortisone  2.5 % cream Apply topically 2 (two) times daily. Prn to eyelids 30 g 0   hydrOXYzine  (VISTARIL ) 25 MG capsule Take 1 capsule (25 mg total) by mouth 3 (three) times daily. 90 capsule 2   ibuprofen (ADVIL) 800 MG tablet Take 800 mg by mouth 3 (three) times daily.     losartan  (COZAAR ) 100 MG tablet Take 1 tablet (100 mg total) by mouth daily. 90 tablet 3   Melatonin 10 MG CAPS Take 10 mg by mouth at bedtime. (Patient taking differently: Take 5 mg by mouth at bedtime.) 30 capsule 6   meloxicam  (MOBIC ) 15 MG tablet Take 1 tablet (15 mg total) by mouth every other day as needed. 45 tablet 11   methocarbamol  (ROBAXIN ) 750 MG tablet Take 1 tablet (750 mg total) by mouth every 8 (eight) hours as needed for muscle spasms. 90 tablet 11   montelukast  (SINGULAIR ) 10 MG tablet Take 1 tablet (10 mg total) by mouth at bedtime. 90 tablet 3   pantoprazole  (PROTONIX ) 40 MG tablet TAKE ONE TABLET EVERY DAY 30 MIN BEFORE DINNER 90 tablet 3   polyethylene glycol powder (GLYCOLAX /MIRALAX ) 17 GM/SCOOP powder Take 17 g by mouth 2 (two) times daily as needed. 3350 g 1   potassium chloride  SA (KLOR-CON  M) 20 MEQ tablet Take 1 tablet (20 mEq total) by mouth daily as needed. With lasix  30 tablet 11   pregabalin  (LYRICA ) 75 MG capsule Take 1 capsule (75 mg total) by mouth 3 (three) times daily. 90 capsule 11    saccharomyces boulardii (FLORASTOR) 250 MG capsule Take 1 capsule (250 mg total) by mouth daily. 90 capsule 0   traZODone (DESYREL) 50 MG tablet Take 1-2 tablets (50-100 mg total) by mouth at bedtime as needed for sleep. 60 tablet 1   triamcinolone  cream (KENALOG ) 0.1 % Apply 1 Application topically 2 (two) times daily. 30 g 0   dexamethasone  (DECADRON ) 1 MG tablet Take 1 mg by mouth once. (Patient not taking: Reported on 02/25/2024)     No current facility-administered medications for this visit.     Musculoskeletal: Strength & Muscle Tone: UTA Gait & Station: Seated Patient leans: N/A  Psychiatric Specialty Exam: Review of Systems  Psychiatric/Behavioral:  Positive for dysphoric mood and sleep disturbance. The patient is nervous/anxious.     There were no vitals taken for this visit.There is no height or weight on file to calculate BMI.  General Appearance: Casual  Eye Contact:  Good  Speech:  Clear and Coherent  Volume:  Normal  Mood:  Anxious and Depressed  Affect:  Depressed  Thought Process:  Goal Directed and Descriptions of Associations: Intact  Orientation:  Full (Time, Place, and Person)  Thought Content: Logical   Suicidal Thoughts:  No  Homicidal Thoughts:  No  Memory:  Immediate;   Fair Recent;   Fair Remote;   Fair  Judgement:  Fair  Insight:  Fair  Psychomotor Activity:  Normal  Concentration:  Concentration: Fair and Attention Span: Fair  Recall:  [employer] of Knowledge: Fair  Language: Fair  Akathisia:  No  Handed:  Left  AIMS (if indicated): not done  Assets:  Communication Skills Desire for Improvement Housing Social Support  ADL's:  Intact  Cognition: WNL  Sleep:  Poor   Screenings: GAD-7    Loss adjuster, chartered Office Visit from 01/18/2024 in [facility] Office Visit from 01/05/2024 in [facility] [employer] Visit from 11/24/2023 in [facility] [employer] Visit from 11/02/2023 in [facility] [employer] Visit from 09/30/2023 in [facility] [employer]  Total GAD-7 Score 19 19 21 21 20    PHQ2-9    Flowsheet Row Procedure visit from 02/01/2024 in [facility] Visit from 01/18/2024 in [facility] Office Visit from 01/12/2024 in [facility] Visit from 01/05/2024 in [facility] Visit from 11/24/2023 in [facility] Total Score 4 6 6 6 4   PHQ-9 Total Score -- 17 -- 25 14   Flowsheet Row Video Visit from 02/25/2024 in [facility] Office Visit from 01/18/2024 in [facility] ED from 11/18/2023 in [facility] at [facility]  C-SSRS RISK CATEGORY No Risk No Risk No Risk     Assessment and Plan: AMORY ZBIKOWSKI is a 53 year old African-American female who has a history of depression, anxiety was evaluated by telemedicine today.  Discussed assessment and plan as noted below.  Major depressive disorder-unstable Depression symptoms ongoing, recently exacerbated due to pain flareup.  Currently compliant on medications as prescribed and the higher dosage of BuSpar  seems to be beneficial.  Currently awaiting consultation to begin partial hospitalization program with [facility]. Continue Buspirone  20 mg twice daily Continue Duloxetine  60 mg twice daily Continue psychotherapy sessions with Ms. [name] Patient encouraged to participate in PHP, [place].  Generalized anxiety disorder-unstable Currently struggling with significant anxiety panic attacks exacerbated by comorbid pain. Continue  BuSpar  20 mg twice daily Continue Hydroxyzine  25 mg 3 times a day as needed Continue Duloxetine  as prescribed Encouraged to participate in PHP Continue CBT.  Insomnia unspecified-unstable Patient with history of sleep apnea currently not on CPAP.  Patient was referred to pulmonologist and completed evaluation on 02/10/2024-per review of notes per pulmonology, Dr.Kasa dated 02/10/2024, patient recommended to get a sleep study completed.  Patient also referred to Dr. Jess for sleep evaluation. Start Trazodone 50-100 mg at bedtime as needed. Patient could continue Melatonin however advised to reduce the dosage to 5 mg at bedtime as needed Patient encouraged to complete home sleep study. Patient encouraged to consult with Dr. Jess. Patient encouraged to have sufficient pain management.  Follow-up Follow-up in clinic in 4 weeks or sooner if needed.  Collaboration of Care: Collaboration of Care: Referral or follow-up with counselor/therapist AEB encouraged to continue CBT and Other encouraged to complete PHP.  Encouraged to follow up with Dr. Jess.  I have reviewed notes per [facility]  dated 02/10/2024.  Patient/Guardian was advised Release of Information must be obtained prior to any record release in order to collaborate their care with an outside provider. Patient/Guardian was advised if they have not already done so to contact the registration department to sign all necessary forms in order for us  to release information regarding their care.   Consent: Patient/Guardian gives verbal consent for treatment and assignment of benefits for services provided during this visit. Patient/Guardian expressed understanding and agreed to proceed.   This note was generated in part or whole with voice recognition software. Voice recognition is usually quite accurate but there are transcription errors that can and very often do occur. I apologize for any typographical errors that were not detected and  corrected.    Marquese Burkland, MD 02/25/2024, 9:00 AM

## 2024-02-26 ENCOUNTER — Ambulatory Visit: Payer: Self-pay | Admitting: Internal Medicine

## 2024-02-29 DIAGNOSIS — G8929 Other chronic pain: Secondary | ICD-10-CM | POA: Diagnosis not present

## 2024-02-29 DIAGNOSIS — M47816 Spondylosis without myelopathy or radiculopathy, lumbar region: Secondary | ICD-10-CM | POA: Diagnosis not present

## 2024-02-29 DIAGNOSIS — R262 Difficulty in walking, not elsewhere classified: Secondary | ICD-10-CM | POA: Diagnosis not present

## 2024-02-29 DIAGNOSIS — M5459 Other low back pain: Secondary | ICD-10-CM | POA: Diagnosis not present

## 2024-02-29 NOTE — Telephone Encounter (Signed)
 Did you want to do a P2P, or should I call the patient and let her know they denied it and theres nothing else we can do? She did not have 80% for four months according to the notes.

## 2024-03-01 ENCOUNTER — Ambulatory Visit (HOSPITAL_COMMUNITY): Admitting: Professional

## 2024-03-01 ENCOUNTER — Ambulatory Visit: Admitting: Sleep Medicine

## 2024-03-01 ENCOUNTER — Telehealth: Payer: Self-pay

## 2024-03-01 ENCOUNTER — Encounter: Payer: Self-pay | Admitting: Sleep Medicine

## 2024-03-01 VITALS — BP 128/78 | HR 66 | Ht 63.0 in | Wt 253.0 lb

## 2024-03-01 DIAGNOSIS — Z6841 Body Mass Index (BMI) 40.0 and over, adult: Secondary | ICD-10-CM | POA: Diagnosis not present

## 2024-03-01 DIAGNOSIS — G4733 Obstructive sleep apnea (adult) (pediatric): Secondary | ICD-10-CM

## 2024-03-01 DIAGNOSIS — F331 Major depressive disorder, recurrent, moderate: Secondary | ICD-10-CM | POA: Diagnosis not present

## 2024-03-01 DIAGNOSIS — F5104 Psychophysiologic insomnia: Secondary | ICD-10-CM

## 2024-03-01 DIAGNOSIS — G4700 Insomnia, unspecified: Secondary | ICD-10-CM | POA: Diagnosis not present

## 2024-03-01 DIAGNOSIS — I10 Essential (primary) hypertension: Secondary | ICD-10-CM

## 2024-03-01 DIAGNOSIS — F411 Generalized anxiety disorder: Secondary | ICD-10-CM

## 2024-03-01 MED ORDER — ZEPBOUND 2.5 MG/0.5ML ~~LOC~~ SOAJ: 2.5000 mg | SUBCUTANEOUS | 1 refills | Status: DC

## 2024-03-01 NOTE — Progress Notes (Unsigned)
 Name:Anita Gibbs MRN: [id number] DOB: Nov 06, 1970   CHIEF COMPLAINT:  EXCESSIVE DAYTIME SLEEPINESS   HISTORY OF PRESENT ILLNESS:  Anita Gibbs is a 53 y.o. w/ a h/o OSA, migraine headaches, chronic back pain, sciatica, morbid obesity, HTN, anxiety and depression who presents for c/o loud snoring and excessive daytime sleepiness which has been present for several years. Reports nocturnal awakenings due to chronic pain and has difficulty falling back to sleep. Reports a 15-20 lb weight gain over the last 4 months. Denies any significant weight changes. Admits to morning headaches and night sweats. Denies RLS symptoms, dream enactment, cataplexy, hypnagogic or hypnapompic hallucinations. Denies a family history of sleep apnea. Denies drowsy driving. Drinks tea occasionally, occasional alcohol use, denies tobacco or illicit drug use.   Bedtime 6 am Sleep onset several hours Rise time 11 am    EPWORTH SLEEP SCORE    02/10/2024    1:00 PM 03/06/2020    9:00 AM  Results of the Epworth flowsheet  Sitting and reading 3 3  Watching TV 3 3  Sitting, inactive in a public place (e.g. 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), Adrenal nodule (HCC) (06/20/2020), Allergy , ANA positive (02/01/2021), Anxiety and depression (01/29/2021), Arthritis, Back pain with history of spinal surgery (01/29/2021), Bilateral carpal tunnel  syndrome (01/29/2021), Bilateral leg edema (01/29/2021), BMI 40.0-44.9, adult (HCC) (04/30/2022), Carpal boss, right, Carpal tunnel syndrome, bilateral, 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) (09/27/2018), Cervical facet hypertrophy (C3-T1) (09/27/2018), Cervical facet joint syndrome (Bilateral) (L>R) (09/27/2018), 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-5, C5-6, and C6-7) (09/27/2018), Cervical myelopathy (HCC) (07/01/2017), Cervicalgia (Primary Area of Pain) (Bilateral) (L>R) (09/27/2018), Chronic gout of foot (Left) (05/31/2018), Chronic low back pain (Tertiary Area of Pain) (Bilateral) (R>L) w/ sciatica (Bilateral) (09/07/2018), Chronic low back pain (Tertiary Area of Pain) (Bilateral) (R>L) w/o sciatica (07/26/2018), Chronic lower extremity pain (Fourth Area of Pain) (Bilateral) (R>L) (09/07/2018), Chronic musculoskeletal pain (10/27/2018), Chronic neck pain (Bilateral) w/ history of cervical spinal surgery (09/27/2018), Chronic neck pain (Primary Area of Pain) (Bilateral) (L>R) (09/07/2018), Chronic sacroiliac joint dysfunction (Bilateral) (09/27/2018), Chronic sacroiliac joint pain (Right) (09/07/2018), Chronic upper extremity pain (Secondary Area of Pain) (Bilateral) (R>L) (09/27/2018), Disorder of skeletal system (09/07/2018), E. coli UTI (urinary tract infection) (06/20/2020), Elevated sed rate (09/08/2018), Elevated serum creatinine (06/20/2020), Excessive daytime sleepiness (03/06/2020), Female pelvic pain (09/05/2020), GERD (gastroesophageal reflux disease), Headache, History of allergy  to shellfish (09/28/2018), History of fusion of cervical spine (ACDF C4-C7) (09/27/2018), History of illicit drug use (03/07/2020), Hives (09/05/2020), Hives (09/05/2020), Hyperkalemia (05/31/2018), Hyperlipidemia (04/30/2022), Hypertension, Large breasts (05/30/2020), Left hip pain (07/21/2022), Lumbar facet arthropathy (04/20/2019), Lumbar radiculitis (L5 dermatome) (Right) (04/17/2020), Multiple  environmental allergies (01/17/2020), Multiple food allergies (03/07/2020), Neurogenic pain (10/27/2018), Numbness and tingling of both feet (05/31/2018), 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), Other intervertebral disc degeneration, 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), Pharmacologic therapy (09/07/2018),  Plantar fasciitis, bilateral (03/04/2021), Polyneuropathy (01/29/2021), Prediabetes (10/10/2019), Problems influencing health status (09/07/2018), Rash (08/08/2019), Seasonal allergic rhinitis due to pollen (11/29/2018), Seasonal allergies (03/06/2020), Seasonal asthma (11/29/2018), Sleep apnea, Somatic dysfunction of sacroiliac joint (Bilateral) (09/27/2018), Spondylosis without myelopathy or radiculopathy, cervical region (03/22/2019), Spondylosis without myelopathy or radiculopathy, lumbosacral region (05/19/2019), Spondylosis, cervical, 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  has a past surgical history that includes Knee arthroscopy (Right); Anterior cervical decomp/discectomy fusion (N/A, 07/01/2017); Abdominal hysterectomy (10/17/2020); and Carpal tunnel release (Bilateral). Prior to Admission medications   Medication Sig Start Date End Date Taking? Authorizing Provider  albuterol  (VENTOLIN  HFA) 108 (90 Base) MCG/ACT inhaler Inhale 2 puffs into the lungs every 6 (six) hours as needed for wheezing or shortness of breath. 10/23/23  Yes Gretel App, NP  amLODipine  (NORVASC ) 5 MG tablet Take 1 tablet (5 mg total) by mouth daily. 11/09/23  Yes Gretel App, NP  atorvastatin  (LIPITOR) 10 MG tablet MWF at night 04/08/23  Yes Hope Merle, MD  buprenorphine  (BUTRANS ) 15 MCG/HR Place 1 patch onto the skin once a week. 01/12/24  Yes Patel, Seema K, NP  busPIRone  (BUSPAR ) 10 MG tablet Take 2 tablets (20 mg total) by mouth 2 (two) times daily. 01/18/24  Yes Eappen, Saramma, MD  butalbital -acetaminophen -caffeine  (FIORICET) 50-325-40 MG tablet Take 1 tablet by mouth every 6 (six)  hours as needed for headache. 01/05/24  Yes Gretel App, NP  cetirizine  (ZYRTEC  ALLERGY ) 10 MG tablet Take 1 tablet (10 mg total) by mouth at bedtime as needed. 04/08/23  Yes Hope Merle, MD  Cholecalciferol (VITAMIN D -3) 125 MCG (5000 UT) TABS Take by mouth daily.   Yes [provider]  Cyanocobalamin  (B-12 PO) Take by mouth.   Yes [provider]  dexamethasone  (DECADRON ) 1 MG tablet Take 1 mg by mouth once. 02/23/24  Yes [provider]  dicyclomine  (BENTYL ) 20 MG tablet TAKE 1 TABLET BY MOUTH FOUR TIMES DAILY (BEFORE MEALS AND AT BEDTIME) 01/28/24  Yes Gretel App, NP  DULoxetine  (CYMBALTA ) 60 MG capsule Take 1 capsule (60 mg total) by mouth 2 (two) times daily. 10/23/23  Yes Gretel App, NP  EPINEPHrine  (AUVI-Q ) 0.3 mg/0.3 mL IJ SOAJ injection Use as directed for severe allergic reaction 08/19/18  Yes Kozlow, Eric J, MD  fluticasone  (FLONASE ) 50 MCG/ACT nasal spray Place 2 sprays into both nostrils daily. 04/08/23  Yes Hope Merle, MD  hydrocortisone  2.5 % cream Apply topically 2 (two) times daily. Prn to eyelids 04/08/23  Yes Hope Merle, MD  hydrOXYzine  (VISTARIL ) 25 MG capsule Take 1 capsule (25 mg total) by mouth 3 (three) times daily. 11/09/23  Yes Gretel App, NP  ibuprofen (ADVIL) 800 MG tablet Take 800 mg by mouth 3 (three) times daily. 10/28/23  Yes [provider]  losartan  (COZAAR ) 100 MG tablet Take 1 tablet (100 mg total) by mouth daily. 04/08/23  Yes Hope Merle, MD  Melatonin 10 MG CAPS Take 10 mg by mouth at bedtime. 02/10/24  Yes Kasa, Kurian, MD  methocarbamol  (ROBAXIN ) 750 MG tablet Take 1 tablet (750 mg total) by mouth every 8 (eight) hours as needed for muscle spasms. 01/12/24 01/06/25 Yes Patel, Seema K, NP  montelukast  (SINGULAIR ) 10 MG tablet Take 1 tablet (10 mg total) by mouth at bedtime. 04/08/23  Yes Hope Merle, MD  pantoprazole  (PROTONIX ) 40 MG tablet TAKE ONE TABLET EVERY DAY 30 MIN BEFORE DINNER 04/08/23  Yes Walsh, Tanya, MD  polyethylene  glycol powder (GLYCOLAX /MIRALAX ) 17 GM/SCOOP powder Take 17 g by mouth  2 (two) times daily as needed. 08/05/22  Yes Hope Merle, MD  potassium chloride  SA (KLOR-CON  M) 20 MEQ tablet Take 1 tablet (20 mEq total) by mouth daily as needed. With lasix  04/08/23  Yes Hope Merle, MD  pregabalin  (LYRICA ) 100 MG capsule Take 1 capsule (100 mg total) by mouth 3 (three) times daily. 02/25/24  Yes Marcelino Nurse, MD  saccharomyces boulardii (FLORASTOR) 250 MG capsule Take 1 capsule (250 mg total) by mouth daily. 04/08/23  Yes Hope Merle, MD  traZODone  (DESYREL ) 50 MG tablet Take 1-2 tablets (50-100 mg total) by mouth at bedtime as needed for sleep. 02/25/24  Yes Eappen, Saramma, MD  triamcinolone  cream (KENALOG ) 0.1 % Apply 1 Application topically 2 (two) times daily. 11/24/23  Yes Gretel App, NP  fluconazole  (DIFLUCAN ) 150 MG tablet Take 1 tablet by mouth x 1 dose. May repeat in 72 hours if needed. Patient not taking: Reported on 03/01/2024 08/31/23   Gretel App, NP  meloxicam  (MOBIC ) 15 MG tablet Take 1 tablet (15 mg total) by mouth every other day as needed. Patient not taking: Reported on 03/01/2024 12/24/21   Marcelino Nurse, MD   Allergies  Allergen Reactions   Other Hives    White and red sauces   Pineapple     Swelling    Shellfish Allergy  Hives   Strawberry (Diagnostic) Hives   Tomato Hives    cherry    FAMILY HISTORY:  family history includes [name] in her mother; Arthritis in her father, mother, and sister; Breast cancer in her maternal aunt and paternal aunt; COPD in her brother and son; Cancer in her brother; Depression in her son; Diabetes in her mother; Early death in her brother; Hearing loss in her father; Hyperlipidemia in her mother; Hypertension in her father, mother, and sister; Kidney disease in her father; Lymphoma in her father; Post-traumatic stress disorder in her brother and son; Sleep apnea in her daughter. SOCIAL HISTORY:  reports that she has never smoked. She has never used  smokeless tobacco. She reports current alcohol use of about 2.0 standard drinks of alcohol per week. She reports that she does not use drugs.   Review of Systems:  Gen:  Denies  fever, sweats, chills weight loss  HEENT: Denies blurred vision, double vision, ear pain, eye pain, hearing loss, nose bleeds, sore throat Cardiac:  No dizziness, chest pain or heaviness, chest tightness,edema, No JVD Resp:   No cough, -sputum production, -shortness of breath,-wheezing, -hemoptysis,  Gi: Denies swallowing difficulty, stomach pain, nausea or vomiting, diarrhea, constipation, bowel incontinence Gu:  Denies bladder incontinence, burning urine Ext:   Denies Joint pain, stiffness or swelling Skin: Denies  skin rash, easy bruising or bleeding or hives Endoc:  Denies polyuria, polydipsia , polyphagia or weight change Psych:   Denies depression, insomnia or hallucinations  Other:  All other systems negative  VITAL SIGNS: BP 128/78   Pulse 66   Ht 5' 3 (1.6 m)   Wt 253 lb (114.8 kg)   LMP  (LMP Unknown)   SpO2 98%   BMI 44.82 kg/m    Physical Examination:   General Appearance: No distress  EYES PERRLA, EOM intact.   NECK Supple, No JVD Pulmonary: normal breath sounds, No wheezing.  CardiovascularNormal S1,S2.  No m/r/g.   Abdomen: Benign, Soft, non-tender. Skin:   warm, no rashes, no ecchymosis  Extremities: normal, no cyanosis, clubbing. Neuro:without focal findings,  speech normal  PSYCHIATRIC: Mood, affect within normal limits.   ASSESSMENT AND PLAN  OSA I suspect that OSA is likely present due to clinical presentation. Discussed the consequences of untreated sleep apnea. Advised not to drive drowsy for safety of patient and others. Will complete further evaluation with a home sleep study and follow up to review results.    HTN Stable, on current management. Following with PCP.   Morbid obesity Counseled patient on diet and lifestyle   MEDICATION ADJUSTMENTS/LABS AND TESTS  ORDERED: Recommend Sleep Study   Patient  satisfied with Plan of action and management. All questions answered  Follow up to review HST results and treatment plan.   I spent a total of  *** minutes reviewing chart data, face-to-face evaluation with the patient, counseling and coordination of care as detailed above.    Elfrieda Espino, M.D.  Sleep Medicine [facility]

## 2024-03-01 NOTE — Patient Instructions (Signed)
 SABRA

## 2024-03-01 NOTE — Telephone Encounter (Unsigned)
 Copied from CRM [id number]. Topic: Clinical - Request for Lab/Test Order >> Mar 01, 2024  4:00 PM Dustin F wrote: Reason for CRM: Pt is requesting a sleep study that will be covered by her insurance. Pt stated she attempted to do the home sleep study through [facility], but they would not take her insurance. Pt is open to another supplier for a sleep study at home or at a facility if it needs to be an overnight sleep study. Please call the pt back at [phone] ok to leave a vm.

## 2024-03-02 ENCOUNTER — Telehealth: Payer: Self-pay

## 2024-03-02 NOTE — Telephone Encounter (Signed)
 Patient has [employer] and is not in network with [employer]. No order has been placed yet for HST but I will try to get HST approved for the patient to pick up our HST machine

## 2024-03-02 NOTE — Telephone Encounter (Signed)
 Copied from CRM [id number]. Topic: Referral - Prior Authorization Question >> Mar 02, 2024  3:23 PM Rilla B wrote: Reason for CRM: Patient saw Dr Jess on yesterday 7/01 and she states she called in a prescription for Zepbound and the pharmacy needs pre authorization.  Patient is requesting someone call the pharmacy and authorize med (?)   ----------------------------------------------------------------------- From previous Reason for Contact - Other: Reason for CRM:
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
## 2024-03-03 ENCOUNTER — Ambulatory Visit (HOSPITAL_COMMUNITY): Admitting: Licensed Clinical Social Worker

## 2024-03-03 DIAGNOSIS — F411 Generalized anxiety disorder: Secondary | ICD-10-CM

## 2024-03-03 DIAGNOSIS — F332 Major depressive disorder, recurrent severe without psychotic features: Secondary | ICD-10-CM | POA: Diagnosis not present

## 2024-03-03 LAB — CATECHOLAMINES, FRACTIONATED, PLASMA
Dopamine: 22 pg/mL
Epinephrine: 16 pg/mL
Norepinephrine: 461 pg/mL
Total Catecholamines: 499 pg/mL

## 2024-03-03 LAB — ALDOSTERONE + RENIN ACTIVITY W/ RATIO
ALDO / PRA Ratio: 2 ratio (ref 0.9–28.9)
Aldosterone: 2 ng/dL
Renin Activity: 1.02 ng/mL/h (ref 0.25–5.82)

## 2024-03-03 LAB — POTASSIUM: Potassium: 4.3 mmol/L (ref 3.5–5.3)

## 2024-03-03 NOTE — Telephone Encounter (Signed)
 Evalene can you place an order for HST seen on 03/01/24 and no order placed

## 2024-03-03 NOTE — Addendum Note (Signed)
 Addended by: VICCI EVALENE DEL on: 03/03/2024 04:40 PM   Modules accepted: Orders

## 2024-03-07 ENCOUNTER — Ambulatory Visit (INDEPENDENT_AMBULATORY_CARE_PROVIDER_SITE_OTHER): Admitting: Licensed Clinical Social Worker

## 2024-03-07 ENCOUNTER — Ambulatory Visit: Admitting: Student in an Organized Health Care Education/Training Program

## 2024-03-07 ENCOUNTER — Telehealth: Payer: Self-pay | Admitting: Pharmacy Technician

## 2024-03-07 DIAGNOSIS — F411 Generalized anxiety disorder: Secondary | ICD-10-CM

## 2024-03-07 DIAGNOSIS — G4700 Insomnia, unspecified: Secondary | ICD-10-CM

## 2024-03-07 DIAGNOSIS — F331 Major depressive disorder, recurrent, moderate: Secondary | ICD-10-CM

## 2024-03-07 DIAGNOSIS — F332 Major depressive disorder, recurrent severe without psychotic features: Secondary | ICD-10-CM | POA: Diagnosis not present

## 2024-03-07 DIAGNOSIS — G479 Sleep disorder, unspecified: Secondary | ICD-10-CM | POA: Diagnosis not present

## 2024-03-07 NOTE — Progress Notes (Signed)
 Virtual Visit via Video Note  I connected with Anita Gibbs on 03/07/24 at  9:00 AM EDT by a video enabled telemedicine application and verified that I am speaking with the correct person using two identifiers.  Location: Patient: Home Provider: Office   I discussed the limitations of evaluation and management by telemedicine and the availability of in person appointments. The patient expressed understanding and agreed to proceed.  I discussed the assessment and treatment plan with the patient. The patient was provided an opportunity to ask questions and all were answered. The patient agreed with the plan and demonstrated an understanding of the instructions.   The patient was advised to call back or seek an in-person evaluation if the symptoms worsen or if the condition fails to improve as anticipated.  I provided 15 minutes of non-face-to-face time during this encounter.   Staci LOISE Kerns, NP   [facility] MD/PA/NP OP Progress Note  03/07/2024 11:27 AM Anita Gibbs  MRN:  [id number]  Chief Complaint: Weekly progress while attending partial hospitalization program through [facility]  HPI:  Anita Gibbs 53 year old African-American female seen and evaluated for weekly progress assessment while attending partial hospitalization programming.  She reports this is her second day of session.  She carries a diagnosis related to major depressive disorder, generalized anxiety disorder and often struggles with pain.  States she is followed by pain management with a follow-up appointment today.  She reports sleep disturbance however, attributes it to current diagnosis related to joint pain degenerative disc disease,  seronegatively spondyloarthropathy and sciatica.  Documented sleep study scheduled through primary care.   Anita Gibbs reports she has been taking and tolerating her medications well.  States  sometimes I feel gloomy when taking medications.  She denies illicit drug use or substance  abuse history.  States she has been attempting to utilize coping skills that she learned on the first day.  States she has noticed that her anxiety has subsided and she no longer is experiencing blackouts  Anita Gibbs is rating her depression and anxiety 4 out of 10 with 10 being the worst.  Reports a good appetite.  States she is not resting well at night.  She reports sleep disturbance due to uncontrolled pain.  She denies suicidal or homicidal ideations.  Denies auditory or visual hallucinations.   Visit Diagnosis:    ICD-10-CM   1. MDD (major depressive disorder), recurrent episode, moderate (HCC)  F33.1     2. GAD (generalized anxiety disorder)  F41.1     3. Insomnia, unspecified type  G47.00       Past Psychiatric History:   Past Medical History:  Past Medical History:  Diagnosis Date   Abdominal pain 06/11/2020   Abnormal drug screen 03/06/2020   Abnormal MRI, cervical spine (06/10/2018) 09/27/2018   FINDINGS:  Vertebrae: fusion hardware at C4, C5, C6, and C7.  Posterior Fossa, vertebral arteries, paraspinal tissues: A relatively empty sella present.     Disc levels:  C3-4: Negative. A mild broad-based disc osteophyte complex present. Uncovertebral spurring contributes to mild foraminal narrowing bilaterally.  C4-5: A leftward disc osteophyte complex is present. Uncovertebral and facet disease   Acute cystitis without hematuria 06/18/2020   Acute vaginitis 06/11/2020   Adenoma of left adrenal gland 08/01/2020   Adrenal nodule (HCC) 06/20/2020   Allergy     ANA positive 02/01/2021   Anxiety and depression 01/29/2021   Arthritis    Back pain with history of spinal surgery 01/29/2021   Bilateral carpal tunnel  syndrome 01/29/2021   Bilateral leg edema 01/29/2021   BMI 40.0-44.9, adult (HCC) 04/30/2022   Carpal boss, right    Carpal tunnel syndrome, bilateral    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) 09/27/2018   Levels:  C4-5: There is partial effacement of ventral CSF.      IMPRESSION:  Mild residual central canal narrowing at C4-5 s/p ACDF.   Cervical facet hypertrophy (C3-T1) 09/27/2018   Levels:  C3-4: Uncovertebral spurring  C4-5: Uncovertebral and facet disease  C5-6: Residual uncovertebral disease.  C6-7: Residual uncovertebral disease.  C7-T1: Minimal left-sided uncovertebral spurring   Cervical facet joint syndrome (Bilateral) (L>R) 09/27/2018   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-5, C5-6, and C6-7) 09/27/2018   Levels:  C3-4: Mild foraminal narrowing bilaterally.  C4-5: Mild left foraminal narrowing.  C5-6: Mild left foraminal narrowing is due to residual uncovertebral disease.  C6-7: Mild left foraminal narrowing is due to residual uncovertebral disease.   Cervical myelopathy (HCC) 07/01/2017   Cervicalgia (Primary Area of Pain) (Bilateral) (L>R) 09/27/2018   Chronic gout of foot (Left) 05/31/2018   Chronic low back pain [facility] Area of Pain) (Bilateral) (R>L) w/ sciatica (Bilateral) 09/07/2018   Chronic low back pain [facility] Area of Pain) (Bilateral) (R>L) w/o sciatica 07/26/2018   Chronic lower extremity pain (Fourth Area of Pain) (Bilateral) (R>L) 09/07/2018   Chronic musculoskeletal pain 10/27/2018   Chronic neck pain (Bilateral) w/ history of cervical spinal surgery 09/27/2018   Chronic neck pain (Primary Area of Pain) (Bilateral) (L>R) 09/07/2018   Chronic sacroiliac joint dysfunction (Bilateral) 09/27/2018   Chronic sacroiliac joint pain (Right) 09/07/2018   Chronic upper extremity pain (Secondary Area of Pain) (Bilateral) (R>L) 09/27/2018   Disorder of skeletal system 09/07/2018   E. coli UTI (urinary tract infection) 06/20/2020   Elevated sed rate 09/08/2018   Elevated serum creatinine 06/20/2020   Excessive daytime sleepiness 03/06/2020   Female pelvic pain 09/05/2020   GERD (gastroesophageal reflux disease)    Headache    History of allergy  to shellfish 09/28/2018   History of fusion of cervical spine (ACDF C4-C7)  09/27/2018   History of illicit drug use 03/07/2020   Hives 09/05/2020   Hives 09/05/2020   Hyperkalemia 05/31/2018   Mild   Hyperlipidemia 04/30/2022   Hypertension    Large breasts 05/30/2020   Left hip pain 07/21/2022   Lumbar facet arthropathy 04/20/2019   Lumbar radiculitis (L5 dermatome) (Right) 04/17/2020   Multiple environmental allergies 01/17/2020   Multiple food allergies 03/07/2020   Neurogenic pain 10/27/2018   Numbness and tingling of both feet 05/31/2018   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   Other intervertebral disc degeneration, lumbar region 11/15/2019   Pharmacologic therapy 09/07/2018   Plantar fasciitis, bilateral 03/04/2021   Polyneuropathy 01/29/2021   Prediabetes 10/10/2019   Problems influencing health status 09/07/2018   Rash 08/08/2019   Seasonal allergic rhinitis due to pollen 11/29/2018   Seasonal allergies 03/06/2020   Seasonal asthma 11/29/2018   Sleep apnea    Somatic dysfunction of sacroiliac joint (Bilateral) 09/27/2018   Spondylosis without myelopathy or radiculopathy, cervical region 03/22/2019   Spondylosis without myelopathy or radiculopathy, lumbosacral region 05/19/2019   Spondylosis, cervical, w/ myelopathy 09/27/2018   Vaginal discharge 06/11/2020    Past Surgical History:  Procedure Laterality Date   ABDOMINAL HYSTERECTOMY  10/17/2020   [facility]; she has both ovaries   ANTERIOR CERVICAL DECOMP/DISCECTOMY FUSION N/A 07/01/2017   Procedure: ANTERIOR CERVICAL DECOMPRESSION/DISCECTOMY FUSION 3 LEVELS;  Surgeon: Clois Fret, MD;  Location: [facility] ORS;  Service: Neurosurgery;  Laterality: N/A;   CARPAL TUNNEL RELEASE Bilateral    KNEE ARTHROSCOPY Right     Family Psychiatric History:   Family History:  Family History  Problem Relation Age of Onset   [name] Mother    Diabetes Mother    Arthritis Mother    Hypertension Mother    Hyperlipidemia Mother    Hypertension Father    Arthritis Father     Hearing loss Father    Kidney disease Father    Lymphoma Father        large b cell    Arthritis Sister    Hypertension Sister    Cancer Brother        ? lung due to exposure    COPD Brother    Early death Brother    Post-traumatic stress disorder Brother    Breast cancer Maternal Aunt    Breast cancer Paternal Aunt    Sleep apnea Daughter    COPD Son    Depression Son    Post-traumatic stress disorder Son    Allergic rhinitis Neg Hx    Angioedema Neg Hx    Eczema Neg Hx    Urticaria Neg Hx    Asthma Neg Hx     Social History:  Social History   Socioeconomic History   Marital status: Widowed    Spouse name: Not on file   Number of children: 2   Years of education: Not on file   Highest education level: 12th grade  Occupational History   Not on file  Tobacco Use   Smoking status: Never   Smokeless tobacco: Never  Vaping Use   Vaping status: Never Used  Substance and Sexual Activity   Alcohol use: Yes    Alcohol/week: 2.0 standard drinks of alcohol    Types: 2 Glasses of wine per week    Comment: occ   Drug use: No   Sexual activity: Yes    Birth control/protection: None  Other Topics Concern   Not on file  Social History Narrative   Bus driver    And school custodian    1 daughter lives in [place] 70 y.o as of 01/17/20   1 son 24 almost 23 in [place]   Fiance Darold Pounds    Social Drivers of Health   Financial Resource Strain: High Risk (01/13/2024)   Received from [facility]   Overall Financial Resource Strain (CARDIA)    Difficulty of Paying Living Expenses: Very hard  Food Insecurity: Food Insecurity Present (01/13/2024)   [facility] from [facility]   Hunger Vital Sign    Within the past 12 months, you worried that your food would run out before you got the money to buy more.: Often true    Within the past 12 months, the food you bought just didn't last and you didn't have money to get more.: Often true   Transportation Needs: No Transportation Needs (01/13/2024)   Received from [facility] - Transportation    In the past 12 months, has lack of transportation kept you from medical appointments or from getting medications?: No    Lack of Transportation (Non-Medical): No  Physical Activity: Insufficiently Active (09/28/2023)   Exercise Vital Sign    Days of Exercise per Week: 2 days    Minutes of Exercise per Session: 10 min  Stress: Stress Concern Present (09/28/2023)   [employer] of Occupational  Health - Occupational Stress Questionnaire    Feeling of Stress : Very much  Social Connections: Moderately Isolated (09/28/2023)   Social Connection and Isolation Panel    Frequency of Communication with Friends and Family: Three times a week    Frequency of Social Gatherings with Friends and Family: Once a week    Attends Religious Services: More than 4 times per year    Active Member of [employer] or Organizations: No    Attends Banker Meetings: Not on file    Marital Status: Widowed    Allergies:  Allergies  Allergen Reactions   Other Hives    White and red sauces   Pineapple     Swelling    Shellfish Allergy  Hives   Strawberry (Diagnostic) Hives   Tomato Hives    cherry    Metabolic Disorder Labs: Lab Results  Component Value Date   HGBA1C 6.1 11/02/2023   No results found for: PROLACTIN Lab Results  Component Value Date   CHOL 246 (H) 04/08/2023   TRIG 113.0 04/08/2023   HDL 64.60 04/08/2023   CHOLHDL 4 04/08/2023   VLDL 22.6 04/08/2023   LDLCALC 159 (H) 04/08/2023   LDLCALC 108 (H) 08/29/2021   Lab Results  Component Value Date   TSH 1.43 04/08/2023   TSH 2.77 01/24/2021    Therapeutic Level Labs: No results found for: LITHIUM No results found for: VALPROATE No results found for: CBMZ  Current Medications: Current Outpatient Medications  Medication Sig Dispense Refill   albuterol  (VENTOLIN  HFA) 108 (90  Base) MCG/ACT inhaler Inhale 2 puffs into the lungs every 6 (six) hours as needed for wheezing or shortness of breath. 8 g 11   amLODipine  (NORVASC ) 5 MG tablet Take 1 tablet (5 mg total) by mouth daily. 90 tablet 0   atorvastatin  (LIPITOR) 10 MG tablet MWF at night 90 tablet 3   buprenorphine  (BUTRANS ) 15 MCG/HR Place 1 patch onto the skin once a week. 4 patch 2   busPIRone  (BUSPAR ) 10 MG tablet Take 2 tablets (20 mg total) by mouth 2 (two) times daily. 120 tablet 1   butalbital -acetaminophen -caffeine  (FIORICET) 50-325-40 MG tablet Take 1 tablet by mouth every 6 (six) hours as needed for headache. 30 tablet 0   cetirizine  (ZYRTEC  ALLERGY ) 10 MG tablet Take 1 tablet (10 mg total) by mouth at bedtime as needed. 90 tablet 3   Cholecalciferol (VITAMIN D -3) 125 MCG (5000 UT) TABS Take by mouth daily.     Cyanocobalamin  (B-12 PO) Take by mouth.     dexamethasone  (DECADRON ) 1 MG tablet Take 1 mg by mouth once.     dicyclomine  (BENTYL ) 20 MG tablet TAKE 1 TABLET BY MOUTH FOUR TIMES DAILY (BEFORE MEALS AND AT BEDTIME) 120 tablet 5   DULoxetine  (CYMBALTA ) 60 MG capsule Take 1 capsule (60 mg total) by mouth 2 (two) times daily. 60 capsule 2   EPINEPHrine  (AUVI-Q ) 0.3 mg/0.3 mL IJ SOAJ injection Use as directed for severe allergic reaction 2 Device 2   fluconazole  (DIFLUCAN ) 150 MG tablet Take 1 tablet by mouth x 1 dose. May repeat in 72 hours if needed. (Patient not taking: Reported on 03/01/2024) 2 tablet 0   fluticasone  (FLONASE ) 50 MCG/ACT nasal spray Place 2 sprays into both nostrils daily. 16 g 6   hydrocortisone  2.5 % cream Apply topically 2 (two) times daily. Prn to eyelids 30 g 0   hydrOXYzine  (VISTARIL ) 25 MG capsule Take 1 capsule (25 mg total) by mouth 3 (three) times  daily. 90 capsule 2   ibuprofen (ADVIL) 800 MG tablet Take 800 mg by mouth 3 (three) times daily.     losartan  (COZAAR ) 100 MG tablet Take 1 tablet (100 mg total) by mouth daily. 90 tablet 3   Melatonin 10 MG CAPS Take 10 mg by mouth  at bedtime. 30 capsule 6   meloxicam  (MOBIC ) 15 MG tablet Take 1 tablet (15 mg total) by mouth every other day as needed. (Patient not taking: Reported on 03/01/2024) 45 tablet 11   methocarbamol  (ROBAXIN ) 750 MG tablet Take 1 tablet (750 mg total) by mouth every 8 (eight) hours as needed for muscle spasms. 90 tablet 11   montelukast  (SINGULAIR ) 10 MG tablet Take 1 tablet (10 mg total) by mouth at bedtime. 90 tablet 3   pantoprazole  (PROTONIX ) 40 MG tablet TAKE ONE TABLET EVERY DAY 30 MIN BEFORE DINNER 90 tablet 3   polyethylene glycol powder (GLYCOLAX /MIRALAX ) 17 GM/SCOOP powder Take 17 g by mouth 2 (two) times daily as needed. 3350 g 1   potassium chloride  SA (KLOR-CON  M) 20 MEQ tablet Take 1 tablet (20 mEq total) by mouth daily as needed. With lasix  30 tablet 11   pregabalin  (LYRICA ) 100 MG capsule Take 1 capsule (100 mg total) by mouth 3 (three) times daily. 90 capsule 0   saccharomyces boulardii (FLORASTOR) 250 MG capsule Take 1 capsule (250 mg total) by mouth daily. 90 capsule 0   tirzepatide  (ZEPBOUND ) 2.5 MG/0.5ML Pen Inject 2.5 mg into the skin once a week. 2 mL 1   traZODone  (DESYREL ) 50 MG tablet Take 1-2 tablets (50-100 mg total) by mouth at bedtime as needed for sleep. 60 tablet 1   triamcinolone  cream (KENALOG ) 0.1 % Apply 1 Application topically 2 (two) times daily. 30 g 0   No current facility-administered medications for this visit.     Musculoskeletal: Virtual assessment   Psychiatric Specialty Exam: Review of Systems  There were no vitals taken for this visit.There is no height or weight on file to calculate BMI.  General Appearance: Casual  Eye Contact:  Good  Speech:  Clear and Coherent  Volume:  Normal  Mood:  Anxious and Depressed  Affect:  Congruent  Thought Process:  Coherent  Orientation:  Full (Time, Place, and Person)  Thought Content: Logical   Suicidal Thoughts:  No  Homicidal Thoughts:  No  Memory:  Immediate;   Good Recent;   Good  Judgement:  Good   Insight:  Good  Psychomotor Activity:  Normal  Concentration:  Concentration: Good  Recall:  Good  Fund of Knowledge: Good  Language: Good  Akathisia:  No  Handed:  Right  AIMS (if indicated): not done  Assets:  Communication Skills Desire for Improvement  ADL's:  Intact  Cognition: WNL  Sleep:  Poor started melatonin and trazodone    Screenings: GAD-7    Flowsheet Row Office Visit from 01/18/2024 in [facility] Office Visit from 01/05/2024 in [facility] [employer] Visit from 11/24/2023 in [facility] [employer] Visit from 11/02/2023 in [facility] [employer] Visit from 09/30/2023 in [facility] at [employer]  Total GAD-7 Score 19 19 21 21 20    [employer]    Flowsheet Row Counselor from 03/01/2024 in [place] [facility] visit from 02/01/2024 in [facility] Visit from 01/18/2024 in [facility] Office Visit from 01/12/2024 in [facility] Visit from 01/05/2024 in [facility] Total Score 4 4 6 6 6   PHQ-9 Total Score 17 -- 17 -- 25   Flowsheet Row Counselor from [facility]/01/2024 in [facility] Video Visit from 02/25/2024 in [facility] Office Visit from 01/18/2024 in [facility]  C-SSRS RISK CATEGORY No Risk No Risk No Risk     Assessment and Plan:  Continue partial hospitalization programming Continue medications as directed Was recently initiated on melatonin and trazodone  to help with sleep disturbance  Collaboration of Care: Collaboration of Care: Medication  Management AEB continue current medication regimen i.e. BuSpar , buspirone  and hydroxyzine   Patient/Guardian was advised Release of Information must be obtained prior to any record release in order to collaborate their care with an outside provider. Patient/Guardian was advised if they have not already done so to contact the registration department to sign all necessary forms in order for us  to release information regarding their care.   Consent: Patient/Guardian gives verbal consent for treatment and assignment of benefits for services provided during this visit. Patient/Guardian expressed understanding and agreed to proceed.    Staci LOISE Kerns, NP 03/07/2024, 11:27 AM

## 2024-03-07 NOTE — Telephone Encounter (Signed)
 Submitted a Prior Authorization request to [facility] [place] Medicaid for Zepbound  2.5mg  via CoverMyMeds. Will update once we receive a response.  Key: [id number]

## 2024-03-08 ENCOUNTER — Ambulatory Visit: Attending: Student in an Organized Health Care Education/Training Program | Admitting: Nurse Practitioner

## 2024-03-08 ENCOUNTER — Encounter: Payer: Self-pay | Admitting: Nurse Practitioner

## 2024-03-08 ENCOUNTER — Encounter (HOSPITAL_COMMUNITY): Payer: Self-pay

## 2024-03-08 ENCOUNTER — Ambulatory Visit (HOSPITAL_COMMUNITY)

## 2024-03-08 DIAGNOSIS — M5442 Lumbago with sciatica, left side: Secondary | ICD-10-CM

## 2024-03-08 DIAGNOSIS — F332 Major depressive disorder, recurrent severe without psychotic features: Secondary | ICD-10-CM | POA: Diagnosis not present

## 2024-03-08 DIAGNOSIS — M47816 Spondylosis without myelopathy or radiculopathy, lumbar region: Secondary | ICD-10-CM | POA: Diagnosis not present

## 2024-03-08 DIAGNOSIS — G894 Chronic pain syndrome: Secondary | ICD-10-CM | POA: Diagnosis not present

## 2024-03-08 DIAGNOSIS — G8929 Other chronic pain: Secondary | ICD-10-CM

## 2024-03-08 DIAGNOSIS — M5441 Lumbago with sciatica, right side: Secondary | ICD-10-CM | POA: Diagnosis not present

## 2024-03-08 DIAGNOSIS — F411 Generalized anxiety disorder: Secondary | ICD-10-CM | POA: Diagnosis not present

## 2024-03-08 NOTE — Progress Notes (Signed)
 Spoke with patient via Teams video call, used 2 identifiers to correctly identify patient. States that groups are going well, this is her first time in [facility] as referred by her therapist. She had her Father die May 2024, has family issues, dealing with chronic pain and medical issues. Today she is having a lot of pain and is waiting to hear back from a Doctor about what to do. She attends a pain clinic. No inpatient history. On scale 1-10 as 10 being worst she rates depression at 8 and anxiety at 10. Very anxious about her pain. Denies SI/HI or AVH. PHQ9=17. No other issues or complaints. No side effects from medication.

## 2024-03-08 NOTE — Progress Notes (Signed)
 PROVIDER NOTE: Interpretation of information contained herein should be left to medically-trained personnel. Specific patient instructions are provided elsewhere under Patient Instructions section of medical record. This document was created in part using AI and STT-dictation technology, any transcriptional errors that may result from this process are unintentional.  Patient: Anita Gibbs  Service: E/M   PCP: Gretel App, NP  DOB: Jun 15, 1971  DOS: 03/08/2024  Provider: Emmy MARLA Blanch, NP  MRN: [id number]  Delivery: Virtual Visit  Specialty: Interventional Pain Management  Type: Established Patient  Setting: Ambulatory outpatient facility  Specialty designation: 09  Referring Prov.: Gretel App, NP  Location: Remote location       Virtual Encounter - Pain Management PROVIDER NOTE: Information contained herein reflects review and annotations entered in association with encounter. Interpretation of such information and data should be left to medically-trained personnel. Information provided to patient can be located elsewhere in the medical record under Patient Instructions. Document created using STT-dictation technology, any transcriptional errors that may result from process are unintentional.    Contact & Pharmacy Preferred: [phone] Home: [phone] (home) Mobile: [phone] (mobile) E-mail: [email or web address]  [facility] - [place], [place] - [address] [place] [address] Phone: [phone] Fax: [phone]   Pre-screening  [name] offered in-person vs virtual encounter. She indicated preferring virtual for this encounter.   Reason COVID-19*  Social distancing based on CDC and AMA recommendations.   I contacted Anita Gibbs on 03/08/2024 via telephone.      I clearly identified myself as Emmy MARLA Blanch, NP. I verified that I was speaking with the correct person using two identifiers (Name: Anita Gibbs, and date of birth:  04/09/1971).  Consent I sought verbal advanced consent from Anita Gibbs for virtual visit interactions. I informed Ms. Vittorio of possible security and privacy concerns, risks, and limitations associated with providing not-in-person medical evaluation and management services. I also informed Ms. Giddens of the availability of in-person appointments. Finally, I informed her that there would be a charge for the virtual visit and that she could be  personally, fully or partially, financially responsible for it. Ms. Lauderbaugh expressed understanding and agreed to proceed.   Historic Elements   Anita Gibbs is a 53 y.o. year old, female patient evaluated today after our last contact on 01/12/2024. Ms. 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), Adrenal nodule (HCC) (06/20/2020), Allergy , ANA positive (02/01/2021), Anxiety and depression (01/29/2021), Arthritis, Back pain with history of spinal surgery (01/29/2021), Bilateral carpal tunnel syndrome (01/29/2021), Bilateral leg edema (01/29/2021), BMI 40.0-44.9, adult (HCC) (04/30/2022), Carpal boss, right, Carpal tunnel syndrome, bilateral, 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) (09/27/2018), Cervical facet hypertrophy (C3-T1) (09/27/2018), Cervical facet joint syndrome (Bilateral) (L>R) (09/27/2018), 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-5, C5-6, and C6-7) (09/27/2018), Cervical myelopathy (HCC) (07/01/2017), Cervicalgia (Primary Area of Pain) (Bilateral) (L>R) (09/27/2018), Chronic gout of foot (Left) (05/31/2018), Chronic low back pain (Tertiary Area of Pain) (Bilateral) (R>L) w/ sciatica (Bilateral) (09/07/2018), Chronic low back pain (Tertiary Area of Pain) (Bilateral) (R>L) w/o sciatica (07/26/2018),  Chronic lower extremity pain (Fourth Area of Pain) (Bilateral) (R>L) (09/07/2018), Chronic musculoskeletal pain (10/27/2018), Chronic neck pain (Bilateral) w/ history of cervical spinal surgery (09/27/2018), Chronic neck pain (Primary Area of Pain) (Bilateral) (L>R) (09/07/2018), Chronic sacroiliac joint dysfunction (Bilateral) (09/27/2018), Chronic  sacroiliac joint pain (Right) (09/07/2018), Chronic upper extremity pain (Secondary Area of Pain) (Bilateral) (R>L) (09/27/2018), Disorder of skeletal system (09/07/2018), E. coli UTI (urinary tract infection) (06/20/2020), Elevated sed rate (09/08/2018), Elevated serum creatinine (06/20/2020), Excessive daytime sleepiness (03/06/2020), Female pelvic pain (09/05/2020), GERD (gastroesophageal reflux disease), Headache, History of allergy  to shellfish (09/28/2018), History of fusion of cervical spine (ACDF C4-C7) (09/27/2018), History of illicit drug use (03/07/2020), Hives (09/05/2020), Hives (09/05/2020), Hyperkalemia (05/31/2018), Hyperlipidemia (04/30/2022), Hypertension, Large breasts (05/30/2020), Left hip pain (07/21/2022), Lumbar facet arthropathy (04/20/2019), Lumbar radiculitis (L5 dermatome) (Right) (04/17/2020), Multiple environmental allergies (01/17/2020), Multiple food allergies (03/07/2020), Neurogenic pain (10/27/2018), Numbness and tingling of both feet (05/31/2018), 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), Other intervertebral disc degeneration, 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), Pharmacologic therapy (09/07/2018), Plantar fasciitis, bilateral (03/04/2021), Polyneuropathy (01/29/2021), Prediabetes (10/10/2019), Problems influencing health status (09/07/2018), Rash (08/08/2019), Seasonal allergic rhinitis due to pollen (11/29/2018), Seasonal allergies (03/06/2020), Seasonal asthma (11/29/2018), Sjogren's disease (HCC), Sleep apnea, Somatic dysfunction of sacroiliac joint (Bilateral) (09/27/2018), Spondylosis without myelopathy or  radiculopathy, cervical region (03/22/2019), Spondylosis without myelopathy or radiculopathy, lumbosacral region (05/19/2019), Spondylosis, cervical, 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 She also  has a past surgical history that includes Knee arthroscopy (Right); Anterior cervical decomp/discectomy fusion (N/A, 07/01/2017); Abdominal hysterectomy (10/17/2020); and Carpal tunnel release (Bilateral). Ms. Giammona has a current medication list which includes the following prescription(s): albuterol , amlodipine , atorvastatin , buprenorphine , buspirone , butalbital -acetaminophen -caffeine , cetirizine , vitamin d -3, cyanocobalamin , dexamethasone , dicyclomine , duloxetine , epinephrine , fluconazole , fluticasone , hydrocortisone , hydroxyzine , ibuprofen, losartan , melatonin, meloxicam , methocarbamol , montelukast , pantoprazole , polyethylene glycol powder, potassium chloride  sa, pregabalin , saccharomyces boulardii, zepbound , trazodone , and triamcinolone  cream. She  reports that she has never smoked. She has never used smokeless tobacco. She reports current alcohol use of about 2.0 standard drinks of alcohol per week. She reports that she does not use drugs. Ms. Mcclenahan is allergic to other, pineapple, shellfish allergy , strawberry (diagnostic), and tomato.  BMI: Estimated body mass index is 44.82 kg/m as calculated from the following:   Height as of 03/01/24: 5' 3 (1.6 m).   Weight as of 03/01/24: 253 lb (114.8 kg). Last encounter: 01/12/2024. Last procedure: Visit date not found.  HPI  Today, she is being contacted for a post-procedure assessment.  The patient reports limited response to lumbar radiofrequency ablation (RFA), stating her lower back pain has returned to baseline with no relief or improvement since the procedure.  She also noted that her insurance no longer covers the [place]  patch, so she has discontinue its use.  We discussed considering alternative medication management strategy at  her next visit.  Procedure Procedure: Lumbar Facet, Medial Branch Radiofrequency Ablation (RFA) #2  Laterality: Right (-RT)  Level: L3, L4, and L5 Medial Branch Level(s). These levels will denervate the L3-4 and L4-5 lumbar facet joints.  Imaging: Fluoroscopy-guided         Anesthesia: Local anesthesia (1-2% Lidocaine ) Sedation: Moderate Sedation                       DOS: 02/01/2024  Performed by: Wallie Sherry, MD   Purpose: Therapeutic/Palliative Indications: Low back pain severe enough to impact quality of life or function. Indications: 1. Lumbar facet syndrome (Bilateral) (R>L)   2. Lumbar facet arthropathy     Ms. Nessler has been dealing with the above chronic pain for longer than three months and has either failed to respond, was unable to tolerate, or simply did not get enough benefit from other more conservative therapies including, but not limited to: 1. Over-the-counter  medications 2. Anti-inflammatory medications 3. Muscle relaxants 4. Membrane stabilizers 5. Opioids 6. Physical therapy and/or chiropractic manipulation 7. Modalities (Heat, ice, etc.) 8. Invasive techniques such as nerve blocks. Ms. Calamia has attained more than 50% relief of the pain from a series of diagnostic injections conducted in separate occasions.   Pain Score: Pre-procedure: 5 /10 Post-procedure: 5/10  Post-Procedure Evaluation    Effectiveness:  Initial hour after procedure:   100%. Subsequent 4-6 hours post-procedure:   100%. Analgesia past initial 6 hours:   0%. Ongoing improvement:  Analgesic:  Ms. Knipp underwent a therapeutic/palliative Lumbar facet radiofrequency ablation (RFA) on February 01, 2024.  She reports experiencing approximately 100% pain relief during the local anesthetic phase; however, once the anesthesia wore off, her pain returned to baseline with no sustained relief or improvement. Function: Back to baseline ROM: Back to baseline  UDS:  Summary  Date Value Ref  Range Status  02/01/2020 Note  Final    Comment:    ==================================================================== [employer] [facility] 13 (MW) ==================================================================== Test                             Result       Flag       Units Drug Present not Declared for Prescription Verification   Carboxy-THC                    20           UNEXPECTED ng/mg creat    Carboxy-THC is a metabolite of tetrahydrocannabinol (THC). Source of    THC is most commonly herbal marijuana or marijuana-based products,    but THC is also present in a scheduled prescription medication.    Trace amounts of THC can be present in hemp and cannabidiol (CBD)    products. This test is not intended to distinguish between delta-9-    tetrahydrocannabinol, the predominant form of THC in most herbal or    marijuana-based products, and delta-8-tetrahydrocannabinol. Drug Absent but Declared for Prescription Verification   Oxycodone                       Not Detected UNEXPECTED ng/mg creat ==================================================================== Test                      Result    Flag   Units      Ref Range   Creatinine              230              mg/dL      >=79 ==================================================================== Declared Medications:  The flagging and interpretation on this report are based on the  following declared medications.  Unexpected results may arise from  inaccuracies in the declared medications.  **Note: The testing scope of this panel includes these medications:  Oxycodone  (Roxicodone )  **Note: The testing scope of this panel does not include the  following reported medications:  Albuterol  (Ventolin  HFA)  Amlodipine  (Norvasc )  Atorvastatin  (Lipitor)  Cetirizine   Diphenhydramine (Benadryl)  Epinephrine  (EpiPen )  Fluticasone  (Flonase )  Gabapentin  (Neurontin )  Hydrochlorothiazide   Losartan  (Cozaar )  Meloxicam  (Mobic )   Methocarbamol  (Robaxin )  Montelukast  (Singulair )  Norethindrone (Aygestin)  Pantoprazole  (Protonix )  Vitamin D3 ==================================================================== For clinical consultation, please call [phone]. ====================================================================    No results found for: CBDTHCR, D8THCCBX, D9THCCBX  Laboratory Chemistry Profile   Renal Lab Results  Component Value Date  BUN 15 11/18/2023   CREATININE 0.75 11/18/2023   BCR NOT APPLICABLE 02/13/2021   GFR 63.48 11/09/2023   GFRAA 76 09/07/2018   GFRNONAA >60 11/18/2023    Hepatic Lab Results  Component Value Date   AST 12 (L) 11/18/2023   ALT 12 11/18/2023   ALBUMIN 3.9 11/18/2023   ALKPHOS 45 11/18/2023   LIPASE 32.0 06/30/2022    Electrolytes Lab Results  Component Value Date   NA 138 11/18/2023   K 4.3 02/23/2024   CL 105 11/18/2023   CALCIUM  8.8 (L) 11/18/2023   MG 2.2 09/07/2018    Bone Lab Results  Component Value Date   VD25OH 29.35 (L) 04/08/2023   25OHVITD1 8.1 (L) 09/07/2018   25OHVITD2 <1.0 09/07/2018   25OHVITD3 8.1 09/07/2018    Inflammation (CRP: Acute Phase) (ESR: Chronic Phase) Lab Results  Component Value Date   CRP <1.0 07/22/2023   ESRSEDRATE 57 (H) 07/22/2023         Note: Above Lab results reviewed.  Imaging  MR CERVICAL SPINE WO CONTRAST CLINICAL DATA:  Chronic neck pain  EXAM: MRI CERVICAL SPINE WITHOUT CONTRAST  TECHNIQUE: Multiplanar, multisequence MR imaging of the cervical spine was performed. No intravenous contrast was administered.  COMPARISON:  CT of the cervical spine dated 12/14/2023  FINDINGS: Alignment: Physiologic.  Vertebrae: Anterior cervical fusion from C4 through C7. no acute fracture is identified.  Cord: Normal signal and morphology.  Posterior Fossa, vertebral arteries, paraspinal tissues: The visualized portions of the skull base and the posterior fossa are normal. No soft tissue  abnormality is identified.  Disc levels:  C2-C3: The disk is normal in configuration. No facet arthropathy. No uncovertebral joint disease. No neuroforaminal stenosis. No spinal canal stenosis.  C3-C4: Disc osteophyte complex. No facet arthropathy. Moderate left and mild right uncovertebral joint disease. Moderate left and mild right neuroforaminal stenosis. Mild spinal canal stenosis.  C4-C5: Disc osteophyte complex with interbody fusion. No facet arthropathy. Moderate left uncovertebral joint disease. Moderate left neuroforaminal stenosis. No spinal canal stenosis.  C5-C6: Disc osteophyte complex with interbody fusion. No facet arthropathy. Mild bilateral uncovertebral joint disease. Mild bilateral neuroforaminal stenosis. No spinal canal stenosis.  C6-C7: Disc osteophyte complex with interbody fusion. No facet arthropathy. No uncovertebral joint disease. No neuroforaminal stenosis. No spinal canal stenosis.  C7-T1: Disc osteophyte complex. No facet arthropathy. No uncovertebral joint disease. No neuroforaminal stenosis. No spinal canal stenosis.  IMPRESSION: 1. Anterior cervical fusion from C4 through C7. 2. Moderate left foraminal stenoses at C3-C4 and C4-C5 secondary to uncovertebral joint disease.  Electronically Signed   By: Clem Savory M.D.   On: 02/11/2024 11:49  Assessment   Diagnosis Status  1. Lumbar facet syndrome (Bilateral) (R>L)   2. Lumbar facet arthropathy   3. Chronic low back pain [facility] Area of Pain) (Bilateral) (R>L) w/ sciatica (Bilateral)   4. Chronic pain syndrome   5. Neurogenic pain   6. Lumbar radiculitis (L5 dermatome)    7. Chronic musculoskeletal pain       Plan of Care  Problem-specific:  We discussed considering alternative medication management strategy at her next visit.  Ms. ELVETA RAPE has a current medication list which includes the following long-term medication(s): albuterol , amlodipine , atorvastatin , cetirizine ,  dicyclomine , duloxetine , fluticasone , losartan , methocarbamol , montelukast , pantoprazole , potassium chloride  sa, pregabalin , and trazodone .    Follow-up plan:   No follow-ups on file.          Recent Visits Date Type Provider Dept  03/07/24 Appointment Marcelino Nurse,  MD [facility]  02/01/24 Procedure visit Marcelino Nurse, MD [facility]  01/12/24 Office Visit Zionah Criswell K, NP [facility]  Showing recent visits within past 90 days and meeting all other requirements Today's Visits Date Type Provider Dept  03/08/24 Appointment Mikah Rottinghaus K, NP [facility]  Showing today's visits and meeting all other requirements Future Appointments Date Type Provider Dept  04/07/24 Appointment Elwyn Lowden K, NP [facility]  Showing future appointments within next 90 days and meeting all other requirements  I discussed the assessment and treatment plan with the patient. The patient was provided an opportunity to ask questions and all were answered. The patient agreed with the plan and demonstrated an understanding of the instructions.  Patient advised to call back or seek an in-person evaluation if the symptoms or condition worsens.  Duration of encounter: 20 minutes.  Note by: Emmy MARLA Blanch, NP Date: 03/08/2024; Time: 1:13 PM

## 2024-03-08 NOTE — Telephone Encounter (Signed)
 I have notified the patient. Nothing further needed.

## 2024-03-08 NOTE — Telephone Encounter (Signed)
 Prior Auth for patients medication Zepbound  has been approved by [facility] Medicaid from 03/07/24  to  09/04/2024.  CoverMyMeds Key: [id number] [place] Case ID #: [id number]

## 2024-03-08 NOTE — Telephone Encounter (Signed)
 error

## 2024-03-09 ENCOUNTER — Ambulatory Visit (HOSPITAL_COMMUNITY): Admitting: Licensed Clinical Social Worker

## 2024-03-09 DIAGNOSIS — F332 Major depressive disorder, recurrent severe without psychotic features: Secondary | ICD-10-CM | POA: Diagnosis not present

## 2024-03-09 DIAGNOSIS — F411 Generalized anxiety disorder: Secondary | ICD-10-CM

## 2024-03-10 ENCOUNTER — Ambulatory Visit (HOSPITAL_COMMUNITY): Admitting: Licensed Clinical Social Worker

## 2024-03-10 ENCOUNTER — Ambulatory Visit: Admitting: Neurology

## 2024-03-10 DIAGNOSIS — F332 Major depressive disorder, recurrent severe without psychotic features: Secondary | ICD-10-CM | POA: Diagnosis not present

## 2024-03-10 DIAGNOSIS — R202 Paresthesia of skin: Secondary | ICD-10-CM | POA: Diagnosis not present

## 2024-03-10 DIAGNOSIS — F411 Generalized anxiety disorder: Secondary | ICD-10-CM | POA: Diagnosis not present

## 2024-03-10 DIAGNOSIS — Z981 Arthrodesis status: Secondary | ICD-10-CM

## 2024-03-10 NOTE — Addendum Note (Signed)
 Addended byBETHA HILMA HASTINGS on: 03/10/2024 03:05 PM   Modules accepted: Orders

## 2024-03-10 NOTE — Procedures (Signed)
 [facility]  [address] [place], [address]  [place], [place] [address] Tel: [phone] Fax: [phone] Test Date:  03/10/2024  Patient: Anita Gibbs DOB: Apr 18, 1971 Physician: Tonita Blanch, DO  Sex: Female Height: 5' 3 Ref Phys: Glade Boys, DEVONNA  ID#: [id number]   Technician:    History: This is a 53 year old female s/p C4-C7 ACDF (2018) referred for evaluation of bilateral arm pain and paresthesias.   NCV & EMG Findings: Extensive electrodiagnostic testing of the right upper extremity and additional studies of the left shows:  Bilateral median, ulnar, and mixed palmar sensory responses are within normal limits. Bilateral median and ulnar motor responses are within normal limits. There is no evidence of active or chronic motor axonal loss changes affecting any of the tested muscles.  Motor unit configuration and recruitment pattern is within normal limits.   Impression: This is a normal study of the upper extremities.  In particular, there is no evidence of carpal tunnel syndrome or a cervical radiculopathy.   ___________________________ Tonita Blanch, DO    Nerve Conduction Studies   Stim Site NR Peak (ms) Norm Peak (ms) O-P Amp (V) Norm O-P Amp  Left Median Anti Sensory (2nd Digit)  32 C  Wrist    2.9 <3.6 35.9 >15  Right Median Anti Sensory (2nd Digit)  32 C  Wrist    3.2 <3.6 29.0 >15  Left Ulnar Anti Sensory (5th Digit)  32 C  Wrist    2.8 <3.1 37.2 >10  Right Ulnar Anti Sensory (5th Digit)  32 C  Wrist    2.9 <3.1 39.1 >10     Stim Site NR Onset (ms) Norm Onset (ms) O-P Amp (mV) Norm O-P Amp Site1 Site2 Delta-0 (ms) Dist (cm) Vel (m/s) Norm Vel (m/s)  Left Median Motor (Abd Poll Brev)  32 C  Wrist    2.9 <4.0 11.7 >6 Elbow Wrist 4.9 28.0 57 >50  Elbow    7.8  11.6         Right Median Motor (Abd Poll Brev)  32 C  Wrist    3.2 <4.0 11.9 >6 Elbow Wrist 4.6 29.0 63 >50  Elbow    7.8  11.0         Left Ulnar Motor (Abd Dig Minimi)  32 C   Wrist    2.7 <3.1 9.3 >7 B Elbow Wrist 3.9 20.0 51 >50  B Elbow    6.6  9.2  A Elbow B Elbow 1.8 10.0 56 >50  A Elbow    8.4  8.9         Right Ulnar Motor (Abd Dig Minimi)  32 C  Wrist    2.6 <3.1 9.1 >7 B Elbow Wrist 4.2 23.0 55 >50  B Elbow    6.8  8.7  A Elbow B Elbow 1.8 10.0 56 >50  A Elbow    8.6  8.2            Stim Site NR Peak (ms) Norm Peak (ms) P-T Amp (V) Site1 Site2 Delta-P (ms) Norm Delta (ms)  Left Median/Ulnar Palm Comparison (Wrist - 8cm)  32 C  Median Palm    1.5 <2.2 49.6 Median Palm Ulnar Palm 0.2   Ulnar Palm    1.7 <2.2 11.6      Right Median/Ulnar Palm Comparison (Wrist - 8cm)  32 C  Median Palm    1.8 <2.2 45.9 Median Palm Ulnar Palm 0.1   Ulnar Palm    1.7 <  2.2 13.0       Electromyography   Side Muscle Ins.Act Fibs Fasc Recrt Amp Dur Poly Activation Comment  Right 1stDorInt Nml Nml Nml Nml Nml Nml Nml Nml N/A  Right PronatorTeres Nml Nml Nml Nml Nml Nml Nml Nml N/A  Right Biceps Nml Nml Nml Nml Nml Nml Nml Nml N/A  Right Triceps Nml Nml Nml Nml Nml Nml Nml Nml N/A  Right Deltoid Nml Nml Nml Nml Nml Nml Nml Nml N/A  Left 1stDorInt Nml Nml Nml Nml Nml Nml Nml Nml N/A  Left PronatorTeres Nml Nml Nml Nml Nml Nml Nml Nml N/A  Left Biceps Nml Nml Nml Nml Nml Nml Nml Nml N/A  Left Triceps Nml Nml Nml Nml Nml Nml Nml Nml N/A  Left Deltoid Nml Nml Nml Nml Nml Nml Nml Nml N/A      Waveforms:

## 2024-03-11 ENCOUNTER — Ambulatory Visit (HOSPITAL_COMMUNITY): Admitting: Licensed Clinical Social Worker

## 2024-03-11 DIAGNOSIS — F332 Major depressive disorder, recurrent severe without psychotic features: Secondary | ICD-10-CM

## 2024-03-11 DIAGNOSIS — F411 Generalized anxiety disorder: Secondary | ICD-10-CM

## 2024-03-11 NOTE — Progress Notes (Unsigned)
 Referring Physician:  Gretel App, NP [address],  [place] [address]  Primary Physician:  Gretel App, NP  History of Present Illness: 03/11/2024 [name] has a history of HTN, asthma, GERD, chronic pain syndrome, hyperlipidemia, obesity.   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 Clois.   She did phone visit with me on 01/04/24 to review her cervical CT scan. It showed that she is fused from C4-C7. Known mild DDD C3-C4 and C7-T1 on xrays.   Cervical MRI and EMG of upper extremities was ordered and she is here to review them.   She feels like her pain is worse. She has constant neck pain radiating down left arm to the hand causing numbness and tingling. She started with right arm pain about 2 months ago that has become more constant. She has weakness in both hands. She has numbness/tingling in right arm as well.   She notes she is dropping things and that her balance is off- she has chronic balance issues but thinks things are worse.   She sees pain management (Lateef) for medications and lumbar injections.   She has butrans  patch. She takes cymbalta , motrin, robaxin , and lyrica .   She does not smoke.   Bowel/Bladder Dysfunction: none  Conservative measures:  Physical therapy:  has not participated in PT for neck, is in PT for her back now Multimodal medical therapy including regular antiinflammatories:  Cymbalta , Methocarbamol , Lyrica , Meloxicam  Injections:  10/06/2022, 05/18/2023 L3-4 and L4-5 Radiofrequency Ablation 05/19/2022, 07/07/2022 L3-4, L4-5 Medial Branch Block  Past Surgery:  07/01/2017 C4-C7 ACDF by Dr. Clois  The symptoms are causing a significant impact on the patient's life.   Review of Systems:  A 10 point review of systems is negative, except for the pertinent positives and negatives detailed in the HPI.  Past Medical History: Past Medical History:  Diagnosis Date   Abdominal pain 06/11/2020   Abnormal drug screen  03/06/2020   Abnormal MRI, cervical spine (06/10/2018) 09/27/2018   FINDINGS:  Vertebrae: fusion hardware at C4, C5, C6, and C7.  Posterior Fossa, vertebral arteries, paraspinal tissues: A relatively empty sella present.     Disc levels:  C3-4: Negative. A mild broad-based disc osteophyte complex present. Uncovertebral spurring contributes to mild foraminal narrowing bilaterally.  C4-5: A leftward disc osteophyte complex is present. Uncovertebral and facet disease   Acute cystitis without hematuria 06/18/2020   Acute vaginitis 06/11/2020   Adenoma of left adrenal gland 08/01/2020   Adrenal nodule (HCC) 06/20/2020   Allergy     ANA positive 02/01/2021   Anxiety and depression 01/29/2021   Arthritis    Back pain with history of spinal surgery 01/29/2021   Bilateral carpal tunnel syndrome 01/29/2021   Bilateral leg edema 01/29/2021   BMI 40.0-44.9, adult (HCC) 04/30/2022   Carpal boss, right    Carpal tunnel syndrome, bilateral    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) 09/27/2018   Levels:  C4-5: There is partial effacement of ventral CSF.     IMPRESSION:  Mild residual central canal narrowing at C4-5 s/p ACDF.   Cervical facet hypertrophy (C3-T1) 09/27/2018   Levels:  C3-4: Uncovertebral spurring  C4-5: Uncovertebral and facet disease  C5-6: Residual uncovertebral disease.  C6-7: Residual uncovertebral disease.  C7-T1: Minimal left-sided uncovertebral spurring   Cervical facet joint syndrome (Bilateral) (L>R) 09/27/2018   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-5, C5-6, and C6-7) 09/27/2018   Levels:  C3-4: Mild foraminal narrowing bilaterally.  C4-5: Mild  left foraminal narrowing.  C5-6: Mild left foraminal narrowing is due to residual uncovertebral disease.  C6-7: Mild left foraminal narrowing is due to residual uncovertebral disease.   Cervical myelopathy (HCC) 07/01/2017   Cervicalgia (Primary Area of Pain) (Bilateral) (L>R) 09/27/2018   Chronic gout of foot (Left)  05/31/2018   Chronic low back pain [facility] Area of Pain) (Bilateral) (R>L) w/ sciatica (Bilateral) 09/07/2018   Chronic low back pain [facility] Area of Pain) (Bilateral) (R>L) w/o sciatica 07/26/2018   Chronic lower extremity pain (Fourth Area of Pain) (Bilateral) (R>L) 09/07/2018   Chronic musculoskeletal pain 10/27/2018   Chronic neck pain (Bilateral) w/ history of cervical spinal surgery 09/27/2018   Chronic neck pain (Primary Area of Pain) (Bilateral) (L>R) 09/07/2018   Chronic sacroiliac joint dysfunction (Bilateral) 09/27/2018   Chronic sacroiliac joint pain (Right) 09/07/2018   Chronic upper extremity pain (Secondary Area of Pain) (Bilateral) (R>L) 09/27/2018   Disorder of skeletal system 09/07/2018   E. coli UTI (urinary tract infection) 06/20/2020   Elevated sed rate 09/08/2018   Elevated serum creatinine 06/20/2020   Excessive daytime sleepiness 03/06/2020   Female pelvic pain 09/05/2020   GERD (gastroesophageal reflux disease)    Headache    History of allergy  to shellfish 09/28/2018   History of fusion of cervical spine (ACDF C4-C7) 09/27/2018   History of illicit drug use 03/07/2020   Hives 09/05/2020   Hives 09/05/2020   Hyperkalemia 05/31/2018   Mild   Hyperlipidemia 04/30/2022   Hypertension    Large breasts 05/30/2020   Left hip pain 07/21/2022   Lumbar facet arthropathy 04/20/2019   Lumbar radiculitis (L5 dermatome) (Right) 04/17/2020   Multiple environmental allergies 01/17/2020   Multiple food allergies 03/07/2020   Neurogenic pain 10/27/2018   Numbness and tingling of both feet 05/31/2018   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   Other intervertebral disc degeneration, lumbar region 11/15/2019   Pharmacologic therapy 09/07/2018   Plantar fasciitis, bilateral 03/04/2021   Polyneuropathy 01/29/2021   Prediabetes 10/10/2019   Problems influencing health status 09/07/2018   Rash 08/08/2019   Seasonal allergic rhinitis due to pollen  11/29/2018   Seasonal allergies 03/06/2020   Seasonal asthma 11/29/2018   Sjogren's disease (HCC)    Sleep apnea    Somatic dysfunction of sacroiliac joint (Bilateral) 09/27/2018   Spondylosis without myelopathy or radiculopathy, cervical region 03/22/2019   Spondylosis without myelopathy or radiculopathy, lumbosacral region 05/19/2019   Spondylosis, cervical, w/ myelopathy 09/27/2018   Vaginal discharge 06/11/2020    Past Surgical History: Past Surgical History:  Procedure Laterality Date   ABDOMINAL HYSTERECTOMY  10/17/2020   [facility]; she has both ovaries   ANTERIOR CERVICAL DECOMP/DISCECTOMY FUSION N/A 07/01/2017   Procedure: ANTERIOR CERVICAL DECOMPRESSION/DISCECTOMY FUSION 3 LEVELS;  Surgeon: Clois Fret, MD;  Location: [facility] ORS;  Service: Neurosurgery;  Laterality: N/A;   CARPAL TUNNEL RELEASE Bilateral    KNEE ARTHROSCOPY Right     Allergies: Allergies as of 03/16/2024 - Review Complete 03/08/2024  Allergen Reaction Noted   Other Hives 06/25/2017   Pineapple  01/29/2021   Shellfish allergy  Hives 06/25/2017   Strawberry (diagnostic) Hives 06/25/2017   Tomato Hives 06/25/2017    Medications: Outpatient Encounter Medications as of 03/16/2024  Medication Sig   albuterol  (VENTOLIN  HFA) 108 (90 Base) MCG/ACT inhaler Inhale 2 puffs into the lungs every 6 (six) hours as needed for wheezing or shortness of breath.   amLODipine  (NORVASC ) 5 MG tablet Take 1 tablet (5 mg total) by mouth daily.  atorvastatin  (LIPITOR) 10 MG tablet MWF at night   buprenorphine  (BUTRANS ) 15 MCG/HR Place 1 patch onto the skin once a week.   busPIRone  (BUSPAR ) 10 MG tablet Take 2 tablets (20 mg total) by mouth 2 (two) times daily.   butalbital -acetaminophen -caffeine  (FIORICET) 50-325-40 MG tablet Take 1 tablet by mouth every 6 (six) hours as needed for headache.   cetirizine  (ZYRTEC  ALLERGY ) 10 MG tablet Take 1 tablet (10 mg total) by mouth at bedtime as needed.   Cholecalciferol (VITAMIN D -3)  125 MCG (5000 UT) TABS Take by mouth daily.   Cyanocobalamin  (B-12 PO) Take by mouth.   dexamethasone  (DECADRON ) 1 MG tablet Take 1 mg by mouth once.   dicyclomine  (BENTYL ) 20 MG tablet TAKE 1 TABLET BY MOUTH FOUR TIMES DAILY (BEFORE MEALS AND AT BEDTIME)   DULoxetine  (CYMBALTA ) 60 MG capsule Take 1 capsule (60 mg total) by mouth 2 (two) times daily.   EPINEPHrine  (AUVI-Q ) 0.3 mg/0.3 mL IJ SOAJ injection Use as directed for severe allergic reaction   fluconazole  (DIFLUCAN ) 150 MG tablet Take 1 tablet by mouth x 1 dose. May repeat in 72 hours if needed. (Patient not taking: Reported on 03/08/2024)   fluticasone  (FLONASE ) 50 MCG/ACT nasal spray Place 2 sprays into both nostrils daily.   hydrocortisone  2.5 % cream Apply topically 2 (two) times daily. Prn to eyelids   hydrOXYzine  (VISTARIL ) 25 MG capsule Take 1 capsule (25 mg total) by mouth 3 (three) times daily.   ibuprofen (ADVIL) 800 MG tablet Take 800 mg by mouth 3 (three) times daily.   losartan  (COZAAR ) 100 MG tablet Take 1 tablet (100 mg total) by mouth daily.   Melatonin 10 MG CAPS Take 10 mg by mouth at bedtime.   meloxicam  (MOBIC ) 15 MG tablet Take 1 tablet (15 mg total) by mouth every other day as needed. (Patient not taking: Reported on 03/08/2024)   methocarbamol  (ROBAXIN ) 750 MG tablet Take 1 tablet (750 mg total) by mouth every 8 (eight) hours as needed for muscle spasms.   montelukast  (SINGULAIR ) 10 MG tablet Take 1 tablet (10 mg total) by mouth at bedtime.   pantoprazole  (PROTONIX ) 40 MG tablet TAKE ONE TABLET EVERY DAY 30 MIN BEFORE DINNER   polyethylene glycol powder (GLYCOLAX /MIRALAX ) 17 GM/SCOOP powder Take 17 g by mouth 2 (two) times daily as needed.   potassium chloride  SA (KLOR-CON  M) 20 MEQ tablet Take 1 tablet (20 mEq total) by mouth daily as needed. With lasix    pregabalin  (LYRICA ) 100 MG capsule Take 1 capsule (100 mg total) by mouth 3 (three) times daily.   saccharomyces boulardii (FLORASTOR) 250 MG capsule Take 1 capsule  (250 mg total) by mouth daily.   tirzepatide  (ZEPBOUND ) 2.5 MG/0.5ML Pen Inject 2.5 mg into the skin once a week. (Patient not taking: Reported on 03/08/2024)   traZODone  (DESYREL ) 50 MG tablet Take 1-2 tablets (50-100 mg total) by mouth at bedtime as needed for sleep.   triamcinolone  cream (KENALOG ) 0.1 % Apply 1 Application topically 2 (two) times daily.   No facility-administered encounter medications on file as of 03/16/2024.    Social History: Social History   Tobacco Use   Smoking status: Never   Smokeless tobacco: Never  Vaping Use   Vaping status: Never Used  Substance Use Topics   Alcohol use: Yes    Alcohol/week: 2.0 standard drinks of alcohol    Types: 2 Glasses of wine per week    Comment: occ   Drug use: No    Family  Medical History: Family History  Problem Relation Age of Onset   [name] Mother    Diabetes Mother    Arthritis Mother    Hypertension Mother    Hyperlipidemia Mother    Hypertension Father    Arthritis Father    Hearing loss Father    Kidney disease Father    Lymphoma Father        large b cell    Arthritis Sister    Hypertension Sister    Cancer Brother        ? lung due to exposure    COPD Brother    Early death Brother    Post-traumatic stress disorder Brother    Breast cancer Maternal Aunt    Breast cancer Paternal Aunt    Sleep apnea Daughter    COPD Son    Depression Son    Post-traumatic stress disorder Son    Allergic rhinitis Neg Hx    Angioedema Neg Hx    Eczema Neg Hx    Urticaria Neg Hx    Asthma Neg Hx     Physical Examination: There were no vitals filed for this visit.    Awake, alert, oriented to person, place, and time.  Speech is clear and fluent. Fund of knowledge is appropriate.   Cranial Nerves: Pupils equal round and reactive to light.  Facial tone is symmetric.    Well healed cervical incision.   No abnormal lesions on exposed skin.   Strength: Side Biceps Triceps Deltoid Interossei Grip Wrist Ext.  Wrist Flex.  R 5 5 5 5 5 5 5   L 5 5 5 5 5 5 5    Side Iliopsoas Quads Hamstring PF DF EHL  R 5 5 5 5 5 5   L 5 5 5 5 5 5    Reflexes are 3+ and symmetric at the biceps, brachioradialis, patella and achilles.   Hoffman's is negative bilaterally.  Clonus is not present.   Bilateral upper and lower extremity sensation is intact to light touch.     Gait is slow.   Medical Decision Making  Imaging: Cervical MRI dated 01/12/24:  FINDINGS: Alignment: Physiologic.   Vertebrae: Anterior cervical fusion from C4 through C7. no acute fracture is identified.   Cord: Normal signal and morphology.   Posterior Fossa, vertebral arteries, paraspinal tissues: The visualized portions of the skull base and the posterior fossa are normal. No soft tissue abnormality is identified.   Disc levels:   C2-C3: The disk is normal in configuration. No facet arthropathy. No uncovertebral joint disease. No neuroforaminal stenosis. No spinal canal stenosis.   C3-C4: Disc osteophyte complex. No facet arthropathy. Moderate left and mild right uncovertebral joint disease. Moderate left and mild right neuroforaminal stenosis. Mild spinal canal stenosis.   C4-C5: Disc osteophyte complex with interbody fusion. No facet arthropathy. Moderate left uncovertebral joint disease. Moderate left neuroforaminal stenosis. No spinal canal stenosis.   C5-C6: Disc osteophyte complex with interbody fusion. No facet arthropathy. Mild bilateral uncovertebral joint disease. Mild bilateral neuroforaminal stenosis. No spinal canal stenosis.   C6-C7: Disc osteophyte complex with interbody fusion. No facet arthropathy. No uncovertebral joint disease. No neuroforaminal stenosis. No spinal canal stenosis.   C7-T1: Disc osteophyte complex. No facet arthropathy. No uncovertebral joint disease. No neuroforaminal stenosis. No spinal canal stenosis.   IMPRESSION: 1. Anterior cervical fusion from C4 through C7. 2. Moderate left  foraminal stenoses at C3-C4 and C4-C5 secondary to uncovertebral joint disease.     Electronically Signed   By:  Clem Savory M.D.   On: 02/11/2024 11:49  I have personally reviewed the images and agree with the above interpretation.   EMG of bilateral upper extremities dated 03/10/24:  NCV & EMG Findings: Extensive electrodiagnostic testing of the right upper extremity and additional studies of the left shows:  Bilateral median, ulnar, and mixed palmar sensory responses are within normal limits. Bilateral median and ulnar motor responses are within normal limits. There is no evidence of active or chronic motor axonal loss changes affecting any of the tested muscles.  Motor unit configuration and recruitment pattern is within normal limits.     Impression: This is a normal study of the upper extremities.  In particular, there is no evidence of carpal tunnel syndrome or a cervical radiculopathy.     ___________________________ Tonita Blanch, DO   Assessment and Plan: Anita Gibbs is s/p C4-C7 ACDF on 07/01/17 by Dr. Clois.    She feels like her pain is worse. She has constant neck pain radiating down left arm to the hand causing numbness and tingling. She started with right arm pain about 2 months ago that has become more constant. She has weakness in both hands. She has numbness/tingling in right arm as well.    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   MRI shows moderate left/mild right foraminal stenosis C3-C4 along with moderate left foraminal stenosis C4-C5.  EMG of upper extremities is normal.   Previously seen left arm weakness has improved. She continues with hyper reflexia. She is dropping things and having some balance issues. No significant spinal stenosis seen on cervical MRI.    Treatment options discussed with patient and following plan made:    - Discuss possible cervical injections with Dr. Lateef. Referral  sent.  - Discussed PT for cervical spine and she declines. In PT now for lumbar spine and it is making her worse.  - Follow up with me in September for a MyChart visit.  - If she continues with dexterity/balance issues, may consider referral to neurology.   I spent a total of 30 minutes in face-to-face and non-face-to-face activities related to this patient's care today including review of outside records, review of imaging, review of symptoms, physical exam, discussion of differential diagnosis, discussion of treatment options, and documentation.   Glade Boys PA-C Dept. of Neurosurgery

## 2024-03-12 DIAGNOSIS — Z419 Encounter for procedure for purposes other than remedying health state, unspecified: Secondary | ICD-10-CM | POA: Diagnosis not present

## 2024-03-13 NOTE — Psych (Signed)
 Virtual Visit via Video Note  I connected with Anita Gibbs on 03/03/24 at  9:00 AM EDT by a video enabled telemedicine application and verified that I am speaking with the correct person using two identifiers.  Location: Patient: Patient home Provider: clinical home office   I discussed the limitations of evaluation and management by telemedicine and the availability of in person appointments. The patient expressed understanding and agreed to proceed.  I discussed the assessment and treatment plan with the patient. The patient was provided an opportunity to ask questions and all were answered. The patient agreed with the plan and demonstrated an understanding of the instructions.   The patient was advised to call back or seek an in-person evaluation if the symptoms worsen or if the condition fails to improve as anticipated.  Pt was provided 240 minutes of non-face-to-face time during this encounter.   Randall Bastos, LCSW   [facility] PHP THERAPIST PROGRESS NOTE  Anita Gibbs [id number]  Session Time: 9:00 - 10:00  Participation Level: Active  Behavioral Response: CasualAlertDepressed  Type of Therapy: Group Therapy  Treatment Goals addressed: Coping  Progress Towards Goals: Initial  Interventions: CBT, DBT, Supportive, and Reframing  Summary: Anita Gibbs is a 53 y.o. female who presents with depression and anxiety symptoms.  Clinician led check-in regarding current stressors and situation, and review of patient completed daily inventory. Clinician utilized active listening and empathetic response and validated patient emotions. Clinician facilitated processing group on pertinent issues.   Therapist Response: Patient arrived within time allowed. Patient rates her mood at a 5 on a scale of 1-10 with 10 being best. Pt states she feels not too good. Pt states she slept 4 hours and ate 2x. Pt reports yesterday was a high pain day and she was unable to get out of bed. Pt  states pain is more normal today, high but what she's used to. Pt reports struggling with spiraling thoughts yesterday and finding it difficult to get out of her head on high pain days. Pt shares she did not talk to anyone and has been isolating more recently.  Pt identifies feelings of hopelessness and denies SI. Pt able to process. Pt engaged in discussion.          Session Time: 10:00 am - 11:00 am   Participation Level: Active   Behavioral Response: CasualAlertDepressed   Type of Therapy: Group Therapy   Treatment Goals addressed: Coping   Progress Towards Goals: Progressing   Interventions: CBT, DBT, Solution Focused, Strength-based, Supportive, and Reframing   Summary: Cln continued topic of DBT distress tolerance skills and introduced Progressive Muscle Relaxation from the TIP skills. Cln led practice of PMR and discussed ideal ways in which the skill can be utilized to force calm into the body when escalated.    Therapist Response: Pt engaged in practice and reports understanding of PMR.              Session Time: 11:00 -12:00   Participation Level: Active   Behavioral Response: CasualAlertDepressed   Type of Therapy: Group Therapy   Treatment Goals addressed: Coping   Progress Towards Goals: Progressing   Interventions: CBT, DBT, Solution Focused, Strength-based, Supportive, and Reframing   Summary: Cln continued discussion on communication and ways to communicate in difficult or delicate situations. Group brainstormed together ways to approach difficult conversations and cln utilized boundaries and I statements to inform discussion. Group discussed the strategies and which were most problematic for them. Cln shaped discussion  and provided clarity and guidance in how to apply.    Therapist Response: Pt engaged in discussion and reports increased confidence in healthy communication.              Session Time: 12:00 -1:00   Participation Level: Active    Behavioral Response: CasualAlertDepressed   Type of Therapy: Group therapy   Treatment Goals addressed: Coping   Progress Towards Goals: Progressing   Interventions: CBT, DBT, Solution Focused, Strength-based, Supportive, and Reframing   Summary: 12:00 - 12:50: Cln led discussion on ways to manage stressors and feelings over the long weekend. Group members  brainstormed things to do over the weekend for multiple levels of energy, access, and moods. Cln reviewed crisis services should they be needed and provided pt's with the text crisis line, mobile crisis, national suicide hotline, [facility] 24/7 line, and information on [facility].  12:50 - 1:00 Clinician assessed for immediate needs, medication compliance and efficacy, and safety concerns.     Therapist Response: 12:00 - 12:50:  Pt engaged in discussion and is able to identify 3 ideas of what to do over the weekend to keep their mind engaged.  12:50 - 1:00 pm: At check-out, patient reports no immediate concerns. Patient demonstrates progress as evidenced by participation in first group session. Patient denies SI/HI/self-harm thoughts at the end of group.    Suicidal/Homicidal: Nowithout intent/plan  Plan: Pt will continue in PHP while working to decrease depression and anxiety symptoms, increase daily functioning, and increase ability to manage symptoms in a healthy manner.   Collaboration of Care: Medication Management AEB T Lewis, NP  Patient/Guardian was advised Release of Information must be obtained prior to any record release in order to collaborate their care with an outside provider. Patient/Guardian was advised if they have not already done so to contact the registration department to sign all necessary forms in order for us  to release information regarding their care.   Consent: Patient/Guardian gives verbal consent for treatment and assignment of benefits for services provided during this visit. Patient/Guardian expressed  understanding and agreed to proceed.   Diagnosis: Severe recurrent major depression without psychotic features (HCC) [F33.2]    1. Severe recurrent major depression without psychotic features (HCC)   2. GAD (generalized anxiety disorder)       Randall Bastos, LCSW

## 2024-03-13 NOTE — Psych (Signed)
 Virtual Visit via Video Note  I connected with Anita Gibbs on 03/07/24 at  9:00 AM EDT by a video enabled telemedicine application and verified that I am speaking with the correct person using two identifiers.  Location: Patient: Patient home Provider: clinical home office   I discussed the limitations of evaluation and management by telemedicine and the availability of in person appointments. The patient expressed understanding and agreed to proceed.  I discussed the assessment and treatment plan with the patient. The patient was provided an opportunity to ask questions and all were answered. The patient agreed with the plan and demonstrated an understanding of the instructions.   The patient was advised to call back or seek an in-person evaluation if the symptoms worsen or if the condition fails to improve as anticipated.  Pt was provided 240 minutes of non-face-to-face time during this encounter.   Randall Bastos, LCSW   [facility] PHP THERAPIST PROGRESS NOTE  Anita Gibbs [id number]  Session Time: 9:00 - 10:00  Participation Level: Active  Behavioral Response: CasualAlertDepressed  Type of Therapy: Group Therapy  Treatment Goals addressed: Coping  Progress Towards Goals: Initial  Interventions: CBT, DBT, Supportive, and Reframing  Summary: Anita Gibbs is a 53 y.o. female who presents with depression and anxiety symptoms.  Clinician led check-in regarding current stressors and situation, and review of patient completed daily inventory. Clinician utilized active listening and empathetic response and validated patient emotions. Clinician facilitated processing group on pertinent issues.   Therapist Response: Patient arrived within time allowed. Patient rates her mood at a 5 on a scale of 1-10 with 10 being best. Pt states she feels okay. Pt states she slept 5 hours and ate 1x. Pt reports her weekend was decent. Pt states she skipped a family event on Friday and  feels good about that decision. Pt states she spent Saturday with her friend's family and had a great time. Pt reports Sunday was a rest day because she exerted too much on Saturday. Pt struggles with accepting limits. Pt able to process. Pt engaged in discussion.          Session Time: 10:00 am - 11:00 am   Participation Level: Active   Behavioral Response: CasualAlertDepressed   Type of Therapy: Group Therapy   Treatment Goals addressed: Coping   Progress Towards Goals: Progressing   Interventions: CBT, DBT, Solution Focused, Strength-based, Supportive, and Reframing   Summary: Cln led processing group for pt's current struggles. Group members shared stressors and provided support and feedback. Cln brought in topics of boundaries, healthy relationships, and unhealthy thought processes to inform discussion.    Therapist Response:  Pt able to process and provide support to group.          Session Time: 11:00 -12:00   Participation Level: Active   Behavioral Response: CasualAlertDepressed   Type of Therapy: Group Therapy   Treatment Goals addressed: Coping   Progress Towards Goals: Progressing   Interventions: CBT, DBT, Solution Focused, Strength-based, Supportive, and Reframing   Summary: Cln led discussion on reverting to old behaviors. Group members discussed ways in which they feel they have reverted currently or in the past. Group members report fear of reverting to old behaviors and they struggle to manage that fear. Cln informed discussion with CBT thought challenging and DBT distress tolerance skills.    Therapist Response:  Pt engaged in discussion.          Session Time: 12:00 -1:00   Participation Level: Active  Behavioral Response: CasualAlertDepressed   Type of Therapy: Group therapy   Treatment Goals addressed: Coping   Progress Towards Goals: Progressing   Interventions: OT group   Summary: 12:00 - 12:50: Occupational Therapy group with  cln E. Hollan.  12:50 - 1:00 Clinician assessed for immediate needs, medication compliance and efficacy, and safety concerns.   Therapist Response: 12:00 - 12:50: Pt participated 12:50 - 1:00 pm: At check-out, patient reports no immediate concerns. Patient demonstrates progress as evidenced by continued engagement and responsiveness to treatment. Patient denies SI/HI/self-harm thoughts at the end of group.    Suicidal/Homicidal: Nowithout intent/plan  Plan: Pt will continue in PHP while working to decrease depression and anxiety symptoms, increase daily functioning, and increase ability to manage symptoms in a healthy manner.   Collaboration of Care: Medication Management AEB T Lewis, NP  Patient/Guardian was advised Release of Information must be obtained prior to any record release in order to collaborate their care with an outside provider. Patient/Guardian was advised if they have not already done so to contact the registration department to sign all necessary forms in order for us  to release information regarding their care.   Consent: Patient/Guardian gives verbal consent for treatment and assignment of benefits for services provided during this visit. Patient/Guardian expressed understanding and agreed to proceed.   Diagnosis: Severe recurrent major depression without psychotic features (HCC) [F33.2]    1. Severe recurrent major depression without psychotic features (HCC)   2. GAD (generalized anxiety disorder)   3. Insomnia, unspecified type       Randall Bastos, LCSW

## 2024-03-14 ENCOUNTER — Ambulatory Visit (HOSPITAL_COMMUNITY): Admitting: Licensed Clinical Social Worker

## 2024-03-14 DIAGNOSIS — F411 Generalized anxiety disorder: Secondary | ICD-10-CM | POA: Diagnosis not present

## 2024-03-14 DIAGNOSIS — F332 Major depressive disorder, recurrent severe without psychotic features: Secondary | ICD-10-CM | POA: Diagnosis not present

## 2024-03-14 NOTE — Psych (Signed)
 Virtual Visit via Video Note  I connected with Anita Gibbs on 03/08/24 at  9:00 AM EDT by a video enabled telemedicine application and verified that I am speaking with the correct person using two identifiers.  Location: Patient: Patient home Provider: clinical home office   I discussed the limitations of evaluation and management by telemedicine and the availability of in person appointments. The patient expressed understanding and agreed to proceed.  I discussed the assessment and treatment plan with the patient. The patient was provided an opportunity to ask questions and all were answered. The patient agreed with the plan and demonstrated an understanding of the instructions.   The patient was advised to call back or seek an in-person evaluation if the symptoms worsen or if the condition fails to improve as anticipated.  Pt was provided 240 minutes of non-face-to-face time during this encounter.   Randall Bastos, LCSW   [facility] PHP THERAPIST PROGRESS NOTE  Anita Gibbs [id number]  Session Time: 9:00 - 10:00  Participation Level: Active  Behavioral Response: CasualAlertDepressed  Type of Therapy: Group Therapy  Treatment Goals addressed: Coping  Progress Towards Goals: Initial  Interventions: CBT, DBT, Supportive, and Reframing  Summary: Anita Gibbs is a 53 y.o. female who presents with depression and anxiety symptoms.  Clinician led check-in regarding current stressors and situation, and review of patient completed daily inventory. Clinician utilized active listening and empathetic response and validated patient emotions. Clinician facilitated processing group on pertinent issues.   Therapist Response: Patient arrived within time allowed. Patient rates her mood at a 3 on a scale of 1-10 with 10 being best. Pt states she feels not good. Pt states she slept 2 hours and ate 1x. Pt reports today is a high pain day and she is also feeling nauseous. Pt reports she  struggled to get out of bed and will be going back after group. Pt shares frustration and struggle with having to choose between doing a task she may enjoy and being incapacitated the next day. Pt states she grilled with her grandchild yesterday and that sparked the pain higher today. Pt reports further irritability due to her pain doctor no showing her appointment yesterday.  Pt struggles to manage feelings on the anger spectrum. Pt able to process. Pt engaged in discussion.          Session Time: 10:00 am - 11:00 am   Participation Level: Active   Behavioral Response: CasualAlertDepressed   Type of Therapy: Group Therapy   Treatment Goals addressed: Coping   Progress Towards Goals: Progressing   Interventions: CBT, DBT, Solution Focused, Strength-based, Supportive, and Reframing   Summary: Cln led discussion on personal standards and they way in which it impacts the way we view ourselves and our abilities. Group members discussed judgment, struggles, and barriers they experience in terms of personal standards. Cln brought in topics of balance, grace, and kindness. Cln proposed the best friend test as a way to calibrate whether we are viewing our situation with kindness or harshness.    Therapist Response:  Pt engaged in discussion and reports willingness to utilize the best friend test.          Session Time: 11:00 -12:00   Participation Level: Active   Behavioral Response: CasualAlertDepressed   Type of Therapy: Group Therapy   Treatment Goals addressed: Coping   Progress Towards Goals: Progressing   Interventions: CBT, DBT, Solution Focused, Strength-based, Supportive, and Reframing   Summary: Cln led discussion on work  arounds or finding intermediate solutions while we are in the process of working on a final solution. Group members shared situations in which they are struggling with an issue that they are unable to fix quickly. Cln encouraged pt's to consider ways to  make things doable and not let perfect stand in the way.    Therapist Response:  Pt engaged in discussion and is able to connect with topic.          Session Time: 12:00 -1:00   Participation Level: Active   Behavioral Response: CasualAlertDepressed   Type of Therapy: Group therapy   Treatment Goals addressed: Coping   Progress Towards Goals: Progressing   Interventions: OT group   Summary: 12:00 - 12:50: Occupational Therapy group with cln E. Hollan.  12:50 - 1:00 Clinician assessed for immediate needs, medication compliance and efficacy, and safety concerns.   Therapist Response: 12:00 - 12:50: Pt participated 12:50 - 1:00 pm: At check-out, patient reports no immediate concerns. Patient demonstrates progress as evidenced by continued engagement and responsiveness to treatment. Patient denies SI/HI/self-harm thoughts at the end of group.    Suicidal/Homicidal: Nowithout intent/plan  Plan: Pt will continue in PHP while working to decrease depression and anxiety symptoms, increase daily functioning, and increase ability to manage symptoms in a healthy manner.   Collaboration of Care: Medication Management AEB T Lewis, NP  Patient/Guardian was advised Release of Information must be obtained prior to any record release in order to collaborate their care with an outside provider. Patient/Guardian was advised if they have not already done so to contact the registration department to sign all necessary forms in order for us  to release information regarding their care.   Consent: Patient/Guardian gives verbal consent for treatment and assignment of benefits for services provided during this visit. Patient/Guardian expressed understanding and agreed to proceed.   Diagnosis: Severe recurrent major depression without psychotic features (HCC) [F33.2]    1. Severe recurrent major depression without psychotic features (HCC)   2. GAD (generalized anxiety disorder)       Randall Bastos, LCSW

## 2024-03-15 ENCOUNTER — Ambulatory Visit (HOSPITAL_COMMUNITY): Admitting: Licensed Clinical Social Worker

## 2024-03-15 DIAGNOSIS — F411 Generalized anxiety disorder: Secondary | ICD-10-CM | POA: Diagnosis not present

## 2024-03-15 DIAGNOSIS — F332 Major depressive disorder, recurrent severe without psychotic features: Secondary | ICD-10-CM | POA: Diagnosis not present

## 2024-03-15 NOTE — Progress Notes (Addendum)
 Virtual Visit via Video Note  I connected with Anita Gibbs on 03/15/24 at  9:00 AM EDT by a video enabled telemedicine application and verified that I am speaking with the correct person using two identifiers.  Location: Patient: Home Provider: Office   I discussed the limitations of evaluation and management by telemedicine and the availability of in person appointments. The patient expressed understanding and agreed to proceed.    I discussed the assessment and treatment plan with the patient. The patient was provided an opportunity to ask questions and all were answered. The patient agreed with the plan and demonstrated an understanding of the instructions.   The patient was advised to call back or seek an in-person evaluation if the symptoms worsen or if the condition fails to improve as anticipated.  I provided 15 minutes of non-face-to-face time during this encounter.   Anita LOISE Kerns, NP   BH MD/PA/NP OP Progress Note  03/15/2024 9:05 AM Anita Gibbs  MRN:  [id number]  Chief Complaint: Depression and anxiety  HPI: Anita Gibbs is a 53 year old African-American female who with a history related to depression and anxiety.  States her main stressors related to chronic pain which has interrupted her day to day activities.  She reports some sleep disturbance however, is due to sharp shooting pain that is worse at night.  She reports that she is able to get her pain under control her symptoms may improve.    Today she is rating her depression and anxiety 6 out of 10 with 10 being the worst.  She reports a decreased appetite however she attributes that to medication called stepdown for diabetes/weight loss.  No concerns related to suicidal or homicidal ideations.  Denies auditory visual hallucinations.  Mood congruent with affect.  She answered all questions appropriately.  Will continue to monitor weekly while attending partial hospitalization programming.   Visit  Diagnosis:    ICD-10-CM   1. Severe recurrent major depression without psychotic features (HCC)  F33.2     2. GAD (generalized anxiety disorder)  F41.1       Past Psychiatric History:   Past Medical History:  Past Medical History:  Diagnosis Date   Abdominal pain 06/11/2020   Abnormal drug screen 03/06/2020   Abnormal MRI, cervical spine (06/10/2018) 09/27/2018   FINDINGS:  Vertebrae: fusion hardware at C4, C5, C6, and C7.  Posterior Fossa, vertebral arteries, paraspinal tissues: A relatively empty sella present.     Disc levels:  C3-4: Negative. A mild broad-based disc osteophyte complex present. Uncovertebral spurring contributes to mild foraminal narrowing bilaterally.  C4-5: A leftward disc osteophyte complex is present. Uncovertebral and facet disease   Acute cystitis without hematuria 06/18/2020   Acute vaginitis 06/11/2020   Adenoma of left adrenal gland 08/01/2020   Adrenal nodule (HCC) 06/20/2020   Allergy     ANA positive 02/01/2021   Anxiety and depression 01/29/2021   Arthritis    Back pain with history of spinal surgery 01/29/2021   Bilateral carpal tunnel syndrome 01/29/2021   Bilateral leg edema 01/29/2021   BMI 40.0-44.9, adult (HCC) 04/30/2022   Carpal boss, right    Carpal tunnel syndrome, bilateral    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) 09/27/2018   Levels:  C4-5: There is partial effacement of ventral CSF.     IMPRESSION:  Mild residual central canal narrowing at C4-5 s/p ACDF.   Cervical facet hypertrophy (C3-T1) 09/27/2018   Levels:  C3-4: Uncovertebral spurring  C4-5: Uncovertebral and facet disease  C5-6: Residual uncovertebral disease.  C6-7: Residual uncovertebral disease.  C7-T1: Minimal left-sided uncovertebral spurring   Cervical facet joint syndrome (Bilateral) (L>R) 09/27/2018   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-5, C5-6, and C6-7) 09/27/2018   Levels:  C3-4: Mild foraminal narrowing bilaterally.  C4-5: Mild left foraminal  narrowing.  C5-6: Mild left foraminal narrowing is due to residual uncovertebral disease.  C6-7: Mild left foraminal narrowing is due to residual uncovertebral disease.   Cervical myelopathy (HCC) 07/01/2017   Cervicalgia (Primary Area of Pain) (Bilateral) (L>R) 09/27/2018   Chronic gout of foot (Left) 05/31/2018   Chronic low back pain [facility] Area of Pain) (Bilateral) (R>L) w/ sciatica (Bilateral) 09/07/2018   Chronic low back pain [facility] Area of Pain) (Bilateral) (R>L) w/o sciatica 07/26/2018   Chronic lower extremity pain (Fourth Area of Pain) (Bilateral) (R>L) 09/07/2018   Chronic musculoskeletal pain 10/27/2018   Chronic neck pain (Bilateral) w/ history of cervical spinal surgery 09/27/2018   Chronic neck pain (Primary Area of Pain) (Bilateral) (L>R) 09/07/2018   Chronic sacroiliac joint dysfunction (Bilateral) 09/27/2018   Chronic sacroiliac joint pain (Right) 09/07/2018   Chronic upper extremity pain (Secondary Area of Pain) (Bilateral) (R>L) 09/27/2018   Disorder of skeletal system 09/07/2018   E. coli UTI (urinary tract infection) 06/20/2020   Elevated sed rate 09/08/2018   Elevated serum creatinine 06/20/2020   Excessive daytime sleepiness 03/06/2020   Female pelvic pain 09/05/2020   GERD (gastroesophageal reflux disease)    Headache    History of allergy  to shellfish 09/28/2018   History of fusion of cervical spine (ACDF C4-C7) 09/27/2018   History of illicit drug use 03/07/2020   Hives 09/05/2020   Hives 09/05/2020   Hyperkalemia 05/31/2018   Mild   Hyperlipidemia 04/30/2022   Hypertension    Large breasts 05/30/2020   Left hip pain 07/21/2022   Lumbar facet arthropathy 04/20/2019   Lumbar radiculitis (L5 dermatome) (Right) 04/17/2020   Multiple environmental allergies 01/17/2020   Multiple food allergies 03/07/2020   Neurogenic pain 10/27/2018   Numbness and tingling of both feet 05/31/2018   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   Other  intervertebral disc degeneration, lumbar region 11/15/2019   Pharmacologic therapy 09/07/2018   Plantar fasciitis, bilateral 03/04/2021   Polyneuropathy 01/29/2021   Prediabetes 10/10/2019   Problems influencing health status 09/07/2018   Rash 08/08/2019   Seasonal allergic rhinitis due to pollen 11/29/2018   Seasonal allergies 03/06/2020   Seasonal asthma 11/29/2018   Sjogren's disease (HCC)    Sleep apnea    Somatic dysfunction of sacroiliac joint (Bilateral) 09/27/2018   Spondylosis without myelopathy or radiculopathy, cervical region 03/22/2019   Spondylosis without myelopathy or radiculopathy, lumbosacral region 05/19/2019   Spondylosis, cervical, w/ myelopathy 09/27/2018   Vaginal discharge 06/11/2020    Past Surgical History:  Procedure Laterality Date   ABDOMINAL HYSTERECTOMY  10/17/2020   [facility]; she has both ovaries   ANTERIOR CERVICAL DECOMP/DISCECTOMY FUSION N/A 07/01/2017   Procedure: ANTERIOR CERVICAL DECOMPRESSION/DISCECTOMY FUSION 3 LEVELS;  Surgeon: Clois Fret, MD;  Location: [facility] ORS;  Service: Neurosurgery;  Laterality: N/A;   CARPAL TUNNEL RELEASE Bilateral    KNEE ARTHROSCOPY Right     Family Psychiatric History:   Family History:  Family History  Problem Relation Age of Onset   [name] Mother    Diabetes Mother    Arthritis Mother    Hypertension Mother    Hyperlipidemia Mother    Hypertension Father    Arthritis Father  Hearing loss Father    Kidney disease Father    Lymphoma Father        large b cell    Arthritis Sister    Hypertension Sister    Cancer Brother        ? lung due to exposure    COPD Brother    Early death Brother    Post-traumatic stress disorder Brother    Breast cancer Maternal Aunt    Breast cancer Paternal Aunt    Sleep apnea Daughter    COPD Son    Depression Son    Post-traumatic stress disorder Son    Allergic rhinitis Neg Hx    Angioedema Neg Hx    Eczema Neg Hx    Urticaria Neg Hx    Asthma Neg Hx      Social History:  Social History   Socioeconomic History   Marital status: Widowed    Spouse name: Not on file   Number of children: 2   Years of education: Not on file   Highest education level: Some college, no degree  Occupational History   Not on file  Tobacco Use   Smoking status: Never   Smokeless tobacco: Never  Vaping Use   Vaping status: Never Used  Substance and Sexual Activity   Alcohol use: Yes    Alcohol/week: 2.0 standard drinks of alcohol    Types: 2 Glasses of wine per week    Comment: occ   Drug use: No   Sexual activity: Yes    Birth control/protection: None  Other Topics Concern   Not on file  Social History Narrative   Bus driver    And school custodian    1 daughter lives in [place] 44 y.o as of 01/17/20   1 son 24 almost 16 in [place]   F[employer]    Social Drivers of Health   Financial Resource Strain: Medium Risk (03/14/2024)   Overall Financial Resource Strain (CARDIA)    Difficulty of Paying Living Expenses: Somewhat hard  Food Insecurity: Food Insecurity Present (03/14/2024)   Hunger Vital Sign    Worried About Running Out of Food in the Last Year: Often true    Ran Out of Food in the Last Year: Often true  Transportation Needs: No Transportation Needs (03/14/2024)   PRAPARE - Administrator, Civil Service (Medical): No    Lack of Transportation (Non-Medical): No  Physical Activity: Inactive (03/14/2024)   Exercise Vital Sign    Days of Exercise per Week: 0 days    Minutes of Exercise per Session: Not on file  Stress: Stress Concern Present (03/14/2024)   [employer] of Occupational Health - Occupational Stress Questionnaire    Feeling of Stress: Very much  Social Connections: Moderately Integrated (03/14/2024)   Social Connection and Isolation Panel    Frequency of Communication with Friends and Family: Three times a week    Frequency of Social Gatherings with Friends and Family: Twice a week     Attends Religious Services: 1 to 4 times per year    Active Member of [employer] or Organizations: Yes    Attends Banker Meetings: Never    Marital Status: Widowed    Allergies:  Allergies  Allergen Reactions   Other Hives    White and red sauces   Pineapple     Swelling    Shellfish Allergy  Hives   Strawberry (Diagnostic) Hives   Tomato Hives  cherry    Metabolic Disorder Labs: Lab Results  Component Value Date   HGBA1C 6.1 11/02/2023   No results found for: PROLACTIN Lab Results  Component Value Date   CHOL 246 (H) 04/08/2023   TRIG 113.0 04/08/2023   HDL 64.60 04/08/2023   CHOLHDL 4 04/08/2023   VLDL 22.6 04/08/2023   LDLCALC 159 (H) 04/08/2023   LDLCALC 108 (H) 08/29/2021   Lab Results  Component Value Date   TSH 1.43 04/08/2023   TSH 2.77 01/24/2021    Therapeutic Level Labs: No results found for: LITHIUM No results found for: VALPROATE No results found for: CBMZ  Current Medications: Current Outpatient Medications  Medication Sig Dispense Refill   albuterol  (VENTOLIN  HFA) 108 (90 Base) MCG/ACT inhaler Inhale 2 puffs into the lungs every 6 (six) hours as needed for wheezing or shortness of breath. 8 g 11   amLODipine  (NORVASC ) 5 MG tablet Take 1 tablet (5 mg total) by mouth daily. 90 tablet 0   atorvastatin  (LIPITOR) 10 MG tablet MWF at night 90 tablet 3   buprenorphine  (BUTRANS ) 15 MCG/HR Place 1 patch onto the skin once a week. 4 patch 2   busPIRone  (BUSPAR ) 10 MG tablet Take 2 tablets (20 mg total) by mouth 2 (two) times daily. 120 tablet 1   butalbital -acetaminophen -caffeine  (FIORICET) 50-325-40 MG tablet Take 1 tablet by mouth every 6 (six) hours as needed for headache. 30 tablet 0   cetirizine  (ZYRTEC  ALLERGY ) 10 MG tablet Take 1 tablet (10 mg total) by mouth at bedtime as needed. 90 tablet 3   Cholecalciferol (VITAMIN D -3) 125 MCG (5000 UT) TABS Take by mouth daily.     Cyanocobalamin  (B-12 PO) Take by mouth.     dexamethasone   (DECADRON ) 1 MG tablet Take 1 mg by mouth once.     dicyclomine  (BENTYL ) 20 MG tablet TAKE 1 TABLET BY MOUTH FOUR TIMES DAILY (BEFORE MEALS AND AT BEDTIME) 120 tablet 5   DULoxetine  (CYMBALTA ) 60 MG capsule Take 1 capsule (60 mg total) by mouth 2 (two) times daily. 60 capsule 2   EPINEPHrine  (AUVI-Q ) 0.3 mg/0.3 mL IJ SOAJ injection Use as directed for severe allergic reaction 2 Device 2   fluconazole  (DIFLUCAN ) 150 MG tablet Take 1 tablet by mouth x 1 dose. May repeat in 72 hours if needed. (Patient not taking: Reported on 03/08/2024) 2 tablet 0   fluticasone  (FLONASE ) 50 MCG/ACT nasal spray Place 2 sprays into both nostrils daily. 16 g 6   hydrocortisone  2.5 % cream Apply topically 2 (two) times daily. Prn to eyelids 30 g 0   hydrOXYzine  (VISTARIL ) 25 MG capsule Take 1 capsule (25 mg total) by mouth 3 (three) times daily. 90 capsule 2   ibuprofen (ADVIL) 800 MG tablet Take 800 mg by mouth 3 (three) times daily.     losartan  (COZAAR ) 100 MG tablet Take 1 tablet (100 mg total) by mouth daily. 90 tablet 3   Melatonin 10 MG CAPS Take 10 mg by mouth at bedtime. 30 capsule 6   meloxicam  (MOBIC ) 15 MG tablet Take 1 tablet (15 mg total) by mouth every other day as needed. (Patient not taking: Reported on 03/08/2024) 45 tablet 11   methocarbamol  (ROBAXIN ) 750 MG tablet Take 1 tablet (750 mg total) by mouth every 8 (eight) hours as needed for muscle spasms. 90 tablet 11   montelukast  (SINGULAIR ) 10 MG tablet Take 1 tablet (10 mg total) by mouth at bedtime. 90 tablet 3   pantoprazole  (PROTONIX ) 40 MG tablet  TAKE ONE TABLET EVERY DAY 30 MIN BEFORE DINNER 90 tablet 3   polyethylene glycol powder (GLYCOLAX /MIRALAX ) 17 GM/SCOOP powder Take 17 g by mouth 2 (two) times daily as needed. 3350 g 1   potassium chloride  SA (KLOR-CON  M) 20 MEQ tablet Take 1 tablet (20 mEq total) by mouth daily as needed. With lasix  30 tablet 11   pregabalin  (LYRICA ) 100 MG capsule Take 1 capsule (100 mg total) by mouth 3 (three) times daily.  90 capsule 0   saccharomyces boulardii (FLORASTOR) 250 MG capsule Take 1 capsule (250 mg total) by mouth daily. 90 capsule 0   tirzepatide  (ZEPBOUND ) 2.5 MG/0.5ML Pen Inject 2.5 mg into the skin once a week. (Patient not taking: Reported on 03/08/2024) 2 mL 1   traZODone  (DESYREL ) 50 MG tablet Take 1-2 tablets (50-100 mg total) by mouth at bedtime as needed for sleep. 60 tablet 1   triamcinolone  cream (KENALOG ) 0.1 % Apply 1 Application topically 2 (two) times daily. 30 g 0   No current facility-administered medications for this visit.     Musculoskeletal: Virtual assessment  Psychiatric Specialty Exam: Review of Systems  There were no vitals taken for this visit.There is no height or weight on file to calculate BMI.  General Appearance: Casual  Eye Contact:  Good  Speech:  Clear and Coherent  Volume:  Normal  Mood:  Anxious and Depressed  Affect:  Congruent  Thought Process:  Coherent  Orientation:  Full (Time, Place, and Person)  Thought Content: Logical   Suicidal Thoughts:  No  Homicidal Thoughts:  No  Memory:  Immediate;   Good Recent;   Fair  Judgement:  Good  Insight:  Fair  Psychomotor Activity:  Normal  Concentration:  Concentration: Good  Recall:  Good  Fund of Knowledge: Good  Language: Good  Akathisia:  No  Handed:  Right  AIMS (if indicated): not done  Assets:  Communication Skills Desire for Improvement Social Support Others:  pain management   ADL's:  Intact  Cognition: WNL  Sleep:  Poor due to history related to pain   Screenings: GAD-7    Flowsheet Row Office Visit from 01/18/2024 in [facility] Office Visit from 01/05/2024 in [facility] [employer] Visit from 11/24/2023 in [facility] [employer] Visit from 11/02/2023 in [facility] [employer] Visit from 09/30/2023 in [facility] at  [employer]  Total GAD-7 Score 19 19 21 21 20    [employer]    Flowsheet Row Counselor from 03/08/2024 in [facility] Counselor from 03/01/2024 in [facility] Procedure visit from 02/01/2024 in [facility] Visit from 01/18/2024 in [facility] Office Visit from 01/12/2024 in [facility] Total Score 4 4 4 6 6   PHQ-9 Total Score 17 17 -- 17 --   Flowsheet Row Counselor from 03/08/2024 in [facility] Counselor from 03/01/2024 in [facility] Video Visit from 02/25/2024 in [facility]  C-SSRS RISK CATEGORY No Risk No Risk No Risk     Assessment and Plan:  Continue partial hospitalization programming [facility] Continue medications as directed  Collaboration of Care: Collaboration of Care: Psychiatrist [facility] urgent care provider  Patient/Guardian was advised Release of Information  must be obtained prior to any record release in order to collaborate their care with an outside provider. Patient/Guardian was advised if they have not already done so to contact the registration department to sign all necessary forms in order for us  to release information regarding their care.   Consent: Patient/Guardian gives verbal consent for treatment and assignment of benefits for services provided during this visit. Patient/Guardian expressed understanding and agreed to proceed.    Anita LOISE Kerns, NP 03/15/2024, 9:05 AM

## 2024-03-16 ENCOUNTER — Ambulatory Visit (HOSPITAL_COMMUNITY): Admitting: Licensed Clinical Social Worker

## 2024-03-16 ENCOUNTER — Ambulatory Visit: Admitting: Nurse Practitioner

## 2024-03-16 ENCOUNTER — Ambulatory Visit: Admitting: Orthopedic Surgery

## 2024-03-16 ENCOUNTER — Encounter: Payer: Self-pay | Admitting: Orthopedic Surgery

## 2024-03-16 ENCOUNTER — Ambulatory Visit: Payer: Self-pay | Admitting: Nurse Practitioner

## 2024-03-16 VITALS — BP 130/76 | HR 59 | Temp 98.5°F | Ht 63.0 in | Wt 246.4 lb

## 2024-03-16 VITALS — BP 128/74 | Ht 63.0 in | Wt 247.0 lb

## 2024-03-16 DIAGNOSIS — R7303 Prediabetes: Secondary | ICD-10-CM | POA: Diagnosis not present

## 2024-03-16 DIAGNOSIS — M5033 Other cervical disc degeneration, cervicothoracic region: Secondary | ICD-10-CM | POA: Diagnosis not present

## 2024-03-16 DIAGNOSIS — M5031 Other cervical disc degeneration,  high cervical region: Secondary | ICD-10-CM

## 2024-03-16 DIAGNOSIS — I10 Essential (primary) hypertension: Secondary | ICD-10-CM

## 2024-03-16 DIAGNOSIS — M5412 Radiculopathy, cervical region: Secondary | ICD-10-CM

## 2024-03-16 DIAGNOSIS — Z981 Arthrodesis status: Secondary | ICD-10-CM

## 2024-03-16 DIAGNOSIS — M47812 Spondylosis without myelopathy or radiculopathy, cervical region: Secondary | ICD-10-CM

## 2024-03-16 DIAGNOSIS — M4722 Other spondylosis with radiculopathy, cervical region: Secondary | ICD-10-CM

## 2024-03-16 DIAGNOSIS — M4802 Spinal stenosis, cervical region: Secondary | ICD-10-CM | POA: Diagnosis not present

## 2024-03-16 DIAGNOSIS — M47819 Spondylosis without myelopathy or radiculopathy, site unspecified: Secondary | ICD-10-CM | POA: Diagnosis not present

## 2024-03-16 DIAGNOSIS — F411 Generalized anxiety disorder: Secondary | ICD-10-CM | POA: Diagnosis not present

## 2024-03-16 DIAGNOSIS — R600 Localized edema: Secondary | ICD-10-CM

## 2024-03-16 DIAGNOSIS — F332 Major depressive disorder, recurrent severe without psychotic features: Secondary | ICD-10-CM

## 2024-03-16 LAB — BASIC METABOLIC PANEL WITH GFR
BUN: 16 mg/dL (ref 6–23)
CO2: 27 meq/L (ref 19–32)
Calcium: 9.3 mg/dL (ref 8.4–10.5)
Chloride: 104 meq/L (ref 96–112)
Creatinine, Ser: 0.95 mg/dL (ref 0.40–1.20)
GFR: 69.78 mL/min (ref >60.00)
Glucose, Bld: 92 mg/dL (ref 70–99)
Potassium: 3.7 meq/L (ref 3.5–5.1)
Sodium: 138 meq/L (ref 135–145)

## 2024-03-16 LAB — HEMOGLOBIN A1C: Hgb A1c MFr Bld: 5.9 % (ref 4.6–6.5)

## 2024-03-16 NOTE — Patient Instructions (Signed)
 It was so nice to see you today. Thank you so much for coming in.    You have some wear and tear above your fusion at C3-C4 and at top level of fusion at C4-C5. This may be causing your neck and left arm pain.   The nerve test was normal. That is good news.   I want you to follow up with pain management to discuss possible cervical injections. I sent a message to Dr. Charolotte nurse Nathanel about your follow up in August.   I will see you back for a virtual visit in September. Please do not hesitate to call if you have any questions or concerns. You can also message me in MyChart.   Glade Boys PA-C [phone]     The physicians and staff at [facility] at [facility] are committed to providing excellent care. You may receive a survey asking for feedback about your experience at our office. We value you your feedback and appreciate you taking the time to to fill it out. The [facility] leadership team is also available to discuss your experience in person, feel free to contact us  [phone].

## 2024-03-16 NOTE — Psych (Signed)
 Virtual Visit via Video Note  I connected with Anita Gibbs on 03/16/24 at  9:00 AM EDT by a video enabled telemedicine application and verified that I am speaking with the correct person using two identifiers.  Location: Patient: pt's home in [place], [place] Provider: clinical home office in [place], [place]   I discussed the limitations of evaluation and management by telemedicine and the availability of in person appointments. The patient expressed understanding and agreed to proceed.   I discussed the assessment and treatment plan with the patient. The patient was provided an opportunity to ask questions and all were answered. The patient agreed with the plan and demonstrated an understanding of the instructions.   The patient was advised to call back or seek an in-person evaluation if the symptoms worsen or if the condition fails to improve as anticipated.  I provided 240 minutes of non-face-to-face time during this encounter.   Will LILLETTE Pollack, LCSW   [facility] PHP THERAPIST PROGRESS NOTE  Anita Gibbs [id number]   Session Time: 9:00 am - 10:00 am  Participation Level: Active  Behavioral Response: CasualAlertEuthymic  Type of Therapy: Group Therapy  Treatment Goals addressed: Coping  Progress Towards Goals: Progressing  Interventions: CBT, DBT, Solution Focused, Strength-based, Supportive, and Reframing  Therapist Response: Clinician led check-in regarding current stressors and situation, and review of patient completed daily inventory. Clinician utilized active listening and empathetic response and validated patient emotions. Clinician facilitated processing group on pertinent issues.?   Summary: Patient arrived within time allowed. Patient rates her mood at a 7 on a scale of 1-10 with 10 being best. Pt reported, "I'm fine. I'm just in pain. My mood is fine. I just started water aerobics." She states the pain kept her awake last night and she had an early MD appt..  She states she only ate 1 meal yesterday, which is normal for her.  Pt denied experiencing SI/SH thoughts since last session.  Pt able to process.?Pt engaged in discussion.?      Session Time: 10:00 am - 11:00 am  Participation Level: Active  Behavioral Response: CasualAlertEuthymic  Type of Therapy: Group Therapy  Treatment Goals addressed: Coping  Progress Towards Goals: Progressing  Interventions: CBT, DBT, Solution Focused, Strength-based, Supportive, and Reframing  Therapist Response: Clinician led processing group for pt's current struggles. Group members shared stressors and provided support and feedback. Clinician brought in topics of having empathy for yourself and others, utilizing CBT principles to inform discussion.  Summary: Pt able to process and provide support to group.     Session Time: 11:00 am - 12:00 pm  Participation Level: Active  Behavioral Response: CasualAlertEuthymic  Type of Therapy: Group Therapy  Treatment Goals addressed: Coping  Progress Towards Goals: Progressing  Interventions: CBT, DBT, Solution Focused, Strength-based, Supportive, and Reframing  Therapist Response: Group was led by chaplain, Lamarr Sofia.  Summary: Pt engaged in discussion.    Session Time: 12:00 pm - 1:00 pm  Participation Level: Active  Behavioral Response: CasualAlertEuthymic  Type of Therapy: Group Therapy  Treatment Goals addressed: Coping  Progress Towards Goals: Progressing  Interventions: CBT, DBT, Solution Focused, Strength-based, Supportive, and Reframing  Therapist Response: 12:00 - 12:50 pm: Group was led by occupational therapist, Dallas Purpura. 12:50 - 1:00 pm: Clinician led check-out. Clinician assessed for immediate needs, medication compliance and efficacy, and safety concerns?  Summary: 12:00 - 12:50 pm: Pt engaged and participated in discussion. 12:50 - 1:00 pm: At check-out, patient contracts for safety.?Patient demonstrates progress  as evidenced by her continued engagement and by being receptive to treatment. Patient denies SI/HI/self-harm thoughts at the end of group and agrees to seek help should those thoughts/feelings occur.?   Suicidal/Homicidal: Nowithout intent/plan  Plan: ?Pt will continue in PHP and medication management while continuing to work on decreasing depression symptoms,?SI, and anxiety symptoms,?and increasing the ability to self manage symptoms.    Collaboration of Care: Other none required for this visit  Patient/Guardian was advised Release of Information must be obtained prior to any record release in order to collaborate their care with an outside provider. Patient/Guardian was advised if they have not already done so to contact the registration department to sign all necessary forms in order for us  to release information regarding their care.   Consent: Patient/Guardian gives verbal consent for treatment and assignment of benefits for services provided during this visit. Patient/Guardian expressed understanding and agreed to proceed.   Diagnosis: GAD (generalized anxiety disorder) [F41.1]    1. GAD (generalized anxiety disorder)   2. Severe episode of recurrent major depressive disorder, without psychotic features [facility])       Will LILLETTE Pollack, LCSW 03/16/2024

## 2024-03-16 NOTE — Progress Notes (Signed)
 Leron Glance, NP-C Phone: [phone]  Anita Gibbs is a 53 y.o. female who presents today for follow up.   Discussed the use of AI scribe software for clinical note transcription with the patient, who gave verbal consent to proceed.  History of Present Illness   LIVVY Gibbs is a 53 year old female who presents for follow-up and evaluation for multiple sclerosis.  She is experiencing ongoing symptoms that prompted the referral for multiple sclerosis evaluation. A previous EMG of her upper extremities was normal. She is seeking further evaluation due to persistent symptoms.  She is concerned about her diabetes management, with her last HbA1c recorded at 6.1 in March, indicating prediabetes. She experiences increased thirst and fatigue but does not regularly monitor her blood sugar. She takes hydrochlorothiazide  and potassium daily for leg swelling, which has been more frequent recently.  She has been started on Zepbound  by pulmonology to aid in weight loss and manage her sleep apnea. She has completed one dose without experiencing nausea. She engages in water aerobics for exercise, although she experiences soreness afterward.  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 She is also seeing a counselor, which she finds beneficial, although she still has challenging days. Her current medications include hydroxyzine , Buspar , and Cymbalta .  She reports ongoing shortness of breath and lightheadedness, which she attributes to pain. She has experienced frustration with pain management services and is seeking further solutions for her pain.      Social History   Tobacco Use  Smoking Status Never  Smokeless Tobacco Never    Current Outpatient Medications on File Prior to Visit  Medication Sig Dispense Refill   albuterol  (VENTOLIN  HFA) 108 (90 Base) MCG/ACT inhaler Inhale 2 puffs into the lungs every 6 (six) hours as  needed for wheezing or shortness of breath. 8 g 11   amLODipine  (NORVASC ) 5 MG tablet Take 1 tablet (5 mg total) by mouth daily. 90 tablet 0   atorvastatin  (LIPITOR) 10 MG tablet MWF at night 90 tablet 3   busPIRone  (BUSPAR ) 10 MG tablet Take 2 tablets (20 mg total) by mouth 2 (two) times daily. 120 tablet 1   butalbital -acetaminophen -caffeine  (FIORICET) 50-325-40 MG tablet Take 1 tablet by mouth every 6 (six) hours as needed for headache. 30 tablet 0   cetirizine  (ZYRTEC  ALLERGY ) 10 MG tablet Take 1 tablet (10 mg total) by mouth at bedtime as needed. 90 tablet 3   Cholecalciferol (VITAMIN D -3) 125 MCG (5000 UT) TABS Take by mouth daily.     Cyanocobalamin  (B-12 PO) Take by mouth.     dicyclomine  (BENTYL ) 20 MG tablet TAKE 1 TABLET BY MOUTH FOUR TIMES DAILY (BEFORE MEALS AND AT BEDTIME) 120 tablet 5   DULoxetine  (CYMBALTA ) 60 MG capsule Take 1 capsule (60 mg total) by mouth 2 (two) times daily. 60 capsule 2   EPINEPHrine  (AUVI-Q ) 0.3 mg/0.3 mL IJ SOAJ injection Use as directed for severe allergic reaction 2 Device 2   fluticasone  (FLONASE ) 50 MCG/ACT nasal spray Place 2 sprays into both nostrils daily. 16 g 6   hydrocortisone  2.5 % cream Apply topically 2 (two) times daily. Prn to eyelids 30 g 0   hydrOXYzine  (VISTARIL ) 25 MG capsule Take 1 capsule (25 mg total) by mouth 3 (three) times daily. 90 capsule 2   ibuprofen (ADVIL) 800 MG tablet Take 800 mg by mouth 3 (three) times daily.     losartan  (COZAAR ) 100 MG tablet Take 1  tablet (100 mg total) by mouth daily. 90 tablet 3   Melatonin 10 MG CAPS Take 10 mg by mouth at bedtime. 30 capsule 6   methocarbamol  (ROBAXIN ) 750 MG tablet Take 1 tablet (750 mg total) by mouth every 8 (eight) hours as needed for muscle spasms. 90 tablet 11   montelukast  (SINGULAIR ) 10 MG tablet Take 1 tablet (10 mg total) by mouth at bedtime. 90 tablet 3   pantoprazole  (PROTONIX ) 40 MG tablet TAKE ONE TABLET EVERY DAY 30 MIN BEFORE DINNER 90 tablet 3   polyethylene glycol  powder (GLYCOLAX /MIRALAX ) 17 GM/SCOOP powder Take 17 g by mouth 2 (two) times daily as needed. 3350 g 1   potassium chloride  SA (KLOR-CON  M) 20 MEQ tablet Take 1 tablet (20 mEq total) by mouth daily as needed. With lasix  30 tablet 11   pregabalin  (LYRICA ) 100 MG capsule Take 1 capsule (100 mg total) by mouth 3 (three) times daily. 90 capsule 0   saccharomyces boulardii (FLORASTOR) 250 MG capsule Take 1 capsule (250 mg total) by mouth daily. 90 capsule 0   tirzepatide  (ZEPBOUND ) 2.5 MG/0.5ML Pen Inject 2.5 mg into the skin once a week. 2 mL 1   traZODone  (DESYREL ) 50 MG tablet Take 1-2 tablets (50-100 mg total) by mouth at bedtime as needed for sleep. 60 tablet 1   triamcinolone  cream (KENALOG ) 0.1 % Apply 1 Application topically 2 (two) times daily. 30 g 0   buprenorphine  (BUTRANS ) 15 MCG/HR Place 1 patch onto the skin once a week. 4 patch 2   No current facility-administered medications on file prior to visit.     ROS see history of present illness  Objective  Physical Exam Vitals:   03/16/24 0811  BP: 130/76  Pulse: (!) 59  Temp: 98.5 F (36.9 C)  SpO2: 99%    BP Readings from Last 3 Encounters:  03/22/24 (!) 142/73  03/16/24 128/74  03/16/24 130/76   Wt Readings from Last 3 Encounters:  03/22/24 242 lb (109.8 kg)  03/16/24 247 lb (112 kg)  03/16/24 246 lb 6.4 oz (111.8 kg)    Physical Exam Constitutional:      General: She is not in acute distress.    Appearance: Normal appearance.  HENT:     Head: Normocephalic.  Cardiovascular:     Rate and Rhythm: Normal rate and regular rhythm.     Heart sounds: Normal heart sounds.  Pulmonary:     Effort: Pulmonary effort is normal.     Breath sounds: Normal breath sounds.  Skin:    General: Skin is warm and dry.  Neurological:     General: No focal deficit present.     Mental Status: She is alert.  Psychiatric:        Mood and Affect: Mood normal.        Behavior: Behavior normal.      Assessment/Plan: Please see  individual problem list.  Prediabetes Assessment & Plan: Her last HbA1c was 6.1 in March, indicating prediabetes. She reports increased thirst and fatigue and does not monitor blood glucose at home. She is on Zepbound  for weight loss and diabetes management, which may be titrated based on HbA1c. Risks include pancreatitis, gastroparesis, nausea, abdominal pain, and acid reflux. Order an HbA1c test, continue Zepbound  with possible titration based on results, and encourage a healthy diet and exercise, including water aerobics. Monitor for Zepbound  side effects such as nausea, abdominal pain, and acid reflux.  Orders: -     Hemoglobin A1c  Essential  hypertension Assessment & Plan: Her blood pressure is well-controlled with amlodipine , hydrochlorothiazide , and losartan . She experiences no chest pain, but shortness of breath and lightheadedness may be related to pain. Continue current antihypertensive medications.  Orders: -     Basic metabolic panel with GFR  Bilateral leg edema Assessment & Plan: Chronic issue. She reports bilateral leg swelling and continues taking hydrochlorothiazide  and potassium despite previous as-needed instructions due to kidney concerns. Hydrochlorothiazide  lowers potassium, necessitating supplementation. Check kidney function, continue potassium supplementation, and advise on adequate hydration. Advise daily compression stockings and elevating legs.    Seronegative spondyloarthropathy Assessment & Plan: She is undergoing infusions with no significant pain improvement. Follow up with physical med and rheumatology as scheduled.    Cervical radicular pain Assessment & Plan: She was referred to Dr. Maree for an MS evaluation following a neurosurgeon referral. A previous EMG of the upper extremities was normal. Follow up with Dr. Maree for further evaluation.       Return in about 3 months (around 06/16/2024) for Follow up.   Leron Glance, NP-C [facility]

## 2024-03-17 ENCOUNTER — Ambulatory Visit (HOSPITAL_COMMUNITY): Admitting: Licensed Clinical Social Worker

## 2024-03-17 DIAGNOSIS — F411 Generalized anxiety disorder: Secondary | ICD-10-CM

## 2024-03-17 DIAGNOSIS — F332 Major depressive disorder, recurrent severe without psychotic features: Secondary | ICD-10-CM

## 2024-03-18 ENCOUNTER — Ambulatory Visit (HOSPITAL_COMMUNITY): Admitting: Licensed Clinical Social Worker

## 2024-03-18 DIAGNOSIS — F411 Generalized anxiety disorder: Secondary | ICD-10-CM

## 2024-03-18 DIAGNOSIS — F332 Major depressive disorder, recurrent severe without psychotic features: Secondary | ICD-10-CM

## 2024-03-18 NOTE — Progress Notes (Signed)
 Spoke with patient via Teams video call, used 2 identifiers to correctly identify patient. States that groups are helping a lot. She would like to have Genetic testing done and is able to do it at her home. Message sent to Psychiatrist. She goes Tuesday to see a pain doctor about getting injections in her neck. Also having sharp pains in her eyes that lasts a few seconds and her vision gets blurry. Her eye exam was normal a couple months ago. She will discuss this with her MD. Denies SI/HI or AVH. On scale 1-10 as 10 being worst she rates depression at 5 and anxiety at 7. No issues or complaints.

## 2024-03-20 NOTE — Psych (Signed)
 Virtual Visit via Video Note  I connected with Anita Gibbs on 03/09/24 at  9:00 AM EDT by a video enabled telemedicine application and verified that I am speaking with the correct person using two identifiers.  Location: Patient: Patient home Provider: clinical home office   I discussed the limitations of evaluation and management by telemedicine and the availability of in person appointments. The patient expressed understanding and agreed to proceed.  I discussed the assessment and treatment plan with the patient. The patient was provided an opportunity to ask questions and all were answered. The patient agreed with the plan and demonstrated an understanding of the instructions.   The patient was advised to call back or seek an in-person evaluation if the symptoms worsen or if the condition fails to improve as anticipated.  Pt was provided 240 minutes of non-face-to-face time during this encounter.   Randall Bastos, LCSW   [facility] PHP THERAPIST PROGRESS NOTE  Anita Gibbs [id number]  Session Time: 9:00 - 10:00  Participation Level: Active  Behavioral Response: CasualAlertDepressed  Type of Therapy: Group Therapy  Treatment Goals addressed: Coping  Progress Towards Goals: Progressing  Interventions: CBT, DBT, Supportive, and Reframing  Summary: Anita Gibbs is a 53 y.o. female who presents with depression and anxiety symptoms.  Clinician led check-in regarding current stressors and situation, and review of patient completed daily inventory. Clinician utilized active listening and empathetic response and validated patient emotions. Clinician facilitated processing group on pertinent issues.   Therapist Response: Patient arrived within time allowed. Patient rates her mood at a 3 on a scale of 1-10 with 10 being best. Pt states she feels irritated. Pt states she slept 3 hours and ate 1x. Pt reports continued frustration with her pain doctor and feeling put off. Pt  states experiencing a panic attack while on a phone call with the PA. Pt states her friend helped de-escalate her. Pt reports continued poor sleep.  Pt able to process. Pt engaged in discussion.          Session Time: 10:00 am - 11:00 am   Participation Level: Active   Behavioral Response: CasualAlertDepressed   Type of Therapy: Group Therapy   Treatment Goals addressed: Coping   Progress Towards Goals: Progressing   Interventions: CBT, DBT, Solution Focused, Strength-based, Supportive, and Reframing   Summary:  Cln led processing group for pt's current struggles. Group members shared stressors and provided support and feedback. Cln brought in topics of boundaries, healthy relationships, and unhealthy thought processes to inform discussion.    Therapist Response: Pt able to process and provide support to group.         Session Time: 11:00 -12:00   Participation Level: Active   Behavioral Response: CasualAlertDepressed   Type of Therapy: Group Therapy   Treatment Goals addressed: Coping   Progress Towards Goals: Progressing   Interventions: Strength-based, Supportive, and Reframing   Summary: Chaplaincy group with K. Claussen   Therapist Response: Pt participated and engaged in discussion.           Session Time: 12:00 -1:00   Participation Level: Active   Behavioral Response: CasualAlertDepressed   Type of Therapy: Group therapy   Treatment Goals addressed: Coping   Progress Towards Goals: Progressing   Interventions: OT group   Summary: 12:00 - 12:50: Occupational Therapy group with cln E. Hollan.  12:50 - 1:00 Clinician assessed for immediate needs, medication compliance and efficacy, and safety concerns.   Therapist Response: 12:00 - 12:50: Pt  participated 12:50 - 1:00 pm: At check-out, patient reports no immediate concerns. Patient demonstrates progress as evidenced by continued engagement and responsiveness to treatment. Patient denies  SI/HI/self-harm thoughts at the end of group.    Suicidal/Homicidal: Nowithout intent/plan  Plan: Pt will continue in PHP while working to decrease depression and anxiety symptoms, increase daily functioning, and increase ability to manage symptoms in a healthy manner.   Collaboration of Care: Medication Management AEB T Lewis, NP  Patient/Guardian was advised Release of Information must be obtained prior to any record release in order to collaborate their care with an outside provider. Patient/Guardian was advised if they have not already done so to contact the registration department to sign all necessary forms in order for us  to release information regarding their care.   Consent: Patient/Guardian gives verbal consent for treatment and assignment of benefits for services provided during this visit. Patient/Guardian expressed understanding and agreed to proceed.   Diagnosis: Severe recurrent major depression without psychotic features (HCC) [F33.2]    1. Severe recurrent major depression without psychotic features (HCC)   2. GAD (generalized anxiety disorder)       Randall Bastos, LCSW

## 2024-03-21 ENCOUNTER — Ambulatory Visit (HOSPITAL_COMMUNITY): Admitting: Licensed Clinical Social Worker

## 2024-03-21 ENCOUNTER — Ambulatory Visit: Admitting: Student in an Organized Health Care Education/Training Program

## 2024-03-21 DIAGNOSIS — F332 Major depressive disorder, recurrent severe without psychotic features: Secondary | ICD-10-CM | POA: Diagnosis not present

## 2024-03-21 DIAGNOSIS — F411 Generalized anxiety disorder: Secondary | ICD-10-CM

## 2024-03-21 DIAGNOSIS — M79601 Pain in right arm: Secondary | ICD-10-CM | POA: Diagnosis not present

## 2024-03-21 DIAGNOSIS — M79602 Pain in left arm: Secondary | ICD-10-CM | POA: Diagnosis not present

## 2024-03-21 DIAGNOSIS — M3505 Sjogren syndrome with inflammatory arthritis: Secondary | ICD-10-CM | POA: Diagnosis not present

## 2024-03-21 DIAGNOSIS — Z4789 Encounter for other orthopedic aftercare: Secondary | ICD-10-CM | POA: Diagnosis not present

## 2024-03-21 NOTE — Psych (Signed)
 Virtual Visit via Video Note  I connected with Anita Gibbs on 03/10/24 at  9:00 AM EDT by a video enabled telemedicine application and verified that I am speaking with the correct person using two identifiers.  Location: Patient: Patient home Provider: clinical home office   I discussed the limitations of evaluation and management by telemedicine and the availability of in person appointments. The patient expressed understanding and agreed to proceed.  I discussed the assessment and treatment plan with the patient. The patient was provided an opportunity to ask questions and all were answered. The patient agreed with the plan and demonstrated an understanding of the instructions.   The patient was advised to call back or seek an in-person evaluation if the symptoms worsen or if the condition fails to improve as anticipated.  Pt was provided 240 minutes of non-face-to-face time during this encounter.   Randall Bastos, LCSW   [facility] PHP THERAPIST PROGRESS NOTE  JULIA ALKHATIB [id number]  Session Time: 9:00 - 10:00  Participation Level: Active  Behavioral Response: CasualAlertDepressed  Type of Therapy: Group Therapy  Treatment Goals addressed: Coping  Progress Towards Goals: Progressing  Interventions: CBT, DBT, Supportive, and Reframing  Summary: [name] is a 53 y.o. female who presents with depression and anxiety symptoms.  Clinician led check-in regarding current stressors and situation, and review of patient completed daily inventory. Clinician utilized active listening and empathetic response and validated patient emotions. Clinician facilitated processing group on pertinent issues.   Therapist Response: Patient arrived within time allowed. Patient rates her mood at a 5 on a scale of 1-10 with 10 being best. Pt states she feels nervous. Pt states she slept 4 hours and ate 1x. Pt reports she spent yesterday keeping to herself and feeling anxious and unsettled  without understanding why. Pt shares her sister called but pt ignored the call which is progress for her. Pt states continued pain leans her towards hopelessness. Pt able to process. Pt engaged in discussion.          Session Time: 10:00 am - 11:00 am   Participation Level: Active   Behavioral Response: CasualAlertDepressed   Type of Therapy: Group Therapy   Treatment Goals addressed: Coping   Progress Towards Goals: Progressing   Interventions: CBT, DBT, Solution Focused, Strength-based, Supportive, and Reframing   Summary: Cln introduced DBT emotion regulation skill, PLEASE. Cln discussed the importance of taking care of our whole body as a way to aid in stabilizing emotions. Group discussed ways they struggle to manage the elements of PLEASE and how to remove barriers.    Therapist Response: Pt engaged in discussion and is able to identify ways to utilize PLEASE skill.         Session Time: 11:00 -12:00   Participation Level: Active   Behavioral Response: CasualAlertDepressed   Type of Therapy: Group Therapy   Treatment Goals addressed: Coping   Progress Towards Goals: Progressing   Interventions: CBT, DBT, Solution Focused, Strength-based, Supportive, and Reframing   Summary: Cln introduced topic of stress management and the model of the 4 A's of stress management: avoid, alter, accept, and adapt. Group members worked through Engineer, structural and discussed barriers to utilizing the 4 A's for stressors.    Therapist Response:  Pt engaged in discussion and reports understanding of how to utilize the 4 A's.           Session Time: 12:00 -1:00   Participation Level: Active   Behavioral Response: CasualAlertDepressed  Type of Therapy: Group therapy   Treatment Goals addressed: Coping   Progress Towards Goals: Progressing   Interventions: OT group   Summary: 12:00 - 12:50: Occupational Therapy group with cln E. Hollan.  12:50 - 1:00 Clinician assessed for  immediate needs, medication compliance and efficacy, and safety concerns.   Therapist Response: 12:00 - 12:50: Pt participated 12:50 - 1:00 pm: At check-out, patient reports no immediate concerns. Patient demonstrates progress as evidenced by continued engagement and responsiveness to treatment. Patient denies SI/HI/self-harm thoughts at the end of group.    Suicidal/Homicidal: Nowithout intent/plan  Plan: Pt will continue in PHP while working to decrease depression and anxiety symptoms, increase daily functioning, and increase ability to manage symptoms in a healthy manner.   Collaboration of Care: Medication Management AEB T Lewis, NP  Patient/Guardian was advised Release of Information must be obtained prior to any record release in order to collaborate their care with an outside provider. Patient/Guardian was advised if they have not already done so to contact the registration department to sign all necessary forms in order for us  to release information regarding their care.   Consent: Patient/Guardian gives verbal consent for treatment and assignment of benefits for services provided during this visit. Patient/Guardian expressed understanding and agreed to proceed.   Diagnosis: Severe recurrent major depression without psychotic features (HCC) [F33.2]    1. Severe recurrent major depression without psychotic features (HCC)   2. GAD (generalized anxiety disorder)       Randall Bastos, LCSW

## 2024-03-22 ENCOUNTER — Ambulatory Visit
Attending: Student in an Organized Health Care Education/Training Program | Admitting: Student in an Organized Health Care Education/Training Program

## 2024-03-22 ENCOUNTER — Ambulatory Visit (HOSPITAL_COMMUNITY)

## 2024-03-22 ENCOUNTER — Encounter: Payer: Self-pay | Admitting: Student in an Organized Health Care Education/Training Program

## 2024-03-22 ENCOUNTER — Encounter (HOSPITAL_COMMUNITY): Payer: Self-pay

## 2024-03-22 VITALS — BP 142/73 | HR 63 | Temp 98.0°F | Resp 14 | Ht 63.0 in | Wt 242.0 lb

## 2024-03-22 DIAGNOSIS — G894 Chronic pain syndrome: Secondary | ICD-10-CM | POA: Insufficient documentation

## 2024-03-22 DIAGNOSIS — M5481 Occipital neuralgia: Secondary | ICD-10-CM | POA: Diagnosis not present

## 2024-03-22 DIAGNOSIS — M5412 Radiculopathy, cervical region: Secondary | ICD-10-CM | POA: Diagnosis not present

## 2024-03-22 DIAGNOSIS — M47819 Spondylosis without myelopathy or radiculopathy, site unspecified: Secondary | ICD-10-CM | POA: Diagnosis not present

## 2024-03-22 NOTE — Progress Notes (Signed)
 PROVIDER NOTE: Interpretation of information contained herein should be left to medically-trained personnel. Specific patient instructions are provided elsewhere under Patient Instructions section of medical record. This document was created in part using AI and STT-dictation technology, any transcriptional errors that may result from this process are unintentional.  Patient: Anita Gibbs  Service: E/M   PCP: Gretel App, NP  DOB: [birth year]  DOS: 03/22/2024  Provider: Wallie Sherry, MD  MRN: [id number]  Delivery: Face-to-face  Specialty: Interventional Pain Management  Type: Established Patient  Setting: Ambulatory outpatient facility  Specialty designation: 09  Referring Prov.: Gretel App, NP  Location: Outpatient office facility       History of present illness (HPI) Anita Gibbs, a 53 y.o. year old female, is here today because of her Cervical radicular pain [M54.12]. [name] primary complain today is Back Pain, Hip Pain, Leg Pain, and Neck Pain  Pertinent problems: Anita Gibbs has Morbid obesity with BMI of 45.0-49.9, adult [facility]) on their pertinent problem list.  Pain Assessment: Severity of Chronic pain is reported as a 9 /10. Location: Neck Posterior/Radaites from base of neck into left shoulder down left arm and causing cramping/tingling into fingers of left hand. Onset: More than a month ago. Quality: Aching, Burning, Constant, Throbbing, Stabbing, Shooting, Sharp, Discomfort. Timing: Constant. Modifying factor(s): Denies. Vitals:  height is 5' 3 (1.6 m) and weight is 242 lb (109.8 kg). Her temporal temperature is 98 F (36.7 C). Her blood pressure is 142/73 (abnormal) and her pulse is 63. Her respiration is 14 and oxygen saturation is 100%.  BMI: Estimated body mass index is 42.87 kg/m as calculated from the following:   Height as of this encounter: 5' 3 (1.6 m).   Weight as of this encounter: 242 lb (109.8 kg).  Last encounter: 09/22/2023. Last procedure:  02/01/2024.  Reason for encounter:  Patient presents today with increased cervical spine pain which radiates into her left shoulder left arm causing tingling's into the fingers of her left hand.  She also endorses headaches in her posterior occipital region which is worse with cervical lateral rotation and extension.  She has been evaluated by neurosurgery who did not recommend surgery at this time and recommended that she see me for consideration of cervical spinal injections.   UDS:  Summary  Date Value Ref Range Status  02/01/2020 Note  Final    Comment:    ==================================================================== [employer] Select 13 (MW) ==================================================================== Test                             Result       Flag       Units Drug Present not Declared for Prescription Verification   Carboxy-THC                    20           UNEXPECTED ng/mg creat    Carboxy-THC is a metabolite of tetrahydrocannabinol (THC). Source of    THC is most commonly herbal marijuana or marijuana-based products,    but THC is also present in a scheduled prescription medication.    Trace amounts of THC can be present in hemp and cannabidiol (CBD)    products. This test is not intended to distinguish between delta-9-    tetrahydrocannabinol, the predominant form of THC in most herbal or    marijuana-based products, and delta-8-tetrahydrocannabinol. Drug Absent but Declared for Prescription Verification  Oxycodone                       Not Detected UNEXPECTED ng/mg creat ==================================================================== Test                      Result    Flag   Units      Ref Range   Creatinine              230              mg/dL      >=79 ==================================================================== Declared Medications:  The flagging and interpretation on this report are based on the  following declared medications.  Unexpected  results may arise from  inaccuracies in the declared medications.  **Note: The testing scope of this panel includes these medications:  Oxycodone  (Roxicodone )  **Note: The testing scope of this panel does not include the  following reported medications:  Albuterol  (Ventolin  HFA)  Amlodipine  (Norvasc )  Atorvastatin  (Lipitor)  Cetirizine   Diphenhydramine (Benadryl)  Epinephrine  (EpiPen )  Fluticasone  (Flonase )  Gabapentin  (Neurontin )  Hydrochlorothiazide   Losartan  (Cozaar )  Meloxicam  (Mobic )  Methocarbamol  (Robaxin )  Montelukast  (Singulair )  Norethindrone (Aygestin)  Pantoprazole  (Protonix )  Vitamin D3 ==================================================================== For clinical consultation, please call [phone]. ====================================================================     No results found for: CBDTHCR No results found for: D8THCCBX No results found for: D9THCCBX  ROS  Constitutional: Denies any fever or chills Gastrointestinal: No reported hemesis, hematochezia, vomiting, or acute GI distress Musculoskeletal: Left neck pain which radiates into the left arm Neurological: Occipital neuralgia  Medication Review  Cyanocobalamin , DULoxetine , EPINEPHrine , Melatonin, Vitamin D -3, albuterol , amLODipine , atorvastatin , buprenorphine , busPIRone , butalbital -acetaminophen -caffeine , cetirizine , dicyclomine , fluticasone , hydrOXYzine , hydrochlorothiazide , hydrocortisone , ibuprofen, losartan , methocarbamol , montelukast , pantoprazole , polyethylene glycol powder, potassium chloride  SA, pregabalin , saccharomyces boulardii, tirzepatide , traZODone , and triamcinolone  cream  History Review  Allergy : Anita Gibbs is allergic to other, pineapple, pineapple extract, shellfish allergy , strawberry (diagnostic), and tomato. Drug: Anita Gibbs  reports no history of drug use. Alcohol:  reports current alcohol use of about 2.0 standard drinks of alcohol per week. Tobacco:   reports that she has never smoked. She has never used smokeless tobacco. Social: Anita Gibbs  reports that she has never smoked. She has never used smokeless tobacco. She reports current alcohol use of about 2.0 standard drinks of alcohol per week. She reports that she does not use drugs. 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), Adrenal nodule (HCC) (06/20/2020), Allergy , ANA positive (02/01/2021), Anxiety and depression (01/29/2021), Arthritis, Back pain with history of spinal surgery (01/29/2021), Bilateral carpal tunnel syndrome (01/29/2021), Bilateral leg edema (01/29/2021), BMI 40.0-44.9, adult (HCC) (04/30/2022), Carpal boss, right, Carpal tunnel syndrome, bilateral, 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) (09/27/2018), Cervical facet hypertrophy (C3-T1) (09/27/2018), Cervical facet joint syndrome (Bilateral) (L>R) (09/27/2018), 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-5, C5-6, and C6-7) (09/27/2018), Cervical myelopathy (HCC) (07/01/2017), Cervicalgia (Primary Area of Pain) (Bilateral) (L>R) (09/27/2018), Chronic gout of foot (Left) (05/31/2018), Chronic low back pain (Tertiary Area of Pain) (Bilateral) (R>L) w/ sciatica (Bilateral) (09/07/2018), Chronic low back pain (Tertiary Area of Pain) (Bilateral) (R>L) w/o sciatica (07/26/2018), Chronic lower extremity pain (Fourth Area of Pain) (Bilateral) (R>L) (09/07/2018), Chronic musculoskeletal pain (10/27/2018), Chronic neck pain (Bilateral) w/ history of cervical spinal surgery (09/27/2018), Chronic neck pain (Primary Area of Pain) (Bilateral) (L>R) (09/07/2018), Chronic sacroiliac joint  dysfunction (Bilateral) (09/27/2018), Chronic sacroiliac joint pain (Right) (09/07/2018), Chronic upper extremity pain  (Secondary Area of Pain) (Bilateral) (R>L) (09/27/2018), Disorder of skeletal system (09/07/2018), E. coli UTI (urinary tract infection) (06/20/2020), Elevated sed rate (09/08/2018), Elevated serum creatinine (06/20/2020), Excessive daytime sleepiness (03/06/2020), Female pelvic pain (09/05/2020), GERD (gastroesophageal reflux disease), Headache, History of allergy  to shellfish (09/28/2018), History of fusion of cervical spine (ACDF C4-C7) (09/27/2018), History of illicit drug use (03/07/2020), Hives (09/05/2020), Hives (09/05/2020), Hyperkalemia (05/31/2018), Hyperlipidemia (04/30/2022), Hypertension, Large breasts (05/30/2020), Left hip pain (07/21/2022), Lumbar facet arthropathy (04/20/2019), Lumbar radiculitis (L5 dermatome) (Right) (04/17/2020), Multiple environmental allergies (01/17/2020), Multiple food allergies (03/07/2020), Neurogenic pain (10/27/2018), Numbness and tingling of both feet (05/31/2018), 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), Other intervertebral disc degeneration, 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), Pharmacologic therapy (09/07/2018), Plantar fasciitis, bilateral (03/04/2021), Polyneuropathy (01/29/2021), Prediabetes (10/10/2019), Problems influencing health status (09/07/2018), Rash (08/08/2019), Seasonal allergic rhinitis due to pollen (11/29/2018), Seasonal allergies (03/06/2020), Seasonal asthma (11/29/2018), Sjogren's disease (HCC), Sleep apnea, Somatic dysfunction of sacroiliac joint (Bilateral) (09/27/2018), Spondylosis without myelopathy or radiculopathy, cervical region (03/22/2019), Spondylosis without myelopathy or radiculopathy, lumbosacral region (05/19/2019), Spondylosis, cervical, 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 Surgical: Ms. Eggleton  has a past surgical history that includes Knee arthroscopy (Right); Anterior cervical decomp/discectomy fusion (N/A, 07/01/2017); Abdominal hysterectomy (10/17/2020); and Carpal tunnel release  (Bilateral). Family: family history includes [name] in her mother; Arthritis in her father, mother, and sister; Breast cancer in her maternal aunt and paternal aunt; COPD in her brother and son; Cancer in her brother; Depression in her son; Diabetes in her mother; Early death in her brother; Hearing loss in her father; Hyperlipidemia in her mother; Hypertension in her father, mother, and sister; Kidney disease in her father; Lymphoma in her father; Post-traumatic stress disorder in her brother and son; Sleep apnea in her daughter.  Laboratory Chemistry Profile   Renal Lab Results  Component Value Date   BUN 16 03/16/2024   CREATININE 0.95 03/16/2024   BCR NOT APPLICABLE 02/13/2021   GFR 69.78 03/16/2024   GFRAA 76 09/07/2018   GFRNONAA >60 11/18/2023    Hepatic Lab Results  Component Value Date   AST 12 (L) 11/18/2023   ALT 12 11/18/2023   ALBUMIN 3.9 11/18/2023   ALKPHOS 45 11/18/2023   LIPASE 32.0 06/30/2022    Electrolytes Lab Results  Component Value Date   NA 138 03/16/2024   K 3.7 03/16/2024   CL 104 03/16/2024   CALCIUM  9.3 03/16/2024   MG 2.2 09/07/2018    Bone Lab Results  Component Value Date   VD25OH 29.35 (L) 04/08/2023   25OHVITD1 8.1 (L) 09/07/2018   25OHVITD2 <1.0 09/07/2018   25OHVITD3 8.1 09/07/2018    Inflammation (CRP: Acute Phase) (ESR: Chronic Phase) Lab Results  Component Value Date   CRP <1.0 07/22/2023   ESRSEDRATE 57 (H) 07/22/2023         Note: Above Lab results reviewed.  Recent Imaging Review  NCV with EMG(electromyography) Tobie Tonita POUR, DO     03/10/2024 12:37 PM  [facility]  [address] [place], [address]  [place], [place] [address] Tel: [phone] Fax: [phone] Test Date:  03/10/2024  Patient: Vivian Neuwirth DOB: 03-22-1971 Physician: Tonita Tobie, DO   Sex: Female Height: 5' 3 Ref Phys: Glade Boys, DEVONNA  ID#: [id number]   Technician:    History: This is a 53 year old female s/p C4-C7 ACDF (2018)  referred for  evaluation of bilateral arm pain and paresthesias.   NCV &  EMG Findings: Extensive electrodiagnostic testing of the right upper extremity  and additional studies of the left shows:  Bilateral median, ulnar, and mixed palmar sensory responses are  within normal limits. Bilateral median and ulnar motor responses are within normal  limits. There is no evidence of active or chronic motor axonal loss  changes affecting any of the tested muscles.  Motor unit  configuration and recruitment pattern is within normal limits.  Impression: This is a normal study of the upper extremities.  In particular,  there is no evidence of carpal tunnel syndrome or a cervical  radiculopathy.  ___________________________ Tonita Blanch, DO  Nerve Conduction Studies   Stim Site NR Peak (ms) Norm Peak (ms) O-P Amp (V) Norm O-P Amp  Left Median Anti Sensory (2nd Digit)  32 C  Wrist    2.9 <3.6 35.9 >15  Right Median Anti Sensory (2nd Digit)  32 C  Wrist    3.2 <3.6 29.0 >15  Left Ulnar Anti Sensory (5th Digit)  32 C  Wrist    2.8 <3.1 37.2 >10  Right Ulnar Anti Sensory (5th Digit)  32 C  Wrist    2.9 <3.1 39.1 >10    Stim Site NR Onset (ms) Norm Onset (ms) O-P Amp (mV) Norm O-P  Amp Site1 Site2 Delta-0 (ms) Dist (cm) Vel (m/s) Norm Vel (m/s)  Left Median Motor (Abd Poll Brev)  32 C  Wrist    2.9 <4.0 11.7 >6 Elbow Wrist 4.9 28.0 57 >50  Elbow    7.8  11.6         Right Median Motor (Abd Poll Brev)  32 C  Wrist    3.2 <4.0 11.9 >6 Elbow Wrist 4.6 29.0 63 >50  Elbow    7.8  11.0         Left Ulnar Motor (Abd Dig Minimi)  32 C  Wrist    2.7 <3.1 9.3 >7 B Elbow Wrist 3.9 20.0 51 >50  B Elbow    6.6  9.2  A Elbow B Elbow 1.8 10.0 56 >50  A Elbow    8.4  8.9         Right Ulnar Motor (Abd Dig Minimi)  32 C  Wrist    2.6 <3.1 9.1 >7 B Elbow Wrist 4.2 23.0 55 >50  B Elbow    6.8  8.7  A Elbow B Elbow 1.8 10.0 56 >50  A Elbow    8.6  8.2           Stim Site NR Peak (ms) Norm Peak  (ms) P-T Amp (V) Site1 Site2  Delta-P (ms) Norm Delta (ms)  Left Median/Ulnar Palm Comparison (Wrist - 8cm)  32 C  Median Palm    1.5 <2.2 49.6 Median Palm Ulnar Palm 0.2   Ulnar Palm    1.7 <2.2 11.6      Right Median/Ulnar Palm Comparison (Wrist - 8cm)  32 C  Median Palm    1.8 <2.2 45.9 Median Palm Ulnar Palm 0.1   Ulnar Palm    1.7 <2.2 13.0       Electromyography   Side Muscle Ins.Act Fibs Fasc Recrt Amp Dur Poly Activation  Comment  Right 1stDorInt Nml Nml Nml Nml Nml Nml Nml Nml N/A  Right PronatorTeres Nml Nml Nml Nml Nml Nml Nml Nml N/A  Right Biceps Nml Nml Nml Nml Nml Nml Nml Nml N/A  Right Triceps Nml Nml Nml Nml Nml Nml Nml Nml N/A  Right  Deltoid Nml Nml Nml Nml Nml Nml Nml Nml N/A  Left 1stDorInt Nml Nml Nml Nml Nml Nml Nml Nml N/A  Left PronatorTeres Nml Nml Nml Nml Nml Nml Nml Nml N/A  Left Biceps Nml Nml Nml Nml Nml Nml Nml Nml N/A  Left Triceps Nml Nml Nml Nml Nml Nml Nml Nml N/A  Left Deltoid Nml Nml Nml Nml Nml Nml Nml Nml N/A   Waveforms:                        Note: Reviewed        Physical Exam  Vitals: BP (!) 142/73 (Patient Position: Sitting, Cuff Size: Normal)   Pulse 63   Temp 98 F (36.7 C) (Temporal)   Resp 14   Ht 5' 3 (1.6 m)   Wt 242 lb (109.8 kg)   LMP  (LMP Unknown)   SpO2 100%   BMI 42.87 kg/m  BMI: Estimated body mass index is 42.87 kg/m as calculated from the following:   Height as of this encounter: 5' 3 (1.6 m).   Weight as of this encounter: 242 lb (109.8 kg). Ideal: Ideal body weight: 52.4 kg (115 lb 8.3 oz) Adjusted ideal body weight: 75.3 kg (166 lb 1.8 oz) General appearance: Well nourished, well developed, and well hydrated. In no apparent acute distress Mental status: Alert, oriented x 3 (person, place, & time)       Respiratory: No evidence of acute respiratory distress Eyes: PERLA  Cervical Spine Area Exam  Skin & Axial Inspection: Well healed scar from previous spine surgery detected Alignment:  Symmetrical Functional ROM: Pain restricted ROM, bilaterally Stability: No instability detected Muscle Tone/Strength: Functionally intact. No obvious neuro-muscular anomalies detected. Sensory (Neurological): Dermatomal pain pattern Palpation: No palpable anomalies             Upper Extremity (UE) Exam    Side: Right upper extremity  Side: Left upper extremity  Skin & Extremity Inspection: Skin color, temperature, and hair growth are WNL. No peripheral edema or cyanosis. No masses, redness, swelling, asymmetry, or associated skin lesions. No contractures.  Skin & Extremity Inspection: Skin color, temperature, and hair growth are WNL. No peripheral edema or cyanosis. No masses, redness, swelling, asymmetry, or associated skin lesions. No contractures.  Functional ROM: Unrestricted ROM          Functional ROM: Decreased ROM for all joints of upper extremity  Muscle Tone/Strength: Functionally intact. No obvious neuro-muscular anomalies detected.  Muscle Tone/Strength: Functionally intact. No obvious neuro-muscular anomalies detected.  Sensory (Neurological): Unimpaired          Sensory (Neurological): Dermatomal pain pattern          Palpation: No palpable anomalies              Palpation: No palpable anomalies              Provocative Test(s):  Phalen's test: deferred Tinel's test: deferred Apley's scratch test (touch opposite shoulder):  Action 1 (Across chest): deferred Action 2 (Overhead): deferred Action 3 (LB reach): deferred   Provocative Test(s):  Phalen's test: deferred Tinel's test: deferred Apley's scratch test (touch opposite shoulder):  Action 1 (Across chest): Decreased ROM Action 2 (Overhead): Decreased ROM Action 3 (LB reach): Decreased ROM     Assessment   Diagnosis Status  1. Cervical radicular pain   2. Bilateral occipital neuralgia   3. Chronic pain syndrome    Having a Flare-up Having a Flare-up Having a  Flare-up   Updated Problems: Problem  Cervical  Radicular Pain  Bilateral Occipital Neuralgia    Plan of Care  MATTALYN ANDEREGG has a history of greater than 3 months of moderate to severe pain which is resulted in functional impairment.  The patient has tried various conservative therapeutic options such as NSAIDs, Tylenol , muscle relaxants, physical therapy which was inadequately effective.  Patient's pain is predominantly radicular with physical exam findings suggestive of cervical radiculopathy.  She also has occipital neuralgia.  We discussed cervical epidural steroid injection as well as bilateral occipital nerve block to help address her cervical radicular pain and bilateral occipital neuralgia respectively.  1. Cervical radicular pain (Primary) - Cervical Epidural Injection; Future  2. Bilateral occipital neuralgia - GREATER OCCIPITAL NERVE BLOCK; Future  3. Chronic pain syndrome - Cervical Epidural Injection; Future - GREATER OCCIPITAL NERVE BLOCK; Future  Continue with medication management as prescribed   Orders:  Orders Placed This Encounter  Procedures   Cervical Epidural Injection    Sedation: Patient's choice. Purpose: Diagnostic/Therapeutic Indication(s): Radiculitis and cervicalgia associater with cervical degenerative disc disease.    Standing Status:   Future    Expiration Date:   06/22/2024    Scheduling Instructions:     Procedure: Cervical Epidural Steroid Injection/Block     Level(s): C7-T1     Laterality: TBD     Timeframe: As soon as schedule allows.    Where will this procedure be performed?:   [facility]             Simeon Vera   GREATER OCCIPITAL NERVE BLOCK    Standing Status:   Future    Expected Date:   04/05/2024    Expiration Date:   03/22/2025    Scheduling Instructions:     Procedure: Occipital nerve block     Laterality: Bilateral     Sedation: IV Versed      Timeframe: ASAA    Where will this procedure be performed?:   [facility]    Return in about 15 days (around  04/06/2024) for B/L GONB + C-ESI , in clinic IV Versed .    Recent Visits Date Type Provider Dept  03/08/24 Office Visit Patel, Seema K, NP [facility]  02/01/24 Procedure visit Marcelino Nurse, MD [facility]  01/12/24 Office Visit Patel, Seema K, NP [facility]  Showing recent visits within past 90 days and meeting all other requirements Today's Visits Date Type Provider Dept  03/22/24 Office Visit Marcelino Nurse, MD [facility]  Showing today's visits and meeting all other requirements Future Appointments Date Type Provider Dept  04/07/24 Appointment Patel, Seema K, NP [facility]  Showing future appointments within next 90 days and meeting all other requirements  I discussed the assessment and treatment plan with the patient. The patient was provided an opportunity to ask questions and all were answered. The patient agreed with the plan and demonstrated an understanding of the instructions.  Patient advised to call back or seek an in-person evaluation if the symptoms or condition worsens.  Duration of encounter: .  Total time on encounter, as per AMA guidelines included both the face-to-face and non-face-to-face time personally spent by the physician and/or other qualified health care professional(s) on the day of the encounter (includes time in activities that require the physician or other qualified health care professional and does not include time in activities normally performed by clinical staff). Physician's time may include the following activities when performed:  Preparing to see the patient (e.g., pre-charting review of records, searching for previously ordered imaging, lab work, and nerve conduction tests) Review of prior analgesic pharmacotherapies. Reviewing PMP Interpreting ordered tests (e.g., lab work, imaging, nerve conduction tests) Performing post-procedure evaluations, including interpretation of diagnostic  procedures Obtaining and/or reviewing separately obtained history Performing a medically appropriate examination and/or evaluation Counseling and educating the patient/family/caregiver Ordering medications, tests, or procedures Referring and communicating with other health care professionals (when not separately reported) Documenting clinical information in the electronic or other health record Independently interpreting results (not separately reported) and communicating results to the patient/ family/caregiver Care coordination (not separately reported)  Note by: Wallie Sherry, MD (TTS and AI technology used. I apologize for any typographical errors that were not detected and corrected.) Date: 03/22/2024; Time: 4:02 PM

## 2024-03-22 NOTE — Progress Notes (Signed)
 Safety precautions to be maintained throughout the outpatient stay will include: orient to surroundings, keep bed in low position, maintain call bell within reach at all times, provide assistance with transfer out of bed and ambulation.

## 2024-03-22 NOTE — Addendum Note (Signed)
 Addended byBETHA HILMA HASTINGS on: 03/22/2024 12:54 PM   Modules accepted: Orders

## 2024-03-22 NOTE — Patient Instructions (Signed)

## 2024-03-23 ENCOUNTER — Ambulatory Visit (HOSPITAL_COMMUNITY): Admitting: Professional

## 2024-03-23 ENCOUNTER — Other Ambulatory Visit: Payer: Self-pay | Admitting: Medical Genetics

## 2024-03-23 DIAGNOSIS — F411 Generalized anxiety disorder: Secondary | ICD-10-CM

## 2024-03-23 DIAGNOSIS — F332 Major depressive disorder, recurrent severe without psychotic features: Secondary | ICD-10-CM | POA: Diagnosis not present
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
## 2024-03-23 NOTE — Psych (Signed)
 Virtual Visit via Video Note  I connected with Anita Gibbs on 03/11/24 at  9:00 AM EDT by a video enabled telemedicine application and verified that I am speaking with the correct person using two identifiers.  Location: Patient: Patient home Provider: clinical home office   I discussed the limitations of evaluation and management by telemedicine and the availability of in person appointments. The patient expressed understanding and agreed to proceed.  I discussed the assessment and treatment plan with the patient. The patient was provided an opportunity to ask questions and all were answered. The patient agreed with the plan and demonstrated an understanding of the instructions.   The patient was advised to call back or seek an in-person evaluation if the symptoms worsen or if the condition fails to improve as anticipated.  Pt was provided 240 minutes of non-face-to-face time during this encounter.   Randall Bastos, LCSW   [facility] BH PHP THERAPIST PROGRESS NOTE  Anita Gibbs [id number]  Session Time: 9:00 - 10:00  Participation Level: Active  Behavioral Response: CasualAlertDepressed  Type of Therapy: Group Therapy  Treatment Goals addressed: Coping  Progress Towards Goals: Progressing  Interventions: CBT, DBT, Supportive, and Reframing  Summary: Anita Gibbs is a 53 y.o. female who presents with depression and anxiety symptoms.  Clinician led check-in regarding current stressors and situation, and review of patient completed daily inventory. Clinician utilized active listening and empathetic response and validated patient emotions. Clinician facilitated processing group on pertinent issues.   Therapist Response: Patient arrived within time allowed. Patient rates her mood at a 4 on a scale of 1-10 with 10 being best. Pt states she feels not good. Pt states she slept 2 hours and ate 2x. Pt reports it is another high pain and she is in bed. Pt reports yesterday was a  leave me alone day and she isolated herself. Pt states struggles with utilizing distraction due to focus. Pt states she feels like things are crawling on me and it is unclear whether this is a nerve pain sensation or tactile hallucinations. Pt states she does not see anything on her. Pt agrees to keep cln updated on any changes or escalation to this issue. Pt able to process. Pt engaged in discussion.          Session Time: 10:00 am - 11:00 am   Participation Level: Active   Behavioral Response: CasualAlertDepressed   Type of Therapy: Group Therapy   Treatment Goals addressed: Coping   Progress Towards Goals: Progressing   Interventions: CBT, DBT, Solution Focused, Strength-based, Supportive, and Reframing   Summary: Cln introduced DBT distress tolerance distraction skills. Cln provides context for distraction skills and why distraction is a foundational way to manage mood dysregulation. Group discussed how to apply distraction.    Therapist Response: Pt engaged in discussion and reports understanding of how distraction can be applied to manage big feelings.          Session Time: 11:00 -12:00   Participation Level: Active   Behavioral Response: CasualAlertDepressed   Type of Therapy: Group Therapy   Treatment Goals addressed: Coping   Progress Towards Goals: Progressing   Interventions: CBT, DBT, Solution Focused, Strength-based, Supportive, and Reframing   Summary: Cln led discussion on ways to manage stressors and feelings over the weekend. Group members  brainstormed things to do over the weekend for multiple levels of energy, access, and moods. Cln reviewed crisis services should they be needed and provided pt's with the text crisis line,  mobile crisis, national suicide hotline, [facility] 24/7 line, and information on [facility].      Therapist Response: Pt engaged in discussion and is able to identify 3 ideas of what to do over the weekend to keep their mind engaged.            Session Time: 12:00 -1:00   Participation Level: Active   Behavioral Response: CasualAlertDepressed   Type of Therapy: Group therapy   Treatment Goals addressed: Coping   Progress Towards Goals: Progressing   Interventions: OT group   Summary: 12:00 - 12:50: Occupational Therapy group with cln E. Hollan.  12:50 - 1:00 Clinician assessed for immediate needs, medication compliance and efficacy, and safety concerns.   Therapist Response: 12:00 - 12:50: Pt participated 12:50 - 1:00 pm: At check-out, patient reports no immediate concerns. Patient demonstrates progress as evidenced by continued engagement and responsiveness to treatment. Patient denies SI/HI/self-harm thoughts at the end of group.    Suicidal/Homicidal: Nowithout intent/plan  Plan: Pt will continue in PHP while working to decrease depression and anxiety symptoms, increase daily functioning, and increase ability to manage symptoms in a healthy manner.   Collaboration of Care: Medication Management AEB T Lewis, NP  Patient/Guardian was advised Release of Information must be obtained prior to any record release in order to collaborate their care with an outside provider. Patient/Guardian was advised if they have not already done so to contact the registration department to sign all necessary forms in order for us  to release information regarding their care.   Consent: Patient/Guardian gives verbal consent for treatment and assignment of benefits for services provided during this visit. Patient/Guardian expressed understanding and agreed to proceed.   Diagnosis: Severe recurrent major depression without psychotic features (HCC) [F33.2]    1. Severe recurrent major depression without psychotic features (HCC)   2. GAD (generalized anxiety disorder)       Randall Bastos, LCSW

## 2024-03-24 ENCOUNTER — Ambulatory Visit (HOSPITAL_COMMUNITY): Admitting: Licensed Clinical Social Worker

## 2024-03-24 ENCOUNTER — Other Ambulatory Visit
Admission: RE | Admit: 2024-03-24 | Discharge: 2024-03-24 | Disposition: A | Discharge status: Home / Self Care | Payer: Self-pay | Source: Ambulatory Visit | Attending: Medical Genetics | Admitting: Medical Genetics

## 2024-03-24 DIAGNOSIS — F332 Major depressive disorder, recurrent severe without psychotic features: Secondary | ICD-10-CM | POA: Diagnosis not present

## 2024-03-24 NOTE — Progress Notes (Cosign Needed Addendum)
 Virtual Visit via Video Note  I connected with Anita Gibbs on 03/24/24 at  9:00 AM EDT by a video enabled telemedicine application and verified that I am speaking with the correct person using two identifiers.  Location: Patient: Home Provider: Office   I discussed the limitations of evaluation and management by telemedicine and the availability of in person appointments. The patient expressed understanding and agreed to proceed.   I discussed the assessment and treatment plan with the patient. The patient was provided an opportunity to ask questions and all were answered. The patient agreed with the plan and demonstrated an understanding of the instructions.   The patient was advised to call back or seek an in-person evaluation if the symptoms worsen or if the condition fails to improve as anticipated.  I provided 15 minutes of non-face-to-face time during this encounter.   Staci LOISE Kerns, NP   [facility] Partial Outpatient Program- [facility] Discharge Summary  Anita Gibbs [id number]  Admission date: 03/07/2024 Discharge date: 03/25/2024  Reason for admission: per admission assessment note:Anita Gibbs 53 year old African-American female seen and evaluated for weekly progress assessment while attending partial hospitalization programming.  She reports this is her second day of session.  She carries a diagnosis related to major depressive disorder, generalized anxiety disorder and often struggles with pain.  States she is followed by pain management with a follow-up appointment today.  She reports sleep disturbance however, attributes it to current diagnosis related to joint pain degenerative disc disease,  seronegatively spondyloarthropathy and sciatica.  Documented sleep study scheduled through primary care.   Progress in Program Toward Treatment Goals: Progressing patient attended participated with daily group session with active engaged participation.   Denying any suicidal or homicidal ideations.  Denies auditory visual hallucinations at discharge.  She declined medication refills at this time.  She reports she is eager to follow-up with neurology as she has a appointment scheduled this October.  Has concerns related to MS diagnoses.  Overall, has been medication compliant continues to struggle with pain symptoms related to medical diagnoses.  Reports utilizing coping skills.  No other documented concerns at discharge.  Support encouragement reassurance was provided.  Progress (rationale): Improvement was monitored by  daily group setting and observation with reported symptoms. Anita Gibbs daily report of symptom reduction, by utilizing coping skills.  Emotional and mental status was monitored by daily self-inventory verbal check in completed by LCSW's      Upon completion of this outpatient program. Anita Gibbs was both mentally and medically stable for discharge denying suicidal/homicidal ideation, auditory/visual/tactile hallucinations, delusional thoughts and paranoia.     Collaboration of Care: Medication Management AEB psychiatrist [place]  Patient/Guardian was advised Release of Information must be obtained prior to any record release in order to collaborate their care with an outside provider. Patient/Guardian was advised if they have not already done so to contact the registration department to sign all necessary forms in order for us  to release information regarding their care.   Consent: Patient/Guardian gives verbal consent for treatment and assignment of benefits for services provided during this visit. Patient/Guardian expressed understanding and agreed to proceed.   Staci Kerns NP 03/24/2024

## 2024-03-25 ENCOUNTER — Ambulatory Visit (HOSPITAL_COMMUNITY)

## 2024-03-25 DIAGNOSIS — F332 Major depressive disorder, recurrent severe without psychotic features: Secondary | ICD-10-CM

## 2024-03-25 DIAGNOSIS — F411 Generalized anxiety disorder: Secondary | ICD-10-CM | POA: Diagnosis not present

## 2024-03-25 NOTE — Psych (Signed)
 Virtual Visit via Video Note  I connected with Anita Gibbs on 03/23/24 at  9:00 AM EDT by a video enabled telemedicine application and verified that I am speaking with the correct person using two identifiers.  Location: Patient: Patient home Provider: clinical home office   I discussed the limitations of evaluation and management by telemedicine and the availability of in person appointments. The patient expressed understanding and agreed to proceed.  I discussed the assessment and treatment plan with the patient. The patient was provided an opportunity to ask questions and all were answered. The patient agreed with the plan and demonstrated an understanding of the instructions.   The patient was advised to call back or seek an in-person evaluation if the symptoms worsen or if the condition fails to improve as anticipated.  Pt was provided 240 minutes of non-face-to-face time during this encounter.   Benton JINNY Devoid, [facility]   [facility] BH PHP THERAPIST PROGRESS NOTE  Anita Gibbs [id number]  Session Time: 9:00 - 10:00  Participation Level: Active  Behavioral Response: CasualAlertDepressed  Type of Therapy: Group Therapy  Treatment Goals addressed: Coping  Progress Towards Goals: Progressing  Interventions: CBT, DBT, Supportive, and Reframing  Summary: Anita Gibbs is a 53 y.o. female who presents with depression and anxiety symptoms. Clinician led check-in regarding current stressors and situation, and review of patient completed daily inventory. Clinician utilized active listening and empathetic response and validated patient emotions. Clinician facilitated processing group on pertinent issues.   Therapist Response: Patient arrived within time allowed. Patient rates her mood at a 5 on a scale of 1-10 with 10 being best. Pt states she feels OK. Pt states she slept 7 hours and ate 1x. Pt reports she had 3 doctor appointments yesterday and is hopeful for some pain relief  from a future neck epidoral.  Pt able to process. Pt engaged in discussion.          Session Time: 10:00 am - 11:00 am   Participation Level: Active   Behavioral Response: CasualAlertDepressed   Type of Therapy: Group Therapy   Treatment Goals addressed: Coping   Progress Towards Goals: Progressing   Interventions: CBT, DBT, Solution Focused, Strength-based, Supportive, and Reframing   Summary:  Cln led processing group for pt's current struggles. Group members shared stressors and provided support and feedback. Cln brought in topics of boundaries, healthy relationships, and communication to inform discussion.    Therapist Response: Pt able to process and provide support to group.         Session Time: 11:00 -12:00   Participation Level: Active   Behavioral Response: CasualAlertDepressed   Type of Therapy: Group Therapy   Treatment Goals addressed: Coping   Progress Towards Goals: Progressing   Interventions: Strength-based, Supportive, and Reframing   Summary: Chaplaincy group with K. Claussen   Therapist Response: Pt participated and engaged in discussion.           Session Time: 12:00 -1:00   Participation Level: Active   Behavioral Response: CasualAlertDepressed   Type of Therapy: Group therapy   Treatment Goals addressed: Coping   Progress Towards Goals: Progressing   Interventions: OT group   Summary: 12:00 - 12:50: Occupational Therapy group with cln E. Hollan.  12:50 - 1:00 Clinician assessed for immediate needs, medication compliance and efficacy, and safety concerns.   Therapist Response: 12:00 - 12:50: Pt participated 12:50 - 1:00 pm: At check-out, patient reports no immediate concerns. Patient demonstrates progress as evidenced by continued engagement  and responsiveness to treatment. Patient denies SI/HI/self-harm thoughts at the end of group.    Suicidal/Homicidal: Nowithout intent/plan  Plan: Pt will continue in PHP while working to  decrease depression and anxiety symptoms, increase daily functioning, and increase ability to manage symptoms in a healthy manner.   Collaboration of Care: Medication Management AEB T Lewis, NP  Patient/Guardian was advised Release of Information must be obtained prior to any record release in order to collaborate their care with an outside provider. Patient/Guardian was advised if they have not already done so to contact the registration department to sign all necessary forms in order for us  to release information regarding their care.   Consent: Patient/Guardian gives verbal consent for treatment and assignment of benefits for services provided during this visit. Patient/Guardian expressed understanding and agreed to proceed.   Diagnosis: Severe episode of recurrent major depressive disorder, without psychotic features (HCC) [F33.2]    1. Severe episode of recurrent major depressive disorder, without psychotic features (HCC)   2. GAD (generalized anxiety disorder)       Benton JINNY Devoid, [facility]

## 2024-03-28 ENCOUNTER — Encounter (HOSPITAL_COMMUNITY): Payer: Self-pay

## 2024-03-28 ENCOUNTER — Ambulatory Visit (HOSPITAL_COMMUNITY)

## 2024-03-29 ENCOUNTER — Ambulatory Visit: Admitting: Student in an Organized Health Care Education/Training Program

## 2024-03-29 ENCOUNTER — Ambulatory Visit (HOSPITAL_COMMUNITY)

## 2024-03-29 ENCOUNTER — Other Ambulatory Visit: Payer: Self-pay | Admitting: Psychiatry

## 2024-03-29 DIAGNOSIS — F411 Generalized anxiety disorder: Secondary | ICD-10-CM
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
## 2024-03-30 ENCOUNTER — Ambulatory Visit (HOSPITAL_COMMUNITY)

## 2024-03-30 ENCOUNTER — Encounter: Payer: Self-pay | Admitting: Nurse Practitioner

## 2024-03-30 NOTE — Assessment & Plan Note (Signed)
 Chronic issue. She reports bilateral leg swelling and continues taking hydrochlorothiazide  and potassium despite previous as-needed instructions due to kidney concerns. Hydrochlorothiazide  lowers potassium, necessitating supplementation. Check kidney function, continue potassium supplementation, and advise on adequate hydration. Advise daily compression stockings and elevating legs.

## 2024-03-30 NOTE — Assessment & Plan Note (Signed)
 Her last HbA1c was 6.1 in March, indicating prediabetes. She reports increased thirst and fatigue and does not monitor blood glucose at home. She is on Zepbound  for weight loss and diabetes management, which may be titrated based on HbA1c. Risks include pancreatitis, gastroparesis, nausea, abdominal pain, and acid reflux. Order an HbA1c test, continue Zepbound  with possible titration based on results, and encourage a healthy diet and exercise, including water aerobics. Monitor for Zepbound  side effects such as nausea, abdominal pain, and acid reflux.

## 2024-03-30 NOTE — Psych (Signed)
 Virtual Visit via Video Note  I connected with Anita Gibbs on 03/25/24 at  9:00 AM EDT by a video enabled telemedicine application and verified that I am speaking with the correct person using two identifiers.  Location: Patient: Patient home Provider: clinical home office   I discussed the limitations of evaluation and management by telemedicine and the availability of in person appointments. The patient expressed understanding and agreed to proceed.  I discussed the assessment and treatment plan with the patient. The patient was provided an opportunity to ask questions and all were answered. The patient agreed with the plan and demonstrated an understanding of the instructions.   The patient was advised to call back or seek an in-person evaluation if the symptoms worsen or if the condition fails to improve as anticipated.  Pt was provided 240 minutes of non-face-to-face time during this encounter.   Randall Bastos, LCSW   [facility] BH PHP THERAPIST PROGRESS NOTE  Anita Gibbs [id number]  Session Time: 9:00 - 10:00  Participation Level: Active  Behavioral Response: CasualAlertDepressed  Type of Therapy: Group Therapy  Treatment Goals addressed: Coping  Progress Towards Goals: Progressing  Interventions: CBT, DBT, Supportive, and Reframing  Summary: Anita Gibbs is a 53 y.o. female who presents with depression and anxiety symptoms.  Clinician led check-in regarding current stressors and situation, and review of patient completed daily inventory. Clinician utilized active listening and empathetic response and validated patient emotions. Clinician facilitated processing group on pertinent issues.   Therapist Response: Patient arrived within time allowed. Patient rates her mood at a 7 on a scale of 1-10 with 10 being best. Pt states she feels pretty good. Pt states she slept 14 hours and ate 2x. Pt reports she was worn out and hurting yesterday so went to bed early. Pt  states waking up for a few hours in the night, but getting two big chunks of sleep. Pt states her body is feeling better today and thinks the shots she had earlier in the week are working. Pt states she made plans with a friend for next weekend to go out of town and is looking forward to it. Pt states she is similing for the first time in a while. Pt able to process. Pt engaged in discussion.          Session Time: 10:00 am - 11:00 am   Participation Level: Active   Behavioral Response: CasualAlertDepressed   Type of Therapy: Group Therapy   Treatment Goals addressed: Coping   Progress Towards Goals: Progressing   Interventions: CBT, DBT, Solution Focused, Strength-based, Supportive, and Reframing   Summary: Cln led discussion on loss of appetite and the importance of nutrition. Cln discussed the role nutrition can play in emotion regulation. Group members discussed the barriers they have to eating when they are symptomatic and worked to brainstorm ways to work around them.    Therapist Response:  Pt states struggling with the motivation to prepare food to eat and is able to determine steps to manage.          Session Time: 11:00 -12:00   Participation Level: Active   Behavioral Response: CasualAlertDepressed   Type of Therapy: Group Therapy   Treatment Goals addressed: Coping   Progress Towards Goals: Progressing   Interventions: CBT, DBT, Solution Focused, Strength-based, Supportive, and Reframing   Summary: Cln led discussion on thought fallacies, utilizing handout Eleven Beliefs that Make Life Better. Group reviewed handout and discussed the struggles with believing these beliefs  and how to reinforce them in our lives.    Therapist Response:  Pt engaged in discussion and identifies struggling most with accepting what she cannot control.             Session Time: 12:00 -1:00   Participation Level: Active   Behavioral Response: CasualAlertDepressed   Type of  Therapy: Group therapy   Treatment Goals addressed: Coping   Progress Towards Goals: Progressing   Interventions: OT group   Summary: 12:00 - 12:50: Occupational Therapy group with cln E. Hollan.  12:50 - 1:00 Clinician assessed for immediate needs, medication compliance and efficacy, and safety concerns.   Therapist Response: 12:00 - 12:50: Pt participated 12:50 - 1:00 pm: At check-out, patient reports no immediate concerns. Patient demonstrates progress as evidenced by continued engagement and responsiveness to treatment. Patient denies SI/HI/self-harm thoughts at the end of group.     Suicidal/Homicidal: Nowithout intent/plan  Plan: Pt will discharge from PHP due to meeting treatment goals of decreased depression and anxiety symptoms, increased daily functioning, and increased ability to manage symptoms in a healthy manner. Pt will return to previous outpatient providers for continuing care. Pt and provider are aligned with discharge plan. Pt denies SI/HI at time of discharge.   Collaboration of Care: Medication Management AEB T Lewis, NP  Patient/Guardian was advised Release of Information must be obtained prior to any record release in order to collaborate their care with an outside provider. Patient/Guardian was advised if they have not already done so to contact the registration department to sign all necessary forms in order for us  to release information regarding their care.   Consent: Patient/Guardian gives verbal consent for treatment and assignment of benefits for services provided during this visit. Patient/Guardian expressed understanding and agreed to proceed.   Diagnosis: Severe episode of recurrent major depressive disorder, without psychotic features (HCC) [F33.2]    1. Severe episode of recurrent major depressive disorder, without psychotic features (HCC)   2. GAD (generalized anxiety disorder)       Randall Bastos, LCSW
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
## 2024-03-30 NOTE — Assessment & Plan Note (Signed)
 She was referred to Dr. Maree for an MS evaluation following a neurosurgeon referral. A previous EMG of the upper extremities was normal. Follow up with Dr. Maree for further evaluation.

## 2024-03-30 NOTE — Assessment & Plan Note (Signed)
 She is undergoing infusions with no significant pain improvement. Follow up with physical med and rheumatology as scheduled.

## 2024-03-30 NOTE — Assessment & Plan Note (Signed)
 Her blood pressure is well-controlled with amlodipine , hydrochlorothiazide , and losartan . She experiences no chest pain, but shortness of breath and lightheadedness may be related to pain. Continue current antihypertensive medications.

## 2024-03-30 NOTE — Psych (Signed)
 Virtual Visit via Video Note  I connected with Anita Gibbs on 03/21/24 at  9:00 AM EDT by a video enabled telemedicine application and verified that I am speaking with the correct person using two identifiers.  Location: Patient: Patient home Provider: clinical home office   I discussed the limitations of evaluation and management by telemedicine and the availability of in person appointments. The patient expressed understanding and agreed to proceed.  I discussed the assessment and treatment plan with the patient. The patient was provided an opportunity to ask questions and all were answered. The patient agreed with the plan and demonstrated an understanding of the instructions.   The patient was advised to call back or seek an in-person evaluation if the symptoms worsen or if the condition fails to improve as anticipated.  Pt was provided 240 minutes of non-face-to-face time during this encounter.   Randall Bastos, LCSW   [facility] BH PHP THERAPIST PROGRESS NOTE  Anita Gibbs [id number]  Session Time: 9:00 - 10:00  Participation Level: Active  Behavioral Response: CasualAlertDepressed  Type of Therapy: Group Therapy  Treatment Goals addressed: Coping  Progress Towards Goals: Progressing  Interventions: CBT, DBT, Supportive, and Reframing  Summary: Anita Gibbs is a 53 y.o. female who presents with depression and anxiety symptoms.  Clinician led check-in regarding current stressors and situation, and review of patient completed daily inventory. Clinician utilized active listening and empathetic response and validated patient emotions. Clinician facilitated processing group on pertinent issues.   Therapist Response: Patient arrived within time allowed. Patient rates her mood at a 3 on a scale of 1-10 with 10 being best. Pt states she feels worn out. Pt states she slept 0 hours and ate 1x. Pt reports she is experiencing a migraine and did not sleep last night due to  pain. Pt reports she is experiencing anxiety and dread re: upcoming appointments this week. Pt states she is over it and wants relief instead of endless appointments. Pt shares her weekend was uneventful and she isolated in her house because she didn't want to be bothered.  Pt able to process. Pt engaged in discussion.          Session Time: 10:00 am - 11:00 am   Participation Level: Active   Behavioral Response: CasualAlertDepressed   Type of Therapy: Group Therapy   Treatment Goals addressed: Coping   Progress Towards Goals: Progressing   Interventions: CBT, DBT, Solution Focused, Strength-based, Supportive, and Reframing   Summary: Cln led processing group for pt's current struggles. Group members shared stressors and provided support and feedback. Cln brought in topics of boundaries, healthy relationships, and unhealthy thought processes to inform discussion.    Therapist Response: Pt able to process and provide support to group.           Session Time: 11:00 -12:00   Participation Level: Active   Behavioral Response: CasualAlertDepressed   Type of Therapy: Group Therapy   Treatment Goals addressed: Coping   Progress Towards Goals: Progressing   Interventions: CBT, DBT, Solution Focused, Strength-based, Supportive, and Reframing   Summary: Cln introduced topic of boundaries. Cln discussed how boundaries inform our relationships and affect self-esteem and personal agency. Group discussed the three types of boundaries: rigid, porous, and healthy and when each type is most helpful/harmful.    Therapist Response: Pt engaged in discussion and is able to identify each type in their life.             Session Time: 12:00 -1:00  Participation Level: Active   Behavioral Response: CasualAlertDepressed   Type of Therapy: Group therapy   Treatment Goals addressed: Coping   Progress Towards Goals: Progressing   Interventions: OT group   Summary: 12:00 -  12:50: Occupational Therapy group with cln E. Hollan.  12:50 - 1:00 Clinician assessed for immediate needs, medication compliance and efficacy, and safety concerns.   Therapist Response: 12:00 - 12:50: Pt participated 12:50 - 1:00 pm: At check-out, patient reports no immediate concerns. Patient demonstrates progress as evidenced by continued engagement and responsiveness to treatment. Patient denies SI/HI/self-harm thoughts at the end of group.     Suicidal/Homicidal: Nowithout intent/plan  Plan: Pt will continue in PHP while working to decrease depression and anxiety symptoms, increase daily functioning, and increase ability to manage symptoms in a healthy manner.   Collaboration of Care: Medication Management AEB T Lewis, NP  Patient/Guardian was advised Release of Information must be obtained prior to any record release in order to collaborate their care with an outside provider. Patient/Guardian was advised if they have not already done so to contact the registration department to sign all necessary forms in order for us  to release information regarding their care.   Consent: Patient/Guardian gives verbal consent for treatment and assignment of benefits for services provided during this visit. Patient/Guardian expressed understanding and agreed to proceed.   Diagnosis: Severe episode of recurrent major depressive disorder, without psychotic features (HCC) [F33.2]    1. Severe episode of recurrent major depressive disorder, without psychotic features (HCC)   2. GAD (generalized anxiety disorder)       Randall Bastos, LCSW

## 2024-03-31 ENCOUNTER — Encounter: Payer: Self-pay | Admitting: Psychiatry

## 2024-03-31 ENCOUNTER — Ambulatory Visit (HOSPITAL_COMMUNITY)

## 2024-03-31 ENCOUNTER — Telehealth: Admitting: Psychiatry

## 2024-03-31 DIAGNOSIS — F411 Generalized anxiety disorder: Secondary | ICD-10-CM

## 2024-03-31 DIAGNOSIS — F3342 Major depressive disorder, recurrent, in full remission: Secondary | ICD-10-CM | POA: Diagnosis not present

## 2024-03-31 DIAGNOSIS — G4701 Insomnia due to medical condition: Secondary | ICD-10-CM | POA: Diagnosis not present

## 2024-03-31 NOTE — Progress Notes (Unsigned)
 Virtual Visit via Video Note  I connected with Anita Gibbs on 03/31/24 at  1:40 PM EDT by a video enabled telemedicine application and verified that I am speaking with the correct person using two identifiers.  Location Provider Location : [facility] Patient Location : Home  Participants: Patient , Provider    I discussed the limitations of evaluation and management by telemedicine and the availability of in person appointments. The patient expressed understanding and agreed to proceed.   I discussed the assessment and treatment plan with the patient. The patient was provided an opportunity to ask questions and all were answered. The patient agreed with the plan and demonstrated an understanding of the instructions.   The patient was advised to call back or seek an in-person evaluation if the symptoms worsen or if the condition fails to improve as anticipated.    [facility] MD OP Progress Note  04/01/2024 7:55 AM Anita Gibbs  MRN:  [id number]  Chief Complaint:  Chief Complaint  Patient presents with   Follow-up   Anxiety   Depression   Medication Refill   HPI: Anita Gibbs is a 53 year old African-American female, previously widowed, currently engaged, unemployed, lives in [place], has a history of MDD, GAD, chronic pain, hypertension, GERD, obesity, seronegative spondyloarthropathy was evaluated by telemedicine today for a follow-up appointment.  She completed partial hospitalization program on 03/25/2024.  I have reviewed notes per Ms. Randall Bastos.  Patient appeared to be stabilized prior to discharge.  She currently reports overall mood symptoms have improved.  She continues to have some anxiety due to situational stressors as well as her pain.  She also is worried about her disability application which is still pending.  She does struggle with sleep mostly because of her pain.  Her pregabalin  was recently up titrated.  She continues to be on a wait list for sleep apnea  study.  She is currently compliant on her medications like BuSpar , duloxetine , hydroxyzine  as needed and trazodone .  Denies side effects.  She continues to be in psychotherapy sessions and is motivated to stay in therapy.  She denies any suicidality, homicidality or perceptual disturbances.      Visit Diagnosis:    ICD-10-CM   1. Recurrent major depressive disorder, in full remission (HCC)  F33.42     2. GAD (generalized anxiety disorder)  F41.1     3. Insomnia due to medical condition  G47.01    R/O sleep apnea, pain      Past Psychiatric History: I have reviewed past psychiatric history from progress note on 01/18/2024.  Past trials of medications like Cymbalta , hydroxyzine , BuSpar , trazodone .  Completed PHP program with [facility] 03/25/2024.  Past Medical History:  Past Medical History:  Diagnosis Date   Abdominal pain 06/11/2020   Abnormal drug screen 03/06/2020   Abnormal MRI, cervical spine (06/10/2018) 09/27/2018   FINDINGS:  Vertebrae: fusion hardware at C4, C5, C6, and C7.  Posterior Fossa, vertebral arteries, paraspinal tissues: A relatively empty sella present.     Disc levels:  C3-4: Negative. A mild broad-based disc osteophyte complex present. Uncovertebral spurring contributes to mild foraminal narrowing bilaterally.  C4-5: A leftward disc osteophyte complex is present. Uncovertebral and facet disease   Acute cystitis without hematuria 06/18/2020   Acute vaginitis 06/11/2020   Adenoma of left adrenal gland 08/01/2020   Adrenal nodule (HCC) 06/20/2020   Allergy     ANA positive 02/01/2021   Anxiety and depression 01/29/2021   Arthritis  Back pain with history of spinal surgery 01/29/2021   Bilateral carpal tunnel syndrome 01/29/2021   Bilateral leg edema 01/29/2021   BMI 40.0-44.9, adult (HCC) 04/30/2022   Carpal boss, right    Carpal tunnel syndrome, bilateral    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) 09/27/2018   Levels:  C4-5: There is  partial effacement of ventral CSF.     IMPRESSION:  Mild residual central canal narrowing at C4-5 s/p ACDF.   Cervical facet hypertrophy (C3-T1) 09/27/2018   Levels:  C3-4: Uncovertebral spurring  C4-5: Uncovertebral and facet disease  C5-6: Residual uncovertebral disease.  C6-7: Residual uncovertebral disease.  C7-T1: Minimal left-sided uncovertebral spurring   Cervical facet joint syndrome (Bilateral) (L>R) 09/27/2018   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-5, C5-6, and C6-7) 09/27/2018   Levels:  C3-4: Mild foraminal narrowing bilaterally.  C4-5: Mild left foraminal narrowing.  C5-6: Mild left foraminal narrowing is due to residual uncovertebral disease.  C6-7: Mild left foraminal narrowing is due to residual uncovertebral disease.   Cervical myelopathy (HCC) 07/01/2017   Cervicalgia (Primary Area of Pain) (Bilateral) (L>R) 09/27/2018   Chronic gout of foot (Left) 05/31/2018   Chronic low back pain [facility] Area of Pain) (Bilateral) (R>L) w/ sciatica (Bilateral) 09/07/2018   Chronic low back pain [facility] Area of Pain) (Bilateral) (R>L) w/o sciatica 07/26/2018   Chronic lower extremity pain (Fourth Area of Pain) (Bilateral) (R>L) 09/07/2018   Chronic musculoskeletal pain 10/27/2018   Chronic neck pain (Bilateral) w/ history of cervical spinal surgery 09/27/2018   Chronic neck pain (Primary Area of Pain) (Bilateral) (L>R) 09/07/2018   Chronic sacroiliac joint dysfunction (Bilateral) 09/27/2018   Chronic sacroiliac joint pain (Right) 09/07/2018   Chronic upper extremity pain (Secondary Area of Pain) (Bilateral) (R>L) 09/27/2018   Disorder of skeletal system 09/07/2018   E. coli UTI (urinary tract infection) 06/20/2020   Elevated sed rate 09/08/2018   Elevated serum creatinine 06/20/2020   Excessive daytime sleepiness 03/06/2020   Female pelvic pain 09/05/2020   GERD (gastroesophageal reflux disease)    Headache    History of allergy  to shellfish 09/28/2018   History of  fusion of cervical spine (ACDF C4-C7) 09/27/2018   History of illicit drug use 03/07/2020   Hives 09/05/2020   Hives 09/05/2020   Hyperkalemia 05/31/2018   Mild   Hyperlipidemia 04/30/2022   Hypertension    Large breasts 05/30/2020   Left hip pain 07/21/2022   Lumbar facet arthropathy 04/20/2019   Lumbar radiculitis (L5 dermatome) (Right) 04/17/2020   Multiple environmental allergies 01/17/2020   Multiple food allergies 03/07/2020   Neurogenic pain 10/27/2018   Numbness and tingling of both feet 05/31/2018   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   Other intervertebral disc degeneration, lumbar region 11/15/2019   Pharmacologic therapy 09/07/2018   Plantar fasciitis, bilateral 03/04/2021   Polyneuropathy 01/29/2021   Prediabetes 10/10/2019   Problems influencing health status 09/07/2018   Rash 08/08/2019   Seasonal allergic rhinitis due to pollen 11/29/2018   Seasonal allergies 03/06/2020   Seasonal asthma 11/29/2018   Sjogren's disease (HCC)    Sleep apnea    Somatic dysfunction of sacroiliac joint (Bilateral) 09/27/2018   Spondylosis without myelopathy or radiculopathy, cervical region 03/22/2019   Spondylosis without myelopathy or radiculopathy, lumbosacral region 05/19/2019   Spondylosis, cervical, w/ myelopathy 09/27/2018   Vaginal discharge 06/11/2020    Past Surgical History:  Procedure Laterality Date   ABDOMINAL HYSTERECTOMY  10/17/2020   [facility]; she has both ovaries   ANTERIOR CERVICAL  DECOMP/DISCECTOMY FUSION N/A 07/01/2017   Procedure: ANTERIOR CERVICAL DECOMPRESSION/DISCECTOMY FUSION 3 LEVELS;  Surgeon: Clois Fret, MD;  Location: [facility] ORS;  Service: Neurosurgery;  Laterality: N/A;   CARPAL TUNNEL RELEASE Bilateral    KNEE ARTHROSCOPY Right     Family Psychiatric History: I have reviewed family psychiatric history from progress note on 01/18/2024.  Family History:  Family History  Problem Relation Age of Onset   [name] Mother     Diabetes Mother    Arthritis Mother    Hypertension Mother    Hyperlipidemia Mother    Hypertension Father    Arthritis Father    Hearing loss Father    Kidney disease Father    Lymphoma Father        large b cell    Arthritis Sister    Hypertension Sister    Cancer Brother        ? lung due to exposure    COPD Brother    Early death Brother    Post-traumatic stress disorder Brother    Breast cancer Maternal Aunt    Breast cancer Paternal Aunt    Sleep apnea Daughter    COPD Son    Depression Son    Post-traumatic stress disorder Son    Allergic rhinitis Neg Hx    Angioedema Neg Hx    Eczema Neg Hx    Urticaria Neg Hx    Asthma Neg Hx     Social History: I have reviewed social history from progress note on 01/18/2024. Social History   Socioeconomic History   Marital status: Widowed    Spouse name: Not on file   Number of children: 2   Years of education: Not on file   Highest education level: Some college, no degree  Occupational History   Not on file  Tobacco Use   Smoking status: Never   Smokeless tobacco: Never  Vaping Use   Vaping status: Never Used  Substance and Sexual Activity   Alcohol use: Yes    Alcohol/week: 2.0 standard drinks of alcohol    Types: 2 Glasses of wine per week    Comment: occ   Drug use: No   Sexual activity: Yes    Birth control/protection: None  Other Topics Concern   Not on file  Social History Narrative   Bus driver    And school custodian    1 daughter lives in [place] 72 y.o as of 01/17/20   1 son 24 almost 58 in [place]   F[employer] Darold [employer]    Social Drivers of Health   Financial Resource Strain: Medium Risk (03/14/2024)   Overall Financial Resource Strain (CARDIA)    Difficulty of Paying Living Expenses: Somewhat hard  Food Insecurity: Food Insecurity Present (03/14/2024)   Hunger Vital Sign    Worried About Running Out of Food in the Last Year: Often true    Ran Out of Food in the Last Year: Often true   Transportation Needs: No Transportation Needs (03/14/2024)   PRAPARE - Administrator, Civil Service (Medical): No    Lack of Transportation (Non-Medical): No  Physical Activity: Inactive (03/14/2024)   Exercise Vital Sign    Days of Exercise per Week: 0 days    Minutes of Exercise per Session: Not on file  Stress: Stress Concern Present (03/14/2024)   [employer] of Occupational Health - Occupational Stress Questionnaire    Feeling of Stress: Very much  Social Connections: Moderately Integrated (03/14/2024)  Social Connection and Isolation Panel    Frequency of Communication with Friends and Family: Three times a week    Frequency of Social Gatherings with Friends and Family: Twice a week    Attends Religious Services: 1 to 4 times per year    Active Member of [employer] or Organizations: Yes    Attends Banker Meetings: Never    Marital Status: Widowed    Allergies:  Allergies  Allergen Reactions   Other Hives    White and red sauces   Pineapple     Swelling  Other Reaction(s): Not available  pineapple allergenic extract   Pineapple Extract Swelling    pineapple extract   Shellfish Allergy  Hives   Strawberry (Diagnostic) Hives   Tomato Hives    cherry  Solanum (organism)    Metabolic Disorder Labs: Lab Results  Component Value Date   HGBA1C 5.9 03/16/2024   No results found for: PROLACTIN Lab Results  Component Value Date   CHOL 246 (H) 04/08/2023   TRIG 113.0 04/08/2023   HDL 64.60 04/08/2023   CHOLHDL 4 04/08/2023   VLDL 22.6 04/08/2023   LDLCALC 159 (H) 04/08/2023   LDLCALC 108 (H) 08/29/2021   Lab Results  Component Value Date   TSH 1.43 04/08/2023   TSH 2.77 01/24/2021    Therapeutic Level Labs: No results found for: LITHIUM No results found for: VALPROATE No results found for: CBMZ  Current Medications: Current Outpatient Medications  Medication Sig Dispense Refill   albuterol  (VENTOLIN  HFA) 108 (90 Base)  MCG/ACT inhaler Inhale 2 puffs into the lungs every 6 (six) hours as needed for wheezing or shortness of breath. 8 g 11   amLODipine  (NORVASC ) 5 MG tablet Take 1 tablet (5 mg total) by mouth daily. 90 tablet 0   atorvastatin  (LIPITOR) 10 MG tablet MWF at night 90 tablet 3   busPIRone  (BUSPAR ) 10 MG tablet TAKE TWO TABLETS BY MOUTH TWICE DAILY DOSAGE INCREASE 120 tablet 1   butalbital -acetaminophen -caffeine  (FIORICET) 50-325-40 MG tablet Take 1 tablet by mouth every 6 (six) hours as needed for headache. 30 tablet 0   cetirizine  (ZYRTEC  ALLERGY ) 10 MG tablet Take 1 tablet (10 mg total) by mouth at bedtime as needed. 90 tablet 3   Cholecalciferol (VITAMIN D -3) 125 MCG (5000 UT) TABS Take by mouth daily.     Cyanocobalamin  (B-12 PO) Take by mouth.     dicyclomine  (BENTYL ) 20 MG tablet TAKE 1 TABLET BY MOUTH FOUR TIMES DAILY (BEFORE MEALS AND AT BEDTIME) 120 tablet 5   DULoxetine  (CYMBALTA ) 60 MG capsule Take 1 capsule (60 mg total) by mouth 2 (two) times daily. 60 capsule 2   EPINEPHrine  (AUVI-Q ) 0.3 mg/0.3 mL IJ SOAJ injection Use as directed for severe allergic reaction 2 Device 2   fluticasone  (FLONASE ) 50 MCG/ACT nasal spray Place 2 sprays into both nostrils daily. 16 g 6   hydrochlorothiazide  (HYDRODIURIL ) 25 MG tablet      hydrocortisone  2.5 % cream Apply topically 2 (two) times daily. Prn to eyelids 30 g 0   hydrOXYzine  (VISTARIL ) 25 MG capsule Take 1 capsule (25 mg total) by mouth 3 (three) times daily. 90 capsule 2   ibuprofen (ADVIL) 800 MG tablet Take 800 mg by mouth 3 (three) times daily.     losartan  (COZAAR ) 100 MG tablet Take 1 tablet (100 mg total) by mouth daily. 90 tablet 3   Melatonin 10 MG CAPS Take 10 mg by mouth at bedtime. 30 capsule 6   methocarbamol  (  ROBAXIN ) 750 MG tablet Take 1 tablet (750 mg total) by mouth every 8 (eight) hours as needed for muscle spasms. 90 tablet 11   montelukast  (SINGULAIR ) 10 MG tablet Take 1 tablet (10 mg total) by mouth at bedtime. 90 tablet 3    pantoprazole  (PROTONIX ) 40 MG tablet TAKE ONE TABLET EVERY DAY 30 MIN BEFORE DINNER 90 tablet 3   polyethylene glycol powder (GLYCOLAX /MIRALAX ) 17 GM/SCOOP powder Take 17 g by mouth 2 (two) times daily as needed. 3350 g 1   potassium chloride  SA (KLOR-CON  M) 20 MEQ tablet Take 1 tablet (20 mEq total) by mouth daily as needed. With lasix  30 tablet 11   pregabalin  (LYRICA ) 100 MG capsule Take 1 capsule (100 mg total) by mouth 3 (three) times daily. 90 capsule 0   saccharomyces boulardii (FLORASTOR) 250 MG capsule Take 1 capsule (250 mg total) by mouth daily. 90 capsule 0   tirzepatide  (ZEPBOUND ) 2.5 MG/0.5ML Pen Inject 2.5 mg into the skin once a week. 2 mL 1   traZODone  (DESYREL ) 50 MG tablet Take 1-2 tablets (50-100 mg total) by mouth at bedtime as needed for sleep. 60 tablet 1   triamcinolone  cream (KENALOG ) 0.1 % Apply 1 Application topically 2 (two) times daily. 30 g 0   No current facility-administered medications for this visit.     Musculoskeletal: Strength & Muscle Tone: UTA Gait & Station: seated Patient leans: N/A  Psychiatric Specialty Exam: Review of Systems  Psychiatric/Behavioral:  Positive for sleep disturbance. The patient is nervous/anxious.     There were no vitals taken for this visit.There is no height or weight on file to calculate BMI.  General Appearance: Casual  Eye Contact:  Fair  Speech:  Clear and Coherent  Volume:  Normal  Mood:  Anxious  Affect:  Appropriate  Thought Process:  Goal Directed and Descriptions of Associations: Intact  Orientation:  Full (Time, Place, and Person)  Thought Content: Logical   Suicidal Thoughts:  No  Homicidal Thoughts:  No  Memory:  Immediate;   Fair Recent;   Fair Remote;   Fair  Judgement:  Fair  Insight:  Fair  Psychomotor Activity:  Normal  Concentration:  Concentration: Fair and Attention Span: Fair  Recall:  [employer] of Knowledge: Fair  Language: Fair  Akathisia:  No  Handed:  Right  AIMS (if indicated): not  done  Assets:  Communication Skills Desire for Improvement Housing Social Support  ADL's:  Intact  Cognition: WNL  Sleep:  varies , waiting for sleep study   Screenings: GAD-7    Flowsheet Row Office Visit from 01/18/2024 in [facility] Office Visit from 01/05/2024 in [facility] [employer] Visit from 11[facility]24/2023 in [facility] [employer] Visit from 11/02/2023 in [facility] [employer] Visit from 09/30/2023 in [facility] [employer]  Total GAD-7 Score 19 19 21 21 20    PHQ2-9    Flowsheet Row Video Visit from 03/31/2024 in [facility] Office Visit from 03/22/2024 in [facility] Counselor from 03/08/2024 in [facility] Counselor from 03/01/2024 in [facility] Procedure visit from 02/01/2024 in [facility] Total Score 2 1 4 4 4   PHQ-9 Total Score 5 -- 17 17 --   Flowsheet Row Video Visit  from 03/31/2024 in [facility] Counselor from 03/08/2024 in [facility] Counselor from 03/01/2024 in [facility]  C-SSRS RISK CATEGORY No Risk No Risk No Risk     Assessment and Plan: Anita Gibbs is a 53 year old African-American female who has a history of depression, anxiety was evaluated by telemedicine today.  Discussed assessment and plan as noted below.  MDD-improving Currently with improved mood symptoms, completed partial hospitalization program with positive benefit. Continue BuSpar  20 mg twice daily Continue Duloxetine  60 mg twice daily Continue psychotherapy sessions with Ms. [name].  Generalized  anxiety disorder-improving Currently reports anxiety symptoms are improving and panic attacks are less frequent Continue BuSpar  20 mg twice daily Continue Duloxetine  60 mg twice daily Continue Hydroxyzine  25 mg 3 times a day as needed Continue CBT  Insomnia-unstable Sleep problems complicated by pain as well as possible sleep apnea. Continue T to razodone 50-100 mg at bedtime as needed which was recently up titrated. Will need sufficient pain management Encouraged to complete sleep study.  Currently on a wait list.  Follow-up Follow-up in clinic in 2 months or sooner if needed.   Collaboration of Care: Collaboration of Care: Other than encouraged to complete sleep study and follow up with therapist to continue CBT.  Patient/Guardian was advised Release of Information must be obtained prior to any record release in order to collaborate their care with an outside provider. Patient/Guardian was advised if they have not already done so to contact the registration department to sign all necessary forms in order for us  to release information regarding their care.   Consent: Patient/Guardian gives verbal consent for treatment and assignment of benefits for services provided during this visit. Patient/Guardian expressed understanding and agreed to proceed.   This note was generated in part or whole with voice recognition software. Voice recognition is usually quite accurate but there are transcription errors that can and very often do occur. I apologize for any typographical errors that were not detected and corrected.    Aixa Corsello, MD 04/01/2024, 7:55 AM

## 2024-04-01 ENCOUNTER — Ambulatory Visit (HOSPITAL_COMMUNITY)

## 2024-04-05 ENCOUNTER — Other Ambulatory Visit

## 2024-04-05 DIAGNOSIS — D3502 Benign neoplasm of left adrenal gland: Secondary | ICD-10-CM | POA: Diagnosis not present

## 2024-04-07 ENCOUNTER — Encounter: Payer: Self-pay | Admitting: Nurse Practitioner

## 2024-04-07 ENCOUNTER — Ambulatory Visit: Attending: Nurse Practitioner | Admitting: Nurse Practitioner

## 2024-04-07 VITALS — BP 135/75 | HR 58 | Temp 97.4°F | Resp 16 | Ht 63.0 in | Wt 238.0 lb

## 2024-04-07 DIAGNOSIS — M5481 Occipital neuralgia: Secondary | ICD-10-CM | POA: Insufficient documentation

## 2024-04-07 DIAGNOSIS — G8929 Other chronic pain: Secondary | ICD-10-CM | POA: Insufficient documentation

## 2024-04-07 DIAGNOSIS — G894 Chronic pain syndrome: Secondary | ICD-10-CM | POA: Insufficient documentation

## 2024-04-07 DIAGNOSIS — M7918 Myalgia, other site: Secondary | ICD-10-CM | POA: Diagnosis not present

## 2024-04-07 DIAGNOSIS — M792 Neuralgia and neuritis, unspecified: Secondary | ICD-10-CM | POA: Diagnosis not present

## 2024-04-07 DIAGNOSIS — M47816 Spondylosis without myelopathy or radiculopathy, lumbar region: Secondary | ICD-10-CM | POA: Diagnosis not present

## 2024-04-07 DIAGNOSIS — Z79899 Other long term (current) drug therapy: Secondary | ICD-10-CM | POA: Insufficient documentation

## 2024-04-07 DIAGNOSIS — Z6841 Body Mass Index (BMI) 40.0 and over, adult: Secondary | ICD-10-CM | POA: Insufficient documentation

## 2024-04-07 MED ORDER — BUPRENORPHINE 15 MCG/HR TD PTWK: 1.0000 | MEDICATED_PATCH | TRANSDERMAL | 0 refills | Status: DC

## 2024-04-07 MED ORDER — PREGABALIN 100 MG PO CAPS: 100.0000 mg | ORAL_CAPSULE | Freq: Three times a day (TID) | ORAL | 2 refills | Status: DC

## 2024-04-07 MED ORDER — METHOCARBAMOL 750 MG PO TABS: 750.0000 mg | ORAL_TABLET | Freq: Three times a day (TID) | ORAL | 11 refills | Status: AC | PRN

## 2024-04-07 NOTE — Progress Notes (Signed)
 Safety precautions to be maintained throughout the outpatient stay will include: orient to surroundings, keep bed in low position, maintain call bell within reach at all times, provide assistance with transfer out of bed and ambulation.

## 2024-04-07 NOTE — Patient Instructions (Signed)
 Moderate Conscious Sedation, Adult Sedation is the use of medicines to help you relax and not feel pain. Moderate conscious sedation is a type of sedation that makes you less alert than normal. You are still able to respond to instructions, touch, or both. This type of sedation is used during short medical and dental procedures. It is milder than deep sedation, which is a type of sedation you cannot be easily woken up from. It is also milder than general anesthesia, which is the use of medicines to make you fall asleep. Moderate conscious sedation lets you return to your normal activities sooner. Tell a health care provider about: Any allergies you have. All medicines you are taking, including vitamins, herbs, steroids, eye drops, creams, and over-the-counter medicines. Any problems you or family members have had with anesthesia. Any bleeding problems you have. Any surgeries you have had. Any medical conditions you have. Whether you are pregnant or may be pregnant. Any recent alcohol, tobacco, or drug use. What are the risks? Your health care provider will talk with you about risks. These may include: Oversedation. This is when you get too much medicine. Nausea or vomiting. Allergic reaction to medicines. Trouble breathing. If this happens, a breathing tube may be used. It will be removed when you can breathe better on your own. Heart trouble. Lung trouble. Emergence delirium. This is when you feel confused while the sedation wears off. This gets better with time. What happens before the procedure? When to stop eating and drinking Follow instructions from your health care provider about what you may eat and drink. These may include: 8 hours before your procedure Stop eating most foods. Do not eat meat, fried foods, or fatty foods. Eat only light foods, such as toast or crackers. All liquids are okay except energy drinks and alcohol. 6 hours before your procedure Stop eating. Drink only  clear liquids, such as water, clear fruit juice, black coffee, plain tea, and sports drinks. Do not drink energy drinks or alcohol. 2 hours before your procedure Stop drinking all liquids. You may be allowed to take medicines with small sips of water. If you do not follow your health care provider's instructions, your procedure may be delayed or canceled. Medicines Ask your health care provider about: Changing or stopping your regular medicines. These include any diabetes medicines or blood thinners you take. Taking medicines such as aspirin and ibuprofen. These medicines can thin your blood. Do not take them unless your health care provider tells you to. Taking over-the-counter medicines, vitamins, herbs, and supplements. Tests and exams You may have an exam or testing. You may have a blood or urine sample taken. General instructions Do not use any products that contain nicotine or tobacco for at least 4 weeks before the procedure. These products include cigarettes, chewing tobacco, and vaping devices, such as e-cigarettes. If you need help quitting, ask your health care provider. If you will be going home right after the procedure, plan to have a responsible adult: Take you home from the hospital or clinic. You will not be allowed to drive. Care for you for the time you are told. What happens during the procedure?  You will be given the sedative. It may be given: As a pill you can take by mouth. It can also be put into the rectum. As a spray through the nose. As an injection into muscle. As an injection into a vein through an IV. You may be given oxygen as needed. Your blood pressure, heart  rate, breathing rate, and blood oxygen level will be monitored during the procedure. The medical or dental procedure will be done. The procedure may vary among health care providers and hospitals. What happens after the procedure? Your blood pressure, heart rate, breathing rate, and blood oxygen  level will be monitored until you leave the hospital or clinic. You will get fluids through an IV as needed. Do not drive or operate machinery until your health care provider says that it is safe. This information is not intended to replace advice given to you by your health care provider. Make sure you discuss any questions you have with your health care provider. Document Revised: 03/03/2022 Document Reviewed: 03/03/2022 Else[employer] Patient Education  2024 [employer]GENERAL RISKS AND COMPLICATIONS  What are the risk, side effects and possible complications? Generally speaking, most procedures are safe.  However, with any procedure there are risks, side effects, and the possibility of complications.  The risks and complications are dependent upon the sites that are lesioned, or the type of nerve block to be performed.  The closer the procedure is to the spine, the more serious the risks are.  Great care is taken when placing the radio frequency needles, block needles or lesioning probes, but sometimes complications can occur. Infection: Any time there is an injection through the skin, there is a risk of infection.  This is why sterile conditions are used for these blocks.  There are four possible types of infection. Localized skin infection. Central Nervous System Infection-This can be in the form of Meningitis, which can be deadly. Epidural Infections-This can be in the form of an epidural abscess, which can cause pressure inside of the spine, causing compression of the spinal cord with subsequent paralysis. This would require an emergency surgery to decompress, and there are no guarantees that the patient would recover from the paralysis. Discitis-This is an infection of the intervertebral discs.  It occurs in about 1% of discography procedures.  It is difficult to treat and it may lead to surgery.        2. Pain: the needles have to go through skin and soft tissues, will cause soreness.        3. Damage to internal structures:  The nerves to be lesioned may be near blood vessels or    other nerves which can be potentially damaged.       4. Bleeding: Bleeding is more common if the patient is taking blood thinners such as  aspirin, Coumadin, Ticiid, Plavix, etc., or if he/she have some genetic predisposition  such as hemophilia. Bleeding into the spinal canal can cause compression of the spinal  cord with subsequent paralysis.  This would require an emergency surgery to  decompress and there are no guarantees that the patient would recover from the  paralysis.       5. Pneumothorax:  Puncturing of a lung is a possibility, every time a needle is introduced in  the area of the chest or upper back.  Pneumothorax refers to free air around the  collapsed lung(s), inside of the thoracic cavity (chest cavity).  Another two possible  complications related to a similar event would include: Hemothorax and Chylothorax.   These are variations of the Pneumothorax, where instead of air around the collapsed  lung(s), you may have blood or chyle, respectively.       6. Spinal headaches: They may occur with any procedures in the area of the spine.       7.  Persistent CSF (Cerebro-Spinal Fluid) leakage: This is a rare problem, but may occur  with prolonged intrathecal or epidural catheters either due to the formation of a fistulous  track or a dural tear.       8. Nerve damage: By working so close to the spinal cord, there is always a possibility of  nerve damage, which could be as serious as a permanent spinal cord injury with  paralysis.       9. Death:  Although rare, severe deadly allergic reactions known as Anaphylactic  reaction can occur to any of the medications used.      10. Worsening of the symptoms:  We can always make thing worse.  What are the chances of something like this happening? Chances of any of this occuring are extremely low.  By statistics, you have more of a chance of getting killed in a  motor vehicle accident: while driving to the hospital than any of the above occurring .  Nevertheless, you should be aware that they are possibilities.  In general, it is similar to taking a shower.  Everybody knows that you can slip, hit your head and get killed.  Does that mean that you should not shower again?  Nevertheless always keep in mind that statistics do not mean anything if you happen to be on the wrong side of them.  Even if a procedure has a 1 (one) in a 1,000,000 (million) chance of going wrong, it you happen to be that one..Also, keep in mind that by statistics, you have more of a chance of having something go wrong when taking medications.  Who should not have this procedure? If you are on a blood thinning medication (e.g. Coumadin, Plavix, see list of Blood Thinners), or if you have an active infection going on, you should not have the procedure.  If you are taking any blood thinners, please inform your physician.  How should I prepare for this procedure? Do not eat or drink anything at least six hours prior to the procedure. Bring a driver with you .  It cannot be a taxi. Come accompanied by an adult that can drive you back, and that is strong enough to help you if your legs get weak or numb from the local anesthetic. Take all of your medicines the morning of the procedure with just enough water to swallow them. If you have diabetes, make sure that you are scheduled to have your procedure done first thing in the morning, whenever possible. If you have diabetes, take only half of your insulin  dose and notify our nurse that you have done so as soon as you arrive at the clinic. If you are diabetic, but only take blood sugar pills (oral hypoglycemic), then do not take them on the morning of your procedure.  You may take them after you have had the procedure. Do not take aspirin or any aspirin-containing medications, at least eleven (11) days prior to the procedure.  They may prolong  bleeding. Wear loose fitting clothing that may be easy to take off and that you would not mind if it got stained with Betadine or blood. Do not wear any jewelry or perfume Remove any nail coloring.  It will interfere with some of our monitoring equipment.  NOTE: Remember that this is not meant to be interpreted as a complete list of all possible complications.  Unforeseen problems may occur.  BLOOD THINNERS The following drugs contain aspirin or other products, which can cause increased  bleeding during surgery and should not be taken for 2 weeks prior to and 1 week after surgery.  If you should need take something for relief of minor pain, you may take acetaminophen  which is found in Tylenol ,m Datril, Anacin-3 and Panadol. It is not blood thinner. The products listed below are.  Do not take any of the products listed below in addition to any listed on your instruction sheet.  A.P.C or A.P.C with Codeine Codeine Phosphate Capsules #3 Ibuprofen Ridaura  ABC compound Congesprin Imuran rimadil  Advil Cope Indocin Robaxisal  Alka-Seltzer Effervescent Pain Reliever and Antacid Coricidin or Coricidin-D  Indomethacin Rufen  Alka-Seltzer plus Cold Medicine Cosprin Ketoprofen S-A-C Tablets  Anacin Analgesic Tablets or Capsules Coumadin Korlgesic Salflex  Anacin Extra Strength Analgesic tablets or capsules CP-2 Tablets Lanoril Salicylate  Anaprox  Cuprimine Capsules Levenox Salocol  Anexsia-D Dalteparin Magan Salsalate  Anodynos Darvon compound Magnesium  Salicylate Sine-off  Ansaid Dasin Capsules Magsal Sodium Salicylate  Anturane Depen Capsules Marnal Soma  APF Arthritis pain formula Dewitt's Pills Measurin Stanback  Argesic Dia-Gesic Meclofenamic Sulfinpyrazone  Arthritis Bayer Timed Release Aspirin Diclofenac Meclomen Sulindac  Arthritis pain formula Anacin Dicumarol Medipren Supac  Analgesic (Safety coated) Arthralgen Diffunasal Mefanamic Suprofen  Arthritis Strength Bufferin Dihydrocodeine Mepro  Compound Suprol  Arthropan liquid Dopirydamole Methcarbomol with Aspirin Synalgos  ASA tablets/Enseals Disalcid Micrainin Tagament  Ascriptin Doan's Midol Talwin  Ascriptin A/D Dolene Mobidin Tanderil  Ascriptin Extra Strength Dolobid Moblgesic Ticlid  Ascriptin with Codeine Doloprin or Doloprin with Codeine Momentum Tolectin  Asperbuf Duoprin Mono-gesic Trendar  Aspergum Duradyne Motrin or Motrin IB Triminicin  Aspirin plain, buffered or enteric coated Durasal Myochrisine Trigesic  Aspirin Suppositories Easprin Nalfon Trillsate  Aspirin with Codeine Ecotrin Regular or Extra Strength Naprosyn  Uracel  Atromid-S Efficin Naproxen  Ursinus  Auranofin Capsules Elmiron Neocylate Vanquish  Axotal Emagrin Norgesic Verin  Azathioprine Empirin or Empirin with Codeine Normiflo Vitamin E  Azolid Emprazil Nuprin Voltaren  Bayer Aspirin plain, buffered or children's or timed BC Tablets or powders Encaprin Orgaran Warfarin Sodium  Buff-a-Comp Enoxaparin Orudis Zorpin  Buff-a-Comp with Codeine Equegesic Os-Cal-Gesic   Buffaprin Excedrin plain, buffered or Extra Strength Oxalid   Bufferin Arthritis Strength Feldene Oxphenbutazone   Bufferin plain or Extra Strength Feldene Capsules Oxycodone  with Aspirin   Bufferin with Codeine Fenoprofen Fenoprofen Pabalate or Pabalate-SF   Buffets II Flogesic Panagesic   Buffinol plain or Extra Strength Florinal or Florinal with Codeine Panwarfarin   Buf-Tabs Flurbiprofen Penicillamine   Butalbital  Compound Four-way cold tablets Penicillin   Butazolidin Fragmin Pepto-Bismol   Carbenicillin Geminisyn Percodan   Carna Arthritis Reliever Geopen Persantine   Carprofen Gold's salt Persistin   Chloramphenicol Goody's Phenylbutazone   Chloromycetin Haltrain Piroxlcam   Clmetidine heparin Plaquenil   Cllnoril Hyco-pap Ponstel   Clofibrate Hydroxy chloroquine Propoxyphen         Before stopping any of these medications, be sure to consult the physician who ordered them.   Some, such as Coumadin (Warfarin) are ordered to prevent or treat serious conditions such as deep thrombosis, pumonary embolisms, and other heart problems.  The amount of time that you may need off of the medication may also vary with the medication and the reason for which you were taking it.  If you are taking any of these medications, please make sure you notify your pain physician before you undergo any procedures.         Occipital Nerve Block Patient Information  Description: The occipital nerves originate in the cervical (neck)  spinal cord and travel upward through muscle and tissue to supply sensation to the back of the head and top of the scalp.  In addition, the nerves control some of the muscles of the scalp.  Occipital neuralgia is an irritation of these nerves which can cause headaches, numbness of the scalp, and neck discomfort.     The occipital nerve block will interrupt nerve transmission through these nerves and can relieve pain and spasm.  The block consists of insertion of a small needle under the skin in the back of the head to deposit local anesthetic (numbing medicine) and/or steroids around the nerve.  The entire block usually lasts less than 5 minutes.  Conditions which may be treated by occipital blocks:  Muscular pain and spasm of the scalp Nerve irritation, back of the head Headaches Upper neck pain  Preparation for the injection:  Do not eat any solid food or dairy products within 8 hours of your appointment. You may drink clear liquids up to 3 hours before appointment.  Clear liquids include water, black coffee, juice or soda.  No milk or cream please. You may take your regular medication, including pain medications, with a sip of water before you appointment.  Diabetics should hold regular insulin  (if taken separately) and take 1/2 normal NPH dose the morning of the procedure.  Carry some sugar containing items with you to your appointment. A driver must  accompany you and be prepared to drive you home after your procedure. Bring all your current medications with you. An IV may be inserted and sedation may be given at the discretion of the physician. A blood pressure cuff, EKG, and other monitors will often be applied during the procedure.  Some patients may need to have extra oxygen administered for a short period. You will be asked to provide medical information, including your allergies and medications, prior to the procedure.  We must know immediately if you are taking blood thinners (like Coumadin/Warfarin) or if you are allergic to IV iodine contrast (dye).  We must know if you could possible be pregnant.  Do not wear a high collared shirt or turtleneck.  Tie long hair up in the back if possible.  Possible side-effects:  Bleeding from needle site Infection (rare, may require surgery) Nerve injury (rare) Hair on back of neck can be tinged with iodine scrub (this will wash out) Light-headedness (temporary) Pain at injection site (several days) Decreased blood pressure (rare, temporary) Seizure (very rare)  Call if you experience:  Hives or difficulty breathing ( go to the emergency room) Inflammation or drainage at the injection site(s)  Please note:  Although the local anesthetic injected can often make your painful muscles or headache feel good for several hours after the injection, the pain may return.  It takes 3-7 days for steroids to work.  You may not notice any pain relief for at least one week.  If effective, we will often do a series of injections spaced 3-6 weeks apart to maximally decrease your pain.  If you have any questions, please call [phone] [facility]

## 2024-04-07 NOTE — Progress Notes (Signed)
 PROVIDER NOTE: Interpretation of information contained herein should be left to medically-trained personnel. Specific patient instructions are provided elsewhere under Patient Instructions section of medical record. This document was created in part using AI and STT-dictation technology, any transcriptional errors that may result from this process are unintentional.  Patient: Anita Gibbs  Service: E/M   PCP: Gretel App, NP  DOB: September 22, 1970  DOS: 04/07/2024  Provider: Emmy MARLA Blanch, NP  MRN: [id number]  Delivery: Face-to-face  Specialty: Interventional Pain Management  Type: Established Patient  Setting: Ambulatory outpatient facility  Specialty designation: 09  Referring Prov.: Gretel App, NP  Location: Outpatient office facility       History of present illness (HPI) Anita Gibbs, a 53 y.o. year old female, is here today because of her Bilateral occipital neuralgia [M54.81]. Anita Gibbs primary complain today is Back Pain and Neck Pain  Pertinent problems: Anita Gibbs has Bilateral occipital neuralgia; Lumbar pain; Morbid obesity (BMI of 45.0-49.9, adult) (HCC); and Chronic pain syndrome on their pertinent problem list  Pain Assessment: Severity of Chronic pain is reported as a 10-Worst pain ever/10. Location: Back Lower/down left leg to left toes; also around right side to righ lower abdomen. Onset: More than a month ago. Quality: Aching, Burning, Constant, Throbbing, Stabbing, Shooting, Discomfort. Timing: Constant. Modifying factor(s): heating pad, meds help sometimes. Vitals:  height is 5' 3 (1.6 m) and weight is 238 lb (108 kg). Her temperature is 97.4 F (36.3 C) (abnormal). Her blood pressure is 135/75 and her pulse is 58 (abnormal). Her respiration is 16 and oxygen saturation is 100%.  BMI: Estimated body mass index is 42.16 kg/m as calculated from the following:   Height as of this encounter: 5' 3 (1.6 m).   Weight as of this encounter: 238 lb (108 kg).  Last  encounter: 03/08/2024. Last procedure: Visit date not found.  Reason for encounter: medication management. No change in medical history since last visit.  Patient's pain is at baseline.  Patient continues multimodal pain regimen as prescribed.  States that it provides pain relief and improvement in functional status.   Pharmacotherapy Assessment   Butrans  patch 15 mcg/hr 1 patch onto the skin once a week  Methocarbamol  (Robaxin ) 750 mg tablet every 8 hours as needed for muscle spasm Pregabalin  (Lyrica ) 100 mg capsule 3 times daily for neuropathic pain [place] PMP: PDMP reviewed during this encounter.       Pharmacotherapy: No side-effects or adverse reactions reported. Compliance: No problems identified. Effectiveness: Clinically acceptable.  Dayna Pulling, RN  04/07/2024  8:20 AM  Sign when Signing Visit Safety precautions to be maintained throughout the outpatient stay will include: orient to surroundings, keep bed in low position, maintain call bell within reach at all times, provide assistance with transfer out of bed and ambulation.     UDS:  Summary  Date Value Ref Range Status  02/01/2020 Note  Final    Comment:    ==================================================================== [facility] Select 13 (MW) ==================================================================== Test                             Result       Flag       Units Drug Present not Declared for Prescription Verification   Carboxy-THC                    20           UNEXPECTED ng/mg creat  Carboxy-THC is a metabolite of tetrahydrocannabinol (THC). Source of    THC is most commonly herbal marijuana or marijuana-based products,    but THC is also present in a scheduled prescription medication.    Trace amounts of THC can be present in hemp and cannabidiol (CBD)    products. This test is not intended to distinguish between delta-9-    tetrahydrocannabinol, the predominant form of THC in most herbal or     marijuana-based products, and delta-8-tetrahydrocannabinol. Drug Absent but Declared for Prescription Verification   Oxycodone                       Not Detected UNEXPECTED ng/mg creat ==================================================================== Test                      Result    Flag   Units      Ref Range   Creatinine              230              mg/dL      >=79 ==================================================================== Declared Medications:  The flagging and interpretation on this report are based on the  following declared medications.  Unexpected results may arise from  inaccuracies in the declared medications.  **Note: The testing scope of this panel includes these medications:  Oxycodone  (Roxicodone )  **Note: The testing scope of this panel does not include the  following reported medications:  Albuterol  (Ventolin  HFA)  Amlodipine  (Norvasc )  Atorvastatin  (Lipitor)  Cetirizine   Diphenhydramine (Benadryl)  Epinephrine  (EpiPen )  Fluticasone  (Flonase )  Gabapentin  (Neurontin )  Hydrochlorothiazide   Losartan  (Cozaar )  Meloxicam  (Mobic )  Methocarbamol  (Robaxin )  Montelukast  (Singulair )  Norethindrone (Aygestin)  Pantoprazole  (Protonix )  Vitamin D3 ==================================================================== For clinical consultation, please call [phone]. ====================================================================     No results found for: CBDTHCR No results found for: D8THCCBX No results found for: D9THCCBX  ROS  Constitutional: Denies any fever or chills Gastrointestinal: No reported hemesis, hematochezia, vomiting, or acute GI distress Musculoskeletal: Back pain, neck pain Neurological: No reported episodes of acute onset apraxia, aphasia, dysarthria, agnosia, amnesia, paralysis, loss of coordination, or loss of consciousness  Medication Review  Cyanocobalamin , DULoxetine , EPINEPHrine , Melatonin, Vitamin D -3, albuterol ,  amLODipine , atorvastatin , buprenorphine , busPIRone , butalbital -acetaminophen -caffeine , cetirizine , dicyclomine , fluticasone , hydrOXYzine , hydrochlorothiazide , hydrocortisone , ibuprofen, losartan , methocarbamol , montelukast , pantoprazole , polyethylene glycol powder, potassium chloride  SA, pregabalin , saccharomyces boulardii, tirzepatide , traZODone , and triamcinolone  cream  History Review  Allergy : Anita Gibbs is allergic to other, pineapple, pineapple extract, shellfish allergy , strawberry (diagnostic), and tomato. Drug: Anita Gibbs  reports no history of drug use. Alcohol:  reports current alcohol use of about 2.0 standard drinks of alcohol per week. Tobacco:  reports that she has never smoked. She has never used smokeless tobacco. Social: Anita Gibbs  reports that she has never smoked. She has never used smokeless tobacco. She reports current alcohol use of about 2.0 standard drinks of alcohol per week. She reports that she does not use drugs. 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), Adrenal nodule (HCC) (06/20/2020), Allergy , ANA positive (02/01/2021), Anxiety and depression (01/29/2021), Arthritis, Back pain with history of spinal surgery (01/29/2021), Bilateral carpal tunnel syndrome (01/29/2021), Bilateral leg edema (01/29/2021), BMI 40.0-44.9, adult (HCC) (04/30/2022), Carpal boss, right, Carpal tunnel syndrome, bilateral, 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) (09/27/2018), Cervical facet hypertrophy (C3-T1) (  09/27/2018), Cervical facet joint syndrome (Bilateral) (L>R) (09/27/2018), 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-5, C5-6, and C6-7) (09/27/2018), Cervical myelopathy (HCC) (07/01/2017), Cervicalgia (Primary Area of Pain) (Bilateral) (L>R)  (09/27/2018), Chronic gout of foot (Left) (05/31/2018), Chronic low back pain (Tertiary Area of Pain) (Bilateral) (R>L) w/ sciatica (Bilateral) (09/07/2018), Chronic low back pain (Tertiary Area of Pain) (Bilateral) (R>L) w/o sciatica (07/26/2018), Chronic lower extremity pain (Fourth Area of Pain) (Bilateral) (R>L) (09/07/2018), Chronic musculoskeletal pain (10/27/2018), Chronic neck pain (Bilateral) w/ history of cervical spinal surgery (09/27/2018), Chronic neck pain (Primary Area of Pain) (Bilateral) (L>R) (09/07/2018), Chronic sacroiliac joint dysfunction (Bilateral) (09/27/2018), Chronic sacroiliac joint pain (Right) (09/07/2018), Chronic upper extremity pain (Secondary Area of Pain) (Bilateral) (R>L) (09/27/2018), Disorder of skeletal system (09/07/2018), E. coli UTI (urinary tract infection) (06/20/2020), Elevated sed rate (09/08/2018), Elevated serum creatinine (06/20/2020), Excessive daytime sleepiness (03/06/2020), Female pelvic pain (09/05/2020), GERD (gastroesophageal reflux disease), Headache, History of allergy  to shellfish (09/28/2018), History of fusion of cervical spine (ACDF C4-C7) (09/27/2018), History of illicit drug use (03/07/2020), Hives (09/05/2020), Hives (09/05/2020), Hyperkalemia (05/31/2018), Hyperlipidemia (04/30/2022), Hypertension, Large breasts (05/30/2020), Left hip pain (07/21/2022), Lumbar facet arthropathy (04/20/2019), Lumbar radiculitis (L5 dermatome) (Right) (04/17/2020), Multiple environmental allergies (01/17/2020), Multiple food allergies (03/07/2020), Neurogenic pain (10/27/2018), Numbness and tingling of both feet (05/31/2018), 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), Other intervertebral disc degeneration, 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), Pharmacologic therapy (09/07/2018), Plantar fasciitis, bilateral (03/04/2021), Polyneuropathy (01/29/2021), Prediabetes (10/10/2019), Problems influencing health status (09/07/2018), Rash (08/08/2019), Seasonal allergic  rhinitis due to pollen (11/29/2018), Seasonal allergies (03/06/2020), Seasonal asthma (11/29/2018), Sjogren's disease (HCC), Sleep apnea, Somatic dysfunction of sacroiliac joint (Bilateral) (09/27/2018), Spondylosis without myelopathy or radiculopathy, cervical region (03/22/2019), Spondylosis without myelopathy or radiculopathy, lumbosacral region (05/19/2019), Spondylosis, cervical, 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 Surgical: Anita Gibbs  has a past surgical history that includes Knee arthroscopy (Right); Anterior cervical decomp/discectomy fusion (N/A, 07/01/2017); Abdominal hysterectomy (10/17/2020); and Carpal tunnel release (Bilateral). Family: family history includes [name] in her mother; Arthritis in her father, mother, and sister; Breast cancer in her maternal aunt and paternal aunt; COPD in her brother and son; Cancer in her brother; Depression in her son; Diabetes in her mother; Early death in her brother; Hearing loss in her father; Hyperlipidemia in her mother; Hypertension in her father, mother, and sister; Kidney disease in her father; Lymphoma in her father; Post-traumatic stress disorder in her brother and son; Sleep apnea in her daughter.  Laboratory Chemistry Profile   Renal Lab Results  Component Value Date   BUN 16 03/16/2024   CREATININE 0.95 03/16/2024   BCR NOT APPLICABLE 02/13/2021   GFR 69.78 03/16/2024   GFRAA 76 09/07/2018   GFRNONAA >60 11/18/2023    Hepatic Lab Results  Component Value Date   AST 12 (L) 11/18/2023   ALT 12 11/18/2023   ALBUMIN 3.9 11/18/2023   ALKPHOS 45 11/18/2023   LIPASE 32.0 06/30/2022    Electrolytes Lab Results  Component Value Date   NA 138 03/16/2024   K 3.7 03/16/2024   CL 104 03/16/2024   CALCIUM  9.3 03/16/2024   MG 2.2 09/07/2018    Bone Lab Results  Component Value Date   VD25OH 29.35 (L) 04/08/2023   25OHVITD1 8.1 (L) 09/07/2018   25OHVITD2 <1.0 09/07/2018   25OHVITD3 8.1 09/07/2018     Inflammation (CRP: Acute Phase) (ESR: Chronic Phase) Lab Results  Component Value Date   CRP <1.0 07/22/2023   ESRSEDRATE 57 (H) 07/22/2023  Note: Above Lab results reviewed.  Recent Imaging Review  NCV with EMG(electromyography) Tobie Tonita POUR, DO     03/10/2024 12:37 PM  [facility]  [address] [place], [address]  [place], [place] [address] Tel: [phone] Fax: [phone] Test Date:  03/10/2024  Patient: Dejanira Pamintuan DOB: 03/23/1971 Physician: Tonita Tobie, DO   Sex: Female Height: 5' 3 Ref Phys: Glade Boys, DEVONNA  ID#: [id number]   Technician:    History: This is a 53 year old female s/p C4-C7 ACDF (2018) referred for  evaluation of bilateral arm pain and paresthesias.   NCV & EMG Findings: Extensive electrodiagnostic testing of the right upper extremity  and additional studies of the left shows:  Bilateral median, ulnar, and mixed palmar sensory responses are  within normal limits. Bilateral median and ulnar motor responses are within normal  limits. There is no evidence of active or chronic motor axonal loss  changes affecting any of the tested muscles.  Motor unit  configuration and recruitment pattern is within normal limits.  Impression: This is a normal study of the upper extremities.  In particular,  there is no evidence of carpal tunnel syndrome or a cervical  radiculopathy.  ___________________________ Tonita Tobie, DO  Nerve Conduction Studies   Stim Site NR Peak (ms) Norm Peak (ms) O-P Amp (V) Norm O-P Amp  Left Median Anti Sensory (2nd Digit)  32 C  Wrist    2.9 <3.6 35.9 >15  Right Median Anti Sensory (2nd Digit)  32 C  Wrist    3.2 <3.6 29.0 >15  Left Ulnar Anti Sensory (5th Digit)  32 C  Wrist    2.8 <3.1 37.2 >10  Right Ulnar Anti Sensory (5th Digit)  32 C  Wrist    2.9 <3.1 39.1 >10    Stim Site NR Onset (ms) Norm Onset (ms) O-P Amp (mV) Norm O-P  Amp Site1 Site2 Delta-0 (ms) Dist (cm) Vel (m/s) Norm Vel (m/s)   Left Median Motor (Abd Poll Brev)  32 C  Wrist    2.9 <4.0 11.7 >6 Elbow Wrist 4.9 28.0 57 >50  Elbow    7.8  11.6         Right Median Motor (Abd Poll Brev)  32 C  Wrist    3.2 <4.0 11.9 >6 Elbow Wrist 4.6 29.0 63 >50  Elbow    7.8  11.0         Left Ulnar Motor (Abd Dig Minimi)  32 C  Wrist    2.7 <3.1 9.3 >7 B Elbow Wrist 3.9 20.0 51 >50  B Elbow    6.6  9.2  A Elbow B Elbow 1.8 10.0 56 >50  A Elbow    8.4  8.9         Right Ulnar Motor (Abd Dig Minimi)  32 C  Wrist    2.6 <3.1 9.1 >7 B Elbow Wrist 4.2 23.0 55 >50  B Elbow    6.8  8.7  A Elbow B Elbow 1.8 10.0 56 >50  A Elbow    8.6  8.2           Stim Site NR Peak (ms) Norm Peak (ms) P-T Amp (V) Site1 Site2  Delta-P (ms) Norm Delta (ms)  Left Median/Ulnar Palm Comparison (Wrist - 8cm)  32 C  Median Palm    1.5 <2.2 49.6 Median Palm Ulnar Palm 0.2   Ulnar Palm    1.7 <2.2 11.6      Right Median/Ulnar  Palm Comparison (Wrist - 8cm)  32 C  Median Palm    1.8 <2.2 45.9 Median Palm Ulnar Palm 0.1   Ulnar Palm    1.7 <2.2 13.0       Electromyography   Side Muscle Ins.Act Fibs Fasc Recrt Amp Dur Poly Activation  Comment  Right 1stDorInt Nml Nml Nml Nml Nml Nml Nml Nml N/A  Right PronatorTeres Nml Nml Nml Nml Nml Nml Nml Nml N/A  Right Biceps Nml Nml Nml Nml Nml Nml Nml Nml N/A  Right Triceps Nml Nml Nml Nml Nml Nml Nml Nml N/A  Right Deltoid Nml Nml Nml Nml Nml Nml Nml Nml N/A  Left 1stDorInt Nml Nml Nml Nml Nml Nml Nml Nml N/A  Left PronatorTeres Nml Nml Nml Nml Nml Nml Nml Nml N/A  Left Biceps Nml Nml Nml Nml Nml Nml Nml Nml N/A  Left Triceps Nml Nml Nml Nml Nml Nml Nml Nml N/A  Left Deltoid Nml Nml Nml Nml Nml Nml Nml Nml N/A   Waveforms:                        Note: Reviewed        Physical Exam  Vitals: BP 135/75   Pulse (!) 58   Temp (!) 97.4 F (36.3 C)   Resp 16   Ht 5' 3 (1.6 m)   Wt 238 lb (108 kg)   LMP  (LMP Unknown)   SpO2 100%   BMI 42.16 kg/m  BMI: Estimated body mass index is 42.16  kg/m as calculated from the following:   Height as of this encounter: 5' 3 (1.6 m).   Weight as of this encounter: 238 lb (108 kg). Ideal: Ideal body weight: 52.4 kg (115 lb 8.3 oz) Adjusted ideal body weight: 74.6 kg (164 lb 8.2 oz) General appearance: Well nourished, well developed, and well hydrated. In no apparent acute distress Mental status: Alert, oriented x 3 (person, place, & time)       Respiratory: No evidence of acute respiratory distress Eyes: PERLA   Assessment   Diagnosis Status  1. Bilateral occipital neuralgia   2. Neurogenic pain   3. Chronic pain syndrome   4. Chronic musculoskeletal pain   5. Lumbar facet joint syndrome   6. Lumbar facet arthropathy    Controlled Controlled Controlled   Updated Problems: No problems updated.  Plan of Care  Problem-specific:  Assessment and Plan We will continue on Butrans  patch methocarbamol  (Robaxin ) for pain management.  Prescribing drug monitoring (PDMP) reviewed; findings consistent with the use of prescribed medication.  Scheduled in 30 days for medication management.    Anita Gibbs has a current medication list which includes the following long-term medication(s): albuterol , amlodipine , atorvastatin , cetirizine , dicyclomine , duloxetine , fluticasone , losartan , montelukast , pantoprazole , potassium chloride  sa, trazodone , methocarbamol , and pregabalin .  Pharmacotherapy (Medications Ordered): Meds ordered this encounter  Medications   pregabalin  (LYRICA ) 100 MG capsule    Sig: Take 1 capsule (100 mg total) by mouth 3 (three) times daily.    Dispense:  90 capsule    Refill:  2    Fill one day early if pharmacy is closed on scheduled refill date. May substitute for generic if available.   methocarbamol  (ROBAXIN ) 750 MG tablet    Sig: Take 1 tablet (750 mg total) by mouth every 8 (eight) hours as needed for muscle spasms.    Dispense:  90 tablet    Refill:  11  Fill one day early if pharmacy is closed on  scheduled refill date. May substitute for generic if available.   buprenorphine  (BUTRANS ) 15 MCG/HR    Sig: Place 1 patch onto the skin once a week for 28 days.    Dispense:  4 patch    Refill:  0    Chronic Pain: STOP Act (Not applicable) Fill 1 day early if closed on refill date. Avoid benzodiazepines within 8 hours of opioids   Orders:  No orders of the defined types were placed in this encounter.       Return in about 1 month (around 05/08/2024) for (F2F), (MM), Emmy Blanch NP.    Recent Visits Date Type Provider Dept  03/22/24 Office Visit Marcelino Nurse, MD [facility]  03/08/24 Office Visit Jeanpaul Biehl K, NP [facility]  02/01/24 Procedure visit Marcelino Nurse, MD [facility]  01/12/24 Office Visit Abigal Choung K, NP [facility]  Showing recent visits within past 90 days and meeting all other requirements Today's Visits Date Type Provider Dept  04/07/24 Office Visit Emelyn Roen K, NP [facility]  Showing today's visits and meeting all other requirements Future Appointments Date Type Provider Dept  04/13/24 Appointment Marcelino Nurse, MD [facility]  05/05/24 Appointment Brydan Downard K, NP [facility]  Showing future appointments within next 90 days and meeting all other requirements  I discussed the assessment and treatment plan with the patient. The patient was provided an opportunity to ask questions and all were answered. The patient agreed with the plan and demonstrated an understanding of the instructions.  Patient advised to call back or seek an in-person evaluation if the symptoms or condition worsens.  Duration of encounter: 30 minutes.  Total time on encounter, as per AMA guidelines included both the face-to-face and non-face-to-face time personally spent by the physician and/or other qualified health care professional(s) on the day of the encounter (includes time in activities that require the  physician or other qualified health care professional and does not include time in activities normally performed by clinical staff). Physician's time may include the following activities when performed: Preparing to see the patient (e.g., pre-charting review of records, searching for previously ordered imaging, lab work, and nerve conduction tests) Review of prior analgesic pharmacotherapies. Reviewing PMP Interpreting ordered tests (e.g., lab work, imaging, nerve conduction tests) Performing post-procedure evaluations, including interpretation of diagnostic procedures Obtaining and/or reviewing separately obtained history Performing a medically appropriate examination and/or evaluation Counseling and educating the patient/family/caregiver Ordering medications, tests, or procedures Referring and communicating with other health care professionals (when not separately reported) Documenting clinical information in the electronic or other health record Independently interpreting results (not separately reported) and communicating results to the patient/ family/caregiver Care coordination (not separately reported)  Note by: Sherill Wegener K Taylynn Easton, NP (TTS and AI technology used. I apologize for any typographical errors that were not detected and corrected.) Date: 04/07/2024; Time: 9:05 AM

## 2024-04-08 ENCOUNTER — Ambulatory Visit (INDEPENDENT_AMBULATORY_CARE_PROVIDER_SITE_OTHER): Admitting: Clinical

## 2024-04-08 DIAGNOSIS — F410 Panic disorder [episodic paroxysmal anxiety] without agoraphobia: Secondary | ICD-10-CM

## 2024-04-08 DIAGNOSIS — F411 Generalized anxiety disorder: Secondary | ICD-10-CM

## 2024-04-08 DIAGNOSIS — F32A Depression, unspecified: Secondary | ICD-10-CM

## 2024-04-08 LAB — GENECONNECT MOLECULAR SCREEN: Genetic Analysis Overall Interpretation: NEGATIVE

## 2024-04-08 NOTE — Progress Notes (Signed)
   Doree Barthel, LCSW

## 2024-04-08 NOTE — Progress Notes (Signed)
 [facility] Counselor/Therapist Progress Note  Patient ID: Anita Gibbs, MRN: [id number],    Date: 04/08/2024  Time Spent: 12:35pm - 1:16pm : 41 minutes   Treatment Type: Individual Therapy  Reported Symptoms: difficulty staying asleep and returning to sleep  Mental Status Exam: Appearance:  Neat and Well Groomed     Behavior: Appropriate  Motor: Normal  Speech/Language:  Clear and Coherent and Normal Rate  Affect: Appropriate  Mood: normal  Thought process: normal  Thought content:   WNL  Sensory/Perceptual disturbances:   WNL  Orientation: oriented to person, place, time/date, and situation  Attention: Good  Concentration: Good  Memory: WNL  Fund of knowledge:  Good  Insight:   Good  Judgment:  Good  Impulse Control: Good   Risk Assessment: Danger to Self:  No Patient denied current suicidal ideation  Self-injurious Behavior: No Danger to Others: No Patient denied current homicidal ideation Duty to Warn:no Physical Aggression / Violence:No  Access to Firearms a concern: No  Gang Involvement:No   Subjective: Patient stated, its been going ok, it's been better in reference to events since last session. Patient stated, just in general, just talking with other people knowing other people are going through the same stuff I'm going through in reference to patient's participation in group therapy during IOP.  Patient reported patient attended group therapy Monday through Friday from 9am - 1pm during IOP. Patient stated, I was nervous at first in reference to participation in group therapy. Patient reported after several days of observing patient participated in group dialogue. Patient stated, my mood is a whole lot better than what it was before, I still have my mood swings I'm still delaying with disability. Patient reported patient's disability was denied and patient is awaiting a hearing. Patient stated, my mood is fine today. Patient reported patient  has been able to remain in patient's home and reported the individuals that purchased patient's home are allowing patient to remain in the home for a year. Patient reported letting everything go and trying not to stress in reference to contributing factors to changes in mood. Patient reported patient has been  using distraction, planting in flower bed, maintaining/pruning bonsai tree, started writing in a journal, and stated, channel myself not to get angry, look at the perspectives. Patient stated, It has helped with the anxiety too but more so with the anger. Patient provided verbal consent for clinician to release initial assessment and progress notes per records request.   Interventions: Cognitive Behavioral Therapy.  Clinician conducted session via caregility video from clinician's home office. Patient provided verbal consent to proceed with telehealth session and is aware of limitations of telephone or video visits. Patient participated in session from patient's home. Reviewed events since last session and assessed for changes. Discussed patient's experience in Intensive Outpatient program and the impact of the program. Explored changes in patient's mood and contributing factors to positive changes in mood. Discussed status of housing. Reviewed coping strategies patient has been utilizing in response to feelings of anger and anxiety. Discussed request of records from vocational rehabilitation and consent to release records.    Collaboration of Care: discussed record request and consent to release records   Diagnosis:  Panic disorder   Generalized anxiety disorder   Depression, unspecified depression type     Plan: Patient is to utilize [employer] Therapy, thought re-framing, behavioral activation, relaxation techniques, mindfulness and coping strategies to decrease symptoms associated with their diagnosis. Frequency: bi-weekly  Modality: individual  Long-term goal:    Reduce overall level, frequency, and intensity of the feelings of depression, anxiety and panic as evidenced by decreased panic attacks,fear of future panic attacks, lack of  concentration, difficulty falling asleep and staying asleep, muscle tension, feeling on edge, restlessness, and irritability from 7 days/week to 1 to 3 days/week per patient report for at least 3 consecutive months. Target Date: 12/03/24  Progress: progressing    Short-term goal:  Identify and access local resources to increase patient's financial stability Target Date: 06/04/24  Progress: progressing    Decrease feelings of panic/anxiety from 4 to 5 days per week to 1 to 2 days per week by implementing healthy coping strategies, such as, relaxation techniques and mindfulness exercises Target Date: 06/04/24  Progress: progressing    Identify, challenge, and replace negative thought patterns and negative self talk that contribute to feelings of depression and anxiety with positive thoughts, beliefs, and positive self talk per patient's report Target Date: 06/04/24  Progress: progressing    Increase patient's participation in activities patient enjoys from 1 time per week to 3 times per week  Target Date: 06/04/24  Progress: progressing            Darice Seats, LCSW

## 2024-04-11 ENCOUNTER — Telehealth: Payer: Self-pay

## 2024-04-11 DIAGNOSIS — G894 Chronic pain syndrome: Secondary | ICD-10-CM

## 2024-04-11 DIAGNOSIS — M792 Neuralgia and neuritis, unspecified: Secondary | ICD-10-CM

## 2024-04-11 MED ORDER — TRAMADOL HCL ER 100 MG PO TB24: 100.0000 mg | ORAL_TABLET | Freq: Every day | ORAL | 0 refills | Status: DC

## 2024-04-11 NOTE — Telephone Encounter (Signed)
 Tramadol  sent in. Counsel pt on risk of serotonin syndrome   Symptoms can appear within hours of taking too much serotonin.  Mild symptoms:Shivering or goosebumps,Sweating, Diarrhea, Feeling restless or anxious, Fast heartbeat  More serious symptoms:  Confusion or trouble thinking clearly Muscle stiffness or twitching Tremors High fever Seizures Irregular heartbeat Loss of consciousness  If mild symptoms, discontinue Tramadol . If more serious, then go to ED/urgent care  Requested Prescriptions   Signed Prescriptions Disp Refills   traMADol  (ULTRAM -ER) 100 MG 24 hr tablet 30 tablet 0    Sig: Take 1 tablet (100 mg total) by mouth daily.    Authorizing Provider: MARCELINO NURSE   Pmp reviewed

## 2024-04-11 NOTE — Telephone Encounter (Signed)
 Received Denial for Butran Patches. Patient  Must have tried 2 alternatives: Fentanyl  patch, Morphine  ER, Oxycontin , Tramadol  ER. Would you like to send in a different medication.

## 2024-04-12 DIAGNOSIS — Z419 Encounter for procedure for purposes other than remedying health state, unspecified: Secondary | ICD-10-CM | POA: Diagnosis not present

## 2024-04-13 ENCOUNTER — Encounter: Admitting: Nurse Practitioner

## 2024-04-13 ENCOUNTER — Ambulatory Visit
Attending: Student in an Organized Health Care Education/Training Program | Admitting: Student in an Organized Health Care Education/Training Program

## 2024-04-13 ENCOUNTER — Encounter: Payer: Self-pay | Admitting: Student in an Organized Health Care Education/Training Program

## 2024-04-13 DIAGNOSIS — G894 Chronic pain syndrome: Secondary | ICD-10-CM

## 2024-04-13 DIAGNOSIS — M5481 Occipital neuralgia: Secondary | ICD-10-CM

## 2024-04-13 MED ORDER — DEXAMETHASONE SODIUM PHOSPHATE 10 MG/ML IJ SOLN
INTRAMUSCULAR | Status: AC
  Filled 2024-04-13: qty 1

## 2024-04-13 MED ORDER — LIDOCAINE HCL 2 % IJ SOLN
INTRAMUSCULAR | Status: AC
  Filled 2024-04-13: qty 20

## 2024-04-13 MED ORDER — MIDAZOLAM HCL 2 MG/2ML IJ SOLN
0.5000 mg | Freq: Once | INTRAMUSCULAR | Status: AC
  Administered 2024-04-13 (×2): 2 mg via INTRAVENOUS

## 2024-04-13 MED ORDER — DEXAMETHASONE SODIUM PHOSPHATE 10 MG/ML IJ SOLN
10.0000 mg | Freq: Once | INTRAMUSCULAR | Status: AC
  Administered 2024-04-13 (×2): 10 mg

## 2024-04-13 MED ORDER — LACTATED RINGERS IV SOLN: Freq: Once | INTRAVENOUS | Status: AC

## 2024-04-13 MED ORDER — ROPIVACAINE HCL 2 MG/ML IJ SOLN
9.0000 mL | Freq: Once | INTRAMUSCULAR | Status: AC
  Administered 2024-04-13 (×2): 9 mL via PERINEURAL

## 2024-04-13 MED ORDER — MIDAZOLAM HCL 2 MG/2ML IJ SOLN
INTRAMUSCULAR | Status: AC
  Filled 2024-04-13: qty 2

## 2024-04-13 MED ORDER — ROPIVACAINE HCL 2 MG/ML IJ SOLN
INTRAMUSCULAR | Status: AC
  Filled 2024-04-13: qty 20

## 2024-04-13 NOTE — Progress Notes (Signed)
 PROVIDER NOTE: Interpretation of information contained herein should be left to medically-trained personnel. Specific patient instructions are provided elsewhere under Patient Instructions section of medical record. This document was created in part using STT-dictation technology, any transcriptional errors that may result from this process are unintentional.  Patient: Anita Gibbs Type: Established DOB: 02-Dec-1970 MRN: [id number] PCP: Gretel App, NP  Service: Procedure DOS: 04/13/2024 Setting: Ambulatory Location: Ambulatory outpatient facility Delivery: Face-to-face Provider: Wallie Sherry, MD Specialty: Interventional Pain Management Specialty designation: 09 Location: Outpatient facility Ref. Prov.: Sherry Wallie, MD       Interventional Therapy   Primary Reason for Visit: Interventional Pain Management Treatment. CC: Headache (Posterior )    Procedure:          Anesthesia, Analgesia, Anxiolysis:  Type: Diagnostic, Greater, Occipital Nerve Block  #1  Region: Posterolateral Cervical Level: Occipital Ridge   Laterality: Bilateral  Anesthesia: Local (1-2% Lidocaine )  Anxiolysis: None  Sedation: Minimal  Guidance: Fluoroscopy           Position: Prone   1. Bilateral occipital neuralgia   2. Chronic pain syndrome    NAS-11 Pain score:   Pre-procedure: 7 /10   Post-procedure: 2 /10     H&P (Pre-op Assessment):  Anita Gibbs is a 53 y.o. (year old), female patient, seen today for interventional treatment. She  has a past surgical history that includes Knee arthroscopy (Right); Anterior cervical decomp/discectomy fusion (N/A, 07/01/2017); Abdominal hysterectomy (10/17/2020); and Carpal tunnel release (Bilateral). Anita Gibbs has a current medication list which includes the following prescription(s): albuterol , amlodipine , atorvastatin , buspirone , butalbital -acetaminophen -caffeine , cetirizine , vitamin d -3, cyanocobalamin , dicyclomine , duloxetine , epinephrine , fluticasone ,  hydrochlorothiazide , hydrocortisone , hydroxyzine , ibuprofen, losartan , melatonin, methocarbamol , montelukast , pantoprazole , polyethylene glycol powder, potassium chloride  sa, pregabalin , saccharomyces boulardii, zepbound , tramadol , trazodone , and triamcinolone  cream, and the following Facility-Administered Medications: lactated ringers . Her primarily concern today is the Headache (Posterior )  Initial Vital Signs:  Pulse/HCG Rate: (!) 57ECG Heart Rate: 61 Temp: (!) 97.4 F (36.3 C) Resp: 16 BP: 119/83 SpO2: 100 %  BMI: Estimated body mass index is 42.16 kg/m as calculated from the following:   Height as of this encounter: 5' 3 (1.6 m).   Weight as of this encounter: 238 lb (108 kg).  Risk Assessment: Allergies: Reviewed. She is allergic to other, pineapple, pineapple extract, shellfish allergy , strawberry (diagnostic), and tomato.  Allergy  Precautions: None required Coagulopathies: Reviewed. None identified.  Blood-thinner therapy: None at this time Active Infection(s): Reviewed. None identified. Anita Gibbs is afebrile  Site Confirmation: Anita Gibbs was asked to confirm the procedure and laterality before marking the site Procedure checklist: Completed Consent: Before the procedure and under the influence of no sedative(s), amnesic(s), or anxiolytics, the patient was informed of the treatment options, risks and possible complications. To fulfill our ethical and legal obligations, as recommended by the American Medical Association's Code of Ethics, I have informed the patient of my clinical impression; the nature and purpose of the treatment or procedure; the risks, benefits, and possible complications of the intervention; the alternatives, including doing nothing; the risk(s) and benefit(s) of the alternative treatment(s) or procedure(s); and the risk(s) and benefit(s) of doing nothing. The patient was provided information about the general risks and possible complications associated with  the procedure. These may include, but are not limited to: failure to achieve desired goals, infection, bleeding, organ or nerve damage, allergic reactions, paralysis, and death. In addition, the patient was informed of those risks and complications associated to the procedure, such as failure to decrease pain;  infection; bleeding; organ or nerve damage with subsequent damage to sensory, motor, and/or autonomic systems, resulting in permanent pain, numbness, and/or weakness of one or several areas of the body; allergic reactions; (i.e.: anaphylactic reaction); and/or death. Furthermore, the patient was informed of those risks and complications associated with the medications. These include, but are not limited to: allergic reactions (i.e.: anaphylactic or anaphylactoid reaction(s)); adrenal axis suppression; blood sugar elevation that in diabetics may result in ketoacidosis or comma; water retention that in patients with history of congestive heart failure may result in shortness of breath, pulmonary edema, and decompensation with resultant heart failure; weight gain; swelling or edema; medication-induced neural toxicity; particulate matter embolism and blood vessel occlusion with resultant organ, and/or nervous system infarction; and/or aseptic necrosis of one or more joints. Finally, the patient was informed that Medicine is not an exact science; therefore, there is also the possibility of unforeseen or unpredictable risks and/or possible complications that may result in a catastrophic outcome. The patient indicated having understood very clearly. We have given the patient no guarantees and we have made no promises. Enough time was given to the patient to ask questions, all of which were answered to the patient's satisfaction. Anita Gibbs has indicated that she wanted to continue with the procedure. Attestation: I, the ordering provider, attest that I have discussed with the patient the benefits, risks,  side-effects, alternatives, likelihood of achieving goals, and potential problems during recovery for the procedure that I have provided informed consent. Date  Time: 04/13/2024  9:26 AM  Pre-Procedure Preparation:  Monitoring: As per clinic protocol. Respiration, ETCO2, SpO2, BP, heart rate and rhythm monitor placed and checked for adequate function Safety Precautions: Patient was assessed for positional comfort and pressure points before starting the procedure. Time-out: I initiated and conducted the Time-out before starting the procedure, as per protocol. The patient was asked to participate by confirming the accuracy of the Time Out information. Verification of the correct person, site, and procedure were performed and confirmed by me, the nursing staff, and the patient. Time-out conducted as per Joint Commission's Universal Protocol (UP.01.01.01). Time: 1002 Start Time: 1002 hrs.  Description of Procedure:          Target Area: Area medial to the occipital artery at the level of the superior nuchal ridge Approach: Posterior approach Area Prepped: Entire Posterior Occipital Region ChloraPrep (2% chlorhexidine  gluconate and 70% isopropyl alcohol) Safety Precautions: Aspiration looking for blood return was conducted prior to all injections. At no point did we inject any substances, as a needle was being advanced. No attempts were made at seeking any paresthesias. Safe injection practices and needle disposal techniques used. Medications properly checked for expiration dates. SDV (single dose vial) medications used. Description of the Procedure: Protocol guidelines were followed. The target area was identified and the area prepped in the usual manner. Skin & deeper tissues infiltrated with local anesthetic. Appropriate amount of time allowed to pass for local anesthetics to take effect. The procedure needles were then advanced to the target area. Proper needle placement secured. Negative  aspiration confirmed. Solution injected in intermittent fashion, asking for systemic symptoms every 0.5cc of injectate. The needles were then removed and the area cleansed, making sure to leave some of the prepping solution back to take advantage of its long term bactericidal properties.  Vitals:   04/13/24 0957 04/13/24 1002 04/13/24 1007 04/13/24 1013  BP: 134/83 129/83 131/89 121/75  Pulse:      Resp: 15 16 15 16   Temp:  TempSrc:      SpO2: 100% 100% 100% 97%  Weight:      Height:        Start Time: 1002 hrs. End Time: 1007 hrs. Materials:  Needle(s) Type: Regular needle Gauge: 22G Length: 1.5-in Medication(s): Please see orders for medications and dosing details. 5  cc of nerve block solution injected for the left GON and 5 cc of nerve block solution injected for the right GON   Antibiotic Prophylaxis:   Anti-infectives (From admission, onward)    None      Indication(s): None identified  Post-operative Assessment:  Post-procedure Vital Signs:  Pulse/HCG Rate: (!) 57(!) 59 Temp: (!) 97.4 F (36.3 C) Resp: 16 BP: 121/75 SpO2: 97 %  EBL: None  Complications: No immediate post-treatment complications observed by team, or reported by patient.  Note: The patient tolerated the entire procedure well. A repeat set of vitals were taken after the procedure and the patient was kept under observation following institutional policy, for this type of procedure. Post-procedural neurological assessment was performed, showing return to baseline, prior to discharge. The patient was provided with post-procedure discharge instructions, including a section on how to identify potential problems. Should any problems arise concerning this procedure, the patient was given instructions to immediately contact us , at any time, without hesitation. In any case, we plan to contact the patient by telephone for a follow-up status report regarding this interventional procedure.  Comments:  No  additional relevant information.  Plan of Care (POC)  Orders:  No orders of the defined types were placed in this encounter.    Medications ordered for procedure: Meds ordered this encounter  Medications   dexamethasone  (DECADRON ) injection 10 mg   ropivacaine  (PF) 2 mg/mL (0.2%) (NAROPIN ) injection 9 mL   lactated ringers  infusion   midazolam  (VERSED ) injection 0.5-2 mg    Make sure Flumazenil is available in the pyxis when using this medication. If oversedation occurs, administer 0.2 mg IV over 15 sec. If after 45 sec no response, administer 0.2 mg again over 1 min; may repeat at 1 min intervals; not to exceed 4 doses (1 mg)   Medications administered: We administered dexamethasone , ropivacaine  (PF) 2 mg/mL (0.2%), lactated ringers , and midazolam .  See the medical record for exact dosing, route, and time of administration.    Follow-up plan:   Return for Keep sch. appt.     Recent Visits Date Type Provider Dept  04/07/24 Office Visit Patel, Seema K, NP [facility]  03/22/24 Office Visit Marcelino Nurse, MD [facility]  03/08/24 Office Visit Patel, Seema K, NP [facility]  02/01/24 Procedure visit Marcelino Nurse, MD [facility]  Showing recent visits within past 90 days and meeting all other requirements Today's Visits Date Type Provider Dept  04/13/24 Procedure visit Marcelino Nurse, MD [facility]  Showing today's visits and meeting all other requirements Future Appointments Date Type Provider Dept  05/05/24 Appointment Patel, Seema K, NP [facility]  Showing future appointments within next 90 days and meeting all other requirements   Disposition: Discharge home  Discharge (Date  Time): 04/13/2024; 1020 hrs.   Primary Care Physician: Gretel App, NP Location: [facility] Note by: Nurse Marcelino, MD (TTS technology used. I apologize for any typographical errors that were not detected  and corrected.) Date: 04/13/2024; Time: 10:19 AM  Disclaimer:  Medicine is not an Visual merchandiser. The only guarantee in medicine is that nothing is guaranteed. It is  important to note that the decision to proceed with this intervention was based on the information collected from the patient. The Data and conclusions were drawn from the patient's questionnaire, the interview, and the physical examination. Because the information was provided in large part by the patient, it cannot be guaranteed that it has not been purposely or unconsciously manipulated. Every effort has been made to obtain as much relevant data as possible for this evaluation. It is important to note that the conclusions that lead to this procedure are derived in large part from the available data. Always take into account that the treatment will also be dependent on availability of resources and existing treatment guidelines, considered by other Pain Management Practitioners as being common knowledge and practice, at the time of the intervention. For Medico-Legal purposes, it is also important to point out that variation in procedural techniques and pharmacological choices are the acceptable norm. The indications, contraindications, technique, and results of the above procedure should only be interpreted and judged by a Board-Certified Interventional Pain Specialist with extensive familiarity and expertise in the same exact procedure and technique.

## 2024-04-13 NOTE — Progress Notes (Signed)
 Safety precautions to be maintained throughout the outpatient stay will include: orient to surroundings, keep bed in low position, maintain call bell within reach at all times, provide assistance with transfer out of bed and ambulation.

## 2024-04-13 NOTE — Patient Instructions (Signed)

## 2024-04-14 ENCOUNTER — Telehealth: Payer: Self-pay | Admitting: *Deleted

## 2024-04-14 LAB — DEXAMETHASONE, BLOOD: Dexamethasone, Serum: 543 ng/dL

## 2024-04-14 LAB — CORTISOL: Cortisol, Plasma: 1.2 ug/dL — ABNORMAL LOW

## 2024-04-14 NOTE — Telephone Encounter (Signed)
 Post procedure call; voicemail left

## 2024-04-19 DIAGNOSIS — M47819 Spondylosis without myelopathy or radiculopathy, site unspecified: Secondary | ICD-10-CM | POA: Diagnosis not present

## 2024-04-21 ENCOUNTER — Ambulatory Visit: Admitting: Nurse Practitioner

## 2024-04-21 ENCOUNTER — Encounter: Payer: Self-pay | Admitting: Sleep Medicine

## 2024-04-22 ENCOUNTER — Ambulatory Visit: Admitting: Clinical

## 2024-04-22 ENCOUNTER — Encounter: Payer: Self-pay | Admitting: Nurse Practitioner

## 2024-04-22 ENCOUNTER — Other Ambulatory Visit: Payer: Self-pay | Admitting: Nurse Practitioner

## 2024-04-22 DIAGNOSIS — F411 Generalized anxiety disorder: Secondary | ICD-10-CM | POA: Diagnosis not present

## 2024-04-22 DIAGNOSIS — F410 Panic disorder [episodic paroxysmal anxiety] without agoraphobia: Secondary | ICD-10-CM | POA: Diagnosis not present

## 2024-04-22 DIAGNOSIS — M47819 Spondylosis without myelopathy or radiculopathy, site unspecified: Secondary | ICD-10-CM | POA: Diagnosis not present

## 2024-04-22 DIAGNOSIS — Z79899 Other long term (current) drug therapy: Secondary | ICD-10-CM | POA: Diagnosis not present

## 2024-04-22 DIAGNOSIS — L4050 Arthropathic psoriasis, unspecified: Secondary | ICD-10-CM | POA: Diagnosis not present

## 2024-04-22 DIAGNOSIS — G4733 Obstructive sleep apnea (adult) (pediatric): Secondary | ICD-10-CM

## 2024-04-22 DIAGNOSIS — M3500 Sicca syndrome, unspecified: Secondary | ICD-10-CM | POA: Diagnosis not present

## 2024-04-22 DIAGNOSIS — F32A Depression, unspecified: Secondary | ICD-10-CM

## 2024-04-22 MED ORDER — ZEPBOUND 5 MG/0.5ML ~~LOC~~ SOAJ: 5.0000 mg | SUBCUTANEOUS | 0 refills | Status: DC

## 2024-04-22 NOTE — Progress Notes (Addendum)
 [facility] Counselor/Therapist Progress Note  Patient ID: RAMYA VANBERGEN, MRN: [id number],    Date: 04/22/2024  Time Spent: 1:33pm - 2:20pm : 47 minutes   Treatment Type: Individual Therapy  Reported Symptoms: recent feelings of anxiety/panic  Mental Status Exam: Appearance:  Neat and Well Groomed     Behavior: Appropriate  Motor: Normal  Speech/Language:  Clear and Coherent and Normal Rate  Affect: Appropriate  Mood: normal  Thought process: normal  Thought content:   WNL  Sensory/Perceptual disturbances:   Patient reported experiencing physical pain during today's session  Orientation: oriented to person, place, time/date, and situation  Attention: Good  Concentration: Good  Memory: WNL  Fund of knowledge:  Good  Insight:   Good  Judgment:  Good  Impulse Control: Good   Risk Assessment: Danger to Self:  No Patient denied current suicidal ideation  Self-injurious Behavior: No Danger to Others: No Patient denied current homicidal ideation Duty to Warn:no Physical Aggression / Violence:No  Access to Firearms a concern: No  Gang Involvement:No   Subjective: Patient stated, things been going good in response to events since last session. Patient reported a recent appointment with vocational rehabilitation and reported patient was advised patient was eligible for services. Patient stated,  I'm waking up every day and that's a positive in response to positive events since last session. Patient stated, I've been doing ok, I haven't had any breakdowns. Patient reported feelings of frustration in reference to disability process. Patient stated, my mood is fine, I'm not letting it get my down. Patient reported financial stressors due to lack of income.Patient reported patient recently qualified for food assistance.  Patient stated,  the anxiety with the pills and stuff (coping strategies) has been helping a great amount. Patient reported experiencing anxiety  last week. Patient reported patient continues to write in patient's journal and practice deep breathing exercises.  Patient previously experiencing anxiety daily and stated, its coming less and less in reference to frequency of anxiety. Patient stated, I might try the sports in response to grounding exercise of choosing a category and naming as many items as possible. Patient stated, I'm going to try it in response to grounding exercises. Patient stated, I can see how when I'm in pain trying to think of something else see how that's working.   Interventions: Cognitive Behavioral Therapy and task centered. Clinician conducted session via caregility video from clinician's home office. Patient provided verbal consent to proceed with telehealth session and is aware of limitations of telephone or video visits. Patient participated in session from patient's home. Reviewed events since last session and assessed for changes. Explored positive events since last session. Assessed intensity and frequency of anxiety since last session. Discussed financial stressors. Clinician utilized a task centered approach to assist patient in identifying financial resources and provided information, such as, utility assistance, internet/phone assistance, gas assistance for transportation to medical appointments. Reviewed coping strategies patient has been practicing/implementing. Provided psycho education related to grounding exercises. Clinician requested for homework patient practice ground exercises.    Collaboration of Care: not required at this time   Diagnosis:  Panic disorder   Generalized anxiety disorder   Depression, unspecified depression type     Plan: Patient is to utilize [employer] Therapy, thought re-framing, behavioral activation, relaxation techniques, mindfulness and coping strategies to decrease symptoms associated with their diagnosis. Frequency: bi-weekly  Modality: individual       Long-term goal:   Reduce overall level, frequency, and intensity of the  feelings of depression, anxiety and panic as evidenced by decreased panic attacks,fear of future panic attacks, lack of  concentration, difficulty falling asleep and staying asleep, muscle tension, feeling on edge, restlessness, and irritability from 7 days/week to 1 to 3 days/week per patient report for at least 3 consecutive months. Target Date: 12/03/24  Progress: progressing    Short-term goal:  Identify and access local resources to increase patient's financial stability Target Date: 06/04/24  Progress: progressing    Decrease feelings of panic/anxiety from 4 to 5 days per week to 1 to 2 days per week by implementing healthy coping strategies, such as, relaxation techniques and mindfulness exercises Target Date: 06/04/24  Progress: progressing    Identify, challenge, and replace negative thought patterns and negative self talk that contribute to feelings of depression and anxiety with positive thoughts, beliefs, and positive self talk per patient's report Target Date: 06/04/24  Progress: progressing    Increase patient's participation in activities patient enjoys from 1 time per week to 3 times per week  Target Date: 06/04/24  Progress: progressing         Darice Seats, LCSW

## 2024-04-22 NOTE — Progress Notes (Signed)
   Darice Seats, LCSW

## 2024-04-29 ENCOUNTER — Ambulatory Visit: Admitting: Nurse Practitioner

## 2024-05-05 ENCOUNTER — Telehealth: Payer: Self-pay | Admitting: *Deleted

## 2024-05-05 ENCOUNTER — Encounter: Payer: Self-pay | Admitting: Nurse Practitioner

## 2024-05-05 ENCOUNTER — Telehealth: Payer: Self-pay

## 2024-05-05 ENCOUNTER — Ambulatory Visit: Attending: Nurse Practitioner | Admitting: Nurse Practitioner

## 2024-05-05 ENCOUNTER — Telehealth (INDEPENDENT_AMBULATORY_CARE_PROVIDER_SITE_OTHER): Admitting: Nurse Practitioner

## 2024-05-05 VITALS — BP 119/60 | Ht 63.0 in | Wt 246.0 lb

## 2024-05-05 VITALS — BP 119/60 | HR 63 | Temp 97.3°F | Resp 18 | Ht 63.0 in | Wt 246.0 lb

## 2024-05-05 DIAGNOSIS — G8929 Other chronic pain: Secondary | ICD-10-CM | POA: Diagnosis present

## 2024-05-05 DIAGNOSIS — G894 Chronic pain syndrome: Secondary | ICD-10-CM | POA: Insufficient documentation

## 2024-05-05 DIAGNOSIS — M5481 Occipital neuralgia: Secondary | ICD-10-CM | POA: Diagnosis not present

## 2024-05-05 DIAGNOSIS — M792 Neuralgia and neuritis, unspecified: Secondary | ICD-10-CM | POA: Insufficient documentation

## 2024-05-05 DIAGNOSIS — M7918 Myalgia, other site: Secondary | ICD-10-CM | POA: Diagnosis not present

## 2024-05-05 DIAGNOSIS — R140 Abdominal distension (gaseous): Secondary | ICD-10-CM

## 2024-05-05 DIAGNOSIS — M47816 Spondylosis without myelopathy or radiculopathy, lumbar region: Secondary | ICD-10-CM | POA: Insufficient documentation

## 2024-05-05 DIAGNOSIS — F411 Generalized anxiety disorder: Secondary | ICD-10-CM | POA: Diagnosis not present

## 2024-05-05 MED ORDER — BUPRENORPHINE 15 MCG/HR TD PTWK: 1.0000 | MEDICATED_PATCH | TRANSDERMAL | 0 refills | Status: DC

## 2024-05-05 MED ORDER — PREGABALIN 100 MG PO CAPS: 100.0000 mg | ORAL_CAPSULE | Freq: Three times a day (TID) | ORAL | 2 refills | Status: DC

## 2024-05-05 NOTE — Progress Notes (Signed)
 MyChart Video Visit  Virtual Visit via Video Note   This visit type was conducted because this format is felt to be most appropriate for this patient at this time. Physical exam was limited by quality of the video and audio technology used for the visit. CMA was able to get the patient set up on a video visit.  Patient location: Home. Patient and provider in visit Provider location: Office  I discussed the limitations of evaluation and management by telemedicine and the availability of in person appointments. The patient expressed understanding and agreed to proceed.  Visit Date: 05/05/2024        Today's healthcare provider: Leron Glance, NP          Subjective:    Patient ID: Anita Gibbs, female    DOB: [birth year], 53 y.o.   MRN: [id number]  No chief complaint on file.   HPI  Discussed the use of AI scribe software for clinical note transcription with the patient, who gave verbal consent to proceed.  History of Present Illness   Anita Gibbs is a 53 year old female who presents with persistent abdominal bloating and gas.  She experiences significant abdominal bloating and gas, which worsens with eating or even drinking water. Described as 'swelling up like I'm pregnant' after eating. This bloating is not relieved by burping. She experiences occasional acid reflux, but it is not significant.  These symptoms have been present for a long time, with a recent increase in frequency and severity. The bloating typically subsides after a bowel movement, although she has not had one today. She denies constipation and reports regular bowel movements otherwise.  She experiences abdominal pain, described as normal for her, with increased frequency lately. Cramping occurs, particularly at night.  Her last imaging for abdominal pain and bloating was nearly a year ago. She has not seen a gastroenterologist recently, although she has been referred in the past.  Current medications  include Protonix  and dicyclomine , taken regularly. She has not tried over-the-counter medications like Gas-X or Beano for her symptoms.      Past Medical History:  Diagnosis Date   Abdominal pain 06/11/2020   Abnormal drug screen 03/06/2020   Abnormal MRI, cervical spine (06/10/2018) 09/27/2018   FINDINGS:  Vertebrae: fusion hardware at C4, C5, C6, and C7.  Posterior Fossa, vertebral arteries, paraspinal tissues: A relatively empty sella present.     Disc levels:  C3-4: Negative. A mild broad-based disc osteophyte complex present. Uncovertebral spurring contributes to mild foraminal narrowing bilaterally.  C4-5: A leftward disc osteophyte complex is present. Uncovertebral and facet disease   Acute cystitis without hematuria 06/18/2020   Acute vaginitis 06/11/2020   Adenoma of left adrenal gland 08/01/2020   Adrenal nodule (HCC) 06/20/2020   Allergy     ANA positive 02/01/2021   Anxiety and depression 01/29/2021   Arthritis    Back pain with history of spinal surgery 01/29/2021   Bilateral carpal tunnel syndrome 01/29/2021   Bilateral leg edema 01/29/2021   BMI 40.0-44.9, adult (HCC) 04/30/2022   Carpal boss, right    Carpal tunnel syndrome, bilateral    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) 09/27/2018   Levels:  C4-5: There is partial effacement of ventral CSF.     IMPRESSION:  Mild residual central canal narrowing at C4-5 s/p ACDF.   Cervical facet hypertrophy (C3-T1) 09/27/2018   Levels:  C3-4: Uncovertebral spurring  C4-5: Uncovertebral and facet disease  C5-6: Residual uncovertebral disease.  C6-7: Residual uncovertebral  disease.  C7-T1: Minimal left-sided uncovertebral spurring   Cervical facet joint syndrome (Bilateral) (L>R) 09/27/2018   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-5, C5-6, and C6-7) 09/27/2018   Levels:  C3-4: Mild foraminal narrowing bilaterally.  C4-5: Mild left foraminal narrowing.  C5-6: Mild left foraminal narrowing is due to residual  uncovertebral disease.  C6-7: Mild left foraminal narrowing is due to residual uncovertebral disease.   Cervical myelopathy (HCC) 07/01/2017   Cervicalgia (Primary Area of Pain) (Bilateral) (L>R) 09/27/2018   Chronic gout of foot (Left) 05/31/2018   Chronic low back pain [facility] Area of Pain) (Bilateral) (R>L) w/ sciatica (Bilateral) 09/07/2018   Chronic low back pain [facility] Area of Pain) (Bilateral) (R>L) w/o sciatica 07/26/2018   Chronic lower extremity pain (Fourth Area of Pain) (Bilateral) (R>L) 09/07/2018   Chronic musculoskeletal pain 10/27/2018   Chronic neck pain (Bilateral) w/ history of cervical spinal surgery 09/27/2018   Chronic neck pain (Primary Area of Pain) (Bilateral) (L>R) 09/07/2018   Chronic sacroiliac joint dysfunction (Bilateral) 09/27/2018   Chronic sacroiliac joint pain (Right) 09/07/2018   Chronic upper extremity pain (Secondary Area of Pain) (Bilateral) (R>L) 09/27/2018   Disorder of skeletal system 09/07/2018   E. coli UTI (urinary tract infection) 06/20/2020   Elevated sed rate 09/08/2018   Elevated serum creatinine 06/20/2020   Excessive daytime sleepiness 03/06/2020   Female pelvic pain 09/05/2020   GERD (gastroesophageal reflux disease)    Headache    History of allergy  to shellfish 09/28/2018   History of fusion of cervical spine (ACDF C4-C7) 09/27/2018   History of illicit drug use 03/07/2020   Hives 09/05/2020   Hives 09/05/2020   Hyperkalemia 05/31/2018   Mild   Hyperlipidemia 04/30/2022   Hypertension    Large breasts 05/30/2020   Left hip pain 07/21/2022   Lumbar facet arthropathy 04/20/2019   Lumbar radiculitis (L5 dermatome) (Right) 04/17/2020   Multiple environmental allergies 01/17/2020   Multiple food allergies 03/07/2020   Neurogenic pain 10/27/2018   Numbness and tingling of both feet 05/31/2018   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   Other intervertebral disc degeneration, lumbar region 11/15/2019    Pharmacologic therapy 09/07/2018   Plantar fasciitis, bilateral 03/04/2021   Polyneuropathy 01/29/2021   Prediabetes 10/10/2019   Problems influencing health status 09/07/2018   Rash 08/08/2019   Seasonal allergic rhinitis due to pollen 11/29/2018   Seasonal allergies 03/06/2020   Seasonal asthma 11/29/2018   Sjogren's disease (HCC)    Sleep apnea    Somatic dysfunction of sacroiliac joint (Bilateral) 09/27/2018   Spondylosis without myelopathy or radiculopathy, cervical region 03/22/2019   Spondylosis without myelopathy or radiculopathy, lumbosacral region 05/19/2019   Spondylosis, cervical, w/ myelopathy 09/27/2018   Vaginal discharge 06/11/2020    Past Surgical History:  Procedure Laterality Date   ABDOMINAL HYSTERECTOMY  10/17/2020   [facility]; she has both ovaries   ANTERIOR CERVICAL DECOMP/DISCECTOMY FUSION N/A 07/01/2017   Procedure: ANTERIOR CERVICAL DECOMPRESSION/DISCECTOMY FUSION 3 LEVELS;  Surgeon: Clois Fret, MD;  Location: [facility] ORS;  Service: Neurosurgery;  Laterality: N/A;   CARPAL TUNNEL RELEASE Bilateral    KNEE ARTHROSCOPY Right     Family History  Problem Relation Age of Onset   [name] Mother    Diabetes Mother    Arthritis Mother    Hypertension Mother    Hyperlipidemia Mother    Hypertension Father    Arthritis Father    Hearing loss Father    Kidney disease Father    Lymphoma Father  large b cell    Arthritis Sister    Hypertension Sister    Cancer Brother        ? lung due to exposure    COPD Brother    Early death Brother    Post-traumatic stress disorder Brother    Breast cancer Maternal Aunt    Breast cancer Paternal Aunt    Sleep apnea Daughter    COPD Son    Depression Son    Post-traumatic stress disorder Son    Allergic rhinitis Neg Hx    Angioedema Neg Hx    Eczema Neg Hx    Urticaria Neg Hx    Asthma Neg Hx     Social History   Socioeconomic History   Marital status: Widowed    Spouse name: Not on file    Number of children: 2   Years of education: Not on file   Highest education level: Some college, no degree  Occupational History   Not on file  Tobacco Use   Smoking status: Never   Smokeless tobacco: Never  Vaping Use   Vaping status: Never Used  Substance and Sexual Activity   Alcohol use: Yes    Alcohol/week: 2.0 standard drinks of alcohol    Types: 2 Glasses of wine per week    Comment: occ   Drug use: No   Sexual activity: Yes    Birth control/protection: None  Other Topics Concern   Not on file  Social History Narrative   Bus driver    And school custodian    1 daughter lives in [place] 27 y.o as of 01/17/20   1 son 24 almost 81 in [place]   [employer]    Social Drivers of Health   Financial Resource Strain: Medium Risk (03/14/2024)   Overall Financial Resource Strain (CARDIA)    Difficulty of Paying Living Expenses: Somewhat hard  Food Insecurity: Food Insecurity Present (03/14/2024)   Hunger Vital Sign    Worried About Running Out of Food in the Last Year: Often true    Ran Out of Food in the Last Year: Often true  Transportation Needs: No Transportation Needs (03/14/2024)   PRAPARE - Administrator, Civil Service (Medical): No    Lack of Transportation (Non-Medical): No  Physical Activity: Inactive (03/14/2024)   Exercise Vital Sign    Days of Exercise per Week: 0 days    Minutes of Exercise per Session: Not on file  Stress: Stress Concern Present (03/14/2024)   [employer] of Occupational Health - Occupational Stress Questionnaire    Feeling of Stress: Very much  Social Connections: Moderately Integrated (03/14/2024)   Social Connection and Isolation Panel    Frequency of Communication with Friends and Family: Three times a week    Frequency of Social Gatherings with Friends and Family: Twice a week    Attends Religious Services: 1 to 4 times per year    Active Member of [employer] or Organizations: Yes    Attends Tax inspector Meetings: Never    Marital Status: Widowed  Catering manager Violence: Not on file     Allergies  Allergen Reactions   Other Hives    White and red sauces   Pineapple     Swelling  Other Reaction(s): Not available  pineapple allergenic extract   Pineapple Extract Swelling    pineapple extract   Shellfish Allergy  Hives   Strawberry (Diagnostic) Hives   Tomato Hives  cherry  Solanum (organism)    ROS See HPI    Objective:    Physical Exam  BP 119/60 Comment: PT REPORTED  Ht 5' 3 (1.6 m)   Wt 246 lb (111.6 kg)   LMP  (LMP Unknown)   BMI 43.58 kg/m  Wt Readings from Last 3 Encounters:  05/05/24 246 lb (111.6 kg)  05/05/24 246 lb (111.6 kg)  04/13/24 238 lb (108 kg)   GENERAL: alert, oriented, appears well and in no acute distress   HEENT: atraumatic, conjunttiva clear, no obvious abnormalities on inspection of external nose and ears   NECK: normal movements of the head and neck   LUNGS: on inspection no signs of respiratory distress, breathing rate appears normal, no obvious gross SOB, gasping or wheezing   CV: no obvious cyanosis   MS: moves all visible extremities without noticeable abnormality   PSYCH/NEURO: pleasant and cooperative, no obvious depression or anxiety, speech and thought processing grossly intact    Assessment & Plan:   Problem List Items Addressed This Visit     GAD (generalized anxiety disorder)   Relevant Medications   busPIRone  (BUSPAR ) 10 MG tablet   Abdominal bloating - Primary   Chronic bloating and pain persist despite minimal intake, resolving post-bowel movement. Previous imaging unremarkable. Current medications may provide some relief. Refer to gastroenterology for evaluation and possible endoscopy. Advise Protonix  in the morning and add Pepcid  at bedtime. Continue dicyclomine  as prescribed. Recommend over-the-counter Gas-X. Advise bland diet to minimize gas. Instruct to seek medical attention for severe pain  or persistent distension.       Relevant Orders   Ambulatory referral to Gastroenterology    I have changed Anita C. Gatt's busPIRone . I am also having her maintain her EPINEPHrine , Vitamin D -3, Cyanocobalamin  (B-12 PO), polyethylene glycol powder, cetirizine , saccharomyces boulardii, potassium chloride  SA, montelukast , losartan , hydrocortisone , fluticasone , albuterol , DULoxetine , hydrOXYzine , amLODipine , triamcinolone  cream, ibuprofen, butalbital -acetaminophen -caffeine , dicyclomine , Melatonin, traZODone , hydrochlorothiazide , methocarbamol , traMADol , Zepbound , pregabalin , and buprenorphine .  Meds ordered this encounter  Medications   busPIRone  (BUSPAR ) 10 MG tablet    Sig: Take 2 tablets (20 mg total) by mouth 2 (two) times daily.    Supervising Provider:   TULLO, TERESA L [[id number]]   I discussed the assessment and treatment plan with the patient. The patient was provided an opportunity to ask questions and all were answered. The patient agreed with the plan and demonstrated an understanding of the instructions.   The patient was advised to call back or seek an in-person evaluation if the symptoms worsen or if the condition fails to improve as anticipated.   Leron Glance, NP [facility] [phone] (phone) [phone] (fax)  [facility]

## 2024-05-05 NOTE — Progress Notes (Signed)
 Safety precautions to be maintained throughout the outpatient stay will include: orient to surroundings, keep bed in low position, maintain call bell within reach at all times, provide assistance with transfer out of bed and ambulation.

## 2024-05-05 NOTE — Telephone Encounter (Signed)
 Called patient to ask about triied and failed medications.  She had taken oxycodone  a while ago but most recently has been taking buprenorphine  patches which were working well.  When she changed insurance she was unable to get this medication.  Dr Marcelino had called in Tramadol  100 mg ER for her to take d/t being unable to obtain other medications.  Patient did not pick that Rx up.  I told patient that while we are waiting to filel appeal that she could pick that up and have pain medication.  I did tell her to call us  next week to let us  know how medication was doing and get her scheduled appropriately for next fill.  Her next appt currently is in December which will not work.  Patient verbalizes u/o information and reports that she has taken tramadol  in the past when give at the ED.

## 2024-05-05 NOTE — Telephone Encounter (Signed)
 Called pt to get virtual started, left voicemail to cb or get connected to virtual    E2C2 IT IS OKAY TO TRANSFER CALL TO OFFICE

## 2024-05-05 NOTE — Progress Notes (Signed)
 PROVIDER NOTE: Interpretation of information contained herein should be left to medically-trained personnel. Specific patient instructions are provided elsewhere under Patient Instructions section of medical record. This document was created in part using AI and STT-dictation technology, any transcriptional errors that may result from this process are unintentional.  Patient: Anita Gibbs  Service: E/M   PCP: Anita App, NP  DOB: 08-Feb-1971  DOS: 05/05/2024  Provider: Emmy MARLA Blanch, NP  MRN: [id number]  Delivery: Face-to-face  Specialty: Interventional Pain Management  Type: Established Patient  Setting: Ambulatory outpatient facility  Specialty designation: 09  Referring Prov.: Anita App, NP  Location: Outpatient office facility       History of present illness (HPI) [name], a 53 y.o. year old female, is here today because of her Chronic musculoskeletal pain [M79.18, G89.29]. [name] primary complain today is Back Pain (Lower Back, pain radiating into left leg ) and Neck Pain (Neck Pain radiating down bilateral arms )  Pertinent problems: [name] has has Bilateral occipital neuralgia; Lumbar pain; Morbid obesity (BMI of 45.0-49.9, adult) (HCC); and Chronic pain syndrome on their pertinent problem list.   Pain Assessment: Severity of Chronic pain is reported as a 7 /10. Location: Neck Left, Right/Neck pain radiates down bilateral arms. Onset: More than a month ago. Quality: Aching, Dull, Throbbing, Stabbing. Timing: Constant. Modifying factor(s): Heat, Ice, Medication. Vitals:  height is 5' 3 (1.6 m) and weight is 246 lb (111.6 kg). Her temporal temperature is 97.3 F (36.3 C) (abnormal). Her blood pressure is 119/60 and her pulse is 63. Her respiration is 18 and oxygen saturation is 100%.  BMI: Estimated body mass index is 43.58 kg/m as calculated from the following:   Height as of this encounter: 5' 3 (1.6 m).   Weight as of this encounter: 246 lb (111.6  kg).  Last encounter: 04/07/2024. Last procedure: 04/13/2024  Reason for encounter: both, medication management and post-procedure evaluation and assessment. No change in medical history since last visit.  Patient's pain is at baseline.  Patient continues multimodal pain regimen as prescribed.  States that it provides pain relief and improvement in functional status.   [name]  underwent a  diagnostic, Greater, Occipital Nerve Block on April 13, 2024. She reports experiencing no pain relief during the local anesthetic phase and followed no pain relief and improvement since the procedure.   Procedure Procedure:           Anesthesia, Analgesia, Anxiolysis:  Type: Diagnostic, Greater, Occipital Nerve Block  #1  Region: Posterolateral Cervical Level: Occipital Ridge   Laterality: Bilateral   Anesthesia: Local (1-2% Lidocaine )  Anxiolysis: None  Sedation: Minimal  Guidance: Fluoroscopy             Position: Prone    1. Bilateral occipital neuralgia   2. Chronic pain syndrome     NAS-11 Pain score:        Pre-procedure: 7 /10        Post-procedure: 2 /10   Effectiveness:  Initial hour after procedure: 0 % . Subsequent 4-6 hours post-procedure: 0 % . Analgesia past initial 6 hours: 0 % . Ongoing improvement:  Analgesic:  [name]  underwent a  diagnostic, Greater, Occipital Nerve Block on April 13, 2024. She reports experiencing no pain relief during the local anesthetic phase and followed no pain relief and improvement since the procedure.  Function: No improvement ROM: No improvement  Pharmacotherapy Assessment   Butrans  patch 15 mcg/hr 1 patch onto the  skin once a week  Methocarbamol  (Robaxin ) 750 mg tablet every 8 hours as needed for muscle spasm Pregabalin  (Lyrica ) 100 mg capsule 3 times daily for neuropathic pain Monitoring: [place] PMP: PDMP reviewed during this encounter.       Pharmacotherapy: No side-effects or adverse reactions reported. Compliance: No problems  identified. Effectiveness: Clinically acceptable.  [name], [place]  05/05/2024  9:18 AM  Sign when Signing Visit Safety precautions to be maintained throughout the outpatient stay will include: orient to surroundings, keep bed in low position, maintain call bell within reach at all times, provide assistance with transfer out of bed and ambulation.     UDS:  Summary  Date Value Ref Range Status  02/01/2020 Note  Final    Comment:    ==================================================================== [facility] Select 13 (MW) ==================================================================== Test                             Result       Flag       Units Drug Present not Declared for Prescription Verification   Carboxy-THC                    20           UNEXPECTED ng/mg creat    Carboxy-THC is a metabolite of tetrahydrocannabinol (THC). Source of    THC is most commonly herbal marijuana or marijuana-based products,    but THC is also present in a scheduled prescription medication.    Trace amounts of THC can be present in hemp and cannabidiol (CBD)    products. This test is not intended to distinguish between delta-9-    tetrahydrocannabinol, the predominant form of THC in most herbal or    marijuana-based products, and delta-8-tetrahydrocannabinol. Drug Absent but Declared for Prescription Verification   Oxycodone                       Not Detected UNEXPECTED ng/mg creat ==================================================================== Test                      Result    Flag   Units      Ref Range   Creatinine              230              mg/dL      >=79 ==================================================================== Declared Medications:  The flagging and interpretation on this report are based on the  following declared medications.  Unexpected results may arise from  inaccuracies in the declared medications.  **Note: The testing scope of this panel includes these  medications:  Oxycodone  (Roxicodone )  **Note: The testing scope of this panel does not include the  following reported medications:  Albuterol  (Ventolin  HFA)  Amlodipine  (Norvasc )  Atorvastatin  (Lipitor)  Cetirizine   Diphenhydramine (Benadryl)  Epinephrine  (EpiPen )  Fluticasone  (Flonase )  Gabapentin  (Neurontin )  Hydrochlorothiazide   Losartan  (Cozaar )  Meloxicam  (Mobic )  Methocarbamol  (Robaxin )  Montelukast  (Singulair )  Norethindrone (Aygestin)  Pantoprazole  (Protonix )  Vitamin D3 ==================================================================== For clinical consultation, please call [phone]. ====================================================================     No results found for: CBDTHCR No results found for: D8THCCBX No results found for: D9THCCBX  ROS  Constitutional: Denies any fever or chills Gastrointestinal: No reported hemesis, hematochezia, vomiting, or acute GI distress Musculoskeletal: Denies any acute onset joint swelling, redness, loss of ROM, or weakness Neurological: No reported episodes of acute onset apraxia,  aphasia, dysarthria, agnosia, amnesia, paralysis, loss of coordination, or loss of consciousness  Medication Review  Cyanocobalamin , DULoxetine , EPINEPHrine , Melatonin, Vitamin D -3, albuterol , amLODipine , atorvastatin , buprenorphine , busPIRone , butalbital -acetaminophen -caffeine , cetirizine , dicyclomine , fluticasone , hydrOXYzine , hydrochlorothiazide , hydrocortisone , ibuprofen, losartan , methocarbamol , montelukast , pantoprazole , polyethylene glycol powder, potassium chloride  SA, pregabalin , saccharomyces boulardii, tirzepatide , traMADol , traZODone , and triamcinolone  cream  History Review  Allergy : [name] is allergic to other, pineapple, pineapple extract, shellfish allergy , strawberry (diagnostic), and tomato. Drug: [name]  reports no history of drug use. Alcohol:  reports current alcohol use of about 2.0 standard drinks of  alcohol per week. Tobacco:  reports that she has never smoked. She has never used smokeless tobacco. Social: [name]  reports that she has never smoked. She has never used smokeless tobacco. She reports current alcohol use of about 2.0 standard drinks of alcohol per week. She reports that she does not use drugs. 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), Adrenal nodule (HCC) (06/20/2020), Allergy , ANA positive (02/01/2021), Anxiety and depression (01/29/2021), Arthritis, Back pain with history of spinal surgery (01/29/2021), Bilateral carpal tunnel syndrome (01/29/2021), Bilateral leg edema (01/29/2021), BMI 40.0-44.9, adult (HCC) (04/30/2022), Carpal boss, right, Carpal tunnel syndrome, bilateral, 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) (09/27/2018), Cervical facet hypertrophy (C3-T1) (09/27/2018), Cervical facet joint syndrome (Bilateral) (L>R) (09/27/2018), 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-5, C5-6, and C6-7) (09/27/2018), Cervical myelopathy (HCC) (07/01/2017), Cervicalgia (Primary Area of Pain) (Bilateral) (L>R) (09/27/2018), Chronic gout of foot (Left) (05/31/2018), Chronic low back pain (Tertiary Area of Pain) (Bilateral) (R>L) w/ sciatica (Bilateral) (09/07/2018), Chronic low back pain (Tertiary Area of Pain) (Bilateral) (R>L) w/o sciatica (07/26/2018), Chronic lower extremity pain (Fourth Area of Pain) (Bilateral) (R>L) (09/07/2018), Chronic musculoskeletal pain (10/27/2018), Chronic neck pain (Bilateral) w/ history of cervical spinal surgery (09/27/2018), Chronic neck pain (Primary Area of Pain) (Bilateral) (L>R) (09/07/2018), Chronic sacroiliac joint dysfunction (Bilateral) (09/27/2018), Chronic sacroiliac joint pain (Right) (09/07/2018),  Chronic upper extremity pain (Secondary Area of Pain) (Bilateral) (R>L) (09/27/2018), Disorder of skeletal system (09/07/2018), E. coli UTI (urinary tract infection) (06/20/2020), Elevated sed rate (09/08/2018), Elevated serum creatinine (06/20/2020), Excessive daytime sleepiness (03/06/2020), Female pelvic pain (09/05/2020), GERD (gastroesophageal reflux disease), Headache, History of allergy  to shellfish (09/28/2018), History of fusion of cervical spine (ACDF C4-C7) (09/27/2018), History of illicit drug use (03/07/2020), Hives (09/05/2020), Hives (09/05/2020), Hyperkalemia (05/31/2018), Hyperlipidemia (04/30/2022), Hypertension, Large breasts (05/30/2020), Left hip pain (07/21/2022), Lumbar facet arthropathy (04/20/2019), Lumbar radiculitis (L5 dermatome) (Right) (04/17/2020), Multiple environmental allergies (01/17/2020), Multiple food allergies (03/07/2020), Neurogenic pain (10/27/2018), Numbness and tingling of both feet (05/31/2018), 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), Other intervertebral disc degeneration, 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), Pharmacologic therapy (09/07/2018), Plantar fasciitis, bilateral (03/04/2021), Polyneuropathy (01/29/2021), Prediabetes (10/10/2019), Problems influencing health status (09/07/2018), Rash (08/08/2019), Seasonal allergic rhinitis due to pollen (11/29/2018), Seasonal allergies (03/06/2020), Seasonal asthma (11/29/2018), Sjogren's disease (HCC), Sleep apnea, Somatic dysfunction of sacroiliac joint (Bilateral) (09/27/2018), Spondylosis without myelopathy or radiculopathy, cervical region (03/22/2019), Spondylosis without myelopathy or radiculopathy, lumbosacral region (05/19/2019), Spondylosis, cervical, 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 Surgical: [name]  has a past surgical history that includes Knee arthroscopy (Right); Anterior cervical decomp/discectomy fusion (N/A, 07/01/2017); Abdominal hysterectomy (10/17/2020); and  Carpal tunnel release (Bilateral). Family: family history includes [name] in her mother; Arthritis in her father, mother, and sister; Breast cancer in her maternal aunt and paternal aunt; COPD in her brother and  son; Cancer in her brother; Depression in her son; Diabetes in her mother; Early death in her brother; Hearing loss in her father; Hyperlipidemia in her mother; Hypertension in her father, mother, and sister; Kidney disease in her father; Lymphoma in her father; Post-traumatic stress disorder in her brother and son; Sleep apnea in her daughter.  Laboratory Chemistry Profile   Renal Lab Results  Component Value Date   BUN 16 03/16/2024   CREATININE 0.95 03/16/2024   BCR NOT APPLICABLE 02/13/2021   GFR 69.78 03/16/2024   GFRAA 76 09/07/2018   GFRNONAA >60 11/18/2023    Hepatic Lab Results  Component Value Date   AST 12 (L) 11/18/2023   ALT 12 11/18/2023   ALBUMIN 3.9 11/18/2023   ALKPHOS 45 11/18/2023   LIPASE 32.0 06/30/2022    Electrolytes Lab Results  Component Value Date   NA 138 03/16/2024   K 3.7 03/16/2024   CL 104 03/16/2024   CALCIUM  9.3 03/16/2024   MG 2.2 09/07/2018    Bone Lab Results  Component Value Date   VD25OH 29.35 (L) 04/08/2023   25OHVITD1 8.1 (L) 09/07/2018   25OHVITD2 <1.0 09/07/2018   25OHVITD3 8.1 09/07/2018    Inflammation (CRP: Acute Phase) (ESR: Chronic Phase) Lab Results  Component Value Date   CRP <1.0 07/22/2023   ESRSEDRATE 57 (H) 07/22/2023         Note: Above Lab results reviewed.  Recent Imaging Review  NCV with EMG(electromyography) Tobie Tonita POUR, DO     03/10/2024 12:37 PM  [facility]  [address] [place], [address]  [place], [place] [address] Tel: [phone] Fax: [phone] Test Date:  03/10/2024  Patient: Anita Gibbs DOB: 1970/09/12 Physician: Tonita Tobie, DO   Sex: Female Height: 5' 3 Ref Phys: Glade Boys, DEVONNA  ID#: [id number]   Technician:    History: This is a 53 year old female s/p  C4-C7 ACDF (2018) referred for  evaluation of bilateral arm pain and paresthesias.   NCV & EMG Findings: Extensive electrodiagnostic testing of the right upper extremity  and additional studies of the left shows:  Bilateral median, ulnar, and mixed palmar sensory responses are  within normal limits. Bilateral median and ulnar motor responses are within normal  limits. There is no evidence of active or chronic motor axonal loss  changes affecting any of the tested muscles.  Motor unit  configuration and recruitment pattern is within normal limits.  Impression: This is a normal study of the upper extremities.  In particular,  there is no evidence of carpal tunnel syndrome or a cervical  radiculopathy.  ___________________________ Tonita Tobie, DO  Nerve Conduction Studies   Stim Site NR Peak (ms) Norm Peak (ms) O-P Amp (V) Norm O-P Amp  Left Median Anti Sensory (2nd Digit)  32 C  Wrist    2.9 <3.6 35.9 >15  Right Median Anti Sensory (2nd Digit)  32 C  Wrist    3.2 <3.6 29.0 >15  Left Ulnar Anti Sensory (5th Digit)  32 C  Wrist    2.8 <3.1 37.2 >10  Right Ulnar Anti Sensory (5th Digit)  32 C  Wrist    2.9 <3.1 39.1 >10    Stim Site NR Onset (ms) Norm Onset (ms) O-P Amp (mV) Norm O-P  Amp Site1 Site2 Delta-0 (ms) Dist (cm) Vel (m/s) Norm Vel (m/s)  Left Median Motor (Abd Poll Brev)  32 C  Wrist    2.9 <4.0 11.7 >6 Elbow Wrist 4.9 28.0 57 >50  Elbow    7.8  11.6         Right Median Motor (Abd Poll Brev)  32 C  Wrist    3.2 <4.0 11.9 >6 Elbow Wrist 4.6 29.0 63 >50  Elbow    7.8  11.0         Left Ulnar Motor (Abd Dig Minimi)  32 C  Wrist    2.7 <3.1 9.3 >7 B Elbow Wrist 3.9 20.0 51 >50  B Elbow    6.6  9.2  A Elbow B Elbow 1.8 10.0 56 >50  A Elbow    8.4  8.9         Right Ulnar Motor (Abd Dig Minimi)  32 C  Wrist    2.6 <3.1 9.1 >7 B Elbow Wrist 4.2 23.0 55 >50  B Elbow    6.8  8.7  A Elbow B Elbow 1.8 10.0 56 >50  A Elbow    8.6  8.2           Stim Site NR  Peak (ms) Norm Peak (ms) P-T Amp (V) Site1 Site2  Delta-P (ms) Norm Delta (ms)  Left Median/Ulnar Palm Comparison (Wrist - 8cm)  32 C  Median Palm    1.5 <2.2 49.6 Median Palm Ulnar Palm 0.2   Ulnar Palm    1.7 <2.2 11.6      Right Median/Ulnar Palm Comparison (Wrist - 8cm)  32 C  Median Palm    1.8 <2.2 45.9 Median Palm Ulnar Palm 0.1   Ulnar Palm    1.7 <2.2 13.0       Electromyography   Side Muscle Ins.Act Fibs Fasc Recrt Amp Dur Poly Activation  Comment  Right 1stDorInt Nml Nml Nml Nml Nml Nml Nml Nml N/A  Right PronatorTeres Nml Nml Nml Nml Nml Nml Nml Nml N/A  Right Biceps Nml Nml Nml Nml Nml Nml Nml Nml N/A  Right Triceps Nml Nml Nml Nml Nml Nml Nml Nml N/A  Right Deltoid Nml Nml Nml Nml Nml Nml Nml Nml N/A  Left 1stDorInt Nml Nml Nml Nml Nml Nml Nml Nml N/A  Left PronatorTeres Nml Nml Nml Nml Nml Nml Nml Nml N/A  Left Biceps Nml Nml Nml Nml Nml Nml Nml Nml N/A  Left Triceps Nml Nml Nml Nml Nml Nml Nml Nml N/A  Left Deltoid Nml Nml Nml Nml Nml Nml Nml Nml N/A   Waveforms:                        Note: Reviewed        Physical Exam  Vitals: BP 119/60 (BP Location: Right Arm, Patient Position: Sitting)   Pulse 63   Temp (!) 97.3 F (36.3 C) (Temporal)   Resp 18   Ht 5' 3 (1.6 m)   Wt 246 lb (111.6 kg)   LMP  (LMP Unknown)   SpO2 100%   BMI 43.58 kg/m  BMI: Estimated body mass index is 43.58 kg/m as calculated from the following:   Height as of this encounter: 5' 3 (1.6 m).   Weight as of this encounter: 246 lb (111.6 kg). Ideal: Ideal body weight: 52.4 kg (115 lb 8.3 oz) Adjusted ideal body weight: 76.1 kg (167 lb 11.4 oz) General appearance: Well nourished, well developed, and well hydrated. In no apparent acute distress Mental status: Alert, oriented x 3 (person, place, & time)       Respiratory: No evidence of acute respiratory distress Eyes:  PERLA   Assessment   Diagnosis Status  1. Chronic musculoskeletal pain   2. Chronic pain syndrome    3. Neurogenic pain   4. Bilateral occipital neuralgia   5. Lumbar facet joint syndrome   6. Lumbar facet arthropathy    Controlled Controlled Controlled   Updated Problems: No problems updated.  Plan of Care  Problem-specific:  Assessment and Plan We will continue on Butrans  patch methocarbamol  (Robaxin ) for pain management.  Prescribing drug monitoring (PDMP) reviewed; findings consistent with the use of prescribed medication.  Scheduled in 30 days for medication management.    [name] has a current medication list which includes the following long-term medication(s): albuterol , amlodipine , atorvastatin , cetirizine , dicyclomine , duloxetine , fluticasone , losartan , methocarbamol , montelukast , pantoprazole , potassium chloride  sa, trazodone , and pregabalin .  Pharmacotherapy (Medications Ordered): Meds ordered this encounter  Medications   pregabalin  (LYRICA ) 100 MG capsule    Sig: Take 1 capsule (100 mg total) by mouth 3 (three) times daily.    Dispense:  90 capsule    Refill:  2    Fill one day early if pharmacy is closed on scheduled refill date. May substitute for generic if available.   buprenorphine  (BUTRANS ) 15 MCG/HR    Sig: Place 1 patch onto the skin every 7 (seven) days.    Dispense:  4 patch    Refill:  0   Orders:  No orders of the defined types were placed in this encounter.       Return in about 3 months (around 08/04/2024) for (F2F), (MM), Anita Blanch NP.    Recent Visits Date Type Provider Dept  04/13/24 Procedure visit Marcelino Nurse, MD [facility]  04/07/24 Office Visit Shandee Jergens K, NP [facility]  03/22/24 Office Visit Marcelino Nurse, MD [facility]  03/08/24 Office Visit Leanndra Pember K, NP [facility]  Showing recent visits within past 90 days and meeting all other requirements Today's Visits Date Type Provider Dept  05/05/24 Office Visit Cesar Alf K, NP [facility]  Showing today's  visits and meeting all other requirements Future Appointments Date Type Provider Dept  08/01/24 Appointment Lillith Mcneff K, NP [facility]  Showing future appointments within next 90 days and meeting all other requirements  I discussed the assessment and treatment plan with the patient. The patient was provided an opportunity to ask questions and all were answered. The patient agreed with the plan and demonstrated an understanding of the instructions.  Patient advised to call back or seek an in-person evaluation if the symptoms or condition worsens.  Duration of encounter: 30 minutes.  Total time on encounter, as per AMA guidelines included both the face-to-face and non-face-to-face time personally spent by the physician and/or other qualified health care professional(s) on the day of the encounter (includes time in activities that require the physician or other qualified health care professional and does not include time in activities normally performed by clinical staff). Physician's time may include the following activities when performed: Preparing to see the patient (e.g., pre-charting review of records, searching for previously ordered imaging, lab work, and nerve conduction tests) Review of prior analgesic pharmacotherapies. Reviewing PMP Interpreting ordered tests (e.g., lab work, imaging, nerve conduction tests) Performing post-procedure evaluations, including interpretation of diagnostic procedures Obtaining and/or reviewing separately obtained history Performing a medically appropriate examination and/or evaluation Counseling and educating the patient/family/caregiver Ordering medications, tests, or procedures Referring and communicating with other health care professionals (when not separately reported) Documenting clinical  information in the electronic or other health record Independently interpreting results (not separately reported) and communicating results to the  patient/ family/caregiver Care coordination (not separately reported)  Note by: Alden Feagan K Jarmarcus Wambold, NP (TTS and AI technology used. I apologize for any typographical errors that were not detected and corrected.) Date: 05/05/2024; Time: 9:38 AM

## 2024-05-06 ENCOUNTER — Ambulatory Visit (INDEPENDENT_AMBULATORY_CARE_PROVIDER_SITE_OTHER): Admitting: Clinical

## 2024-05-06 ENCOUNTER — Other Ambulatory Visit: Payer: Self-pay

## 2024-05-06 DIAGNOSIS — F411 Generalized anxiety disorder: Secondary | ICD-10-CM

## 2024-05-06 DIAGNOSIS — F410 Panic disorder [episodic paroxysmal anxiety] without agoraphobia: Secondary | ICD-10-CM | POA: Diagnosis not present

## 2024-05-06 DIAGNOSIS — F32A Depression, unspecified: Secondary | ICD-10-CM | POA: Diagnosis not present

## 2024-05-06 DIAGNOSIS — E785 Hyperlipidemia, unspecified: Secondary | ICD-10-CM

## 2024-05-06 DIAGNOSIS — K219 Gastro-esophageal reflux disease without esophagitis: Secondary | ICD-10-CM

## 2024-05-06 MED ORDER — PANTOPRAZOLE SODIUM 40 MG PO TBEC: DELAYED_RELEASE_TABLET | ORAL | 3 refills | Status: AC

## 2024-05-06 MED ORDER — ATORVASTATIN CALCIUM 10 MG PO TABS: ORAL_TABLET | ORAL | 3 refills | Status: DC

## 2024-05-06 NOTE — Progress Notes (Signed)
   Darice Seats, LCSW

## 2024-05-06 NOTE — Progress Notes (Signed)
 [facility] Counselor/Therapist Progress Note  Patient ID: Anita Gibbs, MRN: [id number],    Date: 05/06/2024  Time Spent: 9:35am - 10:24am : 49 minutes   Treatment Type: Individual Therapy  Reported Symptoms: anxiety at times  Mental Status Exam: Appearance:  Neat and Well Groomed     Behavior: Appropriate  Motor: Normal  Speech/Language:  Clear and Coherent and Normal Rate  Affect: Appropriate  Mood: normal  Thought process: normal  Thought content:   WNL  Sensory/Perceptual disturbances:   WNL  Orientation: oriented to person, place, time/date, and situation  Attention: Good  Concentration: Good  Memory: WNL  Fund of knowledge:  Good  Insight:   Good  Judgment:  Good  Impulse Control: Good   Risk Assessment: Danger to Self:  No Patient denied current suicidal ideation  Self-injurious Behavior: No Danger to Others: No Patient denied current homicidal ideation Duty to Warn:no Physical Aggression / Violence:No  Access to Firearms a concern: No  Gang Involvement:No   Subjective: Patient stated, I've been doing ok, just still being in pain in response to events since last session. Patient stated, I'm just a little frustrated and reported frustration due to recent interactions with the gas company. Patient reported patient's father's gas bill has been transferred to patient's account and gas company is holding patient responsible for both balances. Patient requested information for local utility resources. Patient stated, every thing else has been going fine, just dealing with the pain in response to events since last session. Patient reported patient has not experienced relief from pain after recent nerve block in patient's neck. Patient reported its been up and down in response to mood since last session. Patient reported feelings of anxiety and frustration due to financial concerns. Patient reported no recent panic attacks. Patient stated,  it went  ok, yea it helped because I did calm down in response to implementation of grounding exercises. Patient stated, I try not to let stuff get to me as bad as it was before, with doing other stuff and reported using distractions as a coping strategy. Patient stated, journaling is helping out a whole lot. Patient stated,  sometimes there's negative thoughts in reference to thoughts associated with anxiety.   Interventions: Cognitive Behavioral Therapy and task centered. Clinician conducted session via caregility video from clinician's home office. Patient provided verbal consent to proceed with telehealth session and is aware of limitations of telephone or video visits. Patient participated in session from patient's home. Reviewed events since last session and assessed for changes. Explored and identified triggers for feelings of frustration and anxiety. Clinician utilized a task centered approach to assist patient in identifying legal resources and financial resources, such as, [employer] legal aid, utility resources. Reviewed grounding exercises and patient's implementation of grounding exercises. Assisted patient in exploring thoughts associated with anxiety. Provided psycho education related to cognitive distortions. Clinician requested for homework patient practice ground exercises and complete a thought record.    Collaboration of Care: not required at this time   Diagnosis:  Panic disorder   Generalized anxiety disorder   Depression, unspecified depression type     Plan: Patient is to utilize [employer] [facility], thought re-framing, behavioral activation, relaxation techniques, mindfulness and coping strategies to decrease symptoms associated with their diagnosis. Frequency: bi-weekly  Modality: individual      Long-term goal:   Reduce overall level, frequency, and intensity of the feelings of depression, anxiety and panic as evidenced by decreased panic attacks,fear of future panic  attacks, lack of  concentration, difficulty falling asleep and staying asleep, muscle tension, feeling on edge, restlessness, and irritability from 7 days/week to 1 to 3 days/week per patient report for at least 3 consecutive months. Target Date: 12/03/24  Progress: progressing    Short-term goal:  Identify and access local resources to increase patient's financial stability Target Date: 06/04/24  Progress: progressing    Decrease feelings of panic/anxiety from 4 to 5 days per week to 1 to 2 days per week by implementing healthy coping strategies, such as, relaxation techniques and mindfulness exercises Target Date: 06/04/24  Progress: progressing    Identify, challenge, and replace negative thought patterns and negative self talk that contribute to feelings of depression and anxiety with positive thoughts, beliefs, and positive self talk per patient's report Target Date: 06/04/24  Progress: progressing    Increase patient's participation in activities patient enjoys from 1 time per week to 3 times per week  Target Date: 06/04/24  Progress: progressing       Darice Seats, LCSW

## 2024-05-09 ENCOUNTER — Ambulatory Visit: Admitting: Clinical

## 2024-05-11 NOTE — Progress Notes (Unsigned)
 My Chart Video Visit- Progress Note:  Referring Physician:  Gretel App, NP [address] [place],  [place] [address]  Primary Physician:  Anita App, NP  This visit was performed via MyChart/video.   Patient location: home Provider location: working from home  I spent a total of 10 minutes non-face-to-face activities for this visit on the date of this encounter including review of current clinical condition and response to treatment.    Patient has given verbal consent to this MyChart video visit and we reviewed the limitations of a MyChart video visit. Patient wishes to proceed.    Chief Complaint:  follow up  History of Present Illness: Anita Gibbs is a 53 y.o. female has a history of HTN, asthma, GERD, chronic pain syndrome, hyperlipidemia, obesity.    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 Clois.   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    MRI showed moderate left/mild right foraminal stenosis C3-C4 along with moderate left foraminal stenosis C4-C5.   EMG of upper extremities was normal.   Last seen by me on 03/16/24 and she was sent to Dr. Marcelino to discuss injections. She declined PT.   MyChart visit scheduled for follow up.   She had bilateral occipital nerve block by Dr. Marcelino on 04/13/24 with no improvement. She continues on butrans  patch from Dr. Marcelino.   Right arm pain is better, but otherwise she is the same. She continues with constant neck pain radiating down left arm to the hand causing numbness and tingling. No current right arm pain. She has weakness in both hands.    She notes she is dropping things and that her balance is off- she has chronic balance issues but thinks things are worse.    She does not smoke.      Conservative measures:  Physical therapy:  aquatic PT for her back currently Multimodal medical therapy including regular antiinflammatories:  Cymbalta ,  Methocarbamol , Lyrica , Meloxicam  Injections:  04/13/24 bilateral occipital nerve block 10/06/2022, 05/18/2023 L3-4 and L4-5 Radiofrequency Ablation 05/19/2022, 07/07/2022 L3-4, L4-5 Medial Branch Block   Past Surgery:  07/01/2017 C4-C7 ACDF by Dr. Clois   The symptoms are causing a significant impact on the patient's life.     Exam: General: Patient is well developed, well nourished, calm, collected, and in no apparent distress. Attention to examination is appropriate.  Respiratory: Patient is breathing without any difficulty.    Awake, alert, oriented to person, place, and time.  Speech is clear and fluent. Fund of knowledge is appropriate.     Imaging: none  Assessment and Plan: [name] is s/p C4-C7 ACDF on 07/01/17 by Dr. Clois.    She continues with constant neck pain radiating down left arm to the hand causing numbness and tingling. No right arm pain. She has weakness in both hands.    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    MRI shows moderate left/mild right foraminal stenosis C3-C4 along with moderate left foraminal stenosis C4-C5.   EMG of upper extremities was normal.   She continued with hyper reflexia at her last in person visit. She is still having dexterity issues, dropping things, and having balance issues. Feels like her gait is off.   Treatment options discussed with patient and following plan made:   - MRI of thoracic spine to further evaluate balance/dexterity issues and  hyper reflexia.  - Message to pain management- is she still a candidate for cervical injections? Will let her know when I hear back.  - Depending on results of thoracic MRI, may consider referral to neurology.   - Once I have MRI results back, she will follow up for a MyChart visit.   Glade Boys PA-C Neurosurgery

## 2024-05-13 ENCOUNTER — Telehealth: Admitting: Orthopedic Surgery

## 2024-05-13 ENCOUNTER — Encounter: Payer: Self-pay | Admitting: Orthopedic Surgery

## 2024-05-13 DIAGNOSIS — M5011 Cervical disc disorder with radiculopathy,  high cervical region: Secondary | ICD-10-CM | POA: Diagnosis not present

## 2024-05-13 DIAGNOSIS — Z419 Encounter for procedure for purposes other than remedying health state, unspecified: Secondary | ICD-10-CM | POA: Diagnosis not present

## 2024-05-13 DIAGNOSIS — Z981 Arthrodesis status: Secondary | ICD-10-CM

## 2024-05-13 DIAGNOSIS — M47812 Spondylosis without myelopathy or radiculopathy, cervical region: Secondary | ICD-10-CM

## 2024-05-13 DIAGNOSIS — M5033 Other cervical disc degeneration, cervicothoracic region: Secondary | ICD-10-CM | POA: Diagnosis not present

## 2024-05-13 DIAGNOSIS — R292 Abnormal reflex: Secondary | ICD-10-CM | POA: Diagnosis not present

## 2024-05-13 DIAGNOSIS — M5412 Radiculopathy, cervical region: Secondary | ICD-10-CM

## 2024-05-18 ENCOUNTER — Encounter: Payer: Self-pay | Admitting: Nurse Practitioner

## 2024-05-18 MED ORDER — BUSPIRONE HCL 10 MG PO TABS: 20.0000 mg | ORAL_TABLET | Freq: Two times a day (BID) | ORAL | Status: AC

## 2024-05-18 NOTE — Assessment & Plan Note (Signed)
 Chronic bloating and pain persist despite minimal intake, resolving post-bowel movement. Previous imaging unremarkable. Current medications may provide some relief. Refer to gastroenterology for evaluation and possible endoscopy. Advise Protonix  in the morning and add Pepcid  at bedtime. Continue dicyclomine  as prescribed. Recommend over-the-counter Gas-X. Advise bland diet to minimize gas. Instruct to seek medical attention for severe pain or persistent distension.

## 2024-05-20 ENCOUNTER — Ambulatory Visit (INDEPENDENT_AMBULATORY_CARE_PROVIDER_SITE_OTHER): Admitting: Clinical

## 2024-05-20 DIAGNOSIS — F32A Depression, unspecified: Secondary | ICD-10-CM | POA: Diagnosis not present

## 2024-05-20 DIAGNOSIS — F411 Generalized anxiety disorder: Secondary | ICD-10-CM

## 2024-05-20 DIAGNOSIS — F410 Panic disorder [episodic paroxysmal anxiety] without agoraphobia: Secondary | ICD-10-CM

## 2024-05-20 NOTE — Progress Notes (Signed)
   Anita Seats, LCSW

## 2024-05-20 NOTE — Progress Notes (Signed)
 [facility] Counselor/Therapist Progress Note  Patient ID: Anita Gibbs, MRN: [id number],    Date: 05/20/2024  Time Spent: 12:34pm - 1:16pm : 42 minutes   Treatment Type: Individual Therapy  Reported Symptoms: migraine, decreased concentration, irritability  Mental Status Exam: Appearance:  Neat and Well Groomed     Behavior: Appropriate  Motor: Normal  Speech/Language:  Clear and Coherent and Normal Rate  Affect: Appropriate  Mood: normal  Thought process: normal  Thought content:   WNL  Sensory/Perceptual disturbances:   Headache   Orientation: oriented to person, place, time/date, and situation  Attention: Good  Concentration: Good  Memory: WNL  Fund of knowledge:  Good  Insight:   Good  Judgment:  Good  Impulse Control: Good   Risk Assessment: Danger to Self:  No Patient denied current suicidal ideation  Self-injurious Behavior: No Danger to Others: No Patient denied current homicidal ideation Duty to Warn:no Physical Aggression / Violence:No  Access to Firearms a concern: No  Gang Involvement:No   Subjective: Patient stated, been in pain, migraine headaches in response to events since last session. Patient reported currently experiencing a migraine. Patient reported migraines are coming from my neck and stated, its been going on for the last four days solid. Patient reported patient has reached out to patient's physicians. Patient stated, I try to think about others things but the pain is just too severe right now. Patient stated, it (migraine) has me not wanting to do anything. Patient reported a history of a migraine lasting for several days, went to the emergency room, and had surgery 1 week later. Patient reported patient plans to go to the emergency room tomorrow morning if patient is not feeling improvement. Patient reported patient has not been eating due to migraine and nausea. Patient reported concern medications may be contributing to  migraine and reported patient started a medication approximately 4 days ago. Patient stated, my focus is off my vision is a little blurry. Patient stated, I just been to myself in response to mood.  Patient reported patient has been staying to herself to avoid irritability with others. Patient stated, its becoming a little bit more than I want to put up with in reference to pain. Patient reported patient took several showers for relaxation and reported showers were helpful at the time of shower. Patient stated,  Its been up a little bit this week in reference to anxiety. Patient reported practicing imagery of the beach in response to anxiety and reported a decrease in anxiety. Patient repotted pain has been a trigger for anxiety. Patient stated, I've been trying in reference to use of coping strategies.   Interventions: Cognitive Behavioral Therapy. Clinician conducted session via caregility video from clinician's home office. Patient provided verbal consent to proceed with telehealth session and is aware of limitations of telephone or video visits. Patient participated in session from patient's home. Reviewed events since last session and assessed for changes. Provided supportive therapy, active listening, and validation as patient discussed current health concerns and the impact on patient's daily life. Provided psycho education related progressive muscle relaxation. Explored coping strategies patient utilizes in response to pain. Discussed patient following up with current medical providers or emergency room. Reviewed relaxation techniques, such as, deep breathing, imagery. Explored recent triggers for anxiety, coping strategies patient practiced in response, and the outcome. Clinician requested for homework patient practice ground exercises, relaxation techniques, and complete a thought record.   Collaboration of Care: not required at this time  Diagnosis:  Panic disorder   Generalized anxiety  disorder   Depression, unspecified depression type     Plan: Patient is to utilize [employer] Therapy, thought re-framing, behavioral activation, relaxation techniques, mindfulness and coping strategies to decrease symptoms associated with their diagnosis. Frequency: bi-weekly  Modality: individual      Long-term goal:   Reduce overall level, frequency, and intensity of the feelings of depression, anxiety and panic as evidenced by decreased panic attacks,fear of future panic attacks, lack of  concentration, difficulty falling asleep and staying asleep, muscle tension, feeling on edge, restlessness, and irritability from 7 days/week to 1 to 3 days/week per patient report for at least 3 consecutive months. Target Date: 12/03/24  Progress: progressing    Short-term goal:  Identify and access local resources to increase patient's financial stability Target Date: 06/04/24  Progress: progressing    Decrease feelings of panic/anxiety from 4 to 5 days per week to 1 to 2 days per week by implementing healthy coping strategies, such as, relaxation techniques and mindfulness exercises Target Date: 06/04/24  Progress: progressing    Identify, challenge, and replace negative thought patterns and negative self talk that contribute to feelings of depression and anxiety with positive thoughts, beliefs, and positive self talk per patient's report Target Date: 06/04/24  Progress: progressing    Increase patient's participation in activities patient enjoys from 1 time per week to 3 times per week  Target Date: 06/04/24  Progress: progressing         Darice Seats, LCSW

## 2024-05-24 ENCOUNTER — Encounter: Payer: Self-pay | Admitting: Student in an Organized Health Care Education/Training Program

## 2024-05-25 ENCOUNTER — Telehealth: Admitting: Psychiatry

## 2024-05-25 ENCOUNTER — Other Ambulatory Visit: Payer: Self-pay

## 2024-05-25 ENCOUNTER — Encounter: Payer: Self-pay | Admitting: Psychiatry

## 2024-05-25 DIAGNOSIS — G894 Chronic pain syndrome: Secondary | ICD-10-CM

## 2024-05-25 DIAGNOSIS — R52 Pain, unspecified: Secondary | ICD-10-CM

## 2024-05-25 DIAGNOSIS — G4701 Insomnia due to medical condition: Secondary | ICD-10-CM

## 2024-05-25 DIAGNOSIS — F411 Generalized anxiety disorder: Secondary | ICD-10-CM | POA: Diagnosis not present

## 2024-05-25 DIAGNOSIS — F3342 Major depressive disorder, recurrent, in full remission: Secondary | ICD-10-CM | POA: Diagnosis not present

## 2024-05-25 DIAGNOSIS — M792 Neuralgia and neuritis, unspecified: Secondary | ICD-10-CM

## 2024-05-25 NOTE — Progress Notes (Signed)
 Virtual Visit via Video Note  I connected with Anita Gibbs on 05/25/24 at  1:00 PM EDT by a video enabled telemedicine application and verified that I am speaking with the correct person using two identifiers.  Location Provider Location : [facility] Patient Location : Home  Participants: Patient , Provider   I discussed the limitations of evaluation and management by telemedicine and the availability of in person appointments. The patient expressed understanding and agreed to proceed.   I discussed the assessment and treatment plan with the patient. The patient was provided an opportunity to ask questions and all were answered. The patient agreed with the plan and demonstrated an understanding of the instructions.   The patient was advised to call back or seek an in-person evaluation if the symptoms worsen or if the condition fails to improve as anticipated.   BH MD OP Progress Note  05/25/2024 1:27 PM Anita Gibbs  MRN:  [id number]  Chief Complaint:  Chief Complaint  Patient presents with   Follow-up   Medication Refill   Depression   Anxiety   Insomnia   Discussed the use of AI scribe software for clinical note transcription with the patient, who gave verbal consent to proceed.  History of Present Illness Anita Gibbs is a 53 year old African-American female, previously widowed, currently engaged, unemployed, lives in [place], has a history of MDD, GAD, chronic pain, hypertension, GERD, obesity, seronegative spondyloarthropathy was evaluated by telemedicine today for a follow-up appointment.  The patient reports ongoing anxiety, which she relates to daily life stressors such as her financial situation and the ongoing process of applying for disability benefits. She reports having no current income and awaits a disability hearing in November, identifying this as a significant source of stress. Therapy sessions with [name] occur approximately every 2 weeks, and she  practices coping strategies learned in therapy, which she finds helpful in managing her anxiety and preventing escalation.  She denies any significant depression symptoms.  Her current medication regimen includes duloxetine  60 mg twice daily, buspirone  10 mg two tablets twice daily (total 40 mg daily), hydroxyzine  as needed (up to 3 times daily), and trazodone  for sleep. She takes hydroxyzine  only as needed. She also takes Zepbound  for weight loss and notes a weight loss of approximately 6 pounds. She is not currently taking buprenorphine , as tramadol  100 mg has replaced it, and she recently started Enbrel injections. She continues to await a sleep study, which she has not yet scheduled.  She denies any current thoughts of harming herself or others.   Visit Diagnosis:    ICD-10-CM   1. Recurrent major depressive disorder, in full remission  F33.42     2. Generalized anxiety disorder  F41.1     3. Insomnia due to medical condition  G47.01    Rule out sleep apnea, pain      Past Psychiatric History: I have reviewed past psychiatric history from progress note on 01/18/2024.  Past trials of medications like Cymbalta , hydroxyzine , BuSpar , trazodone .  Completed PHP program with [facility] 03/25/2024.  Past Medical History:  Past Medical History:  Diagnosis Date   Abdominal pain 06/11/2020   Abnormal drug screen 03/06/2020   Abnormal MRI, cervical spine (06/10/2018) 09/27/2018   FINDINGS:  Vertebrae: fusion hardware at C4, C5, C6, and C7.  Posterior Fossa, vertebral arteries, paraspinal tissues: A relatively empty sella present.     Disc levels:  C3-4: Negative. A mild broad-based disc osteophyte complex present. Uncovertebral spurring contributes  to mild foraminal narrowing bilaterally.  C4-5: A leftward disc osteophyte complex is present. Uncovertebral and facet disease   Acute cystitis without hematuria 06/18/2020   Acute vaginitis 06/11/2020   Adenoma of left adrenal gland  08/01/2020   Adrenal nodule 06/20/2020   Allergy     ANA positive 02/01/2021   Anxiety and depression 01/29/2021   Arthritis    Back pain with history of spinal surgery 01/29/2021   Bilateral carpal tunnel syndrome 01/29/2021   Bilateral leg edema 01/29/2021   BMI 40.0-44.9, adult (HCC) 04/30/2022   Carpal boss, right    Carpal tunnel syndrome, bilateral    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) 09/27/2018   Levels:  C4-5: There is partial effacement of ventral CSF.     IMPRESSION:  Mild residual central canal narrowing at C4-5 s/p ACDF.   Cervical facet hypertrophy (C3-T1) 09/27/2018   Levels:  C3-4: Uncovertebral spurring  C4-5: Uncovertebral and facet disease  C5-6: Residual uncovertebral disease.  C6-7: Residual uncovertebral disease.  C7-T1: Minimal left-sided uncovertebral spurring   Cervical facet joint syndrome (Bilateral) (L>R) 09/27/2018   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-5, C5-6, and C6-7) 09/27/2018   Levels:  C3-4: Mild foraminal narrowing bilaterally.  C4-5: Mild left foraminal narrowing.  C5-6: Mild left foraminal narrowing is due to residual uncovertebral disease.  C6-7: Mild left foraminal narrowing is due to residual uncovertebral disease.   Cervical myelopathy (HCC) 07/01/2017   Cervicalgia (Primary Area of Pain) (Bilateral) (L>R) 09/27/2018   Chronic gout of foot (Left) 05/31/2018   Chronic low back pain [facility] Area of Pain) (Bilateral) (R>L) w/ sciatica (Bilateral) 09/07/2018   Chronic low back pain [facility] Area of Pain) (Bilateral) (R>L) w/o sciatica 07/26/2018   Chronic lower extremity pain (Fourth Area of Pain) (Bilateral) (R>L) 09/07/2018   Chronic musculoskeletal pain 10/27/2018   Chronic neck pain (Bilateral) w/ history of cervical spinal surgery 09/27/2018   Chronic neck pain (Primary Area of Pain) (Bilateral) (L>R) 09/07/2018   Chronic sacroiliac joint dysfunction (Bilateral) 09/27/2018   Chronic sacroiliac joint pain (Right)  09/07/2018   Chronic upper extremity pain (Secondary Area of Pain) (Bilateral) (R>L) 09/27/2018   Disorder of skeletal system 09/07/2018   E. coli UTI (urinary tract infection) 06/20/2020   Elevated sed rate 09/08/2018   Elevated serum creatinine 06/20/2020   Excessive daytime sleepiness 03/06/2020   Female pelvic pain 09/05/2020   GERD (gastroesophageal reflux disease)    Headache    History of allergy  to shellfish 09/28/2018   History of fusion of cervical spine (ACDF C4-C7) 09/27/2018   History of illicit drug use 03/07/2020   Hives 09/05/2020   Hives 09/05/2020   Hyperkalemia 05/31/2018   Mild   Hyperlipidemia 04/30/2022   Hypertension    Large breasts 05/30/2020   Left hip pain 07/21/2022   Lumbar facet arthropathy 04/20/2019   Lumbar radiculitis (L5 dermatome) (Right) 04/17/2020   Multiple environmental allergies 01/17/2020   Multiple food allergies 03/07/2020   Neurogenic pain 10/27/2018   Numbness and tingling of both feet 05/31/2018   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   Other intervertebral disc degeneration, lumbar region 11/15/2019   Pharmacologic therapy 09/07/2018   Plantar fasciitis, bilateral 03/04/2021   Polyneuropathy 01/29/2021   Prediabetes 10/10/2019   Problems influencing health status 09/07/2018   Rash 08/08/2019   Seasonal allergic rhinitis due to pollen 11/29/2018   Seasonal allergies 03/06/2020   Seasonal asthma 11/29/2018   Sjogren's disease    Sleep apnea    Somatic dysfunction of  sacroiliac joint (Bilateral) 09/27/2018   Spondylosis without myelopathy or radiculopathy, cervical region 03/22/2019   Spondylosis without myelopathy or radiculopathy, lumbosacral region 05/19/2019   Spondylosis, cervical, w/ myelopathy 09/27/2018   Vaginal discharge 06/11/2020    Past Surgical History:  Procedure Laterality Date   ABDOMINAL HYSTERECTOMY  10/17/2020   [facility]; she has both ovaries   ANTERIOR CERVICAL DECOMP/DISCECTOMY FUSION  N/A 07/01/2017   Procedure: ANTERIOR CERVICAL DECOMPRESSION/DISCECTOMY FUSION 3 LEVELS;  Surgeon: Clois Fret, MD;  Location: [facility] ORS;  Service: Neurosurgery;  Laterality: N/A;   CARPAL TUNNEL RELEASE Bilateral    KNEE ARTHROSCOPY Right     Family Psychiatric History: I have reviewed family psychiatric history from progress note on 01/18/2024.  Family History:  Family History  Problem Relation Age of Onset   [name] Mother    Diabetes Mother    Arthritis Mother    Hypertension Mother    Hyperlipidemia Mother    Hypertension Father    Arthritis Father    Hearing loss Father    Kidney disease Father    Lymphoma Father        large b cell    Arthritis Sister    Hypertension Sister    Cancer Brother        ? lung due to exposure    COPD Brother    Early death Brother    Post-traumatic stress disorder Brother    Breast cancer Maternal Aunt    Breast cancer Paternal Aunt    Sleep apnea Daughter    COPD Son    Depression Son    Post-traumatic stress disorder Son    Allergic rhinitis Neg Hx    Angioedema Neg Hx    Eczema Neg Hx    Urticaria Neg Hx    Asthma Neg Hx     Social History: I have reviewed social history from progress note on 01/18/2024. Social History   Socioeconomic History   Marital status: Widowed    Spouse name: Not on file   Number of children: 2   Years of education: Not on file   Highest education level: Some college, no degree  Occupational History   Not on file  Tobacco Use   Smoking status: Never   Smokeless tobacco: Never  Vaping Use   Vaping status: Never Used  Substance and Sexual Activity   Alcohol use: Yes    Alcohol/week: 2.0 standard drinks of alcohol    Types: 2 Glasses of wine per week    Comment: occ   Drug use: No   Sexual activity: Yes    Birth control/protection: None  Other Topics Concern   Not on file  Social History Narrative   Bus driver    And school custodian    1 daughter lives in [place] 58 y.o as of  01/17/20   1 son 24 almost 42 in [place]   Fiance Darold [employer]ounds    Social Drivers of Health   Financial Resource Strain: Medium Risk (03/14/2024)   Overall Financial Resource Strain (CARDIA)    Difficulty of Paying Living Expenses: Somewhat hard  Food Insecurity: Food Insecurity Present (03/14/2024)   Hunger Vital Sign    Worried About Running Out of Food in the Last Year: Often true    Ran Out of Food in the Last Year: Often true  Transportation Needs: No Transportation Needs (03/14/2024)   PRAPARE - Administrator, Civil Service (Medical): No    Lack of Transportation (Non-Medical): No  Physical Activity: Inactive (03/14/2024)   Exercise Vital Sign    Days of Exercise per Week: 0 days    Minutes of Exercise per Session: Not on file  Stress: Stress Concern Present (03/14/2024)   [employer] of Occupational Health - Occupational Stress Questionnaire    Feeling of Stress: Very much  Social Connections: Moderately Integrated (03/14/2024)   Social Connection and Isolation Panel    Frequency of Communication with Friends and Family: Three times a week    Frequency of Social Gatherings with Friends and Family: Twice a week    Attends Religious Services: 1 to 4 times per year    Active Member of [employer] or Organizations: Yes    Attends Banker Meetings: Never    Marital Status: Widowed    Allergies:  Allergies  Allergen Reactions   Other Hives    White and red sauces   Pineapple     Swelling  Other Reaction(s): Not available  pineapple allergenic extract   Pineapple Extract Swelling    pineapple extract   Shellfish Allergy  Hives   Strawberry (Diagnostic) Hives   Tomato Hives    cherry  Solanum (organism)    Metabolic Disorder Labs: Lab Results  Component Value Date   HGBA1C 5.9 03/16/2024   No results found for: PROLACTIN Lab Results  Component Value Date   CHOL 246 (H) 04/08/2023   TRIG 113.0 04/08/2023   HDL 64.60 04/08/2023    CHOLHDL 4 04/08/2023   VLDL 22.6 04/08/2023   LDLCALC 159 (H) 04/08/2023   LDLCALC 108 (H) 08/29/2021   Lab Results  Component Value Date   TSH 1.43 04/08/2023   TSH 2.77 01/24/2021    Therapeutic Level Labs: No results found for: LITHIUM No results found for: VALPROATE No results found for: CBMZ  Current Medications: Current Outpatient Medications  Medication Sig Dispense Refill   ENBREL SURECLICK 50 MG/ML injection Inject into the skin.     albuterol  (VENTOLIN  HFA) 108 (90 Base) MCG/ACT inhaler Inhale 2 puffs into the lungs every 6 (six) hours as needed for wheezing or shortness of breath. 8 g 11   amLODipine  (NORVASC ) 5 MG tablet Take 1 tablet (5 mg total) by mouth daily. 90 tablet 0   atorvastatin  (LIPITOR) 10 MG tablet Take 1 tablet by mouth on Monday, Wednesday and Friday. 90 tablet 3   busPIRone  (BUSPAR ) 10 MG tablet Take 2 tablets (20 mg total) by mouth 2 (two) times daily.     butalbital -acetaminophen -caffeine  (FIORICET) 50-325-40 MG tablet Take 1 tablet by mouth every 6 (six) hours as needed for headache. 30 tablet 0   cetirizine  (ZYRTEC  ALLERGY ) 10 MG tablet Take 1 tablet (10 mg total) by mouth at bedtime as needed. 90 tablet 3   Cholecalciferol (VITAMIN D -3) 125 MCG (5000 UT) TABS Take by mouth daily.     Cyanocobalamin  (B-12 PO) Take by mouth.     dicyclomine  (BENTYL ) 20 MG tablet TAKE 1 TABLET BY MOUTH FOUR TIMES DAILY (BEFORE MEALS AND AT BEDTIME) 120 tablet 5   DULoxetine  (CYMBALTA ) 60 MG capsule Take 1 capsule (60 mg total) by mouth 2 (two) times daily. 60 capsule 2   EPINEPHrine  (AUVI-Q ) 0.3 mg/0.3 mL IJ SOAJ injection Use as directed for severe allergic reaction 2 Device 2   fluticasone  (FLONASE ) 50 MCG/ACT nasal spray Place 2 sprays into both nostrils daily. 16 g 6   hydrochlorothiazide  (HYDRODIURIL ) 25 MG tablet      hydrocortisone  2.5 % cream Apply topically 2 (two) times  daily. Prn to eyelids 30 g 0   hydrOXYzine  (VISTARIL ) 25 MG capsule Take 1 capsule  (25 mg total) by mouth 3 (three) times daily. 90 capsule 2   ibuprofen (ADVIL) 800 MG tablet Take 800 mg by mouth 3 (three) times daily.     losartan  (COZAAR ) 100 MG tablet Take 1 tablet (100 mg total) by mouth daily. 90 tablet 3   Melatonin 10 MG CAPS Take 10 mg by mouth at bedtime. 30 capsule 6   methocarbamol  (ROBAXIN ) 750 MG tablet Take 1 tablet (750 mg total) by mouth every 8 (eight) hours as needed for muscle spasms. 90 tablet 11   montelukast  (SINGULAIR ) 10 MG tablet Take 1 tablet (10 mg total) by mouth at bedtime. 90 tablet 3   pantoprazole  (PROTONIX ) 40 MG tablet TAKE ONE TABLET EVERY DAY 30 MIN BEFORE DINNER 90 tablet 3   polyethylene glycol powder (GLYCOLAX /MIRALAX ) 17 GM/SCOOP powder Take 17 g by mouth 2 (two) times daily as needed. 3350 g 1   potassium chloride  SA (KLOR-CON  M) 20 MEQ tablet Take 1 tablet (20 mEq total) by mouth daily as needed. With lasix  30 tablet 11   pregabalin  (LYRICA ) 100 MG capsule Take 1 capsule (100 mg total) by mouth 3 (three) times daily. 90 capsule 2   saccharomyces boulardii (FLORASTOR) 250 MG capsule Take 1 capsule (250 mg total) by mouth daily. 90 capsule 0   tirzepatide  (ZEPBOUND ) 5 MG/0.5ML Pen Inject 5 mg into the skin once a week. 2 mL 0   traMADol  (ULTRAM -ER) 100 MG 24 hr tablet Take 1 tablet (100 mg total) by mouth daily. 30 tablet 0   traZODone  (DESYREL ) 50 MG tablet Take 1-2 tablets (50-100 mg total) by mouth at bedtime as needed for sleep. 60 tablet 1   triamcinolone  cream (KENALOG ) 0.1 % Apply 1 Application topically 2 (two) times daily. 30 g 0   No current facility-administered medications for this visit.     Musculoskeletal: Strength & Muscle Tone: UTA Gait & Station: Seated Patient leans: N/A  Psychiatric Specialty Exam: Review of Systems  Psychiatric/Behavioral:  Positive for sleep disturbance. The patient is nervous/anxious.     There were no vitals taken for this visit.There is no height or weight on file to calculate BMI.   General Appearance: Casual  Eye Contact:  Fair  Speech:  Clear and Coherent  Volume:  Normal  Mood:  Anxious  Affect:  Congruent  Thought Process:  Goal Directed and Descriptions of Associations: Intact  Orientation:  Full (Time, Place, and Person)  Thought Content: Logical   Suicidal Thoughts:  No  Homicidal Thoughts:  No  Memory:  Immediate;   Fair Recent;   Fair Remote;   Fair  Judgement:  Fair  Insight:  Fair  Psychomotor Activity:  Normal  Concentration:  Concentration: Fair and Attention Span: Fair  Recall:  [employer] of Knowledge: Fair  Language: Fair  Akathisia:  No  Handed:  Left  AIMS (if indicated): not done  Assets:  Communication Skills Desire for Improvement Housing Social Support Transportation  ADL's:  Intact  Cognition: WNL  Sleep:  varies   Screenings: GAD-7    Loss adjuster, chartered Office Visit from 01/18/2024 in [facility] Office Visit from 01/05/2024 in [facility] [employer] Visit from 11/24/2023 in [facility] [employer] Visit from 11/02/2023 in [facility] [employer] Visit from 09/30/2023 in [facility] [employer] [facility]  Total GAD-7 Score 19 19 21 21 20    PHQ2-9    Flowsheet Row Office Visit from 05/05/2024 in [facility] Procedure visit from 04/13/2024 in [facility] Visit from 04/07/2024 in [facility] Video Visit from 03/31/2024 in [facility] Office Visit from 03/22/2024 in [facility] Total Score 3 2 2 2 1   PHQ-9 Total Score -- -- -- 5 --   Flowsheet Row Video Visit from 05/25/2024 in  [facility] Video Visit from 03/31/2024 in [facility] Counselor from 03/08/2024 in [facility]  C-SSRS RISK CATEGORY Moderate Risk No Risk No Risk     Assessment and Plan: Anita Gibbs is a 53 year old African-American female who has a history of depression, anxiety was evaluated by telemedicine today.  Discussed assessment and plan as noted below.  1. Recurrent major depressive disorder, in full remission Currently denies any significant depression symptoms other than sleep problems which likely is related to sleep apnea and pain. Continue BuSpar  20 mg twice daily Continue Duloxetine  60 mg twice daily. Continue psychotherapy sessions with Ms. Darice Seats.  2. Generalized anxiety disorder-unstable Does have ongoing anxiety mostly related to situational stressors although working on coping strategies and is compliant with psychotherapy per report.  Declines dosage increase of BuSpar  when this was suggested. Continue BuSpar  20 mg twice daily Continue Duloxetine  as prescribed Continue Hydroxyzine  25 mg 3 times a day, currently uses it as needed. Continue psychotherapy sessions.   3. Insomnia due to medical condition-unstable Sleep problems multifactorial including pain as well as possible sleep apnea. Currently awaiting sleep study. Continue Trazodone  50-100 mg at bedtime as needed.  Follow-up Follow-up in clinic in 3 months or sooner if needed.  Will consider referral back to primary care provider if patient declines any further medication changes and if her condition improves.    Collaboration of Care: Collaboration of Care: Other encouraged to complete sleep study.  Patient/Guardian was advised Release of Information must be obtained prior to any record release in order to collaborate their care with an outside provider. Patient/Guardian was advised if they have not  already done so to contact the registration department to sign all necessary forms in order for us  to release information regarding their care.   Consent: Patient/Guardian gives verbal consent for treatment and assignment of benefits for services provided during this visit. Patient/Guardian expressed understanding and agreed to proceed.   This note was generated in part or whole with voice recognition software. Voice recognition is usually quite accurate but there are transcription errors that can and very often do occur. I apologize for any typographical errors that were not detected and corrected.    Rafal Archuleta, MD 05/25/2024, 1:27 PM

## 2024-05-27 ENCOUNTER — Ambulatory Visit: Admitting: Clinical

## 2024-05-31 ENCOUNTER — Emergency Department
Admission: EM | Admit: 2024-05-31 | Discharge: 2024-06-01 | Disposition: A | Discharge status: Home / Self Care | Attending: Emergency Medicine | Admitting: Emergency Medicine

## 2024-05-31 ENCOUNTER — Other Ambulatory Visit: Payer: Self-pay

## 2024-05-31 ENCOUNTER — Emergency Department

## 2024-05-31 ENCOUNTER — Encounter: Payer: Self-pay | Admitting: Emergency Medicine

## 2024-05-31 DIAGNOSIS — J45909 Unspecified asthma, uncomplicated: Secondary | ICD-10-CM | POA: Diagnosis not present

## 2024-05-31 DIAGNOSIS — R519 Headache, unspecified: Secondary | ICD-10-CM | POA: Diagnosis not present

## 2024-05-31 DIAGNOSIS — R5383 Other fatigue: Secondary | ICD-10-CM | POA: Diagnosis not present

## 2024-05-31 DIAGNOSIS — J101 Influenza due to other identified influenza virus with other respiratory manifestations: Secondary | ICD-10-CM | POA: Diagnosis not present

## 2024-05-31 DIAGNOSIS — R0602 Shortness of breath: Secondary | ICD-10-CM | POA: Insufficient documentation

## 2024-05-31 DIAGNOSIS — R112 Nausea with vomiting, unspecified: Secondary | ICD-10-CM | POA: Insufficient documentation

## 2024-05-31 DIAGNOSIS — J111 Influenza due to unidentified influenza virus with other respiratory manifestations: Secondary | ICD-10-CM

## 2024-05-31 DIAGNOSIS — I10 Essential (primary) hypertension: Secondary | ICD-10-CM | POA: Insufficient documentation

## 2024-05-31 DIAGNOSIS — R059 Cough, unspecified: Secondary | ICD-10-CM | POA: Diagnosis not present

## 2024-05-31 LAB — URINALYSIS, ROUTINE W REFLEX MICROSCOPIC
Bilirubin Urine: NEGATIVE
Glucose, UA: NEGATIVE mg/dL
Hgb urine dipstick: NEGATIVE
Ketones, ur: NEGATIVE mg/dL
Leukocytes,Ua: NEGATIVE
Nitrite: NEGATIVE
Protein, ur: NEGATIVE mg/dL
Specific Gravity, Urine: 1.024 (ref 1.005–1.030)
pH: 5 (ref 5.0–8.0)

## 2024-05-31 LAB — COMPREHENSIVE METABOLIC PANEL WITH GFR
ALT: 12 U/L (ref 0–44)
AST: 19 U/L (ref 15–41)
Albumin: 4.2 g/dL (ref 3.5–5.0)
Alkaline Phosphatase: 54 U/L (ref 38–126)
Anion gap: 7 (ref 5–15)
BUN: 10 mg/dL (ref 6–20)
CO2: 25 mmol/L (ref 22–32)
Calcium: 9 mg/dL (ref 8.9–10.3)
Chloride: 106 mmol/L (ref 98–111)
Creatinine, Ser: 1.18 mg/dL — ABNORMAL HIGH (ref 0.44–1.00)
GFR, Estimated: 56 mL/min — ABNORMAL LOW (ref >60)
Glucose, Bld: 99 mg/dL (ref 70–99)
Potassium: 3.3 mmol/L — ABNORMAL LOW (ref 3.5–5.1)
Sodium: 138 mmol/L (ref 135–145)
Total Bilirubin: 0.6 mg/dL (ref 0.0–1.2)
Total Protein: 8.1 g/dL (ref 6.5–8.1)

## 2024-05-31 LAB — TROPONIN I (HIGH SENSITIVITY): Troponin I (High Sensitivity): 4 ng/L (ref <18)

## 2024-05-31 LAB — CBC
HCT: 38.3 % (ref 36.0–46.0)
Hemoglobin: 12.8 g/dL (ref 12.0–15.0)
MCH: 32.5 pg (ref 26.0–34.0)
MCHC: 33.4 g/dL (ref 30.0–36.0)
MCV: 97.2 fL (ref 80.0–100.0)
Platelets: 272 K/uL (ref 150–400)
RBC: 3.94 MIL/uL (ref 3.87–5.11)
RDW: 12.8 % (ref 11.5–15.5)
WBC: 6 K/uL (ref 4.0–10.5)
nRBC: 0 % (ref 0.0–0.2)

## 2024-05-31 LAB — RESP PANEL BY RT-PCR (RSV, FLU A&B, COVID)  RVPGX2
Influenza A by PCR: NEGATIVE
Influenza B by PCR: NEGATIVE
Resp Syncytial Virus by PCR: NEGATIVE
SARS Coronavirus 2 by RT PCR: NEGATIVE

## 2024-05-31 NOTE — ED Triage Notes (Signed)
 Pt arrives POV, ambulatory to triage, w/ no acute distress noted, c/o right lower abd pain, headache, n/v, and dry cough x 1 week. Pt reports vomiting 3-4 times today and noticed blood in vomit tonight. Reports tightness in chest and sob which is recurrent but normal for pt.

## 2024-05-31 NOTE — ED Notes (Signed)
Resp Swab collected and sent to lab

## 2024-06-01 ENCOUNTER — Ambulatory Visit: Admitting: Sleep Medicine

## 2024-06-01 LAB — LIPASE, BLOOD: Lipase: 22 U/L (ref 11–51)

## 2024-06-01 MED ORDER — NAPROXEN 500 MG PO TABS: 500.0000 mg | ORAL_TABLET | Freq: Two times a day (BID) | ORAL | 0 refills | Status: DC

## 2024-06-01 MED ORDER — ONDANSETRON 4 MG PO TBDP
8.0000 mg | ORAL_TABLET | Freq: Once | ORAL | Status: AC
  Administered 2024-06-01: 8 mg via ORAL
  Filled 2024-06-01: qty 2

## 2024-06-01 MED ORDER — ONDANSETRON 4 MG PO TBDP: 4.0000 mg | ORAL_TABLET | Freq: Three times a day (TID) | ORAL | 0 refills | Status: DC | PRN

## 2024-06-01 MED ORDER — FAMOTIDINE 20 MG PO TABS: 20.0000 mg | ORAL_TABLET | Freq: Two times a day (BID) | ORAL | 0 refills | Status: AC

## 2024-06-01 MED ORDER — NAPROXEN 500 MG PO TABS
500.0000 mg | ORAL_TABLET | Freq: Once | ORAL | Status: AC
  Administered 2024-06-01: 500 mg via ORAL
  Filled 2024-06-01: qty 1

## 2024-06-01 MED ORDER — ALUM & MAG HYDROXIDE-SIMETH 200-200-20 MG/5ML PO SUSP
30.0000 mL | Freq: Once | ORAL | Status: AC
  Administered 2024-06-01: 30 mL via ORAL
  Filled 2024-06-01: qty 30

## 2024-06-01 MED ORDER — FAMOTIDINE 20 MG PO TABS
40.0000 mg | ORAL_TABLET | Freq: Once | ORAL | Status: AC
Start: 2024-06-01 — End: 2024-06-01
  Administered 2024-06-01: 40 mg via ORAL
  Filled 2024-06-01: qty 2

## 2024-06-01 NOTE — ED Provider Notes (Signed)
 [facility] Provider Note    Event Date/Time   First MD Initiated Contact with Patient 05/31/24 2340     (approximate)   History   Chief Complaint: Abdominal Pain, Cough, Hematemesis, and Shortness of Breath   HPI  Anita Gibbs is a 53 y.o. female with a history of chronic pain syndrome, hypertension, obesity who comes ED complaining of generalized headache, nausea, vomiting, nonproductive cough, fatigue for the past week.  No abrupt changes.  No significant chest pain or shortness of breath.        Past Medical History:  Diagnosis Date   Abdominal pain 06/11/2020   Abnormal drug screen 03/06/2020   Abnormal MRI, cervical spine (06/10/2018) 09/27/2018   FINDINGS:  Vertebrae: fusion hardware at C4, C5, C6, and C7.  Posterior Fossa, vertebral arteries, paraspinal tissues: A relatively empty sella present.     Disc levels:  C3-4: Negative. A mild broad-based disc osteophyte complex present. Uncovertebral spurring contributes to mild foraminal narrowing bilaterally.  C4-5: A leftward disc osteophyte complex is present. Uncovertebral and facet disease   Acute cystitis without hematuria 06/18/2020   Acute vaginitis 06/11/2020   Adenoma of left adrenal gland 08/01/2020   Adrenal nodule 06/20/2020   Allergy     ANA positive 02/01/2021   Anxiety and depression 01/29/2021   Arthritis    Back pain with history of spinal surgery 01/29/2021   Bilateral carpal tunnel syndrome 01/29/2021   Bilateral leg edema 01/29/2021   BMI 40.0-44.9, adult (HCC) 04/30/2022   Carpal boss, right    Carpal tunnel syndrome, bilateral    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) 09/27/2018   Levels:  C4-5: There is partial effacement of ventral CSF.     IMPRESSION:  Mild residual central canal narrowing at C4-5 s/p ACDF.   Cervical facet hypertrophy (C3-T1) 09/27/2018   Levels:  C3-4: Uncovertebral spurring  C4-5: Uncovertebral and facet disease  C5-6: Residual uncovertebral  disease.  C6-7: Residual uncovertebral disease.  C7-T1: Minimal left-sided uncovertebral spurring   Cervical facet joint syndrome (Bilateral) (L>R) 09/27/2018   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-5, C5-6, and C6-7) 09/27/2018   Levels:  C3-4: Mild foraminal narrowing bilaterally.  C4-5: Mild left foraminal narrowing.  C5-6: Mild left foraminal narrowing is due to residual uncovertebral disease.  C6-7: Mild left foraminal narrowing is due to residual uncovertebral disease.   Cervical myelopathy (HCC) 07/01/2017   Cervicalgia (Primary Area of Pain) (Bilateral) (L>R) 09/27/2018   Chronic gout of foot (Left) 05/31/2018   Chronic low back pain [facility] Area of Pain) (Bilateral) (R>L) w/ sciatica (Bilateral) 09/07/2018   Chronic low back pain [facility] Area of Pain) (Bilateral) (R>L) w/o sciatica 07/26/2018   Chronic lower extremity pain (Fourth Area of Pain) (Bilateral) (R>L) 09/07/2018   Chronic musculoskeletal pain 10/27/2018   Chronic neck pain (Bilateral) w/ history of cervical spinal surgery 09/27/2018   Chronic neck pain (Primary Area of Pain) (Bilateral) (L>R) 09/07/2018   Chronic sacroiliac joint dysfunction (Bilateral) 09/27/2018   Chronic sacroiliac joint pain (Right) 09/07/2018   Chronic upper extremity pain (Secondary Area of Pain) (Bilateral) (R>L) 09/27/2018   Disorder of skeletal system 09/07/2018   E. coli UTI (urinary tract infection) 06/20/2020   Elevated sed rate 09/08/2018   Elevated serum creatinine 06/20/2020   Excessive daytime sleepiness 03/06/2020   Female pelvic pain 09/05/2020   GERD (gastroesophageal reflux disease)    Headache    History of allergy  to shellfish 09/28/2018   History of fusion of cervical  spine (ACDF C4-C7) 09/27/2018   History of illicit drug use 03/07/2020   Hives 09/05/2020   Hives 09/05/2020   Hyperkalemia 05/31/2018   Mild   Hyperlipidemia 04/30/2022   Hypertension    Large breasts 05/30/2020   Left hip pain  07/21/2022   Lumbar facet arthropathy 04/20/2019   Lumbar radiculitis (L5 dermatome) (Right) 04/17/2020   Multiple environmental allergies 01/17/2020   Multiple food allergies 03/07/2020   Neurogenic pain 10/27/2018   Numbness and tingling of both feet 05/31/2018   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   Other intervertebral disc degeneration, lumbar region 11/15/2019   Pharmacologic therapy 09/07/2018   Plantar fasciitis, bilateral 03/04/2021   Polyneuropathy 01/29/2021   Prediabetes 10/10/2019   Problems influencing health status 09/07/2018   Rash 08/08/2019   Seasonal allergic rhinitis due to pollen 11/29/2018   Seasonal allergies 03/06/2020   Seasonal asthma 11/29/2018   Sjogren's disease    Sleep apnea    Somatic dysfunction of sacroiliac joint (Bilateral) 09/27/2018   Spondylosis without myelopathy or radiculopathy, cervical region 03/22/2019   Spondylosis without myelopathy or radiculopathy, lumbosacral region 05/19/2019   Spondylosis, cervical, w/ myelopathy 09/27/2018   Vaginal discharge 06/11/2020    Current Outpatient Rx   Order #: [id number] Class: Normal   Order #: [id number] Class: Normal   Order #: [id number] Class: Normal   Order #: [id number] Class: Normal   Order #: [id number] Class: Normal   Order #: [id number] Class: Normal   Order #: [id number] Class: No Print   Order #: [id number] Class: Normal   Order #: [id number] Class: Normal   Order #: [id number] Class: Historical Med   Order #: [id number] Class: Historical Med   Order #: [id number] Class: Normal   Order #: [id number] Class: Normal   Order #: [id number] Class: Historical Med   Order #: [id number] Class: Normal   Order #: [id number] Class: Normal   Order #: [id number] Class: Historical Med   Order #: [id number] Class: Normal   Order #: [id number] Class: Normal   Order #: [id number] Class: Normal   Order #: [id number] Class: Normal   Order #: [id number] Class: Normal   Order #: [id number] Class: Normal   Order #:  [id number] Class: Normal   Order #: [id number] Class: Normal   Order #: [id number] Class: Normal   Order #: [id number] Class: Normal   Order #: [id number] Class: Normal   Order #: [id number] Class: Normal   Order #: [id number] Class: Normal   Order #: [id number] Class: Normal   Order #: [id number] Class: Normal    Past Surgical History:  Procedure Laterality Date   ABDOMINAL HYSTERECTOMY  10/17/2020   [facility]; she has both ovaries   ANTERIOR CERVICAL DECOMP/DISCECTOMY FUSION N/A 07/01/2017   Procedure: ANTERIOR CERVICAL DECOMPRESSION/DISCECTOMY FUSION 3 LEVELS;  Surgeon: Clois Fret, MD;  Location: [facility] ORS;  Service: Neurosurgery;  Laterality: N/A;   CARPAL TUNNEL RELEASE Bilateral    KNEE ARTHROSCOPY Right     Physical Exam   Triage Vital Signs: ED Triage Vitals [05/31/24 2216]  Encounter Vitals Group     BP (!) 141/85     Girls Systolic BP Percentile      Girls Diastolic BP Percentile      Boys Systolic BP Percentile      Boys Diastolic BP Percentile      Pulse Rate 78     Resp 18     Temp 99 F (37.2 C)     Temp Source Oral     SpO2 98 %     Weight 243 lb (110.2 kg)     Height 5' (1.524 m)     Head Circumference  Peak Flow      Pain Score 10     Pain Loc      Pain Education      Exclude from Growth Chart     Most recent vital signs: Vitals:   05/31/24 2216  BP: (!) 141/85  Pulse: 78  Resp: 18  Temp: 99 F (37.2 C)  SpO2: 98%    General: Awake, no distress.  CV:  Good peripheral perfusion.  Regular rate rhythm Resp:  Normal effort.  Clear lungs Abd:  No distention.  Soft nontender Other:  Moist oral mucosa   ED Results / Procedures / Treatments   Labs (all labs ordered are listed, but only abnormal results are displayed) Labs Reviewed  COMPREHENSIVE METABOLIC PANEL WITH GFR - Abnormal; Notable for the following components:      Result Value   Potassium 3.3 (*)    Creatinine, Ser 1.18 (*)    GFR, Estimated 56 (*)    All other components within normal  limits  URINALYSIS, ROUTINE W REFLEX MICROSCOPIC - Abnormal; Notable for the following components:   Color, Urine YELLOW (*)    APPearance HAZY (*)    All other components within normal limits  RESP PANEL BY RT-PCR (RSV, FLU A&B, COVID)  RVPGX2  LIPASE, BLOOD  CBC  TROPONIN I (HIGH SENSITIVITY)     EKG Interpreted by me Sinus rhythm rate of 70.  Right axis, no acute ischemic changes.   RADIOLOGY Chest x-ray interpreted by me, unremarkable.  Radiology report reviewed   PROCEDURES:  Procedures   MEDICATIONS ORDERED IN ED: Medications  ondansetron  (ZOFRAN -ODT) disintegrating tablet 8 mg (8 mg Oral Given 06/01/24 0042)  naproxen  (NAPROSYN ) tablet 500 mg (500 mg Oral Given 06/01/24 0043)  alum & mag hydroxide-simeth (MAALOX/MYLANTA) 200-200-20 MG/5ML suspension 30 mL (30 mLs Oral Given 06/01/24 0043)  famotidine  (PEPCID ) tablet 40 mg (40 mg Oral Given 06/01/24 0042)     IMPRESSION / MDM / ASSESSMENT AND PLAN / ED COURSE  I reviewed the triage vital signs and the nursing notes.  DDx: Influenza-like illness, COVID, flu, UTI, AKI, electrolyte derangement, low suspicion for ACS PE TAD pancreatitis or surgical abdomen meningitis  Patient's presentation is most consistent with acute presentation with potential threat to life or bodily function.  Presents with multitude of symptoms highly suspicious for influenza-like illness.  Initial workup reassuring.  Will provide supportive care.   Clinical Course as of 06/01/24 0308  Wed Jun 01, 2024  0139 Feeling better, tolerating oral intake.  Stable for discharge [PS]    Clinical Course User Index [PS] Viviann Pastor, MD     FINAL CLINICAL IMPRESSION(S) / ED DIAGNOSES   Final diagnoses:  Influenza-like illness     Rx / DC Orders   ED Discharge Orders          Ordered    naproxen  (NAPROSYN ) 500 MG tablet  2 times daily with meals        06/01/24 0139    famotidine  (PEPCID ) 20 MG tablet  2 times daily        06/01/24  0139    ondansetron  (ZOFRAN -ODT) 4 MG disintegrating tablet  Every 8 hours PRN        06/01/24 0139             Note:  This document was prepared using Dragon voice recognition software and may include unintentional dictation errors.   Viviann Pastor, MD 06/01/24 [phone]

## 2024-06-01 NOTE — Telephone Encounter (Signed)
 I have spoke with Mrs. Mannis and she has been scheduled to pick up HST machine on 06/02/2024

## 2024-06-02 ENCOUNTER — Ambulatory Visit

## 2024-06-02 DIAGNOSIS — G4733 Obstructive sleep apnea (adult) (pediatric): Secondary | ICD-10-CM

## 2024-06-03 ENCOUNTER — Ambulatory Visit (INDEPENDENT_AMBULATORY_CARE_PROVIDER_SITE_OTHER): Admitting: Clinical

## 2024-06-03 ENCOUNTER — Other Ambulatory Visit: Payer: Self-pay | Admitting: Nurse Practitioner

## 2024-06-03 DIAGNOSIS — F411 Generalized anxiety disorder: Secondary | ICD-10-CM | POA: Diagnosis not present

## 2024-06-03 DIAGNOSIS — G4733 Obstructive sleep apnea (adult) (pediatric): Secondary | ICD-10-CM

## 2024-06-03 DIAGNOSIS — F32A Depression, unspecified: Secondary | ICD-10-CM | POA: Diagnosis not present

## 2024-06-03 DIAGNOSIS — F410 Panic disorder [episodic paroxysmal anxiety] without agoraphobia: Secondary | ICD-10-CM | POA: Diagnosis not present

## 2024-06-03 NOTE — Progress Notes (Signed)
  Behavioral Health Counselor/Therapist Progress Note  Patient ID: Anita Gibbs, MRN: [id number],    Date: 06/03/2024  Time Spent: 10:34am - 11:18am : 44 minutes   Treatment Type: Individual Therapy  Reported Symptoms: anxiety  Mental Status Exam: Appearance:  Neat and Well Groomed     Behavior: Appropriate  Motor: Restlestness  Speech/Language:  Clear and Coherent and Normal Rate  Affect: Tearful  Mood: anxious  Thought process: normal  Thought content:   WNL  Sensory/Perceptual disturbances:   Headache   Orientation: oriented to person, place, time/date, and situation  Attention: Fair  Concentration: Fair  Memory: WNL  Fund of knowledge:  Good  Insight:   Good  Judgment:  Good  Impulse Control: Good   Risk Assessment: Danger to Self:  No Patient denied current suicidal ideation  Self-injurious Behavior: No  Danger to Others: No Patient denied current homicidal ideation Duty to Warn:no Physical Aggression / Violence:No  Access to Firearms a concern: No  Gang Involvement:No   Subjective: Patient stated, its not feeling the best, ended up going to the emergency room in response to patient's health since last session.  Patient reported a recent emergency room visit due to head, neck, and abdominal pain. Patient stated, now its on periodically in reference to recent headache and stated, I'm not keeping a headache all day. Patient stated, they come and go in reference to abdominal pain. Patient reported patient has follow up appointments on Monday to address recent symptoms and reported concern about diagnosis of Multiple Sclerosis due to feedback from rheumatologist. Patient reported patient's son was arrested for a DWI last night and stated, I'm feeling sick in response. Patient stated, I'm not suppose to know anything about it and reported a friend notified patient of son's arrest. Patient reported son has a history of arrests and substance use. Patient  reported patient's children did not want patient to know about son's arrest due to patient's recent hospitalization and increased blood pressure. Patient reported feeling patient's son is seeking attention. Patient stated, I don't know how I really feel about it and reported feeling when son calls he wants something. Patient reported patient's son is no longer allowed to live with patient. During today's session a Field seismologist served patient with papers to attend court proceedings. Patient stated, I just can't get a break and stated, right now I just don't care. Patient reported plans to cut up trees, read, and wash patient's car. Patient stated, I'm going to still try to stay positive. Patient reported feeling anxious currently.  Patient stated, I'm trying best I can and reported plans to sit outside and journal.   Interventions: Cognitive Behavioral Therapy. Clinician conducted session via caregility video from clinician's home office. Patient provided verbal consent to proceed with telehealth session and is aware of limitations of telephone or video visits. Patient participated in session from patient's home. Reviewed events since last session and assessed for changes. Discussed recent changes in patient's physical health and hospitalization. Discussed current stressors and patient's response. Explored and identified thoughts and feelings triggered by current stressors. Discussed boundaries patient has established with son. Processed thoughts and feelings in response to sheriff's visit during session. Provided psycho education related to mindfulness exercise of leaves on a stream. Reviewed additional coping strategies, such as, deep breathing exercises, participation in a positive activity, distraction, grounding exercises, journaling. Discussed identifying a positive thought for each negative thought. Clinician requested for homework patient practice ground exercises, relaxation techniques, and  complete a thought  record.    Collaboration of Care: not required at this time   Diagnosis:  Panic disorder   Generalized anxiety disorder   Depression, unspecified depression type     Plan: Patient is to utilize [employer] Therapy, thought re-framing, behavioral activation, relaxation techniques, mindfulness and coping strategies to decrease symptoms associated with their diagnosis. Frequency: bi-weekly  Modality: individual      Long-term goal:   Reduce overall level, frequency, and intensity of the feelings of depression, anxiety and panic as evidenced by decreased panic attacks,fear of future panic attacks, lack of  concentration, difficulty falling asleep and staying asleep, muscle tension, feeling on edge, restlessness, and irritability from 7 days/week to 1 to 3 days/week per patient report for at least 3 consecutive months. Target Date: 12/03/24  Progress: progressing    Short-term goal:  Identify and access local resources to increase patient's financial stability Target Date: 06/04/24  Progress: progressing    Decrease feelings of panic/anxiety from 4 to 5 days per week to 1 to 2 days per week by implementing healthy coping strategies, such as, relaxation techniques and mindfulness exercises Target Date: 06/04/24  Progress: progressing    Identify, challenge, and replace negative thought patterns and negative self talk that contribute to feelings of depression and anxiety with positive thoughts, beliefs, and positive self talk per patient's report Target Date: 06/04/24  Progress: progressing    Increase patient's participation in activities patient enjoys from 1 time per week to 3 times per week  Target Date: 06/04/24  Progress: progressing     Darice Seats, LCSW

## 2024-06-03 NOTE — Progress Notes (Signed)
   Darice Seats, LCSW

## 2024-06-06 ENCOUNTER — Other Ambulatory Visit (HOSPITAL_COMMUNITY): Payer: Self-pay

## 2024-06-06 ENCOUNTER — Other Ambulatory Visit: Payer: Self-pay | Admitting: Nurse Practitioner

## 2024-06-06 ENCOUNTER — Ambulatory Visit
Admission: RE | Admit: 2024-06-06 | Discharge: 2024-06-06 | Disposition: A | Discharge status: Home / Self Care | Source: Ambulatory Visit | Attending: Orthopedic Surgery | Admitting: Orthopedic Surgery

## 2024-06-06 ENCOUNTER — Encounter: Payer: Self-pay | Admitting: Nurse Practitioner

## 2024-06-06 ENCOUNTER — Telehealth: Payer: Self-pay

## 2024-06-06 DIAGNOSIS — G35D Multiple sclerosis, unspecified: Secondary | ICD-10-CM | POA: Diagnosis not present

## 2024-06-06 DIAGNOSIS — R292 Abnormal reflex: Secondary | ICD-10-CM | POA: Insufficient documentation

## 2024-06-06 DIAGNOSIS — M5412 Radiculopathy, cervical region: Secondary | ICD-10-CM | POA: Insufficient documentation

## 2024-06-06 DIAGNOSIS — M47812 Spondylosis without myelopathy or radiculopathy, cervical region: Secondary | ICD-10-CM | POA: Insufficient documentation

## 2024-06-06 DIAGNOSIS — Z981 Arthrodesis status: Secondary | ICD-10-CM | POA: Diagnosis not present

## 2024-06-06 DIAGNOSIS — I10 Essential (primary) hypertension: Secondary | ICD-10-CM

## 2024-06-06 DIAGNOSIS — G609 Hereditary and idiopathic neuropathy, unspecified: Secondary | ICD-10-CM | POA: Diagnosis not present

## 2024-06-06 MED ORDER — LOSARTAN POTASSIUM 100 MG PO TABS: 100.0000 mg | ORAL_TABLET | Freq: Every day | ORAL | 3 refills | Status: DC

## 2024-06-06 NOTE — Telephone Encounter (Signed)
 PA needed for zepbound 

## 2024-06-06 NOTE — Telephone Encounter (Signed)
PA request has been Submitted.

## 2024-06-07 ENCOUNTER — Other Ambulatory Visit: Payer: Self-pay | Admitting: Student in an Organized Health Care Education/Training Program

## 2024-06-07 DIAGNOSIS — G894 Chronic pain syndrome: Secondary | ICD-10-CM

## 2024-06-07 DIAGNOSIS — M792 Neuralgia and neuritis, unspecified: Secondary | ICD-10-CM

## 2024-06-07 NOTE — Telephone Encounter (Signed)
 Pharmacy Patient Advocate Encounter  Received notification from [facility] MEDICAID that Prior Authorization for Zepbound  5MG /0.5ML pen-injectors has been APPROVED from 06/06/2024 to 12/03/2024   PA #/Case ID/Reference #: [id number]

## 2024-06-08 ENCOUNTER — Telehealth: Payer: Self-pay | Admitting: *Deleted

## 2024-06-08 ENCOUNTER — Ambulatory Visit: Admitting: Nurse Practitioner

## 2024-06-08 ENCOUNTER — Telehealth: Payer: Self-pay

## 2024-06-08 ENCOUNTER — Other Ambulatory Visit (HOSPITAL_COMMUNITY)
Admission: RE | Admit: 2024-06-08 | Discharge: 2024-06-08 | Disposition: A | Discharge status: Home / Self Care | Source: Ambulatory Visit

## 2024-06-08 ENCOUNTER — Ambulatory Visit

## 2024-06-08 ENCOUNTER — Other Ambulatory Visit: Payer: Self-pay | Admitting: Neurology

## 2024-06-08 VITALS — BP 104/90 | HR 65 | Temp 98.6°F | Ht 63.0 in | Wt 245.0 lb

## 2024-06-08 DIAGNOSIS — N898 Other specified noninflammatory disorders of vagina: Secondary | ICD-10-CM | POA: Insufficient documentation

## 2024-06-08 DIAGNOSIS — R110 Nausea: Secondary | ICD-10-CM | POA: Diagnosis not present

## 2024-06-08 DIAGNOSIS — J301 Allergic rhinitis due to pollen: Secondary | ICD-10-CM

## 2024-06-08 DIAGNOSIS — G894 Chronic pain syndrome: Secondary | ICD-10-CM

## 2024-06-08 DIAGNOSIS — M792 Neuralgia and neuritis, unspecified: Secondary | ICD-10-CM

## 2024-06-08 DIAGNOSIS — G35D Multiple sclerosis, unspecified: Secondary | ICD-10-CM

## 2024-06-08 LAB — COMPREHENSIVE METABOLIC PANEL WITH GFR
ALT: 13 U/L (ref 0–35)
AST: 15 U/L (ref 0–37)
Albumin: 4.3 g/dL (ref 3.5–5.2)
Alkaline Phosphatase: 50 U/L (ref 39–117)
BUN: 10 mg/dL (ref 6–23)
CO2: 31 meq/L (ref 19–32)
Calcium: 9 mg/dL (ref 8.4–10.5)
Chloride: 102 meq/L (ref 96–112)
Creatinine, Ser: 0.96 mg/dL (ref 0.40–1.20)
GFR: 68.79 mL/min (ref >60.00)
Glucose, Bld: 100 mg/dL — ABNORMAL HIGH (ref 70–99)
Potassium: 3.4 meq/L — ABNORMAL LOW (ref 3.5–5.1)
Sodium: 140 meq/L (ref 135–145)
Total Bilirubin: 0.4 mg/dL (ref 0.2–1.2)
Total Protein: 7.4 g/dL (ref 6.0–8.3)

## 2024-06-08 LAB — LIPASE: Lipase: 37 U/L (ref 11.0–59.0)

## 2024-06-08 LAB — URINALYSIS, ROUTINE W REFLEX MICROSCOPIC
Bilirubin Urine: NEGATIVE
Hgb urine dipstick: NEGATIVE
Ketones, ur: NEGATIVE
Leukocytes,Ua: NEGATIVE
Nitrite: NEGATIVE
RBC / HPF: NONE SEEN (ref 0–?)
Specific Gravity, Urine: 1.02 (ref 1.000–1.030)
Total Protein, Urine: NEGATIVE
Urine Glucose: NEGATIVE
Urobilinogen, UA: 1 (ref 0.0–1.0)
pH: 6 (ref 5.0–8.0)

## 2024-06-08 MED ORDER — ONDANSETRON 4 MG PO TBDP: 4.0000 mg | ORAL_TABLET | Freq: Three times a day (TID) | ORAL | 0 refills | Status: AC | PRN

## 2024-06-08 NOTE — Assessment & Plan Note (Signed)
 Differential includes bacterial vaginosis, candida vaginitis, chlamydia, gonorrhea, trichomonas.  Has noted some urinary frequency, will obtain ua, urine culture.  Perform self-swab to r/o vaginitis.  Management pending results.

## 2024-06-08 NOTE — Assessment & Plan Note (Signed)
 Patient with chronic h/o nausea. Current episode of worsening nausea could be medication s/e Zepbound , Etanercept.  Taper off Enbrel for 1-2 weeks as advised by rheumatologist. Maintain symptom journal.  Hold off Zepbound  till f/u with PCP. Also f/u with rheumatologist as scheduled.  Take Zofran  8 mg every 8 hours as needed. New refill sent. Make sure you stay hydrated with 40-50 oz of water daily.

## 2024-06-08 NOTE — Patient Instructions (Addendum)
-   Make sure you stay hydrated with 40-50 oz of water daily.  - Take Zofran  1 tablet, every 8 hourly as needed for nausea.  - Use nasal flonase  2 puffs in each nostril daily for the next 10 days and you can use this as needed after that.  - Keep symptom journal to rule out Zepbound  side effects potentially contributing to nausea.  - Follow up with rheumatologist to determine further treatment with Etanercept vs other medication.

## 2024-06-08 NOTE — Progress Notes (Signed)
 Acute Office Visit  Subjective:    Patient ID: Anita Gibbs, female    DOB: 08/31/1971, 53 y.o.   MRN: [id number]  Chief Complaint  Patient presents with   Vaginal Discharge   HPI Discussed the use of AI scribe software for clinical note transcription with the patient, who gave verbal consent to proceed.  History of Present Illness Anita Gibbs is a 53 year old female who presents with nausea, sore throat, abdominal bloating after starting Enbrel injections.  She started Enbrel injections a couple of weeks ago, and since then, she has experienced nausea, vomiting, lightheadedness, sore throat, and abdominal pain, particularly around the time of her weekly injection. Despite taking Zofran  daily, she continues to experience persistent nausea and vomiting, primarily of water. She also takes famotidine  twice daily for acid reduction, which was started in early October.  She has been constipated rather than experiencing diarrhea, with her last bowel movement occurring the previous night. She has been unable to eat dinner for two weeks due to these symptoms.  She is also taking Zepbound  on Tuesdays, which was increased about a month ago. She has not taken Zepbound  this week due to pharmacy availability.  She reports a yellowish vaginal discharge that started about two weeks ago, without associated itching or burning. She urinates more frequently than normal but denies urgency or burning.  She has a history of seasonal allergies and uses Flonase  and Zyrtec , though not daily. She experiences a dry cough and occasional headaches, which she attributes to neck issues. No fever over 100F, but temperatures have reached up to 99.38F.   ROS As per HPI    Objective:    BP (!) 104/90 (BP Location: Right Arm, Patient Position: Sitting, Cuff Size: Normal)   Pulse 65   Temp 98.6 F (37 C) (Oral)   Ht 5' 3 (1.6 m)   Wt 245 lb (111.1 kg)   LMP  (LMP Unknown)   SpO2 98%   BMI 43.40 kg/m     Physical Exam Constitutional:      Appearance: She is obese.  HENT:     Head: Normocephalic and atraumatic.     Right Ear: Tympanic membrane normal. There is no impacted cerumen.     Left Ear: Tympanic membrane normal. There is no impacted cerumen.     Mouth/Throat:     Mouth: Mucous membranes are moist.     Pharynx: Oropharynx is clear. No oropharyngeal exudate.  Eyes:     Pupils: Pupils are equal, round, and reactive to light.  Cardiovascular:     Rate and Rhythm: Normal rate.  Pulmonary:     Effort: Pulmonary effort is normal.     Breath sounds: Normal breath sounds. No wheezing.  Abdominal:     General: Abdomen is protuberant.     Palpations: Abdomen is soft.     Tenderness: There is no guarding.  Musculoskeletal:     Cervical back: Neck supple. No rigidity.     Right lower leg: No edema.     Left lower leg: No edema.  Lymphadenopathy:     Cervical: No cervical adenopathy.  Neurological:     Mental Status: She is alert and oriented to person, place, and time.  Psychiatric:        Mood and Affect: Mood normal.     No results found for any visits on 06/08/24.     Assessment & Plan:  Patient's physical exam is reassuring.   Nausea Assessment & Plan:  Patient with chronic h/o nausea. Current episode of worsening nausea could be medication s/e Zepbound , Etanercept.  Taper off Enbrel for 1-2 weeks as advised by rheumatologist. Maintain symptom journal.  Hold off Zepbound  till f/u with PCP. Also f/u with rheumatologist as scheduled.  Take Zofran  8 mg every 8 hours as needed. New refill sent. Make sure you stay hydrated with 40-50 oz of water daily.    Orders: -     Ondansetron ; Take 1 tablet (4 mg total) by mouth every 8 (eight) hours as needed for nausea or vomiting.  Dispense: 20 tablet; Refill: 0 -     Comprehensive metabolic panel with GFR -     Lipase  Vaginal discharge Assessment & Plan:  Differential includes bacterial vaginosis, candida vaginitis,  chlamydia, gonorrhea, trichomonas.  Has noted some urinary frequency, will obtain ua, urine culture.  Perform self-swab to r/o vaginitis.  Management pending results.   Orders: -     Urine Culture -     Cervicovaginal ancillary only -     Urinalysis, Routine w reflex microscopic  Non-seasonal allergic rhinitis due to pollen Assessment & Plan: Has not been using nasal flonase  regularly. Recommend daily use of OTC nasal flonase , 2 puffs in each nostril for 10 days then as needed after that. Continye Zyrtec  10 mg daily.      Return As scheduled with PCP.  Luke Shade, MD

## 2024-06-08 NOTE — Assessment & Plan Note (Signed)
 Has not been using nasal flonase  regularly. Recommend daily use of OTC nasal flonase , 2 puffs in each nostril for 10 days then as needed after that. Continye Zyrtec  10 mg daily.

## 2024-06-08 NOTE — Telephone Encounter (Signed)
 Patient called, her tramadol  needs prior auth.

## 2024-06-09 ENCOUNTER — Other Ambulatory Visit (HOSPITAL_COMMUNITY): Payer: Self-pay

## 2024-06-09 ENCOUNTER — Ambulatory Visit: Payer: Self-pay

## 2024-06-09 DIAGNOSIS — E876 Hypokalemia: Secondary | ICD-10-CM | POA: Insufficient documentation

## 2024-06-09 DIAGNOSIS — B9689 Other specified bacterial agents as the cause of diseases classified elsewhere: Secondary | ICD-10-CM

## 2024-06-09 LAB — CERVICOVAGINAL ANCILLARY ONLY
Bacterial Vaginitis (gardnerella): POSITIVE — AB
Candida Glabrata: NEGATIVE
Candida Vaginitis: NEGATIVE
Chlamydia: NEGATIVE
Comment: NEGATIVE
Comment: NEGATIVE
Comment: NEGATIVE
Comment: NEGATIVE
Comment: NEGATIVE
Comment: NORMAL
Neisseria Gonorrhea: NEGATIVE
Trichomonas: NEGATIVE

## 2024-06-09 LAB — URINE CULTURE
MICRO NUMBER:: 17073276
Result:: NO GROWTH
SPECIMEN QUALITY:: ADEQUATE

## 2024-06-09 MED ORDER — POTASSIUM CHLORIDE CRYS ER 20 MEQ PO TBCR: 20.0000 meq | EXTENDED_RELEASE_TABLET | Freq: Every day | ORAL | 0 refills | Status: AC | PRN

## 2024-06-09 MED ORDER — METRONIDAZOLE 500 MG PO TABS: 500.0000 mg | ORAL_TABLET | Freq: Two times a day (BID) | ORAL | 0 refills | Status: DC

## 2024-06-09 NOTE — Progress Notes (Signed)
 1. Hypokalemia (Primary) - potassium chloride  SA (KLOR-CON  M) 20 MEQ tablet; Take 1 tablet (20 mEq total) by mouth daily as needed. With lasix   Dispense: 30 tablet; Refill: 0  Luke Shade, MD

## 2024-06-09 NOTE — Progress Notes (Signed)
 1. Hypokalemia (Primary) - potassium chloride  SA (KLOR-CON  M) 20 MEQ tablet; Take 1 tablet (20 mEq total) by mouth daily as needed. With lasix   Dispense: 30 tablet; Refill: 0  2. Bacterial vaginosis - metroNIDAZOLE  (FLAGYL ) 500 MG tablet; Take 1 tablet (500 mg total) by mouth 2 (two) times daily.  Dispense: 14 tablet; Refill: 0

## 2024-06-10 DIAGNOSIS — G4733 Obstructive sleep apnea (adult) (pediatric): Secondary | ICD-10-CM | POA: Diagnosis not present

## 2024-06-10 NOTE — Progress Notes (Signed)
 My Chart Video Visit- Progress Note:  Referring Physician:  Gretel App, NP [address] [place],  [place] [address]  Primary Physician:  Gretel App, NP  This visit was performed via MyChart/video.   Patient location: home Provider location: office  I spent a total of 10 minutes non-face-to-face activities for this visit on the date of this encounter including review of current clinical condition and response to treatment.    Patient has given verbal consent to this MyChart video visit and we reviewed the limitations of a MyChart video visit. Patient wishes to proceed.    Chief Complaint:  follow up  History of Present Illness: Anita Gibbs is a 53 y.o. female has a history of HTN, asthma, GERD, chronic pain syndrome, hyperlipidemia, obesity.    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 Clois.   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    MRI showed moderate left/mild right foraminal stenosis C3-C4 along with moderate left foraminal stenosis C4-C5.   EMG of upper extremities was normal.   Last did video visit with me on 05/13/24 and she is here to review her thoracic MRI scan.   Was seen by neurology Anastacio) on 06/06/24 and he ordered MRI of brain with and without contrast to evaluate for possible MS. He also did some labs.   She continues with constant neck pain radiating down left arm to the hand causing numbness and tingling. She has intermittent pain in right harm. She has weakness in both hands.   She notes she is dropping things and that her balance is off- she has chronic balance issues but thinks things are worse.    She does not smoke.      Conservative measures:  Physical therapy:  aquatic PT for her back currently- has not been in a few weeks.  Multimodal medical therapy including regular antiinflammatories:  Cymbalta , Methocarbamol , Lyrica , Meloxicam  Injections:  04/13/24 bilateral  occipital nerve block 10/06/2022, 05/18/2023 L3-4 and L4-5 Radiofrequency Ablation 05/19/2022, 07/07/2022 L3-4, L4-5 Medial Branch Block   Past Surgery:  07/01/2017 C4-C7 ACDF by Dr. Clois   The symptoms are causing a significant impact on the patient's life.   Exam: General: Patient is well developed, well nourished, calm, collected, and in no apparent distress. Attention to examination is appropriate.  Respiratory: Patient is breathing without any difficulty.    Awake, alert, oriented to person, place, and time.  Speech is clear and fluent. Fund of knowledge is appropriate.     Imaging: Thoracic MRI dated 06/06/24:  FINDINGS: Alignment:  No significant spondylolisthesis.   Vertebrae: Thoracic vertebral body height is maintained. 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 19 mm L2 vertebral body hemangioma.   Cord: No signal abnormality identified within the thoracic spinal cord.   Paraspinal and other soft tissues: 2.1 cm T2 hyperintense upper pole left renal lesion consistent with a cyst (for which no imaging follow-up is required). No paraspinal mass or collection.   Disc levels:   Mild multilevel disc degeneration.   Slight disc bulge without spinal canal stenosis at T1-T2.   No significant disc herniation or spinal canal stenosis at the remaining thoracic levels. No significant foraminal stenosis within the thoracic spine.   The cervical spine is incompletely assessed on localizer imaging. Prior C4-C7 ACDF.   IMPRESSION: 1. Unremarkable MRI appearance of the thoracic spinal cord. 2. Mild  thoracic spondylosis as described within the body of the report. No significant spinal canal or foraminal stenosis. 3. Cervical spine incompletely assessed on localizer imaging. Prior C4-C7 ACDF.     Electronically Signed   By: Rockey Childs D.O.   On: 06/10/2024 12:40  I have personally reviewed the images and  agree with the above interpretation.   Assessment and Plan: [name] is s/p C4-C7 ACDF on 07/01/17 by Dr. Clois.    She continues with constant neck pain radiating down left arm to the hand causing numbness and tingling. She has intermittent right arm pain. She has weakness in both hands.    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    MRI shows moderate left/mild right foraminal stenosis C3-C4 along with moderate left foraminal stenosis C4-C5.   EMG of upper extremities was normal.   She continued with hyper reflexia at her last in person visit. She is still having dexterity issues, dropping things, and having balance issues.   Thoracic MRI shows no central or foraminal stenosis. Note made of hemangioma at T12 and L2 along with left renal cyst.    Treatment options discussed with patient and following plan made:   - Reviewed MRI thoracic spine at length.  - Will send another message to pain management- is she still a candidate for cervical injections- will see if they can get her back into see Dr. Marcelino to discuss.  - MRI of brain done but not read yet. Will check these results.  - Once I review brain MRI and hear back from pain management regarding cervical injections, I will message her back. Will likely review with Dr. Clois as well.   Glade Boys PA-C Neurosurgery

## 2024-06-11 ENCOUNTER — Ambulatory Visit
Admission: RE | Admit: 2024-06-11 | Discharge: 2024-06-11 | Disposition: A | Source: Ambulatory Visit | Attending: Neurology | Admitting: Neurology

## 2024-06-11 DIAGNOSIS — G35D Multiple sclerosis, unspecified: Secondary | ICD-10-CM | POA: Insufficient documentation

## 2024-06-11 MED ORDER — GADOBUTROL 1 MMOL/ML IV SOLN
10.0000 mL | Freq: Once | INTRAVENOUS | Status: AC | PRN
Start: 2024-06-11 — End: 2024-06-11
  Administered 2024-06-11: 10 mL via INTRAVENOUS

## 2024-06-13 ENCOUNTER — Ambulatory Visit: Attending: Nurse Practitioner | Admitting: Nurse Practitioner

## 2024-06-13 ENCOUNTER — Other Ambulatory Visit: Payer: Self-pay | Admitting: Nurse Practitioner

## 2024-06-13 ENCOUNTER — Ambulatory Visit: Payer: Self-pay

## 2024-06-13 DIAGNOSIS — Z79899 Other long term (current) drug therapy: Secondary | ICD-10-CM

## 2024-06-13 DIAGNOSIS — M47816 Spondylosis without myelopathy or radiculopathy, lumbar region: Secondary | ICD-10-CM | POA: Diagnosis not present

## 2024-06-13 DIAGNOSIS — M7918 Myalgia, other site: Secondary | ICD-10-CM

## 2024-06-13 DIAGNOSIS — M792 Neuralgia and neuritis, unspecified: Secondary | ICD-10-CM | POA: Diagnosis not present

## 2024-06-13 DIAGNOSIS — G894 Chronic pain syndrome: Secondary | ICD-10-CM | POA: Diagnosis not present

## 2024-06-13 MED ORDER — TRAMADOL HCL ER 100 MG PO TB24: 100.0000 mg | ORAL_TABLET | Freq: Every day | ORAL | 0 refills | Status: DC

## 2024-06-13 NOTE — Progress Notes (Unsigned)
 PROVIDER NOTE: Interpretation of information contained herein should be left to medically-trained personnel. Specific patient instructions are provided elsewhere under Patient Instructions section of medical record. This document was created in part using AI and STT-dictation technology, any transcriptional errors that may result from this process are unintentional.  Patient: Anita Gibbs  Service: E/M Encounter  PCP: Gretel App, NP  DOB: 08-21-1971  DOS: 06/13/2024  Provider: Emmy MARLA Blanch, NP  MRN: [id number]  Delivery: Face-to-face  Specialty: Interventional Pain Management  Type: Established Patient  Setting: Ambulatory outpatient facility  Specialty designation: 09  Referring Prov.: No ref. provider found  Location: Outpatient office facility       Purpose:  The patient comes in today for {Blank single:19197::IM therapy,SCS analysis w/ or w/o programming,IT-Pump evaluation and possible adjustment,evaluation of operative/procedural site,evaluation of concerns about side/effect(s) or adverse reaction(s), *** }. Case was discussed with attending physician.  Subjective:  Anita Gibbs is a 53 y.o. year old, female patient, who comes today for a nurse visit complaining of No chief complaint on file. Her last contact with us  was on 06/13/2024. Severity of the pain is described as a  /10.   No notes on file  Objective:  [name]  vitals were not taken for this visit.  There is no height or weight on file to calculate BMI.  Analgesic:   Butrans  patch 15 mcg/hr 1 patch onto the skin once a week  Methocarbamol  (Robaxin ) 750 mg tablet every 8 hours as needed for muscle spasm Pregabalin  (Lyrica ) 100 mg capsule 3 times daily for neuropathic pain Monitoring: [place] PMP: PDMP reviewed during this encounter.       Pharmacotherapy: No side-effects or adverse reactions reported. Compliance: No problems identified. Effectiveness: Clinically acceptable.  Erlene Doyal Gibbs, [place]   05/05/2024  9:18 AM  Sign when Signing Visit Safety precautions to be maintained throughout the outpatient stay will include: orient to surroundings, keep bed in low position, maintain call bell within reach at all times, provide assistance with transfer out of bed and ambulation.     UDS:  Summary  Date Value Ref Range Status  02/01/2020 Note  Final    Comment:    ==================================================================== [facility] Select 13 (MW) ==================================================================== Test                             Result       Flag       Units Drug Present not Declared for Prescription Verification   Carboxy-THC                    20           UNEXPECTED ng/mg creat    Carboxy-THC is a metabolite of tetrahydrocannabinol (THC). Source of    THC is most commonly herbal marijuana or marijuana-based products,    but THC is also present in a scheduled prescription medication.    Trace amounts of THC can be present in hemp and cannabidiol (CBD)    products. This test is not intended to distinguish between delta-9-    tetrahydrocannabinol, the predominant form of THC in most herbal or    marijuana-based products, and delta-8-tetrahydrocannabinol. Drug Absent but Declared for Prescription Verification   Oxycodone                       Not Detected UNEXPECTED ng/mg creat ==================================================================== Test  Result    Flag   Units      Ref Range   Creatinine              230              mg/dL      >=79 ==================================================================== Declared Medications:  The flagging and interpretation on this report are based on the  following declared medications.  Unexpected results may arise from  inaccuracies in the declared medications.  **Note: The testing scope of this panel includes these medications:  Oxycodone  (Roxicodone )  **Note: The testing scope of this  panel does not include the  following reported medications:  Albuterol  (Ventolin  HFA)  Amlodipine  (Norvasc )  Atorvastatin  (Lipitor)  Cetirizine   Diphenhydramine (Benadryl)  Epinephrine  (EpiPen )  Fluticasone  (Flonase )  Gabapentin  (Neurontin )  Hydrochlorothiazide   Losartan  (Cozaar )  Meloxicam  (Mobic )  Methocarbamol  (Robaxin )  Montelukast  (Singulair )  Norethindrone (Aygestin)  Pantoprazole  (Protonix )  Vitamin D3 ==================================================================== For clinical consultation, please call [phone]. ====================================================================     No results found for: CBDTHCR No results found for: D8THCCBX No results found for: D9THCCBX  ROS  Constitutional: Denies any fever or chills Gastrointestinal: No reported hemesis, hematochezia, vomiting, or acute GI distress Musculoskeletal: Denies any acute onset joint swelling, redness, loss of ROM, or weakness Neurological: No reported episodes of acute onset apraxia, aphasia, dysarthria, agnosia, amnesia, paralysis, loss of coordination, or loss of consciousness  Medication Review  Cyanocobalamin , DULoxetine , EPINEPHrine , Melatonin, Vitamin D -3, albuterol , amLODipine , atorvastatin , buprenorphine , busPIRone , butalbital -acetaminophen -caffeine , cetirizine , dicyclomine , fluticasone , hydrOXYzine , hydrochlorothiazide , hydrocortisone , ibuprofen, losartan , methocarbamol , montelukast , pantoprazole , polyethylene glycol powder, potassium chloride  SA, pregabalin , saccharomyces boulardii, tirzepatide , traMADol , traZODone , and triamcinolone  cream  History Review  Allergy : Anita Gibbs is allergic to other, pineapple, pineapple extract, shellfish allergy , strawberry (diagnostic), and tomato. Drug: Anita Gibbs  reports no history of drug use. Alcohol:  reports current alcohol use of about 2.0 standard drinks of alcohol per week. Tobacco:  reports that she has never smoked. She has  never used smokeless tobacco. Social: Anita Gibbs  reports that she has never smoked. She has never used smokeless tobacco. She reports current alcohol use of about 2.0 standard drinks of alcohol per week. She reports that she does not use drugs. 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), Adrenal nodule (HCC) (06/20/2020), Allergy , ANA positive (02/01/2021), Anxiety and depression (01/29/2021), Arthritis, Back pain with history of spinal surgery (01/29/2021), Bilateral carpal tunnel syndrome (01/29/2021), Bilateral leg edema (01/29/2021), BMI 40.0-44.9, adult (HCC) (04/30/2022), Carpal boss, right, Carpal tunnel syndrome, bilateral, 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) (09/27/2018), Cervical facet hypertrophy (C3-T1) (09/27/2018), Cervical facet joint syndrome (Bilateral) (L>R) (09/27/2018), 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-5, C5-6, and C6-7) (09/27/2018), Cervical myelopathy (HCC) (07/01/2017), Cervicalgia (Primary Area of Pain) (Bilateral) (L>R) (09/27/2018), Chronic gout of foot (Left) (05/31/2018), Chronic low back pain (Tertiary Area of Pain) (Bilateral) (R>L) w/ sciatica (Bilateral) (09/07/2018), Chronic low back pain (Tertiary Area of Pain) (Bilateral) (R>L) w/o sciatica (07/26/2018), Chronic lower extremity pain (Fourth Area of Pain) (Bilateral) (R>L) (09/07/2018), Chronic musculoskeletal pain (10/27/2018), Chronic neck pain (Bilateral) w/ history of cervical spinal surgery (09/27/2018), Chronic neck pain (Primary Area of Pain) (Bilateral) (L>R) (09/07/2018), Chronic sacroiliac joint dysfunction (Bilateral) (09/27/2018), Chronic sacroiliac joint pain (Right) (09/07/2018), Chronic upper extremity pain (Secondary Area of Pain) (Bilateral) (R>L)  (09/27/2018), Disorder of skeletal system (09/07/2018), E. coli UTI (  urinary tract infection) (06/20/2020), Elevated sed rate (09/08/2018), Elevated serum creatinine (06/20/2020), Excessive daytime sleepiness (03/06/2020), Female pelvic pain (09/05/2020), GERD (gastroesophageal reflux disease), Headache, History of allergy  to shellfish (09/28/2018), History of fusion of cervical spine (ACDF C4-C7) (09/27/2018), History of illicit drug use (03/07/2020), Hives (09/05/2020), Hives (09/05/2020), Hyperkalemia (05/31/2018), Hyperlipidemia (04/30/2022), Hypertension, Large breasts (05/30/2020), Left hip pain (07/21/2022), Lumbar facet arthropathy (04/20/2019), Lumbar radiculitis (L5 dermatome) (Right) (04/17/2020), Multiple environmental allergies (01/17/2020), Multiple food allergies (03/07/2020), Neurogenic pain (10/27/2018), Numbness and tingling of both feet (05/31/2018), 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), Other intervertebral disc degeneration, 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), Pharmacologic therapy (09/07/2018), Plantar fasciitis, bilateral (03/04/2021), Polyneuropathy (01/29/2021), Prediabetes (10/10/2019), Problems influencing health status (09/07/2018), Rash (08/08/2019), Seasonal allergic rhinitis due to pollen (11/29/2018), Seasonal allergies (03/06/2020), Seasonal asthma (11/29/2018), Sjogren's disease (HCC), Sleep apnea, Somatic dysfunction of sacroiliac joint (Bilateral) (09/27/2018), Spondylosis without myelopathy or radiculopathy, cervical region (03/22/2019), Spondylosis without myelopathy or radiculopathy, lumbosacral region (05/19/2019), Spondylosis, cervical, 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 Surgical: Ms. Brandvold  has a past surgical history that includes Knee arthroscopy (Right); Anterior cervical decomp/discectomy fusion (N/A, 07/01/2017); Abdominal hysterectomy (10/17/2020); and Carpal tunnel release (Bilateral). Family: family history includes  [name] in her mother; Arthritis in her father, mother, and sister; Breast cancer in her maternal aunt and paternal aunt; COPD in her brother and son; Cancer in her brother; Depression in her son; Diabetes in her mother; Early death in her brother; Hearing loss in her father; Hyperlipidemia in her mother; Hypertension in her father, mother, and sister; Kidney disease in her father; Lymphoma in her father; Post-traumatic stress disorder in her brother and son; Sleep apnea in her daughter.  Laboratory Chemistry Profile   Renal Lab Results  Component Value Date   BUN 16 03/16/2024   CREATININE 0.95 03/16/2024   BCR NOT APPLICABLE 02/13/2021   GFR 69.78 03/16/2024   GFRAA 76 09/07/2018   GFRNONAA >60 11/18/2023    Hepatic Lab Results  Component Value Date   AST 12 (L) 11/18/2023   ALT 12 11/18/2023   ALBUMIN 3.9 11/18/2023   ALKPHOS 45 11/18/2023   LIPASE 32.0 06/30/2022    Electrolytes Lab Results  Component Value Date   NA 138 03/16/2024   K 3.7 03/16/2024   CL 104 03/16/2024   CALCIUM  9.3 03/16/2024   MG 2.2 09/07/2018    Bone Lab Results  Component Value Date   VD25OH 29.35 (L) 04/08/2023   25OHVITD1 8.1 (L) 09/07/2018   25OHVITD2 <1.0 09/07/2018   25OHVITD3 8.1 09/07/2018    Inflammation (CRP: Acute Phase) (ESR: Chronic Phase) Lab Results  Component Value Date   CRP <1.0 07/22/2023   ESRSEDRATE 57 (H) 07/22/2023         Note: Above Lab results reviewed.  Recent Imaging Review  NCV with EMG(electromyography) Tobie Tonita POUR, DO     03/10/2024 12:37 PM  [facility]  [address] [place], [address]  [place], [place] [address] Tel: [phone] Fax: [phone] Test Date:  03/10/2024  Patient: Arsema Tusing DOB: Sep 17, 1970 Physician: Tonita Tobie, DO   Sex: Female Height: 5' 3 Ref Phys: Glade Boys, DEVONNA  ID#: [id number]   Technician:    History: This is a 53 year old female s/p C4-C7 ACDF (2018) referred for  evaluation of bilateral arm pain and  paresthesias.   NCV & EMG Findings: Extensive electrodiagnostic testing of the right upper extremity  and additional studies of the left shows:  Bilateral median, ulnar, and mixed palmar sensory responses are  within normal limits. Bilateral median and ulnar motor responses are within normal  limits. There is no evidence of active or chronic motor axonal loss  changes affecting any of the tested muscles.  Motor unit  configuration and recruitment pattern is within normal limits.  Impression: This is a normal study of the upper extremities.  In particular,  there is no evidence of carpal tunnel syndrome or a cervical  radiculopathy.  ___________________________ Tonita Blanch, DO  Nerve Conduction Studies   Stim Site NR Peak (ms) Norm Peak (ms) O-P Amp (V) Norm O-P Amp  Left Median Anti Sensory (2nd Digit)  32 C  Wrist    2.9 <3.6 35.9 >15  Right Median Anti Sensory (2nd Digit)  32 C  Wrist    3.2 <3.6 29.0 >15  Left Ulnar Anti Sensory (5th Digit)  32 C  Wrist    2.8 <3.1 37.2 >10  Right Ulnar Anti Sensory (5th Digit)  32 C  Wrist    2.9 <3.1 39.1 >10    Stim Site NR Onset (ms) Norm Onset (ms) O-P Amp (mV) Norm O-P  Amp Site1 Site2 Delta-0 (ms) Dist (cm) Vel (m/s) Norm Vel (m/s)  Left Median Motor (Abd Poll Brev)  32 C  Wrist    2.9 <4.0 11.7 >6 Elbow Wrist 4.9 28.0 57 >50  Elbow    7.8  11.6         Right Median Motor (Abd Poll Brev)  32 C  Wrist    3.2 <4.0 11.9 >6 Elbow Wrist 4.6 29.0 63 >50  Elbow    7.8  11.0         Left Ulnar Motor (Abd Dig Minimi)  32 C  Wrist    2.7 <3.1 9.3 >7 B Elbow Wrist 3.9 20.0 51 >50  B Elbow    6.6  9.2  A Elbow B Elbow 1.8 10.0 56 >50  A Elbow    8.4  8.9         Right Ulnar Motor (Abd Dig Minimi)  32 C  Wrist    2.6 <3.1 9.1 >7 B Elbow Wrist 4.2 23.0 55 >50  B Elbow    6.8  8.7  A Elbow B Elbow 1.8 10.0 56 >50  A Elbow    8.6  8.2           Stim Site NR Peak (ms) Norm Peak (ms) P-T Amp (V) Site1 Site2  Delta-P (ms) Norm  Delta (ms)  Left Median/Ulnar Palm Comparison (Wrist - 8cm)  32 C  Median Palm    1.5 <2.2 49.6 Median Palm Ulnar Palm 0.2   Ulnar Palm    1.7 <2.2 11.6      Right Median/Ulnar Palm Comparison (Wrist - 8cm)  32 C  Median Palm    1.8 <2.2 45.9 Median Palm Ulnar Palm 0.1   Ulnar Palm    1.7 <2.2 13.0       Electromyography   Side Muscle Ins.Act Fibs Fasc Recrt Amp Dur Poly Activation  Comment  Right 1stDorInt Nml Nml Nml Nml Nml Nml Nml Nml N/A  Right PronatorTeres Nml Nml Nml Nml Nml Nml Nml Nml N/A  Right Biceps Nml Nml Nml Nml Nml Nml Nml Nml N/A  Right Triceps Nml Nml Nml Nml Nml Nml Nml Nml N/A  Right Deltoid Nml Nml Nml Nml Nml Nml Nml Nml N/A  Left 1stDorInt Nml Nml Nml Nml Nml Nml Nml Nml N/A  Left PronatorTeres Nml Nml Nml Nml  Nml Nml Nml Nml N/A  Left Biceps Nml Nml Nml Nml Nml Nml Nml Nml N/A  Left Triceps Nml Nml Nml Nml Nml Nml Nml Nml N/A  Left Deltoid Nml Nml Nml Nml Nml Nml Nml Nml N/A   Waveforms:                        Note: Reviewed        Physical Exam  Vitals: BP 119/60 (BP Location: Right Arm, Patient Position: Sitting)   Pulse 63   Temp (!) 97.3 F (36.3 C) (Temporal)   Resp 18   Ht 5' 3 (1.6 m)   Wt 246 lb (111.6 kg)   LMP  (LMP Unknown)   SpO2 100%   BMI 43.58 kg/m  BMI: Estimated body mass index is 43.58 kg/m as calculated from the following:   Height as of this encounter: 5' 3 (1.6 m).   Weight as of this encounter: 246 lb (111.6 kg). Ideal: Ideal body weight: 52.4 kg (115 lb 8.3 oz) Adjusted ideal body weight: 76.1 kg (167 lb 11.4 oz) General appearance: Well nourished, well developed, and well hydrated. In no apparent acute distress Mental status: Alert, oriented x 3 (person, place, & time)       Respiratory: No evidence of acute respiratory distress Eyes: PERLA   Assessment   Diagnosis Status  1. Chronic musculoskeletal pain   2. Chronic pain syndrome   3. Neurogenic pain   4. Bilateral occipital neuralgia   5. Lumbar  facet joint syndrome   6. Lumbar facet arthropathy    Controlled Controlled Controlled   Updated Problems: No problems updated.  Plan of Care  Problem-specific:  Assessment and Plan We will continue on Butrans  patch methocarbamol  (Robaxin ) for pain management.  Prescribing drug monitoring (PDMP) reviewed; findings consistent with the use of prescribed medication.  Scheduled in 30 days for medication management.    Ms. MURLINE WEIGEL has a current medication list which includes the following long-term medication(s): albuterol , amlodipine , atorvastatin , cetirizine , dicyclomine , duloxetine , fluticasone , losartan , methocarbamol , montelukast , pantoprazole , potassium chloride  sa, trazodone , and pregabalin .  Pharmacotherapy (Medications Ordered): Meds ordered this encounter  Medications   pregabalin  (LYRICA ) 100 MG capsule    Sig: Take 1 capsule (100 mg total) by mouth 3 (three) times daily.    Dispense:  90 capsule    Refill:  2    Fill one day early if pharmacy is closed on scheduled refill date. May substitute for generic if available.   buprenorphine  (BUTRANS ) 15 MCG/HR    Sig: Place 1 patch onto the skin every 7 (seven) days.    Dispense:  4 patch    Refill:  0   Orders:  No orders of the defined types were placed in this encounter.    Allergies:  Patient is allergic to other, pineapple, pineapple extract, shellfish allergy , strawberry (diagnostic), and tomato.  Labs:  Lab Results  Component Value Date   BUN 10 06/08/2024   CREATININE 0.96 06/08/2024   GFRAA 76 09/07/2018   GFRNONAA 56 (L) 05/31/2024    Assessment:  There were no encounter diagnoses.  Attestation: Medical screening examination/treatment/procedure(s) were performed by non-physician practitioner and as supervising physician I was immediately available for consultation/collaboration.  Plan of Care (POC)  Orders:  No orders of the defined types were placed in this encounter.    Butrans  patch 15  mcg/hr 1 patch onto the skin once a week  Methocarbamol  (Robaxin ) 750 mg tablet every  8 hours as needed for muscle spasm Pregabalin  (Lyrica ) 100 mg capsule 3 times daily for neuropathic pain Monitoring:  PMP: PDMP reviewed during this encounter.       Pharmacotherapy: No side-effects or adverse reactions reported. Compliance: No problems identified. Effectiveness: Clinically acceptable.  Erlene Doyal Gibbs, [place]  05/05/2024  9:18 AM  Sign when Signing Visit Safety precautions to be maintained throughout the outpatient stay will include: orient to surroundings, keep bed in low position, maintain call bell within reach at all times, provide assistance with transfer out of bed and ambulation.     UDS:  Summary  Date Value Ref Range Status  02/01/2020 Note  Final    Comment:    ==================================================================== [employer] Select 13 (MW) ==================================================================== Test                             Result       Flag       Units Drug Present not Declared for Prescription Verification   Carboxy-THC                    20           UNEXPECTED ng/mg creat    Carboxy-THC is a metabolite of tetrahydrocannabinol (THC). Source of    THC is most commonly herbal marijuana or marijuana-based products,    but THC is also present in a scheduled prescription medication.    Trace amounts of THC can be present in hemp and cannabidiol (CBD)    products. This test is not intended to distinguish between delta-9-    tetrahydrocannabinol, the predominant form of THC in most herbal or    marijuana-based products, and delta-8-tetrahydrocannabinol. Drug Absent but Declared for Prescription Verification   Oxycodone                       Not Detected UNEXPECTED ng/mg creat ==================================================================== Test                      Result    Flag   Units      Ref Range   Creatinine              230               mg/dL      >=79 ==================================================================== Declared Medications:  The flagging and interpretation on this report are based on the  following declared medications.  Unexpected results may arise from  inaccuracies in the declared medications.  **Note: The testing scope of this panel includes these medications:  Oxycodone  (Roxicodone )  **Note: The testing scope of this panel does not include the  following reported medications:  Albuterol  (Ventolin  HFA)  Amlodipine  (Norvasc )  Atorvastatin  (Lipitor)  Cetirizine   Diphenhydramine (Benadryl)  Epinephrine  (EpiPen )  Fluticasone  (Flonase )  Gabapentin  (Neurontin )  Hydrochlorothiazide   Losartan  (Cozaar )  Meloxicam  (Mobic )  Methocarbamol  (Robaxin )  Montelukast  (Singulair )  Norethindrone (Aygestin)  Pantoprazole  (Protonix )  Vitamin D3 ==================================================================== For clinical consultation, please call [phone]. ====================================================================     No results found for: CBDTHCR No results found for: D8THCCBX No results found for: D9THCCBX  ROS  Constitutional: Denies any fever or chills Gastrointestinal: No reported hemesis, hematochezia, vomiting, or acute GI distress Musculoskeletal: Denies any acute onset joint swelling, redness, loss of ROM, or weakness Neurological: No reported episodes of acute onset apraxia, aphasia, dysarthria, agnosia, amnesia, paralysis, loss of coordination, or loss of consciousness  Medication Review  Cyanocobalamin , DULoxetine , EPINEPHrine , Melatonin, Vitamin D -3, albuterol , amLODipine , atorvastatin , buprenorphine , busPIRone , butalbital -acetaminophen -caffeine , cetirizine , dicyclomine , fluticasone , hydrOXYzine , hydrochlorothiazide , hydrocortisone , ibuprofen, losartan , methocarbamol , montelukast , pantoprazole , polyethylene glycol powder, potassium chloride  SA, pregabalin ,  saccharomyces boulardii, tirzepatide , traMADol , traZODone , and triamcinolone  cream  History Review  Allergy : Ms. Mcmullan is allergic to other, pineapple, pineapple extract, shellfish allergy , strawberry (diagnostic), and tomato. Drug: Ms. Decock  reports no history of drug use. Alcohol:  reports current alcohol use of about 2.0 standard drinks of alcohol per week. Tobacco:  reports that she has never smoked. She has never used smokeless tobacco. Social: Ms. Gfeller  reports that she has never smoked. She has never used smokeless tobacco. She reports current alcohol use of about 2.0 standard drinks of alcohol per week. She reports that she does not use drugs. 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), Adrenal nodule (HCC) (06/20/2020), Allergy , ANA positive (02/01/2021), Anxiety and depression (01/29/2021), Arthritis, Back pain with history of spinal surgery (01/29/2021), Bilateral carpal tunnel syndrome (01/29/2021), Bilateral leg edema (01/29/2021), BMI 40.0-44.9, adult (HCC) (04/30/2022), Carpal boss, right, Carpal tunnel syndrome, bilateral, 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) (09/27/2018), Cervical facet hypertrophy (C3-T1) (09/27/2018), Cervical facet joint syndrome (Bilateral) (L>R) (09/27/2018), 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-5, C5-6, and C6-7) (09/27/2018), Cervical myelopathy (HCC) (07/01/2017), Cervicalgia (Primary Area of Pain) (Bilateral) (L>R) (09/27/2018), Chronic gout of foot (Left) (05/31/2018), Chronic low back pain (Tertiary Area of Pain) (Bilateral) (R>L) w/ sciatica (Bilateral) (09/07/2018), Chronic low back pain (Tertiary Area of Pain) (Bilateral) (R>L) w/o sciatica (07/26/2018), Chronic lower extremity pain (Fourth Area of  Pain) (Bilateral) (R>L) (09/07/2018), Chronic musculoskeletal pain (10/27/2018), Chronic neck pain (Bilateral) w/ history of cervical spinal surgery (09/27/2018), Chronic neck pain (Primary Area of Pain) (Bilateral) (L>R) (09/07/2018), Chronic sacroiliac joint dysfunction (Bilateral) (09/27/2018), Chronic sacroiliac joint pain (Right) (09/07/2018), Chronic upper extremity pain (Secondary Area of Pain) (Bilateral) (R>L) (09/27/2018), Disorder of skeletal system (09/07/2018), E. coli UTI (urinary tract infection) (06/20/2020), Elevated sed rate (09/08/2018), Elevated serum creatinine (06/20/2020), Excessive daytime sleepiness (03/06/2020), Female pelvic pain (09/05/2020), GERD (gastroesophageal reflux disease), Headache, History of allergy  to shellfish (09/28/2018), History of fusion of cervical spine (ACDF C4-C7) (09/27/2018), History of illicit drug use (03/07/2020), Hives (09/05/2020), Hives (09/05/2020), Hyperkalemia (05/31/2018), Hyperlipidemia (04/30/2022), Hypertension, Large breasts (05/30/2020), Left hip pain (07/21/2022), Lumbar facet arthropathy (04/20/2019), Lumbar radiculitis (L5 dermatome) (Right) (04/17/2020), Multiple environmental allergies (01/17/2020), Multiple food allergies (03/07/2020), Neurogenic pain (10/27/2018), Numbness and tingling of both feet (05/31/2018), 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), Other intervertebral disc degeneration, 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), Pharmacologic therapy (09/07/2018), Plantar fasciitis, bilateral (03/04/2021), Polyneuropathy (01/29/2021), Prediabetes (10/10/2019), Problems influencing health status (09/07/2018), Rash (08/08/2019), Seasonal allergic rhinitis due to pollen (11/29/2018), Seasonal allergies (03/06/2020), Seasonal asthma (11/29/2018), Sjogren's disease (HCC), Sleep apnea, Somatic dysfunction of sacroiliac joint (Bilateral) (09/27/2018), Spondylosis without myelopathy or radiculopathy, cervical region (03/22/2019), Spondylosis  without myelopathy or radiculopathy, lumbosacral region (05/19/2019), Spondylosis, cervical, 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 Surgical: Ms. Leitch  has a past surgical history that includes Knee arthroscopy (Right); Anterior cervical decomp/discectomy fusion (N/A, 07/01/2017); Abdominal hysterectomy (10/17/2020); and Carpal tunnel release (Bilateral). Family: family history includes [name] in her mother; Arthritis in her father, mother, and sister; Breast cancer in her maternal aunt and paternal aunt; COPD in her brother and son; Cancer in her brother; Depression in her son; Diabetes in her mother;  Early death in her brother; Hearing loss in her father; Hyperlipidemia in her mother; Hypertension in her father, mother, and sister; Kidney disease in her father; Lymphoma in her father; Post-traumatic stress disorder in her brother and son; Sleep apnea in her daughter.  Laboratory Chemistry Profile   Renal Lab Results  Component Value Date   BUN 16 03/16/2024   CREATININE 0.95 03/16/2024   BCR NOT APPLICABLE 02/13/2021   GFR 69.78 03/16/2024   GFRAA 76 09/07/2018   GFRNONAA >60 11/18/2023    Hepatic Lab Results  Component Value Date   AST 12 (L) 11/18/2023   ALT 12 11/18/2023   ALBUMIN 3.9 11/18/2023   ALKPHOS 45 11/18/2023   LIPASE 32.0 06/30/2022    Electrolytes Lab Results  Component Value Date   NA 138 03/16/2024   K 3.7 03/16/2024   CL 104 03/16/2024   CALCIUM  9.3 03/16/2024   MG 2.2 09/07/2018    Bone Lab Results  Component Value Date   VD25OH 29.35 (L) 04/08/2023   25OHVITD1 8.1 (L) 09/07/2018   25OHVITD2 <1.0 09/07/2018   25OHVITD3 8.1 09/07/2018    Inflammation (CRP: Acute Phase) (ESR: Chronic Phase) Lab Results  Component Value Date   CRP <1.0 07/22/2023   ESRSEDRATE 57 (H) 07/22/2023         Note: Above Lab results reviewed.  Recent Imaging Review  NCV with EMG(electromyography) Tobie Tonita POUR, DO     03/10/2024 12:37  PM  [facility]  [address] [place], [address]  [place], [place] [address] Tel: [phone] Fax: [phone] Test Date:  03/10/2024  Patient: Kaavya Puskarich DOB: 1971-06-09 Physician: Tonita Tobie, DO   Sex: Female Height: 5' 3 Ref Phys: Glade Boys, DEVONNA  ID#: [id number]   Technician:    History: This is a 53 year old female s/p C4-C7 ACDF (2018) referred for  evaluation of bilateral arm pain and paresthesias.   NCV & EMG Findings: Extensive electrodiagnostic testing of the right upper extremity  and additional studies of the left shows:  Bilateral median, ulnar, and mixed palmar sensory responses are  within normal limits. Bilateral median and ulnar motor responses are within normal  limits. There is no evidence of active or chronic motor axonal loss  changes affecting any of the tested muscles.  Motor unit  configuration and recruitment pattern is within normal limits.  Impression: This is a normal study of the upper extremities.  In particular,  there is no evidence of carpal tunnel syndrome or a cervical  radiculopathy.  ___________________________ Tonita Tobie, DO  Nerve Conduction Studies   Stim Site NR Peak (ms) Norm Peak (ms) O-P Amp (V) Norm O-P Amp  Left Median Anti Sensory (2nd Digit)  32 C  Wrist    2.9 <3.6 35.9 >15  Right Median Anti Sensory (2nd Digit)  32 C  Wrist    3.2 <3.6 29.0 >15  Left Ulnar Anti Sensory (5th Digit)  32 C  Wrist    2.8 <3.1 37.2 >10  Right Ulnar Anti Sensory (5th Digit)  32 C  Wrist    2.9 <3.1 39.1 >10    Stim Site NR Onset (ms) Norm Onset (ms) O-P Amp (mV) Norm O-P  Amp Site1 Site2 Delta-0 (ms) Dist (cm) Vel (m/s) Norm Vel (m/s)  Left Median Motor (Abd Poll Brev)  32 C  Wrist    2.9 <4.0 11.7 >6 Elbow Wrist 4.9 28.0 57 >50  Elbow    7.8  11.6  Right Median Motor (Abd Poll Brev)  32 C  Wrist    3.2 <4.0 11.9 >6 Elbow Wrist 4.6 29.0 63 >50  Elbow    7.8  11.0         Left Ulnar Motor (Abd Dig Minimi)   32 C  Wrist    2.7 <3.1 9.3 >7 B Elbow Wrist 3.9 20.0 51 >50  B Elbow    6.6  9.2  A Elbow B Elbow 1.8 10.0 56 >50  A Elbow    8.4  8.9         Right Ulnar Motor (Abd Dig Minimi)  32 C  Wrist    2.6 <3.1 9.1 >7 B Elbow Wrist 4.2 23.0 55 >50  B Elbow    6.8  8.7  A Elbow B Elbow 1.8 10.0 56 >50  A Elbow    8.6  8.2           Stim Site NR Peak (ms) Norm Peak (ms) P-T Amp (V) Site1 Site2  Delta-P (ms) Norm Delta (ms)  Left Median/Ulnar Palm Comparison (Wrist - 8cm)  32 C  Median Palm    1.5 <2.2 49.6 Median Palm Ulnar Palm 0.2   Ulnar Palm    1.7 <2.2 11.6      Right Median/Ulnar Palm Comparison (Wrist - 8cm)  32 C  Median Palm    1.8 <2.2 45.9 Median Palm Ulnar Palm 0.1   Ulnar Palm    1.7 <2.2 13.0       Electromyography   Side Muscle Ins.Act Fibs Fasc Recrt Amp Dur Poly Activation  Comment  Right 1stDorInt Nml Nml Nml Nml Nml Nml Nml Nml N/A  Right PronatorTeres Nml Nml Nml Nml Nml Nml Nml Nml N/A  Right Biceps Nml Nml Nml Nml Nml Nml Nml Nml N/A  Right Triceps Nml Nml Nml Nml Nml Nml Nml Nml N/A  Right Deltoid Nml Nml Nml Nml Nml Nml Nml Nml N/A  Left 1stDorInt Nml Nml Nml Nml Nml Nml Nml Nml N/A  Left PronatorTeres Nml Nml Nml Nml Nml Nml Nml Nml N/A  Left Biceps Nml Nml Nml Nml Nml Nml Nml Nml N/A  Left Triceps Nml Nml Nml Nml Nml Nml Nml Nml N/A  Left Deltoid Nml Nml Nml Nml Nml Nml Nml Nml N/A   Waveforms:                        Note: Reviewed        Physical Exam  Vitals: BP 119/60 (BP Location: Right Arm, Patient Position: Sitting)   Pulse 63   Temp (!) 97.3 F (36.3 C) (Temporal)   Resp 18   Ht 5' 3 (1.6 m)   Wt 246 lb (111.6 kg)   LMP  (LMP Unknown)   SpO2 100%   BMI 43.58 kg/m  BMI: Estimated body mass index is 43.58 kg/m as calculated from the following:   Height as of this encounter: 5' 3 (1.6 m).   Weight as of this encounter: 246 lb (111.6 kg). Ideal: Ideal body weight: 52.4 kg (115 lb 8.3 oz) Adjusted ideal body weight: 76.1 kg (167  lb 11.4 oz) General appearance: Well nourished, well developed, and well hydrated. In no apparent acute distress Mental status: Alert, oriented x 3 (person, place, & time)       Respiratory: No evidence of acute respiratory distress Eyes: PERLA   Assessment   Diagnosis Status  1. Chronic musculoskeletal pain  2. Chronic pain syndrome   3. Neurogenic pain   4. Bilateral occipital neuralgia   5. Lumbar facet joint syndrome   6. Lumbar facet arthropathy    Controlled Controlled Controlled   Updated Problems: No problems updated.  Plan of Care  Problem-specific:  Assessment and Plan We will continue on Butrans  patch methocarbamol  (Robaxin ) for pain management.  Prescribing drug monitoring (PDMP) reviewed; findings consistent with the use of prescribed medication.  Scheduled in 30 days for medication management.    Ms. MARCHELL FROMAN has a current medication list which includes the following long-term medication(s): albuterol , amlodipine , atorvastatin , cetirizine , dicyclomine , duloxetine , fluticasone , losartan , methocarbamol , montelukast , pantoprazole , potassium chloride  sa, trazodone , and pregabalin .  Pharmacotherapy (Medications Ordered): Meds ordered this encounter  Medications   pregabalin  (LYRICA ) 100 MG capsule    Sig: Take 1 capsule (100 mg total) by mouth 3 (three) times daily.    Dispense:  90 capsule    Refill:  2    Fill one day early if pharmacy is closed on scheduled refill date. May substitute for generic if available.   buprenorphine  (BUTRANS ) 15 MCG/HR    Sig: Place 1 patch onto the skin every 7 (seven) days.    Dispense:  4 patch    Refill:  0   Orders:  No orders of the defined types were placed in this encounter.    Medications ordered for procedure: No orders of the defined types were placed in this encounter.  Medications administered: Sheniqua Carolan. Rahn had no medications administered during this visit.  See the medical record for exact dosing,  route, and time of administration.    {There is no content from the last Plan section.}    Follow-up plan:   No follow-ups on file.     Recent Visits Date Type Provider Dept  05/05/24 Office Visit Camrynn Mcclintic K, NP [facility]  04/13/24 Procedure visit Marcelino Nurse, MD [facility]  04/07/24 Office Visit Champion Corales K, NP [facility]  03/22/24 Office Visit Marcelino Nurse, MD [facility]  Showing recent visits within past 90 days and meeting all other requirements Today's Visits Date Type Provider Dept  06/13/24 Office Visit Kerigan Narvaez K, NP [facility]  Showing today's visits and meeting all other requirements Future Appointments Date Type Provider Dept  08/01/24 Appointment Raylen Tangonan K, NP [facility]  Showing future appointments within next 90 days and meeting all other requirements   Disposition: {Blank single:19197::Transfer back to floor,Discharge home under the care of a responsible, capable, adult driver,Discharge home}  Discharge (Date  Time): 06/13/2024;   hrs.   Primary Care Physician: Gretel App, NP Location: [facility] Note by: Holbert Caples K Tabari Volkert, NP (TTS technology used. I apologize for any typographical errors that were not detected and corrected.) Date: 06/13/2024; Time: 3:08 PM  Disclaimer:  Medicine is not an Visual merchandiser. The only guarantee in medicine is that nothing is guaranteed. It is important to note that the decision to proceed with this intervention was based on the information collected from the patient. The Data and conclusions were drawn from the patient's questionnaire, the interview, and the physical examination. Because the information was provided in large part by the patient, it cannot be guaranteed that it has not been purposely or unconsciously manipulated. Every effort has been made to obtain as much relevant data as possible for this evaluation.  It is important to note that the conclusions that lead to this  procedure are derived in large part from the available data. Always take into account that the treatment will also be dependent on availability of resources and existing treatment guidelines, considered by other Pain Management Practitioners as being common knowledge and practice, at the time of the intervention. For Medico-Legal purposes, it is also important to point out that variation in procedural techniques and pharmacological choices are the acceptable norm. The indications, contraindications, technique, and results of the above procedure should only be interpreted and judged by a Board-Certified Interventional Pain Specialist with extensive familiarity and expertise in the same exact procedure and technique.

## 2024-06-13 NOTE — Progress Notes (Signed)
 PROVIDER NOTE: Interpretation of information contained herein should be left to medically-trained personnel. Specific patient instructions are provided elsewhere under Patient Instructions section of medical record. This document was created in part using AI and STT-dictation technology, any transcriptional errors that may result from this process are unintentional.  Patient: Anita Gibbs  Service: E/M   PCP: Anita App, NP  DOB: 04-01-1971  DOS: 06/13/2024  Provider: Emmy MARLA Blanch, NP  MRN: [id number]  Delivery: Virtual Visit  Specialty: Interventional Pain Management  Type: Established Patient  Setting: Ambulatory outpatient facility  Specialty designation: 09  Referring Prov.: Anita App, NP  Location: Remote location       Virtual Encounter - Pain Management PROVIDER NOTE: Information contained herein reflects review and annotations entered in association with encounter. Interpretation of such information and data should be left to medically-trained personnel. Information provided to patient can be located elsewhere in the medical record under Patient Instructions. Document created using STT-dictation technology, any transcriptional errors that may result from process are unintentional.    Contact & Pharmacy Preferred: [phone] Home: [phone] (home) Mobile: [phone] (mobile) E-mail: [email or web address]  [facility] - [place], [place] - [address] [place] [address] Phone: [phone] Fax: [phone]   Pre-screening  Anita Gibbs offered in-person vs virtual encounter. She indicated preferring virtual for this encounter.   Reason COVID-19*  Social distancing based on CDC and AMA recommendations.   I contacted Anita Gibbs on 06/13/2024 via telephone.      I clearly identified myself as Anita MARLA Blanch, NP. I verified that I was speaking with the correct person using two identifiers (Name: Anita Gibbs, and date of birth:  18-Jul-1971).  Consent I sought verbal advanced consent from Anita Gibbs for virtual visit interactions. I informed Anita Gibbs of possible security and privacy concerns, risks, and limitations associated with providing not-in-person medical evaluation and management services. I also informed Anita Gibbs of the availability of in-person appointments. Finally, I informed her that there would be a charge for the virtual visit and that she could be  personally, fully or partially, financially responsible for it. Anita Gibbs expressed understanding and agreed to proceed.   Historic Elements   Anita Gibbs is a 53 y.o. year old, female patient evaluated today after our last contact on 05/05/2024. 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), Adrenal nodule (06/20/2020), Allergy , ANA positive (02/01/2021), Anxiety and depression (01/29/2021), Arthritis, Back pain with history of spinal surgery (01/29/2021), Bilateral carpal tunnel syndrome (01/29/2021), Bilateral leg edema (01/29/2021), BMI 40.0-44.9, adult (HCC) (04/30/2022), Carpal boss, right, Carpal tunnel syndrome, bilateral, 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) (09/27/2018), Cervical facet hypertrophy (C3-T1) (09/27/2018), Cervical facet joint syndrome (Bilateral) (L>R) (09/27/2018), 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-5, C5-6, and C6-7) (09/27/2018), Cervical myelopathy (HCC) (07/01/2017), Cervicalgia (Primary Area of Pain) (Bilateral) (L>R) (09/27/2018), Chronic gout of foot (Left) (05/31/2018), Chronic low back pain (Tertiary Area of Pain) (Bilateral) (R>L) w/ sciatica (Bilateral) (09/07/2018), Chronic low back pain (Tertiary Area of Pain) (Bilateral) (R>L) w/o sciatica (07/26/2018),  Chronic lower extremity pain (Fourth Area of Pain) (Bilateral) (R>L) (09/07/2018), Chronic musculoskeletal pain (10/27/2018), Chronic neck pain (Bilateral) w/ history of cervical spinal surgery (09/27/2018), Chronic neck pain (Primary Area of Pain) (Bilateral) (L>R) (09/07/2018), Chronic sacroiliac joint dysfunction (Bilateral) (09/27/2018), Chronic sacroiliac  joint pain (Right) (09/07/2018), Chronic upper extremity pain (Secondary Area of Pain) (Bilateral) (R>L) (09/27/2018), Disorder of skeletal system (09/07/2018), E. coli UTI (urinary tract infection) (06/20/2020), Elevated sed rate (09/08/2018), Elevated serum creatinine (06/20/2020), Excessive daytime sleepiness (03/06/2020), Female pelvic pain (09/05/2020), GERD (gastroesophageal reflux disease), Headache, History of allergy  to shellfish (09/28/2018), History of fusion of cervical spine (ACDF C4-C7) (09/27/2018), History of illicit drug use (03/07/2020), Hives (09/05/2020), Hives (09/05/2020), Hyperkalemia (05/31/2018), Hyperlipidemia (04/30/2022), Hypertension, Gibbs breasts (05/30/2020), Left hip pain (07/21/2022), Lumbar facet arthropathy (04/20/2019), Lumbar radiculitis (L5 dermatome) (Right) (04/17/2020), Multiple environmental allergies (01/17/2020), Multiple food allergies (03/07/2020), Neurogenic pain (10/27/2018), Numbness and tingling of both feet (05/31/2018), 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), Other intervertebral disc degeneration, 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), Pharmacologic therapy (09/07/2018), Plantar fasciitis, bilateral (03/04/2021), Polyneuropathy (01/29/2021), Prediabetes (10/10/2019), Problems influencing health status (09/07/2018), Rash (08/08/2019), Seasonal allergic rhinitis due to pollen (11/29/2018), Seasonal allergies (03/06/2020), Seasonal asthma (11/29/2018), Sjogren's disease, Sleep apnea, Somatic dysfunction of sacroiliac joint (Bilateral) (09/27/2018), Spondylosis without myelopathy or radiculopathy,  cervical region (03/22/2019), Spondylosis without myelopathy or radiculopathy, lumbosacral region (05/19/2019), Spondylosis, cervical, 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 She also  has a past surgical history that includes Knee arthroscopy (Right); Anterior cervical decomp/discectomy fusion (N/A, 07/01/2017); Abdominal hysterectomy (10/17/2020); and Carpal tunnel release (Bilateral). Ms. Narine has a current medication list which includes the following prescription(s): albuterol , amlodipine , atorvastatin , buspirone , butalbital -acetaminophen -caffeine , cetirizine , vitamin d -3, cyanocobalamin , dicyclomine , duloxetine , enbrel sureclick, epinephrine , famotidine , fluticasone , hydrochlorothiazide , hydrocortisone , hydroxyzine , losartan , melatonin, methocarbamol , montelukast , ondansetron , pantoprazole , polyethylene glycol powder, potassium chloride  sa, pregabalin , saccharomyces boulardii, tramadol , trazodone , triamcinolone  cream, zepbound , and metronidazole . She  reports that she has never smoked. She has never used smokeless tobacco. She reports current alcohol use of about 2.0 standard drinks of alcohol per week. She reports that she does not use drugs. Ms. Hinojosa is allergic to other, pineapple, pineapple extract, shellfish allergy , strawberry (diagnostic), and tomato.  BMI: Estimated body mass index is 43.4 kg/m as calculated from the following:   Height as of 06/08/24: 5' 3 (1.6 m).   Weight as of 06/08/24: 245 lb (111.1 kg). Last encounter: 05/05/2024. Last procedure: Visit date not found.  HPI  Today, she is being contacted for medication management. No change in medical history since last visit.  Patient's pain is at baseline.  Patient continues multimodal pain regimen as prescribed.  States that it provides pain relief and improvement in functional status.  The patient continues experiencing low back pain, however tramadol  manage her functional ability and control her pain  level. Pharmacotherapy Assessment  Tramadol  (Ultram -ER) 100 mg 24-hour tablet daily for pain. Monitoring: [place] PMP: PDMP reviewed during this encounter.       Pharmacotherapy: No side-effects or adverse reactions reported. Compliance: No problems identified. Effectiveness: Clinically acceptable. Plan: Refer to POC.  UDS:  Summary  Date Value Ref Range Status  02/01/2020 Note  Final    Comment:    ==================================================================== [employer] Select 13 (MW) ==================================================================== Test                             Result       Flag       Units Drug Present not Declared for Prescription Verification   Carboxy-THC                    20           UNEXPECTED ng/mg creat    Carboxy-THC is a metabolite of  tetrahydrocannabinol (THC). Source of    THC is most commonly herbal marijuana or marijuana-based products,    but THC is also present in a scheduled prescription medication.    Trace amounts of THC can be present in hemp and cannabidiol (CBD)    products. This test is not intended to distinguish between delta-9-    tetrahydrocannabinol, the predominant form of THC in most herbal or    marijuana-based products, and delta-8-tetrahydrocannabinol. Drug Absent but Declared for Prescription Verification   Oxycodone                       Not Detected UNEXPECTED ng/mg creat ==================================================================== Test                      Result    Flag   Units      Ref Range   Creatinine              230              mg/dL      >=79 ==================================================================== Declared Medications:  The flagging and interpretation on this report are based on the  following declared medications.  Unexpected results may arise from  inaccuracies in the declared medications.  **Note: The testing scope of this panel includes these medications:  Oxycodone   (Roxicodone )  **Note: The testing scope of this panel does not include the  following reported medications:  Albuterol  (Ventolin  HFA)  Amlodipine  (Norvasc )  Atorvastatin  (Lipitor)  Cetirizine   Diphenhydramine (Benadryl)  Epinephrine  (EpiPen )  Fluticasone  (Flonase )  Gabapentin  (Neurontin )  Hydrochlorothiazide   Losartan  (Cozaar )  Meloxicam  (Mobic )  Methocarbamol  (Robaxin )  Montelukast  (Singulair )  Norethindrone (Aygestin)  Pantoprazole  (Protonix )  Vitamin D3 ==================================================================== For clinical consultation, please call [phone]. ====================================================================    No results found for: CBDTHCR, D8THCCBX, D9THCCBX  Laboratory Chemistry Profile   Renal Lab Results  Component Value Date   BUN 10 06/08/2024   CREATININE 0.96 06/08/2024   BCR NOT APPLICABLE 02/13/2021   GFR 68.79 06/08/2024   GFRAA 76 09/07/2018   GFRNONAA 56 (L) 05/31/2024    Hepatic Lab Results  Component Value Date   AST 15 06/08/2024   ALT 13 06/08/2024   ALBUMIN 4.3 06/08/2024   ALKPHOS 50 06/08/2024   LIPASE 37.0 06/08/2024    Electrolytes Lab Results  Component Value Date   NA 140 06/08/2024   K 3.4 (L) 06/08/2024   CL 102 06/08/2024   CALCIUM  9.0 06/08/2024   MG 2.2 09/07/2018    Bone Lab Results  Component Value Date   VD25OH 29.35 (L) 04/08/2023   25OHVITD1 8.1 (L) 09/07/2018   25OHVITD2 <1.0 09/07/2018   25OHVITD3 8.1 09/07/2018    Inflammation (CRP: Acute Phase) (ESR: Chronic Phase) Lab Results  Component Value Date   CRP <1.0 07/22/2023   ESRSEDRATE 57 (H) 07/22/2023         Note: Above Lab results reviewed.  Imaging  MR THORACIC SPINE WO CONTRAST CLINICAL DATA:  Provided history: Status post cervical spinal fusion. Cervical spondylosis. Cervical radiculopathy. Hyperreflexia. Evaluation for dexterity/balance issues and hyperreflexia.  EXAM: MRI THORACIC SPINE WITHOUT  CONTRAST  TECHNIQUE: Multiplanar, multisequence MR imaging of the thoracic spine was performed. No intravenous contrast was administered.  COMPARISON:  Thoracic spine radiographs 01/02/2021.  FINDINGS: Alignment:  No significant spondylolisthesis.  Vertebrae: Thoracic vertebral body height is maintained. 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 19 mm L2 vertebral body  hemangioma.  Cord: No signal abnormality identified within the thoracic spinal cord.  Paraspinal and other soft tissues: 2.1 cm T2 hyperintense upper pole left renal lesion consistent with a cyst (for which no imaging follow-up is required). No paraspinal mass or collection.  Disc levels:  Mild multilevel disc degeneration.  Slight disc bulge without spinal canal stenosis at T1-T2.  No significant disc herniation or spinal canal stenosis at the remaining thoracic levels. No significant foraminal stenosis within the thoracic spine.  The cervical spine is incompletely assessed on localizer imaging. Prior C4-C7 ACDF.  IMPRESSION: 1. Unremarkable MRI appearance of the thoracic spinal cord. 2. Mild thoracic spondylosis as described within the body of the report. No significant spinal canal or foraminal stenosis. 3. Cervical spine incompletely assessed on localizer imaging. Prior C4-C7 ACDF.  Electronically Signed   By: Rockey Childs D.O.   On: 06/10/2024 12:40  Assessment  The primary encounter diagnosis was Chronic pain syndrome. Diagnoses of Neurogenic pain, Chronic musculoskeletal pain, Lumbar facet joint syndrome, Lumbar facet arthropathy, and Medication management were also pertinent to this visit.  Plan of Care  Problem-specific:   Chronic pain syndrome: Patient's pain is well-controlled with tramadol , will continue on current medication regimen.  Prescribing drug monitoring (PDMP) reviewed, findings consistent with the use of prescribed  medication and no evidence of narcotic misuse or abuse.  The patient was advised to continue to walk for at least 30 minutes for better functional mobility.  Schedule face-to-face for medication management in 30 days.  Ms. TAHISHA HAKIM has a current medication list which includes the following long-term medication(s): albuterol , amlodipine , atorvastatin , cetirizine , dicyclomine , duloxetine , enbrel sureclick, famotidine , fluticasone , losartan , methocarbamol , montelukast , pantoprazole , potassium chloride  sa, pregabalin , and trazodone .  Pharmacotherapy (Medications Ordered): Meds ordered this encounter  Medications   traMADol  (ULTRAM -ER) 100 MG 24 hr tablet    Sig: Take 1 tablet (100 mg total) by mouth daily.    Dispense:  30 tablet    Refill:  0   Orders:  Tramadol  Ultram -ER 100 mg 24-hour tablet by mouth daily  Follow-up plan:   Return in about 1 month (around 07/14/2024) for (F2F), (MM), Anita Blanch NP.          Recent Visits Date Type Provider Dept  05/05/24 Office Visit Vanassa Penniman K, NP [facility]  04/13/24 Procedure visit Marcelino Nurse, MD [facility]  04/07/24 Office Visit Danyell Awbrey K, NP [facility]  03/22/24 Office Visit Marcelino Nurse, MD [facility]  Showing recent visits within past 90 days and meeting all other requirements Today's Visits Date Type Provider Dept  06/13/24 Office Visit Almee Pelphrey K, NP [facility]  Showing today's visits and meeting all other requirements Future Appointments Date Type Provider Dept  08/01/24 Appointment Brennyn Haisley K, NP [facility]  Showing future appointments within next 90 days and meeting all other requirements  I discussed the assessment and treatment plan with the patient. The patient was provided an opportunity to ask questions and all were answered. The patient agreed with the plan and demonstrated an understanding of the instructions.  Patient advised to  call back or seek an in-person evaluation if the symptoms or condition worsens.  Duration of encounter: 20 minutes.  Note by: Anita MARLA Blanch, NP Date: 06/13/2024; Time: 3:15 PM

## 2024-06-14 ENCOUNTER — Encounter: Payer: Self-pay | Admitting: Sleep Medicine

## 2024-06-14 ENCOUNTER — Encounter: Payer: Self-pay | Admitting: Orthopedic Surgery

## 2024-06-14 ENCOUNTER — Telehealth: Admitting: Orthopedic Surgery

## 2024-06-14 ENCOUNTER — Ambulatory Visit: Admitting: Sleep Medicine

## 2024-06-14 VITALS — BP 122/82 | HR 65 | Temp 97.8°F | Ht 63.0 in | Wt 245.8 lb

## 2024-06-14 DIAGNOSIS — D1809 Hemangioma of other sites: Secondary | ICD-10-CM | POA: Diagnosis not present

## 2024-06-14 DIAGNOSIS — M4802 Spinal stenosis, cervical region: Secondary | ICD-10-CM | POA: Diagnosis not present

## 2024-06-14 DIAGNOSIS — M47812 Spondylosis without myelopathy or radiculopathy, cervical region: Secondary | ICD-10-CM

## 2024-06-14 DIAGNOSIS — I10 Essential (primary) hypertension: Secondary | ICD-10-CM

## 2024-06-14 DIAGNOSIS — G4733 Obstructive sleep apnea (adult) (pediatric): Secondary | ICD-10-CM | POA: Diagnosis not present

## 2024-06-14 DIAGNOSIS — M5031 Other cervical disc degeneration,  high cervical region: Secondary | ICD-10-CM | POA: Diagnosis not present

## 2024-06-14 DIAGNOSIS — Z981 Arthrodesis status: Secondary | ICD-10-CM

## 2024-06-14 DIAGNOSIS — G4700 Insomnia, unspecified: Secondary | ICD-10-CM | POA: Diagnosis not present

## 2024-06-14 DIAGNOSIS — Z6841 Body Mass Index (BMI) 40.0 and over, adult: Secondary | ICD-10-CM | POA: Diagnosis not present

## 2024-06-14 DIAGNOSIS — R200 Anesthesia of skin: Secondary | ICD-10-CM

## 2024-06-14 DIAGNOSIS — M5033 Other cervical disc degeneration, cervicothoracic region: Secondary | ICD-10-CM

## 2024-06-14 DIAGNOSIS — M5412 Radiculopathy, cervical region: Secondary | ICD-10-CM

## 2024-06-14 MED ORDER — ZEPBOUND 2.5 MG/0.5ML ~~LOC~~ SOAJ: 2.5000 mg | SUBCUTANEOUS | 0 refills | Status: DC

## 2024-06-14 NOTE — Patient Instructions (Signed)

## 2024-06-14 NOTE — Progress Notes (Signed)
 Name:Anita Gibbs MRN: [id number] DOB: 1971/01/26   CHIEF COMPLAINT:  EXCESSIVE DAYTIME SLEEPINESS   HISTORY OF PRESENT ILLNESS:  Anita Gibbs is a 53 y.o. w/ a h/o OSA, migraine headaches, chronic back pain, sciatica, morbid obesity, HTN, anxiety and depression who presents for c/o loud snoring and excessive daytime sleepiness which has been present for several years. Reports nocturnal awakenings due to chronic pain and has difficulty falling back to sleep. Reports a 15-20 lb weight gain over the last 4 months. Denies any significant weight changes. Admits to morning headaches and night sweats. Denies RLS symptoms, dream enactment, cataplexy, hypnagogic or hypnapompic hallucinations. Denies a family history of sleep apnea. Denies drowsy driving. Drinks tea occasionally, occasional alcohol use, denies tobacco or illicit drug use.   Bedtime 6 am Sleep onset several hours Rise time 11 am    EPWORTH SLEEP SCORE    02/10/2024    1:00 PM 03/06/2020    9:00 AM  Results of the Epworth flowsheet  Sitting and reading 3 3  Watching TV 3 3  Sitting, inactive in a public place (e.g. 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), Adrenal nodule (06/20/2020), Allergy , ANA positive (02/01/2021), Anxiety and depression (01/29/2021), Arthritis, Back pain with history of spinal surgery (01/29/2021), Bilateral carpal tunnel syndrome  (01/29/2021), Bilateral leg edema (01/29/2021), BMI 40.0-44.9, adult (HCC) (04/30/2022), Carpal boss, right, Carpal tunnel syndrome, bilateral, 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) (09/27/2018), Cervical facet hypertrophy (C3-T1) (09/27/2018), Cervical facet joint syndrome (Bilateral) (L>R) (09/27/2018), 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-5, C5-6, and C6-7) (09/27/2018), Cervical myelopathy (HCC) (07/01/2017), Cervicalgia (Primary Area of Pain) (Bilateral) (L>R) (09/27/2018), Chronic gout of foot (Left) (05/31/2018), Chronic low back pain (Tertiary Area of Pain) (Bilateral) (R>L) w/ sciatica (Bilateral) (09/07/2018), Chronic low back pain (Tertiary Area of Pain) (Bilateral) (R>L) w/o sciatica (07/26/2018), Chronic lower extremity pain (Fourth Area of Pain) (Bilateral) (R>L) (09/07/2018), Chronic musculoskeletal pain (10/27/2018), Chronic neck pain (Bilateral) w/ history of cervical spinal surgery (09/27/2018), Chronic neck pain (Primary Area of Pain) (Bilateral) (L>R) (09/07/2018), Chronic sacroiliac joint dysfunction (Bilateral) (09/27/2018), Chronic sacroiliac joint pain (Right) (09/07/2018), Chronic upper extremity pain (Secondary Area of Pain) (Bilateral) (R>L) (09/27/2018), Disorder of skeletal system (09/07/2018), E. coli UTI (urinary tract infection) (06/20/2020), Elevated sed rate (09/08/2018), Elevated serum creatinine (06/20/2020), Excessive daytime sleepiness (03/06/2020), Female pelvic pain (09/05/2020), GERD (gastroesophageal reflux disease), Headache, History of allergy  to shellfish (09/28/2018), History of fusion of cervical spine (ACDF C4-C7) (09/27/2018), History of illicit drug use (03/07/2020), Hives (09/05/2020), Hives (09/05/2020), Hyperkalemia (05/31/2018), Hyperlipidemia (04/30/2022), Hypertension, Large breasts (05/30/2020), Left hip pain (07/21/2022), Lumbar facet arthropathy (04/20/2019), Lumbar radiculitis (L5 dermatome) (Right) (04/17/2020), Multiple environmental  allergies (01/17/2020), Multiple food allergies (03/07/2020), Neurogenic pain (10/27/2018), Numbness and tingling of both feet (05/31/2018), 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), Other intervertebral disc degeneration, 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), Pharmacologic therapy (09/07/2018), Plantar  fasciitis, bilateral (03/04/2021), Polyneuropathy (01/29/2021), Prediabetes (10/10/2019), Problems influencing health status (09/07/2018), Rash (08/08/2019), Seasonal allergic rhinitis due to pollen (11/29/2018), Seasonal allergies (03/06/2020), Seasonal asthma (11/29/2018), Sjogren's disease, Sleep apnea, Somatic dysfunction of sacroiliac joint (Bilateral) (09/27/2018), Spondylosis without myelopathy or radiculopathy, cervical region (03/22/2019), Spondylosis without myelopathy or radiculopathy, lumbosacral region (05/19/2019), Spondylosis, cervical, 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  has a past surgical history that includes Knee arthroscopy (Right); Anterior cervical decomp/discectomy fusion (N/A, 07/01/2017); Abdominal hysterectomy (10/17/2020); and Carpal tunnel release (Bilateral). Prior to Admission medications   Medication Sig Start Date End Date Taking? Authorizing Provider  albuterol  (VENTOLIN  HFA) 108 (90 Base) MCG/ACT inhaler Inhale 2 puffs into the lungs every 6 (six) hours as needed for wheezing or shortness of breath. 10/23/23  Yes Gretel App, NP  amLODipine  (NORVASC ) 5 MG tablet Take 1 tablet (5 mg total) by mouth daily. 11/09/23  Yes Gretel App, NP  atorvastatin  (LIPITOR) 10 MG tablet MWF at night 04/08/23  Yes Hope Merle, MD  buprenorphine  (BUTRANS ) 15 MCG/HR Place 1 patch onto the skin once a week. 01/12/24  Yes Patel, Seema K, NP  busPIRone  (BUSPAR ) 10 MG tablet Take 2 tablets (20 mg total) by mouth 2 (two) times daily. 01/18/24  Yes Eappen, Saramma, MD  butalbital -acetaminophen -caffeine  (FIORICET) 50-325-40 MG tablet Take 1 tablet by mouth every 6  (six) hours as needed for headache. 01/05/24  Yes Gretel App, NP  cetirizine  (ZYRTEC  ALLERGY ) 10 MG tablet Take 1 tablet (10 mg total) by mouth at bedtime as needed. 04/08/23  Yes Hope Merle, MD  Cholecalciferol (VITAMIN D -3) 125 MCG (5000 UT) TABS Take by mouth daily.   Yes [provider]  Cyanocobalamin  (B-12 PO) Take by mouth.   Yes [provider]  dexamethasone  (DECADRON ) 1 MG tablet Take 1 mg by mouth once. 02/23/24  Yes [provider]  dicyclomine  (BENTYL ) 20 MG tablet TAKE 1 TABLET BY MOUTH FOUR TIMES DAILY (BEFORE MEALS AND AT BEDTIME) 01/28/24  Yes Gretel App, NP  DULoxetine  (CYMBALTA ) 60 MG capsule Take 1 capsule (60 mg total) by mouth 2 (two) times daily. 10/23/23  Yes Gretel App, NP  EPINEPHrine  (AUVI-Q ) 0.3 mg/0.3 mL IJ SOAJ injection Use as directed for severe allergic reaction 08/19/18  Yes Kozlow, Camellia PARAS, MD  fluticasone  (FLONASE ) 50 MCG/ACT nasal spray Place 2 sprays into both nostrils daily. 04/08/23  Yes Hope Merle, MD  hydrocortisone  2.5 % cream Apply topically 2 (two) times daily. Prn to eyelids 04/08/23  Yes Hope Merle, MD  hydrOXYzine  (VISTARIL ) 25 MG capsule Take 1 capsule (25 mg total) by mouth 3 (three) times daily. 11/09/23  Yes Gretel App, NP  ibuprofen (ADVIL) 800 MG tablet Take 800 mg by mouth 3 (three) times daily. 10/28/23  Yes [provider]  losartan  (COZAAR ) 100 MG tablet Take 1 tablet (100 mg total) by mouth daily. 04/08/23  Yes Hope Merle, MD  Melatonin 10 MG CAPS Take 10 mg by mouth at bedtime. 02/10/24  Yes Kasa, Kurian, MD  methocarbamol  (ROBAXIN ) 750 MG tablet Take 1 tablet (750 mg total) by mouth every 8 (eight) hours as needed for muscle spasms. 01/12/24 01/06/25 Yes Patel, Seema K, NP  montelukast  (SINGULAIR ) 10 MG tablet Take 1 tablet (10 mg total) by mouth at bedtime. 04/08/23  Yes Hope Merle, MD  pantoprazole  (PROTONIX ) 40 MG tablet TAKE ONE TABLET EVERY DAY 30 MIN BEFORE DINNER 04/08/23  Yes Walsh, Tanya, MD   polyethylene glycol powder (GLYCOLAX /MIRALAX ) 17 GM/SCOOP powder Take 17 g by  mouth 2 (two) times daily as needed. 08/05/22  Yes Hope Merle, MD  potassium chloride  SA (KLOR-CON  M) 20 MEQ tablet Take 1 tablet (20 mEq total) by mouth daily as needed. With lasix  04/08/23  Yes Hope Merle, MD  pregabalin  (LYRICA ) 100 MG capsule Take 1 capsule (100 mg total) by mouth 3 (three) times daily. 02/25/24  Yes Marcelino Nurse, MD  saccharomyces boulardii (FLORASTOR) 250 MG capsule Take 1 capsule (250 mg total) by mouth daily. 04/08/23  Yes Hope Merle, MD  traZODone  (DESYREL ) 50 MG tablet Take 1-2 tablets (50-100 mg total) by mouth at bedtime as needed for sleep. 02/25/24  Yes Eappen, Saramma, MD  triamcinolone  cream (KENALOG ) 0.1 % Apply 1 Application topically 2 (two) times daily. 11/24/23  Yes Gretel App, NP  fluconazole  (DIFLUCAN ) 150 MG tablet Take 1 tablet by mouth x 1 dose. May repeat in 72 hours if needed. Patient not taking: Reported on 03/01/2024 08/31/23   Gretel App, NP  meloxicam  (MOBIC ) 15 MG tablet Take 1 tablet (15 mg total) by mouth every other day as needed. Patient not taking: Reported on 03/01/2024 12/24/21   Marcelino Nurse, MD   Allergies  Allergen Reactions   Other Hives    White and red sauces   Pineapple     Swelling  Other Reaction(s): Not available  pineapple allergenic extract   Pineapple Extract Swelling    pineapple extract   Shellfish Allergy  Hives   Strawberry (Diagnostic) Hives   Tomato Hives    cherry  Solanum (organism)    FAMILY HISTORY:  family history includes [name] in her mother; Arthritis in her father, mother, and sister; Breast cancer in her maternal aunt and paternal aunt; COPD in her brother and son; Cancer in her brother; Depression in her son; Diabetes in her mother; Early death in her brother; Hearing loss in her father; Hyperlipidemia in her mother; Hypertension in her father, mother, and sister; Kidney disease in her father; Lymphoma in her father;  Post-traumatic stress disorder in her brother and son; Sleep apnea in her daughter. SOCIAL HISTORY:  reports that she has never smoked. She has never used smokeless tobacco. She reports current alcohol use of about 2.0 standard drinks of alcohol per week. She reports that she does not use drugs.   Review of Systems:  Gen:  Denies  fever, sweats, chills weight loss  HEENT: Denies blurred vision, double vision, ear pain, eye pain, hearing loss, nose bleeds, sore throat Cardiac:  No dizziness, chest pain or heaviness, chest tightness,edema, No JVD Resp:   No cough, -sputum production, -shortness of breath,-wheezing, -hemoptysis,  Gi: Denies swallowing difficulty, stomach pain, nausea or vomiting, diarrhea, constipation, bowel incontinence Gu:  Denies bladder incontinence, burning urine Ext:   Denies Joint pain, stiffness or swelling Skin: Denies  skin rash, easy bruising or bleeding or hives Endoc:  Denies polyuria, polydipsia , polyphagia or weight change Psych:   Denies depression, insomnia or hallucinations  Other:  All other systems negative  VITAL SIGNS: BP 122/82   Pulse 65   Temp 97.8 F (36.6 C)   Ht 5' 3 (1.6 m)   Wt 245 lb 12.8 oz (111.5 kg)   LMP  (LMP Unknown)   SpO2 99%   BMI 43.54 kg/m    Physical Examination:   General Appearance: No distress  EYES PERRLA, EOM intact.   NECK Supple, No JVD Pulmonary: normal breath sounds, No wheezing.  CardiovascularNormal S1,S2.  No m/r/g.   Abdomen: Benign, Soft, non-tender. Skin:  warm, no rashes, no ecchymosis  Extremities: normal, no cyanosis, clubbing. Neuro:without focal findings,  speech normal  PSYCHIATRIC: Mood, affect within normal limits.   ASSESSMENT AND PLAN  OSA I suspect that OSA is likely present due to clinical presentation. Discussed the consequences of untreated sleep apnea. Advised not to drive drowsy for safety of patient and others. Will complete further evaluation with a home sleep study and  follow up to review results.    HTN Stable, on current management. Following with PCP.   Insomnia Counseled patient on stimulus control and improving sleep hygiene practices.   Morbid obesity Counseled patient on diet and lifestyle modification. Will restart patient on Zepbound  2.5 mg weekly.    Patient  satisfied with Plan of action and management. All questions answered  I spent a total of 40 minutes reviewing chart data, face-to-face evaluation with the patient, counseling and coordination of care as detailed above.    Lounell Schumacher, M.D.  Sleep Medicine [facility]

## 2024-06-16 ENCOUNTER — Ambulatory Visit: Payer: Self-pay | Admitting: Nurse Practitioner

## 2024-06-16 ENCOUNTER — Encounter: Payer: Self-pay | Admitting: Nurse Practitioner

## 2024-06-16 ENCOUNTER — Ambulatory Visit: Admitting: Nurse Practitioner

## 2024-06-16 VITALS — BP 128/80 | HR 60 | Temp 98.4°F | Ht 63.0 in | Wt 242.0 lb

## 2024-06-16 DIAGNOSIS — G4733 Obstructive sleep apnea (adult) (pediatric): Secondary | ICD-10-CM | POA: Diagnosis not present

## 2024-06-16 DIAGNOSIS — R1030 Lower abdominal pain, unspecified: Secondary | ICD-10-CM | POA: Diagnosis not present

## 2024-06-16 DIAGNOSIS — M47819 Spondylosis without myelopathy or radiculopathy, site unspecified: Secondary | ICD-10-CM | POA: Diagnosis not present

## 2024-06-16 DIAGNOSIS — Z981 Arthrodesis status: Secondary | ICD-10-CM

## 2024-06-16 DIAGNOSIS — Z6841 Body Mass Index (BMI) 40.0 and over, adult: Secondary | ICD-10-CM | POA: Diagnosis not present

## 2024-06-16 DIAGNOSIS — M47812 Spondylosis without myelopathy or radiculopathy, cervical region: Secondary | ICD-10-CM

## 2024-06-16 DIAGNOSIS — G894 Chronic pain syndrome: Secondary | ICD-10-CM | POA: Diagnosis not present

## 2024-06-16 DIAGNOSIS — F39 Unspecified mood [affective] disorder: Secondary | ICD-10-CM | POA: Diagnosis not present

## 2024-06-16 DIAGNOSIS — I10 Essential (primary) hypertension: Secondary | ICD-10-CM

## 2024-06-16 DIAGNOSIS — R200 Anesthesia of skin: Secondary | ICD-10-CM

## 2024-06-16 DIAGNOSIS — R7303 Prediabetes: Secondary | ICD-10-CM

## 2024-06-16 DIAGNOSIS — J069 Acute upper respiratory infection, unspecified: Secondary | ICD-10-CM | POA: Diagnosis not present

## 2024-06-16 DIAGNOSIS — N949 Unspecified condition associated with female genital organs and menstrual cycle: Secondary | ICD-10-CM

## 2024-06-16 DIAGNOSIS — M5412 Radiculopathy, cervical region: Secondary | ICD-10-CM

## 2024-06-16 LAB — COMPREHENSIVE METABOLIC PANEL WITH GFR
ALT: 14 U/L (ref 0–35)
AST: 13 U/L (ref 0–37)
Albumin: 4.3 g/dL (ref 3.5–5.2)
Alkaline Phosphatase: 59 U/L (ref 39–117)
BUN: 14 mg/dL (ref 6–23)
CO2: 26 meq/L (ref 19–32)
Calcium: 9.2 mg/dL (ref 8.4–10.5)
Chloride: 105 meq/L (ref 96–112)
Creatinine, Ser: 0.95 mg/dL (ref 0.40–1.20)
GFR: 69.65 mL/min (ref >60.00)
Glucose, Bld: 107 mg/dL — ABNORMAL HIGH (ref 70–99)
Potassium: 3.9 meq/L (ref 3.5–5.1)
Sodium: 141 meq/L (ref 135–145)
Total Bilirubin: 0.5 mg/dL (ref 0.2–1.2)
Total Protein: 7.4 g/dL (ref 6.0–8.3)

## 2024-06-16 LAB — CBC WITH DIFFERENTIAL/PLATELET
Basophils Absolute: 0 K/uL (ref 0.0–0.1)
Basophils Relative: 0.3 % (ref 0.0–3.0)
Eosinophils Absolute: 0.1 K/uL (ref 0.0–0.7)
Eosinophils Relative: 1.3 % (ref 0.0–5.0)
HCT: 37.5 % (ref 36.0–46.0)
Hemoglobin: 12.3 g/dL (ref 12.0–15.0)
Lymphocytes Relative: 46 % (ref 12.0–46.0)
Lymphs Abs: 3.1 K/uL (ref 0.7–4.0)
MCHC: 32.9 g/dL (ref 30.0–36.0)
MCV: 98.7 fl (ref 78.0–100.0)
Monocytes Absolute: 0.6 K/uL (ref 0.1–1.0)
Monocytes Relative: 8.7 % (ref 3.0–12.0)
Neutro Abs: 2.9 K/uL (ref 1.4–7.7)
Neutrophils Relative %: 43.7 % (ref 43.0–77.0)
Platelets: 233 K/uL (ref 150.0–400.0)
RBC: 3.8 Mil/uL — ABNORMAL LOW (ref 3.87–5.11)
RDW: 13.5 % (ref 11.5–15.5)
WBC: 6.7 K/uL (ref 4.0–10.5)

## 2024-06-16 MED ORDER — PREDNISONE 20 MG PO TABS: 40.0000 mg | ORAL_TABLET | Freq: Every day | ORAL | 0 refills | Status: DC

## 2024-06-16 MED ORDER — AZITHROMYCIN 250 MG PO TABS: ORAL_TABLET | ORAL | 0 refills | Status: AC

## 2024-06-16 NOTE — Progress Notes (Signed)
 Anita Glance, NP-C Phone: [phone]  Anita Gibbs is a 53 y.o. female who presents today for follow up.   Discussed the use of AI scribe software for clinical note transcription with the patient, who gave verbal consent to proceed.  History of Present Illness   Anita Gibbs is a 53 year old female with sleep apnea and prediabetes who presents with persistent cough, abdominal pain, and dizziness.  She has been experiencing a persistent cough and sore throat for about two weeks, accompanied by nausea and vomiting. Despite using Flonase  and Zyrtec , she continues to have a dry cough, ear fullness, and congestion. She stopped taking Zepbound , suspecting it might be the cause, and plans to restart at a lower dose of 2.5 mg on Sunday. No specific cause was identified during her hospital visit, and it was attributed to viral infections.  She experiences generalized abdominal bloating and sharp pain, primarily in the right lower quadrant, which persists despite not eating much. This has been on going for several months. She has not undergone any recent imaging for this issue. She has a history of partial hysterectomy but retains her ovaries.  She reports dizziness and lightheadedness, particularly when standing up, which has been chronic over the past two weeks. She also experiences shortness of breath and migraines. She takes amlodipine , losartan , and hydrochlorothiazide  for blood pressure.  She has a history of sleep apnea, which was confirmed despite initial doubts about the diagnosis. Her mood fluctuates, with good and bad days, and she takes Buspar , Cymbalta , hydroxyzine , and trazodone , which she reports helps with sleep.      Social History   Tobacco Use  Smoking Status Never  Smokeless Tobacco Never    Current Outpatient Medications on File Prior to Visit  Medication Sig Dispense Refill   albuterol  (VENTOLIN  HFA) 108 (90 Base) MCG/ACT inhaler Inhale 2 puffs into the lungs every 6  (six) hours as needed for wheezing or shortness of breath. 8 g 11   amLODipine  (NORVASC ) 5 MG tablet Take 1 tablet (5 mg total) by mouth daily. 90 tablet 0   atorvastatin  (LIPITOR) 10 MG tablet Take 1 tablet by mouth on Monday, Wednesday and Friday. 90 tablet 3   busPIRone  (BUSPAR ) 10 MG tablet Take 2 tablets (20 mg total) by mouth 2 (two) times daily.     butalbital -acetaminophen -caffeine  (FIORICET) 50-325-40 MG tablet Take 1 tablet by mouth every 6 (six) hours as needed for headache. 30 tablet 0   cetirizine  (ZYRTEC  ALLERGY ) 10 MG tablet Take 1 tablet (10 mg total) by mouth at bedtime as needed. 90 tablet 3   Cholecalciferol (VITAMIN D -3) 125 MCG (5000 UT) TABS Take by mouth daily.     Cyanocobalamin  (B-12 PO) Take by mouth.     dicyclomine  (BENTYL ) 20 MG tablet TAKE 1 TABLET BY MOUTH FOUR TIMES DAILY (BEFORE MEALS AND AT BEDTIME) 120 tablet 5   DULoxetine  (CYMBALTA ) 60 MG capsule Take 1 capsule (60 mg total) by mouth 2 (two) times daily. 60 capsule 2   ENBREL SURECLICK 50 MG/ML injection Inject into the skin.     EPINEPHrine  (AUVI-Q ) 0.3 mg/0.3 mL IJ SOAJ injection Use as directed for severe allergic reaction 2 Device 2   famotidine  (PEPCID ) 20 MG tablet Take 1 tablet (20 mg total) by mouth 2 (two) times daily. 60 tablet 0   fluticasone  (FLONASE ) 50 MCG/ACT nasal spray Place 2 sprays into both nostrils daily. 16 g 6   hydrochlorothiazide  (HYDRODIURIL ) 25 MG tablet  hydrocortisone  2.5 % cream Apply topically 2 (two) times daily. Prn to eyelids 30 g 0   hydrOXYzine  (VISTARIL ) 25 MG capsule Take 1 capsule (25 mg total) by mouth 3 (three) times daily. 90 capsule 2   losartan  (COZAAR ) 100 MG tablet Take 1 tablet (100 mg total) by mouth daily. 90 tablet 3   Melatonin 10 MG CAPS Take 10 mg by mouth at bedtime. 30 capsule 6   methocarbamol  (ROBAXIN ) 750 MG tablet Take 1 tablet (750 mg total) by mouth every 8 (eight) hours as needed for muscle spasms. 90 tablet 11   montelukast  (SINGULAIR ) 10 MG  tablet Take 1 tablet (10 mg total) by mouth at bedtime. 90 tablet 3   ondansetron  (ZOFRAN -ODT) 4 MG disintegrating tablet Take 1 tablet (4 mg total) by mouth every 8 (eight) hours as needed for nausea or vomiting. 20 tablet 0   pantoprazole  (PROTONIX ) 40 MG tablet TAKE ONE TABLET EVERY DAY 30 MIN BEFORE DINNER 90 tablet 3   polyethylene glycol powder (GLYCOLAX /MIRALAX ) 17 GM/SCOOP powder Take 17 g by mouth 2 (two) times daily as needed. 3350 g 1   potassium chloride  SA (KLOR-CON  M) 20 MEQ tablet Take 1 tablet (20 mEq total) by mouth daily as needed. With lasix  30 tablet 0   pregabalin  (LYRICA ) 100 MG capsule Take 1 capsule (100 mg total) by mouth 3 (three) times daily. 90 capsule 2   saccharomyces boulardii (FLORASTOR) 250 MG capsule Take 1 capsule (250 mg total) by mouth daily. 90 capsule 0   tirzepatide  (ZEPBOUND ) 2.5 MG/0.5ML Pen Inject 2.5 mg into the skin once a week. 6 mL 0   traMADol  (ULTRAM -ER) 100 MG 24 hr tablet Take 1 tablet (100 mg total) by mouth daily. 30 tablet 0   traZODone  (DESYREL ) 50 MG tablet Take 1-2 tablets (50-100 mg total) by mouth at bedtime as needed for sleep. 60 tablet 1   triamcinolone  cream (KENALOG ) 0.1 % Apply 1 Application topically 2 (two) times daily. 30 g 0   No current facility-administered medications on file prior to visit.     ROS see history of present illness  Objective  Physical Exam Vitals:   06/16/24 0808  BP: 128/80  Pulse: 60  Temp: 98.4 F (36.9 C)  SpO2: 98%    BP Readings from Last 3 Encounters:  06/16/24 128/80  06/14/24 122/82  06/08/24 (!) 104/90   Wt Readings from Last 3 Encounters:  06/16/24 242 lb (109.8 kg)  06/14/24 245 lb 12.8 oz (111.5 kg)  06/08/24 245 lb (111.1 kg)    Physical Exam Constitutional:      General: She is not in acute distress.    Appearance: Normal appearance.  HENT:     Head: Normocephalic.     Right Ear: Tympanic membrane is bulging.     Left Ear: Tympanic membrane is bulging.     Nose: Nose  normal.     Mouth/Throat:     Mouth: Mucous membranes are moist.     Pharynx: Oropharynx is clear.  Eyes:     Conjunctiva/sclera: Conjunctivae normal.     Pupils: Pupils are equal, round, and reactive to light.  Cardiovascular:     Rate and Rhythm: Normal rate and regular rhythm.     Heart sounds: Normal heart sounds.  Pulmonary:     Effort: Pulmonary effort is normal.     Breath sounds: Normal breath sounds.  Abdominal:     General: Abdomen is flat. Bowel sounds are normal.  Palpations: Abdomen is soft.     Tenderness: There is abdominal tenderness in the right lower quadrant.  Lymphadenopathy:     Cervical: No cervical adenopathy.  Skin:    General: Skin is warm and dry.  Neurological:     General: No focal deficit present.     Mental Status: She is alert.  Psychiatric:        Mood and Affect: Mood normal.        Behavior: Behavior normal.      Assessment/Plan: Please see individual problem list.  Lower abdominal pain Assessment & Plan: She experiences persistent sharp pain in the right lower quadrant with bloating for several months. Order a CT scan of the abdomen and pelvis. Check lab work as outlined.   Orders: -     CBC with Differential/Platelet -     Comprehensive metabolic panel with GFR -     CT ABDOMEN PELVIS W CONTRAST; Future  URI with cough and congestion Assessment & Plan: She has a persistent dry cough, possibly related to chronic rhinitis and URI. No specific treatment was initiated previously due to polypharmacy concerns. Start Azithromycin  and Prednisone . Chronic rhinitis with eustachian tube dysfunction causes ear fullness and congestion. She uses Flonase  daily. Continue. Return precautions given to patient. Encourage adequate hydration.   Orders: -     Azithromycin ; Take 2 tablets on day 1, then 1 tablet daily on days 2 through 5  Dispense: 6 tablet; Refill: 0 -     predniSONE ; Take 2 tablets (40 mg total) by mouth daily.  Dispense: 10 tablet;  Refill: 0  Mood disorder Assessment & Plan: Depression with variable mood is managed with Buspar , Cymbalta , hydroxyzine , and trazodone . She has shown recent improvement with better sleep. Continue current psychiatric medications. We will continue to monitor.    OSA (obstructive sleep apnea) Assessment & Plan: She has a confirmed diagnosis of obstructive sleep apnea with previous issues with CPAP adherence. Continue Zepbound  2.5 mg weekly. Follow up with Pulm as scheduled.    Morbid obesity with BMI of 40.0-44.9, adult [facility]) Assessment & Plan: Obesity is managed with Zepbound , assisting with weight loss and comorbidities. Restart Zepbound  at 2.5 mg weekly on Sunday. Experienced adverse effects with increased dose. Encourage healthy diet and regular exercise. We will continue to monitor.    Essential hypertension Assessment & Plan: Her blood pressure is well-controlled on amlodipine , losartan , and hydrochlorothiazide . Continue current antihypertensive medications.   Chronic pain syndrome Assessment & Plan: Ongoing management with rheumatology and neurosurgery continues. Monthly infusions are in progress. Pain control is not yet achieved. Follow up with specialists as scheduled.    Cervical spondylosis Assessment & Plan: Her cervical pain persists despite a previous ineffective nerve block. Pain management is considering cervical injections, and neurosurgery is reviewing scans. Follow up with pain management and neurosurgery as scheduled.    Seronegative spondyloarthropathy Assessment & Plan: She is undergoing infusions with no significant pain improvement. Follow up with physical med and rheumatology as scheduled.      Return in about 3 months (around 09/16/2024), or if symptoms worsen or fail to improve, for Follow up.   Anita Glance, NP-C [facility]

## 2024-06-17 ENCOUNTER — Encounter: Payer: Self-pay | Admitting: Nurse Practitioner

## 2024-06-17 ENCOUNTER — Telehealth: Payer: Self-pay

## 2024-06-17 NOTE — Telephone Encounter (Signed)
*  Pulm  Pharmacy Patient Advocate Encounter   Received notification from Fax that prior authorization for Zepbound  2.5mg  pen is required/requested.   Insurance verification completed.   The patient is insured through [facility] MEDICAID.   Per test claim: Effective October 1st, Medicaid will discontinue coverage of GLP1 medications for weight loss (such as Wegovy  and Zepbound ), unless the patient has a documented history of a heart attack or stroke. Zepbound  will continue to be covered only for patients with moderate to severe sleep apnea (AHI 15-30) and a BMI greater than 40. Because of this change, the prior authorization team will not be submitting new PA requests for GLP1 medications prescribed for weight loss, as patients will be unable to continue therapy under Medicaid coverage.

## 2024-06-17 NOTE — Assessment & Plan Note (Signed)
 She experiences persistent sharp pain in the right lower quadrant with bloating for several months. Order a CT scan of the abdomen and pelvis. Check lab work as outlined.

## 2024-06-17 NOTE — Assessment & Plan Note (Signed)
 She has a confirmed diagnosis of obstructive sleep apnea with previous issues with CPAP adherence. Continue Zepbound  2.5 mg weekly. Follow up with Pulm as scheduled.

## 2024-06-17 NOTE — Addendum Note (Signed)
 Addended by: HILMA HASTINGS on: 06/17/2024 04:15 PM   Modules accepted: Orders

## 2024-06-17 NOTE — Assessment & Plan Note (Addendum)
 Her cervical pain persists despite a previous ineffective nerve block. Pain management is considering cervical injections, and neurosurgery is reviewing scans. Follow up with pain management and neurosurgery as scheduled.

## 2024-06-17 NOTE — Assessment & Plan Note (Signed)
 Depression with variable mood is managed with Buspar , Cymbalta , hydroxyzine , and trazodone . She has shown recent improvement with better sleep. Continue current psychiatric medications. We will continue to monitor.

## 2024-06-17 NOTE — Assessment & Plan Note (Signed)
 Her blood pressure is well-controlled on amlodipine , losartan , and hydrochlorothiazide . Continue current antihypertensive medications.

## 2024-06-17 NOTE — Assessment & Plan Note (Signed)
 She is undergoing infusions with no significant pain improvement. Follow up with physical med and rheumatology as scheduled.

## 2024-06-17 NOTE — Assessment & Plan Note (Signed)
 She has a persistent dry cough, possibly related to chronic rhinitis and URI. No specific treatment was initiated previously due to polypharmacy concerns. Start Azithromycin  and Prednisone . Chronic rhinitis with eustachian tube dysfunction causes ear fullness and congestion. She uses Flonase  daily. Continue. Return precautions given to patient. Encourage adequate hydration.

## 2024-06-17 NOTE — Assessment & Plan Note (Signed)
 Ongoing management with rheumatology and neurosurgery continues. Monthly infusions are in progress. Pain control is not yet achieved. Follow up with specialists as scheduled.

## 2024-06-17 NOTE — Assessment & Plan Note (Signed)
 Obesity is managed with Zepbound , assisting with weight loss and comorbidities. Restart Zepbound  at 2.5 mg weekly on Sunday. Experienced adverse effects with increased dose. Encourage healthy diet and regular exercise. We will continue to monitor.

## 2024-06-20 ENCOUNTER — Ambulatory Visit: Admitting: Sleep Medicine

## 2024-06-21 ENCOUNTER — Ambulatory Visit
Admission: RE | Admit: 2024-06-21 | Discharge: 2024-06-21 | Disposition: A | Discharge status: Home / Self Care | Source: Ambulatory Visit | Attending: Nurse Practitioner | Admitting: Nurse Practitioner

## 2024-06-21 DIAGNOSIS — R1030 Lower abdominal pain, unspecified: Secondary | ICD-10-CM | POA: Diagnosis not present

## 2024-06-21 MED ORDER — IOHEXOL 300 MG/ML  SOLN
100.0000 mL | Freq: Once | INTRAMUSCULAR | Status: AC | PRN
  Administered 2024-06-21: 100 mL via INTRAVENOUS

## 2024-06-21 NOTE — Therapy (Signed)
 OUTPATIENT PHYSICAL THERAPY CERVICAL EVALUATION   Patient Name: Anita Gibbs MRN: [id number] DOB:Jul 21, 1971, 53 y.o., female Today's Date: 06/22/2024  END OF SESSION:  PT End of Session - 06/22/24 0853     Visit Number 1    Number of Visits 17    Date for Recertification  08/17/24    PT Start Time 0856    PT Stop Time 0938    PT Time Calculation (min) 42 min    Activity Tolerance Patient tolerated treatment well    Behavior During Therapy [facility] for tasks assessed/performed          Past Medical History:  Diagnosis Date   Abdominal pain 06/11/2020   Abnormal drug screen 03/06/2020   Abnormal MRI, cervical spine (06/10/2018) 09/27/2018   FINDINGS:  Vertebrae: fusion hardware at C4, C5, C6, and C7.  Posterior Fossa, vertebral arteries, paraspinal tissues: A relatively empty sella present.     Disc levels:  C3-4: Negative. A mild broad-based disc osteophyte complex present. Uncovertebral spurring contributes to mild foraminal narrowing bilaterally.  C4-5: A leftward disc osteophyte complex is present. Uncovertebral and facet disease   Acute cystitis without hematuria 06/18/2020   Acute vaginitis 06/11/2020   Adenoma of left adrenal gland 08/01/2020   Adrenal nodule 06/20/2020   Allergy     ANA positive 02/01/2021   Anxiety and depression 01/29/2021   Arthritis    Back pain with history of spinal surgery 01/29/2021   Bilateral carpal tunnel syndrome 01/29/2021   Bilateral leg edema 01/29/2021   BMI 40.0-44.9, adult (HCC) 04/30/2022   Carpal boss, right    Carpal tunnel syndrome, bilateral    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) 09/27/2018   Levels:  C4-5: There is partial effacement of ventral CSF.     IMPRESSION:  Mild residual central canal narrowing at C4-5 s/p ACDF.   Cervical facet hypertrophy (C3-T1) 09/27/2018   Levels:  C3-4: Uncovertebral spurring  C4-5: Uncovertebral and facet disease  C5-6: Residual uncovertebral disease.  C6-7: Residual uncovertebral disease.   C7-T1: Minimal left-sided uncovertebral spurring   Cervical facet joint syndrome (Bilateral) (L>R) 09/27/2018   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-5, C5-6, and C6-7) 09/27/2018   Levels:  C3-4: Mild foraminal narrowing bilaterally.  C4-5: Mild left foraminal narrowing.  C5-6: Mild left foraminal narrowing is due to residual uncovertebral disease.  C6-7: Mild left foraminal narrowing is due to residual uncovertebral disease.   Cervical myelopathy (HCC) 07/01/2017   Cervicalgia (Primary Area of Pain) (Bilateral) (L>R) 09/27/2018   Chronic gout of foot (Left) 05/31/2018   Chronic low back pain [facility] Area of Pain) (Bilateral) (R>L) w/ sciatica (Bilateral) 09/07/2018   Chronic low back pain [facility] Area of Pain) (Bilateral) (R>L) w/o sciatica 07/26/2018   Chronic lower extremity pain (Fourth Area of Pain) (Bilateral) (R>L) 09/07/2018   Chronic musculoskeletal pain 10/27/2018   Chronic neck pain (Bilateral) w/ history of cervical spinal surgery 09/27/2018   Chronic neck pain (Primary Area of Pain) (Bilateral) (L>R) 09/07/2018   Chronic sacroiliac joint dysfunction (Bilateral) 09/27/2018   Chronic sacroiliac joint pain (Right) 09/07/2018   Chronic upper extremity pain (Secondary Area of Pain) (Bilateral) (R>L) 09/27/2018   Disorder of skeletal system 09/07/2018   E. coli UTI (urinary tract infection) 06/20/2020   Elevated sed rate 09/08/2018   Elevated serum creatinine 06/20/2020   Excessive daytime sleepiness 03/06/2020   Female pelvic pain 09/05/2020   GERD (gastroesophageal reflux disease)    Headache    History of allergy  to shellfish 09/28/2018  History of fusion of cervical spine (ACDF C4-C7) 09/27/2018   History of illicit drug use 03/07/2020   Hives 09/05/2020   Hives 09/05/2020   Hyperkalemia 05/31/2018   Mild   Hyperlipidemia 04/30/2022   Hypertension    Large breasts 05/30/2020   Left hip pain 07/21/2022   Lumbar facet arthropathy 04/20/2019    Lumbar radiculitis (L5 dermatome) (Right) 04/17/2020   Multiple environmental allergies 01/17/2020   Multiple food allergies 03/07/2020   Neurogenic pain 10/27/2018   Numbness and tingling of both feet 05/31/2018   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   Other intervertebral disc degeneration, lumbar region 11/15/2019   Pharmacologic therapy 09/07/2018   Plantar fasciitis, bilateral 03/04/2021   Polyneuropathy 01/29/2021   Prediabetes 10/10/2019   Problems influencing health status 09/07/2018   Rash 08/08/2019   Seasonal allergic rhinitis due to pollen 11/29/2018   Seasonal allergies 03/06/2020   Seasonal asthma 11/29/2018   Sjogren's disease    Sleep apnea    Somatic dysfunction of sacroiliac joint (Bilateral) 09/27/2018   Spondylosis without myelopathy or radiculopathy, cervical region 03/22/2019   Spondylosis without myelopathy or radiculopathy, lumbosacral region 05/19/2019   Spondylosis, cervical, w/ myelopathy 09/27/2018   URI with cough and congestion 06/16/2024   Vaginal discharge 06/11/2020   Past Surgical History:  Procedure Laterality Date   ABDOMINAL HYSTERECTOMY  10/17/2020   [facility]; she has both ovaries   ANTERIOR CERVICAL DECOMP/DISCECTOMY FUSION N/A 07/01/2017   Procedure: ANTERIOR CERVICAL DECOMPRESSION/DISCECTOMY FUSION 3 LEVELS;  Surgeon: Clois Fret, MD;  Location: [facility] ORS;  Service: Neurosurgery;  Laterality: N/A;   CARPAL TUNNEL RELEASE Bilateral    KNEE ARTHROSCOPY Right    Patient Active Problem List   Diagnosis Date Noted   URI with cough and congestion 06/16/2024   Hypokalemia 06/09/2024   Nausea 06/08/2024   Abdominal bloating 05/05/2024   Cervical radicular pain 03/22/2024   Bilateral occipital neuralgia 03/22/2024   Sjogren syndrome with inflammatory arthritis 03/21/2024   MDD (major depressive disorder), recurrent episode, severe (HCC) 03/16/2024   Insomnia 02/25/2024   MDD (major depressive disorder), recurrent  episode, moderate (HCC) 01/18/2024   OSA (obstructive sleep apnea) 01/18/2024   Seronegative spondyloarthropathy 01/05/2024   Episodic tension-type headache, not intractable 01/05/2024   Palpitations 11/24/2023   Wasp sting 11/24/2023   AKI (acute kidney injury) 11/02/2023   Other chest pain 10/29/2023   GAD (generalized anxiety disorder) 09/30/2023   Lower abdominal pain 07/22/2023   Abnormal glucose 04/24/2023   Mood disorder 04/24/2023   Annual physical exam 04/24/2023   Anemia 04/24/2023   Chronic frontal sinusitis 10/08/2022   Abdominal pain, left lower quadrant 06/30/2022   Morbid obesity with BMI of 40.0-44.9, adult (HCC) 04/30/2022   Allergic rhinitis 03/04/2021   Bilateral leg edema 01/29/2021   Lumbosacral radiculopathy at L5 01/29/2021   Neuropathy 01/29/2021   Vaginal discharge 06/11/2020   Bacterial vaginosis 06/11/2020   Pain medication agreement broken 03/07/2020   Prediabetes 10/10/2019   HLD (hyperlipidemia) 10/10/2019   Gastroesophageal reflux disease 09/06/2019   Fibroid uterus 09/06/2019   Seasonal asthma 11/29/2018   DDD (degenerative disc disease), cervical 09/27/2018   Cervical spondylosis 09/27/2018   DDD (degenerative disc disease), lumbar 09/27/2018   Vitamin D  deficiency 09/27/2018   Chronic pain syndrome 09/07/2018   Essential hypertension 05/31/2018    PCP: Gretel App, NP  REFERRING PROVIDER: Hilma Hastings, PA-C  REFERRING DIAG:  Z98.1 (ICD-10-CM) - S/P cervical spinal fusion M47.812 (ICD-10-CM) - Cervical spondylosis M54.12 (ICD-10-CM) - Cervical radiculopathy R20.0,R20.2 (  ICD-10-CM) - Numbness and tingling in left hand  THERAPY DIAG:  Muscle weakness (generalized)  Cervicalgia  Radiculopathy, cervical region  Rationale for Evaluation and Treatment: Rehabilitation  ONSET DATE: Chronic   SUBJECTIVE:                                                                                                                                                                                                          SUBJECTIVE STATEMENT: Patient reports she is having more pain recently and getting migraines. She is having trouble with dropping items in her hands. No issues with grip but maintaining the grip is difficult. The pain is more on the L than R. Patient reports if she turns her head too many times, she may get lightheaded.    Hand dominance: Left  PERTINENT HISTORY:  She is s/p C4-C7 ACDF on 07/01/17 by Dr. Clois. She continues with constant neck pain radiating down left arm to the hand causing numbness and tingling. She has intermittent pain in right harm. She has weakness in both hands.  PMH of HTN, asthma, GERD, chronic pain syndrome, hyperlipidemia, obesity.  PAIN:  Are you having pain? Yes: NPRS scale: 7/10; worst 10/10 Pain location: neck Pain description: aching, dull, throbbing Aggravating factors: housework, any activity  Relieving factors: heat, pain medication  PRECAUTIONS: None  RED FLAGS: Cervical red flags: Diplopia Yes:       WEIGHT BEARING RESTRICTIONS: No  FALLS:  Has patient fallen in last 6 months? No  LIVING ENVIRONMENT: Lives with: lives alone Lives in: House/apartment Has following equipment at home: Single point cane  OCCUPATION: not working, trying to get disability   PLOF: Independent - occasional assistance with ADLs when in severe pain  PATIENT GOALS: to see if I can get some type of relief    OBJECTIVE:  Note: Objective measures were completed at Evaluation unless otherwise noted.  DIAGNOSTIC FINDINGS:  N/A   PATIENT SURVEYS:  NDI:  NECK DISABILITY INDEX  Date: 06/22/2024 Score  Pain intensity 3 = The pain is fairly severe at the moment  2. Personal care (washing, dressing, etc.) 3 =  I need some help but can manage most of my personal care  3. Lifting 4 =  I can only lift very light weights  4. Reading 3 = I can't read as much as I want because of moderate pain in my  neck  5. Headaches 4 = I have severe headaches, which come frequently   6. Concentration 2 = I have a fair degree of difficulty in concentrating when  I want to  7. Work 4 = I can hardly do any work at all  8. Driving 2 =  I can drive my car as long as I want with moderate pain in my neck  9. Sleeping 5 =  My sleep is completely disturbed (5-7 hrs sleepless)   10. Recreation 3 = I am able to engage in a few of my usual recreation activities because of pain in   my neck  Total 33/50 = 66%   Minimum Detectable Change (90% confidence): 5 points or 10% points  COGNITION: Overall cognitive status: Within functional limits for tasks assessed  SENSATION: Intermittent numbness/tingling in B hands (L>R)  POSTURE: rounded shoulders and forward head  PALPATION: Tightness noted to B UT, cervical paraspinals, suboccipitals  Tenderness noted but patient reports this is likely due to autoimmune disorder.   CERVICAL ROM:   Active ROM A/PROM (deg) eval  Flexion 25  Extension 28  Right lateral flexion 25  Left lateral flexion 15  Right rotation 10  Left rotation 20   (Blank rows = not tested)  UPPER EXTREMITY MMT:  MMT Right eval Left eval  Shoulder flexion 4 4  Shoulder extension    Shoulder abduction 4 4  Shoulder adduction    Shoulder extension    Shoulder internal rotation    Shoulder external rotation    Elbow flexion 4 4  Elbow extension 4 4  Wrist flexion    Wrist extension    Wrist ulnar deviation    Wrist radial deviation    Wrist pronation    Wrist supination     (Blank rows = not tested)  UPPER EXTREMITY ROM:  AROM WFL   CERVICAL SPECIAL TESTS:  Distraction test: Positive - decrease in L UE numbness/tingling    TREATMENT DATE: 06/22/24                                                                                                                               Manual Therapy  Cervical distraction 10 x 10 second holds - decrease in LUE numbness/tingling    Upper trap stretch with GHJ overpressure 2 x 30 seconds each side   Cervical rotation stretch 2 x 30 seconds each   PATIENT EDUCATION:  Education details: HEP, POC, goals  Person educated: Patient Education method: Explanation, Demonstration, and Handouts Education comprehension: verbalized understanding and returned demonstration  HOME EXERCISE PROGRAM: Access Code: [email or web address] URL: [email or web address] Date: 06/22/2024 Prepared by: Maryanne Finder  Exercises - Seated Upper Trapezius Stretch  - 2-3 x daily - 5-7 x weekly - 3-5 reps - 30-60 seconds hold - Seated Levator Scapulae Stretch  - 2-3 x daily - 5-7 x weekly - 3-5 reps - 30-60 second hold - Seated Shoulder Rolls  - 2-3 x daily - 5-7 x weekly - 1 sets - 10 reps - Supine Suboccipital Release with Tennis Balls  - 2-3 x daily - 5-7 x weekly - as  needed --> 1-2 minutes  hold  ASSESSMENT:  CLINICAL IMPRESSION: Patient is a 53 y.o. female who was seen today for physical therapy evaluation and treatment for neck pain with L > R UE radiculopathy. Patient presents with decreased cervical ROM, BUE weakness, impaired sensation intermittently, decreased activity tolerance, and pain. Patient demonstrates improvement in LUE symptoms with repeated cervical distraction and B UT/levator stretching (manually and by patient).  Patient reports decrease in pain and B UE symptoms at end of session. Patient will benefit from skilled PT services to address listed impairments to improve quality of life and reduce pain.   OBJECTIVE IMPAIRMENTS: decreased balance, decreased endurance, decreased mobility, difficulty walking, decreased ROM, decreased strength, hypomobility, impaired flexibility, impaired sensation, postural dysfunction, and pain.   ACTIVITY LIMITATIONS: carrying, lifting, squatting, stairs, transfers, bathing, and dressing  PARTICIPATION LIMITATIONS: cleaning, laundry, and community activity  PERSONAL FACTORS: Age, Behavior  pattern, Past/current experiences, and Time since onset of injury/illness/exacerbation are also affecting patient's functional outcome.   REHAB POTENTIAL: Good  CLINICAL DECISION MAKING: Stable/uncomplicated  EVALUATION COMPLEXITY: Moderate   GOALS: Goals reviewed with patient? Yes  SHORT TERM GOALS: Target date: 07/20/2024  Patient will be independent in HEP to improve strength/mobility for better functional independence with ADLs. Baseline: 06/22/24: HEP initiated   Goal status: INITIAL  LONG TERM GOALS: Target date: 08/17/2024  Patient will reduce Neck Disability Index score to <10% to demonstrate minimal disability with ADL's including improved sleeping tolerance, sitting tolerance, etc for better mobility at home and work. Baseline: 06/22/24: 33/50 = 66%  Goal status: INITIAL  2.  Patient will report a worst pain of 3/10 on NRPS in neck to improve tolerance with ADLs and reduced symptoms with activities.  Baseline: 06/22/24: 10/10 worst  Goal status: INITIAL  3.  Patient will improve B UE strength by 1/3 MMT grade to demonstrate improvement in tolerance to activity and strength to perform ADLs independently.  Baseline: 06/22/24: see above Goal status: INITIAL  4.  Patient will improve cervical ROM in all directions by 10 degrees to improve ability to look in all directions while driving safely.  Baseline: 06/22/24: see above  Goal status: INITIAL  5.  Patient will report decrease in frequency of migraines to < 2 per week to demonstrate improvement in muscular tightness in cervical region and ability tolerate daily activities with reduction in pain.  Baseline: 06/22/24: daily  Goal status: INITIAL   PLAN:  PT FREQUENCY: 1-2x/week  PT DURATION: 8 weeks  PLANNED INTERVENTIONS: [id number]- PT Re-evaluation, 97750- Physical Performance Testing, 97110-Therapeutic exercises, 97530- Therapeutic activity, 97112- Neuromuscular re-education, 97535- Self Care, [id number]- Manual  therapy, [id number]- Electrical stimulation (unattended), [phone]- Electrical stimulation (manual), Patient/Family education, Joint mobilization, Joint manipulation, Spinal manipulation, Spinal mobilization, Cryotherapy, and Moist heat  PLAN FOR NEXT SESSION: HEP review, cervical distraction, manual therapy   Maryanne Finder, PT, DPT Physical Therapist - [place]  [facility] 06/22/2024, 9:58 AM

## 2024-06-22 ENCOUNTER — Ambulatory Visit: Attending: Orthopedic Surgery

## 2024-06-22 DIAGNOSIS — M542 Cervicalgia: Secondary | ICD-10-CM | POA: Insufficient documentation

## 2024-06-22 DIAGNOSIS — R202 Paresthesia of skin: Secondary | ICD-10-CM | POA: Insufficient documentation

## 2024-06-22 DIAGNOSIS — M6281 Muscle weakness (generalized): Secondary | ICD-10-CM | POA: Diagnosis not present

## 2024-06-22 DIAGNOSIS — M5412 Radiculopathy, cervical region: Secondary | ICD-10-CM | POA: Diagnosis not present

## 2024-06-22 DIAGNOSIS — Z981 Arthrodesis status: Secondary | ICD-10-CM | POA: Diagnosis not present

## 2024-06-22 DIAGNOSIS — R200 Anesthesia of skin: Secondary | ICD-10-CM | POA: Insufficient documentation

## 2024-06-22 DIAGNOSIS — M47812 Spondylosis without myelopathy or radiculopathy, cervical region: Secondary | ICD-10-CM | POA: Diagnosis not present

## 2024-06-23 ENCOUNTER — Encounter: Payer: Self-pay | Admitting: Nurse Practitioner

## 2024-06-24 ENCOUNTER — Telehealth: Payer: Self-pay

## 2024-06-24 ENCOUNTER — Ambulatory Visit: Admitting: Clinical

## 2024-06-24 NOTE — Telephone Encounter (Signed)
 SEE MY CHART MESSAGE.

## 2024-06-24 NOTE — Telephone Encounter (Signed)
 Copied from CRM #[id number]. Topic: Clinical - Lab/Test Results >> Jun 24, 2024  9:46 AM Rea ORN wrote: Reason for CRM: Pt called to get results of her CT scan.  Please call back.

## 2024-06-27 ENCOUNTER — Ambulatory Visit: Payer: Self-pay | Admitting: Nurse Practitioner

## 2024-06-27 NOTE — Telephone Encounter (Signed)
 FYI Only or Action Required?: Action required by provider: lab or test result follow-up needed.  Patient was last seen in primary care on 06/16/2024 by Gretel App, NP.  Called Nurse Triage reporting Imaging results.  Symptoms began several days ago.  Interventions attempted: Nothing.  Symptoms are: unchanged. Pt. Asking for imaging results.  Triage Disposition: See HCP Within 4 Hours (Or PCP Triage)  Patient/caregiver understands and will follow disposition?: No, wishes to speak with PCP    Copied from CRM #[id number]. Topic: Clinical - Red Word Triage >> Jun 27, 2024 11:02 AM Richerd A wrote: [facility] that prompted transfer to Nurse Triage: Patient is having stomach pain. Had CT but does not know results. CMA left message for patient to read Mychart and patient said she doesn't know what that means and she wants her results. Reason for Disposition  [1] MILD-MODERATE pain AND [2] constant AND [3] present > 2 hours  Answer Assessment - Initial Assessment Questions 1. LOCATION: Where does it hurt?      All the way across 2. RADIATION: Does the pain shoot anywhere else? (e.g., chest, back)     back 3. ONSET: When did the pain begin? (e.g., minutes, hours or days ago)      2 week ago 4. SUDDEN: Gradual or sudden onset?     gradual 5. PATTERN Does the pain come and go, or is it constant?     constant 6. SEVERITY: How bad is the pain?  (e.g., Scale 1-10; mild, moderate, or severe)     10 7. RECURRENT SYMPTOM: Have you ever had this type of stomach pain before? If Yes, ask: When was the last time? and What happened that time?      no 8. CAUSE: What do you think is causing the stomach pain? (e.g., gallstones, recent abdominal surgery)     no 9. RELIEVING/AGGRAVATING FACTORS: What makes it better or worse? (e.g., antacids, bending or twisting motion, bowel movement)     no 10. OTHER SYMPTOMS: Do you have any other symptoms? (e.g., back pain, diarrhea, fever,  urination pain, vomiting)       no 11. PREGNANCY: Is there any chance you are pregnant? When was your last menstrual period?       no  Protocols used: Abdominal Pain - [facility]

## 2024-06-27 NOTE — Telephone Encounter (Signed)
 Noted

## 2024-06-27 NOTE — Telephone Encounter (Signed)
 Called Patient with CT results and she states that the pain is still persistent. Patient states she will follow up with her PCP regarding this issue and the other issues.

## 2024-06-28 ENCOUNTER — Ambulatory Visit

## 2024-06-28 DIAGNOSIS — K8020 Calculus of gallbladder without cholecystitis without obstruction: Secondary | ICD-10-CM | POA: Insufficient documentation

## 2024-06-28 DIAGNOSIS — I10 Essential (primary) hypertension: Secondary | ICD-10-CM | POA: Diagnosis not present

## 2024-06-28 DIAGNOSIS — K828 Other specified diseases of gallbladder: Secondary | ICD-10-CM | POA: Diagnosis not present

## 2024-06-28 DIAGNOSIS — K8050 Calculus of bile duct without cholangitis or cholecystitis without obstruction: Secondary | ICD-10-CM | POA: Diagnosis not present

## 2024-06-28 DIAGNOSIS — R1011 Right upper quadrant pain: Secondary | ICD-10-CM | POA: Diagnosis not present

## 2024-06-29 ENCOUNTER — Ambulatory Visit

## 2024-06-29 DIAGNOSIS — I2510 Atherosclerotic heart disease of native coronary artery without angina pectoris: Secondary | ICD-10-CM | POA: Diagnosis not present

## 2024-06-30 ENCOUNTER — Encounter: Payer: Self-pay | Admitting: Nurse Practitioner

## 2024-06-30 DIAGNOSIS — E66813 Obesity, class 3: Secondary | ICD-10-CM | POA: Diagnosis not present

## 2024-06-30 DIAGNOSIS — K8010 Calculus of gallbladder with chronic cholecystitis without obstruction: Secondary | ICD-10-CM | POA: Diagnosis not present

## 2024-06-30 DIAGNOSIS — I10 Essential (primary) hypertension: Secondary | ICD-10-CM | POA: Diagnosis not present

## 2024-06-30 DIAGNOSIS — K8020 Calculus of gallbladder without cholecystitis without obstruction: Secondary | ICD-10-CM | POA: Diagnosis not present

## 2024-06-30 DIAGNOSIS — K828 Other specified diseases of gallbladder: Secondary | ICD-10-CM | POA: Diagnosis not present

## 2024-06-30 DIAGNOSIS — Z6841 Body Mass Index (BMI) 40.0 and over, adult: Secondary | ICD-10-CM | POA: Diagnosis not present

## 2024-07-02 NOTE — Discharge Summary (Signed)
 Discharge Summary  Admit date: 06/28/2024  Discharge date and time: 07/01/24  Discharge to:  Home  Discharge Service: General Surgery  Discharge Attending Physician: Lemond Grieves, MD  Discharge  Diagnoses: Symptomatic cholelithiasis  Secondary Diagnosis: Principal Problem:   Symptomatic cholelithiasis (POA: Unknown) Resolved Problems:   * No resolved hospital problems. *   OR Procedures:   Left Heart Catheterization Date 06/29/2024 ------------------- **Canceled**  ROBOTIC XI LAPAROSCOPY, SURGICAL; CHOLECYSTECTOMY Date 06/29/2024 -------------------  ROBOTIC XI LAPAROSCOPIC CHOLECYSTECTOMY WITH CHOLANGIOGRAPHY Date 06/30/2024 -------------------   Ancillary Procedures: no procedures  Discharge Day Services:   Subjective  No acute events overnight.  Hemodynamically stable and afebrile.  Tolerating a regular diet without issues, mild and appropriate abdominal pain around incision sites.  Able to ambulate and pain well-controlled with p.o. regimen.  Objective  No data found. I/O this shift: In: 2349 [P.O.:420; I.V.:1929] Out: 253 [Urine:250; Blood:3]  General Appearance:   No acute distress Lungs:                NWOB on RA Heart:                           Regular rate and rhythm Abdomen:                Soft, non-tender, non-distended Extremities:              Warm and well perfused  Hospital Course:  53 year old female with past medical history of hypertension, chronic pain syndrome, cervical spondylosis, seronegative spondyloarthropathy, obesity, and OSA who presented to the [facility] ED on 06/28/2024 for evaluation of right sided abdominal pain and nausea.  She had an outpatient CT A/P on 06/21/2024 -no acute findings, noted to have chronic gallbladder wall thickening, 2.3 cm left adnexal cyst, and mild diverticular disease of the sigmoid colon. ED workup included a RUQ ultrasound which showed cholelithiasis without evidence of acute cholecystitis.   Noted to have diffuse gallbladder wall thickening. Labs without leukocytosis or elevated liver enzymes.  She was admitted to the general surgery service for further treatment. Cardiology was consulted for assistance with risk stratification regarding her reported chest pain and dyspnea on exertion.  Cardiology recommended continuing Norvasc  5 mg daily, obtaining an echo, performing a left heart cath, telemetry, and serial EKGs.  On hospital day #2 (06/29/2024), she had no acute events overnight.  She continued to have intermittent worsening right upper quadrant pain. Echocardiogram was obtained which showed normal LVEF of 60-65%, RV was upper normal in size with normal systolic function.  IVC size and inspiratory change suggest elevated right atrial pressure.  Trivial MV regurgitation, trivial TR regurgitation, no pulmonic valve regurgitation. Underwent left heart cath which demonstrated mild coronary atherosclerosis but no obstructive disease. Cardiology recommended increasing Norvasc  to 10 mg daily, starting atorvastatin  80 mg daily, start aspirin 81 mg daily, continue hydrochlorothiazide  25 mg daily. She was cleared from cardiac standpoint for cholecystectomy.  On hospital day #3 (06/30/2024), lipid panel was sent per cardiology recommendations. She underwent a robotic cholecystectomy with IOC on 06/30/24.  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  Her cholangiogram was negative with contrast filling through that the common bile duct and freely flowing into the duodenum.  She tolerated the procedure well. She was extubated without complication and brought to the PACU for monitoring before being transferred back to the floor for supportive care.    She stayed overnight for  observation and pain control.  On hospital day 4 she was tolerating a regular diet without issues, pain was well-controlled with p.o. pain regimen, had  no nausea or vomiting and was able to ambulate without issues.  She was discharged home in stable condition and with appropriate follow-up.  Cardiology recommended PCP follow-up, and all of their medication changes were discussed with the patient and changed on her medication reconciliation for discharge.  Outpatient follow-up: [ ]  General Surgery clinic in 2 weeks.  Condition at Discharge: Improved Discharge Medications:    Medication List    START taking these medications   . aspirin 81 MG chewable tablet; Chew 1 tablet (81 mg total) by mouth  daily. . oxyCODONE  5 MG immediate release tablet; Commonly known as: ROXICODONE ;  Take 1 tablet (5 mg total) by mouth every six (6) hours as needed for pain  for up to 5 days.   CHANGE how you take these medications   . amlodipine  5 MG tablet; Commonly known as: NORVASC ; Take 2 tablets (10  mg total) by mouth in the morning.; What changed: how much to take . atorvastatin  80 MG tablet; Commonly known as: LIPITOR; Take 1 tablet (80  mg total) by mouth daily.; What changed: medication strength, how much to  take, when to take this   CONTINUE taking these medications   . acetaminophen  650 MG CR tablet; Commonly known as: TYLENOL  8 HOUR; Take  1 tablet (650 mg total) by mouth every eight (8) hours as needed for pain. . albuterol  90 mcg/actuation inhaler; Commonly known as: PROVENTIL   HFA;VENTOLIN  HFA . cetirizine  10 MG tablet; Commonly known as: ZYRTEC  . cholecalciferol (vitamin D3-125 mcg (5,000 unit)) 125 mcg (5,000 unit)  tablet . diphenhydrAMINE 25 mg capsule; Commonly known as: BENADRYL . DULoxetine  20 MG capsule; Commonly known as: CYMBALTA  . famotidine  20 MG tablet; Commonly known as: PEPCID ; Take 1 tablet (20 mg  total) by mouth two (2) times a day for 10 days. . fluticasone  propionate 50 mcg/actuation nasal spray; Commonly known as:  FLONASE  . hydroCHLOROthiazide  25 MG tablet; Commonly known as: HYDRODIURIL  .  HYDROcodone -acetaminophen  5-325 mg per tablet; Commonly known as: NORCO;  Take 1 tablet by mouth every six (6) hours as needed for pain. . * ibuprofen 600 MG tablet; Commonly known as: MOTRIN; Take 1 tablet (600  mg total) by mouth every six (6) hours as needed for pain. . * ibuprofen 600 MG tablet; Commonly known as: MOTRIN; Take 1 tablet (600  mg total) by mouth every six (6) hours as needed for pain. . lidocaine  4 % patch; Commonly known as: ASPERCREME; Place 1 patch on the  skin daily. . lisinopril -hydroCHLOROthiazide  20-12.5 mg per tablet; Commonly known as:  PRINZIDE ,ZESTORETIC  . losartan  100 MG tablet; Commonly known as: COZAAR  . meloxicam  15 MG tablet; Commonly known as: MOBIC  . montelukast  10 mg tablet; Commonly known as: SINGULAIR  . pantoprazole  40 MG tablet; Commonly known as: Protonix  . pregabalin  75 MG capsule; Commonly known as: LYRICA  * This list has 2 medication(s) that are the same as other medications  prescribed for you. Read the directions carefully, and ask your doctor or  other care provider to review them with you.    Pending Test Results:   Discharge Instructions: Activity:  Activity Instructions     Activity as tolerated     Lifting restrictions (specify)     Weight restriction of no lifting over 15lbs for 6 weeks.   OK to shower (no bath)    Additional  Activity Instructions:   Instructions after Anesthesia:  Do not drink alcoholic beverages, sign legal documents, or operate heavy machinery for the next 24 hours or while taking narcotic pain medications. You may feel light headed and/or dizzy. Get up slowly and rest for the next 24 hours even if you are feeling well.  If you experience the following please refer to the number provided by the surgeon or contact the doctor on call at [phone]:  If you are unable to urinate when your bladder feels full or if you have not urinated in 8 hours. If you are unable to tolerate fluids within 8 hours of  discharge.      Diet: Diet Instructions     Discharge diet (specify)     Discharge Nutrition Therapy: Regular   SLOWLY RESUME YOUR REGULAR DIET.       Nausea:  Occasionally patients experience nausea (upset stomach) following surgery which may be due to anesthesia or the pain medication you are taking. Eating small meals and drinking lots of fluids can help decrease nausea. It is important NOT to take your pain medication on an empty stomach as this can make your nausea worse.  If you experience persistent nausea or vomiting please contact your surgeon's office or the physician on call after hours.   Diet:  Resume your normal diet.  Gradually drink 8-10 glasses of non-caffeinated liquid per day.       Other Instructions: Other Instructions     Call MD for:  persistent nausea or vomiting     Call MD for:  redness, tenderness, or signs of infection (pain, swelling, redness, odor or green/yellow discharge around incision site)     Call MD for:  severe uncontrolled pain     Call MD for: Temperature > 38.5 Celsius ( > 101.3 Fahrenheit)     Discharge instructions     ROBOTIC CHOLECYSTECTOMY Your surgery will be done through 4 small incisions on your abdomen. The incisions will be closed with suture under your skin (the suture absorbs and does not need to be removed) and covered in surgical glue that will fall off, usually in a week or two.  Do not scrub your incisions.    You may shower 48 hours (2 days) after the procedure, but please avoid baths, hot tubs, pools, soaking or sitting in water for 2 weeks.  Please allow the soap and water to run over your incision.  Do not scrub your incision.    You may eat the same night of your surgery.  We encourage you to eat light meals for the first 24 hours because the anesthesia may make you nauseated.  Please avoid fried or greasy foods for the first month or so after surgery.  Some individuals may experience loose bowel movements for  several weeks after having their gallbladder out and this can be exacerbated by eating fried or greasy foods.  Do not lift anything heavy (over 15 pounds) and avoid very strenuous activity for 6 weeks.  However, listen to your body and if what you are doing hurts, stop.  We do encourage you to walk after your surgery.  You should start small, ie - walking to the bathroom or the kitchen, and increase your walking daily.  Bruising and swelling are normal in the days following your operation.  If you notice excessive bruising or note visible swelling around your incision sites, you may call for advice and/or report to your nearest emergency department.  Other signs  and symptoms that you should call the office about include a fever or greater than 101.57F, chills, yellowing of the skin or whites of the eyes, redness around the incisions, or your skin becoming hot around your incision, and any fluids coming out of your incisions.  You will be prescribed an oral narcotic pain medication; you do not have to take it if you do not need it, or it can be taken in a smaller dose and/or less often than written.  Alternatives to the prescription include Tylenol  650 mg every 6 hours and ibuprofen 800 mg every 8 hours; ibuprofen and Tylenol  can be taken together and if taken with the prescription, will make the prescription work better so less of the prescription needs to be taken.  DO NOT TAKE TYLENOL  OR IBUPROFEN IF YOU ARE ALLERGIC.  Do not drive or operate heavy machinery while taking narcotic pain medications.    The oral pain medication has a constipating effect, so we strongly recommend using a fiber supplement (such as Metamucil, Citrucel, Benefiber) and/or a stool softener (such as Colace) with your pain medication.  Walking and adequate hydration will also help you to have soft bowel movements.  If you are having difficulty with bowel movements, use over-the-counter Milk of Magnesia, 30ml (1oz) daily.  If this  does not produce the desired effect, you may increase this to 60ml (2oz) daily.  Drink 8oz of water following each dose for maximum benefit.  The cardiologists have recommended some changes to your home medications as described below. You should follow up with your PCP and inform them of this workup, please do so within the next couple of weeks. - Increase norvasc  to 10 mg daily - Start baby aspirin, 81 mg, daily - Increase atorvastatin  to 80 mg daily  We would like to see you in our clinic for follow-up in 2 weeks.  Please schedule your follow-up appointment by calling the phone numbers below: Scheduling: [phone]  RN / Clinic:  [phone] ext [phone]  If you have questions regarding your surgery during normal business hours, please call our RN/Clinic number at [phone].  After normal business hours, call [phone] and ask for the [facility] Surgery On Call.   In the case of an emergency after your surgery, we recommend you go to the nearest Emergency Department.    [facility] DEPARTMENT OF CARDIOLOGY CARDIAC CATHETERIZATION (RADIAL) DISCHARGE INSTRUCTIONS  SITE CARE:  Inspect and feel your wrist site before leaving the hospital and then every day for the next week. You should not feel any hard lumps or worse pain. You can take off the bandage over your wrist and shower after 24 hours. Wash the area gently with soap and water, and pat dry. Do Not Scrub. You may have some soreness or bruising in your wrist area, which should go away in a few days. You can put ice packs on the site or take over-the-counter Tylenol  to help with the soreness.  SIGNS AND SYMPTOMS TO REPORT:  If you have any of the following, please call us  at [phone] Monday to Friday 8:00-5:00: A fever above 100 degrees F If your wrist site has pus or foul-smelling drainage, is red, swollen, or feels hot to touch Unusual chest discomfort, back or shoulder pain Unusual swelling of hand or  arm Pain not relieved by acetaminophen  (aka Tylenol ) Bleeding visible through the bandage Severe pain, numbness, or tingling in hand or arm  If there is any oozing of blood  or swelling at the puncture site: Apply firm pressure for 20 minutes. This pressure will stop the bleeding and help a small clot to form. If you are unable to stop the bleeding, call 911 and continue holding pressure until help arrives.  If you are currently taking Metformin (Glucophage) for Diabetes Control: In some instances patients who take Metformin and have received IV contrast dye may develop kidney problems. While this is rare, and occurs primarily with patients who already have problems with their kidneys, we do not want you to take Metformin unless we are sure your kidneys are working properly. Please wait two days before restarting your metformin. After two days, if you are feeling okay and are urinating normally, you may start taking Metformin again. If you feel tired, have muscle aches, or feel like you are not urinating properly, please call the Cardiac Catheterization Lab at [phone], Monday through Friday from 8am to 4pm. Outside of these times, such as Nights, Weekends, or Holidays, please call the Hospital Operator at [phone] and ask for the Cardiology Fellow On Call. ___________________________________________________________________________________________________  ACTIVITY:  Take it easy for the next week. Ease back into exercise- start with walking briskly and increase level of exercise if you feel up to it. Avoid strenuous activity for 48 hours. Do not lift anything over 5 pounds for 5 days. For 5 days, avoid push-pull activities, such as vacuuming. You may return to work, school, and your usual activities one day after your procedure as long as your work does not involve hard labor, especially heavy lifting. You may take a shower 24 hours after your procedure. Do not submerge your wrist in  water for 5 days, such as cleaning dishes, taking a bath or swimming in a pool. Unless you are limited by your doctor, drink plenty of water to help clear the X-Ray Dye out of your kidneys.  GUIDELINES AFTER SEDATION: The medicines given to you today during your procedure will stay in your system for some time. You may feel dizzy, or lose your sense of balance. The use of your muscles may be different. Your judgement may be affected. Your reaction time will be slower. You may not notice these changes. In general you should be completely recovered from these medications in 24 hours. For you own safety, we have some rules for you to follow while recovering:  Do not Drive for 24 hours. Do not use any tools or equipment that could be Dangerous. This may be things like power tools, lawnmowers, gas burners, or garbage disposals. Watch out for Dizziness. Stand and walk slowly, take your time. Sudden changes in position may make you dizzy or nauseous. Do not make any important Decisions, or sign any documents for 24 hours. The medications you were given can impact your judgement, and you may change your mind as the medication wears off. Do not Drink Alcoholic Beverages. Alcohol may have dangerous interactions with the medications you were given. Diet: If you feel nauseous or sick to your stomach, drink clear liquids like water, apple juice, ginger ale, or tea. When you start to feel better, try soft, simple foods like oatmeal, rice, or pasta. Discuss any questions you have with your healthcare provider. The Cath Lab can be reached at [phone] Monday through Friday, 7:00am to 4:30pm. If you have any questions outside of these times, and need to speak to a Doctor, call [phone] and ask for the Cardiology Fellow On Call.   If you have questions about about your care,  call the Cardiac Cath Lab at [phone] Monday - Friday 8am - 5pm; at night or on weekends and holidays, you may call [facility] at  [phone] and ask for the Cardiology Fellow On Call      Labs and Other Follow-ups after Discharge: Follow Up instructions and Outpatient Referrals    Call MD for:  persistent nausea or vomiting     Call MD for:  redness, tenderness, or signs of infection (pain, swelling,  redness, odor or green/yellow discharge around incision site)     Call MD for:  severe uncontrolled pain     Call MD for: Temperature > 38.5 Celsius ( > 101.3 Fahrenheit)     Discharge instructions       Future Appointments: Appointments which have been scheduled for you    Oct 07, 2024 9:50 AM NEW OPHTHALMOLOGY with Thom Aleene Burrow, MD [facility] OPHTHALMOLOGY [address] [place] [facility] [address] [place] [address] [place] [id number] [phone]         It was medically necessary for me to see the patient. My substantive portion of the visit included review of the subjective findings with the patient, physical examination, review of the objective findings, creation of the assessment and plan, and patient counseling prior to discharge; less than 30 minutes of my time spent with the patient in discharge planning.   CHARM Lemond Grieves, MD 07/02/2024 8:10 AM

## 2024-07-04 ENCOUNTER — Telehealth: Payer: Self-pay

## 2024-07-04 ENCOUNTER — Encounter: Payer: Self-pay | Admitting: Nurse Practitioner

## 2024-07-04 NOTE — Transitions of Care (Post Inpatient/ED Visit) (Signed)
   07/04/2024  Name: RISE TRAEGER MRN: [id number] DOB: Dec 09, 1970  Today's TOC FU Call Status: Today's TOC FU Call Status:: Unsuccessful Call (1st Attempt) Unsuccessful Call (1st Attempt) Date: 07/04/24  Attempted to reach the patient regarding the most recent Inpatient/ED visit.  Follow Up Plan: Additional outreach attempts will be made to reach the patient to complete the Transitions of Care (Post Inpatient/ED visit) call.   Arvin Seip RN, BSN, CCM [employer], Population Health Case Manager Phone: [phone]

## 2024-07-05 ENCOUNTER — Ambulatory Visit

## 2024-07-05 ENCOUNTER — Encounter: Payer: Self-pay | Admitting: Family

## 2024-07-05 ENCOUNTER — Ambulatory Visit: Admitting: Family

## 2024-07-05 ENCOUNTER — Other Ambulatory Visit (HOSPITAL_COMMUNITY)
Admission: RE | Admit: 2024-07-05 | Discharge: 2024-07-05 | Disposition: A | Discharge status: Home / Self Care | Source: Ambulatory Visit | Attending: Family | Admitting: Family

## 2024-07-05 VITALS — BP 134/78 | HR 62 | Temp 97.6°F | Ht 63.0 in | Wt 237.6 lb

## 2024-07-05 DIAGNOSIS — N760 Acute vaginitis: Secondary | ICD-10-CM | POA: Diagnosis not present

## 2024-07-05 MED ORDER — FLUCONAZOLE 150 MG PO TABS: 150.0000 mg | ORAL_TABLET | ORAL | 0 refills | Status: DC

## 2024-07-05 NOTE — Progress Notes (Signed)
 Acute Office Visit  Subjective:     Patient ID: Anita Gibbs, female    DOB: May 04, 1971, 53 y.o.   MRN: [id number]  Chief Complaint  Patient presents with  . Vaginal Itching    HPI Patient is in today with complaints of vaginal itching over the last week and worsening.  She has a history of bacterial vaginosis.  However, she denies any fishy or musty odor.  She has not been sexually active.  Has been on antibiotics recently for bacterial vaginosis.  And also reports using the [employer] body wash in her vaginal area.  Review of Systems  Constitutional: Negative.   Respiratory: Negative.    Cardiovascular: Negative.   Genitourinary:  Negative for dysuria and urgency.       Vaginal itching  Musculoskeletal: Negative.   Skin: Negative.   Psychiatric/Behavioral: Negative.    All other systems reviewed and are negative.  Past Medical History:  Diagnosis Date  . Abdominal pain 06/11/2020  . Abnormal drug screen 03/06/2020  . Abnormal MRI, cervical spine (06/10/2018) 09/27/2018   FINDINGS:  Vertebrae: fusion hardware at C4, C5, C6, and C7.  Posterior Fossa, vertebral arteries, paraspinal tissues: A relatively empty sella present.     Disc levels:  C3-4: Negative. A mild broad-based disc osteophyte complex present. Uncovertebral spurring contributes to mild foraminal narrowing bilaterally.  C4-5: A leftward disc osteophyte complex is present. Uncovertebral and facet disease  . Acute cystitis without hematuria 06/18/2020  . Acute vaginitis 06/11/2020  . Adenoma of left adrenal gland 08/01/2020  . Adrenal nodule 06/20/2020  . Allergy    . ANA positive 02/01/2021  . Anxiety and depression 01/29/2021  . Arthritis   . Back pain with history of spinal surgery 01/29/2021  . Bilateral carpal tunnel syndrome 01/29/2021  . Bilateral leg edema 01/29/2021  . BMI 40.0-44.9, adult (HCC) 04/30/2022  . Carpal boss, right   . Carpal tunnel syndrome, bilateral   . Cervical central  spinal stenosis (C4-5) 09/27/2018   Levels:  C4-5: There is partial effacement of ventral CSF.     IMPRESSION:  Mild residual central canal narrowing at C4-5 s/p ACDF.  SABRA Cervical facet hypertrophy (C3-T1) 09/27/2018   Levels:  C3-4: Uncovertebral spurring  C4-5: Uncovertebral and facet disease  C5-6: Residual uncovertebral disease.  C6-7: Residual uncovertebral disease.  C7-T1: Minimal left-sided uncovertebral spurring  . Cervical facet joint syndrome (Bilateral) (L>R) 09/27/2018  . 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-5, C5-6, and C6-7) 09/27/2018   Levels:  C3-4: Mild foraminal narrowing bilaterally.  C4-5: Mild left foraminal narrowing.  C5-6: Mild left foraminal narrowing is due to residual uncovertebral disease.  C6-7: Mild left foraminal narrowing is due to residual uncovertebral disease.  . Cervical myelopathy (HCC) 07/01/2017  . Cervicalgia (Primary Area of Pain) (Bilateral) (L>R) 09/27/2018  . Chronic gout of foot (Left) 05/31/2018  . Chronic low back pain [facility] Area of Pain) (Bilateral) (R>L) w/ sciatica (Bilateral) 09/07/2018  . Chronic low back pain [facility] Area of Pain) (Bilateral) (R>L) w/o sciatica 07/26/2018  . Chronic lower extremity pain (Fourth Area of Pain) (Bilateral) (R>L) 09/07/2018  . Chronic musculoskeletal pain 10/27/2018  . Chronic neck pain (Bilateral) w/ history of cervical spinal surgery 09/27/2018  . Chronic neck pain (Primary Area of Pain) (Bilateral) (L>R) 09/07/2018  . Chronic sacroiliac joint dysfunction (Bilateral) 09/27/2018  . Chronic sacroiliac joint pain (Right) 09/07/2018  . Chronic upper extremity pain (Secondary Area of Pain) (Bilateral) (R>L) 09/27/2018  . Disorder of skeletal  system 09/07/2018  . E. coli UTI (urinary tract infection) 06/20/2020  . Elevated sed rate 09/08/2018  . Elevated serum creatinine 06/20/2020  . Excessive daytime sleepiness 03/06/2020  . Female pelvic pain 09/05/2020  . GERD (gastroesophageal reflux  disease)   . Headache   . History of allergy  to shellfish 09/28/2018  . History of fusion of cervical spine (ACDF C4-C7) 09/27/2018  . History of illicit drug use 03/07/2020  . Hives 09/05/2020  . Hives 09/05/2020  . Hyperkalemia 05/31/2018   Mild  . Hyperlipidemia 04/30/2022  . Hypertension   . Large breasts 05/30/2020  . Left hip pain 07/21/2022  . Lumbar facet arthropathy 04/20/2019  . Lumbar radiculitis (L5 dermatome) (Right) 04/17/2020  . Multiple environmental allergies 01/17/2020  . Multiple food allergies 03/07/2020  . Neurogenic pain 10/27/2018  . Numbness and tingling of both feet 05/31/2018  . 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  . Other intervertebral disc degeneration, lumbar region 11/15/2019  . Pharmacologic therapy 09/07/2018  . Plantar fasciitis, bilateral 03/04/2021  . Polyneuropathy 01/29/2021  . Prediabetes 10/10/2019  . Problems influencing health status 09/07/2018  . Rash 08/08/2019  . Seasonal allergic rhinitis due to pollen 11/29/2018  . Seasonal allergies 03/06/2020  . Seasonal asthma 11/29/2018  . Sjogren's disease   . Sleep apnea   . Somatic dysfunction of sacroiliac joint (Bilateral) 09/27/2018  . Spondylosis without myelopathy or radiculopathy, cervical region 03/22/2019  . Spondylosis without myelopathy or radiculopathy, lumbosacral region 05/19/2019  . Spondylosis, cervical, w/ myelopathy 09/27/2018  . URI with cough and congestion 06/16/2024  . Vaginal discharge 06/11/2020    Social History   Socioeconomic History  . Marital status: Widowed    Spouse name: Not on file  . Number of children: 2  . Years of education: Not on file  . Highest education level: Associate degree: occupational, scientist, product/process development, or vocational program  Occupational History  . Not on file  Tobacco Use  . Smoking status: Never  . Smokeless tobacco: Never  Vaping Use  . Vaping status: Never Used  Substance and Sexual Activity  . Alcohol use:  Yes    Alcohol/week: 2.0 standard drinks of alcohol    Types: 2 Glasses of wine per week    Comment: occ  . Drug use: No  . Sexual activity: Yes    Birth control/protection: None  Other Topics Concern  . Not on file  Social History Narrative   Bus driver    And school custodian    1 daughter lives in [place] 30 y.o as of 01/17/20   1 son 14 almost 98 in [place]   [employer]    Social Drivers of Health   Financial Resource Strain: High Risk (06/15/2024)   Overall Financial Resource Strain (CARDIA)   . Difficulty of Paying Living Expenses: Very hard  Food Insecurity: Food Insecurity Present (06/15/2024)   Hunger Vital Sign   . Worried About Programme Researcher, Broadcasting/film/video in the Last Year: Often true   . Ran Out of Food in the Last Year: Often true  Transportation Needs: No Transportation Needs (06/15/2024)   PRAPARE - Transportation   . Lack of Transportation (Medical): No   . Lack of Transportation (Non-Medical): No  Physical Activity: Inactive (06/15/2024)   Exercise Vital Sign   . Days of Exercise per Week: 0 days   . Minutes of Exercise per Session: Not on file  Stress: Stress Concern Present (06/15/2024)   [employer] of Occupational Health - Occupational  Stress Questionnaire   . Feeling of Stress: Rather much  Social Connections: Socially Isolated (06/15/2024)   Social Connection and Isolation Panel   . Frequency of Communication with Friends and Family: Twice a week   . Frequency of Social Gatherings with Friends and Family: Never   . Attends Religious Services: More than 4 times per year   . Active Member of Clubs or Organizations: No   . Attends Banker Meetings: Not on file   . Marital Status: Widowed  Intimate Partner Violence: Not on file    Past Surgical History:  Procedure Laterality Date  . ABDOMINAL HYSTERECTOMY  10/17/2020   [facility]; she has both ovaries  . ANTERIOR CERVICAL DECOMP/DISCECTOMY FUSION N/A 07/01/2017   Procedure:  ANTERIOR CERVICAL DECOMPRESSION/DISCECTOMY FUSION 3 LEVELS;  Surgeon: Clois Fret, MD;  Location: [facility] ORS;  Service: Neurosurgery;  Laterality: N/A;  . CARPAL TUNNEL RELEASE Bilateral   . KNEE ARTHROSCOPY Right     Family History  Problem Relation Age of Onset  . [name] Mother   . Diabetes Mother   . Arthritis Mother   . Hypertension Mother   . Hyperlipidemia Mother   . Hypertension Father   . Arthritis Father   . Hearing loss Father   . Kidney disease Father   . Lymphoma Father        large b cell   . Arthritis Sister   . Hypertension Sister   . Cancer Brother        ? lung due to exposure   . COPD Brother   . Early death Brother   . Post-traumatic stress disorder Brother   . Breast cancer Maternal Aunt   . Breast cancer Paternal Aunt   . Sleep apnea Daughter   . COPD Son   . Depression Son   . Post-traumatic stress disorder Son   . Allergic rhinitis Neg Hx   . Angioedema Neg Hx   . Eczema Neg Hx   . Urticaria Neg Hx   . Asthma Neg Hx     Allergies  Allergen Reactions  . Other Hives    White and red sauces  . Pineapple     Swelling  Other Reaction(s): Not available  pineapple allergenic extract  . Pineapple Extract Swelling    pineapple extract  . Shellfish Allergy  Hives  . Strawberry (Diagnostic) Hives  . Tomato Hives    cherry  Solanum (organism)    Current Outpatient Medications on File Prior to Visit  Medication Sig Dispense Refill  . albuterol  (VENTOLIN  HFA) 108 (90 Base) MCG/ACT inhaler Inhale 2 puffs into the lungs every 6 (six) hours as needed for wheezing or shortness of breath. 8 g 11  . amLODipine  (NORVASC ) 5 MG tablet Take 1 tablet (5 mg total) by mouth daily. (Patient taking differently: Take 5 mg by mouth daily. Pt stated that she is taking 10 mg instead of 5 mg) 90 tablet 0  . atorvastatin  (LIPITOR) 10 MG tablet Take 1 tablet by mouth on Monday, Wednesday and Friday. 90 tablet 3  . busPIRone  (BUSPAR ) 10 MG tablet Take 2 tablets  (20 mg total) by mouth 2 (two) times daily.    . butalbital -acetaminophen -caffeine  (FIORICET) 50-325-40 MG tablet Take 1 tablet by mouth every 6 (six) hours as needed for headache. 30 tablet 0  . cetirizine  (ZYRTEC  ALLERGY ) 10 MG tablet Take 1 tablet (10 mg total) by mouth at bedtime as needed. 90 tablet 3  . Cholecalciferol (VITAMIN D -3) 125 MCG (5000 UT) TABS  Take by mouth daily.    . Cyanocobalamin  (B-12 PO) Take by mouth.    . dicyclomine  (BENTYL ) 20 MG tablet TAKE 1 TABLET BY MOUTH FOUR TIMES DAILY (BEFORE MEALS AND AT BEDTIME) 120 tablet 5  . DULoxetine  (CYMBALTA ) 60 MG capsule Take 1 capsule (60 mg total) by mouth 2 (two) times daily. 60 capsule 2  . ENBREL SURECLICK 50 MG/ML injection Inject into the skin.    . EPINEPHrine  (AUVI-Q ) 0.3 mg/0.3 mL IJ SOAJ injection Use as directed for severe allergic reaction 2 Device 2  . famotidine  (PEPCID ) 20 MG tablet Take 1 tablet (20 mg total) by mouth 2 (two) times daily. 60 tablet 0  . fluticasone  (FLONASE ) 50 MCG/ACT nasal spray Place 2 sprays into both nostrils daily. 16 g 6  . hydrochlorothiazide  (HYDRODIURIL ) 25 MG tablet     . hydrocortisone  2.5 % cream Apply topically 2 (two) times daily. Prn to eyelids 30 g 0  . hydrOXYzine  (VISTARIL ) 25 MG capsule Take 1 capsule (25 mg total) by mouth 3 (three) times daily. 90 capsule 2  . losartan  (COZAAR ) 100 MG tablet Take 1 tablet (100 mg total) by mouth daily. 90 tablet 3  . Melatonin 10 MG CAPS Take 10 mg by mouth at bedtime. 30 capsule 6  . methocarbamol  (ROBAXIN ) 750 MG tablet Take 1 tablet (750 mg total) by mouth every 8 (eight) hours as needed for muscle spasms. 90 tablet 11  . montelukast  (SINGULAIR ) 10 MG tablet Take 1 tablet (10 mg total) by mouth at bedtime. 90 tablet 3  . ondansetron  (ZOFRAN -ODT) 4 MG disintegrating tablet Take 1 tablet (4 mg total) by mouth every 8 (eight) hours as needed for nausea or vomiting. 20 tablet 0  . pantoprazole  (PROTONIX ) 40 MG tablet TAKE ONE TABLET EVERY DAY 30  MIN BEFORE DINNER 90 tablet 3  . polyethylene glycol powder (GLYCOLAX /MIRALAX ) 17 GM/SCOOP powder Take 17 g by mouth 2 (two) times daily as needed. 3350 g 1  . potassium chloride  SA (KLOR-CON  M) 20 MEQ tablet Take 1 tablet (20 mEq total) by mouth daily as needed. With lasix  30 tablet 0  . predniSONE  (DELTASONE ) 20 MG tablet Take 2 tablets (40 mg total) by mouth daily. 10 tablet 0  . pregabalin  (LYRICA ) 100 MG capsule Take 1 capsule (100 mg total) by mouth 3 (three) times daily. 90 capsule 2  . saccharomyces boulardii (FLORASTOR) 250 MG capsule Take 1 capsule (250 mg total) by mouth daily. 90 capsule 0  . tirzepatide  (ZEPBOUND ) 2.5 MG/0.5ML Pen Inject 2.5 mg into the skin once a week. 6 mL 0  . traMADol  (ULTRAM -ER) 100 MG 24 hr tablet Take 1 tablet (100 mg total) by mouth daily. 30 tablet 0  . traZODone  (DESYREL ) 50 MG tablet Take 1-2 tablets (50-100 mg total) by mouth at bedtime as needed for sleep. 60 tablet 1  . triamcinolone  cream (KENALOG ) 0.1 % Apply 1 Application topically 2 (two) times daily. 30 g 0   No current facility-administered medications on file prior to visit.    BP 134/78   Pulse 62   Temp 97.6 F (36.4 C) (Oral)   Ht 5' 3 (1.6 m)   Wt 237 lb 9.6 oz (107.8 kg)   LMP  (LMP Unknown)   SpO2 99%   BMI 42.09 kg/m chart      Objective:    BP 134/78   Pulse 62   Temp 97.6 F (36.4 C) (Oral)   Ht 5' 3 (1.6 m)   Wt  237 lb 9.6 oz (107.8 kg)   LMP  (LMP Unknown)   SpO2 99%   BMI 42.09 kg/m    Physical Exam Vitals and nursing note reviewed. Exam conducted with a chaperone present.  Constitutional:      Appearance: Normal appearance. She is normal weight.  Cardiovascular:     Rate and Rhythm: Normal rate and regular rhythm.  Pulmonary:     Effort: Pulmonary effort is normal.     Breath sounds: Normal breath sounds.  Genitourinary:    General: Normal vulva.     Rectum: Normal.     Comments: Thick white vaginal discharge Skin:    General: Skin is warm and  dry.  Neurological:     General: No focal deficit present.     Mental Status: She is alert and oriented to person, place, and time.  Psychiatric:        Mood and Affect: Mood normal.        Behavior: Behavior normal.    No results found for any visits on 07/05/24.      Assessment & Plan:   Problem List Items Addressed This Visit   None Visit Diagnoses       Acute vaginitis    -  Primary   Relevant Orders   Cervicovaginal ancillary only( [facility])       Meds ordered this encounter  Medications  . fluconazole  (DIFLUCAN ) 150 MG tablet    Sig: Take 1 tablet (150 mg total) by mouth once a week.    Dispense:  2 tablet    Refill:  0   Call the office if symptoms worsen or persist.  Recheck as scheduled and sooner as needed. No follow-ups on file.  Xiomar Crompton B Jayleena Stille, FNP

## 2024-07-06 ENCOUNTER — Ambulatory Visit
Admission: RE | Admit: 2024-07-06 | Discharge: 2024-07-06 | Disposition: A | Discharge status: Home / Self Care | Source: Ambulatory Visit | Attending: Nurse Practitioner | Admitting: Nurse Practitioner

## 2024-07-06 ENCOUNTER — Other Ambulatory Visit: Payer: Self-pay

## 2024-07-06 ENCOUNTER — Ambulatory Visit: Payer: Self-pay | Admitting: Family

## 2024-07-06 DIAGNOSIS — N949 Unspecified condition associated with female genital organs and menstrual cycle: Secondary | ICD-10-CM | POA: Insufficient documentation

## 2024-07-06 LAB — CERVICOVAGINAL ANCILLARY ONLY
Bacterial Vaginitis (gardnerella): NEGATIVE
Candida Glabrata: NEGATIVE
Candida Vaginitis: POSITIVE — AB
Comment: NEGATIVE
Comment: NEGATIVE
Comment: NEGATIVE

## 2024-07-07 DIAGNOSIS — G4733 Obstructive sleep apnea (adult) (pediatric): Secondary | ICD-10-CM | POA: Diagnosis not present

## 2024-07-07 DIAGNOSIS — G959 Disease of spinal cord, unspecified: Secondary | ICD-10-CM | POA: Diagnosis not present

## 2024-07-12 ENCOUNTER — Ambulatory Visit: Attending: Orthopedic Surgery

## 2024-07-12 DIAGNOSIS — M542 Cervicalgia: Secondary | ICD-10-CM | POA: Diagnosis present

## 2024-07-12 DIAGNOSIS — M5459 Other low back pain: Secondary | ICD-10-CM | POA: Insufficient documentation

## 2024-07-12 DIAGNOSIS — M6281 Muscle weakness (generalized): Secondary | ICD-10-CM | POA: Insufficient documentation

## 2024-07-12 DIAGNOSIS — M5412 Radiculopathy, cervical region: Secondary | ICD-10-CM | POA: Insufficient documentation

## 2024-07-12 NOTE — Therapy (Signed)
 OUTPATIENT PHYSICAL THERAPY CERVICAL TREATMENT   Patient Name: Anita Gibbs MRN: [id number] DOB:Jul 14, 1971, 53 y.o., female Today's Date: 07/12/2024  END OF SESSION:  PT End of Session - 07/12/24 1031     Visit Number 2    Number of Visits 17    Date for Recertification  08/17/24    Authorization Type [employer] auth#[id number] for 8 PT vsts from 10/28-12/17/25    Authorization Time Period 10/28-12/17/25    Authorization - Visit Number 1    Authorization - Number of Visits 8    PT Start Time 1031    PT Stop Time 1115    PT Time Calculation (min) 44 min    Activity Tolerance Patient tolerated treatment well    Behavior During Therapy [facility] for tasks assessed/performed           Past Medical History:  Diagnosis Date   Abdominal pain 06/11/2020   Abnormal drug screen 03/06/2020   Abnormal MRI, cervical spine (06/10/2018) 09/27/2018   FINDINGS:  Vertebrae: fusion hardware at C4, C5, C6, and C7.  Posterior Fossa, vertebral arteries, paraspinal tissues: A relatively empty sella present.     Disc levels:  C3-4: Negative. A mild broad-based disc osteophyte complex present. Uncovertebral spurring contributes to mild foraminal narrowing bilaterally.  C4-5: A leftward disc osteophyte complex is present. Uncovertebral and facet disease   Acute cystitis without hematuria 06/18/2020   Acute vaginitis 06/11/2020   Adenoma of left adrenal gland 08/01/2020   Adrenal nodule 06/20/2020   Allergy     ANA positive 02/01/2021   Anxiety and depression 01/29/2021   Arthritis    Back pain with history of spinal surgery 01/29/2021   Bilateral carpal tunnel syndrome 01/29/2021   Bilateral leg edema 01/29/2021   BMI 40.0-44.9, adult (HCC) 04/30/2022   Carpal boss, right    Carpal tunnel syndrome, bilateral    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) 09/27/2018   Levels:  C4-5: There is partial effacement of ventral CSF.     IMPRESSION:  Mild residual central canal narrowing at C4-5 s/p ACDF.    Cervical facet hypertrophy (C3-T1) 09/27/2018   Levels:  C3-4: Uncovertebral spurring  C4-5: Uncovertebral and facet disease  C5-6: Residual uncovertebral disease.  C6-7: Residual uncovertebral disease.  C7-T1: Minimal left-sided uncovertebral spurring   Cervical facet joint syndrome (Bilateral) (L>R) 09/27/2018   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-5, C5-6, and C6-7) 09/27/2018   Levels:  C3-4: Mild foraminal narrowing bilaterally.  C4-5: Mild left foraminal narrowing.  C5-6: Mild left foraminal narrowing is due to residual uncovertebral disease.  C6-7: Mild left foraminal narrowing is due to residual uncovertebral disease.   Cervical myelopathy (HCC) 07/01/2017   Cervicalgia (Primary Area of Pain) (Bilateral) (L>R) 09/27/2018   Chronic gout of foot (Left) 05/31/2018   Chronic low back pain [facility] Area of Pain) (Bilateral) (R>L) w/ sciatica (Bilateral) 09/07/2018   Chronic low back pain [facility] Area of Pain) (Bilateral) (R>L) w/o sciatica 07/26/2018   Chronic lower extremity pain (Fourth Area of Pain) (Bilateral) (R>L) 09/07/2018   Chronic musculoskeletal pain 10/27/2018   Chronic neck pain (Bilateral) w/ history of cervical spinal surgery 09/27/2018   Chronic neck pain (Primary Area of Pain) (Bilateral) (L>R) 09/07/2018   Chronic sacroiliac joint dysfunction (Bilateral) 09/27/2018   Chronic sacroiliac joint pain (Right) 09/07/2018   Chronic upper extremity pain (Secondary Area of Pain) (Bilateral) (R>L) 09/27/2018   Disorder of skeletal system 09/07/2018   E. coli UTI (urinary tract infection) 06/20/2020   Elevated sed rate  09/08/2018   Elevated serum creatinine 06/20/2020   Excessive daytime sleepiness 03/06/2020   Female pelvic pain 09/05/2020   GERD (gastroesophageal reflux disease)    Headache    History of allergy  to shellfish 09/28/2018   History of fusion of cervical spine (ACDF C4-C7) 09/27/2018   History of illicit drug use 03/07/2020   Hives 09/05/2020    Hives 09/05/2020   Hyperkalemia 05/31/2018   Mild   Hyperlipidemia 04/30/2022   Hypertension    Large breasts 05/30/2020   Left hip pain 07/21/2022   Lumbar facet arthropathy 04/20/2019   Lumbar radiculitis (L5 dermatome) (Right) 04/17/2020   Multiple environmental allergies 01/17/2020   Multiple food allergies 03/07/2020   Neurogenic pain 10/27/2018   Numbness and tingling of both feet 05/31/2018   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   Other intervertebral disc degeneration, lumbar region 11/15/2019   Pharmacologic therapy 09/07/2018   Plantar fasciitis, bilateral 03/04/2021   Polyneuropathy 01/29/2021   Prediabetes 10/10/2019   Problems influencing health status 09/07/2018   Rash 08/08/2019   Seasonal allergic rhinitis due to pollen 11/29/2018   Seasonal allergies 03/06/2020   Seasonal asthma 11/29/2018   Sjogren's disease    Sleep apnea    Somatic dysfunction of sacroiliac joint (Bilateral) 09/27/2018   Spondylosis without myelopathy or radiculopathy, cervical region 03/22/2019   Spondylosis without myelopathy or radiculopathy, lumbosacral region 05/19/2019   Spondylosis, cervical, w/ myelopathy 09/27/2018   URI with cough and congestion 06/16/2024   Vaginal discharge 06/11/2020   Past Surgical History:  Procedure Laterality Date   ABDOMINAL HYSTERECTOMY  10/17/2020   [facility]; she has both ovaries   ANTERIOR CERVICAL DECOMP/DISCECTOMY FUSION N/A 07/01/2017   Procedure: ANTERIOR CERVICAL DECOMPRESSION/DISCECTOMY FUSION 3 LEVELS;  Surgeon: Clois Fret, MD;  Location: [facility] ORS;  Service: Neurosurgery;  Laterality: N/A;   CARPAL TUNNEL RELEASE Bilateral    KNEE ARTHROSCOPY Right    Patient Active Problem List   Diagnosis Date Noted   URI with cough and congestion 06/16/2024   Hypokalemia 06/09/2024   Nausea 06/08/2024   Abdominal bloating 05/05/2024   Cervical radicular pain 03/22/2024   Bilateral occipital neuralgia 03/22/2024   Sjogren  syndrome with inflammatory arthritis 03/21/2024   MDD (major depressive disorder), recurrent episode, severe (HCC) 03/16/2024   Insomnia 02/25/2024   MDD (major depressive disorder), recurrent episode, moderate (HCC) 01/18/2024   OSA (obstructive sleep apnea) 01/18/2024   Seronegative spondyloarthropathy 01/05/2024   Episodic tension-type headache, not intractable 01/05/2024   Palpitations 11/24/2023   Wasp sting 11/24/2023   AKI (acute kidney injury) 11/02/2023   Other chest pain 10/29/2023   GAD (generalized anxiety disorder) 09/30/2023   Lower abdominal pain 07/22/2023   Abnormal glucose 04/24/2023   Mood disorder 04/24/2023   Annual physical exam 04/24/2023   Anemia 04/24/2023   Chronic frontal sinusitis 10/08/2022   Abdominal pain, left lower quadrant 06/30/2022   Morbid obesity with BMI of 40.0-44.9, adult (HCC) 04/30/2022   Allergic rhinitis 03/04/2021   Bilateral leg edema 01/29/2021   Lumbosacral radiculopathy at L5 01/29/2021   Neuropathy 01/29/2021   Vaginal discharge 06/11/2020   Bacterial vaginosis 06/11/2020   Pain medication agreement broken 03/07/2020   Prediabetes 10/10/2019   HLD (hyperlipidemia) 10/10/2019   Gastroesophageal reflux disease 09/06/2019   Fibroid uterus 09/06/2019   Seasonal asthma 11/29/2018   DDD (degenerative disc disease), cervical 09/27/2018   Cervical spondylosis 09/27/2018   DDD (degenerative disc disease), lumbar 09/27/2018   Vitamin D  deficiency 09/27/2018   Chronic pain syndrome 09/07/2018  Essential hypertension 05/31/2018    PCP: Gretel App, NP  REFERRING PROVIDER: Hilma Hastings, PA-C  REFERRING DIAG:  [id number] (ICD-10-CM) - S/P cervical spinal fusion M47.812 (ICD-10-CM) - Cervical spondylosis M54.12 (ICD-10-CM) - Cervical radiculopathy R20.0,R20.2 (ICD-10-CM) - Numbness and tingling in left hand  THERAPY DIAG:  Muscle weakness (generalized)  Cervicalgia  Radiculopathy, cervical region  Rationale for Evaluation and  Treatment: Rehabilitation  ONSET DATE: Chronic   SUBJECTIVE:                                                                                                                                                                                                         SUBJECTIVE STATEMENT: Patient reports she is having more pain recently and getting migraines. She is having trouble with dropping items in her hands. No issues with grip but maintaining the grip is difficult. The pain is more on the L than R. Patient reports if she turns her head too many times, she may get lightheaded.    Hand dominance: Left  PERTINENT HISTORY:  She is s/p C4-C7 ACDF on 07/01/17 by Dr. Clois. She continues with constant neck pain radiating down left arm to the hand causing numbness and tingling. She has intermittent pain in right harm. She has weakness in both hands.  PMH of HTN, asthma, GERD, chronic pain syndrome, hyperlipidemia, obesity.  PAIN:  Are you having pain? Yes: NPRS scale: 7/10; worst 10/10 Pain location: neck Pain description: aching, dull, throbbing Aggravating factors: housework, any activity  Relieving factors: heat, pain medication  PRECAUTIONS: None  RED FLAGS: Cervical red flags: Diplopia Yes:       WEIGHT BEARING RESTRICTIONS: No  FALLS:  Has patient fallen in last 6 months? No  LIVING ENVIRONMENT: Lives with: lives alone Lives in: House/apartment Has following equipment at home: Single point cane  OCCUPATION: not working, trying to get disability   PLOF: Independent - occasional assistance with ADLs when in severe pain  PATIENT GOALS: to see if I can get some type of relief    OBJECTIVE:  Note: Objective measures were completed at Evaluation unless otherwise noted.  DIAGNOSTIC FINDINGS:  N/A   PATIENT SURVEYS:  NDI:  NECK DISABILITY INDEX  Date: 06/22/2024 Score  Pain intensity 3 = The pain is fairly severe at the moment  2. Personal care (washing, dressing,  etc.) 3 =  I need some help but can manage most of my personal care  3. Lifting 4 =  I can only lift very light weights  4. Reading 3 = I can't read as much  as I want because of moderate pain in my neck  5. Headaches 4 = I have severe headaches, which come frequently   6. Concentration 2 = I have a fair degree of difficulty in concentrating when I want to  7. Work 4 = I can hardly do any work at all  8. Driving 2 =  I can drive my car as long as I want with moderate pain in my neck  9. Sleeping 5 =  My sleep is completely disturbed (5-7 hrs sleepless)   10. Recreation 3 = I am able to engage in a few of my usual recreation activities because of pain in   my neck  Total 33/50 = 66%   Minimum Detectable Change (90% confidence): 5 points or 10% points  COGNITION: Overall cognitive status: Within functional limits for tasks assessed  SENSATION: Intermittent numbness/tingling in B hands (L>R)  POSTURE: rounded shoulders and forward head  PALPATION: Tightness noted to B UT, cervical paraspinals, suboccipitals  Tenderness noted but patient reports this is likely due to autoimmune disorder.   CERVICAL ROM:   Active ROM A/PROM (deg) eval  Flexion 25  Extension 28  Right lateral flexion 25  Left lateral flexion 15  Right rotation 10  Left rotation 20   (Blank rows = not tested)  UPPER EXTREMITY MMT:  MMT Right eval Left eval  Shoulder flexion 4 4  Shoulder extension    Shoulder abduction 4 4  Shoulder adduction    Shoulder extension    Shoulder internal rotation    Shoulder external rotation    Elbow flexion 4 4  Elbow extension 4 4  Wrist flexion    Wrist extension    Wrist ulnar deviation    Wrist radial deviation    Wrist pronation    Wrist supination     (Blank rows = not tested)  UPPER EXTREMITY ROM:  AROM WFL   CERVICAL SPECIAL TESTS:  Distraction test: Positive - decrease in L UE numbness/tingling    TREATMENT DATE: 07/12/24                                                                                                                                Subjective: Patient reports 5/10 NPS in the upper neck. She reported that initial HEP has helped improved some of the upper neck pain. No further questions or concerns.   Therapeutic Exercise:   UBE - 5 min - 2.5 min Fwd, 2.5 min Retro - Level 14-12 for UE  strength and muscular endurance; PT manually adjusted resistance throughout to patient's tolerance.    Cervical SNAG - R Rotation to improve ROM     1 x 10 - Good carryover with hand placement    Cervical Isometrics - Supine - Against PT resistance    Rotation 3 sets x 10s holds   Sidebending 3 sets x 10s holds   Retraction 3 sets x 10s hold  PT education on proper technique for cervical isometrics with a partner.   Neuromuscular Re-education (with intent to improve cervical posture):   Seated Scapular Retraction with ER    1 x 10 - Red TB    2 x 10 - Green TB    Standing Shoulder Row    1 x 10 - Green TB     Seated Scap Retractions    1 x 10 x 5s hold   First Rib Mobilization - with mob belt    1 x 10 - Left Side    PT education on proper form and technique for postural strengthening exercises against resistance.   Manual Therapy:  Light STM applied to R/L Upper trapezius musculature,  for pain modulation and decrease muscle tension. Myofascial release, trigger point techniques utilized. Pt endorsed improvements in pain following intervention.      PATIENT EDUCATION:  Education details: HEP, POC, goals  Person educated: Patient Education method: Explanation, Demonstration, and Handouts Education comprehension: verbalized understanding and returned demonstration  HOME EXERCISE PROGRAM: Access Code: [email or web address] URL: [email or web address] Date: 07/12/2024 Prepared by: Lonni Yvonnia Tango  Exercises - Seated Upper Trapezius Stretch  - 2-3 x daily - 5-7 x weekly - 3-5 reps - 30-60 seconds hold - Seated Levator  Scapulae Stretch  - 2-3 x daily - 5-7 x weekly - 3-5 reps - 30-60 second hold - Seated Shoulder Rolls  - 2-3 x daily - 5-7 x weekly - 1 sets - 10 reps - Supine Suboccipital Release with Tennis Balls  - 2-3 x daily - 5-7 x weekly - as needed --> 1-2 minutes  hold - Standing Shoulder Row with Anchored Resistance  - 1 x daily - 3-4 x weekly - 2-3 sets - 10-12 reps - Seated Scapular Retraction  - 1 x daily - 3-4 x weekly - 2-3 sets - 10-12 reps - Shoulder External Rotation and Scapular Retraction with Resistance  - 1 x daily - 3-4 x weekly - 2-3 sets - 10-12 reps  Access Code: [email or web address] URL: [email or web address] Date: 06/22/2024 Prepared by: Maryanne Finder  Exercises - Seated Upper Trapezius Stretch  - 2-3 x daily - 5-7 x weekly - 3-5 reps - 30-60 seconds hold - Seated Levator Scapulae Stretch  - 2-3 x daily - 5-7 x weekly - 3-5 reps - 30-60 second hold - Seated Shoulder Rolls  - 2-3 x daily - 5-7 x weekly - 1 sets - 10 reps - Supine Suboccipital Release with Tennis Balls  - 2-3 x daily - 5-7 x weekly - as needed --> 1-2 minutes  hold  ASSESSMENT:  CLINICAL IMPRESSION: Patient returns to OPPT for first f/u in management  of cervical rotation. She tolerated all PT interventions without exacerbation of pain in the upper neck. Good return demonstration of scapular retraction following multimodal cues at mid back. Additional education with SNAG exercise in order to improve cervical rotation. Without formal measurement, R cervical rotation with moderate improvements following PT session. Based on today's performance, pt will continue to benefit from skilled PT in order to facilitate return to PLOF and improve QoL.    OBJECTIVE IMPAIRMENTS: decreased balance, decreased endurance, decreased mobility, difficulty walking, decreased ROM, decreased strength, hypomobility, impaired flexibility, impaired sensation, postural dysfunction, and pain.   ACTIVITY LIMITATIONS: carrying, lifting,  squatting, stairs, transfers, bathing, and dressing  PARTICIPATION LIMITATIONS: cleaning, laundry, and community activity  PERSONAL FACTORS: Age, Behavior pattern, Past/current experiences, and Time since onset of injury/illness/exacerbation are also affecting patient's functional outcome.   REHAB  POTENTIAL: Good  CLINICAL DECISION MAKING: Stable/uncomplicated  EVALUATION COMPLEXITY: Moderate   GOALS: Goals reviewed with patient? Yes  SHORT TERM GOALS: Target date: 07/20/2024  Patient will be independent in HEP to improve strength/mobility for better functional independence with ADLs. Baseline: 06/22/24: HEP initiated   Goal status: INITIAL  LONG TERM GOALS: Target date: 08/17/2024  Patient will reduce Neck Disability Index score to <10% to demonstrate minimal disability with ADL's including improved sleeping tolerance, sitting tolerance, etc for better mobility at home and work. Baseline: 06/22/24: 33/50 = 66%  Goal status: INITIAL  2.  Patient will report a worst pain of 3/10 on NRPS in neck to improve tolerance with ADLs and reduced symptoms with activities.  Baseline: 06/22/24: 10/10 worst  Goal status: INITIAL  3.  Patient will improve B UE strength by 1/3 MMT grade to demonstrate improvement in tolerance to activity and strength to perform ADLs independently.  Baseline: 06/22/24: see above Goal status: INITIAL  4.  Patient will improve cervical ROM in all directions by 10 degrees to improve ability to look in all directions while driving safely.  Baseline: 06/22/24: see above  Goal status: INITIAL  5.  Patient will report decrease in frequency of migraines to < 2 per week to demonstrate improvement in muscular tightness in cervical region and ability tolerate daily activities with reduction in pain.  Baseline: 06/22/24: daily  Goal status: INITIAL   PLAN:  PT FREQUENCY: 1-2x/week  PT DURATION: 8 weeks  PLANNED INTERVENTIONS: 97164- PT Re-evaluation, 97750-  Physical Performance Testing, 97110-Therapeutic exercises, 97530- Therapeutic activity, 97112- Neuromuscular re-education, 97535- Self Care, [id number]- Manual therapy, [id number]- Electrical stimulation (unattended), [phone]- Electrical stimulation (manual), Patient/Family education, Joint mobilization, Joint manipulation, Spinal manipulation, Spinal mobilization, Cryotherapy, and Moist heat  PLAN FOR NEXT SESSION: HEP review, cervical distraction, manual therapy  Lonni Pall PT, DPT Physical Therapist- [facility]  [facility] 07/12/2024, 10:33 AM

## 2024-07-14 ENCOUNTER — Telehealth: Payer: Self-pay | Admitting: Nurse Practitioner

## 2024-07-14 ENCOUNTER — Encounter: Payer: Self-pay | Admitting: Nurse Practitioner

## 2024-07-14 ENCOUNTER — Ambulatory Visit: Attending: Nurse Practitioner | Admitting: Nurse Practitioner

## 2024-07-14 VITALS — BP 141/81 | HR 69 | Temp 97.1°F | Resp 18 | Ht 63.0 in | Wt 238.0 lb

## 2024-07-14 DIAGNOSIS — M792 Neuralgia and neuritis, unspecified: Secondary | ICD-10-CM | POA: Insufficient documentation

## 2024-07-14 DIAGNOSIS — G894 Chronic pain syndrome: Secondary | ICD-10-CM | POA: Insufficient documentation

## 2024-07-14 DIAGNOSIS — M5481 Occipital neuralgia: Secondary | ICD-10-CM | POA: Diagnosis not present

## 2024-07-14 DIAGNOSIS — M47816 Spondylosis without myelopathy or radiculopathy, lumbar region: Secondary | ICD-10-CM | POA: Insufficient documentation

## 2024-07-14 DIAGNOSIS — Z79899 Other long term (current) drug therapy: Secondary | ICD-10-CM | POA: Diagnosis not present

## 2024-07-14 DIAGNOSIS — M5412 Radiculopathy, cervical region: Secondary | ICD-10-CM | POA: Diagnosis not present

## 2024-07-14 DIAGNOSIS — M7918 Myalgia, other site: Secondary | ICD-10-CM | POA: Diagnosis not present

## 2024-07-14 DIAGNOSIS — G8929 Other chronic pain: Secondary | ICD-10-CM | POA: Diagnosis present

## 2024-07-14 MED ORDER — TRAMADOL HCL ER 100 MG PO TB24: 100.0000 mg | ORAL_TABLET | Freq: Every day | ORAL | 2 refills | Status: AC

## 2024-07-14 MED ORDER — PREGABALIN 100 MG PO CAPS: 100.0000 mg | ORAL_CAPSULE | Freq: Three times a day (TID) | ORAL | 2 refills | Status: AC

## 2024-07-14 NOTE — Telephone Encounter (Signed)
 PT stated pharmacy needs prior auth for her tramadol .

## 2024-07-14 NOTE — Telephone Encounter (Signed)
 PA SENT lp

## 2024-07-14 NOTE — Progress Notes (Signed)
 Nursing Pain Medication Assessment:  Safety precautions to be maintained throughout the outpatient stay will include: orient to surroundings, keep bed in low position, maintain call bell within reach at all times, provide assistance with transfer out of bed and ambulation.  Medication Inspection Compliance: Pill count conducted under aseptic conditions, in front of the patient. Neither the pills nor the bottle was removed from the patient's sight at any time. Once count was completed pills were immediately returned to the patient in their original bottle.  Medication: Tramadol  (Ultram ) Pill/Patch Count: 4 of 30 pills/patches remain Pill/Patch Appearance: Markings consistent with prescribed medication Bottle Appearance: Standard pharmacy container. Clearly labeled. Filled Date: 58 / 13 / 2025 Last Medication intake:  Yesterday

## 2024-07-14 NOTE — Progress Notes (Signed)
 PROVIDER NOTE: Interpretation of information contained herein should be left to medically-trained personnel. Specific patient instructions are provided elsewhere under Patient Instructions section of medical record. This document was created in part using AI and STT-dictation technology, any transcriptional errors that may result from this process are unintentional.  Patient: Anita Gibbs  Service: E/M   PCP: Anita App, NP  DOB: 1971-01-26  DOS: 07/14/2024  Provider: Emmy MARLA Blanch, NP  MRN: [id number]  Delivery: Face-to-face  Specialty: Interventional Pain Management  Type: Established Patient  Setting: Ambulatory outpatient facility  Specialty designation: 09  Referring Prov.: Anita App, NP  Location: Outpatient office facility       History of present illness (HPI) Anita Gibbs, a 53 y.o. year old female, is here today because of her Cervical radicular pain [M54.12]. Anita Gibbs primary complain today is Back Pain  Pertinent problems: Anita Gibbs has has Bilateral occipital neuralgia; Lumbar pain; Morbid obesity (BMI of 45.0-49.9, adult) (HCC); and Chronic pain syndrome on their pertinent problem list.   Pain Assessment: Severity of Chronic pain is reported as a 3 /10. Location: Back Lower/Denies. Onset: More than a month ago. Quality: Anita Gibbs. Timing: Constant. Modifying factor(s): Pain medication, Heating pad. Vitals:  height is 5' 3 (1.6 m) and weight is 238 lb (108 kg). Her temporal temperature is 97.1 F (36.2 C) (abnormal). Her blood pressure is 141/81 (abnormal) and her pulse is 69. Her respiration is 18 and oxygen saturation is 100%.  BMI: Estimated body mass index is 42.16 kg/m as calculated from the following:   Height as of this encounter: 5' 3 (1.6 m).   Weight as of this encounter: 238 lb (108 kg).  Last encounter: 06/13/2024. Last procedure: Visit date not found.  Reason for encounter: medication management.   Discussed the use of AI scribe software  for clinical note transcription with the patient, who gave verbal consent to proceed.  History of Present Illness   Anita Gibbs is a 53 year old female who presents for pain management of chronic neck and lower back pain. No change in medical history since last visit. Patient's pain is at baseline. Patient continues multimodal pain regimen as prescribed. States that it provides pain relief and improvement in functional status.   She experiences chronic pain primarily in her lower back and neck. The neck pain radiates across her shoulders and is associated with headaches. She is currently undergoing physical therapy for her neck pain.  She has a history of receiving an occipital nerve block for her neck pain, which did not provide relief. Additionally, she underwent radiofrequency ablation for her lumbar pain in June 2025, which provided only temporary relief.  She is currently taking tramadol  ER 24-hour tablets consistently, which she finds helpful for her pain. She also takes pregabalin  and methocarbamol  and has methocarbamol  available with sufficient refills. No side effects from her medications are reported.  No side effects from her medications, including Lyrica .     Pharmacotherapy Assessment   Tramadol  (Ultram -ER) 100 mg 24-hour tablet daily for pain. MME=20 Pregabalin  (Lyrica ) 100 mg capsule 3 times daily for neuropathic pain Monitoring: [name] PMP: PDMP reviewed during this encounter.       Pharmacotherapy: No side-effects or adverse reactions reported. Compliance: No problems identified. Effectiveness: Clinically acceptable.  Anita Gibbs, [place]  07/14/2024  8:13 AM  Sign when Signing Visit Nursing Pain Medication Assessment:  Safety precautions to be maintained throughout the outpatient stay will include: orient to surroundings, keep bed  in low position, maintain call bell within reach at all times, provide assistance with transfer out of bed and ambulation.  Medication Inspection  Compliance: Pill count conducted under aseptic conditions, in front of the patient. Neither the pills nor the bottle was removed from the patient's sight at any time. Once count was completed pills were immediately returned to the patient in their original bottle.  Medication: Tramadol  (Ultram ) Pill/Patch Count: 4 of 30 pills/patches remain Pill/Patch Appearance: Markings consistent with prescribed medication Bottle Appearance: Standard pharmacy container. Clearly labeled. Filled Date: 71 / 13 / 2025 Last Medication intake:  Yesterday    UDS:  Summary  Date Value Ref Range Status  02/01/2020 Note  Final    Comment:    ==================================================================== ToxASSU[employer] Select 13 (MW) ==================================================================== Test                             Result       Flag       Units Drug Present not Declared for Prescription Verification   Carboxy-THC                    20           UNEXPECTED ng/mg creat    Carboxy-THC is a metabolite of tetrahydrocannabinol (THC). Source of    THC is most commonly herbal marijuana or marijuana-based products,    but THC is also present in a scheduled prescription medication.    Trace amounts of THC can be present in hemp and cannabidiol (CBD)    products. This test is not intended to distinguish between delta-9-    tetrahydrocannabinol, the predominant form of THC in most herbal or    marijuana-based products, and delta-8-tetrahydrocannabinol. Drug Absent but Declared for Prescription Verification   Oxycodone                       Not Detected UNEXPECTED ng/mg creat ==================================================================== Test                      Result    Flag   Units      Ref Range   Creatinine              230              mg/dL      >=79 ==================================================================== Declared Medications:  The flagging and interpretation on this report  are based on the  following declared medications.  Unexpected results may arise from  inaccuracies in the declared medications.  **Note: The testing scope of this panel includes these medications:  Oxycodone  (Roxicodone )  **Note: The testing scope of this panel does not include the  following reported medications:  Albuterol  (Ventolin  HFA)  Amlodipine  (Norvasc )  Atorvastatin  (Lipitor)  Cetirizine   Diphenhydramine (Benadryl)  Epinephrine  (EpiPen )  Fluticasone  (Flonase )  Gabapentin  (Neurontin )  Hydrochlorothiazide   Losartan  (Cozaar )  Meloxicam  (Mobic )  Methocarbamol  (Robaxin )  Montelukast  (Singulair )  Norethindrone (Aygestin)  Pantoprazole  (Protonix )  Vitamin D3 ==================================================================== For clinical consultation, please call [phone]. ====================================================================     No results found for: CBDTHCR No results found for: D8THCCBX No results found for: D9THCCBX  ROS  Constitutional: Denies any fever or chills Gastrointestinal: No reported hemesis, hematochezia, vomiting, or acute GI distress Musculoskeletal: Low back pain, neck pain radiated to bilateral arms Neurological: No reported episodes of acute onset apraxia, aphasia, dysarthria, agnosia, amnesia, paralysis, loss of coordination, or loss  of consciousness  Medication Review  Cyanocobalamin , DULoxetine , EPINEPHrine , Melatonin, Vitamin D -3, albuterol , amLODipine , atorvastatin , busPIRone , butalbital -acetaminophen -caffeine , cetirizine , dicyclomine , etanercept, famotidine , fluconazole , fluticasone , hydrOXYzine , hydrochlorothiazide , hydrocortisone , losartan , methocarbamol , montelukast , ondansetron , pantoprazole , polyethylene glycol powder, potassium chloride  SA, predniSONE , pregabalin , saccharomyces boulardii, tirzepatide , traMADol , traZODone , and triamcinolone  cream  History Review  Allergy : Anita Gibbs is allergic to other,  pineapple, pineapple extract, shellfish allergy , strawberry (diagnostic), and tomato. Drug: Anita Gibbs  reports no history of drug use. Alcohol:  reports current alcohol use of about 2.0 standard drinks of alcohol per week. Tobacco:  reports that she has never smoked. She has never used smokeless tobacco. Social: Anita Gibbs  reports that she has never smoked. She has never used smokeless tobacco. She reports current alcohol use of about 2.0 standard drinks of alcohol per week. She reports that she does not use drugs. 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), Adrenal nodule (06/20/2020), Allergy , ANA positive (02/01/2021), Anxiety and depression (01/29/2021), Arthritis, Back pain with history of spinal surgery (01/29/2021), Bilateral carpal tunnel syndrome (01/29/2021), Bilateral leg edema (01/29/2021), BMI 40.0-44.9, adult (HCC) (04/30/2022), Carpal boss, right, Carpal tunnel syndrome, bilateral, 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) (09/27/2018), Cervical facet hypertrophy (C3-T1) (09/27/2018), Cervical facet joint syndrome (Bilateral) (L>R) (09/27/2018), 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-5, C5-6, and C6-7) (09/27/2018), Cervical myelopathy (HCC) (07/01/2017), Cervicalgia (Primary Area of Pain) (Bilateral) (L>R) (09/27/2018), Chronic gout of foot (Left) (05/31/2018), Chronic low back pain (Tertiary Area of Pain) (Bilateral) (R>L) w/ sciatica (Bilateral) (09/07/2018), Chronic low back pain (Tertiary Area of Pain) (Bilateral) (R>L) w/o sciatica (07/26/2018), Chronic lower extremity pain (Fourth Area of Pain) (Bilateral) (R>L) (09/07/2018), Chronic musculoskeletal pain (10/27/2018), Chronic neck pain (Bilateral) w/ history of cervical spinal surgery  (09/27/2018), Chronic neck pain (Primary Area of Pain) (Bilateral) (L>R) (09/07/2018), Chronic sacroiliac joint dysfunction (Bilateral) (09/27/2018), Chronic sacroiliac joint pain (Right) (09/07/2018), Chronic upper extremity pain (Secondary Area of Pain) (Bilateral) (R>L) (09/27/2018), Disorder of skeletal system (09/07/2018), E. coli UTI (urinary tract infection) (06/20/2020), Elevated sed rate (09/08/2018), Elevated serum creatinine (06/20/2020), Excessive daytime sleepiness (03/06/2020), Female pelvic pain (09/05/2020), GERD (gastroesophageal reflux disease), Headache, History of allergy  to shellfish (09/28/2018), History of fusion of cervical spine (ACDF C4-C7) (09/27/2018), History of illicit drug use (03/07/2020), Hives (09/05/2020), Hives (09/05/2020), Hyperkalemia (05/31/2018), Hyperlipidemia (04/30/2022), Hypertension, Large breasts (05/30/2020), Left hip pain (07/21/2022), Lumbar facet arthropathy (04/20/2019), Lumbar radiculitis (L5 dermatome) (Right) (04/17/2020), Multiple environmental allergies (01/17/2020), Multiple food allergies (03/07/2020), Neurogenic pain (10/27/2018), Numbness and tingling of both feet (05/31/2018), 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), Other intervertebral disc degeneration, 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), Pharmacologic therapy (09/07/2018), Plantar fasciitis, bilateral (03/04/2021), Polyneuropathy (01/29/2021), Prediabetes (10/10/2019), Problems influencing health status (09/07/2018), Rash (08/08/2019), Seasonal allergic rhinitis due to pollen (11/29/2018), Seasonal allergies (03/06/2020), Seasonal asthma (11/29/2018), Sjogren's disease, Sleep apnea, Somatic dysfunction of sacroiliac joint (Bilateral) (09/27/2018), Spondylosis without myelopathy or radiculopathy, cervical region (03/22/2019), Spondylosis without myelopathy or radiculopathy, lumbosacral region (05/19/2019), Spondylosis, cervical, w/ myelopathy (09/27/2018), URI with cough and congestion  (06/16/2024), and Vaginal discharge (06/11/2020). Surgical: Anita Gibbs  has a past surgical history that includes Knee arthroscopy (Right); Anterior cervical decomp/discectomy fusion (N/A, 07/01/2017); Abdominal hysterectomy (10/17/2020); Carpal tunnel release (Bilateral); and Cholecystectomy. Family: family history includes [name] in her mother; Arthritis in her father, mother, and sister; Breast cancer in her maternal aunt and paternal aunt; COPD in her brother and son; Cancer  in her brother; Depression in her son; Diabetes in her mother; Early death in her brother; Hearing loss in her father; Hyperlipidemia in her mother; Hypertension in her father, mother, and sister; Kidney disease in her father; Lymphoma in her father; Post-traumatic stress disorder in her brother and son; Sleep apnea in her daughter.  Laboratory Chemistry Profile   Renal Lab Results  Component Value Date   BUN 14 06/16/2024   CREATININE 0.95 06/16/2024   BCR NOT APPLICABLE 02/13/2021   GFR 69.65 06/16/2024   GFRAA 76 09/07/2018   GFRNONAA 56 (L) 05/31/2024    Hepatic Lab Results  Component Value Date   AST 13 06/16/2024   ALT 14 06/16/2024   ALBUMIN 4.3 06/16/2024   ALKPHOS 59 06/16/2024   LIPASE 37.0 06/08/2024    Electrolytes Lab Results  Component Value Date   NA 141 06/16/2024   K 3.9 06/16/2024   CL 105 06/16/2024   CALCIUM  9.2 06/16/2024   MG 2.2 09/07/2018    Bone Lab Results  Component Value Date   VD25OH 29.35 (L) 04/08/2023   25OHVITD1 8.1 (L) 09/07/2018   25OHVITD2 <1.0 09/07/2018   25OHVITD3 8.1 09/07/2018    Inflammation (CRP: Acute Phase) (ESR: Chronic Phase) Lab Results  Component Value Date   CRP <1.0 07/22/2023   ESRSEDRATE 57 (H) 07/22/2023         Note: Above Lab results reviewed.  Recent Imaging Review  US  PELVIC COMPLETE WITH TRANSVAGINAL EXAM: US  Pelvis, Complete Transvaginal and Transabdominal without Doppler  TECHNIQUE: Transabdominal and transvaginal pelvic  duplex ultrasound using B-mode/gray scaled imaging without Doppler spectral analysis and color flow was obtained.  COMPARISON: US  Pelvis 01/27/2018  CLINICAL HISTORY: Left adnexal cyst, hysterectomy.  FINDINGS:  UTERUS: Hysterectomy.  RIGHT OVARY: Neither the right ovary nor the left ovary were well visualized due to overlying bowel gas.  LEFT OVARY: Neither the right ovary nor the left ovary were well visualized due to overlying bowel gas.  FREE FLUID: No free fluid.  IMPRESSION: 1. Hysterectomy. 2. The ovaries not well visualized due to overlying bowel gas.  Electronically signed by: Rogelia Myers MD 07/12/2024 04:03 PM EST RP Workstation: [id number] Note: Reviewed        Physical Exam  Vitals: BP (!) 141/81 (BP Location: Left Arm, Patient Position: Sitting)   Pulse 69   Temp (!) 97.1 F (36.2 C) (Temporal)   Resp 18   Ht 5' 3 (1.6 m)   Wt 238 lb (108 kg)   LMP  (LMP Unknown)   SpO2 100%   BMI 42.16 kg/m  BMI: Estimated body mass index is 42.16 kg/m as calculated from the following:   Height as of this encounter: 5' 3 (1.6 m).   Weight as of this encounter: 238 lb (108 kg). Ideal: Ideal body weight: 52.4 kg (115 lb 8.3 oz) Adjusted ideal body weight: 74.6 kg (164 lb 8.2 oz) General appearance: Well nourished, well developed, and well hydrated. In no apparent acute distress Mental status: Alert, oriented x 3 (person, place, & time)       Respiratory: No evidence of acute respiratory distress Eyes: PERLA  Musculoskeletal: +LBP Neck pain radiated to bilateral arms Assessment   Diagnosis Status  1. Cervical radicular pain   2. Neurogenic pain   3. Chronic pain syndrome   4. Chronic musculoskeletal pain   5. Lumbar facet joint syndrome   6. Bilateral occipital neuralgia   7. Medication management    Controlled Controlled Controlled   Updated Problems:  No problems updated.  Plan of Care  Problem-specific:  Assessment and Plan    Chronic  pain syndrome with cervical radiculopathy, lumbar spondylosis, and occipital neuralgia Chronic pain in lower back and neck with radiation to shoulder and headaches. Limited relief from previous occipital nerve block and lumbar radiofrequency ablation. Tramadol  ER effective. - Continue tramadol  ER 24-hour tablet with two refills. - Continue with pregabalin  for neurogenic pain - Sent prescriptions to [facility], [place]. - Collected urine sample for monitoring.  - No adverse reaction or side effects reported to medication - Prescribing drug monitoring (PDMP) reviewed, findings consistent with the use of prescribed medication and no evidence of narcotic misuse or abuse.  Schedule follow-up in 90 days for medication management.      Anita Gibbs has a current medication list which includes the following long-term medication(s): albuterol , amlodipine , atorvastatin , cetirizine , dicyclomine , duloxetine , enbrel sureclick, famotidine , fluticasone , losartan , methocarbamol , montelukast , pantoprazole , potassium chloride  sa, trazodone , and pregabalin .  Pharmacotherapy (Medications Ordered): Meds ordered this encounter  Medications   traMADol  (ULTRAM -ER) 100 MG 24 hr tablet    Sig: Take 1 tablet (100 mg total) by mouth daily.    Dispense:  30 tablet    Refill:  2   pregabalin  (LYRICA ) 100 MG capsule    Sig: Take 1 capsule (100 mg total) by mouth 3 (three) times daily.    Dispense:  90 capsule    Refill:  2    Fill one day early if pharmacy is closed on scheduled refill date. May substitute for generic if available.   Orders:  Orders Placed This Encounter  Procedures   ToxASSURE Select 13 (MW), Urine    Volume: 30 ml(s). Minimum 3 ml of urine is needed. Document temperature of fresh sample. Indications: Long term (current) use of opiate analgesic (S20.108)    Release to patient:   Immediate        Return in about 3 months (around 10/14/2024) for (F2F), (MM), Anita Blanch NP.     Recent Visits Date Type Provider Dept  06/13/24 Office Visit Taevyn Hausen K, NP [facility]  05/05/24 Office Visit Cadon Raczka K, NP [facility]  Showing recent visits within past 90 days and meeting all other requirements Today's Visits Date Type Provider Dept  07/14/24 Office Visit Nagi Furio K, NP [facility]  Showing today's visits and meeting all other requirements Future Appointments Date Type Provider Dept  10/11/24 Appointment Kapil Petropoulos K, NP [facility]  Showing future appointments within next 90 days and meeting all other requirements  I discussed the assessment and treatment plan with the patient. The patient was provided an opportunity to ask questions and all were answered. The patient agreed with the plan and demonstrated an understanding of the instructions.  Patient advised to call back or seek an in-person evaluation if the symptoms or condition worsens.  I personally spent a total of 30 minutes in the care of the patient today including preparing to see the patient, getting/reviewing separately obtained history, performing a medically appropriate exam/evaluation, counseling and educating, placing orders, referring and communicating with other health care professionals, documenting clinical information in the EHR, independently interpreting results, communicating results, and coordinating care.   Note by: Truxton Stupka K Shandrika Ambers, NP (TTS and AI technology used. I apologize for any typographical errors that were not detected and corrected.) Date: 07/14/2024; Time: 8:35 AM

## 2024-07-15 ENCOUNTER — Encounter: Admitting: Clinical

## 2024-07-15 NOTE — Progress Notes (Signed)
   Anita Seats, LCSW

## 2024-07-15 NOTE — Progress Notes (Signed)
 This encounter was created in error - please disregard.

## 2024-07-18 ENCOUNTER — Ambulatory Visit

## 2024-07-18 DIAGNOSIS — M542 Cervicalgia: Secondary | ICD-10-CM

## 2024-07-18 DIAGNOSIS — M6281 Muscle weakness (generalized): Secondary | ICD-10-CM | POA: Diagnosis not present

## 2024-07-18 DIAGNOSIS — M5459 Other low back pain: Secondary | ICD-10-CM

## 2024-07-18 DIAGNOSIS — M5412 Radiculopathy, cervical region: Secondary | ICD-10-CM

## 2024-07-18 NOTE — Therapy (Signed)
 OUTPATIENT PHYSICAL THERAPY CERVICAL TREATMENT   Patient Name: Anita Gibbs MRN: [id number] DOB:09-Sep-1970, 53 y.o., female Today's Date: 07/18/2024  END OF SESSION:  PT End of Session - 07/18/24 0903     Visit Number 3    Number of Visits 17    Date for Recertification  08/17/24    Authorization Type [employer] auth#[id number] for 8 PT vsts from 10/28-12/17/25    Authorization Time Period 10/28-12/17/25    Authorization - Visit Number 2    Authorization - Number of Visits 8    PT Start Time 0902    PT Stop Time 0942    PT Time Calculation (min) 40 min    Activity Tolerance Patient tolerated treatment well    Behavior During Therapy [facility] for tasks assessed/performed           Past Medical History:  Diagnosis Date   Abdominal pain 06/11/2020   Abnormal drug screen 03/06/2020   Abnormal MRI, cervical spine (06/10/2018) 09/27/2018   FINDINGS:  Vertebrae: fusion hardware at C4, C5, C6, and C7.  Posterior Fossa, vertebral arteries, paraspinal tissues: A relatively empty sella present.     Disc levels:  C3-4: Negative. A mild broad-based disc osteophyte complex present. Uncovertebral spurring contributes to mild foraminal narrowing bilaterally.  C4-5: A leftward disc osteophyte complex is present. Uncovertebral and facet disease   Acute cystitis without hematuria 06/18/2020   Acute vaginitis 06/11/2020   Adenoma of left adrenal gland 08/01/2020   Adrenal nodule 06/20/2020   Allergy     ANA positive 02/01/2021   Anxiety and depression 01/29/2021   Arthritis    Back pain with history of spinal surgery 01/29/2021   Bilateral carpal tunnel syndrome 01/29/2021   Bilateral leg edema 01/29/2021   BMI 40.0-44.9, adult (HCC) 04/30/2022   Carpal boss, right    Carpal tunnel syndrome, bilateral    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) 09/27/2018   Levels:  C4-5: There is partial effacement of ventral CSF.     IMPRESSION:  Mild residual central canal narrowing at C4-5 s/p ACDF.    Cervical facet hypertrophy (C3-T1) 09/27/2018   Levels:  C3-4: Uncovertebral spurring  C4-5: Uncovertebral and facet disease  C5-6: Residual uncovertebral disease.  C6-7: Residual uncovertebral disease.  C7-T1: Minimal left-sided uncovertebral spurring   Cervical facet joint syndrome (Bilateral) (L>R) 09/27/2018   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-5, C5-6, and C6-7) 09/27/2018   Levels:  C3-4: Mild foraminal narrowing bilaterally.  C4-5: Mild left foraminal narrowing.  C5-6: Mild left foraminal narrowing is due to residual uncovertebral disease.  C6-7: Mild left foraminal narrowing is due to residual uncovertebral disease.   Cervical myelopathy (HCC) 07/01/2017   Cervicalgia (Primary Area of Pain) (Bilateral) (L>R) 09/27/2018   Chronic gout of foot (Left) 05/31/2018   Chronic low back pain [facility] Area of Pain) (Bilateral) (R>L) w/ sciatica (Bilateral) 09/07/2018   Chronic low back pain [facility] Area of Pain) (Bilateral) (R>L) w/o sciatica 07/26/2018   Chronic lower extremity pain (Fourth Area of Pain) (Bilateral) (R>L) 09/07/2018   Chronic musculoskeletal pain 10/27/2018   Chronic neck pain (Bilateral) w/ history of cervical spinal surgery 09/27/2018   Chronic neck pain (Primary Area of Pain) (Bilateral) (L>R) 09/07/2018   Chronic sacroiliac joint dysfunction (Bilateral) 09/27/2018   Chronic sacroiliac joint pain (Right) 09/07/2018   Chronic upper extremity pain (Secondary Area of Pain) (Bilateral) (R>L) 09/27/2018   Disorder of skeletal system 09/07/2018   E. coli UTI (urinary tract infection) 06/20/2020   Elevated sed rate  09/08/2018   Elevated serum creatinine 06/20/2020   Excessive daytime sleepiness 03/06/2020   Female pelvic pain 09/05/2020   GERD (gastroesophageal reflux disease)    Headache    History of allergy  to shellfish 09/28/2018   History of fusion of cervical spine (ACDF C4-C7) 09/27/2018   History of illicit drug use 03/07/2020   Hives 09/05/2020    Hives 09/05/2020   Hyperkalemia 05/31/2018   Mild   Hyperlipidemia 04/30/2022   Hypertension    Large breasts 05/30/2020   Left hip pain 07/21/2022   Lumbar facet arthropathy 04/20/2019   Lumbar radiculitis (L5 dermatome) (Right) 04/17/2020   Multiple environmental allergies 01/17/2020   Multiple food allergies 03/07/2020   Neurogenic pain 10/27/2018   Numbness and tingling of both feet 05/31/2018   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   Other intervertebral disc degeneration, lumbar region 11/15/2019   Pharmacologic therapy 09/07/2018   Plantar fasciitis, bilateral 03/04/2021   Polyneuropathy 01/29/2021   Prediabetes 10/10/2019   Problems influencing health status 09/07/2018   Rash 08/08/2019   Seasonal allergic rhinitis due to pollen 11/29/2018   Seasonal allergies 03/06/2020   Seasonal asthma 11/29/2018   Sjogren's disease    Sleep apnea    Somatic dysfunction of sacroiliac joint (Bilateral) 09/27/2018   Spondylosis without myelopathy or radiculopathy, cervical region 03/22/2019   Spondylosis without myelopathy or radiculopathy, lumbosacral region 05/19/2019   Spondylosis, cervical, w/ myelopathy 09/27/2018   URI with cough and congestion 06/16/2024   Vaginal discharge 06/11/2020   Past Surgical History:  Procedure Laterality Date   ABDOMINAL HYSTERECTOMY  10/17/2020   [facility]; she has both ovaries   ANTERIOR CERVICAL DECOMP/DISCECTOMY FUSION N/A 07/01/2017   Procedure: ANTERIOR CERVICAL DECOMPRESSION/DISCECTOMY FUSION 3 LEVELS;  Surgeon: Clois Fret, MD;  Location: [facility] ORS;  Service: Neurosurgery;  Laterality: N/A;   CARPAL TUNNEL RELEASE Bilateral    CHOLECYSTECTOMY     KNEE ARTHROSCOPY Right    Patient Active Problem List   Diagnosis Date Noted   URI with cough and congestion 06/16/2024   Hypokalemia 06/09/2024   Nausea 06/08/2024   Abdominal bloating 05/05/2024   Cervical radicular pain 03/22/2024   Bilateral occipital neuralgia  03/22/2024   Sjogren syndrome with inflammatory arthritis 03/21/2024   MDD (major depressive disorder), recurrent episode, severe (HCC) 03/16/2024   Insomnia 02/25/2024   MDD (major depressive disorder), recurrent episode, moderate (HCC) 01/18/2024   OSA (obstructive sleep apnea) 01/18/2024   Seronegative spondyloarthropathy 01/05/2024   Episodic tension-type headache, not intractable 01/05/2024   Palpitations 11/24/2023   Wasp sting 11/24/2023   AKI (acute kidney injury) 11/02/2023   Other chest pain 10/29/2023   GAD (generalized anxiety disorder) 09/30/2023   Lower abdominal pain 07/22/2023   Abnormal glucose 04/24/2023   Mood disorder 04/24/2023   Annual physical exam 04/24/2023   Anemia 04/24/2023   Chronic frontal sinusitis 10/08/2022   Abdominal pain, left lower quadrant 06/30/2022   Morbid obesity with BMI of 40.0-44.9, adult (HCC) 04/30/2022   Allergic rhinitis 03/04/2021   Bilateral leg edema 01/29/2021   Lumbosacral radiculopathy at L5 01/29/2021   Neuropathy 01/29/2021   Vaginal discharge 06/11/2020   Bacterial vaginosis 06/11/2020   Pain medication agreement broken 03/07/2020   Prediabetes 10/10/2019   HLD (hyperlipidemia) 10/10/2019   Gastroesophageal reflux disease 09/06/2019   Fibroid uterus 09/06/2019   Seasonal asthma 11/29/2018   DDD (degenerative disc disease), cervical 09/27/2018   Cervical spondylosis 09/27/2018   DDD (degenerative disc disease), lumbar 09/27/2018   Vitamin D  deficiency 09/27/2018  Chronic pain syndrome 09/07/2018   Essential hypertension 05/31/2018    PCP: Gretel App, NP  REFERRING PROVIDER: Hilma Hastings, PA-C  REFERRING DIAG:  Z98.1 (ICD-10-CM) - S/P cervical spinal fusion M47.812 (ICD-10-CM) - Cervical spondylosis M54.12 (ICD-10-CM) - Cervical radiculopathy R20.0,R20.2 (ICD-10-CM) - Numbness and tingling in left hand  THERAPY DIAG:  Muscle weakness (generalized)  Cervicalgia  Radiculopathy, cervical region  Other low  back pain  Rationale for Evaluation and Treatment: Rehabilitation  ONSET DATE: Chronic   SUBJECTIVE:                                                                                                                                                                                                         SUBJECTIVE STATEMENT: Patient reports she is having more pain recently and getting migraines. She is having trouble with dropping items in her hands. No issues with grip but maintaining the grip is difficult. The pain is more on the L than R. Patient reports if she turns her head too many times, she may get lightheaded.    Hand dominance: Left  PERTINENT HISTORY:  She is s/p C4-C7 ACDF on 07/01/17 by Dr. Clois. She continues with constant neck pain radiating down left arm to the hand causing numbness and tingling. She has intermittent pain in right harm. She has weakness in both hands.  PMH of HTN, asthma, GERD, chronic pain syndrome, hyperlipidemia, obesity.  PAIN:  Are you having pain? Yes: NPRS scale: 7/10; worst 10/10 Pain location: neck Pain description: aching, dull, throbbing Aggravating factors: housework, any activity  Relieving factors: heat, pain medication  PRECAUTIONS: None  RED FLAGS: Cervical red flags: Diplopia Yes:       WEIGHT BEARING RESTRICTIONS: No  FALLS:  Has patient fallen in last 6 months? No  LIVING ENVIRONMENT: Lives with: lives alone Lives in: House/apartment Has following equipment at home: Single point cane  OCCUPATION: not working, trying to get disability   PLOF: Independent - occasional assistance with ADLs when in severe pain  PATIENT GOALS: to see if I can get some type of relief    OBJECTIVE:  Note: Objective measures were completed at Evaluation unless otherwise noted.  DIAGNOSTIC FINDINGS:  N/A   PATIENT SURVEYS:  NDI:  NECK DISABILITY INDEX  Date: 06/22/2024 Score  Pain intensity 3 = The pain is fairly severe at the moment   2. Personal care (washing, dressing, etc.) 3 =  I need some help but can manage most of my personal care  3. Lifting 4 =  I can only lift very light  weights  4. Reading 3 = I can't read as much as I want because of moderate pain in my neck  5. Headaches 4 = I have severe headaches, which come frequently   6. Concentration 2 = I have a fair degree of difficulty in concentrating when I want to  7. Work 4 = I can hardly do any work at all  8. Driving 2 =  I can drive my car as long as I want with moderate pain in my neck  9. Sleeping 5 =  My sleep is completely disturbed (5-7 hrs sleepless)   10. Recreation 3 = I am able to engage in a few of my usual recreation activities because of pain in   my neck  Total 33/50 = 66%   Minimum Detectable Change (90% confidence): 5 points or 10% points  COGNITION: Overall cognitive status: Within functional limits for tasks assessed  SENSATION: Intermittent numbness/tingling in B hands (L>R)  POSTURE: rounded shoulders and forward head  PALPATION: Tightness noted to B UT, cervical paraspinals, suboccipitals  Tenderness noted but patient reports this is likely due to autoimmune disorder.   CERVICAL ROM:   Active ROM A/PROM (deg) eval  Flexion 25  Extension 28  Right lateral flexion 25  Left lateral flexion 15  Right rotation 10  Left rotation 20   (Blank rows = not tested)  UPPER EXTREMITY MMT:  MMT Right eval Left eval  Shoulder flexion 4 4  Shoulder extension    Shoulder abduction 4 4  Shoulder adduction    Shoulder extension    Shoulder internal rotation    Shoulder external rotation    Elbow flexion 4 4  Elbow extension 4 4  Wrist flexion    Wrist extension    Wrist ulnar deviation    Wrist radial deviation    Wrist pronation    Wrist supination     (Blank rows = not tested)  UPPER EXTREMITY ROM:  AROM WFL   CERVICAL SPECIAL TESTS:  Distraction test: Positive - decrease in L UE numbness/tingling    TREATMENT  DATE: 07/18/24                                                                                                                               Subjective: Patient reports 7/10 NPS in the upper neck. She reports that last PT session improved upper neck pain. No further questions or concerns.   Therapeutic Exercise:   UBE - 5 min - 2.5 min Fwd, 2.5 min Retro - Level 10-8 for UE  strength and muscular endurance; PT manually adjusted resistance throughout to patient's tolerance.    Cervical Isometrics - Supine - Against PT resistance    Rotation 3 sets x 10s holds   Retraction 3 sets x 10s hold    Sidebending 3 sets x 10s holds   Seated Shoulder Row    3 x 10 - 10#    -  multimodal cues for proper scapular retraction  Neuromuscular Re-education (with intent to improve cervical posture):  Standing Low Row with Blue TB    3 x 10    Standing TB Pull Apart  1 x 10  - Red TB 2 x 10 - Green    Prone Horizontal Row    R/L: 1 x 10 - AROM, 2 x 10 - 2#   PATIENT EDUCATION:  Education details: HEP, POC, goals  Person educated: Patient Education method: Explanation, Demonstration, and Handouts Education comprehension: verbalized understanding and returned demonstration  HOME EXERCISE PROGRAM: Access Code: [email or web address] URL: [email or web address] Date: 07/12/2024 Prepared by: Lonni Laylynn Campanella  Exercises - Seated Upper Trapezius Stretch  - 2-3 x daily - 5-7 x weekly - 3-5 reps - 30-60 seconds hold - Seated Levator Scapulae Stretch  - 2-3 x daily - 5-7 x weekly - 3-5 reps - 30-60 second hold - Seated Shoulder Rolls  - 2-3 x daily - 5-7 x weekly - 1 sets - 10 reps - Supine Suboccipital Release with Tennis Balls  - 2-3 x daily - 5-7 x weekly - as needed --> 1-2 minutes  hold - Standing Shoulder Row with Anchored Resistance  - 1 x daily - 3-4 x weekly - 2-3 sets - 10-12 reps - Seated Scapular Retraction  - 1 x daily - 3-4 x weekly - 2-3 sets - 10-12 reps - Shoulder External Rotation and  Scapular Retraction with Resistance  - 1 x daily - 3-4 x weekly - 2-3 sets - 10-12 reps  Access Code: [email or web address] URL: [email or web address] Date: 06/22/2024 Prepared by: Maryanne Finder  Exercises - Seated Upper Trapezius Stretch  - 2-3 x daily - 5-7 x weekly - 3-5 reps - 30-60 seconds hold - Seated Levator Scapulae Stretch  - 2-3 x daily - 5-7 x weekly - 3-5 reps - 30-60 second hold - Seated Shoulder Rolls  - 2-3 x daily - 5-7 x weekly - 1 sets - 10 reps - Supine Suboccipital Release with Tennis Balls  - 2-3 x daily - 5-7 x weekly - as needed --> 1-2 minutes  hold  ASSESSMENT:  CLINICAL IMPRESSION: Patient returns to OPPT in management of cervical pain. Good tolerance to progression of previous PT interventions. Session with main focus on strengthening postural muscles in order to reduce upper neck pain. Tactile cues throughout session in order to improve scapular retraction. Initial scapular retraction presenting with shoulder shrug compensation; form improved with additional repetitions and tactile feedback to the rhomboid. R cervical rotation with similar degrees of motion in comparison to L cervical rotation. Based on today's performance, pt will continue to benefit from skilled PT in order to facilitate return to PLOF and improve QoL.    OBJECTIVE IMPAIRMENTS: decreased balance, decreased endurance, decreased mobility, difficulty walking, decreased ROM, decreased strength, hypomobility, impaired flexibility, impaired sensation, postural dysfunction, and pain.   ACTIVITY LIMITATIONS: carrying, lifting, squatting, stairs, transfers, bathing, and dressing  PARTICIPATION LIMITATIONS: cleaning, laundry, and community activity  PERSONAL FACTORS: Age, Behavior pattern, Past/current experiences, and Time since onset of injury/illness/exacerbation are also affecting patient's functional outcome.   REHAB POTENTIAL: Good  CLINICAL DECISION MAKING: Stable/uncomplicated  EVALUATION  COMPLEXITY: Moderate   GOALS: Goals reviewed with patient? Yes  SHORT TERM GOALS: Target date: 07/20/2024  Patient will be independent in HEP to improve strength/mobility for better functional independence with ADLs. Baseline: 06/22/24: HEP initiated   Goal status: INITIAL  LONG TERM GOALS: Target date: 08/17/2024  Patient will reduce Neck Disability Index score  to <10% to demonstrate minimal disability with ADL's including improved sleeping tolerance, sitting tolerance, etc for better mobility at home and work. Baseline: 06/22/24: 33/50 = 66%  Goal status: INITIAL  2.  Patient will report a worst pain of 3/10 on NRPS in neck to improve tolerance with ADLs and reduced symptoms with activities.  Baseline: 06/22/24: 10/10 worst  Goal status: INITIAL  3.  Patient will improve B UE strength by 1/3 MMT grade to demonstrate improvement in tolerance to activity and strength to perform ADLs independently.  Baseline: 06/22/24: see above Goal status: INITIAL  4.  Patient will improve cervical ROM in all directions by 10 degrees to improve ability to look in all directions while driving safely.  Baseline: 06/22/24: see above  Goal status: INITIAL  5.  Patient will report decrease in frequency of migraines to < 2 per week to demonstrate improvement in muscular tightness in cervical region and ability tolerate daily activities with reduction in pain.  Baseline: 06/22/24: daily  Goal status: INITIAL   PLAN:  PT FREQUENCY: 1-2x/week  PT DURATION: 8 weeks  PLANNED INTERVENTIONS: 97164- PT Re-evaluation, 97750- Physical Performance Testing, 97110-Therapeutic exercises, 97530- Therapeutic activity, 97112- Neuromuscular re-education, 97535- Self Care, [id number]- Manual therapy, [id number]- Electrical stimulation (unattended), [phone]- Electrical stimulation (manual), Patient/Family education, Joint mobilization, Joint manipulation, Spinal manipulation, Spinal mobilization, Cryotherapy, and Moist  heat  PLAN FOR NEXT SESSION: HEP review, cervical distraction, manual therapy  Lonni Pall PT, DPT Physical Therapist- [facility]  [facility] 07/18/2024, 9:07 AM

## 2024-07-19 LAB — TOXASSURE SELECT 13 (MW), URINE

## 2024-07-20 ENCOUNTER — Encounter: Payer: Self-pay | Admitting: Nurse Practitioner

## 2024-07-20 ENCOUNTER — Ambulatory Visit

## 2024-07-20 DIAGNOSIS — M542 Cervicalgia: Secondary | ICD-10-CM

## 2024-07-20 DIAGNOSIS — M6281 Muscle weakness (generalized): Secondary | ICD-10-CM

## 2024-07-20 DIAGNOSIS — M5412 Radiculopathy, cervical region: Secondary | ICD-10-CM

## 2024-07-20 NOTE — Therapy (Signed)
 OUTPATIENT PHYSICAL THERAPY CERVICAL TREATMENT   Patient Name: Anita Gibbs MRN: [id number] DOB:11-03-1970, 53 y.o., female Today's Date: 07/20/2024  END OF SESSION:  PT End of Session - 07/20/24 1032     Visit Number 4    Number of Visits 17    Date for Recertification  08/17/24    Authorization Type [employer] auth#[id number] for 8 PT vsts from 10/28-12/17/25    Authorization Time Period 10/28-12/17/25    Authorization - Visit Number 3    Authorization - Number of Visits 8    PT Start Time 1030    PT Stop Time 1110    PT Time Calculation (min) 40 min    Activity Tolerance Patient tolerated treatment well    Behavior During Therapy [facility] for tasks assessed/performed           Past Medical History:  Diagnosis Date   Abdominal pain 06/11/2020   Abnormal drug screen 03/06/2020   Abnormal MRI, cervical spine (06/10/2018) 09/27/2018   FINDINGS:  Vertebrae: fusion hardware at C4, C5, C6, and C7.  Posterior Fossa, vertebral arteries, paraspinal tissues: A relatively empty sella present.     Disc levels:  C3-4: Negative. A mild broad-based disc osteophyte complex present. Uncovertebral spurring contributes to mild foraminal narrowing bilaterally.  C4-5: A leftward disc osteophyte complex is present. Uncovertebral and facet disease   Acute cystitis without hematuria 06/18/2020   Acute vaginitis 06/11/2020   Adenoma of left adrenal gland 08/01/2020   Adrenal nodule 06/20/2020   Allergy     ANA positive 02/01/2021   Anxiety and depression 01/29/2021   Arthritis    Back pain with history of spinal surgery 01/29/2021   Bilateral carpal tunnel syndrome 01/29/2021   Bilateral leg edema 01/29/2021   BMI 40.0-44.9, adult (HCC) 04/30/2022   Carpal boss, right    Carpal tunnel syndrome, bilateral    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) 09/27/2018   Levels:  C4-5: There is partial effacement of ventral CSF.     IMPRESSION:  Mild residual central canal narrowing at C4-5 s/p ACDF.    Cervical facet hypertrophy (C3-T1) 09/27/2018   Levels:  C3-4: Uncovertebral spurring  C4-5: Uncovertebral and facet disease  C5-6: Residual uncovertebral disease.  C6-7: Residual uncovertebral disease.  C7-T1: Minimal left-sided uncovertebral spurring   Cervical facet joint syndrome (Bilateral) (L>R) 09/27/2018   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-5, C5-6, and C6-7) 09/27/2018   Levels:  C3-4: Mild foraminal narrowing bilaterally.  C4-5: Mild left foraminal narrowing.  C5-6: Mild left foraminal narrowing is due to residual uncovertebral disease.  C6-7: Mild left foraminal narrowing is due to residual uncovertebral disease.   Cervical myelopathy (HCC) 07/01/2017   Cervicalgia (Primary Area of Pain) (Bilateral) (L>R) 09/27/2018   Chronic gout of foot (Left) 05/31/2018   Chronic low back pain [facility] Area of Pain) (Bilateral) (R>L) w/ sciatica (Bilateral) 09/07/2018   Chronic low back pain [facility] Area of Pain) (Bilateral) (R>L) w/o sciatica 07/26/2018   Chronic lower extremity pain (Fourth Area of Pain) (Bilateral) (R>L) 09/07/2018   Chronic musculoskeletal pain 10/27/2018   Chronic neck pain (Bilateral) w/ history of cervical spinal surgery 09/27/2018   Chronic neck pain (Primary Area of Pain) (Bilateral) (L>R) 09/07/2018   Chronic sacroiliac joint dysfunction (Bilateral) 09/27/2018   Chronic sacroiliac joint pain (Right) 09/07/2018   Chronic upper extremity pain (Secondary Area of Pain) (Bilateral) (R>L) 09/27/2018   Disorder of skeletal system 09/07/2018   E. coli UTI (urinary tract infection) 06/20/2020   Elevated sed rate  09/08/2018   Elevated serum creatinine 06/20/2020   Excessive daytime sleepiness 03/06/2020   Female pelvic pain 09/05/2020   GERD (gastroesophageal reflux disease)    Headache    History of allergy  to shellfish 09/28/2018   History of fusion of cervical spine (ACDF C4-C7) 09/27/2018   History of illicit drug use 03/07/2020   Hives 09/05/2020    Hives 09/05/2020   Hyperkalemia 05/31/2018   Mild   Hyperlipidemia 04/30/2022   Hypertension    Large breasts 05/30/2020   Left hip pain 07/21/2022   Lumbar facet arthropathy 04/20/2019   Lumbar radiculitis (L5 dermatome) (Right) 04/17/2020   Multiple environmental allergies 01/17/2020   Multiple food allergies 03/07/2020   Neurogenic pain 10/27/2018   Numbness and tingling of both feet 05/31/2018   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   Other intervertebral disc degeneration, lumbar region 11/15/2019   Pharmacologic therapy 09/07/2018   Plantar fasciitis, bilateral 03/04/2021   Polyneuropathy 01/29/2021   Prediabetes 10/10/2019   Problems influencing health status 09/07/2018   Rash 08/08/2019   Seasonal allergic rhinitis due to pollen 11/29/2018   Seasonal allergies 03/06/2020   Seasonal asthma 11/29/2018   Sjogren's disease    Sleep apnea    Somatic dysfunction of sacroiliac joint (Bilateral) 09/27/2018   Spondylosis without myelopathy or radiculopathy, cervical region 03/22/2019   Spondylosis without myelopathy or radiculopathy, lumbosacral region 05/19/2019   Spondylosis, cervical, w/ myelopathy 09/27/2018   URI with cough and congestion 06/16/2024   Vaginal discharge 06/11/2020   Past Surgical History:  Procedure Laterality Date   ABDOMINAL HYSTERECTOMY  10/17/2020   [facility]; she has both ovaries   ANTERIOR CERVICAL DECOMP/DISCECTOMY FUSION N/A 07/01/2017   Procedure: ANTERIOR CERVICAL DECOMPRESSION/DISCECTOMY FUSION 3 LEVELS;  Surgeon: Clois Fret, MD;  Location: [facility] ORS;  Service: Neurosurgery;  Laterality: N/A;   CARPAL TUNNEL RELEASE Bilateral    CHOLECYSTECTOMY     KNEE ARTHROSCOPY Right    Patient Active Problem List   Diagnosis Date Noted   URI with cough and congestion 06/16/2024   Hypokalemia 06/09/2024   Nausea 06/08/2024   Abdominal bloating 05/05/2024   Cervical radicular pain 03/22/2024   Bilateral occipital neuralgia  03/22/2024   Sjogren syndrome with inflammatory arthritis 03/21/2024   MDD (major depressive disorder), recurrent episode, severe (HCC) 03/16/2024   Insomnia 02/25/2024   MDD (major depressive disorder), recurrent episode, moderate (HCC) 01/18/2024   OSA (obstructive sleep apnea) 01/18/2024   Seronegative spondyloarthropathy 01/05/2024   Episodic tension-type headache, not intractable 01/05/2024   Palpitations 11/24/2023   Wasp sting 11/24/2023   AKI (acute kidney injury) 11/02/2023   Other chest pain 10/29/2023   GAD (generalized anxiety disorder) 09/30/2023   Lower abdominal pain 07/22/2023   Abnormal glucose 04/24/2023   Mood disorder 04/24/2023   Annual physical exam 04/24/2023   Anemia 04/24/2023   Chronic frontal sinusitis 10/08/2022   Abdominal pain, left lower quadrant 06/30/2022   Morbid obesity with BMI of 40.0-44.9, adult (HCC) 04/30/2022   Allergic rhinitis 03/04/2021   Bilateral leg edema 01/29/2021   Lumbosacral radiculopathy at L5 01/29/2021   Neuropathy 01/29/2021   Vaginal discharge 06/11/2020   Bacterial vaginosis 06/11/2020   Pain medication agreement broken 03/07/2020   Prediabetes 10/10/2019   HLD (hyperlipidemia) 10/10/2019   Gastroesophageal reflux disease 09/06/2019   Fibroid uterus 09/06/2019   Seasonal asthma 11/29/2018   DDD (degenerative disc disease), cervical 09/27/2018   Cervical spondylosis 09/27/2018   DDD (degenerative disc disease), lumbar 09/27/2018   Vitamin D  deficiency 09/27/2018  Chronic pain syndrome 09/07/2018   Essential hypertension 05/31/2018    PCP: Gretel App, NP  REFERRING PROVIDER: Hilma Hastings, PA-C  REFERRING DIAG:  Z98.1 (ICD-10-CM) - S/P cervical spinal fusion M47.812 (ICD-10-CM) - Cervical spondylosis M54.12 (ICD-10-CM) - Cervical radiculopathy R20.0,R20.2 (ICD-10-CM) - Numbness and tingling in left hand  THERAPY DIAG:  Muscle weakness (generalized)  Cervicalgia  Radiculopathy, cervical region  Rationale  for Evaluation and Treatment: Rehabilitation  ONSET DATE: Chronic   SUBJECTIVE:                                                                                                                                                                                                         SUBJECTIVE STATEMENT: Patient reports she is having more pain recently and getting migraines. She is having trouble with dropping items in her hands. No issues with grip but maintaining the grip is difficult. The pain is more on the L than R. Patient reports if she turns her head too many times, she may get lightheaded.    Hand dominance: Left  PERTINENT HISTORY:  She is s/p C4-C7 ACDF on 07/01/17 by Dr. Clois. She continues with constant neck pain radiating down left arm to the hand causing numbness and tingling. She has intermittent pain in right harm. She has weakness in both hands.  PMH of HTN, asthma, GERD, chronic pain syndrome, hyperlipidemia, obesity.  PAIN:  Are you having pain? Yes: NPRS scale: 7/10; worst 10/10 Pain location: neck Pain description: aching, dull, throbbing Aggravating factors: housework, any activity  Relieving factors: heat, pain medication  PRECAUTIONS: None  RED FLAGS: Cervical red flags: Diplopia Yes:       WEIGHT BEARING RESTRICTIONS: No  FALLS:  Has patient fallen in last 6 months? No  LIVING ENVIRONMENT: Lives with: lives alone Lives in: House/apartment Has following equipment at home: Single point cane  OCCUPATION: not working, trying to get disability   PLOF: Independent - occasional assistance with ADLs when in severe pain  PATIENT GOALS: to see if I can get some type of relief    OBJECTIVE:  Note: Objective measures were completed at Evaluation unless otherwise noted.  DIAGNOSTIC FINDINGS:  N/A   PATIENT SURVEYS:  NDI:  NECK DISABILITY INDEX  Date: 06/22/2024 Score  Pain intensity 3 = The pain is fairly severe at the moment  2. Personal care  (washing, dressing, etc.) 3 =  I need some help but can manage most of my personal care  3. Lifting 4 =  I can only lift very light weights  4. Reading 3 =  I can't read as much as I want because of moderate pain in my neck  5. Headaches 4 = I have severe headaches, which come frequently   6. Concentration 2 = I have a fair degree of difficulty in concentrating when I want to  7. Work 4 = I can hardly do any work at all  8. Driving 2 =  I can drive my car as long as I want with moderate pain in my neck  9. Sleeping 5 =  My sleep is completely disturbed (5-7 hrs sleepless)   10. Recreation 3 = I am able to engage in a few of my usual recreation activities because of pain in   my neck  Total 33/50 = 66%   Minimum Detectable Change (90% confidence): 5 points or 10% points  COGNITION: Overall cognitive status: Within functional limits for tasks assessed  SENSATION: Intermittent numbness/tingling in B hands (L>R)  POSTURE: rounded shoulders and forward head  PALPATION: Tightness noted to B UT, cervical paraspinals, suboccipitals  Tenderness noted but patient reports this is likely due to autoimmune disorder.   CERVICAL ROM:   Active ROM A/PROM (deg) eval  Flexion 25  Extension 28  Right lateral flexion 25  Left lateral flexion 15  Right rotation 10  Left rotation 20   (Blank rows = not tested)  UPPER EXTREMITY MMT:  MMT Right eval Left eval  Shoulder flexion 4 4  Shoulder extension    Shoulder abduction 4 4  Shoulder adduction    Shoulder extension    Shoulder internal rotation    Shoulder external rotation    Elbow flexion 4 4  Elbow extension 4 4  Wrist flexion    Wrist extension    Wrist ulnar deviation    Wrist radial deviation    Wrist pronation    Wrist supination     (Blank rows = not tested)  UPPER EXTREMITY ROM:  AROM WFL   CERVICAL SPECIAL TESTS:  Distraction test: Positive - decrease in L UE numbness/tingling    TREATMENT DATE: 07/20/24                                                                                                                                Subjective: Patient reports 4/10 NPS in the upper neck. She reports that last PT session improved upper neck pain. No further questions or concerns.   Manual Therapy:  Moderate STM applied to right and left upper trapezius, levator scapulae and cervical paraspinals musculature for pain modulation and decrease muscle tension. Myofascial release, trigger point. Pt endorsed improvements in pain following intervention.   Cervical Distraction  30s/bout x 3 bouts x grade II  in order to improve ROM and reduce cervical compression.   Therapeutic Exercise:   UBE - 5 min - 2.5 min Fwd, 2.5 min Retro - Level 10-8 for UE  strength; PT manually adjusted resistance throughout to patient's tolerance.  Cervical Chin Tucks - Sitting    2 x 15   Seated Shoulder Row    1 x 10 - 15#     2 x 10 - 20 #   Seated SNAG Cervical Rotation    R/L:  1 x 10 x 5s hold ea direction   Neuromuscular Re-education (with intent to improve cervical posture):  Standing Low Row with Blue TB    3 x 10    Standing TB Pull Apart  3 x 10 - Green TB   Standing Scapular Retraction with External Rotation  2 x 10 - Green TB  PATIENT EDUCATION:  Education details: HEP, POC, goals  Person educated: Patient Education method: Explanation, Demonstration, and Handouts Education comprehension: verbalized understanding and returned demonstration  HOME EXERCISE PROGRAM: Access Code: [email or web address] URL: [email or web address] Date: 07/12/2024 Prepared by: Lonni Cornelio Parkerson  Exercises - Seated Upper Trapezius Stretch  - 2-3 x daily - 5-7 x weekly - 3-5 reps - 30-60 seconds hold - Seated Levator Scapulae Stretch  - 2-3 x daily - 5-7 x weekly - 3-5 reps - 30-60 second hold - Seated Shoulder Rolls  - 2-3 x daily - 5-7 x weekly - 1 sets - 10 reps - Supine Suboccipital Release with Tennis Balls  - 2-3 x  daily - 5-7 x weekly - as needed --> 1-2 minutes  hold - Standing Shoulder Row with Anchored Resistance  - 1 x daily - 3-4 x weekly - 2-3 sets - 10-12 reps - Seated Scapular Retraction  - 1 x daily - 3-4 x weekly - 2-3 sets - 10-12 reps - Shoulder External Rotation and Scapular Retraction with Resistance  - 1 x daily - 3-4 x weekly - 2-3 sets - 10-12 reps  Access Code: [email or web address] URL: [email or web address] Date: 06/22/2024 Prepared by: Maryanne Finder  Exercises - Seated Upper Trapezius Stretch  - 2-3 x daily - 5-7 x weekly - 3-5 reps - 30-60 seconds hold - Seated Levator Scapulae Stretch  - 2-3 x daily - 5-7 x weekly - 3-5 reps - 30-60 second hold - Seated Shoulder Rolls  - 2-3 x daily - 5-7 x weekly - 1 sets - 10 reps - Supine Suboccipital Release with Tennis Balls  - 2-3 x daily - 5-7 x weekly - as needed --> 1-2 minutes  hold  ASSESSMENT:  CLINICAL IMPRESSION: Patient returns to OPPT in management of cervical pain. Good tolerance to progression of previous PT interventions. Session with main focus on strengthening postural muscles in order to reduce upper neck pain. Manual techniques utilized to reduce increased muscle tone in upper neck muscles. Less shoulder shrug compensatio noted with scapular exercises. Great demonstration of improved cervical rotation; she was able to achieve 55 deg bilaterally. Patient still experiences bilateral radiating pain in both shoulders into her fingers but improvements with frequency of HA. Her pain still limits her from full participation with recreational activities. Patient reported minimal improvement (3/10 NPS) in upper neck following PT interventions. Based on today's performance, pt will continue to benefit from skilled PT in order to facilitate return to PLOF and improve QoL.     OBJECTIVE IMPAIRMENTS: decreased balance, decreased endurance, decreased mobility, difficulty walking, decreased ROM, decreased strength, hypomobility, impaired  flexibility, impaired sensation, postural dysfunction, and pain.   ACTIVITY LIMITATIONS: carrying, lifting, squatting, stairs, transfers, bathing, and dressing  PARTICIPATION LIMITATIONS: cleaning, laundry, and community activity  PERSONAL FACTORS: Age, Behavior pattern, Past/current experiences, and Time since onset of injury/illness/exacerbation are also affecting patient's functional outcome.  REHAB POTENTIAL: Good  CLINICAL DECISION MAKING: Stable/uncomplicated  EVALUATION COMPLEXITY: Moderate   GOALS: Goals reviewed with patient? Yes  SHORT TERM GOALS: Target date: 07/20/2024  Patient will be independent in HEP to improve strength/mobility for better functional independence with ADLs. Baseline: 06/22/24: HEP initiated   Goal status: INITIAL  LONG TERM GOALS: Target date: 08/17/2024  Patient will reduce Neck Disability Index score to <10% to demonstrate minimal disability with ADL's including improved sleeping tolerance, sitting tolerance, etc for better mobility at home and work. Baseline: 06/22/24: 33/50 = 66%  Goal status: INITIAL  2.  Patient will report a worst pain of 3/10 on NRPS in neck to improve tolerance with ADLs and reduced symptoms with activities.  Baseline: 06/22/24: 10/10 worst  Goal status: INITIAL  3.  Patient will improve B UE strength by 1/3 MMT grade to demonstrate improvement in tolerance to activity and strength to perform ADLs independently.  Baseline: 06/22/24: see above Goal status: INITIAL  4.  Patient will improve cervical ROM in all directions by 10 degrees to improve ability to look in all directions while driving safely.  Baseline: 06/22/24: see above  Goal status: INITIAL  5.  Patient will report decrease in frequency of migraines to < 2 per week to demonstrate improvement in muscular tightness in cervical region and ability tolerate daily activities with reduction in pain.  Baseline: 06/22/24: daily  Goal status:  INITIAL   PLAN:  PT FREQUENCY: 1-2x/week  PT DURATION: 8 weeks  PLANNED INTERVENTIONS: [id number]- PT Re-evaluation, 97750- Physical Performance Testing, 97110-Therapeutic exercises, 97530- Therapeutic activity, 97112- Neuromuscular re-education, 97535- Self Care, [id number]- Manual therapy, [id number]- Electrical stimulation (unattended), [phone]- Electrical stimulation (manual), Patient/Family education, Joint mobilization, Joint manipulation, Spinal manipulation, Spinal mobilization, Cryotherapy, and Moist heat  PLAN FOR NEXT SESSION: HEP review, cervical distraction, manual therapy  Lonni Pall PT, DPT Physical Therapist- [facility]  [facility] 07/20/2024, 10:33 AM

## 2024-07-25 ENCOUNTER — Ambulatory Visit

## 2024-07-27 ENCOUNTER — Ambulatory Visit

## 2024-07-27 ENCOUNTER — Other Ambulatory Visit: Payer: Self-pay | Admitting: Nurse Practitioner

## 2024-07-27 DIAGNOSIS — M5412 Radiculopathy, cervical region: Secondary | ICD-10-CM

## 2024-07-27 DIAGNOSIS — R002 Palpitations: Secondary | ICD-10-CM

## 2024-07-27 DIAGNOSIS — M6281 Muscle weakness (generalized): Secondary | ICD-10-CM

## 2024-07-27 DIAGNOSIS — M542 Cervicalgia: Secondary | ICD-10-CM

## 2024-07-27 DIAGNOSIS — N949 Unspecified condition associated with female genital organs and menstrual cycle: Secondary | ICD-10-CM

## 2024-07-27 DIAGNOSIS — R0789 Other chest pain: Secondary | ICD-10-CM

## 2024-07-27 NOTE — Therapy (Signed)
 OUTPATIENT PHYSICAL THERAPY CERVICAL TREATMENT   Patient Name: Anita Gibbs MRN: [id number] DOB:[birth year], 53 y.o., female Today's Date: 07/27/2024  END OF SESSION:  PT End of Session - 07/27/24 1302     Visit Number 5    Number of Visits 17    Date for Recertification  08/17/24    Authorization Type [employer] auth#[id number] for 8 PT vsts from 10/28-12/17/25    Authorization Time Period 10/28-12/17/25    Authorization - Visit Number 4    Authorization - Number of Visits 8    PT Start Time 1301    PT Stop Time 1345    PT Time Calculation (min) 44 min    Activity Tolerance Patient tolerated treatment well    Behavior During Therapy [facility] [place] for tasks assessed/performed            Past Medical History:  Diagnosis Date   Abdominal pain 06/11/2020   Abnormal drug screen 03/06/2020   Abnormal MRI, cervical spine (06/10/2018) 09/27/2018   FINDINGS:  Vertebrae: fusion hardware at C4, C5, C6, and C7.  Posterior Fossa, vertebral arteries, paraspinal tissues: A relatively empty sella present.     Disc levels:  C3-4: Negative. A mild broad-based disc osteophyte complex present. Uncovertebral spurring contributes to mild foraminal narrowing bilaterally.  C4-5: A leftward disc osteophyte complex is present. Uncovertebral and facet disease   Acute cystitis without hematuria 06/18/2020   Acute vaginitis 06/11/2020   Adenoma of left adrenal gland 08/01/2020   Adrenal nodule 06/20/2020   Allergy     ANA positive 02/01/2021   Anxiety and depression 01/29/2021   Arthritis    Back pain with history of spinal surgery 01/29/2021   Bilateral carpal tunnel syndrome 01/29/2021   Bilateral leg edema 01/29/2021   BMI 40.0-44.9, adult (HCC) 04/30/2022   Carpal boss, right    Carpal tunnel syndrome, bilateral    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) 09/27/2018   Levels:  C4-5: There is partial effacement of ventral CSF.     IMPRESSION:  Mild residual central canal narrowing at C4-5 s/p ACDF.    Cervical facet hypertrophy (C3-T1) 09/27/2018   Levels:  C3-4: Uncovertebral spurring  C4-5: Uncovertebral and facet disease  C5-6: Residual uncovertebral disease.  C6-7: Residual uncovertebral disease.  C7-T1: Minimal left-sided uncovertebral spurring   Cervical facet joint syndrome (Bilateral) (L>R) 09/27/2018   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-5, C5-6, and C6-7) 09/27/2018   Levels:  C3-4: Mild foraminal narrowing bilaterally.  C4-5: Mild left foraminal narrowing.  C5-6: Mild left foraminal narrowing is due to residual uncovertebral disease.  C6-7: Mild left foraminal narrowing is due to residual uncovertebral disease.   Cervical myelopathy (HCC) 07/01/2017   Cervicalgia (Primary Area of Pain) (Bilateral) (L>R) 09/27/2018   Chronic gout of foot (Left) 05/31/2018   Chronic low back pain [facility] Area of Pain) (Bilateral) (R>L) w/ sciatica (Bilateral) 09/07/2018   Chronic low back pain [facility] Area of Pain) (Bilateral) (R>L) w/o sciatica 07/26/2018   Chronic lower extremity pain (Fourth Area of Pain) (Bilateral) (R>L) 09/07/2018   Chronic musculoskeletal pain 10/27/2018   Chronic neck pain (Bilateral) w/ history of cervical spinal surgery 09/27/2018   Chronic neck pain (Primary Area of Pain) (Bilateral) (L>R) 09/07/2018   Chronic sacroiliac joint dysfunction (Bilateral) 09/27/2018   Chronic sacroiliac joint pain (Right) 09/07/2018   Chronic upper extremity pain (Secondary Area of Pain) (Bilateral) (R>L) 09/27/2018   Disorder of skeletal system 09/07/2018   E. coli UTI (urinary tract infection) 06/20/2020   Elevated sed  rate 09/08/2018   Elevated serum creatinine 06/20/2020   Excessive daytime sleepiness 03/06/2020   Female pelvic pain 09/05/2020   GERD (gastroesophageal reflux disease)    Headache    History of allergy  to shellfish 09/28/2018   History of fusion of cervical spine (ACDF C4-C7) 09/27/2018   History of illicit drug use 03/07/2020   Hives  09/05/2020   Hives 09/05/2020   Hyperkalemia 05/31/2018   Mild   Hyperlipidemia 04/30/2022   Hypertension    Large breasts 05/30/2020   Left hip pain 07/21/2022   Lumbar facet arthropathy 04/20/2019   Lumbar radiculitis (L5 dermatome) (Right) 04/17/2020   Multiple environmental allergies 01/17/2020   Multiple food allergies 03/07/2020   Neurogenic pain 10/27/2018   Numbness and tingling of both feet 05/31/2018   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   Other intervertebral disc degeneration, lumbar region 11/15/2019   Pharmacologic therapy 09/07/2018   Plantar fasciitis, bilateral 03/04/2021   Polyneuropathy 01/29/2021   Prediabetes 10/10/2019   Problems influencing health status 09/07/2018   Rash 08/08/2019   Seasonal allergic rhinitis due to pollen 11/29/2018   Seasonal allergies 03/06/2020   Seasonal asthma 11/29/2018   Sjogren's disease    Sleep apnea    Somatic dysfunction of sacroiliac joint (Bilateral) 09/27/2018   Spondylosis without myelopathy or radiculopathy, cervical region 03/22/2019   Spondylosis without myelopathy or radiculopathy, lumbosacral region 05/19/2019   Spondylosis, cervical, w/ myelopathy 09/27/2018   URI with cough and congestion 06/16/2024   Vaginal discharge 06/11/2020   Past Surgical History:  Procedure Laterality Date   ABDOMINAL HYSTERECTOMY  10/17/2020   [facility]; she has both ovaries   ANTERIOR CERVICAL DECOMP/DISCECTOMY FUSION N/A 07/01/2017   Procedure: ANTERIOR CERVICAL DECOMPRESSION/DISCECTOMY FUSION 3 LEVELS;  Surgeon: Clois Fret, MD;  Location: [facility] ORS;  Service: Neurosurgery;  Laterality: N/A;   CARPAL TUNNEL RELEASE Bilateral    CHOLECYSTECTOMY     KNEE ARTHROSCOPY Right    Patient Active Problem List   Diagnosis Date Noted   URI with cough and congestion 06/16/2024   Hypokalemia 06/09/2024   Nausea 06/08/2024   Abdominal bloating 05/05/2024   Cervical radicular pain 03/22/2024   Bilateral occipital  neuralgia 03/22/2024   Sjogren syndrome with inflammatory arthritis 03/21/2024   MDD (major depressive disorder), recurrent episode, severe (HCC) 03/16/2024   Insomnia 02/25/2024   MDD (major depressive disorder), recurrent episode, moderate (HCC) 01/18/2024   OSA (obstructive sleep apnea) 01/18/2024   Seronegative spondyloarthropathy 01/05/2024   Episodic tension-type headache, not intractable 01/05/2024   Palpitations 11/24/2023   Wasp sting 11/24/2023   AKI (acute kidney injury) 11/02/2023   Other chest pain 10/29/2023   GAD (generalized anxiety disorder) 09/30/2023   Lower abdominal pain 07/22/2023   Abnormal glucose 04/24/2023   Mood disorder 04/24/2023   Annual physical exam 04/24/2023   Anemia 04/24/2023   Chronic frontal sinusitis 10/08/2022   Abdominal pain, left lower quadrant 06/30/2022   Morbid obesity with BMI of 40.0-44.9, adult (HCC) 04/30/2022   Allergic rhinitis 03/04/2021   Bilateral leg edema 01/29/2021   Lumbosacral radiculopathy at L5 01/29/2021   Neuropathy 01/29/2021   Vaginal discharge 06/11/2020   Bacterial vaginosis 06/11/2020   Pain medication agreement broken 03/07/2020   Prediabetes 10/10/2019   HLD (hyperlipidemia) 10/10/2019   Gastroesophageal reflux disease 09/06/2019   Fibroid uterus 09/06/2019   Seasonal asthma 11/29/2018   DDD (degenerative disc disease), cervical 09/27/2018   Cervical spondylosis 09/27/2018   DDD (degenerative disc disease), lumbar 09/27/2018   Vitamin D  deficiency 09/27/2018  Chronic pain syndrome 09/07/2018   Essential hypertension 05/31/2018    PCP: Gretel App, NP  REFERRING PROVIDER: Hilma Hastings, PA-C  REFERRING DIAG:  Z98.1 (ICD-10-CM) - S/P cervical spinal fusion M47.812 (ICD-10-CM) - Cervical spondylosis M54.12 (ICD-10-CM) - Cervical radiculopathy R20.0,R20.2 (ICD-10-CM) - Numbness and tingling in left hand  THERAPY DIAG:  Muscle weakness (generalized)  Cervicalgia  Radiculopathy, cervical  region  Rationale for Evaluation and Treatment: Rehabilitation  ONSET DATE: Chronic   SUBJECTIVE:                                                                                                                                                                                                         SUBJECTIVE STATEMENT: Patient reports she is having more pain recently and getting migraines. She is having trouble with dropping items in her hands. No issues with grip but maintaining the grip is difficult. The pain is more on the L than R. Patient reports if she turns her head too many times, she may get lightheaded.    Hand dominance: Left  PERTINENT HISTORY:  She is s/p C4-C7 ACDF on 07/01/17 by Dr. Clois. She continues with constant neck pain radiating down left arm to the hand causing numbness and tingling. She has intermittent pain in right harm. She has weakness in both hands.  PMH of HTN, asthma, GERD, chronic pain syndrome, hyperlipidemia, obesity.  PAIN:  Are you having pain? Yes: NPRS scale: 7/10; worst 10/10 Pain location: neck Pain description: aching, dull, throbbing Aggravating factors: housework, any activity  Relieving factors: heat, pain medication  PRECAUTIONS: None  RED FLAGS: Cervical red flags: Diplopia Yes:       WEIGHT BEARING RESTRICTIONS: No  FALLS:  Has patient fallen in last 6 months? No  LIVING ENVIRONMENT: Lives with: lives alone Lives in: House/apartment Has following equipment at home: Single point cane  OCCUPATION: not working, trying to get disability   PLOF: Independent - occasional assistance with ADLs when in severe pain  PATIENT GOALS: to see if I can get some type of relief    OBJECTIVE:  Note: Objective measures were completed at Evaluation unless otherwise noted.  DIAGNOSTIC FINDINGS:  N/A   PATIENT SURVEYS:  NDI:  NECK DISABILITY INDEX  Date: 06/22/2024 Score  Pain intensity 3 = The pain is fairly severe at the moment   2. Personal care (washing, dressing, etc.) 3 =  I need some help but can manage most of my personal care  3. Lifting 4 =  I can only lift very light weights  4. Reading 3 =  I can't read as much as I want because of moderate pain in my neck  5. Headaches 4 = I have severe headaches, which come frequently   6. Concentration 2 = I have a fair degree of difficulty in concentrating when I want to  7. Work 4 = I can hardly do any work at all  8. Driving 2 =  I can drive my car as long as I want with moderate pain in my neck  9. Sleeping 5 =  My sleep is completely disturbed (5-7 hrs sleepless)   10. Recreation 3 = I am able to engage in a few of my usual recreation activities because of pain in   my neck  Total 33/50 = 66%   Minimum Detectable Change (90% confidence): 5 points or 10% points  COGNITION: Overall cognitive status: Within functional limits for tasks assessed  SENSATION: Intermittent numbness/tingling in B hands (L>R)  POSTURE: rounded shoulders and forward head  PALPATION: Tightness noted to B UT, cervical paraspinals, suboccipitals  Tenderness noted but patient reports this is likely due to autoimmune disorder.   CERVICAL ROM:   Active ROM A/PROM (deg) eval  Flexion 25  Extension 28  Right lateral flexion 25  Left lateral flexion 15  Right rotation 10  Left rotation 20   (Blank rows = not tested)  UPPER EXTREMITY MMT:  MMT Right eval Left eval  Shoulder flexion 4 4  Shoulder extension    Shoulder abduction 4 4  Shoulder adduction    Shoulder extension    Shoulder internal rotation    Shoulder external rotation    Elbow flexion 4 4  Elbow extension 4 4  Wrist flexion    Wrist extension    Wrist ulnar deviation    Wrist radial deviation    Wrist pronation    Wrist supination     (Blank rows = not tested)  UPPER EXTREMITY ROM:  AROM WFL   CERVICAL SPECIAL TESTS:  Distraction test: Positive - decrease in L UE numbness/tingling    TREATMENT  DATE: 07/27/24                                                                                                                               Subjective: Patient with a report of 5/10 pain in the L upper neck. Patient reports that she was at the hospital for 3 days; ovarian related pain (L abdominal pain). She has performed some of the exercises while at the hospital. No further questions or concerns.   Manual Therapy (10 min billed):  Moderate STM applied to right and left upper trapezius, levator scapulae and cervical paraspinals musculature for pain modulation and decrease muscle tension. Myofascial release, trigger point. Pt endorsed improvements in pain following intervention.   Therapeutic Exercise:   UBE - 5 min - 2.5 min Fwd, 2.5 min Retro - Level 10-8 for UE  strength in shoulder and upper arm; PT manually adjusted  resistance throughout to patient's tolerance.    Seated Shoulder Row    1 x 10 - 15#   1 x 10 - 20#   1 x 10 - 25#    Seated Lat Pull Down   2 x 10  - 25#    1 x 10 - 30#      Neuromuscular Re-education (with intent to improve cervical/scapular posture):   Prone Scapular Retraction (Hands behind back)    2 x 10 x 1s hold    Prone W's with Scapular retraction   2 x 10    Prone I's    2 x 10 x 1s    Standing Low Row    3 x 10 - Blue TB    Standing Shoulder Horizontal Abduction with Scapular Retraction   3 x 10 - Red TB    PATIENT EDUCATION:  Education details: Exercise Technique  Person educated: Patient Education method: Explanation, Demonstration, and Handouts Education comprehension: verbalized understanding and returned demonstration  HOME EXERCISE PROGRAM: Access Code: [email or web address] URL: [email or web address] Date: 07/12/2024 Prepared by: Lonni Mekenna Finau  Exercises - Seated Upper Trapezius Stretch  - 2-3 x daily - 5-7 x weekly - 3-5 reps - 30-60 seconds hold - Seated Levator Scapulae Stretch  - 2-3 x daily - 5-7 x weekly - 3-5 reps -  30-60 second hold - Seated Shoulder Rolls  - 2-3 x daily - 5-7 x weekly - 1 sets - 10 reps - Supine Suboccipital Release with Tennis Balls  - 2-3 x daily - 5-7 x weekly - as needed --> 1-2 minutes  hold - Standing Shoulder Row with Anchored Resistance  - 1 x daily - 3-4 x weekly - 2-3 sets - 10-12 reps - Seated Scapular Retraction  - 1 x daily - 3-4 x weekly - 2-3 sets - 10-12 reps - Shoulder External Rotation and Scapular Retraction with Resistance  - 1 x daily - 3-4 x weekly - 2-3 sets - 10-12 reps  Access Code: [email or web address] URL: [email or web address] Date: 06/22/2024 Prepared by: Maryanne Finder  Exercises - Seated Upper Trapezius Stretch  - 2-3 x daily - 5-7 x weekly - 3-5 reps - 30-60 seconds hold - Seated Levator Scapulae Stretch  - 2-3 x daily - 5-7 x weekly - 3-5 reps - 30-60 second hold - Seated Shoulder Rolls  - 2-3 x daily - 5-7 x weekly - 1 sets - 10 reps - Supine Suboccipital Release with Tennis Balls  - 2-3 x daily - 5-7 x weekly - as needed --> 1-2 minutes  hold  ASSESSMENT:  CLINICAL IMPRESSION: Patient returns to OPPT in management of cervical pain. Session with main focus on strengthening postural muscles in order to reduce upper neck pain. Manual techniques utilized to reduce increased muscle tone in upper neck muscles. She tolerated increase in resistance with postural exercises; multimodal cues for proper retraction to reduce scapular elevation compensation.  Educated patient on parascapular strengthening in prone position. Patient progress with minimal improvements and she is still experiencing radicular pain in the L upper neck/shoulder. In addition to radicular pain; she has been experiencing abdominal pain secondary to suspected ovary involvement. This pain limiting her adherence to HEP.  Her pain still limits her from full participation with recreational and community based activities.  Based on today's performance, pt will continue to benefit from skilled PT in  order to facilitate return to PLOF and improve QoL.     OBJECTIVE IMPAIRMENTS: decreased balance, decreased endurance, decreased mobility, difficulty walking,  decreased ROM, decreased strength, hypomobility, impaired flexibility, impaired sensation, postural dysfunction, and pain.   ACTIVITY LIMITATIONS: carrying, lifting, squatting, stairs, transfers, bathing, and dressing  PARTICIPATION LIMITATIONS: cleaning, laundry, and community activity  PERSONAL FACTORS: Age, Behavior pattern, Past/current experiences, and Time since onset of injury/illness/exacerbation are also affecting patient's functional outcome.   REHAB POTENTIAL: Good  CLINICAL DECISION MAKING: Stable/uncomplicated  EVALUATION COMPLEXITY: Moderate   GOALS: Goals reviewed with patient? Yes  SHORT TERM GOALS: Target date: 07/20/2024  Patient will be independent in HEP to improve strength/mobility for better functional independence with ADLs. Baseline: 06/22/24: HEP initiated   Goal status: INITIAL  LONG TERM GOALS: Target date: 08/17/2024  Patient will reduce Neck Disability Index score to <10% to demonstrate minimal disability with ADL's including improved sleeping tolerance, sitting tolerance, etc for better mobility at home and work. Baseline: 06/22/24: 33/50 = 66%  Goal status: INITIAL  2.  Patient will report a worst pain of 3/10 on NRPS in neck to improve tolerance with ADLs and reduced symptoms with activities.  Baseline: 06/22/24: 10/10 worst  Goal status: INITIAL  3.  Patient will improve B UE strength by 1/3 MMT grade to demonstrate improvement in tolerance to activity and strength to perform ADLs independently.  Baseline: 06/22/24: see above Goal status: INITIAL  4.  Patient will improve cervical ROM in all directions by 10 degrees to improve ability to look in all directions while driving safely.  Baseline: 06/22/24: see above  Goal status: INITIAL  5.  Patient will report decrease in frequency of  migraines to < 2 per week to demonstrate improvement in muscular tightness in cervical region and ability tolerate daily activities with reduction in pain.  Baseline: 06/22/24: daily  Goal status: INITIAL   PLAN:  PT FREQUENCY: 1-2x/week  PT DURATION: 8 weeks  PLANNED INTERVENTIONS: 97164- PT Re-evaluation, 97750- Physical Performance Testing, 97110-Therapeutic exercises, 97530- Therapeutic activity, 97112- Neuromuscular re-education, 97535- Self Care, [id number]- Manual therapy, [id number]- Electrical stimulation (unattended), [phone]- Electrical stimulation (manual), Patient/Family education, Joint mobilization, Joint manipulation, Spinal manipulation, Spinal mobilization, Cryotherapy, and Moist heat  PLAN FOR NEXT SESSION: HEP review, cervical distraction, manual therapy  Lonni Pall PT, DPT Physical Therapist- [place]  [facility] 07/27/2024, 1:22 PM

## 2024-08-01 ENCOUNTER — Encounter: Payer: Self-pay | Admitting: Sleep Medicine

## 2024-08-01 ENCOUNTER — Ambulatory Visit: Payer: MEDICAID | Admitting: Sleep Medicine

## 2024-08-01 ENCOUNTER — Encounter: Admitting: Nurse Practitioner

## 2024-08-01 ENCOUNTER — Ambulatory Visit: Payer: MEDICAID

## 2024-08-01 VITALS — BP 120/80 | HR 59 | Temp 97.7°F | Ht 63.0 in | Wt 239.6 lb

## 2024-08-01 DIAGNOSIS — G4733 Obstructive sleep apnea (adult) (pediatric): Secondary | ICD-10-CM | POA: Diagnosis not present

## 2024-08-01 DIAGNOSIS — I10 Essential (primary) hypertension: Secondary | ICD-10-CM | POA: Diagnosis not present

## 2024-08-01 DIAGNOSIS — F5104 Psychophysiologic insomnia: Secondary | ICD-10-CM | POA: Diagnosis not present

## 2024-08-01 MED ORDER — TRAZODONE HCL 100 MG PO TABS: 100.0000 mg | ORAL_TABLET | Freq: Every day | ORAL | 3 refills | Status: AC

## 2024-08-01 NOTE — Progress Notes (Signed)
 Name:Anita Gibbs MRN: [id number] DOB: 21-Jul-1971   CHIEF COMPLAINT:  CPAP F/U   HISTORY OF PRESENT ILLNESS:  Anita Gibbs is a 53 y.o. w/ a h/o OSA, migraine headaches, chronic back pain, sciatica, morbid obesity, HTN, anxiety and depression who presents for CPAP F/U visit. Reports using CPAP therapy every night, which is confirmed by compliance data. She is currently using the Airfit F40 FFM, which is comfortable. Denies air leaks or nasal congestion. Reports some fragmented sleep and is having difficulty falling back to sleep. Overall reports feeling more refreshed upon awakening with CPAP therapy.   EPWORTH SLEEP SCORE    02/10/2024    1:00 PM 03/06/2020    9:00 AM  Results of the Epworth flowsheet  Sitting and reading 3 3  Watching TV 3 3  Sitting, inactive in a public place (e.g. 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), Adrenal nodule (06/20/2020), Allergy , ANA positive (02/01/2021), Anxiety and depression (01/29/2021), Arthritis, Back pain with history of spinal surgery (01/29/2021), Bilateral carpal tunnel syndrome (01/29/2021), Bilateral leg edema (01/29/2021), BMI 40.0-44.9, adult (HCC) (04/30/2022), Carpal boss, right, Carpal tunnel syndrome, bilateral, 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) (09/27/2018), Cervical facet hypertrophy (C3-T1) (09/27/2018), Cervical facet joint syndrome (Bilateral) (L>R)  (09/27/2018), 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-5, C5-6, and C6-7) (09/27/2018), Cervical myelopathy (HCC) (07/01/2017), Cervicalgia (Primary Area of Pain) (Bilateral) (L>R) (09/27/2018), Chronic gout of foot (Left) (05/31/2018), Chronic low back pain (Tertiary Area of Pain) (Bilateral) (R>L) w/ sciatica (Bilateral) (09/07/2018), Chronic low back pain (Tertiary Area of Pain) (Bilateral) (R>L) w/o sciatica (07/26/2018), Chronic lower extremity pain (Fourth Area of Pain) (Bilateral) (R>L) (09/07/2018), Chronic musculoskeletal pain (10/27/2018), Chronic neck pain (Bilateral) w/ history of cervical spinal surgery (09/27/2018), Chronic neck pain (Primary Area of Pain) (Bilateral) (L>R) (09/07/2018), Chronic sacroiliac joint dysfunction (Bilateral) (09/27/2018), Chronic sacroiliac joint pain (Right) (09/07/2018), Chronic upper extremity pain (Secondary Area of Pain) (Bilateral) (R>L) (09/27/2018), Disorder of skeletal system (09/07/2018), E. coli UTI (urinary tract infection) (06/20/2020), Elevated sed rate (09/08/2018), Elevated serum creatinine (06/20/2020), Excessive daytime sleepiness (03/06/2020), Female pelvic pain (09/05/2020), GERD (gastroesophageal reflux disease), Headache, History of allergy  to shellfish (09/28/2018), History of fusion of cervical spine (ACDF C4-C7) (09/27/2018), History of illicit drug use (03/07/2020), Hives (09/05/2020), Hives (09/05/2020), Hyperkalemia (05/31/2018), Hyperlipidemia (04/30/2022), Hypertension, Large breasts (05/30/2020), Left hip pain (07/21/2022), Lumbar facet arthropathy (04/20/2019), Lumbar radiculitis (L5 dermatome) (Right) (04/17/2020), Multiple environmental allergies (01/17/2020), Multiple food allergies (03/07/2020), Neurogenic pain (10/27/2018), Numbness and tingling of both feet (05/31/2018), 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), Other intervertebral disc degeneration, 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), Pharmacologic therapy  (09/07/2018), Plantar fasciitis, bilateral (03/04/2021), Polyneuropathy (01/29/2021), Prediabetes (10/10/2019), Problems influencing health status (09/07/2018), Rash (08/08/2019), Seasonal allergic rhinitis due to pollen (11/29/2018), Seasonal allergies (03/06/2020), Seasonal asthma (11/29/2018), Sjogren's disease, Sleep apnea, Somatic dysfunction of sacroiliac joint (Bilateral) (09/27/2018), Spondylosis without myelopathy or radiculopathy, cervical region (  03/22/2019), Spondylosis without myelopathy or radiculopathy, lumbosacral region (05/19/2019), Spondylosis, cervical, w/ myelopathy (09/27/2018), URI with cough and congestion (06/16/2024), and Vaginal discharge (06/11/2020).  has a past surgical history that includes Knee arthroscopy (Right); Anterior cervical decomp/discectomy fusion (N/A, 07/01/2017); Abdominal hysterectomy (10/17/2020); Carpal tunnel release (Bilateral); Cholecystectomy; and Tubal ligation (03/10/96). Prior to Admission medications   Medication Sig Start Date End Date Taking? Authorizing Provider  albuterol  (VENTOLIN  HFA) 108 (90 Base) MCG/ACT inhaler Inhale 2 puffs into the lungs every 6 (six) hours as needed for wheezing or shortness of breath. 10/23/23  Yes Gretel App, NP  amLODipine  (NORVASC ) 5 MG tablet Take 1 tablet (5 mg total) by mouth daily. 11/09/23  Yes Gretel App, NP  atorvastatin  (LIPITOR) 10 MG tablet MWF at night 04/08/23  Yes Hope Merle, MD  buprenorphine  (BUTRANS ) 15 MCG/HR Place 1 patch onto the skin once a week. 01/12/24  Yes Patel, Seema K, NP  busPIRone  (BUSPAR ) 10 MG tablet Take 2 tablets (20 mg total) by mouth 2 (two) times daily. 01/18/24  Yes Eappen, Saramma, MD  butalbital -acetaminophen -caffeine  (FIORICET) 50-325-40 MG tablet Take 1 tablet by mouth every 6 (six) hours as needed for headache. 01/05/24  Yes Gretel App, NP  cetirizine  (ZYRTEC  ALLERGY ) 10 MG tablet Take 1 tablet (10 mg total) by mouth at bedtime as needed. 04/08/23  Yes Hope Merle, MD   Cholecalciferol (VITAMIN D -3) 125 MCG (5000 UT) TABS Take by mouth daily.   Yes [provider]  Cyanocobalamin  (B-12 PO) Take by mouth.   Yes [provider]  dexamethasone  (DECADRON ) 1 MG tablet Take 1 mg by mouth once. 02/23/24  Yes [provider]  dicyclomine  (BENTYL ) 20 MG tablet TAKE 1 TABLET BY MOUTH FOUR TIMES DAILY (BEFORE MEALS AND AT BEDTIME) 01/28/24  Yes Gretel App, NP  DULoxetine  (CYMBALTA ) 60 MG capsule Take 1 capsule (60 mg total) by mouth 2 (two) times daily. 10/23/23  Yes Gretel App, NP  EPINEPHrine  (AUVI-Q ) 0.3 mg/0.3 mL IJ SOAJ injection Use as directed for severe allergic reaction 08/19/18  Yes Kozlow, Eric J, MD  fluticasone  (FLONASE ) 50 MCG/ACT nasal spray Place 2 sprays into both nostrils daily. 04/08/23  Yes Hope Merle, MD  hydrocortisone  2.5 % cream Apply topically 2 (two) times daily. Prn to eyelids 04/08/23  Yes Hope Merle, MD  hydrOXYzine  (VISTARIL ) 25 MG capsule Take 1 capsule (25 mg total) by mouth 3 (three) times daily. 11/09/23  Yes Gretel App, NP  ibuprofen (ADVIL) 800 MG tablet Take 800 mg by mouth 3 (three) times daily. 10/28/23  Yes [provider]  losartan  (COZAAR ) 100 MG tablet Take 1 tablet (100 mg total) by mouth daily. 04/08/23  Yes Hope Merle, MD  Melatonin 10 MG CAPS Take 10 mg by mouth at bedtime. 02/10/24  Yes Kasa, Kurian, MD  methocarbamol  (ROBAXIN ) 750 MG tablet Take 1 tablet (750 mg total) by mouth every 8 (eight) hours as needed for muscle spasms. 01/12/24 01/06/25 Yes Patel, Seema K, NP  montelukast  (SINGULAIR ) 10 MG tablet Take 1 tablet (10 mg total) by mouth at bedtime. 04/08/23  Yes Hope Merle, MD  pantoprazole  (PROTONIX ) 40 MG tablet TAKE ONE TABLET EVERY DAY 30 MIN BEFORE DINNER 04/08/23  Yes Walsh, Tanya, MD  polyethylene glycol powder (GLYCOLAX /MIRALAX ) 17 GM/SCOOP powder Take 17 g by mouth 2 (two) times daily as needed. 08/05/22  Yes Hope Merle, MD  potassium chloride  SA (KLOR-CON  M) 20 MEQ tablet Take 1  tablet (20 mEq total) by mouth daily as needed. With lasix   04/08/23  Yes Hope Merle, MD  pregabalin  (LYRICA ) 100 MG capsule Take 1 capsule (100 mg total) by mouth 3 (three) times daily. 02/25/24  Yes Marcelino Nurse, MD  saccharomyces boulardii (FLORASTOR) 250 MG capsule Take 1 capsule (250 mg total) by mouth daily. 04/08/23  Yes Hope Merle, MD  traZODone  (DESYREL ) 50 MG tablet Take 1-2 tablets (50-100 mg total) by mouth at bedtime as needed for sleep. 02/25/24  Yes Eappen, Saramma, MD  triamcinolone  cream (KENALOG ) 0.1 % Apply 1 Application topically 2 (two) times daily. 11/24/23  Yes Gretel App, NP  fluconazole  (DIFLUCAN ) 150 MG tablet Take 1 tablet by mouth x 1 dose. May repeat in 72 hours if needed. Patient not taking: Reported on 03/01/2024 08/31/23   Gretel App, NP  meloxicam  (MOBIC ) 15 MG tablet Take 1 tablet (15 mg total) by mouth every other day as needed. Patient not taking: Reported on 03/01/2024 12/24/21   Marcelino Nurse, MD   Allergies  Allergen Reactions   Other Hives    White and red sauces   Pineapple     Swelling  Other Reaction(s): Not available  pineapple allergenic extract   Pineapple Extract Swelling    pineapple extract   Shellfish Allergy  Hives   Strawberry (Diagnostic) Hives   Tomato Hives    cherry  Solanum (organism)    FAMILY HISTORY:  family history includes [name] in her mother; Arthritis in her father, mother, and sister; Breast cancer in her maternal aunt and paternal aunt; COPD in her brother and son; Cancer in her brother; Depression in her son; Diabetes in her mother; Early death in her brother; Hearing loss in her father; Hyperlipidemia in her mother; Hypertension in her father, mother, and sister; Kidney disease in her father; Lymphoma in her father; Post-traumatic stress disorder in her brother and son; Sleep apnea in her daughter. SOCIAL HISTORY:  reports that she has never smoked. She has never used smokeless tobacco. She reports current alcohol use  of about 2.0 standard drinks of alcohol per week. She reports that she does not use drugs.   Review of Systems:  Gen:  Denies  fever, sweats, chills weight loss  HEENT: Denies blurred vision, double vision, ear pain, eye pain, hearing loss, nose bleeds, sore throat Cardiac:  No dizziness, chest pain or heaviness, chest tightness,edema, No JVD Resp:   No cough, -sputum production, -shortness of breath,-wheezing, -hemoptysis,  Gi: Denies swallowing difficulty, stomach pain, nausea or vomiting, diarrhea, constipation, bowel incontinence Gu:  Denies bladder incontinence, burning urine Ext:   Denies Joint pain, stiffness or swelling Skin: Denies  skin rash, easy bruising or bleeding or hives Endoc:  Denies polyuria, polydipsia , polyphagia or weight change Psych:   Denies depression, insomnia or hallucinations  Other:  All other systems negative  VITAL SIGNS: BP 120/80   Pulse (!) 59   Temp 97.7 F (36.5 C)   Ht 5' 3 (1.6 m)   Wt 239 lb 9.6 oz (108.7 kg)   LMP  (LMP Unknown)   SpO2 99%   BMI 42.44 kg/m    Physical Examination:   General Appearance: No distress  EYES PERRLA, EOM intact.   NECK Supple, No JVD Pulmonary: normal breath sounds, No wheezing.  CardiovascularNormal S1,S2.  No m/r/g.   Abdomen: Benign, Soft, non-tender. Skin:   warm, no rashes, no ecchymosis  Extremities: normal, no cyanosis, clubbing. Neuro:without focal findings,  speech normal  PSYCHIATRIC: Mood, affect within normal limits.   ASSESSMENT AND PLAN  OSA Patient is  using and benefiting from CPAP therapy. Discussed the consequences of untreated sleep apnea. Advised not to drive drowsy for safety of patient and others. Will follow up in 3 months.    HTN Stable, on current management. Following with PCP.   Insomnia Increasing Trazodone  to 100 mg nightly. Also counseled patient on stimulus control and improving sleep hygiene practices.   Morbid obesity Stable, on Zepbound .    Patient  satisfied with Plan of action and management. All questions answered  I spent a total of 32 minutes reviewing chart data, face-to-face evaluation with the patient, counseling and coordination of care as detailed above.    Haelyn Forgey, M.D.  Sleep Medicine [facility]

## 2024-08-01 NOTE — Patient Instructions (Addendum)

## 2024-08-04 ENCOUNTER — Ambulatory Visit: Payer: MEDICAID | Attending: Orthopedic Surgery

## 2024-08-04 DIAGNOSIS — M5459 Other low back pain: Secondary | ICD-10-CM | POA: Diagnosis present

## 2024-08-04 DIAGNOSIS — M542 Cervicalgia: Secondary | ICD-10-CM | POA: Diagnosis present

## 2024-08-04 DIAGNOSIS — M6281 Muscle weakness (generalized): Secondary | ICD-10-CM | POA: Insufficient documentation

## 2024-08-04 DIAGNOSIS — M5412 Radiculopathy, cervical region: Secondary | ICD-10-CM | POA: Insufficient documentation

## 2024-08-04 NOTE — Therapy (Signed)
 OUTPATIENT PHYSICAL THERAPY CERVICAL TREATMENT   Patient Name: Anita Gibbs MRN: [id number] DOB:September 09, 1970, 53 y.o., female Today's Date: 08/04/2024  END OF SESSION:  PT End of Session - 08/04/24 0908     Visit Number 6    Number of Visits 17    Date for Recertification  08/17/24    Authorization Type [employer] [id number] for 10 PT vsts from 12/4-01/31/25    Authorization Time Period 12/4-01/31/25    Authorization - Visit Number 1    Authorization - Number of Visits 10    PT Start Time 0905    PT Stop Time 0945    PT Time Calculation (min) 40 min    Activity Tolerance Patient tolerated treatment well    Behavior During Therapy [facility] for tasks assessed/performed             Past Medical History:  Diagnosis Date   Abdominal pain 06/11/2020   Abnormal drug screen 03/06/2020   Abnormal MRI, cervical spine (06/10/2018) 09/27/2018   FINDINGS:  Vertebrae: fusion hardware at C4, C5, C6, and C7.  Posterior Fossa, vertebral arteries, paraspinal tissues: A relatively empty sella present.     Disc levels:  C3-4: Negative. A mild broad-based disc osteophyte complex present. Uncovertebral spurring contributes to mild foraminal narrowing bilaterally.  C4-5: A leftward disc osteophyte complex is present. Uncovertebral and facet disease   Acute cystitis without hematuria 06/18/2020   Acute vaginitis 06/11/2020   Adenoma of left adrenal gland 08/01/2020   Adrenal nodule 06/20/2020   Allergy     ANA positive 02/01/2021   Anxiety and depression 01/29/2021   Arthritis    Back pain with history of spinal surgery 01/29/2021   Bilateral carpal tunnel syndrome 01/29/2021   Bilateral leg edema 01/29/2021   BMI 40.0-44.9, adult (HCC) 04/30/2022   Carpal boss, right    Carpal tunnel syndrome, bilateral    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) 09/27/2018   Levels:  C4-5: There is partial effacement of ventral CSF.     IMPRESSION:  Mild residual central canal narrowing at C4-5 s/p ACDF.    Cervical facet hypertrophy (C3-T1) 09/27/2018   Levels:  C3-4: Uncovertebral spurring  C4-5: Uncovertebral and facet disease  C5-6: Residual uncovertebral disease.  C6-7: Residual uncovertebral disease.  C7-T1: Minimal left-sided uncovertebral spurring   Cervical facet joint syndrome (Bilateral) (L>R) 09/27/2018   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-5, C5-6, and C6-7) 09/27/2018   Levels:  C3-4: Mild foraminal narrowing bilaterally.  C4-5: Mild left foraminal narrowing.  C5-6: Mild left foraminal narrowing is due to residual uncovertebral disease.  C6-7: Mild left foraminal narrowing is due to residual uncovertebral disease.   Cervical myelopathy (HCC) 07/01/2017   Cervicalgia (Primary Area of Pain) (Bilateral) (L>R) 09/27/2018   Chronic gout of foot (Left) 05/31/2018   Chronic low back pain [facility] Area of Pain) (Bilateral) (R>L) w/ sciatica (Bilateral) 09/07/2018   Chronic low back pain [facility] Area of Pain) (Bilateral) (R>L) w/o sciatica 07/26/2018   Chronic lower extremity pain (Fourth Area of Pain) (Bilateral) (R>L) 09/07/2018   Chronic musculoskeletal pain 10/27/2018   Chronic neck pain (Bilateral) w/ history of cervical spinal surgery 09/27/2018   Chronic neck pain (Primary Area of Pain) (Bilateral) (L>R) 09/07/2018   Chronic sacroiliac joint dysfunction (Bilateral) 09/27/2018   Chronic sacroiliac joint pain (Right) 09/07/2018   Chronic upper extremity pain (Secondary Area of Pain) (Bilateral) (R>L) 09/27/2018   Disorder of skeletal system 09/07/2018   E. coli UTI (urinary tract infection) 06/20/2020   Elevated  sed rate 09/08/2018   Elevated serum creatinine 06/20/2020   Excessive daytime sleepiness 03/06/2020   Female pelvic pain 09/05/2020   GERD (gastroesophageal reflux disease)    Headache    History of allergy  to shellfish 09/28/2018   History of fusion of cervical spine (ACDF C4-C7) 09/27/2018   History of illicit drug use 03/07/2020   Hives 09/05/2020    Hives 09/05/2020   Hyperkalemia 05/31/2018   Mild   Hyperlipidemia 04/30/2022   Hypertension    Large breasts 05/30/2020   Left hip pain 07/21/2022   Lumbar facet arthropathy 04/20/2019   Lumbar radiculitis (L5 dermatome) (Right) 04/17/2020   Multiple environmental allergies 01/17/2020   Multiple food allergies 03/07/2020   Neurogenic pain 10/27/2018   Numbness and tingling of both feet 05/31/2018   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   Other intervertebral disc degeneration, lumbar region 11/15/2019   Pharmacologic therapy 09/07/2018   Plantar fasciitis, bilateral 03/04/2021   Polyneuropathy 01/29/2021   Prediabetes 10/10/2019   Problems influencing health status 09/07/2018   Rash 08/08/2019   Seasonal allergic rhinitis due to pollen 11/29/2018   Seasonal allergies 03/06/2020   Seasonal asthma 11/29/2018   Sjogren's disease    Sleep apnea    Somatic dysfunction of sacroiliac joint (Bilateral) 09/27/2018   Spondylosis without myelopathy or radiculopathy, cervical region 03/22/2019   Spondylosis without myelopathy or radiculopathy, lumbosacral region 05/19/2019   Spondylosis, cervical, w/ myelopathy 09/27/2018   URI with cough and congestion 06/16/2024   Vaginal discharge 06/11/2020   Past Surgical History:  Procedure Laterality Date   ABDOMINAL HYSTERECTOMY  10/17/2020   [facility]; she has both ovaries   ANTERIOR CERVICAL DECOMP/DISCECTOMY FUSION N/A 07/01/2017   Procedure: ANTERIOR CERVICAL DECOMPRESSION/DISCECTOMY FUSION 3 LEVELS;  Surgeon: Clois Fret, MD;  Location: [facility] ORS;  Service: Neurosurgery;  Laterality: N/A;   CARPAL TUNNEL RELEASE Bilateral    CHOLECYSTECTOMY     KNEE ARTHROSCOPY Right    TUBAL LIGATION  03/10/96   Patient Active Problem List   Diagnosis Date Noted   URI with cough and congestion 06/16/2024   Hypokalemia 06/09/2024   Nausea 06/08/2024   Abdominal bloating 05/05/2024   Cervical radicular pain 03/22/2024    Bilateral occipital neuralgia 03/22/2024   Sjogren syndrome with inflammatory arthritis 03/21/2024   MDD (major depressive disorder), recurrent episode, severe (HCC) 03/16/2024   Insomnia 02/25/2024   MDD (major depressive disorder), recurrent episode, moderate (HCC) 01/18/2024   OSA (obstructive sleep apnea) 01/18/2024   Seronegative spondyloarthropathy 01/05/2024   Episodic tension-type headache, not intractable 01/05/2024   Palpitations 11/24/2023   Wasp sting 11/24/2023   AKI (acute kidney injury) 11/02/2023   Other chest pain 10/29/2023   GAD (generalized anxiety disorder) 09/30/2023   Lower abdominal pain 07/22/2023   Abnormal glucose 04/24/2023   Mood disorder 04/24/2023   Annual physical exam 04/24/2023   Anemia 04/24/2023   Chronic frontal sinusitis 10/08/2022   Abdominal pain, left lower quadrant 06/30/2022   Morbid obesity with BMI of 40.0-44.9, adult (HCC) 04/30/2022   Allergic rhinitis 03/04/2021   Bilateral leg edema 01/29/2021   Lumbosacral radiculopathy at L5 01/29/2021   Neuropathy 01/29/2021   Vaginal discharge 06/11/2020   Bacterial vaginosis 06/11/2020   Pain medication agreement broken 03/07/2020   Prediabetes 10/10/2019   HLD (hyperlipidemia) 10/10/2019   Gastroesophageal reflux disease 09/06/2019   Fibroid uterus 09/06/2019   Seasonal asthma 11/29/2018   DDD (degenerative disc disease), cervical 09/27/2018   Cervical spondylosis 09/27/2018   DDD (degenerative disc disease), lumbar  09/27/2018   Vitamin D  deficiency 09/27/2018   Chronic pain syndrome 09/07/2018   Essential hypertension 05/31/2018    PCP: Gretel App, NP  REFERRING PROVIDER: Hilma Hastings, PA-C  REFERRING DIAG:  Z98.1 (ICD-10-CM) - S/P cervical spinal fusion M47.812 (ICD-10-CM) - Cervical spondylosis M54.12 (ICD-10-CM) - Cervical radiculopathy R20.0,R20.2 (ICD-10-CM) - Numbness and tingling in left hand  THERAPY DIAG:  No diagnosis found.  Rationale for Evaluation and  Treatment: Rehabilitation  ONSET DATE: Chronic   SUBJECTIVE:                                                                                                                                                                                                         SUBJECTIVE STATEMENT: Patient reports she is having more pain recently and getting migraines. She is having trouble with dropping items in her hands. No issues with grip but maintaining the grip is difficult. The pain is more on the L than R. Patient reports if she turns her head too many times, she may get lightheaded.    Hand dominance: Left  PERTINENT HISTORY:  She is s/p C4-C7 ACDF on 07/01/17 by Dr. Clois. She continues with constant neck pain radiating down left arm to the hand causing numbness and tingling. She has intermittent pain in right harm. She has weakness in both hands.  PMH of HTN, asthma, GERD, chronic pain syndrome, hyperlipidemia, obesity.  PAIN:  Are you having pain? Yes: NPRS scale: 7/10; worst 10/10 Pain location: neck Pain description: aching, dull, throbbing Aggravating factors: housework, any activity  Relieving factors: heat, pain medication  PRECAUTIONS: None  RED FLAGS: Cervical red flags: Diplopia Yes:       WEIGHT BEARING RESTRICTIONS: No  FALLS:  Has patient fallen in last 6 months? No  LIVING ENVIRONMENT: Lives with: lives alone Lives in: House/apartment Has following equipment at home: Single point cane  OCCUPATION: not working, trying to get disability   PLOF: Independent - occasional assistance with ADLs when in severe pain  PATIENT GOALS: to see if I can get some type of relief    OBJECTIVE:  Note: Objective measures were completed at Evaluation unless otherwise noted.  DIAGNOSTIC FINDINGS:  N/A   PATIENT SURVEYS:  NDI:  NECK DISABILITY INDEX  Date: 06/22/2024 Score  Pain intensity 3 = The pain is fairly severe at the moment  2. Personal care (washing, dressing,  etc.) 3 =  I need some help but can manage most of my personal care  3. Lifting 4 =  I can only lift very light weights  4. Reading 3 = I can't read as much as I want because of moderate pain in my neck  5. Headaches 4 = I have severe headaches, which come frequently   6. Concentration 2 = I have a fair degree of difficulty in concentrating when I want to  7. Work 4 = I can hardly do any work at all  8. Driving 2 =  I can drive my car as long as I want with moderate pain in my neck  9. Sleeping 5 =  My sleep is completely disturbed (5-7 hrs sleepless)   10. Recreation 3 = I am able to engage in a few of my usual recreation activities because of pain in   my neck  Total 33/50 = 66%   Minimum Detectable Change (90% confidence): 5 points or 10% points  COGNITION: Overall cognitive status: Within functional limits for tasks assessed  SENSATION: Intermittent numbness/tingling in B hands (L>R)  POSTURE: rounded shoulders and forward head  PALPATION: Tightness noted to B UT, cervical paraspinals, suboccipitals  Tenderness noted but patient reports this is likely due to autoimmune disorder.   CERVICAL ROM:   Active ROM A/PROM (deg) eval  Flexion 25  Extension 28  Right lateral flexion 25  Left lateral flexion 15  Right rotation 10  Left rotation 20   (Blank rows = not tested)  UPPER EXTREMITY MMT:  MMT Right eval Left eval  Shoulder flexion 4 4  Shoulder extension    Shoulder abduction 4 4  Shoulder adduction    Shoulder extension    Shoulder internal rotation    Shoulder external rotation    Elbow flexion 4 4  Elbow extension 4 4  Wrist flexion    Wrist extension    Wrist ulnar deviation    Wrist radial deviation    Wrist pronation    Wrist supination     (Blank rows = not tested)  UPPER EXTREMITY ROM:  AROM WFL   CERVICAL SPECIAL TESTS:  Distraction test: Positive - decrease in L UE numbness/tingling    TREATMENT DATE: 08/04/24                                                                                                                                Subjective:  Patient reports 3/10 pain in the neck prior to start of session. She reports increase in stress due to incurring additional expenses for heating bill. No further questions or concerns.   Manual Therapy (10 min billed):  Moderate STM applied to right and left upper trapezius, levator scapulae and cervical paraspinals musculature for pain modulation and decrease muscle tension. Myofascial release, trigger point release techniques utilized. Pt endorsed improvements in pain following intervention.   Therapeutic Exercise:   UBE - 5 min - 2.5 min Fwd, 2.5 min Retro - Level 10-8 for UE  strength in shoulder and upper arm; PT manually adjusted resistance throughout to patient's tolerance.  Seated Shoulder Row    2 x 10 - 20#   1 x 10 - 25#    Seated Lat Pull Down   3 x 10  - 25#   Neuromuscular Re-education (with intent to improve cervical/scapular posture):   Prone W's with Scapular retraction   2 x 10    1 x 10 - 2# DB in ea UE   Prone Y's    1 x 10 1 x 10 x 1s hold     Standing Shoulder Horizontal Abduction with Scapular Retraction   1 x 10 - Red TB   2 x 10 - Green TB  Standing Low Row    3 x 10 - Blue TB     PATIENT EDUCATION:  Education details: Exercise Technique  Person educated: Patient Education method: Programmer, Multimedia, Facilities Manager, and Handouts Education comprehension: verbalized understanding and returned demonstration  HOME EXERCISE PROGRAM: Access Code: [email or web address] URL: [email or web address] Date: 07/12/2024 Prepared by: Lonni Guthrie Lemme  Exercises - Seated Upper Trapezius Stretch  - 2-3 x daily - 5-7 x weekly - 3-5 reps - 30-60 seconds hold - Seated Levator Scapulae Stretch  - 2-3 x daily - 5-7 x weekly - 3-5 reps - 30-60 second hold - Seated Shoulder Rolls  - 2-3 x daily - 5-7 x weekly - 1 sets - 10 reps - Supine Suboccipital Release with  Tennis Balls  - 2-3 x daily - 5-7 x weekly - as needed --> 1-2 minutes  hold - Standing Shoulder Row with Anchored Resistance  - 1 x daily - 3-4 x weekly - 2-3 sets - 10-12 reps - Seated Scapular Retraction  - 1 x daily - 3-4 x weekly - 2-3 sets - 10-12 reps - Shoulder External Rotation and Scapular Retraction with Resistance  - 1 x daily - 3-4 x weekly - 2-3 sets - 10-12 reps  Access Code: [email or web address] URL: [email or web address] Date: 06/22/2024 Prepared by: Maryanne Finder  Exercises - Seated Upper Trapezius Stretch  - 2-3 x daily - 5-7 x weekly - 3-5 reps - 30-60 seconds hold - Seated Levator Scapulae Stretch  - 2-3 x daily - 5-7 x weekly - 3-5 reps - 30-60 second hold - Seated Shoulder Rolls  - 2-3 x daily - 5-7 x weekly - 1 sets - 10 reps - Supine Suboccipital Release with Tennis Balls  - 2-3 x daily - 5-7 x weekly - as needed --> 1-2 minutes  hold  ASSESSMENT:  CLINICAL IMPRESSION: Patient returns to OPPT in management of cervical pain. Session with a main focus on postural strengthening and manual techniques to reduce muscle tension in the upper neck muscles. Good carryover from previous session with prone scapular exercises; progressed activities to include light resistance. No updates to HEP in this appt.  She still has intermittent upper neck pain that limits her from full participation with recreational and community based activities.  Based on today's performance, pt will continue to benefit from skilled PT in order to facilitate return to PLOF and improve QoL.    OBJECTIVE IMPAIRMENTS: decreased balance, decreased endurance, decreased mobility, difficulty walking, decreased ROM, decreased strength, hypomobility, impaired flexibility, impaired sensation, postural dysfunction, and pain.   ACTIVITY LIMITATIONS: carrying, lifting, squatting, stairs, transfers, bathing, and dressing  PARTICIPATION LIMITATIONS: cleaning, laundry, and community activity  PERSONAL FACTORS:  Age, Behavior pattern, Past/current experiences, and Time since onset of injury/illness/exacerbation are also affecting patient's functional outcome.   REHAB POTENTIAL: Good  CLINICAL DECISION MAKING: Stable/uncomplicated  EVALUATION COMPLEXITY: Moderate  GOALS: Goals reviewed with patient? Yes  SHORT TERM GOALS: Target date: 07/20/2024  Patient will be independent in HEP to improve strength/mobility for better functional independence with ADLs. Baseline: 06/22/24: HEP initiated   Goal status: INITIAL  LONG TERM GOALS: Target date: 08/17/2024  Patient will reduce Neck Disability Index score to <10% to demonstrate minimal disability with ADL's including improved sleeping tolerance, sitting tolerance, etc for better mobility at home and work. Baseline: 06/22/24: 33/50 = 66%  Goal status: INITIAL  2.  Patient will report a worst pain of 3/10 on NRPS in neck to improve tolerance with ADLs and reduced symptoms with activities.  Baseline: 06/22/24: 10/10 worst  Goal status: INITIAL  3.  Patient will improve B UE strength by 1/3 MMT grade to demonstrate improvement in tolerance to activity and strength to perform ADLs independently.  Baseline: 06/22/24: see above Goal status: INITIAL  4.  Patient will improve cervical ROM in all directions by 10 degrees to improve ability to look in all directions while driving safely.  Baseline: 06/22/24: see above  Goal status: INITIAL  5.  Patient will report decrease in frequency of migraines to < 2 per week to demonstrate improvement in muscular tightness in cervical region and ability tolerate daily activities with reduction in pain.  Baseline: 06/22/24: daily; 08/04/24: 2-3x a week Goal status: Progressing   PLAN:  PT FREQUENCY: 1-2x/week  PT DURATION: 8 weeks  PLANNED INTERVENTIONS: 97164- PT Re-evaluation, 97750- Physical Performance Testing, 97110-Therapeutic exercises, 97530- Therapeutic activity, 97112- Neuromuscular re-education,  97535- Self Care, [id number]- Manual therapy, [id number]- Electrical stimulation (unattended), [phone]- Electrical stimulation (manual), Patient/Family education, Joint mobilization, Joint manipulation, Spinal manipulation, Spinal mobilization, Cryotherapy, and Moist heat  PLAN FOR NEXT SESSION: HEP review, cervical distraction, manual therapy  Lonni Pall PT, DPT Physical Therapist- [place]  [facility] 08/04/2024, 9:11 AM

## 2024-08-05 ENCOUNTER — Ambulatory Visit: Payer: MEDICAID | Attending: Cardiology | Admitting: Cardiology

## 2024-08-05 ENCOUNTER — Encounter: Payer: Self-pay | Admitting: Cardiology

## 2024-08-05 VITALS — BP 118/62 | HR 61 | Ht 63.0 in | Wt 239.6 lb

## 2024-08-05 DIAGNOSIS — I10 Essential (primary) hypertension: Secondary | ICD-10-CM | POA: Diagnosis not present

## 2024-08-05 DIAGNOSIS — I2510 Atherosclerotic heart disease of native coronary artery without angina pectoris: Secondary | ICD-10-CM

## 2024-08-05 DIAGNOSIS — E7800 Pure hypercholesterolemia, unspecified: Secondary | ICD-10-CM | POA: Diagnosis not present

## 2024-08-05 DIAGNOSIS — Z6841 Body Mass Index (BMI) 40.0 and over, adult: Secondary | ICD-10-CM

## 2024-08-05 MED ORDER — ISOSORBIDE MONONITRATE ER 30 MG PO TB24: 15.0000 mg | ORAL_TABLET | Freq: Every day | ORAL | 3 refills | Status: AC

## 2024-08-05 NOTE — Patient Instructions (Signed)
 Medication Instructions:  - START imdur  15 mg daily   *If you need a refill on your cardiac medications before your next appointment, please call your pharmacy*  Lab Work: No labs ordered today  If you have labs (blood work) drawn today and your tests are completely normal, you will receive your results only by: MyChart Message (if you have MyChart) OR A paper copy in the mail If you have any lab test that is abnormal or we need to change your treatment, we will call you to review the results.  Testing/Procedures: No test ordered today   Follow-Up: At [facility], you and your health needs are our priority.  As part of our continuing mission to provide you with exceptional heart care, our providers are all part of one team.  This team includes your primary Cardiologist (physician) and Advanced Practice Providers or APPs (Physician Assistants and Nurse Practitioners) who all work together to provide you with the care you need, when you need it.  Your next appointment:   3 month(s)  Provider:   You may see Dr Darliss or one of the following Advanced Practice Providers on your designated Care Team:   Lonni Meager, NP Lesley Maffucci, PA-C Bernardino Bring, PA-C Cadence Circle, PA-C Tylene Lunch, NP Barnie Hila, NP    We recommend signing up for the patient portal called MyChart.  Sign up information is provided on this After Visit Summary.  MyChart is used to connect with patients for Virtual Visits (Telemedicine).  Patients are able to view lab/test results, encounter notes, upcoming appointments, etc.  Non-urgent messages can be sent to your provider as well.   To learn more about what you can do with MyChart, go to [email or web address].

## 2024-08-05 NOTE — Progress Notes (Signed)
 Cardiology Office Note:    Date:  08/05/2024   ID:  Anita Gibbs, DOB [birth year], MRN [id number]  PCP:  Gretel App, NP   [facility] Providers Cardiologist:  None     Referring MD: Gretel App, NP   Chief Complaint  Patient presents with   New Patient (Initial Visit)    New pt has been doing well with no complaints of chest pain, chest pressure or SOB, medciation reviewed verbally with patient. Pt states she had some chest tightness yesterday    Anita Gibbs is a 53 y.o. female who is being seen today for the evaluation of chest pain at the request of Gretel App, NP.   History of Present Illness:    Anita Gibbs is a 53 y.o. female with a hx of nonobstructive CAD (30% mid LAD on LHC 10/25 ), hypertension, hyperlipidemia, obesity presenting with chest pain.  Endorses chest pain over the past 6 months.  Presented to [facility] about 6 weeks ago with abdominal discomfort and gallbladder issues, noted to have chest discomfort.  Echocardiogram obtained 06/2024 showed normal systolic function EF 60 to 65%. Left heart cath 06/2024 showed 30% stenosis in mid LAD.  Still complaining of chest discomfort especially with exertion.  Past Medical History:  Diagnosis Date   Abdominal pain 06/11/2020   Abnormal drug screen 03/06/2020   Abnormal MRI, cervical spine (06/10/2018) 09/27/2018   FINDINGS:  Vertebrae: fusion hardware at C4, C5, C6, and C7.  Posterior Fossa, vertebral arteries, paraspinal tissues: A relatively empty sella present.     Disc levels:  C3-4: Negative. A mild broad-based disc osteophyte complex present. Uncovertebral spurring contributes to mild foraminal narrowing bilaterally.  C4-5: A leftward disc osteophyte complex is present. Uncovertebral and facet disease   Acute cystitis without hematuria 06/18/2020   Acute vaginitis 06/11/2020   Adenoma of left adrenal gland 08/01/2020   Adrenal nodule 06/20/2020   Allergy     ANA positive 02/01/2021    Anxiety and depression 01/29/2021   Arthritis    Back pain with history of spinal surgery 01/29/2021   Bilateral carpal tunnel syndrome 01/29/2021   Bilateral leg edema 01/29/2021   BMI 40.0-44.9, adult (HCC) 04/30/2022   Carpal boss, right    Carpal tunnel syndrome, bilateral    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) 09/27/2018   Levels:  C4-5: There is partial effacement of ventral CSF.     IMPRESSION:  Mild residual central canal narrowing at C4-5 s/p ACDF.   Cervical facet hypertrophy (C3-T1) 09/27/2018   Levels:  C3-4: Uncovertebral spurring  C4-5: Uncovertebral and facet disease  C5-6: Residual uncovertebral disease.  C6-7: Residual uncovertebral disease.  C7-T1: Minimal left-sided uncovertebral spurring   Cervical facet joint syndrome (Bilateral) (L>R) 09/27/2018   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-5, C5-6, and C6-7) 09/27/2018   Levels:  C3-4: Mild foraminal narrowing bilaterally.  C4-5: Mild left foraminal narrowing.  C5-6: Mild left foraminal narrowing is due to residual uncovertebral disease.  C6-7: Mild left foraminal narrowing is due to residual uncovertebral disease.   Cervical myelopathy (HCC) 07/01/2017   Cervicalgia (Primary Area of Pain) (Bilateral) (L>R) 09/27/2018   Chronic gout of foot (Left) 05/31/2018   Chronic low back pain [facility] Area of Pain) (Bilateral) (R>L) w/ sciatica (Bilateral) 09/07/2018   Chronic low back pain [facility] Area of Pain) (Bilateral) (R>L) w/o sciatica 07/26/2018   Chronic lower extremity pain (Fourth Area of Pain) (Bilateral) (R>L) 09/07/2018   Chronic musculoskeletal pain 10/27/2018  Chronic neck pain (Bilateral) w/ history of cervical spinal surgery 09/27/2018   Chronic neck pain (Primary Area of Pain) (Bilateral) (L>R) 09/07/2018   Chronic sacroiliac joint dysfunction (Bilateral) 09/27/2018   Chronic sacroiliac joint pain (Right) 09/07/2018   Chronic upper extremity pain (Secondary Area of Pain) (Bilateral) (R>L)  09/27/2018   Disorder of skeletal system 09/07/2018   E. coli UTI (urinary tract infection) 06/20/2020   Elevated sed rate 09/08/2018   Elevated serum creatinine 06/20/2020   Excessive daytime sleepiness 03/06/2020   Female pelvic pain 09/05/2020   GERD (gastroesophageal reflux disease)    Headache    History of allergy  to shellfish 09/28/2018   History of fusion of cervical spine (ACDF C4-C7) 09/27/2018   History of illicit drug use 03/07/2020   Hives 09/05/2020   Hives 09/05/2020   Hyperkalemia 05/31/2018   Mild   Hyperlipidemia 04/30/2022   Hypertension    Large breasts 05/30/2020   Left hip pain 07/21/2022   Lumbar facet arthropathy 04/20/2019   Lumbar radiculitis (L5 dermatome) (Right) 04/17/2020   Multiple environmental allergies 01/17/2020   Multiple food allergies 03/07/2020   Neurogenic pain 10/27/2018   Numbness and tingling of both feet 05/31/2018   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   Other intervertebral disc degeneration, lumbar region 11/15/2019   Pharmacologic therapy 09/07/2018   Plantar fasciitis, bilateral 03/04/2021   Polyneuropathy 01/29/2021   Prediabetes 10/10/2019   Problems influencing health status 09/07/2018   Rash 08/08/2019   Seasonal allergic rhinitis due to pollen 11/29/2018   Seasonal allergies 03/06/2020   Seasonal asthma 11/29/2018   Sjogren's disease    Sleep apnea    Somatic dysfunction of sacroiliac joint (Bilateral) 09/27/2018   Spondylosis without myelopathy or radiculopathy, cervical region 03/22/2019   Spondylosis without myelopathy or radiculopathy, lumbosacral region 05/19/2019   Spondylosis, cervical, w/ myelopathy 09/27/2018   URI with cough and congestion 06/16/2024   Vaginal discharge 06/11/2020    Past Surgical History:  Procedure Laterality Date   ABDOMINAL HYSTERECTOMY  10/17/2020   [facility]; she has both ovaries   ANTERIOR CERVICAL DECOMP/DISCECTOMY FUSION N/A 07/01/2017   Procedure: ANTERIOR  CERVICAL DECOMPRESSION/DISCECTOMY FUSION 3 LEVELS;  Surgeon: Clois Fret, MD;  Location: [facility] ORS;  Service: Neurosurgery;  Laterality: N/A;   CARPAL TUNNEL RELEASE Bilateral    CHOLECYSTECTOMY     KNEE ARTHROSCOPY Right    TUBAL LIGATION  03/10/96    Current Medications: Current Meds  Medication Sig   albuterol  (VENTOLIN  HFA) 108 (90 Base) MCG/ACT inhaler Inhale 2 puffs into the lungs every 6 (six) hours as needed for wheezing or shortness of breath.   amLODipine  (NORVASC ) 5 MG tablet Take 1 tablet (5 mg total) by mouth daily. (Patient taking differently: Take 5 mg by mouth daily. Pt stated that she is taking 10 mg instead of 5 mg)   aspirin 81 MG chewable tablet Chew 81 mg by mouth daily.   atorvastatin  (LIPITOR) 10 MG tablet Take 1 tablet by mouth on Monday, Wednesday and Friday.   atorvastatin  (LIPITOR) 80 MG tablet Take 80 mg by mouth daily.   busPIRone  (BUSPAR ) 10 MG tablet Take 2 tablets (20 mg total) by mouth 2 (two) times daily.   butalbital -acetaminophen -caffeine  (FIORICET) 50-325-40 MG tablet Take 1 tablet by mouth every 6 (six) hours as needed for headache.   cetirizine  (ZYRTEC  ALLERGY ) 10 MG tablet Take 1 tablet (10 mg total) by mouth at bedtime as needed.   Cholecalciferol (VITAMIN D -3) 125 MCG (5000 UT) TABS Take by mouth  daily.   Cyanocobalamin  (B-12 PO) Take by mouth.   dicyclomine  (BENTYL ) 20 MG tablet TAKE 1 TABLET BY MOUTH FOUR TIMES DAILY (BEFORE MEALS AND AT BEDTIME)   DULoxetine  (CYMBALTA ) 60 MG capsule Take 1 capsule (60 mg total) by mouth 2 (two) times daily.   ENBREL SURECLICK 50 MG/ML injection Inject into the skin.   EPINEPHrine  (AUVI-Q ) 0.3 mg/0.3 mL IJ SOAJ injection Use as directed for severe allergic reaction   famotidine  (PEPCID ) 20 MG tablet Take 1 tablet (20 mg total) by mouth 2 (two) times daily.   fluconazole  (DIFLUCAN ) 150 MG tablet Take 1 tablet (150 mg total) by mouth once a week.   fluticasone  (FLONASE ) 50 MCG/ACT nasal spray Place 2 sprays  into both nostrils daily.   hydrochlorothiazide  (HYDRODIURIL ) 25 MG tablet    hydrocortisone  2.5 % cream Apply topically 2 (two) times daily. Prn to eyelids   hydrOXYzine  (VISTARIL ) 25 MG capsule Take 1 capsule (25 mg total) by mouth 3 (three) times daily.   isosorbide  mononitrate (IMDUR ) 30 MG 24 hr tablet Take 0.5 tablets (15 mg total) by mouth daily.   losartan  (COZAAR ) 100 MG tablet Take 1 tablet (100 mg total) by mouth daily.   Melatonin 10 MG CAPS Take 10 mg by mouth at bedtime.   methocarbamol  (ROBAXIN ) 750 MG tablet Take 1 tablet (750 mg total) by mouth every 8 (eight) hours as needed for muscle spasms.   montelukast  (SINGULAIR ) 10 MG tablet Take 1 tablet (10 mg total) by mouth at bedtime.   ondansetron  (ZOFRAN -ODT) 4 MG disintegrating tablet Take 1 tablet (4 mg total) by mouth every 8 (eight) hours as needed for nausea or vomiting.   pantoprazole  (PROTONIX ) 40 MG tablet TAKE ONE TABLET EVERY DAY 30 MIN BEFORE DINNER   polyethylene glycol powder (GLYCOLAX /MIRALAX ) 17 GM/SCOOP powder Take 17 g by mouth 2 (two) times daily as needed.   potassium chloride  SA (KLOR-CON  M) 20 MEQ tablet Take 1 tablet (20 mEq total) by mouth daily as needed. With lasix    predniSONE  (DELTASONE ) 20 MG tablet Take 2 tablets (40 mg total) by mouth daily.   pregabalin  (LYRICA ) 100 MG capsule Take 1 capsule (100 mg total) by mouth 3 (three) times daily.   saccharomyces boulardii (FLORASTOR) 250 MG capsule Take 1 capsule (250 mg total) by mouth daily.   tirzepatide  (ZEPBOUND ) 2.5 MG/0.5ML Pen Inject 2.5 mg into the skin once a week.   traMADol  (ULTRAM -ER) 100 MG 24 hr tablet Take 1 tablet (100 mg total) by mouth daily.   traZODone  (DESYREL ) 100 MG tablet Take 1 tablet (100 mg total) by mouth at bedtime.   traZODone  (DESYREL ) 50 MG tablet Take 1-2 tablets (50-100 mg total) by mouth at bedtime as needed for sleep.   triamcinolone  cream (KENALOG ) 0.1 % Apply 1 Application topically 2 (two) times daily.     Allergies:    Other, Pineapple, Pineapple extract, Shellfish allergy , Strawberry (diagnostic), and Tomato   Social History   Socioeconomic History   Marital status: Widowed    Spouse name: Not on file   Number of children: 2   Years of education: Not on file   Highest education level: Associate degree: occupational, scientist, product/process development, or vocational program  Occupational History   Not on file  Tobacco Use   Smoking status: Never   Smokeless tobacco: Never  Vaping Use   Vaping status: Never Used  Substance and Sexual Activity   Alcohol use: Yes    Alcohol/week: 2.0 standard drinks of alcohol  Types: 2 Glasses of wine per week    Comment: occ   Drug use: No   Sexual activity: Yes    Birth control/protection: None  Other Topics Concern   Not on file  Social History Narrative   Bus driver    And school custodian    1 daughter lives in [place] 98 y.o as of 01/17/20   1 son 24 almost 53 in [place]   [employer]    Social Drivers of Health   Financial Resource Strain: High Risk (06/15/2024)   Overall Financial Resource Strain (CARDIA)    Difficulty of Paying Living Expenses: Very hard  Food Insecurity: Food Insecurity Present (06/15/2024)   Hunger Vital Sign    Worried About Running Out of Food in the Last Year: Often true    Ran Out of Food in the Last Year: Often true  Transportation Needs: No Transportation Needs (06/15/2024)   PRAPARE - Administrator, Civil Service (Medical): No    Lack of Transportation (Non-Medical): No  Physical Activity: Inactive (06/15/2024)   Exercise Vital Sign    Days of Exercise per Week: 0 days    Minutes of Exercise per Session: Not on file  Stress: Stress Concern Present (06/15/2024)   [employer] of Occupational Health - Occupational Stress Questionnaire    Feeling of Stress: Rather much  Social Connections: Socially Isolated (06/15/2024)   Social Connection and Isolation Panel    Frequency of Communication with Friends  and Family: Twice a week    Frequency of Social Gatherings with Friends and Family: Never    Attends Religious Services: More than 4 times per year    Active Member of [employer] or Organizations: No    Attends Banker Meetings: Not on file    Marital Status: Widowed     Family History: The patient's family history includes [name] in her mother; Arthritis in her father, mother, and sister; Breast cancer in her maternal aunt and paternal aunt; COPD in her brother and son; Cancer in her brother; Depression in her son; Diabetes in her mother; Early death in her brother; Hearing loss in her father; Hyperlipidemia in her mother; Hypertension in her father, mother, and sister; Kidney disease in her father; Lymphoma in her father; Post-traumatic stress disorder in her brother and son; Sleep apnea in her daughter. There is no history of Allergic rhinitis, Angioedema, Eczema, Urticaria, or Asthma.  ROS:   Please see the history of present illness.     All other systems reviewed and are negative.  EKGs/Labs/Other Studies Reviewed:    The following studies were reviewed today:  EKG Interpretation Date/Time:  Friday August 05 2024 08:57:10 EST Ventricular Rate:  61 PR Interval:  162 QRS Duration:  82 QT Interval:  396 QTC Calculation: 398 R Axis:   46  Text Interpretation: Normal sinus rhythm Normal ECG Confirmed by Darliss Rogue ([id number]) on 08/05/2024 9:05:33 AM    Recent Labs: 06/16/2024: ALT 14; BUN 14; Creatinine, Ser 0.95; Hemoglobin 12.3; Platelets 233.0; Potassium 3.9; Sodium 141  Recent Lipid Panel    Component Value Date/Time   CHOL 246 (H) 04/08/2023 1223   TRIG 113.0 04/08/2023 1223   HDL 64.60 04/08/2023 1223   CHOLHDL 4 04/08/2023 1223   VLDL 22.6 04/08/2023 1223   LDLCALC 159 (H) 04/08/2023 1223     Risk Assessment/Calculations:             Physical Exam:    VS:  BP  118/62 (BP Location: Left Arm, Patient Position: Sitting, Cuff Size: Large)   Pulse  61   Ht 5' 3 (1.6 m)   Wt 239 lb 9.6 oz (108.7 kg)   LMP  (LMP Unknown)   SpO2 96%   BMI 42.44 kg/m     Wt Readings from Last 3 Encounters:  08/05/24 239 lb 9.6 oz (108.7 kg)  08/01/24 239 lb 9.6 oz (108.7 kg)  07/14/24 238 lb (108 kg)     GEN:  Well nourished, well developed in no acute distress HEENT: Normal NECK: No JVD; No carotid bruits CARDIAC: RRR, no murmurs, rubs, gallops RESPIRATORY:  Clear to auscultation without rales, wheezing or rhonchi  ABDOMEN: Soft, non-tender, non-distended MUSCULOSKELETAL:  No edema; No deformity  SKIN: Warm and dry NEUROLOGIC:  Alert and oriented x 3 PSYCHIATRIC:  Normal affect   ASSESSMENT:    1. Coronary artery disease involving native coronary artery of native heart, unspecified whether angina present   2. Primary hypertension   3. Pure hypercholesterolemia   4. Morbid obesity with BMI of 40.0-44.9, adult (HCC)    PLAN:    In order of problems listed above:  Nonobstructive CAD, 30% mid LAD.  EF 60 to 65% complaining of chest tightness.  Start Imdur  15 mg daily, continue aspirin 81 mg daily, Lipitor 80 mg daily. Hypertension, BP controlled.  Continue losartan  100 mg daily, Norvasc  5 mg daily.  Imdur  as above. Hyperlipidemia, Lipitor 80 mg daily. Morbid obesity, low-calorie diet, weight loss advised.  Follow-up in 3 months.      Medication Adjustments/Labs and Tests Ordered: Current medicines are reviewed at length with the patient today.  Concerns regarding medicines are outlined above.  Orders Placed This Encounter  Procedures   EKG 12-Lead   Meds ordered this encounter  Medications   isosorbide  mononitrate (IMDUR ) 30 MG 24 hr tablet    Sig: Take 0.5 tablets (15 mg total) by mouth daily.    Dispense:  60 tablet    Refill:  3    Patient Instructions  Medication Instructions:  - START imdur  15 mg daily   *If you need a refill on your cardiac medications before your next appointment, please call your pharmacy*  Lab  Work: No labs ordered today  If you have labs (blood work) drawn today and your tests are completely normal, you will receive your results only by: MyChart Message (if you have MyChart) OR A paper copy in the mail If you have any lab test that is abnormal or we need to change your treatment, we will call you to review the results.  Testing/Procedures: No test ordered today   Follow-Up: At [facility], you and your health needs are our priority.  As part of our continuing mission to provide you with exceptional heart care, our providers are all part of one team.  This team includes your primary Cardiologist (physician) and Advanced Practice Providers or APPs (Physician Assistants and Nurse Practitioners) who all work together to provide you with the care you need, when you need it.  Your next appointment:   3 month(s)  Provider:   You may see Dr Darliss or one of the following Advanced Practice Providers on your designated Care Team:   Lonni Meager, NP Lesley Maffucci, PA-C Bernardino Bring, PA-C Cadence Edgewood, PA-C Tylene Lunch, NP Barnie Hila, NP    We recommend signing up for the patient portal called MyChart.  Sign up information is provided on this After Visit Summary.  MyChart  is used to connect with patients for Virtual Visits (Telemedicine).  Patients are able to view lab/test results, encounter notes, upcoming appointments, etc.  Non-urgent messages can be sent to your provider as well.   To learn more about what you can do with MyChart, go to [email or web address].            Signed, Redell Cave, MD  08/05/2024 10:13 AM    [facility]

## 2024-08-08 ENCOUNTER — Ambulatory Visit: Payer: MEDICAID

## 2024-08-09 ENCOUNTER — Ambulatory Visit: Admitting: Orthopedic Surgery

## 2024-08-10 ENCOUNTER — Encounter: Payer: Self-pay | Admitting: Nurse Practitioner

## 2024-08-10 ENCOUNTER — Ambulatory Visit
Admission: RE | Admit: 2024-08-10 | Discharge: 2024-08-10 | Disposition: A | Discharge status: Home / Self Care | Payer: MEDICAID | Source: Ambulatory Visit | Attending: Nurse Practitioner | Admitting: Nurse Practitioner

## 2024-08-10 ENCOUNTER — Ambulatory Visit: Payer: MEDICAID

## 2024-08-10 DIAGNOSIS — N949 Unspecified condition associated with female genital organs and menstrual cycle: Secondary | ICD-10-CM | POA: Insufficient documentation

## 2024-08-10 DIAGNOSIS — M5459 Other low back pain: Secondary | ICD-10-CM

## 2024-08-10 DIAGNOSIS — M6281 Muscle weakness (generalized): Secondary | ICD-10-CM

## 2024-08-10 DIAGNOSIS — M5412 Radiculopathy, cervical region: Secondary | ICD-10-CM

## 2024-08-10 DIAGNOSIS — M542 Cervicalgia: Secondary | ICD-10-CM

## 2024-08-10 NOTE — Therapy (Signed)
 OUTPATIENT PHYSICAL THERAPY CERVICAL TREATMENT   Patient Name: Anita Gibbs MRN: [id number] DOB:12-04-1970, 53 y.o., female Today's Date: 08/10/2024  END OF SESSION:  PT End of Session - 08/10/24 0906     Visit Number 7    Number of Visits 17    Date for Recertification  08/17/24    Authorization Type [employer] [id number] for 10 PT vsts from 12/4-01/31/25    Authorization Time Period 12/4-01/31/25    Authorization - Visit Number 2    Authorization - Number of Visits 10    PT Start Time 0903    PT Stop Time 0942    PT Time Calculation (min) 39 min    Activity Tolerance Patient tolerated treatment well    Behavior During Therapy [facility] for tasks assessed/performed              Past Medical History:  Diagnosis Date   Abdominal pain 06/11/2020   Abnormal drug screen 03/06/2020   Abnormal MRI, cervical spine (06/10/2018) 09/27/2018   FINDINGS:  Vertebrae: fusion hardware at C4, C5, C6, and C7.  Posterior Fossa, vertebral arteries, paraspinal tissues: A relatively empty sella present.     Disc levels:  C3-4: Negative. A mild broad-based disc osteophyte complex present. Uncovertebral spurring contributes to mild foraminal narrowing bilaterally.  C4-5: A leftward disc osteophyte complex is present. Uncovertebral and facet disease   Acute cystitis without hematuria 06/18/2020   Acute vaginitis 06/11/2020   Adenoma of left adrenal gland 08/01/2020   Adrenal nodule 06/20/2020   Allergy     ANA positive 02/01/2021   Anxiety and depression 01/29/2021   Arthritis    Back pain with history of spinal surgery 01/29/2021   Bilateral carpal tunnel syndrome 01/29/2021   Bilateral leg edema 01/29/2021   BMI 40.0-44.9, adult (HCC) 04/30/2022   Carpal boss, right    Carpal tunnel syndrome, bilateral    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) 09/27/2018   Levels:  C4-5: There is partial effacement of ventral CSF.     IMPRESSION:  Mild residual central canal narrowing at C4-5 s/p ACDF.    Cervical facet hypertrophy (C3-T1) 09/27/2018   Levels:  C3-4: Uncovertebral spurring  C4-5: Uncovertebral and facet disease  C5-6: Residual uncovertebral disease.  C6-7: Residual uncovertebral disease.  C7-T1: Minimal left-sided uncovertebral spurring   Cervical facet joint syndrome (Bilateral) (L>R) 09/27/2018   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-5, C5-6, and C6-7) 09/27/2018   Levels:  C3-4: Mild foraminal narrowing bilaterally.  C4-5: Mild left foraminal narrowing.  C5-6: Mild left foraminal narrowing is due to residual uncovertebral disease.  C6-7: Mild left foraminal narrowing is due to residual uncovertebral disease.   Cervical myelopathy (HCC) 07/01/2017   Cervicalgia (Primary Area of Pain) (Bilateral) (L>R) 09/27/2018   Chronic gout of foot (Left) 05/31/2018   Chronic low back pain [facility] Area of Pain) (Bilateral) (R>L) w/ sciatica (Bilateral) 09/07/2018   Chronic low back pain [facility] Area of Pain) (Bilateral) (R>L) w/o sciatica 07/26/2018   Chronic lower extremity pain (Fourth Area of Pain) (Bilateral) (R>L) 09/07/2018   Chronic musculoskeletal pain 10/27/2018   Chronic neck pain (Bilateral) w/ history of cervical spinal surgery 09/27/2018   Chronic neck pain (Primary Area of Pain) (Bilateral) (L>R) 09/07/2018   Chronic sacroiliac joint dysfunction (Bilateral) 09/27/2018   Chronic sacroiliac joint pain (Right) 09/07/2018   Chronic upper extremity pain (Secondary Area of Pain) (Bilateral) (R>L) 09/27/2018   Disorder of skeletal system 09/07/2018   E. coli UTI (urinary tract infection) 06/20/2020  Elevated sed rate 09/08/2018   Elevated serum creatinine 06/20/2020   Excessive daytime sleepiness 03/06/2020   Female pelvic pain 09/05/2020   GERD (gastroesophageal reflux disease)    Headache    History of allergy  to shellfish 09/28/2018   History of fusion of cervical spine (ACDF C4-C7) 09/27/2018   History of illicit drug use 03/07/2020   Hives 09/05/2020    Hives 09/05/2020   Hyperkalemia 05/31/2018   Mild   Hyperlipidemia 04/30/2022   Hypertension    Large breasts 05/30/2020   Left hip pain 07/21/2022   Lumbar facet arthropathy 04/20/2019   Lumbar radiculitis (L5 dermatome) (Right) 04/17/2020   Multiple environmental allergies 01/17/2020   Multiple food allergies 03/07/2020   Neurogenic pain 10/27/2018   Numbness and tingling of both feet 05/31/2018   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   Other intervertebral disc degeneration, lumbar region 11/15/2019   Pharmacologic therapy 09/07/2018   Plantar fasciitis, bilateral 03/04/2021   Polyneuropathy 01/29/2021   Prediabetes 10/10/2019   Problems influencing health status 09/07/2018   Rash 08/08/2019   Seasonal allergic rhinitis due to pollen 11/29/2018   Seasonal allergies 03/06/2020   Seasonal asthma 11/29/2018   Sjogren's disease    Sleep apnea    Somatic dysfunction of sacroiliac joint (Bilateral) 09/27/2018   Spondylosis without myelopathy or radiculopathy, cervical region 03/22/2019   Spondylosis without myelopathy or radiculopathy, lumbosacral region 05/19/2019   Spondylosis, cervical, w/ myelopathy 09/27/2018   URI with cough and congestion 06/16/2024   Vaginal discharge 06/11/2020   Past Surgical History:  Procedure Laterality Date   ABDOMINAL HYSTERECTOMY  10/17/2020   [facility]; she has both ovaries   ANTERIOR CERVICAL DECOMP/DISCECTOMY FUSION N/A 07/01/2017   Procedure: ANTERIOR CERVICAL DECOMPRESSION/DISCECTOMY FUSION 3 LEVELS;  Surgeon: Clois Fret, MD;  Location: [facility] ORS;  Service: Neurosurgery;  Laterality: N/A;   CARPAL TUNNEL RELEASE Bilateral    CHOLECYSTECTOMY     KNEE ARTHROSCOPY Right    TUBAL LIGATION  03/10/96   Patient Active Problem List   Diagnosis Date Noted   URI with cough and congestion 06/16/2024   Hypokalemia 06/09/2024   Nausea 06/08/2024   Abdominal bloating 05/05/2024   Cervical radicular pain 03/22/2024    Bilateral occipital neuralgia 03/22/2024   Sjogren syndrome with inflammatory arthritis 03/21/2024   MDD (major depressive disorder), recurrent episode, severe (HCC) 03/16/2024   Insomnia 02/25/2024   MDD (major depressive disorder), recurrent episode, moderate (HCC) 01/18/2024   OSA (obstructive sleep apnea) 01/18/2024   Seronegative spondyloarthropathy 01/05/2024   Episodic tension-type headache, not intractable 01/05/2024   Palpitations 11/24/2023   Wasp sting 11/24/2023   AKI (acute kidney injury) 11/02/2023   Other chest pain 10/29/2023   GAD (generalized anxiety disorder) 09/30/2023   Lower abdominal pain 07/22/2023   Abnormal glucose 04/24/2023   Mood disorder 04/24/2023   Annual physical exam 04/24/2023   Anemia 04/24/2023   Chronic frontal sinusitis 10/08/2022   Abdominal pain, left lower quadrant 06/30/2022   Morbid obesity with BMI of 40.0-44.9, adult (HCC) 04/30/2022   Allergic rhinitis 03/04/2021   Bilateral leg edema 01/29/2021   Lumbosacral radiculopathy at L5 01/29/2021   Neuropathy 01/29/2021   Vaginal discharge 06/11/2020   Bacterial vaginosis 06/11/2020   Pain medication agreement broken 03/07/2020   Prediabetes 10/10/2019   HLD (hyperlipidemia) 10/10/2019   Gastroesophageal reflux disease 09/06/2019   Fibroid uterus 09/06/2019   Seasonal asthma 11/29/2018   DDD (degenerative disc disease), cervical 09/27/2018   Cervical spondylosis 09/27/2018   DDD (degenerative disc disease),  lumbar 09/27/2018   Vitamin D  deficiency 09/27/2018   Chronic pain syndrome 09/07/2018   Essential hypertension 05/31/2018    PCP: Gretel App, NP  REFERRING PROVIDER: Hilma Hastings, PA-C  REFERRING DIAG:  Z98.1 (ICD-10-CM) - S/P cervical spinal fusion M47.812 (ICD-10-CM) - Cervical spondylosis M54.12 (ICD-10-CM) - Cervical radiculopathy R20.0,R20.2 (ICD-10-CM) - Numbness and tingling in left hand  THERAPY DIAG:  Muscle weakness  (generalized)  Cervicalgia  Radiculopathy, cervical region  Other low back pain  Rationale for Evaluation and Treatment: Rehabilitation  ONSET DATE: Chronic   SUBJECTIVE:                                                                                                                                                                                                         SUBJECTIVE STATEMENT: Patient reports she is having more pain recently and getting migraines. She is having trouble with dropping items in her hands. No issues with grip but maintaining the grip is difficult. The pain is more on the L than R. Patient reports if she turns her head too many times, she may get lightheaded.    Hand dominance: Left  PERTINENT HISTORY:  She is s/p C4-C7 ACDF on 07/01/17 by Dr. Clois. She continues with constant neck pain radiating down left arm to the hand causing numbness and tingling. She has intermittent pain in right harm. She has weakness in both hands.  PMH of HTN, asthma, GERD, chronic pain syndrome, hyperlipidemia, obesity.  PAIN:  Are you having pain? Yes: NPRS scale: 7/10; worst 10/10 Pain location: neck Pain description: aching, dull, throbbing Aggravating factors: housework, any activity  Relieving factors: heat, pain medication  PRECAUTIONS: None  RED FLAGS: Cervical red flags: Diplopia Yes:       WEIGHT BEARING RESTRICTIONS: No  FALLS:  Has patient fallen in last 6 months? No  LIVING ENVIRONMENT: Lives with: lives alone Lives in: House/apartment Has following equipment at home: Single point cane  OCCUPATION: not working, trying to get disability   PLOF: Independent - occasional assistance with ADLs when in severe pain  PATIENT GOALS: to see if I can get some type of relief    OBJECTIVE:  Note: Objective measures were completed at Evaluation unless otherwise noted.  DIAGNOSTIC FINDINGS:  N/A   PATIENT SURVEYS:  NDI:  NECK DISABILITY INDEX  Date:  06/22/2024 Score  Pain intensity 3 = The pain is fairly severe at the moment  2. Personal care (washing, dressing, etc.) 3 =  I need some help but can manage most of my personal care  3.  Lifting 4 =  I can only lift very light weights  4. Reading 3 = I can't read as much as I want because of moderate pain in my neck  5. Headaches 4 = I have severe headaches, which come frequently   6. Concentration 2 = I have a fair degree of difficulty in concentrating when I want to  7. Work 4 = I can hardly do any work at all  8. Driving 2 =  I can drive my car as long as I want with moderate pain in my neck  9. Sleeping 5 =  My sleep is completely disturbed (5-7 hrs sleepless)   10. Recreation 3 = I am able to engage in a few of my usual recreation activities because of pain in   my neck  Total 33/50 = 66%   Minimum Detectable Change (90% confidence): 5 points or 10% points  COGNITION: Overall cognitive status: Within functional limits for tasks assessed  SENSATION: Intermittent numbness/tingling in B hands (L>R)  POSTURE: rounded shoulders and forward head  PALPATION: Tightness noted to B UT, cervical paraspinals, suboccipitals  Tenderness noted but patient reports this is likely due to autoimmune disorder.   CERVICAL ROM:   Active ROM A/PROM (deg) eval  Flexion 25  Extension 28  Right lateral flexion 25  Left lateral flexion 15  Right rotation 10  Left rotation 20   (Blank rows = not tested)  UPPER EXTREMITY MMT:  MMT Right eval Left eval  Shoulder flexion 4 4  Shoulder extension    Shoulder abduction 4 4  Shoulder adduction    Shoulder extension    Shoulder internal rotation    Shoulder external rotation    Elbow flexion 4 4  Elbow extension 4 4  Wrist flexion    Wrist extension    Wrist ulnar deviation    Wrist radial deviation    Wrist pronation    Wrist supination     (Blank rows = not tested)  UPPER EXTREMITY ROM:  AROM WFL   CERVICAL SPECIAL TESTS:   Distraction test: Positive - decrease in L UE numbness/tingling    TREATMENT DATE: 08/10/24                                                                                                                               Subjective: Patient reports 0/10 pain however she still has upper neck tightness and bilateral numbness/tingling (L > R). She also reports a couple headaches throughout the week. No further questions or concerns.   Therapeutic Exercise:   UBE - 5 min - 2.5 min Fwd, 2.5 min Retro - Level 10-8 for UE  strength in shoulder and upper arm; PT manually adjusted resistance throughout to patient's tolerance.    Cervical Isometric against PT Resistance   R Sidebending: 2 x 10 x 5s hold    L Sidebending: 2 x 10 x 5s Hold  Cervical Retraction: 2 x 10 x 5s hold  Neuromuscular Re-education (with intent to improve cervical/scapular posture):  First Rib Mobilization with Strap      R/L: 1 x 10 x 5s hold    Prone Y   1 x 10 - AROM    2 x 10 - 2# DB    Prone W    1 x 10 - AROM    2 x 10 - 2# DB   Seated Band Pull Apart @ 90s flex   3 x 10 - GTB   Standing Low Row    2 x 10 - Blue TB     PATIENT EDUCATION:  Education details: Exercise Technique  Person educated: Patient Education method: Explanation, Demonstration, and Handouts Education comprehension: verbalized understanding and returned demonstration  HOME EXERCISE PROGRAM: Access Code: [email or web address] URL: [email or web address] Date: 08/10/2024 Prepared by: Lonni Pall  Exercises - Seated Upper Trapezius Stretch  - 2-3 x daily - 5-7 x weekly - 3-5 reps - 30-60 seconds hold - Seated Levator Scapulae Stretch  - 2-3 x daily - 5-7 x weekly - 3-5 reps - 30-60 second hold - Seated Shoulder Rolls  - 2-3 x daily - 5-7 x weekly - 1 sets - 10 reps - Supine Suboccipital Release with Tennis Balls  - 2-3 x daily - 5-7 x weekly - as needed --> 1-2 minutes  hold - Standing Shoulder Row with Anchored Resistance  - 1  x daily - 3-4 x weekly - 2-3 sets - 10-12 reps - Seated Scapular Retraction  - 1 x daily - 3-4 x weekly - 2-3 sets - 10-12 reps - Shoulder External Rotation and Scapular Retraction with Resistance  - 1 x daily - 3-4 x weekly - 2-3 sets - 10-12 reps - Prone Shoulder Horizontal Abduction with External Rotation  - 1 x daily - 3-4 x weekly - 2-3 sets - 10-12 reps - First Rib Mobilization with Strap  - 1 x daily - 7 x weekly - 2-3 sets - 10 reps - 3s hold  Access Code: [email or web address] URL: [email or web address] Date: 07/12/2024 Prepared by: Lonni Eppie Barhorst  Exercises - Seated Upper Trapezius Stretch  - 2-3 x daily - 5-7 x weekly - 3-5 reps - 30-60 seconds hold - Seated Levator Scapulae Stretch  - 2-3 x daily - 5-7 x weekly - 3-5 reps - 30-60 second hold - Seated Shoulder Rolls  - 2-3 x daily - 5-7 x weekly - 1 sets - 10 reps - Supine Suboccipital Release with Tennis Balls  - 2-3 x daily - 5-7 x weekly - as needed --> 1-2 minutes  hold - Standing Shoulder Row with Anchored Resistance  - 1 x daily - 3-4 x weekly - 2-3 sets - 10-12 reps - Seated Scapular Retraction  - 1 x daily - 3-4 x weekly - 2-3 sets - 10-12 reps - Shoulder External Rotation and Scapular Retraction with Resistance  - 1 x daily - 3-4 x weekly - 2-3 sets - 10-12 reps   ASSESSMENT:  CLINICAL IMPRESSION: Patient returns to OPPT in management of cervical pain. Session with a main focus on postural strengthening for parascapular muscles. Readdressed cervical paraspinals with isometrics against PT resistance. Improved shoulder posture following PT interventions. Encouraged adherence to HEP in order to maintain progress. She still has intermittent upper neck pain that limits her from full participation with recreational and community based activities.  Based on today's performance, pt will continue to benefit from skilled PT in order to facilitate  return to PLOF and improve QoL.    OBJECTIVE IMPAIRMENTS: decreased balance,  decreased endurance, decreased mobility, difficulty walking, decreased ROM, decreased strength, hypomobility, impaired flexibility, impaired sensation, postural dysfunction, and pain.   ACTIVITY LIMITATIONS: carrying, lifting, squatting, stairs, transfers, bathing, and dressing  PARTICIPATION LIMITATIONS: cleaning, laundry, and community activity  PERSONAL FACTORS: Age, Behavior pattern, Past/current experiences, and Time since onset of injury/illness/exacerbation are also affecting patient's functional outcome.   REHAB POTENTIAL: Good  CLINICAL DECISION MAKING: Stable/uncomplicated  EVALUATION COMPLEXITY: Moderate   GOALS: Goals reviewed with patient? Yes  SHORT TERM GOALS: Target date: 07/20/2024  Patient will be independent in HEP to improve strength/mobility for better functional independence with ADLs. Baseline: 06/22/24: HEP initiated   Goal status: INITIAL  LONG TERM GOALS: Target date: 08/17/2024  Patient will reduce Neck Disability Index score to <10% to demonstrate minimal disability with ADL's including improved sleeping tolerance, sitting tolerance, etc for better mobility at home and work. Baseline: 06/22/24: 33/50 = 66%  Goal status: INITIAL  2.  Patient will report a worst pain of 3/10 on NRPS in neck to improve tolerance with ADLs and reduced symptoms with activities.  Baseline: 06/22/24: 10/10 worst  Goal status: INITIAL  3.  Patient will improve B UE strength by 1/3 MMT grade to demonstrate improvement in tolerance to activity and strength to perform ADLs independently.  Baseline: 06/22/24: see above Goal status: INITIAL  4.  Patient will improve cervical ROM in all directions by 10 degrees to improve ability to look in all directions while driving safely.  Baseline: 06/22/24: see above  Goal status: INITIAL  5.  Patient will report decrease in frequency of migraines to < 2 per week to demonstrate improvement in muscular tightness in cervical region and  ability tolerate daily activities with reduction in pain.  Baseline: 06/22/24: daily; 08/04/24: 2-3x a week Goal status: Progressing   PLAN:  PT FREQUENCY: 1-2x/week  PT DURATION: 8 weeks  PLANNED INTERVENTIONS: 97164- PT Re-evaluation, 97750- Physical Performance Testing, 97110-Therapeutic exercises, 97530- Therapeutic activity, 97112- Neuromuscular re-education, 97535- Self Care, [id number]- Manual therapy, [id number]- Electrical stimulation (unattended), [phone]- Electrical stimulation (manual), Patient/Family education, Joint mobilization, Joint manipulation, Spinal manipulation, Spinal mobilization, Cryotherapy, and Moist heat  PLAN FOR NEXT SESSION: HEP review, cervical distraction, manual therapy  Lonni Pall PT, DPT Physical Therapist- [place]  [facility] 08/10/2024, 9:11 AM

## 2024-08-15 ENCOUNTER — Encounter: Payer: Self-pay | Admitting: Sleep Medicine

## 2024-08-15 ENCOUNTER — Other Ambulatory Visit (HOSPITAL_COMMUNITY): Payer: Self-pay

## 2024-08-15 ENCOUNTER — Telehealth: Payer: Self-pay

## 2024-08-15 ENCOUNTER — Ambulatory Visit: Payer: MEDICAID

## 2024-08-15 NOTE — Telephone Encounter (Signed)
*  [facility]  Pharmacy Patient Advocate Encounter  Received notification from [facility] that Prior Authorization for Zepbound   has been CANCELLED due to  Patient shows MILD OSA which is not eligible for therapy.

## 2024-08-16 NOTE — Telephone Encounter (Signed)
 Noted

## 2024-08-17 ENCOUNTER — Ambulatory Visit: Payer: MEDICAID

## 2024-08-18 ENCOUNTER — Encounter: Payer: Self-pay | Admitting: Psychiatry

## 2024-08-18 ENCOUNTER — Ambulatory Visit (INDEPENDENT_AMBULATORY_CARE_PROVIDER_SITE_OTHER): Payer: MEDICAID | Admitting: Psychiatry

## 2024-08-18 ENCOUNTER — Other Ambulatory Visit: Payer: Self-pay

## 2024-08-18 VITALS — BP 122/72 | HR 86 | Temp 97.4°F | Ht 63.0 in | Wt 236.2 lb

## 2024-08-18 DIAGNOSIS — F411 Generalized anxiety disorder: Secondary | ICD-10-CM | POA: Diagnosis not present

## 2024-08-18 DIAGNOSIS — G4701 Insomnia due to medical condition: Secondary | ICD-10-CM | POA: Diagnosis not present

## 2024-08-18 DIAGNOSIS — F3342 Major depressive disorder, recurrent, in full remission: Secondary | ICD-10-CM

## 2024-08-18 NOTE — Progress Notes (Unsigned)
 [facility] MD OP Progress Note  08/18/2024 10:47 AM Anita Gibbs  MRN:  [id number]  Chief Complaint:  Chief Complaint  Patient presents with   Follow-up   Anxiety   Medication Refill   Depression   Discussed the use of AI scribe software for clinical note transcription with the patient, who gave verbal consent to proceed.  History of Present Illness Anita Gibbs is a 53 year old African-American female, previously widowed, currently engaged, unemployed, lives in [place], has a history of MDD, GAD, chronic pain, hypertension, GERD, obesity, seronegative spondyloarthropathy was evaluated in office today for a follow-up appointment.  Since her last visit in September, she notes that her mood and functioning initially improved but have recently worsened.Ongoing interpersonal stress related to her sister continues, as she reports that her sister is causing distress by contacting her or sending others to her home, which she finds difficult to manage and is attempting to avoid.  She also struggles with chronic pain which contributes to her mood symptoms.  She however reports the current medication regimen which is BuSpar , duloxetine  along with her psychotherapy sessions with Ms. [name] has helped her to maintain her symptoms.  She reports her anxiety is currently well-controlled.  She does struggle with sleep due to CPAP problems.  She is trying to get in touch with her CPAP provider to fix it.  She currently on a higher dose of trazodone .  Tolerating it well.  She denies any suicidality, homicidality or perceptual disturbances.  She denies any other concerns today.     Visit Diagnosis:    ICD-10-CM   1. Recurrent major depressive disorder, in full remission  F33.42     2. Generalized anxiety disorder  F41.1     3. Insomnia due to medical condition  G47.01    Rule out sleep apnea, pain      Past Psychiatric History: I have reviewed past psychiatric history from progress note  on 01/18/2024.  Past trials of medications like Cymbalta , hydroxyzine , BuSpar , trazodone .  Completed PHP program at [facility] [place]-03/25/2024.  Past Medical History:  Past Medical History:  Diagnosis Date   Abdominal pain 06/11/2020   Abnormal drug screen 03/06/2020   Abnormal MRI, cervical spine (06/10/2018) 09/27/2018   FINDINGS:  Vertebrae: fusion hardware at C4, C5, C6, and C7.  Posterior Fossa, vertebral arteries, paraspinal tissues: A relatively empty sella present.     Disc levels:  C3-4: Negative. A mild broad-based disc osteophyte complex present. Uncovertebral spurring contributes to mild foraminal narrowing bilaterally.  C4-5: A leftward disc osteophyte complex is present. Uncovertebral and facet disease   Acute cystitis without hematuria 06/18/2020   Acute vaginitis 06/11/2020   Adenoma of left adrenal gland 08/01/2020   Adrenal nodule 06/20/2020   Allergy     ANA positive 02/01/2021   Anxiety and depression 01/29/2021   Arthritis    Back pain with history of spinal surgery 01/29/2021   Bilateral carpal tunnel syndrome 01/29/2021   Bilateral leg edema 01/29/2021   BMI 40.0-44.9, adult (HCC) 04/30/2022   Carpal boss, right    Carpal tunnel syndrome, bilateral    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) 09/27/2018   Levels:  C4-5: There is partial effacement of ventral CSF.     IMPRESSION:  Mild residual central canal narrowing at C4-5 s/p ACDF.   Cervical facet hypertrophy (C3-T1) 09/27/2018   Levels:  C3-4: Uncovertebral spurring  C4-5: Uncovertebral and facet disease  C5-6: Residual uncovertebral disease.  C6-7: Residual uncovertebral disease.  C7-T1: Minimal  left-sided uncovertebral spurring   Cervical facet joint syndrome (Bilateral) (L>R) 09/27/2018   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-5, C5-6, and C6-7) 09/27/2018   Levels:  C3-4: Mild foraminal narrowing bilaterally.  C4-5: Mild left foraminal narrowing.  C5-6: Mild left foraminal narrowing is  due to residual uncovertebral disease.  C6-7: Mild left foraminal narrowing is due to residual uncovertebral disease.   Cervical myelopathy (HCC) 07/01/2017   Cervicalgia (Primary Area of Pain) (Bilateral) (L>R) 09/27/2018   Chronic gout of foot (Left) 05/31/2018   Chronic low back pain [facility] Area of Pain) (Bilateral) (R>L) w/ sciatica (Bilateral) 09/07/2018   Chronic low back pain [facility] Area of Pain) (Bilateral) (R>L) w/o sciatica 07/26/2018   Chronic lower extremity pain (Fourth Area of Pain) (Bilateral) (R>L) 09/07/2018   Chronic musculoskeletal pain 10/27/2018   Chronic neck pain (Bilateral) w/ history of cervical spinal surgery 09/27/2018   Chronic neck pain (Primary Area of Pain) (Bilateral) (L>R) 09/07/2018   Chronic sacroiliac joint dysfunction (Bilateral) 09/27/2018   Chronic sacroiliac joint pain (Right) 09/07/2018   Chronic upper extremity pain (Secondary Area of Pain) (Bilateral) (R>L) 09/27/2018   Disorder of skeletal system 09/07/2018   E. coli UTI (urinary tract infection) 06/20/2020   Elevated sed rate 09/08/2018   Elevated serum creatinine 06/20/2020   Excessive daytime sleepiness 03/06/2020   Female pelvic pain 09/05/2020   GERD (gastroesophageal reflux disease)    Headache    History of allergy  to shellfish 09/28/2018   History of fusion of cervical spine (ACDF C4-C7) 09/27/2018   History of illicit drug use 03/07/2020   Hives 09/05/2020   Hives 09/05/2020   Hyperkalemia 05/31/2018   Mild   Hyperlipidemia 04/30/2022   Hypertension    Large breasts 05/30/2020   Left hip pain 07/21/2022   Lumbar facet arthropathy 04/20/2019   Lumbar radiculitis (L5 dermatome) (Right) 04/17/2020   Multiple environmental allergies 01/17/2020   Multiple food allergies 03/07/2020   Neurogenic pain 10/27/2018   Numbness and tingling of both feet 05/31/2018   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   Other intervertebral disc degeneration, lumbar region  11/15/2019   Pharmacologic therapy 09/07/2018   Plantar fasciitis, bilateral 03/04/2021   Polyneuropathy 01/29/2021   Prediabetes 10/10/2019   Problems influencing health status 09/07/2018   Rash 08/08/2019   Seasonal allergic rhinitis due to pollen 11/29/2018   Seasonal allergies 03/06/2020   Seasonal asthma 11/29/2018   Sjogren's disease    Sleep apnea    Somatic dysfunction of sacroiliac joint (Bilateral) 09/27/2018   Spondylosis without myelopathy or radiculopathy, cervical region 03/22/2019   Spondylosis without myelopathy or radiculopathy, lumbosacral region 05/19/2019   Spondylosis, cervical, w/ myelopathy 09/27/2018   URI with cough and congestion 06/16/2024   Vaginal discharge 06/11/2020    Past Surgical History:  Procedure Laterality Date   ABDOMINAL HYSTERECTOMY  10/17/2020   [facility]; she has both ovaries   ANTERIOR CERVICAL DECOMP/DISCECTOMY FUSION N/A 07/01/2017   Procedure: ANTERIOR CERVICAL DECOMPRESSION/DISCECTOMY FUSION 3 LEVELS;  Surgeon: Clois Fret, MD;  Location: [facility] ORS;  Service: Neurosurgery;  Laterality: N/A;   CARPAL TUNNEL RELEASE Bilateral    CHOLECYSTECTOMY     KNEE ARTHROSCOPY Right    TUBAL LIGATION  03/10/96    Family Psychiatric History: I have reviewed family psychiatric history from progress note on 01/18/2024.  Family History:  Family History  Problem Relation Age of Onset   [name] Mother    Diabetes Mother    Arthritis Mother    Hypertension Mother  Hyperlipidemia Mother    Hypertension Father    Arthritis Father    Hearing loss Father    Kidney disease Father    Lymphoma Father        large b cell    Arthritis Sister    Hypertension Sister    Cancer Brother        ? lung due to exposure    COPD Brother    Early death Brother    Post-traumatic stress disorder Brother    Breast cancer Maternal Aunt    Breast cancer Paternal Aunt    Sleep apnea Daughter    COPD Son    Depression Son    Post-traumatic stress disorder  Son    Allergic rhinitis Neg Hx    Angioedema Neg Hx    Eczema Neg Hx    Urticaria Neg Hx    Asthma Neg Hx     Social History: I have reviewed social history from progress note on 01/18/2024. Social History   Socioeconomic History   Marital status: Widowed    Spouse name: Not on file   Number of children: 2   Years of education: Not on file   Highest education level: Associate degree: occupational, scientist, product/process development, or vocational program  Occupational History   Not on file  Tobacco Use   Smoking status: Never   Smokeless tobacco: Never  Vaping Use   Vaping status: Never Used  Substance and Sexual Activity   Alcohol use: Yes    Alcohol/week: 2.0 standard drinks of alcohol    Types: 2 Glasses of wine per week    Comment: occ   Drug use: No   Sexual activity: Yes    Birth control/protection: None  Other Topics Concern   Not on file  Social History Narrative   Bus driver    And school custodian    1 daughter lives in [place] 77 y.o as of 01/17/20   1 son 24 almost 57 in [place]   Qpjwrz [employer]old [employer]ounds    Social Drivers of Health   Tobacco Use: Low Risk (08/18/2024)   Patient History    Smoking Tobacco Use: Never    Smokeless Tobacco Use: Never    Passive Exposure: Not on file  Financial Resource Strain: High Risk (06/15/2024)   Overall Financial Resource Strain (CARDIA)    Difficulty of Paying Living Expenses: Very hard  Food Insecurity: Food Insecurity Present (06/15/2024)   Epic    Worried About Programme Researcher, Broadcasting/film/video in the Last Year: Often true    Ran Out of Food in the Last Year: Often true  Transportation Needs: No Transportation Needs (06/15/2024)   Epic    Lack of Transportation (Medical): No    Lack of Transportation (Non-Medical): No  Physical Activity: Inactive (06/15/2024)   Exercise Vital Sign    Days of Exercise per Week: 0 days    Minutes of Exercise per Session: Not on file  Stress: Stress Concern Present (06/15/2024)   [employer] of  Occupational Health - Occupational Stress Questionnaire    Feeling of Stress: Rather much  Social Connections: Socially Isolated (06/15/2024)   Social Connection and Isolation Panel    Frequency of Communication with Friends and Family: Twice a week    Frequency of Social Gatherings with Friends and Family: Never    Attends Religious Services: More than 4 times per year    Active Member of [employer] or Organizations: No    Attends Banker Meetings: Not  on file    Marital Status: Widowed  Depression (PHQ2-9): Low Risk (07/14/2024)   Depression (PHQ2-9)    PHQ-2 Score: 0  Recent Concern: Depression (PHQ2-9) - High Risk (06/16/2024)   Depression (PHQ2-9)    PHQ-2 Score: 15  Alcohol Screen: Low Risk (06/15/2024)   Alcohol Screen    Last Alcohol Screening Score (AUDIT): 2  Housing: Low Risk (06/15/2024)   Epic    Unable to Pay for Housing in the Last Year: No    Number of Times Moved in the Last Year: 0    Homeless in the Last Year: No  Utilities: At Risk (06/07/2024)   Received from [facility]   Epic    In the past 12 months has the electric, gas, oil, or water company threatened to shut off services in your home?: Yes  Health Literacy: Adequate Health Literacy (09/28/2023)   Received from [facility]   B1300 Health Literacy    Frequency of need for help with medical instructions: Never    Allergies: Allergies[1]  Metabolic Disorder Labs: Lab Results  Component Value Date   HGBA1C 5.9 03/16/2024   No results found for: PROLACTIN Lab Results  Component Value Date   CHOL 246 (H) 04/08/2023   TRIG 113.0 04/08/2023   HDL 64.60 04/08/2023   CHOLHDL 4 04/08/2023   VLDL 22.6 04/08/2023   LDLCALC 159 (H) 04/08/2023   LDLCALC 108 (H) 08/29/2021   Lab Results  Component Value Date   TSH 1.43 04/08/2023   TSH 2.77 01/24/2021    Therapeutic Level Labs: No results found for: LITHIUM No results found for: VALPROATE No  results found for: CBMZ  Current Medications: Current Outpatient Medications  Medication Sig Dispense Refill   albuterol  (VENTOLIN  HFA) 108 (90 Base) MCG/ACT inhaler Inhale 2 puffs into the lungs every 6 (six) hours as needed for wheezing or shortness of breath. 8 g 11   amLODipine  (NORVASC ) 5 MG tablet Take 1 tablet (5 mg total) by mouth daily. (Patient taking differently: Take 5 mg by mouth daily. Pt stated that she is taking 10 mg instead of 5 mg) 90 tablet 0   aspirin 81 MG chewable tablet Chew 81 mg by mouth daily.     atorvastatin  (LIPITOR) 10 MG tablet Take 1 tablet by mouth on Monday, Wednesday and Friday. 90 tablet 3   atorvastatin  (LIPITOR) 80 MG tablet Take 80 mg by mouth daily.     busPIRone  (BUSPAR ) 10 MG tablet Take 2 tablets (20 mg total) by mouth 2 (two) times daily.     butalbital -acetaminophen -caffeine  (FIORICET) 50-325-40 MG tablet Take 1 tablet by mouth every 6 (six) hours as needed for headache. 30 tablet 0   cetirizine  (ZYRTEC  ALLERGY ) 10 MG tablet Take 1 tablet (10 mg total) by mouth at bedtime as needed. 90 tablet 3   Cholecalciferol (VITAMIN D -3) 125 MCG (5000 UT) TABS Take by mouth daily.     Cyanocobalamin  (B-12 PO) Take by mouth.     dicyclomine  (BENTYL ) 20 MG tablet TAKE 1 TABLET BY MOUTH FOUR TIMES DAILY (BEFORE MEALS AND AT BEDTIME) 120 tablet 5   DULoxetine  (CYMBALTA ) 60 MG capsule Take 1 capsule (60 mg total) by mouth 2 (two) times daily. 60 capsule 2   ENBREL SURECLICK 50 MG/ML injection Inject into the skin.     EPINEPHrine  (AUVI-Q ) 0.3 mg/0.3 mL IJ SOAJ injection Use as directed for severe allergic reaction 2 Device 2   famotidine  (PEPCID ) 20 MG tablet Take 1  tablet (20 mg total) by mouth 2 (two) times daily. 60 tablet 0   fluconazole  (DIFLUCAN ) 150 MG tablet Take 1 tablet (150 mg total) by mouth once a week. 2 tablet 0   fluticasone  (FLONASE ) 50 MCG/ACT nasal spray Place 2 sprays into both nostrils daily. 16 g 6   hydrochlorothiazide  (HYDRODIURIL ) 25 MG  tablet      hydrocortisone  2.5 % cream Apply topically 2 (two) times daily. Prn to eyelids 30 g 0   hydrOXYzine  (VISTARIL ) 25 MG capsule Take 1 capsule (25 mg total) by mouth 3 (three) times daily. 90 capsule 2   isosorbide  mononitrate (IMDUR ) 30 MG 24 hr tablet Take 0.5 tablets (15 mg total) by mouth daily. 60 tablet 3   losartan  (COZAAR ) 100 MG tablet Take 1 tablet (100 mg total) by mouth daily. 90 tablet 3   Melatonin 10 MG CAPS Take 10 mg by mouth at bedtime. 30 capsule 6   methocarbamol  (ROBAXIN ) 750 MG tablet Take 1 tablet (750 mg total) by mouth every 8 (eight) hours as needed for muscle spasms. 90 tablet 11   montelukast  (SINGULAIR ) 10 MG tablet Take 1 tablet (10 mg total) by mouth at bedtime. 90 tablet 3   ondansetron  (ZOFRAN -ODT) 4 MG disintegrating tablet Take 1 tablet (4 mg total) by mouth every 8 (eight) hours as needed for nausea or vomiting. 20 tablet 0   pantoprazole  (PROTONIX ) 40 MG tablet TAKE ONE TABLET EVERY DAY 30 MIN BEFORE DINNER 90 tablet 3   polyethylene glycol powder (GLYCOLAX /MIRALAX ) 17 GM/SCOOP powder Take 17 g by mouth 2 (two) times daily as needed. 3350 g 1   potassium chloride  SA (KLOR-CON  M) 20 MEQ tablet Take 1 tablet (20 mEq total) by mouth daily as needed. With lasix  30 tablet 0   predniSONE  (DELTASONE ) 20 MG tablet Take 2 tablets (40 mg total) by mouth daily. 10 tablet 0   pregabalin  (LYRICA ) 100 MG capsule Take 1 capsule (100 mg total) by mouth 3 (three) times daily. 90 capsule 2   saccharomyces boulardii (FLORASTOR) 250 MG capsule Take 1 capsule (250 mg total) by mouth daily. 90 capsule 0   tirzepatide  (ZEPBOUND ) 2.5 MG/0.5ML Pen Inject 2.5 mg into the skin once a week. 6 mL 0   traMADol  (ULTRAM -ER) 100 MG 24 hr tablet Take 1 tablet (100 mg total) by mouth daily. 30 tablet 2   traZODone  (DESYREL ) 100 MG tablet Take 1 tablet (100 mg total) by mouth at bedtime. 30 tablet 3   traZODone  (DESYREL ) 50 MG tablet Take 1-2 tablets (50-100 mg total) by mouth at bedtime  as needed for sleep. 60 tablet 1   triamcinolone  cream (KENALOG ) 0.1 % Apply 1 Application topically 2 (two) times daily. 30 g 0   No current facility-administered medications for this visit.     Musculoskeletal: Strength & Muscle Tone: within normal limits Gait & Station: normal Patient leans: N/A  Psychiatric Specialty Exam: Review of Systems  Psychiatric/Behavioral:  Positive for dysphoric mood and sleep disturbance.     Blood pressure 122/72, pulse 86, temperature (!) 97.4 F (36.3 C), temperature source Temporal, height 5' 3 (1.6 m), weight 236 lb 3.2 oz (107.1 kg).Body mass index is 41.84 kg/m.  General Appearance: Casual  Eye Contact:  Fair  Speech:  Clear and Coherent  Volume:  Normal  Mood:  Depressed  Affect:  Congruent  Thought Process:  Goal Directed and Descriptions of Associations: Intact  Orientation:  Full (Time, Place, and Person)  Thought Content: Logical  Suicidal Thoughts:  No  Homicidal Thoughts:  No  Memory:  Immediate;   Fair Recent;   Fair Remote;   Fair  Judgement:  Fair  Insight:  Fair  Psychomotor Activity:  Normal  Concentration:  Concentration: Fair and Attention Span: Fair  Recall:  [employer] of Knowledge: Fair  Language: Fair  Akathisia:  No  Handed:  Left  AIMS (if indicated): not done  Assets:  Communication Skills Desire for Improvement Housing Social Support  ADL's:  Intact  Cognition: WNL  Sleep:  varies   Screenings: GAD-7    Garment/textile Technologist Visit from 06/16/2024 in [facility] Visit from 06/08/2024 in [facility] Visit from 01/18/2024 in [facility] Visit from 01/05/2024 in [facility] Visit from 11/24/2023 in [facility]  Total GAD-7 Score 6 8 19 19 21    PHQ2-9    Flowsheet Row Office  Visit from 07/14/2024 in [facility] Visit from 07/05/2024 in [facility] Visit from 06/16/2024 in [facility] Visit from 06/08/2024 in [facility] Visit from 05/05/2024 in [facility] Total Score 0 0 4 2 3   PHQ-9 Total Score -- 0 15 11 --   Flowsheet Row ED from 05/31/2024 in [facility] Video Visit from 05/25/2024 in [facility] Video Visit from 03/31/2024 in [facility]  C-SSRS RISK CATEGORY No Risk Moderate Risk No Risk     Assessment and Plan: Anita Gibbs is a 53 year old African-American female who presented for a follow-up appointment, discussed assessment and plan as noted below.  1. Recurrent major depressive disorder, in full remission Manage denies any significant depression symptoms Continue BuSpar  20 mg twice daily Continue Duloxetine  60 mg twice daily Continue psychotherapy sessions with Ms. [name]  2. Generalized anxiety disorder-improving Reports anxiety symptoms is improving. Continue BuSpar  as prescribed Continue Duloxetine  as prescribed Continue Hydroxyzine  25 mg 3 times a day as needed.  3. Insomnia due to medical condition-unstable Currently reports sleep problems due to CPAP issues. Continue Trazodone  50-100 mg at bedtime as needed. Encouraged to follow-up with provider who is managing CPAP regarding concerns.  Follow-up Follow-up in clinic in 3 months or sooner if needed.      Collaboration of Care: Collaboration of Care: Referral or follow-up with counselor/therapist AEB encouraged to continue psychotherapy sessions.  Patient/Guardian was advised  Release of Information must be obtained prior to any record release in order to collaborate their care with an outside provider. Patient/Guardian was advised if they have not already done so to contact the registration department to sign all necessary forms in order for us  to release information regarding their care.   Consent: Patient/Guardian gives verbal consent for treatment and assignment of benefits for services provided during this visit. Patient/Guardian expressed understanding and agreed to proceed.  This note was generated in part or whole with voice recognition software. Voice recognition is usually quite accurate but there are transcription errors that can and very often do occur. I apologize for any typographical errors that were not detected and corrected.     Tecumseh Yeagley, MD 08/18/2024,  10:48 AM     [1]  Allergies Allergen Reactions   Other Hives    White and red sauces   Pineapple     Swelling  Other Reaction(s): Not available  pineapple allergenic extract   Pineapple Extract Swelling    pineapple extract   Shellfish Allergy  Hives   Strawberry (Diagnostic) Hives   Tomato Hives    cherry  Solanum (organism)

## 2024-08-19 ENCOUNTER — Ambulatory Visit (INDEPENDENT_AMBULATORY_CARE_PROVIDER_SITE_OTHER): Payer: MEDICAID | Admitting: Clinical

## 2024-08-19 DIAGNOSIS — F410 Panic disorder [episodic paroxysmal anxiety] without agoraphobia: Secondary | ICD-10-CM

## 2024-08-19 DIAGNOSIS — F32A Depression, unspecified: Secondary | ICD-10-CM | POA: Diagnosis not present

## 2024-08-19 DIAGNOSIS — F411 Generalized anxiety disorder: Secondary | ICD-10-CM

## 2024-08-19 NOTE — Progress Notes (Signed)
 "  [facility] Counselor/Therapist Progress Note  Patient ID: Anita Gibbs, MRN: [id number],    Date: 08/19/2024  Time Spent: 8:32am - 9:24am : 52 minutes   Treatment Type: Individual Therapy  Reported Symptoms: anger  Mental Status Exam: Appearance:  Casual     Behavior: Appropriate  Motor: Normal  Speech/Language:  Clear and Coherent and Normal Rate  Affect: Appropriate  Mood: Patient reported feeling relaxed today  Thought process: normal  Thought content:   WNL  Sensory/Perceptual disturbances:   WNL  Orientation: oriented to person, place, time/date, and situation  Attention: Good  Concentration: Good  Memory: WNL  Fund of knowledge:  Good  Insight:   Good  Judgment:  Good  Impulse Control: Good   Risk Assessment: Danger to Self:  No Patient denied current suicidal ideation  Self-injurious Behavior: No Danger to Others: No Patient denied current homicidal ideation Duty to Warn:no Physical Aggression / Violence:No  Access to Firearms a concern: No  Gang Involvement:No   Subjective: Patient stated, I had my gallbladder removed so some of the pain has went away from that, they finally approved my disability so that's a plus. Patient reported patient does not currently have heat/gas due to billing associated with patient's father's account. Patient reported patient has contacted the [employer], social services, and [employer] for utility assistance. Patient reported patient is currently receiving physical therapy to treat neck pain and reported feeling pain has increased. Patient reported excruciating pain. Patient reported experiencing migraines and reported patient is scheduled for surgery in January to remove an ovarian cyst. Patient stated, Its one thing after another really. Patient reported diagnosis of fibromyalgia, degenerative disc disease, fascia disease, Sjogren syndrome. Patient stated,  I think mentally I'm fine because I'm getting a little  progress, me just feeling better (physically) is a plus for me. Patient stated, I'm just relaxed today, I'm trying not to think about anything today. Patient stated, its draining in response to seeking resources for utilities. Patient reported conflict with sister and stated, cause she wont leave me alone. Patient reported sister has been saying negative things about me. Patient reported feeling anger and disappointment in response to conflict with sister. Patient reported previous thoughts of wanting to punch sister and stated, I'm not but the thought has crossed. Patient denied plan or intent to follow through with previous thoughts and stated, I don't want to block my blessing for doing something negative to her. Patient stated, I'm good with it now, id just rather her leave me alone in reference to conflict with sister. Patient reported limiting conversations with sister and stated, it helps. Patient stated, I have to worry about me and my health. Patient stated, Id say its more positives than negatives in reference to events since last session.   Interventions: Cognitive Behavioral Therapy and supportive therapy. Clinician conducted session via caregility video from clinician's home office. Patient provided verbal consent to proceed with telehealth session and is aware of limitations of telephone or video visits. Patient participated in session from patient's home. Reviewed events since last session and assessed for changes. Provided supportive therapy and active listening as patient dicussed recent changes in patient's health, current stressors, and patient's response. Explored and identified additional resources for utility assistance and provided information to patient. Discussed conflict with sister. Explored and identified thoughts and feelings triggered by conflict with sister. Discussed strategies patient has implemented in response to conflict with sister.   Collaboration  of Care: not required at this  time   Diagnosis:  Panic disorder   Generalized anxiety disorder   Depression, unspecified depression type     Plan: Patient is to utilize [employer] [facility], thought re-framing, behavioral activation, relaxation techniques, mindfulness and coping strategies to decrease symptoms associated with their diagnosis. Frequency: bi-weekly  Modality: individual      Long-term goal:   Reduce overall level, frequency, and intensity of the feelings of depression, anxiety and panic as evidenced by decreased panic attacks,fear of future panic attacks, lack of  concentration, difficulty falling asleep and staying asleep, muscle tension, feeling on edge, restlessness, and irritability from 7 days/week to 1 to 3 days/week per patient report for at least 3 consecutive months. Target Date: 12/03/24  Progress: progressing    Short-term goal:  Identify and access local resources to increase patient's financial stability Target Date: 06/04/24 - extended to follow up appointment on 09/05/24 due to current clinical needs  Progress: progressing    Decrease feelings of panic/anxiety from 4 to 5 days per week to 1 to 2 days per week by implementing healthy coping strategies, such as, relaxation techniques and mindfulness exercises Target Date: 06/04/24- extended to follow up appointment on 09/05/24 due to current clinical needs  Progress: progressing    Identify, challenge, and replace negative thought patterns and negative self talk that contribute to feelings of depression and anxiety with positive thoughts, beliefs, and positive self talk per patient's report Target Date: 06/04/24- extended to follow up appointment on 09/05/24 due to current clinical needs  Progress: progressing    Increase patient's participation in activities patient enjoys from 1 time per week to 3 times per week  Target Date: 06/04/24- extended to follow up appointment on 09/05/24 due to current clinical needs   Progress: progressing    Darice Seats, LCSW    "

## 2024-08-19 NOTE — Progress Notes (Signed)
   Anita Seats, LCSW

## 2024-08-22 ENCOUNTER — Ambulatory Visit: Payer: MEDICAID

## 2024-08-22 ENCOUNTER — Encounter: Payer: Self-pay | Admitting: Family Medicine

## 2024-08-22 ENCOUNTER — Ambulatory Visit: Payer: Self-pay

## 2024-08-22 ENCOUNTER — Ambulatory Visit (INDEPENDENT_AMBULATORY_CARE_PROVIDER_SITE_OTHER): Payer: MEDICAID | Admitting: Family Medicine

## 2024-08-22 VITALS — BP 122/78 | HR 54 | Temp 98.7°F | Ht 63.0 in | Wt 239.2 lb

## 2024-08-22 DIAGNOSIS — H6501 Acute serous otitis media, right ear: Secondary | ICD-10-CM | POA: Diagnosis not present

## 2024-08-22 DIAGNOSIS — M6281 Muscle weakness (generalized): Secondary | ICD-10-CM

## 2024-08-22 DIAGNOSIS — M542 Cervicalgia: Secondary | ICD-10-CM

## 2024-08-22 DIAGNOSIS — H6590 Unspecified nonsuppurative otitis media, unspecified ear: Secondary | ICD-10-CM | POA: Insufficient documentation

## 2024-08-22 DIAGNOSIS — M5412 Radiculopathy, cervical region: Secondary | ICD-10-CM

## 2024-08-22 MED ORDER — AMOXICILLIN 500 MG PO CAPS: 500.0000 mg | ORAL_CAPSULE | Freq: Two times a day (BID) | ORAL | 0 refills | Status: DC

## 2024-08-22 MED ORDER — PREDNISONE 20 MG PO TABS: 20.0000 mg | ORAL_TABLET | Freq: Every day | ORAL | 0 refills | Status: DC

## 2024-08-22 NOTE — Telephone Encounter (Signed)
 FYI Only or Action Required?: FYI only for provider: appointment scheduled on 08/22/24.  Patient was last seen in primary care on 07/05/2024 by Douglass Kenney NOVAK, FNP.  Called Nurse Triage reporting Otalgia.  Symptoms began 1 day ago.  Interventions attempted: Nothing.  Symptoms are: stable.  Triage Disposition: See Physician Within 24 Hours  Patient/caregiver understands and will follow disposition?: yes    Copied from CRM #[id number]. Topic: Clinical - Red Word Triage >> Aug 22, 2024  9:41 AM Revonda D wrote: Red Word that prompted transfer to Nurse Triage: sharp pain   Pt stated that she is experiencing a sharp pain in her right ear and is unable to hear out the right ear. Pt would like to schedule an appt with the provider. Reason for Disposition  Earache  (Exceptions: Brief ear pain of lasting less than 60 minutes, or earache occurring during air travel.)  Answer Assessment - Initial Assessment Questions 1. LOCATION: Which ear is involved?     right 2. ONSET: When did the ear pain start?      Last night 3. SEVERITY: How bad is the pain?  (Scale 1-10; mild, moderate or severe)     7/10 4. URI SYMPTOMS: Do you have a runny nose or cough?     Had cough last night 5. FEVER: Do you have a fever? If Yes, ask: What is your temperature, how was it measured, and when did it start?      6. CAUSE: Have you been swimming recently?, How often do you use Q-TIPS?, Have you had any recent air travel or scuba diving?      7. OTHER SYMPTOMS: Do you have any other symptoms? (e.g., decreased hearing, dizziness, headache, stiff neck, vomiting)     no 8. PREGNANCY: Is there any chance you are pregnant? When was your last menstrual period?  Protocols used: [place]

## 2024-08-22 NOTE — Assessment & Plan Note (Signed)
 With effusion causing right ear pain and decreased hearing   Will treat with  Amox 500 mg bid 7 d Prednisone  20 mg daily 5 d  Update if not starting to improve in a week or if worsening  Call back and Er precautions noted in detail today   Handout given   Will also continue to use her flonase /allergy  medication

## 2024-08-22 NOTE — Progress Notes (Signed)
 "  Subjective:    Patient ID: Anita Gibbs, female    DOB: 11/30/1970, 53 y.o.   MRN: [id number]  HPI  Wt Readings from Last 3 Encounters:  08/22/24 239 lb 4 oz (108.5 kg)  08/05/24 239 lb 9.6 oz (108.7 kg)  08/01/24 239 lb 9.6 oz (108.7 kg)   42.38 kg/m  Vitals:   08/22/24 1448  BP: 122/78  Pulse: (!) 54  Temp: 98.7 F (37.1 C)  SpO2: 99%   53 yo pt of NP Gretel presents with right ear pain    PMHx notable for allergic rhinitis, occipital neuralgia, sjogren's   Right ear pain  Sharp pain while sitting Friday night  Then a numb feeling  Sharp pain coming and going   No drainage  Cannot hear well in that side  No tinnitus  No history of wax problems No history of ear infections   A little cough at night time No fever  Not congested (uses flonase  daily)   No facial pain   Other ear is fine   Over the counter  800 ibuprofen- for other things      Patient Active Problem List   Diagnosis Date Noted   Serous otitis media 08/22/2024   URI with cough and congestion 06/16/2024   Hypokalemia 06/09/2024   Nausea 06/08/2024   Abdominal bloating 05/05/2024   Cervical radicular pain 03/22/2024   Bilateral occipital neuralgia 03/22/2024   Sjogren syndrome with inflammatory arthritis 03/21/2024   MDD (major depressive disorder), recurrent episode, severe (HCC) 03/16/2024   Insomnia 02/25/2024   MDD (major depressive disorder), recurrent episode, moderate (HCC) 01/18/2024   OSA (obstructive sleep apnea) 01/18/2024   Seronegative spondyloarthropathy 01/05/2024   Episodic tension-type headache, not intractable 01/05/2024   Palpitations 11/24/2023   Wasp sting 11/24/2023   AKI (acute kidney injury) 11/02/2023   Other chest pain 10/29/2023   GAD (generalized anxiety disorder) 09/30/2023   Lower abdominal pain 07/22/2023   Abnormal glucose 04/24/2023   Mood disorder 04/24/2023   Annual physical exam 04/24/2023   Anemia 04/24/2023   Chronic frontal sinusitis  10/08/2022   Abdominal pain, left lower quadrant 06/30/2022   Morbid obesity with BMI of 40.0-44.9, adult (HCC) 04/30/2022   Allergic rhinitis 03/04/2021   Bilateral leg edema 01/29/2021   Lumbosacral radiculopathy at L5 01/29/2021   Neuropathy 01/29/2021   Vaginal discharge 06/11/2020   Bacterial vaginosis 06/11/2020   Pain medication agreement broken 03/07/2020   Prediabetes 10/10/2019   HLD (hyperlipidemia) 10/10/2019   Gastroesophageal reflux disease 09/06/2019   Fibroid uterus 09/06/2019   Seasonal asthma 11/29/2018   DDD (degenerative disc disease), cervical 09/27/2018   Cervical spondylosis 09/27/2018   DDD (degenerative disc disease), lumbar 09/27/2018   Vitamin D  deficiency 09/27/2018   Chronic pain syndrome 09/07/2018   Essential hypertension 05/31/2018   Past Medical History:  Diagnosis Date   Abdominal pain 06/11/2020   Abnormal drug screen 03/06/2020   Abnormal MRI, cervical spine (06/10/2018) 09/27/2018   FINDINGS:  Vertebrae: fusion hardware at C4, C5, C6, and C7.  Posterior Fossa, vertebral arteries, paraspinal tissues: A relatively empty sella present.     Disc levels:  C3-4: Negative. A mild broad-based disc osteophyte complex present. Uncovertebral spurring contributes to mild foraminal narrowing bilaterally.  C4-5: A leftward disc osteophyte complex is present. Uncovertebral and facet disease   Acute cystitis without hematuria 06/18/2020   Acute vaginitis 06/11/2020   Adenoma of left adrenal gland 08/01/2020   Adrenal nodule 06/20/2020   Allergy   ANA positive 02/01/2021   Anxiety and depression 01/29/2021   Arthritis    Back pain with history of spinal surgery 01/29/2021   Bilateral carpal tunnel syndrome 01/29/2021   Bilateral leg edema 01/29/2021   BMI 40.0-44.9, adult (HCC) 04/30/2022   Carpal boss, right    Carpal tunnel syndrome, bilateral    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) 09/27/2018   Levels:  C4-5: There is partial effacement of ventral  CSF.     IMPRESSION:  Mild residual central canal narrowing at C4-5 s/p ACDF.   Cervical facet hypertrophy (C3-T1) 09/27/2018   Levels:  C3-4: Uncovertebral spurring  C4-5: Uncovertebral and facet disease  C5-6: Residual uncovertebral disease.  C6-7: Residual uncovertebral disease.  C7-T1: Minimal left-sided uncovertebral spurring   Cervical facet joint syndrome (Bilateral) (L>R) 09/27/2018   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-5, C5-6, and C6-7) 09/27/2018   Levels:  C3-4: Mild foraminal narrowing bilaterally.  C4-5: Mild left foraminal narrowing.  C5-6: Mild left foraminal narrowing is due to residual uncovertebral disease.  C6-7: Mild left foraminal narrowing is due to residual uncovertebral disease.   Cervical myelopathy (HCC) 07/01/2017   Cervicalgia (Primary Area of Pain) (Bilateral) (L>R) 09/27/2018   Chronic gout of foot (Left) 05/31/2018   Chronic low back pain [facility] Area of Pain) (Bilateral) (R>L) w/ sciatica (Bilateral) 09/07/2018   Chronic low back pain [facility] Area of Pain) (Bilateral) (R>L) w/o sciatica 07/26/2018   Chronic lower extremity pain (Fourth Area of Pain) (Bilateral) (R>L) 09/07/2018   Chronic musculoskeletal pain 10/27/2018   Chronic neck pain (Bilateral) w/ history of cervical spinal surgery 09/27/2018   Chronic neck pain (Primary Area of Pain) (Bilateral) (L>R) 09/07/2018   Chronic sacroiliac joint dysfunction (Bilateral) 09/27/2018   Chronic sacroiliac joint pain (Right) 09/07/2018   Chronic upper extremity pain (Secondary Area of Pain) (Bilateral) (R>L) 09/27/2018   Disorder of skeletal system 09/07/2018   E. coli UTI (urinary tract infection) 06/20/2020   Elevated sed rate 09/08/2018   Elevated serum creatinine 06/20/2020   Excessive daytime sleepiness 03/06/2020   Female pelvic pain 09/05/2020   GERD (gastroesophageal reflux disease)    Headache    History of allergy  to shellfish 09/28/2018   History of fusion of cervical spine  (ACDF C4-C7) 09/27/2018   History of illicit drug use 03/07/2020   Hives 09/05/2020   Hives 09/05/2020   Hyperkalemia 05/31/2018   Mild   Hyperlipidemia 04/30/2022   Hypertension    Large breasts 05/30/2020   Left hip pain 07/21/2022   Lumbar facet arthropathy 04/20/2019   Lumbar radiculitis (L5 dermatome) (Right) 04/17/2020   Multiple environmental allergies 01/17/2020   Multiple food allergies 03/07/2020   Neurogenic pain 10/27/2018   Numbness and tingling of both feet 05/31/2018   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   Other intervertebral disc degeneration, lumbar region 11/15/2019   Pharmacologic therapy 09/07/2018   Plantar fasciitis, bilateral 03/04/2021   Polyneuropathy 01/29/2021   Prediabetes 10/10/2019   Problems influencing health status 09/07/2018   Rash 08/08/2019   Seasonal allergic rhinitis due to pollen 11/29/2018   Seasonal allergies 03/06/2020   Seasonal asthma 11/29/2018   Sjogren's disease    Sleep apnea    Somatic dysfunction of sacroiliac joint (Bilateral) 09/27/2018   Spondylosis without myelopathy or radiculopathy, cervical region 03/22/2019   Spondylosis without myelopathy or radiculopathy, lumbosacral region 05/19/2019   Spondylosis, cervical, w/ myelopathy 09/27/2018   URI with cough and congestion 06/16/2024   Vaginal discharge 06/11/2020   Past Surgical History:  Procedure Laterality Date   ABDOMINAL HYSTERECTOMY  10/17/2020   [facility]; she has both ovaries   ANTERIOR CERVICAL DECOMP/DISCECTOMY FUSION N/A 07/01/2017   Procedure: ANTERIOR CERVICAL DECOMPRESSION/DISCECTOMY FUSION 3 LEVELS;  Surgeon: Clois Fret, MD;  Location: [facility] ORS;  Service: Neurosurgery;  Laterality: N/A;   CARDIAC CATHETERIZATION     CARPAL TUNNEL RELEASE Bilateral    CHOLECYSTECTOMY     KNEE ARTHROSCOPY Right    TUBAL LIGATION  03/10/96   Social History[1] Family History  Problem Relation Age of Onset   [name] Mother    Diabetes Mother     Arthritis Mother    Hypertension Mother    Hyperlipidemia Mother    Hypertension Father    Arthritis Father    Hearing loss Father    Kidney disease Father    Lymphoma Father        large b cell    Arthritis Sister    Hypertension Sister    Cancer Brother        ? lung due to exposure    COPD Brother    Early death Brother    Post-traumatic stress disorder Brother    Breast cancer Maternal Aunt    Breast cancer Paternal Aunt    Sleep apnea Daughter    COPD Son    Depression Son    Post-traumatic stress disorder Son    Allergic rhinitis Neg Hx    Angioedema Neg Hx    Eczema Neg Hx    Urticaria Neg Hx    Asthma Neg Hx    Allergies[2] Medications Ordered Prior to Encounter[3]  Review of Systems  Constitutional:  Negative for fatigue and fever.  HENT:  Positive for ear pain, hearing loss and postnasal drip. Negative for congestion, dental problem, ear discharge, facial swelling and sore throat.   Respiratory:  Positive for cough.   Musculoskeletal:  Positive for arthralgias.       Feels like her arthritis is flaring recently       Objective:   Physical Exam Constitutional:      General: She is not in acute distress.    Appearance: Normal appearance. She is obese. She is not ill-appearing or diaphoretic.  HENT:     Head: Normocephalic and atraumatic.     Comments: No facial swelling or tenderness    Right Ear: External ear normal. There is no impacted cerumen.     Left Ear: Tympanic membrane, ear canal and external ear normal. There is no impacted cerumen.     Ears:     Comments: Right TM- large effusion with injection  Clear canal  No tenderness    Nose:     Comments: Boggy nares     Mouth/Throat:     Mouth: Mucous membranes are moist.     Pharynx: Oropharynx is clear. No oropharyngeal exudate or posterior oropharyngeal erythema.  Eyes:     Conjunctiva/sclera: Conjunctivae normal.     Pupils: Pupils are equal, round, and reactive to light.  Cardiovascular:      Rate and Rhythm: Regular rhythm. Bradycardia present.  Pulmonary:     Effort: Pulmonary effort is normal. No respiratory distress.     Breath sounds: Normal breath sounds. No wheezing or rales.  Musculoskeletal:     Cervical back: No tenderness.  Lymphadenopathy:     Cervical: No cervical adenopathy.  Skin:    General: Skin is warm and dry.     Findings: No erythema or rash.  Neurological:     Mental  Status: She is alert.     Cranial Nerves: No cranial nerve deficit.  Psychiatric:        Mood and Affect: Mood normal.           Assessment & Plan:   Problem List Items Addressed This Visit       Nervous and Auditory   Serous otitis media - Primary   With effusion causing right ear pain and decreased hearing   Will treat with  Amox 500 mg bid 7 d Prednisone  20 mg daily 5 d  Update if not starting to improve in a week or if worsening  Call back and Er precautions noted in detail today   Handout given   Will also continue to use her flonase /allergy  medication       Relevant Medications   amoxicillin  (AMOXIL ) 500 MG capsule      [1]  Social History Tobacco Use   Smoking status: Never   Smokeless tobacco: Never  Vaping Use   Vaping status: Never Used  Substance Use Topics   Alcohol use: Yes    Alcohol/week: 2.0 standard drinks of alcohol    Types: 2 Glasses of wine per week    Comment: occ   Drug use: No  [2]  Allergies Allergen Reactions   Other Hives    White and red sauces   Pineapple     Swelling  Other Reaction(s): Not available  pineapple allergenic extract   Pineapple Extract Swelling    pineapple extract   Shellfish Allergy  Hives   Strawberry (Diagnostic) Hives   Tomato Hives    cherry  Solanum (organism)  [3]  Current Outpatient Medications on File Prior to Visit  Medication Sig Dispense Refill   albuterol  (VENTOLIN  HFA) 108 (90 Base) MCG/ACT inhaler Inhale 2 puffs into the lungs every 6 (six) hours as needed for wheezing or  shortness of breath. 8 g 11   amLODipine  (NORVASC ) 5 MG tablet Take 1 tablet (5 mg total) by mouth daily. (Patient taking differently: Take 5 mg by mouth daily. Pt stated that she is taking 10 mg instead of 5 mg) 90 tablet 0   aspirin 81 MG chewable tablet Chew 81 mg by mouth daily.     atorvastatin  (LIPITOR) 80 MG tablet Take 80 mg by mouth daily.     busPIRone  (BUSPAR ) 10 MG tablet Take 2 tablets (20 mg total) by mouth 2 (two) times daily.     butalbital -acetaminophen -caffeine  (FIORICET) 50-325-40 MG tablet Take 1 tablet by mouth every 6 (six) hours as needed for headache. 30 tablet 0   cetirizine  (ZYRTEC  ALLERGY ) 10 MG tablet Take 1 tablet (10 mg total) by mouth at bedtime as needed. 90 tablet 3   Cholecalciferol (VITAMIN D -3) 125 MCG (5000 UT) TABS Take by mouth daily.     Cyanocobalamin  (B-12 PO) Take by mouth.     dicyclomine  (BENTYL ) 20 MG tablet TAKE 1 TABLET BY MOUTH FOUR TIMES DAILY (BEFORE MEALS AND AT BEDTIME) 120 tablet 5   DULoxetine  (CYMBALTA ) 60 MG capsule Take 1 capsule (60 mg total) by mouth 2 (two) times daily. 60 capsule 2   ENBREL SURECLICK 50 MG/ML injection Inject into the skin.     EPINEPHrine  (AUVI-Q ) 0.3 mg/0.3 mL IJ SOAJ injection Use as directed for severe allergic reaction 2 Device 2   famotidine  (PEPCID ) 20 MG tablet Take 1 tablet (20 mg total) by mouth 2 (two) times daily. 60 tablet 0   fluticasone  (FLONASE ) 50 MCG/ACT nasal spray Place 2  sprays into both nostrils daily. 16 g 6   hydrochlorothiazide  (HYDRODIURIL ) 25 MG tablet      hydrocortisone  2.5 % cream Apply topically 2 (two) times daily. Prn to eyelids 30 g 0   hydrOXYzine  (VISTARIL ) 25 MG capsule Take 1 capsule (25 mg total) by mouth 3 (three) times daily. 90 capsule 2   isosorbide  mononitrate (IMDUR ) 30 MG 24 hr tablet Take 0.5 tablets (15 mg total) by mouth daily. 60 tablet 3   Melatonin 10 MG CAPS Take 10 mg by mouth at bedtime. 30 capsule 6   methocarbamol  (ROBAXIN ) 750 MG tablet Take 1 tablet (750 mg  total) by mouth every 8 (eight) hours as needed for muscle spasms. 90 tablet 11   montelukast  (SINGULAIR ) 10 MG tablet Take 1 tablet (10 mg total) by mouth at bedtime. 90 tablet 3   ondansetron  (ZOFRAN -ODT) 4 MG disintegrating tablet Take 1 tablet (4 mg total) by mouth every 8 (eight) hours as needed for nausea or vomiting. 20 tablet 0   pantoprazole  (PROTONIX ) 40 MG tablet TAKE ONE TABLET EVERY DAY 30 MIN BEFORE DINNER 90 tablet 3   polyethylene glycol powder (GLYCOLAX /MIRALAX ) 17 GM/SCOOP powder Take 17 g by mouth 2 (two) times daily as needed. 3350 g 1   potassium chloride  SA (KLOR-CON  M) 20 MEQ tablet Take 1 tablet (20 mEq total) by mouth daily as needed. With lasix  30 tablet 0   predniSONE  (DELTASONE ) 20 MG tablet Take 2 tablets (40 mg total) by mouth daily. 10 tablet 0   pregabalin  (LYRICA ) 100 MG capsule Take 1 capsule (100 mg total) by mouth 3 (three) times daily. 90 capsule 2   saccharomyces boulardii (FLORASTOR) 250 MG capsule Take 1 capsule (250 mg total) by mouth daily. 90 capsule 0   tirzepatide  (ZEPBOUND ) 2.5 MG/0.5ML Pen Inject 2.5 mg into the skin once a week. 6 mL 0   traMADol  (ULTRAM -ER) 100 MG 24 hr tablet Take 1 tablet (100 mg total) by mouth daily. 30 tablet 2   traZODone  (DESYREL ) 100 MG tablet Take 1 tablet (100 mg total) by mouth at bedtime. 30 tablet 3   triamcinolone  cream (KENALOG ) 0.1 % Apply 1 Application topically 2 (two) times daily. 30 g 0   No current facility-administered medications on file prior to visit.   "

## 2024-08-22 NOTE — Patient Instructions (Addendum)
 Continue your flonase    Take the prednisone  20 mg daily for 5 days for plugged ear tube   Take the amoxicillin  as directed for ear infection    Update if not starting to improve in a week or if worsening

## 2024-08-22 NOTE — Therapy (Addendum)
 " OUTPATIENT PHYSICAL THERAPY CERVICAL TREATMENT/RECERTIFICATION   Patient Name: Anita Gibbs MRN: [id number] DOB:1970/09/22, 53 y.o., female Today's Date: 08/22/2024  END OF SESSION:  PT End of Session - 08/22/24 1732     Visit Number 8    Number of Visits 17    Date for Recertification  08/17/24    Authorization Type [employer] [id number] for 10 PT vsts from 12/4-01/31/25    Authorization Time Period 12/4-01/31/25    Authorization - Visit Number 3    Authorization - Number of Visits 10    PT Start Time 1730    PT Stop Time 1810    PT Time Calculation (min) 40 min    Activity Tolerance Patient tolerated treatment well    Behavior During Therapy [facility] for tasks assessed/performed               Past Medical History:  Diagnosis Date   Abdominal pain 06/11/2020   Abnormal drug screen 03/06/2020   Abnormal MRI, cervical spine (06/10/2018) 09/27/2018   FINDINGS:  Vertebrae: fusion hardware at C4, C5, C6, and C7.  Posterior Fossa, vertebral arteries, paraspinal tissues: A relatively empty sella present.     Disc levels:  C3-4: Negative. A mild broad-based disc osteophyte complex present. Uncovertebral spurring contributes to mild foraminal narrowing bilaterally.  C4-5: A leftward disc osteophyte complex is present. Uncovertebral and facet disease   Acute cystitis without hematuria 06/18/2020   Acute vaginitis 06/11/2020   Adenoma of left adrenal gland 08/01/2020   Adrenal nodule 06/20/2020   Allergy     ANA positive 02/01/2021   Anxiety and depression 01/29/2021   Arthritis    Back pain with history of spinal surgery 01/29/2021   Bilateral carpal tunnel syndrome 01/29/2021   Bilateral leg edema 01/29/2021   BMI 40.0-44.9, adult (HCC) 04/30/2022   Carpal boss, right    Carpal tunnel syndrome, bilateral    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) 09/27/2018   Levels:  C4-5: There is partial effacement of ventral CSF.     IMPRESSION:  Mild residual central canal narrowing at  C4-5 s/p ACDF.   Cervical facet hypertrophy (C3-T1) 09/27/2018   Levels:  C3-4: Uncovertebral spurring  C4-5: Uncovertebral and facet disease  C5-6: Residual uncovertebral disease.  C6-7: Residual uncovertebral disease.  C7-T1: Minimal left-sided uncovertebral spurring   Cervical facet joint syndrome (Bilateral) (L>R) 09/27/2018   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-5, C5-6, and C6-7) 09/27/2018   Levels:  C3-4: Mild foraminal narrowing bilaterally.  C4-5: Mild left foraminal narrowing.  C5-6: Mild left foraminal narrowing is due to residual uncovertebral disease.  C6-7: Mild left foraminal narrowing is due to residual uncovertebral disease.   Cervical myelopathy (HCC) 07/01/2017   Cervicalgia (Primary Area of Pain) (Bilateral) (L>R) 09/27/2018   Chronic gout of foot (Left) 05/31/2018   Chronic low back pain [facility] Area of Pain) (Bilateral) (R>L) w/ sciatica (Bilateral) 09/07/2018   Chronic low back pain [facility] Area of Pain) (Bilateral) (R>L) w/o sciatica 07/26/2018   Chronic lower extremity pain (Fourth Area of Pain) (Bilateral) (R>L) 09/07/2018   Chronic musculoskeletal pain 10/27/2018   Chronic neck pain (Bilateral) w/ history of cervical spinal surgery 09/27/2018   Chronic neck pain (Primary Area of Pain) (Bilateral) (L>R) 09/07/2018   Chronic sacroiliac joint dysfunction (Bilateral) 09/27/2018   Chronic sacroiliac joint pain (Right) 09/07/2018   Chronic upper extremity pain (Secondary Area of Pain) (Bilateral) (R>L) 09/27/2018   Disorder of skeletal system 09/07/2018   E. coli UTI (urinary tract infection) 06/20/2020  Elevated sed rate 09/08/2018   Elevated serum creatinine 06/20/2020   Excessive daytime sleepiness 03/06/2020   Female pelvic pain 09/05/2020   GERD (gastroesophageal reflux disease)    Headache    History of allergy  to shellfish 09/28/2018   History of fusion of cervical spine (ACDF C4-C7) 09/27/2018   History of illicit drug use 03/07/2020    Hives 09/05/2020   Hives 09/05/2020   Hyperkalemia 05/31/2018   Mild   Hyperlipidemia 04/30/2022   Hypertension    Large breasts 05/30/2020   Left hip pain 07/21/2022   Lumbar facet arthropathy 04/20/2019   Lumbar radiculitis (L5 dermatome) (Right) 04/17/2020   Multiple environmental allergies 01/17/2020   Multiple food allergies 03/07/2020   Neurogenic pain 10/27/2018   Numbness and tingling of both feet 05/31/2018   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   Other intervertebral disc degeneration, lumbar region 11/15/2019   Pharmacologic therapy 09/07/2018   Plantar fasciitis, bilateral 03/04/2021   Polyneuropathy 01/29/2021   Prediabetes 10/10/2019   Problems influencing health status 09/07/2018   Rash 08/08/2019   Seasonal allergic rhinitis due to pollen 11/29/2018   Seasonal allergies 03/06/2020   Seasonal asthma 11/29/2018   Sjogren's disease    Sleep apnea    Somatic dysfunction of sacroiliac joint (Bilateral) 09/27/2018   Spondylosis without myelopathy or radiculopathy, cervical region 03/22/2019   Spondylosis without myelopathy or radiculopathy, lumbosacral region 05/19/2019   Spondylosis, cervical, w/ myelopathy 09/27/2018   URI with cough and congestion 06/16/2024   Vaginal discharge 06/11/2020   Past Surgical History:  Procedure Laterality Date   ABDOMINAL HYSTERECTOMY  10/17/2020   [facility]; she has both ovaries   ANTERIOR CERVICAL DECOMP/DISCECTOMY FUSION N/A 07/01/2017   Procedure: ANTERIOR CERVICAL DECOMPRESSION/DISCECTOMY FUSION 3 LEVELS;  Surgeon: Clois Fret, MD;  Location: [facility] ORS;  Service: Neurosurgery;  Laterality: N/A;   CARDIAC CATHETERIZATION     CARPAL TUNNEL RELEASE Bilateral    CHOLECYSTECTOMY     KNEE ARTHROSCOPY Right    TUBAL LIGATION  03/10/96   Patient Active Problem List   Diagnosis Date Noted   Serous otitis media 08/22/2024   URI with cough and congestion 06/16/2024   Hypokalemia 06/09/2024   Nausea  06/08/2024   Abdominal bloating 05/05/2024   Cervical radicular pain 03/22/2024   Bilateral occipital neuralgia 03/22/2024   Sjogren syndrome with inflammatory arthritis 03/21/2024   MDD (major depressive disorder), recurrent episode, severe (HCC) 03/16/2024   Insomnia 02/25/2024   MDD (major depressive disorder), recurrent episode, moderate (HCC) 01/18/2024   OSA (obstructive sleep apnea) 01/18/2024   Seronegative spondyloarthropathy 01/05/2024   Episodic tension-type headache, not intractable 01/05/2024   Palpitations 11/24/2023   Wasp sting 11/24/2023   AKI (acute kidney injury) 11/02/2023   Other chest pain 10/29/2023   GAD (generalized anxiety disorder) 09/30/2023   Lower abdominal pain 07/22/2023   Abnormal glucose 04/24/2023   Mood disorder 04/24/2023   Annual physical exam 04/24/2023   Anemia 04/24/2023   Chronic frontal sinusitis 10/08/2022   Abdominal pain, left lower quadrant 06/30/2022   Morbid obesity with BMI of 40.0-44.9, adult (HCC) 04/30/2022   Allergic rhinitis 03/04/2021   Bilateral leg edema 01/29/2021   Lumbosacral radiculopathy at L5 01/29/2021   Neuropathy 01/29/2021   Vaginal discharge 06/11/2020   Bacterial vaginosis 06/11/2020   Pain medication agreement broken 03/07/2020   Prediabetes 10/10/2019   HLD (hyperlipidemia) 10/10/2019   Gastroesophageal reflux disease 09/06/2019   Fibroid uterus 09/06/2019   Seasonal asthma 11/29/2018   DDD (degenerative disc disease), cervical  09/27/2018   Cervical spondylosis 09/27/2018   DDD (degenerative disc disease), lumbar 09/27/2018   Vitamin D  deficiency 09/27/2018   Chronic pain syndrome 09/07/2018   Essential hypertension 05/31/2018    PCP: Gretel App, NP  REFERRING PROVIDER: Hilma Hastings, PA-C  REFERRING DIAG:  Z98.1 (ICD-10-CM) - S/P cervical spinal fusion M47.812 (ICD-10-CM) - Cervical spondylosis M54.12 (ICD-10-CM) - Cervical radiculopathy R20.0,R20.2 (ICD-10-CM) - Numbness and tingling in left  hand  THERAPY DIAG:  Muscle weakness (generalized)  Cervicalgia  Radiculopathy, cervical region  Rationale for Evaluation and Treatment: Rehabilitation  ONSET DATE: Chronic   SUBJECTIVE:                                                                                                                                                                                                         SUBJECTIVE STATEMENT: Patient reports she is having more pain recently and getting migraines. She is having trouble with dropping items in her hands. No issues with grip but maintaining the grip is difficult. The pain is more on the L than R. Patient reports if she turns her head too many times, she may get lightheaded.    Hand dominance: Left  PERTINENT HISTORY:  She is s/p C4-C7 ACDF on 07/01/17 by Dr. Clois. She continues with constant neck pain radiating down left arm to the hand causing numbness and tingling. She has intermittent pain in right harm. She has weakness in both hands.  PMH of HTN, asthma, GERD, chronic pain syndrome, hyperlipidemia, obesity.  PAIN:  Are you having pain? Yes: NPRS scale: 7/10; worst 10/10 Pain location: neck Pain description: aching, dull, throbbing Aggravating factors: housework, any activity  Relieving factors: heat, pain medication  PRECAUTIONS: None  RED FLAGS: Cervical red flags: Diplopia Yes:       WEIGHT BEARING RESTRICTIONS: No  FALLS:  Has patient fallen in last 6 months? No  LIVING ENVIRONMENT: Lives with: lives alone Lives in: House/apartment Has following equipment at home: Single point cane  OCCUPATION: not working, trying to get disability   PLOF: Independent - occasional assistance with ADLs when in severe pain  PATIENT GOALS: to see if I can get some type of relief    OBJECTIVE:  Note: Objective measures were completed at Evaluation unless otherwise noted.  DIAGNOSTIC FINDINGS:  N/A   PATIENT SURVEYS:  NDI:  NECK  DISABILITY INDEX  Date: 06/22/2024 Score  Pain intensity 3 = The pain is fairly severe at the moment  2. Personal care (washing, dressing, etc.) 3 =  I need some help but can manage  most of my personal care  3. Lifting 4 =  I can only lift very light weights  4. Reading 3 = I can't read as much as I want because of moderate pain in my neck  5. Headaches 4 = I have severe headaches, which come frequently   6. Concentration 2 = I have a fair degree of difficulty in concentrating when I want to  7. Work 4 = I can hardly do any work at all  8. Driving 2 =  I can drive my car as long as I want with moderate pain in my neck  9. Sleeping 5 =  My sleep is completely disturbed (5-7 hrs sleepless)   10. Recreation 3 = I am able to engage in a few of my usual recreation activities because of pain in   my neck  Total 33/50 = 66%   Minimum Detectable Change (90% confidence): 5 points or 10% points  COGNITION: Overall cognitive status: Within functional limits for tasks assessed  SENSATION: Intermittent numbness/tingling in B hands (L>R)  POSTURE: rounded shoulders and forward head  PALPATION: Tightness noted to B UT, cervical paraspinals, suboccipitals  Tenderness noted but patient reports this is likely due to autoimmune disorder.   CERVICAL ROM:   Active ROM A/PROM (deg) eval  Flexion 25  Extension 28  Right lateral flexion 25  Left lateral flexion 15  Right rotation 10  Left rotation 20   (Blank rows = not tested)  UPPER EXTREMITY MMT:  MMT Right eval Left eval  Shoulder flexion 4 4  Shoulder extension    Shoulder abduction 4 4  Shoulder adduction    Shoulder extension    Shoulder internal rotation    Shoulder external rotation    Elbow flexion 4 4  Elbow extension 4 4  Wrist flexion    Wrist extension    Wrist ulnar deviation    Wrist radial deviation    Wrist pronation    Wrist supination     (Blank rows = not tested)  UPPER EXTREMITY ROM:  AROM WFL    CERVICAL SPECIAL TESTS:  Distraction test: Positive - decrease in L UE numbness/tingling    TREATMENT DATE: 08/22/2024                                                                                                                               Subjective: Patient reports 8/10 NPS in the upper neck (R > L). Patient reports recent bout of sickness (ear infectio) and inflammatory exacerbation. Recent changes to medications prescribed in order to mitigate the pain. No further questions or concerns.   Therapeutic Exercise:  Shoulder Row    1 x 15 - 15#    2 x 15 - 20#    Lat Pull Down    2 x 15 - 20#   Pt educated throughout session about proper posture and technique with exercises. Improved exercise technique, movement at target joints,  use of target muscles after min to mod verbal, tactile cues.   Neuromuscular Re-education (with intent to improve cervical/scapular posture):  Standing Horizontal Abduction  2 x 15 - Red TB   Standing Dumbbell Scaption   2 x 15 - 3# DB  Seated Retro Shoulder Rolls  1 x 20 reps - Multimodal cues for proper technique.    Manual Therapy (25 min billed):  Moderate to Light STM applied to right and left cervical paraspinals, levator scapulae, upper trapezius musculature to decrease muscle tension. Myofascial release, trigger point and contract and relax techniques utilized. Pt endorsed improvements in pain following intervention.   Suboccipital Release  R/L side: 1s/bout x 2 in order to tissue extensibility and decrease myofascial tension    - Referral pattern to behind the eye  Notable trigger points and increased muscle tension along R/L Levator scapulae muscle and upper trapezius. Minimal improvements in muscle tension following manual interventions.  PATIENT EDUCATION:  Education details: Exercise Technique  Person educated: Patient Education method: Explanation, Demonstration, and Handouts Education comprehension: verbalized understanding  and returned demonstration  HOME EXERCISE PROGRAM: Access Code: [email or web address] URL: [email or web address] Date: 08/10/2024 Prepared by: Lonni Pall  Exercises - Seated Upper Trapezius Stretch  - 2-3 x daily - 5-7 x weekly - 3-5 reps - 30-60 seconds hold - Seated Levator Scapulae Stretch  - 2-3 x daily - 5-7 x weekly - 3-5 reps - 30-60 second hold - Seated Shoulder Rolls  - 2-3 x daily - 5-7 x weekly - 1 sets - 10 reps - Supine Suboccipital Release with Tennis Balls  - 2-3 x daily - 5-7 x weekly - as needed --> 1-2 minutes  hold - Standing Shoulder Row with Anchored Resistance  - 1 x daily - 3-4 x weekly - 2-3 sets - 10-12 reps - Seated Scapular Retraction  - 1 x daily - 3-4 x weekly - 2-3 sets - 10-12 reps - Shoulder External Rotation and Scapular Retraction with Resistance  - 1 x daily - 3-4 x weekly - 2-3 sets - 10-12 reps - Prone Shoulder Horizontal Abduction with External Rotation  - 1 x daily - 3-4 x weekly - 2-3 sets - 10-12 reps - First Rib Mobilization with Strap  - 1 x daily - 7 x weekly - 2-3 sets - 10 reps - 3s hold  Access Code: [email or web address] URL: [email or web address] Date: 07/12/2024 Prepared by: Lonni Leelyn Jasinski  Exercises - Seated Upper Trapezius Stretch  - 2-3 x daily - 5-7 x weekly - 3-5 reps - 30-60 seconds hold - Seated Levator Scapulae Stretch  - 2-3 x daily - 5-7 x weekly - 3-5 reps - 30-60 second hold - Seated Shoulder Rolls  - 2-3 x daily - 5-7 x weekly - 1 sets - 10 reps - Supine Suboccipital Release with Tennis Balls  - 2-3 x daily - 5-7 x weekly - as needed --> 1-2 minutes  hold - Standing Shoulder Row with Anchored Resistance  - 1 x daily - 3-4 x weekly - 2-3 sets - 10-12 reps - Seated Scapular Retraction  - 1 x daily - 3-4 x weekly - 2-3 sets - 10-12 reps - Shoulder External Rotation and Scapular Retraction with Resistance  - 1 x daily - 3-4 x weekly - 2-3 sets - 10-12 reps   ASSESSMENT:  CLINICAL IMPRESSION:  Patient arrives to  OPPT in management of cervicalgia. Session focused on scapular/posture retraining along with manual techniques to release muscle tension in upper neck. Palpation significant for increased  muscle tension along muscles groups mentioned above; pt endorsed improvements in pain following manual techniques. Remainder of the session focused on resistance training in order to improve scapular posture. She tolerated all interventions without exacerbation of upper neck pain; multimodal cues needed for correct scapular retraction, protraction and elevation. Patient's pain with minimal improvements secondary to other chronic conditions and recent infection. Patient still has intermittent upper neck pain that limits her from full participation with recreational and community based activities. Based on today's performance, pt will continue to benefit from skilled PT in order to facilitate return to PLOF and improve QoL.    OBJECTIVE IMPAIRMENTS: decreased balance, decreased endurance, decreased mobility, difficulty walking, decreased ROM, decreased strength, hypomobility, impaired flexibility, impaired sensation, postural dysfunction, and pain.   ACTIVITY LIMITATIONS: carrying, lifting, squatting, stairs, transfers, bathing, and dressing  PARTICIPATION LIMITATIONS: cleaning, laundry, and community activity  PERSONAL FACTORS: Age, Behavior pattern, Past/current experiences, and Time since onset of injury/illness/exacerbation are also affecting patient's functional outcome.   REHAB POTENTIAL: Good  CLINICAL DECISION MAKING: Stable/uncomplicated  EVALUATION COMPLEXITY: Moderate   GOALS: Goals reviewed with patient? Yes  SHORT TERM GOALS: Target date: 07/20/2024  Patient will be independent in HEP to improve strength/mobility for better functional independence with ADLs. Baseline: 06/22/24: HEP initiated   Goal status: INITIAL  LONG TERM GOALS: Target date: 10/12/2024  Patient will reduce Neck Disability  Index score to <10% to demonstrate minimal disability with ADL's including improved sleeping tolerance, sitting tolerance, etc for better mobility at home and work. Baseline: 06/22/24: 33/50 = 66%  Goal status: INITIAL  2.  Patient will report a worst pain of 3/10 on NRPS in neck to improve tolerance with ADLs and reduced symptoms with activities.  Baseline: 06/22/24: 10/10 worst  Goal status: INITIAL  3.  Patient will improve B UE strength by 1/3 MMT grade to demonstrate improvement in tolerance to activity and strength to perform ADLs independently.  Baseline: 06/22/24: see above Goal status: INITIAL  4.  Patient will improve cervical ROM in all directions by 10 degrees to improve ability to look in all directions while driving safely.  Baseline: 06/22/24: see above  Goal status: INITIAL  5.  Patient will report decrease in frequency of migraines to < 2 per week to demonstrate improvement in muscular tightness in cervical region and ability tolerate daily activities with reduction in pain.  Baseline: 06/22/24: daily; 08/04/24: 2-3x a week Goal status: Progressing   PLAN:  PT FREQUENCY: 1-2x/week  PT DURATION: 8 weeks  PLANNED INTERVENTIONS: [id number]- PT Re-evaluation, 97750- Physical Performance Testing, 97110-Therapeutic exercises, 97530- Therapeutic activity, 97112- Neuromuscular re-education, 97535- Self Care, [id number]- Manual therapy, [id number]- Electrical stimulation (unattended), [phone]- Electrical stimulation (manual), Patient/Family education, Joint mobilization, Joint manipulation, Spinal manipulation, Spinal mobilization, Cryotherapy, and Moist heat  PLAN FOR NEXT SESSION: HEP review, cervical distraction, manual therapy, cervicothoracic posture training  Lonni Pall PT, DPT Physical Therapist- [facility]  [facility] 08/22/2024, 5:33 PM   "

## 2024-08-23 ENCOUNTER — Ambulatory Visit: Payer: Self-pay | Admitting: Nurse Practitioner

## 2024-08-23 ENCOUNTER — Encounter: Payer: Self-pay | Admitting: Nurse Practitioner

## 2024-08-23 DIAGNOSIS — J321 Chronic frontal sinusitis: Secondary | ICD-10-CM

## 2024-08-23 MED ORDER — FLUTICASONE PROPIONATE 50 MCG/ACT NA SUSP: 2.0000 | Freq: Every day | NASAL | 6 refills | Status: AC

## 2024-08-30 ENCOUNTER — Ambulatory Visit: Payer: MEDICAID

## 2024-08-30 DIAGNOSIS — M542 Cervicalgia: Secondary | ICD-10-CM

## 2024-08-30 DIAGNOSIS — M5412 Radiculopathy, cervical region: Secondary | ICD-10-CM

## 2024-08-30 DIAGNOSIS — M6281 Muscle weakness (generalized): Secondary | ICD-10-CM | POA: Diagnosis not present

## 2024-08-30 NOTE — Therapy (Signed)
 " OUTPATIENT PHYSICAL THERAPY CERVICAL TREATMENT   Patient Name: Anita Gibbs MRN: [id number] DOB:March 23, 1971, 53 y.o., female Today's Date: 08/30/2024  END OF SESSION:  PT End of Session - 08/30/24 1554     Visit Number 9    Number of Visits 17    Date for Recertification  08/17/24    Authorization Type [employer] [id number] for 10 PT vsts from 12/4-01/31/25    Authorization Time Period 12/4-01/31/25    Authorization - Visit Number 4    Authorization - Number of Visits 10    PT Start Time 1556    PT Stop Time 1635    PT Time Calculation (min) 39 min    Activity Tolerance Patient tolerated treatment well    Behavior During Therapy [facility] for tasks assessed/performed                Past Medical History:  Diagnosis Date   Abdominal pain 06/11/2020   Abnormal drug screen 03/06/2020   Abnormal MRI, cervical spine (06/10/2018) 09/27/2018   FINDINGS:  Vertebrae: fusion hardware at C4, C5, C6, and C7.  Posterior Fossa, vertebral arteries, paraspinal tissues: A relatively empty sella present.     Disc levels:  C3-4: Negative. A mild broad-based disc osteophyte complex present. Uncovertebral spurring contributes to mild foraminal narrowing bilaterally.  C4-5: A leftward disc osteophyte complex is present. Uncovertebral and facet disease   Acute cystitis without hematuria 06/18/2020   Acute vaginitis 06/11/2020   Adenoma of left adrenal gland 08/01/2020   Adrenal nodule 06/20/2020   Allergy     ANA positive 02/01/2021   Anxiety and depression 01/29/2021   Arthritis    Back pain with history of spinal surgery 01/29/2021   Bilateral carpal tunnel syndrome 01/29/2021   Bilateral leg edema 01/29/2021   BMI 40.0-44.9, adult (HCC) 04/30/2022   Carpal boss, right    Carpal tunnel syndrome, bilateral    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) 09/27/2018   Levels:  C4-5: There is partial effacement of ventral CSF.     IMPRESSION:  Mild residual central canal narrowing at C4-5 s/p ACDF.    Cervical facet hypertrophy (C3-T1) 09/27/2018   Levels:  C3-4: Uncovertebral spurring  C4-5: Uncovertebral and facet disease  C5-6: Residual uncovertebral disease.  C6-7: Residual uncovertebral disease.  C7-T1: Minimal left-sided uncovertebral spurring   Cervical facet joint syndrome (Bilateral) (L>R) 09/27/2018   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-5, C5-6, and C6-7) 09/27/2018   Levels:  C3-4: Mild foraminal narrowing bilaterally.  C4-5: Mild left foraminal narrowing.  C5-6: Mild left foraminal narrowing is due to residual uncovertebral disease.  C6-7: Mild left foraminal narrowing is due to residual uncovertebral disease.   Cervical myelopathy (HCC) 07/01/2017   Cervicalgia (Primary Area of Pain) (Bilateral) (L>R) 09/27/2018   Chronic gout of foot (Left) 05/31/2018   Chronic low back pain [facility] Area of Pain) (Bilateral) (R>L) w/ sciatica (Bilateral) 09/07/2018   Chronic low back pain [facility] Area of Pain) (Bilateral) (R>L) w/o sciatica 07/26/2018   Chronic lower extremity pain (Fourth Area of Pain) (Bilateral) (R>L) 09/07/2018   Chronic musculoskeletal pain 10/27/2018   Chronic neck pain (Bilateral) w/ history of cervical spinal surgery 09/27/2018   Chronic neck pain (Primary Area of Pain) (Bilateral) (L>R) 09/07/2018   Chronic sacroiliac joint dysfunction (Bilateral) 09/27/2018   Chronic sacroiliac joint pain (Right) 09/07/2018   Chronic upper extremity pain (Secondary Area of Pain) (Bilateral) (R>L) 09/27/2018   Disorder of skeletal system 09/07/2018   E. coli UTI (urinary tract infection)  06/20/2020   Elevated sed rate 09/08/2018   Elevated serum creatinine 06/20/2020   Excessive daytime sleepiness 03/06/2020   Female pelvic pain 09/05/2020   GERD (gastroesophageal reflux disease)    Headache    History of allergy  to shellfish 09/28/2018   History of fusion of cervical spine (ACDF C4-C7) 09/27/2018   History of illicit drug use 03/07/2020   Hives  09/05/2020   Hives 09/05/2020   Hyperkalemia 05/31/2018   Mild   Hyperlipidemia 04/30/2022   Hypertension    Large breasts 05/30/2020   Left hip pain 07/21/2022   Lumbar facet arthropathy 04/20/2019   Lumbar radiculitis (L5 dermatome) (Right) 04/17/2020   Multiple environmental allergies 01/17/2020   Multiple food allergies 03/07/2020   Neurogenic pain 10/27/2018   Numbness and tingling of both feet 05/31/2018   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   Other intervertebral disc degeneration, lumbar region 11/15/2019   Pharmacologic therapy 09/07/2018   Plantar fasciitis, bilateral 03/04/2021   Polyneuropathy 01/29/2021   Prediabetes 10/10/2019   Problems influencing health status 09/07/2018   Rash 08/08/2019   Seasonal allergic rhinitis due to pollen 11/29/2018   Seasonal allergies 03/06/2020   Seasonal asthma 11/29/2018   Sjogren's disease    Sleep apnea    Somatic dysfunction of sacroiliac joint (Bilateral) 09/27/2018   Spondylosis without myelopathy or radiculopathy, cervical region 03/22/2019   Spondylosis without myelopathy or radiculopathy, lumbosacral region 05/19/2019   Spondylosis, cervical, w/ myelopathy 09/27/2018   URI with cough and congestion 06/16/2024   Vaginal discharge 06/11/2020   Past Surgical History:  Procedure Laterality Date   ABDOMINAL HYSTERECTOMY  10/17/2020   [facility]; she has both ovaries   ANTERIOR CERVICAL DECOMP/DISCECTOMY FUSION N/A 07/01/2017   Procedure: ANTERIOR CERVICAL DECOMPRESSION/DISCECTOMY FUSION 3 LEVELS;  Surgeon: Clois Fret, MD;  Location: [facility] ORS;  Service: Neurosurgery;  Laterality: N/A;   CARDIAC CATHETERIZATION     CARPAL TUNNEL RELEASE Bilateral    CHOLECYSTECTOMY     KNEE ARTHROSCOPY Right    TUBAL LIGATION  03/10/96   Patient Active Problem List   Diagnosis Date Noted   Serous otitis media 08/22/2024   URI with cough and congestion 06/16/2024   Hypokalemia 06/09/2024   Nausea 06/08/2024    Abdominal bloating 05/05/2024   Cervical radicular pain 03/22/2024   Bilateral occipital neuralgia 03/22/2024   Sjogren syndrome with inflammatory arthritis 03/21/2024   MDD (major depressive disorder), recurrent episode, severe (HCC) 03/16/2024   Insomnia 02/25/2024   MDD (major depressive disorder), recurrent episode, moderate (HCC) 01/18/2024   OSA (obstructive sleep apnea) 01/18/2024   Seronegative spondyloarthropathy 01/05/2024   Episodic tension-type headache, not intractable 01/05/2024   Palpitations 11/24/2023   Wasp sting 11/24/2023   AKI (acute kidney injury) 11/02/2023   Other chest pain 10/29/2023   GAD (generalized anxiety disorder) 09/30/2023   Lower abdominal pain 07/22/2023   Abnormal glucose 04/24/2023   Mood disorder 04/24/2023   Annual physical exam 04/24/2023   Anemia 04/24/2023   Chronic frontal sinusitis 10/08/2022   Abdominal pain, left lower quadrant 06/30/2022   Morbid obesity with BMI of 40.0-44.9, adult (HCC) 04/30/2022   Allergic rhinitis 03/04/2021   Bilateral leg edema 01/29/2021   Lumbosacral radiculopathy at L5 01/29/2021   Neuropathy 01/29/2021   Vaginal discharge 06/11/2020   Bacterial vaginosis 06/11/2020   Pain medication agreement broken 03/07/2020   Prediabetes 10/10/2019   HLD (hyperlipidemia) 10/10/2019   Gastroesophageal reflux disease 09/06/2019   Fibroid uterus 09/06/2019   Seasonal asthma 11/29/2018   DDD (degenerative  disc disease), cervical 09/27/2018   Cervical spondylosis 09/27/2018   DDD (degenerative disc disease), lumbar 09/27/2018   Vitamin D  deficiency 09/27/2018   Chronic pain syndrome 09/07/2018   Essential hypertension 05/31/2018    PCP: Gretel App, NP  REFERRING PROVIDER: Hilma Hastings, PA-C  REFERRING DIAG:  Z98.1 (ICD-10-CM) - S/P cervical spinal fusion M47.812 (ICD-10-CM) - Cervical spondylosis M54.12 (ICD-10-CM) - Cervical radiculopathy R20.0,R20.2 (ICD-10-CM) - Numbness and tingling in left  hand  THERAPY DIAG:  Muscle weakness (generalized)  Cervicalgia  Radiculopathy, cervical region  Rationale for Evaluation and Treatment: Rehabilitation  ONSET DATE: Chronic   SUBJECTIVE:                                                                                                                                                                                                         SUBJECTIVE STATEMENT: Patient reports she is having more pain recently and getting migraines. She is having trouble with dropping items in her hands. No issues with grip but maintaining the grip is difficult. The pain is more on the L than R. Patient reports if she turns her head too many times, she may get lightheaded.    Hand dominance: Left  PERTINENT HISTORY:  She is s/p C4-C7 ACDF on 07/01/17 by Dr. Clois. She continues with constant neck pain radiating down left arm to the hand causing numbness and tingling. She has intermittent pain in right harm. She has weakness in both hands.  PMH of HTN, asthma, GERD, chronic pain syndrome, hyperlipidemia, obesity.  PAIN:  Are you having pain? Yes: NPRS scale: 7/10; worst 10/10 Pain location: neck Pain description: aching, dull, throbbing Aggravating factors: housework, any activity  Relieving factors: heat, pain medication  PRECAUTIONS: None  RED FLAGS: Cervical red flags: Diplopia Yes:       WEIGHT BEARING RESTRICTIONS: No  FALLS:  Has patient fallen in last 6 months? No  LIVING ENVIRONMENT: Lives with: lives alone Lives in: House/apartment Has following equipment at home: Single point cane  OCCUPATION: not working, trying to get disability   PLOF: Independent - occasional assistance with ADLs when in severe pain  PATIENT GOALS: to see if I can get some type of relief    OBJECTIVE:  Note: Objective measures were completed at Evaluation unless otherwise noted.  DIAGNOSTIC FINDINGS:  N/A   PATIENT SURVEYS:  NDI:  NECK  DISABILITY INDEX  Date: 06/22/2024 Score  Pain intensity 3 = The pain is fairly severe at the moment  2. Personal care (washing, dressing, etc.) 3 =  I need some help  but can manage most of my personal care  3. Lifting 4 =  I can only lift very light weights  4. Reading 3 = I can't read as much as I want because of moderate pain in my neck  5. Headaches 4 = I have severe headaches, which come frequently   6. Concentration 2 = I have a fair degree of difficulty in concentrating when I want to  7. Work 4 = I can hardly do any work at all  8. Driving 2 =  I can drive my car as long as I want with moderate pain in my neck  9. Sleeping 5 =  My sleep is completely disturbed (5-7 hrs sleepless)   10. Recreation 3 = I am able to engage in a few of my usual recreation activities because of pain in   my neck  Total 33/50 = 66%   Minimum Detectable Change (90% confidence): 5 points or 10% points  COGNITION: Overall cognitive status: Within functional limits for tasks assessed  SENSATION: Intermittent numbness/tingling in B hands (L>R)  POSTURE: rounded shoulders and forward head  PALPATION: Tightness noted to B UT, cervical paraspinals, suboccipitals  Tenderness noted but patient reports this is likely due to autoimmune disorder.   CERVICAL ROM:   Active ROM A/PROM (deg) eval  Flexion 25  Extension 28  Right lateral flexion 25  Left lateral flexion 15  Right rotation 10  Left rotation 20   (Blank rows = not tested)  UPPER EXTREMITY MMT:  MMT Right eval Left eval  Shoulder flexion 4 4  Shoulder extension    Shoulder abduction 4 4  Shoulder adduction    Shoulder extension    Shoulder internal rotation    Shoulder external rotation    Elbow flexion 4 4  Elbow extension 4 4  Wrist flexion    Wrist extension    Wrist ulnar deviation    Wrist radial deviation    Wrist pronation    Wrist supination     (Blank rows = not tested)  UPPER EXTREMITY ROM:  AROM WFL    CERVICAL SPECIAL TESTS:  Distraction test: Positive - decrease in L UE numbness/tingling    TREATMENT DATE: 08/30/2024                                                                                                                               Subjective: Patient reports 5/10 NPS in the upper neck (L > R). She still reports dealing with ear infection.  No further questions or concerns.     Neuromuscular Re-education (with intent to improve cervical/scapular posture):  Shoulder Row (cable)   2 x 15 - 20#  1 x 15 - 25#   Lat Pull Down (Cable)  2 x 15 - 20#   Standing Shoulder Horizontal Abduction   1 x 12 - Red TB   2 x 12 - Green TB   Standing Dumbbell Scaption  3 x 10 - 3# DB - Improved technique, no cues from PT  Resisted Serratus Wall Slides   1 x 10 - YTB around wrist    Alternating UE Serratus Wall Slide  2 x 10 - YTB around wrist     Manual Therapy (12 min billed):  Prone position  Moderate to Light STM applied to right and left cervical paraspinals, levator scapulae, upper trapezius musculature to decrease muscle tension. Myofascial release, trigger point and contract and relax techniques utilized. Pt endorsed improvements in pain following intervention.   PATIENT EDUCATION:  Education details: Exercise Technique  Person educated: Patient Education method: Explanation, Demonstration, and Handouts Education comprehension: verbalized understanding and returned demonstration  HOME EXERCISE PROGRAM: Access Code: [email or web address] URL: [email or web address] Date: 08/10/2024 Prepared by: Lonni Pall  Exercises - Seated Upper Trapezius Stretch  - 2-3 x daily - 5-7 x weekly - 3-5 reps - 30-60 seconds hold - Seated Levator Scapulae Stretch  - 2-3 x daily - 5-7 x weekly - 3-5 reps - 30-60 second hold - Seated Shoulder Rolls  - 2-3 x daily - 5-7 x weekly - 1 sets - 10 reps - Supine Suboccipital Release with Tennis Balls  - 2-3 x daily - 5-7 x weekly - as  needed --> 1-2 minutes  hold - Standing Shoulder Row with Anchored Resistance  - 1 x daily - 3-4 x weekly - 2-3 sets - 10-12 reps - Seated Scapular Retraction  - 1 x daily - 3-4 x weekly - 2-3 sets - 10-12 reps - Shoulder External Rotation and Scapular Retraction with Resistance  - 1 x daily - 3-4 x weekly - 2-3 sets - 10-12 reps - Prone Shoulder Horizontal Abduction with External Rotation  - 1 x daily - 3-4 x weekly - 2-3 sets - 10-12 reps - First Rib Mobilization with Strap  - 1 x daily - 7 x weekly - 2-3 sets - 10 reps - 3s hold  Access Code: [email or web address] URL: [email or web address] Date: 07/12/2024 Prepared by: Lonni Yousaf Sainato  Exercises - Seated Upper Trapezius Stretch  - 2-3 x daily - 5-7 x weekly - 3-5 reps - 30-60 seconds hold - Seated Levator Scapulae Stretch  - 2-3 x daily - 5-7 x weekly - 3-5 reps - 30-60 second hold - Seated Shoulder Rolls  - 2-3 x daily - 5-7 x weekly - 1 sets - 10 reps - Supine Suboccipital Release with Tennis Balls  - 2-3 x daily - 5-7 x weekly - as needed --> 1-2 minutes  hold - Standing Shoulder Row with Anchored Resistance  - 1 x daily - 3-4 x weekly - 2-3 sets - 10-12 reps - Seated Scapular Retraction  - 1 x daily - 3-4 x weekly - 2-3 sets - 10-12 reps - Shoulder External Rotation and Scapular Retraction with Resistance  - 1 x daily - 3-4 x weekly - 2-3 sets - 10-12 reps   ASSESSMENT:  CLINICAL IMPRESSION:  Patient arrives to OPPT in management of cervicalgia. Session with continued focused on scapular/posture retraining along with manual techniques to release muscle tension in upper neck.Manual techniques focused on L upper cervical in order to treat today's reported pain. She tolerated all interventions without exacerbation of upper neck pain. Good ability to perform resistive exercises against increased intensity. Patient's pain with minimal improvements from PT interventions. PT encouraged adherence to HEP for better carryover. PT plan to  reassess progress towards PT goals. Patient still has intermittent upper neck pain that limits her from full  participation with recreational and community based activities.Based on today's performance, pt will continue to benefit from skilled PT in order to facilitate return to PLOF and improve QoL.    OBJECTIVE IMPAIRMENTS: decreased balance, decreased endurance, decreased mobility, difficulty walking, decreased ROM, decreased strength, hypomobility, impaired flexibility, impaired sensation, postural dysfunction, and pain.   ACTIVITY LIMITATIONS: carrying, lifting, squatting, stairs, transfers, bathing, and dressing  PARTICIPATION LIMITATIONS: cleaning, laundry, and community activity  PERSONAL FACTORS: Age, Behavior pattern, Past/current experiences, and Time since onset of injury/illness/exacerbation are also affecting patient's functional outcome.   REHAB POTENTIAL: Good  CLINICAL DECISION MAKING: Stable/uncomplicated  EVALUATION COMPLEXITY: Moderate   GOALS: Goals reviewed with patient? Yes  SHORT TERM GOALS: Target date: 07/20/2024  Patient will be independent in HEP to improve strength/mobility for better functional independence with ADLs. Baseline: 06/22/24: HEP initiated   Goal status: INITIAL  LONG TERM GOALS: Target date: 08/17/2024  Patient will reduce Neck Disability Index score to <10% to demonstrate minimal disability with ADL's including improved sleeping tolerance, sitting tolerance, etc for better mobility at home and work. Baseline: 06/22/24: 33/50 = 66%  Goal status: INITIAL  2.  Patient will report a worst pain of 3/10 on NRPS in neck to improve tolerance with ADLs and reduced symptoms with activities.  Baseline: 06/22/24: 10/10 worst  Goal status: INITIAL  3.  Patient will improve B UE strength by 1/3 MMT grade to demonstrate improvement in tolerance to activity and strength to perform ADLs independently.  Baseline: 06/22/24: see above Goal status:  INITIAL  4.  Patient will improve cervical ROM in all directions by 10 degrees to improve ability to look in all directions while driving safely.  Baseline: 06/22/24: see above  Goal status: INITIAL  5.  Patient will report decrease in frequency of migraines to < 2 per week to demonstrate improvement in muscular tightness in cervical region and ability tolerate daily activities with reduction in pain.  Baseline: 06/22/24: daily; 08/04/24: 2-3x a week Goal status: Progressing   PLAN:  PT FREQUENCY: 1-2x/week  PT DURATION: 8 weeks  PLANNED INTERVENTIONS: 97164- PT Re-evaluation, 97750- Physical Performance Testing, 97110-Therapeutic exercises, 97530- Therapeutic activity, 97112- Neuromuscular re-education, 97535- Self Care, [id number]- Manual therapy, [id number]- Electrical stimulation (unattended), [phone]- Electrical stimulation (manual), Patient/Family education, Joint mobilization, Joint manipulation, Spinal manipulation, Spinal mobilization, Cryotherapy, and Moist heat  PLAN FOR NEXT SESSION: HEP review, cervical distraction, manual therapy, cervicothoracic posture training  Lonni Pall PT, DPT Physical Therapist- [facility]  [facility] 08/30/2024, 5:43 PM   "

## 2024-09-05 ENCOUNTER — Ambulatory Visit: Payer: MEDICAID | Admitting: Clinical

## 2024-09-05 DIAGNOSIS — F32A Depression, unspecified: Secondary | ICD-10-CM | POA: Diagnosis not present

## 2024-09-05 DIAGNOSIS — F410 Panic disorder [episodic paroxysmal anxiety] without agoraphobia: Secondary | ICD-10-CM

## 2024-09-05 DIAGNOSIS — F411 Generalized anxiety disorder: Secondary | ICD-10-CM

## 2024-09-05 NOTE — Progress Notes (Signed)
   Darice Seats, LCSW

## 2024-09-05 NOTE — Progress Notes (Signed)
 "  [facility] Counselor/Therapist Progress Note  Patient ID: Anita Gibbs, MRN: [id number],    Date: 09/05/2024  Time Spent: 8:34am - 9:18am : 44 minutes   Treatment Type: Individual Therapy  Reported Symptoms: patient reported experiencing panic attacks 2 times per week  Mental Status Exam: Appearance:  Casual and Neat     Behavior: Appropriate  Motor: Normal  Speech/Language:  Clear and Coherent and Normal Rate  Affect: Appropriate  Mood: normal  Thought process: normal  Thought content:   WNL  Sensory/Perceptual disturbances:   WNL  Orientation: oriented to person, place, time/date, and situation  Attention: Good  Concentration: Good  Memory: WNL  Fund of knowledge:  Good  Insight:   Good  Judgment:  Good  Impulse Control: Good   Risk Assessment: Danger to Self:  No Patient denied current suicidal ideation  Self-injurious Behavior: No Danger to Others: No Patient denied current homicidal ideation Duty to Warn:no Physical Aggression / Violence:No  Access to Firearms a concern: No  Gang Involvement:No   Subjective: Patient reported no changes since last session. Patient stated, I've been feeling ok in response to mood since last session. Patient stated, they've been ok in reference to symptoms of depression and anxiety. Patient reported a little bit in reference to symptoms of anxiety and reported experiencing panic attacks twice a week. Patient stated, a little bit but not as bad as I was in reference to intensity and frequency of symptoms of depression. Patient stated, my mood today is fine. Patient reported patient has been making changes to patient's diet due to recent gallbladder surgery and patient's overall health. Patient reported patient's disability was approved and patient will start receiving disability income this month in response to patient's first short term goal. Patient stated, it has progressed, 80% in response to first short  term goal. Patient stated, panic attacks is about twice a week now, 60% in response to patient's second short term goal. Patient stated, that's at 75%, Ive just been trying to weigh out the negative with positive stuff in response to patient's third short term goal. Patient stated, I'm not there yet in response to patient's fourth short term goal. Patient reported physical over exertion triggers panic attacks. Patient reported patient enjoys reading, travel, spending time with grandchildren. Patient reported driving triggered a panic attack and reported feelings of fear when driving.  Interventions: Cognitive Behavioral Therapy and Motivational Interviewing. Clinician conducted session via caregility video from clinician's home office. Patient provided verbal consent to proceed with telehealth session and is aware of limitations of telephone or video visits. Patient participated in session from patient's home. Reviewed events since last session and assessed for changes. Discussed intensity and frequency of symptoms of depression and anxiety. Reviewed patient's short term goals and patient's progress towards short term goals. Discussed triggers for anxiety/panic attacks. Explored and identified positive activities. Provided psycho education related to positive activities and benefit to mood.    Collaboration of Care: not required at this time   Diagnosis:  Panic disorder   Generalized anxiety disorder   Depression, unspecified depression type     Plan: Patient is to utilize [employer] Therapy, thought re-framing, behavioral activation, relaxation techniques, mindfulness and coping strategies to decrease symptoms associated with their diagnosis. Frequency: bi-weekly  Modality: individual      Long-term goal:   Reduce overall level, frequency, and intensity of the feelings of depression, anxiety and panic as evidenced by decreased panic attacks,fear of future panic attacks, lack  of   concentration, difficulty falling asleep and staying asleep, muscle tension, feeling on edge, restlessness, and irritability from 7 days/week to 1 to 3 days/week per patient report for at least 3 consecutive months. Target Date: 12/03/24  Progress: progressing    Short-term goal:  Identify and access local resources to increase patient's financial stability  Target Date: 12/03/24  Progress: progressing    Decrease feelings of panic/anxiety from 4 to 5 days per week to 1 to 2 days per week by implementing healthy coping strategies, such as, relaxation techniques and mindfulness exercises Target Date: 12/03/24  Progress: progressing    Identify, challenge, and replace negative thought patterns and negative self talk that contribute to feelings of depression and anxiety with positive thoughts, beliefs, and positive self talk per patient's report Target Date: 12/03/24  Progress: progressing    Increase patient's participation in activities patient enjoys from 1 time per week to 3 times per week  Target Date: 12/03/24  Progress: progressing       Darice Seats, LCSW    "

## 2024-09-06 ENCOUNTER — Ambulatory Visit: Payer: MEDICAID | Attending: Orthopedic Surgery

## 2024-09-06 DIAGNOSIS — M5412 Radiculopathy, cervical region: Secondary | ICD-10-CM | POA: Diagnosis present

## 2024-09-06 DIAGNOSIS — M542 Cervicalgia: Secondary | ICD-10-CM | POA: Diagnosis present

## 2024-09-06 DIAGNOSIS — M6281 Muscle weakness (generalized): Secondary | ICD-10-CM | POA: Diagnosis present

## 2024-09-06 NOTE — Addendum Note (Signed)
 Addended by: KRISTA LONNI PARAS on: 09/06/2024 02:01 PM   Modules accepted: Orders

## 2024-09-06 NOTE — Therapy (Signed)
 " OUTPATIENT PHYSICAL THERAPY CERVICAL TREATMENT/PROGRESS NOTE Dates of reporting period  06/22/2025   to   09/06/2024     Patient Name: Anita Gibbs MRN: [id number] DOB:02-22-1971, 54 y.o., female Today's Date: 09/06/2024  END OF SESSION:  PT End of Session - 09/06/24 1346     Visit Number 10    Number of Visits 17    Date for Recertification  10/12/24    Authorization Type [employer] [id number] for 10 PT vsts from 12/4-01/31/25    Authorization Time Period 12/4-01/31/25    Authorization - Visit Number 5    Authorization - Number of Visits 10    PT Start Time 1345    PT Stop Time 1425    PT Time Calculation (min) 40 min    Activity Tolerance Patient tolerated treatment well    Behavior During Therapy [facility] for tasks assessed/performed          Past Medical History:  Diagnosis Date   Abdominal pain 06/11/2020   Abnormal drug screen 03/06/2020   Abnormal MRI, cervical spine (06/10/2018) 09/27/2018   FINDINGS:  Vertebrae: fusion hardware at C4, C5, C6, and C7.  Posterior Fossa, vertebral arteries, paraspinal tissues: A relatively empty sella present.     Disc levels:  C3-4: Negative. A mild broad-based disc osteophyte complex present. Uncovertebral spurring contributes to mild foraminal narrowing bilaterally.  C4-5: A leftward disc osteophyte complex is present. Uncovertebral and facet disease   Acute cystitis without hematuria 06/18/2020   Acute vaginitis 06/11/2020   Adenoma of left adrenal gland 08/01/2020   Adrenal nodule 06/20/2020   Allergy     ANA positive 02/01/2021   Anxiety and depression 01/29/2021   Arthritis    Back pain with history of spinal surgery 01/29/2021   Bilateral carpal tunnel syndrome 01/29/2021   Bilateral leg edema 01/29/2021   BMI 40.0-44.9, adult (HCC) 04/30/2022   Carpal boss, right    Carpal tunnel syndrome, bilateral    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) 09/27/2018   Levels:  C4-5: There is partial effacement of ventral CSF.      IMPRESSION:  Mild residual central canal narrowing at C4-5 s/p ACDF.   Cervical facet hypertrophy (C3-T1) 09/27/2018   Levels:  C3-4: Uncovertebral spurring  C4-5: Uncovertebral and facet disease  C5-6: Residual uncovertebral disease.  C6-7: Residual uncovertebral disease.  C7-T1: Minimal left-sided uncovertebral spurring   Cervical facet joint syndrome (Bilateral) (L>R) 09/27/2018   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-5, C5-6, and C6-7) 09/27/2018   Levels:  C3-4: Mild foraminal narrowing bilaterally.  C4-5: Mild left foraminal narrowing.  C5-6: Mild left foraminal narrowing is due to residual uncovertebral disease.  C6-7: Mild left foraminal narrowing is due to residual uncovertebral disease.   Cervical myelopathy (HCC) 07/01/2017   Cervicalgia (Primary Area of Pain) (Bilateral) (L>R) 09/27/2018   Chronic gout of foot (Left) 05/31/2018   Chronic low back pain [facility] Area of Pain) (Bilateral) (R>L) w/ sciatica (Bilateral) 09/07/2018   Chronic low back pain [facility] Area of Pain) (Bilateral) (R>L) w/o sciatica 07/26/2018   Chronic lower extremity pain (Fourth Area of Pain) (Bilateral) (R>L) 09/07/2018   Chronic musculoskeletal pain 10/27/2018   Chronic neck pain (Bilateral) w/ history of cervical spinal surgery 09/27/2018   Chronic neck pain (Primary Area of Pain) (Bilateral) (L>R) 09/07/2018   Chronic sacroiliac joint dysfunction (Bilateral) 09/27/2018   Chronic sacroiliac joint pain (Right) 09/07/2018   Chronic upper extremity pain (Secondary Area of Pain) (Bilateral) (R>L) 09/27/2018   Disorder of skeletal system  09/07/2018   E. coli UTI (urinary tract infection) 06/20/2020   Elevated sed rate 09/08/2018   Elevated serum creatinine 06/20/2020   Excessive daytime sleepiness 03/06/2020   Female pelvic pain 09/05/2020   GERD (gastroesophageal reflux disease)    Headache    History of allergy  to shellfish 09/28/2018   History of fusion of cervical spine (ACDF C4-C7)  09/27/2018   History of illicit drug use 03/07/2020   Hives 09/05/2020   Hives 09/05/2020   Hyperkalemia 05/31/2018   Mild   Hyperlipidemia 04/30/2022   Hypertension    Large breasts 05/30/2020   Left hip pain 07/21/2022   Lumbar facet arthropathy 04/20/2019   Lumbar radiculitis (L5 dermatome) (Right) 04/17/2020   Multiple environmental allergies 01/17/2020   Multiple food allergies 03/07/2020   Neurogenic pain 10/27/2018   Numbness and tingling of both feet 05/31/2018   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   Other intervertebral disc degeneration, lumbar region 11/15/2019   Pharmacologic therapy 09/07/2018   Plantar fasciitis, bilateral 03/04/2021   Polyneuropathy 01/29/2021   Prediabetes 10/10/2019   Problems influencing health status 09/07/2018   Rash 08/08/2019   Seasonal allergic rhinitis due to pollen 11/29/2018   Seasonal allergies 03/06/2020   Seasonal asthma 11/29/2018   Sjogren's disease    Sleep apnea    Somatic dysfunction of sacroiliac joint (Bilateral) 09/27/2018   Spondylosis without myelopathy or radiculopathy, cervical region 03/22/2019   Spondylosis without myelopathy or radiculopathy, lumbosacral region 05/19/2019   Spondylosis, cervical, w/ myelopathy 09/27/2018   URI with cough and congestion 06/16/2024   Vaginal discharge 06/11/2020   Past Surgical History:  Procedure Laterality Date   ABDOMINAL HYSTERECTOMY  10/17/2020   [facility]; she has both ovaries   ANTERIOR CERVICAL DECOMP/DISCECTOMY FUSION N/A 07/01/2017   Procedure: ANTERIOR CERVICAL DECOMPRESSION/DISCECTOMY FUSION 3 LEVELS;  Surgeon: Clois Fret, MD;  Location: [facility] ORS;  Service: Neurosurgery;  Laterality: N/A;   CARDIAC CATHETERIZATION     CARPAL TUNNEL RELEASE Bilateral    CHOLECYSTECTOMY     KNEE ARTHROSCOPY Right    TUBAL LIGATION  03/10/96   Patient Active Problem List   Diagnosis Date Noted   Serous otitis media 08/22/2024   URI with cough and congestion  06/16/2024   Hypokalemia 06/09/2024   Nausea 06/08/2024   Abdominal bloating 05/05/2024   Cervical radicular pain 03/22/2024   Bilateral occipital neuralgia 03/22/2024   Sjogren syndrome with inflammatory arthritis 03/21/2024   MDD (major depressive disorder), recurrent episode, severe (HCC) 03/16/2024   Insomnia 02/25/2024   MDD (major depressive disorder), recurrent episode, moderate (HCC) 01/18/2024   OSA (obstructive sleep apnea) 01/18/2024   Seronegative spondyloarthropathy 01/05/2024   Episodic tension-type headache, not intractable 01/05/2024   Palpitations 11/24/2023   Wasp sting 11/24/2023   AKI (acute kidney injury) 11/02/2023   Other chest pain 10/29/2023   GAD (generalized anxiety disorder) 09/30/2023   Lower abdominal pain 07/22/2023   Abnormal glucose 04/24/2023   Mood disorder 04/24/2023   Annual physical exam 04/24/2023   Anemia 04/24/2023   Chronic frontal sinusitis 10/08/2022   Abdominal pain, left lower quadrant 06/30/2022   Morbid obesity with BMI of 40.0-44.9, adult (HCC) 04/30/2022   Allergic rhinitis 03/04/2021   Bilateral leg edema 01/29/2021   Lumbosacral radiculopathy at L5 01/29/2021   Neuropathy 01/29/2021   Vaginal discharge 06/11/2020   Bacterial vaginosis 06/11/2020   Pain medication agreement broken 03/07/2020   Prediabetes 10/10/2019   HLD (hyperlipidemia) 10/10/2019   Gastroesophageal reflux disease 09/06/2019   Fibroid uterus 09/06/2019  Seasonal asthma 11/29/2018   DDD (degenerative disc disease), cervical 09/27/2018   Cervical spondylosis 09/27/2018   DDD (degenerative disc disease), lumbar 09/27/2018   Vitamin D  deficiency 09/27/2018   Chronic pain syndrome 09/07/2018   Essential hypertension 05/31/2018    PCP: Gretel App, NP  REFERRING PROVIDER: Hilma Hastings, PA-C  REFERRING DIAG:  Z98.1 (ICD-10-CM) - S/P cervical spinal fusion M47.812 (ICD-10-CM) - Cervical spondylosis M54.12 (ICD-10-CM) - Cervical  radiculopathy R20.0,R20.2 (ICD-10-CM) - Numbness and tingling in left hand  THERAPY DIAG:  Muscle weakness (generalized)  Cervicalgia  Radiculopathy, cervical region  Rationale for Evaluation and Treatment: Rehabilitation  ONSET DATE: Chronic   SUBJECTIVE:                                                                                                                                                                                                         SUBJECTIVE STATEMENT: Patient reports she is having more pain recently and getting migraines. She is having trouble with dropping items in her hands. No issues with grip but maintaining the grip is difficult. The pain is more on the L than R. Patient reports if she turns her head too many times, she may get lightheaded.    Hand dominance: Left  PERTINENT HISTORY:  She is s/p C4-C7 ACDF on 07/01/17 by Dr. Clois. She continues with constant neck pain radiating down left arm to the hand causing numbness and tingling. She has intermittent pain in right harm. She has weakness in both hands.  PMH of HTN, asthma, GERD, chronic pain syndrome, hyperlipidemia, obesity.  PAIN:  Are you having pain? Yes: NPRS scale: 7/10; worst 10/10 Pain location: neck Pain description: aching, dull, throbbing Aggravating factors: housework, any activity  Relieving factors: heat, pain medication  PRECAUTIONS: None  RED FLAGS: Cervical red flags: Diplopia Yes:       WEIGHT BEARING RESTRICTIONS: No  FALLS:  Has patient fallen in last 6 months? No  LIVING ENVIRONMENT: Lives with: lives alone Lives in: House/apartment Has following equipment at home: Single point cane  OCCUPATION: not working, trying to get disability   PLOF: Independent - occasional assistance with ADLs when in severe pain  PATIENT GOALS: to see if I can get some type of relief    OBJECTIVE:  Note: Objective measures were completed at Evaluation unless otherwise  noted.  DIAGNOSTIC FINDINGS:  N/A   PATIENT SURVEYS:  NDI:  NECK DISABILITY INDEX  Date: 06/22/2024 Score  Pain intensity 3 = The pain is fairly severe at the moment  2. Personal care (washing, dressing, etc.)  3 =  I need some help but can manage most of my personal care  3. Lifting 4 =  I can only lift very light weights  4. Reading 3 = I can't read as much as I want because of moderate pain in my neck  5. Headaches 4 = I have severe headaches, which come frequently   6. Concentration 2 = I have a fair degree of difficulty in concentrating when I want to  7. Work 4 = I can hardly do any work at all  8. Driving 2 =  I can drive my car as long as I want with moderate pain in my neck  9. Sleeping 5 =  My sleep is completely disturbed (5-7 hrs sleepless)   10. Recreation 3 = I am able to engage in a few of my usual recreation activities because of pain in   my neck  Total 33/50 = 66%   Minimum Detectable Change (90% confidence): 5 points or 10% points  COGNITION: Overall cognitive status: Within functional limits for tasks assessed  SENSATION: Intermittent numbness/tingling in B hands (L>R)  POSTURE: rounded shoulders and forward head  PALPATION: Tightness noted to B UT, cervical paraspinals, suboccipitals  Tenderness noted but patient reports this is likely due to autoimmune disorder.   CERVICAL ROM:   Active ROM A/PROM (deg) eval  Flexion 25  Extension 28  Right lateral flexion 25  Left lateral flexion 15  Right rotation 10  Left rotation 20   (Blank rows = not tested)  UPPER EXTREMITY MMT:  MMT Right eval Left eval  Shoulder flexion 4 4  Shoulder extension    Shoulder abduction 4 4  Shoulder adduction    Shoulder extension    Shoulder internal rotation    Shoulder external rotation    Elbow flexion 4 4  Elbow extension 4 4  Wrist flexion    Wrist extension    Wrist ulnar deviation    Wrist radial deviation    Wrist pronation    Wrist supination      (Blank rows = not tested)  UPPER EXTREMITY ROM:  AROM WFL   CERVICAL SPECIAL TESTS:  Distraction test: Positive - decrease in L UE numbness/tingling    TREATMENT DATE: 09/06/2024                                                                                                                               Subjective: Patient reports 20/10 prior to start of the session. She reports repetitive lifting and carrying various items yesterday. She reports her knee and lower back are hurting today. She reports a migraine every day for the last two days, she has to take prescription in order to reduce symptoms.  No further questions or concerns.     Neuromuscular Re-education (with intent to improve cervical/scapular posture):  UBE - 6 min - 2.5 min Fwd, 2.5 min Retro - Level 10-2 for UE  strength and muscular endurance; PT manually adjusted resistance throughout to patient's tolerance.   Shoulder Row (cable)   2 x 12 - 20#  1 x 12 - 25#   Lat Pull Down (Cable)  3 x 10 - 25#   Manual Therapy (20 min billed):  Prone position (10 min) <> Supine (10 min)  Moderate to Light STM applied to right and left cervical paraspinals, levator scapulae, upper trapezius musculature to decrease muscle tension. Myofascial release, trigger point and contract and relax techniques utilized. Pt endorsed improvements in pain following intervention.   Suboccipital Release   30s/bout x 3 in order to improve muscle tension/tissue extensibility  Manual Upper Trapezius Stretch - Supine PT facilitated Scapular Depression  R/L: 30s/bout x 3 in order to improve muscle tension/tissue extensibility   Physical Performance Measure:  MMT (See goals below)   ROM Reassessment (see goals below)   PATIENT EDUCATION:  Education details: Exercise Technique  Person educated: Patient Education method: Explanation, Demonstration, and Handouts Education comprehension: verbalized understanding and returned  demonstration  HOME EXERCISE PROGRAM: Access Code: [email or web address] URL: [email or web address] Date: 08/10/2024 Prepared by: Lonni Pall  Exercises - Seated Upper Trapezius Stretch  - 2-3 x daily - 5-7 x weekly - 3-5 reps - 30-60 seconds hold - Seated Levator Scapulae Stretch  - 2-3 x daily - 5-7 x weekly - 3-5 reps - 30-60 second hold - Seated Shoulder Rolls  - 2-3 x daily - 5-7 x weekly - 1 sets - 10 reps - Supine Suboccipital Release with Tennis Balls  - 2-3 x daily - 5-7 x weekly - as needed --> 1-2 minutes  hold - Standing Shoulder Row with Anchored Resistance  - 1 x daily - 3-4 x weekly - 2-3 sets - 10-12 reps - Seated Scapular Retraction  - 1 x daily - 3-4 x weekly - 2-3 sets - 10-12 reps - Shoulder External Rotation and Scapular Retraction with Resistance  - 1 x daily - 3-4 x weekly - 2-3 sets - 10-12 reps - Prone Shoulder Horizontal Abduction with External Rotation  - 1 x daily - 3-4 x weekly - 2-3 sets - 10-12 reps - First Rib Mobilization with Strap  - 1 x daily - 7 x weekly - 2-3 sets - 10 reps - 3s hold  Access Code: [email or web address] URL: [email or web address] Date: 07/12/2024 Prepared by: Lonni Florentino Laabs  Exercises - Seated Upper Trapezius Stretch  - 2-3 x daily - 5-7 x weekly - 3-5 reps - 30-60 seconds hold - Seated Levator Scapulae Stretch  - 2-3 x daily - 5-7 x weekly - 3-5 reps - 30-60 second hold - Seated Shoulder Rolls  - 2-3 x daily - 5-7 x weekly - 1 sets - 10 reps - Supine Suboccipital Release with Tennis Balls  - 2-3 x daily - 5-7 x weekly - as needed --> 1-2 minutes  hold - Standing Shoulder Row with Anchored Resistance  - 1 x daily - 3-4 x weekly - 2-3 sets - 10-12 reps - Seated Scapular Retraction  - 1 x daily - 3-4 x weekly - 2-3 sets - 10-12 reps - Shoulder External Rotation and Scapular Retraction with Resistance  - 1 x daily - 3-4 x weekly - 2-3 sets - 10-12 reps   ASSESSMENT:  CLINICAL IMPRESSION: Patient arriving to OPPT for 10th  visit in this POC for management of cervicalgia. Patient has demonstrated significant improvements in cervical ROM, disability, and UE strength (see goals below). Self report of 25/50 on the NDI  indicating moderate disability to ADLs due to her neck pain. Her neck pain is still persistently severe and her HA frequency hasn't improved since the start of POC. She has demonstrated independence with all of her HEP exercises without verbal cues from PT however she doesn't perform all HEP exercises at 100% adherence. Patient to f/u with neurology to determine if ESI for cervical spine. PT POC remains appropriate and PT will continue focusing on postural retraining to decompress cervical spine.  Patient still has intermittent upper neck pain that limits her from full participation with recreational and community based activities. Based on today's performance, pt will continue to benefit from skilled PT in order to facilitate return to PLOF and improve QoL.    OBJECTIVE IMPAIRMENTS: decreased balance, decreased endurance, decreased mobility, difficulty walking, decreased ROM, decreased strength, hypomobility, impaired flexibility, impaired sensation, postural dysfunction, and pain.   ACTIVITY LIMITATIONS: carrying, lifting, squatting, stairs, transfers, bathing, and dressing  PARTICIPATION LIMITATIONS: cleaning, laundry, and community activity  PERSONAL FACTORS: Age, Behavior pattern, Past/current experiences, and Time since onset of injury/illness/exacerbation are also affecting patient's functional outcome.   REHAB POTENTIAL: Good  CLINICAL DECISION MAKING: Stable/uncomplicated  EVALUATION COMPLEXITY: Moderate   GOALS: Goals reviewed with patient? Yes  SHORT TERM GOALS: Target date: 07/20/2024  Patient will be independent in HEP to improve strength/mobility for better functional independence with ADLs. Baseline: 06/22/24: HEP initiated; 09/07/2023: 50% adherence     Goal status: Progressing    LONG TERM GOALS: Target date: 10/12/2024  Patient will reduce Neck Disability Index score to <10% to demonstrate minimal disability with ADL's including improved sleeping tolerance, sitting tolerance, etc for better mobility at home and work. Baseline: 06/22/24: 33/50 = 66%; 09/07/2023: 25 / 50 = 50.0 % Goal status: progressing  2.  Patient will report a worst pain of 3/10 on NRPS in neck to improve tolerance with ADLs and reduced symptoms with activities.  Baseline: 06/22/24: 10/10 worst; 09/06/2024:8/10 NPS Goal status: Progressing   3.  Patient will improve B UE strength by 1/3 MMT grade to demonstrate improvement in tolerance to activity and strength to perform ADLs independently.  Baseline: 06/22/24: see above; 09/07/2023: MMT Right eval Left eval R/L 09/06/2024  Shoulder flexion 4 4 4+/4+  Shoulder extension     Shoulder abduction 4 4 4+/4+  Shoulder adduction     Shoulder extension     Shoulder internal rotation     Shoulder external rotation     Elbow flexion 4 4 5/5  Elbow extension 4 4 5/5   Goal status: Partially Met  4.  Patient will improve cervical ROM in all directions by 10 degrees to improve ability to look in all directions while driving safely.  Baseline: 06/22/24: see above; 09/06/2024: Active ROM A/PROM (deg) eval AROM (deg)  Flexion 25 25  Extension 28 26  Right lateral flexion 25 25  Left lateral flexion 15 20  Right rotation 10 60  Left rotation 20 50     Goal status: Progressing  5.  Patient will report decrease in frequency of migraines to < 2 per week to demonstrate improvement in muscular tightness in cervical region and ability tolerate daily activities with reduction in pain.  Baseline: 06/22/24: daily; 08/04/24: 2-3x a week; 09/06/2024: 2-3x a week  Goal status: Progressing   PLAN:  PT FREQUENCY: 1-2x/week  PT DURATION: 8 weeks  PLANNED INTERVENTIONS: 97164- PT Re-evaluation, 97750- Physical Performance Testing, 97110-Therapeutic  exercises, 97530- Therapeutic activity, 97112- Neuromuscular re-education, 97535- Self Care, [id number]- Manual  therapy, [id number]- Electrical stimulation (unattended), [phone]- Electrical stimulation (manual), Patient/Family education, Joint mobilization, Joint manipulation, Spinal manipulation, Spinal mobilization, Cryotherapy, and Moist heat  PLAN FOR NEXT SESSION: HEP review, cervical distraction, manual therapy, cervicothoracic posture training  Lonni Pall PT, DPT Physical Therapist- [facility] 09/06/2024, 2:02 PM   "

## 2024-09-07 ENCOUNTER — Ambulatory Visit: Payer: Self-pay

## 2024-09-07 NOTE — Telephone Encounter (Signed)
 FYI Only or Action Required?: FYI only for provider: appointment scheduled on 09/08/24.  Patient was last seen in primary care on 08/22/2024 by Randeen Laine LABOR, MD.  Called Nurse Triage reporting Otitis Media.  Symptoms began 2.5 weeks ago.  Interventions attempted: OTC medication: ibuprofen  Prescription medications: prednisone ; amoxicillin .  Symptoms are: gradually worsening.  Triage Disposition: See Physician Within 24 Hours  Patient/caregiver understands and will follow disposition?: Yes                Copied from CRM [id number]. Topic: Clinical - Red Word Triage >> Sep 07, 2024  2:58 PM Carlyon D wrote: Red Word that prompted transfer to Nurse Triage: Right ear Pt can not hear out of it also has sharp pain, pain is also now moving into the left ear. Reason for Disposition  [1] Taking antibiotic > 72 hours (3 days) and [2] pain persists or recurs  Answer Assessment - Initial Assessment Questions 1. ANTIBIOTIC: What antibiotic are you taking? How many times per day?     Amoxicillin  500mg  bid x 7 days (completed) and prednisone  as well x 5 days.  2. ONSET: When was the antibiotic started?     08/22/24. Symptoms started 2.5 weeks ago.  3. LOCATION: Which ear is involved?     Right ear and now spreading to left ear as well.  4. PAIN: How bad is the pain?   (Scale 0-10; none, mild, moderate or severe)     8/10. Taking ibuprofen every other day for back pain.  5. FEVER: Do you have a fever? If Yes, ask: What is your temperature, how was it measured, and when did it start?     No.  6. DISCHARGE: Is there any discharge? If Yes, ask: What color is it? (e.g., clear, white; yellow, green; bloody)     No.  7. OTHER SYMPTOMS: Do you have any other symptoms? (e.g., headache, stiff neck, dizziness, vomiting, runny nose)     Decreased/muffled hearing in the right ear, moderate lightheaded (she states baseline she is unsteady on her feet due to autoimmune joint  problems), runny nose, nasal congestion. No nausea or vomiting.  Protocols used: Ear - Otitis Media Follow-up Call-A-[facility]

## 2024-09-07 NOTE — Telephone Encounter (Signed)
 Noted

## 2024-09-08 ENCOUNTER — Ambulatory Visit (INDEPENDENT_AMBULATORY_CARE_PROVIDER_SITE_OTHER): Payer: MEDICAID | Admitting: Primary Care

## 2024-09-08 ENCOUNTER — Encounter: Payer: Self-pay | Admitting: Primary Care

## 2024-09-08 VITALS — BP 126/68 | HR 62 | Temp 98.1°F | Ht 63.0 in | Wt 244.0 lb

## 2024-09-08 DIAGNOSIS — H9203 Otalgia, bilateral: Secondary | ICD-10-CM | POA: Diagnosis not present

## 2024-09-08 MED ORDER — PREDNISONE 20 MG PO TABS: ORAL_TABLET | ORAL | 0 refills | Status: DC

## 2024-09-08 NOTE — Assessment & Plan Note (Signed)
 Exam today without evidence of effusion or infection.  We discussed potential causes for her symptoms and agreed that ENT evaluation would be the next step. We also discussed to try another round of prednisone  at a higher dose, she agrees.  Start prednisone  20 mg tablets. Take 2 tablets by mouth once daily in the morning for 5 days. Referral placed to ENT.

## 2024-09-08 NOTE — Patient Instructions (Signed)
 You will either be contacted via phone regarding your referral to ENT, or you may receive a letter on your MyChart portal from our referral team with instructions for scheduling an appointment. Please let us  know if you have not been contacted by anyone within two weeks.  Start prednisone  20 mg tablets. Take 2 tablets by mouth once daily in the morning for 5 days.  It was a pleasure meeting you!

## 2024-09-08 NOTE — Progress Notes (Signed)
 "  Subjective:    Patient ID: Anita Gibbs, female    DOB: [birth year], 54 y.o.   MRN: [id number]  Anita Gibbs is a very pleasant 54 y.o. female patient of Leron, NP with a history of hypertension, seasonal asthma, allergic rhinitis, frontal sinusitis, OSA, acute otitis media who presents today to discuss otalgia.  Evaluated by Dr. Randeen on 08/22/2024 for a 3-day history of right otalgia.  She also noted decreased sense of hearing with mild cough and a fever.  She was diagnosed with serous otitis media and was treated with amoxicillin  500 mg twice daily x 7 days and prednisone  20 mg daily x 5 days.  Today she discusses that she completed her course of prednisone  and amoxicillin  and has not found improvement in her symptoms.  Her pain remains present to the right ear but is now present to the left ear. Her pain is sharp to bilateral ears with a rumbling and pressure to the right ear. She's also noticed rhinorrhea and lightheadedness.   She's using Flonase  daily and taking Zyrtec  daily.  She denies cough, fevers, headaches, congestion.   Review of Systems  Constitutional:  Negative for chills and fever.  HENT:  Positive for ear pain. Negative for congestion, sinus pressure and sore throat.   Respiratory:  Negative for cough.   Neurological:  Positive for light-headedness.         Past Medical History:  Diagnosis Date   Abdominal pain 06/11/2020   Abnormal drug screen 03/06/2020   Abnormal MRI, cervical spine (06/10/2018) 09/27/2018   FINDINGS:  Vertebrae: fusion hardware at C4, C5, C6, and C7.  Posterior Fossa, vertebral arteries, paraspinal tissues: A relatively empty sella present.     Disc levels:  C3-4: Negative. A mild broad-based disc osteophyte complex present. Uncovertebral spurring contributes to mild foraminal narrowing bilaterally.  C4-5: A leftward disc osteophyte complex is present. Uncovertebral and facet disease   Acute cystitis without hematuria 06/18/2020   Acute  vaginitis 06/11/2020   Adenoma of left adrenal gland 08/01/2020   Adrenal nodule 06/20/2020   Allergy     ANA positive 02/01/2021   Anxiety and depression 01/29/2021   Arthritis    Back pain with history of spinal surgery 01/29/2021   Bilateral carpal tunnel syndrome 01/29/2021   Bilateral leg edema 01/29/2021   BMI 40.0-44.9, adult (HCC) 04/30/2022   Carpal boss, right    Carpal tunnel syndrome, bilateral    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) 09/27/2018   Levels:  C4-5: There is partial effacement of ventral CSF.     IMPRESSION:  Mild residual central canal narrowing at C4-5 s/p ACDF.   Cervical facet hypertrophy (C3-T1) 09/27/2018   Levels:  C3-4: Uncovertebral spurring  C4-5: Uncovertebral and facet disease  C5-6: Residual uncovertebral disease.  C6-7: Residual uncovertebral disease.  C7-T1: Minimal left-sided uncovertebral spurring   Cervical facet joint syndrome (Bilateral) (L>R) 09/27/2018   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-5, C5-6, and C6-7) 09/27/2018   Levels:  C3-4: Mild foraminal narrowing bilaterally.  C4-5: Mild left foraminal narrowing.  C5-6: Mild left foraminal narrowing is due to residual uncovertebral disease.  C6-7: Mild left foraminal narrowing is due to residual uncovertebral disease.   Cervical myelopathy (HCC) 07/01/2017   Cervicalgia (Primary Area of Pain) (Bilateral) (L>R) 09/27/2018   Chronic gout of foot (Left) 05/31/2018   Chronic low back pain [facility] Area of Pain) (Bilateral) (R>L) w/ sciatica (Bilateral) 09/07/2018   Chronic low back pain [facility] Area of Pain) (Bilateral) (  R>L) w/o sciatica 07/26/2018   Chronic lower extremity pain (Fourth Area of Pain) (Bilateral) (R>L) 09/07/2018   Chronic musculoskeletal pain 10/27/2018   Chronic neck pain (Bilateral) w/ history of cervical spinal surgery 09/27/2018   Chronic neck pain (Primary Area of Pain) (Bilateral) (L>R) 09/07/2018   Chronic sacroiliac joint dysfunction (Bilateral)  09/27/2018   Chronic sacroiliac joint pain (Right) 09/07/2018   Chronic upper extremity pain (Secondary Area of Pain) (Bilateral) (R>L) 09/27/2018   Disorder of skeletal system 09/07/2018   E. coli UTI (urinary tract infection) 06/20/2020   Elevated sed rate 09/08/2018   Elevated serum creatinine 06/20/2020   Excessive daytime sleepiness 03/06/2020   Female pelvic pain 09/05/2020   GERD (gastroesophageal reflux disease)    Headache    History of allergy  to shellfish 09/28/2018   History of fusion of cervical spine (ACDF C4-C7) 09/27/2018   History of illicit drug use 03/07/2020   Hives 09/05/2020   Hives 09/05/2020   Hyperkalemia 05/31/2018   Mild   Hyperlipidemia 04/30/2022   Hypertension    Large breasts 05/30/2020   Left hip pain 07/21/2022   Lumbar facet arthropathy 04/20/2019   Lumbar radiculitis (L5 dermatome) (Right) 04/17/2020   Multiple environmental allergies 01/17/2020   Multiple food allergies 03/07/2020   Neurogenic pain 10/27/2018   Numbness and tingling of both feet 05/31/2018   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   Other intervertebral disc degeneration, lumbar region 11/15/2019   Pharmacologic therapy 09/07/2018   Plantar fasciitis, bilateral 03/04/2021   Polyneuropathy 01/29/2021   Prediabetes 10/10/2019   Problems influencing health status 09/07/2018   Rash 08/08/2019   Seasonal allergic rhinitis due to pollen 11/29/2018   Seasonal allergies 03/06/2020   Seasonal asthma 11/29/2018   Sjogren's disease    Sleep apnea    Somatic dysfunction of sacroiliac joint (Bilateral) 09/27/2018   Spondylosis without myelopathy or radiculopathy, cervical region 03/22/2019   Spondylosis without myelopathy or radiculopathy, lumbosacral region 05/19/2019   Spondylosis, cervical, w/ myelopathy 09/27/2018   URI with cough and congestion 06/16/2024   Vaginal discharge 06/11/2020    Social History   Socioeconomic History   Marital status: Widowed     Spouse name: Not on file   Number of children: 2   Years of education: Not on file   Highest education level: Associate degree: occupational, scientist, product/process development, or vocational program  Occupational History   Not on file  Tobacco Use   Smoking status: Never   Smokeless tobacco: Never  Vaping Use   Vaping status: Never Used  Substance and Sexual Activity   Alcohol use: Yes    Alcohol/week: 2.0 standard drinks of alcohol    Types: 2 Glasses of wine per week    Comment: occ   Drug use: No   Sexual activity: Yes    Birth control/protection: None  Other Topics Concern   Not on file  Social History Narrative   Bus driver    And school custodian    1 daughter lives in [place] 41 y.o as of 01/17/20   1 son 24 almost 65 in [place]   [employer]    Social Drivers of Health   Tobacco Use: Low Risk (09/08/2024)   Patient History    Smoking Tobacco Use: Never    Smokeless Tobacco Use: Never    Passive Exposure: Not on file  Financial Resource Strain: High Risk (06/15/2024)   Overall Financial Resource Strain (CARDIA)    Difficulty of Paying Living Expenses: Very hard  Food Insecurity: Food Insecurity Present (06/15/2024)   Epic    Worried About Programme Researcher, Broadcasting/film/video in the Last Year: Often true    Ran Out of Food in the Last Year: Often true  Transportation Needs: No Transportation Needs (06/15/2024)   Epic    Lack of Transportation (Medical): No    Lack of Transportation (Non-Medical): No  Physical Activity: Inactive (06/15/2024)   Exercise Vital Sign    Days of Exercise per Week: 0 days    Minutes of Exercise per Session: Not on file  Stress: Stress Concern Present (06/15/2024)   [employer] of Occupational Health - Occupational Stress Questionnaire    Feeling of Stress: Rather much  Social Connections: Socially Isolated (06/15/2024)   Social Connection and Isolation Panel    Frequency of Communication with Friends and Family: Twice a week    Frequency of  Social Gatherings with Friends and Family: Never    Attends Religious Services: More than 4 times per year    Active Member of [employer] or Organizations: No    Attends Banker Meetings: Not on file    Marital Status: Widowed  Intimate Partner Violence: Not on file  Depression (PHQ2-9): Medium Risk (09/08/2024)   Depression (PHQ2-9)    PHQ-2 Score: 9  Alcohol Screen: Low Risk (06/15/2024)   Alcohol Screen    Last Alcohol Screening Score (AUDIT): 2  Housing: Low Risk (06/15/2024)   Epic    Unable to Pay for Housing in the Last Year: No    Number of Times Moved in the Last Year: 0    Homeless in the Last Year: No  Utilities: At Risk (06/07/2024)   Received from [facility]   Epic    In the past 12 months has the electric, gas, oil, or water company threatened to shut off services in your home?: Yes  Health Literacy: Adequate Health Literacy (09/28/2023)   Received from [facility]   B1300 Health Literacy    Frequency of need for help with medical instructions: Never    Past Surgical History:  Procedure Laterality Date   ABDOMINAL HYSTERECTOMY  10/17/2020   [facility]; she has both ovaries   ANTERIOR CERVICAL DECOMP/DISCECTOMY FUSION N/A 07/01/2017   Procedure: ANTERIOR CERVICAL DECOMPRESSION/DISCECTOMY FUSION 3 LEVELS;  Surgeon: Clois Fret, MD;  Location: [facility] ORS;  Service: Neurosurgery;  Laterality: N/A;   CARDIAC CATHETERIZATION     CARPAL TUNNEL RELEASE Bilateral    CHOLECYSTECTOMY     KNEE ARTHROSCOPY Right    TUBAL LIGATION  03/10/96    Family History  Problem Relation Age of Onset   [name] Mother    Diabetes Mother    Arthritis Mother    Hypertension Mother    Hyperlipidemia Mother    Hypertension Father    Arthritis Father    Hearing loss Father    Kidney disease Father    Lymphoma Father        large b cell    Arthritis Sister    Hypertension Sister    Cancer Brother        ? lung due to exposure    COPD  Brother    Early death Brother    Post-traumatic stress disorder Brother    Breast cancer Maternal Aunt    Breast cancer Paternal Aunt    Sleep apnea Daughter    COPD Son    Depression Son    Post-traumatic stress disorder Son    Allergic rhinitis Neg  Hx    Angioedema Neg Hx    Eczema Neg Hx    Urticaria Neg Hx    Asthma Neg Hx     Allergies[1]  Medications Ordered Prior to Encounter[2]  BP 126/68   Pulse 62   Temp 98.1 F (36.7 C) (Oral)   Ht 5' 3 (1.6 m)   Wt 244 lb (110.7 kg)   LMP  (LMP Unknown)   SpO2 96%   BMI 43.22 kg/m  Objective:   Physical Exam HENT:     Right Ear: Tympanic membrane and ear canal normal.     Left Ear: Tympanic membrane and ear canal normal.  Cardiovascular:     Rate and Rhythm: Normal rate.  Pulmonary:     Effort: Pulmonary effort is normal.  Musculoskeletal:     Cervical back: Neck supple.  Skin:    General: Skin is warm and dry.  Neurological:     Mental Status: She is alert and oriented to person, place, and time.  Psychiatric:        Mood and Affect: Mood normal.     Physical Exam        Assessment & Plan:  Acute otalgia, bilateral Assessment & Plan: Exam today without evidence of effusion or infection.  We discussed potential causes for her symptoms and agreed that ENT evaluation would be the next step. We also discussed to try another round of prednisone  at a higher dose, she agrees.  Start prednisone  20 mg tablets. Take 2 tablets by mouth once daily in the morning for 5 days. Referral placed to ENT.  Orders: -     predniSONE ; Take 2 tablets by mouth once daily in the morning for 5 days.  Dispense: 10 tablet; Refill: 0 -     Ambulatory referral to ENT    Assessment and Plan Assessment & Plan         Comer MARLA Gaskins, NP       [1]  Allergies Allergen Reactions   Other Hives    White and red sauces   Pineapple     Swelling  Other Reaction(s): Not available  pineapple allergenic extract    Pineapple Extract Swelling    pineapple extract   Shellfish Allergy  Hives   Strawberry (Diagnostic) Hives   Tomato Hives    cherry  Solanum (organism)  [2]  Current Outpatient Medications on File Prior to Visit  Medication Sig Dispense Refill   albuterol  (VENTOLIN  HFA) 108 (90 Base) MCG/ACT inhaler Inhale 2 puffs into the lungs every 6 (six) hours as needed for wheezing or shortness of breath. 8 g 11   amLODipine  (NORVASC ) 5 MG tablet Take 1 tablet (5 mg total) by mouth daily. 90 tablet 0   aspirin 81 MG chewable tablet Chew 81 mg by mouth daily.     atorvastatin  (LIPITOR) 80 MG tablet Take 80 mg by mouth daily.     busPIRone  (BUSPAR ) 10 MG tablet Take 2 tablets (20 mg total) by mouth 2 (two) times daily.     butalbital -acetaminophen -caffeine  (FIORICET) 50-325-40 MG tablet Take 1 tablet by mouth every 6 (six) hours as needed for headache. 30 tablet 0   cetirizine  (ZYRTEC  ALLERGY ) 10 MG tablet Take 1 tablet (10 mg total) by mouth at bedtime as needed. 90 tablet 3   Cholecalciferol (VITAMIN D -3) 125 MCG (5000 UT) TABS Take by mouth daily.     Cyanocobalamin  (B-12 PO) Take by mouth.     dicyclomine  (BENTYL ) 20 MG tablet TAKE 1 TABLET  BY MOUTH FOUR TIMES DAILY (BEFORE MEALS AND AT BEDTIME) 120 tablet 5   DULoxetine  (CYMBALTA ) 60 MG capsule Take 1 capsule (60 mg total) by mouth 2 (two) times daily. 60 capsule 2   ENBREL SURECLICK 50 MG/ML injection Inject into the skin.     EPINEPHrine  (AUVI-Q ) 0.3 mg/0.3 mL IJ SOAJ injection Use as directed for severe allergic reaction 2 Device 2   famotidine  (PEPCID ) 20 MG tablet Take 1 tablet (20 mg total) by mouth 2 (two) times daily. 60 tablet 0   fluticasone  (FLONASE ) 50 MCG/ACT nasal spray Place 2 sprays into both nostrils daily. 16 g 6   hydrochlorothiazide  (HYDRODIURIL ) 25 MG tablet      hydrocortisone  2.5 % cream Apply topically 2 (two) times daily. Prn to eyelids 30 g 0   hydrOXYzine  (VISTARIL ) 25 MG capsule Take 1 capsule (25 mg total) by mouth  3 (three) times daily. 90 capsule 2   isosorbide  mononitrate (IMDUR ) 30 MG 24 hr tablet Take 0.5 tablets (15 mg total) by mouth daily. 60 tablet 3   Melatonin 10 MG CAPS Take 10 mg by mouth at bedtime. 30 capsule 6   methocarbamol  (ROBAXIN ) 750 MG tablet Take 1 tablet (750 mg total) by mouth every 8 (eight) hours as needed for muscle spasms. 90 tablet 11   montelukast  (SINGULAIR ) 10 MG tablet Take 1 tablet (10 mg total) by mouth at bedtime. 90 tablet 3   ondansetron  (ZOFRAN -ODT) 4 MG disintegrating tablet Take 1 tablet (4 mg total) by mouth every 8 (eight) hours as needed for nausea or vomiting. 20 tablet 0   pantoprazole  (PROTONIX ) 40 MG tablet TAKE ONE TABLET EVERY DAY 30 MIN BEFORE DINNER 90 tablet 3   polyethylene glycol powder (GLYCOLAX /MIRALAX ) 17 GM/SCOOP powder Take 17 g by mouth 2 (two) times daily as needed. 3350 g 1   potassium chloride  SA (KLOR-CON  M) 20 MEQ tablet Take 1 tablet (20 mEq total) by mouth daily as needed. With lasix  30 tablet 0   pregabalin  (LYRICA ) 100 MG capsule Take 1 capsule (100 mg total) by mouth 3 (three) times daily. 90 capsule 2   saccharomyces boulardii (FLORASTOR) 250 MG capsule Take 1 capsule (250 mg total) by mouth daily. 90 capsule 0   traMADol  (ULTRAM -ER) 100 MG 24 hr tablet Take 1 tablet (100 mg total) by mouth daily. 30 tablet 2   traZODone  (DESYREL ) 100 MG tablet Take 1 tablet (100 mg total) by mouth at bedtime. 30 tablet 3   triamcinolone  cream (KENALOG ) 0.1 % Apply 1 Application topically 2 (two) times daily. 30 g 0   No current facility-administered medications on file prior to visit.   "

## 2024-09-09 NOTE — Progress Notes (Unsigned)
 "  Referring Physician:  Gretel App, NP [address] [place],  [place] [address]  Primary Physician:  Gretel App, NP  History of Present Illness: [name] has a history of HTN, asthma, GERD, chronic pain syndrome, hyperlipidemia, obesity.   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 Clois.   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    Cervical MRI showed moderate left/mild right foraminal stenosis C3-C4 along with moderate left foraminal stenosis C4-C5. Thoracic MRI showed no central or foraminal stenosis. Note made of hemangioma at T12 and L2 along with left renal cyst.    EMG of upper extremities was normal.   She had to do PT prior to insurance approving cervical injections (if no help with these, Dr. Clois recommended cervical SCS). This was ordered at her last visit.   She had initial PT evaluation on 06/22/24 and did 9 visits through 09/06/24.   She is here for follow up.   She had maybe 20% improvement with PT. She continues with constant neck pain radiating down left arm to the hand. She has numbness and tingling in both hands and left > right arm. She has weakness in both hands. She has occasional pain in right arm.    She notes she is dropping things and that her balance is off- she has chronic balance issues but thinks things are worse.   Seeing ENT for right ear infection that she's had for 3 weeks.   She takes cymbalta , motrin, robaxin , ultram , and lyrica .   She does not smoke.   Bowel/Bladder Dysfunction: none  Conservative measures:  Physical therapy:  has not participated in PT for neck, is in PT for her back now Multimodal medical therapy including regular antiinflammatories:  Cymbalta , Methocarbamol , Lyrica , Meloxicam  Injections:  10/06/2022, 05/18/2023 L3-4 and L4-5 Radiofrequency Ablation 05/19/2022, 07/07/2022 L3-4, L4-5 Medial Branch Block  Past Surgery:  07/01/2017 C4-C7 ACDF by  Dr. Clois  The symptoms are causing a significant impact on the patient's life.   Review of Systems:  A 10 point review of systems is negative, except for the pertinent positives and negatives detailed in the HPI.  Past Medical History: Past Medical History:  Diagnosis Date   Abdominal pain 06/11/2020   Abnormal drug screen 03/06/2020   Abnormal MRI, cervical spine (06/10/2018) 09/27/2018   FINDINGS:  Vertebrae: fusion hardware at C4, C5, C6, and C7.  Posterior Fossa, vertebral arteries, paraspinal tissues: A relatively empty sella present.     Disc levels:  C3-4: Negative. A mild broad-based disc osteophyte complex present. Uncovertebral spurring contributes to mild foraminal narrowing bilaterally.  C4-5: A leftward disc osteophyte complex is present. Uncovertebral and facet disease   Acute cystitis without hematuria 06/18/2020   Acute vaginitis 06/11/2020   Adenoma of left adrenal gland 08/01/2020   Adrenal nodule 06/20/2020   Allergy     ANA positive 02/01/2021   Anxiety and depression 01/29/2021   Arthritis    Back pain with history of spinal surgery 01/29/2021   Bilateral carpal tunnel syndrome 01/29/2021   Bilateral leg edema 01/29/2021   BMI 40.0-44.9, adult (HCC) 04/30/2022   Carpal boss, right    Carpal tunnel syndrome, bilateral    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) 09/27/2018   Levels:  C4-5: There is partial effacement of ventral CSF.     IMPRESSION:  Mild residual central canal narrowing at C4-5 s/p ACDF.   Cervical facet hypertrophy (C3-T1)  09/27/2018   Levels:  C3-4: Uncovertebral spurring  C4-5: Uncovertebral and facet disease  C5-6: Residual uncovertebral disease.  C6-7: Residual uncovertebral disease.  C7-T1: Minimal left-sided uncovertebral spurring   Cervical facet joint syndrome (Bilateral) (L>R) 09/27/2018   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-5, C5-6, and C6-7) 09/27/2018   Levels:  C3-4: Mild foraminal narrowing bilaterally.  C4-5:  Mild left foraminal narrowing.  C5-6: Mild left foraminal narrowing is due to residual uncovertebral disease.  C6-7: Mild left foraminal narrowing is due to residual uncovertebral disease.   Cervical myelopathy (HCC) 07/01/2017   Cervicalgia (Primary Area of Pain) (Bilateral) (L>R) 09/27/2018   Chronic gout of foot (Left) 05/31/2018   Chronic low back pain [facility] Area of Pain) (Bilateral) (R>L) w/ sciatica (Bilateral) 09/07/2018   Chronic low back pain [facility] Area of Pain) (Bilateral) (R>L) w/o sciatica 07/26/2018   Chronic lower extremity pain (Fourth Area of Pain) (Bilateral) (R>L) 09/07/2018   Chronic musculoskeletal pain 10/27/2018   Chronic neck pain (Bilateral) w/ history of cervical spinal surgery 09/27/2018   Chronic neck pain (Primary Area of Pain) (Bilateral) (L>R) 09/07/2018   Chronic sacroiliac joint dysfunction (Bilateral) 09/27/2018   Chronic sacroiliac joint pain (Right) 09/07/2018   Chronic upper extremity pain (Secondary Area of Pain) (Bilateral) (R>L) 09/27/2018   Disorder of skeletal system 09/07/2018   E. coli UTI (urinary tract infection) 06/20/2020   Elevated sed rate 09/08/2018   Elevated serum creatinine 06/20/2020   Excessive daytime sleepiness 03/06/2020   Female pelvic pain 09/05/2020   GERD (gastroesophageal reflux disease)    Headache    History of allergy  to shellfish 09/28/2018   History of fusion of cervical spine (ACDF C4-C7) 09/27/2018   History of illicit drug use 03/07/2020   Hives 09/05/2020   Hives 09/05/2020   Hyperkalemia 05/31/2018   Mild   Hyperlipidemia 04/30/2022   Hypertension    Large breasts 05/30/2020   Left hip pain 07/21/2022   Lumbar facet arthropathy 04/20/2019   Lumbar radiculitis (L5 dermatome) (Right) 04/17/2020   Multiple environmental allergies 01/17/2020   Multiple food allergies 03/07/2020   Neurogenic pain 10/27/2018   Numbness and tingling of both feet 05/31/2018   Osteoarthritis of sacroiliac joint (Bilateral)  09/27/2018   Other intervertebral disc degeneration, lumbar region 11/15/2019   Pharmacologic therapy 09/07/2018   Plantar fasciitis, bilateral 03/04/2021   Polyneuropathy 01/29/2021   Prediabetes 10/10/2019   Problems influencing health status 09/07/2018   Rash 08/08/2019   Seasonal allergic rhinitis due to pollen 11/29/2018   Seasonal allergies 03/06/2020   Seasonal asthma 11/29/2018   Sjogren's disease    Sleep apnea    Somatic dysfunction of sacroiliac joint (Bilateral) 09/27/2018   Spondylosis without myelopathy or radiculopathy, cervical region 03/22/2019   Spondylosis without myelopathy or radiculopathy, lumbosacral region 05/19/2019   Spondylosis, cervical, w/ myelopathy 09/27/2018   URI with cough and congestion 06/16/2024   Vaginal discharge 06/11/2020    Past Surgical History: Past Surgical History:  Procedure Laterality Date   ABDOMINAL HYSTERECTOMY  10/17/2020   [facility]; she has both ovaries   ANTERIOR CERVICAL DECOMP/DISCECTOMY FUSION N/A 07/01/2017   Procedure: ANTERIOR CERVICAL DECOMPRESSION/DISCECTOMY FUSION 3 LEVELS;  Surgeon: Clois Fret, MD;  Location: [facility] ORS;  Service: Neurosurgery;  Laterality: N/A;   CARDIAC CATHETERIZATION     CARPAL TUNNEL RELEASE Bilateral    CHOLECYSTECTOMY     KNEE ARTHROSCOPY Right    TUBAL LIGATION  03/10/96    Allergies: Allergies as of 09/13/2024 - Review Complete 09/08/2024  Allergen  Reaction Noted   Other Hives 06/25/2017   Pineapple  01/29/2021   Pineapple extract Swelling 01/29/2021   Shellfish allergy  Hives 06/25/2017   Strawberry (diagnostic) Hives 06/25/2017   Tomato Hives 06/25/2017    Medications: Outpatient Encounter Medications as of 09/13/2024  Medication Sig   albuterol  (VENTOLIN  HFA) 108 (90 Base) MCG/ACT inhaler Inhale 2 puffs into the lungs every 6 (six) hours as needed for wheezing or shortness of breath.   amLODipine  (NORVASC ) 5 MG tablet Take 1 tablet (5 mg total) by mouth daily.   aspirin 81 MG  chewable tablet Chew 81 mg by mouth daily.   atorvastatin  (LIPITOR) 80 MG tablet Take 80 mg by mouth daily.   busPIRone  (BUSPAR ) 10 MG tablet Take 2 tablets (20 mg total) by mouth 2 (two) times daily.   butalbital -acetaminophen -caffeine  (FIORICET) 50-325-40 MG tablet Take 1 tablet by mouth every 6 (six) hours as needed for headache.   cetirizine  (ZYRTEC  ALLERGY ) 10 MG tablet Take 1 tablet (10 mg total) by mouth at bedtime as needed.   Cholecalciferol (VITAMIN D -3) 125 MCG (5000 UT) TABS Take by mouth daily.   Cyanocobalamin  (B-12 PO) Take by mouth.   dicyclomine  (BENTYL ) 20 MG tablet TAKE 1 TABLET BY MOUTH FOUR TIMES DAILY (BEFORE MEALS AND AT BEDTIME)   DULoxetine  (CYMBALTA ) 60 MG capsule Take 1 capsule (60 mg total) by mouth 2 (two) times daily.   ENBREL SURECLICK 50 MG/ML injection Inject into the skin.   EPINEPHrine  (AUVI-Q ) 0.3 mg/0.3 mL IJ SOAJ injection Use as directed for severe allergic reaction   famotidine  (PEPCID ) 20 MG tablet Take 1 tablet (20 mg total) by mouth 2 (two) times daily.   fluticasone  (FLONASE ) 50 MCG/ACT nasal spray Place 2 sprays into both nostrils daily.   hydrochlorothiazide  (HYDRODIURIL ) 25 MG tablet    hydrocortisone  2.5 % cream Apply topically 2 (two) times daily. Prn to eyelids   hydrOXYzine  (VISTARIL ) 25 MG capsule Take 1 capsule (25 mg total) by mouth 3 (three) times daily.   isosorbide  mononitrate (IMDUR ) 30 MG 24 hr tablet Take 0.5 tablets (15 mg total) by mouth daily.   Melatonin 10 MG CAPS Take 10 mg by mouth at bedtime.   methocarbamol  (ROBAXIN ) 750 MG tablet Take 1 tablet (750 mg total) by mouth every 8 (eight) hours as needed for muscle spasms.   montelukast  (SINGULAIR ) 10 MG tablet Take 1 tablet (10 mg total) by mouth at bedtime.   ondansetron  (ZOFRAN -ODT) 4 MG disintegrating tablet Take 1 tablet (4 mg total) by mouth every 8 (eight) hours as needed for nausea or vomiting.   pantoprazole  (PROTONIX ) 40 MG tablet TAKE ONE TABLET EVERY DAY 30 MIN BEFORE  DINNER   polyethylene glycol powder (GLYCOLAX /MIRALAX ) 17 GM/SCOOP powder Take 17 g by mouth 2 (two) times daily as needed.   potassium chloride  SA (KLOR-CON  M) 20 MEQ tablet Take 1 tablet (20 mEq total) by mouth daily as needed. With lasix    pregabalin  (LYRICA ) 100 MG capsule Take 1 capsule (100 mg total) by mouth 3 (three) times daily.   saccharomyces boulardii (FLORASTOR) 250 MG capsule Take 1 capsule (250 mg total) by mouth daily.   traMADol  (ULTRAM -ER) 100 MG 24 hr tablet Take 1 tablet (100 mg total) by mouth daily.   traZODone  (DESYREL ) 100 MG tablet Take 1 tablet (100 mg total) by mouth at bedtime.   triamcinolone  cream (KENALOG ) 0.1 % Apply 1 Application topically 2 (two) times daily.   [DISCONTINUED] predniSONE  (DELTASONE ) 20 MG tablet Take 2 tablets by  mouth once daily in the morning for 5 days.   No facility-administered encounter medications on file as of 09/13/2024.    Social History: Social History   Tobacco Use   Smoking status: Never   Smokeless tobacco: Never  Vaping Use   Vaping status: Never Used  Substance Use Topics   Alcohol use: Yes    Alcohol/week: 2.0 standard drinks of alcohol    Types: 2 Glasses of wine per week    Comment: occ   Drug use: No    Family Medical History: Family History  Problem Relation Age of Onset   [name] Mother    Diabetes Mother    Arthritis Mother    Hypertension Mother    Hyperlipidemia Mother    Hypertension Father    Arthritis Father    Hearing loss Father    Kidney disease Father    Lymphoma Father        large b cell    Arthritis Sister    Hypertension Sister    Cancer Brother        ? lung due to exposure    COPD Brother    Early death Brother    Post-traumatic stress disorder Brother    Breast cancer Maternal Aunt    Breast cancer Paternal Aunt    Sleep apnea Daughter    COPD Son    Depression Son    Post-traumatic stress disorder Son    Allergic rhinitis Neg Hx    Angioedema Neg Hx    Eczema Neg Hx     Urticaria Neg Hx    Asthma Neg Hx     Physical Examination: Vitals:   09/13/24 0927  BP: 128/70      Awake, alert, oriented to person, place, and time.  Speech is clear and fluent. Fund of knowledge is appropriate.   Cranial Nerves: Pupils equal round and reactive to light.  Facial tone is symmetric.    Well healed cervical incision.   No abnormal lesions on exposed skin.   Strength: Side Biceps Triceps Deltoid Interossei Grip Wrist Ext. Wrist Flex.  R 5 5 5 5 5 5 5   L 5 5 5 5 5 5 5     Reflexes are 3+ and symmetric at the biceps, brachioradialis.  Hoffman's is negative bilaterally.   Bilateral upper extremity sensation is intact to light touch.     Gait is slow. She ambulates with a cane.   Medical Decision Making  Imaging: none  Assessment and Plan: Anita Gibbs is s/p C4-C7 ACDF on 07/01/17 by Dr. Clois.   She had maybe 20% improvement with PT. She continues with constant neck pain radiating down left arm to the hand. She has numbness and tingling in both hands and left > right arm. She has weakness in both hands. She has occasional pain in right arm.   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    MRI shows moderate left/mild right foraminal stenosis C3-C4 along with moderate left foraminal stenosis C4-C5.   EMG of upper extremities was normal.   Thoracic MRI shows no central or foraminal stenosis. Note made of hemangioma at T12 and L2 along with left renal cyst.    Treatment options discussed with patient and following plan made:   - Message to Dr. Marcelino- she would like to get scheduled for cervical injections. Would consider SCS if no improvement with them.  - She is seeing neurology as well (  [facility]).  - She will f/u with me prn.   I spent a total of 15 minutes in face-to-face and non-face-to-face activities related to this patient's care today including review of outside records, review of imaging, review of  symptoms, physical exam, discussion of differential diagnosis, discussion of treatment options, and documentation.   Glade Boys PA-C Dept. of Neurosurgery  "

## 2024-09-13 ENCOUNTER — Ambulatory Visit: Payer: MEDICAID

## 2024-09-13 ENCOUNTER — Ambulatory Visit: Payer: MEDICAID | Admitting: Orthopedic Surgery

## 2024-09-13 ENCOUNTER — Encounter: Payer: Self-pay | Admitting: Orthopedic Surgery

## 2024-09-13 VITALS — BP 128/70 | Ht 63.0 in | Wt 242.4 lb

## 2024-09-13 DIAGNOSIS — M5031 Other cervical disc degeneration,  high cervical region: Secondary | ICD-10-CM | POA: Diagnosis not present

## 2024-09-13 DIAGNOSIS — R292 Abnormal reflex: Secondary | ICD-10-CM

## 2024-09-13 DIAGNOSIS — M5412 Radiculopathy, cervical region: Secondary | ICD-10-CM

## 2024-09-13 DIAGNOSIS — M4802 Spinal stenosis, cervical region: Secondary | ICD-10-CM

## 2024-09-13 DIAGNOSIS — R200 Anesthesia of skin: Secondary | ICD-10-CM

## 2024-09-13 DIAGNOSIS — M47812 Spondylosis without myelopathy or radiculopathy, cervical region: Secondary | ICD-10-CM

## 2024-09-15 ENCOUNTER — Ambulatory Visit: Payer: MEDICAID

## 2024-09-16 ENCOUNTER — Ambulatory Visit: Payer: MEDICAID | Admitting: Nurse Practitioner

## 2024-09-20 ENCOUNTER — Telehealth: Payer: Self-pay

## 2024-09-20 ENCOUNTER — Ambulatory Visit: Payer: MEDICAID

## 2024-09-20 NOTE — Telephone Encounter (Signed)
 Called patient regarding missed appt. She reports that she forgot the appointment. She will show up for the next appointment.   Lonni Pall PT, DPT Physical Therapist- [place]

## 2024-09-22 ENCOUNTER — Ambulatory Visit: Payer: MEDICAID | Admitting: Student in an Organized Health Care Education/Training Program

## 2024-09-22 ENCOUNTER — Encounter: Payer: Self-pay | Admitting: Student in an Organized Health Care Education/Training Program

## 2024-09-22 VITALS — BP 135/58 | HR 64 | Temp 97.2°F | Resp 17 | Ht 63.0 in | Wt 232.0 lb

## 2024-09-22 DIAGNOSIS — M792 Neuralgia and neuritis, unspecified: Secondary | ICD-10-CM | POA: Diagnosis present

## 2024-09-22 DIAGNOSIS — M5412 Radiculopathy, cervical region: Secondary | ICD-10-CM | POA: Insufficient documentation

## 2024-09-22 DIAGNOSIS — M5481 Occipital neuralgia: Secondary | ICD-10-CM | POA: Diagnosis present

## 2024-09-22 DIAGNOSIS — Z981 Arthrodesis status: Secondary | ICD-10-CM | POA: Diagnosis present

## 2024-09-22 NOTE — Progress Notes (Signed)
 PROVIDER NOTE: Interpretation of information contained herein should be left to medically-trained personnel. Specific patient instructions are provided elsewhere under Patient Instructions section of medical record. This document was created in part using AI and STT-dictation technology, any transcriptional errors that may result from this process are unintentional.  Patient: Anita Gibbs  Service: E/M   PCP: Gretel App, NP  DOB: 06-Jun-1971  DOS: 09/22/2024  Provider: Wallie Sherry, MD  MRN: [id number]  Delivery: Face-to-face  Specialty: Interventional Pain Management  Type: Established Patient  Setting: Ambulatory outpatient facility  Specialty designation: 09  Referring Prov.: Gretel App, NP  Location: Outpatient office facility       History of present illness (HPI) Anita Gibbs, a 54 y.o. year old female, is here today because of her Cervical radicular pain [M54.12]. Anita Gibbs primary complain today is Neck Pain   Pain Assessment: Severity of Chronic pain is reported as a 7 /10. Location: Neck Left, Posterior/down arms bilat to fingers - fingers numb and tingling; muscle spasms in left arm. Onset: More than a month ago. Quality: Numbness, Tingling, Sharp, Stabbing. Timing: Constant. Modifying factor(s): heat, PT, meds. Vitals:  height is 5' 3 (1.6 m) and weight is 232 lb (105.2 kg). Her temperature is 97.2 F (36.2 C) (abnormal). Her blood pressure is 135/58 (abnormal) and her pulse is 64. Her respiration is 17 and oxygen saturation is 100%.  BMI: Estimated body mass index is 41.1 kg/m as calculated from the following:   Height as of this encounter: 5' 3 (1.6 m).   Weight as of this encounter: 232 lb (105.2 kg).  Last encounter: 03/22/2024. Last procedure: 04/13/2024.  Reason for encounter:  History of Present Illness   Anita Gibbs is a 54 year old female with cervical spine issues who presents with neck and arm pain related to cervical radiculopathy.  She has a  history of C4-C7 fusion.  She also struggles with posterior headaches related to occipital neuralgia.  She experiences numbness and tingling in both hands, with the left arm being more affected than the right. As a left-handed individual, she has difficulty with grasping and opening objects. Despite undergoing physical therapy since October 2025, which has slightly improved her range of motion, her pain persists.  She has a history of cervical fusion from C4 to C7. Her most recent MRI reportedly showed stenosis at C3-C4 and C4-C5. The pain radiates down her arm, accompanied by muscle spasms in her left arm. She is currently taking methocarbamol , pregabalin , and tramadol  to manage her symptoms.  She describes her pain distribution as 60% on the left side and 40% on the right. Additionally, she reports an increase in the frequency of her headaches-these are present in her occipital region  No use of blood thinners and no history of diabetes.          No results found for: CBDTHCR No results found for: D8THCCBX No results found for: D9THCCBX  ROS  Constitutional: Denies any fever or chills Gastrointestinal: No reported hemesis, hematochezia, vomiting, or acute GI distress Musculoskeletal: Cervicalgia Neurological: Cervical radicular pain, occipital neuralgia  Medication Review  Cyanocobalamin , DULoxetine , EPINEPHrine , Melatonin, Vitamin D -3, albuterol , amLODipine , aspirin, atorvastatin , busPIRone , butalbital -acetaminophen -caffeine , cetirizine , dicyclomine , etanercept, famotidine , fluticasone , hydrOXYzine , hydrochlorothiazide , hydrocortisone , isosorbide  mononitrate, methocarbamol , montelukast , ondansetron , pantoprazole , polyethylene glycol powder, potassium chloride  SA, pregabalin , saccharomyces boulardii, traMADol , traZODone , and triamcinolone  cream  History Review  Allergy : Anita Gibbs is allergic to other, pineapple, pineapple extract, shellfish allergy , strawberry (diagnostic), and  tomato. Drug: Anita Gibbs  reports no history of drug use. Alcohol:  reports current alcohol use of about 2.0 standard drinks of alcohol per week. Tobacco:  reports that she has never smoked. She has never used smokeless tobacco. Social: Anita Gibbs  reports that she has never smoked. She has never used smokeless tobacco. She reports current alcohol use of about 2.0 standard drinks of alcohol per week. She reports that she does not use drugs. 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), Adrenal nodule (06/20/2020), Allergy , ANA positive (02/01/2021), Anxiety and depression (01/29/2021), Arthritis, Back pain with history of spinal surgery (01/29/2021), Bilateral carpal tunnel syndrome (01/29/2021), Bilateral leg edema (01/29/2021), BMI 40.0-44.9, adult (HCC) (04/30/2022), Carpal boss, right, Carpal tunnel syndrome, bilateral, 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) (09/27/2018), Cervical facet hypertrophy (C3-T1) (09/27/2018), Cervical facet joint syndrome (Bilateral) (L>R) (09/27/2018), 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-5, C5-6, and C6-7) (09/27/2018), Cervical myelopathy (HCC) (07/01/2017), Cervicalgia (Primary Area of Pain) (Bilateral) (L>R) (09/27/2018), Chronic gout of foot (Left) (05/31/2018), Chronic low back pain (Tertiary Area of Pain) (Bilateral) (R>L) w/ sciatica (Bilateral) (09/07/2018), Chronic low back pain (Tertiary Area of Pain) (Bilateral) (R>L) w/o sciatica (07/26/2018), Chronic lower extremity pain (Fourth Area of Pain) (Bilateral) (R>L) (09/07/2018), Chronic musculoskeletal pain (10/27/2018), Chronic neck pain (Bilateral) w/ history of cervical spinal surgery (09/27/2018), Chronic neck pain (Primary Area of Pain) (Bilateral) (L>R)  (09/07/2018), Chronic sacroiliac joint dysfunction (Bilateral) (09/27/2018), Chronic sacroiliac joint pain (Right) (09/07/2018), Chronic upper extremity pain (Secondary Area of Pain) (Bilateral) (R>L) (09/27/2018), Disorder of skeletal system (09/07/2018), E. coli UTI (urinary tract infection) (06/20/2020), Elevated sed rate (09/08/2018), Elevated serum creatinine (06/20/2020), Excessive daytime sleepiness (03/06/2020), Female pelvic pain (09/05/2020), GERD (gastroesophageal reflux disease), Headache, History of allergy  to shellfish (09/28/2018), History of fusion of cervical spine (ACDF C4-C7) (09/27/2018), History of illicit drug use (03/07/2020), Hives (09/05/2020), Hives (09/05/2020), Hyperkalemia (05/31/2018), Hyperlipidemia (04/30/2022), Hypertension, Large breasts (05/30/2020), Left hip pain (07/21/2022), Lumbar facet arthropathy (04/20/2019), Lumbar radiculitis (L5 dermatome) (Right) (04/17/2020), Multiple environmental allergies (01/17/2020), Multiple food allergies (03/07/2020), Neurogenic pain (10/27/2018), Numbness and tingling of both feet (05/31/2018), 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), Other intervertebral disc degeneration, 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), Pharmacologic therapy (09/07/2018), Plantar fasciitis, bilateral (03/04/2021), Polyneuropathy (01/29/2021), Prediabetes (10/10/2019), Problems influencing health status (09/07/2018), Rash (08/08/2019), Seasonal allergic rhinitis due to pollen (11/29/2018), Seasonal allergies (03/06/2020), Seasonal asthma (11/29/2018), Sjogren's disease, Sleep apnea, Somatic dysfunction of sacroiliac joint (Bilateral) (09/27/2018), Spondylosis without myelopathy or radiculopathy, cervical region (03/22/2019), Spondylosis without myelopathy or radiculopathy, lumbosacral region (05/19/2019), Spondylosis, cervical, w/ myelopathy (09/27/2018), URI with cough and congestion (06/16/2024), and Vaginal discharge (06/11/2020). Surgical: Anita Gibbs  has  a past surgical history that includes Knee arthroscopy (Right); Anterior cervical decomp/discectomy fusion (N/A, 07/01/2017); Abdominal hysterectomy (10/17/2020); Carpal tunnel release (Bilateral); Cholecystectomy; Tubal ligation (03/10/96); and Cardiac catheterization. Family: family history includes [name] in her mother; Arthritis in her father, mother, and sister; Breast cancer in her maternal aunt and paternal aunt; COPD in her brother and son; Cancer in her brother; Depression in her son; Diabetes in her mother; Early death in her brother; Hearing loss in her father; Hyperlipidemia in her mother; Hypertension in her father, mother, and sister; Kidney disease in her father; Lymphoma in her father; Post-traumatic stress disorder in her brother and son; Sleep apnea in her daughter.  Laboratory Chemistry Profile   Renal Lab Results  Component Value Date  BUN 14 06/16/2024   CREATININE 0.95 06/16/2024   BCR NOT APPLICABLE 02/13/2021   GFR 69.65 06/16/2024   GFRAA 76 09/07/2018   GFRNONAA 56 (L) 05/31/2024    Hepatic Lab Results  Component Value Date   AST 13 06/16/2024   ALT 14 06/16/2024   ALBUMIN 4.3 06/16/2024   ALKPHOS 59 06/16/2024   LIPASE 37.0 06/08/2024    Electrolytes Lab Results  Component Value Date   NA 141 06/16/2024   K 3.9 06/16/2024   CL 105 06/16/2024   CALCIUM  9.2 06/16/2024   MG 2.2 09/07/2018    Bone Lab Results  Component Value Date   VD25OH 29.35 (L) 04/08/2023   25OHVITD1 8.1 (L) 09/07/2018   25OHVITD2 <1.0 09/07/2018   25OHVITD3 8.1 09/07/2018    Inflammation (CRP: Acute Phase) (ESR: Chronic Phase) Lab Results  Component Value Date   CRP <1.0 07/22/2023   ESRSEDRATE 57 (H) 07/22/2023         Note: Above Lab results reviewed.  Recent Imaging Review  US  PELVIC COMPLETE WITH TRANSVAGINAL EXAM: US  Pelvis, Complete Transvaginal and Transabdominal without Doppler  TECHNIQUE: Transabdominal and transvaginal pelvic duplex ultrasound using  B-mode/gray scaled imaging without Doppler spectral analysis and color flow was obtained.  COMPARISON: US  Pelvis Complete with Transvaginal 07/06/2024 and 01/27/2018.  CLINICAL HISTORY: Left adnexal cyst, repeat due to bowel gas.  FINDINGS:  UTERUS: Uterus measures . Uterus demonstrates normal myometrial echotexture.  ENDOMETRIAL STRIPE: Endometrium measures . Endometrial stripe is within normal limits.  RIGHT OVARY: Right ovary not visualized due to bowel gas.  LEFT OVARY: Left ovary measures 3.0 x 1.9 x 2.1 cm. There is a 3.6 cm cyst in the left ovary without internal vascularity. Evaluation is again limited by bowel gas.  FREE FLUID: No free fluid.  IMPRESSION: 1. 3.6 cm left ovarian cyst without internal vascularity. Evaluation is again limited by bowel gas. Follow up ultrasound in 3 to 6 months is recommended.  Electronically signed by: Norman Gatlin MD 08/20/2024 12:48 AM EST RP Workstation: [id number]   CLINICAL DATA:  Chronic neck pain   EXAM: MRI CERVICAL SPINE WITHOUT CONTRAST   TECHNIQUE: Multiplanar, multisequence MR imaging of the cervical spine was performed. No intravenous contrast was administered.   COMPARISON:  CT of the cervical spine dated 12/14/2023   FINDINGS: Alignment: Physiologic.   Vertebrae: Anterior cervical fusion from C4 through C7. no acute fracture is identified.   Cord: Normal signal and morphology.   Posterior Fossa, vertebral arteries, paraspinal tissues: The visualized portions of the skull base and the posterior fossa are normal. No soft tissue abnormality is identified.   Disc levels:   C2-C3: The disk is normal in configuration. No facet arthropathy. No uncovertebral joint disease. No neuroforaminal stenosis. No spinal canal stenosis.   C3-C4: Disc osteophyte complex. No facet arthropathy. Moderate left and mild right uncovertebral joint disease. Moderate left and mild right neuroforaminal stenosis. Mild spinal  canal stenosis.   C4-C5: Disc osteophyte complex with interbody fusion. No facet arthropathy. Moderate left uncovertebral joint disease. Moderate left neuroforaminal stenosis. No spinal canal stenosis.   C5-C6: Disc osteophyte complex with interbody fusion. No facet arthropathy. Mild bilateral uncovertebral joint disease. Mild bilateral neuroforaminal stenosis. No spinal canal stenosis.   C6-C7: Disc osteophyte complex with interbody fusion. No facet arthropathy. No uncovertebral joint disease. No neuroforaminal stenosis. No spinal canal stenosis.   C7-T1: Disc osteophyte complex. No facet arthropathy. No uncovertebral joint disease. No neuroforaminal stenosis. No spinal canal stenosis.   IMPRESSION:  1. Anterior cervical fusion from C4 through C7. 2. Moderate left foraminal stenoses at C3-C4 and C4-C5 secondary to uncovertebral joint disease.     Electronically Signed   By: Clem Savory M.D.   On: 02/11/2024 11:49 Note: Reviewed        Physical Exam  Vitals: BP (!) 135/58   Pulse 64   Temp (!) 97.2 F (36.2 C)   Resp 17   Ht 5' 3 (1.6 m)   Wt 232 lb (105.2 kg)   LMP  (LMP Unknown)   SpO2 100%   BMI 41.10 kg/m  BMI: Estimated body mass index is 41.1 kg/m as calculated from the following:   Height as of this encounter: 5' 3 (1.6 m).   Weight as of this encounter: 232 lb (105.2 kg). Ideal: Ideal body weight: 52.4 kg (115 lb 8.3 oz) Adjusted ideal body weight: 73.5 kg (162 lb 1.8 oz) General appearance: Well nourished, well developed, and well hydrated. In no apparent acute distress Mental status: Alert, oriented x 3 (person, place, & time)       Respiratory: No evidence of acute respiratory distress Eyes: PERLA Cervical Spine Area Exam  Skin & Axial Inspection: Well healed scar from previous spine surgery detected Alignment: Symmetrical Functional ROM: Pain restricted ROM      Stability: No instability detected Muscle Tone/Strength: Functionally intact. No  obvious neuro-muscular anomalies detected. Sensory (Neurological): Neurogenic pain pattern Palpation: No palpable anomalies             Occipital neuralgia Upper Extremity (UE) Exam    Side: Right upper extremity  Side: Left upper extremity  Skin & Extremity Inspection: Skin color, temperature, and hair growth are WNL. No peripheral edema or cyanosis. No masses, redness, swelling, asymmetry, or associated skin lesions. No contractures.  Skin & Extremity Inspection: Skin color, temperature, and hair growth are WNL. No peripheral edema or cyanosis. No masses, redness, swelling, asymmetry, or associated skin lesions. No contractures.  Functional ROM: Pain restricted ROM for shoulder and elbow  Functional ROM: Pain restricted ROM for shoulder and elbow  Muscle Tone/Strength: Functionally intact. No obvious neuro-muscular anomalies detected.  Muscle Tone/Strength: Functionally intact. No obvious neuro-muscular anomalies detected.  Sensory (Neurological): Dermatomal pain pattern          Sensory (Neurological): Dermatomal pain pattern          Palpation: No palpable anomalies              Palpation: No palpable anomalies              Provocative Test(s):  Phalen's test: deferred Tinel's test: deferred Apley's scratch test (touch opposite shoulder):  Action 1 (Across chest): deferred Action 2 (Overhead): deferred Action 3 (LB reach): deferred   Provocative Test(s):  Phalen's test: deferred Tinel's test: deferred Apley's scratch test (touch opposite shoulder):  Action 1 (Across chest): deferred Action 2 (Overhead): deferred Action 3 (LB reach): deferred     Assessment   Diagnosis  1. Cervical radicular pain   2. Bilateral occipital neuralgia   3. Neurogenic pain   4. S/P cervical spinal fusion      Updated Problems: Problem  Neurogenic Pain  S/P Cervical Spinal Fusion    Plan of Care  Problem-specific:  Assessment and Plan    Cervical radiculopathy   Chronic cervical  radiculopathy presents with numbness and tingling in both hands, more pronounced in the left arm, and pain radiating down the arm. She has undergone cervical fusion from C4 to C7,  with a recent MRI indicating stenosis at C3-C4 and C4-C5. Muscle spasms occur in the left arm. Current medications include methocarbamol , pregabalin , and tramadol . A cervical epidural injection with sedation is ordered to reduce nerve inflammation. If this is insufficient, a spinal cord stimulator will be considered. The procedure is scheduled for next week or the week after, depending on availability.  Bilateral occipital neuralgia   Headaches are likely due to occipital neuralgia, possibly related to previous cervical spine fusion, with pain radiating from the neck to the scalp. Occipital nerve injections will be performed during the cervical epidural procedure. Neurogenic pain   Neurogenic pain is associated with cervical radiculopathy and occipital neuralgia. She is currently taking methocarbamol , pregabalin , and tramadol .  History of cervical spinal fusion   She has a history of cervical spinal fusion from C4 to C7, with recent MRI showing stenosis at C3-C4 and C4-C5. Occipital neuralgia may be related to the previous fusion. The spine will be evaluated during the cervical epidural procedure for potential spinal cord stimulator placement.       Anita Gibbs has a current medication list which includes the following long-term medication(s): albuterol , amlodipine , atorvastatin , cetirizine , dicyclomine , duloxetine , enbrel sureclick, famotidine , fluticasone , isosorbide  mononitrate, methocarbamol , montelukast , pantoprazole , potassium chloride  sa, pregabalin , and trazodone .  Pharmacotherapy (Medications Ordered): No orders of the defined types were placed in this encounter.  Orders:  Orders Placed This Encounter  Procedures   Cervical Epidural Injection    Sedation: Patient's choice. Purpose:  Diagnostic/Therapeutic Indication(s): Radiculitis and cervicalgia associater with cervical degenerative disc disease.    Standing Status:   Future    Expiration Date:   12/21/2024    Scheduling Instructions:     Procedure: Cervical Epidural Steroid Injection/Block     Level(s): C7-T1     Laterality: TBD     Timeframe: As soon as schedule allows.    Where will this procedure be performed?:   [facility]             Shonita Rinck   GREATER OCCIPITAL NERVE BLOCK    Standing Status:   Future    Expiration Date:   12/21/2024    Scheduling Instructions:     Procedure: Occipital nerve block     Laterality: Bilateral     Sedation: Iv versed      Timeframe: ASAA    Where will this procedure be performed?:   [facility]    Return in about 6 days (around 09/28/2024) for left c-esi + B/L GONB , in clinic IV Versed  .    Recent Visits Date Type Provider Dept  07/14/24 Office Visit Patel, Seema K, NP [facility]  Showing recent visits within past 90 days and meeting all other requirements Today's Visits Date Type Provider Dept  09/22/24 Office Visit Marcelino Nurse, MD [facility]  Showing today's visits and meeting all other requirements Future Appointments Date Type Provider Dept  10/11/24 Appointment Patel, Seema K, NP [facility]  Showing future appointments within next 90 days and meeting all other requirements  I discussed the assessment and treatment plan with the patient. The patient was provided an opportunity to ask questions and all were answered. The patient agreed with the plan and demonstrated an understanding of the instructions.  Patient advised to call back or seek an in-person evaluation if the symptoms or condition worsens.  I personally spent a total of 30 minutes in the care of the patient today including preparing to see the patient, getting/reviewing  separately obtained history, performing a medically appropriate exam/evaluation,  counseling and educating, placing orders, and documenting clinical information in the EHR.\  Note by: Wallie Sherry, MD (TTS and AI technology used. I apologize for any typographical errors that were not detected and corrected.) Date: 09/22/2024; Time: 12:24 PM

## 2024-09-22 NOTE — Patient Instructions (Signed)
GENERAL RISKS AND COMPLICATIONS  What are the risk, side effects and possible complications? Generally speaking, most procedures are safe.  However, with any procedure there are risks, side effects, and the possibility of complications.  The risks and complications are dependent upon the sites that are lesioned, or the type of nerve block to be performed.  The closer the procedure is to the spine, the more serious the risks are.  Great care is taken when placing the radio frequency needles, block needles or lesioning probes, but sometimes complications can occur. Infection: Any time there is an injection through the skin, there is a risk of infection.  This is why sterile conditions are used for these blocks.  There are four possible types of infection. Localized skin infection. Central Nervous System Infection-This can be in the form of Meningitis, which can be deadly. Epidural Infections-This can be in the form of an epidural abscess, which can cause pressure inside of the spine, causing compression of the spinal cord with subsequent paralysis. This would require an emergency surgery to decompress, and there are no guarantees that the patient would recover from the paralysis. Discitis-This is an infection of the intervertebral discs.  It occurs in about 1% of discography procedures.  It is difficult to treat and it may lead to surgery.        2. Pain: the needles have to go through skin and soft tissues, will cause soreness.       3. Damage to internal structures:  The nerves to be lesioned may be near blood vessels or    other nerves which can be potentially damaged.       4. Bleeding: Bleeding is more common if the patient is taking blood thinners such as  aspirin, Coumadin, Ticiid, Plavix, etc., or if he/she have some genetic predisposition  such as hemophilia. Bleeding into the spinal canal can cause compression of the spinal  cord with subsequent paralysis.  This would require an emergency  surgery to  decompress and there are no guarantees that the patient would recover from the  paralysis.       5. Pneumothorax:  Puncturing of a lung is a possibility, every time a needle is introduced in  the area of the chest or upper back.  Pneumothorax refers to free air around the  collapsed lung(s), inside of the thoracic cavity (chest cavity).  Another two possible  complications related to a similar event would include: Hemothorax and Chylothorax.   These are variations of the Pneumothorax, where instead of air around the collapsed  lung(s), you may have blood or chyle, respectively.       6. Spinal headaches: They may occur with any procedures in the area of the spine.       7. Persistent CSF (Cerebro-Spinal Fluid) leakage: This is a rare problem, but may occur  with prolonged intrathecal or epidural catheters either due to the formation of a fistulous  track or a dural tear.       8. Nerve damage: By working so close to the spinal cord, there is always a possibility of  nerve damage, which could be as serious as a permanent spinal cord injury with  paralysis.       9. Death:  Although rare, severe deadly allergic reactions known as "Anaphylactic  reaction" can occur to any of the medications used.      10. Worsening of the symptoms:  We can always make thing worse.  What are the chances  of something like this happening? Chances of any of this occuring are extremely low.  By statistics, you have more of a chance of getting killed in a motor vehicle accident: while driving to the hospital than any of the above occurring .  Nevertheless, you should be aware that they are possibilities.  In general, it is similar to taking a shower.  Everybody knows that you can slip, hit your head and get killed.  Does that mean that you should not shower again?  Nevertheless always keep in mind that statistics do not mean anything if you happen to be on the wrong side of them.  Even if a procedure has a 1 (one) in a  1,000,000 (million) chance of going wrong, it you happen to be that one..Also, keep in mind that by statistics, you have more of a chance of having something go wrong when taking medications.  Who should not have this procedure? If you are on a blood thinning medication (e.g. Coumadin, Plavix, see list of "Blood Thinners"), or if you have an active infection going on, you should not have the procedure.  If you are taking any blood thinners, please inform your physician.  How should I prepare for this procedure? Do not eat or drink anything at least six hours prior to the procedure. Bring a driver with you .  It cannot be a taxi. Come accompanied by an adult that can drive you back, and that is strong enough to help you if your legs get weak or numb from the local anesthetic. Take all of your medicines the morning of the procedure with just enough water to swallow them. If you have diabetes, make sure that you are scheduled to have your procedure done first thing in the morning, whenever possible. If you have diabetes, take only half of your insulin dose and notify our nurse that you have done so as soon as you arrive at the clinic. If you are diabetic, but only take blood sugar pills (oral hypoglycemic), then do not take them on the morning of your procedure.  You may take them after you have had the procedure. Do not take aspirin or any aspirin-containing medications, at least eleven (11) days prior to the procedure.  They may prolong bleeding. Wear loose fitting clothing that may be easy to take off and that you would not mind if it got stained with Betadine or blood. Do not wear any jewelry or perfume Remove any nail coloring.  It will interfere with some of our monitoring equipment.  NOTE: Remember that this is not meant to be interpreted as a complete list of all possible complications.  Unforeseen problems may occur.  BLOOD THINNERS The following drugs contain aspirin or other products,  which can cause increased bleeding during surgery and should not be taken for 2 weeks prior to and 1 week after surgery.  If you should need take something for relief of minor pain, you may take acetaminophen which is found in Tylenol,m Datril, Anacin-3 and Panadol. It is not blood thinner. The products listed below are.  Do not take any of the products listed below in addition to any listed on your instruction sheet.  A.P.C or A.P.C with Codeine Codeine Phosphate Capsules #3 Ibuprofen Ridaura  ABC compound Congesprin Imuran rimadil  Advil Cope Indocin Robaxisal  Alka-Seltzer Effervescent Pain Reliever and Antacid Coricidin or Coricidin-D  Indomethacin Rufen  Alka-Seltzer plus Cold Medicine Cosprin Ketoprofen S-A-C Tablets  Anacin Analgesic Tablets or Capsules Coumadin  Korlgesic Salflex  Anacin Extra Strength Analgesic tablets or capsules CP-2 Tablets Lanoril Salicylate  Anaprox Cuprimine Capsules Levenox Salocol  Anexsia-D Dalteparin Magan Salsalate  Anodynos Darvon compound Magnesium Salicylate Sine-off  Ansaid Dasin Capsules Magsal Sodium Salicylate  Anturane Depen Capsules Marnal Soma  APF Arthritis pain formula Dewitt's Pills Measurin Stanback  Argesic Dia-Gesic Meclofenamic Sulfinpyrazone  Arthritis Bayer Timed Release Aspirin Diclofenac Meclomen Sulindac  Arthritis pain formula Anacin Dicumarol Medipren Supac  Analgesic (Safety coated) Arthralgen Diffunasal Mefanamic Suprofen  Arthritis Strength Bufferin Dihydrocodeine Mepro Compound Suprol  Arthropan liquid Dopirydamole Methcarbomol with Aspirin Synalgos  ASA tablets/Enseals Disalcid Micrainin Tagament  Ascriptin Doan's Midol Talwin  Ascriptin A/D Dolene Mobidin Tanderil  Ascriptin Extra Strength Dolobid Moblgesic Ticlid  Ascriptin with Codeine Doloprin or Doloprin with Codeine Momentum Tolectin  Asperbuf Duoprin Mono-gesic Trendar  Aspergum Duradyne Motrin or Motrin IB Triminicin  Aspirin plain, buffered or enteric coated  Durasal Myochrisine Trigesic  Aspirin Suppositories Easprin Nalfon Trillsate  Aspirin with Codeine Ecotrin Regular or Extra Strength Naprosyn Uracel  Atromid-S Efficin Naproxen Ursinus  Auranofin Capsules Elmiron Neocylate Vanquish  Axotal Emagrin Norgesic Verin  Azathioprine Empirin or Empirin with Codeine Normiflo Vitamin E  Azolid Emprazil Nuprin Voltaren  Bayer Aspirin plain, buffered or children's or timed BC Tablets or powders Encaprin Orgaran Warfarin Sodium  Buff-a-Comp Enoxaparin Orudis Zorpin  Buff-a-Comp with Codeine Equegesic Os-Cal-Gesic   Buffaprin Excedrin plain, buffered or Extra Strength Oxalid   Bufferin Arthritis Strength Feldene Oxphenbutazone   Bufferin plain or Extra Strength Feldene Capsules Oxycodone with Aspirin   Bufferin with Codeine Fenoprofen Fenoprofen Pabalate or Pabalate-SF   Buffets II Flogesic Panagesic   Buffinol plain or Extra Strength Florinal or Florinal with Codeine Panwarfarin   Buf-Tabs Flurbiprofen Penicillamine   Butalbital Compound Four-way cold tablets Penicillin   Butazolidin Fragmin Pepto-Bismol   Carbenicillin Geminisyn Percodan   Carna Arthritis Reliever Geopen Persantine   Carprofen Gold's salt Persistin   Chloramphenicol Goody's Phenylbutazone   Chloromycetin Haltrain Piroxlcam   Clmetidine heparin Plaquenil   Cllnoril Hyco-pap Ponstel   Clofibrate Hydroxy chloroquine Propoxyphen         Before stopping any of these medications, be sure to consult the physician who ordered them.  Some, such as Coumadin (Warfarin) are ordered to prevent or treat serious conditions such as "deep thrombosis", "pumonary embolisms", and other heart problems.  The amount of time that you may need off of the medication may also vary with the medication and the reason for which you were taking it.  If you are taking any of these medications, please make sure you notify your pain physician before you undergo any procedures.         Moderate Conscious  Sedation, Adult Sedation is the use of medicines to help you relax and not feel pain. Moderate conscious sedation is a type of sedation that makes you less alert than normal. You are still able to respond to instructions, touch, or both. This type of sedation is used during short medical and dental procedures. It is milder than deep sedation, which is a type of sedation you cannot be easily woken up from. It is also milder than general anesthesia, which is the use of medicines to make you fall asleep. Moderate conscious sedation lets you return to your normal activities sooner. Tell a health care provider about: Any allergies you have. All medicines you are taking, including vitamins, herbs, steroids, eye drops, creams, and over-the-counter medicines. Any problems you or family members have had with anesthesia.  Any bleeding problems you have. Any surgeries you have had. Any medical conditions you have. Whether you are pregnant or may be pregnant. Any recent alcohol, tobacco, or drug use. What are the risks? Your health care provider will talk with you about risks. These may include: Oversedation. This is when you get too much medicine. Nausea or vomiting. Allergic reaction to medicines. Trouble breathing. If this happens, a breathing tube may be used. It will be removed when you can breathe better on your own. Heart trouble. Lung trouble. Emergence delirium. This is when you feel confused while the sedation wears off. This gets better with time. What happens before the procedure? When to stop eating and drinking Follow instructions from your health care provider about what you may eat and drink. These may include: 8 hours before your procedure Stop eating most foods. Do not eat meat, fried foods, or fatty foods. Eat only light foods, such as toast or crackers. All liquids are okay except energy drinks and alcohol. 6 hours before your procedure Stop eating. Drink only clear liquids, such  as water, clear fruit juice, black coffee, plain tea, and sports drinks. Do not drink energy drinks or alcohol. 2 hours before your procedure Stop drinking all liquids. You may be allowed to take medicines with small sips of water. If you do not follow your health care provider's instructions, your procedure may be delayed or canceled. Medicines Ask your health care provider about: Changing or stopping your regular medicines. These include any diabetes medicines or blood thinners you take. Taking medicines such as aspirin and ibuprofen. These medicines can thin your blood. Do not take them unless your health care provider tells you to. Taking over-the-counter medicines, vitamins, herbs, and supplements. Tests and exams You may have an exam or testing. You may have a blood or urine sample taken. General instructions Do not use any products that contain nicotine or tobacco for at least 4 weeks before the procedure. These products include cigarettes, chewing tobacco, and vaping devices, such as e-cigarettes. If you need help quitting, ask your health care provider. If you will be going home right after the procedure, plan to have a responsible adult: Take you home from the hospital or clinic. You will not be allowed to drive. Care for you for the time you are told. What happens during the procedure?  You will be given the sedative. It may be given: As a pill you can take by mouth. It can also be put into the rectum. As a spray through the nose. As an injection into muscle. As an injection into a vein through an IV. You may be given oxygen as needed. Your blood pressure, heart rate, breathing rate, and blood oxygen level will be monitored during the procedure. The medical or dental procedure will be done. The procedure may vary among health care providers and hospitals. What happens after the procedure? Your blood pressure, heart rate, breathing rate, and blood oxygen level will be  monitored until you leave the hospital or clinic. You will get fluids through an IV as needed. Do not drive or operate machinery until your health care provider says that it is safe. This information is not intended to replace advice given to you by your health care provider. Make sure you discuss any questions you have with your health care provider. Document Revised: 03/03/2022 Document Reviewed: 03/03/2022 [employer] Patient Education  2024 [employer] Occipital Nerve Block Patient Information  Description: The occipital nerves originate in the cervical (  neck) spinal cord and travel upward through muscle and tissue to supply sensation to the back of the head and top of the scalp.  In addition, the nerves control some of the muscles of the scalp.  Occipital neuralgia is an irritation of these nerves which can cause headaches, numbness of the scalp, and neck discomfort.     The occipital nerve block will interrupt nerve transmission through these nerves and can relieve pain and spasm.  The block consists of insertion of a small needle under the skin in the back of the head to deposit local anesthetic (numbing medicine) and/or steroids around the nerve.  The entire block usually lasts less than 5 minutes.  Conditions which may be treated by occipital blocks:  Muscular pain and spasm of the scalp Nerve irritation, back of the head Headaches Upper neck pain  Preparation for the injection:  Do not eat any solid food or dairy products within 8 hours of your appointment. You may drink clear liquids up to 3 hours before appointment.  Clear liquids include water, black coffee, juice or soda.  No milk or cream please. You may take your regular medication, including pain medications, with a sip of water before you appointment.  Diabetics should hold regular insulin (if taken separately) and take 1/2 normal NPH dose the morning of the procedure.  Carry some sugar containing items with you to your  appointment. A driver must accompany you and be prepared to drive you home after your procedure. Bring all your current medications with you. An IV may be inserted and sedation may be given at the discretion of the physician. A blood pressure cuff, EKG, and other monitors will often be applied during the procedure.  Some patients may need to have extra oxygen administered for a short period. You will be asked to provide medical information, including your allergies and medications, prior to the procedure.  We must know immediately if you are taking blood thinners (like Coumadin/Warfarin) or if you are allergic to IV iodine contrast (dye).  We must know if you could possible be pregnant.  Do not wear a high collared shirt or turtleneck.  Tie long hair up in the back if possible.  Possible side-effects:  Bleeding from needle site Infection (rare, may require surgery) Nerve injury (rare) Hair on back of neck can be tinged with iodine scrub (this will wash out) Light-headedness (temporary) Pain at injection site (several days) Decreased blood pressure (rare, temporary) Seizure (very rare)  Call if you experience:  Hives or difficulty breathing ( go to the emergency room) Inflammation or drainage at the injection site(s)  Please note:  Although the local anesthetic injected can often make your painful muscles or headache feel good for several hours after the injection, the pain may return.  It takes 3-7 days for steroids to work.  You may not notice any pain relief for at least one week.  If effective, we will often do a series of injections spaced 3-6 weeks apart to maximally decrease your pain.  If you have any questions, please call [phone] [facility] Epidural Steroid Injection Patient Information  Description: The epidural space surrounds the nerves as they exit the spinal cord.  In some patients, the nerves can be compressed and inflamed by  a bulging disc or a tight spinal canal (spinal stenosis).  By injecting steroids into the epidural space, we can bring irritated nerves into direct contact with a potentially helpful medication.  These steroids act directly on the irritated nerves and can reduce swelling and inflammation which often leads to decreased pain.  Epidural steroids may be injected anywhere along the spine and from the neck to the low back depending upon the location of your pain.   After numbing the skin with local anesthetic (like Novocaine), a small needle is passed into the epidural space slowly.  You may experience a sensation of pressure while this is being done.  The entire block usually last less than 10 minutes.  Conditions which may be treated by epidural steroids:  Low back and leg pain Neck and arm pain Spinal stenosis Post-laminectomy syndrome Herpes zoster (shingles) pain Pain from compression fractures  Preparation for the injection:  Do not eat any solid food or dairy products within 8 hours of your appointment.  You may drink clear liquids up to 3 hours before appointment.  Clear liquids include water, black coffee, juice or soda.  No milk or cream please. You may take your regular medication, including pain medications, with a sip of water before your appointment  Diabetics should hold regular insulin (if taken separately) and take 1/2 normal NPH dos the morning of the procedure.  Carry some sugar containing items with you to your appointment. A driver must accompany you and be prepared to drive you home after your procedure.  Bring all your current medications with your. An IV may be inserted and sedation may be given at the discretion of the physician.   A blood pressure cuff, EKG and other monitors will often be applied during the procedure.  Some patients may need to have extra oxygen administered for a short period. You will be asked to provide medical information, including your allergies, prior  to the procedure.  We must know immediately if you are taking blood thinners (like Coumadin/Warfarin)  Or if you are allergic to IV iodine contrast (dye). We must know if you could possible be pregnant.  Possible side-effects: Bleeding from needle site Infection (rare, may require surgery) Nerve injury (rare) Numbness & tingling (temporary) Difficulty urinating (rare, temporary) Spinal headache ( a headache worse with upright posture) Light -headedness (temporary) Pain at injection site (several days) Decreased blood pressure (temporary) Weakness in arm/leg (temporary) Pressure sensation in back/neck (temporary)  Call if you experience: Fever/chills associated with headache or increased back/neck pain. Headache worsened by an upright position. New onset weakness or numbness of an extremity below the injection site Hives or difficulty breathing (go to the emergency room) Inflammation or drainage at the infection site Severe back/neck pain Any new symptoms which are concerning to you  Please note:  Although the local anesthetic injected can often make your back or neck feel good for several hours after the injection, the pain will likely return.  It takes 3-7 days for steroids to work in the epidural space.  You may not notice any pain relief for at least that one week.  If effective, we will often do a series of three injections spaced 3-6 weeks apart to maximally decrease your pain.  After the initial series, we generally will wait several months before considering a repeat injection of the same type.  If you have any questions, please call [phone] [facility]

## 2024-09-22 NOTE — Progress Notes (Signed)
 Safety precautions to be maintained throughout the outpatient stay will include: orient to surroundings, keep bed in low position, maintain call bell within reach at all times, provide assistance with transfer out of bed and ambulation.

## 2024-09-26 ENCOUNTER — Ambulatory Visit: Payer: MEDICAID | Admitting: Clinical

## 2024-09-26 DIAGNOSIS — F410 Panic disorder [episodic paroxysmal anxiety] without agoraphobia: Secondary | ICD-10-CM

## 2024-09-26 DIAGNOSIS — F411 Generalized anxiety disorder: Secondary | ICD-10-CM

## 2024-09-26 DIAGNOSIS — F32A Depression, unspecified: Secondary | ICD-10-CM

## 2024-09-26 NOTE — Progress Notes (Signed)
 "  [facility] Counselor/Therapist Progress Note  Patient ID: Anita Gibbs, MRN: [id number],    Date: 09/26/2024  Time Spent: 12:34pm - 1:16pm : 42 minutes   Treatment Type: Individual Therapy  Reported Symptoms: pain  Mental Status Exam: Appearance:  Casual     Behavior: Patient participated in session laying down due to pain  Motor: Patient reported difficulty moving due to pain  Speech/Language:  Clear and Coherent and Normal Rate  Affect: Appropriate  Mood: normal  Thought process: normal  Thought content:   WNL  Sensory/Perceptual disturbances:   Patient reported experiencing pain  Orientation: oriented to person, place, time/date, and situation  Attention: Good  Concentration: Good  Memory: WNL  Fund of knowledge:  Good  Insight:   Good  Judgment:  Good  Impulse Control: Good   Risk Assessment: Danger to Self:  No Patient denied current suicidal ideation  Self-injurious Behavior: No Danger to Others: No Patient denied current homicidal ideation Duty to Warn:no Physical Aggression / Violence:No  Access to Firearms a concern: No  Gang Involvement:No   Subjective: Patient stated, I'm fairing, I'm in pain. Patient reported one of those days where I can't move hardly in reference to pain. Patient stated, my mood's been fine. Patient stated, I did finally get some assistance through social service to help with my gas bill and patient reported patient's gas has been reconnected. Patient reported no changes since last session. Patient stated, everything is aching from head to toein reference to pain. 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 Patient reported patient wants to resume traveling. Patient reported pain is a barrier to travel. Patient reported patient previously enjoyed walking around downtown and enjoyed going into the shops downtown. Patient  stated, anything to distract me from what Im going through. Patient stated, I'll try anything and see if it helps in response to grounding exercises.   Interventions: Cognitive Behavioral Therapy. Clinician conducted session via caregility video from clinician's home office. Patient provided verbal consent to proceed with telehealth session and is aware of limitations of telephone or video visits. Patient participated in session from patient's home. Reviewed events since last session and assessed for changes. Discussed current experience related to pain and the impact on patient's activities. Reviewed patient's participation in positive activities since last session. Explored patient's areas of interest to identify additional positive activities. Discussed barriers to travel. Provided psycho education related to grounding exercises and reviewed multiple grounding exercises.    Collaboration of Care: not required at this time   Diagnosis:  Panic disorder   Generalized anxiety disorder   Depression, unspecified depression type     Plan: Patient is to utilize [employer] [facility], thought re-framing, behavioral activation, relaxation techniques, mindfulness and coping strategies to decrease symptoms associated with their diagnosis. Frequency: bi-weekly  Modality: individual      Long-term goal:   Reduce overall level, frequency, and intensity of the feelings of depression, anxiety and panic as evidenced by decreased panic attacks,fear of future panic attacks, lack of  concentration, difficulty falling asleep and staying asleep, muscle tension, feeling on edge, restlessness, and irritability from 7 days/week to 1 to 3 days/week per patient report for at least 3 consecutive months. Target Date: 12/03/24  Progress: progressing    Short-term goal:  Identify and access local resources to increase patient's financial stability  Target Date: 12/03/24  Progress: progressing    Decrease  feelings of panic/anxiety from 4 to 5 days per week to 1 to 2 days per week by implementing healthy coping strategies, such as, relaxation techniques and mindfulness exercises Target Date: 12/03/24  Progress: progressing    Identify, challenge, and replace negative thought patterns and negative self talk that contribute to feelings of depression and anxiety with positive thoughts, beliefs, and positive self talk per patient's report Target Date: 12/03/24  Progress: progressing    Increase patient's participation in activities patient enjoys from 1 time per week to 3 times per week  Target Date: 12/03/24  Progress: progressing     Darice Seats, LCSW    "

## 2024-09-26 NOTE — Progress Notes (Signed)
   Anita Seats, LCSW

## 2024-09-27 ENCOUNTER — Ambulatory Visit: Payer: MEDICAID

## 2024-09-27 DIAGNOSIS — M6281 Muscle weakness (generalized): Secondary | ICD-10-CM | POA: Diagnosis not present

## 2024-09-27 DIAGNOSIS — M542 Cervicalgia: Secondary | ICD-10-CM

## 2024-09-27 DIAGNOSIS — M5412 Radiculopathy, cervical region: Secondary | ICD-10-CM

## 2024-09-27 NOTE — Progress Notes (Unsigned)
 PROVIDER NOTE: Interpretation of information contained herein should be left to medically-trained personnel. Specific patient instructions are provided elsewhere under Patient Instructions section of medical record. This document was created in part using AI and STT-dictation technology, any transcriptional errors that may result from this process are unintentional.  Patient: Anita Gibbs  Service: E/M   PCP: Gretel App, NP  DOB: August 06, 1971  DOS: 09/28/2024  Provider: Emmy MARLA Blanch, NP  MRN: [id number]  Delivery: Face-to-face  Specialty: Interventional Pain Management  Type: Established Patient  Setting: Ambulatory outpatient facility  Specialty designation: 09  Referring Prov.: Gretel App, NP  Location: Outpatient office facility       History of present illness (HPI) Anita Gibbs, a 54 y.o. year old female, is here today because of her No primary diagnosis found.. Anita Gibbs primary complain today is No chief complaint on file.  Pertinent problems: Anita Gibbs has Essential hypertension; Chronic pain syndrome; S/P cervical spinal fusion; DDD (degenerative disc disease), cervical; Cervical spondylosis; DDD (degenerative disc disease), lumbar; Vitamin D  deficiency; Neurogenic pain; Gastroesophageal reflux disease; Lumbosacral radiculopathy at L5; Neuropathy; Morbid obesity with BMI of 40.0-44.9, adult (HCC); Seronegative spondyloarthropathy; MDD (major depressive disorder), recurrent episode, severe (HCC); Cervical radicular pain; and Bilateral occipital neuralgia on their pertinent problem list.  Pain Assessment: Severity of   is reported as a  /10. Location:    / . Onset:  . Quality:  . Timing:  . Modifying factor(s):  SABRA Vitals:  vitals were not taken for this visit.  BMI: Estimated body mass index is 41.1 kg/m as calculated from the following:   Height as of 09/22/24: 5' 3 (1.6 m).   Weight as of 09/22/24: 232 lb (105.2 kg).  Last encounter: 07/14/2024. Last procedure: Visit  date not found.  Reason for encounter: {Blank single:19197::medication management,post-procedure evaluation and assessment,both, medication management and post-procedure evaluation and assessment,evaluation of worsening, or previously known (established) problem,patient-requested evaluation,follow-up evaluation,evaluation for possible interventional PM therapy/treatment,evaluation of new problem, *** }.   Discussed the use of AI scribe software for clinical note transcription with the patient, who gave verbal consent to proceed.  History of Present Illness           Pharmacotherapy Assessment   Tramadol  (Ultram -ER) 100 mg 24-hour tablet daily for pain. MME=20 Pregabalin  (Lyrica ) 100 mg capsule 3 times daily for neuropathic pain Monitoring: [place] PMP: PDMP reviewed during this encounter.       Pharmacotherapy: No side-effects or adverse reactions reported. Compliance: No problems identified. Effectiveness: Clinically acceptable.  [name], [place]  07/14/2024  8:13 AM  Sign when Signing Visit Nursing Pain Medication Assessment:  Safety precautions to be maintained throughout the outpatient stay will include: orient to surroundings, keep bed in low position, maintain call bell within reach at all times, provide assistance with transfer out of bed and ambulation.  Medication Inspection Compliance: Pill count conducted under aseptic conditions, in front of the patient. Neither the pills nor the bottle was removed from the patient's sight at any time. Once count was completed pills were immediately returned to the patient in their original bottle.  Medication: Tramadol  (Ultram ) Pill/Patch Count: 4 of 30 pills/patches remain Pill/Patch Appearance: Markings consistent with prescribed medication Bottle Appearance: Standard pharmacy container. Clearly labeled. Filled Date: 63 / 13 / 2025 Last Medication intake:  Yesterday    UDS:  Summary  Date Value Ref Range Status   02/01/2020 Note  Final    Comment:    ==================================================================== [employer] Select 13 (  MW) ==================================================================== Test                             Result       Flag       Units Drug Present not Declared for Prescription Verification   Carboxy-THC                    20           UNEXPECTED ng/mg creat    Carboxy-THC is a metabolite of tetrahydrocannabinol (THC). Source of    THC is most commonly herbal marijuana or marijuana-based products,    but THC is also present in a scheduled prescription medication.    Trace amounts of THC can be present in hemp and cannabidiol (CBD)    products. This test is not intended to distinguish between delta-9-    tetrahydrocannabinol, the predominant form of THC in most herbal or    marijuana-based products, and delta-8-tetrahydrocannabinol. Drug Absent but Declared for Prescription Verification   Oxycodone                       Not Detected UNEXPECTED ng/mg creat ==================================================================== Test                      Result    Flag   Units      Ref Range   Creatinine              230              mg/dL      >=79 ==================================================================== Declared Medications:  The flagging and interpretation on this report are based on the  following declared medications.  Unexpected results may arise from  inaccuracies in the declared medications.  **Note: The testing scope of this panel includes these medications:  Oxycodone  (Roxicodone )  **Note: The testing scope of this panel does not include the  following reported medications:  Albuterol  (Ventolin  HFA)  Amlodipine  (Norvasc )  Atorvastatin  (Lipitor)  Cetirizine   Diphenhydramine (Benadryl)  Epinephrine  (EpiPen )  Fluticasone  (Flonase )  Gabapentin  (Neurontin )  Hydrochlorothiazide   Losartan  (Cozaar )  Meloxicam  (Mobic )  Methocarbamol   (Robaxin )  Montelukast  (Singulair )  Norethindrone (Aygestin)  Pantoprazole  (Protonix )  Vitamin D3 ==================================================================== For clinical consultation, please call [phone]. ====================================================================     No results found for: CBDTHCR No results found for: D8THCCBX No results found for: D9THCCBX  ROS  Constitutional: Denies any fever or chills Gastrointestinal: No reported hemesis, hematochezia, vomiting, or acute GI distress Musculoskeletal: Low back pain, neck pain radiated to bilateral arms Neurological: No reported episodes of acute onset apraxia, aphasia, dysarthria, agnosia, amnesia, paralysis, loss of coordination, or loss of consciousness  Medication Review  Cyanocobalamin , DULoxetine , EPINEPHrine , Melatonin, Vitamin D -3, albuterol , amLODipine , atorvastatin , busPIRone , butalbital -acetaminophen -caffeine , cetirizine , dicyclomine , etanercept, famotidine , fluconazole , fluticasone , hydrOXYzine , hydrochlorothiazide , hydrocortisone , losartan , methocarbamol , montelukast , ondansetron , pantoprazole , polyethylene glycol powder, potassium chloride  SA, predniSONE , pregabalin , saccharomyces boulardii, tirzepatide , traMADol , traZODone , and triamcinolone  cream  History Review  Allergy : Anita Gibbs is allergic to other, pineapple, pineapple extract, shellfish allergy , strawberry (diagnostic), and tomato. Drug: Anita Gibbs  reports no history of drug use. Alcohol:  reports current alcohol use of about 2.0 standard drinks of alcohol per week. Tobacco:  reports that she has never smoked. She has never used smokeless tobacco. Social: Ms. Grondin  reports that she has never smoked. She has never used smokeless tobacco. She reports current alcohol use of about 2.0 standard  drinks of alcohol per week. She reports that she does not use drugs. 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), Adrenal nodule (06/20/2020), Allergy , ANA positive (02/01/2021), Anxiety and depression (01/29/2021), Arthritis, Back pain with history of spinal surgery (01/29/2021), Bilateral carpal tunnel syndrome (01/29/2021), Bilateral leg edema (01/29/2021), BMI 40.0-44.9, adult (HCC) (04/30/2022), Carpal boss, right, Carpal tunnel syndrome, bilateral, 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) (09/27/2018), Cervical facet hypertrophy (C3-T1) (09/27/2018), Cervical facet joint syndrome (Bilateral) (L>R) (09/27/2018), 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-5, C5-6, and C6-7) (09/27/2018), Cervical myelopathy (HCC) (07/01/2017), Cervicalgia (Primary Area of Pain) (Bilateral) (L>R) (09/27/2018), Chronic gout of foot (Left) (05/31/2018), Chronic low back pain (Tertiary Area of Pain) (Bilateral) (R>L) w/ sciatica (Bilateral) (09/07/2018), Chronic low back pain (Tertiary Area of Pain) (Bilateral) (R>L) w/o sciatica (07/26/2018), Chronic lower extremity pain (Fourth Area of Pain) (Bilateral) (R>L) (09/07/2018), Chronic musculoskeletal pain (10/27/2018), Chronic neck pain (Bilateral) w/ history of cervical spinal surgery (09/27/2018), Chronic neck pain (Primary Area of Pain) (Bilateral) (L>R) (09/07/2018), Chronic sacroiliac joint dysfunction (Bilateral) (09/27/2018), Chronic sacroiliac joint pain (Right) (09/07/2018), Chronic upper extremity pain (Secondary Area of Pain) (Bilateral) (R>L) (09/27/2018), Disorder of skeletal system (09/07/2018), E. coli UTI (urinary tract infection) (06/20/2020), Elevated sed rate (09/08/2018), Elevated serum creatinine (06/20/2020), Excessive daytime sleepiness (03/06/2020), Female pelvic pain (09/05/2020), GERD (gastroesophageal reflux disease), Headache, History  of allergy  to shellfish (09/28/2018), History of fusion of cervical spine (ACDF C4-C7) (09/27/2018), History of illicit drug use (03/07/2020), Hives (09/05/2020), Hives (09/05/2020), Hyperkalemia (05/31/2018), Hyperlipidemia (04/30/2022), Hypertension, Large breasts (05/30/2020), Left hip pain (07/21/2022), Lumbar facet arthropathy (04/20/2019), Lumbar radiculitis (L5 dermatome) (Right) (04/17/2020), Multiple environmental allergies (01/17/2020), Multiple food allergies (03/07/2020), Neurogenic pain (10/27/2018), Numbness and tingling of both feet (05/31/2018), 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), Other intervertebral disc degeneration, 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), Pharmacologic therapy (09/07/2018), Plantar fasciitis, bilateral (03/04/2021), Polyneuropathy (01/29/2021), Prediabetes (10/10/2019), Problems influencing health status (09/07/2018), Rash (08/08/2019), Seasonal allergic rhinitis due to pollen (11/29/2018), Seasonal allergies (03/06/2020), Seasonal asthma (11/29/2018), Sjogren's disease, Sleep apnea, Somatic dysfunction of sacroiliac joint (Bilateral) (09/27/2018), Spondylosis without myelopathy or radiculopathy, cervical region (03/22/2019), Spondylosis without myelopathy or radiculopathy, lumbosacral region (05/19/2019), Spondylosis, cervical, w/ myelopathy (09/27/2018), URI with cough and congestion (06/16/2024), and Vaginal discharge (06/11/2020). Surgical: Anita Gibbs  has a past surgical history that includes Knee arthroscopy (Right); Anterior cervical decomp/discectomy fusion (N/A, 07/01/2017); Abdominal hysterectomy (10/17/2020); Carpal tunnel release (Bilateral); and Cholecystectomy. Family: family history includes [name] in her mother; Arthritis in her father, mother, and sister; Breast cancer in her maternal aunt and paternal aunt; COPD in her brother and son; Cancer in her brother; Depression in her son; Diabetes in her mother; Early death in her brother; Hearing loss  in her father; Hyperlipidemia in her mother; Hypertension in her father, mother, and sister; Kidney disease in her father; Lymphoma in her father; Post-traumatic stress disorder in her brother and son; Sleep apnea in her daughter.  Laboratory Chemistry Profile   Renal Lab Results  Component Value Date   BUN 14 06/16/2024   CREATININE 0.95 06/16/2024   BCR NOT APPLICABLE 02/13/2021   GFR 69.65 06/16/2024   GFRAA 76 09/07/2018   GFRNONAA 56 (L) 05/31/2024    Hepatic Lab Results  Component Value Date   AST 13 06/16/2024   ALT 14 06/16/2024   ALBUMIN 4.3 06/16/2024   ALKPHOS 59 06/16/2024  LIPASE 37.0 06/08/2024    Electrolytes Lab Results  Component Value Date   NA 141 06/16/2024   K 3.9 06/16/2024   CL 105 06/16/2024   CALCIUM  9.2 06/16/2024   MG 2.2 09/07/2018    Bone Lab Results  Component Value Date   VD25OH 29.35 (L) 04/08/2023   25OHVITD1 8.1 (L) 09/07/2018   25OHVITD2 <1.0 09/07/2018   25OHVITD3 8.1 09/07/2018    Inflammation (CRP: Acute Phase) (ESR: Chronic Phase) Lab Results  Component Value Date   CRP <1.0 07/22/2023   ESRSEDRATE 57 (H) 07/22/2023         Note: Above Lab results reviewed.  Recent Imaging Review  US  PELVIC COMPLETE WITH TRANSVAGINAL EXAM: US  Pelvis, Complete Transvaginal and Transabdominal without Doppler  TECHNIQUE: Transabdominal and transvaginal pelvic duplex ultrasound using B-mode/gray scaled imaging without Doppler spectral analysis and color flow was obtained.  COMPARISON: US  Pelvis 01/27/2018  CLINICAL HISTORY: Left adnexal cyst, hysterectomy.  FINDINGS:  UTERUS: Hysterectomy.  RIGHT OVARY: Neither the right ovary nor the left ovary were well visualized due to overlying bowel gas.  LEFT OVARY: Neither the right ovary nor the left ovary were well visualized due to overlying bowel gas.  FREE FLUID: No free fluid.  IMPRESSION: 1. Hysterectomy. 2. The ovaries not well visualized due to overlying bowel  gas.  Electronically signed by: Rogelia Myers MD 07/12/2024 04:03 PM EST RP Workstation: [id number] Note: Reviewed        Physical Exam  Vitals: BP (!) 141/81 (BP Location: Left Arm, Patient Position: Sitting)   Pulse 69   Temp (!) 97.1 F (36.2 C) (Temporal)   Resp 18   Ht 5' 3 (1.6 m)   Wt 238 lb (108 kg)   LMP  (LMP Unknown)   SpO2 100%   BMI 42.16 kg/m  BMI: Estimated body mass index is 42.16 kg/m as calculated from the following:   Height as of this encounter: 5' 3 (1.6 m).   Weight as of this encounter: 238 lb (108 kg). Ideal: Ideal body weight: 52.4 kg (115 lb 8.3 oz) Adjusted ideal body weight: 74.6 kg (164 lb 8.2 oz) General appearance: Well nourished, well developed, and well hydrated. In no apparent acute distress Mental status: Alert, oriented x 3 (person, place, & time)       Respiratory: No evidence of acute respiratory distress Eyes: PERLA  Musculoskeletal: +LBP Neck pain radiated to bilateral arms Assessment   Diagnosis Status  1. Cervical radicular pain   2. Neurogenic pain   3. Chronic pain syndrome   4. Chronic musculoskeletal pain   5. Lumbar facet joint syndrome   6. Bilateral occipital neuralgia   7. Medication management    Controlled Controlled Controlled   Updated Problems: No problems updated.  Plan of Care  Problem-specific:  Assessment and Plan    Monitoring: [place] PMP: PDMP not reviewed this encounter. {Blank single:19197::Unable to conduct review of the controlled substance reporting system due to technological failure.,     } Pharmacotherapy: {Blank single:19197::Opioid-induced constipation (OIC)(K59.03, T40.2X5A),No side-effects or adverse reactions reported.} Compliance: {Blank single:19197::Medication agreement violation - unsanctioned acquisition/use of additional opioid-containing medication,No problems identified.} Effectiveness: {Blank single:19197::Clinically acceptable.}  No notes on file  UDS:   Summary  Date Value Ref Range Status  07/14/2024 FINAL  Final    Comment:    ==================================================================== [employer] Select 13 (MW) ==================================================================== Test  Result       Flag       Units  Drug Present and Declared for Prescription Verification   Tramadol                        >3289        EXPECTED   ng/mg creat   O-Desmethyltramadol            2668         EXPECTED   ng/mg creat   N-Desmethyltramadol            368          EXPECTED   ng/mg creat    Source of tramadol  is a prescription medication. O-desmethyltramadol    and N-desmethyltramadol are expected metabolites of tramadol .  Drug Absent but Declared for Prescription Verification   Butalbital                      Not Detected UNEXPECTED ==================================================================== Test                      Result    Flag   Units      Ref Range   Creatinine              152              mg/dL      >=79 ==================================================================== Declared Medications:  The flagging and interpretation on this report are based on the  following declared medications.  Unexpected results may arise from  inaccuracies in the declared medications.   **Note: The testing scope of this panel includes these medications:   Butalbital  (Fioricet)  Tramadol  (Ultram )   **Note: The testing scope of this panel does not include the  following reported medications:   Acetaminophen  (Fioricet)  Albuterol  (Ventolin  HFA)  Amlodipine  (Norvasc )  Atorvastatin  (Lipitor)  Buspirone  (Buspar )  Caffeine  (Fioricet)  Cetirizine  (Zyrtec )  Cyanocobalamin   Dicyclomine  (Bentyl )  Duloxetine  (Cymbalta )  Epinephrine  (EpiPen )  Etanercept (Enbrel)  Famotidine  (Pepcid )  Fluconazole  (Diflucan )  Fluticasone  (Flonase )  Hydrochlorothiazide  (Hydrodiuril )  Hydroxyzine  (Vistaril )  Losartan   (Cozaar )  Melatonin  Methocarbamol  (Robaxin )  Montelukast  (Singulair )  Ondansetron  (Zofran )  Pantoprazole  (Protonix )  Polyethylene Glycol (MiraLAX )  Potassium (Klor-Con )  Prednisone  (Deltasone )  Pregabalin  (Lyrica )  Probiotic (Florastor)  Tirzepatide  (Zepbound )  Topical  Trazodone  (Desyrel )  Vitamin D2 ==================================================================== For clinical consultation, please call [phone]. ====================================================================     No results found for: CBDTHCR No results found for: D8THCCBX No results found for: D9THCCBX  ROS  Constitutional: {Blank single:19197::Denies any fever or chills} Gastrointestinal: {Blank single:19197::No reported hemesis, hematochezia, vomiting, or acute GI distress} Musculoskeletal: {Blank single:19197::Denies any acute onset joint swelling, redness, loss of ROM, or weakness} Neurological: {Blank single:19197::No reported episodes of acute onset apraxia, aphasia, dysarthria, agnosia, amnesia, paralysis, loss of coordination, or loss of consciousness}  Medication Review  Cyanocobalamin , DULoxetine , EPINEPHrine , Melatonin, Vitamin D -3, albuterol , amLODipine , aspirin, atorvastatin , busPIRone , butalbital -acetaminophen -caffeine , cetirizine , dicyclomine , etanercept, famotidine , fluticasone , hydrOXYzine , hydrochlorothiazide , hydrocortisone , isosorbide  mononitrate, methocarbamol , montelukast , ondansetron , pantoprazole , polyethylene glycol powder, potassium chloride  SA, pregabalin , saccharomyces boulardii, traMADol , traZODone , and triamcinolone  cream  History Review  Allergy : Ms. Doane is allergic to other, pineapple, pineapple extract, shellfish allergy , strawberry (diagnostic), and tomato. Drug: Anita Gibbs  reports no history of drug use. Alcohol:  reports current alcohol use of about 2.0 standard drinks of alcohol per week. Tobacco:  reports that she has never smoked. She  has never used smokeless tobacco.  Social: Anita Gibbs  reports that she has never smoked. She has never used smokeless tobacco. She reports current alcohol use of about 2.0 standard drinks of alcohol per week. She reports that she does not use drugs. 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), Adrenal nodule (06/20/2020), Allergy , ANA positive (02/01/2021), Anxiety and depression (01/29/2021), Arthritis, Back pain with history of spinal surgery (01/29/2021), Bilateral carpal tunnel syndrome (01/29/2021), Bilateral leg edema (01/29/2021), BMI 40.0-44.9, adult (HCC) (04/30/2022), Carpal boss, right, Carpal tunnel syndrome, bilateral, 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) (09/27/2018), Cervical facet hypertrophy (C3-T1) (09/27/2018), Cervical facet joint syndrome (Bilateral) (L>R) (09/27/2018), 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-5, C5-6, and C6-7) (09/27/2018), Cervical myelopathy (HCC) (07/01/2017), Cervicalgia (Primary Area of Pain) (Bilateral) (L>R) (09/27/2018), Chronic gout of foot (Left) (05/31/2018), Chronic low back pain (Tertiary Area of Pain) (Bilateral) (R>L) w/ sciatica (Bilateral) (09/07/2018), Chronic low back pain (Tertiary Area of Pain) (Bilateral) (R>L) w/o sciatica (07/26/2018), Chronic lower extremity pain (Fourth Area of Pain) (Bilateral) (R>L) (09/07/2018), Chronic musculoskeletal pain (10/27/2018), Chronic neck pain (Bilateral) w/ history of cervical spinal surgery (09/27/2018), Chronic neck pain (Primary Area of Pain) (Bilateral) (L>R) (09/07/2018), Chronic sacroiliac joint dysfunction (Bilateral) (09/27/2018), Chronic sacroiliac joint pain (Right) (09/07/2018), Chronic upper extremity pain (Secondary Area of Pain) (Bilateral) (R>L)  (09/27/2018), Disorder of skeletal system (09/07/2018), E. coli UTI (urinary tract infection) (06/20/2020), Elevated sed rate (09/08/2018), Elevated serum creatinine (06/20/2020), Excessive daytime sleepiness (03/06/2020), Female pelvic pain (09/05/2020), GERD (gastroesophageal reflux disease), Headache, History of allergy  to shellfish (09/28/2018), History of fusion of cervical spine (ACDF C4-C7) (09/27/2018), History of illicit drug use (03/07/2020), Hives (09/05/2020), Hives (09/05/2020), Hyperkalemia (05/31/2018), Hyperlipidemia (04/30/2022), Hypertension, Large breasts (05/30/2020), Left hip pain (07/21/2022), Lumbar facet arthropathy (04/20/2019), Lumbar radiculitis (L5 dermatome) (Right) (04/17/2020), Multiple environmental allergies (01/17/2020), Multiple food allergies (03/07/2020), Neurogenic pain (10/27/2018), Numbness and tingling of both feet (05/31/2018), 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), Other intervertebral disc degeneration, 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), Pharmacologic therapy (09/07/2018), Plantar fasciitis, bilateral (03/04/2021), Polyneuropathy (01/29/2021), Prediabetes (10/10/2019), Problems influencing health status (09/07/2018), Rash (08/08/2019), Seasonal allergic rhinitis due to pollen (11/29/2018), Seasonal allergies (03/06/2020), Seasonal asthma (11/29/2018), Sjogren's disease, Sleep apnea, Somatic dysfunction of sacroiliac joint (Bilateral) (09/27/2018), Spondylosis without myelopathy or radiculopathy, cervical region (03/22/2019), Spondylosis without myelopathy or radiculopathy, lumbosacral region (05/19/2019), Spondylosis, cervical, w/ myelopathy (09/27/2018), URI with cough and congestion (06/16/2024), and Vaginal discharge (06/11/2020). Surgical: Anita Gibbs  has a past surgical history that includes Knee arthroscopy (Right); Anterior cervical decomp/discectomy fusion (N/A, 07/01/2017); Abdominal hysterectomy (10/17/2020); Carpal tunnel release (Bilateral);  Cholecystectomy; Tubal ligation (03/10/96); and Cardiac catheterization. Family: family history includes [name] in her mother; Arthritis in her father, mother, and sister; Breast cancer in her maternal aunt and paternal aunt; COPD in her brother and son; Cancer in her brother; Depression in her son; Diabetes in her mother; Early death in her brother; Hearing loss in her father; Hyperlipidemia in her mother; Hypertension in her father, mother, and sister; Kidney disease in her father; Lymphoma in her father; Post-traumatic stress disorder in her brother and son; Sleep apnea in her daughter.  Laboratory Chemistry Profile   Renal Lab Results  Component Value Date   BUN 14 06/16/2024   CREATININE 0.95 06/16/2024   BCR NOT APPLICABLE 02/13/2021   GFR 69.65 06/16/2024   GFRAA 76 09/07/2018   GFRNONAA 56 (L) 05/31/2024  Hepatic Lab Results  Component Value Date   AST 13 06/16/2024   ALT 14 06/16/2024   ALBUMIN 4.3 06/16/2024   ALKPHOS 59 06/16/2024   LIPASE 37.0 06/08/2024    Electrolytes Lab Results  Component Value Date   NA 141 06/16/2024   K 3.9 06/16/2024   CL 105 06/16/2024   CALCIUM  9.2 06/16/2024   MG 2.2 09/07/2018    Bone Lab Results  Component Value Date   VD25OH 29.35 (L) 04/08/2023   25OHVITD1 8.1 (L) 09/07/2018   25OHVITD2 <1.0 09/07/2018   25OHVITD3 8.1 09/07/2018    Inflammation (CRP: Acute Phase) (ESR: Chronic Phase) Lab Results  Component Value Date   CRP <1.0 07/22/2023   ESRSEDRATE 57 (H) 07/22/2023         Note: {Blank single:19197::Anita Gibbs indicates labs done and monitored by primary care practitioner using a non-[facility] EMR system,No results found under the [facility] electronic medical record,Patient noncompliant with Lab Work orders.,Results made available to patient.,Lab results reviewed and made available to patient.,Lab results reviewed and explained to patient in Layman's terms.,Above Lab results reviewed.}  Recent Imaging  Review  US  PELVIC COMPLETE WITH TRANSVAGINAL EXAM: US  Pelvis, Complete Transvaginal and Transabdominal without Doppler  TECHNIQUE: Transabdominal and transvaginal pelvic duplex ultrasound using B-mode/gray scaled imaging without Doppler spectral analysis and color flow was obtained.  COMPARISON: US  Pelvis Complete with Transvaginal 07/06/2024 and 01/27/2018.  CLINICAL HISTORY: Left adnexal cyst, repeat due to bowel gas.  FINDINGS:  UTERUS: Uterus measures . Uterus demonstrates normal myometrial echotexture.  ENDOMETRIAL STRIPE: Endometrium measures . Endometrial stripe is within normal limits.  RIGHT OVARY: Right ovary not visualized due to bowel gas.  LEFT OVARY: Left ovary measures 3.0 x 1.9 x 2.1 cm. There is a 3.6 cm cyst in the left ovary without internal vascularity. Evaluation is again limited by bowel gas.  FREE FLUID: No free fluid.  IMPRESSION: 1. 3.6 cm left ovarian cyst without internal vascularity. Evaluation is again limited by bowel gas. Follow up ultrasound in 3 to 6 months is recommended.  Electronically signed by: Norman Gatlin MD 08/20/2024 12:48 AM EST RP Workstation: [id number] Note: {Blank single:19197::No new results found.,No results found under the [facility] electronic medical record.,Imaging results reviewed and explained to patient in Layman's terms.,Results of ordered imaging test(s) reviewed and explained to patient in Layman's terms.,Imaging results reviewed.,Reviewed} {Blank single:19197::Results visible under [facility].,Results visible under [facility]Results visible under [facility]Results visible under [facility].,Results visible under Care Everywhere.,Results made available to patient.,Copy of results provided to patient.,Patient noncompliant with diagnostic imaging orders.,     }  Physical Exam  Vitals: LMP  (LMP Unknown)  BMI: Estimated body mass index is 41.1 kg/m as calculated from the following:    Height as of 09/22/24: 5' 3 (1.6 m).   Weight as of 09/22/24: 232 lb (105.2 kg). Ideal: Ideal body weight: 52.4 kg (115 lb 8.3 oz) Adjusted ideal body weight: 73.5 kg (162 lb 1.8 oz) General appearance: {general exam:210120802::Well nourished, well developed, and well hydrated. In no apparent acute distress} Mental status: {Blank single:19197::Alert and oriented x 3. Exaggerated physical and/or psychosocial pain behavior perceived.,Alert, oriented x 3 (person, place, & time)} {Blank single:19197::Anita Gibbs's speech pattern and demeanor seems to suggest oversedation,     } Respiratory: {Blank single:19197::Oxygen-dependent COPD,No evidence of acute respiratory distress} Eyes: {Blank single:19197::Miotic (pupilary constriction) due to opiate use,Midriatic,Anisocoric,Evidence of ptosis,Pin-point pupils,PERLA}   Assessment   Diagnosis Status  No diagnosis found. {Blank single:19197::Absent,Deteriorating,Flared-up,Improved,Improving,Intermittent,New,Persistent,Present,Recurrent,Resolved,Responsive,Stable,Unchanged,Unimproved,Unresponsive,Unstable,Worsened,Worsening,Controlled} {Blank single:19197::Absent,Deteriorating,Flared-up,Improved,Improving,Intermittent,New,Persistent,Present,Recurrent,Resolved,Responsive,Stable,Unchanged,Unimproved,Unresponsive,Unstable,Worsened,Worsening,Controlled} {Blank single:19197::Absent,Deteriorating,Flared-up,Improved,Improving,Intermittent,New,Persistent,Present,Recurrent,Resolved,Responsive,Stable,Unchanged,Unimproved,Unresponsive,Unstable,Worsened,Worsening,Controlled}  Updated Problems: Problem  Symptomatic Cholelithiasis  Radial Styloid Tenosynovitis of Left Hand    Plan of Care  Problem-specific:  Assessment and Plan            Anita Gibbs has a current medication list which includes the following  long-term medication(s): albuterol , amlodipine , atorvastatin , cetirizine , dicyclomine , duloxetine , enbrel sureclick, famotidine , fluticasone , isosorbide  mononitrate, methocarbamol , montelukast , pantoprazole , potassium chloride  sa, pregabalin , and trazodone .  Pharmacotherapy (Medications Ordered): No orders of the defined types were placed in this encounter.  Orders:  No orders of the defined types were placed in this encounter.    {There is no content from the last Plan section.}   No follow-ups on file.    Recent Visits Date Type Provider Dept  09/22/24 Office Visit Marcelino Nurse, MD [facility]  07/14/24 Office Visit Trevyon Swor K, NP [facility]  Showing recent visits within past 90 days and meeting all other requirements Future Appointments Date Type Provider Dept  09/28/24 Appointment Jermesha Sottile K, NP [facility]  10/11/24 Appointment Fatih Stalvey K, NP [facility]  Showing future appointments within next 90 days and meeting all other requirements  I discussed the assessment and treatment plan with the patient. The patient was provided an opportunity to ask questions and all were answered. The patient agreed with the plan and demonstrated an understanding of the instructions.  Patient advised to call back or seek an in-person evaluation if the symptoms or condition worsens.  Duration of encounter: *** minutes.  Total time on encounter, as per AMA guidelines included both the face-to-face and non-face-to-face time personally spent by the physician and/or other qualified health care professional(s) on the day of the encounter (includes time in activities that require the physician or other qualified health care professional and does not include time in activities normally performed by clinical staff). Physician's time may include the following activities when performed: Preparing to see the patient (e.g., pre-charting review of records,  searching for previously ordered imaging, lab work, and nerve conduction tests) Review of prior analgesic pharmacotherapies. Reviewing PMP Interpreting ordered tests (e.g., lab work, imaging, nerve conduction tests) Performing post-procedure evaluations, including interpretation of diagnostic procedures Obtaining and/or reviewing separately obtained history Performing a medically appropriate examination and/or evaluation Counseling and educating the patient/family/caregiver Ordering medications, tests, or procedures Referring and communicating with other health care professionals (when not separately reported) Documenting clinical information in the electronic or other health record Independently interpreting results (not separately reported) and communicating results to the patient/ family/caregiver Care coordination (not separately reported)  Note by: Bunny Kleist K Shady Padron, NP (TTS and AI technology used. I apologize for any typographical errors that were not detected and corrected.) Date: 09/28/2024; Time: 1:13 PM

## 2024-09-27 NOTE — Therapy (Signed)
 " OUTPATIENT PHYSICAL THERAPY CERVICAL TREATMENT/DISCHARGE SUMMARY    Patient Name: Anita Gibbs MRN: [id number] DOB:[birth year], 54 y.o., female Today's Date: 09/27/2024  END OF SESSION:  PT End of Session - 09/27/24 1348     Visit Number 11    Number of Visits 17    Date for Recertification  10/12/24    Authorization Type [employer] [id number] for 10 PT vsts from 12/4-01/31/25    Authorization Time Period 12/4-01/31/25    Authorization - Visit Number 7    Authorization - Number of Visits 10    PT Start Time 1348    PT Stop Time 1430    PT Time Calculation (min) 42 min    Activity Tolerance Patient tolerated treatment well    Behavior During Therapy [facility] for tasks assessed/performed          Past Medical History:  Diagnosis Date   Abdominal pain 06/11/2020   Abnormal drug screen 03/06/2020   Abnormal MRI, cervical spine (06/10/2018) 09/27/2018   FINDINGS:  Vertebrae: fusion hardware at C4, C5, C6, and C7.  Posterior Fossa, vertebral arteries, paraspinal tissues: A relatively empty sella present.     Disc levels:  C3-4: Negative. A mild broad-based disc osteophyte complex present. Uncovertebral spurring contributes to mild foraminal narrowing bilaterally.  C4-5: A leftward disc osteophyte complex is present. Uncovertebral and facet disease   Acute cystitis without hematuria 06/18/2020   Acute vaginitis 06/11/2020   Adenoma of left adrenal gland 08/01/2020   Adrenal nodule 06/20/2020   Allergy     ANA positive 02/01/2021   Anxiety and depression 01/29/2021   Arthritis    Back pain with history of spinal surgery 01/29/2021   Bilateral carpal tunnel syndrome 01/29/2021   Bilateral leg edema 01/29/2021   BMI 40.0-44.9, adult (HCC) 04/30/2022   Carpal boss, right    Carpal tunnel syndrome, bilateral    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) 09/27/2018   Levels:  C4-5: There is partial effacement of ventral CSF.     IMPRESSION:  Mild residual central canal narrowing at C4-5  s/p ACDF.   Cervical facet hypertrophy (C3-T1) 09/27/2018   Levels:  C3-4: Uncovertebral spurring  C4-5: Uncovertebral and facet disease  C5-6: Residual uncovertebral disease.  C6-7: Residual uncovertebral disease.  C7-T1: Minimal left-sided uncovertebral spurring   Cervical facet joint syndrome (Bilateral) (L>R) 09/27/2018   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-5, C5-6, and C6-7) 09/27/2018   Levels:  C3-4: Mild foraminal narrowing bilaterally.  C4-5: Mild left foraminal narrowing.  C5-6: Mild left foraminal narrowing is due to residual uncovertebral disease.  C6-7: Mild left foraminal narrowing is due to residual uncovertebral disease.   Cervical myelopathy (HCC) 07/01/2017   Cervicalgia (Primary Area of Pain) (Bilateral) (L>R) 09/27/2018   Chronic gout of foot (Left) 05/31/2018   Chronic low back pain [facility] Area of Pain) (Bilateral) (R>L) w/ sciatica (Bilateral) 09/07/2018   Chronic low back pain [facility] Area of Pain) (Bilateral) (R>L) w/o sciatica 07/26/2018   Chronic lower extremity pain (Fourth Area of Pain) (Bilateral) (R>L) 09/07/2018   Chronic musculoskeletal pain 10/27/2018   Chronic neck pain (Bilateral) w/ history of cervical spinal surgery 09/27/2018   Chronic neck pain (Primary Area of Pain) (Bilateral) (L>R) 09/07/2018   Chronic sacroiliac joint dysfunction (Bilateral) 09/27/2018   Chronic sacroiliac joint pain (Right) 09/07/2018   Chronic upper extremity pain (Secondary Area of Pain) (Bilateral) (R>L) 09/27/2018   Disorder of skeletal system 09/07/2018   E. coli UTI (urinary tract infection) 06/20/2020   Elevated  sed rate 09/08/2018   Elevated serum creatinine 06/20/2020   Excessive daytime sleepiness 03/06/2020   Female pelvic pain 09/05/2020   GERD (gastroesophageal reflux disease)    Headache    History of allergy  to shellfish 09/28/2018   History of fusion of cervical spine (ACDF C4-C7) 09/27/2018   History of illicit drug use 03/07/2020    Hives 09/05/2020   Hives 09/05/2020   Hyperkalemia 05/31/2018   Mild   Hyperlipidemia 04/30/2022   Hypertension    Large breasts 05/30/2020   Left hip pain 07/21/2022   Lumbar facet arthropathy 04/20/2019   Lumbar radiculitis (L5 dermatome) (Right) 04/17/2020   Multiple environmental allergies 01/17/2020   Multiple food allergies 03/07/2020   Neurogenic pain 10/27/2018   Numbness and tingling of both feet 05/31/2018   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   Other intervertebral disc degeneration, lumbar region 11/15/2019   Pharmacologic therapy 09/07/2018   Plantar fasciitis, bilateral 03/04/2021   Polyneuropathy 01/29/2021   Prediabetes 10/10/2019   Problems influencing health status 09/07/2018   Rash 08/08/2019   Seasonal allergic rhinitis due to pollen 11/29/2018   Seasonal allergies 03/06/2020   Seasonal asthma 11/29/2018   Sjogren's disease    Sleep apnea    Somatic dysfunction of sacroiliac joint (Bilateral) 09/27/2018   Spondylosis without myelopathy or radiculopathy, cervical region 03/22/2019   Spondylosis without myelopathy or radiculopathy, lumbosacral region 05/19/2019   Spondylosis, cervical, w/ myelopathy 09/27/2018   URI with cough and congestion 06/16/2024   Vaginal discharge 06/11/2020   Past Surgical History:  Procedure Laterality Date   ABDOMINAL HYSTERECTOMY  10/17/2020   [facility]; she has both ovaries   ANTERIOR CERVICAL DECOMP/DISCECTOMY FUSION N/A 07/01/2017   Procedure: ANTERIOR CERVICAL DECOMPRESSION/DISCECTOMY FUSION 3 LEVELS;  Surgeon: Clois Fret, MD;  Location: [facility] ORS;  Service: Neurosurgery;  Laterality: N/A;   CARDIAC CATHETERIZATION     CARPAL TUNNEL RELEASE Bilateral    CHOLECYSTECTOMY     KNEE ARTHROSCOPY Right    TUBAL LIGATION  03/10/96   Patient Active Problem List   Diagnosis Date Noted   Acute otalgia, bilateral 09/08/2024   Serous otitis media 08/22/2024   Symptomatic cholelithiasis 06/28/2024   URI  with cough and congestion 06/16/2024   Hypokalemia 06/09/2024   Nausea 06/08/2024   Abdominal bloating 05/05/2024   Cervical radicular pain 03/22/2024   Bilateral occipital neuralgia 03/22/2024   Sjogren syndrome with inflammatory arthritis 03/21/2024   MDD (major depressive disorder), recurrent episode, severe (HCC) 03/16/2024   Insomnia 02/25/2024   MDD (major depressive disorder), recurrent episode, moderate (HCC) 01/18/2024   OSA (obstructive sleep apnea) 01/18/2024   Seronegative spondyloarthropathy 01/05/2024   Episodic tension-type headache, not intractable 01/05/2024   Palpitations 11/24/2023   Wasp sting 11/24/2023   AKI (acute kidney injury) 11/02/2023   Other chest pain 10/29/2023   GAD (generalized anxiety disorder) 09/30/2023   Lower abdominal pain 07/22/2023   Abnormal glucose 04/24/2023   Mood disorder 04/24/2023   Annual physical exam 04/24/2023   Anemia 04/24/2023   Radial styloid tenosynovitis of left hand 02/17/2023   Chronic frontal sinusitis 10/08/2022   Abdominal pain, left lower quadrant 06/30/2022   Morbid obesity with BMI of 40.0-44.9, adult (HCC) 04/30/2022   Allergic rhinitis 03/04/2021   Bilateral leg edema 01/29/2021   Lumbosacral radiculopathy at L5 01/29/2021   Neuropathy 01/29/2021   Vaginal discharge 06/11/2020   Bacterial vaginosis 06/11/2020   Pain medication agreement broken 03/07/2020   Prediabetes 10/10/2019   HLD (hyperlipidemia) 10/10/2019   Gastroesophageal reflux  disease 09/06/2019   Fibroid uterus 09/06/2019   Seasonal asthma 11/29/2018   Neurogenic pain 10/27/2018   S/P cervical spinal fusion 09/27/2018   DDD (degenerative disc disease), cervical 09/27/2018   Cervical spondylosis 09/27/2018   DDD (degenerative disc disease), lumbar 09/27/2018   Vitamin D  deficiency 09/27/2018   Chronic pain syndrome 09/07/2018   Essential hypertension 05/31/2018    PCP: Gretel App, NP  REFERRING PROVIDER: Hilma Hastings, PA-C  REFERRING  DIAG:  Z98.1 (ICD-10-CM) - S/P cervical spinal fusion M47.812 (ICD-10-CM) - Cervical spondylosis M54.12 (ICD-10-CM) - Cervical radiculopathy R20.0,R20.2 (ICD-10-CM) - Numbness and tingling in left hand  THERAPY DIAG:  Muscle weakness (generalized)  Cervicalgia  Radiculopathy, cervical region  Rationale for Evaluation and Treatment: Rehabilitation  ONSET DATE: Chronic   SUBJECTIVE:                                                                                                                                                                                                         SUBJECTIVE STATEMENT: Patient reports she is having more pain recently and getting migraines. She is having trouble with dropping items in her hands. No issues with grip but maintaining the grip is difficult. The pain is more on the L than R. Patient reports if she turns her head too many times, she may get lightheaded.    Hand dominance: Left  PERTINENT HISTORY:  She is s/p C4-C7 ACDF on 07/01/17 by Dr. Clois. She continues with constant neck pain radiating down left arm to the hand causing numbness and tingling. She has intermittent pain in right harm. She has weakness in both hands.  PMH of HTN, asthma, GERD, chronic pain syndrome, hyperlipidemia, obesity.  PAIN:  Are you having pain? Yes: NPRS scale: 7/10; worst 10/10 Pain location: neck Pain description: aching, dull, throbbing Aggravating factors: housework, any activity  Relieving factors: heat, pain medication  PRECAUTIONS: None  RED FLAGS: Cervical red flags: Diplopia Yes:       WEIGHT BEARING RESTRICTIONS: No  FALLS:  Has patient fallen in last 6 months? No  LIVING ENVIRONMENT: Lives with: lives alone Lives in: House/apartment Has following equipment at home: Single point cane  OCCUPATION: not working, trying to get disability   PLOF: Independent - occasional assistance with ADLs when in severe pain  PATIENT GOALS: to see if  I can get some type of relief    OBJECTIVE:  Note: Objective measures were completed at Evaluation unless otherwise noted.  DIAGNOSTIC FINDINGS:  N/A   PATIENT SURVEYS:  NDI:  NECK DISABILITY INDEX  Date: 06/22/2024  Score  Pain intensity 3 = The pain is fairly severe at the moment  2. Personal care (washing, dressing, etc.) 3 =  I need some help but can manage most of my personal care  3. Lifting 4 =  I can only lift very light weights  4. Reading 3 = I can't read as much as I want because of moderate pain in my neck  5. Headaches 4 = I have severe headaches, which come frequently   6. Concentration 2 = I have a fair degree of difficulty in concentrating when I want to  7. Work 4 = I can hardly do any work at all  8. Driving 2 =  I can drive my car as long as I want with moderate pain in my neck  9. Sleeping 5 =  My sleep is completely disturbed (5-7 hrs sleepless)   10. Recreation 3 = I am able to engage in a few of my usual recreation activities because of pain in   my neck  Total 33/50 = 66%   Minimum Detectable Change (90% confidence): 5 points or 10% points  COGNITION: Overall cognitive status: Within functional limits for tasks assessed  SENSATION: Intermittent numbness/tingling in B hands (L>R)  POSTURE: rounded shoulders and forward head  PALPATION: Tightness noted to B UT, cervical paraspinals, suboccipitals  Tenderness noted but patient reports this is likely due to autoimmune disorder.   CERVICAL ROM:   Active ROM A/PROM (deg) eval  Flexion 25  Extension 28  Right lateral flexion 25  Left lateral flexion 15  Right rotation 10  Left rotation 20   (Blank rows = not tested)  UPPER EXTREMITY MMT:  MMT Right eval Left eval  Shoulder flexion 4 4  Shoulder extension    Shoulder abduction 4 4  Shoulder adduction    Shoulder extension    Shoulder internal rotation    Shoulder external rotation    Elbow flexion 4 4  Elbow extension 4 4  Wrist flexion     Wrist extension    Wrist ulnar deviation    Wrist radial deviation    Wrist pronation    Wrist supination     (Blank rows = not tested)  UPPER EXTREMITY ROM:  AROM WFL   CERVICAL SPECIAL TESTS:  Distraction test: Positive - decrease in L UE numbness/tingling    TREATMENT DATE: 09/27/24                                                                                                                               Subjective:  Patient reports 8/10 pain in the upper neck extending in both arms. Patient with SPC due to unsteadiness. Still agreeable to discharge from PT due to minimal progress. She will f/u with pain physician (Dr. Elston) regarding epidural for the neck.  No further questions or concerns.   Therapeutic Exercise:  Shoulder Row   1 x 10 - 15#  1 x 10 - 25#  Lat Pull Down  2 x 10 - 25#    Seated Scaption against DB   3 x 10 - 5#   Doorway Pec Stretch   30s/bout x 3 in order to improve m  uscle tension/tissue extensibility  Time spent on reviewing HEP and updated exercises.   Manual Therapy (15 min billed):   Moderate to Light STM applied to right and left cervical paraspinals, levator scapulae, upper trapezius musculature to decrease muscle tension. Myofascial release, trigger point and contract and relax techniques utilized. Pt endorsed improvements in pain following intervention.   Suboccipital Release   30s/bout x 3 in order to improve muscle tension/tissue extensibility  Manual Upper Trapezius Stretch - Supine PT facilitated Scapular Depression  R/L: 30s/bout x 3 in order to improve muscle tension/tissue extensibility    PATIENT EDUCATION:  Education details: Exercise Technique  Person educated: Patient Education method: Explanation, Demonstration, and Handouts Education comprehension: verbalized understanding and returned demonstration  HOME EXERCISE PROGRAM: Access Code: [email or web address] URL: [email or web address] Date:  08/10/2024 Prepared by: Lonni Pall  Exercises - Seated Upper Trapezius Stretch  - 2-3 x daily - 5-7 x weekly - 3-5 reps - 30-60 seconds hold - Seated Levator Scapulae Stretch  - 2-3 x daily - 5-7 x weekly - 3-5 reps - 30-60 second hold - Seated Shoulder Rolls  - 2-3 x daily - 5-7 x weekly - 1 sets - 10 reps - Supine Suboccipital Release with Tennis Balls  - 2-3 x daily - 5-7 x weekly - as needed --> 1-2 minutes  hold - Standing Shoulder Row with Anchored Resistance  - 1 x daily - 3-4 x weekly - 2-3 sets - 10-12 reps - Seated Scapular Retraction  - 1 x daily - 3-4 x weekly - 2-3 sets - 10-12 reps - Shoulder External Rotation and Scapular Retraction with Resistance  - 1 x daily - 3-4 x weekly - 2-3 sets - 10-12 reps - Prone Shoulder Horizontal Abduction with External Rotation  - 1 x daily - 3-4 x weekly - 2-3 sets - 10-12 reps - First Rib Mobilization with Strap  - 1 x daily - 7 x weekly - 2-3 sets - 10 reps - 3s hold  Access Code: [email or web address] URL: [email or web address] Date: 07/12/2024 Prepared by: Lonni Destinie Thornsberry  Exercises - Seated Upper Trapezius Stretch  - 2-3 x daily - 5-7 x weekly - 3-5 reps - 30-60 seconds hold - Seated Levator Scapulae Stretch  - 2-3 x daily - 5-7 x weekly - 3-5 reps - 30-60 second hold - Seated Shoulder Rolls  - 2-3 x daily - 5-7 x weekly - 1 sets - 10 reps - Supine Suboccipital Release with Tennis Balls  - 2-3 x daily - 5-7 x weekly - as needed --> 1-2 minutes  hold - Standing Shoulder Row with Anchored Resistance  - 1 x daily - 3-4 x weekly - 2-3 sets - 10-12 reps - Seated Scapular Retraction  - 1 x daily - 3-4 x weekly - 2-3 sets - 10-12 reps - Shoulder External Rotation and Scapular Retraction with Resistance  - 1 x daily - 3-4 x weekly - 2-3 sets - 10-12 reps   ASSESSMENT:  CLINICAL IMPRESSION: Ms Brynlei Klausner is a83 y.o. female seen for cervicalgia and radiculopathy. Pt reached end of POC and is agreeable to discharge to home with HEP.  Patient has partially met one of her goals and hasn't made any improvement in pain or migraine frequency. She  has demonstrated significant improvements with cervical AROM especially cervical rotation but pain is still present ( L > R). Per self report on NDI; patient with moderate to severe disability secondary to neck pain. The patient has achieved independence with daily activities and is able to perform exercises with proper technique. At this time patient routed to referring provider for additional evaluation and interventions to treat current pain. No question at end of session. Follow-up with their primary care provider is recommended if any issues arise.   OBJECTIVE IMPAIRMENTS: decreased balance, decreased endurance, decreased mobility, difficulty walking, decreased ROM, decreased strength, hypomobility, impaired flexibility, impaired sensation, postural dysfunction, and pain.   ACTIVITY LIMITATIONS: carrying, lifting, squatting, stairs, transfers, bathing, and dressing  PARTICIPATION LIMITATIONS: cleaning, laundry, and community activity  PERSONAL FACTORS: Age, Behavior pattern, Past/current experiences, and Time since onset of injury/illness/exacerbation are also affecting patient's functional outcome.   REHAB POTENTIAL: Good  CLINICAL DECISION MAKING: Stable/uncomplicated  EVALUATION COMPLEXITY: Moderate   GOALS: Goals reviewed with patient? Yes  SHORT TERM GOALS: Target date: 07/20/2024  Patient will be independent in HEP to improve strength/mobility for better functional independence with ADLs. Baseline: 06/22/24: HEP initiated; 09/07/2023: 50% adherence     Goal status: Progressing   LONG TERM GOALS: Target date: 10/12/2024  Patient will reduce Neck Disability Index score to <10% to demonstrate minimal disability with ADL's including improved sleeping tolerance, sitting tolerance, etc for better mobility at home and work. Baseline: 06/22/24: 33/50 = 66%; 09/07/2023: 25 /  50 = 50.0 %; 09/27/2024: 36 / 50 = 72.0 % Goal status: Goal not met   2.  Patient will report a worst pain of 3/10 on NRPS in neck to improve tolerance with ADLs and reduced symptoms with activities.  Baseline: 06/22/24: 10/10 worst; 09/06/2024:8/10 NPS; 09/27/2024: 10+/10 NPS  Goal status: Goal Not Met    3.  Patient will improve B UE strength by 1/3 MMT grade to demonstrate improvement in tolerance to activity and strength to perform ADLs independently.  Baseline: 06/22/24: see above; 09/07/2023: MMT Right eval Left eval R/L 09/06/2024  Shoulder flexion 4 4 4+/4+  Shoulder extension     Shoulder abduction 4 4 4+/4+  Shoulder adduction     Shoulder extension     Shoulder internal rotation     Shoulder external rotation     Elbow flexion 4 4 5/5  Elbow extension 4 4 5/5   Goal status: Partially Met  4.  Patient will improve cervical ROM in all directions by 10 degrees to improve ability to look in all directions while driving safely.  Baseline: 06/22/24: see above; 09/06/2024: Active ROM A/PROM (deg) eval AROM (deg) AROM (deg)  Flexion 25 25   Extension 28 26   Right lateral flexion 25 25 20   Left lateral flexion 15 20 20*  Right rotation 10 60 50  Left rotation 20 50 50*     Goal status: Goal Met  5.  Patient will report decrease in frequency of migraines to < 2 per week to demonstrate improvement in muscular tightness in cervical region and ability tolerate daily activities with reduction in pain.  Baseline: 06/22/24: daily; 08/04/24: 2-3x a week; 09/06/2024: 2-3x a week;09/27/2024: 3x/week  Goal status: Goal Not Met    PLAN:  PT FREQUENCY: 1-2x/week  PT DURATION: 8 weeks  PLANNED INTERVENTIONS: Discharge  PLAN FOR NEXT SESSION: Discharge  Lonni Pall PT, DPT Physical Therapist- [facility] 09/27/2024, 2:34 PM   "

## 2024-09-28 ENCOUNTER — Encounter: Payer: Self-pay | Admitting: Nurse Practitioner

## 2024-09-28 ENCOUNTER — Other Ambulatory Visit: Payer: Self-pay | Admitting: *Deleted

## 2024-09-28 ENCOUNTER — Ambulatory Visit: Payer: MEDICAID | Attending: Nurse Practitioner | Admitting: Nurse Practitioner

## 2024-09-28 VITALS — BP 142/65 | HR 59 | Temp 97.0°F | Resp 18 | Ht 63.0 in | Wt 232.0 lb

## 2024-09-28 DIAGNOSIS — M7918 Myalgia, other site: Secondary | ICD-10-CM | POA: Diagnosis present

## 2024-09-28 DIAGNOSIS — Z79899 Other long term (current) drug therapy: Secondary | ICD-10-CM | POA: Diagnosis present

## 2024-09-28 DIAGNOSIS — M47816 Spondylosis without myelopathy or radiculopathy, lumbar region: Secondary | ICD-10-CM | POA: Insufficient documentation

## 2024-09-28 DIAGNOSIS — G8929 Other chronic pain: Secondary | ICD-10-CM

## 2024-09-28 DIAGNOSIS — M5412 Radiculopathy, cervical region: Secondary | ICD-10-CM | POA: Insufficient documentation

## 2024-09-28 DIAGNOSIS — G894 Chronic pain syndrome: Secondary | ICD-10-CM

## 2024-09-28 DIAGNOSIS — Z981 Arthrodesis status: Secondary | ICD-10-CM | POA: Diagnosis present

## 2024-09-28 DIAGNOSIS — M5481 Occipital neuralgia: Secondary | ICD-10-CM | POA: Insufficient documentation

## 2024-09-28 DIAGNOSIS — M792 Neuralgia and neuritis, unspecified: Secondary | ICD-10-CM | POA: Insufficient documentation

## 2024-09-28 MED ORDER — HYDROCODONE-ACETAMINOPHEN 5-325 MG PO TABS: 1.0000 | ORAL_TABLET | Freq: Two times a day (BID) | ORAL | 0 refills | Status: DC | PRN

## 2024-09-28 NOTE — Progress Notes (Signed)
 Safety precautions to be maintained throughout the outpatient stay will include: orient to surroundings, keep bed in low position, maintain call bell within reach at all times, provide assistance with transfer out of bed and ambulation.

## 2024-09-29 MED ORDER — HYDROCODONE-ACETAMINOPHEN 5-325 MG PO TABS: 1.0000 | ORAL_TABLET | Freq: Two times a day (BID) | ORAL | 0 refills | Status: AC | PRN

## 2024-10-11 ENCOUNTER — Encounter: Admitting: Nurse Practitioner

## 2024-10-17 ENCOUNTER — Ambulatory Visit: Payer: MEDICAID | Admitting: Clinical

## 2024-10-20 ENCOUNTER — Encounter: Payer: MEDICAID | Admitting: Nurse Practitioner

## 2024-10-31 ENCOUNTER — Ambulatory Visit: Admitting: Sleep Medicine

## 2024-11-03 ENCOUNTER — Ambulatory Visit: Payer: MEDICAID | Admitting: Cardiology

## 2024-11-14 ENCOUNTER — Ambulatory Visit: Payer: MEDICAID | Admitting: Psychiatry

## 2024-11-23 ENCOUNTER — Encounter: Payer: MEDICAID | Admitting: Nurse Practitioner

## 2024-11-28 ENCOUNTER — Ambulatory Visit: Payer: MEDICAID | Admitting: Psychiatry

## 2025-02-22 ENCOUNTER — Ambulatory Visit: Admitting: Internal Medicine
